# Patient Record
Sex: Male | Born: 1960 | Race: White | Hispanic: No | Smoking: Former smoker
Health system: Southern US, Community
[De-identification: ages and names within clinical notes are randomized; demographics above are authoritative.]

## PROBLEM LIST (undated history)

## (undated) DIAGNOSIS — R519 Headache, unspecified: Secondary | ICD-10-CM

## (undated) DIAGNOSIS — F810 Specific reading disorder: Secondary | ICD-10-CM

## (undated) DIAGNOSIS — I10 Essential (primary) hypertension: Secondary | ICD-10-CM

## (undated) DIAGNOSIS — J449 Chronic obstructive pulmonary disease, unspecified: Secondary | ICD-10-CM

## (undated) DIAGNOSIS — I219 Acute myocardial infarction, unspecified: Secondary | ICD-10-CM

## (undated) DIAGNOSIS — Z972 Presence of dental prosthetic device (complete) (partial): Secondary | ICD-10-CM

## (undated) DIAGNOSIS — I639 Cerebral infarction, unspecified: Secondary | ICD-10-CM

## (undated) HISTORY — PX: CAROTID ENDARTERECTOMY: SUR193

## (undated) HISTORY — PX: CORONARY ARTERY BYPASS GRAFT: SHX141

---

## 2010-02-10 ENCOUNTER — Inpatient Hospital Stay: Payer: Self-pay | Admitting: Internal Medicine

## 2010-02-17 ENCOUNTER — Ambulatory Visit: Payer: Self-pay | Admitting: Vascular Surgery

## 2010-02-25 ENCOUNTER — Ambulatory Visit: Payer: Self-pay | Admitting: Vascular Surgery

## 2010-03-02 ENCOUNTER — Inpatient Hospital Stay: Payer: Self-pay | Admitting: Vascular Surgery

## 2010-09-25 ENCOUNTER — Emergency Department: Payer: Self-pay | Admitting: Emergency Medicine

## 2010-12-18 DIAGNOSIS — I639 Cerebral infarction, unspecified: Secondary | ICD-10-CM

## 2010-12-18 HISTORY — DX: Cerebral infarction, unspecified: I63.9

## 2011-09-03 ENCOUNTER — Emergency Department (HOSPITAL_COMMUNITY)
Admission: EM | Admit: 2011-09-03 | Discharge: 2011-09-04 | Disposition: A | Discharge status: Other Acute Inpatient Hospital | Payer: Commercial Managed Care - PPO | Attending: Emergency Medicine | Admitting: Emergency Medicine

## 2011-09-03 DIAGNOSIS — I10 Essential (primary) hypertension: Secondary | ICD-10-CM | POA: Insufficient documentation

## 2011-09-03 DIAGNOSIS — F1010 Alcohol abuse, uncomplicated: Secondary | ICD-10-CM | POA: Insufficient documentation

## 2011-09-03 LAB — RAPID URINE DRUG SCREEN, HOSP PERFORMED
Amphetamines: NOT DETECTED
Barbiturates: NOT DETECTED
Benzodiazepines: NOT DETECTED
Cocaine: NOT DETECTED
Opiates: NOT DETECTED
Tetrahydrocannabinol: NOT DETECTED

## 2011-09-03 LAB — COMPREHENSIVE METABOLIC PANEL
ALT: 69 U/L — ABNORMAL HIGH (ref 0–53)
Albumin: 3.5 g/dL (ref 3.5–5.2)
Alkaline Phosphatase: 132 U/L — ABNORMAL HIGH (ref 39–117)
Calcium: 9.3 mg/dL (ref 8.4–10.5)
GFR calc Af Amer: >60 mL/min (ref >60)
Glucose, Bld: 107 mg/dL — ABNORMAL HIGH (ref 70–99)
Potassium: 5 mEq/L (ref 3.5–5.1)
Sodium: 137 mEq/L (ref 135–145)
Total Protein: 8.1 g/dL (ref 6.0–8.3)

## 2011-09-03 LAB — DIFFERENTIAL
Basophils Absolute: 0.1 10*3/uL (ref 0.0–0.1)
Eosinophils Absolute: 0.2 10*3/uL (ref 0.0–0.7)
Lymphs Abs: 1.7 10*3/uL (ref 0.7–4.0)
Monocytes Absolute: 1.5 10*3/uL — ABNORMAL HIGH (ref 0.1–1.0)
Monocytes Relative: 13 % — ABNORMAL HIGH (ref 3–12)
Neutro Abs: 7.7 10*3/uL (ref 1.7–7.7)

## 2011-09-03 LAB — CBC
MCH: 35.7 pg — ABNORMAL HIGH (ref 26.0–34.0)
MCHC: 36.4 g/dL — ABNORMAL HIGH (ref 30.0–36.0)
Platelets: 255 10*3/uL (ref 150–400)
RDW: 14.4 % (ref 11.5–15.5)

## 2011-09-04 ENCOUNTER — Inpatient Hospital Stay (HOSPITAL_COMMUNITY)
Admission: AD | Admit: 2011-09-04 | Discharge: 2011-09-07 | DRG: 897 | Disposition: A | Discharge status: Home / Self Care | Payer: Commercial Managed Care - PPO | Source: Ambulatory Visit | Attending: Psychiatry | Admitting: Psychiatry

## 2011-09-04 DIAGNOSIS — F17200 Nicotine dependence, unspecified, uncomplicated: Secondary | ICD-10-CM

## 2011-09-04 DIAGNOSIS — K219 Gastro-esophageal reflux disease without esophagitis: Secondary | ICD-10-CM

## 2011-09-04 DIAGNOSIS — Z79899 Other long term (current) drug therapy: Secondary | ICD-10-CM

## 2011-09-04 DIAGNOSIS — F1020 Alcohol dependence, uncomplicated: Secondary | ICD-10-CM

## 2011-09-04 DIAGNOSIS — I10 Essential (primary) hypertension: Secondary | ICD-10-CM

## 2011-09-14 NOTE — Discharge Summary (Signed)
[name], Calderon                ACCOUNT NO.:  [id number]  MEDICAL RECORD NO.:  [id number]  LOCATION:  0300                          FACILITY:  [facility]  PHYSICIAN:  Orson Aloe, MD       DATE OF BIRTH:  1961-05-03  DATE OF ADMISSION:  09/04/2011 DATE OF DISCHARGE:  09/07/2011                              DISCHARGE SUMMARY   IDENTIFYING INFORMATION:  This is a 50 year old Caucasian male.  This is a voluntary admission.  HISTORY OF PRESENT ILLNESS:  [name] presents after a friend encouraged him to come to help for his alcohol use.  He was getting diaphoretic at night had stopped eating and was substituting alcohol for food.  He reported using alcohol for the past 8 years with his use gradually escalating.  He says that he awakens in the middle of the night shaky and diaphoretic and has to drink to keep the night sweats and chills off.  Admitted that he needed help, said he was drinking around 12 beers or more most every day.  Also having trouble affording the amount of alcohol it took to keep the withdrawal symptoms away.  MEDICAL EVALUATION:  He was medically cleared in the emergency room where he was found to have a temperature of 98, pulse 116, blood pressure 184/123.  He reported a history of hypertension and had what he reported as a mini stroke at some point in the past.  His urine drug screen was negative for all substances.  Chemistry:  Normal.  Liver enzymes mildly elevated with SGOT 100, SGPT 69.  Alcohol screen was negative.  CBC with mildly elevated WBC 11.2, hemoglobin 19.4, MCV 98.2, platelets 255,000.  He denied any suicidal thoughts in the emergency room.  COURSE OF HOSPITALIZATION:  He was admitted to our detox unit and presented disheveled, flushed and in full detox.  He was started on Librium protocol with a goal of a safe detox in 4 days and was started on metoprolol 25 mg q.a.m. and q.h.s. to control his high blood pressure.  He tolerated the detox program  well and his detox was uneventful.  While on our unit his blood pressure gradually decreased and is 144/95 to 147/101 while on our unit, pulse ranging from 76-89. By the 20th he was fully detoxed and ready to leave.  PLAN:  Follow up at [facility] in [place] on September 12, 2011 at 3:00 p.m. with Dr. Maisie Fus for group therapy.  He will also attend 90 AA meetings in 90 days. He is to follow up with his primary medical doctor within 2 weeks for blood pressure check and medication evaluation for high blood pressure.  DISCHARGE DIAGNOSIS:  Axis I:  Alcohol dependence. Axis II: Deferred. Axis III: Hypertension, history of GERD. Axis IV: Moderate psychosocial issues related to substance abuse. Axis V: Current 55 past year not known.  DISCHARGE CONDITION:  Stable and improved.  DISCHARGE MEDICATIONS: 1. Metoprolol 25 mg q.a.m. and q.h.s. for high blood pressure 2. Multivitamin 1 tablet daily for vitamin supplementation. 3. Thiamine 100 mg daily for vitamin B1 supplementation. 4. Zantac 150 mg 1 tablet daily as needed for heartburn.  Margaret A. Lorin Picket, N.P.   ______________________________ Orson Aloe, MD    MAS/MEDQ  D:  09/07/2011  T:  09/07/2011  Job:  [id number]  Electronically Signed by Kari Baars N.P. on 09/13/2011 05:05:28 PM Electronically Signed by Orson Aloe  on 09/14/2011 10:53:29 AM

## 2011-09-21 ENCOUNTER — Ambulatory Visit: Payer: Self-pay | Admitting: Unknown Physician Specialty

## 2011-10-04 NOTE — Assessment & Plan Note (Signed)
[name], [name]                ACCOUNT NO.:  [id number]  MEDICAL RECORD NO.:  [id number]  LOCATION:  0300                          FACILITY:  [facility]  PHYSICIAN:  Orson Aloe, MD       DATE OF BIRTH:  September 13, 1961  DATE OF ADMISSION:  09/04/2011 DATE OF DISCHARGE:  09/07/2011                      PSYCHIATRIC ADMISSION ASSESSMENT   IDENTIFYING INFORMATION:  This is a 50 year old male.  This is a voluntary admission.  HISTORY OF PRESENT ILLNESS:  [name] presents after a friend encouraged him to come for help.  He knew that he was drinking too much alcohol and was finding himself getting diaphoretic at night; was not eating regularly; admits to substituting alcohol for meals and is requesting detox from alcohol.  He feels that his alcohol use is out of control, denying any suicidal thoughts.  He finds himself waking up, having to drink in the morning to calm down;  gets diaphoretic at night when he cannot drink.  This disrupts his sleep.  He has been drinking beer for about six years now with gradual escalation and use.  Currently, he is not attending A.A. meetings or getting outpatient treatment.  PAST PSYCHIATRIC HISTORY:  He has received no previous treatment for alcohol whatsoever; no history of previous psychiatric admissions.  SOCIAL HISTORY:  Currently is employed and endorses some co-worker conflicts at work.  Says his money is tight, but he is able to get by on his current income.  He separated from his wife in 2004, divorced in 2006, and his two children, age 25 and 36.  Endorses a family history of his father intoxicated on alcohol much of his life.  Completed education through the eighth grade.  He is employed in [employer] and reports drinking about 18 beers daily.  Most recently, for at least two weeks, he is drinking constantly whenever he is not at work.  He has a history of four previous DWI charges.  MEDICAL ISSUES:  A history of hypertension, but he  is off all medications; has not been eating and drinking well.  Denies a history of seizures.  No primary care physician.  A full Review of Systems and physical exam were done in the Emergency Room.  The physical exam is noted there.  A urine drug screen was noted to be negative for all substances.  CBC:  WBC 11.2, hemoglobin of 19.4, hematocrit 53.3, and platelets 255000.  Alcohol screen was negative. Chemistry revealed normal chemistry; BUN 6, creatinine 0.79.  Liver enzymes elevated:  SGOT 100, SGPT 69, alkaline phosphatase 132, and total bilirubin 1.4.  Random glucose 107.  The patient was one labetalol in the Emergency Room to control his blood pressure.  MENTAL STATUS EXAM:  A fully alert male, pleasant, cooperative, denying any dangerous ideas.  Thinking is logical; no delusional statements; recognizes that alcohol is affecting his life.  He cannot function on it any more.  Insight is adequate; impulse control and judgment normal.  ADMITTING DIAGNOSIS:  Axis I:  Alcohol dependence. Axis II:  No diagnosis. Axis III:  Hypertension. Axis IV:  Moderate social stressors due to his drinking and moderate functional impairment, also due to drinking. Axis V:  Current 43; past year, not known.  The plan is to voluntarily admit him.  We are going to start him on metoprolol 25 mg q.a.m. and q.h.s. for his hypertension.  He is also started on a Librium detox protocol with the goal of a safe detox in four days.     Margaret A. Lorin Picket, N.P.   ______________________________ Orson Aloe, MD    MAS/MEDQ  D:  10/02/2011  T:  10/02/2011  Job:  [id number]  Electronically Signed by Kari Baars N.P. on 10/03/2011 12:53:55 PM Electronically Signed by Orson Aloe  on 10/04/2011 02:53:42 PM

## 2011-10-19 ENCOUNTER — Ambulatory Visit: Payer: Self-pay | Admitting: Unknown Physician Specialty

## 2012-05-09 DIAGNOSIS — I219 Acute myocardial infarction, unspecified: Secondary | ICD-10-CM

## 2012-05-09 HISTORY — DX: Acute myocardial infarction, unspecified: I21.9

## 2012-05-10 HISTORY — PX: CORONARY ARTERY BYPASS GRAFT: SHX141

## 2013-02-05 ENCOUNTER — Emergency Department: Payer: Self-pay | Admitting: Emergency Medicine

## 2013-02-05 LAB — COMPREHENSIVE METABOLIC PANEL WITH GFR
Albumin: 3.8 g/dL
Alkaline Phosphatase: 83 U/L
Anion Gap: 9
BUN: 4 mg/dL — ABNORMAL LOW
Bilirubin,Total: 0.4 mg/dL
Calcium, Total: 8.4 mg/dL — ABNORMAL LOW
Chloride: 104 mmol/L
Co2: 26 mmol/L
Creatinine: 0.81 mg/dL
EGFR (African American): >60
EGFR (Non-African Amer.): >60
Glucose: 127 mg/dL — ABNORMAL HIGH
Osmolality: 276
Potassium: 3.7 mmol/L
SGOT(AST): 63 U/L — ABNORMAL HIGH
SGPT (ALT): 91 U/L — ABNORMAL HIGH
Sodium: 139 mmol/L
Total Protein: 8.2 g/dL

## 2013-02-05 LAB — URINALYSIS, COMPLETE
Bacteria: NONE SEEN
Bilirubin,UR: NEGATIVE
Blood: NEGATIVE
Glucose,UR: NEGATIVE mg/dL
Hyaline Cast: 1
Ketone: NEGATIVE
Leukocyte Esterase: NEGATIVE
Nitrite: NEGATIVE
Ph: 5
Protein: NEGATIVE
RBC,UR: NONE SEEN /HPF
Specific Gravity: 1.01
Squamous Epithelial: <1
WBC UR: <1 /HPF

## 2013-02-05 LAB — CK TOTAL AND CKMB (NOT AT ARMC): CK-MB: 2 ng/mL (ref 0.5–3.6)

## 2013-02-05 LAB — TROPONIN I: Troponin-I: <0.02 ng/mL

## 2013-02-05 LAB — CBC
HCT: 52.3 % — ABNORMAL HIGH (ref 40.0–52.0)
HGB: 17.2 g/dL (ref 13.0–18.0)
MCV: 102 fL — ABNORMAL HIGH (ref 80–100)
Platelet: 263 10*3/uL (ref 150–440)
RDW: 15.3 % — ABNORMAL HIGH (ref 11.5–14.5)

## 2013-02-05 LAB — PROTIME-INR: INR: 0.9

## 2013-06-10 ENCOUNTER — Emergency Department: Payer: Self-pay | Admitting: Emergency Medicine

## 2013-06-10 LAB — URINALYSIS, COMPLETE
Bacteria: NONE SEEN
Bilirubin,UR: NEGATIVE
Glucose,UR: NEGATIVE mg/dL (ref 0–75)
Leukocyte Esterase: NEGATIVE
Protein: 30
Squamous Epithelial: NONE SEEN

## 2013-06-10 LAB — BASIC METABOLIC PANEL
Calcium, Total: 8.8 mg/dL (ref 8.5–10.1)
Chloride: 102 mmol/L (ref 98–107)
EGFR (African American): >60
Potassium: 4.2 mmol/L (ref 3.5–5.1)

## 2013-06-10 LAB — CBC WITH DIFFERENTIAL/PLATELET
Eosinophil #: 0.1 10*3/uL (ref 0.0–0.7)
Eosinophil %: 1 %
Lymphocyte #: 1.4 10*3/uL (ref 1.0–3.6)
MCH: 35 pg — ABNORMAL HIGH (ref 26.0–34.0)
MCHC: 34.5 g/dL (ref 32.0–36.0)
MCV: 102 fL — ABNORMAL HIGH (ref 80–100)
Monocyte #: 1.4 x10 3/mm — ABNORMAL HIGH (ref 0.2–1.0)
Monocyte %: 10.2 %
Neutrophil %: 77.7 %
WBC: 13.8 10*3/uL — ABNORMAL HIGH (ref 3.8–10.6)

## 2013-12-02 LAB — DRUG SCREEN, URINE
Amphetamines, Ur Screen: NEGATIVE (ref ?–1000)
Benzodiazepine, Ur Scrn: NEGATIVE (ref ?–200)
Cannabinoid 50 Ng, Ur ~~LOC~~: NEGATIVE (ref ?–50)
Cocaine Metabolite,Ur ~~LOC~~: NEGATIVE (ref ?–300)
MDMA (Ecstasy)Ur Screen: NEGATIVE (ref ?–500)
Methadone, Ur Screen: NEGATIVE (ref ?–300)
Opiate, Ur Screen: NEGATIVE (ref ?–300)
Phencyclidine (PCP) Ur S: NEGATIVE (ref ?–25)
Tricyclic, Ur Screen: NEGATIVE (ref ?–1000)

## 2013-12-02 LAB — URINALYSIS, COMPLETE
Bilirubin,UR: NEGATIVE
Blood: NEGATIVE
Glucose,UR: NEGATIVE mg/dL (ref 0–75)
Leukocyte Esterase: NEGATIVE
Ph: 5 (ref 4.5–8.0)
Protein: NEGATIVE
RBC,UR: NONE SEEN /HPF (ref 0–5)
Specific Gravity: 1.003 (ref 1.003–1.030)
Squamous Epithelial: NONE SEEN
WBC UR: NONE SEEN /HPF (ref 0–5)

## 2013-12-02 LAB — COMPREHENSIVE METABOLIC PANEL
Albumin: 3.5 g/dL (ref 3.4–5.0)
Anion Gap: 3 — ABNORMAL LOW (ref 7–16)
BUN: 4 mg/dL — ABNORMAL LOW (ref 7–18)
Co2: 29 mmol/L (ref 21–32)
Osmolality: 270 (ref 275–301)
Potassium: 3.8 mmol/L (ref 3.5–5.1)
SGPT (ALT): 111 U/L — ABNORMAL HIGH (ref 12–78)
Sodium: 137 mmol/L (ref 136–145)
Total Protein: 7.8 g/dL (ref 6.4–8.2)

## 2013-12-02 LAB — CBC
HGB: 16.4 g/dL (ref 13.0–18.0)
MCHC: 34.6 g/dL (ref 32.0–36.0)
RDW: 14.1 % (ref 11.5–14.5)

## 2013-12-02 LAB — ETHANOL
Ethanol %: 0.116 % — ABNORMAL HIGH (ref 0.000–0.080)
Ethanol: 284 mg/dL

## 2013-12-03 ENCOUNTER — Inpatient Hospital Stay: Payer: Self-pay | Admitting: Psychiatry

## 2013-12-03 LAB — LIPID PANEL
HDL Cholesterol: 49 mg/dL (ref 40–60)
Triglycerides: 166 mg/dL (ref 0–200)

## 2013-12-03 LAB — HEMOGLOBIN A1C: Hemoglobin A1C: 5.3 % (ref 4.2–6.3)

## 2013-12-04 DIAGNOSIS — I517 Cardiomegaly: Secondary | ICD-10-CM

## 2013-12-27 ENCOUNTER — Emergency Department: Payer: Self-pay | Admitting: Emergency Medicine

## 2013-12-27 LAB — CBC
HCT: 43.9 % (ref 40.0–52.0)
HGB: 14.7 g/dL (ref 13.0–18.0)
MCH: 33.3 pg (ref 26.0–34.0)
MCHC: 33.5 g/dL (ref 32.0–36.0)
MCV: 99 fL (ref 80–100)
Platelet: 298 10*3/uL (ref 150–440)
RBC: 4.41 10*6/uL (ref 4.40–5.90)
RDW: 14 % (ref 11.5–14.5)
WBC: 11.8 10*3/uL — ABNORMAL HIGH (ref 3.8–10.6)

## 2013-12-27 LAB — URINALYSIS, COMPLETE
BACTERIA: NONE SEEN
BILIRUBIN, UR: NEGATIVE
Blood: NEGATIVE
Glucose,UR: NEGATIVE mg/dL (ref 0–75)
Ketone: NEGATIVE
LEUKOCYTE ESTERASE: NEGATIVE
NITRITE: NEGATIVE
Ph: 6 (ref 4.5–8.0)
Protein: 100
SPECIFIC GRAVITY: 1.017 (ref 1.003–1.030)
Squamous Epithelial: 2

## 2013-12-27 LAB — COMPREHENSIVE METABOLIC PANEL
ALBUMIN: 3.3 g/dL — AB (ref 3.4–5.0)
ALK PHOS: 61 U/L
ALT: 58 U/L (ref 12–78)
ANION GAP: 7 (ref 7–16)
BILIRUBIN TOTAL: 0.4 mg/dL (ref 0.2–1.0)
BUN: 6 mg/dL — AB (ref 7–18)
CALCIUM: 7.8 mg/dL — AB (ref 8.5–10.1)
CHLORIDE: 104 mmol/L (ref 98–107)
CO2: 27 mmol/L (ref 21–32)
Creatinine: 1.23 mg/dL (ref 0.60–1.30)
EGFR (Non-African Amer.): >60
Glucose: 95 mg/dL (ref 65–99)
OSMOLALITY: 273 (ref 275–301)
POTASSIUM: 4.1 mmol/L (ref 3.5–5.1)
SGOT(AST): 28 U/L (ref 15–37)
SODIUM: 138 mmol/L (ref 136–145)
TOTAL PROTEIN: 6.5 g/dL (ref 6.4–8.2)

## 2013-12-27 LAB — DRUG SCREEN, URINE
Amphetamines, Ur Screen: NEGATIVE (ref ?–1000)
Barbiturates, Ur Screen: NEGATIVE (ref ?–200)
Benzodiazepine, Ur Scrn: NEGATIVE (ref ?–200)
COCAINE METABOLITE, UR ~~LOC~~: NEGATIVE (ref ?–300)
Cannabinoid 50 Ng, Ur ~~LOC~~: NEGATIVE (ref ?–50)
MDMA (ECSTASY) UR SCREEN: NEGATIVE (ref ?–500)
METHADONE, UR SCREEN: NEGATIVE (ref ?–300)
OPIATE, UR SCREEN: NEGATIVE (ref ?–300)
Phencyclidine (PCP) Ur S: NEGATIVE (ref ?–25)
Tricyclic, Ur Screen: NEGATIVE (ref ?–1000)

## 2013-12-27 LAB — ETHANOL
ETHANOL %: 0.231 % — AB (ref 0.000–0.080)
ETHANOL LVL: 231 mg/dL

## 2013-12-27 LAB — CK TOTAL AND CKMB (NOT AT ARMC)
CK, TOTAL: 47 U/L (ref 35–232)
CK-MB: 1 ng/mL (ref 0.5–3.6)

## 2013-12-27 LAB — TROPONIN I
Troponin-I: <0.02 ng/mL
Troponin-I: <0.02 ng/mL

## 2013-12-27 LAB — AMMONIA: AMMONIA, PLASMA: 24 umol/L (ref 11–32)

## 2014-08-19 ENCOUNTER — Emergency Department: Payer: Self-pay | Admitting: Emergency Medicine

## 2014-09-09 ENCOUNTER — Emergency Department: Payer: Self-pay | Admitting: Emergency Medicine

## 2014-09-09 LAB — CBC
HCT: 51.6 % (ref 40.0–52.0)
HGB: 17.5 g/dL (ref 13.0–18.0)
MCH: 33.7 pg (ref 26.0–34.0)
MCHC: 34 g/dL (ref 32.0–36.0)
MCV: 99 fL (ref 80–100)
PLATELETS: 228 10*3/uL (ref 150–440)
RBC: 5.2 10*6/uL (ref 4.40–5.90)
RDW: 13.8 % (ref 11.5–14.5)
WBC: 9.1 10*3/uL (ref 3.8–10.6)

## 2014-09-09 LAB — COMPREHENSIVE METABOLIC PANEL
ALT: 60 U/L
AST: 47 U/L — AB (ref 15–37)
Albumin: 3.8 g/dL (ref 3.4–5.0)
Alkaline Phosphatase: 66 U/L
Anion Gap: 7 (ref 7–16)
BUN: 7 mg/dL (ref 7–18)
Bilirubin,Total: 0.4 mg/dL (ref 0.2–1.0)
CO2: 29 mmol/L (ref 21–32)
Calcium, Total: 8.4 mg/dL — ABNORMAL LOW (ref 8.5–10.1)
Chloride: 102 mmol/L (ref 98–107)
Creatinine: 0.7 mg/dL (ref 0.60–1.30)
Glucose: 84 mg/dL (ref 65–99)
OSMOLALITY: 273 (ref 275–301)
POTASSIUM: 4 mmol/L (ref 3.5–5.1)
Sodium: 138 mmol/L (ref 136–145)
TOTAL PROTEIN: 8.1 g/dL (ref 6.4–8.2)

## 2014-09-09 LAB — DRUG SCREEN, URINE
AMPHETAMINES, UR SCREEN: NEGATIVE (ref ?–1000)
BARBITURATES, UR SCREEN: NEGATIVE (ref ?–200)
Benzodiazepine, Ur Scrn: NEGATIVE (ref ?–200)
CANNABINOID 50 NG, UR ~~LOC~~: NEGATIVE (ref ?–50)
COCAINE METABOLITE, UR ~~LOC~~: NEGATIVE (ref ?–300)
MDMA (Ecstasy)Ur Screen: NEGATIVE (ref ?–500)
Methadone, Ur Screen: NEGATIVE (ref ?–300)
Opiate, Ur Screen: NEGATIVE (ref ?–300)
Phencyclidine (PCP) Ur S: NEGATIVE (ref ?–25)
Tricyclic, Ur Screen: NEGATIVE (ref ?–1000)

## 2014-09-09 LAB — URINALYSIS, COMPLETE
BLOOD: NEGATIVE
Bacteria: NONE SEEN
Bilirubin,UR: NEGATIVE
Glucose,UR: NEGATIVE mg/dL (ref 0–75)
KETONE: NEGATIVE
Leukocyte Esterase: NEGATIVE
NITRITE: NEGATIVE
Ph: 5 (ref 4.5–8.0)
Protein: NEGATIVE
RBC,UR: NONE SEEN /HPF (ref 0–5)
SQUAMOUS EPITHELIAL: NONE SEEN
Specific Gravity: 1.011 (ref 1.003–1.030)

## 2014-09-09 LAB — SALICYLATE LEVEL: SALICYLATES, SERUM: 2.8 mg/dL

## 2014-09-09 LAB — ACETAMINOPHEN LEVEL

## 2014-09-09 LAB — ETHANOL: Ethanol: 110 mg/dL

## 2014-12-02 ENCOUNTER — Emergency Department: Payer: Self-pay | Admitting: Emergency Medicine

## 2014-12-02 LAB — BASIC METABOLIC PANEL
ANION GAP: 7 (ref 7–16)
BUN: 6 mg/dL — AB (ref 7–18)
CALCIUM: 8.4 mg/dL — AB (ref 8.5–10.1)
Chloride: 98 mmol/L (ref 98–107)
Co2: 29 mmol/L (ref 21–32)
Creatinine: 0.68 mg/dL (ref 0.60–1.30)
Glucose: 79 mg/dL (ref 65–99)
Osmolality: 265 (ref 275–301)
Potassium: 3.7 mmol/L (ref 3.5–5.1)
Sodium: 134 mmol/L — ABNORMAL LOW (ref 136–145)

## 2014-12-02 LAB — CBC
HCT: 49 % (ref 40.0–52.0)
HGB: 16.2 g/dL (ref 13.0–18.0)
MCH: 33 pg (ref 26.0–34.0)
MCHC: 33.1 g/dL (ref 32.0–36.0)
MCV: 100 fL (ref 80–100)
Platelet: 196 10*3/uL (ref 150–440)
RBC: 4.92 10*6/uL (ref 4.40–5.90)
RDW: 13.9 % (ref 11.5–14.5)
WBC: 8.8 10*3/uL (ref 3.8–10.6)

## 2014-12-02 LAB — ETHANOL: ETHANOL LVL: 12 mg/dL

## 2014-12-02 LAB — TROPONIN I: Troponin-I: <0.02 ng/mL

## 2015-02-10 ENCOUNTER — Emergency Department: Payer: Self-pay | Admitting: Emergency Medicine

## 2015-02-17 ENCOUNTER — Emergency Department: Payer: Self-pay | Admitting: Emergency Medicine

## 2015-04-09 NOTE — H&P (Signed)
PATIENT NAME:  [name], [name] MR#:  [id number] DATE OF BIRTH:  1961-10-02  DATE OF ADMISSION:  12/02/2013  IDENTIFYING INFORMATION AND CHIEF COMPLAINT: A 54 year old man presented himself to the Emergency Room, referred by his boss.   CHIEF COMPLAINT: "I've been drinking."   HISTORY OF PRESENT ILLNESS: Information obtained from the patient and the chart. The patient went to work today, where he was apparently unable to do his job because he had been drinking. He admits that he drinks on a daily basis.  It has probably been getting a little worse. He drinks the least a quart of wine and several hard lemonades and also drinks beer in the morning before he goes to work. Sleep is intermittent. Mood is kind of flat. Denies suicidal ideation although he has had some passive thoughts about dying when he is intoxicated. He tells me that he wants to stop drinking because he knows that it is making it harder for him to work and making his mood worse. Denies abuse of any other drugs.   PAST PSYCHIATRIC HISTORY: No previous psychiatric treatment. No psychiatric hospitalizations. Denies any history of suicide attempts. Denies any history of psychiatric medication. He says that he has tried to go to outpatient substance abuse treatment but he gets too anxious being around groups of other people. He apparently has not gotten any treatment for an anxiety disorder. Denies other substance abuse.   SOCIAL HISTORY: He lives alone. He and his wife separated within the last year. He works as a Freight forwarder. Sounds fairly lonely, does not have a whole lot of other social activity. All the rest of his family are deceased. Adult children do not stay in touch with him.   PAST MEDICAL HISTORY: He tells me he had a heart attack at one point in the past. He also has chronic shortness of breath and wheezing but does not get any treatment for it.   SUBSTANCE ABUSE HISTORY: Alcohol use that has been up and down, recently  escalating. Says that he may have had a seizure one time in the past, he is kind of unclear about it. He does not really seem to have ever had delirium tremens. Says he is not abusing any other drugs.   FAMILY HISTORY: No family history identified.   CURRENT MEDICATIONS: None.   ALLERGIES: No known drug allergies.   REVIEW OF SYSTEMS: Feeling a little bit tired and run down. Mildly shaky. Anxious. Not overwhelmingly depressed. No suicidal or homicidal ideation. No hallucinations. No other acute physical symptoms.   MENTAL STATUS EXAMINATION:  The patient was alert and cooperative. Eye contact good. Psychomotor activity a little bit slow. Speech a little bit slow. Thoughts appear to be a little bit slow as well. Speech otherwise easy to understand. Affect blunted. Mood stated as nervous. Thoughts are lucid but again slow. No delusions or bizarre statements. Does not appear to be hallucinating. Denies suicidal or homicidal ideation. Alert and oriented x 4. Shows reasonably good judgment and insight.   PHYSICAL EXAMINATION: SKIN: No acute skin lesions.  HEENT: Pupils equal and reactive. Face symmetric.  MUSCULOSKELETAL:  Gait within normal limits. Full range of motion at all extremities.  NECK AND BACK: Nontender.  NEURO:  Strength and reflexes normal and symmetric throughout. Cranial nerves symmetric and normal.  LUNGS: Diffuse light wheezing throughout.  HEART: Regular rate and rhythm.  ABDOMEN: Soft, nontender, normal bowel sounds.  VITAL SIGNS: Temperature 98.9, pulse most recently 94, blood pressure 150/90, respirations 20.  LABORATORY RESULTS: His last alcohol level was last night at about 10:00 and was 116. Salicylates slightly up but not toxic at 3.8. Chemistry shows an AST and ALT mildly elevated. CBC unremarkable. Urinalysis normal. Drug screen negative.   ASSESSMENT: A 54 year old man who appears to have alcohol dependence. Possible history of seizure in the past. Significant impact  on his social life and his work life. Has been unable to stop with outpatient treatment. Also describes what probably sounds like social anxiety disorder chronically. Reasonable candidate for admission for detox to improve chances of sobriety.   TREATMENT PLAN: Admit to psychiatry. Detox orders in place. Medication added to treat his blood pressure since he said he was supposed to be on that. He has never had his COPD treated so I am going to put in a p.r.n. albuterol but also ask the internal medicine doctors for advice about managing it. I am going to start him on Prozac because I think he probably does have social anxiety disorder. Will engage him in the groups and individual counseling.   DIAGNOSIS, PRINCIPAL AND PRIMARY:  AXIS I: Alcohol dependence.   SECONDARY DIAGNOSES: AXIS I: Social anxiety disorder.  AXIS II: Deferred.  AXIS III: High blood pressure, alcohol withdrawal.  AXIS IV: Severe from the effect here on his health and social life.  AXIS V: Functioning at time of evaluation 35.     ____________________________ Gonzella Lex, MD jtc:cs D: 12/03/2013 17:48:36 ET T: 12/03/2013 18:41:11 ET JOB#: [id number]  cc: Gonzella Lex, MD, <Dictator> Gonzella Lex MD ELECTRONICALLY SIGNED 12/04/2013 10:39

## 2015-04-09 NOTE — Consult Note (Signed)
Brief Consult Note: Diagnosis: COPD exacerbation, CAD, High Blood Pressure (Hypertension).   Patient was seen by consultant.   Consult note dictated.   Orders entered.   Discussed with Attending MD.   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 Medical consult requested for wheezing and dsypnea  * Acute on chronic COPD exacerbation- start prednisone taper, symbicort, spiriva for now check o2 sats  * CAD status post CABG- not taking any meds at home start on asa, coreg, statin will get ECHO to see EF  * Ac abuse- CIWA and detox per psych  * Tobacco abuse diosrder- counselled for 33min, smokes 3ppd, nicotral inh  * HTN- will start meds.  Electronic Signatures: Gladstone Lighter (MD)  (Signed 17-Dec-14 18:05)  Authored: Brief Consult Note   Last Updated: 17-Dec-14 18:05 by Gladstone Lighter (MD)

## 2015-04-09 NOTE — Consult Note (Signed)
Consult: outpatient treatment recommendations   Patient seen as requested of Attending MD prior to this inpatient discharge in order to explore Outpatient Rehab at discharge from this admission for patient's abuse of Alcoholic beverage. Patient was positive for soberity goals in the past and verbally admitted to the need for outpatient treatment in order to develop recovery skills due to his goal of complete abstinence of addictive substances. He reported that his attendance in the [facility] CD-IOP may be problematic due to his work schedule and that he will seek to work out his schedule so that he can attend the CD-IOP. was positive for past treatment in the [facility] CD-IOP. He expressed plans of life time sober living and will report for the CD-IOP on Monday December 08, 2013 to begin the [facility] CD-IOP.   Electronic Signatures: Laqueta Due (PsyD)  (Signed on 19-Dec-14 22:21)  Authored  Last Updated: 19-Dec-14 22:21 by Laqueta Due (PsyD)

## 2015-04-09 NOTE — Consult Note (Signed)
Brief Consult Note: Diagnosis: alcohol dep.   Patient was seen by consultant.   Consult note dictated.   Recommend further assessment or treatment.   Orders entered.   Comments: Psychiatry: Admitted to inpatient unit. See full note.  Electronic Signatures: Karsten Vaughn, Madie Reno (MD)  (Signed 17-Dec-14 17:41)  Authored: Brief Consult Note   Last Updated: 17-Dec-14 17:41 by Gonzella Lex (MD)

## 2015-04-09 NOTE — Discharge Summary (Signed)
PATIENT NAME:  [name], [name] MR#:  [id number] DATE OF BIRTH:  10/15/1961  DATE OF ADMISSION:  12/03/2013 DATE OF DISCHARGE:  12/05/2013  HOSPITAL COURSE:  See dictated history and physical for details of admission.  A 54 year old man came into the hospital reporting that he had been drinking too much.  He had been drinking on a daily basis and it has been getting out of control and he had been sent here from work telling him he needed to get off the alcohol.  He was admitted voluntarily and was treated with detox protocol.  The patient did not have a seizure and does not appear to be showing any signs of delirium.  He is not tremulous and he is eating well.  He does run a very high blood pressure, which seems to probably be untreated hypertension.  As far as the substance abuse issue he has participated in groups and expressed insight into the need to stop drinking.  He has been educated about treatment for alcohol dependence.  Unfortunately his insurance is not accepted by the local provider of substance abuse services outpatient, but he may be a candidate for the intensive outpatient program here if he would agree to it.  We hope to have the director of the program speak to the patient before discharge.  If that does not work out he certainly needs to be going to [employer] regularly.  He also has been strongly advised to get a primary care doctor for follow-up.  Internal medicine consult has seen the patient.  He has high blood pressure and COPD.  He has been started on several medications, but certainly needs follow-up.  The patient is denying suicidal ideation and appears to be safe, calm and lucid.   DISCHARGE MEDICATIONS:  Prednisone taper that should go 50 mg a day for the next two days, then 40 mg a day for two days, then 30 mg a day for two days, then 20 mg a day for two days, then 10 mg a day for two days.  I have written the prescription out by hand to try to make this clear.  Lisinopril 10 mg once  a day, fluoxetine 20 mg once a day, simvastatin 20 mg at night, aspirin 81 mg once a day, carvedilol 6.25 mg 2 times a day, albuterol ipratropium inhaler 1 puff 4 times a day, budesonide formoterol 160 mcg inhaler 1 puff 2 times a day, tiotropium 18 mg inhaled capsule once a day, magnesium oxide 400 mg a day, pantoprazole 40 mg a day, hydralazine 25 mg 3 times a day.   MENTAL STATUS EXAMINATION AT DISCHARGE:  Casually dressed, neatly groomed man, looks his stated age or older.  Cooperative with the interview.  Good eye contact, normal psychomotor activity.  Speech normal rate, tone and volume.  Affect euthymic, reactive, appropriate.  Mood stated as fine.  Thoughts are lucid.  No loosening of associations.  No delusions.  Denies auditory or visual hallucinations.  Denies suicidal or homicidal ideation.  Shows improved judgment and insight.  Normal intelligence.  Alert and oriented x 4.   LABORATORY RESULTS:  Chemistries on admission included drug screen that was negative.  Chemistry panel with elevated ALT at 111 and AST at 88.  Alcohol level 284, cholesterol 207 with LDL 125.  Hemoglobin A1c normal.  CBC unremarkable.  Urinalysis unremarkable.  Echocardiogram done.  See the chart for full details, but it shows a normal ejection fraction, mild concentric left ventricular hypertrophy, but no  valve abnormalities.   DISPOSITION:  Discharge home.  We will try and get him to follow up at the IOP program.  If that cannot be arranged go to [place] and also get a primary care doctor.   DIAGNOSIS, PRINCIPAL AND PRIMARY:  AXIS I:  Alcohol dependence.   SECONDARY DIAGNOSES: AXIS I:  Social anxiety disorder.  AXIS II:  Deferred.  AXIS III:  Alcohol withdrawal, hypertension, chronic obstructive pulmonary disease, elevated cholesterol.  AXIS IV:  Severe from social isolation and recent losses.  AXIS V:  Functioning at time of discharge 46.    ____________________________ Gonzella Lex, MD jtc:ea D: 12/05/2013  13:43:37 ET T: 12/05/2013 22:47:10 ET JOB#: [id number]  cc: Gonzella Lex, MD, <Dictator> Gonzella Lex MD ELECTRONICALLY SIGNED 12/08/2013 12:35

## 2015-04-10 NOTE — Consult Note (Signed)
Brief Consult Note: Diagnosis: alcohol abuse.   Patient was seen by consultant.   Consult note dictated.   Recommend further assessment or treatment.   Comments: Psychiatry: Patient seen and chart reviewed and note dictated. REfer to SA treatment.  Electronic Signatures: Clapacs, Madie Reno (MD)  (Signed 24-Sep-15 19:35)  Authored: Brief Consult Note   Last Updated: 24-Sep-15 19:35 by Gonzella Lex (MD)
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
## 2015-04-10 NOTE — Consult Note (Signed)
PATIENT NAME:  [name], [name] MR#:  [id number] DATE OF BIRTH:  1961/11/12  DATE OF CONSULTATION:  09/10/2014  CONSULTING PHYSICIAN:  Gonzella Lex, MD  IDENTIFYING INFORMATION AND REASON FOR CONSULTATION: This is a 54 year old male with a history of alcohol abuse, who came to the hospital requesting detoxification.   CHIEF COMPLAINT: "I need help."    HISTORY OF PRESENT ILLNESS: Information obtained from the patient and the chart. The patient states that he has been drinking heavily recently. He has been drinking for years and never really has managed to stay sober for a long period, recently it has increased to where he is drinking a bottle of wine and several large size cans of beer every day. He denies that he is having any suicidal or homicidal ideation, not feeling depressed. He gives no particular reason for why his drinking is increasing. Feels like it has gotten out of control and gets in the way of his being able to work, wants to stop and get detoxed.   PAST PSYCHIATRIC HISTORY: The patient denies any history of DTs or history of seizures coming off of alcohol. He is not currently seeing anyone for psychiatric treatment. Never been in a psychiatric hospital that he can recall. No history of medication treatment or suicide attempts.   FAMILY HISTORY: Denies any family history of mental illness or substance abuse.   SOCIAL HISTORY: The patient is divorced. Lives by himself. Does work regularly. Does not have much support from any family around.   PAST MEDICAL HISTORY: History of a stroke in the past, also a history of a CABG and coronary artery disease.   CURRENT MEDICATIONS: Denies that he is taking any prescription medicines; that he used to take something for high blood pressure, but then his blood pressure started to drop low.   ALLERGIES: No known drug allergies.   REVIEW OF SYSTEMS: Denies feeling particularly shaky, not sick to his stomach. Not depressed. No suicidal ideation.  Feels a little bit sluggish and run down. Otherwise, review of systems 9 points all negative.   MENTAL STATUS EXAMINATION: Disheveled gentleman, looks older than his stated age. Passively cooperative. Eye contact good. Psychomotor activity slow. Speech slow and decreased in amount. Affect flat. Mood stated as okay. Thoughts are generally lucid, but slow, decreased in amount. Does not make any bizarre or delusional statements. Denies hallucinations. Denies suicidal or homicidal ideation. He could recall 3/3 objects immediately and 2/3 at 1 minute. He was alert and oriented x4. Judgment and insight seem adequate.   VITAL SIGNS: Blood pressure 176/107, respirations 18, pulse 97, temperature 98.   LABORATORY RESULTS: Chemistry panel: Slightly elevated AST at 47. Alcohol level on admission yesterday 110. Drug screen all negative. CBC is unremarkable. Urinalysis is unremarkable.   ASSESSMENT: A 54 year old man with alcohol abuse. No history of seizures or delirium tremens.  Mildly depressed and anxious. Has been doing poorly at work. Seeking some kind of substance abuse treatment. Does not necessarily meet criteria for inpatient hospitalization.   TREATMENT PLAN: The patient can be referred to the alcohol and drug abuse treatment center or other facilities covered by his insurance tomorrow. If we can find a place for him in the next day, we can discharge him.  Otherwise, he may able to just go home at this point. Continue CIWA protocol and current medications.  Continue current assessment.   DIAGNOSIS, PRINCIPAL AND PRIMARY:  AXIS I: Alcohol abuse.   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  AXIS II: Deferred.   AXIS III: High blood pressure, history of coronary artery disease.   ____________________________ Gonzella Lex, MD jtc:lr D: 09/10/2014 19:40:53 ET T: 09/10/2014 20:38:30 ET JOB#: [id number]  cc: Gonzella Lex, MD, <Dictator> Gonzella Lex  MD ELECTRONICALLY SIGNED 09/11/2014 23:13

## 2015-04-18 NOTE — Consult Note (Signed)
PATIENT NAME:  [name], [name] MR#:  [id number] DATE OF BIRTH:  1961-02-21  DATE OF CONSULTATION:  02/11/2015  REFERRING PHYSICIAN:   CONSULTING PHYSICIAN:  Gonzella Lex, MD   IDENTIFYING INFORMATION AND REASON FOR CONSULT:  This is a 54 year old man who came voluntarily to the Emergency Room.   CHIEF COMPLAINT: "I need to stop drinking."   HISTORY OF PRESENT ILLNESS: Information obtained from the patient and the chart. The patient presented to the Emergency Room yesterday very intoxicated, saying that he has decided that he needs to stop drinking.  He says he has been drinking about the same amount as usual, which is 4 to 5 very large-sized beers a day. He is not using any other drugs.  He is still managing to work, doing some painting on a regular basis. He denies that he is having any symptoms of depression. Denies suicidal or homicidal ideation. Denies any hallucinations. He does not present any really specific reason why he is coming in yesterday instead of any other day to stop drinking.   PAST PSYCHIATRIC HISTORY:  No history of suicide attempts or violence. He has been in the hospital several times for alcohol detoxification. No history of seizures or DTs. He has been referred to [facility] and other programs in the past, but has not been able to maintain a long term sobriety in quite a while.   FAMILY HISTORY: None known.   SOCIAL HISTORY: Lives by himself. Works doing Retail buyer.   PAST MEDICAL HISTORY: He has high blood pressure, but is not taking his medication just because he does not bother to pick it up.   CURRENT MEDICATIONS: None.   ALLERGIES: No known drug allergies.   REVIEW OF SYSTEMS: The patient denies feeling shaky or nauseous or unsteady.  He denies hallucinations. No specific physical complaints right now. No mood complaints.   MENTAL STATUS EXAMINATION:  This is a reasonably well-groomed gentleman, looks his stated age, was cooperative with the interview. Eye contact  good. Psychomotor activity normal without any tremor. Speech normal rate, tone, and volume. Affect slightly blunted. Mood stated as all right. Thoughts are lucid without loosening of associations. Denies auditory or visual hallucinations. Denies suicidal or homicidal ideation. He is alert and oriented x 4. Can repeat 3 words immediately, remembers 2 of them at 3 minutes. Judgment and insight reasonably adequate. Intelligence probably average.   LABORATORY RESULTS: Alcohol level when he presented was 320.  Salicylates and acetaminophen unremarkable.  His chemistry panel is unremarkable.  CBC unremarkable. Urinalysis okay.  Drug screen is all negative.  VITAL SIGNS:  Most recent blood pressure 161/110, respirations 20, pulse 96, temperature 98.1.   ASSESSMENT: This is a 54 year old man with a history of alcohol abuse. No history of delirium tremens or seizures. Not depressed, not suicidal, not psychotic. The patient certainly needs to stop drinking, but he does not meet commitment criteria, nor does he require hospital level treatment at this point.   TREATMENT PLAN: The patient was counseled about the multiple bad health effects of continued alcohol abuse.  He was strongly encouraged to (Dictation Anomaly) <<stopMISSING TEXT>> drinking. Spent a little time doing some counseling about ways to try and maintain sobriety outside the hospital.  The patient is encouraged to finally get involved with outpatient mental health treatment, and go to [facility]. He can be discharged from the Emergency Room.   DIAGNOSIS, PRINCIPAL AND PRIMARY:  AXIS I: Alcohol dependence, severe.   SECONDARY DIAGNOSES:  AXIS I: No  further.  AXIS II: Deferred.  AXIS III: High blood pressure.      ____________________________ Gonzella Lex, MD jtc:DT D: 02/11/2015 17:47:54 ET T: 02/11/2015 18:22:49 ET JOB#: [id number]  cc: Gonzella Lex, MD, <Dictator> Gonzella Lex MD ELECTRONICALLY SIGNED 02/23/2015 10:37

## 2016-07-24 ENCOUNTER — Inpatient Hospital Stay (HOSPITAL_COMMUNITY)
Admit: 2016-07-24 | Discharge: 2016-07-24 | Disposition: A | Discharge status: Home / Self Care | Payer: Medicaid Other | Attending: Cardiovascular Disease | Admitting: Cardiovascular Disease

## 2016-07-24 ENCOUNTER — Inpatient Hospital Stay: Admit: 2016-07-24 | Payer: Medicaid Other

## 2016-07-24 ENCOUNTER — Inpatient Hospital Stay
Admission: EM | Admit: 2016-07-24 | Discharge: 2016-07-29 | DRG: 419 | Disposition: A | Discharge status: Home / Self Care | Payer: Medicaid Other | Attending: Surgery | Admitting: Surgery

## 2016-07-24 ENCOUNTER — Encounter: Payer: Self-pay | Admitting: Emergency Medicine

## 2016-07-24 ENCOUNTER — Emergency Department: Payer: Medicaid Other

## 2016-07-24 DIAGNOSIS — R0600 Dyspnea, unspecified: Secondary | ICD-10-CM

## 2016-07-24 DIAGNOSIS — K219 Gastro-esophageal reflux disease without esophagitis: Secondary | ICD-10-CM | POA: Diagnosis present

## 2016-07-24 DIAGNOSIS — I2510 Atherosclerotic heart disease of native coronary artery without angina pectoris: Secondary | ICD-10-CM | POA: Diagnosis present

## 2016-07-24 DIAGNOSIS — Z9114 Patient's other noncompliance with medication regimen: Secondary | ICD-10-CM

## 2016-07-24 DIAGNOSIS — J449 Chronic obstructive pulmonary disease, unspecified: Secondary | ICD-10-CM | POA: Diagnosis present

## 2016-07-24 DIAGNOSIS — Z951 Presence of aortocoronary bypass graft: Secondary | ICD-10-CM | POA: Diagnosis not present

## 2016-07-24 DIAGNOSIS — F17210 Nicotine dependence, cigarettes, uncomplicated: Secondary | ICD-10-CM | POA: Diagnosis present

## 2016-07-24 DIAGNOSIS — I10 Essential (primary) hypertension: Secondary | ICD-10-CM | POA: Diagnosis present

## 2016-07-24 DIAGNOSIS — I25810 Atherosclerosis of coronary artery bypass graft(s) without angina pectoris: Secondary | ICD-10-CM | POA: Insufficient documentation

## 2016-07-24 DIAGNOSIS — I252 Old myocardial infarction: Secondary | ICD-10-CM | POA: Diagnosis not present

## 2016-07-24 DIAGNOSIS — Z8249 Family history of ischemic heart disease and other diseases of the circulatory system: Secondary | ICD-10-CM

## 2016-07-24 DIAGNOSIS — K810 Acute cholecystitis: Secondary | ICD-10-CM | POA: Diagnosis present

## 2016-07-24 DIAGNOSIS — K819 Cholecystitis, unspecified: Secondary | ICD-10-CM

## 2016-07-24 DIAGNOSIS — I69334 Monoplegia of upper limb following cerebral infarction affecting left non-dominant side: Secondary | ICD-10-CM

## 2016-07-24 DIAGNOSIS — E785 Hyperlipidemia, unspecified: Secondary | ICD-10-CM | POA: Diagnosis present

## 2016-07-24 DIAGNOSIS — Z720 Tobacco use: Secondary | ICD-10-CM | POA: Insufficient documentation

## 2016-07-24 DIAGNOSIS — Z01810 Encounter for preprocedural cardiovascular examination: Secondary | ICD-10-CM

## 2016-07-24 HISTORY — DX: Acute myocardial infarction, unspecified: I21.9

## 2016-07-24 HISTORY — DX: Essential (primary) hypertension: I10

## 2016-07-24 HISTORY — DX: Cerebral infarction, unspecified: I63.9

## 2016-07-24 LAB — LIPASE, BLOOD: LIPASE: 29 U/L (ref 11–51)

## 2016-07-24 LAB — COMPREHENSIVE METABOLIC PANEL
ALBUMIN: 4.3 g/dL (ref 3.5–5.0)
ALK PHOS: 63 U/L (ref 38–126)
ALT: 10 U/L — AB (ref 17–63)
ANION GAP: 8 (ref 5–15)
AST: 16 U/L (ref 15–41)
BUN: 11 mg/dL (ref 6–20)
CALCIUM: 9.1 mg/dL (ref 8.9–10.3)
CO2: 30 mmol/L (ref 22–32)
CREATININE: 1.09 mg/dL (ref 0.61–1.24)
Chloride: 99 mmol/L — ABNORMAL LOW (ref 101–111)
GFR calc Af Amer: >60 mL/min (ref >60)
GFR calc non Af Amer: >60 mL/min (ref >60)
Glucose, Bld: 100 mg/dL — ABNORMAL HIGH (ref 65–99)
Potassium: 3.9 mmol/L (ref 3.5–5.1)
SODIUM: 137 mmol/L (ref 135–145)
Total Bilirubin: 0.3 mg/dL (ref 0.3–1.2)
Total Protein: 7.9 g/dL (ref 6.5–8.1)

## 2016-07-24 LAB — CBC WITH DIFFERENTIAL/PLATELET
BASOS PCT: 1 %
Basophils Absolute: 0.2 10*3/uL — ABNORMAL HIGH (ref 0–0.1)
EOS ABS: 0.5 10*3/uL (ref 0–0.7)
EOS PCT: 4 %
HCT: 47.6 % (ref 40.0–52.0)
HEMOGLOBIN: 16.5 g/dL (ref 13.0–18.0)
Lymphocytes Relative: 25 %
Lymphs Abs: 3.7 10*3/uL — ABNORMAL HIGH (ref 1.0–3.6)
MCH: 30.4 pg (ref 26.0–34.0)
MCHC: 34.8 g/dL (ref 32.0–36.0)
MCV: 87.5 fL (ref 80.0–100.0)
Monocytes Absolute: 1.7 10*3/uL — ABNORMAL HIGH (ref 0.2–1.0)
Monocytes Relative: 12 %
NEUTROS PCT: 58 %
Neutro Abs: 8.7 10*3/uL — ABNORMAL HIGH (ref 1.4–6.5)
PLATELETS: 233 10*3/uL (ref 150–440)
RBC: 5.43 MIL/uL (ref 4.40–5.90)
RDW: 14.9 % — ABNORMAL HIGH (ref 11.5–14.5)
WBC: 14.9 10*3/uL — AB (ref 3.8–10.6)

## 2016-07-24 LAB — TROPONIN I

## 2016-07-24 MED ORDER — CEFAZOLIN IN D5W 1 GM/50ML IV SOLN
1.0000 g | Freq: Four times a day (QID) | INTRAVENOUS | Status: DC
  Administered 2016-07-24 – 2016-07-29 (×18): 1 g via INTRAVENOUS
  Filled 2016-07-24 (×25): qty 50

## 2016-07-24 MED ORDER — ONDANSETRON HCL 4 MG/2ML IJ SOLN: 4.0000 mg | Freq: Four times a day (QID) | INTRAMUSCULAR | Status: DC | PRN

## 2016-07-24 MED ORDER — NICOTINE 21 MG/24HR TD PT24
21.0000 mg | MEDICATED_PATCH | Freq: Every day | TRANSDERMAL | Status: DC
  Administered 2016-07-24 – 2016-07-29 (×5): 21 mg via TRANSDERMAL
  Filled 2016-07-24 (×5): qty 1

## 2016-07-24 MED ORDER — ATORVASTATIN CALCIUM 20 MG PO TABS
40.0000 mg | ORAL_TABLET | Freq: Every day | ORAL | Status: DC
  Administered 2016-07-24 – 2016-07-28 (×4): 40 mg via ORAL
  Filled 2016-07-24 (×4): qty 2

## 2016-07-24 MED ORDER — HEPARIN SODIUM (PORCINE) 5000 UNIT/ML IJ SOLN
5000.0000 [IU] | Freq: Three times a day (TID) | INTRAMUSCULAR | Status: DC
  Administered 2016-07-24 – 2016-07-29 (×14): 5000 [IU] via SUBCUTANEOUS
  Filled 2016-07-24 (×13): qty 1

## 2016-07-24 MED ORDER — ACETAMINOPHEN 325 MG PO TABS: 650.0000 mg | ORAL_TABLET | Freq: Four times a day (QID) | ORAL | Status: DC | PRN

## 2016-07-24 MED ORDER — DIATRIZOATE MEGLUMINE & SODIUM 66-10 % PO SOLN: 15.0000 mL | Freq: Once | ORAL | Status: DC

## 2016-07-24 MED ORDER — HYDROMORPHONE HCL 1 MG/ML IJ SOLN
1.0000 mg | Freq: Once | INTRAMUSCULAR | Status: AC
  Administered 2016-07-24: 1 mg via INTRAVENOUS
  Filled 2016-07-24: qty 1

## 2016-07-24 MED ORDER — HYDROCODONE-ACETAMINOPHEN 5-325 MG PO TABS
1.0000 | ORAL_TABLET | ORAL | Status: DC | PRN
  Administered 2016-07-26 – 2016-07-27 (×2): 2 via ORAL
  Administered 2016-07-27: 1 via ORAL
  Filled 2016-07-24 (×2): qty 2
  Filled 2016-07-24: qty 1

## 2016-07-24 MED ORDER — ONDANSETRON HCL 4 MG PO TABS
4.0000 mg | ORAL_TABLET | Freq: Four times a day (QID) | ORAL | Status: DC | PRN
Start: 2016-07-24 — End: 2016-07-29

## 2016-07-24 MED ORDER — HYDROMORPHONE HCL 1 MG/ML IJ SOLN: 1.0000 mg | Freq: Once | INTRAMUSCULAR | Status: DC

## 2016-07-24 MED ORDER — ASPIRIN EC 81 MG PO TBEC
81.0000 mg | DELAYED_RELEASE_TABLET | Freq: Every day | ORAL | Status: DC
  Administered 2016-07-24 – 2016-07-29 (×5): 81 mg via ORAL
  Filled 2016-07-24 (×5): qty 1

## 2016-07-24 MED ORDER — IOPAMIDOL (ISOVUE-300) INJECTION 61%
100.0000 mL | Freq: Once | INTRAVENOUS | Status: AC | PRN
  Administered 2016-07-24: 100 mL via INTRAVENOUS

## 2016-07-24 MED ORDER — MORPHINE SULFATE (PF) 2 MG/ML IV SOLN
1.0000 mg | Freq: Three times a day (TID) | INTRAVENOUS | Status: DC | PRN
  Administered 2016-07-26: 1 mg via INTRAVENOUS
  Filled 2016-07-24: qty 1

## 2016-07-24 MED ORDER — ONDANSETRON HCL 4 MG/2ML IJ SOLN: 4.0000 mg | Freq: Once | INTRAMUSCULAR | Status: DC

## 2016-07-24 MED ORDER — ONDANSETRON HCL 4 MG/2ML IJ SOLN
4.0000 mg | Freq: Once | INTRAMUSCULAR | Status: AC
  Administered 2016-07-24: 4 mg via INTRAVENOUS
  Filled 2016-07-24: qty 2

## 2016-07-24 MED ORDER — DEXTROSE-NACL 5-0.9 % IV SOLN
INTRAVENOUS | Status: DC
  Administered 2016-07-24 – 2016-07-26 (×5): via INTRAVENOUS

## 2016-07-24 NOTE — ED Notes (Signed)
Pt to CT

## 2016-07-24 NOTE — Consult Note (Signed)
CARDIOLOGY CONSULT NOTE  Patient ID: [name] MRN: [id number] DOB/AGE: Sep 12, 1961 55 y.o.  Admit date: 07/24/2016 Referring Physician:  : Dr. Burt Knack Primary Physician: No PCP Per Patient Primary Cardiologist : None Reason for Consultation : preoperative cardiovascular evaluation For cholecystectomy  Date:  07/24/2016    Chief Complaint  Patient presents with  . Abdominal Pain      History of Present Illness: [name] is a 55 y.o. male who  Was referred by Dr. Burt Knack for preoperative cardiovascular evaluation before cholecystectomy. The patient has known history of coronary artery disease status post CABG in 2013 at [facility] with no cardiology follow-up since then. He was supposed to be on aspirin, atorvastatin and metoprolol. However, he has not been taking any medications. He had previous stroke before CABG with left carotid endarterectomy. The patient was an inpatient alcohol detox facility in [place] for more than a year and just came back to [place]. He used to be a heavy alcohol drinker. 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 He continues to smoke extensively and has been doing so for many years. Before his current illness, he reports no chest pain or significant limitations with physical activities. The patient presented with abdominal pain and was found to have cholecystitis. The plan is to proceed with cholecystectomy.    Past Medical History:  Diagnosis Date  . CVA (cerebral infarction)   . Hypertension   . MI (myocardial infarction) [facility])     Past Surgical History:  Procedure Laterality Date  . CORONARY ARTERY BYPASS GRAFT       Current Facility-Administered Medications  Medication Dose Route Frequency Provider Last Rate Last Dose  . acetaminophen (TYLENOL) tablet 650 mg  650 mg Oral Q6H PRN Max Sane, MD      . aspirin EC tablet 81 mg  81 mg Oral Daily Florene Glen, MD   81 mg at 07/24/16 1054  . atorvastatin (LIPITOR)  tablet 40 mg  40 mg Oral q1800 Wellington [place], MD      . ceFAZolin (ANCEF) IVPB 1 g/50 mL premix  1 g Intravenous Q6H Florene Glen, MD   1 g at 07/24/16 1254  . dextrose 5 %-0.9 % sodium chloride infusion   Intravenous Continuous Florene Glen, MD 75 mL/hr at 07/24/16 1054    . heparin injection 5,000 Units  5,000 Units Subcutaneous Q8H Florene Glen, MD   5,000 Units at 07/24/16 1400  . HYDROcodone-acetaminophen (NORCO/VICODIN) 5-325 MG per tablet 1-2 tablet  1-2 tablet Oral Q4H PRN Florene Glen, MD      . morphine 2 MG/ML injection 1 mg  1 mg Intravenous Q8H PRN Vipul Manuella Ghazi, MD      . nicotine (NICODERM CQ - dosed in mg/24 hours) patch 21 mg  21 mg Transdermal Daily Max Sane, MD   21 mg at 07/24/16 1254  . ondansetron (ZOFRAN) tablet 4 mg  4 mg Oral Q6H PRN Florene Glen, MD       Or  . ondansetron [facility]) injection 4 mg  4 mg Intravenous Q6H PRN Florene Glen, MD        Allergies:   Review of patient's allergies indicates no known allergies.    Social History:  The patient  reports that he has been smoking Cigarettes.  He has a 86.00 pack-year smoking history. He has never used smokeless tobacco. He reports that he does not drink alcohol or use drugs.  Family History:  The patient's family history is negative for coronary artery disease.   ROS:  Please see the history of present illness.   Otherwise, review of systems are positive for none.   All other systems are reviewed and negative.    PHYSICAL EXAM: VS:  BP (!) 141/70 (BP Location: Left Arm)   Pulse 77   Temp 98.1 F (36.7 C) (Oral)   Resp (!) 28   Ht 6' (1.829 m)   Wt 180 lb (81.6 kg)   SpO2 98%   BMI 24.41 kg/m  , BMI Body mass index is 24.41 kg/m. GEN: Well nourished, well developed, in no acute distress  HEENT: normal  Neck: no JVD, carotid bruits, or masses Cardiac: RRR; no murmurs, rubs, or gallops,no edema  Respiratory:  clear to auscultation bilaterally, normal work of breathing GI:  soft, nontender, nondistended, + BS MS: no deformity or atrophy  Skin: warm and dry, no rash Neuro:  Strength and sensation are intact Psych: euthymic mood, full affect   EKG:   Personally reviewed by me and showed: Normal sinus rhythm with possible old anteroseptal infarct and nonspecific T wave changes.   Recent Labs: 07/24/2016: ALT 10; BUN 11; Creatinine, Ser 1.09; Hemoglobin 16.5; Platelets 233; Potassium 3.9; Sodium 137    Lipid Panel    Component Value Date/Time   CHOL 207 (H) 12/02/2013 1552   TRIG 166 12/02/2013 1552   HDL 49 12/02/2013 1552   VLDL 33 12/02/2013 1552   LDLCALC 125 (H) 12/02/2013 1552      Wt Readings from Last 3 Encounters:  07/24/16 180 lb (81.6 kg)         ASSESSMENT AND PLAN:  1.  Preoperative cardiovascular evaluation for cholecystectomy: The patient has known history of coronary artery disease status post CABG in 2013. No ischemic events since then and the patient reports no anginal symptoms. Unfortunately, he has not been taking any cardiac medications. I agree with aspirin 81 mg once daily. An echocardiogram was ordered but still pending. If ejection fraction is reduced, I recommend starting a small dose beta blocker. I added atorvastatin 40 mg once daily given his established diagnosis of coronary artery disease and carotid disease. The patient had no anginal symptoms before his current presentation and he can proceed with cystectomy at an overall moderate risk from a cardiac standpoint.  2. Hyperlipidemia: I started atorvastatin. Recent liver profile was unremarkable.  3. Acute cholecystitis: Plans for laparoscopic cholecystectomy for surgery.  4. Tobacco use: Advised him to quit smoking.      Signed,  Kathlyn [place], MD  07/24/2016 4:42 PM    [place]

## 2016-07-24 NOTE — Progress Notes (Signed)
Patient seen on floor. He states he has no pain at this point has not requested pain medication no nausea or vomiting.  I will signs are reviewed abdomen is soft tender in the right upper quadrant.  I discussed the plan. I discussed also with the patient and with Dr. Manuella Ghazi internal medicine consultant the plans for surgery once he is cleared. We will consider that the next day or 2

## 2016-07-24 NOTE — ED Notes (Signed)
Pt awaiting admission bed assignment at this time.

## 2016-07-24 NOTE — ED Notes (Signed)
Pt back from CT
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
## 2016-07-24 NOTE — ED Notes (Signed)
General surgery in to see pt at this time.

## 2016-07-24 NOTE — ED Triage Notes (Signed)
Pt reports intermittent right upper quadrant pain for 3 days; was a soreness at first, now stabbing; nothing makes pain worse; took 1 vicodin with no relief; pt says he vomited Sunday morning but none since; no history of pancreatitis or cholecystitis;

## 2016-07-24 NOTE — ED Provider Notes (Signed)
Time Seen: Approximately 71  I have reviewed the triage notes  Chief Complaint: Abdominal Pain   History of Present Illness: [name] is a 55 y.o. male who presents with a three-day history of intermittent right upper quadrant abdominal pain. Patient states that the pain will radiate toward his right flank area. He denies any midline back discomfort. He has no history of alcohol usage or pancreatitis. He states he had some nausea and vomited times one without blood or bile. He denies any melena or hematochezia.   Past Medical History:  Diagnosis Date  . CVA (cerebral infarction)   . MI (myocardial infarction) ([place])     There are no active problems to display for this patient. Patient does have a psychiatric history and currently denies any suicidal thoughts, homicidal thoughts, hallucinations  Past Surgical History:  Procedure Laterality Date  . CORONARY ARTERY BYPASS GRAFT      Past Surgical History:  Procedure Laterality Date  . CORONARY ARTERY BYPASS GRAFT        Allergies:  Review of patient's allergies indicates no known allergies.  Family History: History reviewed. No pertinent family history.  Social History: Social History  Substance Use Topics  . Smoking status: Current Every Day Smoker    Packs/day: 2.00    Years: 43.00    Types: Cigarettes  . Smokeless tobacco: Never Used  . Alcohol use No     Review of Systems:   10 point review of systems was performed and was otherwise negative:  Constitutional: No fever Eyes: No visual disturbances ENT: No sore throat, ear pain Cardiac: No chest pain Respiratory: No shortness of breath, wheezing, or stridor Abdomen: Patient points primarily to the right upper quadrant. No current nausea, vomiting, or diarrhea Endocrine: No weight loss, No night sweats Extremities: No peripheral edema, cyanosis Skin: No rashes, easy bruising Neurologic: No focal weakness, trouble with speech or swollowing Urologic:  No dysuria, Hematuria, or urinary frequency   Physical Exam:  ED Triage Vitals [07/24/16 0438]  Enc Vitals Group     BP (!) 183/95     Pulse Rate 79     Resp 18     Temp      Temp Source Oral     SpO2 100 %     Weight 180 lb (81.6 kg)     Height 6' (1.829 m)     Head Circumference      Peak Flow      Pain Score 7     Pain Loc      Pain Edu?      Excl. in [place]?     General: Awake , Alert , and Oriented times 3; GCS 15 Head: Normal cephalic , atraumatic Eyes: Pupils equal , round, reactive to light Nose/Throat: No nasal drainage, patent upper airway without erythema or exudate.  Neck: Supple, Full range of motion, No anterior adenopathy or palpable thyroid masses Lungs: Clear to ascultation without wheezes , rhonchi, or rales Heart: Regular rate, regular rhythm without murmurs , gallops , or rubs Abdomen: Tender in the right upper quadrant with some guarding. No rebound or rigidity is noted. Bowel sounds are diminished but present and symmetric in all 4 quadrants. No palpable discomfort over McBurney's point      Extremities: 2 plus symmetric pulses. No edema, clubbing or cyanosis Neurologic: normal ambulation, Motor symmetric without deficits, sensory intact Skin: warm, dry, no rashes   Labs:   All laboratory work was reviewed including any pertinent negatives  or positives listed below:  Labs Reviewed  CBC WITH DIFFERENTIAL/PLATELET - Abnormal; Notable for the following:       Result Value   WBC 14.9 (*)    RDW 14.9 (*)    Neutro Abs 8.7 (*)    Lymphs Abs 3.7 (*)    Monocytes Absolute 1.7 (*)    Basophils Absolute 0.2 (*)    All other components within normal limits  COMPREHENSIVE METABOLIC PANEL  TROPONIN I  LIPASE, BLOOD    EKG:  ED ECG REPORT I, Daymon Larsen, the attending physician, personally viewed and interpreted this ECG.  Date: 07/24/2016 EKG Time: 441 Rate: 74 Rhythm: normal sinus rhythm QRS Axis: normal Intervals: normal ST/T Wave  abnormalities: Nonspecific ST-T wave abnormalities Conduction Disturbances: none Narrative Interpretation: unremarkable No acute ischemic changes Poor R-wave progression noticed in the septal leads  Radiology:  CLINICAL DATA:  Intermittent RIGHT upper quadrant pain for 3 days radiating to RIGHT chest. Vomiting and diarrhea.  EXAM: CHEST  2 VIEW  COMPARISON:  Chest radiograph June 10, 2013  FINDINGS: Cardiomediastinal silhouette is normal, mildly calcified aortic knob. Status post median sternotomy for CABG. Increased small lung volumes with flattened hemidiaphragms apical pleural thickening an bullous changes. No pleural effusion or focal consolidation. No pneumothorax. Soft tissue planes and included osseous structure nonsuspicious. Mild broad thoracic dextroscoliosis.  IMPRESSION: COPD, no acute cardiopulmonary process.   Electronically Signed   By: Elon Alas M.D.   On: 07/24/2016 05:33   EXAM: CT ABDOMEN AND PELVIS WITH CONTRAST  TECHNIQUE: Multidetector CT imaging of the abdomen and pelvis was performed using the standard protocol following bolus administration of intravenous contrast.  CONTRAST:  14mL ISOVUE-300 IOPAMIDOL (ISOVUE-300) INJECTION 61%  COMPARISON:  None.  FINDINGS: LUNG BASES: Included view of the lung bases are clear, though limited by respiratory motion. Included heart size is normal. Moderate coronary artery calcifications.  SOLID ORGANS: The liver demonstrates mild focal fatty in protrusion about the gallbladder ligament, otherwise unremarkable. Spleen, pancreas and adrenal glands are unremarkable. Mild gallbladder distension, apparent wall thickening and pericholecystic inflammation.  GASTROINTESTINAL TRACT: The stomach, small and large bowel are normal in course and caliber without inflammatory changes. The appendix is not discretely identified, however there are no inflammatory changes in the right lower  quadrant.  KIDNEYS/ URINARY TRACT: Kidneys are orthotopic, demonstrating symmetric enhancement. Punctate LEFT upper pole nephrolithiasis. No hydronephrosis or solid renal masses. RIGHT upper pole 15 mm cyst. The unopacified ureters are normal in course and caliber. Delayed imaging through the kidneys demonstrates symmetric prompt contrast excretion within the proximal urinary collecting system. Urinary bladder is predominately decompressed and unremarkable.  PERITONEUM/RETROPERITONEUM: Focally ectatic 2.4 cm infrarenal aorta associated with intimal thickening. Moderate to severe calcific atherosclerosis, severe calcific atherosclerosis in intimal thickening. No lymphadenopathy by CT size criteria. Prostate size is normal. No intraperitoneal free fluid nor free air. Small bilateral fat containing inguinal hernias.  SOFT TISSUE/OSSEOUS STRUCTURES: Non-suspicious. Moderate degenerative change of the hips. Mild broad levoscoliosis. Moderate to severe L4-5 degenerative disc resulting in moderate LEFT L4-5 neural foraminal narrowing.  IMPRESSION: CT findings of acute cholecystitis.  Moderate to severe atherosclerosis. Ectatic abdominal aorta at risk for aneurysm development. Recommend followup by ultrasound in 5 years. This recommendation follows ACR consensus guidelines: White Paper of the ACR Incidental Findings Committee II on Vascular Findings. J Am Coll Radiol 2013; 10:789-794.   Electronically Signed   By: Elon Alas M.D.   On: 07/24/2016 06:11  I personally reviewed the radiologic studies  ED Course:  Differential diagnosis includes but is not exclusive to acute cholecystitis, intrathoracic causes for epigastric abdominal pain, gastritis, duodenitis, pancreatitis, small bowel or large bowel obstruction, abdominal aortic aneurysm, hernia, gastritis, etc.  Given the patient's current presentation and objective findings I felt most likely this was acute  cholecystitis.  Reexamination of the patient's shows still some persistent right upper quadrant abdominal pain on exam Clinical Course     Assessment: Acute cholecystitis    Plan: * Surgical consultation            Daymon Larsen, MD 07/24/16 [phone]
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
## 2016-07-24 NOTE — ED Notes (Signed)
Pt returns from US at this time.

## 2016-07-24 NOTE — ED Notes (Signed)
Pt reports being NPO since 10 o'clock pm last night.

## 2016-07-25 LAB — LIPID PANEL
Cholesterol: 152 mg/dL (ref 0–200)
HDL: 29 mg/dL — ABNORMAL LOW (ref >40)
LDL CALC: 106 mg/dL — AB (ref 0–99)
TRIGLYCERIDES: 83 mg/dL (ref <150)
Total CHOL/HDL Ratio: 5.2 RATIO
VLDL: 17 mg/dL (ref 0–40)

## 2016-07-25 LAB — COMPREHENSIVE METABOLIC PANEL
ALK PHOS: 49 U/L (ref 38–126)
ALT: 9 U/L — AB (ref 17–63)
AST: 12 U/L — ABNORMAL LOW (ref 15–41)
Albumin: 3.3 g/dL — ABNORMAL LOW (ref 3.5–5.0)
Anion gap: 5 (ref 5–15)
BILIRUBIN TOTAL: 0.9 mg/dL (ref 0.3–1.2)
BUN: 6 mg/dL (ref 6–20)
CALCIUM: 8.5 mg/dL — AB (ref 8.9–10.3)
CO2: 29 mmol/L (ref 22–32)
CREATININE: 0.71 mg/dL (ref 0.61–1.24)
Chloride: 104 mmol/L (ref 101–111)
Glucose, Bld: 94 mg/dL (ref 65–99)
Potassium: 3.8 mmol/L (ref 3.5–5.1)
Sodium: 138 mmol/L (ref 135–145)
Total Protein: 6.4 g/dL — ABNORMAL LOW (ref 6.5–8.1)

## 2016-07-25 LAB — ECHOCARDIOGRAM COMPLETE
Height: 72 in
Weight: 2880 oz

## 2016-07-25 LAB — CBC
HEMATOCRIT: 42.4 % (ref 40.0–52.0)
Hemoglobin: 14.7 g/dL (ref 13.0–18.0)
MCH: 30.5 pg (ref 26.0–34.0)
MCHC: 34.6 g/dL (ref 32.0–36.0)
MCV: 88.3 fL (ref 80.0–100.0)
Platelets: 199 10*3/uL (ref 150–440)
RBC: 4.81 MIL/uL (ref 4.40–5.90)
RDW: 14.9 % — ABNORMAL HIGH (ref 11.5–14.5)
WBC: 8.4 10*3/uL (ref 3.8–10.6)

## 2016-07-25 NOTE — Progress Notes (Signed)
[facility] at [place] NAME: Veron Posso    MR#:  [id number]  DATE OF BIRTH:  September 29, 1961  SUBJECTIVE:  CHIEF COMPLAINT:   Chief Complaint  Patient presents with  . Abdominal Pain  doing well. No new complaints REVIEW OF SYSTEMS:  Review of Systems  Constitutional: Negative for chills, fever and weight loss.  HENT: Negative for nosebleeds and sore throat.   Eyes: Negative for blurred vision.  Respiratory: Negative for cough, shortness of breath and wheezing.   Cardiovascular: Negative for chest pain, orthopnea, leg swelling and PND.  Gastrointestinal: Positive for abdominal pain and nausea. Negative for constipation, diarrhea, heartburn and vomiting.  Genitourinary: Negative for dysuria and urgency.  Musculoskeletal: Negative for back pain.  Skin: Negative for rash.  Neurological: Negative for dizziness, speech change, focal weakness and headaches.  Endo/Heme/Allergies: Does not bruise/bleed easily.  Psychiatric/Behavioral: Negative for depression.   DRUG ALLERGIES:  No Known Allergies VITALS:  Blood pressure (!) 154/83, pulse 65, temperature 97.6 F (36.4 C), temperature source Oral, resp. rate 20, height 6' (1.829 m), weight 81.6 kg (180 lb), SpO2 100 %. PHYSICAL EXAMINATION:  Physical Exam  Constitutional: He is oriented to person, place, and time and well-developed, well-nourished, and in no distress.  HENT:  Head: Normocephalic and atraumatic.  Eyes: Conjunctivae and EOM are normal. Pupils are equal, round, and reactive to light.  Neck: Normal range of motion. Neck supple. No tracheal deviation present. No thyromegaly present.  Cardiovascular: Normal rate, regular rhythm and normal heart sounds.   Pulmonary/Chest: Effort normal and breath sounds normal. No respiratory distress. He has no wheezes. He exhibits no tenderness.  Abdominal: Soft. Bowel sounds are normal. He exhibits no distension. There is tenderness in the right  upper quadrant.  Musculoskeletal: Normal range of motion.  Neurological: He is alert and oriented to person, place, and time. No cranial nerve deficit.  Skin: Skin is warm and dry. No rash noted.  Psychiatric: Mood and affect normal.   LABORATORY PANEL:   CBC  Recent Labs Lab 07/25/16 0459  WBC 8.4  HGB 14.7  HCT 42.4  PLT 199   ------------------------------------------------------------------------------------------------------------------ Chemistries   Recent Labs Lab 07/25/16 0459  NA 138  K 3.8  CL 104  CO2 29  GLUCOSE 94  BUN 6  CREATININE 0.71  CALCIUM 8.5*  AST 12*  ALT 9*  ALKPHOS 49  BILITOT 0.9   RADIOLOGY:  No results found. ASSESSMENT AND PLAN:  55 year old male with a known history of hypertension, MI, CVA is seen in consultation for Preop medical clearance  * Hypertension  - Consider low dose beta blocker if blood pressure/HR can tolerate - Echo was performed per cardiology and is showing EF of 55%  * Coronary artery disease, h/o MI and CVA - continue baby aspirin - started lipitor, fasting lipid profile showed LDL 106 - Cardio following  * Tobacco abuse - He has had an 80-pack-year smoking history, Counseled for about 3 minutes, he is agreeable for nicotine replacement therapy.  started nicotine patch while here in the hospital  * Acute cholecystitis - Plan for laparoscopic cholecystectomy tomorrow - Further management per surgery     All the records are reviewed and case discussed with Care Management/Social Worker. Management plans discussed with the patient, family and they are in agreement.  CODE STATUS: FULL CODE  TOTAL TIME TAKING CARE OF THIS PATIENT: 25 minutes.   More than 50% of the time was spent in counseling/coordination  of care: Augustina Mood, Amardeep Beckers M.D on 07/25/2016 at 3:17 PM  Between 7am to 6pm - Pager - [phone]  After 6pm go to [email or web address] - [employer] [facility]    Office  [phone]  CC: Primary care physician; No PCP Per Patient  Note: This dictation was prepared with Dragon dictation along with smaller phrase technology. Any transcriptional errors that result from this process are unintentional.

## 2016-07-25 NOTE — Progress Notes (Signed)
CC: Acute cholecystitis Subjective: This patient with acute cholecystitis I appreciate the assistance of Dr. Manuella Ghazi and cardiology. Patient feels better today his pain is almost gone he is had no nausea or vomiting is tolerating a clear liquid diet he has been afebrile.  Objective: Vital signs in last 24 hours: Temp:  [98.1 F (36.7 C)-98.2 F (36.8 C)] 98.1 F (36.7 C) (08/08 0531) Pulse Rate:  [65-77] 70 (08/08 0531) Resp:  [17-22] 19 (08/08 0531) BP: (121-141)/(67-84) 134/84 (08/08 0531) SpO2:  [98 %-100 %] 99 % (08/08 0531) Last BM Date: 07/22/16  Intake/Output from previous day: 08/07 0701 - 08/08 0700 In: 1979 [P.O.:1060; I.V.:869; IV Piggyback:50] Out: 1050 [Urine:1050] Intake/Output this shift: Total I/O In: 660 [P.O.:660] Out: 1000 [Urine:1000]  Physical exam:  Awake alert oriented CTA RR abdomen is soft nondistended nontympanitic tender in the right upper quadrant with a positive Murphy sign extremities are without edema calves are nontender neuro is grossly intact integument shows no jaundice  Lab Results: CBC   Recent Labs  07/24/16 0455 07/25/16 0459  WBC 14.9* 8.4  HGB 16.5 14.7  HCT 47.6 42.4  PLT 233 199   BMET  Recent Labs  07/24/16 0455 07/25/16 0459  NA 137 138  K 3.9 3.8  CL 99* 104  CO2 30 29  GLUCOSE 100* 94  BUN 11 6  CREATININE 1.09 0.71  CALCIUM 9.1 8.5*   PT/INR No results for input(s): LABPROT, INR in the last 72 hours. ABG No results for input(s): PHART, HCO3 in the last 72 hours.  Invalid input(s): PCO2, PO2  Studies/Results: Dg Chest 2 View  Result Date: 07/24/2016 CLINICAL DATA:  Intermittent RIGHT upper quadrant pain for 3 days radiating to RIGHT chest. Vomiting and diarrhea. EXAM: CHEST  2 VIEW COMPARISON:  Chest radiograph June 10, 2013 FINDINGS: Cardiomediastinal silhouette is normal, mildly calcified aortic knob. Status post median sternotomy for CABG. Increased small lung volumes with flattened hemidiaphragms apical  pleural thickening an bullous changes. No pleural effusion or focal consolidation. No pneumothorax. Soft tissue planes and included osseous structure nonsuspicious. Mild broad thoracic dextroscoliosis. IMPRESSION: COPD, no acute cardiopulmonary process. Electronically Signed   By: Elon Alas M.D.   On: 07/24/2016 05:33   Ct Abdomen Pelvis W Contrast  Result Date: 07/24/2016 CLINICAL DATA:  Intermittent RIGHT upper quadrant pain for 3 days. EXAM: CT ABDOMEN AND PELVIS WITH CONTRAST TECHNIQUE: Multidetector CT imaging of the abdomen and pelvis was performed using the standard protocol following bolus administration of intravenous contrast. CONTRAST:  134mL ISOVUE-300 IOPAMIDOL (ISOVUE-300) INJECTION 61% COMPARISON:  None. FINDINGS: LUNG BASES: Included view of the lung bases are clear, though limited by respiratory motion. Included heart size is normal. Moderate coronary artery calcifications. SOLID ORGANS: The liver demonstrates mild focal fatty in protrusion about the gallbladder ligament, otherwise unremarkable. Spleen, pancreas and adrenal glands are unremarkable. Mild gallbladder distension, apparent wall thickening and pericholecystic inflammation. GASTROINTESTINAL TRACT: The stomach, small and large bowel are normal in course and caliber without inflammatory changes. The appendix is not discretely identified, however there are no inflammatory changes in the right lower quadrant. KIDNEYS/ URINARY TRACT: Kidneys are orthotopic, demonstrating symmetric enhancement. Punctate LEFT upper pole nephrolithiasis. No hydronephrosis or solid renal masses. RIGHT upper pole 15 mm cyst. The unopacified ureters are normal in course and caliber. Delayed imaging through the kidneys demonstrates symmetric prompt contrast excretion within the proximal urinary collecting system. Urinary bladder is predominately decompressed and unremarkable. PERITONEUM/RETROPERITONEUM: Focally ectatic 2.4 cm infrarenal aorta associated  with  intimal thickening. Moderate to severe calcific atherosclerosis, severe calcific atherosclerosis in intimal thickening. No lymphadenopathy by CT size criteria. Prostate size is normal. No intraperitoneal free fluid nor free air. Small bilateral fat containing inguinal hernias. SOFT TISSUE/OSSEOUS STRUCTURES: Non-suspicious. Moderate degenerative change of the hips. Mild broad levoscoliosis. Moderate to severe L4-5 degenerative disc resulting in moderate LEFT L4-5 neural foraminal narrowing. IMPRESSION: CT findings of acute cholecystitis. Moderate to severe atherosclerosis. Ectatic abdominal aorta at risk for aneurysm development. Recommend followup by ultrasound in 5 years. This recommendation follows ACR consensus guidelines: White Paper of the ACR Incidental Findings Committee II on Vascular Findings. J Am Coll Radiol 2013; 10:789-794. Electronically Signed   By: Elon Alas M.D.   On: 07/24/2016 06:11   US Abdomen Limited Ruq  Result Date: 07/24/2016 CLINICAL DATA:  Right upper quadrant pain EXAM: US ABDOMEN LIMITED - RIGHT UPPER QUADRANT COMPARISON:  None. FINDINGS: Gallbladder: The gallbladder contains multiple small stones and sludge. The largest stone is 3.5 mm. The wall of the gallbladder is thickened and irregular at 5 mm. Sonographic Percell Miller sign is negative but it is unreliable because the patient is medicated. Common bile duct: Diameter: 2.4 mm. Liver: The liver is diffusely increased in echogenicity without focal mass. IMPRESSION: Cholelithiasis. There is gallbladder wall thickening and irregularity. These findings may reflect acute cholecystitis. Correlate clinically as for the need for nuclear medicine imaging Diffuse hepatic steatosis without focal liver mass. Electronically Signed   By: Marybelle Killings M.D.   On: 07/24/2016 07:32    Anti-infectives: Anti-infectives    Start     Dose/Rate Route Frequency Ordered Stop   07/24/16 0800  ceFAZolin (ANCEF) IVPB 1 g/50 mL premix     1  g 100 mL/hr over 30 Minutes Intravenous Every 6 hours 07/24/16 0751        Assessment/Plan:  TEE was personally reviewed 55% ejection fraction. I will await cardiology's input today but will likely proceed with laparoscopic cholecystectomy tomorrow he is currently on antibiotics the rationale for this been discussed the options of observation reviewed the procedure itself was discussed and the risk of bleeding infection recurrence bowel injury bile duct injury bile duct leak retained stone any of which could require further surgery were all reviewed with him he understood and agreed to proceed. We'll plan surgery for tomorrow.  Florene Glen, MD, FACS  07/25/2016

## 2016-07-25 NOTE — Clinical Social Work Note (Signed)
CSW consulted to arrange a primary care physician and possible assist with medications as the consult states no medication coverage. RN CM to be notified as they address these specific areas. Please re-consult CSW if needed. Shela Leff MSW,LCSW [phone]

## 2016-07-26 ENCOUNTER — Encounter: Payer: Self-pay | Admitting: *Deleted

## 2016-07-26 ENCOUNTER — Encounter
Admission: EM | Disposition: A | Discharge status: Home / Self Care | Payer: Self-pay | Source: Home / Self Care | Attending: Surgery

## 2016-07-26 ENCOUNTER — Inpatient Hospital Stay: Payer: Medicaid Other | Admitting: Anesthesiology

## 2016-07-26 HISTORY — PX: CHOLECYSTECTOMY: SHX55

## 2016-07-26 LAB — COMPREHENSIVE METABOLIC PANEL
ALBUMIN: 3.3 g/dL — AB (ref 3.5–5.0)
ALT: 9 U/L — ABNORMAL LOW (ref 17–63)
ALT: 8 U/L — ABNORMAL LOW (ref 17–63)
ANION GAP: 5 (ref 5–15)
AST: 15 U/L (ref 15–41)
AST: 15 U/L (ref 15–41)
Albumin: 3.4 g/dL — ABNORMAL LOW (ref 3.5–5.0)
Alkaline Phosphatase: 50 U/L (ref 38–126)
Alkaline Phosphatase: 49 U/L (ref 38–126)
Anion gap: 4 — ABNORMAL LOW (ref 5–15)
BILIRUBIN TOTAL: 0.9 mg/dL (ref 0.3–1.2)
BUN: 5 mg/dL — AB (ref 6–20)
BUN: 5 mg/dL — AB (ref 6–20)
CHLORIDE: 105 mmol/L (ref 101–111)
CO2: 30 mmol/L (ref 22–32)
CO2: 29 mmol/L (ref 22–32)
CREATININE: 0.73 mg/dL (ref 0.61–1.24)
Calcium: 8.5 mg/dL — ABNORMAL LOW (ref 8.9–10.3)
Calcium: 8.6 mg/dL — ABNORMAL LOW (ref 8.9–10.3)
Chloride: 106 mmol/L (ref 101–111)
Creatinine, Ser: 0.75 mg/dL (ref 0.61–1.24)
GFR calc Af Amer: >60 mL/min (ref >60)
GFR calc Af Amer: >60 mL/min (ref >60)
Glucose, Bld: 84 mg/dL (ref 65–99)
Glucose, Bld: 90 mg/dL (ref 65–99)
POTASSIUM: 3.7 mmol/L (ref 3.5–5.1)
POTASSIUM: 4 mmol/L (ref 3.5–5.1)
Sodium: 140 mmol/L (ref 135–145)
Sodium: 139 mmol/L (ref 135–145)
TOTAL PROTEIN: 6.6 g/dL (ref 6.5–8.1)
Total Bilirubin: 0.5 mg/dL (ref 0.3–1.2)
Total Protein: 6.4 g/dL — ABNORMAL LOW (ref 6.5–8.1)

## 2016-07-26 LAB — CBC WITH DIFFERENTIAL/PLATELET
BASOS ABS: 0.1 10*3/uL (ref 0–0.1)
Basophils Relative: 1 %
Eosinophils Absolute: 0.4 10*3/uL (ref 0–0.7)
Eosinophils Relative: 4 %
HEMATOCRIT: 45.6 % (ref 40.0–52.0)
Hemoglobin: 15.6 g/dL (ref 13.0–18.0)
LYMPHS ABS: 3 10*3/uL (ref 1.0–3.6)
LYMPHS PCT: 33 %
MCH: 30.3 pg (ref 26.0–34.0)
MCHC: 34.3 g/dL (ref 32.0–36.0)
MCV: 88.6 fL (ref 80.0–100.0)
MONO ABS: 0.8 10*3/uL (ref 0.2–1.0)
MONOS PCT: 9 %
NEUTROS ABS: 4.7 10*3/uL (ref 1.4–6.5)
Neutrophils Relative %: 53 %
Platelets: 218 10*3/uL (ref 150–440)
RBC: 5.15 MIL/uL (ref 4.40–5.90)
RDW: 14.5 % (ref 11.5–14.5)
WBC: 9 10*3/uL (ref 3.8–10.6)

## 2016-07-26 LAB — SURGICAL PCR SCREEN
MRSA, PCR: NEGATIVE
Staphylococcus aureus: NEGATIVE

## 2016-07-26 SURGERY — LAPAROSCOPIC CHOLECYSTECTOMY WITH INTRAOPERATIVE CHOLANGIOGRAM
Anesthesia: General | Wound class: Contaminated

## 2016-07-26 MED ORDER — FENTANYL CITRATE (PF) 100 MCG/2ML IJ SOLN: 25.0000 ug | INTRAMUSCULAR | Status: DC | PRN

## 2016-07-26 MED ORDER — LACTATED RINGERS IV SOLN
INTRAVENOUS | Status: DC | PRN
  Administered 2016-07-26: 16:00:00 via INTRAVENOUS

## 2016-07-26 MED ORDER — LABETALOL HCL 5 MG/ML IV SOLN
INTRAVENOUS | Status: DC | PRN
  Administered 2016-07-26: 5 mg via INTRAVENOUS

## 2016-07-26 MED ORDER — BUPIVACAINE-EPINEPHRINE (PF) 0.25% -1:200000 IJ SOLN
INTRAMUSCULAR | Status: AC
  Filled 2016-07-26: qty 30

## 2016-07-26 MED ORDER — DIPHENHYDRAMINE HCL 25 MG PO CAPS
50.0000 mg | ORAL_CAPSULE | Freq: Once | ORAL | Status: AC
  Administered 2016-07-26: 50 mg via ORAL
  Filled 2016-07-26: qty 2

## 2016-07-26 MED ORDER — SUCCINYLCHOLINE CHLORIDE 20 MG/ML IJ SOLN
INTRAMUSCULAR | Status: DC | PRN
  Administered 2016-07-26: 100 mg via INTRAVENOUS

## 2016-07-26 MED ORDER — OXYCODONE HCL 5 MG/5ML PO SOLN: 5.0000 mg | Freq: Once | ORAL | Status: DC | PRN

## 2016-07-26 MED ORDER — HYDROMORPHONE HCL 1 MG/ML IJ SOLN
INTRAMUSCULAR | Status: AC
  Administered 2016-07-26: 0.5 mg via INTRAVENOUS
  Filled 2016-07-26: qty 1

## 2016-07-26 MED ORDER — METOPROLOL TARTRATE 25 MG PO TABS
12.5000 mg | ORAL_TABLET | Freq: Two times a day (BID) | ORAL | Status: DC
  Administered 2016-07-26 – 2016-07-29 (×7): 12.5 mg via ORAL
  Filled 2016-07-26 (×7): qty 1

## 2016-07-26 MED ORDER — OXYCODONE HCL 5 MG PO TABS: 5.0000 mg | ORAL_TABLET | Freq: Once | ORAL | Status: DC | PRN

## 2016-07-26 MED ORDER — MEPERIDINE HCL 25 MG/ML IJ SOLN: 6.2500 mg | INTRAMUSCULAR | Status: DC | PRN

## 2016-07-26 MED ORDER — FENTANYL CITRATE (PF) 100 MCG/2ML IJ SOLN
INTRAMUSCULAR | Status: DC | PRN
  Administered 2016-07-26 (×4): 50 ug via INTRAVENOUS

## 2016-07-26 MED ORDER — HYDRALAZINE HCL 20 MG/ML IJ SOLN
INTRAMUSCULAR | Status: AC
  Administered 2016-07-26: 10 mg via INTRAVENOUS
  Filled 2016-07-26: qty 1

## 2016-07-26 MED ORDER — ROCURONIUM BROMIDE 100 MG/10ML IV SOLN
INTRAVENOUS | Status: DC | PRN
  Administered 2016-07-26: 20 mg via INTRAVENOUS

## 2016-07-26 MED ORDER — SUGAMMADEX SODIUM 200 MG/2ML IV SOLN
INTRAVENOUS | Status: DC | PRN
  Administered 2016-07-26: 200 mg via INTRAVENOUS

## 2016-07-26 MED ORDER — PROPOFOL 10 MG/ML IV BOLUS
INTRAVENOUS | Status: DC | PRN
  Administered 2016-07-26: 80 mg via INTRAVENOUS

## 2016-07-26 MED ORDER — PROMETHAZINE HCL 25 MG/ML IJ SOLN: 6.2500 mg | INTRAMUSCULAR | Status: DC | PRN

## 2016-07-26 MED ORDER — LIDOCAINE HCL (CARDIAC) 20 MG/ML IV SOLN
INTRAVENOUS | Status: DC | PRN
  Administered 2016-07-26: 80 mg via INTRAVENOUS

## 2016-07-26 MED ORDER — ONDANSETRON HCL 4 MG/2ML IJ SOLN
INTRAMUSCULAR | Status: DC | PRN
  Administered 2016-07-26: 4 mg via INTRAVENOUS

## 2016-07-26 MED ORDER — FAMOTIDINE IN NACL 20-0.9 MG/50ML-% IV SOLN
20.0000 mg | Freq: Two times a day (BID) | INTRAVENOUS | Status: DC
  Administered 2016-07-26 (×2): 20 mg via INTRAVENOUS
  Filled 2016-07-26 (×4): qty 50

## 2016-07-26 MED ORDER — FENTANYL CITRATE (PF) 100 MCG/2ML IJ SOLN
INTRAMUSCULAR | Status: DC
Start: 2016-07-26 — End: 2016-07-26
  Filled 2016-07-26: qty 2

## 2016-07-26 MED ORDER — LABETALOL HCL 5 MG/ML IV SOLN
10.0000 mg | Freq: Once | INTRAVENOUS | Status: AC
  Administered 2016-07-26: 10 mg via INTRAVENOUS

## 2016-07-26 MED ORDER — HYDROMORPHONE HCL 1 MG/ML IJ SOLN
0.2500 mg | INTRAMUSCULAR | Status: DC | PRN
  Administered 2016-07-26 (×4): 0.5 mg via INTRAVENOUS

## 2016-07-26 MED ORDER — HYDRALAZINE HCL 20 MG/ML IJ SOLN
10.0000 mg | Freq: Once | INTRAMUSCULAR | Status: AC
  Administered 2016-07-26: 10 mg via INTRAVENOUS

## 2016-07-26 MED ORDER — BUPIVACAINE-EPINEPHRINE (PF) 0.25% -1:200000 IJ SOLN
INTRAMUSCULAR | Status: DC | PRN
  Administered 2016-07-26: 30 mL via PERINEURAL

## 2016-07-26 MED ORDER — LABETALOL HCL 5 MG/ML IV SOLN
INTRAVENOUS | Status: AC
  Administered 2016-07-26: 10 mg via INTRAVENOUS
  Filled 2016-07-26: qty 4

## 2016-07-26 MED ORDER — MIDAZOLAM HCL 2 MG/2ML IJ SOLN
INTRAMUSCULAR | Status: DC | PRN
  Administered 2016-07-26: 2 mg via INTRAVENOUS

## 2016-07-26 SURGICAL SUPPLY — 46 items
ADHESIVE MASTISOL STRL (MISCELLANEOUS) ×3 IMPLANT
APPLIER CLIP ROT 10 11.4 M/L (STAPLE) ×3
BLADE CLIPPER SURG (BLADE) ×3 IMPLANT
BLADE SURG SZ11 CARB STEEL (BLADE) ×3 IMPLANT
CANISTER SUCT 1200ML W/VALVE (MISCELLANEOUS) ×3 IMPLANT
CATH CHOLANGI 4FR 420404F (CATHETERS) IMPLANT
CHLORAPREP W/TINT 26ML (MISCELLANEOUS) ×3 IMPLANT
CLIP APPLIE ROT 10 11.4 M/L (STAPLE) ×1 IMPLANT
CLOSURE WOUND 1/2 X4 (GAUZE/BANDAGES/DRESSINGS) ×1
CONRAY 60ML FOR OR (MISCELLANEOUS) IMPLANT
DRAPE C-ARM XRAY 36X54 (DRAPES) IMPLANT
ELECT REM PT RETURN 9FT ADLT (ELECTROSURGICAL) ×3
ELECTRODE REM PT RTRN 9FT ADLT (ELECTROSURGICAL) ×1 IMPLANT
ENDOPOUCH RETRIEVER 10 (MISCELLANEOUS) ×3 IMPLANT
GAUZE SPONGE NON-WVN 2X2 STRL (MISCELLANEOUS) ×4 IMPLANT
GLOVE BIO SURGEON STRL SZ8 (GLOVE) ×12 IMPLANT
GOWN STRL REUS W/ TWL LRG LVL3 (GOWN DISPOSABLE) ×3 IMPLANT
GOWN STRL REUS W/TWL LRG LVL3 (GOWN DISPOSABLE) ×6
IRRIGATION STRYKERFLOW (MISCELLANEOUS) IMPLANT
IRRIGATOR STRYKERFLOW (MISCELLANEOUS)
IV CATH ANGIO 12GX3 LT BLUE (NEEDLE) IMPLANT
IV NS 1000ML (IV SOLUTION)
IV NS 1000ML BAXH (IV SOLUTION) IMPLANT
JACKSON PRATT 10 (INSTRUMENTS) ×3 IMPLANT
KIT RM TURNOVER STRD PROC AR (KITS) ×3 IMPLANT
LABEL OR SOLS (LABEL) ×3 IMPLANT
NDL SAFETY 22GX1.5 (NEEDLE) ×3 IMPLANT
NEEDLE VERESS 14GA 120MM (NEEDLE) ×3 IMPLANT
NS IRRIG 500ML POUR BTL (IV SOLUTION) ×3 IMPLANT
PACK LAP CHOLECYSTECTOMY (MISCELLANEOUS) ×3 IMPLANT
SCISSORS METZENBAUM CVD 33 (INSTRUMENTS) ×3 IMPLANT
SLEEVE ENDOPATH XCEL 5M (ENDOMECHANICALS) ×6 IMPLANT
SPONGE EXCIL AMD DRAIN 4X4 6P (MISCELLANEOUS) IMPLANT
SPONGE LAP 18X18 5 PK (GAUZE/BANDAGES/DRESSINGS) ×3 IMPLANT
SPONGE VERSALON 2X2 STRL (MISCELLANEOUS) ×8
STRIP CLOSURE SKIN 1/2X4 (GAUZE/BANDAGES/DRESSINGS) ×2 IMPLANT
SUT ETHILON 3-0 FS-10 30 BLK (SUTURE) ×3
SUT MNCRL 4-0 (SUTURE) ×2
SUT MNCRL 4-0 27XMFL (SUTURE) ×1
SUT VICRYL 0 AB UR-6 (SUTURE) ×3 IMPLANT
SUTURE EHLN 3-0 FS-10 30 BLK (SUTURE) ×1 IMPLANT
SUTURE MNCRL 4-0 27XMF (SUTURE) ×1 IMPLANT
SYR 20CC LL (SYRINGE) ×3 IMPLANT
TROCAR XCEL NON-BLD 11X100MML (ENDOMECHANICALS) ×3 IMPLANT
TROCAR XCEL NON-BLD 5MMX100MML (ENDOMECHANICALS) ×3 IMPLANT
TUBING INSUFFLATOR HI FLOW (MISCELLANEOUS) ×3 IMPLANT

## 2016-07-26 NOTE — Anesthesia Preprocedure Evaluation (Signed)
Anesthesia Evaluation  Patient identified by MRN, date of birth, ID band Patient awake    Reviewed: Allergy & Precautions, NPO status , Patient's Chart, lab work & pertinent test results  History of Anesthesia Complications Negative for: history of anesthetic complications  Airway Mallampati: III  TM Distance: >3 FB Neck ROM: Full    Dental  (+) Edentulous Upper, Edentulous Lower   Pulmonary COPD, Current Smoker,    breath sounds clear to auscultation- rhonchi (-) wheezing      Cardiovascular hypertension, + Past MI and + CABG   Rhythm:Regular Rate:Normal - Systolic murmurs and - Diastolic murmurs Echo 99991111: LVEF 55%, aortic sclerosis without stenosis, estimated valve area 1.4   Neuro/Psych negative neurological ROS  negative psych ROS   GI/Hepatic Neg liver ROS, GERD  Medicated,  Endo/Other  negative endocrine ROSDiabetes: borderline.  Renal/GU negative Renal ROS     Musculoskeletal negative musculoskeletal ROS (+)   Abdominal (+) - obese,   Peds  Hematology negative hematology ROS (+)   Anesthesia Other Findings Full beard  Past Medical History: No date: CVA (cerebral infarction) No date: Hypertension No date: MI (myocardial infarction) ([place])   Reproductive/Obstetrics                             Anesthesia Physical Anesthesia Plan  ASA: III  Anesthesia Plan: General   Post-op Pain Management:    Induction: Intravenous  Airway Management Planned: Oral ETT  Additional Equipment:   Intra-op Plan:   Post-operative Plan: Extubation in OR  Informed Consent: I have reviewed the patients History and Physical, chart, labs and discussed the procedure including the risks, benefits and alternatives for the proposed anesthesia with the patient or authorized representative who has indicated his/her understanding and acceptance.   Dental advisory given  Plan Discussed with:  Anesthesiologist  Anesthesia Plan Comments:         Anesthesia Quick Evaluation

## 2016-07-26 NOTE — Plan of Care (Signed)
Report given to OR RN. Informed of patient high BP and that beta blocker was given with sip of water this AM. Has been NPO since midnight. CHG X2 has also been completed.  No jewelry or under garments on. Consent signed.

## 2016-07-26 NOTE — Plan of Care (Signed)
Gave 12.5 Metoprolol about 11AM, but BP now 159/92. Text Dr. To inform and request possible order for BP PRN.

## 2016-07-26 NOTE — Op Note (Signed)
Laparoscopic Cholecystectomy  Pre-operative Diagnosis: Acute cholecystitis  Post-operative Diagnosis: Acute cholecystitis  Procedure: Laparoscopic cholecystectomy  Surgeon: Jerrol Banana. Burt Knack, MD FACS  Anesthesia: Gen. with endotracheal tube  Assistant: Surgical tech  Procedure Details  The patient was seen again in the Holding Room. The benefits, complications, treatment options, and expected outcomes were discussed with the patient. The risks of bleeding, infection, recurrence of symptoms, failure to resolve symptoms, bile duct damage, bile duct leak, retained common bile duct stone, bowel injury, any of which could require further surgery and/or ERCP, stent, or papillotomy were reviewed with the patient. The likelihood of improving the patient's symptoms with return to their baseline status is good.  The patient and/or family concurred with the proposed plan, giving informed consent.  The patient was taken to Operating Room, identified as [name] and the procedure verified as Laparoscopic Cholecystectomy.  A Time Out was held and the above information confirmed.  Prior to the induction of general anesthesia, antibiotic prophylaxis was administered. VTE prophylaxis was in place. General endotracheal anesthesia was then administered and tolerated well. After the induction, the abdomen was prepped with Chloraprep and draped in the sterile fashion. The patient was positioned in the supine position.  Local anesthetic  was injected into the skin near the umbilicus and an incision made. The Veress needle was placed. Pneumoperitoneum was then created with CO2 and tolerated well without any adverse changes in the patient's vital signs. A 76mm port was placed in the periumbilical position and the abdominal cavity was explored.  Two 5-mm ports were placed in the right upper quadrant and a 12 mm epigastric port was placed all under direct vision. All skin incisions  were infiltrated with a local  anesthetic agent before making the incision and placing the trocars.   The patient was positioned  in reverse Trendelenburg, tilted slightly to the patient's left.  The gallbladder was identified, the fundus grasped and retracted cephalad. Adhesions were lysed bluntly. The infundibulum was grasped and retracted laterally, exposing the peritoneum overlying the triangle of Calot. This was then divided and exposed in a blunt fashion. A critical view of the cystic duct and cystic artery was obtained.  The cystic duct was clearly identified and bluntly dissected.   The cystic artery was doubly clipped and divided which allowed for good visualization of the cystic duct as it entered the infundibulum of the gallbladder. It was doubly clipped and divided.  The gallbladder was taken from the gallbladder fossa in a retrograde fashion with the electrocautery. The gallbladder was removed and placed in an Endocatch bag. The liver bed was irrigated and inspected. Hemostasis was achieved with the electrocautery. Copious irrigation was utilized and was repeatedly aspirated until clear.  The gallbladder and Endocatch sac were then removed through the epigastric port site.   A JP drain was brought into a lateral port site and placed in the foramen of Winslow and held in with 3-0 nylon.  Inspection of the right upper quadrant was performed. No bleeding, bile duct injury or leak, or bowel injury was noted. Pneumoperitoneum was released.  The epigastric port site was closed with figure-of-eight 0 Vicryl sutures. 4-0 subcuticular Monocryl was used to close the skin. Steristrips and Mastisol and sterile dressings were  applied.  The patient was then extubated and brought to the recovery room in stable condition. Sponge, lap, and needle counts were correct at closure and at the conclusion of the case.   Findings: Acute Cholecystitis   Estimated Blood Loss: 50  cc         Drains: JP 1         Specimens: Gallbladder            Complications: none               Kale Rondeau E. Burt Knack, MD, FACS

## 2016-07-26 NOTE — Progress Notes (Signed)
Preoperative Review   Patient is met in the surgical floor. Patient describes no pain this morning he feels well and is ready for surgery. Denies nausea or vomiting risks were reviewed in detail surgery later today The history is reviewed in the chart and with the patient. I personally reviewed the options and rationale as well as the risks of this procedure that have been previously discussed with the patient. All questions asked by the patient and/or family were answered to their satisfaction.  Patient agrees to proceed with this procedure at this time.  Florene Glen M.D. FACS

## 2016-07-26 NOTE — Transfer of Care (Signed)
Immediate Anesthesia Transfer of Care Note  Patient: Orvis Brill  Procedure(s) Performed: Procedure(s): LAPAROSCOPIC CHOLECYSTECTOMY (N/A)  Patient Location: PACU  Anesthesia Type:General  Level of Consciousness: awake  Airway & Oxygen Therapy: Patient Spontanous Breathing and Patient connected to face mask oxygen  Post-op Assessment: Report given to RN and Post -op Vital signs reviewed and stable  Post vital signs: Reviewed and stable  Last Vitals:  Vitals:   07/26/16 1233 07/26/16 1449  BP: (!) 159/92 (!) 163/90  Pulse: (!) 59 (!) 58  Resp: 18 18  Temp: 36.7 C (!) 35.7 C    Last Pain:  Vitals:   07/26/16 1449  TempSrc: Tympanic  PainSc: 0-No pain         Complications: No apparent anesthesia complications

## 2016-07-26 NOTE — Progress Notes (Signed)
[facility] [place] NAME: [name]    MR#:  [id number]  DATE OF BIRTH:  [birth year]  SUBJECTIVE:  CHIEF COMPLAINT:   Chief Complaint  Patient presents with  . Abdominal Pain   patient is resting comfortably. Pain is well controlled. Asking something for acidity. Sheduled for surgry today REVIEW OF SYSTEMS:  Review of Systems  Constitutional: Negative for chills, fever and weight loss.  HENT: Negative for nosebleeds and sore throat.   Eyes: Negative for blurred vision.  Respiratory: Negative for cough, shortness of breath and wheezing.   Cardiovascular: Negative for chest pain, orthopnea, leg swelling and PND.  Gastrointestinal: Positive for abdominal pain and nausea. Negative for constipation, diarrhea, heartburn and vomiting.  Genitourinary: Negative for dysuria and urgency.  Musculoskeletal: Negative for back pain.  Skin: Negative for rash.  Neurological: Negative for dizziness, speech change, focal weakness and headaches.  Endo/Heme/Allergies: Does not bruise/bleed easily.  Psychiatric/Behavioral: Negative for depression.   DRUG ALLERGIES:  No Known Allergies VITALS:  Blood pressure 117/82, pulse 64, temperature 97.9 F (36.6 C), temperature source Oral, resp. rate 16, height 6' (1.829 m), weight 81.6 kg (180 lb), SpO2 97 %. PHYSICAL EXAMINATION:  Physical Exam  Constitutional: He is oriented to person, place, and time and well-developed, well-nourished, and in no distress.  HENT:  Head: Normocephalic and atraumatic.  Eyes: Conjunctivae and EOM are normal. Pupils are equal, round, and reactive to light.  Neck: Normal range of motion. Neck supple. No tracheal deviation present. No thyromegaly present.  Cardiovascular: Normal rate, regular rhythm and normal heart sounds.   Pulmonary/Chest: Effort normal and breath sounds normal. No respiratory distress. He has no wheezes. He exhibits no tenderness.  Abdominal: Soft. Bowel  sounds are normal. He exhibits no distension. There is tenderness in the right upper quadrant.  Musculoskeletal: Normal range of motion.  Neurological: He is alert and oriented to person, place, and time. No cranial nerve deficit.  Skin: Skin is warm and dry. No rash noted.  Psychiatric: Mood and affect normal.   LABORATORY PANEL:   CBC  Recent Labs Lab 07/26/16 0446  WBC 9.0  HGB 15.6  HCT 45.6  PLT 218   ------------------------------------------------------------------------------------------------------------------ Chemistries   Recent Labs Lab 07/26/16 0446  NA 140  K 3.7  CL 106  CO2 30  GLUCOSE 84  BUN 5*  CREATININE 0.73  CALCIUM 8.5*  AST 15  ALT 9*  ALKPHOS 50  BILITOT 0.9   RADIOLOGY:  No results found. ASSESSMENT AND PLAN:  55 year old male with a known history of hypertension, MI, CVA is seen in consultation for Preop medical clearance  * Hypertension  -  low dose beta blocker  started with holding parameters if blood pressure/HR can tolerate - Echo was performed per cardiology and is showing EF of 55%  * GERd  Pepcid 20 mg IV evry 12 hours  * Coronary artery disease, h/o MI and CVA -   continue baby aspirin   -  continue lipitor, fasting lipid profile showed LDL 106 - Cardio following  * Tobacco abuse - He has had an 80-pack-year smoking history, Counseled for about 3 minutes, he is agreeable for nicotine replacement therapy.  started nicotine patch while here in the hospital  * Acute cholecystitis - Plan for laparoscopic cholecystectomy  Today by Dr. Burt Knack.Continue nothing by mouth - Further management per surgery - am labs      All the records are reviewed and case discussed  with Care Management/Social Worker. Management plans discussed with the patient, family and they are in agreement.  CODE STATUS: FULL CODE  TOTAL TIME TAKING CARE OF THIS PATIENT: 25 minutes.   More than 50% of the time was spent in  counseling/coordination of care: Rosemary Holms M.D on 07/26/2016 at 9:52 AM  Between 7am to 6pm - Pager - [phone]  After 6pm go to [email or web address] - [employer] [facility]  Office  [phone]  CC: Primary care physician; No PCP Per Patient  Note: This dictation was prepared with Dragon dictation along with smaller phrase technology. Any transcriptional errors that result from this process are unintentional.

## 2016-07-26 NOTE — Care Management Note (Signed)
Case Management Note  Patient Details  Name: [name] MRN: [id number] Date of Birth: July 24, 1961  Subjective/Objective:           Patient admitted with Acute cholecystitis.   Patient was awaiting cardiac clearance for OR. Patient states that he is new to the area.  "I have lived with my son for 2 weeks". Patient states that he is unemployed, and uninsured.        Patient's ex wife and ex mother in law live in the house as well.  Ex wife inquires about applying for disability as the patient has a history of stroke.  I informed them they would need to go to DSS.     Action/Plan: Patient does not have PCP or pharmacy.  I have provided the patient with an application to [facility] and Medication Management. RNCM following for discharge planning.   Expected Discharge Date:                  Expected Discharge Plan:     In-House Referral:     Discharge planning Services     Post Acute Care Choice:    Choice offered to:     DME Arranged:    DME Agency:     HH Arranged:    HH Agency:     Status of Service:     If discussed at [employer], dates discussed:    Additional Comments:  Beverly Sessions, RN 07/26/2016, 12:05 PM

## 2016-07-26 NOTE — Anesthesia Procedure Notes (Signed)
Procedure Name: Intubation Date/Time: 07/26/2016 3:48 PM Performed by: Randa Lynn, Tinzlee Craker Pre-anesthesia Checklist: Patient identified, Patient being monitored, Timeout performed, Emergency Drugs available and Suction available Patient Re-evaluated:Patient Re-evaluated prior to inductionOxygen Delivery Method: Circle system utilized Preoxygenation: Pre-oxygenation with 100% oxygen Intubation Type: IV induction Ventilation: Mask ventilation without difficulty and Oral airway inserted - appropriate to patient size Laryngoscope Size: Mac and 4 Grade View: Grade I Tube type: Oral Tube size: 7.5 mm Number of attempts: 1 Placement Confirmation: ETT inserted through vocal cords under direct vision,  positive ETCO2 and breath sounds checked- equal and bilateral Secured at: 23 cm Tube secured with: Tape Dental Injury: Teeth and Oropharynx as per pre-operative assessment

## 2016-07-27 ENCOUNTER — Encounter: Payer: Self-pay | Admitting: Surgery

## 2016-07-27 LAB — CBC WITH DIFFERENTIAL/PLATELET
BASOS ABS: 0.1 10*3/uL (ref 0–0.1)
BASOS PCT: 1 %
EOS ABS: 0.2 10*3/uL (ref 0–0.7)
EOS PCT: 2 %
HCT: 42.7 % (ref 40.0–52.0)
Hemoglobin: 14.4 g/dL (ref 13.0–18.0)
Lymphocytes Relative: 19 %
Lymphs Abs: 2.3 10*3/uL (ref 1.0–3.6)
MCH: 30.2 pg (ref 26.0–34.0)
MCHC: 33.9 g/dL (ref 32.0–36.0)
MCV: 89.3 fL (ref 80.0–100.0)
MONO ABS: 1.3 10*3/uL — AB (ref 0.2–1.0)
MONOS PCT: 11 %
NEUTROS ABS: 8.1 10*3/uL — AB (ref 1.4–6.5)
Neutrophils Relative %: 67 %
PLATELETS: 218 10*3/uL (ref 150–440)
RBC: 4.78 MIL/uL (ref 4.40–5.90)
RDW: 14.5 % (ref 11.5–14.5)
WBC: 12.1 10*3/uL — ABNORMAL HIGH (ref 3.8–10.6)

## 2016-07-27 LAB — COMPREHENSIVE METABOLIC PANEL
ALBUMIN: 3.1 g/dL — AB (ref 3.5–5.0)
ALT: 52 U/L (ref 17–63)
ANION GAP: 4 — AB (ref 5–15)
AST: 109 U/L — AB (ref 15–41)
Alkaline Phosphatase: 87 U/L (ref 38–126)
BUN: 5 mg/dL — ABNORMAL LOW (ref 6–20)
CALCIUM: 8.4 mg/dL — AB (ref 8.9–10.3)
CO2: 31 mmol/L (ref 22–32)
Chloride: 103 mmol/L (ref 101–111)
Creatinine, Ser: 0.92 mg/dL (ref 0.61–1.24)
GFR calc Af Amer: >60 mL/min (ref >60)
GLUCOSE: 81 mg/dL (ref 65–99)
Potassium: 3.9 mmol/L (ref 3.5–5.1)
SODIUM: 138 mmol/L (ref 135–145)
TOTAL PROTEIN: 6.2 g/dL — AB (ref 6.5–8.1)
Total Bilirubin: 1.1 mg/dL (ref 0.3–1.2)

## 2016-07-27 MED ORDER — ASPIRIN 81 MG PO TBEC: 81.0000 mg | DELAYED_RELEASE_TABLET | Freq: Every day | ORAL | 2 refills | Status: DC

## 2016-07-27 MED ORDER — FAMOTIDINE 20 MG PO TABS
20.0000 mg | ORAL_TABLET | Freq: Two times a day (BID) | ORAL | Status: DC
  Administered 2016-07-27 – 2016-07-29 (×5): 20 mg via ORAL
  Filled 2016-07-27 (×5): qty 1

## 2016-07-27 MED ORDER — METOPROLOL TARTRATE 25 MG PO TABS: 12.5000 mg | ORAL_TABLET | Freq: Two times a day (BID) | ORAL | 2 refills | Status: DC

## 2016-07-27 MED ORDER — ATORVASTATIN CALCIUM 40 MG PO TABS: 40.0000 mg | ORAL_TABLET | Freq: Every day | ORAL | 2 refills | Status: DC

## 2016-07-27 NOTE — Progress Notes (Signed)
1 Day Post-Op  Subjective: Status post laparoscopic cholecystectomy for acute cholecystitis. Patient complains of pain around the drain site but no nausea no vomiting no fevers or chills  Objective: Vital signs in last 24 hours: Temp:  [96.3 F (35.7 C)-98.4 F (36.9 C)] 98.3 F (36.8 C) (08/10 0428) Pulse Rate:  [58-89] 62 (08/10 0428) Resp:  [14-29] 20 (08/10 0428) BP: (112-237)/(64-115) 140/78 (08/10 0428) SpO2:  [92 %-100 %] 99 % (08/10 0428) Weight:  [186 lb (84.4 kg)] 186 lb (84.4 kg) (08/09 1449) Last BM Date: 07/22/16  Intake/Output from previous day: 08/09 0701 - 08/10 0700 In: 1806.9 [P.O.:240; I.V.:1466.9; IV Piggyback:100] Out: 2445 [Urine:2250; Drains:190; Blood:5] Intake/Output this shift: Total I/O In: 151 [I.V.:151] Out: -   Physical exam:  Vital signs reviewed and stable abdomen is soft nontender no bile in drain wounds are dressed nontender calves  Lab Results: CBC   Recent Labs  07/26/16 0446 07/27/16 0506  WBC 9.0 12.1*  HGB 15.6 14.4  HCT 45.6 42.7  PLT 218 218   BMET  Recent Labs  07/26/16 1042 07/27/16 0506  NA 139 138  K 4.0 3.9  CL 105 103  CO2 29 31  GLUCOSE 90 81  BUN 5* 5*  CREATININE 0.75 0.92  CALCIUM 8.6* 8.4*   PT/INR No results for input(s): LABPROT, INR in the last 72 hours. ABG No results for input(s): PHART, HCO3 in the last 72 hours.  Invalid input(s): PCO2, PO2  Studies/Results: No results found.  Anti-infectives: Anti-infectives    Start     Dose/Rate Route Frequency Ordered Stop   07/24/16 0800  ceFAZolin (ANCEF) IVPB 1 g/50 mL premix     1 g 100 mL/hr over 30 Minutes Intravenous Every 6 hours 07/24/16 0751        Assessment/Plan: s/p Procedure(s): LAPAROSCOPIC CHOLECYSTECTOMY   Plan to advance diet today and saline lock probably discharge tomorrow  Florene Glen, MD, FACS  07/27/2016

## 2016-07-27 NOTE — Progress Notes (Signed)
  DR:   Anselm Jungling CONCERNING: IV to Oral Route Change Policy  RECOMMENDATION: This patient is receiving famotidine by the intravenous route.  Based on criteria approved by the Pharmacy and Therapeutics Committee, the intravenous medication(s) is/are being converted to the equivalent oral dose form(s).   DESCRIPTION: These criteria include:  The patient is eating (either orally or via tube) and/or has been taking other orally administered medications for a least 24 hours  The patient has no evidence of active gastrointestinal bleeding or impaired GI absorption (gastrectomy, short bowel, patient on TNA or NPO).  If you have questions about this conversion, please contact the Pharmacy Department  []   [phone] )  [name] []   [phone] )  [name]  []   [phone] )  [facility] []   [phone] )  [place], [facility] 07/27/2016 10:08 AM

## 2016-07-27 NOTE — Anesthesia Postprocedure Evaluation (Signed)
Anesthesia Post Note  Patient: [name]  Procedure(s) Performed: Procedure(s) (LRB): LAPAROSCOPIC CHOLECYSTECTOMY (N/A)  Patient location during evaluation: PACU Anesthesia Type: General Level of consciousness: awake and alert and oriented Pain management: pain level controlled Vital Signs Assessment: post-procedure vital signs reviewed and stable Respiratory status: spontaneous breathing, nonlabored ventilation and respiratory function stable Cardiovascular status: blood pressure returned to baseline and stable Postop Assessment: no signs of nausea or vomiting Anesthetic complications: no    Last Vitals:  Vitals:   07/26/16 2102 07/27/16 0428  BP:  140/78  Pulse: 79 62  Resp:  20  Temp:  36.8 C    Last Pain:  Vitals:   07/27/16 0542  TempSrc:   PainSc: 3                  Yenesis Even

## 2016-07-27 NOTE — Progress Notes (Signed)
[place] at [place] NAME: [name]    MR#:  [id number]  DATE OF BIRTH:  [birth year]  SUBJECTIVE:  CHIEF COMPLAINT:   Chief Complaint  Patient presents with  . Abdominal Pain    patient is resting comfortably. Pain is well controlled. S/p cholecystectomy. Tolerating diet.  REVIEW OF SYSTEMS:  Review of Systems  Constitutional: Negative for chills, fever and weight loss.  HENT: Negative for nosebleeds and sore throat.   Eyes: Negative for blurred vision.  Respiratory: Negative for cough, shortness of breath and wheezing.   Cardiovascular: Negative for chest pain, orthopnea, leg swelling and PND.  Gastrointestinal: Positive for abdominal pain and nausea. Negative for constipation, diarrhea, heartburn and vomiting.  Genitourinary: Negative for dysuria and urgency.  Musculoskeletal: Negative for back pain.  Skin: Negative for rash.  Neurological: Negative for dizziness, speech change, focal weakness and headaches.  Endo/Heme/Allergies: Does not bruise/bleed easily.  Psychiatric/Behavioral: Negative for depression.   DRUG ALLERGIES:  No Known Allergies VITALS:  Blood pressure 107/63, pulse 60, temperature 97.8 F (36.6 C), temperature source Oral, resp. rate 20, height 6' (1.829 m), weight 84.4 kg (186 lb), SpO2 98 %. PHYSICAL EXAMINATION:  Physical Exam  Constitutional: He is oriented to person, place, and time and well-developed, well-nourished, and in no distress.  HENT:  Head: Normocephalic and atraumatic.  Eyes: Conjunctivae and EOM are normal. Pupils are equal, round, and reactive to light.  Neck: Normal range of motion. Neck supple. No tracheal deviation present. No thyromegaly present.  Cardiovascular: Normal rate, regular rhythm and normal heart sounds.   Pulmonary/Chest: Effort normal and breath sounds normal. No respiratory distress. He has no wheezes. He exhibits no tenderness.  Abdominal: Soft. Bowel sounds are  normal. He exhibits no distension. There is tenderness in the right upper quadrant.  Musculoskeletal: Normal range of motion.  Neurological: He is alert and oriented to person, place, and time. No cranial nerve deficit.  Skin: Skin is warm and dry. No rash noted.  Psychiatric: Mood and affect normal.   LABORATORY PANEL:   CBC  Recent Labs Lab 07/27/16 0506  WBC 12.1*  HGB 14.4  HCT 42.7  PLT 218   ------------------------------------------------------------------------------------------------------------------ Chemistries   Recent Labs Lab 07/27/16 0506  NA 138  K 3.9  CL 103  CO2 31  GLUCOSE 81  BUN 5*  CREATININE 0.92  CALCIUM 8.4*  AST 109*  ALT 52  ALKPHOS 87  BILITOT 1.1   RADIOLOGY:  No results found. ASSESSMENT AND PLAN:  55 year old male with a known history of hypertension, MI, CVA is seen in consultation for Preop medical clearance  * Hypertension  -  low dose beta blocker  started with holding parameters if blood pressure/HR can tolerate - Echo was performed per cardiology and is showing EF of 55% - BP stable now.  * GERd  Pepcid 20 mg IV evry 12 hours  * Coronary artery disease, h/o MI and CVA -   Continue aspirin   -  continue lipitor, fasting lipid profile showed LDL 106 - Cardio following  * Tobacco abuse - He has had an 80-pack-year smoking history, Counseled for about 3 minutes, he is agreeable for nicotine replacement therapy.  started nicotine patch while here in the hospital  * Acute cholecystitis - s/p laparoscopic cholecystectomy  by Dr. Burt Knack. - Further management per surgery - am labs   As pt is stable - I will sign off from the case- and given prescriptions  for 2 months as he does not have PMD. CSW may need to help set up charity care clinic and medication management.    All the records are reviewed and case discussed with Care Management/Social Worker. Management plans discussed with the patient, family and they are in  agreement.  CODE STATUS: FULL CODE  TOTAL TIME TAKING CARE OF THIS PATIENT: 30 minutes.   More than 50% of the time was spent in counseling/coordination of care: Ilean Skill M.D on 07/27/2016 at 4:26 PM  Between 7am to 6pm - Pager - [phone]  After 6pm go to [email or web address] - [employer] [place] [facility]  Office  [phone]  CC: Primary care physician; No PCP Per Patient  Note: This dictation was prepared with Dragon dictation along with smaller phrase technology. Any transcriptional errors that result from this process are unintentional.

## 2016-07-28 LAB — COMPREHENSIVE METABOLIC PANEL
ALBUMIN: 3.2 g/dL — AB (ref 3.5–5.0)
ALT: 81 U/L — AB (ref 17–63)
ANION GAP: 6 (ref 5–15)
AST: 108 U/L — AB (ref 15–41)
Alkaline Phosphatase: 156 U/L — ABNORMAL HIGH (ref 38–126)
BUN: 7 mg/dL (ref 6–20)
CALCIUM: 8.4 mg/dL — AB (ref 8.9–10.3)
CO2: 29 mmol/L (ref 22–32)
Chloride: 104 mmol/L (ref 101–111)
Creatinine, Ser: 0.69 mg/dL (ref 0.61–1.24)
GFR calc Af Amer: >60 mL/min (ref >60)
GFR calc non Af Amer: >60 mL/min (ref >60)
Glucose, Bld: 98 mg/dL (ref 65–99)
POTASSIUM: 3.5 mmol/L (ref 3.5–5.1)
SODIUM: 139 mmol/L (ref 135–145)
Total Bilirubin: 1.4 mg/dL — ABNORMAL HIGH (ref 0.3–1.2)
Total Protein: 6.3 g/dL — ABNORMAL LOW (ref 6.5–8.1)

## 2016-07-28 LAB — CBC WITH DIFFERENTIAL/PLATELET
BASOS ABS: 0.1 10*3/uL (ref 0–0.1)
BASOS PCT: 1 %
EOS ABS: 0.4 10*3/uL (ref 0–0.7)
EOS PCT: 4 %
HCT: 43.2 % (ref 40.0–52.0)
Hemoglobin: 14.9 g/dL (ref 13.0–18.0)
Lymphocytes Relative: 24 %
Lymphs Abs: 2.2 10*3/uL (ref 1.0–3.6)
MCH: 30.3 pg (ref 26.0–34.0)
MCHC: 34.5 g/dL (ref 32.0–36.0)
MCV: 87.9 fL (ref 80.0–100.0)
Monocytes Absolute: 1 10*3/uL (ref 0.2–1.0)
Monocytes Relative: 11 %
NEUTROS PCT: 60 %
Neutro Abs: 5.8 10*3/uL (ref 1.4–6.5)
PLATELETS: 206 10*3/uL (ref 150–440)
RBC: 4.91 MIL/uL (ref 4.40–5.90)
RDW: 14.6 % — ABNORMAL HIGH (ref 11.5–14.5)
WBC: 9.5 10*3/uL (ref 3.8–10.6)

## 2016-07-28 LAB — SURGICAL PATHOLOGY

## 2016-07-28 MED ORDER — SENNOSIDES-DOCUSATE SODIUM 8.6-50 MG PO TABS
1.0000 | ORAL_TABLET | Freq: Every day | ORAL | Status: DC
  Administered 2016-07-28: 1 via ORAL
  Filled 2016-07-28: qty 1

## 2016-07-28 NOTE — Progress Notes (Signed)
2 Days Post-Op  Subjective: S/p lap chole. Feels well, tol diet  Objective: Vital signs in last 24 hours: Temp:  [97.8 F (36.6 C)-99.9 F (37.7 C)] 98.1 F (36.7 C) (08/11 0448) Pulse Rate:  [60-81] 71 (08/11 0448) Resp:  [18-20] 18 (08/11 0448) BP: (107-153)/(63-73) 130/73 (08/11 0448) SpO2:  [96 %-100 %] 96 % (08/11 0448) Last BM Date: 07/21/16  Intake/Output from previous day: 08/10 0701 - 08/11 0700 In: 151 [I.V.:151] Out: 3990 [Urine:3925; Drains:65] Intake/Output this shift: No intake/output data recorded.  Physical exam:  Low grade temp last night No bile in drain Wounds clean nt calves  Lab Results: CBC   Recent Labs  07/27/16 0506 07/28/16 0502  WBC 12.1* 9.5  HGB 14.4 14.9  HCT 42.7 43.2  PLT 218 206   BMET  Recent Labs  07/27/16 0506 07/28/16 0502  NA 138 139  K 3.9 3.5  CL 103 104  CO2 31 29  GLUCOSE 81 98  BUN 5* 7  CREATININE 0.92 0.69  CALCIUM 8.4* 8.4*   PT/INR No results for input(s): LABPROT, INR in the last 72 hours. ABG No results for input(s): PHART, HCO3 in the last 72 hours.  Invalid input(s): PCO2, PO2  Studies/Results: No results found.  Anti-infectives: Anti-infectives    Start     Dose/Rate Route Frequency Ordered Stop   07/24/16 0800  ceFAZolin (ANCEF) IVPB 1 g/50 mL premix     1 g 100 mL/hr over 30 Minutes Intravenous Every 6 hours 07/24/16 0751        Assessment/Plan: s/p Procedure(s): LAPAROSCOPIC CHOLECYSTECTOMY   Labs reviewed Doing well, will cont IV abx Poss home later today or tomorrow  Florene Glen, MD, FACS  07/28/2016

## 2016-07-29 MED ORDER — HYDROCODONE-ACETAMINOPHEN 5-325 MG PO TABS: 1.0000 | ORAL_TABLET | ORAL | 0 refills | Status: DC | PRN

## 2016-07-29 MED ORDER — CEPHALEXIN 500 MG PO CAPS: 500.0000 mg | ORAL_CAPSULE | Freq: Four times a day (QID) | ORAL | 1 refills | Status: DC

## 2016-07-29 NOTE — Discharge Instructions (Signed)
Remove dressing in 24 hours. °May shower in 24 hours. °Leave paper strips in place. °Resume all home medications. °Follow-up with Dr. Joleah Kosak in 10 days. °

## 2016-07-29 NOTE — Progress Notes (Signed)
3 Days Post-Op  Subjective: Feels well tolerating a diet no fevers or chills minimal abdominal pain Objective: Vital signs in last 24 hours: Temp:  [97.9 F (36.6 C)-98.8 F (37.1 C)] 97.9 F (36.6 C) (08/12 0620) Pulse Rate:  [57-68] 57 (08/12 0620) Resp:  [18-20] 20 (08/12 0622) BP: (136-156)/(72-80) 156/80 (08/12 0620) SpO2:  [97 %-98 %] 98 % (08/12 0620) Last BM Date: 07/22/16  Intake/Output from previous day: 08/11 0701 - 08/12 0700 In: 480 [P.O.:480] Out: 2137.5 [Urine:2100; Drains:37.5] Intake/Output this shift: Total I/O In: -  Out: 10 [Drains:10]  Physical exam:  Wounds are clean no erythema no drainage no bile in drain drain is removed at this point her dressing is placed nontender calves  Lab Results: CBC   Recent Labs  07/27/16 0506 07/28/16 0502  WBC 12.1* 9.5  HGB 14.4 14.9  HCT 42.7 43.2  PLT 218 206   BMET  Recent Labs  07/27/16 0506 07/28/16 0502  NA 138 139  K 3.9 3.5  CL 103 104  CO2 31 29  GLUCOSE 81 98  BUN 5* 7  CREATININE 0.92 0.69  CALCIUM 8.4* 8.4*   PT/INR No results for input(s): LABPROT, INR in the last 72 hours. ABG No results for input(s): PHART, HCO3 in the last 72 hours.  Invalid input(s): PCO2, PO2  Studies/Results: No results found.  Anti-infectives: Anti-infectives    Start     Dose/Rate Route Frequency Ordered Stop   07/29/16 0000  cephALEXin (KEFLEX) 500 MG capsule     500 mg Oral 4 times daily 07/29/16 0711     07/24/16 0800  ceFAZolin (ANCEF) IVPB 1 g/50 mL premix     1 g 100 mL/hr over 30 Minutes Intravenous Every 6 hours 07/24/16 0751        Assessment/Plan: s/p Procedure(s): LAPAROSCOPIC CHOLECYSTECTOMY   Patient doing very well at this point drain is been removed he will be discharged in stable condition to follow-up in 10 days  Florene Glen, MD, FACS  07/29/2016

## 2016-07-29 NOTE — Discharge Summary (Signed)
Physician Discharge Summary  Patient ID: Khy Ryba MRN: [id number] DOB/AGE: 02-24-1961 55 y.o.  Admit date: 07/24/2016 Discharge date: 07/29/2016   Discharge Diagnoses:  Active Problems:   Acute cholecystitis   Procedures:Laparoscopic cholecystectomy  Hospital Course: This a patient with acute cholecystitis admitted to the emergency room for laparoscopic cholecystectomy. However the patient was determined to be noncompliant with multiple medical problems mostly hypertension and cardiac related with internal medicine cardiology consult for the patient and cleared him for surgery. He made a non-, located postoperative recovery and is discharged in stable condition to follow-up in my office on Wednesday for reevaluation. His drain is been removed and he is given dressing instructions as well as showering instructions.  Consults: Cardiology, internal medicine  Disposition:      Medication List    TAKE these medications   aspirin 81 MG EC tablet Take 1 tablet (81 mg total) by mouth daily.   atorvastatin 40 MG tablet Commonly known as:  LIPITOR Take 1 tablet (40 mg total) by mouth daily at 6 PM.   cephALEXin 500 MG capsule Commonly known as:  KEFLEX Take 1 capsule (500 mg total) by mouth 4 (four) times daily.   HYDROcodone-acetaminophen 5-325 MG tablet Commonly known as:  NORCO/VICODIN Take 1-2 tablets by mouth every 4 (four) hours as needed for moderate pain.   metoprolol tartrate 25 MG tablet Commonly known as:  LOPRESSOR Take 0.5 tablets (12.5 mg total) by mouth 2 (two) times daily.      Follow-up Information    Phoebe Perch, MD Follow up in 6 day(s).   Specialty:  Surgery Contact information: [address] [place] [address] [phone]           Florene Glen, MD, FACS

## 2017-12-07 DIAGNOSIS — I6523 Occlusion and stenosis of bilateral carotid arteries: Secondary | ICD-10-CM | POA: Insufficient documentation

## 2017-12-07 DIAGNOSIS — I10 Essential (primary) hypertension: Secondary | ICD-10-CM | POA: Insufficient documentation

## 2017-12-07 DIAGNOSIS — R0602 Shortness of breath: Secondary | ICD-10-CM | POA: Insufficient documentation

## 2018-01-03 DIAGNOSIS — I70219 Atherosclerosis of native arteries of extremities with intermittent claudication, unspecified extremity: Secondary | ICD-10-CM | POA: Insufficient documentation

## 2018-03-01 ENCOUNTER — Ambulatory Visit (INDEPENDENT_AMBULATORY_CARE_PROVIDER_SITE_OTHER): Payer: Medicaid Other | Admitting: Vascular Surgery

## 2018-03-01 ENCOUNTER — Encounter (INDEPENDENT_AMBULATORY_CARE_PROVIDER_SITE_OTHER): Payer: Self-pay | Admitting: Vascular Surgery

## 2018-03-01 VITALS — BP 98/56 | HR 75 | Resp 13 | Ht 72.0 in | Wt 207.0 lb

## 2018-03-01 DIAGNOSIS — Z720 Tobacco use: Secondary | ICD-10-CM

## 2018-03-01 DIAGNOSIS — I70219 Atherosclerosis of native arteries of extremities with intermittent claudication, unspecified extremity: Secondary | ICD-10-CM | POA: Insufficient documentation

## 2018-03-01 DIAGNOSIS — I70213 Atherosclerosis of native arteries of extremities with intermittent claudication, bilateral legs: Secondary | ICD-10-CM

## 2018-03-01 DIAGNOSIS — I10 Essential (primary) hypertension: Secondary | ICD-10-CM | POA: Insufficient documentation

## 2018-03-01 NOTE — Progress Notes (Signed)
Patient ID: [name], male   DOB: January 18, 1961, 57 y.o.   MRN: [id number]  Chief Complaint  Patient presents with  . New Patient (Initial Visit)    Claudication    HPI [name] is a 57 y.o. male.  I am asked to see the patient by Dr. Nehemiah Massed for evaluation of claudication.  The patient reports several months of worsening pain in his legs with activity.  This affects both lower extremities.  The patient reports no ulceration or infection.  He does say that he gets pain in his toes and forefoot sometimes at night.  The legs are affected about the same.  He says he can only walk about 50-75 feet before his legs become very heavy and tired.  Generally, stopping to rest helps that discomfort.  More strenuous activity brings on the pain sooner.  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  His cardiologist referred him when he discussed how limiting his symptoms were in his legs.  There is no clear inciting event or causative factor that started the symptoms.   Past Medical History:  Diagnosis Date  . CVA (cerebral infarction)   . Hypertension   . MI (myocardial infarction) [facility])     Past Surgical History:  Procedure Laterality Date  . CHOLECYSTECTOMY N/A 07/26/2016   Procedure: LAPAROSCOPIC CHOLECYSTECTOMY;  Surgeon: Florene Glen, MD;  Location: [facility] ORS;  Service: General;  Laterality: N/A;  . CORONARY ARTERY BYPASS GRAFT      Family History No bleeding disorders, clotting disorders, aneurysms, or porphyrias  Social History Social History   Tobacco Use  . Smoking status: Current Every Day Smoker    Packs/day: 2.00    Years: 43.00    Pack years: 86.00    Types: Cigarettes  . Smokeless tobacco: Never Used  Substance Use Topics  . Alcohol use: No  . Drug use: No     No Known Allergies  Current Outpatient Medications  Medication Sig Dispense Refill  . albuterol (PROVENTIL HFA) 108 (90 Base) MCG/ACT inhaler Inhale  into the lungs.    Marland Kitchen aspirin EC 81 MG EC tablet Take 1 tablet (81 mg total) by mouth daily. 30 tablet 2  . atorvastatin (LIPITOR) 40 MG tablet Take 1 tablet (40 mg total) by mouth daily at 6 PM. 30 tablet 2  . buPROPion (WELLBUTRIN XL) 150 MG 24 hr tablet Take by mouth.    . diclofenac sodium (VOLTAREN) 1 % GEL Apply topically 4 (four) times daily.    . hydrochlorothiazide (MICROZIDE) 12.5 MG capsule HYDROCHLOROTHIAZIDE 12.5 MG CAPS    . HYDROcodone-acetaminophen (NORCO/VICODIN) 5-325 MG tablet Take 1-2 tablets by mouth every 4 (four) hours as needed for moderate pain. 30 tablet 0  . lisinopril (PRINIVIL,ZESTRIL) 20 MG tablet Take by mouth.    . metoprolol tartrate (LOPRESSOR) 25 MG tablet Take 0.5 tablets (12.5 mg total) by mouth 2 (two) times daily. 60 tablet 2  . cephALEXin (KEFLEX) 500 MG capsule Take 1 capsule (500 mg total) by mouth 4 (four) times daily. (Patient not taking: Reported on 03/01/2018) 40 capsule 1   No current facility-administered medications for this visit.       REVIEW OF SYSTEMS (Negative unless checked)  Constitutional: [] Weight loss  [] Fever  [] Chills Cardiac: [] Chest pain   [] Chest pressure   [] Palpitations   [] Shortness of breath when laying flat   [] Shortness of breath at rest   [] Shortness  of breath with exertion. Vascular:  [x] Pain in legs with walking   [] Pain in legs at rest   [] Pain in legs when laying flat   [x] Claudication   [] Pain in feet when walking  [] Pain in feet at rest  [] Pain in feet when laying flat   [] History of DVT   [] Phlebitis   [] Swelling in legs   [] Varicose veins   [] Non-healing ulcers Pulmonary:   [] Uses home oxygen   [] Productive cough   [] Hemoptysis   [] Wheeze  [] COPD   [] Asthma Neurologic:  [] Dizziness  [] Blackouts   [] Seizures   [x] History of stroke   [] History of TIA  [] Aphasia   [] Temporary blindness   [] Dysphagia   [] Weakness or numbness in arms   [] Weakness or numbness in legs Musculoskeletal:  [x] Arthritis   [] Joint swelling    [] Joint pain   [] Low back pain Hematologic:  [] Easy bruising  [] Easy bleeding   [] Hypercoagulable state   [] Anemic  [] Hepatitis Gastrointestinal:  [] Blood in stool   [] Vomiting blood  [x] Gastroesophageal reflux/heartburn   [] Abdominal pain Genitourinary:  [] Chronic kidney disease   [] Difficult urination  [] Frequent urination  [] Burning with urination   [] Hematuria Skin:  [] Rashes   [] Ulcers   [] Wounds Psychological:  [] History of anxiety   []  History of major depression.    Physical Exam BP (!) 98/56 (BP Location: Right Arm, Patient Position: Sitting)   Pulse 75   Resp 13   Ht 6' (1.829 m)   Wt 93.9 kg (207 lb)   BMI 28.07 kg/m  Gen:  WD/WN, NAD. Appears older than stated age. Head: [place]/AT, No temporalis wasting.  Ear/Nose/Throat: Hearing grossly intact, nares w/o erythema or drainage, oropharynx w/o Erythema/Exudate Eyes: Conjunctiva clear, sclera non-icteric  Neck: trachea midline.  NoJVD.  Pulmonary:  Good air movement, respirations not labored, no use of accessory muscles Cardiac: RRR Vascular:  Vessel Right Left  Radial Palpable Palpable                          PT 1+ Palpable Not Palpable  DP Not Palpable Trace Palpable   Musculoskeletal: M/S 5/5 throughout.  Extremities without ischemic changes.  No deformity or atrophy. Trace LE edema. Neurologic: Sensation grossly intact in extremities.  Symmetrical.  Speech is a little slow. Motor exam as listed above. Psychiatric: Judgment and insight seem fair, Mood & affect appropriate for pt's clinical situation. Dermatologic: No rashes or ulcers noted.  No cellulitis or open wounds.  Radiology No results found.  Labs No results found for this or any previous visit (from the past 2160 hour(s)).  Assessment/Plan:  Tobacco abuse We had a discussion for approximately 3 minutes regarding the absolute need for smoking cessation due to the deleterious nature of tobacco on the vascular system. We discussed the tobacco use would  diminish patency of any intervention, and likely significantly worsen progressio of disease. We discussed multiple agents for quitting including replacement therapy or medications to reduce cravings such as Chantix. The patient voices their understanding of the importance of smoking cessation.   Hypertension blood pressure control important in reducing the progression of atherosclerotic disease. On appropriate oral medications.   Atherosclerosis of native arteries of extremity with intermittent claudication (HCC) Recommend:  Patient should undergo arterial duplex of the lower extremity ASAP because there has been a significant deterioration in the patient's lower extremity symptoms.  The patient states they are having increased pain and a marked decrease in the distance that they can walk.  The risks and benefits as well as the alternatives were discussed in detail with the patient.  All questions were answered.  Patient agrees to proceed and understands this could be a prelude to angiography and intervention.  The patient will follow up with me in the office to review the studies.       Leotis Pain 03/01/2018, 3:00 PM   This note was created with Dragon medical transcription system.  Any errors from dictation are unintentional.

## 2018-03-01 NOTE — Assessment & Plan Note (Signed)
Recommend:  Patient should undergo arterial duplex of the lower extremity ASAP because there has been a significant deterioration in the patient's lower extremity symptoms.  The patient states they are having increased pain and a marked decrease in the distance that they can walk.  The risks and benefits as well as the alternatives were discussed in detail with the patient.  All questions were answered.  Patient agrees to proceed and understands this could be a prelude to angiography and intervention.  The patient will follow up with me in the office to review the studies.  

## 2018-03-01 NOTE — Patient Instructions (Signed)

## 2018-03-01 NOTE — Assessment & Plan Note (Signed)

## 2018-03-01 NOTE — Assessment & Plan Note (Signed)
blood pressure control important in reducing the progression of atherosclerotic disease. On appropriate oral medications.  

## 2018-03-12 DIAGNOSIS — Z8673 Personal history of transient ischemic attack (TIA), and cerebral infarction without residual deficits: Secondary | ICD-10-CM | POA: Insufficient documentation

## 2018-03-12 DIAGNOSIS — R4189 Other symptoms and signs involving cognitive functions and awareness: Secondary | ICD-10-CM | POA: Insufficient documentation

## 2018-03-27 ENCOUNTER — Ambulatory Visit (INDEPENDENT_AMBULATORY_CARE_PROVIDER_SITE_OTHER): Payer: Medicaid Other

## 2018-03-27 ENCOUNTER — Ambulatory Visit (INDEPENDENT_AMBULATORY_CARE_PROVIDER_SITE_OTHER): Payer: Medicaid Other | Admitting: Vascular Surgery

## 2018-03-27 ENCOUNTER — Encounter (INDEPENDENT_AMBULATORY_CARE_PROVIDER_SITE_OTHER): Payer: Self-pay | Admitting: Vascular Surgery

## 2018-03-27 VITALS — BP 141/79 | HR 81 | Resp 16 | Ht 72.0 in | Wt 205.0 lb

## 2018-03-27 DIAGNOSIS — E785 Hyperlipidemia, unspecified: Secondary | ICD-10-CM | POA: Insufficient documentation

## 2018-03-27 DIAGNOSIS — Z720 Tobacco use: Secondary | ICD-10-CM | POA: Diagnosis not present

## 2018-03-27 DIAGNOSIS — I10 Essential (primary) hypertension: Secondary | ICD-10-CM

## 2018-03-27 DIAGNOSIS — I70213 Atherosclerosis of native arteries of extremities with intermittent claudication, bilateral legs: Secondary | ICD-10-CM

## 2018-03-27 NOTE — Progress Notes (Signed)
Subjective:    Patient ID: [name], male    DOB: 04/05/1961, 57 y.o.   MRN: [id number] Chief Complaint  Patient presents with  . Follow-up    pt conv abi,bil arterial   Patient presents to review vascular studies.  The patient was originally seen on March 01, 2018 for evaluation of bilateral lower extremity pain.  The patient notes that his discomfort is mostly located to his bilateral feet.  The patient notes that he has never been evaluated by a podiatrist.  Patient denies any claudication-like symptoms, rest pain or ulceration to the bilateral lower extremity.  The patient notes that his bilateral feet will hurt him with activity / ambulation.  The patient underwent a bilateral ABI which was notable for no evidence of significant right or left lower extremity arterial disease.  The right toe brachial index is normal the left toe brachial index is abnormal.  Right lower extremity arterial duplex is notable for triphasic blood flow transitioning to biphasic at the posterior tibial and peroneal artery.  Anterior tibial artery is triphasic.  Left lower extremity: Triphasic blood flow transitioning to biphasic tibials.  The patient denies any fever, nausea vomiting.  Review of Systems  Constitutional: Negative.   HENT: Negative.   Eyes: Negative.   Respiratory: Negative.   Cardiovascular:       Bilateral foot pain  Gastrointestinal: Negative.   Endocrine: Negative.   Genitourinary: Negative.   Musculoskeletal: Negative.   Skin: Negative.   Allergic/Immunologic: Negative.   Neurological: Negative.   Hematological: Negative.   Psychiatric/Behavioral: Negative.       Objective:   Physical Exam  Constitutional: He is oriented to person, place, and time. He appears well-developed and well-nourished. No distress.  HENT:  Head: Normocephalic.  Eyes: Pupils are equal, round, and reactive to light. Conjunctivae are normal.  Neck: Normal range of motion.  Cardiovascular: Normal rate,  regular rhythm, normal heart sounds and intact distal pulses.  Pulses:      Radial pulses are 2+ on the right side, and 2+ on the left side.  Hard to palpate pedal pulses however the bilateral feet are warm  Pulmonary/Chest: Effort normal and breath sounds normal.  Musculoskeletal: Normal range of motion. He exhibits no edema.  Neurological: He is alert and oriented to person, place, and time.  Skin: Skin is warm and dry. He is not diaphoretic.  Psychiatric: He has a normal mood and affect. His behavior is normal. Judgment and thought content normal.  Vitals reviewed.  BP (!) 141/79 (BP Location: Right Arm)   Pulse 81   Resp 16   Ht 6' (1.829 m)   Wt 205 lb (93 kg)   BMI 27.80 kg/m   Past Medical History:  Diagnosis Date  . CVA (cerebral infarction)   . Hypertension   . MI (myocardial infarction) [facility])    Social History   Socioeconomic History  . Marital status: Divorced    Spouse name: Not on file  . Number of children: Not on file  . Years of education: Not on file  . Highest education level: Not on file  Occupational History  . Not on file  Social Needs  . Financial resource strain: Not on file  . Food insecurity:    Worry: Not on file    Inability: Not on file  . Transportation needs:    Medical: Not on file    Non-medical: Not on file  Tobacco Use  . Smoking status: Current Every Day Smoker  Packs/day: 2.00    Years: 43.00    Pack years: 86.00    Types: Cigarettes  . Smokeless tobacco: Never Used  Substance and Sexual Activity  . Alcohol use: No  . Drug use: No  . Sexual activity: Not on file  Lifestyle  . Physical activity:    Days per week: Not on file    Minutes per session: Not on file  . Stress: Not on file  Relationships  . Social connections:    Talks on phone: Not on file    Gets together: Not on file    Attends religious service: Not on file    Active member of club or organization: Not on file    Attends meetings of clubs or  organizations: Not on file    Relationship status: Not on file  . Intimate partner violence:    Fear of current or ex partner: Not on file    Emotionally abused: Not on file    Physically abused: Not on file    Forced sexual activity: Not on file  Other Topics Concern  . Not on file  Social History Narrative  . Not on file   Past Surgical History:  Procedure Laterality Date  . CHOLECYSTECTOMY N/A 07/26/2016   Procedure: LAPAROSCOPIC CHOLECYSTECTOMY;  Surgeon: Florene Glen, MD;  Location: [facility] ORS;  Service: General;  Laterality: N/A;  . CORONARY ARTERY BYPASS GRAFT     No family history on file.  No Known Allergies     Assessment & Plan:  Patient presents to review vascular studies.  The patient was originally seen on March 01, 2018 for evaluation of bilateral lower extremity pain.  The patient notes that his discomfort is mostly located to his bilateral feet.  The patient notes that he has never been evaluated by a podiatrist.  Patient denies any claudication-like symptoms, rest pain or ulceration to the bilateral lower extremity.  The patient notes that his bilateral feet will hurt him with activity / ambulation.  The patient underwent a bilateral ABI which was notable for no evidence of significant right or left lower extremity arterial disease.  The right toe brachial index is normal the left toe brachial index is abnormal.  Right lower extremity arterial duplex is notable for triphasic blood flow transitioning to biphasic at the posterior tibial and peroneal artery.  Anterior tibial artery is triphasic.  Left lower extremity: Triphasic blood flow transitioning to biphasic tibials.  The patient denies any fever, nausea vomiting.  1. Atherosclerosis of native artery of both lower extremities with intermittent claudication ([place]) - Stable Patient with normal arterial blood flow to the bilateral lower extremity as per ABI/arterial duplex Patient may have small vessel disease to the  bilateral feet -we discussed moving forward with a diagnostic angiogram however at this time the patient feels that his discomfort does not warrant moving forward with this type of procedure. He would like to return in 6 months and repeat studies. I will refer him to a podiatrist for a second opinion I have discussed with the patient at length the risk factors for and pathogenesis of atherosclerotic disease and encouraged a healthy diet, regular exercise regimen and blood pressure / glucose control.  The patient was encouraged to call the office in the interim if he experiences any claudication like symptoms, rest pain or ulcers to his feet / toes.  - VAS [place] ABI WITH/WO TBI; Future - VAS [place] LOWER EXTREMITY ARTERIAL DUPLEX; Future - Ambulatory referral to Podiatry  2. Tobacco abuse - Stable We had a discussion for approximately five minutes regarding the absolute need for smoking cessation due to the deleterious nature of tobacco on the vascular system. We discussed the tobacco use would diminish patency of any intervention, and likely significantly worsen progressio of disease. We discussed multiple agents for quitting including replacement therapy or medications to reduce cravings such as Chantix. The patient voices their understanding of the importance of smoking cessation.  3. Hyperlipidemia, unspecified hyperlipidemia type - Stable Encouraged good control as its slows the progression of atherosclerotic disease  4. Essential hypertension - Stable Encouraged good control as its slows the progression of atherosclerotic disease  Current Outpatient Medications on File Prior to Visit  Medication Sig Dispense Refill  . albuterol (PROVENTIL HFA) 108 (90 Base) MCG/ACT inhaler Inhale into the lungs.    Marland Kitchen aspirin EC 81 MG EC tablet Take 1 tablet (81 mg total) by mouth daily. 30 tablet 2  . atorvastatin (LIPITOR) 40 MG tablet Take 1 tablet (40 mg total) by mouth daily at 6 PM. 30 tablet 2  .  buPROPion (WELLBUTRIN XL) 150 MG 24 hr tablet Take by mouth.    . diclofenac sodium (VOLTAREN) 1 % GEL Apply topically 4 (four) times daily.    . hydrochlorothiazide (MICROZIDE) 12.5 MG capsule HYDROCHLOROTHIAZIDE 12.5 MG CAPS    . lisinopril (PRINIVIL,ZESTRIL) 20 MG tablet Take by mouth.    . metoprolol tartrate (LOPRESSOR) 25 MG tablet Take 0.5 tablets (12.5 mg total) by mouth 2 (two) times daily. 60 tablet 2  . cephALEXin (KEFLEX) 500 MG capsule Take 1 capsule (500 mg total) by mouth 4 (four) times daily. (Patient not taking: Reported on 03/01/2018) 40 capsule 1  . HYDROcodone-acetaminophen (NORCO/VICODIN) 5-325 MG tablet Take 1-2 tablets by mouth every 4 (four) hours as needed for moderate pain. (Patient not taking: Reported on 03/27/2018) 30 tablet 0   No current facility-administered medications on file prior to visit.    There are no Patient Instructions on file for this visit. No follow-ups on file.  Madai Nuccio A Rodgers Likes, PA-C

## 2018-04-03 ENCOUNTER — Encounter (INDEPENDENT_AMBULATORY_CARE_PROVIDER_SITE_OTHER): Payer: Self-pay

## 2018-08-15 ENCOUNTER — Other Ambulatory Visit: Payer: Self-pay | Admitting: Acute Care

## 2018-08-15 DIAGNOSIS — R413 Other amnesia: Secondary | ICD-10-CM

## 2018-08-22 ENCOUNTER — Ambulatory Visit: Payer: Medicaid Other | Attending: Neurology

## 2018-08-22 DIAGNOSIS — G4733 Obstructive sleep apnea (adult) (pediatric): Secondary | ICD-10-CM | POA: Insufficient documentation

## 2018-08-28 ENCOUNTER — Encounter (INDEPENDENT_AMBULATORY_CARE_PROVIDER_SITE_OTHER): Payer: Self-pay

## 2018-08-29 ENCOUNTER — Ambulatory Visit: Payer: Medicaid Other

## 2018-08-30 ENCOUNTER — Encounter (INDEPENDENT_AMBULATORY_CARE_PROVIDER_SITE_OTHER): Payer: Self-pay

## 2018-09-27 ENCOUNTER — Ambulatory Visit (INDEPENDENT_AMBULATORY_CARE_PROVIDER_SITE_OTHER): Payer: Medicaid Other | Admitting: Vascular Surgery

## 2018-09-27 ENCOUNTER — Encounter (INDEPENDENT_AMBULATORY_CARE_PROVIDER_SITE_OTHER): Payer: Medicaid Other

## 2018-10-08 DIAGNOSIS — I350 Nonrheumatic aortic (valve) stenosis: Secondary | ICD-10-CM | POA: Insufficient documentation

## 2018-10-14 DIAGNOSIS — G479 Sleep disorder, unspecified: Secondary | ICD-10-CM | POA: Insufficient documentation

## 2018-10-14 DIAGNOSIS — R413 Other amnesia: Secondary | ICD-10-CM | POA: Insufficient documentation

## 2018-10-18 ENCOUNTER — Ambulatory Visit: Payer: Medicaid Other | Admitting: Urology

## 2018-10-18 ENCOUNTER — Encounter: Payer: Self-pay | Admitting: Urology

## 2018-10-18 VITALS — BP 134/73 | HR 97 | Ht 72.0 in | Wt 212.8 lb

## 2018-10-18 DIAGNOSIS — N400 Enlarged prostate without lower urinary tract symptoms: Secondary | ICD-10-CM

## 2018-10-18 LAB — BLADDER SCAN AMB NON-IMAGING

## 2018-10-18 MED ORDER — TAMSULOSIN HCL 0.4 MG PO CAPS: 0.4000 mg | ORAL_CAPSULE | Freq: Every day | ORAL | 1 refills | Status: DC

## 2018-10-18 NOTE — Progress Notes (Signed)
10/18/2018 9:28 AM   [name] 14-Mar-1961 [id number]  Referring provider: [facility], [place] [place], [place] [address]  Chief Complaint  Patient presents with  . Establish Care    HPI: 57 year old male referred for evaluation of lower urinary tract symptoms and erectile dysfunction.  He complains of urinary hesitancy, decreased force and caliber urinary stream and urgency since he had a CVA approximately 6-7 years ago.  From record review it looks like his only treatment has been prazosin 1 mg daily.  He is not sure if he is still taking this medication.  He denies dysuria or gross hematuria.  Denies flank, abdominal, pelvic or scrotal pain.  IPSS completed today was 19/35 with his most bothersome symptoms being weak stream, straining, urgency.  He also complains of difficulty achieving and maintaining an erection.  Organic risk factors include tobacco use, peripheral vascular disease, coronary artery disease, beta-blocker medication and hypertension.  He has tried sildenafil up to 100 mg which has not been effective.   PMH: Past Medical History:  Diagnosis Date  . CVA (cerebral infarction)   . Hypertension   . MI (myocardial infarction) [facility])     Surgical History: Past Surgical History:  Procedure Laterality Date  . CHOLECYSTECTOMY N/A 07/26/2016   Procedure: LAPAROSCOPIC CHOLECYSTECTOMY;  Surgeon: Florene Glen, MD;  Location: [facility] ORS;  Service: General;  Laterality: N/A;  . CORONARY ARTERY BYPASS GRAFT      Home Medications:  Allergies as of 10/18/2018   No Known Allergies     Medication List        Accurate as of 10/18/18  9:28 AM. Always use your most recent med list.          aspirin 81 MG EC tablet Take 1 tablet (81 mg total) by mouth daily.   atorvastatin 40 MG tablet Commonly known as:  LIPITOR Take 1 tablet (40 mg total) by mouth daily at 6 PM.   buPROPion 150 MG 24 hr tablet Commonly known as:  WELLBUTRIN XL Take by  mouth.   cephALEXin 500 MG capsule Commonly known as:  KEFLEX Take 1 capsule (500 mg total) by mouth 4 (four) times daily.   diclofenac sodium 1 % Gel Commonly known as:  VOLTAREN Apply topically 4 (four) times daily.   donepezil 10 MG tablet Commonly known as:  ARICEPT Take by mouth.   hydrochlorothiazide 12.5 MG capsule Commonly known as:  MICROZIDE HYDROCHLOROTHIAZIDE 12.5 MG CAPS   HYDROcodone-acetaminophen 5-325 MG tablet Commonly known as:  NORCO/VICODIN Take 1-2 tablets by mouth every 4 (four) hours as needed for moderate pain.   lisinopril 20 MG tablet Commonly known as:  PRINIVIL,ZESTRIL Take by mouth.   metoprolol tartrate 25 MG tablet Commonly known as:  LOPRESSOR Take 0.5 tablets (12.5 mg total) by mouth 2 (two) times daily.   PROVENTIL HFA 108 (90 Base) MCG/ACT inhaler Generic drug:  albuterol Inhale into the lungs.   traZODone 50 MG tablet Commonly known as:  DESYREL Take by mouth.       Allergies: No Known Allergies  Family History: No family history on file.  Social History:  reports that he has been smoking cigarettes. He has a 86.00 pack-year smoking history. He has never used smokeless tobacco. He reports that he does not drink alcohol or use drugs.  ROS: UROLOGY Frequent Urination?: No Hard to postpone urination?: Yes Burning/pain with urination?: No Get up at night to urinate?: No Leakage of urine?: No Urine stream starts  and stops?: No Trouble starting stream?: No Do you have to strain to urinate?: No Blood in urine?: No Urinary tract infection?: No Sexually transmitted disease?: No Injury to kidneys or bladder?: No Painful intercourse?: No Weak stream?: Yes Erection problems?: Yes Penile pain?: No  Gastrointestinal Nausea?: No Vomiting?: No Indigestion/heartburn?: No Diarrhea?: No Constipation?: No  Constitutional Fever: No Night sweats?: No Weight loss?: No Fatigue?: No  Skin Skin rash/lesions?: No Itching?:  No  Eyes Blurred vision?: No Double vision?: No  Ears/Nose/Throat Sore throat?: No Sinus problems?: No  Hematologic/Lymphatic Swollen glands?: No Easy bruising?: No  Cardiovascular Leg swelling?: No Chest pain?: No  Respiratory Cough?: No Shortness of breath?: No  Endocrine Excessive thirst?: No  Musculoskeletal Back pain?: No Joint pain?: No  Neurological Headaches?: No Dizziness?: No  Psychologic Depression?: No Anxiety?: No  Physical Exam: BP 134/73 (BP Location: Left Arm, Patient Position: Sitting, Cuff Size: Large)   Pulse 97   Ht 6' (1.829 m)   Wt 212 lb 12.8 oz (96.5 kg)   BMI 28.86 kg/m   Constitutional:  Alert, No acute distress. HEENT: [place] AT, moist mucus membranes.  Trachea midline, no masses. Cardiovascular: No clubbing, cyanosis, or edema. Respiratory: Normal respiratory effort, no increased work of breathing. GI: Abdomen is soft, nontender, nondistended, no abdominal masses GU: No CVA tenderness.  Penis circumcised without lesions, testes descended bilaterally without masses or tenderness, no paratesticular abnormalities.  Prostate 30 g smooth without nodules. Lymph: No cervical or inguinal lymphadenopathy. Skin: No rashes, bruises or suspicious lesions. Neurologic: Grossly intact,moving all 4 extremities. Psychiatric: Normal mood and affect.   Assessment & Plan:   57 year old male with both obstructive and storage related voiding symptoms which are moderate to severe.Marland Kitchen  Potential etiologies were discussed including BPH and neurogenic overactivity.  PVR by bladder scan was 51mL.  He was interested in a trial of medical management and Rx tamsulosin was sent to his pharmacy.  Follow-up in approximately 1 month with our PA for symptom reassessment.  He failed PDE 5 medications for his ED.  Other options were discussed including vacuum erection devices and intracavernosal injections.  He was provided literature on each.  He was not interested in  injections.    Abbie Sons, [place] [address] [place],  [address] [phone]

## 2018-11-19 ENCOUNTER — Ambulatory Visit: Payer: Medicaid Other | Admitting: Urology

## 2018-12-23 NOTE — Progress Notes (Deleted)
12/24/2018 8:56 PM   [name] [birth year] [id number]  Referring provider: No referring provider defined for this encounter.  No chief complaint on file.   HPI: Patient is a 58 year old Caucasian male with BPH with LU TS and ED who presents today after a trial of tamsulosin.  BPH WITH LUTS  (prostate and/or bladder) IPSS score: *** PVR: ***   Previous score: 19/5   Previous PVR: 26 mL  Major complaint(s):  x *** years. Denies any dysuria, hematuria or suprapubic pain.   Currently taking: ***.  His has had ***.   Denies any recent fevers, chills, nausea or vomiting.  He has a family history of PCa, with ***.   He does not have a family history of PCa.***    Score:  1-7 Mild 8-19 Moderate 20-35 Severe  Erectile dysfunction His SHIM score is ***, which is ***.   His previous SHIM score was ***.  He has been having difficulty with erections for ***.   His major complaint is ***.  His libido is ***.   His risk factors for ED are age, BPH, stroke, HTN, HLD, sleep apnea, CAD and smoking.  He denies any painful erections or curvatures with his erections.   He is still having/no longer having spontaneous erections.  He has tried *** in the past.       Score: 1-7 Severe ED 8-11 Moderate ED 12-16 Mild-Moderate ED 17-21 Mild ED 22-25 No ED    58 year old male referred for evaluation of lower urinary tract symptoms and erectile dysfunction.  He complains of urinary hesitancy, decreased force and caliber urinary stream and urgency since he had a CVA approximately 6-7 years ago.  From record review it looks like his only treatment has been prazosin 1 mg daily.  He is not sure if he is still taking this medication.  He denies dysuria or gross hematuria.  Denies flank, abdominal, pelvic or scrotal pain.  IPSS completed today was 19/35 with his most bothersome symptoms being weak stream, straining, urgency.  He also complains of difficulty achieving and maintaining an erection.   Organic risk factors include tobacco use, peripheral vascular disease, coronary artery disease, beta-blocker medication and hypertension.  He has tried sildenafil up to 100 mg which has not been effective.   PMH: Past Medical History:  Diagnosis Date  . CVA (cerebral infarction)   . Hypertension   . MI (myocardial infarction) [facility])     Surgical History: Past Surgical History:  Procedure Laterality Date  . CHOLECYSTECTOMY N/A 07/26/2016   Procedure: LAPAROSCOPIC CHOLECYSTECTOMY;  Surgeon: Florene Glen, MD;  Location: [facility] ORS;  Service: General;  Laterality: N/A;  . CORONARY ARTERY BYPASS GRAFT      Home Medications:  Allergies as of 12/24/2018   No Known Allergies     Medication List       Accurate as of December 23, 2018  8:56 PM. Always use your most recent med list.        aspirin 81 MG EC tablet Take 1 tablet (81 mg total) by mouth daily.   atorvastatin 40 MG tablet Commonly known as:  LIPITOR Take 1 tablet (40 mg total) by mouth daily at 6 PM.   buPROPion 150 MG 24 hr tablet Commonly known as:  WELLBUTRIN XL Take by mouth.   cephALEXin 500 MG capsule Commonly known as:  KEFLEX Take 1 capsule (500 mg total) by mouth 4 (four) times daily.   diclofenac sodium 1 % Gel Commonly  known as:  VOLTAREN Apply topically 4 (four) times daily.   donepezil 10 MG tablet Commonly known as:  ARICEPT Take by mouth.   hydrochlorothiazide 12.5 MG capsule Commonly known as:  MICROZIDE HYDROCHLOROTHIAZIDE 12.5 MG CAPS   HYDROcodone-acetaminophen 5-325 MG tablet Commonly known as:  NORCO/VICODIN Take 1-2 tablets by mouth every 4 (four) hours as needed for moderate pain.   lisinopril 20 MG tablet Commonly known as:  PRINIVIL,ZESTRIL Take by mouth.   metoprolol tartrate 25 MG tablet Commonly known as:  LOPRESSOR Take 0.5 tablets (12.5 mg total) by mouth 2 (two) times daily.   PROVENTIL HFA 108 (90 Base) MCG/ACT inhaler Generic drug:  albuterol Inhale into the lungs.    tamsulosin 0.4 MG Caps capsule Commonly known as:  FLOMAX Take 1 capsule (0.4 mg total) by mouth daily.   traZODone 50 MG tablet Commonly known as:  DESYREL Take by mouth.       Allergies: No Known Allergies  Family History: No family history on file.  Social History:  reports that he has been smoking cigarettes. He has a 86.00 pack-year smoking history. He has never used smokeless tobacco. He reports that he does not drink alcohol or use drugs.  ROS:                                        Physical Exam: There were no vitals taken for this visit.  Constitutional:  Well nourished. Alert and oriented, No acute distress. HEENT: [place] AT, moist mucus membranes.  Trachea midline, no masses. Cardiovascular: No clubbing, cyanosis, or edema. Respiratory: Normal respiratory effort, no increased work of breathing. GI: Abdomen is soft, non tender, non distended, no abdominal masses. Liver and spleen not palpable.  No hernias appreciated.  Stool sample for occult testing is not indicated.   GU: No CVA tenderness.  No bladder fullness or masses.  Patient with circumcised/uncircumcised phallus. ***Foreskin easily retracted***  Urethral meatus is patent.  No penile discharge. No penile lesions or rashes. Scrotum without lesions, cysts, rashes and/or edema.  Testicles are located scrotally bilaterally. No masses are appreciated in the testicles. Left and right epididymis are normal. Rectal: Patient with  normal sphincter tone. Anus and perineum without scarring or rashes. No rectal masses are appreciated. Prostate is approximately *** grams, *** nodules are appreciated. Seminal vesicles are normal. Skin: No rashes, bruises or suspicious lesions. Lymph: No cervical or inguinal adenopathy. Neurologic: Grossly intact, no focal deficits, moving all 4 extremities. Psychiatric: Normal mood and affect.   Assessment & Plan:    1. BPH with LUTS IPSS score is ***, it is  stable/improving/worsening Continue conservative management, avoiding bladder irritants and timed voiding's Most bothersome symptoms is/are *** Initiate alpha-blocker (***), discussed side effects *** Initiate 5 alpha reductase inhibitor (***), discussed side effects *** Continue tamsulosin 0.4 mg daily, alfuzosin 10 mg daily, Rapaflo 8 mg daily, terazosin, doxazosin, Cialis 5 mg daily and finasteride 5 mg daily, dutasteride 0.5 mg daily***:refills given Cannot tolerate medication or medication failure, schedule cystoscopy *** RTC in *** months for IPSS, PSA, PVR and exam   2. Erectile dysfunction  - SHIM score is ***, it is stable, worsening, improving  - I explained to the patient that in order to achieve an erection it takes good functioning of the nervous system (parasympathetic and rs, sympathetic, sensory and motor), good blood flow into the erectile tissue of the penis and  a desire to have sex  - I explained that conditions like diabetes, hypertension, coronary artery disease, peripheral vascular disease, smoking, alcohol consumption, age, sleep apnea and BPH can diminish the ability to have an erection  - I explained the ED may be a risk marker for underlying CVD and he should follow up with PCP for further studies ***  - ED is an impairment in the arousal phase of the sexual response cycle.  Desire, orgasm and resolution are not considered ED and should be approached as a separate issue  - we will obtain a serum testosterone level at this time; if it is abnormal we will need to repeat the study for confirmation  - A recent study published in Sex Med 2018 Apr 13 revealed moderate to vigorous aerobic exercise for 40 minutes 4 times per week can decrease erectile problems caused by physical inactivity, obesity, hypertension, metabolic syndrome and/or cardiovascular diseases  - We discussed trying a *** different PDE5 inhibitor, intra-urethral suppositories, intracavernous vasoactive drug  injection therapy, vacuum constriction device and penile prosthesis implantation  - He would like to try ***  - Continue Cialis, Viagra, Levitra, Stendra  - RTC in *** months for repeat SHIM score and exam    58 year old male with both obstructive and storage related voiding symptoms which are moderate to severe.Marland Kitchen  Potential etiologies were discussed including BPH and neurogenic overactivity.  PVR by bladder scan was 76mL.  He was interested in a trial of medical management and Rx tamsulosin was sent to his pharmacy.  Follow-up in approximately 1 month with our PA for symptom reassessment.  He failed PDE 5 medications for his ED.  Other options were discussed including vacuum erection devices and intracavernosal injections.  He was provided literature on each.  He was not interested in injections. ***    Royden Purl  [place] [address] [place], [place] [address] [phone]

## 2018-12-24 ENCOUNTER — Encounter: Payer: Self-pay | Admitting: Urology

## 2018-12-24 ENCOUNTER — Ambulatory Visit: Payer: Medicaid Other | Admitting: Urology

## 2018-12-24 DIAGNOSIS — R413 Other amnesia: Secondary | ICD-10-CM | POA: Insufficient documentation

## 2018-12-27 ENCOUNTER — Other Ambulatory Visit: Payer: Self-pay | Admitting: Acute Care

## 2018-12-27 DIAGNOSIS — Z8673 Personal history of transient ischemic attack (TIA), and cerebral infarction without residual deficits: Secondary | ICD-10-CM

## 2019-01-07 ENCOUNTER — Ambulatory Visit
Admission: RE | Admit: 2019-01-07 | Discharge: 2019-01-07 | Disposition: A | Discharge status: Home / Self Care | Payer: Medicaid Other | Source: Ambulatory Visit | Attending: Acute Care | Admitting: Acute Care

## 2019-01-07 DIAGNOSIS — Z8673 Personal history of transient ischemic attack (TIA), and cerebral infarction without residual deficits: Secondary | ICD-10-CM

## 2019-01-14 ENCOUNTER — Other Ambulatory Visit: Payer: Self-pay

## 2019-01-14 MED ORDER — TAMSULOSIN HCL 0.4 MG PO CAPS: 0.4000 mg | ORAL_CAPSULE | Freq: Every day | ORAL | 0 refills | Status: DC

## 2019-01-14 NOTE — Telephone Encounter (Signed)
Received refill request. Patient has fu appt with Larene Beach in Feb. Tamsulosin sent to [facility]

## 2019-01-22 ENCOUNTER — Ambulatory Visit (INDEPENDENT_AMBULATORY_CARE_PROVIDER_SITE_OTHER): Payer: Medicare Other | Admitting: Urology

## 2019-01-22 ENCOUNTER — Encounter: Payer: Self-pay | Admitting: Urology

## 2019-01-22 VITALS — BP 144/85 | HR 90 | Ht 72.0 in | Wt 221.5 lb

## 2019-01-22 DIAGNOSIS — N400 Enlarged prostate without lower urinary tract symptoms: Secondary | ICD-10-CM | POA: Diagnosis not present

## 2019-01-22 LAB — BLADDER SCAN AMB NON-IMAGING: Scan Result: 9

## 2019-01-22 NOTE — Progress Notes (Signed)
01/22/2019  1:57 PM   [name] 06-26-1961 [id number]  Referring provider: No referring provider defined for this encounter.  Chief Complaint  Patient presents with  . Benign Prostatic Hypertrophy    HPI: [name] is a 58 yo M with a history of stroke returns today for the evaluation and management of obstructive and storage related voiding symptoms.   He discontinued use of Tamsulosin and finds it ineffective. He is not sure whether these urinary symptoms started after his stroke. He denies urinary hesitancy and describes his stream as being half strong. He reports not beng able to void well. He states that he does not hold his urine for too long. He reports of drinking 1 glass of water a day and has quit drinking [facility]. He drinks tea at least 1x a week and occasionally drinks apple juice and orange juice. He drinks 2-3 12oz beers daily. His I PSS today is 10/2. His PVR is 9 mL.  He reports of itching that started after his MRI WO contrast on 01/07/2019. He reports that his skin turned red throughout his body.   He also reports of difficulty with erections and is not interested in intracavernosal injections at this time.   Previous history: He had a CVA approximately 6-7 years ago which was when his urinary symptoms started. His only treatment since has been prazosin 1 mg daily. He is not sure if he is still taking this medication. IPSS on 10/18/2018 was 19/3.  Organic risk factors include tobacco use, peripheral vascular disease, coronary artery disease, beta-blocker medication and hypertension.  He has tried sildenafil up to 100 mg which has not been effective.  IPSS    Row Name 01/22/19 1300         International Prostate Symptom Score   How often have you had the sensation of not emptying your bladder?  Not at All     How often have you had to urinate less than every two hours?  About half the time     How often have you found you stopped and started again several  times when you urinated?  Not at All     How often have you found it difficult to postpone urination?  Less than half the time     How often have you had a weak urinary stream?  Less than half the time     How often have you had to strain to start urination?  Not at All     How many times did you typically get up at night to urinate?  3 Times     Total IPSS Score  10       Quality of Life due to urinary symptoms   If you were to spend the rest of your life with your urinary condition just the way it is now how would you feel about that?  Mostly Satisfied        Score:  1-7 Mild 8-19 Moderate 20-35 Severe   PMH: Past Medical History:  Diagnosis Date  . CVA (cerebral infarction)   . Hypertension   . MI (myocardial infarction) [facility])     Surgical History: Past Surgical History:  Procedure Laterality Date  . CHOLECYSTECTOMY N/A 07/26/2016   Procedure: LAPAROSCOPIC CHOLECYSTECTOMY;  Surgeon: Florene Glen, MD;  Location: [facility] ORS;  Service: General;  Laterality: N/A;  . CORONARY ARTERY BYPASS GRAFT      Home Medications:  Allergies as of 01/22/2019   No Known  Allergies     Medication List       Accurate as of January 22, 2019  1:57 PM. Always use your most recent med list.        aspirin 81 MG EC tablet Take 1 tablet (81 mg total) by mouth daily.   atorvastatin 40 MG tablet Commonly known as:  LIPITOR Take 1 tablet (40 mg total) by mouth daily at 6 PM.   buPROPion 150 MG 24 hr tablet Commonly known as:  WELLBUTRIN XL Take by mouth.   diclofenac sodium 1 % Gel Commonly known as:  VOLTAREN Apply topically 4 (four) times daily.   donepezil 10 MG tablet Commonly known as:  ARICEPT Take by mouth.   hydrochlorothiazide 12.5 MG capsule Commonly known as:  MICROZIDE HYDROCHLOROTHIAZIDE 12.5 MG CAPS   lisinopril 20 MG tablet Commonly known as:  PRINIVIL,ZESTRIL Take by mouth.   metoprolol tartrate 25 MG tablet Commonly known as:  LOPRESSOR Take 0.5 tablets  (12.5 mg total) by mouth 2 (two) times daily.   PROVENTIL HFA 108 (90 Base) MCG/ACT inhaler Generic drug:  albuterol Inhale into the lungs.   tamsulosin 0.4 MG Caps capsule Commonly known as:  FLOMAX Take 1 capsule (0.4 mg total) by mouth daily.   traZODone 50 MG tablet Commonly known as:  DESYREL Take by mouth.       Allergies: No Known Allergies  Family History: History reviewed. No pertinent family history.  Social History:  reports that he has been smoking cigarettes. He has a 86.00 pack-year smoking history. He has never used smokeless tobacco. He reports that he does not drink alcohol or use drugs.  ROS: UROLOGY Frequent Urination?: No Hard to postpone urination?: No Burning/pain with urination?: No Get up at night to urinate?: No Leakage of urine?: No Urine stream starts and stops?: No Trouble starting stream?: No Do you have to strain to urinate?: No Blood in urine?: No Urinary tract infection?: No Sexually transmitted disease?: No Injury to kidneys or bladder?: No Painful intercourse?: No Weak stream?: No Erection problems?: No Penile pain?: No  Gastrointestinal Nausea?: No Vomiting?: No Indigestion/heartburn?: No Diarrhea?: No Constipation?: No  Constitutional Fever: No Night sweats?: No Weight loss?: No Fatigue?: No  Skin Skin rash/lesions?: No Itching?: Yes  Eyes Blurred vision?: Yes Double vision?: No  Ears/Nose/Throat Sore throat?: No Sinus problems?: Yes  Hematologic/Lymphatic Swollen glands?: No Easy bruising?: No  Cardiovascular Leg swelling?: Yes Chest pain?: No  Respiratory Cough?: No Shortness of breath?: Yes  Endocrine Excessive thirst?: No  Musculoskeletal Back pain?: No Joint pain?: Yes  Neurological Headaches?: No Dizziness?: No  Psychologic Depression?: No Anxiety?: No  Physical Exam: BP (!) 144/85 (BP Location: Left Arm, Patient Position: Sitting, Cuff Size: Normal)   Pulse 90   Ht 6' (1.829 m)    Wt 221 lb 8 oz (100.5 kg)   BMI 30.04 kg/m   Constitutional:  Alert and oriented, No acute distress. HEENT: [place] AT, moist mucus membranes.  Trachea midline, no masses. Cardiovascular: No clubbing, cyanosis, or edema. Respiratory: Normal respiratory effort, no increased work of breathing. Skin: No rashes, bruises or suspicious lesions. Neurologic: Grossly intact, no focal deficits, moving all 4 extremities. Psychiatric: Normal mood and affect.  Pertinent Imaging: Results for orders placed or performed in visit on 01/22/19  BLADDER SCAN AMB NON-IMAGING  Result Value Ref Range   Scan Result 9     Assessment & Plan:    1. Obstructive and storage related voiding symptoms -Discontinued Tamsulosin since it  was ineffective -Mediation not an option due to his cardiac disease and memory loss  -PVR 9 mL, PVR suggests that he is voiding well  -Discussed treatment options of [name] vs. PT therapy along with the complication and benefits of each  -Pt interested in [name]; need to see if insurance covers [name] and if so, [name] is to be scheduled   2. Erectile dysfunction -He also complains of difficulty achieving and maintaining an erection. -Organic risk factors include tobacco use, peripheral vascular disease, coronary artery disease, beta-blocker medication and hypertension.  He has tried sildenafil up to 100 mg which has not been effective. -Pt not interested in intracavernosal injections   Return for need to check with insurance for coverage of [name] and then schdule for [name] .  Zara Council, PA-C  [facility] [address], [place], [place] [address] [phone]  I, Lucas Mallow, am acting as a Education administrator for [employer],  I have reviewed the above documentation for accuracy and completeness, and I agree with the above.    Zara Council, PA-C

## 2019-02-04 ENCOUNTER — Telehealth: Payer: Self-pay | Admitting: Urology

## 2019-02-04 NOTE — Telephone Encounter (Signed)
[name] has been scheduled  No PA required Patient is aware  [name]

## 2019-02-10 ENCOUNTER — Ambulatory Visit: Payer: Medicare Other | Admitting: Family Medicine

## 2019-02-10 DIAGNOSIS — R3915 Urgency of urination: Secondary | ICD-10-CM

## 2019-02-10 DIAGNOSIS — N400 Enlarged prostate without lower urinary tract symptoms: Secondary | ICD-10-CM

## 2019-02-10 NOTE — Progress Notes (Signed)
[name]  Session # 1  Health & Social Factors: no change Caffeine: 2 Alcohol: 3 Daytime voids #per day: 6 Night-time voids #per night: 3 Urgency: strong Incontinence Episodes #per day: 0 Ankle used: right Treatment Setting: 8 Feeling/ Response: sensory Comments: Patient tolerated well  Preformed By: Elberta Leatherwood, CMA  Follow Up: 1 week

## 2019-02-12 ENCOUNTER — Other Ambulatory Visit: Payer: Self-pay

## 2019-02-12 NOTE — Telephone Encounter (Signed)
Incoming request from pharmacy for Flomax denied per providers note that states medication was ineffective for patient, rx was denied.

## 2019-02-17 ENCOUNTER — Ambulatory Visit (INDEPENDENT_AMBULATORY_CARE_PROVIDER_SITE_OTHER): Payer: Medicare Other | Admitting: Urology

## 2019-02-17 DIAGNOSIS — R3915 Urgency of urination: Secondary | ICD-10-CM | POA: Diagnosis not present

## 2019-02-17 NOTE — Progress Notes (Signed)
[name]  Session # 2  Health & Social Factors: no change Caffeine: 2 Alcohol: 3 Daytime voids #per day: 5 Night-time voids #per night: 1 Urgency: strong Incontinence Episodes #per day: 1 Ankle used: right Treatment Setting: 1 Feeling/ Response: both Comments: patient tolerated well  Preformed By: Zara Council, PA-C  Follow Up: 1 week

## 2019-02-18 ENCOUNTER — Other Ambulatory Visit: Payer: Self-pay

## 2019-02-18 ENCOUNTER — Ambulatory Visit: Payer: Medicare Other | Attending: Registered Nurse

## 2019-02-18 DIAGNOSIS — M7989 Other specified soft tissue disorders: Secondary | ICD-10-CM | POA: Diagnosis present

## 2019-02-18 DIAGNOSIS — G8929 Other chronic pain: Secondary | ICD-10-CM

## 2019-02-18 DIAGNOSIS — M25511 Pain in right shoulder: Secondary | ICD-10-CM | POA: Insufficient documentation

## 2019-02-18 DIAGNOSIS — M6281 Muscle weakness (generalized): Secondary | ICD-10-CM | POA: Diagnosis present

## 2019-02-18 NOTE — Therapy (Signed)
[facility] [address], [place], [address] Phone: [phone]   Fax:  [phone]  Physical Therapy Evaluation  Patient Details  Name: [name] MRN: [id number] Date of Birth: Aug 21, 1961 Referring Provider (PT): Alwyn Pea, NP   Encounter Date: 02/18/2019  PT End of Session - 02/18/19 1622    Visit Number  1    Number of Visits  4   eval + 3 Medicaid visits   Date for PT Re-Evaluation  03/20/19    PT Start Time  1622    PT Stop Time  1703    PT Time Calculation (min)  41 min    Activity Tolerance  Patient tolerated treatment well    Behavior During Therapy  [facility] for tasks assessed/performed       Past Medical History:  Diagnosis Date  . CVA (cerebral infarction)   . Hypertension   . MI (myocardial infarction) [facility])     Past Surgical History:  Procedure Laterality Date  . CHOLECYSTECTOMY N/A 07/26/2016   Procedure: LAPAROSCOPIC CHOLECYSTECTOMY;  Surgeon: Florene Glen, MD;  Location: [facility] ORS;  Service: General;  Laterality: N/A;  . CORONARY ARTERY BYPASS GRAFT      There were no vitals filed for this visit.   Subjective Assessment - 02/18/19 1630    Subjective  R shoulder pain: 0/10 currently (pt is at resting position), 4/10 R shoulder pain at most for the psat 3 months.     Pertinent History  R shoulder pain (anterior). Pt is R hand dominant. Pt fell off a 4 ft porch about 4 years ago and pt landed onto his R shoulder. Did not bother him for the first 6 months. R shoulder however started bothering him afterwards and had a hard time moving it.  Has not had PT for his R shoulder before.   Pt states that his R shoulder bothers him at night when he is sleeping.   Able to sleep 2-3 hours prior to his shoulder waking him up, pt usually on his R side.     Patient Stated Goals  Sleep better.     Currently in Pain?  No/denies    Pain Score  0-No pain    Pain Location  Shoulder    Pain  Orientation  Right    Pain Type  Chronic pain    Pain Onset  More than a month ago    Pain Frequency  Occasional    Aggravating Factors   raising his R arm, reaching to the side, and behind his neck, reaching behind his back.     Pain Relieving Factors  resting position.          [facility] PT Assessment - 02/18/19 1638      Assessment   Medical Diagnosis  Shoulder joint pain, R    Referring Provider (PT)  Alwyn Pea, NP    Onset Date/Surgical Date  01/24/19   Date PT referral signed. Chronic condition   Hand Dominance  Right    Prior Therapy  No known PT for current condition      Precautions   Precaution Comments  no known precautions      Restrictions   Other Position/Activity Restrictions  no known restrictions      Prior Function   Vocation  On disability    Vocation Requirements  PLOF: better able to reach, raise his R arm, drive, sleep with less pain      Observation/Other  Assessments   Observations  (+) empty can. Michel Bickers and Y[place]um causes posterior R shoulder stretch.  Increased pain with scapular retraction with arm flexion      Posture/Postural Control   Posture Comments  B protracted shoulders, neck, R shoulder higher, slight R lateral shift, decreased lordosis, backwards lean      AROM   Right Shoulder Flexion  111 Degrees   120 AAROM with pain   Right Shoulder ABduction  101 Degrees   110 AAROM with pain   Cervical Extension  WFL     Cervical - Right Side Bend  WFL    Cervical - Left Side Bend  WFL    Cervical - Right Rotation  WFL    Cervical - Left Rotation  [facility]      Strength   Right Shoulder Flexion  4/5   scapular winging   Right Shoulder ABduction  4/5   scapular winging   Right Shoulder Internal Rotation  5/5    Right Shoulder External Rotation  4/5                Objective measurements completed on examination: See above findings.      Had an x-ray for his R shoulder which did not reveal any fractures. Had a cortisone  shot which did not work.     No known latex allergies.     Blood pressure L arm sitting, mechanically taken, normal cuff: 138/83, HR 103    Therapeutic exercise  Seated manually resisted R scapular retraction targeting the lower trap muscle   10x5 seconds x 2   120 degrees R shoulder flexion AROM without pain afterwards.   Isometric R shoulder ER at wall 10x5 seconds   R suprascapular area burning which eases with rest.   No R shoulder pain with flexion  Improved exercise technique, movement at target joints, use of target muscles after mod verbal, visual, tactile cues.   Patient is a 58 year old male who came to physical therapy secondary to R shoulder pain. He also presents with poor posture, scapular and rotator cuff weakness, positive special test suggesting rotator cuff involvement as well as possible impingement, reproduction of symptoms with raising his arm as well as with reaching, and difficulty performing functional tasks and sleeping due to R shoulder pain. Pt will benefit from skilled physical therapy services to address the aforementioned deficits.         PT Education - 02/18/19 1743    Education Details  ther-ex, plan of care    Person(s) Educated  Patient    Methods  Explanation;Demonstration;Tactile cues;Verbal cues    Comprehension  Returned demonstration;Verbalized understanding       PT Short Term Goals - 02/18/19 1729      PT SHORT TERM GOAL #1   Title  Patient will be independent with his HEP to decrease pain and improve ability to raise his arm and reach more comfortably.     Baseline  Pt has not yet started his HEP (02/18/2019)    Time  3    Period  Weeks    Status  New    Target Date  03/11/19        PT Long Term Goals - 02/18/19 1731      PT LONG TERM GOAL #1   Title  Patient will have a decrease in R shoulder pain to 2/10 or less at worst to promote ability to reach, drive, and sleep more comfortably.  Baseline  4/10 R shoulder pain at  worst for the past 3 months (02/18/2019)    Time  4    Period  Weeks    Status  New    Target Date  03/20/19      PT LONG TERM GOAL #2   Title  Patient will improve R shoulder strength by at least 1/2 MMT grade to promote ability to reach with less shoulder pain.     Baseline  R shoulder flexion 4/5, abduction 4/5, IR 5/5, ER 4/5 (02/18/2019)    Time  4    Period  Weeks    Status  New    Target Date  03/20/19      PT LONG TERM GOAL #3   Title  Patient will improve R shoulder flexion and abduction AROM by at least 25 degrees to promote ability to raise his R arm.     Baseline  R shoulder flexion 111 degrees, abduction 101 degrees (02/18/2019)    Time  4    Period  Weeks    Status  New    Target Date  03/20/19             Plan - 02/18/19 1715    Clinical Impression Statement  Patient is a 58 year old male who came to physical therapy secondary to R shoulder pain. He also presents with poor posture, scapular and rotator cuff weakness, positive special test suggesting rotator cuff involvement as well as possible impingement, reproduction of symptoms with raising his arm as well as with reaching, and difficulty performing functional tasks and sleeping due to R shoulder pain. Pt will benefit from skilled physical therapy services to address the aforementioned deficits.     Personal Factors and Comorbidities  Comorbidity 2;Time since onset of injury/illness/exacerbation;Education   needs assist on instructions   Comorbidities  cognitive impairment, history of CVA    Examination-Activity Limitations  Other;Sleep;Reach Overhead   driving   Examination-Participation Restrictions  Driving    Stability/Clinical Decision Making  Evolving/Moderate complexity   Pain seems to be worse since onset    Clinical Decision Making  Moderate    Rehab Potential  Fair    PT Frequency  1x / week    PT Duration  4 weeks    PT Treatment/Interventions  Therapeutic exercise;Therapeutic  activities;Neuromuscular re-education;Patient/family education;Manual techniques;Dry needling;Passive range of motion;Spinal Manipulations;Joint Manipulations;Aquatic Therapy;Biofeedback;Cryotherapy;Electrical Stimulation;Iontophoresis 4mg /ml Dexamethasone;Traction;Ultrasound    PT Next Visit Plan  scapular, ER muscle strengthening, thoracic extension, manual techniques, modalities PRN    Consulted and Agree with Plan of Care  Patient       Patient will benefit from skilled therapeutic intervention in order to improve the following deficits and impairments:  Pain, Postural dysfunction, Improper body mechanics, Decreased strength, Decreased range of motion  Visit Diagnosis: Chronic right shoulder pain - Plan: PT plan of care cert/re-cert  Muscle weakness (generalized) - Plan: PT plan of care cert/re-cert  Other specified soft tissue disorders - Plan: PT plan of care cert/re-cert     Problem List Patient Active Problem List   Diagnosis Date Noted  . Memory loss of unknown cause 10/14/2018  . Sleeping difficulty 10/14/2018  . Mild aortic valve stenosis 10/08/2018  . Hyperlipidemia 03/27/2018  . Cognitive impairment 03/12/2018  . History of stroke 03/12/2018  . Hypertension 03/01/2018  . Atherosclerosis of native arteries of extremity with intermittent claudication ([place]) 03/01/2018  . Atherosclerotic peripheral vascular disease with intermittent claudication ([place]) 01/03/2018  . Benign essential HTN 12/07/2017  .  Bilateral carotid artery stenosis 12/07/2017  . SOBOE (shortness of breath on exertion) 12/07/2017  . Acute cholecystitis 07/24/2016  . Coronary artery disease involving coronary bypass graft of native heart without angina pectoris   . Tobacco abuse    Joneen Boers PT, DPT   02/18/2019, 5:50 PM  [facility] [address] [place], [address] Phone: [phone]   Fax:  [phone]  Name: [name] MRN: [id number] Date of Birth: [birth year]

## 2019-02-24 ENCOUNTER — Ambulatory Visit (INDEPENDENT_AMBULATORY_CARE_PROVIDER_SITE_OTHER): Payer: Medicare Other | Admitting: Family Medicine

## 2019-02-24 DIAGNOSIS — R3915 Urgency of urination: Secondary | ICD-10-CM | POA: Diagnosis not present

## 2019-02-24 NOTE — Progress Notes (Signed)
[name]  Session # 3  Health & Social Factors: no change Caffeine: 2 Alcohol: 3 Daytime voids #per day: 5 Night-time voids #per night: 2 Urgency: Mild Incontinence Episodes #per day: 0 Ankle used: right Treatment Setting: 2 Feeling/ Response: both Comments: Patient tolerated well  Preformed By: Elberta Leatherwood, CMA  Follow Up: 1 week

## 2019-02-25 ENCOUNTER — Ambulatory Visit: Payer: Medicare Other

## 2019-02-25 DIAGNOSIS — M6281 Muscle weakness (generalized): Secondary | ICD-10-CM

## 2019-02-25 DIAGNOSIS — M25511 Pain in right shoulder: Principal | ICD-10-CM

## 2019-02-25 DIAGNOSIS — G8929 Other chronic pain: Secondary | ICD-10-CM

## 2019-02-25 DIAGNOSIS — M7989 Other specified soft tissue disorders: Secondary | ICD-10-CM

## 2019-02-25 NOTE — Patient Instructions (Addendum)
Medbridge Access Code: [email or web address]    Seated Shoulder Inferior Glide  10x3 with 5 second holds   Isometric Shoulder Horizontal Abduction at Wall  10x3 with 5 second holds    Standing R shoulder IR green band 10x3

## 2019-02-25 NOTE — Therapy (Signed)
[facility] [address] [place], [address] Phone: [phone]   Fax:  [phone]  Physical Therapy Treatment  Patient Details  Name: [name] MRN: [id number] Date of Birth: 24-Mar-1961 Referring Provider (PT): Alwyn Pea, NP   Encounter Date: 02/25/2019  PT End of Session - 02/25/19 1703    Visit Number  2    Number of Visits  4   eval + 3 Medicaid visits   Date for PT Re-Evaluation  03/20/19    PT Start Time  1703    PT Stop Time  1884    PT Time Calculation (min)  44 min    Activity Tolerance  Patient tolerated treatment well    Behavior During Therapy  [facility] for tasks assessed/performed       Past Medical History:  Diagnosis Date  . CVA (cerebral infarction)   . Hypertension   . MI (myocardial infarction) [facility])     Past Surgical History:  Procedure Laterality Date  . CHOLECYSTECTOMY N/A 07/26/2016   Procedure: LAPAROSCOPIC CHOLECYSTECTOMY;  Surgeon: Florene Glen, MD;  Location: [facility] ORS;  Service: General;  Laterality: N/A;  . CORONARY ARTERY BYPASS GRAFT      There were no vitals filed for this visit.  Subjective Assessment - 02/25/19 1704    Subjective  R shoulder is feeling pretty good. Has not woken him up yet so far. No pain currently. Reaching across and raising his R arm up bothers the front of his shoulder.     Pertinent History  R shoulder pain (anterior). Pt is R hand dominant. Pt fell off a 4 ft porch about 4 years ago and pt landed onto his R shoulder. Did not bother him for the first 6 months. R shoulder however started bothering him afterwards and had a hard time moving it.  Has not had PT for his R shoulder before.   Pt states that his R shoulder bothers him at night when he is sleeping.   Able to sleep 2-3 hours prior to his shoulder waking him up, pt usually on his R side.     Patient Stated Goals  Sleep better.     Currently in Pain?  No/denies    Pain Score  0-No  pain    Pain Onset  More than a month ago                                 PT Education - 02/25/19 1707    Education Details  ther-ex, HEP    Person(s) Educated  Patient    Methods  Explanation;Demonstration;Tactile cues;Verbal cues;Handout    Comprehension  Returned demonstration;Verbalized understanding       Objectives  Medbridge Access Code: [email or web address]   Therapeutic exercise  Seated self inferior glide R shoulder 10x5 seconds for 2 sets  Decreased R shoulder pain with flexion   R shoulder horizontal abduction yellow band 3x. R anterior shoulder discomfort. Decreased shoulder pain with horizontal adduction  R shoulder ER yellow band 3x. R shoulder pain  R shoulder horizontal abduction at door frame 10x5 seconds   Scapular retraction green band 10x5 seconds for 2 sets  R doorway pectoralis stretch 30 seconds x 3  Standing B shoulder extension green band 10x3  Standing R shoulder IR green band 10x3  Reviewed HEP. Pt demonstrated and verbalized understanding. Handout provided.    Improved exercise technique, movement  at target joints, use of target muscles after mod verbal, visual, tactile cues.   Response to treatment  Decreased pain with shoulder flexion and reaching across his body after exercises.   Clinical impression Worked on improving scapular and shoulder strength, decreasing pectoralis muscle tension to promote scapular retraction when raising his arm up, as well as promote joint mobility to promote better intraarticular movement. Decreased R shoulder pain with flexion and horizontal adduction after session. Pt will benefit from continued skilled physical therapy services to decrease pain, improve strength and function.            PT Short Term Goals - 02/18/19 1729      PT SHORT TERM GOAL #1   Title  Patient will be independent with his HEP to decrease pain and improve ability to raise his arm and reach more comfortably.      Baseline  Pt has not yet started his HEP (02/18/2019)    Time  3    Period  Weeks    Status  New    Target Date  03/11/19        PT Long Term Goals - 02/18/19 1731      PT LONG TERM GOAL #1   Title  Patient will have a decrease in R shoulder pain to 2/10 or less at worst to promote ability to reach, drive, and sleep more comfortably.     Baseline  4/10 R shoulder pain at worst for the past 3 months (02/18/2019)    Time  4    Period  Weeks    Status  New    Target Date  03/20/19      PT LONG TERM GOAL #2   Title  Patient will improve R shoulder strength by at least 1/2 MMT grade to promote ability to reach with less shoulder pain.     Baseline  R shoulder flexion 4/5, abduction 4/5, IR 5/5, ER 4/5 (02/18/2019)    Time  4    Period  Weeks    Status  New    Target Date  03/20/19      PT LONG TERM GOAL #3   Title  Patient will improve R shoulder flexion and abduction AROM by at least 25 degrees to promote ability to raise his R arm.     Baseline  R shoulder flexion 111 degrees, abduction 101 degrees (02/18/2019)    Time  4    Period  Weeks    Status  New    Target Date  03/20/19            Plan - 02/25/19 1707    Clinical Impression Statement  Worked on improving scapular and shoulder strength, decreasing pectoralis muscle tension to promote scapular retraction when raising his arm up, as well as promote joint mobility to promote better intraarticular movement. Decreased R shoulder pain with flexion and horizontal adduction after session. Pt will benefit from continued skilled physical therapy services to decrease pain, improve strength and function.     Personal Factors and Comorbidities  Comorbidity 2;Time since onset of injury/illness/exacerbation;Education   needs assist on instructions   Comorbidities  cognitive impairment, history of CVA    Examination-Activity Limitations  Other;Sleep;Reach Overhead   driving   Examination-Participation Restrictions  Driving     Stability/Clinical Decision Making  Evolving/Moderate complexity   Pain seems to be worse since onset    Rehab Potential  Fair    PT Frequency  1x / week  PT Duration  4 weeks    PT Treatment/Interventions  Therapeutic exercise;Therapeutic activities;Neuromuscular re-education;Patient/family education;Manual techniques;Dry needling;Passive range of motion;Spinal Manipulations;Joint Manipulations;Aquatic Therapy;Biofeedback;Cryotherapy;Electrical Stimulation;Iontophoresis 4mg /ml Dexamethasone;Traction;Ultrasound    PT Next Visit Plan  scapular, ER muscle strengthening, thoracic extension, manual techniques, modalities PRN    Consulted and Agree with Plan of Care  Patient       Patient will benefit from skilled therapeutic intervention in order to improve the following deficits and impairments:  Pain, Postural dysfunction, Improper body mechanics, Decreased strength, Decreased range of motion  Visit Diagnosis: Chronic right shoulder pain  Muscle weakness (generalized)  Other specified soft tissue disorders     Problem List Patient Active Problem List   Diagnosis Date Noted  . Memory loss of unknown cause 10/14/2018  . Sleeping difficulty 10/14/2018  . Mild aortic valve stenosis 10/08/2018  . Hyperlipidemia 03/27/2018  . Cognitive impairment 03/12/2018  . History of stroke 03/12/2018  . Hypertension 03/01/2018  . Atherosclerosis of native arteries of extremity with intermittent claudication ([place]) 03/01/2018  . Atherosclerotic peripheral vascular disease with intermittent claudication ([place]) 01/03/2018  . Benign essential HTN 12/07/2017  . Bilateral carotid artery stenosis 12/07/2017  . SOBOE (shortness of breath on exertion) 12/07/2017  . Acute cholecystitis 07/24/2016  . Coronary artery disease involving coronary bypass graft of native heart without angina pectoris   . Tobacco abuse     Joneen Boers PT, DPT   02/25/2019, 6:00 PM  [facility] [address] [place], [address] Phone: [phone]   Fax:  [phone]  Name: Athan Casalino MRN: [id number] Date of Birth: 1961/02/19

## 2019-03-03 ENCOUNTER — Ambulatory Visit: Payer: Medicare Other

## 2019-03-04 ENCOUNTER — Ambulatory Visit: Payer: Medicare Other

## 2019-03-10 ENCOUNTER — Ambulatory Visit: Payer: Medicare Other

## 2019-03-10 NOTE — Therapy (Signed)
[facility] [address], [place], [address] Phone: [phone]   Fax:  [phone]  Patient Details  Name: [name] MRN: [id number] Date of Birth: [birth year] Referring Provider:  No ref. provider found  Encounter Date: 03/10/2019      Called patient and informed patient about current clinic closure for a minimum of 2 weeks due to the corona virus outbreak. [name] informed pt that when the clinic opens back up again, someone shoulder be able to call him and schedule more PT appointments if needed. Pt states no questions with his home exercises. Not sure if he is interested in telehealth or an online PT program because he does not have a computer and also prefers a person to person interaction.   Pt also states this his R shoulder is a little better. Still bothers him. Was not able to go to last PT session due to being sick and with a fever. Back also started itching bad. Went to [place] and was unable to see a doctor until 03/21/2019. Was told to get allergy medicine. When his ears are not stocked up, its more normal to breath. Its harder to breath when his ears are stocked up. Gets shortness of breath when he walks to his truck since 4-5 months ago. Went to [facility]. Was told that MD could not see him until 03/21/2019. When he coughs, its like he is trying to get phlegm out of his throat. Eyes watery. Pt was recommended that if his symptoms get worse, to get seen. Pt verbalized understanding.      Joneen Boers PT, DPT   03/10/2019, 1:56 PM  [facility] [address] [place], [address] Phone: [phone]   Fax:  [phone]

## 2019-03-11 ENCOUNTER — Ambulatory Visit: Payer: Medicare Other

## 2019-03-17 ENCOUNTER — Ambulatory Visit: Payer: Medicare Other

## 2019-03-24 ENCOUNTER — Ambulatory Visit: Payer: Medicare Other

## 2019-03-31 ENCOUNTER — Ambulatory Visit: Payer: Medicare Other

## 2019-04-02 NOTE — Therapy (Signed)
[facility] [address], [place], [address] Phone: [phone]   Fax:  [phone]  Patient Details  Name: [name] MRN: [id number] Date of Birth: 04-11-1961 Referring Provider:  No ref. provider found  Encounter Date: 04/02/2019   The [facility] outpatient clinics are closed at this time due to the COVID-19 epidemic. The patient was contacted in regards to their therapy services. The patient is in agreement that they are safe and consent to being on hold for therapy services until the [facility] outpatient facilities reopen. At which time, the patient will be contacted to schedule an appointment to resume therapy services.    Joneen Boers PT, DPT   04/02/2019, 2:17 PM  [facility] [address] [place], [address] Phone: [phone]   Fax:  [phone]

## 2019-04-07 ENCOUNTER — Ambulatory Visit: Payer: Medicare Other

## 2019-04-14 ENCOUNTER — Ambulatory Visit: Payer: Medicare Other

## 2019-04-21 ENCOUNTER — Ambulatory Visit: Payer: Medicare Other

## 2019-04-28 ENCOUNTER — Ambulatory Visit: Payer: Medicare Other

## 2019-05-05 ENCOUNTER — Ambulatory Visit: Payer: Medicare Other

## 2019-05-13 ENCOUNTER — Ambulatory Visit: Payer: Medicare Other | Attending: Registered Nurse

## 2019-05-13 ENCOUNTER — Ambulatory Visit: Payer: Medicare Other

## 2019-05-13 ENCOUNTER — Telehealth: Payer: Self-pay

## 2019-05-13 NOTE — Telephone Encounter (Signed)
No show. Called patient to check up on him. Pt states that he's doing fine. Unable to make it to his next appointment on 05/15/2019 because he has a conflicting MD appointment that day which takes all day (stress test related). Wants to hold off PT until next month. Will call back to schedule another appointment when ready.

## 2019-05-15 ENCOUNTER — Ambulatory Visit: Payer: Medicare Other

## 2019-05-19 ENCOUNTER — Ambulatory Visit: Payer: Medicare Other

## 2019-05-26 ENCOUNTER — Ambulatory Visit: Payer: Medicare Other

## 2019-05-30 ENCOUNTER — Telehealth: Payer: Self-pay | Admitting: Urology

## 2019-05-30 NOTE — Telephone Encounter (Signed)
Patient declined to reschd [name] at this time due to [place]. He will call back once he feels safe to come into the hospital.   Sharyn Lull

## 2019-06-02 ENCOUNTER — Ambulatory Visit: Payer: Medicare Other

## 2019-06-23 DIAGNOSIS — R413 Other amnesia: Secondary | ICD-10-CM | POA: Diagnosis not present

## 2019-06-24 DIAGNOSIS — G3184 Mild cognitive impairment, so stated: Secondary | ICD-10-CM | POA: Diagnosis not present

## 2019-06-24 DIAGNOSIS — G479 Sleep disorder, unspecified: Secondary | ICD-10-CM | POA: Diagnosis not present

## 2019-06-24 DIAGNOSIS — Z8679 Personal history of other diseases of the circulatory system: Secondary | ICD-10-CM | POA: Diagnosis not present

## 2019-07-22 DIAGNOSIS — I10 Essential (primary) hypertension: Secondary | ICD-10-CM | POA: Diagnosis not present

## 2019-07-22 DIAGNOSIS — J4530 Mild persistent asthma, uncomplicated: Secondary | ICD-10-CM | POA: Diagnosis not present

## 2019-07-22 DIAGNOSIS — Z0000 Encounter for general adult medical examination without abnormal findings: Secondary | ICD-10-CM | POA: Diagnosis not present

## 2019-07-22 DIAGNOSIS — Z23 Encounter for immunization: Secondary | ICD-10-CM | POA: Diagnosis not present

## 2019-07-22 DIAGNOSIS — I2510 Atherosclerotic heart disease of native coronary artery without angina pectoris: Secondary | ICD-10-CM | POA: Diagnosis not present

## 2019-07-25 DIAGNOSIS — I2510 Atherosclerotic heart disease of native coronary artery without angina pectoris: Secondary | ICD-10-CM | POA: Diagnosis not present

## 2019-07-30 ENCOUNTER — Telehealth: Payer: Self-pay

## 2019-07-30 ENCOUNTER — Other Ambulatory Visit: Payer: Self-pay

## 2019-07-30 DIAGNOSIS — Z1211 Encounter for screening for malignant neoplasm of colon: Secondary | ICD-10-CM

## 2019-07-30 MED ORDER — NA SULFATE-K SULFATE-MG SULF 17.5-3.13-1.6 GM/177ML PO SOLN: 1.0000 | Freq: Once | ORAL | 0 refills | Status: AC

## 2019-07-30 NOTE — Telephone Encounter (Signed)
Gastroenterology Pre-Procedure Review  Request Date: 08/11/19 Requesting Physician: Dr. Vicente Males  PATIENT REVIEW QUESTIONS: The patient responded to the following health history questions as indicated:    1. Are you having any GI issues? no 2. Do you have a personal history of Polyps? no 3. Do you have a family history of Colon Cancer or Polyps? no 4. Diabetes Mellitus? no 5. Joint replacements in the past 12 months?no 6. Major health problems in the past 3 months?no 7. Any artificial heart valves, MVP, or defibrillator?no    MEDICATIONS & ALLERGIES:    Patient reports the following regarding taking any anticoagulation/antiplatelet therapy:   Plavix, Coumadin, Eliquis, Xarelto, Lovenox, Pradaxa, Brilinta, or Effient? no Aspirin? Yes 81 mg daily  Patient confirms/reports the following medications:  Current Outpatient Medications  Medication Sig Dispense Refill  . albuterol (PROVENTIL HFA) 108 (90 Base) MCG/ACT inhaler Inhale into the lungs.    Marland Kitchen aspirin EC 81 MG EC tablet Take 1 tablet (81 mg total) by mouth daily. 30 tablet 2  . atorvastatin (LIPITOR) 40 MG tablet Take 1 tablet (40 mg total) by mouth daily at 6 PM. 30 tablet 2  . buPROPion (WELLBUTRIN XL) 150 MG 24 hr tablet Take by mouth.    . diclofenac sodium (VOLTAREN) 1 % GEL Apply topically 4 (four) times daily.    Marland Kitchen donepezil (ARICEPT) 10 MG tablet Take by mouth.    . hydrochlorothiazide (MICROZIDE) 12.5 MG capsule HYDROCHLOROTHIAZIDE 12.5 MG CAPS    . lisinopril (PRINIVIL,ZESTRIL) 20 MG tablet Take by mouth.    . metoprolol tartrate (LOPRESSOR) 25 MG tablet Take 0.5 tablets (12.5 mg total) by mouth 2 (two) times daily. 60 tablet 2  . Na Sulfate-K Sulfate-Mg Sulf 17.5-3.13-1.6 GM/177ML SOLN Take 1 kit by mouth once for 1 dose. 354 mL 0  . tamsulosin (FLOMAX) 0.4 MG CAPS capsule Take 1 capsule (0.4 mg total) by mouth daily. 30 capsule 0  . traZODone (DESYREL) 50 MG tablet Take by mouth.     No current facility-administered  medications for this visit.     Patient confirms/reports the following allergies:  No Known Allergies  No orders of the defined types were placed in this encounter.   AUTHORIZATION INFORMATION Primary Insurance: 1D#: Group #:  Secondary Insurance: 1D#: Group #:  SCHEDULE INFORMATION: Date: 08/11/19 Time: Location:[facility]

## 2019-08-06 DIAGNOSIS — R0602 Shortness of breath: Secondary | ICD-10-CM | POA: Diagnosis not present

## 2019-08-06 DIAGNOSIS — I350 Nonrheumatic aortic (valve) stenosis: Secondary | ICD-10-CM | POA: Diagnosis not present

## 2019-08-07 ENCOUNTER — Other Ambulatory Visit: Admission: RE | Admit: 2019-08-07 | Payer: Medicare HMO | Source: Ambulatory Visit

## 2019-08-11 ENCOUNTER — Ambulatory Visit: Admission: RE | Admit: 2019-08-11 | Payer: Medicare HMO | Source: Home / Self Care | Admitting: Gastroenterology

## 2019-08-11 ENCOUNTER — Encounter: Admission: RE | Payer: Self-pay | Source: Home / Self Care

## 2019-08-11 SURGERY — COLONOSCOPY WITH PROPOFOL
Anesthesia: General

## 2019-08-13 DIAGNOSIS — I10 Essential (primary) hypertension: Secondary | ICD-10-CM | POA: Diagnosis not present

## 2019-08-13 DIAGNOSIS — I6523 Occlusion and stenosis of bilateral carotid arteries: Secondary | ICD-10-CM | POA: Diagnosis not present

## 2019-08-13 DIAGNOSIS — I25810 Atherosclerosis of coronary artery bypass graft(s) without angina pectoris: Secondary | ICD-10-CM | POA: Diagnosis not present

## 2019-08-13 DIAGNOSIS — E782 Mixed hyperlipidemia: Secondary | ICD-10-CM | POA: Diagnosis not present

## 2019-08-13 DIAGNOSIS — I350 Nonrheumatic aortic (valve) stenosis: Secondary | ICD-10-CM | POA: Diagnosis not present

## 2019-09-08 DIAGNOSIS — I10 Essential (primary) hypertension: Secondary | ICD-10-CM | POA: Diagnosis not present

## 2019-09-08 DIAGNOSIS — K219 Gastro-esophageal reflux disease without esophagitis: Secondary | ICD-10-CM | POA: Diagnosis not present

## 2019-09-22 DIAGNOSIS — I10 Essential (primary) hypertension: Secondary | ICD-10-CM | POA: Diagnosis not present

## 2019-09-22 DIAGNOSIS — I25810 Atherosclerosis of coronary artery bypass graft(s) without angina pectoris: Secondary | ICD-10-CM | POA: Diagnosis not present

## 2019-10-29 DIAGNOSIS — I10 Essential (primary) hypertension: Secondary | ICD-10-CM | POA: Diagnosis not present

## 2019-10-29 DIAGNOSIS — I25810 Atherosclerosis of coronary artery bypass graft(s) without angina pectoris: Secondary | ICD-10-CM | POA: Diagnosis not present

## 2019-11-26 DIAGNOSIS — I10 Essential (primary) hypertension: Secondary | ICD-10-CM | POA: Diagnosis not present

## 2019-11-26 DIAGNOSIS — K219 Gastro-esophageal reflux disease without esophagitis: Secondary | ICD-10-CM | POA: Diagnosis not present

## 2020-01-01 ENCOUNTER — Encounter: Payer: Self-pay | Admitting: Physician Assistant

## 2020-01-08 ENCOUNTER — Other Ambulatory Visit: Payer: Self-pay

## 2020-01-08 ENCOUNTER — Telehealth: Payer: Self-pay

## 2020-01-08 DIAGNOSIS — Z1211 Encounter for screening for malignant neoplasm of colon: Secondary | ICD-10-CM

## 2020-01-08 MED ORDER — NA SULFATE-K SULFATE-MG SULF 17.5-3.13-1.6 GM/177ML PO SOLN: 354.0000 mL | Freq: Once | ORAL | 0 refills | Status: AC

## 2020-01-08 NOTE — Telephone Encounter (Signed)
Gastroenterology Pre-Procedure Review    PATIENT REVIEW QUESTIONS: The patient responded to the following health history questions as indicated:    1. Are you having any GI issues? Yes some fecal leakage  2. Do you have a personal history of Polyps? no 3. Do you have a family history of Colon Cancer or Polyps? no 4. Diabetes Mellitus? no 5. Joint replacements in the past 12 months?no 6. Major health problems in the past 3 months?no 7. Any artificial heart valves, MVP, or defibrillator?no    MEDICATIONS & ALLERGIES:    Patient reports the following regarding taking any anticoagulation/antiplatelet therapy:   Plavix, Coumadin, Eliquis, Xarelto, Lovenox, Pradaxa, Brilinta, or Effient? no Aspirin?=yes Asprin 81mg    Patient confirms/reports the following medications:  Current Outpatient Medications  Medication Sig Dispense Refill  . albuterol (PROVENTIL HFA) 108 (90 Base) MCG/ACT inhaler Inhale into the lungs.    Marland Kitchen aspirin EC 81 MG EC tablet Take 1 tablet (81 mg total) by mouth daily. 30 tablet 2  . atorvastatin (LIPITOR) 40 MG tablet Take 1 tablet (40 mg total) by mouth daily at 6 PM. 30 tablet 2  . buPROPion (WELLBUTRIN XL) 150 MG 24 hr tablet Take by mouth.    . diclofenac sodium (VOLTAREN) 1 % GEL Apply topically 4 (four) times daily.    Marland Kitchen donepezil (ARICEPT) 10 MG tablet Take by mouth.    . hydrochlorothiazide (MICROZIDE) 12.5 MG capsule HYDROCHLOROTHIAZIDE 12.5 MG CAPS    . lisinopril (PRINIVIL,ZESTRIL) 20 MG tablet Take by mouth.    . metoprolol tartrate (LOPRESSOR) 25 MG tablet Take 0.5 tablets (12.5 mg total) by mouth 2 (two) times daily. 60 tablet 2  . tamsulosin (FLOMAX) 0.4 MG CAPS capsule Take 1 capsule (0.4 mg total) by mouth daily. 30 capsule 0  . traZODone (DESYREL) 50 MG tablet Take by mouth.     No current facility-administered medications for this visit.    Patient confirms/reports the following allergies:  No Known Allergies  No orders of the defined types were  placed in this encounter.   AUTHORIZATION INFORMATION Primary Insurance: 1D#: Group #:  Secondary Insurance: 1D#: Group #:  SCHEDULE INFORMATION: Date: 02/10/2020 Time: Location:[place]

## 2020-02-04 ENCOUNTER — Encounter: Payer: Self-pay | Admitting: Gastroenterology

## 2020-02-04 ENCOUNTER — Other Ambulatory Visit: Payer: Self-pay

## 2020-02-05 ENCOUNTER — Encounter: Payer: Self-pay | Admitting: Anesthesiology

## 2020-02-06 ENCOUNTER — Other Ambulatory Visit: Admission: RE | Admit: 2020-02-06 | Payer: Medicare (Managed Care) | Source: Ambulatory Visit

## 2020-02-10 ENCOUNTER — Ambulatory Visit
Admission: RE | Admit: 2020-02-10 | Payer: Medicare (Managed Care) | Source: Home / Self Care | Admitting: Gastroenterology

## 2020-02-10 HISTORY — DX: Headache, unspecified: R51.9

## 2020-02-10 HISTORY — DX: Specific reading disorder: F81.0

## 2020-02-10 HISTORY — DX: Presence of dental prosthetic device (complete) (partial): Z97.2

## 2020-02-10 SURGERY — COLONOSCOPY WITH PROPOFOL
Anesthesia: Choice

## 2020-05-31 ENCOUNTER — Inpatient Hospital Stay
Admission: EM | Admit: 2020-05-31 | Discharge: 2020-06-02 | DRG: 683 | Disposition: A | Discharge status: Home / Self Care | Payer: Medicare HMO | Attending: Internal Medicine | Admitting: Internal Medicine

## 2020-05-31 ENCOUNTER — Emergency Department: Payer: Medicare HMO

## 2020-05-31 ENCOUNTER — Other Ambulatory Visit: Payer: Self-pay

## 2020-05-31 DIAGNOSIS — G40909 Epilepsy, unspecified, not intractable, without status epilepticus: Secondary | ICD-10-CM | POA: Diagnosis not present

## 2020-05-31 DIAGNOSIS — I25810 Atherosclerosis of coronary artery bypass graft(s) without angina pectoris: Secondary | ICD-10-CM | POA: Diagnosis not present

## 2020-05-31 DIAGNOSIS — F17290 Nicotine dependence, other tobacco product, uncomplicated: Secondary | ICD-10-CM | POA: Diagnosis present

## 2020-05-31 DIAGNOSIS — R569 Unspecified convulsions: Secondary | ICD-10-CM | POA: Diagnosis not present

## 2020-05-31 DIAGNOSIS — Z79899 Other long term (current) drug therapy: Secondary | ICD-10-CM

## 2020-05-31 DIAGNOSIS — R69 Illness, unspecified: Secondary | ICD-10-CM | POA: Diagnosis not present

## 2020-05-31 DIAGNOSIS — E861 Hypovolemia: Secondary | ICD-10-CM | POA: Diagnosis present

## 2020-05-31 DIAGNOSIS — I10 Essential (primary) hypertension: Secondary | ICD-10-CM | POA: Diagnosis not present

## 2020-05-31 DIAGNOSIS — F1010 Alcohol abuse, uncomplicated: Secondary | ICD-10-CM | POA: Diagnosis not present

## 2020-05-31 DIAGNOSIS — N179 Acute kidney failure, unspecified: Secondary | ICD-10-CM | POA: Diagnosis not present

## 2020-05-31 DIAGNOSIS — R0902 Hypoxemia: Secondary | ICD-10-CM | POA: Diagnosis not present

## 2020-05-31 DIAGNOSIS — E871 Hypo-osmolality and hyponatremia: Secondary | ICD-10-CM | POA: Diagnosis present

## 2020-05-31 DIAGNOSIS — I959 Hypotension, unspecified: Secondary | ICD-10-CM | POA: Diagnosis present

## 2020-05-31 DIAGNOSIS — R519 Headache, unspecified: Secondary | ICD-10-CM | POA: Diagnosis not present

## 2020-05-31 DIAGNOSIS — E876 Hypokalemia: Secondary | ICD-10-CM | POA: Diagnosis not present

## 2020-05-31 DIAGNOSIS — R55 Syncope and collapse: Secondary | ICD-10-CM | POA: Diagnosis present

## 2020-05-31 DIAGNOSIS — E785 Hyperlipidemia, unspecified: Secondary | ICD-10-CM | POA: Diagnosis present

## 2020-05-31 DIAGNOSIS — G459 Transient cerebral ischemic attack, unspecified: Secondary | ICD-10-CM | POA: Diagnosis not present

## 2020-05-31 DIAGNOSIS — Z7982 Long term (current) use of aspirin: Secondary | ICD-10-CM

## 2020-05-31 DIAGNOSIS — Z8673 Personal history of transient ischemic attack (TIA), and cerebral infarction without residual deficits: Secondary | ICD-10-CM

## 2020-05-31 DIAGNOSIS — F0390 Unspecified dementia without behavioral disturbance: Secondary | ICD-10-CM | POA: Diagnosis present

## 2020-05-31 DIAGNOSIS — Z951 Presence of aortocoronary bypass graft: Secondary | ICD-10-CM

## 2020-05-31 DIAGNOSIS — I350 Nonrheumatic aortic (valve) stenosis: Secondary | ICD-10-CM | POA: Diagnosis present

## 2020-05-31 DIAGNOSIS — I252 Old myocardial infarction: Secondary | ICD-10-CM

## 2020-05-31 DIAGNOSIS — Z20822 Contact with and (suspected) exposure to covid-19: Secondary | ICD-10-CM | POA: Diagnosis present

## 2020-05-31 DIAGNOSIS — Z03818 Encounter for observation for suspected exposure to other biological agents ruled out: Secondary | ICD-10-CM | POA: Diagnosis not present

## 2020-05-31 DIAGNOSIS — I2510 Atherosclerotic heart disease of native coronary artery without angina pectoris: Secondary | ICD-10-CM | POA: Diagnosis present

## 2020-05-31 LAB — URINALYSIS, COMPLETE (UACMP) WITH MICROSCOPIC
Bacteria, UA: NONE SEEN
Bilirubin Urine: NEGATIVE
Glucose, UA: NEGATIVE mg/dL
Hgb urine dipstick: NEGATIVE
Ketones, ur: 5 mg/dL — AB
Leukocytes,Ua: NEGATIVE
Nitrite: NEGATIVE
Protein, ur: 30 mg/dL — AB
Specific Gravity, Urine: 1.017 (ref 1.005–1.030)
pH: 5 (ref 5.0–8.0)

## 2020-05-31 LAB — CBC WITH DIFFERENTIAL/PLATELET
Abs Immature Granulocytes: 0.08 10*3/uL — ABNORMAL HIGH (ref 0.00–0.07)
Basophils Absolute: 0.1 10*3/uL (ref 0.0–0.1)
Basophils Relative: 1 %
Eosinophils Absolute: 0.1 10*3/uL (ref 0.0–0.5)
Eosinophils Relative: 1 %
HCT: 35.7 % — ABNORMAL LOW (ref 39.0–52.0)
Hemoglobin: 12.3 g/dL — ABNORMAL LOW (ref 13.0–17.0)
Immature Granulocytes: 1 %
Lymphocytes Relative: 11 %
Lymphs Abs: 1.2 10*3/uL (ref 0.7–4.0)
MCH: 30.1 pg (ref 26.0–34.0)
MCHC: 34.5 g/dL (ref 30.0–36.0)
MCV: 87.5 fL (ref 80.0–100.0)
Monocytes Absolute: 1.3 10*3/uL — ABNORMAL HIGH (ref 0.1–1.0)
Monocytes Relative: 11 %
Neutro Abs: 8.7 10*3/uL — ABNORMAL HIGH (ref 1.7–7.7)
Neutrophils Relative %: 75 %
Platelets: 250 10*3/uL (ref 150–400)
RBC: 4.08 MIL/uL — ABNORMAL LOW (ref 4.22–5.81)
RDW: 17 % — ABNORMAL HIGH (ref 11.5–15.5)
WBC: 11.6 10*3/uL — ABNORMAL HIGH (ref 4.0–10.5)
nRBC: 0 % (ref 0.0–0.2)

## 2020-05-31 LAB — COMPREHENSIVE METABOLIC PANEL
ALT: 96 U/L — ABNORMAL HIGH (ref 0–44)
AST: 117 U/L — ABNORMAL HIGH (ref 15–41)
Albumin: 4 g/dL (ref 3.5–5.0)
Alkaline Phosphatase: 52 U/L (ref 38–126)
Anion gap: 15 (ref 5–15)
BUN: 32 mg/dL — ABNORMAL HIGH (ref 8–23)
CO2: 24 mmol/L (ref 22–32)
Calcium: 8.8 mg/dL — ABNORMAL LOW (ref 8.9–10.3)
Chloride: 94 mmol/L — ABNORMAL LOW (ref 98–111)
Creatinine, Ser: 2.2 mg/dL — ABNORMAL HIGH (ref 0.61–1.24)
GFR calc Af Amer: 36 mL/min — ABNORMAL LOW (ref >60)
GFR calc non Af Amer: 31 mL/min — ABNORMAL LOW (ref >60)
Glucose, Bld: 143 mg/dL — ABNORMAL HIGH (ref 70–99)
Potassium: 3.2 mmol/L — ABNORMAL LOW (ref 3.5–5.1)
Sodium: 133 mmol/L — ABNORMAL LOW (ref 135–145)
Total Bilirubin: 2.2 mg/dL — ABNORMAL HIGH (ref 0.3–1.2)
Total Protein: 7.9 g/dL (ref 6.5–8.1)

## 2020-05-31 LAB — AMMONIA: Ammonia: <9 umol/L — ABNORMAL LOW (ref 9–35)

## 2020-05-31 LAB — PHOSPHORUS: Phosphorus: 3.9 mg/dL (ref 2.5–4.6)

## 2020-05-31 LAB — URINE DRUG SCREEN, QUALITATIVE (ARMC ONLY)
Amphetamines, Ur Screen: NOT DETECTED
Barbiturates, Ur Screen: NOT DETECTED
Benzodiazepine, Ur Scrn: NOT DETECTED
Cannabinoid 50 Ng, Ur ~~LOC~~: NOT DETECTED
Cocaine Metabolite,Ur ~~LOC~~: NOT DETECTED
MDMA (Ecstasy)Ur Screen: NOT DETECTED
Methadone Scn, Ur: NOT DETECTED
Opiate, Ur Screen: NOT DETECTED
Phencyclidine (PCP) Ur S: NOT DETECTED
Tricyclic, Ur Screen: NOT DETECTED

## 2020-05-31 LAB — PROTIME-INR
INR: 1.1 (ref 0.8–1.2)
Prothrombin Time: 13.9 seconds (ref 11.4–15.2)

## 2020-05-31 LAB — MAGNESIUM: Magnesium: 1.9 mg/dL (ref 1.7–2.4)

## 2020-05-31 LAB — ETHANOL: Alcohol, Ethyl (B): <10 mg/dL (ref <10)

## 2020-05-31 LAB — TROPONIN I (HIGH SENSITIVITY)
Troponin I (High Sensitivity): 9 ng/L (ref <18)
Troponin I (High Sensitivity): 10 ng/L (ref <18)

## 2020-05-31 LAB — SARS CORONAVIRUS 2 BY RT PCR (HOSPITAL ORDER, PERFORMED IN ~~LOC~~ HOSPITAL LAB): SARS Coronavirus 2: NEGATIVE

## 2020-05-31 MED ORDER — SODIUM CHLORIDE 0.9 % IV SOLN: Freq: Once | INTRAVENOUS | Status: AC

## 2020-05-31 MED ORDER — PRAVASTATIN SODIUM 20 MG PO TABS
20.0000 mg | ORAL_TABLET | Freq: Every day | ORAL | Status: DC
  Administered 2020-06-01 – 2020-06-02 (×2): 20 mg via ORAL
  Filled 2020-05-31 (×3): qty 1

## 2020-05-31 MED ORDER — POTASSIUM CHLORIDE CRYS ER 20 MEQ PO TBCR
40.0000 meq | EXTENDED_RELEASE_TABLET | Freq: Once | ORAL | Status: AC
  Administered 2020-05-31: 40 meq via ORAL
  Filled 2020-05-31: qty 2

## 2020-05-31 MED ORDER — ASPIRIN EC 81 MG PO TBEC
81.0000 mg | DELAYED_RELEASE_TABLET | Freq: Every day | ORAL | Status: DC
  Administered 2020-06-01 – 2020-06-02 (×2): 81 mg via ORAL
  Filled 2020-05-31 (×2): qty 1

## 2020-05-31 MED ORDER — ALBUTEROL SULFATE (2.5 MG/3ML) 0.083% IN NEBU: 2.5000 mg | INHALATION_SOLUTION | Freq: Four times a day (QID) | RESPIRATORY_TRACT | Status: DC | PRN

## 2020-05-31 MED ORDER — ONDANSETRON HCL 4 MG/2ML IJ SOLN
4.0000 mg | Freq: Four times a day (QID) | INTRAMUSCULAR | Status: DC | PRN
  Filled 2020-05-31: qty 2

## 2020-05-31 MED ORDER — ONDANSETRON HCL 4 MG PO TABS: 4.0000 mg | ORAL_TABLET | Freq: Four times a day (QID) | ORAL | Status: DC | PRN

## 2020-05-31 MED ORDER — QUETIAPINE FUMARATE 25 MG PO TABS
25.0000 mg | ORAL_TABLET | Freq: Every day | ORAL | Status: DC
  Administered 2020-05-31 – 2020-06-01 (×2): 25 mg via ORAL
  Filled 2020-05-31 (×2): qty 1

## 2020-05-31 MED ORDER — LACTATED RINGERS IV BOLUS
1000.0000 mL | Freq: Once | INTRAVENOUS | Status: AC
  Administered 2020-05-31: 1000 mL via INTRAVENOUS

## 2020-05-31 MED ORDER — ENOXAPARIN SODIUM 40 MG/0.4ML ~~LOC~~ SOLN
40.0000 mg | SUBCUTANEOUS | Status: DC
  Administered 2020-05-31 – 2020-06-01 (×2): 40 mg via SUBCUTANEOUS
  Filled 2020-05-31 (×2): qty 0.4

## 2020-05-31 MED ORDER — ACETAMINOPHEN 650 MG RE SUPP: 650.0000 mg | Freq: Four times a day (QID) | RECTAL | Status: DC | PRN

## 2020-05-31 MED ORDER — POTASSIUM CHLORIDE 2 MEQ/ML IV SOLN
INTRAVENOUS | Status: DC
  Filled 2020-05-31 (×5): qty 1000

## 2020-05-31 MED ORDER — BUPROPION HCL ER (XL) 150 MG PO TB24
150.0000 mg | ORAL_TABLET | Freq: Every day | ORAL | Status: DC
  Administered 2020-06-01 – 2020-06-02 (×2): 150 mg via ORAL
  Filled 2020-05-31 (×2): qty 1

## 2020-05-31 MED ORDER — SODIUM CHLORIDE 0.9 % IV SOLN
500.0000 mg | Freq: Two times a day (BID) | INTRAVENOUS | Status: DC
  Filled 2020-05-31 (×2): qty 5

## 2020-05-31 MED ORDER — LORAZEPAM 2 MG/ML IJ SOLN
1.0000 mg | Freq: Once | INTRAMUSCULAR | Status: AC
  Administered 2020-05-31: 1 mg via INTRAVENOUS
  Filled 2020-05-31: qty 1

## 2020-05-31 MED ORDER — ALBUTEROL SULFATE HFA 108 (90 BASE) MCG/ACT IN AERS: 1.0000 | INHALATION_SPRAY | Freq: Four times a day (QID) | RESPIRATORY_TRACT | Status: DC | PRN

## 2020-05-31 MED ORDER — ACETAMINOPHEN 325 MG PO TABS: 650.0000 mg | ORAL_TABLET | Freq: Four times a day (QID) | ORAL | Status: DC | PRN

## 2020-05-31 MED ORDER — LEVETIRACETAM IN NACL 500 MG/100ML IV SOLN
500.0000 mg | Freq: Two times a day (BID) | INTRAVENOUS | Status: DC
  Administered 2020-05-31 – 2020-06-01 (×2): 500 mg via INTRAVENOUS
  Filled 2020-05-31 (×3): qty 100

## 2020-05-31 MED ORDER — SODIUM CHLORIDE 0.9 % IV BOLUS
1000.0000 mL | Freq: Once | INTRAVENOUS | Status: AC
  Administered 2020-05-31: 1000 mL via INTRAVENOUS

## 2020-05-31 NOTE — ED Provider Notes (Signed)
[facility] Emergency Department Provider Note   ____________________________________________   None    (approximate)  I have reviewed the triage vital signs and the nursing notes.   HISTORY  Chief Complaint Seizure-like Episode   HPI [name] is a 59 y.o. male with possible history of stroke, hypertension, CAD status post CABG, hyperlipidemia who presents to the ED for seizure-like episode.  Patient states that he was eating at a restaurant when he suddenly "felt weird and lightheaded".  He then reportedly fell to the ground and had generalized shaking activity witnessed by [employer].  He was slightly confused when EMS arrived, but mental status has gradually improved.  He was also initially hypotensive but given a 500 cc fluid bolus with improvement during transport.  Patient states that he has been feeling well recently with no fevers, cough, chest pain, shortness of breath, dysuria, or hematuria.  He states he had one isolated seizure 20 years ago but has never been on medications for seizures.  He does admit to regular alcohol consumption, about "2 to 3 glasses of wine every 3 to 4 days".  He denies any history of alcohol withdrawal and his last drink was a couple of days ago.        Past Medical History:  Diagnosis Date   CVA (cerebral infarction)    Headache    mild, since stroke 2012   Hypertension    MI (myocardial infarction) ([place]) 05/09/2012   Reading difficulty    pt reports he reads at "about a second grade level"   Stroke [facility]) 2012   numbness to left hand    Wears dentures    full upper and lower    Patient Active Problem List   Diagnosis Date Noted   Memory loss of unknown cause 10/14/2018   Sleeping difficulty 10/14/2018   Mild aortic valve stenosis 10/08/2018   Hyperlipidemia 03/27/2018   Cognitive impairment 03/12/2018   History of stroke 03/12/2018   Hypertension 03/01/2018   Atherosclerosis of  native arteries of extremity with intermittent claudication ([place]) 03/01/2018   Atherosclerotic peripheral vascular disease with intermittent claudication ([place]) 01/03/2018   Benign essential HTN 12/07/2017   Bilateral carotid artery stenosis 12/07/2017   SOBOE (shortness of breath on exertion) 12/07/2017   Acute cholecystitis 07/24/2016   Coronary artery disease involving coronary bypass graft of native heart without angina pectoris    Tobacco abuse     Past Surgical History:  Procedure Laterality Date   CAROTID ENDARTERECTOMY Left    2012 or 2013   CHOLECYSTECTOMY N/A 07/26/2016   Procedure: LAPAROSCOPIC CHOLECYSTECTOMY;  Surgeon: Florene Glen, MD;  Location: [facility] ORS;  Service: General;  Laterality: N/A;   CORONARY ARTERY BYPASS GRAFT  05/10/2012   3 vessel    Prior to Admission medications   Medication Sig Start Date End Date Taking? Authorizing Provider  albuterol (PROVENTIL HFA) 108 (90 Base) MCG/ACT inhaler Inhale into the lungs.    [provider]  aspirin EC 81 MG EC tablet Take 1 tablet (81 mg total) by mouth daily. 07/27/16   Vaughan Basta, MD  atorvastatin (LIPITOR) 40 MG tablet Take 1 tablet (40 mg total) by mouth daily at 6 PM. Patient not taking: Reported on 02/04/2020 07/27/16   Vaughan Basta, MD  buPROPion (WELLBUTRIN XL) 150 MG 24 hr tablet Take by mouth.    [provider]  diclofenac sodium (VOLTAREN) 1 % GEL Apply topically 4 (four) times daily.    [provider]  donepezil (ARICEPT) 10 MG tablet Take by mouth. 10/14/18 02/04/20  [provider]  hydrochlorothiazide (MICROZIDE) 12.5 MG capsule HYDROCHLOROTHIAZIDE 12.5 MG CAPS 07/03/17   [provider]  lisinopril (PRINIVIL,ZESTRIL) 20 MG tablet Take by mouth. 02/04/18 02/04/20  [provider]  metoprolol tartrate (LOPRESSOR) 25 MG tablet Take 0.5 tablets (12.5 mg total) by mouth 2 (two) times daily. 07/27/16   Vaughan Basta, MD   omeprazole (PRILOSEC) 20 MG capsule Take 20 mg by mouth daily.    [provider]  pravastatin (PRAVACHOL) 20 MG tablet Take 20 mg by mouth daily.    [provider]  QUEtiapine (SEROQUEL) 25 MG tablet Take 25 mg by mouth at bedtime.    [provider]  tamsulosin (FLOMAX) 0.4 MG CAPS capsule Take 1 capsule (0.4 mg total) by mouth daily. 01/14/19   Stoioff, Ronda Fairly, MD  traZODone (DESYREL) 50 MG tablet Take by mouth. 10/14/18 11/13/18  [provider]    Allergies Patient has no known allergies.  No family history on file.  Social History Social History   Tobacco Use   Smoking status: Former Smoker    Packs/day: 2.00    Years: 43.00    Pack years: 86.00    Types: Cigarettes    Quit date: 11/2019    Years since quitting: 0.5   Smokeless tobacco: Current User    Types: Snuff   Tobacco comment: changed to dip  Vaping Use   Vaping Use: Never used  Substance Use Topics   Alcohol use: Yes    Alcohol/week: 12.0 standard drinks    Types: 12 Cans of beer per week   Drug use: No    Review of Systems  Constitutional: No fever/chills. Eyes: No visual changes. ENT: No sore throat. Cardiovascular: Denies chest pain. Respiratory: Denies shortness of breath. Gastrointestinal: No abdominal pain.  No nausea, no vomiting.  No diarrhea.  No constipation. Genitourinary: Negative for dysuria. Musculoskeletal: Negative for back pain. Skin: Negative for rash. Neurological: Negative for headaches, focal weakness or numbness.  Positive for seizure-like activity.  ____________________________________________   PHYSICAL EXAM:  VITAL SIGNS: ED Triage Vitals  Enc Vitals Group     BP      Pulse      Resp      Temp      Temp src      SpO2      Weight      Height      Head Circumference      Peak Flow      Pain Score      Pain Loc      Pain Edu?      Excl. in [place]?     Constitutional: Alert and oriented. Eyes: Conjunctivae are  normal. Head: Atraumatic. Nose: No congestion/rhinnorhea. Mouth/Throat: Mucous membranes are moist. Neck: Normal ROM Cardiovascular: Normal rate, regular rhythm. Grossly normal heart sounds. Respiratory: Normal respiratory effort.  No retractions. Lungs CTAB. Gastrointestinal: Soft and nontender. No distention. Genitourinary: deferred Musculoskeletal: No lower extremity tenderness nor edema. Neurologic:  Normal speech and language. No gross focal neurologic deficits are appreciated other than patient's baseline left hand numbness. Skin:  Skin is warm, dry and intact. No rash noted. Psychiatric: Mood and affect are normal. Speech and behavior are normal.  ____________________________________________   LABS (all labs ordered are listed, but only abnormal results are displayed)  Labs Reviewed  COMPREHENSIVE METABOLIC PANEL - Abnormal; Notable for the following components:      Result  Value   Sodium 133 (*)    Potassium 3.2 (*)    Chloride 94 (*)    Glucose, Bld 143 (*)    BUN 32 (*)    Creatinine, Ser 2.20 (*)    Calcium 8.8 (*)    AST 117 (*)    ALT 96 (*)    Total Bilirubin 2.2 (*)    GFR calc non Af Amer 31 (*)    GFR calc Af Amer 36 (*)    All other components within normal limits  CBC WITH DIFFERENTIAL/PLATELET - Abnormal; Notable for the following components:   WBC 11.6 (*)    RBC 4.08 (*)    Hemoglobin 12.3 (*)    HCT 35.7 (*)    RDW 17.0 (*)    Neutro Abs 8.7 (*)    Monocytes Absolute 1.3 (*)    Abs Immature Granulocytes 0.08 (*)    All other components within normal limits  AMMONIA - Abnormal; Notable for the following components:   Ammonia <9 (*)    All other components within normal limits  SARS CORONAVIRUS 2 BY RT PCR (HOSPITAL ORDER, [place] [facility])  MAGNESIUM  ETHANOL  PROTIME-INR  URINE DRUG SCREEN, QUALITATIVE ([facility] ONLY)  URINALYSIS, COMPLETE (UACMP) WITH MICROSCOPIC  TROPONIN I (HIGH SENSITIVITY)  TROPONIN I (HIGH  SENSITIVITY)   ____________________________________________  EKG  ED ECG REPORT I, Blake Divine, the attending physician, personally viewed and interpreted this ECG.   Date: 05/31/2020  EKG Time: 15:15  Rate: 79  Rhythm: normal sinus rhythm  Axis: Normal  Intervals:nonspecific intraventricular conduction delay  ST&T Change: None   PROCEDURES  Procedure(s) performed (including Critical Care):  Procedures   ____________________________________________   INITIAL IMPRESSION / ASSESSMENT AND PLAN / ED COURSE       59 year old male with past medical history of CAD status post CABG, hyperlipidemia, hypertension, and stroke with residual left hand numbness presents to the ED for seizure-like episode witnessed at a [employer].  He is awake and alert upon arrival with no focal neurologic deficits other than his baseline left hand numbness.  We will check CT head and labs, also assess LFTs and ethanol level given his reported alcohol abuse.  He does not appear to be in acute alcohol withdrawal, but will give dose of Ativan to prevent recurrent seizure.  Blood pressure seems to have improved, will continue hydration with IV fluids.  CT head is negative for acute process.  Lab work is remarkable for elevation in AST and ALT as well as bilirubin, consistent with alcohol abuse.  No evidence of acute liver failure as his INR is within normal limits, ammonia is also normal.  On reassessment, patient now admits he drinks on a daily basis but it has been 3 to 4 days since his last drink.  He was given a dose of Ativan and at this point I suspect his seizure episode was due to alcohol withdrawal.  Lab work also remarkable for AKI, blood pressure improved with IV fluids.  Case was discussed with hospitalist for admission.      ____________________________________________   FINAL CLINICAL IMPRESSION(S) / ED DIAGNOSES  Final diagnoses:  Seizure ([place])  Alcohol abuse     ED Discharge  Orders    None       Note:  This document was prepared using Dragon voice recognition software and may include unintentional dictation errors.   Blake Divine, MD 05/31/20 1730

## 2020-05-31 NOTE — ED Notes (Signed)
Pt only able to urinate about 150cc. Will bladder scan soon. States sometimes urinates well and other times he doesn't. HOB adjusted for pt. States feels better than earlier. Urine dark amber. Pt given phone as ex wife Butch Penny called this RN and he stated he would take the call.

## 2020-05-31 NOTE — ED Notes (Signed)
Pt states baseline to be SOB on exertion. Seizure mats in place. Bed locked low. Call bell within reach. Rails up.

## 2020-05-31 NOTE — ED Notes (Signed)
Urinal attached to bedrail and pt prompted to use it so this RN can send sample.

## 2020-05-31 NOTE — ED Notes (Signed)
Pt denies dizziness, pain, and nausea.

## 2020-05-31 NOTE — ED Notes (Signed)
Provider [place] notified pt's BP 83/56; MAP 65; pt dizzy and nauseous. Will give zofran as is PRN. Awaiting further orders.

## 2020-05-31 NOTE — ED Notes (Signed)
Pt reminded to provide urine sample when he can. HOB adjusted for pt.

## 2020-05-31 NOTE — ED Notes (Signed)
Pt prompted again to try and urinate.

## 2020-05-31 NOTE — ED Notes (Signed)
Pt given food tray and drink with verbal okay from [place].

## 2020-05-31 NOTE — ED Notes (Signed)
Sherie, RN bladder scanned pt and got 0cc as well.

## 2020-05-31 NOTE — H&P (Addendum)
History and Physical:    [name]   ZDG:[id number] DOB: August 04, 1961 DOA: 05/31/2020  Referring MD/provider: Blake Divine, MD PCP: Crissie Figures, PA-C   Patient coming from: Home  Chief Complaint: Syncope  History of Present Illness:   [name] is a 59 y.o. male with medical history significant for stroke, CAD s/p CABG, remote history of seizure (20 years ago), hypertension who was brought to the hospital because of suspected seizure.  Patient said that he was placed in the [facility] he felt like "I was calling down".  He said he went to sit down after placing his order.  While he was sitting down, he noticed that he was falling but he caught the table and broke his fall.  He gradually lowered himself to the ground.  He said he started sweating afterwards.  He said he did not lose consciousness or pass out and he remembers everything that happened.  However, according to chart review, patient reportedly fell to the ground and had generalized shaking activity that was witnessed by [employer].  Apparently, he was confused when EMS arrived but his mental status gradually improved.  Reportedly, he was hypotensive and was given 500 cc of IV fluid bolus during transport to the emergency room.  Patient states he drinks about 3 glasses of wine every day but he has not drunk any alcohol for about 4 days now because he is trying to quit drinking.  He said he has not experienced any withdrawal symptoms since he stopped drinking 4 days ago.  He quit smoking cigarettes about 5 months ago but he has been dipping tobacco.  He said he does not use any drugs.  He has remote history of seizures 20 years ago.  He has no family history of seizures.  He also mentioned that he used to take 12.5 mg of HCTZ but his PCP increased it to 25 mg daily about 3 to 4 weeks ago.  He has no other complaints.  No chest pain, shortness of breath, palpitations, fever, chills, vomiting, diarrhea or  abdominal pain.  ED Course:  The patient   ROS:   ROS All other systems reviewed were negative  Past Medical History:   Past Medical History:  Diagnosis Date  . CVA (cerebral infarction)   . Headache    mild, since stroke 2012  . Hypertension   . MI (myocardial infarction) ([place]) 05/09/2012  . Reading difficulty    pt reports he reads at "about a second grade level"  . Stroke [facility]) 2012   numbness to left hand   . Wears dentures    full upper and lower    Past Surgical History:   Past Surgical History:  Procedure Laterality Date  . CAROTID ENDARTERECTOMY Left    2012 or 2013  . CHOLECYSTECTOMY N/A 07/26/2016   Procedure: LAPAROSCOPIC CHOLECYSTECTOMY;  Surgeon: Florene Glen, MD;  Location: [facility] ORS;  Service: General;  Laterality: N/A;  . CORONARY ARTERY BYPASS GRAFT  05/10/2012   3 vessel    Social History:   Social History   Socioeconomic History  . Marital status: Divorced    Spouse name: Not on file  . Number of children: Not on file  . Years of education: Not on file  . Highest education level: Not on file  Occupational History  . Not on file  Tobacco Use  . Smoking status: Former Smoker    Packs/day: 2.00    Years: 43.00    Pack  years: 86.00    Types: Cigarettes    Quit date: 11/2019    Years since quitting: 0.5  . Smokeless tobacco: Current User    Types: Snuff  . Tobacco comment: changed to dip  Vaping Use  . Vaping Use: Never used  Substance and Sexual Activity  . Alcohol use: Yes    Alcohol/week: 12.0 standard drinks    Types: 12 Cans of beer per week  . Drug use: No  . Sexual activity: Yes    Birth control/protection: None  Other Topics Concern  . Not on file  Social History Narrative  . Not on file   Social Determinants of Health   Financial Resource Strain:   . Difficulty of Paying Living Expenses:   Food Insecurity:   . Worried About Charity fundraiser in the Last Year:   . Arboriculturist in the Last Year:    Transportation Needs:   . Film/video editor (Medical):   Marland Kitchen Lack of Transportation (Non-Medical):   Physical Activity:   . Days of Exercise per Week:   . Minutes of Exercise per Session:   Stress:   . Feeling of Stress :   Social Connections:   . Frequency of Communication with Friends and Family:   . Frequency of Social Gatherings with Friends and Family:   . Attends Religious Services:   . Active Member of Clubs or Organizations:   . Attends Archivist Meetings:   Marland Kitchen Marital Status:   Intimate Partner Violence:   . Fear of Current or Ex-Partner:   . Emotionally Abused:   Marland Kitchen Physically Abused:   . Sexually Abused:     Allergies   Patient has no known allergies.  Family history:   History reviewed. No pertinent family history.  Current Medications:   Prior to Admission medications   Medication Sig Start Date End Date Taking? Authorizing Provider  albuterol (PROVENTIL HFA) 108 (90 Base) MCG/ACT inhaler Inhale into the lungs.    [provider]  aspirin EC 81 MG EC tablet Take 1 tablet (81 mg total) by mouth daily. 07/27/16   Vaughan Basta, MD  atorvastatin (LIPITOR) 40 MG tablet Take 1 tablet (40 mg total) by mouth daily at 6 PM. Patient not taking: Reported on 02/04/2020 07/27/16   Vaughan Basta, MD  buPROPion (WELLBUTRIN XL) 150 MG 24 hr tablet Take by mouth.    [provider]  diclofenac sodium (VOLTAREN) 1 % GEL Apply topically 4 (four) times daily.    [provider]  donepezil (ARICEPT) 10 MG tablet Take by mouth. 10/14/18 02/04/20  [provider]  hydrochlorothiazide (MICROZIDE) 12.5 MG capsule HYDROCHLOROTHIAZIDE 12.5 MG CAPS 07/03/17   [provider]  lisinopril (PRINIVIL,ZESTRIL) 20 MG tablet Take by mouth. 02/04/18 02/04/20  [provider]  metoprolol tartrate (LOPRESSOR) 25 MG tablet Take 0.5 tablets (12.5 mg total) by mouth 2 (two) times daily. 07/27/16   Vaughan Basta, MD   omeprazole (PRILOSEC) 20 MG capsule Take 20 mg by mouth daily.    [provider]  pravastatin (PRAVACHOL) 20 MG tablet Take 20 mg by mouth daily.    [provider]  QUEtiapine (SEROQUEL) 25 MG tablet Take 25 mg by mouth at bedtime.    [provider]  tamsulosin (FLOMAX) 0.4 MG CAPS capsule Take 1 capsule (0.4 mg total) by mouth daily. 01/14/19   Stoioff, Ronda Fairly, MD  traZODone (DESYREL) 50 MG tablet Take by mouth. 10/14/18 11/13/18  [provider]    Physical Exam:   Vitals:   05/31/20 1539 05/31/20 1545 05/31/20 1635 05/31/20 1700  BP:   104/68 115/69  Pulse:  81 81 79  Resp:  15 15   Temp:      TempSrc:      SpO2:  97% 97%   Weight: 108.9 kg     Height: 6' (1.829 m)        Physical Exam: Blood pressure 115/69, pulse 79, temperature 97.6 F (36.4 C), temperature source Oral, resp. rate 15, height 6' (1.829 m), weight 108.9 kg, SpO2 97 %. Gen: No acute distress. Head: Normocephalic, atraumatic. Eyes: Pupils equal, round and reactive to light. Extraocular movements intact.  Sclerae nonicteric.  Mouth: Moist mucous membranes Neck: Supple, no thyromegaly, no lymphadenopathy, no jugular venous distention. Chest: Lungs are clear to auscultation with good air movement. No rales, rhonchi or wheezes.  CV: Heart sounds are regular with an S1, S2. No murmurs, rubs or gallops.  Abdomen: Soft, nontender, obese with normal active bowel sounds. No palpable masses. Extremities: Extremities are without clubbing, or cyanosis. No edema. Pedal pulses 2+.  Skin: Warm and dry. No rashes, lesions or wounds Neuro: Alert and oriented times 3; grossly nonfocal.  Psych: Insight is good and judgment is appropriate. Mood and affect normal.   Data Review:    Labs: Basic Metabolic Panel: Recent Labs  Lab 05/31/20 1522  NA 133*  K 3.2*  CL 94*  CO2 24  GLUCOSE 143*  BUN 32*  CREATININE 2.20*  CALCIUM 8.8*  MG 1.9  PHOS 3.9   Liver Function  Tests: Recent Labs  Lab 05/31/20 1522  AST 117*  ALT 96*  ALKPHOS 52  BILITOT 2.2*  PROT 7.9  ALBUMIN 4.0   No results for input(s): LIPASE, AMYLASE in the last 168 hours. Recent Labs  Lab 05/31/20 1634  AMMONIA <9*   CBC: Recent Labs  Lab 05/31/20 1522  WBC 11.6*  NEUTROABS 8.7*  HGB 12.3*  HCT 35.7*  MCV 87.5  PLT 250   Cardiac Enzymes: No results for input(s): CKTOTAL, CKMB, CKMBINDEX, TROPONINI in the last 168 hours.  BNP (last 3 results) No results for input(s): PROBNP in the last 8760 hours. CBG: No results for input(s): GLUCAP in the last 168 hours.  Urinalysis    Component Value Date/Time   COLORURINE Yellow 09/09/2014 1708   APPEARANCEUR Clear 09/09/2014 1708   LABSPEC 1.011 09/09/2014 1708   PHURINE 5.0 09/09/2014 1708   GLUCOSEU Negative 09/09/2014 1708   HGBUR Negative 09/09/2014 1708   BILIRUBINUR Negative 09/09/2014 1708   KETONESUR Negative 09/09/2014 1708   PROTEINUR Negative 09/09/2014 1708   NITRITE Negative 09/09/2014 1708   LEUKOCYTESUR Negative 09/09/2014 1708      Radiographic Studies: CT Head Wo Contrast  Result Date: 05/31/2020 CLINICAL DATA:  Seizure, nontraumatic. MR head without contrast 12/27/2013 pre syncopal episode. Possible seizure activity. Personal history of seizures. EXAM: CT HEAD WITHOUT CONTRAST TECHNIQUE: Contiguous axial images were obtained from the base of the skull through the vertex without intravenous contrast. COMPARISON:  None. FINDINGS: Brain: No acute infarct, hemorrhage, or mass lesion is present. Remote right frontal lobe MCA territory infarct is stable. Basal ganglia are intact. Insular ribbon is normal. The ventricles are of normal size. No significant extraaxial fluid collection is present. Insert normal brainstem. Vascular: Atherosclerotic changes are present within the cavernous internal carotid arteries bilaterally and at the dural margin of the left vertebral artery. No hyperdense vessel is present.  Skull: Calvarium is intact. No focal lytic or blastic lesions are present. No significant extracranial soft tissue lesion is present. Sinuses/Orbits: The paranasal sinuses and mastoid air cells are clear. The globes and orbits are within normal limits. IMPRESSION: 1. Stable remote right frontal lobe MCA territory infarct. 2. No acute intracranial abnormality or significant interval change. No other acute or focal abnormality to explain seizures. Electronically Signed   By: San Morelle M.D.   On: 05/31/2020 16:39    EKG: Independently reviewed.  Normal sinus rhythm, prolonged QTC 506   Assessment/Plan:   Principal Problem:   Syncope and collapse Active Problems:   Coronary artery disease involving coronary bypass graft of native heart without angina pectoris   AKI (acute kidney injury) (HCC)   Hypokalemia   S/p syncope versus seizures: Admit to MedSurg and monitor on telemetry.  It is not clear whether patient had a syncopal episode because he was hypotensive on whether he actually had a seizure (? Alcohol withdrawal seizure in a patient with remote history of seizure).  Seizure precautions.  Obtain EEG for further evaluation.  Treat with IV Keppra pending neurology consult.  Consult neurologist for further evaluation.  Obtain 2D echo because patient has history of mild aortic stenosis  Acute kidney injury: Last creatinine on electronic medical record was 0.69 in 2017.  Treat with IV fluids and monitor BMP.  Hold lisinopril and HCTZ  Hypotension: Hold antihypertensives.  Treat with IV fluids.  Hypovolemic hyponatremia: Treat with IV fluids and monitor BMP  Hypokalemia: Replete potassium and monitor level.  History of stroke and CAD s/p CABG: Continue aspirin and pravastatin  Alcohol use disorder:  Body mass index is 32.55 kg/m.  Other information:   DVT prophylaxis: enoxaparin (LOVENOX) injection 30 mg Start: 05/31/20 1800Lovenox  Code Status: Full code. Family  Communication: Plan discussed with patient Disposition Plan: Possible discharge to home in 1 to 2 days depending on clinical improvement Consults called: Consult neurologist tomorrow Admission status: Observation  The medical decision making on this patient was of high complexity and the patient is at high risk for clinical deterioration, therefore this is a level 3 visit.    Gloristine Turrubiates [facility]   How to contact the [facility] Attending or Consulting provider [place] or covering provider during after hours [place], for this patient?   1. Check the care team in [facility] and look for a) attending/consulting [facility] provider listed and b) the [facility] team listed 2. Log into [email or web address] and use [place]'s universal password to access. If you do not have the password, please contact the hospital operator. 3. Locate the [facility] provider you are looking for under [facility] and page to a number that you can be directly reached. 4. If you still have difficulty reaching the provider, please page the [facility] (Director on Call) for the Hospitalists listed on amion for assistance.  05/31/2020, 6:09 PM

## 2020-05-31 NOTE — ED Notes (Signed)
Pt assisted with peri care. Had loose yellow/brown BM and urinated on self before arrival to  ER. Changed into gown.

## 2020-05-31 NOTE — ED Notes (Signed)
Potassium chloride (as mixed in LR) not yet tested with keppra for compatibility. Will be started once Keppra finished.

## 2020-05-31 NOTE — ED Notes (Signed)
0cc noted upon bladder scan. Will have NT or 2nd RN try. Pt's abdomen distended/soft. Pt denies pain in abdomen and states it is his baseline.

## 2020-05-31 NOTE — ED Triage Notes (Signed)
Pt in from restaurant d/t possible seizure activity. History of seizures over 20 years ago; never medicated for them. Pt not post-ictal when EMS arrived. BP 88/58 initially then 132/60 after 500cc fluid by EMS through 18g IV. Pt did urinate on self. Pt currently A&O. CBG WDL per EMS. Resp reg/unlabored.

## 2020-06-01 ENCOUNTER — Observation Stay
Admit: 2020-06-01 | Discharge: 2020-06-01 | Disposition: A | Discharge status: Home / Self Care | Payer: Medicare HMO | Attending: Internal Medicine | Admitting: Internal Medicine

## 2020-06-01 DIAGNOSIS — R55 Syncope and collapse: Secondary | ICD-10-CM | POA: Diagnosis not present

## 2020-06-01 DIAGNOSIS — I10 Essential (primary) hypertension: Secondary | ICD-10-CM | POA: Diagnosis present

## 2020-06-01 DIAGNOSIS — I350 Nonrheumatic aortic (valve) stenosis: Secondary | ICD-10-CM | POA: Diagnosis present

## 2020-06-01 DIAGNOSIS — E871 Hypo-osmolality and hyponatremia: Secondary | ICD-10-CM | POA: Diagnosis not present

## 2020-06-01 DIAGNOSIS — Z8673 Personal history of transient ischemic attack (TIA), and cerebral infarction without residual deficits: Secondary | ICD-10-CM | POA: Diagnosis not present

## 2020-06-01 DIAGNOSIS — E785 Hyperlipidemia, unspecified: Secondary | ICD-10-CM | POA: Diagnosis not present

## 2020-06-01 DIAGNOSIS — Z951 Presence of aortocoronary bypass graft: Secondary | ICD-10-CM | POA: Diagnosis not present

## 2020-06-01 DIAGNOSIS — Z79899 Other long term (current) drug therapy: Secondary | ICD-10-CM | POA: Diagnosis not present

## 2020-06-01 DIAGNOSIS — N179 Acute kidney failure, unspecified: Secondary | ICD-10-CM | POA: Diagnosis not present

## 2020-06-01 DIAGNOSIS — I959 Hypotension, unspecified: Secondary | ICD-10-CM | POA: Diagnosis present

## 2020-06-01 DIAGNOSIS — F0390 Unspecified dementia without behavioral disturbance: Secondary | ICD-10-CM | POA: Diagnosis present

## 2020-06-01 DIAGNOSIS — I25810 Atherosclerosis of coronary artery bypass graft(s) without angina pectoris: Secondary | ICD-10-CM | POA: Diagnosis not present

## 2020-06-01 DIAGNOSIS — E876 Hypokalemia: Secondary | ICD-10-CM | POA: Diagnosis not present

## 2020-06-01 DIAGNOSIS — F1010 Alcohol abuse, uncomplicated: Secondary | ICD-10-CM | POA: Diagnosis present

## 2020-06-01 DIAGNOSIS — I2510 Atherosclerotic heart disease of native coronary artery without angina pectoris: Secondary | ICD-10-CM | POA: Diagnosis not present

## 2020-06-01 DIAGNOSIS — E861 Hypovolemia: Secondary | ICD-10-CM | POA: Diagnosis not present

## 2020-06-01 DIAGNOSIS — R569 Unspecified convulsions: Secondary | ICD-10-CM | POA: Diagnosis present

## 2020-06-01 DIAGNOSIS — I252 Old myocardial infarction: Secondary | ICD-10-CM | POA: Diagnosis not present

## 2020-06-01 DIAGNOSIS — Z20822 Contact with and (suspected) exposure to covid-19: Secondary | ICD-10-CM | POA: Diagnosis not present

## 2020-06-01 DIAGNOSIS — R69 Illness, unspecified: Secondary | ICD-10-CM | POA: Diagnosis not present

## 2020-06-01 DIAGNOSIS — Z7982 Long term (current) use of aspirin: Secondary | ICD-10-CM | POA: Diagnosis not present

## 2020-06-01 DIAGNOSIS — F17290 Nicotine dependence, other tobacco product, uncomplicated: Secondary | ICD-10-CM | POA: Diagnosis present

## 2020-06-01 LAB — ECHOCARDIOGRAM COMPLETE
Height: 72 in
Weight: 3848.35 oz

## 2020-06-01 LAB — CBC
HCT: 33.2 % — ABNORMAL LOW (ref 39.0–52.0)
Hemoglobin: 11.5 g/dL — ABNORMAL LOW (ref 13.0–17.0)
MCH: 31 pg (ref 26.0–34.0)
MCHC: 34.6 g/dL (ref 30.0–36.0)
MCV: 89.5 fL (ref 80.0–100.0)
Platelets: 213 10*3/uL (ref 150–400)
RBC: 3.71 MIL/uL — ABNORMAL LOW (ref 4.22–5.81)
RDW: 17.2 % — ABNORMAL HIGH (ref 11.5–15.5)
WBC: 9.1 10*3/uL (ref 4.0–10.5)
nRBC: 0 % (ref 0.0–0.2)

## 2020-06-01 LAB — COMPREHENSIVE METABOLIC PANEL
ALT: 78 U/L — ABNORMAL HIGH (ref 0–44)
AST: 78 U/L — ABNORMAL HIGH (ref 15–41)
Albumin: 3.2 g/dL — ABNORMAL LOW (ref 3.5–5.0)
Alkaline Phosphatase: 40 U/L (ref 38–126)
Anion gap: 9 (ref 5–15)
BUN: 39 mg/dL — ABNORMAL HIGH (ref 8–23)
CO2: 25 mmol/L (ref 22–32)
Calcium: 8.4 mg/dL — ABNORMAL LOW (ref 8.9–10.3)
Chloride: 100 mmol/L (ref 98–111)
Creatinine, Ser: 1.98 mg/dL — ABNORMAL HIGH (ref 0.61–1.24)
GFR calc Af Amer: 41 mL/min — ABNORMAL LOW (ref >60)
GFR calc non Af Amer: 35 mL/min — ABNORMAL LOW (ref >60)
Glucose, Bld: 103 mg/dL — ABNORMAL HIGH (ref 70–99)
Potassium: 3.9 mmol/L (ref 3.5–5.1)
Sodium: 134 mmol/L — ABNORMAL LOW (ref 135–145)
Total Bilirubin: 1.3 mg/dL — ABNORMAL HIGH (ref 0.3–1.2)
Total Protein: 6.6 g/dL (ref 6.5–8.1)

## 2020-06-01 LAB — HIV ANTIBODY (ROUTINE TESTING W REFLEX): HIV Screen 4th Generation wRfx: NONREACTIVE

## 2020-06-01 MED ORDER — PERFLUTREN LIPID MICROSPHERE
1.0000 mL | INTRAVENOUS | Status: AC | PRN
  Administered 2020-06-01: 2 mL via INTRAVENOUS
  Filled 2020-06-01: qty 10

## 2020-06-01 MED ORDER — LACTATED RINGERS IV SOLN: INTRAVENOUS | Status: DC

## 2020-06-01 NOTE — Procedures (Signed)
Patient Name: [name]  MRN: [id number]  Epilepsy Attending: Lora Havens  Referring Physician/Provider: Dr Jennye Boroughs Date: 06/01/2020 Duration: 23.34 mins  Patient history: 59 y.o. male malewith medical history significant for stroke, CADs/p CABG, remote history of seizure (20 years ago) not on anti epileptics, hypertension who was brought to the hospital because of suspected seizure vs syncopal event.  EEG to evaluate for seizures  Level of alertness: Awake, drowsy, sleep, comatose, lethargic  AEDs during EEG study: None  Technical aspects: This EEG study was done with scalp electrodes positioned according to the 10-20 International system of electrode placement. Electrical activity was acquired at a sampling rate of 500Hz  and reviewed with a high frequency filter of 70Hz  and a low frequency filter of 1Hz . EEG data were recorded continuously and digitally stored.   Description: The posterior dominant rhythm consists of 8 Hz activity of moderate voltage (25-35 uV) seen predominantly in posterior head regions, symmetric and reactive to eye opening and eye closing. Physiology photic driving was not seen during photic stimulation.  Hyperventilation was not performed.     IMPRESSION: This study is within normal limits. No seizures or epileptiform discharges were seen throughout the recording.  [name] Barbra Sarks

## 2020-06-01 NOTE — Progress Notes (Signed)
eeg completed ° °

## 2020-06-01 NOTE — Progress Notes (Signed)
*  PRELIMINARY RESULTS* Echocardiogram 2D Echocardiogram has been performed.  [name] 06/01/2020, 12:14 PM

## 2020-06-01 NOTE — Plan of Care (Signed)

## 2020-06-01 NOTE — Consult Note (Signed)
Reason for Consult: syncope  Requesting Physician: Dr. Mal Misty  CC: syncope   HPI: [name] is an 59 y.o. male male with medical history significant for stroke, CAD s/p CABG, remote history of seizure (20 years ago) not on anti epileptics, hypertension who was brought to the hospital because of suspected seizure vs syncopal event. Pt was in restaurant felt light headed and slid himself to the floor. Bystanders did say they suspected shaking and seizure type of activity. Pt was hypotensive on initial evaluation by EMS. Currently close to baseline    Past Medical History:  Diagnosis Date  . CVA (cerebral infarction)   . Headache    mild, since stroke 2012  . Hypertension   . MI (myocardial infarction) ([place]) 05/09/2012  . Reading difficulty    pt reports he reads at "about a second grade level"  . Stroke [facility]) 2012   numbness to left hand   . Wears dentures    full upper and lower    Past Surgical History:  Procedure Laterality Date  . CAROTID ENDARTERECTOMY Left    2012 or 2013  . CHOLECYSTECTOMY N/A 07/26/2016   Procedure: LAPAROSCOPIC CHOLECYSTECTOMY;  Surgeon: Florene Glen, MD;  Location: [facility] ORS;  Service: General;  Laterality: N/A;  . CORONARY ARTERY BYPASS GRAFT  05/10/2012   3 vessel    History reviewed. No pertinent family history.  Social History:  reports that he quit smoking about 6 months ago. His smoking use included cigarettes. He has a 86.00 pack-year smoking history. His smokeless tobacco use includes snuff. He reports current alcohol use of about 12.0 standard drinks of alcohol per week. He reports that he does not use drugs.  No Known Allergies  Medications: I have reviewed the patient's current medications.  ROS: History obtained from the patient  General ROS: negative for - chills, fatigue, fever, night sweats, weight gain or weight loss Psychological ROS: negative for - behavioral disorder, hallucinations, memory difficulties, mood swings or  suicidal ideation Ophthalmic ROS: negative for - blurry vision, double vision, eye pain or loss of vision ENT ROS: negative for - epistaxis, nasal discharge, oral lesions, sore throat, tinnitus or vertigo Allergy and Immunology ROS: negative for - hives or itchy/watery eyes Hematological and Lymphatic ROS: negative for - bleeding problems, bruising or swollen lymph nodes Endocrine ROS: negative for - galactorrhea, hair pattern changes, polydipsia/polyuria or temperature intolerance Respiratory ROS: negative for - cough, hemoptysis, shortness of breath or wheezing Cardiovascular ROS: negative for - chest pain, dyspnea on exertion, edema or irregular heartbeat Gastrointestinal ROS: negative for - abdominal pain, diarrhea, hematemesis, nausea/vomiting or stool incontinence Genito-Urinary ROS: negative for - dysuria, hematuria, incontinence or urinary frequency/urgency Musculoskeletal ROS: negative for - joint swelling or muscular weakness Neurological ROS: as noted in HPI Dermatological ROS: negative for rash and skin lesion changes  Physical Examination: Blood pressure 109/68, pulse 68, temperature 98.5 F (36.9 C), temperature source Oral, resp. rate 17, height 6' (1.829 m), weight 109.1 kg, SpO2 97 %.   Neurological Examination   Mental Status: Alert, oriented, thought content appropriate.  Speech fluent without evidence of aphasia.  Able to follow 3 step commands without difficulty. Cranial Nerves: II: Discs flat bilaterally; Visual fields grossly normal, pupils equal, round, reactive to light and accommodation III,IV, VI: ptosis not present, extra-ocular motions intact bilaterally V,VII: smile symmetric, facial light touch sensation normal bilaterally VIII: hearing normal bilaterally IX,X: gag reflex present XI: bilateral shoulder shrug XII: midline tongue extension Motor: Right : Upper extremity  5/5    Left:     Upper extremity   5/5  Lower extremity   5/5     Lower extremity    5/5 Tone and bulk:normal tone throughout; no atrophy noted Sensory: Pinprick and light touch intact throughout, bilaterally Deep Tendon Reflexes: 2+ and symmetric throughout Plantars: Right: downgoing   Left: downgoing Cerebellar: normal finger-to-nose, normal rapid alternating movements and normal heel-to-shin test Gait: not tested       Laboratory Studies:   Basic Metabolic Panel: Recent Labs  Lab 05/31/20 1522 06/01/20 0458  NA 133* 134*  K 3.2* 3.9  CL 94* 100  CO2 24 25  GLUCOSE 143* 103*  BUN 32* 39*  CREATININE 2.20* 1.98*  CALCIUM 8.8* 8.4*  MG 1.9  --   PHOS 3.9  --     Liver Function Tests: Recent Labs  Lab 05/31/20 1522 06/01/20 0458  AST 117* 78*  ALT 96* 78*  ALKPHOS 52 40  BILITOT 2.2* 1.3*  PROT 7.9 6.6  ALBUMIN 4.0 3.2*   No results for input(s): LIPASE, AMYLASE in the last 168 hours. Recent Labs  Lab 05/31/20 1634  AMMONIA <9*    CBC: Recent Labs  Lab 05/31/20 1522 06/01/20 0458  WBC 11.6* 9.1  NEUTROABS 8.7*  --   HGB 12.3* 11.5*  HCT 35.7* 33.2*  MCV 87.5 89.5  PLT 250 213    Cardiac Enzymes: No results for input(s): CKTOTAL, CKMB, CKMBINDEX, TROPONINI in the last 168 hours.  BNP: Invalid input(s): POCBNP  CBG: No results for input(s): GLUCAP in the last 168 hours.  Microbiology: Results for orders placed or performed during the hospital encounter of 05/31/20  SARS Coronavirus 2 by RT PCR (hospital order, performed in [facility] hospital lab) Nasopharyngeal Nasopharyngeal Swab     Status: None   Collection Time: 05/31/20  5:11 PM   Specimen: Nasopharyngeal Swab  Result Value Ref Range Status   SARS Coronavirus 2 NEGATIVE NEGATIVE Final    Comment: (NOTE) SARS-CoV-2 target nucleic acids are NOT DETECTED.  The SARS-CoV-2 RNA is generally detectable in upper and lower respiratory specimens during the acute phase of infection. The lowest concentration of SARS-CoV-2 viral copies this assay can detect is 250 copies /  mL. A negative result does not preclude SARS-CoV-2 infection and should not be used as the sole basis for treatment or other patient management decisions.  A negative result may occur with improper specimen collection / handling, submission of specimen other than nasopharyngeal swab, presence of viral mutation(s) within the areas targeted by this assay, and inadequate number of viral copies (<250 copies / mL). A negative result must be combined with clinical observations, patient history, and epidemiological information.  Fact Sheet for Patients:   StrictlyIdeas.no  Fact Sheet for Healthcare Providers: BankingDealers.co.za  This test is not yet approved or  cleared by the [place] FDA and has been authorized for detection and/or diagnosis of SARS-CoV-2 by FDA under an Emergency Use Authorization (EUA).  This EUA will remain in effect (meaning this test can be used) for the duration of the COVID-19 declaration under Section 564(b)(1) of the Act, 21 U.S.C. section 360bbb-3(b)(1), unless the authorization is terminated or revoked sooner.  Performed at [facility], [address] [place], [place] [address]     Coagulation Studies: Recent Labs    05/31/20 1634  LABPROT 13.9  INR 1.1    Urinalysis:  Recent Labs  Lab 05/31/20 Anna 1.017  PHURINE  5.0  GLUCOSEU NEGATIVE  HGBUR NEGATIVE  BILIRUBINUR NEGATIVE  KETONESUR 5*  PROTEINUR 30*  NITRITE NEGATIVE  LEUKOCYTESUR NEGATIVE    Lipid Panel:     Component Value Date/Time   CHOL 152 07/25/2016 0459   CHOL 207 (H) 12/02/2013 1552   TRIG 83 07/25/2016 0459   TRIG 166 12/02/2013 1552   HDL 29 (L) 07/25/2016 0459   HDL 49 12/02/2013 1552   CHOLHDL 5.2 07/25/2016 0459   VLDL 17 07/25/2016 0459   VLDL 33 12/02/2013 1552   LDLCALC 106 (H) 07/25/2016 0459   LDLCALC 125 (H) 12/02/2013 1552    HgbA1C:  Lab Results  Component  Value Date   HGBA1C 5.3 12/02/2013    Urine Drug Screen:      Component Value Date/Time   LABOPIA NONE DETECTED 05/31/2020 1522   LABOPIA NONE DETECTED 09/03/2011 1218   COCAINSCRNUR NONE DETECTED 05/31/2020 1522   LABBENZ NONE DETECTED 05/31/2020 1522   LABBENZ NONE DETECTED 09/03/2011 1218   AMPHETMU NONE DETECTED 05/31/2020 1522   AMPHETMU NONE DETECTED 09/03/2011 1218   THCU NONE DETECTED 05/31/2020 1522   THCU NONE DETECTED 09/03/2011 1218   LABBARB NONE DETECTED 05/31/2020 1522   LABBARB NONE DETECTED 09/03/2011 1218    Alcohol Level:  Recent Labs  Lab 05/31/20 1522  ETH <10    Other results: EKG: normal EKG, normal sinus rhythm, unchanged from previous tracings.  Imaging: CT Head Wo Contrast  Result Date: 05/31/2020 CLINICAL DATA:  Seizure, nontraumatic. MR head without contrast 12/27/2013 pre syncopal episode. Possible seizure activity. Personal history of seizures. EXAM: CT HEAD WITHOUT CONTRAST TECHNIQUE: Contiguous axial images were obtained from the base of the skull through the vertex without intravenous contrast. COMPARISON:  None. FINDINGS: Brain: No acute infarct, hemorrhage, or mass lesion is present. Remote right frontal lobe MCA territory infarct is stable. Basal ganglia are intact. Insular ribbon is normal. The ventricles are of normal size. No significant extraaxial fluid collection is present. Insert normal brainstem. Vascular: Atherosclerotic changes are present within the cavernous internal carotid arteries bilaterally and at the dural margin of the left vertebral artery. No hyperdense vessel is present. Skull: Calvarium is intact. No focal lytic or blastic lesions are present. No significant extracranial soft tissue lesion is present. Sinuses/Orbits: The paranasal sinuses and mastoid air cells are clear. The globes and orbits are within normal limits. IMPRESSION: 1. Stable remote right frontal lobe MCA territory infarct. 2. No acute intracranial abnormality or  significant interval change. No other acute or focal abnormality to explain seizures. Electronically Signed   By: San Morelle M.D.   On: 05/31/2020 16:39     Assessment/Plan:   59 y.o. male male with medical history significant for stroke, CAD s/p CABG, remote history of seizure (20 years ago) not on anti epileptics, hypertension who was brought to the hospital because of suspected seizure vs syncopal event. Pt was in restaurant felt light headed and slid himself to the floor. Bystanders did say they suspected shaking and seizure type of activity. Pt was hypotensive on initial evaluation by EMS. Currently close to baseline   - No tongue biting or urinary incontinence - given the presentation and hypotension I suspect this is more of convulsive syncope rather then seizures - EEG today and likely d/c  - hydration which he is on  - don't think there is a need for further imaging at this time [facility] with chronic infract and no acute abnormality - no anti epileptic at this time  06/01/2020, 11:33 AM

## 2020-06-01 NOTE — Progress Notes (Addendum)
Progress Note    [name]  JIR:[id number] DOB: [birth year]  DOA: 05/31/2020 PCP: Crissie Figures, PA-C      Brief Narrative:    Medical records reviewed and are as summarized below:  [name] is a 59 y.o. male with medical history significant for stroke, CAD s/p CABG, remote history of seizure (20 years ago), hypertension who was brought to the hospital because of suspected seizure.  Patient said that he was placed in the [facility] he felt like "I was going down".  He said he went to sit down after placing his order.  While he was sitting down, he noticed that he was falling but he caught the table and broke his fall.  He gradually lowered himself to the ground.  He said he started sweating afterwards.  He said he did not lose consciousness or pass out and he remembers everything that happened.  However, according to chart review, patient reportedly fell to the ground and had generalized shaking activity that was witnessed by [employer].  Apparently, he was confused when EMS arrived but his mental status gradually improved.  Reportedly, he was hypotensive and was given 500 cc of IV fluid bolus during transport to the emergency room.      Assessment/Plan:   Principal Problem:   Syncope and collapse Active Problems:   Coronary artery disease involving coronary bypass graft of native heart without angina pectoris   AKI (acute kidney injury) (HCC)   Hypokalemia  S/p syncope: Suspect her seizure in the differential diagnosis but felt to be less likely.  Patient probably had convulsive syncope.  EEG is pending.  No antiepileptics for now per neurologist.  Discontinue IV Keppra.  Follow-up with neurologist.  PT evaluation.  Acute kidney injury: Creatinine is slowly trending down.  Last creatinine on electronic medical record was 0.69 in 2017.  Continue IV fluids and monitor BMP.  Hold lisinopril and HCTZ  Hypotension: BP has improved.  Hold  antihypertensives.  Hypovolemic hyponatremia: Sodium is trending up.  Continue IV fluids and monitor BMP  Aortic valve stenosis: 2D echo showed estimated EF 50 to 55%, normal LV diastolic parameters.  Aortic valve area measures 1.82 cm.  Hypokalemia: Improved.  Monitor potassium level  History of stroke and CAD s/p CABG: Continue aspirin and pravastatin  Alcohol use disorder: Patient has been counseled to quit drinking alcohol completely.  He said he stopped drinking alcohol about 4 days prior to admission.   Body mass index is 32.62 kg/m.   Family Communication/Anticipated D/C date and plan/Code Status   DVT prophylaxis: enoxaparin (LOVENOX) injection 40 mg Start: 05/31/20 1800   Code Status: Full code Family Communication: Plan discussed with patient Disposition Plan:    Status is: Observation  The patient will require care spanning > 2 midnights and should be moved to inpatient because: IV treatments appropriate due to intensity of illness or inability to take PO  Dispo: The patient is from: Home              Anticipated d/c is to: Home              Anticipated d/c date is: 1 day              Patient currently is not medically stable to d/c.           Subjective:   Overnight events noted.  Patient was hypotensive last night.  No headache, dizziness, palpitations, shortness of breath.  Objective:  Vitals:   05/31/20 2345 06/01/20 0357 06/01/20 0500 06/01/20 0748  BP: (!) 85/52 99/66  109/68  Pulse: 80 68    Resp: 18 16  17   Temp: 98.1 F (36.7 C) 98.3 F (36.8 C)  98.5 F (36.9 C)  TempSrc:    Oral  SpO2: 97% 98%  97%  Weight:   109.1 kg   Height:       No data found.   Intake/Output Summary (Last 24 hours) at 06/01/2020 1253 Last data filed at 06/01/2020 0945 Gross per 24 hour  Intake 798.58 ml  Output 150 ml  Net 648.58 ml   Filed Weights   05/31/20 1539 06/01/20 0500  Weight: 108.9 kg 109.1 kg    Exam:  GEN: NAD SKIN: No  rash EYES: EOMI ENT: MMM CV: RRR.  Aortic systolic murmur present on admission. PULM: CTA B ABD: soft, obese, NT, +BS CNS: AAO x 3, non focal EXT: No edema or tenderness   Data Reviewed:   I have personally reviewed following labs and imaging studies:  Labs: Labs show the following:   Basic Metabolic Panel: Recent Labs  Lab 05/31/20 1522 06/01/20 0458  NA 133* 134*  K 3.2* 3.9  CL 94* 100  CO2 24 25  GLUCOSE 143* 103*  BUN 32* 39*  CREATININE 2.20* 1.98*  CALCIUM 8.8* 8.4*  MG 1.9  --   PHOS 3.9  --    GFR Estimated Creatinine Clearance: 50 mL/min (A) (by C-G formula based on SCr of 1.98 mg/dL (H)). Liver Function Tests: Recent Labs  Lab 05/31/20 1522 06/01/20 0458  AST 117* 78*  ALT 96* 78*  ALKPHOS 52 40  BILITOT 2.2* 1.3*  PROT 7.9 6.6  ALBUMIN 4.0 3.2*   No results for input(s): LIPASE, AMYLASE in the last 168 hours. Recent Labs  Lab 05/31/20 1634  AMMONIA <9*   Coagulation profile Recent Labs  Lab 05/31/20 1634  INR 1.1    CBC: Recent Labs  Lab 05/31/20 1522 06/01/20 0458  WBC 11.6* 9.1  NEUTROABS 8.7*  --   HGB 12.3* 11.5*  HCT 35.7* 33.2*  MCV 87.5 89.5  PLT 250 213   Cardiac Enzymes: No results for input(s): CKTOTAL, CKMB, CKMBINDEX, TROPONINI in the last 168 hours. BNP (last 3 results) No results for input(s): PROBNP in the last 8760 hours. CBG: No results for input(s): GLUCAP in the last 168 hours. D-Dimer: No results for input(s): DDIMER in the last 72 hours. Hgb A1c: No results for input(s): HGBA1C in the last 72 hours. Lipid Profile: No results for input(s): CHOL, HDL, LDLCALC, TRIG, CHOLHDL, LDLDIRECT in the last 72 hours. Thyroid function studies: No results for input(s): TSH, T4TOTAL, T3FREE, THYROIDAB in the last 72 hours.  Invalid input(s): FREET3 Anemia work up: No results for input(s): VITAMINB12, FOLATE, FERRITIN, TIBC, IRON, RETICCTPCT in the last 72 hours. Sepsis Labs: Recent Labs  Lab 05/31/20 1522  06/01/20 0458  WBC 11.6* 9.1    Microbiology Recent Results (from the past 240 hour(s))  SARS Coronavirus 2 by RT PCR (hospital order, performed in [facility] hospital lab) Nasopharyngeal Nasopharyngeal Swab     Status: None   Collection Time: 05/31/20  5:11 PM   Specimen: Nasopharyngeal Swab  Result Value Ref Range Status   SARS Coronavirus 2 NEGATIVE NEGATIVE Final    Comment: (NOTE) SARS-CoV-2 target nucleic acids are NOT DETECTED.  The SARS-CoV-2 RNA is generally detectable in upper and lower respiratory specimens during the acute phase of infection. The  lowest concentration of SARS-CoV-2 viral copies this assay can detect is 250 copies / mL. A negative result does not preclude SARS-CoV-2 infection and should not be used as the sole basis for treatment or other patient management decisions.  A negative result may occur with improper specimen collection / handling, submission of specimen other than nasopharyngeal swab, presence of viral mutation(s) within the areas targeted by this assay, and inadequate number of viral copies (<250 copies / mL). A negative result must be combined with clinical observations, patient history, and epidemiological information.  Fact Sheet for Patients:   StrictlyIdeas.no  Fact Sheet for Healthcare Providers: BankingDealers.co.za  This test is not yet approved or  cleared by the [place] FDA and has been authorized for detection and/or diagnosis of SARS-CoV-2 by FDA under an Emergency Use Authorization (EUA).  This EUA will remain in effect (meaning this test can be used) for the duration of the COVID-19 declaration under Section 564(b)(1) of the Act, 21 U.S.C. section 360bbb-3(b)(1), unless the authorization is terminated or revoked sooner.  Performed at [facility], [address], [place], [place] [address]     Procedures and diagnostic studies:  CT Head Wo  Contrast  Result Date: 05/31/2020 CLINICAL DATA:  Seizure, nontraumatic. MR head without contrast 12/27/2013 pre syncopal episode. Possible seizure activity. Personal history of seizures. EXAM: CT HEAD WITHOUT CONTRAST TECHNIQUE: Contiguous axial images were obtained from the base of the skull through the vertex without intravenous contrast. COMPARISON:  None. FINDINGS: Brain: No acute infarct, hemorrhage, or mass lesion is present. Remote right frontal lobe MCA territory infarct is stable. Basal ganglia are intact. Insular ribbon is normal. The ventricles are of normal size. No significant extraaxial fluid collection is present. Insert normal brainstem. Vascular: Atherosclerotic changes are present within the cavernous internal carotid arteries bilaterally and at the dural margin of the left vertebral artery. No hyperdense vessel is present. Skull: Calvarium is intact. No focal lytic or blastic lesions are present. No significant extracranial soft tissue lesion is present. Sinuses/Orbits: The paranasal sinuses and mastoid air cells are clear. The globes and orbits are within normal limits. IMPRESSION: 1. Stable remote right frontal lobe MCA territory infarct. 2. No acute intracranial abnormality or significant interval change. No other acute or focal abnormality to explain seizures. Electronically Signed   By: San Morelle M.D.   On: 05/31/2020 16:39   ECHOCARDIOGRAM COMPLETE  Result Date: 06/01/2020    ECHOCARDIOGRAM REPORT   Patient Name:   [name] Date of Exam: 06/01/2020 Medical Rec #:  [id number]    Height:       72.0 in Accession #:    [id number]   Weight:       240.5 lb Date of Birth:  07/02/1961     BSA:          2.304 m Patient Age:    37 years     BP:           109/68 mmHg Patient Gender: M            HR:           75 bpm. Exam Location:  [facility] Procedure: 2D Echo, Color Doppler and Cardiac Doppler Indications:     R55 Syncope  History:         Patient has prior history of Echocardiogram  examinations.                  Previous Myocardial Infarction, Prior CABG, Stroke; Risk  Factors:Hypertension.  Sonographer:     Charmayne Sheer RDCS (AE) Referring Phys:  [place] Diagnosing Phys: Bartholome Bill MD  Sonographer Comments: Technically difficult study due to poor echo windows. IMPRESSIONS  1. Left ventricular ejection fraction, by estimation, is 50 to 55%. Left ventricular ejection fraction by PLAX is 51 %. The left ventricle has low normal function. The left ventricle has no regional wall motion abnormalities. Left ventricular diastolic parameters were normal.  2. Right ventricular systolic function is normal. The right ventricular size is normal.  3. The mitral valve was not well visualized. Trivial mitral valve regurgitation.  4. The aortic valve was not well visualized. Aortic valve regurgitation is not visualized. FINDINGS  Left Ventricle: Left ventricular ejection fraction, by estimation, is 50 to 55%. Left ventricular ejection fraction by PLAX is 51 %. The left ventricle has low normal function. The left ventricle has no regional wall motion abnormalities. The left ventricular internal cavity size was normal in size. There is no left ventricular hypertrophy. Left ventricular diastolic parameters were normal. Right Ventricle: The right ventricular size is normal. No increase in right ventricular wall thickness. Right ventricular systolic function is normal. Left Atrium: Left atrial size was normal in size. Right Atrium: Right atrial size was normal in size. Pericardium: There is no evidence of pericardial effusion. Mitral Valve: The mitral valve was not well visualized. Trivial mitral valve regurgitation. MV peak gradient, 7.0 mmHg. The mean mitral valve gradient is 3.0 mmHg. Tricuspid Valve: The tricuspid valve is not well visualized. Tricuspid valve regurgitation is trivial. Aortic Valve: The aortic valve was not well visualized. Aortic valve regurgitation is not  visualized. Aortic valve mean gradient measures 14.0 mmHg. Aortic valve peak gradient measures 25.8 mmHg. Aortic valve area, by VTI measures 1.82 cm. Pulmonic Valve: The pulmonic valve was not well visualized. Pulmonic valve regurgitation is trivial. Aorta: The aortic root is normal in size and structure. IAS/Shunts: The interatrial septum was not assessed.  LEFT VENTRICLE PLAX 2D LV EF:         Left            Diastology                ventricular     LV e' lateral:   9.14 cm/s                ejection        LV E/e' lateral: 13.2                fraction by     LV e' medial:    7.18 cm/s                PLAX is 51      LV E/e' medial:  16.9                %. LVIDd:         5.38 cm LVIDs:         3.98 cm LV PW:         1.02 cm LV IVS:        0.91 cm LVOT diam:     2.20 cm LV SV:         83 LV SV Index:   36 LVOT Area:     3.80 cm  RIGHT VENTRICLE RV Basal diam:  3.94 cm LEFT ATRIUM             Index       RIGHT  ATRIUM           Index LA diam:        3.70 cm 1.61 cm/m  RA Area:     15.40 cm LA Vol (A2C):   19.6 ml 8.51 ml/m  RA Volume:   38.30 ml  16.62 ml/m LA Vol (A4C):   26.3 ml 11.41 ml/m LA Biplane Vol: 23.2 ml 10.07 ml/m  AORTIC VALVE                    PULMONIC VALVE AV Area (Vmax):    1.63 cm     PV Vmax:       1.28 m/s AV Area (Vmean):   1.58 cm     PV Vmean:      72.000 cm/s AV Area (VTI):     1.82 cm     PV VTI:        0.195 m AV Vmax:           254.00 cm/s  PV Peak grad:  6.6 mmHg AV Vmean:          175.000 cm/s PV Mean grad:  3.0 mmHg AV VTI:            0.455 m AV Peak Grad:      25.8 mmHg AV Mean Grad:      14.0 mmHg LVOT Vmax:         109.00 cm/s LVOT Vmean:        72.900 cm/s LVOT VTI:          0.218 m LVOT/AV VTI ratio: 0.48  AORTA Ao Root diam: 3.20 cm MITRAL VALVE MV Area (PHT): 3.65 cm     SHUNTS MV Peak grad:  7.0 mmHg     Systemic VTI:  0.22 m MV Mean grad:  3.0 mmHg     Systemic Diam: 2.20 cm MV Vmax:       1.32 m/s MV Vmean:      84.4 cm/s MV Decel Time: 208 msec MV E velocity:  121.00 cm/s MV A velocity: 124.00 cm/s MV E/A ratio:  0.98 Bartholome Bill MD Electronically signed by Bartholome Bill MD Signature Date/Time: 06/01/2020/12:30:14 PM    Final     Medications:   . aspirin EC  81 mg Oral Daily  . buPROPion  150 mg Oral Daily  . enoxaparin (LOVENOX) injection  40 mg Subcutaneous Q24H  . pravastatin  20 mg Oral Daily  . QUEtiapine  25 mg Oral QHS   Continuous Infusions: . lactated ringers with kcl 75 mL/hr at 06/01/20 0500  . levETIRAcetam 500 mg (06/01/20 0607)     LOS: 0 days   Sidra Oldfield  [facility]     06/01/2020, 12:53 PM

## 2020-06-02 DIAGNOSIS — F1010 Alcohol abuse, uncomplicated: Secondary | ICD-10-CM

## 2020-06-02 NOTE — Discharge Summary (Signed)
[facility] NAME: [name]    MR#:  [id number]  DATE OF BIRTH:  1961-07-02  DATE OF ADMISSION:  05/31/2020 ADMITTING PHYSICIAN: Jennye Boroughs, MD  DATE OF DISCHARGE: 06/02/2020  PRIMARY CARE PHYSICIAN: Crissie Figures, PA-C    ADMISSION DIAGNOSIS:  Alcohol abuse [F10.10] Seizure ([place]) [R56.9] AKI (acute kidney injury) ([place]) [N17.9]  DISCHARGE DIAGNOSIS:  Syncope suspected due to Dehydration improving Acute renal failure--appears pre-renal azotemia  SECONDARY DIAGNOSIS:   Past Medical History:  Diagnosis Date  . CVA (cerebral infarction)   . Headache    mild, since stroke 2012  . Hypertension   . MI (myocardial infarction) ([place]) 05/09/2012  . Reading difficulty    pt reports he reads at "about a second grade level"  . Stroke [facility]) 2012   numbness to left hand   . Wears dentures    full upper and lower    HOSPITAL COURSE:   [name] is a 59 y.o. male with medical history significant for stroke, CADs/p CABG, remote history of seizure (20 years ago), hypertension who was brought to the hospital because of suspected seizure. Patient said that he was placed in the [facility] he felt like "I was going down". He said hewent to sit down after placing his order.  Syncope:suspected due to AKI-pre-renal -creat baseline in 2017 0.92 -came in with creat 2.1--IVF--1.9 -feels better -d/c HCTZ - Suspect  seizure in the differential diagnosis but felt to be less likely.  -Patient probably had convulsive syncope--per Neurology assessment. -  EEG is negative.  No antiepileptics for now per neurologist.   - PT evaluation--no new PT needs, did very well  Acute kidney injury: Creatinine is slowly trending down.  Last creatinine on electronic medical record was [phone].  Continue IV fluids and monitor BMP. Hold lisinopril and HCTZ -d/ced HCTZ and decreased lisinopril to 10 mg with holding parameter (was on 40  mg)  Hypotension: BP has improved.  Hold antihypertensives. BP meds adjusted  Hypovolemic hyponatremia: Sodium is trending up.   -recieved IV fluids.  Aortic valve stenosis: 2D echo showed estimated EF 50 to 55%, normal LV diastolic parameters.  Aortic valve area measures 1.82 cm. -follows with Dr Nehemiah Massed  Hypokalemia: Improved.  Monitor potassium level  History of stroke and CADs/p CABG: Continue aspirin and pravastatin  Alcohol use disorder with mild elevated LFT's: Patient has been counseled to quit drinking alcohol completely.  He said he stopped drinking alcohol about 4 days prior to admission.  H/o Dementia--cont home meds   Body mass index is 32.62 kg/m.  Overall seems to be improving D/c home. Pt agreeable CONSULTS OBTAINED:  Treatment Team:  Leotis Pain, MD  DRUG ALLERGIES:  No Known Allergies  DISCHARGE MEDICATIONS:   Allergies as of 06/02/2020   No Known Allergies     Medication List    STOP taking these medications   hydrochlorothiazide 12.5 MG capsule Commonly known as: MICROZIDE   lisinopril 40 MG tablet Commonly known as: ZESTRIL     TAKE these medications   aspirin 81 MG EC tablet Take 1 tablet (81 mg total) by mouth daily.   donepezil 10 MG tablet Commonly known as: ARICEPT Take 10 mg by mouth daily.   fluticasone 44 MCG/ACT inhaler Commonly known as: FLOVENT HFA Inhale 2 puffs into the lungs 2 (two) times daily.   metoprolol tartrate 25 MG tablet Commonly known as: LOPRESSOR Take 25 mg by mouth daily.  omeprazole 20 MG capsule Commonly known as: PRILOSEC Take 20 mg by mouth daily.   pravastatin 20 MG tablet Commonly known as: PRAVACHOL Take 20 mg by mouth at bedtime.   Proventil HFA 108 (90 Base) MCG/ACT inhaler Generic drug: albuterol Inhale 2 puffs into the lungs every 6 (six) hours as needed for wheezing or shortness of breath.   QUEtiapine 50 MG tablet Commonly known as: SEROQUEL Take 50 mg by mouth  at bedtime.   tamsulosin 0.4 MG Caps capsule Commonly known as: FLOMAX Take 1 capsule (0.4 mg total) by mouth daily.       If you experience worsening of your admission symptoms, develop shortness of breath, life threatening emergency, suicidal or homicidal thoughts you must seek medical attention immediately by calling 911 or calling your MD immediately  if symptoms less severe.  You Must read complete instructions/literature along with all the possible adverse reactions/side effects for all the Medicines you take and that have been prescribed to you. Take any new Medicines after you have completely understood and accept all the possible adverse reactions/side effects.   Please note  You were cared for by a hospitalist during your hospital stay. If you have any questions about your discharge medications or the care you received while you were in the hospital after you are discharged, you can call the unit and asked to speak with the hospitalist on call if the hospitalist that took care of you is not available. Once you are discharged, your primary care physician will handle any further medical issues. Please note that NO REFILLS for any discharge medications will be authorized once you are discharged, as it is imperative that you return to your primary care physician (or establish a relationship with a primary care physician if you do not have one) for your aftercare needs so that they can reassess your need for medications and monitor your lab values. Today   SUBJECTIVE   No new complaints Overall feels better worked with PT today  VITAL SIGNS:  Blood pressure 112/63, pulse 85, temperature 97.7 F (36.5 C), temperature source Oral, resp. rate 19, height 6' (1.829 m), weight 109.1 kg, SpO2 97 %.  I/O:    Intake/Output Summary (Last 24 hours) at 06/02/2020 1056 Last data filed at 06/02/2020 0650 Gross per 24 hour  Intake 878.41 ml  Output 950 ml  Net -71.59 ml    PHYSICAL  EXAMINATION:  GENERAL:  59 y.o.-year-old patient lying in the bed with no acute distress.  EYES: Pupils equal, round, reactive to light and accommodation. No scleral icterus.  HEENT: Head atraumatic, normocephalic. Oropharynx and nasopharynx clear.  NECK:  Supple, no jugular venous distention. No thyroid enlargement, no tenderness.  LUNGS: Normal breath sounds bilaterally, no wheezing, rales,rhonchi or crepitation. No use of accessory muscles of respiration.  CARDIOVASCULAR: S1, S2 normal. No murmurs, rubs, or gallops.  ABDOMEN: Soft, non-tender, non-distended. Bowel sounds present. No organomegaly or mass.  EXTREMITIES: No pedal edema, cyanosis, or clubbing.  NEUROLOGIC: Cranial nerves II through XII are intact. Muscle strength 5/5 in all extremities. Sensation intact. Gait not checked.  PSYCHIATRIC: The patient is alert and oriented x 3.  SKIN: No obvious rash, lesion, or ulcer.   DATA REVIEW:   CBC  Recent Labs  Lab 06/01/20 0458  WBC 9.1  HGB 11.5*  HCT 33.2*  PLT 213    Chemistries  Recent Labs  Lab 05/31/20 1522 05/31/20 1522 06/01/20 0458  NA 133*   < > 134*  K  3.2*   < > 3.9  CL 94*   < > 100  CO2 24   < > 25  GLUCOSE 143*   < > 103*  BUN 32*   < > 39*  CREATININE 2.20*   < > 1.98*  CALCIUM 8.8*   < > 8.4*  MG 1.9  --   --   AST 117*   < > 78*  ALT 96*   < > 78*  ALKPHOS 52   < > 40  BILITOT 2.2*   < > 1.3*   < > = values in this interval not displayed.    Microbiology Results   Recent Results (from the past 240 hour(s))  SARS Coronavirus 2 by RT PCR (hospital order, performed in [facility] hospital lab) Nasopharyngeal Nasopharyngeal Swab     Status: None   Collection Time: 05/31/20  5:11 PM   Specimen: Nasopharyngeal Swab  Result Value Ref Range Status   SARS Coronavirus 2 NEGATIVE NEGATIVE Final    Comment: (NOTE) SARS-CoV-2 target nucleic acids are NOT DETECTED.  The SARS-CoV-2 RNA is generally detectable in upper and lower respiratory specimens  during the acute phase of infection. The lowest concentration of SARS-CoV-2 viral copies this assay can detect is 250 copies / mL. A negative result does not preclude SARS-CoV-2 infection and should not be used as the sole basis for treatment or other patient management decisions.  A negative result may occur with improper specimen collection / handling, submission of specimen other than nasopharyngeal swab, presence of viral mutation(s) within the areas targeted by this assay, and inadequate number of viral copies (<250 copies / mL). A negative result must be combined with clinical observations, patient history, and epidemiological information.  Fact Sheet for Patients:   StrictlyIdeas.no  Fact Sheet for Healthcare Providers: BankingDealers.co.za  This test is not yet approved or  cleared by the [place] FDA and has been authorized for detection and/or diagnosis of SARS-CoV-2 by FDA under an Emergency Use Authorization (EUA).  This EUA will remain in effect (meaning this test can be used) for the duration of the COVID-19 declaration under Section 564(b)(1) of the Act, 21 U.S.C. section 360bbb-3(b)(1), unless the authorization is terminated or revoked sooner.  Performed at [facility], [address] [place], [place] [address]     RADIOLOGY:  EEG  Result Date: 06/01/2020 Lora Havens, MD     06/01/2020  1:54 PM Patient Name: [name] MRN: [id number] Epilepsy Attending: Lora Havens Referring Physician/Provider: Dr Jennye Boroughs Date: 06/01/2020 Duration: 23.34 mins Patient history: 59 y.o. male malewith medical history significant for stroke, CADs/p CABG, remote history of seizure (20 years ago) not on anti epileptics, hypertension who was brought to the hospital because of suspected seizure vs syncopal event.  EEG to evaluate for seizures Level of alertness: Awake, drowsy, sleep, comatose, lethargic AEDs  during EEG study: None Technical aspects: This EEG study was done with scalp electrodes positioned according to the 10-20 International system of electrode placement. Electrical activity was acquired at a sampling rate of 500Hz  and reviewed with a high frequency filter of 70Hz  and a low frequency filter of 1Hz . EEG data were recorded continuously and digitally stored. Description: The posterior dominant rhythm consists of 8 Hz activity of moderate voltage (25-35 uV) seen predominantly in posterior head regions, symmetric and reactive to eye opening and eye closing. Physiology photic driving was not seen during photic stimulation.  Hyperventilation was not performed.   IMPRESSION: This  study is within normal limits. No seizures or epileptiform discharges were seen throughout the recording. Lora Havens   CT Head Wo Contrast  Result Date: 05/31/2020 CLINICAL DATA:  Seizure, nontraumatic. MR head without contrast 12/27/2013 pre syncopal episode. Possible seizure activity. Personal history of seizures. EXAM: CT HEAD WITHOUT CONTRAST TECHNIQUE: Contiguous axial images were obtained from the base of the skull through the vertex without intravenous contrast. COMPARISON:  None. FINDINGS: Brain: No acute infarct, hemorrhage, or mass lesion is present. Remote right frontal lobe MCA territory infarct is stable. Basal ganglia are intact. Insular ribbon is normal. The ventricles are of normal size. No significant extraaxial fluid collection is present. Insert normal brainstem. Vascular: Atherosclerotic changes are present within the cavernous internal carotid arteries bilaterally and at the dural margin of the left vertebral artery. No hyperdense vessel is present. Skull: Calvarium is intact. No focal lytic or blastic lesions are present. No significant extracranial soft tissue lesion is present. Sinuses/Orbits: The paranasal sinuses and mastoid air cells are clear. The globes and orbits are within normal limits.  IMPRESSION: 1. Stable remote right frontal lobe MCA territory infarct. 2. No acute intracranial abnormality or significant interval change. No other acute or focal abnormality to explain seizures. Electronically Signed   By: San Morelle M.D.   On: 05/31/2020 16:39   ECHOCARDIOGRAM COMPLETE  Result Date: 06/01/2020    ECHOCARDIOGRAM REPORT   Patient Name:   Armany Merrick Date of Exam: 06/01/2020 Medical Rec #:  [id number]    Height:       72.0 in Accession #:    [id number]   Weight:       240.5 lb Date of Birth:  [birth year]     BSA:          2.304 m Patient Age:    22 years     BP:           109/68 mmHg Patient Gender: M            HR:           75 bpm. Exam Location:  [facility] Procedure: 2D Echo, Color Doppler and Cardiac Doppler Indications:     R55 Syncope  History:         Patient has prior history of Echocardiogram examinations.                  Previous Myocardial Infarction, Prior CABG, Stroke; Risk                  Factors:Hypertension.  Sonographer:     Charmayne Sheer RDCS (AE) Referring Phys:  [place] Diagnosing Phys: Bartholome Bill MD  Sonographer Comments: Technically difficult study due to poor echo windows. IMPRESSIONS  1. Left ventricular ejection fraction, by estimation, is 50 to 55%. Left ventricular ejection fraction by PLAX is 51 %. The left ventricle has low normal function. The left ventricle has no regional wall motion abnormalities. Left ventricular diastolic parameters were normal.  2. Right ventricular systolic function is normal. The right ventricular size is normal.  3. The mitral valve was not well visualized. Trivial mitral valve regurgitation.  4. The aortic valve was not well visualized. Aortic valve regurgitation is not visualized. FINDINGS  Left Ventricle: Left ventricular ejection fraction, by estimation, is 50 to 55%. Left ventricular ejection fraction by PLAX is 51 %. The left ventricle has low normal function. The left ventricle has no regional wall motion  abnormalities. The left ventricular internal cavity size was  normal in size. There is no left ventricular hypertrophy. Left ventricular diastolic parameters were normal. Right Ventricle: The right ventricular size is normal. No increase in right ventricular wall thickness. Right ventricular systolic function is normal. Left Atrium: Left atrial size was normal in size. Right Atrium: Right atrial size was normal in size. Pericardium: There is no evidence of pericardial effusion. Mitral Valve: The mitral valve was not well visualized. Trivial mitral valve regurgitation. MV peak gradient, 7.0 mmHg. The mean mitral valve gradient is 3.0 mmHg. Tricuspid Valve: The tricuspid valve is not well visualized. Tricuspid valve regurgitation is trivial. Aortic Valve: The aortic valve was not well visualized. Aortic valve regurgitation is not visualized. Aortic valve mean gradient measures 14.0 mmHg. Aortic valve peak gradient measures 25.8 mmHg. Aortic valve area, by VTI measures 1.82 cm. Pulmonic Valve: The pulmonic valve was not well visualized. Pulmonic valve regurgitation is trivial. Aorta: The aortic root is normal in size and structure. IAS/Shunts: The interatrial septum was not assessed.  LEFT VENTRICLE PLAX 2D LV EF:         Left            Diastology                ventricular     LV e' lateral:   9.14 cm/s                ejection        LV E/e' lateral: 13.2                fraction by     LV e' medial:    7.18 cm/s                PLAX is 51      LV E/e' medial:  16.9                %. LVIDd:         5.38 cm LVIDs:         3.98 cm LV PW:         1.02 cm LV IVS:        0.91 cm LVOT diam:     2.20 cm LV SV:         83 LV SV Index:   36 LVOT Area:     3.80 cm  RIGHT VENTRICLE RV Basal diam:  3.94 cm LEFT ATRIUM             Index       RIGHT ATRIUM           Index LA diam:        3.70 cm 1.61 cm/m  RA Area:     15.40 cm LA Vol (A2C):   19.6 ml 8.51 ml/m  RA Volume:   38.30 ml  16.62 ml/m LA Vol (A4C):   26.3 ml 11.41  ml/m LA Biplane Vol: 23.2 ml 10.07 ml/m  AORTIC VALVE                    PULMONIC VALVE AV Area (Vmax):    1.63 cm     PV Vmax:       1.28 m/s AV Area (Vmean):   1.58 cm     PV Vmean:      72.000 cm/s AV Area (VTI):     1.82 cm     PV VTI:        0.195 m AV Vmax:  254.00 cm/s  PV Peak grad:  6.6 mmHg AV Vmean:          175.000 cm/s PV Mean grad:  3.0 mmHg AV VTI:            0.455 m AV Peak Grad:      25.8 mmHg AV Mean Grad:      14.0 mmHg LVOT Vmax:         109.00 cm/s LVOT Vmean:        72.900 cm/s LVOT VTI:          0.218 m LVOT/AV VTI ratio: 0.48  AORTA Ao Root diam: 3.20 cm MITRAL VALVE MV Area (PHT): 3.65 cm     SHUNTS MV Peak grad:  7.0 mmHg     Systemic VTI:  0.22 m MV Mean grad:  3.0 mmHg     Systemic Diam: 2.20 cm MV Vmax:       1.32 m/s MV Vmean:      84.4 cm/s MV Decel Time: 208 msec MV E velocity: 121.00 cm/s MV A velocity: 124.00 cm/s MV E/A ratio:  0.98 Bartholome Bill MD Electronically signed by Bartholome Bill MD Signature Date/Time: 06/01/2020/12:30:14 PM    Final      CODE STATUS:     Code Status Orders  (From admission, onward)         Start     Ordered   05/31/20 1751  Full code  Continuous        05/31/20 1752        Code Status History    Date Active Date Inactive Code Status Order ID Comments User Context   07/24/2016 0751 07/24/2016 1226 Full Code [id number]  Florene Glen, MD ED   Advance Care Planning Activity       TOTAL TIME TAKING CARE OF THIS PATIENT: *40* minutes.    Fritzi Mandes M.D  [facility]    CC: Primary care physician; Crissie Figures, PA-C

## 2020-06-02 NOTE — Discharge Instructions (Signed)
Patient advised to drink adequate fluids.  He is also advised to keep a log of his blood pressure and hold his BP pants if his systolic blood pressure is less than 110.

## 2020-06-02 NOTE — Evaluation (Signed)
Physical Therapy Evaluation Patient Details Name: [name] MRN: [id number] DOB: Feb 20, 1961 Today's Date: 06/02/2020   History of Present Illness  [name] is a 59 y.o. male with medical history significant for stroke, CAD s/p CABG, remote history of seizure (20 years ago), hypertension who was brought to the hospital because of suspected seizure.  Clinical Impression  Patient is alert and oriented, pleasant. Agrees to PT assessment. Reports he is feeling better. No specific complaints. He performed bed mobility with mod independence, transfers with mod independence, ambulated 150 feet without ad and min guard. No lob or significant mobility difficulty, but reports sob with gait. He will continue to benefit from skilled PT while here to improve activity tolerance and strength for safe return home.      Follow Up Recommendations Home health PT    Equipment Recommendations  None recommended by PT    Recommendations for Other Services       Precautions / Restrictions Precautions Precautions: Fall Restrictions Weight Bearing Restrictions: No      Mobility  Bed Mobility Overal bed mobility: Modified Independent                Transfers Overall transfer level: Modified independent                  Ambulation/Gait Ambulation/Gait assistance: Min guard Gait Distance (Feet): 150 Feet   Gait Pattern/deviations: Step-through pattern Gait velocity: WFL   General Gait Details: generally steady, reports sob with ambulation. O2 sats remained > 90% on room air. Reports RPE of 4/10  Stairs            Wheelchair Mobility    Modified Rankin (Stroke Patients Only)       Balance Overall balance assessment: Modified Independent                                           Pertinent Vitals/Pain Pain Assessment: No/denies pain    Home Living Family/patient expects to be discharged to:: Private residence Living Arrangements: Alone   Type  of Home: Apartment Home Access: Level entry     Home Layout: One level Home Equipment: None      Prior Function Level of Independence: Independent         Comments: reports he drives, fully independent PTA     Hand Dominance        Extremity/Trunk Assessment   Upper Extremity Assessment Upper Extremity Assessment: Overall WFL for tasks assessed    Lower Extremity Assessment Lower Extremity Assessment: Overall WFL for tasks assessed    Cervical / Trunk Assessment Cervical / Trunk Assessment: Normal  Communication   Communication: No difficulties  Cognition Arousal/Alertness: Awake/alert Behavior During Therapy: WFL for tasks assessed/performed Overall Cognitive Status: Within Functional Limits for tasks assessed                                        General Comments      Exercises     Assessment/Plan    PT Assessment Patient needs continued PT services  PT Problem List Decreased strength;Decreased mobility;Decreased activity tolerance       PT Treatment Interventions Therapeutic activities;Gait training;Therapeutic exercise;Patient/family education;Stair training;Balance training;Functional mobility training    PT Goals (Current goals can be found in the Care Plan section)  Acute Rehab PT Goals Patient Stated Goal: to return home PT Goal Formulation: With patient Time For Goal Achievement: 06/16/20 Potential to Achieve Goals: Good    Frequency Min 2X/week   Barriers to discharge Decreased caregiver support      Co-evaluation               AM-PAC PT "6 Clicks" Mobility  Outcome Measure Help needed turning from your back to your side while in a flat bed without using bedrails?: None Help needed moving from lying on your back to sitting on the side of a flat bed without using bedrails?: None   Help needed standing up from a chair using your arms (e.g., wheelchair or bedside chair)?: A Little Help needed to walk in  hospital room?: None Help needed climbing 3-5 steps with a railing? : A Lot 6 Click Score: 17    End of Session Equipment Utilized During Treatment: Gait belt Activity Tolerance: Patient tolerated treatment well Patient left: in chair;with call bell/phone within reach;with chair alarm set;Other (comment) (MD present) Nurse Communication: Mobility status PT Visit Diagnosis: Muscle weakness (generalized) (M62.81);Difficulty in walking, not elsewhere classified (R26.2)    Time: 1010-1035 PT Time Calculation (min) (ACUTE ONLY): 25 min   Charges:   PT Evaluation $PT Eval Moderate Complexity: 1 Mod PT Treatments $Gait Training: 8-22 mins        [name], PT, GCS 06/02/20,11:28 AM

## 2020-10-05 DIAGNOSIS — N1831 Chronic kidney disease, stage 3a: Secondary | ICD-10-CM | POA: Insufficient documentation

## 2020-11-08 IMAGING — MR MR HEAD W/O CM
11 series · 42 of 48 positions shown · non-contrast
Comparison: CT HEAD December 27, 2013;

CLINICAL DATA: Follow up stroke.

EXAM:
MRI HEAD WITHOUT CONTRAST
TECHNIQUE: Multiplanar, multiecho pulse sequences of the brain and surrounding
structures were obtained without intravenous contrast.

[Series 5: ax dwi_tracew · axial · 3.0mm · 0.60mm/px · z∈[-30,+131]mm · 4 of 55 slices shown]
[im 1/55]
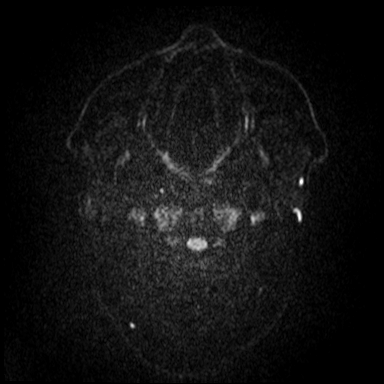
[im 19/55]
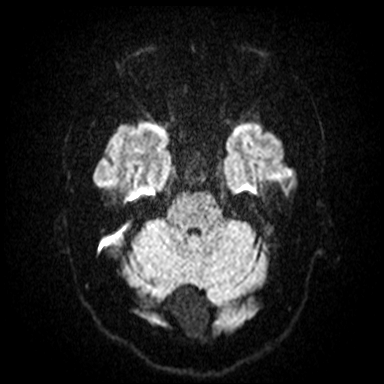
[im 37/55]
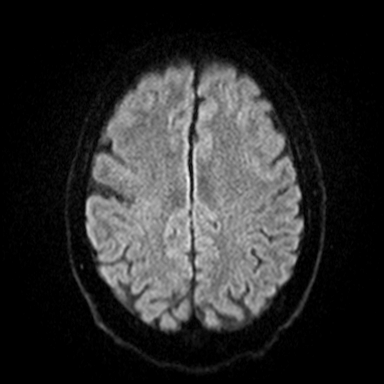
[im 55/55]
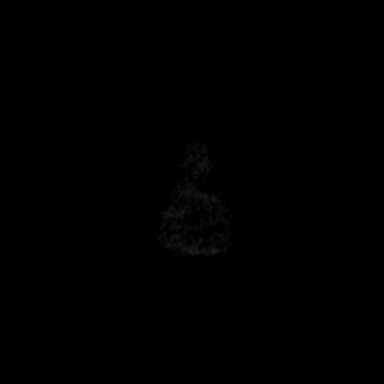

[Series 6: ax dwi_adc · axial · 3.0mm · 0.60mm/px · z∈[-30,+131]mm · 4 of 55 slices shown]
[im 1/55]
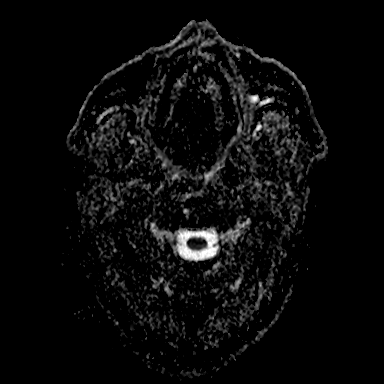
[im 19/55]
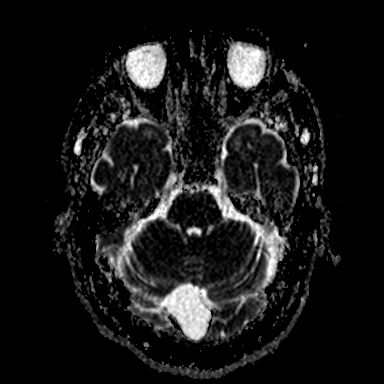
[im 37/55]
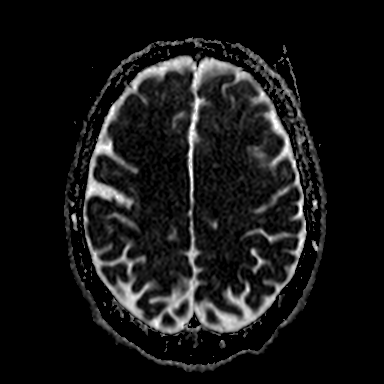
[im 55/55]
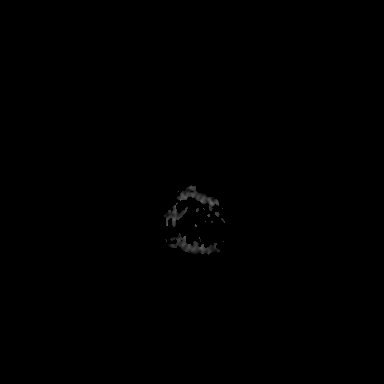

[Series 7: cor dwi_tracew · coronal · 5.0mm · 0.60mm/px · 3 of 45 slices shown]
[im 1/45]
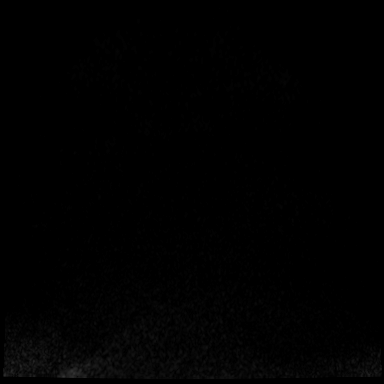
[im 23/45]
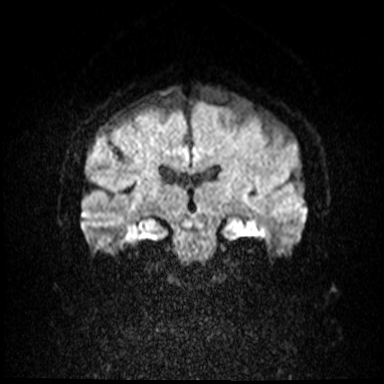
[im 45/45]
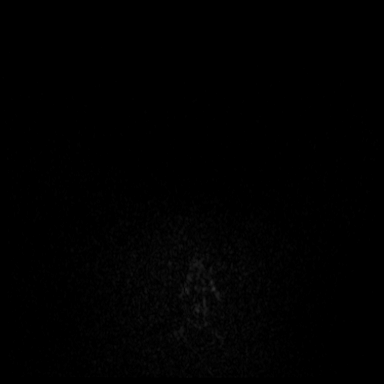

[Series 8: cor dwi_adc · coronal · 5.0mm · 0.60mm/px · 3 of 45 slices shown]
[im 1/45]
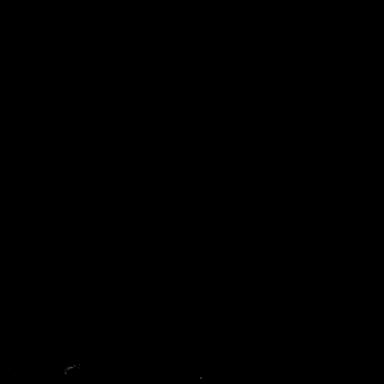
[im 23/45]
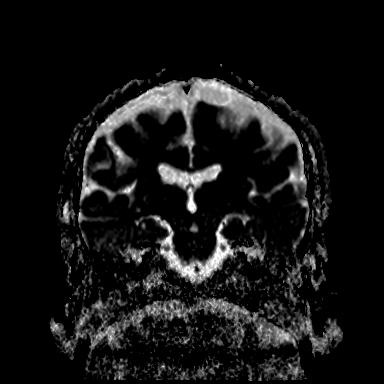
[im 45/45]
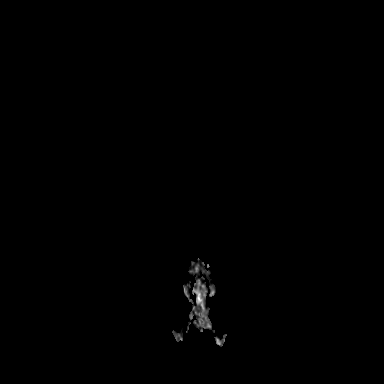

[Series 9: T1 · sagittal · 5.0mm · 0.62mm/px · 2 of 23 slices shown (1 of 2)]
[im 1/23]
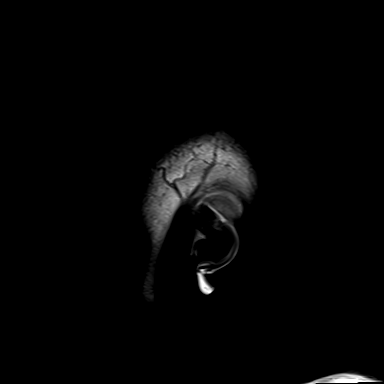
[im 23/23]
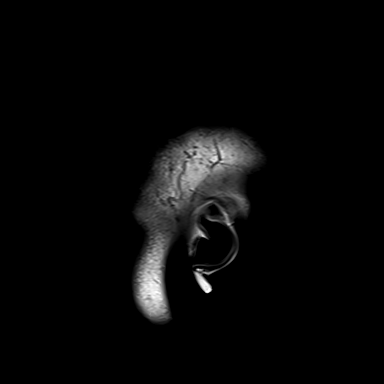

[Series 10: T2 · axial · 5.0mm · 0.53mm/px · z∈[-35,+133]mm · 2 of 29 slices shown (1 of 2)]
[im 1/29]
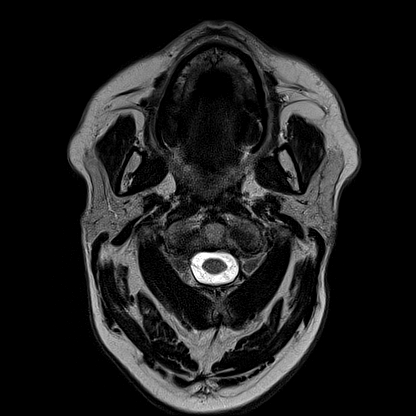
[im 29/29]
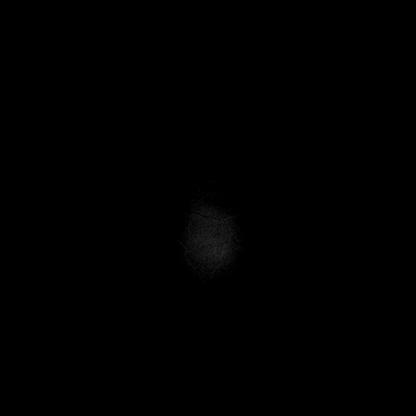

[Series 12: pha_images · axial · 3.0mm · 0.90mm/px · z∈[-39,+137]mm · 5 of 60 slices shown]
[im 1/60]
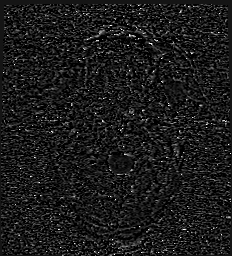
[im 15/60]
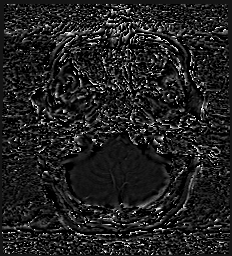
[im 30/60]
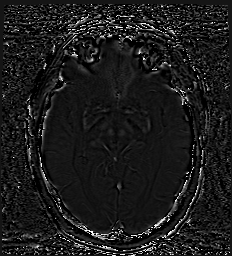
[im 45/60]
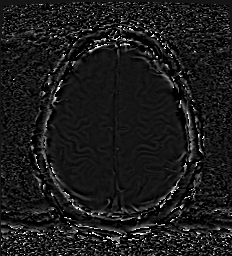
[im 60/60]
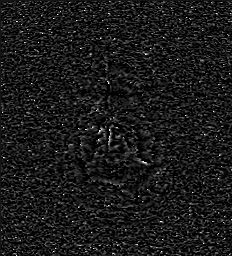

[Series 13: swi_images · axial · 3.0mm · 0.90mm/px · z∈[-39,+93]mm · 4 of 60 slices shown]
[im 1/60]
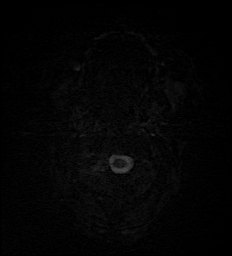
[im 15/60]
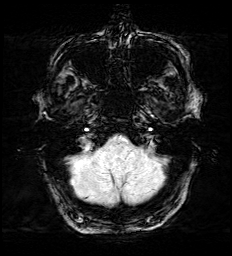
[im 30/60]
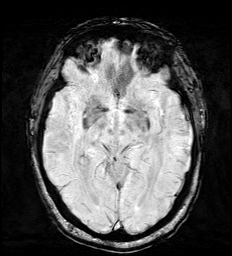
[im 45/60]
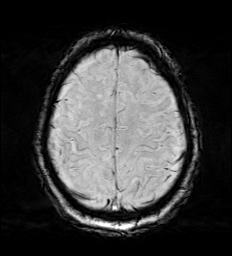

[Series 15: FLAIR · axial · 3.0mm · 0.53mm/px · z∈[-32,+130]mm · 4 of 55 slices shown]
[im 1/55]
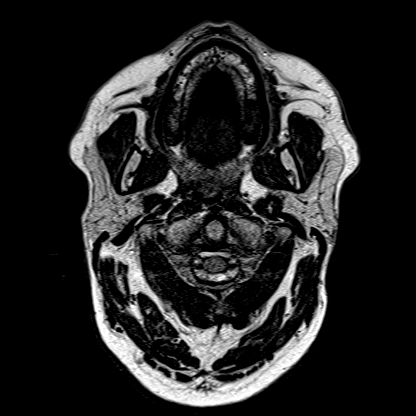
[im 19/55]
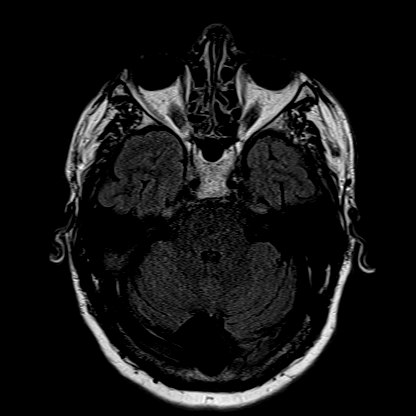
[im 37/55]
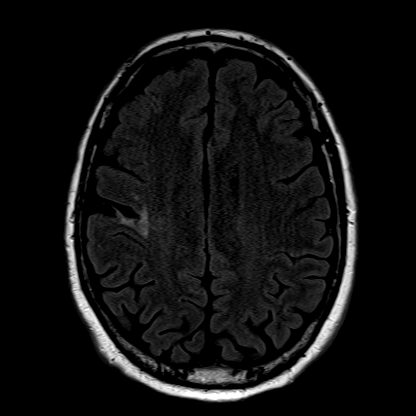
[im 55/55]
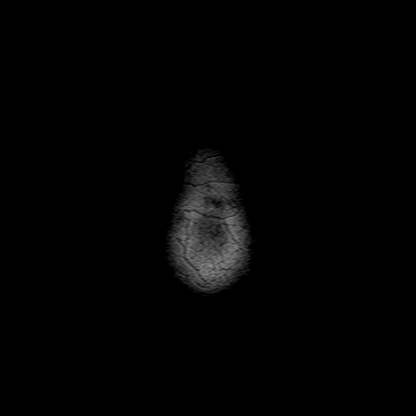

[Series 16: T1 · axial · 1.0mm · 0.98mm/px · z∈[-29,+145]mm · 8 of 173 slices shown (2 of 2)]
[im 1/173]
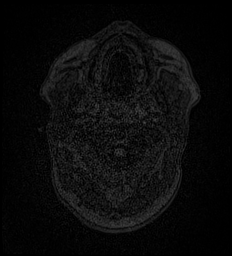
[im 29/173]
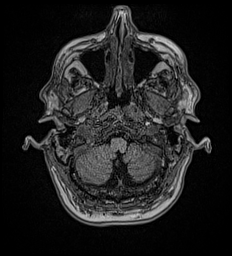
[im 58/173]
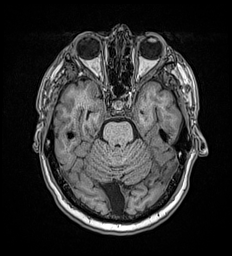
[im 72/173]
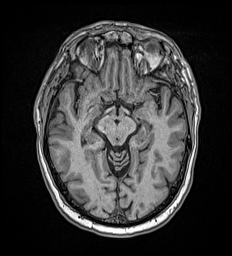
[im 101/173]
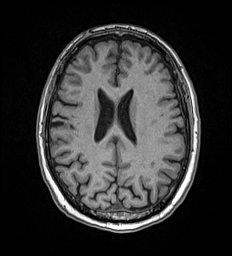
[im 115/173]
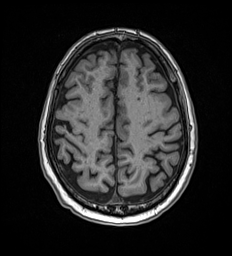
[im 144/173]
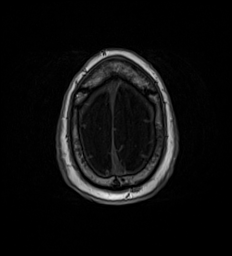
[im 173/173]
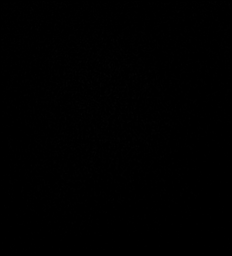

[Series 17: T2 · coronal · 5.0mm · 0.57mm/px · 3 of 33 slices shown (2 of 2)]
[im 1/33]
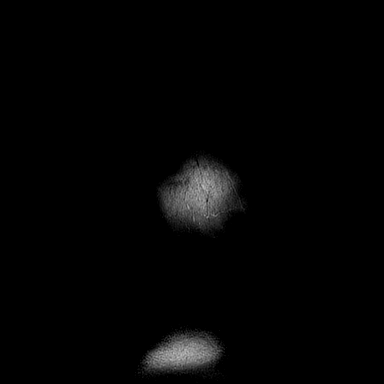
[im 17/33]
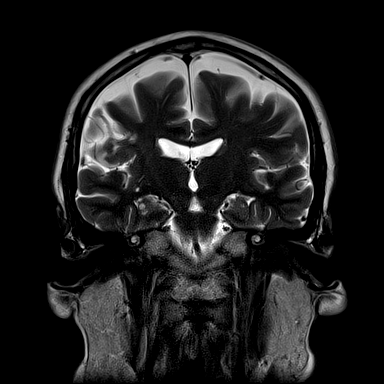
[im 33/33]
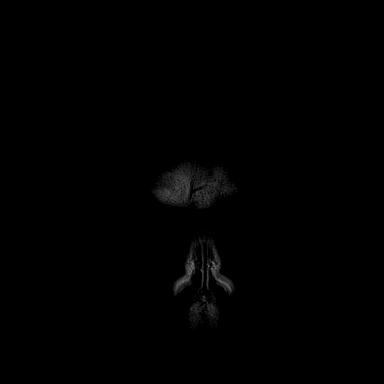

[42 of 48 positions shown; findings below may reference images not displayed]

MRI head report dated
February 02, 1998 though images are not available for direct
comparison.
FINDINGS: INTRACRANIAL CONTENTS: No reduced diffusion to suggest acute
ischemia. No susceptibility artifact to suggest hemorrhage. The
ventricles and sulci are normal for patient's age. Small area RIGHT
frontoparietal encephalomalacia present on prior CT though better
demonstrated on this examination. A few scattered subcentimeter
supratentorial white matter FLAIR T2 hyperintensities. No suspicious
parenchymal signal, masses, mass effect. No abnormal extra-axial
fluid collections. No extra-axial masses. Small mega cisterna magna
versus arachnoid cyst.

VASCULAR: Normal major intracranial vascular flow voids present at
skull base.

SKULL AND UPPER CERVICAL SPINE: No abnormal sellar expansion. No
suspicious calvarial bone marrow signal. Craniocervical junction
maintained.

SINUSES/ORBITS: Trace paranasal sinus mucosal thickening without
air-fluid levels. Mastoid air cells are well aerated.The included
ocular globes and orbital contents are non-suspicious.

OTHER: Patient is edentulous.
IMPRESSION: 1. No acute intracranial process.
2. Old small RIGHT MCA territory infarct.
3. Mild chronic small vessel ischemic changes.

## 2021-06-03 ENCOUNTER — Emergency Department: Payer: Medicare (Managed Care)

## 2021-06-03 ENCOUNTER — Observation Stay
Admission: EM | Admit: 2021-06-03 | Discharge: 2021-06-05 | Disposition: A | Discharge status: Home / Self Care | Payer: Medicare (Managed Care) | Attending: Internal Medicine | Admitting: Internal Medicine

## 2021-06-03 ENCOUNTER — Encounter: Payer: Self-pay | Admitting: Emergency Medicine

## 2021-06-03 DIAGNOSIS — Y900 Blood alcohol level of less than 20 mg/100 ml: Secondary | ICD-10-CM | POA: Diagnosis not present

## 2021-06-03 DIAGNOSIS — Z8673 Personal history of transient ischemic attack (TIA), and cerebral infarction without residual deficits: Secondary | ICD-10-CM | POA: Diagnosis not present

## 2021-06-03 DIAGNOSIS — R531 Weakness: Secondary | ICD-10-CM | POA: Diagnosis present

## 2021-06-03 DIAGNOSIS — I129 Hypertensive chronic kidney disease with stage 1 through stage 4 chronic kidney disease, or unspecified chronic kidney disease: Secondary | ICD-10-CM | POA: Insufficient documentation

## 2021-06-03 DIAGNOSIS — Z87891 Personal history of nicotine dependence: Secondary | ICD-10-CM | POA: Insufficient documentation

## 2021-06-03 DIAGNOSIS — N1832 Chronic kidney disease, stage 3b: Secondary | ICD-10-CM | POA: Insufficient documentation

## 2021-06-03 DIAGNOSIS — R42 Dizziness and giddiness: Secondary | ICD-10-CM

## 2021-06-03 DIAGNOSIS — R41840 Attention and concentration deficit: Secondary | ICD-10-CM | POA: Diagnosis not present

## 2021-06-03 DIAGNOSIS — Z951 Presence of aortocoronary bypass graft: Secondary | ICD-10-CM | POA: Diagnosis not present

## 2021-06-03 DIAGNOSIS — I951 Orthostatic hypotension: Principal | ICD-10-CM | POA: Diagnosis present

## 2021-06-03 DIAGNOSIS — Z7982 Long term (current) use of aspirin: Secondary | ICD-10-CM | POA: Diagnosis not present

## 2021-06-03 DIAGNOSIS — Z20822 Contact with and (suspected) exposure to covid-19: Secondary | ICD-10-CM | POA: Diagnosis not present

## 2021-06-03 DIAGNOSIS — I2510 Atherosclerotic heart disease of native coronary artery without angina pectoris: Secondary | ICD-10-CM | POA: Insufficient documentation

## 2021-06-03 DIAGNOSIS — D509 Iron deficiency anemia, unspecified: Secondary | ICD-10-CM | POA: Diagnosis not present

## 2021-06-03 DIAGNOSIS — Z79899 Other long term (current) drug therapy: Secondary | ICD-10-CM | POA: Diagnosis not present

## 2021-06-03 DIAGNOSIS — E860 Dehydration: Secondary | ICD-10-CM

## 2021-06-03 LAB — URINALYSIS, COMPLETE (UACMP) WITH MICROSCOPIC
Bilirubin Urine: NEGATIVE
Glucose, UA: 50 mg/dL — AB
Ketones, ur: 5 mg/dL — AB
Leukocytes,Ua: NEGATIVE
Nitrite: NEGATIVE
Protein, ur: 100 mg/dL — AB
Specific Gravity, Urine: 1.012 (ref 1.005–1.030)
pH: 6 (ref 5.0–8.0)

## 2021-06-03 LAB — CBC
HCT: 30.9 % — ABNORMAL LOW (ref 39.0–52.0)
Hemoglobin: 9.6 g/dL — ABNORMAL LOW (ref 13.0–17.0)
MCH: 24.4 pg — ABNORMAL LOW (ref 26.0–34.0)
MCHC: 31.1 g/dL (ref 30.0–36.0)
MCV: 78.6 fL — ABNORMAL LOW (ref 80.0–100.0)
Platelets: 310 10*3/uL (ref 150–400)
RBC: 3.93 MIL/uL — ABNORMAL LOW (ref 4.22–5.81)
RDW: 19 % — ABNORMAL HIGH (ref 11.5–15.5)
WBC: 12 10*3/uL — ABNORMAL HIGH (ref 4.0–10.5)
nRBC: 0 % (ref 0.0–0.2)

## 2021-06-03 LAB — ETHANOL: Alcohol, Ethyl (B): <10 mg/dL (ref <10)

## 2021-06-03 LAB — URINE DRUG SCREEN, QUALITATIVE (ARMC ONLY)
Amphetamines, Ur Screen: NOT DETECTED
Barbiturates, Ur Screen: NOT DETECTED
Benzodiazepine, Ur Scrn: NOT DETECTED
Cannabinoid 50 Ng, Ur ~~LOC~~: NOT DETECTED
Cocaine Metabolite,Ur ~~LOC~~: NOT DETECTED
MDMA (Ecstasy)Ur Screen: NOT DETECTED
Methadone Scn, Ur: NOT DETECTED
Opiate, Ur Screen: NOT DETECTED
Phencyclidine (PCP) Ur S: NOT DETECTED
Tricyclic, Ur Screen: NOT DETECTED

## 2021-06-03 LAB — BASIC METABOLIC PANEL
Anion gap: 13 (ref 5–15)
BUN: 24 mg/dL — ABNORMAL HIGH (ref 8–23)
CO2: 23 mmol/L (ref 22–32)
Calcium: 9 mg/dL (ref 8.9–10.3)
Chloride: 95 mmol/L — ABNORMAL LOW (ref 98–111)
Creatinine, Ser: 1.95 mg/dL — ABNORMAL HIGH (ref 0.61–1.24)
GFR, Estimated: 38 mL/min — ABNORMAL LOW (ref >60)
Glucose, Bld: 162 mg/dL — ABNORMAL HIGH (ref 70–99)
Potassium: 4.7 mmol/L (ref 3.5–5.1)
Sodium: 131 mmol/L — ABNORMAL LOW (ref 135–145)

## 2021-06-03 LAB — ACETAMINOPHEN LEVEL: Acetaminophen (Tylenol), Serum: <10 ug/mL — ABNORMAL LOW (ref 10–30)

## 2021-06-03 LAB — SALICYLATE LEVEL: Salicylate Lvl: <7 mg/dL — ABNORMAL LOW (ref 7.0–30.0)

## 2021-06-03 LAB — HEPATIC FUNCTION PANEL
ALT: 60 U/L — ABNORMAL HIGH (ref 0–44)
AST: 70 U/L — ABNORMAL HIGH (ref 15–41)
Albumin: 4.2 g/dL (ref 3.5–5.0)
Alkaline Phosphatase: 49 U/L (ref 38–126)
Bilirubin, Direct: 0.5 mg/dL — ABNORMAL HIGH (ref 0.0–0.2)
Indirect Bilirubin: 2.2 mg/dL — ABNORMAL HIGH (ref 0.3–0.9)
Total Bilirubin: 2.7 mg/dL — ABNORMAL HIGH (ref 0.3–1.2)
Total Protein: 9 g/dL — ABNORMAL HIGH (ref 6.5–8.1)

## 2021-06-03 LAB — RESP PANEL BY RT-PCR (FLU A&B, COVID) ARPGX2
Influenza A by PCR: NEGATIVE
Influenza B by PCR: NEGATIVE
SARS Coronavirus 2 by RT PCR: NEGATIVE

## 2021-06-03 LAB — CBG MONITORING, ED: Glucose-Capillary: 170 mg/dL — ABNORMAL HIGH (ref 70–99)

## 2021-06-03 LAB — TROPONIN I (HIGH SENSITIVITY)
Troponin I (High Sensitivity): 10 ng/L (ref <18)
Troponin I (High Sensitivity): 8 ng/L (ref <18)

## 2021-06-03 LAB — LIPASE, BLOOD: Lipase: 67 U/L — ABNORMAL HIGH (ref 11–51)

## 2021-06-03 MED ORDER — SODIUM CHLORIDE 0.9 % IV BOLUS
1000.0000 mL | Freq: Once | INTRAVENOUS | Status: AC
  Administered 2021-06-03: 1000 mL via INTRAVENOUS

## 2021-06-03 MED ORDER — LACTATED RINGERS IV BOLUS
1000.0000 mL | Freq: Once | INTRAVENOUS | Status: AC
  Administered 2021-06-03: 1000 mL via INTRAVENOUS

## 2021-06-03 MED ORDER — SODIUM CHLORIDE 0.9 % IV BOLUS: 1000.0000 mL | Freq: Once | INTRAVENOUS | Status: DC

## 2021-06-03 NOTE — ED Notes (Signed)
Seizure pads in place

## 2021-06-03 NOTE — ED Notes (Signed)
Lab contacted to add on ETOH, salicylate, tylenol

## 2021-06-03 NOTE — ED Notes (Signed)
cbg 170

## 2021-06-03 NOTE — ED Triage Notes (Signed)
Pt arrived via [facility] from store where pt was at when he became diaphoretic and dizzy. Pt denies feeling bad or different before going to the store. Pt in triage reports he has had intermittent dizziness when standing for 2-3 months. Pt denies new medications.

## 2021-06-03 NOTE — ED Provider Notes (Signed)
[facility] Emergency Department Provider Note  ____________________________________________  Time seen: Approximately 6:27 PM  I have reviewed the triage vital signs and the nursing notes.   HISTORY  Chief Complaint Weakness    Level 5 Caveat: Portions of the History and Physical including HPI and review of systems are unable to be completely obtained due to patient being a poor historian due to dementia and cognitive disability  HPI [name] is a 60 y.o. male with a history of stroke, hypertension, CAD status post CABG who is brought to the ED today by EMS from a store.  He was at the store when he became diaphoretic and dizzy.  He feels like he was in his usual state of health when he first went to the store.  He has been having dizziness with standing for the past 2 or 3 months.  Denies chest pain or shortness of breath.  In triage, patient had an episode where he lost consciousness, gaze was fixed to the right, suspicious for seizure.      Past Medical History:  Diagnosis Date   CVA (cerebral infarction)    Headache    mild, since stroke 2012   Hypertension    MI (myocardial infarction) ([place]) 05/09/2012   Reading difficulty    pt reports he reads at "about a second grade level"   Stroke [facility]) 2012   numbness to left hand    Wears dentures    full upper and lower     Patient Active Problem List   Diagnosis Date Noted   Alcohol abuse    Syncope and collapse 05/31/2020   AKI (acute kidney injury) ([place]) 05/31/2020   Hypokalemia 05/31/2020   Memory loss of unknown cause 10/14/2018   Sleeping difficulty 10/14/2018   Mild aortic valve stenosis 10/08/2018   Hyperlipidemia 03/27/2018   Cognitive impairment 03/12/2018   History of stroke 03/12/2018   Hypertension 03/01/2018   Atherosclerosis of native arteries of extremity with intermittent claudication ([place]) 03/01/2018   Atherosclerotic peripheral vascular disease with intermittent  claudication ([place]) 01/03/2018   Benign essential HTN 12/07/2017   Bilateral carotid artery stenosis 12/07/2017   SOBOE (shortness of breath on exertion) 12/07/2017   Acute cholecystitis 07/24/2016   Coronary artery disease involving coronary bypass graft of native heart without angina pectoris    Tobacco abuse      Past Surgical History:  Procedure Laterality Date   CAROTID ENDARTERECTOMY Left    2012 or 2013   CHOLECYSTECTOMY N/A 07/26/2016   Procedure: LAPAROSCOPIC CHOLECYSTECTOMY;  Surgeon: Florene Glen, MD;  Location: [facility] ORS;  Service: General;  Laterality: N/A;   CORONARY ARTERY BYPASS GRAFT  05/10/2012   3 vessel     Prior to Admission medications   Medication Sig Start Date End Date Taking? Authorizing Provider  albuterol (PROVENTIL HFA) 108 (90 Base) MCG/ACT inhaler Inhale 2 puffs into the lungs every 6 (six) hours as needed for wheezing or shortness of breath.     [provider]  aspirin EC 81 MG EC tablet Take 1 tablet (81 mg total) by mouth daily. 07/27/16   Vaughan Basta, MD  donepezil (ARICEPT) 10 MG tablet Take 10 mg by mouth daily.     [provider]  fluticasone (FLOVENT HFA) 44 MCG/ACT inhaler Inhale 2 puffs into the lungs 2 (two) times daily.    [provider]  metoprolol tartrate (LOPRESSOR) 25 MG tablet Take 25 mg by mouth daily.    [provider]  omeprazole (PRILOSEC) 20 MG capsule Take 20 mg by mouth daily.    [provider]  pravastatin (PRAVACHOL) 20 MG tablet Take 20 mg by mouth at bedtime.     [provider]  QUEtiapine (SEROQUEL) 50 MG tablet Take 50 mg by mouth at bedtime.     [provider]  tamsulosin (FLOMAX) 0.4 MG CAPS capsule Take 1 capsule (0.4 mg total) by mouth daily. 01/14/19   Stoioff, Ronda Fairly, MD     Allergies Patient has no known allergies.   History reviewed. No pertinent family history.  Social History Social History   Tobacco Use   Smoking status:  Former    Packs/day: 2.00    Years: 43.00    Pack years: 86.00    Types: Cigarettes    Quit date: 11/2019    Years since quitting: 1.5   Smokeless tobacco: Current    Types: Snuff   Tobacco comments:    changed to dip  Vaping Use   Vaping Use: Never used  Substance Use Topics   Alcohol use: Yes    Alcohol/week: 12.0 standard drinks    Types: 12 Cans of beer per week   Drug use: No    Review of Systems Level 5 Caveat: Portions of the History and Physical including HPI and review of systems are unable to be completely obtained due to patient being a poor historian   Constitutional:   No known fever.  ENT:   No rhinorrhea. Cardiovascular:   No chest pain or syncope. Respiratory:   No dyspnea or cough. Gastrointestinal:   Negative for abdominal pain, vomiting and diarrhea.  Musculoskeletal:   Negative for focal pain or swelling ____________________________________________   PHYSICAL EXAM:  VITAL SIGNS: ED Triage Vitals [06/03/21 1630]  Enc Vitals Group     BP 125/83     Pulse Rate 95     Resp 18     Temp 98.2 F (36.8 C)     Temp Source Oral     SpO2 96 %     Weight      Height      Head Circumference      Peak Flow      Pain Score      Pain Loc      Pain Edu?      Excl. in [place]?     Vital signs reviewed, nursing assessments reviewed.   Constitutional:   Awake and alert.  Oriented to person and place Eyes:   Conjunctivae are normal. EOMI. PERRL. ENT      Head:   Normocephalic and atraumatic.      Nose:   No congestion/rhinnorhea.       Mouth/Throat:   Dry mucous membranes, no pharyngeal erythema. No peritonsillar mass.       Neck:   No meningismus. Full ROM. Hematological/Lymphatic/Immunilogical:   No cervical lymphadenopathy. Cardiovascular:   RRR. Symmetric bilateral radial and DP pulses.  No murmurs. Cap refill less than 2 seconds. Respiratory:   Normal respiratory effort without tachypnea/retractions. Breath sounds are clear and equal bilaterally. No  wheezes/rales/rhonchi. Gastrointestinal:   Soft and nontender. Non distended. There is no CVA tenderness.  No rebound, rigidity, or guarding. Genitourinary:   deferred Musculoskeletal:   Normal range of motion in all extremities. No joint effusions.  No lower extremity tenderness.  No edema. Neurologic:   Normal speech and language.  Motor grossly intact. No acute focal neurologic deficits are appreciated.  Skin:    Skin is  warm, dry and intact. No rash noted.  No petechiae, purpura, or bullae.  ____________________________________________    LABS (pertinent positives/negatives) (all labs ordered are listed, but only abnormal results are displayed) Labs Reviewed  BASIC METABOLIC PANEL - Abnormal; Notable for the following components:      Result Value   Sodium 131 (*)    Chloride 95 (*)    Glucose, Bld 162 (*)    BUN 24 (*)    Creatinine, Ser 1.95 (*)    GFR, Estimated 38 (*)    All other components within normal limits  CBC - Abnormal; Notable for the following components:   WBC 12.0 (*)    RBC 3.93 (*)    Hemoglobin 9.6 (*)    HCT 30.9 (*)    MCV 78.6 (*)    MCH 24.4 (*)    RDW 19.0 (*)    All other components within normal limits  URINALYSIS, COMPLETE (UACMP) WITH MICROSCOPIC - Abnormal; Notable for the following components:   Color, Urine YELLOW (*)    APPearance CLOUDY (*)    Glucose, UA 50 (*)    Hgb urine dipstick SMALL (*)    Ketones, ur 5 (*)    Protein, ur 100 (*)    All other components within normal limits  ACETAMINOPHEN LEVEL - Abnormal; Notable for the following components:   Acetaminophen (Tylenol), Serum <10 (*)    All other components within normal limits  SALICYLATE LEVEL - Abnormal; Notable for the following components:   Salicylate Lvl <3.5 (*)    All other components within normal limits  HEPATIC FUNCTION PANEL - Abnormal; Notable for the following components:   Total Protein 9.0 (*)    AST 70 (*)    ALT 60 (*)    Total Bilirubin 2.7 (*)     Bilirubin, Direct 0.5 (*)    Indirect Bilirubin 2.2 (*)    All other components within normal limits  LIPASE, BLOOD - Abnormal; Notable for the following components:   Lipase 67 (*)    All other components within normal limits  CBG MONITORING, ED - Abnormal; Notable for the following components:   Glucose-Capillary 170 (*)    All other components within normal limits  RESP PANEL BY RT-PCR (FLU A&B, COVID) ARPGX2  ETHANOL  URINE DRUG SCREEN, QUALITATIVE ([facility] ONLY)  TROPONIN I (HIGH SENSITIVITY)  TROPONIN I (HIGH SENSITIVITY)   ____________________________________________   EKG  Interpreted by me  Date: 06/03/2021  Rate: 94  Rhythm: normal sinus rhythm  QRS Axis: normal  Intervals: normal  ST/T Wave abnormalities: normal  Conduction Disutrbances: none  Narrative Interpretation: unremarkable     ____________________________________________    RADIOLOGY  CT Head Wo Contrast  Result Date: 06/03/2021 CLINICAL DATA:  Seizure EXAM: CT HEAD WITHOUT CONTRAST TECHNIQUE: Contiguous axial images were obtained from the base of the skull through the vertex without intravenous contrast. COMPARISON:  CT head 05/31/2020 FINDINGS: Brain: Ventricle size normal. Mild cerebral volume loss. Chronic infarct right frontal lobe unchanged. Negative for acute infarct, hemorrhage, mass Vascular: Negative for hyperdense vessel Skull: Negative Sinuses/Orbits: Mild mucosal edema paranasal sinuses. Negative orbit Other: None IMPRESSION: No acute abnormality and no change from the prior study. Chronic infarct right frontal lobe. Electronically Signed   By: Franchot Gallo M.D.   On: 06/03/2021 17:24   DG Chest Portable 1 View  Result Date: 06/03/2021 CLINICAL DATA:  Seizure, weakness EXAM: PORTABLE CHEST 1 VIEW COMPARISON:  07/24/2016 FINDINGS: Postop CABG. Negative for heart failure. Lungs  clear without infiltrate or effusion. IMPRESSION: No active disease. Electronically Signed   By: Franchot Gallo M.D.    On: 06/03/2021 17:30    ____________________________________________   PROCEDURES Procedures  ____________________________________________  DIFFERENTIAL DIAGNOSIS   Intracranial hemorrhage, pneumonia, viral illness, alcohol intoxication or withdrawal, dehydration, electrolyte abnormality  CLINICAL IMPRESSION / ASSESSMENT AND PLAN / ED COURSE  Medications ordered in the ED: Medications  sodium chloride 0.9 % bolus 1,000 mL (0 mLs Intravenous Stopped 06/03/21 2010)  lactated ringers bolus 1,000 mL (1,000 mLs Intravenous New Bag/Given 06/03/21 2054)    Pertinent labs & imaging results that were available during my care of the patient were reviewed by me and considered in my medical decision making (see chart for details).   Dabid Godown was evaluated in Emergency Department on 06/03/2021 for the symptoms described in the history of present illness. He was evaluated in the context of the global COVID-19 pandemic, which necessitated consideration that the patient might be at risk for infection with the SARS-CoV-2 virus that causes COVID-19. Institutional protocols and algorithms that pertain to the evaluation of patients at risk for COVID-19 are in a state of rapid change based on information released by regulatory bodies including the CDC and federal and state organizations. These policies and algorithms were followed during the patient's care in the ED.   Patient presents with altered mental status, possible new onset seizure.  Will check labs, chest x-ray, CT head, COVID swab.  Give IV fluids for hydration.  Clinical Course as of 06/03/21 2356  Fri Jun 03, 2021  2023 Orthostatics positive for significant change in heart rate and symptoms.  Will give additional liter bolus of IV fluids. [PS]    Clinical Course User Index [PS] Carrie Mew, MD    ----------------------------------------- 11:56 PM on 06/03/2021 ----------------------------------------- Patient feeling better  after receiving second liter of IV fluids.  Tolerating p.o.  We will recheck orthostatics, if improved can be discharged home.   ____________________________________________   FINAL CLINICAL IMPRESSION(S) / ED DIAGNOSES    Final diagnoses:  Dehydration  Orthostatic dizziness     ED Discharge Orders     None       Portions of this note were generated with dragon dictation software. Dictation errors may occur despite best attempts at proofreading.   Carrie Mew, MD 06/03/21 [phone]

## 2021-06-03 NOTE — ED Notes (Signed)
Pt brought back from triage for witnessed seizure like activity and agonal breathing.  Pt alert and oriented when arrived to room. Pale in color. Diaphoretic  Pt reports chest tightness

## 2021-06-03 NOTE — ED Notes (Signed)
Pt alert and oriented. Continues to be on 2L [place] chronic. Denies pain

## 2021-06-03 NOTE — ED Notes (Signed)
Pt in triage becomes diaphoretic, starts to shake and eyes deviate to the left side. Pt with agonal breathing present. Pulse felt. Oxygen applied and pt assisted in wheelchair by staff to room 5 where MD at bedside. Upon arrival to room 5, pt awake but still diaphoretic and pale in color.

## 2021-06-04 ENCOUNTER — Encounter: Payer: Self-pay | Admitting: Family Medicine

## 2021-06-04 ENCOUNTER — Other Ambulatory Visit: Payer: Self-pay

## 2021-06-04 ENCOUNTER — Observation Stay: Payer: Medicare (Managed Care)

## 2021-06-04 DIAGNOSIS — I10 Essential (primary) hypertension: Secondary | ICD-10-CM | POA: Diagnosis not present

## 2021-06-04 DIAGNOSIS — E860 Dehydration: Secondary | ICD-10-CM

## 2021-06-04 DIAGNOSIS — E785 Hyperlipidemia, unspecified: Secondary | ICD-10-CM

## 2021-06-04 DIAGNOSIS — I951 Orthostatic hypotension: Secondary | ICD-10-CM | POA: Diagnosis present

## 2021-06-04 LAB — CBC
HCT: 26.2 % — ABNORMAL LOW (ref 39.0–52.0)
Hemoglobin: 8.3 g/dL — ABNORMAL LOW (ref 13.0–17.0)
MCH: 24.8 pg — ABNORMAL LOW (ref 26.0–34.0)
MCHC: 31.7 g/dL (ref 30.0–36.0)
MCV: 78.2 fL — ABNORMAL LOW (ref 80.0–100.0)
Platelets: 224 10*3/uL (ref 150–400)
RBC: 3.35 MIL/uL — ABNORMAL LOW (ref 4.22–5.81)
RDW: 19.5 % — ABNORMAL HIGH (ref 11.5–15.5)
WBC: 9.4 10*3/uL (ref 4.0–10.5)
nRBC: 0 % (ref 0.0–0.2)

## 2021-06-04 LAB — BASIC METABOLIC PANEL
Anion gap: 7 (ref 5–15)
BUN: 27 mg/dL — ABNORMAL HIGH (ref 8–23)
CO2: 27 mmol/L (ref 22–32)
Calcium: 8.6 mg/dL — ABNORMAL LOW (ref 8.9–10.3)
Chloride: 97 mmol/L — ABNORMAL LOW (ref 98–111)
Creatinine, Ser: 1.59 mg/dL — ABNORMAL HIGH (ref 0.61–1.24)
GFR, Estimated: 49 mL/min — ABNORMAL LOW (ref >60)
Glucose, Bld: 101 mg/dL — ABNORMAL HIGH (ref 70–99)
Potassium: 4.3 mmol/L (ref 3.5–5.1)
Sodium: 131 mmol/L — ABNORMAL LOW (ref 135–145)

## 2021-06-04 LAB — HIV ANTIBODY (ROUTINE TESTING W REFLEX): HIV Screen 4th Generation wRfx: NONREACTIVE

## 2021-06-04 MED ORDER — SODIUM CHLORIDE 0.9 % IV SOLN: INTRAVENOUS | Status: DC

## 2021-06-04 MED ORDER — UMECLIDINIUM BROMIDE 62.5 MCG/INH IN AEPB
1.0000 | INHALATION_SPRAY | Freq: Every day | RESPIRATORY_TRACT | Status: DC
  Administered 2021-06-04 – 2021-06-05 (×2): 1 via RESPIRATORY_TRACT
  Filled 2021-06-04: qty 7

## 2021-06-04 MED ORDER — LISINOPRIL 10 MG PO TABS
20.0000 mg | ORAL_TABLET | Freq: Every day | ORAL | Status: DC
  Administered 2021-06-04: 20 mg via ORAL
  Filled 2021-06-04: qty 2

## 2021-06-04 MED ORDER — FLUTICASONE-UMECLIDIN-VILANT 100-62.5-25 MCG/INH IN AEPB: 1.0000 | INHALATION_SPRAY | Freq: Every day | RESPIRATORY_TRACT | Status: DC

## 2021-06-04 MED ORDER — MAGNESIUM HYDROXIDE 400 MG/5ML PO SUSP: 30.0000 mL | Freq: Every day | ORAL | Status: DC | PRN

## 2021-06-04 MED ORDER — ALBUTEROL SULFATE (2.5 MG/3ML) 0.083% IN NEBU: 2.5000 mg | INHALATION_SOLUTION | Freq: Four times a day (QID) | RESPIRATORY_TRACT | Status: DC | PRN

## 2021-06-04 MED ORDER — METOPROLOL SUCCINATE ER 25 MG PO TB24
25.0000 mg | ORAL_TABLET | Freq: Every day | ORAL | Status: DC
  Administered 2021-06-04: 25 mg via ORAL
  Filled 2021-06-04: qty 1

## 2021-06-04 MED ORDER — ENOXAPARIN SODIUM 40 MG/0.4ML IJ SOSY: 40.0000 mg | PREFILLED_SYRINGE | INTRAMUSCULAR | Status: DC

## 2021-06-04 MED ORDER — DONEPEZIL HCL 5 MG PO TABS
10.0000 mg | ORAL_TABLET | Freq: Every day | ORAL | Status: DC
  Administered 2021-06-04 – 2021-06-05 (×2): 10 mg via ORAL
  Filled 2021-06-04 (×2): qty 2

## 2021-06-04 MED ORDER — FLUTICASONE FUROATE-VILANTEROL 100-25 MCG/INH IN AEPB
1.0000 | INHALATION_SPRAY | Freq: Every day | RESPIRATORY_TRACT | Status: DC
  Administered 2021-06-04 – 2021-06-05 (×2): 1 via RESPIRATORY_TRACT
  Filled 2021-06-04: qty 28

## 2021-06-04 MED ORDER — ACETAMINOPHEN 650 MG RE SUPP: 650.0000 mg | Freq: Four times a day (QID) | RECTAL | Status: DC | PRN

## 2021-06-04 MED ORDER — ONDANSETRON HCL 4 MG/2ML IJ SOLN: 4.0000 mg | Freq: Four times a day (QID) | INTRAMUSCULAR | Status: DC | PRN

## 2021-06-04 MED ORDER — ENOXAPARIN SODIUM 60 MG/0.6ML IJ SOSY: 0.5000 mg/kg | PREFILLED_SYRINGE | INTRAMUSCULAR | Status: DC

## 2021-06-04 MED ORDER — TRAZODONE HCL 50 MG PO TABS
25.0000 mg | ORAL_TABLET | Freq: Every evening | ORAL | Status: DC | PRN
  Administered 2021-06-04: 25 mg via ORAL
  Filled 2021-06-04: qty 1

## 2021-06-04 MED ORDER — ALBUTEROL SULFATE HFA 108 (90 BASE) MCG/ACT IN AERS: 2.0000 | INHALATION_SPRAY | Freq: Four times a day (QID) | RESPIRATORY_TRACT | Status: DC | PRN

## 2021-06-04 MED ORDER — ASPIRIN EC 81 MG PO TBEC
81.0000 mg | DELAYED_RELEASE_TABLET | Freq: Every day | ORAL | Status: DC
  Administered 2021-06-04: 81 mg via ORAL
  Filled 2021-06-04: qty 1

## 2021-06-04 MED ORDER — ONDANSETRON HCL 4 MG PO TABS: 4.0000 mg | ORAL_TABLET | Freq: Four times a day (QID) | ORAL | Status: DC | PRN

## 2021-06-04 MED ORDER — MIRTAZAPINE 15 MG PO TABS
15.0000 mg | ORAL_TABLET | Freq: Every day | ORAL | Status: DC
  Administered 2021-06-04: 15 mg via ORAL
  Filled 2021-06-04: qty 1

## 2021-06-04 MED ORDER — PANTOPRAZOLE SODIUM 40 MG PO TBEC
40.0000 mg | DELAYED_RELEASE_TABLET | Freq: Every day | ORAL | Status: DC
  Administered 2021-06-04 – 2021-06-05 (×2): 40 mg via ORAL
  Filled 2021-06-04 (×2): qty 1

## 2021-06-04 MED ORDER — PRAVASTATIN SODIUM 20 MG PO TABS
20.0000 mg | ORAL_TABLET | Freq: Every day | ORAL | Status: DC
  Administered 2021-06-04: 20 mg via ORAL
  Filled 2021-06-04 (×2): qty 1

## 2021-06-04 MED ORDER — ACETAMINOPHEN 325 MG PO TABS
650.0000 mg | ORAL_TABLET | Freq: Four times a day (QID) | ORAL | Status: DC | PRN
  Administered 2021-06-05: 650 mg via ORAL
  Filled 2021-06-04: qty 2

## 2021-06-04 NOTE — ED Notes (Signed)
Pt taken for MRI.

## 2021-06-04 NOTE — ED Notes (Signed)
Pt eating breakfast 

## 2021-06-04 NOTE — Progress Notes (Signed)
PHARMACIST - PHYSICIAN COMMUNICATION  CONCERNING:  Enoxaparin (Lovenox) for DVT Prophylaxis   DESCRIPTION: Patient was prescribed enoxaprin 40mg  q24 hours for VTE prophylaxis.   Filed Weights   06/04/21 1339  Weight: 112 kg (247 lb)    Body mass index is 33.5 kg/m.  Estimated Creatinine Clearance: 62.3 mL/min (A) (by C-G formula based on SCr of 1.59 mg/dL (H)).  Patient is candidate for enoxaparin 30mg  every 24 hours based on CrCl <1ml/min or Weight <45kg  RECOMMENDATION: Pharmacy has adjusted enoxaparin dose per [facility] policy.  Patient is now receiving enoxaparin 55 mg every 24 hours    Darnelle Bos, PharmD Clinical Pharmacist  06/04/2021 2:32 PM

## 2021-06-04 NOTE — ED Provider Notes (Signed)
  Physical Exam  BP (!) 153/81   Pulse 86   Temp 98.2 F (36.8 C) (Oral)   Resp 18   SpO2 100%   Physical Exam  ED Course/Procedures   Clinical Course as of 06/04/21 0053  Fri Jun 03, 2021  2023 Orthostatics positive for significant change in heart rate and symptoms.  Will give additional liter bolus of IV fluids. [PS]    Clinical Course User Index [PS] Carrie Mew, MD    Procedures  MDM  12:45 AM  Assumed care.  Patient here with dizziness, near syncope likely from dehydration, orthostasis.  Has received 2 L of IV fluids and continues to be orthostatic, unsteady and able to ambulate.  Plan was to admit patient if continues to be symptomatic.  We will continue IV fluids and discussed with hospitalist for admission.  12:53 AM Discussed patient's case with hospitalist, Dr. Sidney Ace.  I have recommended admission and patient (and family if present) agree with this plan. Admitting physician will place admission orders.   I reviewed all nursing notes, vitals, pertinent previous records and reviewed/interpreted all EKGs, lab and urine results, imaging (as available).        [name], Douglas Bison, DO 06/04/21 [phone]

## 2021-06-04 NOTE — H&P (Signed)
PATIENT NAME: [name]    MR#:  [id number]  DATE OF BIRTH:  [birth year]  DATE OF ADMISSION:  06/03/2021  PRIMARY CARE PHYSICIAN: [facility], [facility] ([place]   Patient is coming from: Home  REQUESTING/REFERRING PHYSICIAN: Ward, Cyril Mourning, DO  CHIEF COMPLAINT:   Chief Complaint  Patient presents with   Weakness    HISTORY OF PRESENT ILLNESS:  [name] is a 60 y.o. male with medical history significant for CVA, hypertension and coronary artery disease, presented to the ER with near syncope.  The patient was getting out of his truck and walking when he experienced dizziness and lightheadedness but denies any falls.  He became diaphoretic and denied any chest pain or palpitations.  No nausea or vomiting or abdominal pain.  No fever or chills.  No dysuria, oliguria or hematuria or flank pain.  Denies any recent cough or wheezing or dyspnea.    ED Course: When he came to the ER vital signs were within normal and later blood pressure was 146/72.  He was orthostatic and after hydration with 2 L of crystalloids, 1 L of lactated ringer and 1 L of IV normal saline he was still orthostatic and was not able to stand.   EKG as reviewed by me : Showed normal sinus rhythm with rate of 94 Imaging: Portable chest x-ray showed no acute cardiopulmonary disease.  Noncontrasted head CT scan showed chronic infarct in the right frontal lobe with no acute intracranial abnormalities.  The patient was hydrated with 1 L bolus of IV lactated Ringer and 1 L of IV normal saline followed by 25 mill per hour.  He will be admitted to an observation PAST MEDICAL HISTORY:   Past Medical History:  Diagnosis Date   CVA (cerebral infarction)    Headache    mild, since stroke 2012   Hypertension    MI (myocardial infarction) ([place]) 05/09/2012   Reading difficulty    pt reports he reads at "about a second grade level"   Stroke ([place]) 2012   numbness to left hand    Wears dentures     full upper and lower    PAST SURGICAL HISTORY:   Past Surgical History:  Procedure Laterality Date   CAROTID ENDARTERECTOMY Left    2012 or 2013   CHOLECYSTECTOMY N/A 07/26/2016   Procedure: LAPAROSCOPIC CHOLECYSTECTOMY;  Surgeon: Florene Glen, MD;  Location: [facility] ORS;  Service: General;  Laterality: N/A;   CORONARY ARTERY BYPASS GRAFT  05/10/2012   3 vessel    SOCIAL HISTORY:   Social History   Tobacco Use   Smoking status: Former    Packs/day: 2.00    Years: 43.00    Pack years: 86.00    Types: Cigarettes    Quit date: 11/2019    Years since quitting: 1.5   Smokeless tobacco: Current    Types: Snuff   Tobacco comments:    changed to dip  Substance Use Topics   Alcohol use: Yes    Alcohol/week: 12.0 standard drinks    Types: 12 Cans of beer per week    FAMILY HISTORY:  History reviewed. No pertinent family history. No pertinent familial diseases. DRUG ALLERGIES:  No Known Allergies  REVIEW OF SYSTEMS:   ROS As per history of present illness. All pertinent systems were reviewed above. Constitutional, HEENT, cardiovascular, respiratory, GI, GU, musculoskeletal, neuro, psychiatric, endocrine, integumentary and hematologic systems were reviewed and are otherwise negative/unremarkable except for positive findings  mentioned above in the HPI.   MEDICATIONS AT HOME:   Prior to Admission medications   Medication Sig Start Date End Date Taking? Authorizing Provider  aspirin EC 81 MG EC tablet Take 1 tablet (81 mg total) by mouth daily. 07/27/16  Yes Vaughan Basta, MD  donepezil (ARICEPT) 10 MG tablet Take 10 mg by mouth daily.    Yes [provider]  lisinopril (ZESTRIL) 20 MG tablet Take 20 mg by mouth daily. 05/24/21  Yes [provider]  metoprolol succinate (TOPROL-XL) 25 MG 24 hr tablet Take 25 mg by mouth daily. 05/24/21  Yes [provider]  omeprazole (PRILOSEC) 20 MG capsule Take 20 mg by mouth daily.   Yes [provider]  albuterol (VENTOLIN HFA) 108 (90 Base) MCG/ACT inhaler Inhale 2 puffs into the lungs every 6 (six) hours as needed for wheezing or shortness of breath.     [provider]  mirtazapine (REMERON) 15 MG tablet Take 15 mg by mouth at bedtime. 05/24/21   [provider]  pravastatin (PRAVACHOL) 20 MG tablet Take 20 mg by mouth at bedtime.     [provider]  TRELEGY ELLIPTA 100-62.5-25 MCG/INH AEPB Inhale 1 puff into the lungs daily. 05/24/21   [provider]      VITAL SIGNS:  Blood pressure (!) 97/58, pulse 97, temperature 98.2 F (36.8 C), temperature source Oral, resp. rate 15, SpO2 99 %.  PHYSICAL EXAMINATION:  Physical Exam  GENERAL:  59 y.o.-year-old patient lying in the bed with no acute distress.  EYES: Pupils equal, round, reactive to light and accommodation. No scleral icterus. Extraocular muscles intact.  HEENT: Head atraumatic, normocephalic. Oropharynx and nasopharynx clear.  NECK:  Supple, no jugular venous distention. No thyroid enlargement, no tenderness.  LUNGS: Normal breath sounds bilaterally, no wheezing, rales,rhonchi or crepitation. No use of accessory muscles of respiration.  CARDIOVASCULAR: Regular rate and rhythm, S1, S2 normal. No murmurs, rubs, or gallops.  ABDOMEN: Soft, nondistended, nontender. Bowel sounds present. No organomegaly or mass.  EXTREMITIES: No pedal edema, cyanosis, or clubbing.  NEUROLOGIC: Cranial nerves II through XII are intact. Muscle strength 5/5 in all extremities. Sensation intact. Gait not checked.  PSYCHIATRIC: The patient is alert and oriented x 3.  Normal affect and good eye contact. SKIN: No obvious rash, lesion, or ulcer.   LABORATORY PANEL:   CBC Recent Labs  Lab 06/03/21 1633  WBC 12.0*  HGB 9.6*  HCT 30.9*  PLT 310   ------------------------------------------------------------------------------------------------------------------  Chemistries  Recent Labs  Lab  06/03/21 1633  NA 131*  K 4.7  CL 95*  CO2 23  GLUCOSE 162*  BUN 24*  CREATININE 1.95*  CALCIUM 9.0  AST 70*  ALT 60*  ALKPHOS 49  BILITOT 2.7*   ------------------------------------------------------------------------------------------------------------------  Cardiac Enzymes No results for input(s): TROPONINI in the last 168 hours. ------------------------------------------------------------------------------------------------------------------  RADIOLOGY:  CT Head Wo Contrast  Result Date: 06/03/2021 CLINICAL DATA:  Seizure EXAM: CT HEAD WITHOUT CONTRAST TECHNIQUE: Contiguous axial images were obtained from the base of the skull through the vertex without intravenous contrast. COMPARISON:  CT head 05/31/2020 FINDINGS: Brain: Ventricle size normal. Mild cerebral volume loss. Chronic infarct right frontal lobe unchanged. Negative for acute infarct, hemorrhage, mass Vascular: Negative for hyperdense vessel Skull: Negative Sinuses/Orbits: Mild mucosal edema paranasal sinuses. Negative orbit Other: None IMPRESSION: No acute abnormality and no change from the prior study. Chronic infarct right frontal lobe. Electronically Signed   By: Franchot Gallo M.D.   On:  06/03/2021 17:24   DG Chest Portable 1 View  Result Date: 06/03/2021 CLINICAL DATA:  Seizure, weakness EXAM: PORTABLE CHEST 1 VIEW COMPARISON:  07/24/2016 FINDINGS: Postop CABG. Negative for heart failure. Lungs clear without infiltrate or effusion. IMPRESSION: No active disease. Electronically Signed   By: Franchot Gallo M.D.   On: 06/03/2021 17:30      IMPRESSION AND PLAN:  Active Problems:   Orthostatic hypotension  1.  Near syncope secondary to orthostatic hypotension likely secondary to volume depletion and dehydration. - We will admit to an observation medically monitored bed. - The patient will be hydrated with IV normal saline. - We will check his orthostatics every shift.  2.  Essential hypertension. - We will  continue his antihypertensives.  3.  Stage IIIb chronic kidney disease. - This is currently stable. - The patient will be hydrated with IV normal saline as mentioned above and will follow BMPs.  4.  Cognitive impairment. - We will continue Aricept.  5.  Dyslipidemia. - We will continue statin therapy.  6.  GERD. - We will continue PPI therapy.  DVT prophylaxis: Lovenox. Code Status: full code. Family Communication:  The plan of care was discussed in details with the patient (and family). I answered all questions. The patient agreed to proceed with the above mentioned plan. Further management will depend upon hospital course. Disposition Plan: Back to previous home environment Consults called: none. All the records are reviewed and case discussed with ED provider.  Status is: Observation  The patient remains OBS appropriate and will d/c before 2 midnights.  Dispo: The patient is from: Home              Anticipated d/c is to: Home              Patient currently is not medically stable to d/c.   Difficult to place patient No   TOTAL TIME TAKING CARE OF THIS PATIENT: 55 minutes.    Christel Mormon M.D on 06/04/2021 at 6:13 AM  [facility]   From 7 PM-7 AM, contact night-coverage [email or web address]  CC: Primary care physician; Center, [facility]

## 2021-06-04 NOTE — Progress Notes (Signed)
[facility] PLAN OF CARE NOTE Patient: [name] HOZ:[id number]   PCP: Clyde Park DOB: 06-06-1961   DOA: 06/03/2021   DOS: 06/04/2021    Patient was admitted by my colleague earlier on 06/04/2021. I have reviewed the H&P as well as assessment and plan and agree with the same. Important changes in the plan are listed below.  Plan of care: Active Problems:   Orthostatic hypotension Blood pressure is improving.  Patient with persistent dizziness symptoms.  Suspect vestibular symptoms but does not have any nystagmus on examination. MRI brain negative for any acute stroke. Vestibular evaluation requested.  Monitor on telemetry. As needed meclizine.  Microcytic anemia. Will initiate further work-up. Hemoglobin trending down from baseline of around 12-9.6.  Dropped from 9.6-8.3 is likely dilutional in nature. Patient denies any bleeding history. Continue to monitor and check FOBT.    Author: Berle Mull, MD [facility] 06/04/2021 5:21 PM   If 7PM-7AM, please contact night-coverage at [email or web address]

## 2021-06-05 ENCOUNTER — Other Ambulatory Visit: Payer: Self-pay | Admitting: Internal Medicine

## 2021-06-05 DIAGNOSIS — I951 Orthostatic hypotension: Secondary | ICD-10-CM | POA: Diagnosis not present

## 2021-06-05 DIAGNOSIS — E860 Dehydration: Secondary | ICD-10-CM | POA: Diagnosis not present

## 2021-06-05 DIAGNOSIS — D519 Vitamin B12 deficiency anemia, unspecified: Secondary | ICD-10-CM

## 2021-06-05 LAB — IRON AND TIBC
Iron: 26 ug/dL — ABNORMAL LOW (ref 45–182)
Saturation Ratios: 6 % — ABNORMAL LOW (ref 17.9–39.5)
TIBC: 442 ug/dL (ref 250–450)
UIBC: 416 ug/dL

## 2021-06-05 LAB — COMPREHENSIVE METABOLIC PANEL
ALT: 54 U/L — ABNORMAL HIGH (ref 0–44)
AST: 65 U/L — ABNORMAL HIGH (ref 15–41)
Albumin: 3.4 g/dL — ABNORMAL LOW (ref 3.5–5.0)
Alkaline Phosphatase: 40 U/L (ref 38–126)
Anion gap: 7 (ref 5–15)
BUN: 16 mg/dL (ref 8–23)
CO2: 27 mmol/L (ref 22–32)
Calcium: 8.7 mg/dL — ABNORMAL LOW (ref 8.9–10.3)
Chloride: 100 mmol/L (ref 98–111)
Creatinine, Ser: 1.16 mg/dL (ref 0.61–1.24)
GFR, Estimated: >60 mL/min (ref >60)
Glucose, Bld: 94 mg/dL (ref 70–99)
Potassium: 3.9 mmol/L (ref 3.5–5.1)
Sodium: 134 mmol/L — ABNORMAL LOW (ref 135–145)
Total Bilirubin: 1.3 mg/dL — ABNORMAL HIGH (ref 0.3–1.2)
Total Protein: 7.4 g/dL (ref 6.5–8.1)

## 2021-06-05 LAB — CBC WITH DIFFERENTIAL/PLATELET
Abs Immature Granulocytes: 0.16 10*3/uL — ABNORMAL HIGH (ref 0.00–0.07)
Basophils Absolute: 0.1 10*3/uL (ref 0.0–0.1)
Basophils Relative: 1 %
Eosinophils Absolute: 0.4 10*3/uL (ref 0.0–0.5)
Eosinophils Relative: 4 %
HCT: 28.8 % — ABNORMAL LOW (ref 39.0–52.0)
Hemoglobin: 8.7 g/dL — ABNORMAL LOW (ref 13.0–17.0)
Immature Granulocytes: 2 %
Lymphocytes Relative: 22 %
Lymphs Abs: 2 10*3/uL (ref 0.7–4.0)
MCH: 24.1 pg — ABNORMAL LOW (ref 26.0–34.0)
MCHC: 30.2 g/dL (ref 30.0–36.0)
MCV: 79.8 fL — ABNORMAL LOW (ref 80.0–100.0)
Monocytes Absolute: 1.1 10*3/uL — ABNORMAL HIGH (ref 0.1–1.0)
Monocytes Relative: 11 %
Neutro Abs: 5.7 10*3/uL (ref 1.7–7.7)
Neutrophils Relative %: 60 %
Platelets: 252 10*3/uL (ref 150–400)
RBC: 3.61 MIL/uL — ABNORMAL LOW (ref 4.22–5.81)
RDW: 19.3 % — ABNORMAL HIGH (ref 11.5–15.5)
WBC: 9.4 10*3/uL (ref 4.0–10.5)
nRBC: 0 % (ref 0.0–0.2)

## 2021-06-05 LAB — FERRITIN: Ferritin: 25 ng/mL (ref 24–336)

## 2021-06-05 LAB — FOLATE: Folate: 9.6 ng/mL (ref >5.9)

## 2021-06-05 LAB — RETIC PANEL
Immature Retic Fract: 23 % — ABNORMAL HIGH (ref 2.3–15.9)
RBC.: 3.41 MIL/uL — ABNORMAL LOW (ref 4.22–5.81)
Retic Count, Absolute: 56.9 10*3/uL (ref 19.0–186.0)
Retic Ct Pct: 1.7 % (ref 0.4–3.1)
Reticulocyte Hemoglobin: 29.7 pg (ref >27.9)

## 2021-06-05 LAB — TYPE AND SCREEN
ABO/RH(D): A POS
Antibody Screen: NEGATIVE

## 2021-06-05 LAB — MAGNESIUM: Magnesium: 1.8 mg/dL (ref 1.7–2.4)

## 2021-06-05 LAB — VITAMIN B12: Vitamin B-12: 183 pg/mL (ref 180–914)

## 2021-06-05 MED ORDER — VITAMIN B-12 1000 MCG PO TABS: 1000.0000 ug | ORAL_TABLET | Freq: Every day | ORAL | 0 refills | Status: DC

## 2021-06-05 MED ORDER — OMEPRAZOLE 20 MG PO CPDR: 20.0000 mg | DELAYED_RELEASE_CAPSULE | Freq: Two times a day (BID) | ORAL | 0 refills | Status: DC

## 2021-06-05 MED ORDER — FERROUS SULFATE 325 (65 FE) MG PO TABS: 325.0000 mg | ORAL_TABLET | Freq: Every day | ORAL | 3 refills | Status: DC

## 2021-06-05 MED ORDER — SODIUM CHLORIDE 0.9 % IV SOLN
510.0000 mg | Freq: Once | INTRAVENOUS | Status: AC
  Administered 2021-06-05: 510 mg via INTRAVENOUS
  Filled 2021-06-05: qty 17

## 2021-06-05 MED ORDER — AMLODIPINE BESYLATE 5 MG PO TABS: 5.0000 mg | ORAL_TABLET | Freq: Every day | ORAL | 0 refills | Status: DC

## 2021-06-05 NOTE — Evaluation (Addendum)
Physical Therapy Evaluation Patient Details Name: Tyrian Peart MRN: [id number] DOB: 07-17-1961 Today's Date: 06/05/2021   History of Present Illness  Pt is a 60 y/o M admitted on 06/03/21 with c/c of weakness & near syncope. Pt found to be orthostatic. Head CT showed chronic infarct in R frontal lob with note acute intracranial abnormalities. PMH: CVA, HTN, CAD  Clinical Impression  Pt seen for PT evaluation with pt reporting he is independent with mobility at home without AD & denies falls in the past 6 months. Pt reports he was trying to lose weight & hadn't ate for 3 days when this event happened in the community. PT educated pt on need to eat to supply body with proper nutrients. Pt is currently independent with all mobility, ambulating up to 2 laps around nurses station without AD. Pt does endorse SOB but SpO2 >90% & pt noting this is more ambulation than he typically performs. At this time pt does not demonstrate any acute PT needs, will sign off. Please re-consult if new needs arise.   BP checked in LUE: Supine: 160/70 mmHg (MAP 97), HR 88 bpm Sitting: 148/89 mmHg (MAP 104), HR 106 bpm Standing at 0: 145/85 mmHg (MAP 104), HR 103 bpm  Addendum: pt without c/o dizziness and no nystagmus noted during session.    Follow Up Recommendations No PT follow up    Equipment Recommendations  None recommended by PT    Recommendations for Other Services       Precautions / Restrictions Precautions Precautions: None Restrictions Weight Bearing Restrictions: No      Mobility  Bed Mobility Overal bed mobility: Modified Independent             General bed mobility comments: supine>sit with bed flat    Transfers Overall transfer level: Independent Equipment used: None             General transfer comment: sit<>stand independent  Ambulation/Gait Ambulation/Gait assistance: Independent Gait Distance (Feet): 320 Feet Assistive device: None Gait Pattern/deviations:  WFL(Within Functional Limits)        Stairs            Wheelchair Mobility    Modified Rankin (Stroke Patients Only)       Balance Overall balance assessment: Modified Independent                                           Pertinent Vitals/Pain Pain Assessment: No/denies pain    Home Living Family/patient expects to be discharged to:: Private residence Living Arrangements: Alone Available Help at Discharge: Friend(s) (reports his friend could stay a couple days if needed) Type of Home: Apartment Home Access: Stairs to enter Entrance Stairs-Rails: None Entrance Stairs-Number of Steps: 1 Home Layout: Multi-level Home Equipment: None      Prior Function           Comments: Reports independence without AD, denies falls, drives. Disabled since stroke a few years ago.     Hand Dominance   Dominant Hand: Right    Extremity/Trunk Assessment   Upper Extremity Assessment Upper Extremity Assessment:  (residual LUE weakness 2/2 hx of CVA)    Lower Extremity Assessment Lower Extremity Assessment: Overall WFL for tasks assessed       Communication   Communication: No difficulties  Cognition Arousal/Alertness: Awake/alert Behavior During Therapy: WFL for tasks assessed/performed Overall Cognitive Status: Within Functional Limits for tasks  assessed                                        General Comments General comments (skin integrity, edema, etc.): c/o SOB with gait but SpO2 >90% on room air, HR up to 137 bpm with gait - pt reports he typically doesn't walk this far & has hx of COPD    Exercises     Assessment/Plan    PT Assessment Patent does not need any further PT services  PT Problem List         PT Treatment Interventions      PT Goals (Current goals can be found in the Care Plan section)  Acute Rehab PT Goals Patient Stated Goal: none stated PT Goal Formulation: With patient Time For Goal Achievement:  06/19/21    Frequency     Barriers to discharge        Co-evaluation               AM-PAC PT "6 Clicks" Mobility  Outcome Measure Help needed turning from your back to your side while in a flat bed without using bedrails?: None Help needed moving from lying on your back to sitting on the side of a flat bed without using bedrails?: None Help needed moving to and from a bed to a chair (including a wheelchair)?: None Help needed standing up from a chair using your arms (e.g., wheelchair or bedside chair)?: None Help needed to walk in hospital room?: None Help needed climbing 3-5 steps with a railing? : None 6 Click Score: 24    End of Session Equipment Utilized During Treatment: Gait belt Activity Tolerance: Patient tolerated treatment well (limited by SOB) Patient left: in chair;with call bell/phone within reach Nurse Communication: Mobility status      Time: 1040-1058 PT Time Calculation (min) (ACUTE ONLY): 18 min   Charges:   PT Evaluation $PT Eval Low Complexity: 1 Low PT Treatments $Therapeutic Activity: 8-22 mins        Lavone Nian, PT, DPT 06/05/21, 11:07 AM   Waunita Schooner 06/05/2021, 11:04 AM

## 2021-06-05 NOTE — Discharge Summary (Addendum)
[facility] Discharge Summary   Patient: [name] TFT:[id number]  PCP: [facility]  Date of admission: 06/03/2021   Date of discharge: 06/05/2021     Discharge Diagnoses:  Principal diagnosis Orthostatic hypotension with dizziness.  Active Problems:   Orthostatic hypotension   Admitted From: Home Disposition:  Home   Recommendations for Outpatient Follow-up:  PCP: Follow-up with PCP in 1 to 2 weeks   Follow-up [place], [facility]. Schedule an appointment as soon as possible for a visit in 3 day(s).   Why: Patient to [facility] own follow up appt, office closed at this time Contact information: [address] [place] [address] [phone]                Discharge Instructions     Diet - low sodium heart healthy   Complete by: As directed    Increase activity slowly   Complete by: As directed        Diet recommendation: Cardiac diet  Activity: The patient is advised to gradually reintroduce usual activities, as tolerated  Discharge Condition: stable  Code Status: Full code   History of present illness: As per the H and P dictated on admission, "[name] is a 60 y.o. male with medical history significant for CVA, hypertension and coronary artery disease, presented to the ER with near syncope.  The patient was getting out of his truck and walking when he experienced dizziness and lightheadedness but denies any falls.  He became diaphoretic and denied any chest pain or palpitations.  No nausea or vomiting or abdominal pain.  No fever or chills.  No dysuria, oliguria or hematuria or flank pain.  Denies any recent cough or wheezing or dyspnea.     ED Course: When he came to the ER vital signs were within normal and later blood pressure was 146/72.  He was orthostatic and after hydration with 2 L of crystalloids, 1 L of lactated ringer and 1 L of IV normal saline he was still orthostatic and was not able  to stand.   EKG as reviewed by me : Showed normal sinus rhythm with rate of 94 Imaging: Portable chest x-ray showed no acute cardiopulmonary disease.  Noncontrasted head CT scan showed chronic infarct in the right frontal lobe with no acute intracranial abnormalities."  Hospital Course:  Summary of his active problems in the hospital is as following.   1.  Near syncope secondary to orthostatic hypotension likely secondary to volume depletion and dehydration. Patient was given IV hydration with significant improvement in his dizziness. MRI brain negative for acute stroke. No further medication on discharge. Orthostasis resolved  2.  Essential hypertension. - Switch lisinopril to Norvasc due to renal dysfunction  3.  Stage IIIb chronic kidney disease. - This is currently stable. - significant improvement in his renal function after hydration. I will switch lisinopril to Norvasc  4.  Cognitive impairment. - We will continue Aricept.  5.  Dyslipidemia. - We will continue statin therapy.  6.  GERD. - We will continue PPI therapy.  7.  Microcytic anemia. No acute drop in the hemoglobin after stabilization. Dropped from 12-9.6 appears to be new but patient denies any acute bleeding history. Iron levels are relatively low therefore we will provided patient with plans for IV iron and will initiate oral iron therapy on discharge. Further work-up if the anemia remains an active issue for the patient.  Obesity. Placing the patient at high risk  for poor outcome.  Body mass index is 33.5 kg/m.    Nutrition Interventions:        Patient was seen by physical therapy, who recommended No therapy needed on discharge. On the day of the discharge the patient's vitals were stable, and no other new acute medical condition were reported. The patient was felt safe to be discharge at Home with no therapy needed on discharge.  Consultants: none Procedures: none   DISCHARGE  MEDICATION: Allergies as of 06/05/2021   No Known Allergies      Medication List     STOP taking these medications    lisinopril 20 MG tablet Commonly known as: ZESTRIL       TAKE these medications    albuterol 108 (90 Base) MCG/ACT inhaler Commonly known as: VENTOLIN HFA Inhale 2 puffs into the lungs every 6 (six) hours as needed for wheezing or shortness of breath.   amLODipine 5 MG tablet Commonly known as: NORVASC Take 1 tablet (5 mg total) by mouth daily.   aspirin 81 MG EC tablet Take 1 tablet (81 mg total) by mouth daily.   donepezil 10 MG tablet Commonly known as: ARICEPT Take 10 mg by mouth daily.   ferrous sulfate 325 (65 FE) MG tablet Take 1 tablet (325 mg total) by mouth daily.   metoprolol succinate 25 MG 24 hr tablet Commonly known as: TOPROL-XL Take 25 mg by mouth daily.   mirtazapine 15 MG tablet Commonly known as: REMERON Take 15 mg by mouth at bedtime.   omeprazole 20 MG capsule Commonly known as: PRILOSEC Take 1 capsule (20 mg total) by mouth 2 (two) times daily before a meal. What changed: when to take this   pravastatin 20 MG tablet Commonly known as: PRAVACHOL Take 20 mg by mouth at bedtime.   Trelegy Ellipta 100-62.5-25 MCG/INH Aepb Generic drug: Fluticasone-Umeclidin-Vilant Inhale 1 puff into the lungs daily.        Discharge Exam: Filed Weights   06/04/21 1339  Weight: 112 kg   Vitals:   06/05/21 0746 06/05/21 1156  BP: (!) 166/87 119/71  Pulse: 80 (!) 105  Resp: 16 16  Temp: 98.4 F (36.9 C) 98.7 F (37.1 C)  SpO2: 98% 98%   General: Appear in mild distress, no Rash; Oral Mucosa Clear, moist. no Abnormal Neck Mass Or lumps, Conjunctiva normal  Cardiovascular: S1 and S2 Present, no Murmur, Respiratory: good respiratory effort, Bilateral Air entry present and CTA, no Crackles, no wheezes Abdomen: Bowel Sound present, Soft and no tenderness Extremities: no Pedal edema Neurology: alert and oriented to time, place,  and person affect appropriate. no new focal deficit Gait not checked due to patient safety concerns    The results of significant diagnostics from this hospitalization (including imaging, microbiology, ancillary and laboratory) are listed below for reference.    Significant Diagnostic Studies: CT Head Wo Contrast  Result Date: 06/03/2021 CLINICAL DATA:  Seizure EXAM: CT HEAD WITHOUT CONTRAST TECHNIQUE: Contiguous axial images were obtained from the base of the skull through the vertex without intravenous contrast. COMPARISON:  CT head 05/31/2020 FINDINGS: Brain: Ventricle size normal. Mild cerebral volume loss. Chronic infarct right frontal lobe unchanged. Negative for acute infarct, hemorrhage, mass Vascular: Negative for hyperdense vessel Skull: Negative Sinuses/Orbits: Mild mucosal edema paranasal sinuses. Negative orbit Other: None IMPRESSION: No acute abnormality and no change from the prior study. Chronic infarct right frontal lobe. Electronically Signed   By: Franchot Gallo M.D.   On: 06/03/2021 17:24  MR BRAIN WO CONTRAST  Result Date: 06/04/2021 CLINICAL DATA:  60 year old male with recurrent syncope. Dizziness and diaphoresis. Possible seizure. EXAM: MRI HEAD WITHOUT CONTRAST TECHNIQUE: Multiplanar, multiecho pulse sequences of the brain and surrounding structures were obtained without intravenous contrast. COMPARISON:  Head CT 06/03/2021.  Brain MRI 01/07/2019. FINDINGS: Brain: No restricted diffusion to suggest acute infarction. No midline shift, mass effect, evidence of mass lesion, ventriculomegaly, extra-axial collection or acute intracranial hemorrhage. Cervicomedullary junction and pituitary are within normal limits. Mega cisterna magna and cavum septum pellucidum, normal variants. Stable chronic encephalomalacia in the posterior right frontal lobe, perirolandic cortex (series 15, image 34). Elsewhere stable and largely normal for age gray and white matter signal throughout the brain.  No convincing chronic cerebral blood products. Deep gray matter nuclei, brainstem and cerebellum appear negative. On thin slice coronal imaging the hippocampal formations and other mesial temporal lobe structures appear symmetric and within normal limits. Vascular: Major intracranial vascular flow voids are stable since 2020. Skull and upper cervical spine: Negative. Visualized bone marrow signal is within normal limits. Sinuses/Orbits: Orbits are stable and negative. Mild to moderate ethmoid sinus mucosal thickening has not significantly changed since 2020. Other: Mastoids remain clear. Visible internal auditory structures appear normal. IMPRESSION: 1. No acute intracranial abnormality. 2. Stable noncontrast MRI appearance of the brain since 2020 with a small chronic right MCA territory infarct. Electronically Signed   By: Genevie Ann M.D.   On: 06/04/2021 12:02   DG Chest Portable 1 View  Result Date: 06/03/2021 CLINICAL DATA:  Seizure, weakness EXAM: PORTABLE CHEST 1 VIEW COMPARISON:  07/24/2016 FINDINGS: Postop CABG. Negative for heart failure. Lungs clear without infiltrate or effusion. IMPRESSION: No active disease. Electronically Signed   By: Franchot Gallo M.D.   On: 06/03/2021 17:30    Microbiology: Recent Results (from the past 240 hour(s))  Resp Panel by RT-PCR (Flu A&B, Covid) Nasopharyngeal Swab     Status: None   Collection Time: 06/03/21  4:43 PM   Specimen: Nasopharyngeal Swab; Nasopharyngeal(NP) swabs in vial transport medium  Result Value Ref Range Status   SARS Coronavirus 2 by RT PCR NEGATIVE NEGATIVE Final    Comment: (NOTE) SARS-CoV-2 target nucleic acids are NOT DETECTED.  The SARS-CoV-2 RNA is generally detectable in upper respiratory specimens during the acute phase of infection. The lowest concentration of SARS-CoV-2 viral copies this assay can detect is 138 copies/mL. A negative result does not preclude SARS-Cov-2 infection and should not be used as the sole basis for  treatment or other patient management decisions. A negative result may occur with  improper specimen collection/handling, submission of specimen other than nasopharyngeal swab, presence of viral mutation(s) within the areas targeted by this assay, and inadequate number of viral copies(<138 copies/mL). A negative result must be combined with clinical observations, patient history, and epidemiological information. The expected result is Negative.  Fact Sheet for Patients:  EntrepreneurPulse.com.au  Fact Sheet for Healthcare Providers:  IncredibleEmployment.be  This test is no t yet approved or cleared by the [place] FDA and  has been authorized for detection and/or diagnosis of SARS-CoV-2 by FDA under an Emergency Use Authorization (EUA). This EUA will remain  in effect (meaning this test can be used) for the duration of the COVID-19 declaration under Section 564(b)(1) of the Act, 21 U.S.C.section 360bbb-3(b)(1), unless the authorization is terminated  or revoked sooner.       Influenza A by PCR NEGATIVE NEGATIVE Final   Influenza B by PCR NEGATIVE NEGATIVE Final  Comment: (NOTE) The Xpert Xpress SARS-CoV-2/FLU/RSV plus assay is intended as an aid in the diagnosis of influenza from Nasopharyngeal swab specimens and should not be used as a sole basis for treatment. Nasal washings and aspirates are unacceptable for Xpert Xpress SARS-CoV-2/FLU/RSV testing.  Fact Sheet for Patients: EntrepreneurPulse.com.au  Fact Sheet for Healthcare Providers: IncredibleEmployment.be  This test is not yet approved or cleared by the [place] FDA and has been authorized for detection and/or diagnosis of SARS-CoV-2 by FDA under an Emergency Use Authorization (EUA). This EUA will remain in effect (meaning this test can be used) for the duration of the COVID-19 declaration under Section 564(b)(1) of the Act, 21  U.S.C. section 360bbb-3(b)(1), unless the authorization is terminated or revoked.  Performed at [facility], [address] [place],  [address]      Labs: CBC: Recent Labs  Lab 06/03/21 1633 06/04/21 0613 06/05/21 0450  WBC 12.0* 9.4 9.4  NEUTROABS  --   --  5.7  HGB 9.6* 8.3* 8.7*  HCT 30.9* 26.2* 28.8*  MCV 78.6* 78.2* 79.8*  PLT 310 224 902   Basic Metabolic Panel: Recent Labs  Lab 06/03/21 1633 06/04/21 0613 06/05/21 0450  NA 131* 131* 134*  K 4.7 4.3 3.9  CL 95* 97* 100  CO2 23 27 27   GLUCOSE 162* 101* 94  BUN 24* 27* 16  CREATININE 1.95* 1.59* 1.16  CALCIUM 9.0 8.6* 8.7*  MG  --   --  1.8   Liver Function Tests: Recent Labs  Lab 06/03/21 1633 06/05/21 0450  AST 70* 65*  ALT 60* 54*  ALKPHOS 49 40  BILITOT 2.7* 1.3*  PROT 9.0* 7.4  ALBUMIN 4.2 3.4*   CBG: Recent Labs  Lab 06/03/21 1642  GLUCAP 170*    Time spent: 35 minutes  Signed:  Berle Mull  [facility] 06/05/2021 5:53 PM

## 2021-06-05 NOTE — Progress Notes (Signed)
Discharge instructions reviewed with patient-saline lock removed-waiting for neighbor to come pick him up.

## 2021-12-29 ENCOUNTER — Other Ambulatory Visit: Payer: Self-pay | Admitting: Specialist

## 2021-12-29 DIAGNOSIS — J449 Chronic obstructive pulmonary disease, unspecified: Secondary | ICD-10-CM

## 2021-12-29 DIAGNOSIS — Z9981 Dependence on supplemental oxygen: Secondary | ICD-10-CM

## 2021-12-29 DIAGNOSIS — R0609 Other forms of dyspnea: Secondary | ICD-10-CM

## 2022-01-17 ENCOUNTER — Telehealth: Payer: Self-pay | Admitting: Gastroenterology

## 2022-01-17 ENCOUNTER — Other Ambulatory Visit: Payer: Self-pay

## 2022-01-17 DIAGNOSIS — R195 Other fecal abnormalities: Secondary | ICD-10-CM

## 2022-01-17 MED ORDER — NA SULFATE-K SULFATE-MG SULF 17.5-3.13-1.6 GM/177ML PO SOLN: 1.0000 | Freq: Once | ORAL | 0 refills | Status: AC

## 2022-01-17 NOTE — Progress Notes (Signed)
Gastroenterology Pre-Procedure Review  Request Date: 02/09/2022 Requesting Physician: Dr. Allen Norris  PATIENT REVIEW QUESTIONS: The patient responded to the following health history questions as indicated:    1. Are you having any GI issues? no 2. Do you have a personal history of Polyps? no 3. Do you have a family history of Colon Cancer or Polyps? no 4. Diabetes Mellitus? no 5. Joint replacements in the past 12 months?no 6. Major health problems in the past 3 months?no 7. Any artificial heart valves, MVP, or defibrillator?no    MEDICATIONS & ALLERGIES:    Patient reports the following regarding taking any anticoagulation/antiplatelet therapy:   Plavix, Coumadin, Eliquis, Xarelto, Lovenox, Pradaxa, Brilinta, or Effient? no Aspirin? yes (81 mg asprin)  Patient confirms/reports the following medications:  Current Outpatient Medications  Medication Sig Dispense Refill   albuterol (VENTOLIN HFA) 108 (90 Base) MCG/ACT inhaler Inhale 2 puffs into the lungs every 6 (six) hours as needed for wheezing or shortness of breath.      amLODipine (NORVASC) 5 MG tablet Take 1 tablet (5 mg total) by mouth daily. 30 tablet 0   aspirin EC 81 MG EC tablet Take 1 tablet (81 mg total) by mouth daily. 30 tablet 2   donepezil (ARICEPT) 10 MG tablet Take 10 mg by mouth daily.      ferrous sulfate 325 (65 FE) MG tablet Take 1 tablet (325 mg total) by mouth daily. 30 tablet 3   metoprolol succinate (TOPROL-XL) 25 MG 24 hr tablet Take 25 mg by mouth daily.     mirtazapine (REMERON) 15 MG tablet Take 15 mg by mouth at bedtime.     omeprazole (PRILOSEC) 20 MG capsule Take 1 capsule (20 mg total) by mouth 2 (two) times daily before a meal. 60 capsule 0   pravastatin (PRAVACHOL) 20 MG tablet Take 20 mg by mouth at bedtime.      TRELEGY ELLIPTA 100-62.5-25 MCG/INH AEPB Inhale 1 puff into the lungs daily.     vitamin B-12 (CYANOCOBALAMIN) 1000 MCG tablet Take 1 tablet (1,000 mcg total) by mouth daily. 30 tablet 0   No  current facility-administered medications for this visit.    Patient confirms/reports the following allergies:  No Known Allergies  No orders of the defined types were placed in this encounter.   AUTHORIZATION INFORMATION Primary Insurance: 1D#: Group #:  Secondary Insurance: 1D#: Group #:  SCHEDULE INFORMATION: Date: 02/09/2022 Time: Location:[facility]

## 2022-01-17 NOTE — Telephone Encounter (Signed)
Pt's PCP called to schedule colonoscopy.

## 2022-02-08 ENCOUNTER — Encounter: Payer: Self-pay | Admitting: Gastroenterology

## 2022-02-09 ENCOUNTER — Ambulatory Visit
Admission: RE | Admit: 2022-02-09 | Discharge: 2022-02-09 | Disposition: A | Discharge status: Home / Self Care | Payer: Medicare Other | Attending: Gastroenterology | Admitting: Gastroenterology

## 2022-02-09 ENCOUNTER — Ambulatory Visit: Payer: Medicare Other | Admitting: Anesthesiology

## 2022-02-09 ENCOUNTER — Encounter
Admission: RE | Disposition: A | Discharge status: Home / Self Care | Payer: Self-pay | Source: Home / Self Care | Attending: Gastroenterology

## 2022-02-09 ENCOUNTER — Encounter: Payer: Self-pay | Admitting: Gastroenterology

## 2022-02-09 DIAGNOSIS — D123 Benign neoplasm of transverse colon: Secondary | ICD-10-CM | POA: Insufficient documentation

## 2022-02-09 DIAGNOSIS — I350 Nonrheumatic aortic (valve) stenosis: Secondary | ICD-10-CM | POA: Diagnosis not present

## 2022-02-09 DIAGNOSIS — K640 First degree hemorrhoids: Secondary | ICD-10-CM | POA: Insufficient documentation

## 2022-02-09 DIAGNOSIS — K635 Polyp of colon: Secondary | ICD-10-CM

## 2022-02-09 DIAGNOSIS — N183 Chronic kidney disease, stage 3 unspecified: Secondary | ICD-10-CM | POA: Insufficient documentation

## 2022-02-09 DIAGNOSIS — Z1211 Encounter for screening for malignant neoplasm of colon: Secondary | ICD-10-CM | POA: Diagnosis present

## 2022-02-09 DIAGNOSIS — D125 Benign neoplasm of sigmoid colon: Secondary | ICD-10-CM | POA: Diagnosis not present

## 2022-02-09 DIAGNOSIS — I129 Hypertensive chronic kidney disease with stage 1 through stage 4 chronic kidney disease, or unspecified chronic kidney disease: Secondary | ICD-10-CM | POA: Diagnosis not present

## 2022-02-09 DIAGNOSIS — Z9981 Dependence on supplemental oxygen: Secondary | ICD-10-CM | POA: Insufficient documentation

## 2022-02-09 DIAGNOSIS — F17220 Nicotine dependence, chewing tobacco, uncomplicated: Secondary | ICD-10-CM | POA: Insufficient documentation

## 2022-02-09 DIAGNOSIS — I6523 Occlusion and stenosis of bilateral carotid arteries: Secondary | ICD-10-CM | POA: Insufficient documentation

## 2022-02-09 DIAGNOSIS — I69954 Hemiplegia and hemiparesis following unspecified cerebrovascular disease affecting left non-dominant side: Secondary | ICD-10-CM | POA: Insufficient documentation

## 2022-02-09 DIAGNOSIS — J449 Chronic obstructive pulmonary disease, unspecified: Secondary | ICD-10-CM | POA: Diagnosis not present

## 2022-02-09 DIAGNOSIS — D124 Benign neoplasm of descending colon: Secondary | ICD-10-CM | POA: Diagnosis not present

## 2022-02-09 DIAGNOSIS — R195 Other fecal abnormalities: Secondary | ICD-10-CM

## 2022-02-09 DIAGNOSIS — I2510 Atherosclerotic heart disease of native coronary artery without angina pectoris: Secondary | ICD-10-CM | POA: Insufficient documentation

## 2022-02-09 DIAGNOSIS — Z951 Presence of aortocoronary bypass graft: Secondary | ICD-10-CM | POA: Diagnosis not present

## 2022-02-09 DIAGNOSIS — I252 Old myocardial infarction: Secondary | ICD-10-CM | POA: Insufficient documentation

## 2022-02-09 HISTORY — PX: COLONOSCOPY WITH PROPOFOL: SHX5780

## 2022-02-09 HISTORY — DX: Chronic obstructive pulmonary disease, unspecified: J44.9

## 2022-02-09 SURGERY — COLONOSCOPY WITH PROPOFOL
Anesthesia: General

## 2022-02-09 MED ORDER — SODIUM CHLORIDE 0.9 % IV SOLN: INTRAVENOUS | Status: DC

## 2022-02-09 MED ORDER — PROPOFOL 500 MG/50ML IV EMUL
INTRAVENOUS | Status: DC | PRN
  Administered 2022-02-09: 140 ug/kg/min via INTRAVENOUS

## 2022-02-09 MED ORDER — PROPOFOL 10 MG/ML IV BOLUS
INTRAVENOUS | Status: DC | PRN
  Administered 2022-02-09: 80 mg via INTRAVENOUS

## 2022-02-09 MED ORDER — ONDANSETRON HCL 4 MG/2ML IJ SOLN
INTRAMUSCULAR | Status: DC | PRN
  Administered 2022-02-09: 4 mg via INTRAVENOUS

## 2022-02-09 NOTE — OR Nursing (Signed)
Pt c/o nauseau , zofran adm with releif.

## 2022-02-09 NOTE — Op Note (Signed)
[facility] Gastroenterology Patient Name: [name] Procedure Date: 02/09/2022 10:04 AM MRN: [id number] Account #: [id number] Date of Birth: 11/05/1961 Admit Type: Outpatient Age: 61 Room: [facility] ENDO ROOM 4 Gender: Male Note Status: Finalized Instrument Name: Jasper Riling [id number] Procedure:             Colonoscopy Indications:           Screening for colorectal malignant neoplasm Providers:             Lucilla Lame MD, MD Referring MD:          [facility] Medicines:             Propofol per Anesthesia Complications:         No immediate complications. Procedure:             Pre-Anesthesia Assessment:                        - Prior to the procedure, a History and Physical was                         performed, and patient medications and allergies were                         reviewed. The patient's tolerance of previous                         anesthesia was also reviewed. The risks and benefits                         of the procedure and the sedation options and risks                         were discussed with the patient. All questions were                         answered, and informed consent was obtained. Prior                         Anticoagulants: The patient has taken no previous                         anticoagulant or antiplatelet agents. ASA Grade                         Assessment: II - A patient with mild systemic disease.                         After reviewing the risks and benefits, the patient                         was deemed in satisfactory condition to undergo the                         procedure.                        After obtaining informed consent, the colonoscope was  passed under direct vision. Throughout the procedure,                         the patient's blood pressure, pulse, and oxygen                         saturations were monitored continuously. The                          Colonoscope was introduced through the anus and                         advanced to the the cecum, identified by appendiceal                         orifice and ileocecal valve. The colonoscopy was                         performed without difficulty. The patient tolerated                         the procedure well. The quality of the bowel                         preparation was excellent. Findings:      The perianal and digital rectal examinations were normal.      A 5 mm polyp was found in the sigmoid colon. The polyp was sessile. The       polyp was removed with a cold snare. Resection and retrieval were       complete.      A 6 mm polyp was found in the transverse colon. The polyp was sessile.       The polyp was removed with a cold snare. Resection and retrieval were       complete.      A 6 mm polyp was found in the descending colon. The polyp was sessile.       The polyp was removed with a cold snare. Resection and retrieval were       complete.      Non-bleeding internal hemorrhoids were found during retroflexion. The       hemorrhoids were Grade I (internal hemorrhoids that do not prolapse). Impression:            - One 5 mm polyp in the sigmoid colon, removed with a                         cold snare. Resected and retrieved.                        - One 6 mm polyp in the transverse colon, removed with                         a cold snare. Resected and retrieved.                        - One 6 mm polyp in the descending colon, removed with  a cold snare. Resected and retrieved.                        - Non-bleeding internal hemorrhoids. Recommendation:        - Discharge patient to home.                        - Resume previous diet.                        - Continue present medications.                        - Await pathology results.                        - If the pathology report reveals adenomatous tissue,                         then repeat the  colonoscopy for surveillance in 5                         years. Procedure Code(s):     --- Professional ---                        [phone], Colonoscopy, flexible; with removal of                         tumor(s), polyp(s), or other lesion(s) by snare                         technique Diagnosis Code(s):     --- Professional ---                        Z12.11, Encounter for screening for malignant neoplasm                         of colon                        K63.5, Polyp of colon CPT copyright 2019 American Medical Association. All rights reserved. The codes documented in this report are preliminary and upon coder review may  be revised to meet current compliance requirements. Lucilla Lame MD, MD 02/09/2022 10:34:44 AM This report has been signed electronically. Number of Addenda: 0 Note Initiated On: 02/09/2022 10:04 AM Scope Withdrawal Time: 0 hours 6 minutes 21 seconds  Total Procedure Duration: 0 hours 11 minutes 36 seconds  Estimated Blood Loss:  Estimated blood loss: none.      [facility]

## 2022-02-09 NOTE — Anesthesia Preprocedure Evaluation (Addendum)
Anesthesia Evaluation  Patient identified by MRN, date of birth, ID band Patient awake    Reviewed: Allergy & Precautions, NPO status , Patient's Chart, lab work & pertinent test results  History of Anesthesia Complications Negative for: history of anesthetic complications  Airway Mallampati: I   Neck ROM: Full    Dental  (+) Edentulous Upper, Edentulous Lower   Pulmonary COPD (on home O2 PRN), former smoker (quit 2020),    Pulmonary exam normal breath sounds clear to auscultation       Cardiovascular hypertension, + CAD (s/p MI and CABG) and + Peripheral Vascular Disease (bilateral carotid stenosis s/p CEA)  Normal cardiovascular exam+ Valvular Problems/Murmurs AS  Rhythm:Regular Rate:Normal  ECG 06/06/21: normal  Myocardial perfusion 08/11/19:  LVEF= 60%   Regional wall motion:  Normal function  The overall quality of the study is fair.   Artifacts noted: no  Left ventricular cavity: normal.  Perfusion Analysis: SPECT images demonstrate small perfusion abnormality of mild intensity is present in the apical myocardial region on the stress images. Resting images show minimal apical myocardial perfusion defect consistent with previous infarct and/or scar without evidence of myocardial ischemia defect type : Fixed  Echo 08/11/19:  NORMAL LEFT VENTRICULAR SYSTOLIC FUNCTION  WITH MILD LVH  NORMAL RIGHT VENTRICULAR SYSTOLIC FUNCTION  TRIVIAL REGURGITATION NOTED  MILD AORTIC VALVE STENOSIS WITH A CALCULATED AVA = 1.2 cm^2 BY CONTINUITY EQUATION  TECHNICALLY DIFFICULT STUDY  POOR SOUND TRANSMISSION  LIMITED VIEWS    Neuro/Psych  Headaches, Chewing tobacco use CVA (2012, residual left hand numbness)    GI/Hepatic negative GI ROS,   Endo/Other  Obesity   Renal/GU Renal disease (stage III CKD)     Musculoskeletal   Abdominal   Peds  Hematology negative hematology ROS (+)   Anesthesia Other Findings Reviewed  12/08/21 cardiology note.  Reproductive/Obstetrics                            Anesthesia Physical Anesthesia Plan  ASA: 4  Anesthesia Plan: General   Post-op Pain Management:    Induction: Intravenous  PONV Risk Score and Plan: 2 and Propofol infusion, TIVA and Treatment may vary due to age or medical condition  Airway Management Planned: Natural Airway  Additional Equipment:   Intra-op Plan:   Post-operative Plan:   Informed Consent: I have reviewed the patients History and Physical, chart, labs and discussed the procedure including the risks, benefits and alternatives for the proposed anesthesia with the patient or authorized representative who has indicated his/her understanding and acceptance.       Plan Discussed with: CRNA  Anesthesia Plan Comments: (LMA/GETA backup discussed.  Patient consented for risks of anesthesia including but not limited to:  - adverse reactions to medications - damage to eyes, teeth, lips or other oral mucosa - nerve damage due to positioning  - sore throat or hoarseness - damage to heart, brain, nerves, lungs, other parts of body or loss of life  Informed patient about role of CRNA in peri- and intra-operative care.  Patient voiced understanding.)       Anesthesia Quick Evaluation

## 2022-02-09 NOTE — H&P (Signed)
Lucilla Lame, MD [facility] [address], [place], [place] [address] Phone:[phone] Fax : [phone]  Primary Care Physician:  [facility] Primary Gastroenterologist:  Dr. Allen Norris  Pre-Procedure History & Physical: HPI:  [name] is a 61 y.o. male is here for an colonoscopy.   Past Medical History:  Diagnosis Date   COPD (chronic obstructive pulmonary disease) (HCC)    CVA (cerebral infarction)    Headache    mild, since stroke 2012   Hypertension    MI (myocardial infarction) ([place]) 05/09/2012   Reading difficulty    pt reports he reads at "about a second grade level"   Stroke [facility]) 2012   numbness to left hand    Wears dentures    full upper and lower    Past Surgical History:  Procedure Laterality Date   CAROTID ENDARTERECTOMY Left    2012 or 2013   CHOLECYSTECTOMY N/A 07/26/2016   Procedure: LAPAROSCOPIC CHOLECYSTECTOMY;  Surgeon: Florene Glen, MD;  Location: [facility] ORS;  Service: General;  Laterality: N/A;   CORONARY ARTERY BYPASS GRAFT  05/10/2012   3 vessel    Prior to Admission medications   Medication Sig Start Date End Date Taking? Authorizing Provider  albuterol (VENTOLIN HFA) 108 (90 Base) MCG/ACT inhaler Inhale 2 puffs into the lungs every 6 (six) hours as needed for wheezing or shortness of breath.    Yes [provider]  amLODipine (NORVASC) 5 MG tablet Take 1 tablet (5 mg total) by mouth daily. 06/05/21 06/05/22 Yes Lavina Hamman, MD  aspirin EC 81 MG EC tablet Take 1 tablet (81 mg total) by mouth daily. 07/27/16  Yes Vaughan Basta, MD  donepezil (ARICEPT) 10 MG tablet Take 10 mg by mouth daily.    Yes [provider]  ferrous sulfate 325 (65 FE) MG tablet Take 1 tablet (325 mg total) by mouth daily. 06/05/21 06/05/22 Yes Lavina Hamman, MD  metoprolol succinate (TOPROL-XL) 25 MG 24 hr tablet Take 25 mg by mouth daily. 05/24/21  Yes [provider]  mirtazapine (REMERON) 15 MG tablet Take  15 mg by mouth at bedtime. 05/24/21  Yes [provider]  omeprazole (PRILOSEC) 20 MG capsule Take 1 capsule (20 mg total) by mouth 2 (two) times daily before a meal. 06/05/21  Yes Lavina Hamman, MD  pravastatin (PRAVACHOL) 20 MG tablet Take 20 mg by mouth at bedtime.    Yes [provider]  TRELEGY ELLIPTA 100-62.5-25 MCG/INH AEPB Inhale 1 puff into the lungs daily. 05/24/21  Yes [provider]  vitamin B-12 (CYANOCOBALAMIN) 1000 MCG tablet Take 1 tablet (1,000 mcg total) by mouth daily. 06/05/21  Yes Lavina Hamman, MD    Allergies as of 01/17/2022 - Review Complete 06/04/2021  Allergen Reaction Noted   Other  01/17/2022    History reviewed. No pertinent family history.  Social History   Socioeconomic History   Marital status: Divorced    Spouse name: Not on file   Number of children: Not on file   Years of education: Not on file   Highest education level: Not on file  Occupational History   Not on file  Tobacco Use   Smoking status: Former    Packs/day: 2.00    Years: 43.00    Pack years: 86.00    Types: Cigarettes    Quit date: 11/2019    Years since quitting: 2.2   Smokeless tobacco: Current    Types: Snuff   Tobacco comments:  changed to dip  Vaping Use   Vaping Use: Never used  Substance and Sexual Activity   Alcohol use: Yes    Alcohol/week: 12.0 standard drinks    Types: 12 Cans of beer per week   Drug use: No   Sexual activity: Yes    Birth control/protection: None  Other Topics Concern   Not on file  Social History Narrative   Not on file   Social Determinants of Health   Financial Resource Strain: Not on file  Food Insecurity: Not on file  Transportation Needs: Not on file  Physical Activity: Not on file  Stress: Not on file  Social Connections: Not on file  Intimate Partner Violence: Not on file    Review of Systems: See HPI, otherwise negative ROS  Physical Exam: BP (!) 152/93    Pulse 88    Temp (!) 97.5 F  (36.4 C) (Temporal)    Resp 17    Ht 6' (1.829 m)    Wt 113.4 kg    SpO2 97%    BMI 33.91 kg/m  General:   Alert,  pleasant and cooperative in NAD Head:  Normocephalic and atraumatic. Neck:  Supple; no masses or thyromegaly. Lungs:  Clear throughout to auscultation.    Heart:  Regular rate and rhythm. Abdomen:  Soft, nontender and nondistended. Normal bowel sounds, without guarding, and without rebound.   Neurologic:  Alert and  oriented x4;  grossly normal neurologically.  Impression/Plan: [name] is here for an colonoscopy to be performed for positive cologaurd  Risks, benefits, limitations, and alternatives regarding  colonoscopy have been reviewed with the patient.  Questions have been answered.  All parties agreeable.   Lucilla Lame, MD  02/09/2022, 10:06 AM

## 2022-02-09 NOTE — Transfer of Care (Signed)
Immediate Anesthesia Transfer of Care Note  Patient: [name]  Procedure(s) Performed: COLONOSCOPY WITH PROPOFOL  Patient Location: PACU  Anesthesia Type:General  Level of Consciousness: awake, alert  and oriented  Airway & Oxygen Therapy: Patient Spontanous Breathing  Post-op Assessment: Report given to RN and Post -op Vital signs reviewed and stable  Post vital signs: Reviewed and stable  Last Vitals:  Vitals Value Taken Time  BP 163/93 02/09/22 1040  Temp    Pulse 94 02/09/22 1040  Resp 19 02/09/22 1040  SpO2 97 % 02/09/22 1040  Vitals shown include unvalidated device data.  Last Pain:  Vitals:   02/09/22 1030  TempSrc: Temporal  PainSc:          Complications: No notable events documented.

## 2022-02-09 NOTE — Anesthesia Postprocedure Evaluation (Signed)
Anesthesia Post Note  Patient: Dayvon Dax  Procedure(s) Performed: COLONOSCOPY WITH PROPOFOL  Patient location during evaluation: PACU Anesthesia Type: General Level of consciousness: awake and alert, oriented and patient cooperative Pain management: pain level controlled Vital Signs Assessment: post-procedure vital signs reviewed and stable Respiratory status: spontaneous breathing, nonlabored ventilation and respiratory function stable Cardiovascular status: blood pressure returned to baseline and stable Postop Assessment: adequate PO intake Anesthetic complications: no   No notable events documented.   Last Vitals:  Vitals:   02/09/22 1050 02/09/22 1100  BP: (!) 165/89 (!) 167/97  Pulse: 78 73  Resp: 17 14  Temp:    SpO2: 97% 98%    Last Pain:  Vitals:   02/09/22 1030  TempSrc: Temporal  PainSc:                  Darrin Nipper

## 2022-02-10 ENCOUNTER — Encounter: Payer: Self-pay | Admitting: Gastroenterology

## 2022-02-10 LAB — SURGICAL PATHOLOGY

## 2022-03-01 ENCOUNTER — Ambulatory Visit: Payer: Medicaid Other

## 2022-08-14 ENCOUNTER — Ambulatory Visit (INDEPENDENT_AMBULATORY_CARE_PROVIDER_SITE_OTHER): Payer: Medicare Other | Admitting: Student in an Organized Health Care Education/Training Program

## 2022-08-14 ENCOUNTER — Encounter: Payer: Self-pay | Admitting: Student in an Organized Health Care Education/Training Program

## 2022-08-14 ENCOUNTER — Other Ambulatory Visit
Admission: RE | Admit: 2022-08-14 | Discharge: 2022-08-14 | Disposition: A | Discharge status: Home / Self Care | Payer: Medicare Other | Source: Ambulatory Visit | Attending: Student in an Organized Health Care Education/Training Program | Admitting: Student in an Organized Health Care Education/Training Program

## 2022-08-14 VITALS — BP 140/78 | HR 89 | Temp 97.8°F | Ht 72.0 in | Wt 249.0 lb

## 2022-08-14 DIAGNOSIS — R0609 Other forms of dyspnea: Secondary | ICD-10-CM

## 2022-08-14 DIAGNOSIS — J449 Chronic obstructive pulmonary disease, unspecified: Secondary | ICD-10-CM

## 2022-08-14 DIAGNOSIS — Z23 Encounter for immunization: Secondary | ICD-10-CM

## 2022-08-14 LAB — COMPREHENSIVE METABOLIC PANEL
ALT: 22 U/L (ref 0–44)
AST: 34 U/L (ref 15–41)
Albumin: 3.5 g/dL (ref 3.5–5.0)
Alkaline Phosphatase: 55 U/L (ref 38–126)
Anion gap: 8 (ref 5–15)
BUN: 9 mg/dL (ref 8–23)
CO2: 27 mmol/L (ref 22–32)
Calcium: 9 mg/dL (ref 8.9–10.3)
Chloride: 101 mmol/L (ref 98–111)
Creatinine, Ser: 0.97 mg/dL (ref 0.61–1.24)
GFR, Estimated: >60 mL/min (ref >60)
Glucose, Bld: 159 mg/dL — ABNORMAL HIGH (ref 70–99)
Potassium: 4 mmol/L (ref 3.5–5.1)
Sodium: 136 mmol/L (ref 135–145)
Total Bilirubin: 1.1 mg/dL (ref 0.3–1.2)
Total Protein: 8.6 g/dL — ABNORMAL HIGH (ref 6.5–8.1)

## 2022-08-14 LAB — CBC WITH DIFFERENTIAL/PLATELET
Abs Immature Granulocytes: 0.03 10*3/uL (ref 0.00–0.07)
Basophils Absolute: 0.2 10*3/uL — ABNORMAL HIGH (ref 0.0–0.1)
Basophils Relative: 1 %
Eosinophils Absolute: 0.3 10*3/uL (ref 0.0–0.5)
Eosinophils Relative: 3 %
HCT: 31 % — ABNORMAL LOW (ref 39.0–52.0)
Hemoglobin: 9.6 g/dL — ABNORMAL LOW (ref 13.0–17.0)
Immature Granulocytes: 0 %
Lymphocytes Relative: 15 %
Lymphs Abs: 1.6 10*3/uL (ref 0.7–4.0)
MCH: 27.7 pg (ref 26.0–34.0)
MCHC: 31 g/dL (ref 30.0–36.0)
MCV: 89.3 fL (ref 80.0–100.0)
Monocytes Absolute: 1.5 10*3/uL — ABNORMAL HIGH (ref 0.1–1.0)
Monocytes Relative: 14 %
Neutro Abs: 6.9 10*3/uL (ref 1.7–7.7)
Neutrophils Relative %: 67 %
Platelets: 369 10*3/uL (ref 150–400)
RBC: 3.47 MIL/uL — ABNORMAL LOW (ref 4.22–5.81)
RDW: 13.8 % (ref 11.5–15.5)
WBC: 10.4 10*3/uL (ref 4.0–10.5)
nRBC: 0 % (ref 0.0–0.2)

## 2022-08-14 MED ORDER — ALBUTEROL SULFATE HFA 108 (90 BASE) MCG/ACT IN AERS: 2.0000 | INHALATION_SPRAY | RESPIRATORY_TRACT | 11 refills | Status: DC | PRN

## 2022-08-14 NOTE — Progress Notes (Addendum)
Synopsis: Referred in for shortness of breath by Margaretha Sheffield, MD  Assessment & Plan:   1. Dyspnea on exertion  Presents for the evaluation of dyspnea on exertion and is requesting a second opinion. I suspect his shortness of breath is secondary to his severe COPD, which I will re-assess and confirm with PFT's. He does tell me his shortness of breath is worse laying down and has some stigmata of liver disease (enlarged liver span, spider angioma, lower extremity edema) and I will order a TTE with bubble study to rule out hepatopulmonary syndrome. Furthermore, he is known to have aortic stenosis and has a murmur consistent with that on exam. The TTE will also allow re-evaluation of the degree of his stenosis. I will obtain an [place] of his liver as well as a duplex of his lower extremities. Finally, given the dilated vessels over his chest, I will rule out malignancy resulting in collaterals with a CT scan of the chest. This will also allow me to evaluate his lung parenchyma for emphysema and assess candidacy for lung volume reduction.  - Pulmonary Function Test [facility] Only; Future - CBC with Differential/Platelet; Future - Comprehensive metabolic panel; Future - ECHOCARDIOGRAM COMPLETE BUBBLE STUDY; Future - US Abdomen Limited; Future - US Venous Img Lower Bilateral (DVT); Future - albuterol (VENTOLIN HFA) 108 (90 Base) MCG/ACT inhaler; Inhale 2 puffs into the lungs every 4 (four) hours as needed for wheezing or shortness of breath.  Dispense: 2 each; Refill: 11 - CT CHEST WO CONTRAST; Future  2. Chronic obstructive pulmonary disease, unspecified COPD type ([place])  History of COPD maintained on Trelegy. He will continue with Trelegy as prescribed with one puff once daily. I will re-assess his degree of obstruction with a TTE. I will also obtain a CT of the chest to evaluate for emphysema. I have prescribed him an albuterol to be used as needed. Finally, I have recommended pneumococcal vaccination  with [id number] today. He will get his flue vaccine at the local pharmacy. He has had his COVID vaccines.  - Pulmonary Function Test [facility] Only; Future - albuterol (VENTOLIN HFA) 108 (90 Base) MCG/ACT inhaler; Inhale 2 puffs into the lungs every 4 (four) hours as needed for wheezing or shortness of breath.  Dispense: 2 each; Refill: 11 - CT CHEST WO CONTRAST; Future - Pneumococcal conjugate vaccine 20-valent (Prevnar-20)  3. EtOH Use Disorder  Patient was counseled re need to abstain from alcohol. Explained risk of liver cirrhosis with continued alcohol use.   Return in about 2 months (around 10/14/2022).  I spent 60 minutes caring for this patient today, including preparing to see the patient, obtaining and/or reviewing separately obtained history, performing a medically appropriate examination and/or evaluation, counseling and educating the patient/family/caregiver, ordering medications, tests, or procedures, and documenting clinical information in the electronic health record  Armando Reichert, MD [facility] 08/14/2022 3:19 PM    End of visit medications:  Meds ordered this encounter  Medications   albuterol (VENTOLIN HFA) 108 (90 Base) MCG/ACT inhaler    Sig: Inhale 2 puffs into the lungs every 4 (four) hours as needed for wheezing or shortness of breath.    Dispense:  2 each    Refill:  11     Current Outpatient Medications:    albuterol (VENTOLIN HFA) 108 (90 Base) MCG/ACT inhaler, Inhale 2 puffs into the lungs every 4 (four) hours as needed for wheezing or shortness of breath., Disp: 2 each, Rfl: 11   aspirin  EC 81 MG EC tablet, Take 1 tablet (81 mg total) by mouth daily., Disp: 30 tablet, Rfl: 2   cetirizine (ZYRTEC) 10 MG tablet, Take 10 mg by mouth daily., Disp: , Rfl:    donepezil (ARICEPT) 10 MG tablet, Take 10 mg by mouth daily. , Disp: , Rfl:    fluticasone (FLONASE) 50 MCG/ACT nasal spray, Place 2 sprays into both nostrils daily., Disp: , Rfl:     Melatonin 10 MG CAPS, Take 1 capsule by mouth at bedtime., Disp: , Rfl:    metoprolol succinate (TOPROL-XL) 25 MG 24 hr tablet, Take 25 mg by mouth daily., Disp: , Rfl:    omeprazole (PRILOSEC) 20 MG capsule, Take 1 capsule (20 mg total) by mouth 2 (two) times daily before a meal., Disp: 60 capsule, Rfl: 0   pravastatin (PRAVACHOL) 20 MG tablet, Take 20 mg by mouth at bedtime. , Disp: , Rfl:    TRELEGY ELLIPTA 100-62.5-25 MCG/INH AEPB, Inhale 1 puff into the lungs daily., Disp: , Rfl:    vitamin B-12 (CYANOCOBALAMIN) 1000 MCG tablet, Take 1 tablet (1,000 mcg total) by mouth daily., Disp: 30 tablet, Rfl: 0   amLODipine (NORVASC) 5 MG tablet, Take 1 tablet (5 mg total) by mouth daily., Disp: 30 tablet, Rfl: 0   Subjective:   PATIENT ID: [name] GENDER: male DOB: 1961-02-01, MRN: [id number]  Chief Complaint  Patient presents with   pulmonary consult    Hx of COPD. C/o SOB with exertion.     HPI  Mr. Vera is a 61 year old male presenting to clinic for the evaluation of shortness of breath. He reports that he's had the symptoms for years and was diagnosed with COPD and started on Trelegy by Dr. Raul Del at [facility]. He would like a second opinion.  Besides reporting shortness of breath, he is unsure of his other symptoms. Specifically, he reports a cough but is unable to quantify it. He is not sure if it is productive of sputum and does not know if the cough is productive. He does not have any fevers or chills. He reports that his cough does at times get worse when he lays flat. He reports a rash over his anterior chest (see exam below) but doesn't endorse any other rashes. He is not sure if he has any chest pain or chest tightness. He does not recall any wheezing. He uses his Trelegy everyday - he did not wash his mouth after using it but he started to after he had an episode where he could not swallow. He is unsure what his medication list is. He was previously prescribed oxygen and uses  it with exertion.  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 He does not smoke, but does have a significant smoking history (quit in 1993, around 100+ pack years). He endorses smokeless tobacco (snuff) and endorses alcohol intake. He says he drinks around 4 days a week, drinking four (42 oz) cans of beer on the days he drink.   He had PFT's in 2021 in [place] read as: FVC 1.88 liters (38%, FEV1 0.87 LITERS (22%), ratio46, FEF 25-75 l/s 8 %, exp flow volume loop is delayed. IMP. Very severe obstruction by Dr. Raul Del. I don't have access to the flow volume loops.  Ancillary information including prior medications, full medical/surgical/family/social histories, and PFTs (when available) are listed below and have been reviewed.   Review of Systems  Constitutional:  Negative for  chills and fever.  Respiratory:  Positive for cough, sputum production (usnure), shortness of breath and wheezing (unsure).   Cardiovascular:  Positive for leg swelling.     Objective:   Vitals:   08/14/22 1438  BP: (!) 140/78  Pulse: 89  Temp: 97.8 F (36.6 C)  TempSrc: Temporal  SpO2: 98%  Weight: 249 lb (112.9 kg)  Height: 6' (1.829 m)   98% on RA BMI Readings from Last 3 Encounters:  08/14/22 33.77 kg/m  02/09/22 33.91 kg/m  06/04/21 33.50 kg/m   Wt Readings from Last 3 Encounters:  08/14/22 249 lb (112.9 kg)  02/09/22 250 lb (113.4 kg)  06/04/21 247 lb (112 kg)    Physical Exam Constitutional:      Appearance: He is normal weight.  HENT:     Head: Normocephalic.     Nose: Nose normal.     Mouth/Throat:     Mouth: Mucous membranes are moist.  Eyes:     Pupils: Pupils are equal, round, and reactive to light.  Cardiovascular:     Rate and Rhythm: Regular rhythm.     Heart sounds: Murmur (systolic ejection murmur heard throughout the pericordium, most notable over the right upper sternal border) heard.     Comments: Spider angioma noted over his  chest wall  Pulmonary:     Effort: Pulmonary effort is normal.     Breath sounds: Normal breath sounds. No wheezing or rales.  Abdominal:     General: There is distension (enlarged liver span, no splenomegaly appreciated. No ascites appreciated on palpation and percussion).     Palpations: Abdomen is soft.  Musculoskeletal:     Cervical back: Normal range of motion.     Right lower leg: Edema present.     Left lower leg: Edema present.     Comments: Bilateral pitting edema  Skin:    General: Skin is warm.     Coloration: Skin is not jaundiced.  Neurological:     General: No focal deficit present.     Mental Status: He is oriented to person, place, and time.     Comments: Patient noted to be slow to answer questions and uncertain about details pertaining to his health care.     Ancillary Information    Past Medical History:  Diagnosis Date   COPD (chronic obstructive pulmonary disease) (HCC)    CVA (cerebral infarction)    Headache    mild, since stroke 2012   Hypertension    MI (myocardial infarction) ([place]) 05/09/2012   Reading difficulty    pt reports he reads at "about a second grade level"   Stroke [facility]) 2012   numbness to left hand    Wears dentures    full upper and lower     Family History  Problem Relation Age of Onset   Pneumonia Mother      Past Surgical History:  Procedure Laterality Date   CAROTID ENDARTERECTOMY Left    2012 or 2013   CHOLECYSTECTOMY N/A 07/26/2016   Procedure: LAPAROSCOPIC CHOLECYSTECTOMY;  Surgeon: Florene Glen, MD;  Location: [facility] ORS;  Service: General;  Laterality: N/A;   COLONOSCOPY WITH PROPOFOL N/A 02/09/2022   Procedure: COLONOSCOPY WITH PROPOFOL;  Surgeon: Lucilla Lame, MD;  Location: [facility] ENDOSCOPY;  Service: Endoscopy;  Laterality: N/A;   CORONARY ARTERY BYPASS GRAFT  05/10/2012   3 vessel    Social History   Socioeconomic History   Marital status: Divorced    Spouse name: Not on file  Number of children: Not on  file   Years of education: Not on file   Highest education level: Not on file  Occupational History   Not on file  Tobacco Use   Smoking status: Former    Packs/day: 4.00    Years: 43.00    Total pack years: 172.00    Types: Cigarettes    Quit date: 44    Years since quitting: 30.6   Smokeless tobacco: Current    Types: Snuff   Tobacco comments:    changed to dip  Vaping Use   Vaping Use: Never used  Substance and Sexual Activity   Alcohol use: Yes    Alcohol/week: 12.0 standard drinks of alcohol    Types: 12 Cans of beer per week   Drug use: No   Sexual activity: Yes    Birth control/protection: None  Other Topics Concern   Not on file  Social History Narrative   Not on file   Social Determinants of Health   Financial Resource Strain: Not on file  Food Insecurity: Not on file  Transportation Needs: Not on file  Physical Activity: Not on file  Stress: Not on file  Social Connections: Not on file  Intimate Partner Violence: Not on file     Allergies  Allergen Reactions   Other     Welbutrin - has paranoia      CBC    Component Value Date/Time   WBC 9.4 06/05/2021 0450   RBC 3.41 (L) 06/05/2021 0456   RBC 3.61 (L) 06/05/2021 0450   HGB 8.7 (L) 06/05/2021 0450   HGB 16.2 12/02/2014 1332   HCT 28.8 (L) 06/05/2021 0450   HCT 49.0 12/02/2014 1332   PLT 252 06/05/2021 0450   PLT 196 12/02/2014 1332   MCV 79.8 (L) 06/05/2021 0450   MCV 100 12/02/2014 1332   MCH 24.1 (L) 06/05/2021 0450   MCHC 30.2 06/05/2021 0450   RDW 19.3 (H) 06/05/2021 0450   RDW 13.9 12/02/2014 1332   LYMPHSABS 2.0 06/05/2021 0450   LYMPHSABS 1.4 06/10/2013 1528   MONOABS 1.1 (H) 06/05/2021 0450   MONOABS 1.4 (H) 06/10/2013 1528   EOSABS 0.4 06/05/2021 0450   EOSABS 0.1 06/10/2013 1528   BASOSABS 0.1 06/05/2021 0450   BASOSABS 0.1 06/10/2013 1528    Pulmonary Functions Testing Results:     No data to display          Outpatient Medications Prior to Visit  Medication  Sig Dispense Refill   aspirin EC 81 MG EC tablet Take 1 tablet (81 mg total) by mouth daily. 30 tablet 2   cetirizine (ZYRTEC) 10 MG tablet Take 10 mg by mouth daily.     donepezil (ARICEPT) 10 MG tablet Take 10 mg by mouth daily.      fluticasone (FLONASE) 50 MCG/ACT nasal spray Place 2 sprays into both nostrils daily.     Melatonin 10 MG CAPS Take 1 capsule by mouth at bedtime.     metoprolol succinate (TOPROL-XL) 25 MG 24 hr tablet Take 25 mg by mouth daily.     omeprazole (PRILOSEC) 20 MG capsule Take 1 capsule (20 mg total) by mouth 2 (two) times daily before a meal. 60 capsule 0   pravastatin (PRAVACHOL) 20 MG tablet Take 20 mg by mouth at bedtime.      TRELEGY ELLIPTA 100-62.5-25 MCG/INH AEPB Inhale 1 puff into the lungs daily.     vitamin B-12 (CYANOCOBALAMIN) 1000 MCG tablet Take 1 tablet (1,000  mcg total) by mouth daily. 30 tablet 0   albuterol (VENTOLIN HFA) 108 (90 Base) MCG/ACT inhaler Inhale 2 puffs into the lungs every 4 (four) hours as needed for wheezing or shortness of breath.     amLODipine (NORVASC) 5 MG tablet Take 1 tablet (5 mg total) by mouth daily. 30 tablet 0   ferrous sulfate 325 (65 FE) MG tablet Take 1 tablet (325 mg total) by mouth daily. 30 tablet 3   mirtazapine (REMERON) 15 MG tablet Take 15 mg by mouth at bedtime.     No facility-administered medications prior to visit.

## 2022-08-14 NOTE — Patient Instructions (Signed)
Today, we discussed your shortness of breath. I have ordered a breathing test, an ultrasound of your heart, ultrasound of your liver, ultrasound of your legs, and blood work. I have also ordered a CT scan of your chest to work up your symptoms. I will see you in two months after the tests for follow up

## 2022-08-22 ENCOUNTER — Other Ambulatory Visit: Payer: Self-pay | Admitting: Student in an Organized Health Care Education/Training Program

## 2022-08-22 DIAGNOSIS — R0609 Other forms of dyspnea: Secondary | ICD-10-CM

## 2022-08-22 DIAGNOSIS — F1010 Alcohol abuse, uncomplicated: Secondary | ICD-10-CM

## 2022-08-22 DIAGNOSIS — J449 Chronic obstructive pulmonary disease, unspecified: Secondary | ICD-10-CM

## 2022-08-23 ENCOUNTER — Ambulatory Visit: Admission: RE | Admit: 2022-08-23 | Payer: Medicare Other | Source: Ambulatory Visit

## 2022-08-25 ENCOUNTER — Ambulatory Visit: Payer: Medicare Other

## 2022-08-25 ENCOUNTER — Ambulatory Visit: Admission: RE | Admit: 2022-08-25 | Payer: Medicare Other | Source: Ambulatory Visit

## 2022-08-30 ENCOUNTER — Encounter: Payer: Self-pay | Admitting: Student in an Organized Health Care Education/Training Program

## 2022-08-30 ENCOUNTER — Telehealth: Payer: Self-pay

## 2022-08-30 NOTE — Telephone Encounter (Signed)
ATC patient to request that he come in the morning of 10/04/2022 vs 2:00, due to Dr. Genia Harold having a procedure scheduled.  Received recording that call could not be completed.  Lm for patient's EC, James. Letter has been mailed to address on file. Will hold in triage to ensure f/u.

## 2022-08-31 NOTE — Telephone Encounter (Signed)
ATC mobile number on file x2-received recording that clal could not be completed at this time.

## 2022-09-04 NOTE — Telephone Encounter (Signed)
ATC patient on both number on file. Received recording that call could not be completed.  ATC EC number. Unknown male answered and stated that I had the incorrect number.  Will continue to f/u.

## 2022-09-11 NOTE — Telephone Encounter (Signed)
ATC patient. Received busy signal.  Will close encounter, as letter has been mailed to address on file.

## 2022-09-14 ENCOUNTER — Encounter: Payer: Self-pay | Admitting: Student in an Organized Health Care Education/Training Program

## 2022-10-04 ENCOUNTER — Ambulatory Visit: Payer: Medicare Other | Admitting: Student in an Organized Health Care Education/Training Program

## 2022-10-17 ENCOUNTER — Ambulatory Visit
Admission: RE | Admit: 2022-10-17 | Discharge: 2022-10-17 | Disposition: A | Discharge status: Home / Self Care | Payer: Medicare Other | Source: Ambulatory Visit | Attending: Student in an Organized Health Care Education/Training Program | Admitting: Student in an Organized Health Care Education/Training Program

## 2022-10-17 DIAGNOSIS — J449 Chronic obstructive pulmonary disease, unspecified: Secondary | ICD-10-CM | POA: Insufficient documentation

## 2022-10-17 DIAGNOSIS — Z951 Presence of aortocoronary bypass graft: Secondary | ICD-10-CM | POA: Diagnosis not present

## 2022-10-17 DIAGNOSIS — R0609 Other forms of dyspnea: Secondary | ICD-10-CM | POA: Insufficient documentation

## 2022-10-17 DIAGNOSIS — I080 Rheumatic disorders of both mitral and aortic valves: Secondary | ICD-10-CM | POA: Diagnosis not present

## 2022-10-17 DIAGNOSIS — Z8673 Personal history of transient ischemic attack (TIA), and cerebral infarction without residual deficits: Secondary | ICD-10-CM | POA: Insufficient documentation

## 2022-10-17 LAB — ECHOCARDIOGRAM COMPLETE BUBBLE STUDY
AR max vel: 1.32 cm2
AV Area VTI: 1.64 cm2
AV Area mean vel: 1.44 cm2
AV Mean grad: 28 mmHg
AV Peak grad: 51.6 mmHg
Ao pk vel: 3.59 m/s
Area-P 1/2: 4.29 cm2
S' Lateral: 3.1 cm

## 2022-10-17 NOTE — Progress Notes (Signed)
*  PRELIMINARY RESULTS* Echocardiogram 2D Echocardiogram has been performed.  [name] 10/17/2022, 11:53 AM

## 2022-10-19 ENCOUNTER — Ambulatory Visit
Admission: RE | Admit: 2022-10-19 | Discharge: 2022-10-19 | Disposition: A | Discharge status: Home / Self Care | Payer: Medicare Other | Source: Ambulatory Visit | Attending: Student in an Organized Health Care Education/Training Program | Admitting: Student in an Organized Health Care Education/Training Program

## 2022-10-19 DIAGNOSIS — F1010 Alcohol abuse, uncomplicated: Secondary | ICD-10-CM

## 2022-10-19 DIAGNOSIS — R0609 Other forms of dyspnea: Secondary | ICD-10-CM | POA: Diagnosis present

## 2022-10-19 DIAGNOSIS — J449 Chronic obstructive pulmonary disease, unspecified: Secondary | ICD-10-CM

## 2022-10-25 ENCOUNTER — Emergency Department: Payer: Medicare Other

## 2022-10-25 ENCOUNTER — Encounter: Payer: Self-pay | Admitting: Emergency Medicine

## 2022-10-25 ENCOUNTER — Telehealth: Payer: Self-pay

## 2022-10-25 ENCOUNTER — Other Ambulatory Visit: Payer: Self-pay

## 2022-10-25 ENCOUNTER — Inpatient Hospital Stay: Admit: 2022-10-25 | Payer: Medicare Other

## 2022-10-25 ENCOUNTER — Inpatient Hospital Stay
Admission: EM | Admit: 2022-10-25 | Discharge: 2022-10-30 | DRG: 377 | Disposition: A | Discharge status: Home / Self Care | Payer: Medicare Other | Attending: Internal Medicine | Admitting: Internal Medicine

## 2022-10-25 DIAGNOSIS — I2510 Atherosclerotic heart disease of native coronary artery without angina pectoris: Secondary | ICD-10-CM | POA: Diagnosis present

## 2022-10-25 DIAGNOSIS — K2941 Chronic atrophic gastritis with bleeding: Principal | ICD-10-CM | POA: Diagnosis present

## 2022-10-25 DIAGNOSIS — K449 Diaphragmatic hernia without obstruction or gangrene: Secondary | ICD-10-CM | POA: Diagnosis present

## 2022-10-25 DIAGNOSIS — I252 Old myocardial infarction: Secondary | ICD-10-CM | POA: Diagnosis not present

## 2022-10-25 DIAGNOSIS — I342 Nonrheumatic mitral (valve) stenosis: Secondary | ICD-10-CM | POA: Diagnosis present

## 2022-10-25 DIAGNOSIS — D62 Acute posthemorrhagic anemia: Secondary | ICD-10-CM | POA: Diagnosis present

## 2022-10-25 DIAGNOSIS — E611 Iron deficiency: Secondary | ICD-10-CM | POA: Diagnosis present

## 2022-10-25 DIAGNOSIS — I5031 Acute diastolic (congestive) heart failure: Secondary | ICD-10-CM | POA: Insufficient documentation

## 2022-10-25 DIAGNOSIS — E662 Morbid (severe) obesity with alveolar hypoventilation: Secondary | ICD-10-CM

## 2022-10-25 DIAGNOSIS — Z1152 Encounter for screening for COVID-19: Secondary | ICD-10-CM | POA: Diagnosis not present

## 2022-10-25 DIAGNOSIS — A419 Sepsis, unspecified organism: Secondary | ICD-10-CM | POA: Diagnosis not present

## 2022-10-25 DIAGNOSIS — R4189 Other symptoms and signs involving cognitive functions and awareness: Secondary | ICD-10-CM | POA: Diagnosis present

## 2022-10-25 DIAGNOSIS — K219 Gastro-esophageal reflux disease without esophagitis: Secondary | ICD-10-CM | POA: Diagnosis present

## 2022-10-25 DIAGNOSIS — Z7951 Long term (current) use of inhaled steroids: Secondary | ICD-10-CM

## 2022-10-25 DIAGNOSIS — E669 Obesity, unspecified: Secondary | ICD-10-CM | POA: Diagnosis present

## 2022-10-25 DIAGNOSIS — J440 Chronic obstructive pulmonary disease with acute lower respiratory infection: Secondary | ICD-10-CM | POA: Diagnosis present

## 2022-10-25 DIAGNOSIS — I110 Hypertensive heart disease with heart failure: Secondary | ICD-10-CM | POA: Diagnosis present

## 2022-10-25 DIAGNOSIS — Z9049 Acquired absence of other specified parts of digestive tract: Secondary | ICD-10-CM

## 2022-10-25 DIAGNOSIS — Z23 Encounter for immunization: Secondary | ICD-10-CM | POA: Diagnosis present

## 2022-10-25 DIAGNOSIS — R0989 Other specified symptoms and signs involving the circulatory and respiratory systems: Secondary | ICD-10-CM

## 2022-10-25 DIAGNOSIS — E785 Hyperlipidemia, unspecified: Secondary | ICD-10-CM | POA: Diagnosis present

## 2022-10-25 DIAGNOSIS — D500 Iron deficiency anemia secondary to blood loss (chronic): Secondary | ICD-10-CM

## 2022-10-25 DIAGNOSIS — J9601 Acute respiratory failure with hypoxia: Secondary | ICD-10-CM | POA: Diagnosis present

## 2022-10-25 DIAGNOSIS — J441 Chronic obstructive pulmonary disease with (acute) exacerbation: Secondary | ICD-10-CM | POA: Diagnosis present

## 2022-10-25 DIAGNOSIS — I10 Essential (primary) hypertension: Secondary | ICD-10-CM | POA: Diagnosis not present

## 2022-10-25 DIAGNOSIS — Z9981 Dependence on supplemental oxygen: Secondary | ICD-10-CM | POA: Diagnosis not present

## 2022-10-25 DIAGNOSIS — E871 Hypo-osmolality and hyponatremia: Secondary | ICD-10-CM | POA: Diagnosis present

## 2022-10-25 DIAGNOSIS — Z6832 Body mass index (BMI) 32.0-32.9, adult: Secondary | ICD-10-CM

## 2022-10-25 DIAGNOSIS — Z8673 Personal history of transient ischemic attack (TIA), and cerebral infarction without residual deficits: Secondary | ICD-10-CM

## 2022-10-25 DIAGNOSIS — F17220 Nicotine dependence, chewing tobacco, uncomplicated: Secondary | ICD-10-CM | POA: Diagnosis present

## 2022-10-25 DIAGNOSIS — J189 Pneumonia, unspecified organism: Secondary | ICD-10-CM | POA: Diagnosis present

## 2022-10-25 DIAGNOSIS — K921 Melena: Secondary | ICD-10-CM

## 2022-10-25 DIAGNOSIS — Z79899 Other long term (current) drug therapy: Secondary | ICD-10-CM

## 2022-10-25 DIAGNOSIS — D649 Anemia, unspecified: Principal | ICD-10-CM | POA: Diagnosis present

## 2022-10-25 DIAGNOSIS — Z888 Allergy status to other drugs, medicaments and biological substances status: Secondary | ICD-10-CM

## 2022-10-25 DIAGNOSIS — Z7982 Long term (current) use of aspirin: Secondary | ICD-10-CM

## 2022-10-25 DIAGNOSIS — F17210 Nicotine dependence, cigarettes, uncomplicated: Secondary | ICD-10-CM | POA: Diagnosis present

## 2022-10-25 DIAGNOSIS — Z951 Presence of aortocoronary bypass graft: Secondary | ICD-10-CM

## 2022-10-25 LAB — CBC
HCT: 23.2 % — ABNORMAL LOW (ref 39.0–52.0)
HCT: 23.1 % — ABNORMAL LOW (ref 39.0–52.0)
HCT: 26.3 % — ABNORMAL LOW (ref 39.0–52.0)
Hemoglobin: 6.3 g/dL — ABNORMAL LOW (ref 13.0–17.0)
Hemoglobin: 6.6 g/dL — ABNORMAL LOW (ref 13.0–17.0)
Hemoglobin: 7.7 g/dL — ABNORMAL LOW (ref 13.0–17.0)
MCH: 19.4 pg — ABNORMAL LOW (ref 26.0–34.0)
MCH: 20.4 pg — ABNORMAL LOW (ref 26.0–34.0)
MCH: 21.2 pg — ABNORMAL LOW (ref 26.0–34.0)
MCHC: 27.2 g/dL — ABNORMAL LOW (ref 30.0–36.0)
MCHC: 28.6 g/dL — ABNORMAL LOW (ref 30.0–36.0)
MCHC: 29.3 g/dL — ABNORMAL LOW (ref 30.0–36.0)
MCV: 71.4 fL — ABNORMAL LOW (ref 80.0–100.0)
MCV: 71.3 fL — ABNORMAL LOW (ref 80.0–100.0)
MCV: 72.5 fL — ABNORMAL LOW (ref 80.0–100.0)
Platelets: 473 10*3/uL — ABNORMAL HIGH (ref 150–400)
Platelets: 423 10*3/uL — ABNORMAL HIGH (ref 150–400)
Platelets: 399 10*3/uL (ref 150–400)
RBC: 3.25 MIL/uL — ABNORMAL LOW (ref 4.22–5.81)
RBC: 3.24 MIL/uL — ABNORMAL LOW (ref 4.22–5.81)
RBC: 3.63 MIL/uL — ABNORMAL LOW (ref 4.22–5.81)
RDW: 17.6 % — ABNORMAL HIGH (ref 11.5–15.5)
RDW: 17.3 % — ABNORMAL HIGH (ref 11.5–15.5)
RDW: 18.2 % — ABNORMAL HIGH (ref 11.5–15.5)
WBC: 13.9 10*3/uL — ABNORMAL HIGH (ref 4.0–10.5)
WBC: 12.8 10*3/uL — ABNORMAL HIGH (ref 4.0–10.5)
WBC: 14.1 10*3/uL — ABNORMAL HIGH (ref 4.0–10.5)
nRBC: 0 % (ref 0.0–0.2)
nRBC: 0 % (ref 0.0–0.2)
nRBC: 0 % (ref 0.0–0.2)

## 2022-10-25 LAB — TROPONIN I (HIGH SENSITIVITY)
Troponin I (High Sensitivity): 37 ng/L — ABNORMAL HIGH (ref <18)
Troponin I (High Sensitivity): 38 ng/L — ABNORMAL HIGH (ref <18)

## 2022-10-25 LAB — BASIC METABOLIC PANEL
Anion gap: 12 (ref 5–15)
Anion gap: 15 (ref 5–15)
BUN: 15 mg/dL (ref 8–23)
BUN: 13 mg/dL (ref 8–23)
CO2: 23 mmol/L (ref 22–32)
CO2: 18 mmol/L — ABNORMAL LOW (ref 22–32)
Calcium: 8.8 mg/dL — ABNORMAL LOW (ref 8.9–10.3)
Calcium: 8.6 mg/dL — ABNORMAL LOW (ref 8.9–10.3)
Chloride: 98 mmol/L (ref 98–111)
Chloride: 98 mmol/L (ref 98–111)
Creatinine, Ser: 1.06 mg/dL (ref 0.61–1.24)
Creatinine, Ser: 0.94 mg/dL (ref 0.61–1.24)
GFR, Estimated: >60 mL/min (ref >60)
GFR, Estimated: >60 mL/min (ref >60)
Glucose, Bld: 128 mg/dL — ABNORMAL HIGH (ref 70–99)
Glucose, Bld: 104 mg/dL — ABNORMAL HIGH (ref 70–99)
Potassium: 3.4 mmol/L — ABNORMAL LOW (ref 3.5–5.1)
Potassium: 3.6 mmol/L (ref 3.5–5.1)
Sodium: 133 mmol/L — ABNORMAL LOW (ref 135–145)
Sodium: 131 mmol/L — ABNORMAL LOW (ref 135–145)

## 2022-10-25 LAB — PROTIME-INR
INR: 1.3 — ABNORMAL HIGH (ref 0.8–1.2)
Prothrombin Time: 15.6 seconds — ABNORMAL HIGH (ref 11.4–15.2)

## 2022-10-25 LAB — IRON AND TIBC
Iron: 18 ug/dL — ABNORMAL LOW (ref 45–182)
Saturation Ratios: 4 % — ABNORMAL LOW (ref 17.9–39.5)
TIBC: 493 ug/dL — ABNORMAL HIGH (ref 250–450)
UIBC: 475 ug/dL

## 2022-10-25 LAB — RETICULOCYTES
Immature Retic Fract: 30 % — ABNORMAL HIGH (ref 2.3–15.9)
RBC.: 3.23 MIL/uL — ABNORMAL LOW (ref 4.22–5.81)
Retic Count, Absolute: 76.9 10*3/uL (ref 19.0–186.0)
Retic Ct Pct: 2.4 % (ref 0.4–3.1)

## 2022-10-25 LAB — RESP PANEL BY RT-PCR (FLU A&B, COVID) ARPGX2
Influenza A by PCR: NEGATIVE
Influenza B by PCR: NEGATIVE
SARS Coronavirus 2 by RT PCR: NEGATIVE

## 2022-10-25 LAB — PREPARE RBC (CROSSMATCH)

## 2022-10-25 LAB — BRAIN NATRIURETIC PEPTIDE: B Natriuretic Peptide: 702.2 pg/mL — ABNORMAL HIGH (ref 0.0–100.0)

## 2022-10-25 LAB — CORTISOL-AM, BLOOD: Cortisol - AM: 6.8 ug/dL (ref 6.7–22.6)

## 2022-10-25 LAB — PROCALCITONIN: Procalcitonin: <0.1 ng/mL

## 2022-10-25 LAB — FERRITIN: Ferritin: 9 ng/mL — ABNORMAL LOW (ref 24–336)

## 2022-10-25 LAB — FOLATE: Folate: 6.4 ng/mL (ref >5.9)

## 2022-10-25 LAB — VITAMIN B12: Vitamin B-12: 1277 pg/mL — ABNORMAL HIGH (ref 180–914)

## 2022-10-25 MED ORDER — IPRATROPIUM-ALBUTEROL 0.5-2.5 (3) MG/3ML IN SOLN
3.0000 mL | Freq: Two times a day (BID) | RESPIRATORY_TRACT | Status: DC
  Administered 2022-10-26 – 2022-10-30 (×9): 3 mL via RESPIRATORY_TRACT
  Filled 2022-10-25 (×9): qty 3

## 2022-10-25 MED ORDER — ENOXAPARIN SODIUM 60 MG/0.6ML IJ SOSY
0.5000 mg/kg | PREFILLED_SYRINGE | INTRAMUSCULAR | Status: DC
  Administered 2022-10-25: 57.5 mg via SUBCUTANEOUS
  Filled 2022-10-25: qty 0.6

## 2022-10-25 MED ORDER — UMECLIDINIUM BROMIDE 62.5 MCG/ACT IN AEPB
1.0000 | INHALATION_SPRAY | Freq: Every day | RESPIRATORY_TRACT | Status: DC
  Administered 2022-10-25 – 2022-10-30 (×6): 1 via RESPIRATORY_TRACT
  Filled 2022-10-25: qty 7

## 2022-10-25 MED ORDER — ACETAMINOPHEN 325 MG PO TABS
650.0000 mg | ORAL_TABLET | Freq: Four times a day (QID) | ORAL | Status: DC | PRN
  Administered 2022-10-25 – 2022-10-27 (×3): 650 mg via ORAL
  Filled 2022-10-25 (×3): qty 2

## 2022-10-25 MED ORDER — FLUTICASONE FUROATE-VILANTEROL 100-25 MCG/ACT IN AEPB
1.0000 | INHALATION_SPRAY | Freq: Every day | RESPIRATORY_TRACT | Status: DC
  Administered 2022-10-25 – 2022-10-30 (×6): 1 via RESPIRATORY_TRACT
  Filled 2022-10-25: qty 28

## 2022-10-25 MED ORDER — FUROSEMIDE 10 MG/ML IJ SOLN
20.0000 mg | Freq: Two times a day (BID) | INTRAMUSCULAR | Status: DC
  Administered 2022-10-25 – 2022-10-26 (×3): 20 mg via INTRAVENOUS
  Filled 2022-10-25 (×3): qty 4

## 2022-10-25 MED ORDER — SODIUM CHLORIDE 0.9 % IV SOLN
1000.0000 mg | Freq: Once | INTRAVENOUS | Status: AC
  Administered 2022-10-25: 1000 mg via INTRAVENOUS
  Filled 2022-10-25: qty 20

## 2022-10-25 MED ORDER — SODIUM CHLORIDE 0.9 % IV SOLN
2.0000 g | INTRAVENOUS | Status: AC
  Administered 2022-10-25 – 2022-10-29 (×5): 2 g via INTRAVENOUS
  Filled 2022-10-25 (×3): qty 20
  Filled 2022-10-25: qty 2
  Filled 2022-10-25: qty 20

## 2022-10-25 MED ORDER — GUAIFENESIN ER 600 MG PO TB12
600.0000 mg | ORAL_TABLET | Freq: Two times a day (BID) | ORAL | Status: DC
  Administered 2022-10-25 – 2022-10-30 (×11): 600 mg via ORAL
  Filled 2022-10-25 (×11): qty 1

## 2022-10-25 MED ORDER — IPRATROPIUM-ALBUTEROL 0.5-2.5 (3) MG/3ML IN SOLN
3.0000 mL | Freq: Four times a day (QID) | RESPIRATORY_TRACT | Status: DC
  Administered 2022-10-25: 3 mL via RESPIRATORY_TRACT
  Filled 2022-10-25: qty 3

## 2022-10-25 MED ORDER — ACETAMINOPHEN 650 MG RE SUPP: 650.0000 mg | Freq: Four times a day (QID) | RECTAL | Status: DC | PRN

## 2022-10-25 MED ORDER — SODIUM CHLORIDE 0.9 % IV SOLN: 10.0000 mL/h | Freq: Once | INTRAVENOUS | Status: DC

## 2022-10-25 MED ORDER — SODIUM CHLORIDE 0.9 % IV SOLN
25.0000 mg | Freq: Once | INTRAVENOUS | Status: AC
  Administered 2022-10-25: 25 mg via INTRAVENOUS
  Filled 2022-10-25: qty 0.5

## 2022-10-25 MED ORDER — ONDANSETRON HCL 4 MG/2ML IJ SOLN: 4.0000 mg | Freq: Four times a day (QID) | INTRAMUSCULAR | Status: DC | PRN

## 2022-10-25 MED ORDER — HYDROCOD POLI-CHLORPHE POLI ER 10-8 MG/5ML PO SUER: 5.0000 mL | Freq: Two times a day (BID) | ORAL | Status: DC | PRN

## 2022-10-25 MED ORDER — MAGNESIUM HYDROXIDE 400 MG/5ML PO SUSP: 30.0000 mL | Freq: Every day | ORAL | Status: DC | PRN

## 2022-10-25 MED ORDER — TRAZODONE HCL 50 MG PO TABS
25.0000 mg | ORAL_TABLET | Freq: Every evening | ORAL | Status: DC | PRN
  Administered 2022-10-26 – 2022-10-28 (×2): 25 mg via ORAL
  Filled 2022-10-25 (×2): qty 1

## 2022-10-25 MED ORDER — INFLUENZA VAC SPLIT QUAD 0.5 ML IM SUSY
0.5000 mL | PREFILLED_SYRINGE | INTRAMUSCULAR | Status: AC
  Administered 2022-10-30: 0.5 mL via INTRAMUSCULAR
  Filled 2022-10-25: qty 0.5

## 2022-10-25 MED ORDER — SODIUM CHLORIDE 0.9% IV SOLUTION
Freq: Once | INTRAVENOUS | Status: DC
  Filled 2022-10-25: qty 250

## 2022-10-25 MED ORDER — ONDANSETRON HCL 4 MG PO TABS: 4.0000 mg | ORAL_TABLET | Freq: Four times a day (QID) | ORAL | Status: DC | PRN

## 2022-10-25 MED ORDER — IPRATROPIUM-ALBUTEROL 0.5-2.5 (3) MG/3ML IN SOLN
3.0000 mL | Freq: Four times a day (QID) | RESPIRATORY_TRACT | Status: DC
  Administered 2022-10-25 (×3): 3 mL via RESPIRATORY_TRACT
  Filled 2022-10-25 (×3): qty 3

## 2022-10-25 MED ORDER — SODIUM CHLORIDE 0.9 % IV SOLN
500.0000 mg | INTRAVENOUS | Status: AC
  Administered 2022-10-25 – 2022-10-29 (×5): 500 mg via INTRAVENOUS
  Filled 2022-10-25: qty 5
  Filled 2022-10-25: qty 500
  Filled 2022-10-25 (×3): qty 5

## 2022-10-25 NOTE — ED Triage Notes (Signed)
Pt to ED via EMS c/o bilateral feet swelling, SOB, and left leg numbness for 7 months.  States increased SOB when walking for "a while".  Pt reports drinking 2 40oz beers tonight.  Denies cough, fever, n/v/d.  Pt A&Ox4, chest rise even and unlabored, skin dry, in NAD at this time.

## 2022-10-25 NOTE — H&P (Signed)
[place]   PATIENT NAME: [name]    MR#:  [id number]  DATE OF BIRTH:  [birth year]  DATE OF ADMISSION:  10/25/2022  PRIMARY CARE PHYSICIAN: Center, [facility]   Patient is coming from: Home  REQUESTING/REFERRING PHYSICIAN: Ward, Delice Bison, DO  CHIEF COMPLAINT:   Chief Complaint  Patient presents with   Foot Swelling    HISTORY OF PRESENT ILLNESS:  [name] is a 61 y.o. Caucasian male with medical history significant for COPD, CVA, hypertension and coronary artery disease, who presented to the ER with acute onset of left leg and foot numbness which has been worsening but has been going on for months as well as bilateral lower extremity edema, recent dyspnea with associated nonproductive cough and chest pain.  He has been occasionally expectorating with his cough.  No fever or chills.  He had 3 vaccine injections for COVID-19.  No nausea or vomiting or diarrhea or abdominal pain.  He has been having urinary urgency without dysuria or hematuria or flank pain.  He admits to dyspnea on exertion and mild orthopnea.  ED Course: When the came to the ER, vital signs were within normal and later respiratory rate was 23 and later BP was 154/108.  Labs revealed hyponatremia of 131 and CO2 of 18.  IR panel revealed low serum iron and high TIBC with low ferritin and low saturation.  CBC showed leukocytosis at 13.9 with hemoglobin of 6.3 and hematocrit 23.2 compared to 9.6 and 31 and platelets were 473.  INR was 1.3 and PT 15.6.  Stool Hemoccult came back negative. EKG as reviewed by me : EKG showed normal sinus rhythm with a rate of 78 with anteroseptal Q waves. Imaging: .  Chest x-ray showed cardiomegaly and pulmonary vascular congestion as well as small right pleural effusion with associated atelectasis or consolidation with pneumonia being difficult to exclude.  It showed COPD changes.  The patient was so far typed and crossmatched and will be transfused 1 unit of  packed red blood cells.  He will be given 20 mg of IV Lasix as well as IV Rocephin and Zithromax.  He will be admitted to a medical telemetry bed for further evaluation and management. PAST MEDICAL HISTORY:   Past Medical History:  Diagnosis Date   COPD (chronic obstructive pulmonary disease) ([place])    CVA (cerebral infarction)    Headache    mild, since stroke 2012   Hypertension    MI (myocardial infarction) ([place]) 05/09/2012   Reading difficulty    pt reports he reads at "about a second grade level"   Stroke [facility]) 2012   numbness to left hand    Wears dentures    full upper and lower    PAST SURGICAL HISTORY:   Past Surgical History:  Procedure Laterality Date   CAROTID ENDARTERECTOMY Left    2012 or 2013   CHOLECYSTECTOMY N/A 07/26/2016   Procedure: LAPAROSCOPIC CHOLECYSTECTOMY;  Surgeon: Florene Glen, MD;  Location: [facility] ORS;  Service: General;  Laterality: N/A;   COLONOSCOPY WITH PROPOFOL N/A 02/09/2022   Procedure: COLONOSCOPY WITH PROPOFOL;  Surgeon: Lucilla Lame, MD;  Location: [facility] ENDOSCOPY;  Service: Endoscopy;  Laterality: N/A;   CORONARY ARTERY BYPASS GRAFT  05/10/2012   3 vessel    SOCIAL HISTORY:   Social History   Tobacco Use   Smoking status: Former    Packs/day: 4.00    Years: 43.00    Total pack years: 172.00  Types: Cigarettes    Quit date: 29    Years since quitting: 30.8   Smokeless tobacco: Current    Types: Snuff   Tobacco comments:    changed to dip  Substance Use Topics   Alcohol use: Yes    Alcohol/week: 12.0 standard drinks of alcohol    Types: 12 Cans of beer per week    Comment: 2 40oz beers 10/25/22    FAMILY HISTORY:   Family History  Problem Relation Age of Onset   Pneumonia Mother     DRUG ALLERGIES:   Allergies  Allergen Reactions   Other     Welbutrin - has paranoia     REVIEW OF SYSTEMS:   ROS As per history of present illness. All pertinent systems were reviewed above. Constitutional, HEENT,  cardiovascular, respiratory, GI, GU, musculoskeletal, neuro, psychiatric, endocrine, integumentary and hematologic systems were reviewed and are otherwise negative/unremarkable except for positive findings mentioned above in the HPI.   MEDICATIONS AT HOME:   Prior to Admission medications   Medication Sig Start Date End Date Taking? Authorizing Provider  amLODipine (NORVASC) 5 MG tablet Take 1 tablet (5 mg total) by mouth daily. 06/05/21 10/25/22 Yes Lavina Hamman, MD  aspirin EC 81 MG EC tablet Take 1 tablet (81 mg total) by mouth daily. 07/27/16  Yes Vaughan Basta, MD  cetirizine (ZYRTEC) 10 MG tablet Take 10 mg by mouth daily. 08/04/22  Yes [provider]  donepezil (ARICEPT) 10 MG tablet Take 10 mg by mouth daily.    Yes [provider]  fluticasone (FLONASE) 50 MCG/ACT nasal spray Place 2 sprays into both nostrils daily. 08/04/22  Yes [provider]  Melatonin 10 MG CAPS Take 1 capsule by mouth at bedtime. 08/04/22  Yes [provider]  metoprolol succinate (TOPROL-XL) 25 MG 24 hr tablet Take 25 mg by mouth daily. 05/24/21  Yes [provider]  omeprazole (PRILOSEC) 20 MG capsule Take 1 capsule (20 mg total) by mouth 2 (two) times daily before a meal. 06/05/21  Yes Lavina Hamman, MD  pravastatin (PRAVACHOL) 20 MG tablet Take 20 mg by mouth at bedtime.    Yes [provider]  TRELEGY ELLIPTA 100-62.5-25 MCG/INH AEPB Inhale 1 puff into the lungs daily. 05/24/21  Yes [provider]  vitamin B-12 (CYANOCOBALAMIN) 1000 MCG tablet Take 1 tablet (1,000 mcg total) by mouth daily. 06/05/21  Yes Lavina Hamman, MD  albuterol (VENTOLIN HFA) 108 (90 Base) MCG/ACT inhaler Inhale 2 puffs into the lungs every 4 (four) hours as needed for wheezing or shortness of breath. 08/14/22   Armando Reichert, MD      VITAL SIGNS:  Blood pressure (!) 154/108, pulse 89, temperature 97.7 F (36.5 C), temperature source Oral, resp. rate (!) 22, height  6' (1.829 m), weight 113.4 kg, SpO2 99 %.  PHYSICAL EXAMINATION:  Physical Exam  GENERAL:  61 y.o.-year-old Caucasian male patient lying in the bed with no acute distress.  EYES: Pupils equal, round, reactive to light and accommodation. No scleral icterus. Extraocular muscles intact.  HEENT: Head atraumatic, normocephalic. Oropharynx and nasopharynx clear.  NECK:  Supple, no jugular venous distention. No thyroid enlargement, no tenderness.  LUNGS: Diminished bibasilar breath sounds with bibasal crackles. No use of accessory muscles of respiration.  CARDIOVASCULAR: Regular rate and rhythm, S1, S2 normal. No murmurs, rubs, or gallops.  ABDOMEN: Soft, nondistended, nontender. Bowel sounds present. No organomegaly or mass.  EXTREMITIES: 1-2+ bilateral lower extremity pitting edema with no cyanosis,  or clubbing.  NEUROLOGIC: Cranial nerves II through XII are intact. Muscle strength 5/5 in all extremities. Sensation intact. Gait not checked.  PSYCHIATRIC: The patient is alert and oriented x 3.  Normal affect and good eye contact. SKIN: No obvious rash, lesion, or ulcer.   LABORATORY PANEL:   CBC Recent Labs  Lab 10/25/22 0228  WBC 13.9*  HGB 6.3*  HCT 23.2*  PLT 473*   ------------------------------------------------------------------------------------------------------------------  Chemistries  Recent Labs  Lab 10/25/22 0424  NA 131*  K 3.6  CL 98  CO2 18*  GLUCOSE 104*  BUN 13  CREATININE 0.94  CALCIUM 8.6*   ------------------------------------------------------------------------------------------------------------------  Cardiac Enzymes No results for input(s): "TROPONINI" in the last 168 hours. ------------------------------------------------------------------------------------------------------------------  RADIOLOGY:  DG Chest 1 View  Result Date: 10/25/2022 CLINICAL DATA:  Bilateral feet swelling; shortness of breath EXAM: CHEST  1 VIEW COMPARISON:  CT chest  10/19/2022 and radiographs 06/03/2021 FINDINGS: Sternotomy and CABG. Stable cardiomegaly. Pulmonary vascular congestion. Small right pleural effusion and associated atelectasis/consolidation. Hyperinflation. No pneumothorax. No acute osseous abnormality. IMPRESSION: Small right pleural effusion and associated atelectasis or consolidation. Pneumonia is difficult to exclude. COPD. Cardiomegaly and pulmonary vascular congestion. Electronically Signed   By: Placido Sou M.D.   On: 10/25/2022 03:06      IMPRESSION AND PLAN:  Assessment and Plan: * Symptomatic anemia - The patient will be made to a medical telemetry bed. - She was typed and crossmatched will be transfused 1 unit packed red blood cells. - We will follow posttransfusion H&H. - We will obtain further anemia work-up.  Sepsis due to pneumonia ([place]) - Sepsis manifested by leukocytosis and tachypnea. - He will be placed on IV Rocephin and Zithromax. - Bronchodilator therapy will be provided as well as mucolytic therapy. - We will follow blood cultures.   Pulmonary vascular congestion - This is concerning for acute new onset CHF, likely diastolic specially given elevated BN P. - He will be diuresed with IV Lasix. - We will follow serial troponins. - We will obtain a 2D echo.  Dyslipidemia - We will continue statin therapy.  GERD without esophagitis - We will continue PPI therapy.  Essential hypertension - We will continue his antihypertensives  Cognitive impairment - We will continue his Aricept.   DVT prophylaxis: Lovenox.  Advanced Care Planning:  Code Status: full code.  Family Communication:  The plan of care was discussed in details with the patient (and family). I answered all questions. The patient agreed to proceed with the above mentioned plan. Further management will depend upon hospital course. Disposition Plan: Back to previous home environment Consults called: none.  All the records are reviewed and case  discussed with ED provider.  Status is: Inpatient    At the time of the admission, it appears that the appropriate admission status for this patient is inpatient.  This is judged to be reasonable and necessary in order to provide the required intensity of service to ensure the patient's safety given the presenting symptoms, physical exam findings and initial radiographic and laboratory data in the context of comorbid conditions.  The patient requires inpatient status due to high intensity of service, high risk of further deterioration and high frequency of surveillance required.  I certify that at the time of admission, it is my clinical judgment that the patient will require inpatient hospital care extending more than 2 midnights.  Dispo: The patient is from: Home              Anticipated d/c is to: Home              Patient currently is not medically stable to d/c.              Difficult to place patient: No  Christel Mormon M.D on 10/25/2022 at 6:06 AM  [facility]   From 7 PM-7 AM, contact night-coverage [email or web address]  CC: Primary care physician; Center, [facility]

## 2022-10-25 NOTE — ED Notes (Signed)
Transport here.

## 2022-10-25 NOTE — Assessment & Plan Note (Addendum)
-   some concern for infiltrate in RLL vs congestion - continue diuresis and abx for now; completed 5 days antibiotics

## 2022-10-25 NOTE — ED Provider Notes (Signed)
[facility] Provider Note    Event Date/Time   First MD Initiated Contact with Patient 10/25/22 0400     (approximate)   History   Foot Swelling   HPI  [name] is a 61 y.o. male history of hypertension, CVA, COPD who presents to the emergency department with multiple complaints.  States he has had several days of cough but no fever.  Has had intermittent chest pain and shortness of breath.  Also reports bilateral lower extremity swelling and chronic numbness of the left leg for the past several months.  Also states he has had dark stools but no hematochezia.   History provided by patient.    Past Medical History:  Diagnosis Date   COPD (chronic obstructive pulmonary disease) ([facility])    CVA (cerebral infarction)    Headache    mild, since stroke 2012   Hypertension    MI (myocardial infarction) ([place]) 05/09/2012   Reading difficulty    pt reports he reads at "about a second grade level"   Stroke [facility]) 2012   numbness to left hand    Wears dentures    full upper and lower    Past Surgical History:  Procedure Laterality Date   CAROTID ENDARTERECTOMY Left    2012 or 2013   CHOLECYSTECTOMY N/A 07/26/2016   Procedure: LAPAROSCOPIC CHOLECYSTECTOMY;  Surgeon: Florene Glen, MD;  Location: [facility] ORS;  Service: General;  Laterality: N/A;   COLONOSCOPY WITH PROPOFOL N/A 02/09/2022   Procedure: COLONOSCOPY WITH PROPOFOL;  Surgeon: Lucilla Lame, MD;  Location: [facility] ENDOSCOPY;  Service: Endoscopy;  Laterality: N/A;   CORONARY ARTERY BYPASS GRAFT  05/10/2012   3 vessel    MEDICATIONS:  Prior to Admission medications   Medication Sig Start Date End Date Taking? Authorizing Provider  albuterol (VENTOLIN HFA) 108 (90 Base) MCG/ACT inhaler Inhale 2 puffs into the lungs every 4 (four) hours as needed for wheezing or shortness of breath. 08/14/22   Armando Reichert, MD  amLODipine (NORVASC) 5 MG tablet Take 1 tablet (5 mg total) by mouth daily. 06/05/21 06/05/22   Lavina Hamman, MD  aspirin EC 81 MG EC tablet Take 1 tablet (81 mg total) by mouth daily. 07/27/16   Vaughan Basta, MD  cetirizine (ZYRTEC) 10 MG tablet Take 10 mg by mouth daily. 08/04/22   [provider]  donepezil (ARICEPT) 10 MG tablet Take 10 mg by mouth daily.     [provider]  fluticasone (FLONASE) 50 MCG/ACT nasal spray Place 2 sprays into both nostrils daily. 08/04/22   [provider]  Melatonin 10 MG CAPS Take 1 capsule by mouth at bedtime. 08/04/22   [provider]  metoprolol succinate (TOPROL-XL) 25 MG 24 hr tablet Take 25 mg by mouth daily. 05/24/21   [provider]  omeprazole (PRILOSEC) 20 MG capsule Take 1 capsule (20 mg total) by mouth 2 (two) times daily before a meal. 06/05/21   Lavina Hamman, MD  pravastatin (PRAVACHOL) 20 MG tablet Take 20 mg by mouth at bedtime.     [provider]  TRELEGY ELLIPTA 100-62.5-25 MCG/INH AEPB Inhale 1 puff into the lungs daily. 05/24/21   [provider]  vitamin B-12 (CYANOCOBALAMIN) 1000 MCG tablet Take 1 tablet (1,000 mcg total) by mouth daily. 06/05/21   Lavina Hamman, MD    Physical Exam   Triage Vital Signs: ED Triage Vitals  Enc Vitals Group     BP 10/25/22 0223 123/79  Pulse Rate 10/25/22 0223 78     Resp 10/25/22 0223 18     Temp 10/25/22 0223 97.7 F (36.5 C)     Temp Source 10/25/22 0223 Oral     SpO2 10/25/22 0211 96 %     Weight 10/25/22 0223 250 lb (113.4 kg)     Height 10/25/22 0223 6' (1.829 m)     Head Circumference --      Peak Flow --      Pain Score 10/25/22 0223 0     Pain Loc --      Pain Edu? --      Excl. in [place]? --     Most recent vital signs: Vitals:   10/25/22 0211 10/25/22 0223  BP:  123/79  Pulse:  78  Resp:  18  Temp:  97.7 F (36.5 C)  SpO2: 96% 99%    CONSTITUTIONAL: Alert and oriented and responds appropriately to questions.  Chronically ill-appearing HEAD: Normocephalic, atraumatic EYES: Conjunctivae  clear, pupils appear equal, sclera nonicteric ENT: normal nose; moist mucous membranes NECK: Supple, normal ROM CARD: RRR; S1 and S2 appreciated; no murmurs, no clicks, no rubs, no gallops RESP: Normal chest excursion without splinting or tachypnea; breath sounds clear and equal bilaterally; no wheezes, no rhonchi, no rales, no hypoxia or respiratory distress, speaking full sentences ABD/GI: Normal bowel sounds; non-distended; soft, non-tender, no rebound, no guarding, no peritoneal signs RECTAL:  Normal rectal tone, no gross blood or melena, guaiac NEGATIVE, no hemorrhoids appreciated, nontender rectal exam, no fecal impaction. Chaperone present. BACK: The back appears normal EXT: Normal ROM in all joints; no deformity noted, patient has peripheral edema in bilateral lower extremities, 2+ DP pulses dopplered in both feet SKIN: Normal color for age and race; warm; no rash on exposed skin NEURO: Moves all extremities equally, normal speech, reports diminished sensation in left leg, right but states that both of them are numb, no facial asymmetry PSYCH: The patient's mood and manner are appropriate.   ED Results / Procedures / Treatments   LABS: (all labs ordered are listed, but only abnormal results are displayed) Labs Reviewed  BASIC METABOLIC PANEL - Abnormal; Notable for the following components:      Result Value   Sodium 133 (*)    Potassium 3.4 (*)    Glucose, Bld 128 (*)    Calcium 8.8 (*)    All other components within normal limits  CBC - Abnormal; Notable for the following components:   WBC 13.9 (*)    RBC 3.25 (*)    Hemoglobin 6.3 (*)    HCT 23.2 (*)    MCV 71.4 (*)    MCH 19.4 (*)    MCHC 27.2 (*)    RDW 17.6 (*)    Platelets 473 (*)    All other components within normal limits  RETICULOCYTES - Abnormal; Notable for the following components:   RBC. 3.23 (*)    Immature Retic Fract 30.0 (*)    All other components within normal limits  TROPONIN I (HIGH SENSITIVITY)  - Abnormal; Notable for the following components:   Troponin I (High Sensitivity) 37 (*)    All other components within normal limits  CULTURE, BLOOD (ROUTINE X 2)  CULTURE, BLOOD (ROUTINE X 2)  RESP PANEL BY RT-PCR (FLU A&B, COVID) ARPGX2  BRAIN NATRIURETIC PEPTIDE  VITAMIN B12  FOLATE  IRON AND TIBC  FERRITIN  HIV ANTIBODY (ROUTINE TESTING W REFLEX)  CORTISOL-AM, BLOOD  PROCALCITONIN  BASIC METABOLIC PANEL  CBC  PROTIME-INR  PROCALCITONIN  TYPE AND SCREEN  PREPARE RBC (CROSSMATCH)  TROPONIN I (HIGH SENSITIVITY)     EKG:  EKG Interpretation  Date/Time:  Wednesday October 25 2022 02:18:48 EST Ventricular Rate:  78 PR Interval:  188 QRS Duration: 92 QT Interval:  426 QTC Calculation: 485 R Axis:   76 Text Interpretation: Normal sinus rhythm Anteroseptal infarct , age undetermined Abnormal ECG When compared with ECG of 03-Jun-2021 16:30, Anteroseptal infarct is now Present Nonspecific T wave abnormality now evident in Anterior leads Confirmed by Pryor Curia [phone]) on 10/25/2022 3:57:22 AM         RADIOLOGY: My personal review and interpretation of imaging: Chest x-ray concerning for possible pneumonia versus atelectasis.  I have personally reviewed all radiology reports.   DG Chest 1 View  Result Date: 10/25/2022 CLINICAL DATA:  Bilateral feet swelling; shortness of breath EXAM: CHEST  1 VIEW COMPARISON:  CT chest 10/19/2022 and radiographs 06/03/2021 FINDINGS: Sternotomy and CABG. Stable cardiomegaly. Pulmonary vascular congestion. Small right pleural effusion and associated atelectasis/consolidation. Hyperinflation. No pneumothorax. No acute osseous abnormality. IMPRESSION: Small right pleural effusion and associated atelectasis or consolidation. Pneumonia is difficult to exclude. COPD. Cardiomegaly and pulmonary vascular congestion. Electronically Signed   By: Placido Sou M.D.   On: 10/25/2022 03:06     PROCEDURES:  Critical Care performed: Yes, see  critical care procedure note(s)   CRITICAL CARE Performed by: Cyril Mourning Magnus Crescenzo   Total critical care time: 45 minutes  Critical care time was exclusive of separately billable procedures and treating other patients.  Critical care was necessary to treat or prevent imminent or life-threatening deterioration.  Critical care was time spent personally by me on the following activities: development of treatment plan with patient and/or surrogate as well as nursing, discussions with consultants, evaluation of patient's response to treatment, examination of patient, obtaining history from patient or surrogate, ordering and performing treatments and interventions, ordering and review of laboratory studies, ordering and review of radiographic studies, pulse oximetry and re-evaluation of patient's condition.   Marland Kitchen1-3 Lead EKG Interpretation  Performed by: Benicia Bergevin, Delice Bison, DO Authorized by: Laksh Hinners, Delice Bison, DO     Interpretation: normal     ECG rate:  78   ECG rate assessment: normal     Rhythm: sinus rhythm     Ectopy: none     Conduction: normal       IMPRESSION / MDM / ASSESSMENT AND PLAN / ED COURSE  I reviewed the triage vital signs and the nursing notes.    Patient here with multiple complaints.  Complaining of chest pain, shortness of breath, cough, dark stools, bilateral leg swelling, leg numbness.  The patient is on the cardiac monitor to evaluate for evidence of arrhythmia and/or significant heart rate changes.   DIFFERENTIAL DIAGNOSIS (includes but not limited to):   ACS, PE, pneumonia, CHF, anemia, electrolyte derangement, stroke, DVT   Patient's presentation is most consistent with acute presentation with potential threat to life or bodily function.   PLAN: Patient's work-up initiated from triage.  He does have a leukocytosis of 13,000 but no fever.  Denies infectious symptoms other than recent cough but states that this has improved.  Chest x-ray reviewed and interpreted by  myself and the radiologist and shows atelectasis versus right lower lobe pneumonia.  We will add on procalcitonin and obtain COVID and flu swabs.  We will hold antibiotics at this time given he is not symptomatic and he is not febrile.  Patient  was also found to have a hemoglobin of 6.3 which is down from where he was in August at 9.6.  He is guaiac negative here.  Brown appearing stool but states he does have a "dark" stools sometimes.  He is not sure if he is ever had a blood transfusion.  We will send an anemia panel and give 1 unit of packed red blood cells as this could be contributing to why he is having chest pain and shortness of breath.  He was also found to have an elevated troponin of 37 here.  Second troponin pending.  His EKG shows no new ischemic change.  He is not having active chest pain at this time.  Slightly elevated troponin could be secondary to demand ischemia.  We will also add on BNP given history of CHF with bilateral lower extremity swelling.  Chest x-ray does show vascular congestion but no overt edema.  He has no hypoxia currently.  Recommended admission to the hospital for his symptomatic anemia and monitoring of his troponins infectious work-up for possible pneumonia.  Patient comfortable with this plan.   MEDICATIONS GIVEN IN ED: Medications  0.9 %  sodium chloride infusion (0 mL/hr Intravenous Hold 10/25/22 0452)  enoxaparin (LOVENOX) injection 57.5 mg (has no administration in time range)  cefTRIAXone (ROCEPHIN) 2 g in sodium chloride 0.9 % 100 mL IVPB (has no administration in time range)  azithromycin (ZITHROMAX) 500 mg in sodium chloride 0.9 % 250 mL IVPB (has no administration in time range)  acetaminophen (TYLENOL) tablet 650 mg (has no administration in time range)    Or  acetaminophen (TYLENOL) suppository 650 mg (has no administration in time range)  traZODone (DESYREL) tablet 25 mg (has no administration in time range)  magnesium hydroxide (MILK OF  MAGNESIA) suspension 30 mL (has no administration in time range)  ondansetron (ZOFRAN) tablet 4 mg (has no administration in time range)    Or  ondansetron (ZOFRAN) injection 4 mg (has no administration in time range)  guaiFENesin (MUCINEX) 12 hr tablet 600 mg (has no administration in time range)  ipratropium-albuterol (DUONEB) 0.5-2.5 (3) MG/3ML nebulizer solution 3 mL (has no administration in time range)  chlorpheniramine-HYDROcodone (TUSSIONEX) 10-8 MG/5ML suspension 5 mL (has no administration in time range)     ED COURSE:  Consulted and discussed patient's case with hospitalist, Dr. Sidney Ace.  I have recommended admission and consulting physician agrees and will place admission orders.  Patient (and family if present) agree with this plan.   I reviewed all nursing notes, vitals, pertinent previous records.  All labs, EKGs, imaging ordered have been independently reviewed and interpreted by myself.      OUTSIDE RECORDS REVIEWED: Reviewed patient's recent pulmonology visits with negative bilateral lower extremity venous Dopplers in 10/19/2022.  CT of the chest without contrast on the same day showed emphysema, small to moderate right pleural effusion with atelectasis.  No consolidation seen at that time.       FINAL CLINICAL IMPRESSION(S) / ED DIAGNOSES   Final diagnoses:  Symptomatic anemia     Rx / DC Orders   ED Discharge Orders     None        Note:  This document was prepared using Dragon voice recognition software and may include unintentional dictation errors.   Skarlette Lattner, Delice Bison, DO 10/25/22 [phone]

## 2022-10-25 NOTE — Assessment & Plan Note (Addendum)
-   PND, pulmonary crackles, pulm edema, B/L LE edema, DOE/SOB - recent echo on 10/31: EF 60-65%, Gr II DD. Moderate AS - dilated IVC concerning for RHF due to overload - diuresed well with lasix  - patient recommended for cardiology outpt follow up already by PCP

## 2022-10-25 NOTE — ED Notes (Signed)
Pt provided a sandwich tray and OJ.

## 2022-10-25 NOTE — ED Notes (Signed)
Lab called to draw repeat H&H post transfusion

## 2022-10-25 NOTE — Telephone Encounter (Signed)
Letter sent regarding results. Unable to reach patient.

## 2022-10-25 NOTE — Assessment & Plan Note (Signed)
continue PPI

## 2022-10-25 NOTE — Progress Notes (Signed)
7949 - received report 2212 - scheduled medication administration 2255 - bed alarming, pt needed to sit on side of bed, requested snacks and more beverage. No other complaints at this time 0050 - resting with eyes closed 0140 - reports "hearing a noise". Explained sound comes from suction canister for external catheter 0228 - denies needs, no complaints at this time 0451 - scheduled medication administration 0538 - scheduled medication administration, snacks per pt request

## 2022-10-25 NOTE — Assessment & Plan Note (Addendum)
-   LDH normal.  Smear showing unremarkable morphology - Hgb 6.3 g/dL on admission; baseline around 8-9 g/dL - iron stores low as well; will give INFED also, 10/25/22 - s/p PRBC however Hgb still downtrending; increasing concern for underlying GIB - GI consulted for further assistance - last colonoscopy Feb 2023 with 3 polyps removed; negative pathology - continue trending Hgb; transfusing for Hgb <8 g/dL given CHF - EGD performed 10/28/2022; unrevealing for etiology of anemia - plan is for outpatient follow up with GI for possible capsule study and/or colonoscopy  - Hgb stable at discharge

## 2022-10-25 NOTE — ED Notes (Signed)
Dr. Sidney Ace @ the bedside.

## 2022-10-25 NOTE — Assessment & Plan Note (Addendum)
-   Home regimen resumed

## 2022-10-25 NOTE — Hospital Course (Addendum)
[name] is a 61 y.o. Caucasian male with medical history significant for COPD, CVA, HTN, CAD who presented to the ER with acute onset of left leg and foot numbness which has been worsening but has been going on for months as well as bilateral lower extremity edema, recent dyspnea with associated nonproductive cough and chest pain.  He has been occasionally expectorating with his cough.  No fever or chills.  He had 3 vaccine injections for COVID-19.  No nausea or vomiting or diarrhea or abdominal pain.  He has been having urinary urgency without dysuria or hematuria or flank pain.  He admits to dyspnea on exertion and mild orthopnea.   In the ER, vital signs were within normal and later respiratory rate was 23 and later BP was 154/108.  Labs revealed hyponatremia of 131 and CO2 of 18.  IR panel revealed low serum iron and high TIBC with low ferritin and low saturation.  CBC showed leukocytosis at 13.9 with hemoglobin of 6.3 and hematocrit 23.2, and platelets were 473.  INR was 1.3 and PT 15.6. Stool Hemoccult came back negative. CXR showed pulmonary vascular congestion.  He was ordered 1 unit PRBC and started on lasix as well.  See below for further A&P.

## 2022-10-25 NOTE — Assessment & Plan Note (Addendum)
continue statin

## 2022-10-25 NOTE — ED Notes (Signed)
Admitting MD at [facility]. Pt standing at [facility].

## 2022-10-25 NOTE — ED Notes (Addendum)
Transfusion complete, CBC ordered for H&H post transfusion, after 2nd unit PRBCs. CBC to be drawn in 2 hours at 1935. Transport initiated.

## 2022-10-25 NOTE — Assessment & Plan Note (Addendum)
-   continue Aricept.

## 2022-10-25 NOTE — Progress Notes (Signed)
Progress Note    [name]   FBX:[id number]  DOB: 23-Aug-1961  DOA: 10/25/2022     0 PCP: [place]  Initial CC: SOB  Hospital Course: [name] is a 61 y.o. Caucasian male with medical history significant for COPD, CVA, HTN, CAD who presented to the ER with acute onset of left leg and foot numbness which has been worsening but has been going on for months as well as bilateral lower extremity edema, recent dyspnea with associated nonproductive cough and chest pain.  He has been occasionally expectorating with his cough.  No fever or chills.  He had 3 vaccine injections for COVID-19.  No nausea or vomiting or diarrhea or abdominal pain.  He has been having urinary urgency without dysuria or hematuria or flank pain.  He admits to dyspnea on exertion and mild orthopnea.   In the ER, vital signs were within normal and later respiratory rate was 23 and later BP was 154/108.  Labs revealed hyponatremia of 131 and CO2 of 18.  IR panel revealed low serum iron and high TIBC with low ferritin and low saturation.  CBC showed leukocytosis at 13.9 with hemoglobin of 6.3 and hematocrit 23.2, and platelets were 473.  INR was 1.3 and PT 15.6. Stool Hemoccult came back negative. CXR showed pulmonary vascular congestion.  He was ordered 1 unit PRBC and started on lasix as well.   Interval History:  Seen in ER this morning. Still SOB and standing over edge of bed leaning forward to help breath better.   Assessment and Plan: * Symptomatic anemia - Hgb 6.3 g/dL on admission; baseline around 8-9 g/dL - s/p 1 unit PRBC; Hgb still low on repeat; will transfuse additional unit - iron stores low as well; will give INFED also  - last colonoscopy Feb 2023 with 3 polyps removed; negative pathology  Sepsis due to pneumonia ([place]) - some concern for infiltrate in RLL vs congestion - continue diuresis and abx for now - trend PCT  Acute diastolic CHF (congestive heart failure) (HCC) - PND,  pulmonary crackles, pulm edema, B/L LE edema, DOE/SOB - recent echo on 10/31: EF 60-65%, Gr II DD. Moderate AS - dilated IVC concerning for RHF due to overload - continue diuresis - patient recommended for cardiology outpt follow up already by PCP  Dyslipidemia - continue statin  GERD without esophagitis - We will continue PPI therapy.  Essential hypertension - We will continue his antihypertensives  Cognitive impairment - continue Aricept.   Old records reviewed in assessment of this patient  Antimicrobials: Azithro 11/8 >> current Rocephin 11/8 >> current   DVT prophylaxis:  Lovenox    Code Status:   Code Status: Full Code  Mobility Assessment (last 72 hours)     Mobility Assessment     Row Name 10/25/22 0806           Does patient have an order for bedrest or is patient medically unstable No - Continue assessment       What is the highest level of mobility based on the progressive mobility assessment? Level 5 (Walks with assist in room/hall) - Balance while stepping forward/back and can walk in room with assist - Complete                Barriers to discharge: Disposition Plan:  Home Status is: Inpt  Objective: Blood pressure 107/73, pulse 89, temperature 98.7 F (37.1 C), temperature source Oral, resp. rate (!) 23, height 6' (1.829 m), weight  113.4 kg, SpO2 90 %.  Examination:  Physical Exam Constitutional:      General: He is not in acute distress.    Appearance: Normal appearance.  HENT:     Head: Normocephalic and atraumatic.     Mouth/Throat:     Mouth: Mucous membranes are moist.  Eyes:     Extraocular Movements: Extraocular movements intact.  Cardiovascular:     Rate and Rhythm: Normal rate and regular rhythm.     Heart sounds: Normal heart sounds.  Pulmonary:     Breath sounds: Rales present. No wheezing.  Abdominal:     General: Bowel sounds are normal. There is distension.     Palpations: Abdomen is soft.     Tenderness: There is  no abdominal tenderness.  Musculoskeletal:        General: Swelling present. Normal range of motion.     Cervical back: Normal range of motion and neck supple.     Comments: 2+ LE edema  Skin:    General: Skin is warm and dry.  Neurological:     General: No focal deficit present.     Mental Status: He is alert.  Psychiatric:        Mood and Affect: Mood normal.        Behavior: Behavior normal.      Consultants:    Procedures:    Data Reviewed: Results for orders placed or performed during the hospital encounter of 10/25/22 (from the past 24 hour(s))  Basic metabolic panel     Status: Abnormal   Collection Time: 10/25/22  2:28 AM  Result Value Ref Range   Sodium 133 (L) 135 - 145 mmol/L   Potassium 3.4 (L) 3.5 - 5.1 mmol/L   Chloride 98 98 - 111 mmol/L   CO2 23 22 - 32 mmol/L   Glucose, Bld 128 (H) 70 - 99 mg/dL   BUN 15 8 - 23 mg/dL   Creatinine, Ser 1.06 0.61 - 1.24 mg/dL   Calcium 8.8 (L) 8.9 - 10.3 mg/dL   GFR, Estimated >60 >60 mL/min   Anion gap 12 5 - 15  CBC     Status: Abnormal   Collection Time: 10/25/22  2:28 AM  Result Value Ref Range   WBC 13.9 (H) 4.0 - 10.5 K/uL   RBC 3.25 (L) 4.22 - 5.81 MIL/uL   Hemoglobin 6.3 (L) 13.0 - 17.0 g/dL   HCT 23.2 (L) 39.0 - 52.0 %   MCV 71.4 (L) 80.0 - 100.0 fL   MCH 19.4 (L) 26.0 - 34.0 pg   MCHC 27.2 (L) 30.0 - 36.0 g/dL   RDW 17.6 (H) 11.5 - 15.5 %   Platelets 473 (H) 150 - 400 K/uL   nRBC 0.0 0.0 - 0.2 %  Troponin I (High Sensitivity)     Status: Abnormal   Collection Time: 10/25/22  2:28 AM  Result Value Ref Range   Troponin I (High Sensitivity) 37 (H) <18 ng/L  Brain natriuretic peptide     Status: Abnormal   Collection Time: 10/25/22  2:28 AM  Result Value Ref Range   B Natriuretic Peptide 702.2 (H) 0.0 - 100.0 pg/mL  Folate     Status: None   Collection Time: 10/25/22  2:28 AM  Result Value Ref Range   Folate 6.4 >5.9 ng/mL  Iron and TIBC     Status: Abnormal   Collection Time: 10/25/22  2:28 AM   Result Value Ref Range   Iron 18 (L) 45 -  182 ug/dL   TIBC 493 (H) 250 - 450 ug/dL   Saturation Ratios 4 (L) 17.9 - 39.5 %   UIBC 475 ug/dL  Ferritin     Status: Abnormal   Collection Time: 10/25/22  2:28 AM  Result Value Ref Range   Ferritin 9 (L) 24 - 336 ng/mL  Reticulocytes     Status: Abnormal   Collection Time: 10/25/22  2:28 AM  Result Value Ref Range   Retic Ct Pct 2.4 0.4 - 3.1 %   RBC. 3.23 (L) 4.22 - 5.81 MIL/uL   Retic Count, Absolute 76.9 19.0 - 186.0 K/uL   Immature Retic Fract 30.0 (H) 2.3 - 15.9 %  Prepare RBC (crossmatch)     Status: None   Collection Time: 10/25/22  4:17 AM  Result Value Ref Range   Order Confirmation      ORDER PROCESSED BY BLOOD BANK Performed at [facility], [address] [place], [place] [address]   Blood culture (routine x 2)     Status: None (Preliminary result)   Collection Time: 10/25/22  4:24 AM   Specimen: BLOOD  Result Value Ref Range   Specimen Description BLOOD RIGHT FA    Special Requests      BOTTLES DRAWN AEROBIC AND ANAEROBIC Blood Culture adequate volume   Culture      NO GROWTH < 12 HOURS Performed at [facility], [address] [place], [place] [address]    Report Status PENDING   Resp Panel by RT-PCR (Flu A&B, Covid) Anterior Nasal Swab     Status: None   Collection Time: 10/25/22  4:24 AM   Specimen: Anterior Nasal Swab  Result Value Ref Range   SARS Coronavirus 2 by RT PCR NEGATIVE NEGATIVE   Influenza A by PCR NEGATIVE NEGATIVE   Influenza B by PCR NEGATIVE NEGATIVE  Vitamin B12     Status: Abnormal   Collection Time: 10/25/22  4:24 AM  Result Value Ref Range   Vitamin B-12 1,277 (H) 180 - 914 pg/mL  Type and screen [place]     Status: None (Preliminary result)   Collection Time: 10/25/22  4:24 AM  Result Value Ref Range   ABO/RH(D) A POS    Antibody Screen NEG    Sample Expiration 10/28/2022,2359    Unit Number [id number]    Blood Component Type RED  CELLS,LR    Unit division 00    Status of Unit ISSUED    Transfusion Status OK TO TRANSFUSE    Crossmatch Result      Compatible Performed at [facility], [address] [place], [place] [address]   Cortisol-am, blood     Status: None   Collection Time: 10/25/22  4:24 AM  Result Value Ref Range   Cortisol - AM 6.8 6.7 - 22.6 ug/dL  Protime-INR     Status: Abnormal   Collection Time: 10/25/22  4:24 AM  Result Value Ref Range   Prothrombin Time 15.6 (H) 11.4 - 15.2 seconds   INR 1.3 (H) 0.8 - 1.2  Troponin I (High Sensitivity)     Status: Abnormal   Collection Time: 10/25/22  4:24 AM  Result Value Ref Range   Troponin I (High Sensitivity) 38 (H) <18 ng/L  Procalcitonin     Status: None   Collection Time: 10/25/22  4:24 AM  Result Value Ref Range   Procalcitonin <0.10 ng/mL  Basic metabolic panel     Status: Abnormal   Collection  Time: 10/25/22  4:24 AM  Result Value Ref Range   Sodium 131 (L) 135 - 145 mmol/L   Potassium 3.6 3.5 - 5.1 mmol/L   Chloride 98 98 - 111 mmol/L   CO2 18 (L) 22 - 32 mmol/L   Glucose, Bld 104 (H) 70 - 99 mg/dL   BUN 13 8 - 23 mg/dL   Creatinine, Ser 0.94 0.61 - 1.24 mg/dL   Calcium 8.6 (L) 8.9 - 10.3 mg/dL   GFR, Estimated >60 >60 mL/min   Anion gap 15 5 - 15  Blood culture (routine x 2)     Status: None (Preliminary result)   Collection Time: 10/25/22  4:45 AM   Specimen: BLOOD  Result Value Ref Range   Specimen Description BLOOD LEFT AC    Special Requests      BOTTLES DRAWN AEROBIC AND ANAEROBIC Blood Culture results may not be optimal due to an excessive volume of blood received in culture bottles   Culture      NO GROWTH < 12 HOURS Performed at [facility], [address] [place], [place] [address]    Report Status PENDING   CBC     Status: Abnormal   Collection Time: 10/25/22  9:36 AM  Result Value Ref Range   WBC 12.8 (H) 4.0 - 10.5 K/uL   RBC 3.24 (L) 4.22 - 5.81 MIL/uL   Hemoglobin 6.6 (L) 13.0 - 17.0 g/dL    HCT 23.1 (L) 39.0 - 52.0 %   MCV 71.3 (L) 80.0 - 100.0 fL   MCH 20.4 (L) 26.0 - 34.0 pg   MCHC 28.6 (L) 30.0 - 36.0 g/dL   RDW 17.3 (H) 11.5 - 15.5 %   Platelets 423 (H) 150 - 400 K/uL   nRBC 0.0 0.0 - 0.2 %    I have Reviewed nursing notes, Vitals, and Lab results since pt's last encounter. Pertinent lab results : see above I have ordered test including BMP, CBC, Mg I have reviewed the last note from staff over past 24 hours I have discussed pt's care plan and test results with nursing staff, case manager   LOS: 0 days   Dwyane Dee, MD [facility] 10/25/2022, 3:20 PM

## 2022-10-25 NOTE — ED Triage Notes (Signed)
EMS brings pt in from home for c/o left leg numbness x 7 months

## 2022-10-26 DIAGNOSIS — I5031 Acute diastolic (congestive) heart failure: Secondary | ICD-10-CM | POA: Diagnosis not present

## 2022-10-26 DIAGNOSIS — K921 Melena: Secondary | ICD-10-CM

## 2022-10-26 DIAGNOSIS — J189 Pneumonia, unspecified organism: Secondary | ICD-10-CM | POA: Diagnosis not present

## 2022-10-26 DIAGNOSIS — A419 Sepsis, unspecified organism: Secondary | ICD-10-CM | POA: Diagnosis not present

## 2022-10-26 DIAGNOSIS — D649 Anemia, unspecified: Secondary | ICD-10-CM | POA: Diagnosis not present

## 2022-10-26 LAB — BASIC METABOLIC PANEL
Anion gap: 9 (ref 5–15)
BUN: 12 mg/dL (ref 8–23)
CO2: 28 mmol/L (ref 22–32)
Calcium: 8.2 mg/dL — ABNORMAL LOW (ref 8.9–10.3)
Chloride: 98 mmol/L (ref 98–111)
Creatinine, Ser: 0.93 mg/dL (ref 0.61–1.24)
GFR, Estimated: >60 mL/min (ref >60)
Glucose, Bld: 114 mg/dL — ABNORMAL HIGH (ref 70–99)
Potassium: 3.1 mmol/L — ABNORMAL LOW (ref 3.5–5.1)
Sodium: 135 mmol/L (ref 135–145)

## 2022-10-26 LAB — TECHNOLOGIST SMEAR REVIEW: Plt Morphology: NORMAL

## 2022-10-26 LAB — CBC WITH DIFFERENTIAL/PLATELET
Abs Immature Granulocytes: 0.1 10*3/uL — ABNORMAL HIGH (ref 0.00–0.07)
Basophils Absolute: 0.1 10*3/uL (ref 0.0–0.1)
Basophils Relative: 1 %
Eosinophils Absolute: 0.4 10*3/uL (ref 0.0–0.5)
Eosinophils Relative: 3 %
HCT: 23.8 % — ABNORMAL LOW (ref 39.0–52.0)
Hemoglobin: 7 g/dL — ABNORMAL LOW (ref 13.0–17.0)
Immature Granulocytes: 1 %
Lymphocytes Relative: 16 %
Lymphs Abs: 2.1 10*3/uL (ref 0.7–4.0)
MCH: 21.2 pg — ABNORMAL LOW (ref 26.0–34.0)
MCHC: 29.4 g/dL — ABNORMAL LOW (ref 30.0–36.0)
MCV: 72.1 fL — ABNORMAL LOW (ref 80.0–100.0)
Monocytes Absolute: 1.8 10*3/uL — ABNORMAL HIGH (ref 0.1–1.0)
Monocytes Relative: 13 %
Neutro Abs: 9 10*3/uL — ABNORMAL HIGH (ref 1.7–7.7)
Neutrophils Relative %: 66 %
Platelets: 380 10*3/uL (ref 150–400)
RBC: 3.3 MIL/uL — ABNORMAL LOW (ref 4.22–5.81)
RDW: 18.3 % — ABNORMAL HIGH (ref 11.5–15.5)
WBC: 13.5 10*3/uL — ABNORMAL HIGH (ref 4.0–10.5)
nRBC: 0 % (ref 0.0–0.2)

## 2022-10-26 LAB — PROCALCITONIN: Procalcitonin: 0.1 ng/mL

## 2022-10-26 LAB — HAPTOGLOBIN: Haptoglobin: 145 mg/dL (ref 32–363)

## 2022-10-26 LAB — MAGNESIUM: Magnesium: 2 mg/dL (ref 1.7–2.4)

## 2022-10-26 LAB — HEMOGLOBIN AND HEMATOCRIT, BLOOD
HCT: 27.3 % — ABNORMAL LOW (ref 39.0–52.0)
Hemoglobin: 8.3 g/dL — ABNORMAL LOW (ref 13.0–17.0)

## 2022-10-26 LAB — LACTATE DEHYDROGENASE: LDH: 118 U/L (ref 98–192)

## 2022-10-26 LAB — PREPARE RBC (CROSSMATCH)

## 2022-10-26 LAB — HIV ANTIBODY (ROUTINE TESTING W REFLEX): HIV Screen 4th Generation wRfx: NONREACTIVE

## 2022-10-26 MED ORDER — POTASSIUM CHLORIDE 10 MEQ/100ML IV SOLN
10.0000 meq | INTRAVENOUS | Status: AC
  Administered 2022-10-26 (×4): 10 meq via INTRAVENOUS
  Filled 2022-10-26 (×4): qty 100

## 2022-10-26 MED ORDER — PANTOPRAZOLE SODIUM 40 MG IV SOLR
40.0000 mg | Freq: Two times a day (BID) | INTRAVENOUS | Status: DC
  Administered 2022-10-26 – 2022-10-27 (×4): 40 mg via INTRAVENOUS
  Filled 2022-10-26 (×4): qty 10

## 2022-10-26 MED ORDER — FUROSEMIDE 10 MG/ML IJ SOLN
40.0000 mg | Freq: Two times a day (BID) | INTRAMUSCULAR | Status: DC
  Administered 2022-10-26 – 2022-10-29 (×6): 40 mg via INTRAVENOUS
  Filled 2022-10-26 (×6): qty 4

## 2022-10-26 MED ORDER — SODIUM CHLORIDE 0.9% IV SOLUTION: Freq: Once | INTRAVENOUS | Status: AC

## 2022-10-26 NOTE — Progress Notes (Signed)
Progress Note    [name]   RUE:[id number]  DOB: [birth year]  DOA: 10/25/2022     1 PCP: [place]  Initial CC: SOB  Hospital Course: [name] is a 61 y.o. Caucasian male with medical history significant for COPD, CVA, HTN, CAD who presented to the ER with acute onset of left leg and foot numbness which has been worsening but has been going on for months as well as bilateral lower extremity edema, recent dyspnea with associated nonproductive cough and chest pain.  He has been occasionally expectorating with his cough.  No fever or chills.  He had 3 vaccine injections for COVID-19.  No nausea or vomiting or diarrhea or abdominal pain.  He has been having urinary urgency without dysuria or hematuria or flank pain.  He admits to dyspnea on exertion and mild orthopnea.   In the ER, vital signs were within normal and later respiratory rate was 23 and later BP was 154/108.  Labs revealed hyponatremia of 131 and CO2 of 18.  IR panel revealed low serum iron and high TIBC with low ferritin and low saturation.  CBC showed leukocytosis at 13.9 with hemoglobin of 6.3 and hematocrit 23.2, and platelets were 473.  INR was 1.3 and PT 15.6. Stool Hemoccult came back negative. CXR showed pulmonary vascular congestion.  He was ordered 1 unit PRBC and started on lasix as well.   Interval History:  No events overnight.  Still feeling somewhat short of breath when seen this morning.  Denies any worsening abdominal distention nor any abdominal pain.  No other swelling noted elsewhere.  No further bowel movement since admission.  Assessment and Plan: * Symptomatic anemia - Hgb 6.3 g/dL on admission; baseline around 8-9 g/dL - iron stores low as well; will give INFED also, 10/25/22 - s/p PRBC however Hgb still downtrending; increasing concern for underlying GIB - GI consulted for further assistance - last colonoscopy Feb 2023 with 3 polyps removed; negative pathology - continue  trending Hgb; transfusing for Hgb <8 g/dL given CHF - checking LDH and peripheral smear - GI planning on EGD once respiratory status improves further   Sepsis due to pneumonia ([place]) - some concern for infiltrate in RLL vs congestion - continue diuresis and abx for now - trend PCT  Acute diastolic CHF (congestive heart failure) (HCC) - PND, pulmonary crackles, pulm edema, B/L LE edema, DOE/SOB - recent echo on 10/31: EF 60-65%, Gr II DD. Moderate AS - dilated IVC concerning for RHF due to overload - continue diuresis; increase lasix; diuresing well - trend BMP on lasix - patient recommended for cardiology outpt follow up already by PCP  Dyslipidemia - continue statin  GERD without esophagitis - continue PPI  Essential hypertension - Continue Lasix; further home medications on hold in setting of possible GI bleed  Cognitive impairment - continue Aricept.   Old records reviewed in assessment of this patient  Antimicrobials: Azithro 11/8 >> current Rocephin 11/8 >> current   DVT prophylaxis:  SCD    Code Status:   Code Status: Full Code  Mobility Assessment (last 72 hours)     Mobility Assessment     Row Name 10/25/22 2212 10/25/22 1815 10/25/22 0806       Does patient have an order for bedrest or is patient medically unstable No - Continue assessment No - Continue assessment No - Continue assessment     What is the highest level of mobility based on the progressive mobility assessment? Level  5 (Walks with assist in room/hall) - Balance while stepping forward/back and can walk in room with assist - Complete Level 4 (Walks with assist in room) - Balance while marching in place and cannot step forward and back - Complete Level 5 (Walks with assist in room/hall) - Balance while stepping forward/back and can walk in room with assist - Complete              Barriers to discharge: Disposition Plan:  Home Status is: Inpt  Objective: Blood pressure 119/75, pulse 94,  temperature 98.3 F (36.8 C), temperature source Oral, resp. rate 18, height 6' (1.829 m), weight 113 kg, SpO2 97 %.  Examination:  Physical Exam Constitutional:      General: He is not in acute distress.    Appearance: Normal appearance.  HENT:     Head: Normocephalic and atraumatic.     Mouth/Throat:     Mouth: Mucous membranes are moist.  Eyes:     Extraocular Movements: Extraocular movements intact.  Cardiovascular:     Rate and Rhythm: Normal rate and regular rhythm.     Heart sounds: Normal heart sounds.  Pulmonary:     Breath sounds: Rales present. No wheezing.  Abdominal:     General: Bowel sounds are normal. There is distension.     Palpations: Abdomen is soft.     Tenderness: There is no abdominal tenderness.  Musculoskeletal:        General: Swelling present. Normal range of motion.     Cervical back: Normal range of motion and neck supple.     Comments: 2+ LE edema  Skin:    General: Skin is warm and dry.  Neurological:     General: No focal deficit present.     Mental Status: He is alert.  Psychiatric:        Mood and Affect: Mood normal.        Behavior: Behavior normal.      Consultants:  GI  Procedures:    Data Reviewed: Results for orders placed or performed during the hospital encounter of 10/25/22 (from the past 24 hour(s))  CBC     Status: Abnormal   Collection Time: 10/25/22  8:24 PM  Result Value Ref Range   WBC 14.1 (H) 4.0 - 10.5 K/uL   RBC 3.63 (L) 4.22 - 5.81 MIL/uL   Hemoglobin 7.7 (L) 13.0 - 17.0 g/dL   HCT 26.3 (L) 39.0 - 52.0 %   MCV 72.5 (L) 80.0 - 100.0 fL   MCH 21.2 (L) 26.0 - 34.0 pg   MCHC 29.3 (L) 30.0 - 36.0 g/dL   RDW 18.2 (H) 11.5 - 15.5 %   Platelets 399 150 - 400 K/uL   nRBC 0.0 0.0 - 0.2 %  HIV Antibody (routine testing w rflx)     Status: None   Collection Time: 10/26/22  5:21 AM  Result Value Ref Range   HIV Screen 4th Generation wRfx Non Reactive Non Reactive  Basic metabolic panel     Status: Abnormal    Collection Time: 10/26/22  5:21 AM  Result Value Ref Range   Sodium 135 135 - 145 mmol/L   Potassium 3.1 (L) 3.5 - 5.1 mmol/L   Chloride 98 98 - 111 mmol/L   CO2 28 22 - 32 mmol/L   Glucose, Bld 114 (H) 70 - 99 mg/dL   BUN 12 8 - 23 mg/dL   Creatinine, Ser 0.93 0.61 - 1.24 mg/dL   Calcium 8.2 (L) 8.9 -  10.3 mg/dL   GFR, Estimated >60 >60 mL/min   Anion gap 9 5 - 15  CBC with Differential/Platelet     Status: Abnormal   Collection Time: 10/26/22  5:21 AM  Result Value Ref Range   WBC 13.5 (H) 4.0 - 10.5 K/uL   RBC 3.30 (L) 4.22 - 5.81 MIL/uL   Hemoglobin 7.0 (L) 13.0 - 17.0 g/dL   HCT 23.8 (L) 39.0 - 52.0 %   MCV 72.1 (L) 80.0 - 100.0 fL   MCH 21.2 (L) 26.0 - 34.0 pg   MCHC 29.4 (L) 30.0 - 36.0 g/dL   RDW 18.3 (H) 11.5 - 15.5 %   Platelets 380 150 - 400 K/uL   nRBC 0.0 0.0 - 0.2 %   Neutrophils Relative % 66 %   Neutro Abs 9.0 (H) 1.7 - 7.7 K/uL   Lymphocytes Relative 16 %   Lymphs Abs 2.1 0.7 - 4.0 K/uL   Monocytes Relative 13 %   Monocytes Absolute 1.8 (H) 0.1 - 1.0 K/uL   Eosinophils Relative 3 %   Eosinophils Absolute 0.4 0.0 - 0.5 K/uL   Basophils Relative 1 %   Basophils Absolute 0.1 0.0 - 0.1 K/uL   Immature Granulocytes 1 %   Abs Immature Granulocytes 0.10 (H) 0.00 - 0.07 K/uL  Magnesium     Status: None   Collection Time: 10/26/22  5:21 AM  Result Value Ref Range   Magnesium 2.0 1.7 - 2.4 mg/dL  Prepare RBC (crossmatch)     Status: None   Collection Time: 10/26/22  8:30 AM  Result Value Ref Range   Order Confirmation      ORDER PROCESSED BY BLOOD BANK Performed at [facility], [address] [place], [place] [address]     I have Reviewed nursing notes, Vitals, and Lab results since pt's last encounter. Pertinent lab results : see above I have ordered test including BMP, CBC, Mg I have reviewed the last note from staff over past 24 hours I have discussed pt's care plan and test results with nursing staff, case manager   LOS: 1 day   Dwyane Dee, MD [facility] 10/26/2022, 4:07 PM

## 2022-10-26 NOTE — Progress Notes (Addendum)
1906 - received report, pt sitting on bedside 1945 - obtain vital signs, pt requesting sandwich explaining he did not eat dinner, explained diet is NPO with sips and chips.  2030 - scheduled medication administration, requesting sprite, re-educated pt on diet. Noted to be frustrated. 2205 - assisted pt with phone, family calling for update 2323 - pt c/o pain to bilateral feet. PRN analgesic administered per physician's order 0352 - pt resting in bed with eyes closed awakened for medication administration 0438 - pt resting in bed with eyes closed awakened for medication administration 0540 - pt awakened to obtain VS, administer medication.  Pt asks "is there anyway I can get something to drink".  Explained to pt diet status pending possible procedure.  Pt verbalizes understanding.  This RN asks which procedure may be down.  Pt explains "the camera down my throat".

## 2022-10-26 NOTE — Consult Note (Signed)
   Heart Failure Nurse Navigator Note  HFpEF 50 to 65%.  Grade 2 diastolic dysfunction.  Right ventricular systolic function is mildly reduced.  Right ventricular size is mildly enlarged.  Mild mitral stenosis.  Moderate aortic stenosis.  He presented to the emergency room with complaints of increasing shortness of breath and lower extremity edema.  Chest x-ray revealed vascular congestion.  BNP 702.  Hemoglobin was 6.3.  Comorbidities:  COPD CVA Coronary artery disease  Medications:  Furosemide 20 mg IV every 12 hours Potassium chloride 10 mEq x 4  Labs:  Sodium 135, potassium 3.1, chloride 98, CO2 28, BUN 12, creatinine 0.93 Weight is 113 kg Blood pressure 124/76 Intake is 1093 mL Output 3025 mL  Initial meeting with patient who was sitting on the side of the bed, states that he is feeling better than when he came in.  There is no family or support at bedside, patient states all his family members are deceased.  Patient states that he has never been told in the past that he has heart failure so this is a new term for him, discussed what it means.  Went over diet.  Since patient is single does not eat at home tends to get his meals from a local convenience store.  Discussed making wise choices.  Went over 2000 mg sodium restriction.  Also talked about fluid restriction.  Patient states that he will drink 2-16 ounce glasses of water and in the evening will drink 2 to 3-40 ounce beers.  He states that he drinks the beer to relax himself in the evening.  Went over Network engineer of living with heart failure teaching booklet, zone magnet, info on low-sodium and heart failure along with weight chart.  States that he only went to the eighth grade and is not a good reader.  States that he was the one that was not interested in school and did not care about learning.  When I asked him if there was any support or any family members that the teaching materials could be given to he says  that he has no one.   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  Continue to follow along.  He has follow-up in the outpatient heart failure clinic on November 17 at 1 PM.  He has a 26 percent no-show which is 10 out of 38 appointments.  Pricilla Riffle RN CHFN

## 2022-10-26 NOTE — Consult Note (Signed)
Lucilla Lame, MD [facility]  [address], [place], [place] [address] Phone: [phone] Fax : [phone]  Consultation  Referring Provider:     Dr. Sabino Gasser Primary Care Physician:  [facility], [facility] Primary Gastroenterologist:  Dr. Allen Norris         Reason for Consultation:     Anemia  Date of Admission:  10/25/2022 Date of Consultation:  10/26/2022         HPI:   [name] is a 61 y.o. male seeing me for a colonoscopy of the past with polyps removed.  The patient had his colonoscopy back in February of this year.  She has a history of hypertension COPD and a CVA with admission to the ER for foot swelling also chronic numbness of his leg with chest pain and shortness of breath.  He also had stated that he was having dark stools. The patient states to me today that he had not had a bowel movement today or yesterday but had dark stools before that but cannot quantify the amount.  He also denies taking any anti-inflammatories such as Advil Aleve Motrin BCs or Goody powders. Patient has been receiving blood but his blood count dating back to June showed:  Component     Latest Ref Rng 05/31/2020 06/01/2020 06/03/2021 06/04/2021 06/05/2021  Hemoglobin     13.0 - 17.0 g/dL 12.3 (L)  11.5 (L)  9.6 (L)  8.3 (L)  8.7 (L)   HCT     39.0 - 52.0 % 35.7 (L)  33.2 (L)  30.9 (L)  26.2 (L)  28.8 (L)    Component     Latest Ref Rng 08/14/2022 10/25/2022 10/26/2022  Hemoglobin     13.0 - 17.0 g/dL 9.6 (L)  7.7 (L)  7.0 (L)   Hemoglobin       6.6 (L)    Hemoglobin       6.3 (L)    HCT     39.0 - 52.0 % 31.0 (L)  26.3 (L)  23.8 (L)   HCT       23.1 (L)    HCT       23.2 (L)      The patient continues to remain short of breath despite blood transfusions.  The patient also has some concern for sepsis due to pneumonia.  He has also had a admitting diagnosis of acute diastolic CHF.   Past Medical History:  Diagnosis Date   COPD (chronic obstructive pulmonary disease) (HCC)     CVA (cerebral infarction)    Headache    mild, since stroke 2012   Hypertension    MI (myocardial infarction) ([place]) 05/09/2012   Reading difficulty    pt reports he reads at "about a second grade level"   Stroke [facility]) 2012   numbness to left hand    Wears dentures    full upper and lower    Past Surgical History:  Procedure Laterality Date   CAROTID ENDARTERECTOMY Left    2012 or 2013   CHOLECYSTECTOMY N/A 07/26/2016   Procedure: LAPAROSCOPIC CHOLECYSTECTOMY;  Surgeon: Florene Glen, MD;  Location: [facility] ORS;  Service: General;  Laterality: N/A;   COLONOSCOPY WITH PROPOFOL N/A 02/09/2022   Procedure: COLONOSCOPY WITH PROPOFOL;  Surgeon: Lucilla Lame, MD;  Location: [facility] ENDOSCOPY;  Service: Endoscopy;  Laterality: N/A;   CORONARY ARTERY BYPASS GRAFT  05/10/2012   3 vessel    Prior to Admission medications   Medication Sig  Start Date End Date Taking? Authorizing Provider  amLODipine (NORVASC) 5 MG tablet Take 1 tablet (5 mg total) by mouth daily. 06/05/21 10/25/22 Yes Lavina Hamman, MD  aspirin EC 81 MG EC tablet Take 1 tablet (81 mg total) by mouth daily. 07/27/16  Yes Vaughan Basta, MD  cetirizine (ZYRTEC) 10 MG tablet Take 10 mg by mouth daily. 08/04/22  Yes [provider]  donepezil (ARICEPT) 10 MG tablet Take 10 mg by mouth daily.    Yes [provider]  fluticasone (FLONASE) 50 MCG/ACT nasal spray Place 2 sprays into both nostrils daily. 08/04/22  Yes [provider]  Melatonin 10 MG CAPS Take 1 capsule by mouth at bedtime. 08/04/22  Yes [provider]  metoprolol succinate (TOPROL-XL) 25 MG 24 hr tablet Take 25 mg by mouth daily. 05/24/21  Yes [provider]  omeprazole (PRILOSEC) 20 MG capsule Take 1 capsule (20 mg total) by mouth 2 (two) times daily before a meal. 06/05/21  Yes Lavina Hamman, MD  pravastatin (PRAVACHOL) 20 MG tablet Take 20 mg by mouth at bedtime.    Yes [provider]  TRELEGY ELLIPTA  100-62.5-25 MCG/INH AEPB Inhale 1 puff into the lungs daily. 05/24/21  Yes [provider]  vitamin B-12 (CYANOCOBALAMIN) 1000 MCG tablet Take 1 tablet (1,000 mcg total) by mouth daily. 06/05/21  Yes Lavina Hamman, MD  albuterol (VENTOLIN HFA) 108 (90 Base) MCG/ACT inhaler Inhale 2 puffs into the lungs every 4 (four) hours as needed for wheezing or shortness of breath. 08/14/22   Armando Reichert, MD    Family History  Problem Relation Age of Onset   Pneumonia Mother      Social History   Tobacco Use   Smoking status: Former    Packs/day: 4.00    Years: 43.00    Total pack years: 172.00    Types: Cigarettes    Quit date: 1993    Years since quitting: 30.8   Smokeless tobacco: Current    Types: Snuff   Tobacco comments:    changed to dip  Vaping Use   Vaping Use: Never used  Substance Use Topics   Alcohol use: Yes    Alcohol/week: 12.0 standard drinks of alcohol    Types: 12 Cans of beer per week    Comment: 2 40oz beers 10/25/22   Drug use: No    Allergies as of 10/25/2022 - Review Complete 10/25/2022  Allergen Reaction Noted   Other  01/17/2022    Review of Systems:    All systems reviewed and negative except where noted in HPI.   Physical Exam:  Vital signs in last 24 hours: Temp:  [97.6 F (36.4 C)-99.2 F (37.3 C)] 99.2 F (37.3 C) (11/09 1127) Pulse Rate:  [82-105] 94 (11/09 1127) Resp:  [15-24] 18 (11/09 1127) BP: (107-163)/(65-98) 124/76 (11/09 1127) SpO2:  [90 %-100 %] 100 % (11/09 1127) Weight:  [102 kg-113.1 kg] 113 kg (11/09 0500) Last BM Date : 10/25/22 General:   Pleasant, cooperative in NAD Head:  Normocephalic and atraumatic. Eyes:   No icterus.   Conjunctiva pink. PERRLA. Ears:  Normal auditory acuity. Neck:  Supple; no masses or thyroidomegaly Lungs: Tachypnea with rales Heart:  Regular rate and rhythm;  Without murmur, clicks, rubs or gallops Abdomen:  Soft, nondistended, nontender. Normal bowel sounds. No appreciable masses or  hepatomegaly.  No rebound or guarding.  Rectal:  Not performed. Msk:  Symmetrical without gross deformities.    Extremities:  Without  edema, cyanosis or clubbing. Neurologic:  Alert and oriented x3;  grossly normal neurologically. Skin:  Intact without significant lesions or rashes. Cervical Nodes:  No significant cervical adenopathy. Psych:  Alert and cooperative. Normal affect.  LAB RESULTS: Recent Labs    10/25/22 0936 10/25/22 2024 10/26/22 0521  WBC 12.8* 14.1* 13.5*  HGB 6.6* 7.7* 7.0*  HCT 23.1* 26.3* 23.8*  PLT 423* 399 380   BMET Recent Labs    10/25/22 0228 10/25/22 0424 10/26/22 0521  NA 133* 131* 135  K 3.4* 3.6 3.1*  CL 98 98 98  CO2 23 18* 28  GLUCOSE 128* 104* 114*  BUN '15 13 12  '$ CREATININE 1.06 0.94 0.93  CALCIUM 8.8* 8.6* 8.2*   LFT No results for input(s): "PROT", "ALBUMIN", "AST", "ALT", "ALKPHOS", "BILITOT", "BILIDIR", "IBILI" in the last 72 hours. PT/INR Recent Labs    10/25/22 0424  LABPROT 15.6*  INR 1.3*    STUDIES: DG Chest 1 View  Result Date: 10/25/2022 CLINICAL DATA:  Bilateral feet swelling; shortness of breath EXAM: CHEST  1 VIEW COMPARISON:  CT chest 10/19/2022 and radiographs 06/03/2021 FINDINGS: Sternotomy and CABG. Stable cardiomegaly. Pulmonary vascular congestion. Small right pleural effusion and associated atelectasis/consolidation. Hyperinflation. No pneumothorax. No acute osseous abnormality. IMPRESSION: Small right pleural effusion and associated atelectasis or consolidation. Pneumonia is difficult to exclude. COPD. Cardiomegaly and pulmonary vascular congestion. Electronically Signed   By: Placido Sou M.D.   On: 10/25/2022 03:06      Impression / Plan:   Assessment: Principal Problem:   Symptomatic anemia Active Problems:   Cognitive impairment   Sepsis due to pneumonia (HCC)   Acute diastolic CHF (congestive heart failure) (HCC)   Essential hypertension   GERD without esophagitis   Dyslipidemia   [name] is a 61 y.o. y/o male with admitted with multiple medical problems and anemia with a low MCV.  The patient has been reporting black stools although he has not had any since admission.  He had a colonoscopy in February and I am not inclined to repeat that procedure at this time without any new lower GI symptoms.  Plan:  The patient should be considered for an upper endoscopy when his respiratory status permits.  The patient is clearly short of breath today while I am talking to him.  PPI IV twice daily  Continue serial CBCs and transfuse PRN Avoid NSAIDs Maintain 2 large-bore IV lines Please page GI with any acute hemodynamic changes, or signs of active GI bleeding   Thank you for involving me in the care of this patient.      LOS: 1 day   Lucilla Lame, MD, [facility] 10/26/2022, 12:36 PM,  Pager [phone] 7am-5pm  Check AMION for 5pm -7am coverage and on weekends   Note: This dictation was prepared with Dragon dictation along with smaller phrase technology. Any transcriptional errors that result from this process are unintentional.

## 2022-10-27 DIAGNOSIS — I5031 Acute diastolic (congestive) heart failure: Secondary | ICD-10-CM | POA: Diagnosis not present

## 2022-10-27 DIAGNOSIS — D649 Anemia, unspecified: Secondary | ICD-10-CM | POA: Diagnosis not present

## 2022-10-27 DIAGNOSIS — J189 Pneumonia, unspecified organism: Secondary | ICD-10-CM | POA: Diagnosis not present

## 2022-10-27 DIAGNOSIS — A419 Sepsis, unspecified organism: Secondary | ICD-10-CM | POA: Diagnosis not present

## 2022-10-27 LAB — BPAM RBC
Blood Product Expiration Date: 202311122359
Blood Product Expiration Date: 202311202359
Blood Product Expiration Date: 202312082359
ISSUE DATE / TIME: 202311080618
ISSUE DATE / TIME: 202311081600
ISSUE DATE / TIME: 202311091109
Unit Type and Rh: 600
Unit Type and Rh: 600
Unit Type and Rh: 6200

## 2022-10-27 LAB — BASIC METABOLIC PANEL
Anion gap: 7 (ref 5–15)
BUN: 8 mg/dL (ref 8–23)
CO2: 30 mmol/L (ref 22–32)
Calcium: 8.4 mg/dL — ABNORMAL LOW (ref 8.9–10.3)
Chloride: 101 mmol/L (ref 98–111)
Creatinine, Ser: 0.91 mg/dL (ref 0.61–1.24)
GFR, Estimated: >60 mL/min (ref >60)
Glucose, Bld: 91 mg/dL (ref 70–99)
Potassium: 3.2 mmol/L — ABNORMAL LOW (ref 3.5–5.1)
Sodium: 138 mmol/L (ref 135–145)

## 2022-10-27 LAB — CBC WITH DIFFERENTIAL/PLATELET
Abs Immature Granulocytes: 0.13 10*3/uL — ABNORMAL HIGH (ref 0.00–0.07)
Basophils Absolute: 0.2 10*3/uL — ABNORMAL HIGH (ref 0.0–0.1)
Basophils Relative: 1 %
Eosinophils Absolute: 0.4 10*3/uL (ref 0.0–0.5)
Eosinophils Relative: 3 %
HCT: 26.6 % — ABNORMAL LOW (ref 39.0–52.0)
Hemoglobin: 7.9 g/dL — ABNORMAL LOW (ref 13.0–17.0)
Immature Granulocytes: 1 %
Lymphocytes Relative: 18 %
Lymphs Abs: 2.4 10*3/uL (ref 0.7–4.0)
MCH: 21.5 pg — ABNORMAL LOW (ref 26.0–34.0)
MCHC: 29.7 g/dL — ABNORMAL LOW (ref 30.0–36.0)
MCV: 72.5 fL — ABNORMAL LOW (ref 80.0–100.0)
Monocytes Absolute: 1.9 10*3/uL — ABNORMAL HIGH (ref 0.1–1.0)
Monocytes Relative: 14 %
Neutro Abs: 8.7 10*3/uL — ABNORMAL HIGH (ref 1.7–7.7)
Neutrophils Relative %: 63 %
Platelets: 404 10*3/uL — ABNORMAL HIGH (ref 150–400)
RBC: 3.67 MIL/uL — ABNORMAL LOW (ref 4.22–5.81)
RDW: 18.6 % — ABNORMAL HIGH (ref 11.5–15.5)
WBC: 13.7 10*3/uL — ABNORMAL HIGH (ref 4.0–10.5)
nRBC: 0.1 % (ref 0.0–0.2)

## 2022-10-27 LAB — PROCALCITONIN: Procalcitonin: 0.11 ng/mL

## 2022-10-27 LAB — TYPE AND SCREEN
ABO/RH(D): A POS
Antibody Screen: NEGATIVE
Unit division: 0
Unit division: 0
Unit division: 0

## 2022-10-27 LAB — OCCULT BLOOD X 1 CARD TO LAB, STOOL: Fecal Occult Bld: POSITIVE — AB

## 2022-10-27 LAB — MAGNESIUM: Magnesium: 2.1 mg/dL (ref 1.7–2.4)

## 2022-10-27 MED ORDER — DONEPEZIL HCL 5 MG PO TABS
10.0000 mg | ORAL_TABLET | Freq: Every day | ORAL | Status: DC
  Administered 2022-10-27 – 2022-10-29 (×3): 10 mg via ORAL
  Filled 2022-10-27 (×3): qty 2

## 2022-10-27 MED ORDER — POTASSIUM CHLORIDE CRYS ER 20 MEQ PO TBCR
40.0000 meq | EXTENDED_RELEASE_TABLET | Freq: Once | ORAL | Status: AC
  Administered 2022-10-27: 40 meq via ORAL
  Filled 2022-10-27: qty 2

## 2022-10-27 NOTE — TOC Initial Note (Signed)
Transition of Care [facility]) - Initial/Assessment Note    Patient Details  Name: [name] MRN: [id number] Date of Birth: 1961-02-28  Transition of Care [facility]) CM/SW Contact:    Magnus Ivan, LCSW Phone Number: 10/27/2022, 11:29 AM  Clinical Narrative:                 Patient with SDOH needs for food insecurity. CSW spoke with patient. Patient lives alone and drives himself to appointments. PCP and Pharmacy is [facility] on [address]. No DME.  Patient is interested in food resources. Patient states he had a referral to [facility] in the past but they told him he lived too far away. He is interested in food resource list being added to his AVS. Info added by CSW to be included in AVS. TOC will continue to follow for additional needs.   Expected Discharge Plan: Home/Self Care Barriers to Discharge: Continued Medical Work up   Patient Goals and CMS Choice   CMS Medicare.gov Compare Post Acute Care list provided to:: Patient Choice offered to / list presented to : Patient  Expected Discharge Plan and Services Expected Discharge Plan: Home/Self Care       Living arrangements for the past 2 months: Single Family Home                                      Prior Living Arrangements/Services Living arrangements for the past 2 months: Single Family Home Lives with:: Self Patient language and need for interpreter reviewed:: Yes Do you feel safe going back to the place where you live?: Yes      Need for Family Participation in Patient Care: Yes (Comment) Care giver support system in place?: Yes (comment)   Criminal Activity/Legal Involvement Pertinent to Current Situation/Hospitalization: No - Comment as needed  Activities of Daily Living Home Assistive Devices/Equipment: Oxygen ADL Screening (condition at time of admission) Patient's cognitive ability adequate to safely complete daily activities?: Yes Is the patient deaf or have  difficulty hearing?: No Does the patient have difficulty seeing, even when wearing glasses/contacts?: No Does the patient have difficulty concentrating, remembering, or making decisions?: No Patient able to express need for assistance with ADLs?: Yes Does the patient have difficulty dressing or bathing?: No Independently performs ADLs?: Yes (appropriate for developmental age) Does the patient have difficulty walking or climbing stairs?: Yes Weakness of Legs: Both Weakness of Arms/Hands: None  Permission Sought/Granted Permission sought to share information with : Facility Art therapist granted to share information with : Yes, Verbal Permission Granted     Permission granted to share info w AGENCY: as needed        Emotional Assessment       Orientation: : Oriented to Self, Oriented to Place, Oriented to  Time, Oriented to Situation Alcohol / Substance Use: Not Applicable Psych Involvement: No (comment)  Admission diagnosis:  Symptomatic anemia [D64.9] Patient Active Problem List   Diagnosis Date Noted   Melena 10/26/2022   Symptomatic anemia 10/25/2022   Sepsis due to pneumonia ([place]) 37/90/2409   Acute diastolic CHF (congestive heart failure) ([place]) 10/25/2022   Essential hypertension 10/25/2022   GERD without esophagitis 10/25/2022   Dyslipidemia 10/25/2022   Special screening for malignant neoplasms, colon    Polyp of sigmoid colon    Orthostatic hypotension 06/04/2021   Alcohol abuse    Syncope and collapse 05/31/2020  AKI (acute kidney injury) ([place]) 05/31/2020   Hypokalemia 05/31/2020   Memory loss of unknown cause 10/14/2018   Sleeping difficulty 10/14/2018   Mild aortic valve stenosis 10/08/2018   Hyperlipidemia 03/27/2018   Cognitive impairment 03/12/2018   History of stroke 03/12/2018   Hypertension 03/01/2018   Atherosclerosis of native arteries of extremity with intermittent claudication ([place]) 03/01/2018   Atherosclerotic peripheral  vascular disease with intermittent claudication ([place]) 01/03/2018   Benign essential HTN 12/07/2017   Bilateral carotid artery stenosis 12/07/2017   SOBOE (shortness of breath on exertion) 12/07/2017   Acute cholecystitis 07/24/2016   Coronary artery disease involving coronary bypass graft of native heart without angina pectoris    Tobacco abuse    PCP:  [facility], [place]:   [place], [place] [address] [place] [address] Phone: [phone] Fax: [phone]     Social Determinants of Health (SDOH) Interventions    Readmission Risk Interventions    10/27/2022   11:28 AM  Readmission Risk Prevention Plan  Transportation Screening Complete  PCP or Specialist Appt within 5-7 Days Complete  Home Care Screening Complete  Medication Review (RN CM) Complete

## 2022-10-27 NOTE — Progress Notes (Signed)
Progress Note    Dempsy Damiano   OEV:[id number]  DOB: Oct 18, 1961  DOA: 10/25/2022     2 PCP: [facility]  Initial CC: SOB  Hospital Course: Mr. Carton is a 61 y.o. Caucasian male with medical history significant for COPD, CVA, HTN, CAD who presented to the ER with acute onset of left leg and foot numbness which has been worsening but has been going on for months as well as bilateral lower extremity edema, recent dyspnea with associated nonproductive cough and chest pain.  He has been occasionally expectorating with his cough.  No fever or chills.  He had 3 vaccine injections for COVID-19.  No nausea or vomiting or diarrhea or abdominal pain.  He has been having urinary urgency without dysuria or hematuria or flank pain.  He admits to dyspnea on exertion and mild orthopnea.   In the ER, vital signs were within normal and later respiratory rate was 23 and later BP was 154/108.  Labs revealed hyponatremia of 131 and CO2 of 18.  IR panel revealed low serum iron and high TIBC with low ferritin and low saturation.  CBC showed leukocytosis at 13.9 with hemoglobin of 6.3 and hematocrit 23.2, and platelets were 473.  INR was 1.3 and PT 15.6. Stool Hemoccult came back negative. CXR showed pulmonary vascular congestion.  He was ordered 1 unit PRBC and started on lasix as well.   Interval History:  No events overnight. Has been diuresing well. Abdomen and legs are softer and his work of breathing is much better today. No further SOB noted.   Assessment and Plan: * Symptomatic anemia - LDH normal.  Smear showing unremarkable morphology - Hgb 6.3 g/dL on admission; baseline around 8-9 g/dL - iron stores low as well; will give INFED also, 10/25/22 - s/p PRBC however Hgb still downtrending; increasing concern for underlying GIB - GI consulted for further assistance - last colonoscopy Feb 2023 with 3 polyps removed; negative pathology - continue trending Hgb; transfusing for Hgb <8  g/dL given CHF - GI planning on EGD once respiratory status improves further; tentative plan is for 10/28/2022  Sepsis due to pneumonia (HCC)-resolved as of 10/27/2022 - some concern for infiltrate in RLL vs congestion - continue diuresis and abx for now; complete 5 days antibiotics - trend PCT  Acute diastolic CHF (congestive heart failure) (HCC) - PND, pulmonary crackles, pulm edema, B/L LE edema, DOE/SOB - recent echo on 10/31: EF 60-65%, Gr II DD. Moderate AS - dilated IVC concerning for RHF due to overload - continue diuresis; diuresing well - trend BMP on lasix - patient recommended for cardiology outpt follow up already by PCP  Dyslipidemia - continue statin  GERD without esophagitis - continue PPI  Essential hypertension - Continue Lasix; further home medications on hold in setting of possible GI bleed  Cognitive impairment - continue Aricept.   Old records reviewed in assessment of this patient  Antimicrobials: Azithro 11/8 >> current Rocephin 11/8 >> current   DVT prophylaxis:  SCD    Code Status:   Code Status: Full Code  Mobility Assessment (last 72 hours)     Mobility Assessment     Row Name 10/26/22 2030 10/26/22 0828 10/25/22 2212 10/25/22 1815 10/25/22 0806   Does patient have an order for bedrest or is patient medically unstable No - Continue assessment No - Continue assessment No - Continue assessment No - Continue assessment No - Continue assessment   What is the highest level of mobility based  on the progressive mobility assessment? Level 5 (Walks with assist in room/hall) - Balance while stepping forward/back and can walk in room with assist - Complete Level 5 (Walks with assist in room/hall) - Balance while stepping forward/back and can walk in room with assist - Complete Level 5 (Walks with assist in room/hall) - Balance while stepping forward/back and can walk in room with assist - Complete Level 4 (Walks with assist in room) - Balance while  marching in place and cannot step forward and back - Complete Level 5 (Walks with assist in room/hall) - Balance while stepping forward/back and can walk in room with assist - Complete            Barriers to discharge: Disposition Plan:  Home Status is: Inpt  Objective: Blood pressure (!) 120/57, pulse 98, temperature 98.3 F (36.8 C), temperature source Oral, resp. rate 16, height 6' (1.829 m), weight 113 kg, SpO2 92 %.  Examination:  Physical Exam Constitutional:      General: He is not in acute distress.    Appearance: Normal appearance.  HENT:     Head: Normocephalic and atraumatic.     Mouth/Throat:     Mouth: Mucous membranes are moist.  Eyes:     Extraocular Movements: Extraocular movements intact.  Cardiovascular:     Rate and Rhythm: Normal rate and regular rhythm.     Heart sounds: Normal heart sounds.  Pulmonary:     Breath sounds: No wheezing.     Comments: Improved sounds overall Abdominal:     General: Bowel sounds are normal. There is distension.     Palpations: Abdomen is soft.     Tenderness: There is no abdominal tenderness.     Comments: Improved distension and more soft  Musculoskeletal:        General: Swelling present. Normal range of motion.     Cervical back: Normal range of motion and neck supple.     Comments: 1-2+ LE edema (improving)  Skin:    General: Skin is warm and dry.  Neurological:     General: No focal deficit present.     Mental Status: He is alert.  Psychiatric:        Mood and Affect: Mood normal.        Behavior: Behavior normal.      Consultants:  GI  Procedures:    Data Reviewed: Results for orders placed or performed during the hospital encounter of 10/25/22 (from the past 24 hour(s))  Procalcitonin - Baseline     Status: None   Collection Time: 10/26/22  4:05 PM  Result Value Ref Range   Procalcitonin 0.10 ng/mL  Hemoglobin and hematocrit, blood     Status: Abnormal   Collection Time: 10/26/22  4:05 PM   Result Value Ref Range   Hemoglobin 8.3 (L) 13.0 - 17.0 g/dL   HCT 27.3 (L) 39.0 - 52.0 %  Lactate dehydrogenase     Status: None   Collection Time: 10/26/22  4:09 PM  Result Value Ref Range   LDH 118 98 - 192 U/L  Technologist smear review     Status: None   Collection Time: 10/26/22  4:09 PM  Result Value Ref Range   WBC MORPHOLOGY MORPHOLOGY UNREMARKABLE    RBC MORPHOLOGY MORPHOLOGY UNREMARKABLE    Plt Morphology Normal platelet morphology    Clinical Information anemia   Basic metabolic panel     Status: Abnormal   Collection Time: 10/27/22  4:03 AM  Result Value  Ref Range   Sodium 138 135 - 145 mmol/L   Potassium 3.2 (L) 3.5 - 5.1 mmol/L   Chloride 101 98 - 111 mmol/L   CO2 30 22 - 32 mmol/L   Glucose, Bld 91 70 - 99 mg/dL   BUN 8 8 - 23 mg/dL   Creatinine, Ser 0.91 0.61 - 1.24 mg/dL   Calcium 8.4 (L) 8.9 - 10.3 mg/dL   GFR, Estimated >60 >60 mL/min   Anion gap 7 5 - 15  CBC with Differential/Platelet     Status: Abnormal   Collection Time: 10/27/22  4:03 AM  Result Value Ref Range   WBC 13.7 (H) 4.0 - 10.5 K/uL   RBC 3.67 (L) 4.22 - 5.81 MIL/uL   Hemoglobin 7.9 (L) 13.0 - 17.0 g/dL   HCT 26.6 (L) 39.0 - 52.0 %   MCV 72.5 (L) 80.0 - 100.0 fL   MCH 21.5 (L) 26.0 - 34.0 pg   MCHC 29.7 (L) 30.0 - 36.0 g/dL   RDW 18.6 (H) 11.5 - 15.5 %   Platelets 404 (H) 150 - 400 K/uL   nRBC 0.1 0.0 - 0.2 %   Neutrophils Relative % 63 %   Neutro Abs 8.7 (H) 1.7 - 7.7 K/uL   Lymphocytes Relative 18 %   Lymphs Abs 2.4 0.7 - 4.0 K/uL   Monocytes Relative 14 %   Monocytes Absolute 1.9 (H) 0.1 - 1.0 K/uL   Eosinophils Relative 3 %   Eosinophils Absolute 0.4 0.0 - 0.5 K/uL   Basophils Relative 1 %   Basophils Absolute 0.2 (H) 0.0 - 0.1 K/uL   Immature Granulocytes 1 %   Abs Immature Granulocytes 0.13 (H) 0.00 - 0.07 K/uL  Magnesium     Status: None   Collection Time: 10/27/22  4:03 AM  Result Value Ref Range   Magnesium 2.1 1.7 - 2.4 mg/dL  Procalcitonin     Status: None    Collection Time: 10/27/22  4:03 AM  Result Value Ref Range   Procalcitonin 0.11 ng/mL  Occult blood card to lab, stool RN will collect     Status: Abnormal   Collection Time: 10/27/22 12:30 PM  Result Value Ref Range   Fecal Occult Bld POSITIVE (A) NEGATIVE    I have Reviewed nursing notes, Vitals, and Lab results since pt's last encounter. Pertinent lab results : see above I have ordered test including BMP, CBC, Mg I have reviewed the last note from staff over past 24 hours I have discussed pt's care plan and test results with nursing staff, case manager   LOS: 2 days   Dwyane Dee, MD [facility] 10/27/2022, 3:10 PM

## 2022-10-27 NOTE — Discharge Instructions (Signed)
Hold aspirin until seen by gastroenterology at follow up    Lena Name: [facility] Agency Address: [address] [place], [place] [address] Phone: [phone] Website: [email or web address] Service(s) Offered: Housing services, self-sufficiency, congregate meal  program, weatherization program, Administrator, sports program, emergency food assistance,  housing counseling, home ownership program, wheels -towork program. Meals free for 60 and older at various  locations from 9am-1pm, Monday-Friday:  [employer], [place], [place], [address], [place]  [facility], [place] [phone] [address], [address] [place], [place]  Agency Name: [facility] Address: [place] [address] [place], [place], [place] [address] Phone: [phone] Website: [email or web address] Service(s) Offered: Home delivered hot, frozen, and emergency  meals. Grocery assistance program which matches  volunteers one-on-one with seniors unable to grocery shop  for themselves. Must be 60 years and older; less than 20  hours of in-home aide service, limited or no driving ability;  live alone or with someone with a disability; live in  [place].  Agency Name: Software engineer ([place]) Address: [address] [place], [place] [address] Phone: [phone] Service(s) Offered: Food is served to [place], homeless, elderly, and low  income people in the community every Saturday (11:30  am-12:30 pm) and Sunday (12:30 pm-1:30pm). Volunteers  also offer help and encouragement in seeking employment,  and spiritual guidance. April 12, 2017 8  Agency Name: [employer] Address: [address] [place], [place] [address] Phone: [phone] Service(s) Offered: Child support  services; child welfare services; food stamps;  Medicaid; work first family assistance; and aid with fuel,  rent, food and medicine.  Agency Name: Chemical engineer Address: [address] [place], [place] Phone: [phone] Website: [email or web address] Services Offered: Monday 10:00am-12:00, 8:00pm-9:00pm, and Friday  10:00am-12:00.  Agency Name: [employer] [facility] [place] Address: [address], [place], [place] [address] Phone: [phone] Website: [email or web address] Service(s) Offered: Serves weekday meals, open from 11:30 am- 1:00 pm., and  6:30-7:30pm, Monday-Wednesday-Friday distributes food  3:30-6pm, Monday-Wednesday-Friday.  Agency Name: [facility] Address: [address], [place], [place] Phone: [phone] Website: [email or web address] Services Offered: Distributes food the 4th Saturday of the month, starting at  8:00 am  Agency Name: [facility] Address: [phone] [address] [place], [place] [address] Phone: [phone] Website: [email or web address] Service(s) Offered: Bread of life, weekly food pantry. Open Wednesdays from  10:00am-noon.  Agency Name: [facility] Address: [place], [place], [place] Phone: [phone] Services Offered: Distributes food 9am-1pm, Monday-Thursday. Call for details.  Agency Name: [facility] Address: [address], [place], [place] [address] Phone: [phone] Website: [email or web address] Service(s) Offered: Youth worker. Call for assistance.  Agency Name: [facility] Address: [address] [place], [place] [address] Phone: [phone] Service Offered: Emergency Food Pantry. Call for appointment.  Agency Name: [place] Address: [address], [place],  [address] Phone: [phone] Website: [email or web address] Services Offered: Licensed conveyancer. Call for details  Agency Name: [facility] Address: [place], [place] Phone: [phone] Website: [email or web address] Service(s) Offered: Emergency Food Pantry. Call for details.  Agency Name: [employer] Address: [place]. [address], [place],  [address] Phone: [phone] or [phone] Website: [email or web address] Service(s) Offered: Distribute food 9am-11:30 am, Tuesday-Friday, and 1- 3:30pm, Monday-Friday. Food pantry Monday-Friday  1pm-3pm, fresh items, Mon.-Wed.-Fri.  Agency Name: [facility] Address: [address] [place], [place] [address] Phone: [phone] Website: [email or web address] Services Offered: Distribute food Tues and Sats from 9:00am-noon. Closed  1st Saturday of each month. Call for details   Low Sodium Nutrition Therapy  Eating less sodium can help you if you have high blood pressure, heart failure, or kidney or liver disease.   Your body needs a little sodium, but too much sodium can cause your body to hold onto extra water. This extra water will raise your blood pressure and can cause damage to your heart, kidneys, or liver as they are forced to work harder.   Sometimes you can see how the extra fluid affects you because your hands, legs, or belly swell. You may also hold water around your heart and lungs, which makes it hard to breathe.   Even if you take medication for blood pressure or a water pill (diuretic) to remove fluid, it is still important to have less salt in your diet.   Check with your primary care provider before drinking alcohol since it may affect the amount of fluid in your body and how your heart, kidneys, or liver work. Sodium in Food A low-sodium meal plan limits the sodium that you get from food and beverages to 1,500-2,000 milligrams (mg) per day. Salt is the main source of sodium. Read the nutrition label on the package to find out how much sodium is in  one serving of a food.  Select foods with 140 milligrams (mg) of sodium or less per serving.  You may be able to eat one or two servings of foods with a little more than 140 milligrams (mg) of sodium if you are closely watching how much sodium you eat in a day.  Check the serving size on the label. The amount of sodium listed on the label shows the amount in one serving of the food. So, if you eat more than one serving, you will get more sodium than the amount listed.  Tips Cutting Back on Sodium Eat more fresh foods.  Fresh fruits and vegetables are low in sodium, as well as frozen vegetables and fruits that have no added juices or sauces.  Fresh meats are lower in sodium than processed meats, such as bacon, sausage, and hotdogs.  Not all processed foods are unhealthy, but some processed foods may have too much sodium.  Eat less salt at the table and when cooking. One of the ingredients in salt is sodium.  One teaspoon of table salt has 2,300 milligrams of sodium.  Leave the salt out of recipes for pasta, casseroles, and soups. Be a Paramedic.  Food packages that say "Salt-free", sodium-free", "very low sodium," and "low sodium" have less than 140 milligrams of sodium per serving.  Beware of products identified as "Unsalted," "No Salt Added," "Reduced Sodium," or "Lower Sodium." These items may still be high in sodium. You should always check the nutrition label. Add flavors to your food without adding sodium.  Try lemon juice, lime juice, or vinegar.  Dry or fresh herbs add flavor.  Buy a sodium-free seasoning blend or make your own at home. You can purchase salt-free or sodium-free condiments like barbeque sauce in stores and online. Ask your registered dietitian nutritionist for recommendations and where to find them.   Eating in Restaurants Choose foods carefully when you eat outside your home. Restaurant foods can be very high in sodium. Many restaurants provide nutrition facts on  their menus  or their websites. If you cannot find that information, ask your server. Let your server know that you want your food to be cooked without salt and that you would like your salad dressing and sauces to be served on the side.    Foods Recommended Food Group Foods Recommended  Grains Bread, bagels, rolls without salted tops Homemade bread made with reduced-sodium baking powder Cold cereals, especially shredded wheat and puffed rice Oats, grits, or cream of wheat Pastas, quinoa, and rice Popcorn, pretzels or crackers without salt Corn tortillas  Protein Foods Fresh meats and fish; [place] bacon (check the nutrition labels - make sure they are not packaged in a sodium solution) Canned or packed tuna (no more than 4 ounces at 1 serving) Beans and peas Soybeans) and tofu Eggs Nuts or nut butters without salt  Dairy Milk or milk powder Plant milks, such as rice and soy Yogurt, including Greek yogurt Small amounts of natural cheese (blocks of cheese) or reduced-sodium cheese can be used in moderation. (Swiss, ricotta, and fresh mozzarella cheese are lower in sodium than the others) Cream Cheese Low sodium cottage cheese  Vegetables Fresh and frozen vegetables without added sauces or salt Homemade soups (without salt) Low-sodium, salt-free or sodium-free canned vegetables and soups  Fruit Fresh and canned fruits Dried fruits, such as raisins, cranberries, and prunes  Oils Tub or liquid margarine, regular or without salt Canola, corn, peanut, olive, safflower, or sunflower oils  Condiments Fresh or dried herbs such as basil, bay leaf, dill, mustard (dry), nutmeg, paprika, parsley, rosemary, sage, or thyme.  Low sodium ketchup Vinegar  Lemon or lime juice Pepper, red pepper flakes, and cayenne. Hot sauce contains sodium, but if you use just a drop or two, it will not add up to much.  Salt-free or sodium-free seasoning mixes and marinades Simple salad dressings: vinegar and oil    Foods Not Recommended Food Group Foods Not Recommended  Grains Breads or crackers topped with salt Cereals (hot/cold) with more than 300 mg sodium per serving Biscuits, cornbread, and other "quick" breads prepared with baking soda Pre-packaged bread crumbs Seasoned and packaged rice and pasta mixes Self-rising flours  Protein Foods Cured meats: Bacon, ham, sausage, pepperoni and hot dogs Canned meats (chili, vienna sausage, or sardines) Smoked fish and meats Frozen meals that have more than 600 mg of sodium per serving Egg substitute (with added sodium)  Dairy Buttermilk Processed cheese spreads Cottage cheese (1 cup may have over 500 mg of sodium; look for low-sodium.) American or feta cheese Shredded Cheese has more sodium than blocks of cheese String cheese  Vegetables Canned vegetables (unless they are salt-free, sodium-free or low sodium) Frozen vegetables with seasoning and sauces Sauerkraut and pickled vegetables Canned or dried soups (unless they are salt-free, sodium-free, or low sodium) [place] fries and onion rings  Fruit Dried fruits preserved with additives that have sodium  Oils Salted butter or margarine, all types of olives  Condiments Salt, sea salt, kosher salt, onion salt, and garlic salt Seasoning mixes with salt Bouillon cubes Ketchup Barbeque sauce and Worcestershire sauce unless low sodium Soy sauce Salsa, pickles, olives, relish Salad dressings: ranch, blue cheese, [place], and [place].   Low Sodium Sample 1-Day Menu  Breakfast 1 cup cooked oatmeal  1 slice whole wheat bread toast  1 tablespoon peanut butter without salt  1 banana  1 cup 1% milk  Lunch Tacos made with: 2 corn tortillas   cup black beans, low sodium   cup roasted or  grilled chicken (without skin)   avocado  Squeeze of lime juice  1 cup salad greens  1 tablespoon low-sodium salad dressing   cup strawberries  1 orange  Afternoon Snack 1/3 cup grapes  6 ounces yogurt   Evening Meal 3 ounces herb-baked fish  1 baked potato  2 teaspoons olive oil   cup cooked carrots  2 thick slices tomatoes on:  2 lettuce leaves  1 teaspoon olive oil  1 teaspoon balsamic vinegar  1 cup 1% milk  Evening Snack 1 apple   cup almonds without salt   Low-Sodium Vegetarian (Lacto-Ovo) Sample 1-Day Menu  Breakfast 1 cup cooked oatmeal  1 slice whole wheat toast  1 tablespoon peanut butter without salt  1 banana  1 cup 1% milk  Lunch Tacos made with: 2 corn tortillas   cup black beans, low sodium   cup roasted or grilled chicken (without skin)   avocado  Squeeze of lime juice  1 cup salad greens  1 tablespoon low-sodium salad dressing   cup strawberries  1 orange  Evening Meal Stir fry made with:  cup tofu  1 cup brown rice   cup broccoli   cup green beans   cup peppers   tablespoon peanut oil  1 orange  1 cup 1% milk  Evening Snack 4 strips celery  2 tablespoons hummus  1 hard-boiled egg   Low-Sodium Vegan Sample 1-Day Menu  Breakfast 1 cup cooked oatmeal  1 tablespoon peanut butter without salt  1 cup blueberries  1 cup soymilk fortified with calcium, vitamin B12, and vitamin D  Lunch 1 small whole wheat pita   cup cooked lentils  2 tablespoons hummus  4 carrot sticks  1 medium apple  1 cup soymilk fortified with calcium, vitamin B12, and vitamin D  Evening Meal Stir fry made with:  cup tofu  1 cup brown rice   cup broccoli   cup green beans   cup peppers   tablespoon peanut oil  1 cup cantaloupe  Evening Snack 1 cup soy yogurt   cup mixed nuts  Copyright 2020  Academy of Nutrition and Dietetics. All rights reserved  Sodium Free Flavoring Tips  When cooking, the following items may be used for flavoring instead of salt or seasonings that contain sodium. Remember: A little bit of spice goes a long way! Be careful not to overseason. Spice Blend Recipe (makes about ? cup) 5 teaspoons onion powder  2 teaspoons garlic  powder  2 teaspoons paprika  2 teaspoon dry mustard  1 teaspoon crushed thyme leaves   teaspoon white pepper   teaspoon celery seed Food Item Flavorings  Beef Basil, bay leaf, caraway, curry, dill, dry mustard, garlic, grape jelly, green pepper, mace, marjoram, mushrooms (fresh), nutmeg, onion or onion powder, parsley, pepper, rosemary, sage  Chicken Basil, cloves, cranberries, mace, mushrooms (fresh), nutmeg, oregano, paprika, parsley, pineapple, saffron, sage, savory, tarragon, thyme, tomato, turmeric  Egg Chervil, curry, dill, dry mustard, garlic or garlic powder, green pepper, jelly, mushrooms (fresh), nutmeg, onion powder, paprika, parsley, rosemary, tarragon, tomato  Fish Basil, bay leaf, chervil, curry, dill, dry mustard, green pepper, lemon juice, marjoram, mushrooms (fresh), paprika, pepper, tarragon, tomato, turmeric  Lamb Cloves, curry, dill, garlic or garlic powder, mace, mint, mint jelly, onion, oregano, parsley, pineapple, rosemary, tarragon, thyme  Pork Applesauce, basil, caraway, chives, cloves, garlic or garlic powder, onion or onion powder, rosemary, thyme  Veal Apricots, basil, bay leaf, currant jelly, curry, ginger, marjoram, mushrooms (fresh), oregano,  paprika  Vegetables Basil, dill, garlic or garlic powder, ginger, lemon juice, mace, marjoram, nutmeg, onion or onion powder, tarragon, tomato, sugar or sugar substitute, salt-free salad dressing, vinegar  Desserts Allspice, anise, cinnamon, cloves, ginger, mace, nutmeg, vanilla extract, other extracts   Copyright 2020  Academy of Nutrition and Dietetics. All rights reserved  Fluid Restricted Nutrition Therapy  You have been prescribed this diet because your condition affects how much fluid you can eat or drink. If your heart, liver, or kidneys aren't working properly, you may not be able to effectively eliminate fluids from the body and this may cause swelling (edema) in the legs, arms, and/or stomach. Drink no more  than _________ liters or ________ ounces or ________cups of fluid per day.  You don't need to stop eating or drinking the same fluids you normally would, but you may need to eat or drink less than usual.  Your registered dietitian nutritionist will help you determine the correct amount of fluid to consume during the day Breakfast Include fluids taken with medications  Lunch Include fluids taken with medications  Dinner Include fluids taken with medications  Bedtime Snack Include fluids taken with medications     Tips What Are Fluids?  A fluid is anything that is liquid or anything that would melt if left at room temperature. You will need to count these foods and liquids--including any liquid used to take medication--as part of your daily fluid intake. Some examples are: Alcohol (drink only with your doctor's permission)  Coffee, tea, and other hot beverages  Gelatin (Jell-O)  Gravy  Ice cream, sherbet, sorbet  Ice cubes, ice chips  Milk, liquid creamer  Nutritional supplements  Popsicles  Vegetable and fruit juices; fluid in canned fruit  Watermelon  Yogurt  Soft drinks, lemonade, limeade  Soups  Syrup How Do I Measure My Fluid Intake? Record your fluid intake daily.  Tip: Every day, each time you eat or drink fluids, pour water in the same amount into an empty container that can hold the same amount of fluids you are allowed daily. This may help you keep track of how much fluid you are taking in throughout the day.  To accurately keep track of how much liquid you take in, measure the size of the cups, glasses, and bowls you use. If you eat soup, measure how much of it is liquid and how much is solid (such as noodles, vegetables, meat). Conversions for Measuring Fluid Intake  Milliliters (mL) Liters (L) Ounces (oz) Cups (c)  1000 1 32 4  1200 1.2 40 5  1500 1.5 50 6 1/4  1800 1.8 60 7 1/2  2000 2 67 8 1/3  Tips to Reduce Your Thirst Chew gum or suck on hard candy.  Rinse or  gargle with mouthwash. Do not swallow.  Ice chips or popsicles my help quench thirst, but this too needs to be calculated into the total restriction. Melt ice chips or cubes first to figure out how much fluid they produce (for example, experiment with melting  cup ice chips or 2 ice cubes).  Add a lemon wedge to your water.  Limit how much salt you take in. A high salt intake might make you thirstier.  Don't eat or drink all your allowed liquids at once. Space your liquids out through the day.  Use small glasses and cups and sip slowly. If allowed, take your medications with fluids you eat or drink during a meal.   Fluid-Restricted Nutrition Therapy Sample  1-Day Menu  Breakfast 1 slice wheat toast  1 tablespoon peanut butter  1/2 cup yogurt (120 milliliters)  1/2 cup blueberries  1 cup milk (240 milliliters)   Lunch 3 ounces sliced [place]  2 slices whole wheat bread  1/2 cup lettuce for sandwich  2 slices tomato for sandwich  1 ounce reduced-fat, reduced-sodium cheese  1/2 cup fresh carrot sticks  1 banana  1 cup unsweetened tea (240 milliliters)   Evening Meal 8 ounces soup (240 milliliters)  3 ounces salmon  1/2 cup quinoa  1 cup green beans  1 cup mixed greens salad  1 tablespoon olive oil  1 cup coffee (240 milliliters)  Evening Snack 1/2 cup sliced peaches  1/2 cup frozen yogurt (120 milliliters)  1 cup water (240 milliliters)  Copyright 2020  Academy of Nutrition and Dietetics. All rights reserved

## 2022-10-27 NOTE — Progress Notes (Deleted)
Lucilla Lame, MD [facility]   [address] [place], [place] [address] Phone: [phone] Fax : [phone]   Subjective: The patient had a good day yesterday but today says he has that patient had he has always had that started last night and has been bad all day. The patient LFT are unchanged. No chills reported.   Objective: Vital signs in last 24 hours: Vitals:   10/26/22 1936 10/26/22 1945 10/27/22 0543 10/27/22 0851  BP:  114/67 108/60 (!) 120/57  Pulse:  89 99 98  Resp:  '20 20 16  '$ Temp:  99.1 F (37.3 C) 97.7 F (36.5 C) 98.3 F (36.8 C)  TempSrc:  Oral Oral Oral  SpO2: 96% 99% 93% 92%  Weight:      Height:       Weight change:   Intake/Output Summary (Last 24 hours) at 10/27/2022 1128 Last data filed at 10/27/2022 1028 Gross per 24 hour  Intake 1637.76 ml  Output 4950 ml  Net -3312.24 ml     Exam: Heart:: Regular rate and rhythm, S1S2 present, or without murmur or extra heart sounds Lungs: normal and clear to auscultation and percussion Abdomen: soft, nontender, normal bowel sounds   Lab Results: '@LABTEST2'$ @ Micro Results: Recent Results (from the past 240 hour(s))  Blood culture (routine x 2)     Status: None (Preliminary result)   Collection Time: 10/25/22  4:24 AM   Specimen: BLOOD  Result Value Ref Range Status   Specimen Description BLOOD RIGHT FA  Final   Special Requests   Final    BOTTLES DRAWN AEROBIC AND ANAEROBIC Blood Culture adequate volume   Culture   Final    NO GROWTH 2 DAYS Performed at [facility], [address] [place], [place] [address]    Report Status PENDING  Incomplete  Resp Panel by RT-PCR (Flu A&B, Covid) Anterior Nasal Swab     Status: None   Collection Time: 10/25/22  4:24 AM   Specimen: Anterior Nasal Swab  Result Value Ref Range Status   SARS Coronavirus 2 by RT PCR NEGATIVE NEGATIVE Final    Comment: (NOTE) SARS-CoV-2 target nucleic acids are NOT DETECTED.  The SARS-CoV-2 RNA is generally  detectable in upper respiratory specimens during the acute phase of infection. The lowest concentration of SARS-CoV-2 viral copies this assay can detect is 138 copies/mL. A negative result does not preclude SARS-Cov-2 infection and should not be used as the sole basis for treatment or other patient management decisions. A negative result may occur with  improper specimen collection/handling, submission of specimen other than nasopharyngeal swab, presence of viral mutation(s) within the areas targeted by this assay, and inadequate number of viral copies(<138 copies/mL). A negative result must be combined with clinical observations, patient history, and epidemiological information. The expected result is Negative.  Fact Sheet for Patients:  EntrepreneurPulse.com.au  Fact Sheet for Healthcare Providers:  IncredibleEmployment.be  This test is no t yet approved or cleared by the [place] FDA and  has been authorized for detection and/or diagnosis of SARS-CoV-2 by FDA under an Emergency Use Authorization (EUA). This EUA will remain  in effect (meaning this test can be used) for the duration of the COVID-19 declaration under Section 564(b)(1) of the Act, 21 U.S.C.section 360bbb-3(b)(1), unless the authorization is terminated  or revoked sooner.       Influenza A by PCR NEGATIVE NEGATIVE Final   Influenza B by PCR NEGATIVE NEGATIVE Final    Comment: (NOTE) The Xpert Xpress  SARS-CoV-2/FLU/RSV plus assay is intended as an aid in the diagnosis of influenza from Nasopharyngeal swab specimens and should not be used as a sole basis for treatment. Nasal washings and aspirates are unacceptable for Xpert Xpress SARS-CoV-2/FLU/RSV testing.  Fact Sheet for Patients: EntrepreneurPulse.com.au  Fact Sheet for Healthcare Providers: IncredibleEmployment.be  This test is not yet approved or cleared by the [place] FDA  and has been authorized for detection and/or diagnosis of SARS-CoV-2 by FDA under an Emergency Use Authorization (EUA). This EUA will remain in effect (meaning this test can be used) for the duration of the COVID-19 declaration under Section 564(b)(1) of the Act, 21 U.S.C. section 360bbb-3(b)(1), unless the authorization is terminated or revoked.  Performed at [facility], [address] [place], [place] [address]   Blood culture (routine x 2)     Status: None (Preliminary result)   Collection Time: 10/25/22  4:45 AM   Specimen: BLOOD  Result Value Ref Range Status   Specimen Description BLOOD LEFT AC  Final   Special Requests   Final    BOTTLES DRAWN AEROBIC AND ANAEROBIC Blood Culture results may not be optimal due to an excessive volume of blood received in culture bottles   Culture   Final    NO GROWTH 2 DAYS Performed at [facility], [address], [place], [place] [address]    Report Status PENDING  Incomplete   Studies/Results: No results found. Medications: I have reviewed the patient's current medications. Scheduled Meds:  sodium chloride   Intravenous Once   fluticasone furoate-vilanterol  1 puff Inhalation Daily   furosemide  40 mg Intravenous Q12H   guaiFENesin  600 mg Oral BID   influenza vac split quadrivalent PF  0.5 mL Intramuscular Tomorrow-1000   ipratropium-albuterol  3 mL Nebulization BID   pantoprazole (PROTONIX) IV  40 mg Intravenous BID   umeclidinium bromide  1 puff Inhalation Daily   Continuous Infusions:  sodium chloride Stopped (10/25/22 0452)   azithromycin 250 mL/hr at 10/27/22 0441   cefTRIAXone (ROCEPHIN)  IV Stopped (10/27/22 0422)   PRN Meds:.acetaminophen **OR** acetaminophen, chlorpheniramine-HYDROcodone, magnesium hydroxide, ondansetron **OR** ondansetron (ZOFRAN) IV, traZODone   Assessment: Principal Problem:   Symptomatic anemia Active Problems:   Cognitive impairment   Sepsis due to pneumonia (HCC)    Acute diastolic CHF (congestive heart failure) ([place])   Essential hypertension   GERD without esophagitis   Dyslipidemia   Melena    Plan: The patient had a pain free day yesterday but now reports that the pain he has had for some time is back. He reports this not to be the pancreatitis pain he was having.  The patient  CBC and lipase was rechecked and the CBC continues to show a high white count with the lipase being better. The patient will have a repeat CT scan of the abdomen ordered.   LOS: 2 days   Lewayne Bunting 10/27/2022, 11:28 AM Pager [phone] 7am-5pm  Check AMION for 5pm -7am coverage and on weekends

## 2022-10-27 NOTE — Anesthesia Preprocedure Evaluation (Signed)
Anesthesia Evaluation  Patient identified by MRN, date of birth, ID band Patient awake  General Assessment Comment:  Presented to hospital with weakness, trouble breathing. Found to be anemic, s/p PRBC transfusion and diuresis. Per hospitalist notes, breathing status much improved.  At colonoscopy from 02/09/22, intra-op note: "Patient retching when leaving room, bile noted on pillow. Oropharynx suctioned prior to leaving procedure room."   Reviewed: Allergy & Precautions, NPO status , Patient's Chart, lab work & pertinent test results  History of Anesthesia Complications Negative for: history of anesthetic complications  Airway Mallampati: III  TM Distance: >3 FB Neck ROM: full    Dental  (+) Chipped, Edentulous Upper, Edentulous Lower   Pulmonary neg pulmonary ROS, COPD,  COPD inhaler, former smoker    + decreased breath sounds+ wheezing      Cardiovascular Exercise Tolerance: Poor hypertension, + CAD, + Past MI, + Peripheral Vascular Disease and +CHF  negative cardio ROS Normal cardiovascular exam+ Valvular Problems/Murmurs AS  Rhythm:Regular Rate:Normal  TTE 10/17/22: 1. Left ventricular ejection fraction, by estimation, is 60 to 65%. The  left ventricle has normal function. Left ventricular endocardial border  not optimally defined to evaluate regional wall motion. Left ventricular  diastolic parameters are consistent  with Grade II diastolic dysfunction (pseudonormalization). Elevated left  atrial pressure.   2. Right ventricular systolic function is mildly reduced. The right  ventricular size is mildly enlarged.   3. The mitral valve was not well visualized. No evidence of mitral valve  regurgitation. Mild mitral stenosis. Moderate to severe mitral annular  calcification.   4. The aortic valve was not well visualized. There is severe calcifcation  of the aortic valve. There is severe thickening of the aortic valve.   Aortic valve regurgitation is not visualized. Moderate aortic valve  stenosis. Aortic valve mean gradient  measures 28.0 mmHg. Aortic valve Vmax measures 3.59 m/s.   5. The inferior vena cava is dilated in size with <50% respiratory  variability, suggesting right atrial pressure of 15 mmHg.   6. Agitated saline contrast bubble study was negative, with no evidence  of any interatrial shunt.     Neuro/Psych CVA negative neurological ROS  negative psych ROS   GI/Hepatic negative GI ROS, Neg liver ROS,GERD  ,,  Endo/Other  negative endocrine ROS    Renal/GU Renal disease (AKI)negative Renal ROS  negative genitourinary   Musculoskeletal   Abdominal   Peds  Hematology negative hematology ROS (+) Blood dyscrasia, anemia   Anesthesia Other Findings Past Medical History: No date: COPD (chronic obstructive pulmonary disease) (HCC) No date: CVA (cerebral infarction) No date: Headache     Comment:  mild, since stroke 2012 No date: Hypertension 05/09/2012: MI (myocardial infarction) ([place]) No date: Reading difficulty     Comment:  pt reports he reads at "about a second grade level" 2012: Stroke [facility])     Comment:  numbness to left hand  No date: Wears dentures     Comment:  full upper and lower  Past Surgical History: No date: CAROTID ENDARTERECTOMY; Left     Comment:  2012 or 2013 07/26/2016: CHOLECYSTECTOMY; N/A     Comment:  Procedure: LAPAROSCOPIC CHOLECYSTECTOMY;  Surgeon:               Florene Glen, MD;  Location: [facility] ORS;  Service:               General;  Laterality: N/A; 02/09/2022: COLONOSCOPY WITH PROPOFOL; N/A     Comment:  Procedure: COLONOSCOPY  WITH PROPOFOL;  Surgeon: Lucilla Lame, MD;  Location: [facility] ENDOSCOPY;  Service:               Endoscopy;  Laterality: N/A; 05/10/2012: CORONARY ARTERY BYPASS GRAFT     Comment:  3 vessel  BMI    Body Mass Index: 34.50 kg/m      Reproductive/Obstetrics negative OB ROS                              Anesthesia Physical Anesthesia Plan  ASA: 4  Anesthesia Plan: General   Post-op Pain Management: Minimal or no pain anticipated   Induction: Intravenous  PONV Risk Score and Plan: Propofol infusion and TIVA  Airway Management Planned: Natural Airway and Nasal Cannula  Additional Equipment:   Intra-op Plan:   Post-operative Plan:   Informed Consent: I have reviewed the patients History and Physical, chart, labs and discussed the procedure including the risks, benefits and alternatives for the proposed anesthesia with the patient or authorized representative who has indicated his/her understanding and acceptance.     Dental Advisory Given  Plan Discussed with: Anesthesiologist, CRNA and Surgeon  Anesthesia Plan Comments: (Patient consented for risks of anesthesia including but not limited to:  - adverse reactions to medications - risk of airway placement if required - damage to eyes, teeth, lips or other oral mucosa - nerve damage due to positioning  - sore throat or hoarseness - Damage to heart, brain, nerves, lungs, other parts of body or loss of life  Patient voiced understanding.)        Anesthesia Quick Evaluation

## 2022-10-27 NOTE — Progress Notes (Signed)
Douglas Lame, MD [facility]   [address] [place], [place] [address] Phone: [phone] Fax : [phone]   Subjective: The patient denies any sign of bleeding overnight. He has a slight drop in his Hb. No abdominal pain reported.    Objective: Vital signs in last 24 hours: Vitals:   10/26/22 1936 10/26/22 1945 10/27/22 0543 10/27/22 0851  BP:  114/67 108/60 (!) 120/57  Pulse:  89 99 98  Resp:  '20 20 16  '$ Temp:  99.1 F (37.3 C) 97.7 F (36.5 C) 98.3 F (36.8 C)  TempSrc:  Oral Oral Oral  SpO2: 96% 99% 93% 92%  Weight:      Height:       Weight change:   Intake/Output Summary (Last 24 hours) at 10/27/2022 1139 Last data filed at 10/27/2022 1028 Gross per 24 hour  Intake 1637.76 ml  Output 4950 ml  Net -3312.24 ml     Exam: Heart:: Regular rate and rhythm, S1S2 present, or without murmur or extra heart sounds Lungs: normal and clear to auscultation and percussion Abdomen: soft, nontender, normal bowel sounds   Lab Results: '@LABTEST2'$ @ Micro Results: Recent Results (from the past 240 hour(s))  Blood culture (routine x 2)     Status: None (Preliminary result)   Collection Time: 10/25/22  4:24 AM   Specimen: BLOOD  Result Value Ref Range Status   Specimen Description BLOOD RIGHT FA  Final   Special Requests   Final    BOTTLES DRAWN AEROBIC AND ANAEROBIC Blood Culture adequate volume   Culture   Final    NO GROWTH 2 DAYS Performed at [facility], [address] [place], [place] [address]    Report Status PENDING  Incomplete  Resp Panel by RT-PCR (Flu A&B, Covid) Anterior Nasal Swab     Status: None   Collection Time: 10/25/22  4:24 AM   Specimen: Anterior Nasal Swab  Result Value Ref Range Status   SARS Coronavirus 2 by RT PCR NEGATIVE NEGATIVE Final    Comment: (NOTE) SARS-CoV-2 target nucleic acids are NOT DETECTED.  The SARS-CoV-2 RNA is generally detectable in upper respiratory specimens during the acute phase of infection. The  lowest concentration of SARS-CoV-2 viral copies this assay can detect is 138 copies/mL. A negative result does not preclude SARS-Cov-2 infection and should not be used as the sole basis for treatment or other patient management decisions. A negative result may occur with  improper specimen collection/handling, submission of specimen other than nasopharyngeal swab, presence of viral mutation(s) within the areas targeted by this assay, and inadequate number of viral copies(<138 copies/mL). A negative result must be combined with clinical observations, patient history, and epidemiological information. The expected result is Negative.  Fact Sheet for Patients:  EntrepreneurPulse.com.au  Fact Sheet for Healthcare Providers:  IncredibleEmployment.be  This test is no t yet approved or cleared by the [place] FDA and  has been authorized for detection and/or diagnosis of SARS-CoV-2 by FDA under an Emergency Use Authorization (EUA). This EUA will remain  in effect (meaning this test can be used) for the duration of the COVID-19 declaration under Section 564(b)(1) of the Act, 21 U.S.C.section 360bbb-3(b)(1), unless the authorization is terminated  or revoked sooner.       Influenza A by PCR NEGATIVE NEGATIVE Final   Influenza B by PCR NEGATIVE NEGATIVE Final    Comment: (NOTE) The Xpert Xpress SARS-CoV-2/FLU/RSV plus assay is intended as an aid in the diagnosis of influenza from Nasopharyngeal swab  specimens and should not be used as a sole basis for treatment. Nasal washings and aspirates are unacceptable for Xpert Xpress SARS-CoV-2/FLU/RSV testing.  Fact Sheet for Patients: EntrepreneurPulse.com.au  Fact Sheet for Healthcare Providers: IncredibleEmployment.be  This test is not yet approved or cleared by the [place] FDA and has been authorized for detection and/or diagnosis of SARS-CoV-2 by FDA under  an Emergency Use Authorization (EUA). This EUA will remain in effect (meaning this test can be used) for the duration of the COVID-19 declaration under Section 564(b)(1) of the Act, 21 U.S.C. section 360bbb-3(b)(1), unless the authorization is terminated or revoked.  Performed at [facility], [address] [place], [place] [address]   Blood culture (routine x 2)     Status: None (Preliminary result)   Collection Time: 10/25/22  4:45 AM   Specimen: BLOOD  Result Value Ref Range Status   Specimen Description BLOOD LEFT AC  Final   Special Requests   Final    BOTTLES DRAWN AEROBIC AND ANAEROBIC Blood Culture results may not be optimal due to an excessive volume of blood received in culture bottles   Culture   Final    NO GROWTH 2 DAYS Performed at [facility], [address] [place],  [address]    Report Status PENDING  Incomplete   Studies/Results: No results found. Medications: I have reviewed the patient's current medications. Scheduled Meds:  sodium chloride   Intravenous Once   fluticasone furoate-vilanterol  1 puff Inhalation Daily   furosemide  40 mg Intravenous Q12H   guaiFENesin  600 mg Oral BID   influenza vac split quadrivalent PF  0.5 mL Intramuscular Tomorrow-1000   ipratropium-albuterol  3 mL Nebulization BID   pantoprazole (PROTONIX) IV  40 mg Intravenous BID   umeclidinium bromide  1 puff Inhalation Daily   Continuous Infusions:  sodium chloride Stopped (10/25/22 0452)   azithromycin 250 mL/hr at 10/27/22 0441   cefTRIAXone (ROCEPHIN)  IV Stopped (10/27/22 0422)   PRN Meds:.acetaminophen **OR** acetaminophen, chlorpheniramine-HYDROcodone, magnesium hydroxide, ondansetron **OR** ondansetron (ZOFRAN) IV, traZODone   Assessment: Principal Problem:   Symptomatic anemia Active Problems:   Cognitive impairment   Sepsis due to pneumonia (HCC)   Acute diastolic CHF (congestive heart failure) ([place])   Essential hypertension   GERD  without esophagitis   Dyslipidemia   Melena    Plan: The patient is doing better today but has continued low Hb. The patient had a colonoscopy earlier this year and therefore will be set up for an EGD for tomorrow. The patient has been explained the plan and agrees with it.   LOS: 2 days   Lewayne Bunting 10/27/2022, 11:39 AM Pager [phone] 7am-5pm  Check AMION for 5pm -7am coverage and on weekends

## 2022-10-28 ENCOUNTER — Encounter: Payer: Self-pay | Admitting: Family Medicine

## 2022-10-28 ENCOUNTER — Inpatient Hospital Stay: Payer: Medicare Other | Admitting: Certified Registered Nurse Anesthetist

## 2022-10-28 ENCOUNTER — Encounter
Admission: EM | Disposition: A | Discharge status: Home / Self Care | Payer: Self-pay | Source: Home / Self Care | Attending: Internal Medicine

## 2022-10-28 DIAGNOSIS — I5031 Acute diastolic (congestive) heart failure: Secondary | ICD-10-CM | POA: Diagnosis not present

## 2022-10-28 DIAGNOSIS — D649 Anemia, unspecified: Secondary | ICD-10-CM | POA: Diagnosis not present

## 2022-10-28 DIAGNOSIS — J441 Chronic obstructive pulmonary disease with (acute) exacerbation: Secondary | ICD-10-CM

## 2022-10-28 DIAGNOSIS — J189 Pneumonia, unspecified organism: Secondary | ICD-10-CM | POA: Diagnosis not present

## 2022-10-28 HISTORY — PX: ESOPHAGOGASTRODUODENOSCOPY (EGD) WITH PROPOFOL: SHX5813

## 2022-10-28 LAB — BASIC METABOLIC PANEL
Anion gap: 8 (ref 5–15)
BUN: 5 mg/dL — ABNORMAL LOW (ref 8–23)
CO2: 30 mmol/L (ref 22–32)
Calcium: 8.6 mg/dL — ABNORMAL LOW (ref 8.9–10.3)
Chloride: 99 mmol/L (ref 98–111)
Creatinine, Ser: 0.95 mg/dL (ref 0.61–1.24)
GFR, Estimated: >60 mL/min (ref >60)
Glucose, Bld: 94 mg/dL (ref 70–99)
Potassium: 3.4 mmol/L — ABNORMAL LOW (ref 3.5–5.1)
Sodium: 137 mmol/L (ref 135–145)

## 2022-10-28 LAB — CBC WITH DIFFERENTIAL/PLATELET
Abs Immature Granulocytes: 0.1 10*3/uL — ABNORMAL HIGH (ref 0.00–0.07)
Basophils Absolute: 0.2 10*3/uL — ABNORMAL HIGH (ref 0.0–0.1)
Basophils Relative: 2 %
Eosinophils Absolute: 0.5 10*3/uL (ref 0.0–0.5)
Eosinophils Relative: 4 %
HCT: 28.6 % — ABNORMAL LOW (ref 39.0–52.0)
Hemoglobin: 8.2 g/dL — ABNORMAL LOW (ref 13.0–17.0)
Immature Granulocytes: 1 %
Lymphocytes Relative: 19 %
Lymphs Abs: 2.2 10*3/uL (ref 0.7–4.0)
MCH: 21.2 pg — ABNORMAL LOW (ref 26.0–34.0)
MCHC: 28.7 g/dL — ABNORMAL LOW (ref 30.0–36.0)
MCV: 74.1 fL — ABNORMAL LOW (ref 80.0–100.0)
Monocytes Absolute: 1.6 10*3/uL — ABNORMAL HIGH (ref 0.1–1.0)
Monocytes Relative: 13 %
Neutro Abs: 7.5 10*3/uL (ref 1.7–7.7)
Neutrophils Relative %: 61 %
Platelets: 419 10*3/uL — ABNORMAL HIGH (ref 150–400)
RBC: 3.86 MIL/uL — ABNORMAL LOW (ref 4.22–5.81)
RDW: 19.8 % — ABNORMAL HIGH (ref 11.5–15.5)
WBC: 12.1 10*3/uL — ABNORMAL HIGH (ref 4.0–10.5)
nRBC: 0 % (ref 0.0–0.2)

## 2022-10-28 LAB — GLUCOSE, CAPILLARY
Glucose-Capillary: 170 mg/dL — ABNORMAL HIGH (ref 70–99)
Glucose-Capillary: 128 mg/dL — ABNORMAL HIGH (ref 70–99)

## 2022-10-28 LAB — HEMOGLOBIN A1C
Hgb A1c MFr Bld: 5.3 % (ref 4.8–5.6)
Mean Plasma Glucose: 105.41 mg/dL

## 2022-10-28 LAB — PROCALCITONIN: Procalcitonin: <0.1 ng/mL

## 2022-10-28 LAB — MAGNESIUM: Magnesium: 2 mg/dL (ref 1.7–2.4)

## 2022-10-28 SURGERY — ESOPHAGOGASTRODUODENOSCOPY (EGD) WITH PROPOFOL
Anesthesia: General

## 2022-10-28 MED ORDER — POTASSIUM CHLORIDE CRYS ER 20 MEQ PO TBCR
40.0000 meq | EXTENDED_RELEASE_TABLET | Freq: Once | ORAL | Status: AC
  Administered 2022-10-28: 40 meq via ORAL
  Filled 2022-10-28: qty 2

## 2022-10-28 MED ORDER — INSULIN ASPART 100 UNIT/ML IJ SOLN
0.0000 [IU] | Freq: Every day | INTRAMUSCULAR | Status: DC
  Administered 2022-10-29: 2 [IU] via SUBCUTANEOUS
  Filled 2022-10-28: qty 1

## 2022-10-28 MED ORDER — PROPOFOL 1000 MG/100ML IV EMUL
INTRAVENOUS | Status: AC
  Filled 2022-10-28: qty 100

## 2022-10-28 MED ORDER — PANTOPRAZOLE SODIUM 40 MG PO TBEC
40.0000 mg | DELAYED_RELEASE_TABLET | Freq: Every day | ORAL | Status: DC
  Administered 2022-10-28 – 2022-10-30 (×3): 40 mg via ORAL
  Filled 2022-10-28 (×3): qty 1

## 2022-10-28 MED ORDER — PROPOFOL 10 MG/ML IV BOLUS
INTRAVENOUS | Status: DC | PRN
  Administered 2022-10-28: 60 mg via INTRAVENOUS

## 2022-10-28 MED ORDER — INSULIN ASPART 100 UNIT/ML IJ SOLN
0.0000 [IU] | Freq: Three times a day (TID) | INTRAMUSCULAR | Status: DC
  Administered 2022-10-28: 3 [IU] via SUBCUTANEOUS
  Administered 2022-10-29: 5 [IU] via SUBCUTANEOUS
  Administered 2022-10-29: 3 [IU] via SUBCUTANEOUS
  Administered 2022-10-30: 11 [IU] via SUBCUTANEOUS
  Administered 2022-10-30: 3 [IU] via SUBCUTANEOUS
  Filled 2022-10-28 (×5): qty 1

## 2022-10-28 MED ORDER — PROPOFOL 500 MG/50ML IV EMUL
INTRAVENOUS | Status: DC | PRN
  Administered 2022-10-28: 120 ug/kg/min via INTRAVENOUS

## 2022-10-28 MED ORDER — IPRATROPIUM-ALBUTEROL 0.5-2.5 (3) MG/3ML IN SOLN: 3.0000 mL | Freq: Four times a day (QID) | RESPIRATORY_TRACT | Status: DC | PRN

## 2022-10-28 MED ORDER — LIDOCAINE HCL (CARDIAC) PF 100 MG/5ML IV SOSY
PREFILLED_SYRINGE | INTRAVENOUS | Status: DC | PRN
  Administered 2022-10-28: 100 mg via INTRAVENOUS

## 2022-10-28 MED ORDER — SODIUM CHLORIDE 0.9 % IV SOLN: INTRAVENOUS | Status: DC

## 2022-10-28 MED ORDER — BUDESONIDE 0.5 MG/2ML IN SUSP
0.5000 mg | Freq: Two times a day (BID) | RESPIRATORY_TRACT | Status: DC
  Administered 2022-10-28 – 2022-10-30 (×5): 0.5 mg via RESPIRATORY_TRACT
  Filled 2022-10-28 (×5): qty 2

## 2022-10-28 MED ORDER — METHYLPREDNISOLONE SODIUM SUCC 125 MG IJ SOLR
60.0000 mg | Freq: Two times a day (BID) | INTRAMUSCULAR | Status: DC
  Administered 2022-10-28 – 2022-10-30 (×4): 60 mg via INTRAVENOUS
  Filled 2022-10-28 (×4): qty 2

## 2022-10-28 NOTE — Transfer of Care (Signed)
Immediate Anesthesia Transfer of Care Note  Patient: [name]  Procedure(s) Performed: ESOPHAGOGASTRODUODENOSCOPY (EGD) WITH PROPOFOL  Patient Location: Endoscopy Unit  Anesthesia Type:General  Level of Consciousness: drowsy  Airway & Oxygen Therapy: Patient Spontanous Breathing and Patient connected to nasal cannula oxygen  Post-op Assessment: Report given to RN  Post vital signs: stable  Last Vitals:  Vitals Value Taken Time  BP    Temp    Pulse    Resp    SpO2      Last Pain:  Vitals:   10/28/22 0845  TempSrc: Temporal  PainSc: 0-No pain      Patients Stated Pain Goal: 1 (74/12/87 8676)  Complications: No notable events documented.

## 2022-10-28 NOTE — Assessment & Plan Note (Signed)
-   noted to have more wheezing post EGD; improved after adding systemic steroids - continue budesonide, duonebs, abx, breo ellipta, and incruse ellipta -Home regimen resumed at discharge

## 2022-10-28 NOTE — Op Note (Signed)
[facility] Gastroenterology Patient Name: [name] Procedure Date: 10/28/2022 8:51 AM MRN: [id number] Account #: [id number] Date of Birth: [birth year] Admit Type: Inpatient Age: 61 Room: [facility] ENDO ROOM 4 Gender: Male Note Status: [place] Name: Upper Endoscope [id number] Procedure:             Upper GI endoscopy Indications:           Acute post hemorrhagic anemia Providers:             Rueben Bash, DO Referring MD:          [facility] (Referring MD) Medicines:             Monitored Anesthesia Care Complications:         No immediate complications. Estimated blood loss:                         Minimal. Procedure:             Pre-Anesthesia Assessment:                        - Prior to the procedure, a History and Physical was                         performed, and patient medications and allergies were                         reviewed. The patient is competent. The risks and                         benefits of the procedure and the sedation options and                         risks were discussed with the patient. All questions                         were answered and informed consent was obtained.                         Patient identification and proposed procedure were                         verified by the physician, the nurse, the anesthetist                         and the technician in the endoscopy suite. Mental                         Status Examination: alert and oriented. Airway                         Examination: normal oropharyngeal airway and neck                         mobility. Respiratory Examination: expiratory wheezes.                         CV Examination: systolic murmur. Prophylactic  Antibiotics: The patient does not require prophylactic                         antibiotics. Prior Anticoagulants: The patient has                         taken no anticoagulant or  antiplatelet agents. ASA                         Grade Assessment: III - A patient with severe systemic                         disease. After reviewing the risks and benefits, the                         patient was deemed in satisfactory condition to                         undergo the procedure. The anesthesia plan was to use                         monitored anesthesia care (MAC). Immediately prior to                         administration of medications, the patient was                         re-assessed for adequacy to receive sedatives. The                         heart rate, respiratory rate, oxygen saturations,                         blood pressure, adequacy of pulmonary ventilation, and                         response to care were monitored throughout the                         procedure. The physical status of the patient was                         re-assessed after the procedure.                        After obtaining informed consent, the endoscope was                         passed under direct vision. Throughout the procedure,                         the patient's blood pressure, pulse, and oxygen                         saturations were monitored continuously. The                         Endosonoscope was introduced through the mouth, and  advanced to the second part of duodenum. The upper GI                         endoscopy was accomplished without difficulty. The                         patient tolerated the procedure well. Findings:      The duodenal bulb, first portion of the duodenum and second portion of       the duodenum were normal. Estimated blood loss: none.      Diffuse atrophic mucosa was found in the entire examined stomach.       Biopsies were taken with a cold forceps for histology. Estimated blood       loss was minimal.      A small hiatal hernia was present. Estimated blood loss: none.      The exam of the stomach was  otherwise normal.      Esophagogastric landmarks were identified: the gastroesophageal junction       was found at 40 cm from the incisors.      The Z-line was variable and was found ; 1 cm tongue of salmon colored       mucosa extending above z line. Biopsies were taken with a cold forceps       for histology. Estimated blood loss was minimal.      The exam of the esophagus was otherwise normal. Impression:            - Normal duodenal bulb, first portion of the duodenum                         and second portion of the duodenum.                        - Gastric mucosal atrophy. Biopsied.                        - Small hiatal hernia.                        - Esophagogastric landmarks identified.                        - Z-line variable, ; 1 cm tongue of salmon colored                         mucosa extending above z line. Biopsied. Recommendation:        - Patient has a contact number available for                         emergencies. The signs and symptoms of potential                         delayed complications were discussed with the patient.                         Return to normal activities tomorrow. Written                         discharge instructions were provided to the patient.                        -  Return patient to hospital ward for ongoing care.                        - Full liquid diet.                        - Continue present medications.                        - can discontinue proton pump inhibitor unless a home                         medication.                        - No aspirin, ibuprofen, naproxen, or other                         non-steroidal anti-inflammatory drugs.                        - Await pathology results.                        - Given no bleeding source found on EGD, will plan for                         colonoscopy +/-VCE pending findings. Patient's                         respiratory status continues to be tenuous with                          audible wheezing prior to procedure (no supplemental                         o2), desaturation to ~90% spo2 during procedure                         despite supplemental o2.                        Would allow for additional time for patient to                         defervesce prior to colonoscopy/repeat sedation.                        Will continue to follow.                        - The findings and recommendations were discussed with                         the patient.                        - The findings and recommendations were discussed with                         the referring physician. Procedure Code(s):     --- Professional ---  85694, Esophagogastroduodenoscopy, flexible,                         transoral; with biopsy, single or multiple Diagnosis Code(s):     --- Professional ---                        K31.89, Other diseases of stomach and duodenum                        K44.9, Diaphragmatic hernia without obstruction or                         gangrene                        K22.89, Other specified disease of esophagus                        D62, Acute posthemorrhagic anemia CPT copyright 2022 [employer]. All rights reserved. The codes documented in this report are preliminary and upon coder review may  be revised to meet current compliance requirements. Attending Participation:      I personally performed the entire procedure. Volney American, DO Annamaria Helling DO, DO 10/28/2022 9:20:36 AM This report has been signed electronically. Number of Addenda: 0 Note Initiated On: 10/28/2022 8:51 AM Estimated Blood Loss:  Estimated blood loss was minimal.      [facility]

## 2022-10-28 NOTE — Progress Notes (Signed)
Inpatient Follow-up/Progress Note   Patient ID: [name] is a 61 y.o. male.  Overnight Events / Subjective Findings NAEON. NPO since midnight for EGD today. Hgb stabilized at 8.2. VSS Reports BM dark yesterday. No hematochezia, n/v/abdominal pain.  Review of Systems  Constitutional:  Negative for activity change, appetite change, chills, diaphoresis, fatigue, fever and unexpected weight change.  HENT:  Negative for trouble swallowing and voice change.   Respiratory:  Negative for shortness of breath and wheezing.   Cardiovascular:  Negative for chest pain, palpitations and leg swelling.  Gastrointestinal:  Negative for abdominal distention, abdominal pain, anal bleeding, blood in stool, constipation, diarrhea, nausea and vomiting.  Musculoskeletal:  Negative for arthralgias and myalgias.  Skin:  Negative for color change and pallor.  Neurological:  Negative for dizziness, syncope and weakness.  Psychiatric/Behavioral:  Negative for confusion. The patient is not nervous/anxious.   All other systems reviewed and are negative.    Medications  Current Facility-Administered Medications:    0.9 %  sodium chloride infusion (Manually program via Guardrails IV Fluids), , Intravenous, Once, Dwyane Dee, MD   0.9 %  sodium chloride infusion, 10 mL/hr, Intravenous, Once, Ward, Delice Bison, DO, Held at 10/25/22 0865   acetaminophen (TYLENOL) tablet 650 mg, 650 mg, Oral, Q6H PRN, 650 mg at 10/27/22 7846 **OR** acetaminophen (TYLENOL) suppository 650 mg, 650 mg, Rectal, Q6H PRN, Mansy, Jan A, MD   azithromycin (ZITHROMAX) 500 mg in sodium chloride 0.9 % 250 mL IVPB, 500 mg, Intravenous, Q24H, Mansy, Jan A, MD, Last Rate: 250 mL/hr at 10/28/22 0554, 500 mg at 10/28/22 0554   cefTRIAXone (ROCEPHIN) 2 g in sodium chloride 0.9 % 100 mL IVPB, 2 g, Intravenous, Q24H, Mansy, Jan A, MD, Last Rate: 200 mL/hr at 10/28/22 0507, 2 g at 10/28/22 0507   chlorpheniramine-HYDROcodone (TUSSIONEX) 10-8 MG/5ML  suspension 5 mL, 5 mL, Oral, Q12H PRN, Mansy, Jan A, MD   donepezil (ARICEPT) tablet 10 mg, 10 mg, Oral, QHS, Dallie Piles, RPH, 10 mg at 10/27/22 2214   fluticasone furoate-vilanterol (BREO ELLIPTA) 100-25 MCG/ACT 1 puff, 1 puff, Inhalation, Daily, Beers, Shanon Brow, RPH, 1 puff at 10/27/22 0937   furosemide (LASIX) injection 40 mg, 40 mg, Intravenous, Q12H, Dwyane Dee, MD, 40 mg at 10/28/22 0657   guaiFENesin (MUCINEX) 12 hr tablet 600 mg, 600 mg, Oral, BID, Mansy, Jan A, MD, 600 mg at 10/27/22 2214   influenza vac split quadrivalent PF (FLUARIX) injection 0.5 mL, 0.5 mL, Intramuscular, Tomorrow-1000, Girguis, David, MD   ipratropium-albuterol (DUONEB) 0.5-2.5 (3) MG/3ML nebulizer solution 3 mL, 3 mL, Nebulization, BID, Dwyane Dee, MD, 3 mL at 10/27/22 2056   magnesium hydroxide (MILK OF MAGNESIA) suspension 30 mL, 30 mL, Oral, Daily PRN, Mansy, Jan A, MD   ondansetron (ZOFRAN) tablet 4 mg, 4 mg, Oral, Q6H PRN **OR** ondansetron (ZOFRAN) injection 4 mg, 4 mg, Intravenous, Q6H PRN, Mansy, Jan A, MD   pantoprazole (PROTONIX) injection 40 mg, 40 mg, Intravenous, BID, Dwyane Dee, MD, 40 mg at 10/27/22 2214   traZODone (DESYREL) tablet 25 mg, 25 mg, Oral, QHS PRN, Mansy, Jan A, MD, 25 mg at 10/26/22 2033   umeclidinium bromide (INCRUSE ELLIPTA) 62.5 MCG/ACT 1 puff, 1 puff, Inhalation, Daily, Beers, Shanon Brow, RPH, 1 puff at 10/27/22 0939  sodium chloride Stopped (10/25/22 0452)   azithromycin 500 mg (10/28/22 0554)   cefTRIAXone (ROCEPHIN)  IV 2 g (10/28/22 0507)    acetaminophen **OR** acetaminophen, chlorpheniramine-HYDROcodone, magnesium hydroxide, ondansetron **OR** ondansetron (ZOFRAN) IV, traZODone  Objective    Vitals:   10/27/22 1556 10/27/22 1910 10/27/22 2056 10/28/22 0516  BP: 128/75 138/79  (!) 141/84  Pulse: 77 96  (!) 101  Resp: '16 17  17  '$ Temp: 98 F (36.7 C) 97.9 F (36.6 C)  98.3 F (36.8 C)  TempSrc: Oral     SpO2: (!) 88% 96% 96% 95%  Weight:    115.4 kg   Height:         Physical Exam Vitals and nursing note reviewed.  Constitutional:      General: He is not in acute distress.    Appearance: He is obese. He is not ill-appearing, toxic-appearing or diaphoretic.  HENT:     Head: Normocephalic and atraumatic.     Nose: Nose normal.     Mouth/Throat:     Mouth: Mucous membranes are moist.     Pharynx: Oropharynx is clear.  Eyes:     General: No scleral icterus.    Extraocular Movements: Extraocular movements intact.  Cardiovascular:     Rate and Rhythm: Normal rate and regular rhythm.     Heart sounds: Murmur heard.     No friction rub. No gallop.  Pulmonary:     Effort: Pulmonary effort is normal. No respiratory distress.     Breath sounds: Wheezing present. No rhonchi or rales.  Abdominal:     General: Bowel sounds are normal. There is distension.     Palpations: Abdomen is soft.     Tenderness: There is no abdominal tenderness. There is no guarding or rebound.  Musculoskeletal:     Cervical back: Neck supple.     Right lower leg: Edema present.     Left lower leg: Edema present.  Skin:    General: Skin is warm and dry.     Coloration: Skin is not jaundiced or pale.  Neurological:     General: No focal deficit present.     Mental Status: He is alert and oriented to person, place, and time. Mental status is at baseline.  Psychiatric:        Mood and Affect: Mood normal.        Behavior: Behavior normal.        Thought Content: Thought content normal.        Judgment: Judgment normal.      Laboratory Data Recent Labs  Lab 10/26/22 0521 10/26/22 1605 10/27/22 0403 10/28/22 0440  WBC 13.5*  --  13.7* 12.1*  HGB 7.0* 8.3* 7.9* 8.2*  HCT 23.8* 27.3* 26.6* 28.6*  PLT 380  --  404* 419*  NEUTOPHILPCT 66  --  63 61  LYMPHOPCT 16  --  18 19  MONOPCT 13  --  14 13  EOSPCT 3  --  3 4   Recent Labs  Lab 10/26/22 0521 10/27/22 0403 10/28/22 0440  NA 135 138 137  K 3.1* 3.2* 3.4*  CL 98 101 99  CO2 '28 30 30   '$ BUN 12 8 5*  CREATININE 0.93 0.91 0.95  CALCIUM 8.2* 8.4* 8.6*  GLUCOSE 114* 91 94   Recent Labs  Lab 10/25/22 0424  INR 1.3*      Imaging Studies: No results found.  Assessment:   # Symptomatic anemia with melena with iron deficiency - p/w hgb 6.3 - base 8-9 - recent colonoscopy 01/2022  # GERD  #cognitive impairment #HFpEF # CAD s/p cabg # h/o CVA # COPD  # Sepsis 2/2 pna- resolving  # EtOH and Tobacco dependence  Plan:  Plan for EGD today Npo since midnight Labs reviewed- hgb stabilized at 8.2; remains hemodynamically stable Continue iron infusions Continue Protonix 40 mg iv q12 h Hold dvt ppx Monitor H&H.  Transfusion and resuscitation as per primary team Avoid frequent lab draws to prevent lab induced anemia Supportive care and antiemetics as per primary team Maintain two sites IV access Avoid nsaids Monitor for GIB.  Stool occult blood test has no role in evaluation of anemia and is a test for colon cancer screening and is inappropriate to be used for evaluation of anemia/gastrointestinal bleeding, as a negative or positive test would not change evaluation.  Further recommendations pending endoscopy. Please see op report for further details  Esophagogastroduodenoscopy with possible biopsy, control of bleeding, polypectomy, and interventions as necessary has been discussed with the patient/patient representative. Informed consent was obtained from the patient/patient representative after explaining the indication, nature, and risks of the procedure including but not limited to death, bleeding, perforation, missed neoplasm/lesions, cardiorespiratory compromise, and reaction to medications. Opportunity for questions was given and appropriate answers were provided. Patient/patient representative has verbalized understanding is amenable to undergoing the procedure.  I personally performed the service.  Management of other medical comorbidities as per primary  team  Thank you for allowing [place] to participate in this patient's care. Please don't hesitate to call if any questions or concerns arise.   Annamaria Helling, DO [facility] Gastroenterology  Portions of the record may have been created with voice recognition software. Occasional wrong-word or 'sound-a-like' substitutions may have occurred due to the inherent limitations of voice recognition software.  Read the chart carefully and recognize, using context, where substitutions may have occurred.

## 2022-10-28 NOTE — H&P (View-Only) (Signed)
Inpatient Follow-up/Progress Note   Patient ID: [name] is a 61 y.o. male.  Overnight Events / Subjective Findings NAEON. NPO since midnight for EGD today. Hgb stabilized at 8.2. VSS Reports BM dark yesterday. No hematochezia, n/v/abdominal pain.  Review of Systems  Constitutional:  Negative for activity change, appetite change, chills, diaphoresis, fatigue, fever and unexpected weight change.  HENT:  Negative for trouble swallowing and voice change.   Respiratory:  Negative for shortness of breath and wheezing.   Cardiovascular:  Negative for chest pain, palpitations and leg swelling.  Gastrointestinal:  Negative for abdominal distention, abdominal pain, anal bleeding, blood in stool, constipation, diarrhea, nausea and vomiting.  Musculoskeletal:  Negative for arthralgias and myalgias.  Skin:  Negative for color change and pallor.  Neurological:  Negative for dizziness, syncope and weakness.  Psychiatric/Behavioral:  Negative for confusion. The patient is not nervous/anxious.   All other systems reviewed and are negative.    Medications  Current Facility-Administered Medications:    0.9 %  sodium chloride infusion (Manually program via Guardrails IV Fluids), , Intravenous, Once, Dwyane Dee, MD   0.9 %  sodium chloride infusion, 10 mL/hr, Intravenous, Once, Ward, Delice Bison, DO, Held at 10/25/22 6378   acetaminophen (TYLENOL) tablet 650 mg, 650 mg, Oral, Q6H PRN, 650 mg at 10/27/22 5885 **OR** acetaminophen (TYLENOL) suppository 650 mg, 650 mg, Rectal, Q6H PRN, Mansy, Jan A, MD   azithromycin (ZITHROMAX) 500 mg in sodium chloride 0.9 % 250 mL IVPB, 500 mg, Intravenous, Q24H, Mansy, Jan A, MD, Last Rate: 250 mL/hr at 10/28/22 0554, 500 mg at 10/28/22 0554   cefTRIAXone (ROCEPHIN) 2 g in sodium chloride 0.9 % 100 mL IVPB, 2 g, Intravenous, Q24H, Mansy, Jan A, MD, Last Rate: 200 mL/hr at 10/28/22 0507, 2 g at 10/28/22 0507   chlorpheniramine-HYDROcodone (TUSSIONEX) 10-8 MG/5ML  suspension 5 mL, 5 mL, Oral, Q12H PRN, Mansy, Jan A, MD   donepezil (ARICEPT) tablet 10 mg, 10 mg, Oral, QHS, Dallie Piles, RPH, 10 mg at 10/27/22 2214   fluticasone furoate-vilanterol (BREO ELLIPTA) 100-25 MCG/ACT 1 puff, 1 puff, Inhalation, Daily, Beers, Shanon Brow, RPH, 1 puff at 10/27/22 0937   furosemide (LASIX) injection 40 mg, 40 mg, Intravenous, Q12H, Dwyane Dee, MD, 40 mg at 10/28/22 0657   guaiFENesin (MUCINEX) 12 hr tablet 600 mg, 600 mg, Oral, BID, Mansy, Jan A, MD, 600 mg at 10/27/22 2214   influenza vac split quadrivalent PF (FLUARIX) injection 0.5 mL, 0.5 mL, Intramuscular, Tomorrow-1000, Girguis, David, MD   ipratropium-albuterol (DUONEB) 0.5-2.5 (3) MG/3ML nebulizer solution 3 mL, 3 mL, Nebulization, BID, Dwyane Dee, MD, 3 mL at 10/27/22 2056   magnesium hydroxide (MILK OF MAGNESIA) suspension 30 mL, 30 mL, Oral, Daily PRN, Mansy, Jan A, MD   ondansetron (ZOFRAN) tablet 4 mg, 4 mg, Oral, Q6H PRN **OR** ondansetron (ZOFRAN) injection 4 mg, 4 mg, Intravenous, Q6H PRN, Mansy, Jan A, MD   pantoprazole (PROTONIX) injection 40 mg, 40 mg, Intravenous, BID, Dwyane Dee, MD, 40 mg at 10/27/22 2214   traZODone (DESYREL) tablet 25 mg, 25 mg, Oral, QHS PRN, Mansy, Jan A, MD, 25 mg at 10/26/22 2033   umeclidinium bromide (INCRUSE ELLIPTA) 62.5 MCG/ACT 1 puff, 1 puff, Inhalation, Daily, Beers, Shanon Brow, RPH, 1 puff at 10/27/22 0939  sodium chloride Stopped (10/25/22 0452)   azithromycin 500 mg (10/28/22 0554)   cefTRIAXone (ROCEPHIN)  IV 2 g (10/28/22 0507)    acetaminophen **OR** acetaminophen, chlorpheniramine-HYDROcodone, magnesium hydroxide, ondansetron **OR** ondansetron (ZOFRAN) IV, traZODone  Objective    Vitals:   10/27/22 1556 10/27/22 1910 10/27/22 2056 10/28/22 0516  BP: 128/75 138/79  (!) 141/84  Pulse: 77 96  (!) 101  Resp: '16 17  17  '$ Temp: 98 F (36.7 C) 97.9 F (36.6 C)  98.3 F (36.8 C)  TempSrc: Oral     SpO2: (!) 88% 96% 96% 95%  Weight:    115.4 kg   Height:         Physical Exam Vitals and nursing note reviewed.  Constitutional:      General: He is not in acute distress.    Appearance: He is obese. He is not ill-appearing, toxic-appearing or diaphoretic.  HENT:     Head: Normocephalic and atraumatic.     Nose: Nose normal.     Mouth/Throat:     Mouth: Mucous membranes are moist.     Pharynx: Oropharynx is clear.  Eyes:     General: No scleral icterus.    Extraocular Movements: Extraocular movements intact.  Cardiovascular:     Rate and Rhythm: Normal rate and regular rhythm.     Heart sounds: Murmur heard.     No friction rub. No gallop.  Pulmonary:     Effort: Pulmonary effort is normal. No respiratory distress.     Breath sounds: Wheezing present. No rhonchi or rales.  Abdominal:     General: Bowel sounds are normal. There is distension.     Palpations: Abdomen is soft.     Tenderness: There is no abdominal tenderness. There is no guarding or rebound.  Musculoskeletal:     Cervical back: Neck supple.     Right lower leg: Edema present.     Left lower leg: Edema present.  Skin:    General: Skin is warm and dry.     Coloration: Skin is not jaundiced or pale.  Neurological:     General: No focal deficit present.     Mental Status: He is alert and oriented to person, place, and time. Mental status is at baseline.  Psychiatric:        Mood and Affect: Mood normal.        Behavior: Behavior normal.        Thought Content: Thought content normal.        Judgment: Judgment normal.      Laboratory Data Recent Labs  Lab 10/26/22 0521 10/26/22 1605 10/27/22 0403 10/28/22 0440  WBC 13.5*  --  13.7* 12.1*  HGB 7.0* 8.3* 7.9* 8.2*  HCT 23.8* 27.3* 26.6* 28.6*  PLT 380  --  404* 419*  NEUTOPHILPCT 66  --  63 61  LYMPHOPCT 16  --  18 19  MONOPCT 13  --  14 13  EOSPCT 3  --  3 4   Recent Labs  Lab 10/26/22 0521 10/27/22 0403 10/28/22 0440  NA 135 138 137  K 3.1* 3.2* 3.4*  CL 98 101 99  CO2 '28 30 30   '$ BUN 12 8 5*  CREATININE 0.93 0.91 0.95  CALCIUM 8.2* 8.4* 8.6*  GLUCOSE 114* 91 94   Recent Labs  Lab 10/25/22 0424  INR 1.3*      Imaging Studies: No results found.  Assessment:   # Symptomatic anemia with melena with iron deficiency - p/w hgb 6.3 - base 8-9 - recent colonoscopy 01/2022  # GERD  #cognitive impairment #HFpEF # CAD s/p cabg # h/o CVA # COPD  # Sepsis 2/2 pna- resolving  # EtOH and Tobacco dependence  Plan:  Plan for EGD today Npo since midnight Labs reviewed- hgb stabilized at 8.2; remains hemodynamically stable Continue iron infusions Continue Protonix 40 mg iv q12 h Hold dvt ppx Monitor H&H.  Transfusion and resuscitation as per primary team Avoid frequent lab draws to prevent lab induced anemia Supportive care and antiemetics as per primary team Maintain two sites IV access Avoid nsaids Monitor for GIB.  Stool occult blood test has no role in evaluation of anemia and is a test for colon cancer screening and is inappropriate to be used for evaluation of anemia/gastrointestinal bleeding, as a negative or positive test would not change evaluation.  Further recommendations pending endoscopy. Please see op report for further details  Esophagogastroduodenoscopy with possible biopsy, control of bleeding, polypectomy, and interventions as necessary has been discussed with the patient/patient representative. Informed consent was obtained from the patient/patient representative after explaining the indication, nature, and risks of the procedure including but not limited to death, bleeding, perforation, missed neoplasm/lesions, cardiorespiratory compromise, and reaction to medications. Opportunity for questions was given and appropriate answers were provided. Patient/patient representative has verbalized understanding is amenable to undergoing the procedure.  I personally performed the service.  Management of other medical comorbidities as per primary  team  Thank you for allowing [place] to participate in this patient's care. Please don't hesitate to call if any questions or concerns arise.   Annamaria Helling, DO [facility]  Portions of the record may have been created with voice recognition software. Occasional wrong-word or 'sound-a-like' substitutions may have occurred due to the inherent limitations of voice recognition software.  Read the chart carefully and recognize, using context, where substitutions may have occurred.

## 2022-10-28 NOTE — Anesthesia Postprocedure Evaluation (Addendum)
Anesthesia Post Note  Patient: [name]  Procedure(s) Performed: ESOPHAGOGASTRODUODENOSCOPY (EGD) WITH PROPOFOL  Patient location during evaluation: PACU Anesthesia Type: General Level of consciousness: awake Pain management: pain level controlled Vital Signs Assessment: post-procedure vital signs reviewed and stable Respiratory status: spontaneous breathing, nonlabored ventilation, respiratory function stable and patient connected to nasal cannula oxygen Cardiovascular status: blood pressure returned to baseline and stable Postop Assessment: no apparent nausea or vomiting Anesthetic complications: no   No notable events documented.   Last Vitals:  Vitals:   10/28/22 0845 10/28/22 0921  BP: 138/80 125/78  Pulse: (!) 101 (!) 34  Resp: 16 (!) 24  Temp: 36.7 C (!) 36.4 C  SpO2: 95% 90%    Last Pain:  Vitals:   10/28/22 0921  TempSrc:   PainSc: 0-No pain                 Ilene Qua

## 2022-10-28 NOTE — Plan of Care (Signed)
Patient axox4, EGD today biopsy done, will have colonoscopy Monday. No c/o pain. Continent B&B. Takes meds whole. Ambulates with standby assist.  Problem: Fluid Volume: Goal: Hemodynamic stability will improve Outcome: Progressing   Problem: Clinical Measurements: Goal: Diagnostic test results will improve Outcome: Progressing Goal: Signs and symptoms of infection will decrease Outcome: Progressing   Problem: Respiratory: Goal: Ability to maintain adequate ventilation will improve Outcome: Progressing   Problem: Education: Goal: Knowledge of General Education information will improve Description: Including pain rating scale, medication(s)/side effects and non-pharmacologic comfort measures Outcome: Progressing   Problem: Clinical Measurements: Goal: Ability to maintain clinical measurements within normal limits will improve Outcome: Progressing Goal: Will remain free from infection Outcome: Progressing Goal: Diagnostic test results will improve Outcome: Progressing Goal: Respiratory complications will improve Outcome: Progressing Goal: Cardiovascular complication will be avoided Outcome: Progressing   Problem: Health Behavior/Discharge Planning: Goal: Ability to manage health-related needs will improve Outcome: Progressing   Problem: Nutrition: Goal: Adequate nutrition will be maintained Outcome: Progressing   Problem: Activity: Goal: Risk for activity intolerance will decrease Outcome: Progressing   Problem: Coping: Goal: Level of anxiety will decrease Outcome: Progressing   Problem: Elimination: Goal: Will not experience complications related to bowel motility Outcome: Progressing Goal: Will not experience complications related to urinary retention Outcome: Progressing   Problem: Pain Managment: Goal: General experience of comfort will improve Outcome: Progressing   Problem: Education: Goal: Ability to describe self-care measures that may prevent or  decrease complications (Diabetes Survival Skills Education) will improve Outcome: Progressing Goal: Individualized Educational Video(s) Outcome: Progressing   Problem: Skin Integrity: Goal: Risk for impaired skin integrity will decrease Outcome: Progressing   Problem: Coping: Goal: Ability to adjust to condition or change in health will improve Outcome: Progressing   Problem: Metabolic: Goal: Ability to maintain appropriate glucose levels will improve Outcome: Progressing   Problem: Nutritional: Goal: Maintenance of adequate nutrition will improve Outcome: Progressing Goal: Progress toward achieving an optimal weight will improve Outcome: Progressing   Problem: Tissue Perfusion: Goal: Adequacy of tissue perfusion will improve Outcome: Progressing

## 2022-10-28 NOTE — Addendum Note (Signed)
Addendum  created 10/28/22 1414 by Khloee Garza L, MD   Clinical Note Signed    

## 2022-10-28 NOTE — Progress Notes (Addendum)
56 - received report 1950 - obtained vital signs, fingerstick blood glucose assessed, urinal emptied 2016 - scheduled hs medications administered with prn medication per physician's order, snack and beverage provided, urinal emptied 2215 - replaced telemetry battery and leads, provided beverage per pt's request 2311 - pt right side lying in bed with eyes closed, respirations noted to be even and unlabored, no obvious signs of distress or discomfort noted 0000 - resting with eyes closed lying right side, respirations even and unlabored, no signs of discomfort noted 0202 - Pt resting supine with eyes closed, respirations noted to be even and unlabored, no obvious signs of distress or discomfort noted, urinal emptied 0344 - pt resting in bed lying right side with eyes closed, respirations even and unlabored, no signs of distress or discomfort 0439 - obtain VS, offered and accepted fresh ice water 0534 - scheduled medication administration, pt requests coffee

## 2022-10-28 NOTE — Progress Notes (Signed)
Progress Note    [name]   IEP:[id number]  DOB: 09-Mar-1961  DOA: 10/25/2022     3 PCP: [place]  Initial CC: SOB  Hospital Course: [name] is a 61 y.o. Caucasian male with medical history significant for COPD, CVA, HTN, CAD who presented to the ER with acute onset of left leg and foot numbness which has been worsening but has been going on for months as well as bilateral lower extremity edema, recent dyspnea with associated nonproductive cough and chest pain.  He has been occasionally expectorating with his cough.  No fever or chills.  He had 3 vaccine injections for COVID-19.  No nausea or vomiting or diarrhea or abdominal pain.  He has been having urinary urgency without dysuria or hematuria or flank pain.  He admits to dyspnea on exertion and mild orthopnea.   In the ER, vital signs were within normal and later respiratory rate was 23 and later BP was 154/108.  Labs revealed hyponatremia of 131 and CO2 of 18.  IR panel revealed low serum iron and high TIBC with low ferritin and low saturation.  CBC showed leukocytosis at 13.9 with hemoglobin of 6.3 and hematocrit 23.2, and platelets were 473.  INR was 1.3 and PT 15.6. Stool Hemoccult came back negative. CXR showed pulmonary vascular congestion.  He was ordered 1 unit PRBC and started on lasix as well.   Interval History:  Underwent EGD today. No obvious source for his anemia. Tentative plan is for colonoscopy once respiratory status improves again; became hypoxic some and wheezing after procedure.  Adding on steroid nebs and solumedrol today. Remains on pretty optimal therapy from an abx and lasix standpoint as well as breathing treatments etc.  He was wheezing when I saw him after procedure but no distress.  Edema in legs tremendously better compared to admission.   Assessment and Plan: * Symptomatic anemia - LDH normal.  Smear showing unremarkable morphology - Hgb 6.3 g/dL on admission; baseline around  8-9 g/dL - iron stores low as well; will give INFED also, 10/25/22 - s/p PRBC however Hgb still downtrending; increasing concern for underlying GIB - GI consulted for further assistance - last colonoscopy Feb 2023 with 3 polyps removed; negative pathology - continue trending Hgb; transfusing for Hgb <8 g/dL given CHF - EGD performed 10/28/2022; unrevealing for etiology of anemia -Tentative plan for colonoscopy once further improvement in respiratory status per GI  Sepsis due to pneumonia (HCC)-resolved as of 10/27/2022 - some concern for infiltrate in RLL vs congestion - continue diuresis and abx for now; complete 5 days antibiotics - trend PCT  COPD with acute exacerbation ([place]) - noted to have more wheezing post EGD - will add on trial of systemic steroids today - add budesonide nebs as well - continue scheduled duonebs, abx, breo ellipta, and incruse ellipta   Acute diastolic CHF (congestive heart failure) (HCC) - PND, pulmonary crackles, pulm edema, B/L LE edema, DOE/SOB - recent echo on 10/31: EF 60-65%, Gr II DD. Moderate AS - dilated IVC concerning for RHF due to overload - continue diuresis; diuresing well - trend BMP on lasix - patient recommended for cardiology outpt follow up already by PCP  Dyslipidemia - continue statin  GERD without esophagitis - continue PPI  Essential hypertension - Continue Lasix; further home medications on hold in setting of possible GI bleed  Cognitive impairment - continue Aricept.   Old records reviewed in assessment of this patient  Antimicrobials: Azithro 11/8 >>  current Rocephin 11/8 >> current   DVT prophylaxis:  SCD    Code Status:   Code Status: Full Code  Mobility Assessment (last 72 hours)     Mobility Assessment     Row Name 10/28/22 0750 10/27/22 1950 10/27/22 0830 10/26/22 2030 10/26/22 0828   Does patient have an order for bedrest or is patient medically unstable No - Continue assessment No - Continue  assessment No - Continue assessment No - Continue assessment No - Continue assessment   What is the highest level of mobility based on the progressive mobility assessment? Level 4 (Walks with assist in room) - Balance while marching in place and cannot step forward and back - Complete Level 4 (Walks with assist in room) - Balance while marching in place and cannot step forward and back - Complete Level 5 (Walks with assist in room/hall) - Balance while stepping forward/back and can walk in room with assist - Complete Level 5 (Walks with assist in room/hall) - Balance while stepping forward/back and can walk in room with assist - Complete Level 5 (Walks with assist in room/hall) - Balance while stepping forward/back and can walk in room with assist - Complete    Row Name 10/25/22 2212 10/25/22 1815         Does patient have an order for bedrest or is patient medically unstable No - Continue assessment No - Continue assessment      What is the highest level of mobility based on the progressive mobility assessment? Level 5 (Walks with assist in room/hall) - Balance while stepping forward/back and can walk in room with assist - Complete Level 4 (Walks with assist in room) - Balance while marching in place and cannot step forward and back - Complete               Barriers to discharge: Disposition Plan:  Home Status is: Inpt  Objective: Blood pressure 123/73, pulse 100, temperature 98 F (36.7 C), resp. rate 17, height 6' (1.829 m), weight 115.4 kg, SpO2 95 %.  Examination:  Physical Exam Constitutional:      General: He is not in acute distress.    Appearance: Normal appearance.  HENT:     Head: Normocephalic and atraumatic.     Mouth/Throat:     Mouth: Mucous membranes are moist.  Eyes:     Extraocular Movements: Extraocular movements intact.  Cardiovascular:     Rate and Rhythm: Normal rate and regular rhythm.     Heart sounds: Normal heart sounds.  Pulmonary:     Breath sounds:  No wheezing.     Comments: Improved sounds overall Abdominal:     General: Bowel sounds are normal. There is distension.     Palpations: Abdomen is soft.     Tenderness: There is no abdominal tenderness.     Comments: Improved distension and more soft  Musculoskeletal:        General: Swelling present. Normal range of motion.     Cervical back: Normal range of motion and neck supple.     Comments: Edema: Improved from 2+ now trace to ~1+ in B/L LE  Skin:    General: Skin is warm and dry.  Neurological:     General: No focal deficit present.     Mental Status: He is alert.  Psychiatric:        Mood and Affect: Mood normal.        Behavior: Behavior normal.      Consultants:  GI  Procedures:  11/11: EGD  Data Reviewed: Results for orders placed or performed during the hospital encounter of 10/25/22 (from the past 24 hour(s))  Basic metabolic panel     Status: Abnormal   Collection Time: 10/28/22  4:40 AM  Result Value Ref Range   Sodium 137 135 - 145 mmol/L   Potassium 3.4 (L) 3.5 - 5.1 mmol/L   Chloride 99 98 - 111 mmol/L   CO2 30 22 - 32 mmol/L   Glucose, Bld 94 70 - 99 mg/dL   BUN 5 (L) 8 - 23 mg/dL   Creatinine, Ser 0.95 0.61 - 1.24 mg/dL   Calcium 8.6 (L) 8.9 - 10.3 mg/dL   GFR, Estimated >60 >60 mL/min   Anion gap 8 5 - 15  CBC with Differential/Platelet     Status: Abnormal   Collection Time: 10/28/22  4:40 AM  Result Value Ref Range   WBC 12.1 (H) 4.0 - 10.5 K/uL   RBC 3.86 (L) 4.22 - 5.81 MIL/uL   Hemoglobin 8.2 (L) 13.0 - 17.0 g/dL   HCT 28.6 (L) 39.0 - 52.0 %   MCV 74.1 (L) 80.0 - 100.0 fL   MCH 21.2 (L) 26.0 - 34.0 pg   MCHC 28.7 (L) 30.0 - 36.0 g/dL   RDW 19.8 (H) 11.5 - 15.5 %   Platelets 419 (H) 150 - 400 K/uL   nRBC 0.0 0.0 - 0.2 %   Neutrophils Relative % 61 %   Neutro Abs 7.5 1.7 - 7.7 K/uL   Lymphocytes Relative 19 %   Lymphs Abs 2.2 0.7 - 4.0 K/uL   Monocytes Relative 13 %   Monocytes Absolute 1.6 (H) 0.1 - 1.0 K/uL   Eosinophils  Relative 4 %   Eosinophils Absolute 0.5 0.0 - 0.5 K/uL   Basophils Relative 2 %   Basophils Absolute 0.2 (H) 0.0 - 0.1 K/uL   Immature Granulocytes 1 %   Abs Immature Granulocytes 0.10 (H) 0.00 - 0.07 K/uL  Magnesium     Status: None   Collection Time: 10/28/22  4:40 AM  Result Value Ref Range   Magnesium 2.0 1.7 - 2.4 mg/dL  Procalcitonin     Status: None   Collection Time: 10/28/22  4:40 AM  Result Value Ref Range   Procalcitonin <0.10 ng/mL    I have Reviewed nursing notes, Vitals, and Lab results since pt's last encounter. Pertinent lab results : see above I have ordered test including BMP, CBC, Mg I have reviewed the last note from staff over past 24 hours I have discussed pt's care plan and test results with nursing staff, case manager   LOS: 3 days   Dwyane Dee, MD [facility] 10/28/2022, 1:24 PM

## 2022-10-28 NOTE — Interval H&P Note (Signed)
History and Physical Interval Note: Preprocedure H&P from 10/28/22  was reviewed and there was no interval change after seeing and examining the patient.  Written consent was obtained from the patient after discussion of risks, benefits, and alternatives. Patient has consented to proceed with Esophagogastroduodenoscopy with possible intervention   10/28/2022 8:52 AM  [name]  has presented today for surgery, with the diagnosis of symptomatic anemia.  The various methods of treatment have been discussed with the patient and family. After consideration of risks, benefits and other options for treatment, the patient has consented to  Procedure(s): ESOPHAGOGASTRODUODENOSCOPY (EGD) WITH PROPOFOL (N/A) as a surgical intervention.  The patient's history has been reviewed, patient examined, no change in status, stable for surgery.  I have reviewed the patient's chart and labs.  Questions were answered to the patient's satisfaction.     [name]

## 2022-10-29 DIAGNOSIS — D649 Anemia, unspecified: Secondary | ICD-10-CM | POA: Diagnosis not present

## 2022-10-29 DIAGNOSIS — E662 Morbid (severe) obesity with alveolar hypoventilation: Secondary | ICD-10-CM

## 2022-10-29 DIAGNOSIS — I5031 Acute diastolic (congestive) heart failure: Secondary | ICD-10-CM | POA: Diagnosis not present

## 2022-10-29 DIAGNOSIS — J441 Chronic obstructive pulmonary disease with (acute) exacerbation: Secondary | ICD-10-CM | POA: Diagnosis not present

## 2022-10-29 DIAGNOSIS — J189 Pneumonia, unspecified organism: Secondary | ICD-10-CM | POA: Diagnosis not present

## 2022-10-29 LAB — CBC WITH DIFFERENTIAL/PLATELET
Abs Immature Granulocytes: 0.06 10*3/uL (ref 0.00–0.07)
Basophils Absolute: 0 10*3/uL (ref 0.0–0.1)
Basophils Relative: 0 %
Eosinophils Absolute: 0 10*3/uL (ref 0.0–0.5)
Eosinophils Relative: 0 %
HCT: 32 % — ABNORMAL LOW (ref 39.0–52.0)
Hemoglobin: 9.2 g/dL — ABNORMAL LOW (ref 13.0–17.0)
Immature Granulocytes: 1 %
Lymphocytes Relative: 9 %
Lymphs Abs: 0.7 10*3/uL (ref 0.7–4.0)
MCH: 21.8 pg — ABNORMAL LOW (ref 26.0–34.0)
MCHC: 28.8 g/dL — ABNORMAL LOW (ref 30.0–36.0)
MCV: 75.8 fL — ABNORMAL LOW (ref 80.0–100.0)
Monocytes Absolute: 0.1 10*3/uL (ref 0.1–1.0)
Monocytes Relative: 1 %
Neutro Abs: 6.5 10*3/uL (ref 1.7–7.7)
Neutrophils Relative %: 89 %
Platelets: 441 10*3/uL — ABNORMAL HIGH (ref 150–400)
RBC: 4.22 MIL/uL (ref 4.22–5.81)
RDW: 21.5 % — ABNORMAL HIGH (ref 11.5–15.5)
Smear Review: NORMAL
WBC: 7.3 10*3/uL (ref 4.0–10.5)
nRBC: 0 % (ref 0.0–0.2)

## 2022-10-29 LAB — GLUCOSE, CAPILLARY
Glucose-Capillary: 234 mg/dL — ABNORMAL HIGH (ref 70–99)
Glucose-Capillary: 175 mg/dL — ABNORMAL HIGH (ref 70–99)
Glucose-Capillary: 195 mg/dL — ABNORMAL HIGH (ref 70–99)
Glucose-Capillary: 210 mg/dL — ABNORMAL HIGH (ref 70–99)

## 2022-10-29 LAB — BASIC METABOLIC PANEL
Anion gap: 7 (ref 5–15)
BUN: 8 mg/dL (ref 8–23)
CO2: 28 mmol/L (ref 22–32)
Calcium: 9 mg/dL (ref 8.9–10.3)
Chloride: 100 mmol/L (ref 98–111)
Creatinine, Ser: 0.85 mg/dL (ref 0.61–1.24)
GFR, Estimated: >60 mL/min (ref >60)
Glucose, Bld: 169 mg/dL — ABNORMAL HIGH (ref 70–99)
Potassium: 4.1 mmol/L (ref 3.5–5.1)
Sodium: 135 mmol/L (ref 135–145)

## 2022-10-29 LAB — MAGNESIUM: Magnesium: 2.1 mg/dL (ref 1.7–2.4)

## 2022-10-29 MED ORDER — FUROSEMIDE 10 MG/ML IJ SOLN
40.0000 mg | Freq: Every day | INTRAMUSCULAR | Status: DC
  Administered 2022-10-30: 40 mg via INTRAVENOUS
  Filled 2022-10-29: qty 4

## 2022-10-29 NOTE — Progress Notes (Signed)
Mobility Specialist - Progress Note  During mobility: SpO2(93) Post-mobility: SPO2 (95)    10/29/22 1000  Mobility  Activity Ambulated independently in hallway;Stood at bedside  Level of Assistance Independent  Assistive Device None  Distance Ambulated (ft) 320 ft  Activity Response Tolerated well  Mobility Referral Yes  $Mobility charge 1 Mobility   Pt sitting EOB on RA upon entry. Pt STS and ambulates in hallway Indep. Pt expressed SOB on second lap around NS and returned to EOB. Pt left with needs in reach.    Loma Sender Mobility Specialist 10/29/22, 10:35 AM

## 2022-10-29 NOTE — Progress Notes (Signed)
Progress Note    [name]   UXL:[id number]  DOB: 09-May-1961  DOA: 10/25/2022     4 PCP: [place]  Initial CC: SOB  Hospital Course: [name] is a 61 y.o. Caucasian male with medical history significant for COPD, CVA, HTN, CAD who presented to the ER with acute onset of left leg and foot numbness which has been worsening but has been going on for months as well as bilateral lower extremity edema, recent dyspnea with associated nonproductive cough and chest pain.  He has been occasionally expectorating with his cough.  No fever or chills.  He had 3 vaccine injections for COVID-19.  No nausea or vomiting or diarrhea or abdominal pain.  He has been having urinary urgency without dysuria or hematuria or flank pain.  He admits to dyspnea on exertion and mild orthopnea.   In the ER, vital signs were within normal and later respiratory rate was 23 and later BP was 154/108.  Labs revealed hyponatremia of 131 and CO2 of 18.  IR panel revealed low serum iron and high TIBC with low ferritin and low saturation.  CBC showed leukocytosis at 13.9 with hemoglobin of 6.3 and hematocrit 23.2, and platelets were 473.  INR was 1.3 and PT 15.6. Stool Hemoccult came back negative. CXR showed pulmonary vascular congestion.  He was ordered 1 unit PRBC and started on lasix as well.   Interval History:  No events overnight. Resting in bed comfortably this morning. He was able to ambulate with mobility this morning as well.   Assessment and Plan: * Symptomatic anemia - LDH normal.  Smear showing unremarkable morphology - Hgb 6.3 g/dL on admission; baseline around 8-9 g/dL - iron stores low as well; will give INFED also, 10/25/22 - s/p PRBC however Hgb still downtrending; increasing concern for underlying GIB - GI consulted for further assistance - last colonoscopy Feb 2023 with 3 polyps removed; negative pathology - continue trending Hgb; transfusing for Hgb <8 g/dL given CHF - EGD  performed 10/28/2022; unrevealing for etiology of anemia -plan for colonoscopy TBD, contingent on respiratory status and further GI evaluation   Sepsis due to pneumonia (HCC)-resolved as of 10/27/2022 - some concern for infiltrate in RLL vs congestion - continue diuresis and abx for now; complete 5 days antibiotics - trend PCT  COPD with acute exacerbation ([place]) - noted to have more wheezing post EGD; improved after adding systemic steroids - continue budesonide, duonebs, abx, breo ellipta, and incruse ellipta  Acute diastolic CHF (congestive heart failure) (HCC) - PND, pulmonary crackles, pulm edema, B/L LE edema, DOE/SOB - recent echo on 10/31: EF 60-65%, Gr II DD. Moderate AS - dilated IVC concerning for RHF due to overload - continue diuresis; diuresing well - trend BMP on lasix - patient recommended for cardiology outpt follow up already by PCP  Obesity hypoventilation syndrome ([place]) - likely has some hypoxia with sedation and at night/sleeping - has home O2 he uses at baseline  Dyslipidemia - continue statin  GERD without esophagitis - continue PPI  Essential hypertension - Continue Lasix; further home medications on hold in setting of possible GI bleed  Cognitive impairment - continue Aricept.   Old records reviewed in assessment of this patient  Antimicrobials: Azithro 11/8 >> 11/12 Rocephin 11/8 >> 11/12   DVT prophylaxis:  SCD    Code Status:   Code Status: Full Code  Mobility Assessment (last 72 hours)     Mobility Assessment     Row Name  10/28/22 2016 10/28/22 0750 10/27/22 1950 10/27/22 0830 10/26/22 2030   Does patient have an order for bedrest or is patient medically unstable No - Continue assessment No - Continue assessment No - Continue assessment No - Continue assessment No - Continue assessment   What is the highest level of mobility based on the progressive mobility assessment? Level 5 (Walks with assist in room/hall) - Balance while stepping  forward/back and can walk in room with assist - Complete Level 4 (Walks with assist in room) - Balance while marching in place and cannot step forward and back - Complete Level 4 (Walks with assist in room) - Balance while marching in place and cannot step forward and back - Complete Level 5 (Walks with assist in room/hall) - Balance while stepping forward/back and can walk in room with assist - Complete Level 5 (Walks with assist in room/hall) - Balance while stepping forward/back and can walk in room with assist - Complete            Barriers to discharge: Disposition Plan:  Home Status is: Inpt  Objective: Blood pressure (!) 151/79, pulse (!) 102, temperature 97.7 F (36.5 C), temperature source Oral, resp. rate 17, height 6' (1.829 m), weight 108.1 kg, SpO2 100 %.  Examination:  Physical Exam Constitutional:      General: He is not in acute distress.    Appearance: Normal appearance.  HENT:     Head: Normocephalic and atraumatic.     Mouth/Throat:     Mouth: Mucous membranes are moist.  Eyes:     Extraocular Movements: Extraocular movements intact.  Cardiovascular:     Rate and Rhythm: Normal rate and regular rhythm.     Heart sounds: Normal heart sounds.  Pulmonary:     Breath sounds: No wheezing.     Comments: Improved sounds overall Abdominal:     General: Bowel sounds are normal. There is distension.     Palpations: Abdomen is soft.     Tenderness: There is no abdominal tenderness.     Comments: Improved distension and more soft  Musculoskeletal:        General: Swelling present. Normal range of motion.     Cervical back: Normal range of motion and neck supple.     Comments: Edema: Improved from 2+ now trace to ~1+ in B/L LE  Skin:    General: Skin is warm and dry.  Neurological:     General: No focal deficit present.     Mental Status: He is alert.  Psychiatric:        Mood and Affect: Mood normal.        Behavior: Behavior normal.      Consultants:   GI  Procedures:  11/11: EGD  Data Reviewed: Results for orders placed or performed during the hospital encounter of 10/25/22 (from the past 24 hour(s))  Glucose, capillary     Status: Abnormal   Collection Time: 10/28/22  6:05 PM  Result Value Ref Range   Glucose-Capillary 170 (H) 70 - 99 mg/dL  Glucose, capillary     Status: Abnormal   Collection Time: 10/28/22  7:55 PM  Result Value Ref Range   Glucose-Capillary 128 (H) 70 - 99 mg/dL  CBC with Differential/Platelet     Status: Abnormal   Collection Time: 10/29/22  4:30 AM  Result Value Ref Range   WBC 7.3 4.0 - 10.5 K/uL   RBC 4.22 4.22 - 5.81 MIL/uL   Hemoglobin 9.2 (L) 13.0 - 17.0  g/dL   HCT 32.0 (L) 39.0 - 52.0 %   MCV 75.8 (L) 80.0 - 100.0 fL   MCH 21.8 (L) 26.0 - 34.0 pg   MCHC 28.8 (L) 30.0 - 36.0 g/dL   RDW 21.5 (H) 11.5 - 15.5 %   Platelets 441 (H) 150 - 400 K/uL   nRBC 0.0 0.0 - 0.2 %   Neutrophils Relative % 89 %   Neutro Abs 6.5 1.7 - 7.7 K/uL   Lymphocytes Relative 9 %   Lymphs Abs 0.7 0.7 - 4.0 K/uL   Monocytes Relative 1 %   Monocytes Absolute 0.1 0.1 - 1.0 K/uL   Eosinophils Relative 0 %   Eosinophils Absolute 0.0 0.0 - 0.5 K/uL   Basophils Relative 0 %   Basophils Absolute 0.0 0.0 - 0.1 K/uL   WBC Morphology MORPHOLOGY UNREMARKABLE    Smear Review Normal platelet morphology    Immature Granulocytes 1 %   Abs Immature Granulocytes 0.06 0.00 - 0.07 K/uL   Dimorphism PRESENT    Polychromasia PRESENT   Magnesium     Status: None   Collection Time: 10/29/22  4:30 AM  Result Value Ref Range   Magnesium 2.1 1.7 - 2.4 mg/dL  Basic metabolic panel     Status: Abnormal   Collection Time: 10/29/22  4:30 AM  Result Value Ref Range   Sodium 135 135 - 145 mmol/L   Potassium 4.1 3.5 - 5.1 mmol/L   Chloride 100 98 - 111 mmol/L   CO2 28 22 - 32 mmol/L   Glucose, Bld 169 (H) 70 - 99 mg/dL   BUN 8 8 - 23 mg/dL   Creatinine, Ser 0.85 0.61 - 1.24 mg/dL   Calcium 9.0 8.9 - 10.3 mg/dL   GFR, Estimated >60 >60  mL/min   Anion gap 7 5 - 15  Glucose, capillary     Status: Abnormal   Collection Time: 10/29/22  7:31 AM  Result Value Ref Range   Glucose-Capillary 234 (H) 70 - 99 mg/dL  Glucose, capillary     Status: Abnormal   Collection Time: 10/29/22 11:35 AM  Result Value Ref Range   Glucose-Capillary 175 (H) 70 - 99 mg/dL    I have Reviewed nursing notes, Vitals, and Lab results since pt's last encounter. Pertinent lab results : see above I have ordered test including BMP, CBC, Mg I have reviewed the last note from staff over past 24 hours I have discussed pt's care plan and test results with nursing staff, case manager   LOS: 4 days   Dwyane Dee, MD [facility] 10/29/2022, 1:44 PM

## 2022-10-29 NOTE — Assessment & Plan Note (Signed)
-   likely has some hypoxia with sedation and at night/sleeping - has home O2 he uses at baseline

## 2022-10-29 NOTE — Progress Notes (Signed)
Inpatient Follow-up/Progress Note   Patient ID: [name] is a 61 y.o. male.  Overnight Events / Subjective Findings NAEON. EGD yesterday was relatively unremarkable for bleeding source. Hgb stable and uptrending at 9.2. no further bowel movements. No other acute gi complaints  Review of Systems  Constitutional:  Negative for activity change, appetite change, chills, diaphoresis, fatigue, fever and unexpected weight change.  HENT:  Negative for trouble swallowing and voice change.   Respiratory:  Positive for wheezing. Negative for shortness of breath.   Cardiovascular:  Negative for chest pain, palpitations and leg swelling.  Gastrointestinal:  Negative for abdominal distention, abdominal pain, anal bleeding, blood in stool, constipation, diarrhea, nausea and vomiting.  Musculoskeletal:  Negative for arthralgias and myalgias.  Skin:  Negative for color change and pallor.  Neurological:  Negative for dizziness, syncope and weakness.  Psychiatric/Behavioral:  Negative for confusion. The patient is not nervous/anxious.   All other systems reviewed and are negative.    Medications  Current Facility-Administered Medications:    0.9 %  sodium chloride infusion (Manually program via Guardrails IV Fluids), , Intravenous, Once, Dwyane Dee, MD   0.9 %  sodium chloride infusion, 10 mL/hr, Intravenous, Once, Ward, Delice Bison, DO, Held at 10/25/22 1829   acetaminophen (TYLENOL) tablet 650 mg, 650 mg, Oral, Q6H PRN, 650 mg at 10/27/22 0938 **OR** acetaminophen (TYLENOL) suppository 650 mg, 650 mg, Rectal, Q6H PRN, Mansy, Jan A, MD   budesonide (PULMICORT) nebulizer solution 0.5 mg, 0.5 mg, Nebulization, BID, Dwyane Dee, MD, 0.5 mg at 10/28/22 2100   chlorpheniramine-HYDROcodone (TUSSIONEX) 10-8 MG/5ML suspension 5 mL, 5 mL, Oral, Q12H PRN, Mansy, Jan A, MD   donepezil (ARICEPT) tablet 10 mg, 10 mg, Oral, QHS, Dallie Piles, RPH, 10 mg at 10/28/22 2017   fluticasone furoate-vilanterol  (BREO ELLIPTA) 100-25 MCG/ACT 1 puff, 1 puff, Inhalation, Daily, Beers, Shanon Brow, RPH, 1 puff at 10/28/22 1042   furosemide (LASIX) injection 40 mg, 40 mg, Intravenous, Q12H, Dwyane Dee, MD, 40 mg at 10/29/22 0535   guaiFENesin (MUCINEX) 12 hr tablet 600 mg, 600 mg, Oral, BID, Mansy, Jan A, MD, 600 mg at 10/28/22 2017   influenza vac split quadrivalent PF (FLUARIX) injection 0.5 mL, 0.5 mL, Intramuscular, Tomorrow-1000, Girguis, David, MD   insulin aspart (novoLOG) injection 0-15 Units, 0-15 Units, Subcutaneous, TID WC, Dwyane Dee, MD, 3 Units at 10/28/22 1826   insulin aspart (novoLOG) injection 0-5 Units, 0-5 Units, Subcutaneous, QHS, Girguis, David, MD   ipratropium-albuterol (DUONEB) 0.5-2.5 (3) MG/3ML nebulizer solution 3 mL, 3 mL, Nebulization, BID, Dwyane Dee, MD, 3 mL at 10/28/22 2059   ipratropium-albuterol (DUONEB) 0.5-2.5 (3) MG/3ML nebulizer solution 3 mL, 3 mL, Nebulization, Q6H PRN, Dwyane Dee, MD   magnesium hydroxide (MILK OF MAGNESIA) suspension 30 mL, 30 mL, Oral, Daily PRN, Mansy, Jan A, MD   methylPREDNISolone sodium succinate (SOLU-MEDROL) 125 mg/2 mL injection 60 mg, 60 mg, Intravenous, BID, Dwyane Dee, MD, 60 mg at 10/28/22 1828   ondansetron (ZOFRAN) tablet 4 mg, 4 mg, Oral, Q6H PRN **OR** ondansetron (ZOFRAN) injection 4 mg, 4 mg, Intravenous, Q6H PRN, Mansy, Jan A, MD   pantoprazole (PROTONIX) EC tablet 40 mg, 40 mg, Oral, Q1200, Annamaria Helling, DO, 40 mg at 10/28/22 1047   traZODone (DESYREL) tablet 25 mg, 25 mg, Oral, QHS PRN, Mansy, Jan A, MD, 25 mg at 10/28/22 2017   umeclidinium bromide (INCRUSE ELLIPTA) 62.5 MCG/ACT 1 puff, 1 puff, Inhalation, Daily, Beers, Shanon Brow, RPH, 1 puff at 10/28/22 1041  sodium chloride Stopped (10/25/22 0452)    acetaminophen **OR** acetaminophen, chlorpheniramine-HYDROcodone, ipratropium-albuterol, magnesium hydroxide, ondansetron **OR** ondansetron (ZOFRAN) IV, traZODone   Objective    Vitals:   10/28/22  1950 10/28/22 2059 10/29/22 0444 10/29/22 0736  BP: 134/88  (!) 140/83 (!) 151/79  Pulse: (!) 101  (!) 108 (!) 102  Resp: '20  18 17  '$ Temp: 98.9 F (37.2 C)  98 F (36.7 C) 97.7 F (36.5 C)  TempSrc: Oral  Oral Oral  SpO2:  96%  99%  Weight:      Height:         Physical Exam Vitals and nursing note reviewed.  Constitutional:      General: He is not in acute distress.    Appearance: He is obese. He is not ill-appearing, toxic-appearing or diaphoretic.  HENT:     Head: Normocephalic and atraumatic.     Nose: Nose normal.     Mouth/Throat:     Mouth: Mucous membranes are moist.     Pharynx: Oropharynx is clear.  Eyes:     General: No scleral icterus.    Extraocular Movements: Extraocular movements intact.  Cardiovascular:     Rate and Rhythm: Regular rhythm. Tachycardia present.     Heart sounds: Murmur heard.     No friction rub. No gallop.  Pulmonary:     Effort: Pulmonary effort is normal. No respiratory distress.     Breath sounds: Wheezing present. No rhonchi or rales.  Abdominal:     General: Bowel sounds are normal. There is distension.     Palpations: Abdomen is soft.     Tenderness: There is no abdominal tenderness. There is no guarding or rebound.  Musculoskeletal:     Cervical back: Neck supple.     Right lower leg: Edema present.     Left lower leg: Edema present.  Skin:    General: Skin is warm and dry.     Coloration: Skin is not jaundiced or pale.  Neurological:     General: No focal deficit present.     Mental Status: He is alert and oriented to person, place, and time. Mental status is at baseline.  Psychiatric:        Mood and Affect: Mood normal.        Behavior: Behavior normal.        Thought Content: Thought content normal.        Judgment: Judgment normal.      Laboratory Data Recent Labs  Lab 10/27/22 0403 10/28/22 0440 10/29/22 0430  WBC 13.7* 12.1* 7.3  HGB 7.9* 8.2* 9.2*  HCT 26.6* 28.6* 32.0*  PLT 404* 419* 441*   NEUTOPHILPCT 63 61 89  LYMPHOPCT '18 19 9  '$ MONOPCT '14 13 1  '$ EOSPCT 3 4 0   Recent Labs  Lab 10/27/22 0403 10/28/22 0440 10/29/22 0430  NA 138 137 135  K 3.2* 3.4* 4.1  CL 101 99 100  CO2 '30 30 28  '$ BUN 8 5* 8  CREATININE 0.91 0.95 0.85  CALCIUM 8.4* 8.6* 9.0  GLUCOSE 91 94 169*   Recent Labs  Lab 10/25/22 0424  INR 1.3*      Imaging Studies: No results found.  Assessment:   # Symptomatic anemia with melena with iron deficiency - p/w hgb 6.3 - base 8-9- currently 9.2 and uptrending - recent colonoscopy 01/2022   # GERD   #cognitive impairment #HFpEF # CAD s/p cabg # h/o CVA # COPD   # Sepsis 2/2  pna- resolving   # EtOH and Tobacco dependence  Plan:  Tolerating full liquid diet- can advance to low residue Continue protonix 40 mg po daily Continue iron infusions  Hold dvt ppx Monitor H&H.  Transfusion and resuscitation as per primary team Avoid frequent lab draws to prevent lab induced anemia Supportive care and antiemetics as per primary team Maintain two sites IV access Avoid nsaids Monitor for GIB.  Discussed with oncoming GI provider - will check patient status tomorrow to assess if further endoscopic evaluation is indicated taking his respiratory status into consideration.  Dr. Allen Norris will be taking over tomorrow.  I personally performed the service.  Management of other medical comorbidities as per primary team  Thank you for allowing [place] to participate in this patient's care. Please don't hesitate to call if any questions or concerns arise.   Annamaria Helling, DO [facility]  Portions of the record may have been created with voice recognition software. Occasional wrong-word or 'sound-a-like' substitutions may have occurred due to the inherent limitations of voice recognition software.  Read the chart carefully and recognize, using context, where substitutions may have occurred.

## 2022-10-30 ENCOUNTER — Encounter: Payer: Self-pay | Admitting: Gastroenterology

## 2022-10-30 DIAGNOSIS — I5031 Acute diastolic (congestive) heart failure: Secondary | ICD-10-CM | POA: Diagnosis not present

## 2022-10-30 DIAGNOSIS — D500 Iron deficiency anemia secondary to blood loss (chronic): Secondary | ICD-10-CM | POA: Diagnosis not present

## 2022-10-30 DIAGNOSIS — J441 Chronic obstructive pulmonary disease with (acute) exacerbation: Secondary | ICD-10-CM | POA: Diagnosis not present

## 2022-10-30 DIAGNOSIS — J189 Pneumonia, unspecified organism: Secondary | ICD-10-CM | POA: Diagnosis not present

## 2022-10-30 DIAGNOSIS — R4189 Other symptoms and signs involving cognitive functions and awareness: Secondary | ICD-10-CM

## 2022-10-30 DIAGNOSIS — D649 Anemia, unspecified: Secondary | ICD-10-CM | POA: Diagnosis not present

## 2022-10-30 LAB — CULTURE, BLOOD (ROUTINE X 2)
Culture: NO GROWTH
Culture: NO GROWTH
Special Requests: ADEQUATE

## 2022-10-30 LAB — CBC WITH DIFFERENTIAL/PLATELET
Abs Immature Granulocytes: 0.19 10*3/uL — ABNORMAL HIGH (ref 0.00–0.07)
Basophils Absolute: 0 10*3/uL (ref 0.0–0.1)
Basophils Relative: 0 %
Eosinophils Absolute: 0 10*3/uL (ref 0.0–0.5)
Eosinophils Relative: 0 %
HCT: 30.5 % — ABNORMAL LOW (ref 39.0–52.0)
Hemoglobin: 8.9 g/dL — ABNORMAL LOW (ref 13.0–17.0)
Immature Granulocytes: 1 %
Lymphocytes Relative: 5 %
Lymphs Abs: 0.9 10*3/uL (ref 0.7–4.0)
MCH: 22.1 pg — ABNORMAL LOW (ref 26.0–34.0)
MCHC: 29.2 g/dL — ABNORMAL LOW (ref 30.0–36.0)
MCV: 75.7 fL — ABNORMAL LOW (ref 80.0–100.0)
Monocytes Absolute: 0.5 10*3/uL (ref 0.1–1.0)
Monocytes Relative: 3 %
Neutro Abs: 14.8 10*3/uL — ABNORMAL HIGH (ref 1.7–7.7)
Neutrophils Relative %: 91 %
Platelets: 439 10*3/uL — ABNORMAL HIGH (ref 150–400)
RBC: 4.03 MIL/uL — ABNORMAL LOW (ref 4.22–5.81)
RDW: 22.6 % — ABNORMAL HIGH (ref 11.5–15.5)
WBC: 16.4 10*3/uL — ABNORMAL HIGH (ref 4.0–10.5)
nRBC: 0 % (ref 0.0–0.2)

## 2022-10-30 LAB — BASIC METABOLIC PANEL
Anion gap: 9 (ref 5–15)
BUN: 12 mg/dL (ref 8–23)
CO2: 27 mmol/L (ref 22–32)
Calcium: 9.5 mg/dL (ref 8.9–10.3)
Chloride: 95 mmol/L — ABNORMAL LOW (ref 98–111)
Creatinine, Ser: 0.87 mg/dL (ref 0.61–1.24)
GFR, Estimated: >60 mL/min (ref >60)
Glucose, Bld: 195 mg/dL — ABNORMAL HIGH (ref 70–99)
Potassium: 4.2 mmol/L (ref 3.5–5.1)
Sodium: 131 mmol/L — ABNORMAL LOW (ref 135–145)

## 2022-10-30 LAB — GLUCOSE, CAPILLARY
Glucose-Capillary: 172 mg/dL — ABNORMAL HIGH (ref 70–99)
Glucose-Capillary: 319 mg/dL — ABNORMAL HIGH (ref 70–99)

## 2022-10-30 LAB — MAGNESIUM: Magnesium: 2.1 mg/dL (ref 1.7–2.4)

## 2022-10-30 SURGERY — COLONOSCOPY WITH PROPOFOL
Anesthesia: Monitor Anesthesia Care

## 2022-10-30 NOTE — TOC Transition Note (Signed)
Transition of Care [facility]) - CM/SW Discharge Note   Patient Details  Name: [name] MRN: [id number] Date of Birth: Apr 19, 1961  Transition of Care [facility]) CM/SW Contact:  Laurena Slimmer, RN Phone Number: 10/30/2022, 2:19 PM   Clinical Narrative:    Arranged Taxi via Ladona Mow.TOC signing off.       Barriers to Discharge: Continued Medical Work up   Patient Goals and CMS Choice   CMS Medicare.gov Compare Post Acute Care list provided to:: Patient Choice offered to / list presented to : Patient  Discharge Placement                       Discharge Plan and Services                                     Social Determinants of Health (SDOH) Interventions Food Insecurity Interventions: Inpatient TOC   Readmission Risk Interventions    10/27/2022   11:28 AM  Readmission Risk Prevention Plan  Transportation Screening Complete  PCP or Specialist Appt within 5-7 Days Complete  Home Care Screening Complete  Medication Review (RN CM) Complete

## 2022-10-30 NOTE — Care Management Important Message (Signed)
Important Message  Patient Details  Name: [name] MRN: [id number] Date of Birth: [birth year]   Medicare Important Message Given:  Yes     Dannette Barbara 10/30/2022, 11:59 AM

## 2022-10-30 NOTE — Progress Notes (Signed)
Nutrition Brief Note  RD pulled to chart secondary to CHF.   Wt Readings from Last 15 Encounters:  10/30/22 109.2 kg  08/14/22 112.9 kg  02/09/22 113.4 kg  06/04/21 112 kg  06/01/20 109.1 kg  01/22/19 100.5 kg  10/18/18 96.5 kg  03/27/18 93 kg  03/01/18 93.9 kg  07/26/16 84.4 kg   Pt with medical history significant for COPD, CVA, HTN, CAD who presented with acute onset of left leg and foot numbness which has been worsening but has been going on for months as well as bilateral lower extremity edema, recent dyspnea with associated nonproductive cough and chest pain.   Pt admitted with symptomatic anemia, COPD, and CHF.   RD provided "Low Sodium Nutrition Therapy" handout from [facility] Nutrition Care Manual; attached to AVS/ discharge summary.   Body mass index is 32.65 kg/m. Patient meets criteria for obesity, class I based on current BMI. Obesity is a complex, chronic medical condition that is optimally managed by a multidisciplinary care team. Weight loss is not an ideal goal for an acute inpatient hospitalization. However, if further work-up for obesity is warranted, consider outpatient referral to outpatient bariatric service and/or [facility] Nutrition [facility] Diabetes Education [facility].    Current diet order is soft, patient is consuming approximately 100% of meals at this time. Labs and medications reviewed.   No nutrition interventions warranted at this time. If nutrition issues arise, please consult RD.   Loistine Chance, RD, LDN, [place] Registered Dietitian II Certified Diabetes Care and Education Specialist Please refer to [facility] for RD and/or RD on-call/weekend/after hours pager

## 2022-10-30 NOTE — Progress Notes (Signed)
[name] , MD [address], [place], [place], [place], [address] [address], [place], [place], [place], [address] Phone: [phone]  Fax: [phone]   [name] is being followed for Melena    Subjective: Clear liquid bowel movements no blood or black color in his stool.  Denies any other symptoms.   Objective: Vital signs in last 24 hours: Vitals:   10/29/22 2140 10/30/22 0500 10/30/22 0554 10/30/22 0751  BP: (!) 140/76  (!) 148/77   Pulse: (!) 104  99   Resp: 18  20   Temp: (!) 97.5 F (36.4 C)  97.8 F (36.6 C)   TempSrc: Oral     SpO2: 100%  99% 99%  Weight:  109.2 kg    Height:       Weight change:   Intake/Output Summary (Last 24 hours) at 10/30/2022 4562 Last data filed at 10/29/2022 2101 Gross per 24 hour  Intake 720 ml  Output 1625 ml  Net -905 ml     Exam: Heart:: Regular rate and rhythm Lungs: normal Abdomen: soft, nontender, normal bowel sounds   Lab Results: '@LABTEST2'$ @ Micro Results: Recent Results (from the past 240 hour(s))  Blood culture (routine x 2)     Status: None   Collection Time: 10/25/22  4:24 AM   Specimen: BLOOD  Result Value Ref Range Status   Specimen Description BLOOD RIGHT FA  Final   Special Requests   Final    BOTTLES DRAWN AEROBIC AND ANAEROBIC Blood Culture adequate volume   Culture   Final    NO GROWTH 5 DAYS Performed at [facility], [address] [place], [place] [address]    Report Status 10/30/2022 FINAL  Final  Resp Panel by RT-PCR (Flu A&B, Covid) Anterior Nasal Swab     Status: None   Collection Time: 10/25/22  4:24 AM   Specimen: Anterior Nasal Swab  Result Value Ref Range Status   SARS Coronavirus 2 by RT PCR NEGATIVE NEGATIVE Final    Comment: (NOTE) SARS-CoV-2 target nucleic acids are NOT DETECTED.  The SARS-CoV-2 RNA is generally detectable in upper respiratory specimens during the acute phase of infection. The lowest concentration of SARS-CoV-2 viral copies this assay  can detect is 138 copies/mL. A negative result does not preclude SARS-Cov-2 infection and should not be used as the sole basis for treatment or other patient management decisions. A negative result may occur with  improper specimen collection/handling, submission of specimen other than nasopharyngeal swab, presence of viral mutation(s) within the areas targeted by this assay, and inadequate number of viral copies(<138 copies/mL). A negative result must be combined with clinical observations, patient history, and epidemiological information. The expected result is Negative.  Fact Sheet for Patients:  EntrepreneurPulse.com.au  Fact Sheet for Healthcare Providers:  IncredibleEmployment.be  This test is no t yet approved or cleared by the [place] FDA and  has been authorized for detection and/or diagnosis of SARS-CoV-2 by FDA under an Emergency Use Authorization (EUA). This EUA will remain  in effect (meaning this test can be used) for the duration of the COVID-19 declaration under Section 564(b)(1) of the Act, 21 U.S.C.section 360bbb-3(b)(1), unless the authorization is terminated  or revoked sooner.       Influenza A by PCR NEGATIVE NEGATIVE Final   Influenza B by PCR NEGATIVE NEGATIVE Final    Comment: (NOTE) The Xpert Xpress SARS-CoV-2/FLU/RSV plus assay is intended as an aid in the diagnosis of influenza from Nasopharyngeal swab specimens  and should not be used as a sole basis for treatment. Nasal washings and aspirates are unacceptable for Xpert Xpress SARS-CoV-2/FLU/RSV testing.  Fact Sheet for Patients: EntrepreneurPulse.com.au  Fact Sheet for Healthcare Providers: IncredibleEmployment.be  This test is not yet approved or cleared by the [place] FDA and has been authorized for detection and/or diagnosis of SARS-CoV-2 by FDA under an Emergency Use Authorization (EUA). This EUA will  remain in effect (meaning this test can be used) for the duration of the COVID-19 declaration under Section 564(b)(1) of the Act, 21 U.S.C. section 360bbb-3(b)(1), unless the authorization is terminated or revoked.  Performed at [facility], [address] [place], [place] [address]   Blood culture (routine x 2)     Status: None   Collection Time: 10/25/22  4:45 AM   Specimen: BLOOD  Result Value Ref Range Status   Specimen Description BLOOD LEFT AC  Final   Special Requests   Final    BOTTLES DRAWN AEROBIC AND ANAEROBIC Blood Culture results may not be optimal due to an excessive volume of blood received in culture bottles   Culture   Final    NO GROWTH 5 DAYS Performed at [facility], [address], [place], [place] [address]    Report Status 10/30/2022 FINAL  Final   Studies/Results: No results found. Medications: I have reviewed the patient's current medications. Scheduled Meds:  sodium chloride   Intravenous Once   budesonide (PULMICORT) nebulizer solution  0.5 mg Nebulization BID   donepezil  10 mg Oral QHS   fluticasone furoate-vilanterol  1 puff Inhalation Daily   furosemide  40 mg Intravenous Daily   guaiFENesin  600 mg Oral BID   influenza vac split quadrivalent PF  0.5 mL Intramuscular Tomorrow-1000   insulin aspart  0-15 Units Subcutaneous TID WC   insulin aspart  0-5 Units Subcutaneous QHS   ipratropium-albuterol  3 mL Nebulization BID   methylPREDNISolone (SOLU-MEDROL) injection  60 mg Intravenous BID   pantoprazole  40 mg Oral Q1200   umeclidinium bromide  1 puff Inhalation Daily   Continuous Infusions:  sodium chloride Stopped (10/25/22 0452)   PRN Meds:.acetaminophen **OR** acetaminophen, chlorpheniramine-HYDROcodone, ipratropium-albuterol, magnesium hydroxide, ondansetron **OR** ondansetron (ZOFRAN) IV, traZODone   Assessment: Principal Problem:   Symptomatic anemia Active Problems:   Cognitive impairment   Acute diastolic  CHF (congestive heart failure) (HCC)   Essential hypertension   GERD without esophagitis   Dyslipidemia   Melena   COPD with acute exacerbation (HCC)   Obesity hypoventilation syndrome ([place])   [name] 61 y.o. male with ahistory of colon polyps , admitted with melena , with microcytic anemia with severe iron deficiency ferritin of 9 , EGD showed atrophic mucosa, hiatal hernia no source of bleeding.  Anemia ongoing for over a year recent colonoscopy in March 2023 showed no abnormalities  Plan: IV iron  IV PPI Monitor CBC and transfuse as needed Outpatient follow-up with me in 7 to 10 days we will arrange for capsule study of the small bowel as an outpatient Continue IV iron as an outpatient and PPI  I will sign off.  Please call me if any further GI concerns or questions.  We would like to thank you for the opportunity to participate in the care of [facility].    LOS: 5 days   Douglas Bellows, MD 10/30/2022, 7:52 AM

## 2022-10-30 NOTE — Plan of Care (Signed)
  Problem: Fluid Volume: Goal: Hemodynamic stability will improve Outcome: Progressing   Problem: Clinical Measurements: Goal: Diagnostic test results will improve Outcome: Progressing   Problem: Respiratory: Goal: Ability to maintain adequate ventilation will improve Outcome: Progressing   Problem: Activity: Goal: Risk for activity intolerance will decrease Outcome: Progressing

## 2022-10-30 NOTE — Progress Notes (Addendum)
   Heart Failure Nurse Navigator Note  With patient today he is lying supine in bed.  No acute distress noted.  He states that he is feeling much better than last week and is possibly being discharged tomorrow.  By teach back method went over daily weights, and what to report.  He needed no reinforcement.  Also discussed low-sodium diet.  He requested he be given some written info about do's and don'ts with the diet.  Also discussed follow-up in the outpatient heart failure clinic, he has an appointment November 17 at 1:30 in the afternoon.  Patient was given heart failure folder, went over dietitary sheet, he states that he could read and understand it.  He had no further questions.  Pricilla Riffle RN CHFN

## 2022-10-30 NOTE — Progress Notes (Signed)
[name] to be D/C'd Home per MD order.  Discussed prescriptions and follow up appointments with the patient. Prescriptions given to patient, medication list explained in detail. Pt verbalized understanding.  Allergies as of 10/30/2022       Reactions   Other    Welbutrin - has paranoia         Medication List     STOP taking these medications    amLODipine 5 MG tablet Commonly known as: NORVASC   aspirin EC 81 MG tablet       TAKE these medications    albuterol 108 (90 Base) MCG/ACT inhaler Commonly known as: VENTOLIN HFA Inhale 2 puffs into the lungs every 4 (four) hours as needed for wheezing or shortness of breath.   cetirizine 10 MG tablet Commonly known as: ZYRTEC Take 10 mg by mouth daily.   cyanocobalamin 1000 MCG tablet Commonly known as: VITAMIN B12 Take 1 tablet (1,000 mcg total) by mouth daily.   donepezil 10 MG tablet Commonly known as: ARICEPT Take 10 mg by mouth daily.   fluticasone 50 MCG/ACT nasal spray Commonly known as: FLONASE Place 2 sprays into both nostrils daily.   Melatonin 10 MG Caps Take 1 capsule by mouth at bedtime.   metoprolol succinate 25 MG 24 hr tablet Commonly known as: TOPROL-XL Take 25 mg by mouth daily.   omeprazole 20 MG capsule Commonly known as: PRILOSEC Take 1 capsule (20 mg total) by mouth 2 (two) times daily before a meal.   pravastatin 20 MG tablet Commonly known as: PRAVACHOL Take 20 mg by mouth at bedtime.   Trelegy Ellipta 100-62.5-25 MCG/ACT Aepb Generic drug: Fluticasone-Umeclidin-Vilant Inhale 1 puff into the lungs daily.        Vitals:   10/30/22 0751 10/30/22 0817  BP:  130/75  Pulse:  (!) 103  Resp:  16  Temp:  97.6 F (36.4 C)  SpO2: 99% 97%    Skin clean, dry and intact without evidence of skin break down, no evidence of skin tears noted. IV catheter discontinued intact. Site without signs and symptoms of complications. Dressing and pressure applied. Pt denies pain at this time.  No complaints noted.  An After Visit Summary was printed and given to the patient. Patient escorted via [place], and D/C home via private auto.  Fuller Mandril, RN

## 2022-10-30 NOTE — Progress Notes (Signed)
   Heart Failure Nurse Navigator Note    With patient briefly today.  He was lying in bed in no acute distress.  Discussed how he is going to take care of himself once he goes home with daily weights, recording, reporting abnormalities and fluid restriction.  He voices understanding.  He was given a scale for completing his daily weights at home.  He had no further questions and we will continue to follow.  Pricilla Riffle RN CHFN

## 2022-10-30 NOTE — Progress Notes (Addendum)
Mobility Specialist - Progress Note  Pre-mobility: SpO2 96% During mobility: SpO2 93% Post-mobility: SPO2 98%   10/30/22 1221  Mobility  Activity Ambulated independently in hallway  Level of Assistance Independent  Assistive Device None  Distance Ambulated (ft) 160 ft  Activity Response Tolerated well  Mobility Referral Yes  $Mobility charge 1 Mobility   Pt sitting EOB upon entry, utilizing RA. Pt indep completed bed mob. Pt ambulated one lap around NS, tolerated well. Pt returned to the room, voiced feeling SOB and stated that's "normal" for him after activity. Pt left sitting EOB with needs within reach, no complaints.   Candie [place] Mobility Specialist 10/30/22 12:24 PM

## 2022-10-30 NOTE — Discharge Summary (Signed)
Physician Discharge Summary   [name] ZOX:[id number] DOB: 1961/07/17 DOA: 10/25/2022  [facility] date: 10/25/2022 Discharge date: 10/30/2022  Barriers to discharge: none  Admitted From: Home Disposition:  Home Discharging physician: Dwyane Dee, MD  Recommendations for Outpatient Follow-up:  Follow up with GI; asa on hold at discharge until follow up  Follow up with cardiology   Home Health:  Equipment/Devices:   Discharge Condition: stable CODE STATUS: Full Diet recommendation:  Diet Orders (From admission, onward)     Start     Ordered   10/30/22 0000  Diet - low sodium heart healthy        10/30/22 1310   10/29/22 1030  DIET SOFT Room service appropriate? Yes; Fluid consistency: Thin  Diet effective now       Question Answer Comment  Room service appropriate? Yes   Fluid consistency: Thin      10/29/22 1029            Hospital Course: [name] is a 61 y.o. Caucasian male with medical history significant for COPD, CVA, HTN, CAD who presented to the ER with acute onset of left leg and foot numbness which has been worsening but has been going on for months as well as bilateral lower extremity edema, recent dyspnea with associated nonproductive cough and chest pain.  He has been occasionally expectorating with his cough.  No fever or chills.  He had 3 vaccine injections for COVID-19.  No nausea or vomiting or diarrhea or abdominal pain.  He has been having urinary urgency without dysuria or hematuria or flank pain.  He admits to dyspnea on exertion and mild orthopnea.   In the ER, vital signs were within normal and later respiratory rate was 23 and later BP was 154/108.  Labs revealed hyponatremia of 131 and CO2 of 18.  IR panel revealed low serum iron and high TIBC with low ferritin and low saturation.  CBC showed leukocytosis at 13.9 with hemoglobin of 6.3 and hematocrit 23.2, and platelets were 473.  INR was 1.3 and PT 15.6.  Stool Hemoccult came back negative. CXR showed pulmonary vascular congestion.  He was ordered 1 unit PRBC and started on lasix as well.  See below for further A&P.   Assessment and Plan: * Symptomatic anemia - LDH normal.  Smear showing unremarkable morphology - Hgb 6.3 g/dL on admission; baseline around 8-9 g/dL - iron stores low as well; will give INFED also, 10/25/22 - s/p PRBC however Hgb still downtrending; increasing concern for underlying GIB - GI consulted for further assistance - last colonoscopy Feb 2023 with 3 polyps removed; negative pathology - continue trending Hgb; transfusing for Hgb <8 g/dL given CHF - EGD performed 10/28/2022; unrevealing for etiology of anemia - plan is for outpatient follow up with GI for possible capsule study and/or colonoscopy  - Hgb stable at discharge   Sepsis due to pneumonia (HCC)-resolved as of 10/27/2022 - some concern for infiltrate in RLL vs congestion - continue diuresis and abx for now; completed 5 days antibiotics  COPD with acute exacerbation ([place]) - noted to have more wheezing post EGD; improved after adding systemic steroids - continue budesonide, duonebs, abx, breo ellipta, and incruse ellipta -Home regimen resumed at discharge  Acute diastolic CHF (congestive heart failure) (HCC) - PND, pulmonary crackles, pulm edema, B/L LE edema, DOE/SOB - recent echo on 10/31: EF 60-65%, Gr II DD. Moderate AS - dilated IVC concerning for RHF due to overload -  diuresed well with lasix  - patient recommended for cardiology outpt follow up already by PCP  Obesity hypoventilation syndrome ([place]) - likely has some hypoxia with sedation and at night/sleeping - has home O2 he uses at baseline  Dyslipidemia - continue statin  GERD without esophagitis - continue PPI  Essential hypertension - Home regimen resumed  Cognitive impairment - continue Aricept.   The patient's chronic medical conditions were treated accordingly per the  patient's home medication regimen except as noted.  On day of discharge, patient was felt deemed stable for discharge. Patient/family member advised to call PCP or come back to ER if needed.   Principal Diagnosis: Symptomatic anemia  Discharge Diagnoses: Active Hospital Problems   Diagnosis Date Noted   Symptomatic anemia 10/25/2022    Priority: 1.   COPD with acute exacerbation ([place]) 10/28/2022    Priority: 3.   Acute diastolic CHF (congestive heart failure) ([place]) 10/25/2022    Priority: 3.   Obesity hypoventilation syndrome ([place]) 10/29/2022    Priority: 4.   Iron deficiency anemia due to chronic blood loss 10/30/2022   Melena 10/26/2022   Essential hypertension 10/25/2022   GERD without esophagitis 10/25/2022   Dyslipidemia 10/25/2022   Cognitive impairment 03/12/2018    Resolved Hospital Problems   Diagnosis Date Noted Date Resolved   Sepsis due to pneumonia [facility]) 10/25/2022 10/27/2022    Priority: 2.     Discharge Instructions     Diet - low sodium heart healthy   Complete by: As directed    Increase activity slowly   Complete by: As directed       Allergies as of 10/30/2022       Reactions   Other    Welbutrin - has paranoia         Medication List     STOP taking these medications    amLODipine 5 MG tablet Commonly known as: NORVASC   aspirin EC 81 MG tablet       TAKE these medications    albuterol 108 (90 Base) MCG/ACT inhaler Commonly known as: VENTOLIN HFA Inhale 2 puffs into the lungs every 4 (four) hours as needed for wheezing or shortness of breath.   cetirizine 10 MG tablet Commonly known as: ZYRTEC Take 10 mg by mouth daily.   cyanocobalamin 1000 MCG tablet Commonly known as: VITAMIN B12 Take 1 tablet (1,000 mcg total) by mouth daily.   donepezil 10 MG tablet Commonly known as: ARICEPT Take 10 mg by mouth daily.   fluticasone 50 MCG/ACT nasal spray Commonly known as: FLONASE Place 2 sprays into both nostrils daily.    Melatonin 10 MG Caps Take 1 capsule by mouth at bedtime.   metoprolol succinate 25 MG 24 hr tablet Commonly known as: TOPROL-XL Take 25 mg by mouth daily.   omeprazole 20 MG capsule Commonly known as: PRILOSEC Take 1 capsule (20 mg total) by mouth 2 (two) times daily before a meal.   pravastatin 20 MG tablet Commonly known as: PRAVACHOL Take 20 mg by mouth at bedtime.   Trelegy Ellipta 100-62.5-25 MCG/ACT Aepb Generic drug: Fluticasone-Umeclidin-Vilant Inhale 1 puff into the lungs daily.        Follow-up Information     Annamaria Helling, DO. Go on 02/13/2023.   Specialty: Gastroenterology Why: Call office if no call received after discharge 9am appointment Contact information: [facility] [place] [address] [phone]                Allergies  Allergen Reactions   Other     Welbutrin - has paranoia     Consultations: GI  Procedures: 11/11: EGD  Discharge Exam: BP 130/75 (BP Location: Right Arm)   Pulse (!) 103   Temp 97.6 F (36.4 C) (Oral)   Resp 16   Ht 6' (1.829 m)   Wt 109.2 kg   SpO2 97%   BMI 32.65 kg/m  Physical Exam Constitutional:      General: He is not in acute distress.    Appearance: Normal appearance.  HENT:     Head: Normocephalic and atraumatic.     Mouth/Throat:     Mouth: Mucous membranes are moist.  Eyes:     Extraocular Movements: Extraocular movements intact.  Cardiovascular:     Rate and Rhythm: Normal rate and regular rhythm.     Heart sounds: Normal heart sounds.  Pulmonary:     Breath sounds: No wheezing.     Comments: Improved sounds overall Abdominal:     General: Bowel sounds are normal. There is distension.     Palpations: Abdomen is soft.     Tenderness: There is no abdominal tenderness.     Comments: Improved distension and more soft  Musculoskeletal:        General: Swelling present. Normal range of motion.     Cervical back: Normal range of motion and neck  supple.     Comments: Edema: Improved from 2+ now trace to ~1+ in B/L LE  Skin:    General: Skin is warm and dry.  Neurological:     General: No focal deficit present.     Mental Status: He is alert.  Psychiatric:        Mood and Affect: Mood normal.        Behavior: Behavior normal.      The results of significant diagnostics from this hospitalization (including imaging, microbiology, ancillary and laboratory) are listed below for reference.   Microbiology: Recent Results (from the past 240 hour(s))  Blood culture (routine x 2)     Status: None   Collection Time: 10/25/22  4:24 AM   Specimen: BLOOD  Result Value Ref Range Status   Specimen Description BLOOD RIGHT FA  Final   Special Requests   Final    BOTTLES DRAWN AEROBIC AND ANAEROBIC Blood Culture adequate volume   Culture   Final    NO GROWTH 5 DAYS Performed at [facility], [address], [place], [place] [address]    Report Status 10/30/2022 FINAL  Final  Resp Panel by RT-PCR (Flu A&B, Covid) Anterior Nasal Swab     Status: None   Collection Time: 10/25/22  4:24 AM   Specimen: Anterior Nasal Swab  Result Value Ref Range Status   SARS Coronavirus 2 by RT PCR NEGATIVE NEGATIVE Final    Comment: (NOTE) SARS-CoV-2 target nucleic acids are NOT DETECTED.  The SARS-CoV-2 RNA is generally detectable in upper respiratory specimens during the acute phase of infection. The lowest concentration of SARS-CoV-2 viral copies this assay can detect is 138 copies/mL. A negative result does not preclude SARS-Cov-2 infection and should not be used as the sole basis for treatment or other patient management decisions. A negative result may occur with  improper specimen collection/handling, submission of specimen other than nasopharyngeal swab, presence of viral mutation(s) within the areas targeted by this assay, and inadequate number of viral copies(<138 copies/mL). A negative result must be combined with clinical  observations, patient history, and epidemiological information.  The expected result is Negative.  Fact Sheet for Patients:  EntrepreneurPulse.com.au  Fact Sheet for Healthcare Providers:  IncredibleEmployment.be  This test is no t yet approved or cleared by the [place] FDA and  has been authorized for detection and/or diagnosis of SARS-CoV-2 by FDA under an Emergency Use Authorization (EUA). This EUA will remain  in effect (meaning this test can be used) for the duration of the COVID-19 declaration under Section 564(b)(1) of the Act, 21 U.S.C.section 360bbb-3(b)(1), unless the authorization is terminated  or revoked sooner.       Influenza A by PCR NEGATIVE NEGATIVE Final   Influenza B by PCR NEGATIVE NEGATIVE Final    Comment: (NOTE) The Xpert Xpress SARS-CoV-2/FLU/RSV plus assay is intended as an aid in the diagnosis of influenza from Nasopharyngeal swab specimens and should not be used as a sole basis for treatment. Nasal washings and aspirates are unacceptable for Xpert Xpress SARS-CoV-2/FLU/RSV testing.  Fact Sheet for Patients: EntrepreneurPulse.com.au  Fact Sheet for Healthcare Providers: IncredibleEmployment.be  This test is not yet approved or cleared by the [place] FDA and has been authorized for detection and/or diagnosis of SARS-CoV-2 by FDA under an Emergency Use Authorization (EUA). This EUA will remain in effect (meaning this test can be used) for the duration of the COVID-19 declaration under Section 564(b)(1) of the Act, 21 U.S.C. section 360bbb-3(b)(1), unless the authorization is terminated or revoked.  Performed at [facility], [address] [place], [place] [address]   Blood culture (routine x 2)     Status: None   Collection Time: 10/25/22  4:45 AM   Specimen: BLOOD  Result Value Ref Range Status   Specimen Description BLOOD LEFT AC  Final   Special  Requests   Final    BOTTLES DRAWN AEROBIC AND ANAEROBIC Blood Culture results may not be optimal due to an excessive volume of blood received in culture bottles   Culture   Final    NO GROWTH 5 DAYS Performed at [facility], [address] [place],  [address]    Report Status 10/30/2022 FINAL  Final     Labs: BNP (last 3 results) Recent Labs    10/25/22 0228  BNP 324.4*   Basic Metabolic Panel: Recent Labs  Lab 10/26/22 0521 10/27/22 0403 10/28/22 0440 10/29/22 0430 10/30/22 0420  NA 135 138 137 135 131*  K 3.1* 3.2* 3.4* 4.1 4.2  CL 98 101 99 100 95*  CO2 '28 30 30 28 27  '$ GLUCOSE 114* 91 94 169* 195*  BUN 12 8 5* 8 12  CREATININE 0.93 0.91 0.95 0.85 0.87  CALCIUM 8.2* 8.4* 8.6* 9.0 9.5  MG 2.0 2.1 2.0 2.1 2.1   Liver Function Tests: No results for input(s): "AST", "ALT", "ALKPHOS", "BILITOT", "PROT", "ALBUMIN" in the last 168 hours. No results for input(s): "LIPASE", "AMYLASE" in the last 168 hours. No results for input(s): "AMMONIA" in the last 168 hours. CBC: Recent Labs  Lab 10/26/22 0521 10/26/22 1605 10/27/22 0403 10/28/22 0440 10/29/22 0430 10/30/22 0420  WBC 13.5*  --  13.7* 12.1* 7.3 16.4*  NEUTROABS 9.0*  --  8.7* 7.5 6.5 14.8*  HGB 7.0* 8.3* 7.9* 8.2* 9.2* 8.9*  HCT 23.8* 27.3* 26.6* 28.6* 32.0* 30.5*  MCV 72.1*  --  72.5* 74.1* 75.8* 75.7*  PLT 380  --  404* 419* 441* 439*   Cardiac Enzymes: No results for input(s): "CKTOTAL", "CKMB", "CKMBINDEX", "TROPONINI" in the last 168 hours. BNP: Invalid input(s): "POCBNP" CBG:  Recent Labs  Lab 10/29/22 1135 10/29/22 1751 10/29/22 2024 10/30/22 0813 10/30/22 1150  GLUCAP 175* 195* 210* 172* 319*   D-Dimer No results for input(s): "DDIMER" in the last 72 hours. Hgb A1c Recent Labs    10/28/22 0437  HGBA1C 5.3   Lipid Profile No results for input(s): "CHOL", "HDL", "LDLCALC", "TRIG", "CHOLHDL", "LDLDIRECT" in the last 72 hours. Thyroid function studies No results for  input(s): "TSH", "T4TOTAL", "T3FREE", "THYROIDAB" in the last 72 hours.  Invalid input(s): "FREET3" Anemia work up No results for input(s): "VITAMINB12", "FOLATE", "FERRITIN", "TIBC", "IRON", "RETICCTPCT" in the last 72 hours. Urinalysis    Component Value Date/Time   COLORURINE YELLOW (A) 06/03/2021 2055   APPEARANCEUR CLOUDY (A) 06/03/2021 2055   APPEARANCEUR Clear 09/09/2014 1708   LABSPEC 1.012 06/03/2021 2055   LABSPEC 1.011 09/09/2014 1708   PHURINE 6.0 06/03/2021 2055   GLUCOSEU 50 (A) 06/03/2021 2055   GLUCOSEU Negative 09/09/2014 1708   HGBUR SMALL (A) 06/03/2021 2055   BILIRUBINUR NEGATIVE 06/03/2021 2055   BILIRUBINUR Negative 09/09/2014 1708   KETONESUR 5 (A) 06/03/2021 2055   PROTEINUR 100 (A) 06/03/2021 2055   NITRITE NEGATIVE 06/03/2021 2055   LEUKOCYTESUR NEGATIVE 06/03/2021 2055   LEUKOCYTESUR Negative 09/09/2014 1708   Sepsis Labs Recent Labs  Lab 10/27/22 0403 10/28/22 0440 10/29/22 0430 10/30/22 0420  WBC 13.7* 12.1* 7.3 16.4*   Microbiology Recent Results (from the past 240 hour(s))  Blood culture (routine x 2)     Status: None   Collection Time: 10/25/22  4:24 AM   Specimen: BLOOD  Result Value Ref Range Status   Specimen Description BLOOD RIGHT FA  Final   Special Requests   Final    BOTTLES DRAWN AEROBIC AND ANAEROBIC Blood Culture adequate volume   Culture   Final    NO GROWTH 5 DAYS Performed at [facility], [address], [place], [place] [address]    Report Status 10/30/2022 FINAL  Final  Resp Panel by RT-PCR (Flu A&B, Covid) Anterior Nasal Swab     Status: None   Collection Time: 10/25/22  4:24 AM   Specimen: Anterior Nasal Swab  Result Value Ref Range Status   SARS Coronavirus 2 by RT PCR NEGATIVE NEGATIVE Final    Comment: (NOTE) SARS-CoV-2 target nucleic acids are NOT DETECTED.  The SARS-CoV-2 RNA is generally detectable in upper respiratory specimens during the acute phase of infection. The lowest concentration of  SARS-CoV-2 viral copies this assay can detect is 138 copies/mL. A negative result does not preclude SARS-Cov-2 infection and should not be used as the sole basis for treatment or other patient management decisions. A negative result may occur with  improper specimen collection/handling, submission of specimen other than nasopharyngeal swab, presence of viral mutation(s) within the areas targeted by this assay, and inadequate number of viral copies(<138 copies/mL). A negative result must be combined with clinical observations, patient history, and epidemiological information. The expected result is Negative.  Fact Sheet for Patients:  EntrepreneurPulse.com.au  Fact Sheet for Healthcare Providers:  IncredibleEmployment.be  This test is no t yet approved or cleared by the [place] FDA and  has been authorized for detection and/or diagnosis of SARS-CoV-2 by FDA under an Emergency Use Authorization (EUA). This EUA will remain  in effect (meaning this test can be used) for the duration of the COVID-19 declaration under Section 564(b)(1) of the Act, 21 U.S.C.section 360bbb-3(b)(1), unless the authorization is terminated  or revoked sooner.  Influenza A by PCR NEGATIVE NEGATIVE Final   Influenza B by PCR NEGATIVE NEGATIVE Final    Comment: (NOTE) The Xpert Xpress SARS-CoV-2/FLU/RSV plus assay is intended as an aid in the diagnosis of influenza from Nasopharyngeal swab specimens and should not be used as a sole basis for treatment. Nasal washings and aspirates are unacceptable for Xpert Xpress SARS-CoV-2/FLU/RSV testing.  Fact Sheet for Patients: EntrepreneurPulse.com.au  Fact Sheet for Healthcare Providers: IncredibleEmployment.be  This test is not yet approved or cleared by the [place] FDA and has been authorized for detection and/or diagnosis of SARS-CoV-2 by FDA under an Emergency Use  Authorization (EUA). This EUA will remain in effect (meaning this test can be used) for the duration of the COVID-19 declaration under Section 564(b)(1) of the Act, 21 U.S.C. section 360bbb-3(b)(1), unless the authorization is terminated or revoked.  Performed at [facility], [address] [place], [place] [address]   Blood culture (routine x 2)     Status: None   Collection Time: 10/25/22  4:45 AM   Specimen: BLOOD  Result Value Ref Range Status   Specimen Description BLOOD LEFT AC  Final   Special Requests   Final    BOTTLES DRAWN AEROBIC AND ANAEROBIC Blood Culture results may not be optimal due to an excessive volume of blood received in culture bottles   Culture   Final    NO GROWTH 5 DAYS Performed at [facility], [address] [place], [place] [address]    Report Status 10/30/2022 FINAL  Final    Procedures/Studies: DG Chest 1 View  Result Date: 10/25/2022 CLINICAL DATA:  Bilateral feet swelling; shortness of breath EXAM: CHEST  1 VIEW COMPARISON:  CT chest 10/19/2022 and radiographs 06/03/2021 FINDINGS: Sternotomy and CABG. Stable cardiomegaly. Pulmonary vascular congestion. Small right pleural effusion and associated atelectasis/consolidation. Hyperinflation. No pneumothorax. No acute osseous abnormality. IMPRESSION: Small right pleural effusion and associated atelectasis or consolidation. Pneumonia is difficult to exclude. COPD. Cardiomegaly and pulmonary vascular congestion. Electronically Signed   By: Placido Sou M.D.   On: 10/25/2022 03:06   US ABDOMEN LIMITED RUQ (LIVER/GB)  Result Date: 10/21/2022 CLINICAL DATA:  ETOH use EXAM: ULTRASOUND ABDOMEN LIMITED RIGHT UPPER QUADRANT COMPARISON:  Ultrasound 07/24/2016 FINDINGS: Gallbladder: Surgically absent Common bile duct: Diameter: 3 mm Liver: Echogenic liver parenchyma without focal hepatic abnormality. Small volume perihepatic ascites portal vein is patent on color Doppler imaging with normal  direction of blood flow towards the liver. Other: None. IMPRESSION: Status post cholecystectomy. Echogenic liver parenchyma suggesting hepatic steatosis and/or hepatocellular disease. Small volume perihepatic ascites. Electronically Signed   By: Donavan Foil M.D.   On: 10/21/2022 17:28   CT CHEST WO CONTRAST  Result Date: 10/21/2022 CLINICAL DATA:  Dyspnea of unclear etiology EXAM: CT CHEST WITHOUT CONTRAST TECHNIQUE: Multidetector CT imaging of the chest was performed following the standard protocol without IV contrast. RADIATION DOSE REDUCTION: This exam was performed according to the departmental dose-optimization program which includes automated exposure control, adjustment of the mA and/or kV according to patient size and/or use of iterative reconstruction technique. COMPARISON:  Chest x-ray 06/03/2021 FINDINGS: Cardiovascular: Limited evaluation without intravenous contrast. Moderate aortic atherosclerosis. No aneurysm. Coronary vascular calcifications. Post CABG changes. Aortic valvular calcifications. Normal cardiac size. No pericardial effusion Mediastinum/Nodes: Midline trachea. No thyroid mass. No suspicious lymph nodes. Esophagus within normal limits. Lungs/Pleura: Moderate emphysema. Small moderate right pleural effusion with mild passive atelectasis at the right base. Minimal scarring or atelectasis at the right upper lobe.  Mild apical scarring. Upper Abdomen: Small volume perihepatic ascites. No acute abnormality. Musculoskeletal: Post sternotomy changes. Minimal superior endplate deformity T4 and T6, probably chronic IMPRESSION: 1. Moderate emphysema. Small to moderate right pleural effusion with mild passive atelectasis at the right base. Small volume perihepatic ascites. 2. Aortic atherosclerosis. Coronary vascular disease post CABG. Aortic valvular calcification. Aortic Atherosclerosis (ICD10-I70.0) and Emphysema (ICD10-J43.9). Electronically Signed   By: Donavan Foil M.D.   On: 10/21/2022  17:27   US Venous Img Lower Bilateral (DVT)  Result Date: 10/19/2022 CLINICAL DATA:  Bilateral lower extremity edema for the past 4 months. Evaluate for DVT. EXAM: BILATERAL LOWER EXTREMITY VENOUS DOPPLER ULTRASOUND TECHNIQUE: Gray-scale sonography with graded compression, as well as color Doppler and duplex ultrasound were performed to evaluate the lower extremity deep venous systems from the level of the common femoral vein and including the common femoral, femoral, profunda femoral, popliteal and calf veins including the posterior tibial, peroneal and gastrocnemius veins when visible. The superficial great saphenous vein was also interrogated. Spectral Doppler was utilized to evaluate flow at rest and with distal augmentation maneuvers in the common femoral, femoral and popliteal veins. COMPARISON:  None Available. FINDINGS: RIGHT LOWER EXTREMITY Common Femoral Vein: No evidence of thrombus. Normal compressibility, respiratory phasicity and response to augmentation. Saphenofemoral Junction: No evidence of thrombus. Normal compressibility and flow on color Doppler imaging. Profunda Femoral Vein: No evidence of thrombus. Normal compressibility and flow on color Doppler imaging. Femoral Vein: No evidence of thrombus. Normal compressibility, respiratory phasicity and response to augmentation. Popliteal Vein: No evidence of thrombus. Normal compressibility, respiratory phasicity and response to augmentation. Calf Veins: No evidence of thrombus. Normal compressibility and flow on color Doppler imaging. Superficial Great Saphenous Vein: No evidence of thrombus. Normal compressibility. Other Findings:  None. LEFT LOWER EXTREMITY Common Femoral Vein: No evidence of thrombus. Normal compressibility, respiratory phasicity and response to augmentation. Saphenofemoral Junction: No evidence of thrombus. Normal compressibility and flow on color Doppler imaging. Profunda Femoral Vein: No evidence of thrombus. Normal  compressibility and flow on color Doppler imaging. Femoral Vein: No evidence of thrombus. Normal compressibility, respiratory phasicity and response to augmentation. Popliteal Vein: No evidence of thrombus. Normal compressibility, respiratory phasicity and response to augmentation. Calf Veins: No evidence of thrombus. Normal compressibility and flow on color Doppler imaging. Superficial Great Saphenous Vein: No evidence of thrombus. Normal compressibility. Other Findings:  None. IMPRESSION: No evidence of DVT within either lower extremity. Electronically Signed   By: Sandi Mariscal M.D.   On: 10/19/2022 10:21   ECHOCARDIOGRAM COMPLETE BUBBLE STUDY  Result Date: 10/17/2022    ECHOCARDIOGRAM REPORT   Patient Name:   Kam Buccellato Date of Exam: 10/17/2022 Medical Rec #:  [id number]    Height:       72.0 in Accession #:    [id number]   Weight:       249.0 lb Date of Birth:  12-22-1960     BSA:          2.339 m Patient Age:    11 years     BP:           140/78 mmHg Patient Gender: M            HR:           91 bpm. Exam Location:  [facility] Procedure: 2D Echo, Color Doppler, Cardiac Doppler and Saline Contrast Bubble            Study Indications:     R06.09 Dyspnea on  exertion; J44.9 Chronic obstructive pulmonary                  disease, unspecified COPD type  History:         Patient has prior history of Echocardiogram examinations, most                  recent 06/01/2020. Prior CABG; COPD and Stroke.  Sonographer:     Charmayne Sheer Referring Phys:  [id number] KHABIB DGAYLI Diagnosing Phys: Nelva Bush MD IMPRESSIONS  1. Left ventricular ejection fraction, by estimation, is 60 to 65%. The left ventricle has normal function. Left ventricular endocardial border not optimally defined to evaluate regional wall motion. Left ventricular diastolic parameters are consistent with Grade II diastolic dysfunction (pseudonormalization). Elevated left atrial pressure.  2. Right ventricular systolic function is mildly reduced. The right  ventricular size is mildly enlarged.  3. The mitral valve was not well visualized. No evidence of mitral valve regurgitation. Mild mitral stenosis. Moderate to severe mitral annular calcification.  4. The aortic valve was not well visualized. There is severe calcifcation of the aortic valve. There is severe thickening of the aortic valve. Aortic valve regurgitation is not visualized. Moderate aortic valve stenosis. Aortic valve mean gradient measures 28.0 mmHg. Aortic valve Vmax measures 3.59 m/s.  5. The inferior vena cava is dilated in size with <50% respiratory variability, suggesting right atrial pressure of 15 mmHg.  6. Agitated saline contrast bubble study was negative, with no evidence of any interatrial shunt. FINDINGS  Left Ventricle: Left ventricular ejection fraction, by estimation, is 60 to 65%. The left ventricle has normal function. Left ventricular endocardial border not optimally defined to evaluate regional wall motion. The left ventricular internal cavity size was normal in size. There is borderline left ventricular hypertrophy. Left ventricular diastolic parameters are consistent with Grade II diastolic dysfunction (pseudonormalization). Elevated left atrial pressure. Right Ventricle: The right ventricular size is mildly enlarged. No increase in right ventricular wall thickness. Right ventricular systolic function is mildly reduced. Left Atrium: Left atrial size was normal in size. Right Atrium: Right atrial size was normal in size. Pericardium: There is no evidence of pericardial effusion. Mitral Valve: The mitral valve was not well visualized. Moderate to severe mitral annular calcification. No evidence of mitral valve regurgitation. Mild mitral valve stenosis. The mean mitral valve gradient is 5.6 mmHg. Tricuspid Valve: The tricuspid valve is not well visualized. Tricuspid valve regurgitation is not demonstrated. Aortic Valve: The aortic valve was not well visualized. There is severe  calcifcation of the aortic valve. There is severe thickening of the aortic valve. Aortic valve regurgitation is not visualized. Moderate aortic stenosis is present. Aortic valve mean  gradient measures 28.0 mmHg. Aortic valve peak gradient measures 51.6 mmHg. Aortic valve area, by VTI measures 1.64 cm. Pulmonic Valve: The pulmonic valve was not well visualized. Pulmonic valve regurgitation is not visualized. No evidence of pulmonic stenosis. Aorta: The aortic root is normal in size and structure. Pulmonary Artery: The pulmonary artery is of normal size. Venous: The inferior vena cava is dilated in size with less than 50% respiratory variability, suggesting right atrial pressure of 15 mmHg. IAS/Shunts: The interatrial septum was not well visualized. Agitated saline contrast was given intravenously to evaluate for intracardiac shunting. Agitated saline contrast bubble study was negative, with no evidence of any interatrial shunt.  LEFT VENTRICLE PLAX 2D LVIDd:         5.50 cm   Diastology LVIDs:  3.10 cm   LV e' medial:    4.79 cm/s LV PW:         1.14 cm   LV E/e' medial:  38.2 LV IVS:        1.05 cm   LV e' lateral:   12.70 cm/s LVOT diam:     2.40 cm   LV E/e' lateral: 14.4 LV SV:         99 LV SV Index:   42 LVOT Area:     4.52 cm  RIGHT VENTRICLE RV Basal diam:  4.50 cm RV S prime:     5.00 cm/s TAPSE (M-mode): 1.7 cm LEFT ATRIUM             Index        RIGHT ATRIUM           Index LA diam:        4.70 cm 2.01 cm/m   RA Area:     19.00 cm LA Vol (A2C):   47.4 ml 20.27 ml/m  RA Volume:   58.50 ml  25.02 ml/m LA Vol (A4C):   38.4 ml 16.42 ml/m LA Biplane Vol: 43.6 ml 18.64 ml/m  AORTIC VALVE                     PULMONIC VALVE AV Area (Vmax):    1.32 cm      PV Vmax:       1.86 m/s AV Area (Vmean):   1.44 cm      PV Vmean:      128.000 cm/s AV Area (VTI):     1.64 cm      PV VTI:        0.366 m AV Vmax:           359.00 cm/s   PV Peak grad:  13.8 mmHg AV Vmean:          229.500 cm/s  PV Mean grad:   8.0 mmHg AV VTI:            0.605 m AV Peak Grad:      51.6 mmHg AV Mean Grad:      28.0 mmHg LVOT Vmax:         105.00 cm/s LVOT Vmean:        72.800 cm/s LVOT VTI:          0.219 m LVOT/AV VTI ratio: 0.36  AORTA Ao Root diam: 3.50 cm MITRAL VALVE MV Area (PHT): 4.29 cm     SHUNTS MV Mean grad:  5.6 mmHg     Systemic VTI:  0.22 m MV Decel Time: 177 msec     Systemic Diam: 2.40 cm MV E velocity: 183.00 cm/s MV A velocity: 154.00 cm/s MV E/A ratio:  1.19 Nelva Bush MD Electronically signed by Nelva Bush MD Signature Date/Time: 10/17/2022/2:52:52 PM    Final      Time coordinating discharge: Over 30 minutes    Dwyane Dee, MD  [facility] 10/30/2022, 2:57 PM

## 2022-10-30 NOTE — Inpatient Diabetes Management (Signed)
Inpatient Diabetes Program Recommendations  AACE/ADA: New Consensus Statement on Inpatient Glycemic Control   Target Ranges:  Prepandial:   less than 140 mg/dL      Peak postprandial:   less than 180 mg/dL (1-2 hours)      Critically ill patients:  140 - 180 mg/dL    Latest Reference Range & Units 10/30/22 04:20  Glucose 70 - 99 mg/dL 195 (H)    Latest Reference Range & Units 10/29/22 07:31 10/29/22 11:35 10/29/22 17:51 10/29/22 20:24  Glucose-Capillary 70 - 99 mg/dL 234 (H) 175 (H) 195 (H) 210 (H)    Review of Glycemic Control  Diabetes history: No Outpatient Diabetes medications: None Current orders for Inpatient glycemic control: Novolog 0-15 units TID with meals, Novolog 0-5 units QHS; Solumedrol 60 mg BID  Inpatient Diabetes Program Recommendations:    Insulin: If steroids are continued, please consider ordering Novolog 2 units TID with meals for meal coverage if patient eats at least 50% of meals.  Thanks, Barnie Alderman, RN, MSN, [place] [facility] Coordinator Inpatient Diabetes Program [phone] (Team Pager from 8am to [place])

## 2022-10-31 LAB — SURGICAL PATHOLOGY

## 2022-11-01 ENCOUNTER — Ambulatory Visit: Payer: Medicare Other | Admitting: Student in an Organized Health Care Education/Training Program

## 2022-11-02 NOTE — Progress Notes (Deleted)
   Patient ID: [name], male    DOB: 15-Apr-1961, 61 y.o.   MRN: [id number]  HPI  Mr Glidden is a 61 y/o male with a history of  Echo report from 10/17/22 reviewed and showed an EF of 60-65% along with mild MS and moderate AS.   Admitted 10/25/22 due to acute onset of left leg and foot numbness which has been worsening but has been going on for months as well as bilateral lower extremity edema, recent dyspnea with associated nonproductive cough and chest pain. CXR shows congestion. Given 1 u PRBC's and lasix given. GI consult obtained. EGD done. Given antibiotics for pneumonia. Discharged after 5 days.   He presents today for his initial visit with a chief complaint of   Review of Systems    Physical Exam    Assessment & Plan:  1: Chronic heart failure with preserved ejection fraction- - NYHA class - saw cardiology Nehemiah Massed) 12/08/21 - BNP 10/25/22 was 702.2  2: HTN- - BP - BMP 10/30/22 reviewed and showed sodium 131, potassium 4.2, creatinine 0.87& GFR >60  3: COPD- - saw pulmonology (Dgayli) 08/14/22  4: Anemia- - hemoglobin 10/30/22 was 8.9

## 2022-11-03 ENCOUNTER — Ambulatory Visit: Payer: Medicare Other | Admitting: Family

## 2022-11-03 ENCOUNTER — Telehealth: Payer: Self-pay | Admitting: Family

## 2022-11-03 NOTE — Telephone Encounter (Signed)
Patient did not show for his initial [facility] Failure [facility] appointment on 11/03/22. Will attempt to reschedule.

## 2023-01-23 ENCOUNTER — Ambulatory Visit (LOCAL_COMMUNITY_HEALTH_CENTER): Payer: 59

## 2023-01-23 DIAGNOSIS — Z111 Encounter for screening for respiratory tuberculosis: Secondary | ICD-10-CM

## 2023-01-23 NOTE — Progress Notes (Signed)
Pt is here for PPD placement as needed for placement into [facility]. He explains that [facility] is his DSS caseworker and arranged appt today. Currently living in [place]. PPDR reminder for 01/26/2023 given. Josie Saunders, RN

## 2023-01-26 ENCOUNTER — Ambulatory Visit (LOCAL_COMMUNITY_HEALTH_CENTER): Payer: 59

## 2023-01-26 DIAGNOSIS — Z111 Encounter for screening for respiratory tuberculosis: Secondary | ICD-10-CM

## 2023-01-26 LAB — TB SKIN TEST
Induration: 0 mm
TB Skin Test: NEGATIVE

## 2023-03-13 ENCOUNTER — Other Ambulatory Visit: Payer: Self-pay

## 2023-03-13 ENCOUNTER — Encounter: Payer: Self-pay | Admitting: Intensive Care

## 2023-03-13 ENCOUNTER — Emergency Department: Payer: 59

## 2023-03-13 ENCOUNTER — Emergency Department
Admission: EM | Admit: 2023-03-13 | Discharge: 2023-03-13 | Disposition: A | Discharge status: Home / Self Care | Payer: 59 | Attending: Student in an Organized Health Care Education/Training Program | Admitting: Student in an Organized Health Care Education/Training Program

## 2023-03-13 DIAGNOSIS — R0789 Other chest pain: Secondary | ICD-10-CM | POA: Insufficient documentation

## 2023-03-13 DIAGNOSIS — R079 Chest pain, unspecified: Secondary | ICD-10-CM | POA: Diagnosis present

## 2023-03-13 DIAGNOSIS — R0602 Shortness of breath: Secondary | ICD-10-CM | POA: Insufficient documentation

## 2023-03-13 DIAGNOSIS — J449 Chronic obstructive pulmonary disease, unspecified: Secondary | ICD-10-CM | POA: Diagnosis not present

## 2023-03-13 LAB — BASIC METABOLIC PANEL
Anion gap: 17 — ABNORMAL HIGH (ref 5–15)
BUN: 10 mg/dL (ref 8–23)
CO2: 25 mmol/L (ref 22–32)
Calcium: 9.4 mg/dL (ref 8.9–10.3)
Chloride: 91 mmol/L — ABNORMAL LOW (ref 98–111)
Creatinine, Ser: 0.79 mg/dL (ref 0.61–1.24)
GFR, Estimated: >60 mL/min (ref >60)
Glucose, Bld: 121 mg/dL — ABNORMAL HIGH (ref 70–99)
Potassium: 4.3 mmol/L (ref 3.5–5.1)
Sodium: 133 mmol/L — ABNORMAL LOW (ref 135–145)

## 2023-03-13 LAB — CBC
HCT: 40.1 % (ref 39.0–52.0)
Hemoglobin: 12.5 g/dL — ABNORMAL LOW (ref 13.0–17.0)
MCH: 25.6 pg — ABNORMAL LOW (ref 26.0–34.0)
MCHC: 31.2 g/dL (ref 30.0–36.0)
MCV: 82 fL (ref 80.0–100.0)
Platelets: 219 10*3/uL (ref 150–400)
RBC: 4.89 MIL/uL (ref 4.22–5.81)
RDW: 15.9 % — ABNORMAL HIGH (ref 11.5–15.5)
WBC: 7.6 10*3/uL (ref 4.0–10.5)
nRBC: 0 % (ref 0.0–0.2)

## 2023-03-13 LAB — TROPONIN I (HIGH SENSITIVITY)
Troponin I (High Sensitivity): 13 ng/L (ref <18)
Troponin I (High Sensitivity): 11 ng/L (ref <18)

## 2023-03-13 LAB — TYPE AND SCREEN
ABO/RH(D): A POS
Antibody Screen: NEGATIVE

## 2023-03-13 LAB — D-DIMER, QUANTITATIVE: D-Dimer, Quant: 0.54 ug/mL-FEU — ABNORMAL HIGH (ref 0.00–0.50)

## 2023-03-13 MED ORDER — IOHEXOL 350 MG/ML SOLN
75.0000 mL | Freq: Once | INTRAVENOUS | Status: AC | PRN
  Administered 2023-03-13: 75 mL via INTRAVENOUS

## 2023-03-13 MED ORDER — PREDNISONE 20 MG PO TABS: 40.0000 mg | ORAL_TABLET | Freq: Every day | ORAL | 0 refills | Status: AC

## 2023-03-13 MED ORDER — ALBUTEROL SULFATE HFA 108 (90 BASE) MCG/ACT IN AERS: 2.0000 | INHALATION_SPRAY | Freq: Four times a day (QID) | RESPIRATORY_TRACT | 2 refills | Status: DC | PRN

## 2023-03-13 MED ORDER — IPRATROPIUM-ALBUTEROL 0.5-2.5 (3) MG/3ML IN SOLN
3.0000 mL | Freq: Once | RESPIRATORY_TRACT | Status: AC
  Administered 2023-03-13: 3 mL via RESPIRATORY_TRACT
  Filled 2023-03-13: qty 3

## 2023-03-13 MED ORDER — PREDNISONE 20 MG PO TABS
40.0000 mg | ORAL_TABLET | Freq: Once | ORAL | Status: AC
  Administered 2023-03-13: 40 mg via ORAL
  Filled 2023-03-13: qty 2

## 2023-03-13 NOTE — ED Triage Notes (Signed)
First nurse note: Patient arrived by EMS from [place] for sob. Worsening with exertion. Able to speak in full sentences  A&O x4 upon arrival  History a-fib, COPD, and MI with stents placed  EMS vitals: 147/69 b/p 110 HR

## 2023-03-13 NOTE — ED Provider Notes (Signed)
[facility] Provider Note    Event Date/Time   First MD Initiated Contact with Patient 03/13/23 1313     (approximate)   History   Chest Pain, Shortness of Breath  HPI  [name] is a 62 y.o. male to ER for evaluation of chest pain shortness of breath over the past 2 to 3 days.  Does have some pain with deep inspiration no fevers or chills.  No diaphoresis.  Has had some intermittent cough.  Does not wear home oxygen.  Symptoms are worsened with exertion.  Triage reports his chief complaint of rectal bleeding but patient denies this no melena no hematochezia no abdominal pain.    Physical Exam   Triage Vital Signs: ED Triage Vitals  Enc Vitals Group     BP 03/13/23 1255 138/70     Pulse Rate 03/13/23 1255 93     Resp 03/13/23 1255 (!) 22     Temp 03/13/23 1255 97.7 F (36.5 C)     Temp Source 03/13/23 1255 Oral     SpO2 03/13/23 1255 97 %     Weight 03/13/23 1256 220 lb (99.8 kg)     Height 03/13/23 1256 6' (1.829 m)     Head Circumference --      Peak Flow --      Pain Score 03/13/23 1256 6     Pain Loc --      Pain Edu? --      Excl. in [place]? --     Most recent vital signs: Vitals:   03/13/23 1255  BP: 138/70  Pulse: 93  Resp: (!) 22  Temp: 97.7 F (36.5 C)  SpO2: 97%     Constitutional: Alert  Eyes: Conjunctivae are normal.  Head: Atraumatic. Nose: No congestion/rhinnorhea. Mouth/Throat: Mucous membranes are moist.   Neck: Painless ROM.  Cardiovascular:   Good peripheral circulation. Respiratory: Normal respiratory effort.  No retractions.  Coarse breath sounds throughout. Gastrointestinal: Soft and nontender.  Musculoskeletal:  no deformity Neurologic:  MAE spontaneously. No gross focal neurologic deficits are appreciated.  Skin:  Skin is warm, dry and intact. No rash noted. Psychiatric: Mood and affect are normal. Speech and behavior are normal.    ED Results / Procedures / Treatments   Labs (all labs ordered are  listed, but only abnormal results are displayed) Labs Reviewed  BASIC METABOLIC PANEL - Abnormal; Notable for the following components:      Result Value   Sodium 133 (*)    Chloride 91 (*)    Glucose, Bld 121 (*)    Anion gap 17 (*)    All other components within normal limits  CBC - Abnormal; Notable for the following components:   Hemoglobin 12.5 (*)    MCH 25.6 (*)    RDW 15.9 (*)    All other components within normal limits  D-DIMER, QUANTITATIVE - Abnormal; Notable for the following components:   D-Dimer, Quant 0.54 (*)    All other components within normal limits  TYPE AND SCREEN  TROPONIN I (HIGH SENSITIVITY)  TROPONIN I (HIGH SENSITIVITY)     EKG  ED ECG REPORT I, Merlyn Lot, the attending physician, personally viewed and interpreted this ECG.   Date: 03/13/2023  EKG Time: 12:50  Rate: 120  Rhythm: sinus  Axis: normal  Intervals:normal qt  ST&T Change: no stemi, no depressions, poor r wave progression    RADIOLOGY Please see ED Course for my review and interpretation.  I personally  reviewed all radiographic images ordered to evaluate for the above acute complaints and reviewed radiology reports and findings.  These findings were personally discussed with the patient.  Please see medical record for radiology report.    PROCEDURES:  Critical Care performed: No  Procedures   MEDICATIONS ORDERED IN ED: Medications  predniSONE (DELTASONE) tablet 40 mg (has no administration in time range)  ipratropium-albuterol (DUONEB) 0.5-2.5 (3) MG/3ML nebulizer solution 3 mL (3 mLs Nebulization Given 03/13/23 1347)  ipratropium-albuterol (DUONEB) 0.5-2.5 (3) MG/3ML nebulizer solution 3 mL (3 mLs Nebulization Given 03/13/23 1440)  iohexol (OMNIPAQUE) 350 MG/ML injection 75 mL (75 mLs Intravenous Contrast Given 03/13/23 1443)     IMPRESSION / MDM / ASSESSMENT AND PLAN / ED COURSE  I reviewed the triage vital signs and the nursing notes.                               Differential diagnosis includes, but is not limited to, ACS, pericarditis, esophagitis, pe, dissection, pna, bronchitis, costochondritis  Patient presenting to the ER for evaluation of symptoms as described above.  Based on symptoms, risk factors and considered above differential, this presenting complaint could reflect a potentially life-threatening illness therefore the patient will be placed on continuous pulse oximetry and telemetry for monitoring.  Laboratory evaluation will be sent to evaluate for the above complaints.  Will give nebulizer treatment.  He is nontoxic-appearing doubt infectious process.  Will send D-dimer he is otherwise low risk by Wells criteria.    Clinical Course as of 03/13/23 1522  Tue Mar 13, 2023  1326 Chest x-ray my review and interpretation without evidence of pneumothorax or consolidation. [PR]  [id number] D-dimer is elevated.  Patient had mild improvement after nebulizer treatment.  No hypoxia but still feeling dyspnea.  Have a lower suspicion for ACS.  Will order CTA. [PR]  1512 CTA on my review and interpretation with filling defect in the left concerning for clot,  will await formal radiology report. [PR]  [id number] CTA per radiology report without evidence of PE.  Awaiting repeat troponin.  Suspect this more likely secondary to bronchitis.  Will treat as such. [PR]    Clinical Course User Index [PR] Merlyn Lot, MD     FINAL CLINICAL IMPRESSION(S) / ED DIAGNOSES   Final diagnoses:  Atypical chest pain     Rx / DC Orders   ED Discharge Orders          Ordered    albuterol (VENTOLIN HFA) 108 (90 Base) MCG/ACT inhaler  Every 6 hours PRN        03/13/23 1519    predniSONE (DELTASONE) 20 MG tablet  Daily        03/13/23 1519             Note:  This document was prepared using Dragon voice recognition software and may include unintentional dictation errors.    Merlyn Lot, MD 03/13/23 [phone]

## 2023-05-12 DIAGNOSIS — I110 Hypertensive heart disease with heart failure: Secondary | ICD-10-CM | POA: Diagnosis present

## 2023-05-12 DIAGNOSIS — Z79899 Other long term (current) drug therapy: Secondary | ICD-10-CM

## 2023-05-12 DIAGNOSIS — I080 Rheumatic disorders of both mitral and aortic valves: Secondary | ICD-10-CM | POA: Diagnosis present

## 2023-05-12 DIAGNOSIS — Z888 Allergy status to other drugs, medicaments and biological substances status: Secondary | ICD-10-CM

## 2023-05-12 DIAGNOSIS — Z86010 Personal history of colonic polyps: Secondary | ICD-10-CM

## 2023-05-12 DIAGNOSIS — Z955 Presence of coronary angioplasty implant and graft: Secondary | ICD-10-CM

## 2023-05-12 DIAGNOSIS — K5521 Angiodysplasia of colon with hemorrhage: Principal | ICD-10-CM | POA: Diagnosis present

## 2023-05-12 DIAGNOSIS — I2510 Atherosclerotic heart disease of native coronary artery without angina pectoris: Secondary | ICD-10-CM | POA: Diagnosis present

## 2023-05-12 DIAGNOSIS — Z5902 Unsheltered homelessness: Secondary | ICD-10-CM

## 2023-05-12 DIAGNOSIS — E871 Hypo-osmolality and hyponatremia: Secondary | ICD-10-CM | POA: Diagnosis present

## 2023-05-12 DIAGNOSIS — J449 Chronic obstructive pulmonary disease, unspecified: Secondary | ICD-10-CM | POA: Diagnosis present

## 2023-05-12 DIAGNOSIS — I25810 Atherosclerosis of coronary artery bypass graft(s) without angina pectoris: Secondary | ICD-10-CM | POA: Diagnosis present

## 2023-05-12 DIAGNOSIS — G3184 Mild cognitive impairment, so stated: Secondary | ICD-10-CM | POA: Diagnosis present

## 2023-05-12 DIAGNOSIS — Z7951 Long term (current) use of inhaled steroids: Secondary | ICD-10-CM

## 2023-05-12 DIAGNOSIS — R000 Tachycardia, unspecified: Secondary | ICD-10-CM | POA: Diagnosis present

## 2023-05-12 DIAGNOSIS — Z8673 Personal history of transient ischemic attack (TIA), and cerebral infarction without residual deficits: Secondary | ICD-10-CM

## 2023-05-12 DIAGNOSIS — F1010 Alcohol abuse, uncomplicated: Secondary | ICD-10-CM | POA: Diagnosis present

## 2023-05-12 DIAGNOSIS — F17200 Nicotine dependence, unspecified, uncomplicated: Secondary | ICD-10-CM | POA: Diagnosis present

## 2023-05-12 DIAGNOSIS — I5032 Chronic diastolic (congestive) heart failure: Secondary | ICD-10-CM | POA: Diagnosis present

## 2023-05-12 DIAGNOSIS — Z9049 Acquired absence of other specified parts of digestive tract: Secondary | ICD-10-CM

## 2023-05-12 DIAGNOSIS — Z5941 Food insecurity: Secondary | ICD-10-CM

## 2023-05-12 DIAGNOSIS — I252 Old myocardial infarction: Secondary | ICD-10-CM

## 2023-05-12 DIAGNOSIS — Z7982 Long term (current) use of aspirin: Secondary | ICD-10-CM

## 2023-05-12 DIAGNOSIS — D62 Acute posthemorrhagic anemia: Secondary | ICD-10-CM | POA: Diagnosis present

## 2023-05-12 DIAGNOSIS — I2489 Other forms of acute ischemic heart disease: Secondary | ICD-10-CM | POA: Diagnosis present

## 2023-05-12 NOTE — ED Notes (Addendum)
First nurse note: Pt to ED via [facility] from [facility] c.o CP on/off 2 weeks. ETOH on board per EMS. Ems also noted bilateral feet swelling   20G R. AC CBG 174 111 HR 138/70 98%RA

## 2023-05-13 ENCOUNTER — Inpatient Hospital Stay
Admission: EM | Admit: 2023-05-13 | Discharge: 2023-05-18 | DRG: 378 | Disposition: A | Discharge status: Home / Self Care | Payer: 59 | Attending: Internal Medicine | Admitting: Internal Medicine

## 2023-05-13 ENCOUNTER — Other Ambulatory Visit: Payer: Self-pay

## 2023-05-13 ENCOUNTER — Emergency Department: Payer: 59

## 2023-05-13 ENCOUNTER — Inpatient Hospital Stay: Payer: 59

## 2023-05-13 ENCOUNTER — Encounter
Admission: EM | Disposition: A | Discharge status: Home / Self Care | Payer: Self-pay | Source: Home / Self Care | Attending: Hospitalist

## 2023-05-13 ENCOUNTER — Inpatient Hospital Stay: Payer: 59 | Admitting: Anesthesiology

## 2023-05-13 DIAGNOSIS — I2510 Atherosclerotic heart disease of native coronary artery without angina pectoris: Secondary | ICD-10-CM | POA: Diagnosis present

## 2023-05-13 DIAGNOSIS — R4189 Other symptoms and signs involving cognitive functions and awareness: Secondary | ICD-10-CM | POA: Diagnosis present

## 2023-05-13 DIAGNOSIS — K921 Melena: Secondary | ICD-10-CM | POA: Diagnosis present

## 2023-05-13 DIAGNOSIS — Z8673 Personal history of transient ischemic attack (TIA), and cerebral infarction without residual deficits: Secondary | ICD-10-CM | POA: Diagnosis not present

## 2023-05-13 DIAGNOSIS — K5521 Angiodysplasia of colon with hemorrhage: Secondary | ICD-10-CM | POA: Diagnosis present

## 2023-05-13 DIAGNOSIS — Z888 Allergy status to other drugs, medicaments and biological substances status: Secondary | ICD-10-CM | POA: Diagnosis not present

## 2023-05-13 DIAGNOSIS — R079 Chest pain, unspecified: Secondary | ICD-10-CM

## 2023-05-13 DIAGNOSIS — G3184 Mild cognitive impairment, so stated: Secondary | ICD-10-CM | POA: Diagnosis present

## 2023-05-13 DIAGNOSIS — D62 Acute posthemorrhagic anemia: Secondary | ICD-10-CM

## 2023-05-13 DIAGNOSIS — I25810 Atherosclerosis of coronary artery bypass graft(s) without angina pectoris: Secondary | ICD-10-CM | POA: Diagnosis present

## 2023-05-13 DIAGNOSIS — K922 Gastrointestinal hemorrhage, unspecified: Secondary | ICD-10-CM | POA: Diagnosis not present

## 2023-05-13 DIAGNOSIS — E871 Hypo-osmolality and hyponatremia: Secondary | ICD-10-CM | POA: Diagnosis present

## 2023-05-13 DIAGNOSIS — I5032 Chronic diastolic (congestive) heart failure: Secondary | ICD-10-CM

## 2023-05-13 DIAGNOSIS — F1010 Alcohol abuse, uncomplicated: Secondary | ICD-10-CM | POA: Diagnosis present

## 2023-05-13 DIAGNOSIS — D500 Iron deficiency anemia secondary to blood loss (chronic): Secondary | ICD-10-CM | POA: Diagnosis not present

## 2023-05-13 DIAGNOSIS — Z7982 Long term (current) use of aspirin: Secondary | ICD-10-CM | POA: Diagnosis not present

## 2023-05-13 DIAGNOSIS — Z5902 Unsheltered homelessness: Secondary | ICD-10-CM | POA: Diagnosis not present

## 2023-05-13 DIAGNOSIS — F17200 Nicotine dependence, unspecified, uncomplicated: Secondary | ICD-10-CM | POA: Diagnosis present

## 2023-05-13 DIAGNOSIS — I110 Hypertensive heart disease with heart failure: Secondary | ICD-10-CM | POA: Diagnosis present

## 2023-05-13 DIAGNOSIS — J449 Chronic obstructive pulmonary disease, unspecified: Secondary | ICD-10-CM | POA: Diagnosis present

## 2023-05-13 DIAGNOSIS — Z79899 Other long term (current) drug therapy: Secondary | ICD-10-CM | POA: Diagnosis not present

## 2023-05-13 DIAGNOSIS — I080 Rheumatic disorders of both mitral and aortic valves: Secondary | ICD-10-CM | POA: Diagnosis present

## 2023-05-13 DIAGNOSIS — Z7951 Long term (current) use of inhaled steroids: Secondary | ICD-10-CM | POA: Diagnosis not present

## 2023-05-13 DIAGNOSIS — D649 Anemia, unspecified: Principal | ICD-10-CM | POA: Diagnosis present

## 2023-05-13 DIAGNOSIS — R7989 Other specified abnormal findings of blood chemistry: Secondary | ICD-10-CM

## 2023-05-13 DIAGNOSIS — I252 Old myocardial infarction: Secondary | ICD-10-CM | POA: Diagnosis not present

## 2023-05-13 DIAGNOSIS — I10 Essential (primary) hypertension: Secondary | ICD-10-CM | POA: Diagnosis present

## 2023-05-13 DIAGNOSIS — I2489 Other forms of acute ischemic heart disease: Secondary | ICD-10-CM | POA: Diagnosis present

## 2023-05-13 DIAGNOSIS — Z9049 Acquired absence of other specified parts of digestive tract: Secondary | ICD-10-CM | POA: Diagnosis not present

## 2023-05-13 DIAGNOSIS — F1090 Alcohol use, unspecified, uncomplicated: Secondary | ICD-10-CM | POA: Insufficient documentation

## 2023-05-13 DIAGNOSIS — R000 Tachycardia, unspecified: Secondary | ICD-10-CM | POA: Diagnosis present

## 2023-05-13 DIAGNOSIS — Z5941 Food insecurity: Secondary | ICD-10-CM | POA: Diagnosis not present

## 2023-05-13 DIAGNOSIS — Z955 Presence of coronary angioplasty implant and graft: Secondary | ICD-10-CM | POA: Diagnosis not present

## 2023-05-13 HISTORY — PX: ESOPHAGOGASTRODUODENOSCOPY: SHX5428

## 2023-05-13 HISTORY — PX: GIVENS CAPSULE STUDY: SHX5432

## 2023-05-13 LAB — RETICULOCYTES
Immature Retic Fract: 21.6 % — ABNORMAL HIGH (ref 2.3–15.9)
RBC.: 2.53 MIL/uL — ABNORMAL LOW (ref 4.22–5.81)
Retic Count, Absolute: 81 10*3/uL (ref 19.0–186.0)
Retic Ct Pct: 3.2 % — ABNORMAL HIGH (ref 0.4–3.1)

## 2023-05-13 LAB — CBC
HCT: 20.5 % — ABNORMAL LOW (ref 39.0–52.0)
HCT: 25.3 % — ABNORMAL LOW (ref 39.0–52.0)
Hemoglobin: 6.2 g/dL — ABNORMAL LOW (ref 13.0–17.0)
Hemoglobin: 7.7 g/dL — ABNORMAL LOW (ref 13.0–17.0)
MCH: 24.8 pg — ABNORMAL LOW (ref 26.0–34.0)
MCH: 24.8 pg — ABNORMAL LOW (ref 26.0–34.0)
MCHC: 30.2 g/dL (ref 30.0–36.0)
MCHC: 30.4 g/dL (ref 30.0–36.0)
MCV: 81.6 fL (ref 80.0–100.0)
MCV: 82 fL (ref 80.0–100.0)
Platelets: 358 10*3/uL (ref 150–400)
Platelets: 379 10*3/uL (ref 150–400)
RBC: 2.5 MIL/uL — ABNORMAL LOW (ref 4.22–5.81)
RBC: 3.1 MIL/uL — ABNORMAL LOW (ref 4.22–5.81)
RDW: 16.6 % — ABNORMAL HIGH (ref 11.5–15.5)
RDW: 17.5 % — ABNORMAL HIGH (ref 11.5–15.5)
WBC: 10.2 10*3/uL (ref 4.0–10.5)
WBC: 11.9 10*3/uL — ABNORMAL HIGH (ref 4.0–10.5)
nRBC: 0 % (ref 0.0–0.2)
nRBC: 0 % (ref 0.0–0.2)

## 2023-05-13 LAB — BASIC METABOLIC PANEL
Anion gap: 11 (ref 5–15)
BUN: 8 mg/dL (ref 8–23)
CO2: 21 mmol/L — ABNORMAL LOW (ref 22–32)
Calcium: 8.3 mg/dL — ABNORMAL LOW (ref 8.9–10.3)
Chloride: 97 mmol/L — ABNORMAL LOW (ref 98–111)
Creatinine, Ser: 0.91 mg/dL (ref 0.61–1.24)
GFR, Estimated: >60 mL/min (ref >60)
Glucose, Bld: 140 mg/dL — ABNORMAL HIGH (ref 70–99)
Potassium: 3.6 mmol/L (ref 3.5–5.1)
Sodium: 129 mmol/L — ABNORMAL LOW (ref 135–145)

## 2023-05-13 LAB — IRON AND TIBC
Iron: 15 ug/dL — ABNORMAL LOW (ref 45–182)
Saturation Ratios: 4 % — ABNORMAL LOW (ref 17.9–39.5)
TIBC: 428 ug/dL (ref 250–450)
UIBC: 413 ug/dL

## 2023-05-13 LAB — BRAIN NATRIURETIC PEPTIDE: B Natriuretic Peptide: 899.9 pg/mL — ABNORMAL HIGH (ref 0.0–100.0)

## 2023-05-13 LAB — FOLATE: Folate: 7.8 ng/mL (ref >5.9)

## 2023-05-13 LAB — HEMOGLOBIN AND HEMATOCRIT, BLOOD
HCT: 25.8 % — ABNORMAL LOW (ref 39.0–52.0)
Hemoglobin: 8.3 g/dL — ABNORMAL LOW (ref 13.0–17.0)

## 2023-05-13 LAB — TROPONIN I (HIGH SENSITIVITY)
Troponin I (High Sensitivity): 19 ng/L — ABNORMAL HIGH (ref <18)
Troponin I (High Sensitivity): 25 ng/L — ABNORMAL HIGH (ref <18)

## 2023-05-13 LAB — VITAMIN B12: Vitamin B-12: 878 pg/mL (ref 180–914)

## 2023-05-13 LAB — FERRITIN: Ferritin: 6 ng/mL — ABNORMAL LOW (ref 24–336)

## 2023-05-13 LAB — PREPARE RBC (CROSSMATCH)

## 2023-05-13 SURGERY — IMAGING PROCEDURE, GI TRACT, INTRALUMINAL, VIA CAPSULE
Anesthesia: General

## 2023-05-13 SURGERY — Surgical Case
Anesthesia: *Unknown

## 2023-05-13 MED ORDER — FENTANYL CITRATE (PF) 100 MCG/2ML IJ SOLN
INTRAMUSCULAR | Status: AC
  Filled 2023-05-13: qty 2

## 2023-05-13 MED ORDER — FENTANYL CITRATE (PF) 100 MCG/2ML IJ SOLN
INTRAMUSCULAR | Status: DC | PRN
  Administered 2023-05-13: 50 ug via INTRAVENOUS

## 2023-05-13 MED ORDER — FLUTICASONE FUROATE-VILANTEROL 100-25 MCG/ACT IN AEPB
1.0000 | INHALATION_SPRAY | Freq: Every day | RESPIRATORY_TRACT | Status: DC
  Administered 2023-05-14 – 2023-05-18 (×5): 1 via RESPIRATORY_TRACT
  Filled 2023-05-13: qty 28

## 2023-05-13 MED ORDER — FOLIC ACID 1 MG PO TABS
1.0000 mg | ORAL_TABLET | Freq: Every day | ORAL | Status: DC
  Administered 2023-05-13 – 2023-05-18 (×5): 1 mg via ORAL
  Filled 2023-05-13 (×6): qty 1

## 2023-05-13 MED ORDER — IPRATROPIUM-ALBUTEROL 0.5-2.5 (3) MG/3ML IN SOLN
3.0000 mL | Freq: Once | RESPIRATORY_TRACT | Status: AC
  Administered 2023-05-13: 3 mL via RESPIRATORY_TRACT
  Filled 2023-05-13: qty 3

## 2023-05-13 MED ORDER — ONDANSETRON HCL 4 MG/2ML IJ SOLN: 4.0000 mg | Freq: Four times a day (QID) | INTRAMUSCULAR | Status: DC | PRN

## 2023-05-13 MED ORDER — THIAMINE HCL 100 MG/ML IJ SOLN
100.0000 mg | Freq: Once | INTRAMUSCULAR | Status: AC
  Administered 2023-05-13: 100 mg via INTRAVENOUS
  Filled 2023-05-13: qty 2

## 2023-05-13 MED ORDER — LORAZEPAM 2 MG/ML IJ SOLN
1.0000 mg | INTRAMUSCULAR | Status: AC | PRN
  Administered 2023-05-13: 1 mg via INTRAVENOUS
  Filled 2023-05-13: qty 1

## 2023-05-13 MED ORDER — SODIUM CHLORIDE 0.9 % IV SOLN: INTRAVENOUS | Status: DC

## 2023-05-13 MED ORDER — ACETAMINOPHEN 650 MG RE SUPP: 650.0000 mg | Freq: Four times a day (QID) | RECTAL | Status: DC | PRN

## 2023-05-13 MED ORDER — FUROSEMIDE 10 MG/ML IJ SOLN
20.0000 mg | Freq: Once | INTRAMUSCULAR | Status: AC
  Administered 2023-05-13: 20 mg via INTRAVENOUS
  Filled 2023-05-13: qty 4

## 2023-05-13 MED ORDER — SODIUM CHLORIDE 0.9 % IV SOLN
10.0000 mL/h | Freq: Once | INTRAVENOUS | Status: AC
  Administered 2023-05-13: 10 mL/h via INTRAVENOUS

## 2023-05-13 MED ORDER — LORAZEPAM 1 MG PO TABS: 1.0000 mg | ORAL_TABLET | ORAL | Status: AC | PRN

## 2023-05-13 MED ORDER — METOPROLOL TARTRATE 25 MG PO TABS
12.5000 mg | ORAL_TABLET | Freq: Once | ORAL | Status: AC
  Administered 2023-05-14: 12.5 mg via ORAL
  Filled 2023-05-13: qty 1

## 2023-05-13 MED ORDER — GLYCOPYRROLATE 0.2 MG/ML IJ SOLN
INTRAMUSCULAR | Status: AC
  Filled 2023-05-13: qty 1

## 2023-05-13 MED ORDER — PHENYLEPHRINE HCL (PRESSORS) 10 MG/ML IV SOLN
INTRAVENOUS | Status: DC | PRN
  Administered 2023-05-13: 80 ug via INTRAVENOUS

## 2023-05-13 MED ORDER — THIAMINE MONONITRATE 100 MG PO TABS
100.0000 mg | ORAL_TABLET | Freq: Every day | ORAL | Status: DC
  Administered 2023-05-13 – 2023-05-18 (×5): 100 mg via ORAL
  Filled 2023-05-13 (×6): qty 1

## 2023-05-13 MED ORDER — IPRATROPIUM-ALBUTEROL 0.5-2.5 (3) MG/3ML IN SOLN
3.0000 mL | RESPIRATORY_TRACT | Status: DC | PRN
  Administered 2023-05-13: 3 mL via RESPIRATORY_TRACT
  Filled 2023-05-13: qty 3

## 2023-05-13 MED ORDER — ACETAMINOPHEN 325 MG PO TABS
650.0000 mg | ORAL_TABLET | Freq: Four times a day (QID) | ORAL | Status: DC | PRN
  Administered 2023-05-13: 650 mg via ORAL
  Filled 2023-05-13: qty 2

## 2023-05-13 MED ORDER — THIAMINE HCL 100 MG/ML IJ SOLN
100.0000 mg | Freq: Every day | INTRAMUSCULAR | Status: DC
  Administered 2023-05-16: 100 mg via INTRAVENOUS
  Filled 2023-05-13 (×2): qty 2

## 2023-05-13 MED ORDER — LORAZEPAM 2 MG/ML IJ SOLN
0.0000 mg | Freq: Four times a day (QID) | INTRAMUSCULAR | Status: DC
  Administered 2023-05-13: 1 mg via INTRAVENOUS
  Filled 2023-05-13: qty 1

## 2023-05-13 MED ORDER — LIDOCAINE HCL (CARDIAC) PF 100 MG/5ML IV SOSY
PREFILLED_SYRINGE | INTRAVENOUS | Status: DC | PRN
  Administered 2023-05-13: 80 mg via INTRAVENOUS

## 2023-05-13 MED ORDER — SODIUM CHLORIDE 0.9% IV SOLUTION
Freq: Once | INTRAVENOUS | Status: AC
  Administered 2023-05-16: 20 mL via INTRAVENOUS
  Filled 2023-05-13: qty 250

## 2023-05-13 MED ORDER — PROPOFOL 10 MG/ML IV BOLUS
INTRAVENOUS | Status: DC | PRN
  Administered 2023-05-13: 70 mg via INTRAVENOUS
  Administered 2023-05-13: 50 mg via INTRAVENOUS

## 2023-05-13 MED ORDER — ADULT MULTIVITAMIN W/MINERALS CH
1.0000 | ORAL_TABLET | Freq: Every day | ORAL | Status: DC
  Administered 2023-05-13 – 2023-05-18 (×5): 1 via ORAL
  Filled 2023-05-13 (×6): qty 1

## 2023-05-13 MED ORDER — PROPOFOL 1000 MG/100ML IV EMUL
INTRAVENOUS | Status: AC
  Filled 2023-05-13: qty 100

## 2023-05-13 MED ORDER — LORAZEPAM 2 MG/ML IJ SOLN: 0.0000 mg | Freq: Two times a day (BID) | INTRAMUSCULAR | Status: DC

## 2023-05-13 MED ORDER — PANTOPRAZOLE SODIUM 40 MG IV SOLR
40.0000 mg | Freq: Every day | INTRAVENOUS | Status: DC
  Administered 2023-05-13 – 2023-05-14 (×2): 40 mg via INTRAVENOUS
  Filled 2023-05-13 (×2): qty 10

## 2023-05-13 MED ORDER — LIDOCAINE HCL (PF) 2 % IJ SOLN
INTRAMUSCULAR | Status: AC
  Filled 2023-05-13: qty 5

## 2023-05-13 MED ORDER — ONDANSETRON HCL 4 MG PO TABS: 4.0000 mg | ORAL_TABLET | Freq: Four times a day (QID) | ORAL | Status: DC | PRN

## 2023-05-13 NOTE — ED Notes (Signed)
Patient to Endo at this time.

## 2023-05-13 NOTE — H&P (Signed)
Midge Minium, MD [facility] [address], [address] [place], [place] [address] [phone] Fax : [phone]  Primary Care Physician:  Jodi Marble, NP Primary Gastroenterologist:  Dr. Servando Snare  Pre-Procedure History & Physical: HPI:  [name] is a 62 y.o. male is here for an endoscopy.   Past Medical History:  Diagnosis Date   COPD (chronic obstructive pulmonary disease) (HCC)    CVA (cerebral infarction)    Headache    mild, since stroke 2012   Hypertension    MI (myocardial infarction) (HCC) 05/09/2012   Reading difficulty    pt reports he reads at "about a second grade level"   Stroke [facility]) 2012   numbness to left hand    Wears dentures    full upper and lower    Past Surgical History:  Procedure Laterality Date   CAROTID ENDARTERECTOMY Left    2012 or 2013   CHOLECYSTECTOMY N/A 07/26/2016   Procedure: LAPAROSCOPIC CHOLECYSTECTOMY;  Surgeon: Lattie Haw, MD;  Location: [facility] ORS;  Service: General;  Laterality: N/A;   COLONOSCOPY WITH PROPOFOL N/A 02/09/2022   Procedure: COLONOSCOPY WITH PROPOFOL;  Surgeon: Midge Minium, MD;  Location: [facility] ENDOSCOPY;  Service: Endoscopy;  Laterality: N/A;   CORONARY ARTERY BYPASS GRAFT  05/10/2012   3 vessel   ESOPHAGOGASTRODUODENOSCOPY (EGD) WITH PROPOFOL N/A 10/28/2022   Procedure: ESOPHAGOGASTRODUODENOSCOPY (EGD) WITH PROPOFOL;  Surgeon: Jaynie Collins, DO;  Location: [facility] ENDOSCOPY;  Service: Gastroenterology;  Laterality: N/A;    Prior to Admission medications   Medication Sig Start Date End Date Taking? Authorizing Provider  amLODipine (NORVASC) 5 MG tablet Take 5 mg by mouth daily. 04/27/23  Yes [provider]  aspirin EC (ASPIRIN LOW DOSE) 81 MG tablet Take 81 mg by mouth daily. 04/27/23  Yes [provider]  escitalopram (LEXAPRO) 5 MG tablet Take 5 mg by mouth daily. 04/19/23  Yes [provider]  naltrexone (DEPADE) 50 MG tablet Take 50 mg by mouth daily. 04/16/23  Yes [provider]  albuterol (VENTOLIN HFA) 108 (90 Base) MCG/ACT inhaler Inhale 2 puffs into the lungs every 4 (four) hours as needed for wheezing or shortness of breath. 08/14/22   Raechel Chute, MD  albuterol (VENTOLIN HFA) 108 (90 Base) MCG/ACT inhaler Inhale 2 puffs into the lungs every 6 (six) hours as needed for wheezing or shortness of breath. 03/13/23   Willy Eddy, MD  cetirizine (ZYRTEC) 10 MG tablet Take 10 mg by mouth daily. 08/04/22   [provider]  donepezil (ARICEPT) 10 MG tablet Take 10 mg by mouth daily.     [provider]  fluticasone (FLONASE) 50 MCG/ACT nasal spray Place 2 sprays into both nostrils daily. 08/04/22   [provider]  Melatonin 10 MG CAPS Take 1 capsule by mouth at bedtime. 08/04/22   [provider]  metoprolol succinate (TOPROL-XL) 25 MG 24 hr tablet Take 25 mg by mouth daily. 05/24/21   [provider]  omeprazole (PRILOSEC) 20 MG capsule Take 1 capsule (20 mg total) by mouth 2 (two) times daily before a meal. 06/05/21   Rolly Salter, MD  pravastatin (PRAVACHOL) 20 MG tablet Take 20 mg by mouth at bedtime.     [provider]  TRELEGY ELLIPTA 100-62.5-25 MCG/INH AEPB Inhale 1 puff into the lungs daily. 05/24/21   [provider]  vitamin B-12 (CYANOCOBALAMIN) 1000 MCG tablet Take 1 tablet (1,000 mcg total) by mouth daily. 06/05/21   Rolly Salter, MD  Allergies as of 05/12/2023 - Review Complete 03/13/2023  Allergen Reaction Noted   Other  01/17/2022    Family History  Problem Relation Age of Onset   Pneumonia Mother     Social History   Socioeconomic History   Marital status: Divorced    Spouse name: Not on file   Number of children: Not on file   Years of education: Not on file   Highest education level: Not on file  Occupational History   Not on file  Tobacco Use   Smoking status: Former    Packs/day: 4.00    Years: 43.00    Additional pack years: 0.00    Total pack  years: 172.00    Types: Cigarettes    Quit date: 39    Years since quitting: 31.4   Smokeless tobacco: Current    Types: Snuff   Tobacco comments:    changed to dip  Vaping Use   Vaping Use: Never used  Substance and Sexual Activity   Alcohol use: Yes    Alcohol/week: 26.0 standard drinks of alcohol    Types: 26 Cans of beer per week   Drug use: No   Sexual activity: Yes    Birth control/protection: None  Other Topics Concern   Not on file  Social History Narrative   Not on file   Social Determinants of Health   Financial Resource Strain: Not on file  Food Insecurity: Food Insecurity Present (10/25/2022)   Hunger Vital Sign    Worried About Running Out of Food in the Last Year: Often true    Ran Out of Food in the Last Year: Sometimes true  Transportation Needs: No Transportation Needs (10/25/2022)   PRAPARE - Administrator, Civil Service (Medical): No    Lack of Transportation (Non-Medical): No  Physical Activity: Not on file  Stress: Not on file  Social Connections: Not on file  Intimate Partner Violence: Not At Risk (10/25/2022)   Humiliation, Afraid, Rape, and Kick questionnaire    Fear of Current or Ex-Partner: No    Emotionally Abused: No    Physically Abused: No    Sexually Abused: No    Review of Systems: See HPI, otherwise negative ROS  Physical Exam: BP (!) 142/89   Pulse (!) 111   Temp (!) 97.2 F (36.2 C) (Temporal)   Resp (!) 23   Ht 6' (1.829 m)   Wt 94.3 kg   SpO2 98%   BMI 28.21 kg/m  General:   Alert,  pleasant and cooperative in NAD Head:  Normocephalic and atraumatic. Neck:  Supple; no masses or thyromegaly. Lungs:  Clear throughout to auscultation.    Heart:  Regular rate and rhythm. Abdomen:  Soft, nontender and nondistended. Normal bowel sounds, without guarding, and without rebound.   Neurologic:  Alert and  oriented x4;  grossly normal neurologically.  Impression/Plan: [name] is here for an endoscopy to be  performed for melena  Risks, benefits, limitations, and alternatives regarding  endoscopy have been reviewed with the patient.  Questions have been answered.  All parties agreeable.   Midge Minium, MD  05/13/2023, 1:15 PM

## 2023-05-13 NOTE — Assessment & Plan Note (Signed)
Supportive care. 

## 2023-05-13 NOTE — Progress Notes (Signed)
Givens capsule study started on the patient at 13:30 today  Instructions: Nothing to eat or drink until 15:30  May have clear liquids, such as water or apple juice are OK from 15:30 until 17:30 May eat a light meal at 17:30 (4 hours after ingestion of the capsule)  All of the dietary allowances listed above should be followed per the dietary orders that are clinically appropriate and placed by physician.  Please keep all equipment with the patient.  May remove the equipment from the patient after 21:30 tonight and a RN from Endoscopy or an ENDO Tech will come to the patient's room and pick up the equipment tomorrow, May 14, 2023.

## 2023-05-13 NOTE — Assessment & Plan Note (Addendum)
Chest pain with elevated troponin History of CABG Chest pain, troponin of 19 but EKG nonischemic Suspect supply/demand mismatch related to severe anemia Treat anemia and monitor Continue pravastatin and metoprolol.  Appears that aspirin was discontinued during November hospitalization

## 2023-05-13 NOTE — Anesthesia Preprocedure Evaluation (Addendum)
Anesthesia Evaluation  Patient identified by MRN, date of birth, ID band Patient awake    Reviewed: Allergy & Precautions, NPO status , Patient's Chart, lab work & pertinent test results  History of Anesthesia Complications Negative for: history of anesthetic complications  Airway Mallampati: I   Neck ROM: Full    Dental  (+) Edentulous Upper, Edentulous Lower   Pulmonary COPD (on home O2 PRN), former smoker   Pulmonary exam normal breath sounds clear to auscultation       Cardiovascular hypertension, + CAD (s/p MI and CABG 2013) and + Peripheral Vascular Disease (bilateral carotid stenosis s/p CEA)  + Valvular Problems/Murmurs AS  Rhythm:Regular Rate:Normal + Systolic murmurs ECG 05/13/23: Sinus tachycardia Nonspecific ST abnormality  Myocardial perfusion 08/11/19:  LVEF= 60%   Regional wall motion:  Normal function  The overall quality of the study is fair.  Artifacts noted: no  Left ventricular cavity: normal.  Perfusion Analysis: SPECT images demonstrate small perfusion abnormality of mild intensity is present in the apical myocardial region on the stress images. Resting images show minimal apical myocardial perfusion defect consistent with previous infarct and/or scar without evidence of myocardial ischemia defect type : Fixed  Echo 08/11/19:  NORMAL LEFT VENTRICULAR SYSTOLIC FUNCTION  WITH MILD LVH  NORMAL RIGHT VENTRICULAR SYSTOLIC FUNCTION  TRIVIAL REGURGITATION NOTED  MILD AORTIC VALVE STENOSIS WITH A CALCULATED AVA = 1.2 cm^2 BY CONTINUITY EQUATION  TECHNICALLY DIFFICULT STUDY  POOR SOUND TRANSMISSION  LIMITED VIEWS    Neuro/Psych  Headaches Chewing tobacco use; alcohol use disorder CVA (2012, residual left hand numbness)    GI/Hepatic negative GI ROS,,,  Endo/Other  Obesity   Renal/GU Renal disease (stage III CKD)     Musculoskeletal   Abdominal   Peds  Hematology  (+) Blood dyscrasia, anemia    Anesthesia Other Findings Cardiology note 12/08/21:  Assessment   62 y.o. male with  Encounter Diagnoses  Name Primary?   Bilateral carotid artery stenosis Yes   Benign essential HTN   Coronary artery disease involving coronary bypass graft of native heart without angina pectoris   Hyperlipidemia, mixed   Mild aortic valve stenosis   Stage 3a chronic kidney disease (CMS-HCC)   Plan  -The patient has been informed of the significant concerns of carotid atherosclerosis disease process progression. They know that continued treatment of risk factors is the cornerstone to reducing these concerns. We have had a discussion of the future goals in this treatment, medication modification, antiplatelet therapy, statin therapy, diet and exercise. They voice understanding.  -There has been a review of medication management for hypertension control and of the current medical regimen used. The patient understands all the risks and benefits of hypertension control with these medications to reduce possible future cardiovascular complications and risk of disease. There will be no changes in medication regimen necessary today. -Patient is to continue watch closely for any significant new progression of symptoms of stenosis of coronary artery bypass grafts. They are to continue physical activity on a daily basis and use of current medical regimen for risk reduction. We have had long discussion of physical activity as a marker for the possibility of following for symptoms of bypass graft or native artery stenosis. The patient understands and agrees with plan. -The patient understands the risks and benefits of medication management for cardiovascular risk factors including lipid management. We plan on continuing to reduce cardiovascular risk and cardiovascular disease process by lowering LDL between 30% and 50% if achievable. We have discussed other  lifestyle measures to help with this risk reduction as  well. -Continue to follow for any significant aortic valve stenosis symptoms including shortness of breath, chest pain, syncope, and/or heart failure. The patient will continue regular physical activity and monitor for these symptoms listed above. We will plan on follow-up and further echocardiographic and/or functional assessment in the future as needed depending on symptoms and activity levels. The patient currently understands the pathophysiology of aortic stenosis. -The patient will have continued renal protection with medical management for chronic kidney disease. We have discussed other lifestyle modification as well including appropriate low sodium diet. The patient understands the increased risk of morbidity, mortality, and hospitalization associated with chronic kidney disease. -We have discussed a diet and lifestyle program to improve quality of life measures and cardiovascular risk reduction. This includes a diet rich in fruits, vegetables, fiber, and nuts with lower amounts of saturated fats. The patient was also instructed to do regular daily activity to help slowly burn calories. We have discussed the possibility of a exercise prescription as well. The patient is advised to begin this activity and watch for improvements over the next few months.  No orders of the defined types were placed in this encounter.  Return in about 9 months (around 09/08/2022).    Reproductive/Obstetrics                             Anesthesia Physical Anesthesia Plan  ASA: 4  Anesthesia Plan: General   Post-op Pain Management:    Induction: Intravenous  PONV Risk Score and Plan: 2 and Treatment may vary due to age or medical condition  Airway Management Planned: Natural Airway  Additional Equipment:   Intra-op Plan:   Post-operative Plan:   Informed Consent: I have reviewed the patients History and Physical, chart, labs and discussed the procedure including the risks,  benefits and alternatives for the proposed anesthesia with the patient or authorized representative who has indicated his/her understanding and acceptance.       Plan Discussed with: CRNA  Anesthesia Plan Comments: (LMA/GETA backup discussed.  Patient consented for risks of anesthesia including but not limited to:  - adverse reactions to medications - damage to eyes, teeth, lips or other oral mucosa - nerve damage due to positioning  - sore throat or hoarseness - damage to heart, brain, nerves, lungs, other parts of body or loss of life  Informed patient about role of CRNA in peri- and intra-operative care.  Patient voiced understanding.)        Anesthesia Quick Evaluation

## 2023-05-13 NOTE — Assessment & Plan Note (Signed)
Continue pravastatin.  Not on aspirin

## 2023-05-13 NOTE — Assessment & Plan Note (Addendum)
Hydralazine as needed for BP control

## 2023-05-13 NOTE — ED Notes (Signed)
Report given to Kennedy with Endo.

## 2023-05-13 NOTE — Assessment & Plan Note (Signed)
Suspect from dehydration versus alcohol use IV hydration with NS and monitor

## 2023-05-13 NOTE — Op Note (Signed)
[facility] Patient Name: [name] Procedure Date: 05/13/2023 12:43 PM MRN: [id number] Account #: [id number] Date of Birth: July 15, 1961 Admit Type: Emergency Department Age: 62 Room: [facility] ENDO ROOM 3 Gender: Male Note Status: Finalized Instrument Name: Upper Endoscope [id number] Procedure:             Upper GI endoscopy Indications:           Melena Providers:             Midge Minium MD, MD Medicines:             Propofol per Anesthesia Complications:         No immediate complications. Procedure:             Pre-Anesthesia Assessment:                        - Prior to the procedure, a History and Physical was                         performed, and patient medications and allergies were                         reviewed. The patient's tolerance of previous                         anesthesia was also reviewed. The risks and benefits                         of the procedure and the sedation options and risks                         were discussed with the patient. All questions were                         answered, and informed consent was obtained. Prior                         Anticoagulants: The patient has taken no anticoagulant                         or antiplatelet agents. ASA Grade Assessment: II - A                         patient with mild systemic disease. After reviewing                         the risks and benefits, the patient was deemed in                         satisfactory condition to undergo the procedure.                        After obtaining informed consent, the endoscope was                         passed under direct vision. Throughout the procedure,                         the patient's blood  pressure, pulse, and oxygen                         saturations were monitored continuously. The Endoscope                         was introduced through the mouth, and advanced to the                         second part of duodenum.  The upper GI endoscopy was                         accomplished without difficulty. The patient tolerated                         the procedure well. Findings:      The Z-line was irregular and was found at the gastroesophageal junction.      The stomach was normal.      The examined duodenum was normal. Impression:            - Z-line irregular, at the gastroesophageal junction.                        - Normal stomach.                        - Normal examined duodenum.                        - No specimens collected. Recommendation:        - Return patient to ED for ongoing care.                        - Perform an upper GI endoscopy today. Procedure Code(s):     --- Professional ---                        [phone], Esophagogastroduodenoscopy, flexible,                         transoral; diagnostic, including collection of                         specimen(s) by brushing or washing, when performed                         (separate procedure) Diagnosis Code(s):     --- Professional ---                        K92.1, Melena (includes Hematochezia) CPT copyright 2022 American Medical Association. All rights reserved. The codes documented in this report are preliminary and upon coder review may  be revised to meet current compliance requirements. Midge Minium MD, MD 05/13/2023 1:14:33 PM This report has been signed electronically. Number of Addenda: 0 Note Initiated On: 05/13/2023 12:43 PM Estimated Blood Loss:  Estimated blood loss: none.      [facility]

## 2023-05-13 NOTE — TOC CM/SW Note (Signed)
Substance abuse resources added to AVS.   Alfonso Ramus, LCSW [phone]

## 2023-05-13 NOTE — ED Notes (Signed)
Pt resting comfortably with eyes closed at this time. Respirations even and unlabored.
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
## 2023-05-13 NOTE — Transfer of Care (Signed)
Immediate Anesthesia Transfer of Care Note  Patient: [name]  Procedure(s) Performed: ENDOSCOPIC RETROGRADE CHOLANGIOPANCREATOGRAPHY (ERCP) WITH PROPOFOL GIVENS CAPSULE STUDY  Patient Location: PACU and Endoscopy Unit  Anesthesia Type:General  Level of Consciousness: awake  Airway & Oxygen Therapy: Patient Spontanous Breathing  Post-op Assessment: Report given to RN  Post vital signs: Reviewed and stable  Last Vitals:  Vitals Value Taken Time  BP 101/53 05/13/23 1321  Temp 36.2 C 05/13/23 1320  Pulse 108 05/13/23 1323  Resp 23 05/13/23 1323  SpO2 98 % 05/13/23 1323  Vitals shown include unvalidated device data.  Last Pain:  Vitals:   05/13/23 1320  TempSrc: Temporal  PainSc: 0-No pain         Complications: No notable events documented.

## 2023-05-13 NOTE — ED Notes (Signed)
Advised nurse that patient has ready bed 

## 2023-05-13 NOTE — Assessment & Plan Note (Signed)
CIWA withdrawal protocol 

## 2023-05-13 NOTE — Consult Note (Signed)
Midge Minium, MD [facility]  [address] [place], [place] [address] Phone: [phone] Fax : [phone]  Consultation  Referring Provider:     Dr. Para March Primary Care Physician:  Jodi Marble, NP Primary Gastroenterologist:  Dr. Servando Snare         Reason for Consultation:     Symptomatic anemia  Date of Admission:  05/13/2023 Date of Consultation:  05/13/2023   HPI:   [name] is a 62 y.o. male who is seen by me for anemia back in November of last year approximately 7 months ago.  At that time the patient had been found to have a hemoglobin down to 7.0.  He was also reported that he was having dark stools at that time.  The patient had a colonoscopy in February of that year.  At the time of the colonoscopy the patient was found to have 3 polyps.  Two of the polyps were sessile serrated polyps and the third was a adenoma.  On the admission in November the patient underwent an upper endoscopy that showed diffuse gastric atrophy without any sign of active bleeding. Prior to discharge the patient was recommended outpatient follow-up in 7 to 10 days with Dr. Tobi Bastos to arrange a capsule study to investigate the small bowel for possible cause of bleeding.  It does not appear that the patient ever followed up with those recommendations of having the small bowel capsule study. The patient has a history of COPD, hypertension, coronary artery disease status post CABG in 2013 with a CVA.  The patient had been admitted with tachycardia with a blood pressure of 158/82.  The patient came to the emergency department this admission with chest pain and weakness for several weeks with dark tarry stools. On this admission the patient was found to have a hemoglobin of 6.2 with a hematocrit of 20.5 and iron of 15 with a saturation of 4%.  The patient was ordered a blood transfusion.  The patient's hemoglobin 2 months ago was 12.5 but 6 months ago was 8.9. The patient continues to drink alcohol and  states he has approximately 3 days a week where he drinks a few 40 ounce beers.  Past Medical History:  Diagnosis Date   COPD (chronic obstructive pulmonary disease) (HCC)    CVA (cerebral infarction)    Headache    mild, since stroke 2012   Hypertension    MI (myocardial infarction) (HCC) 05/09/2012   Reading difficulty    pt reports he reads at "about a second grade level"   Stroke [facility] 2012   numbness to left hand    Wears dentures    full upper and lower    Past Surgical History:  Procedure Laterality Date   CAROTID ENDARTERECTOMY Left    2012 or 2013   CHOLECYSTECTOMY N/A 07/26/2016   Procedure: LAPAROSCOPIC CHOLECYSTECTOMY;  Surgeon: Lattie Haw, MD;  Location: [facility] ORS;  Service: General;  Laterality: N/A;   COLONOSCOPY WITH PROPOFOL N/A 02/09/2022   Procedure: COLONOSCOPY WITH PROPOFOL;  Surgeon: Midge Minium, MD;  Location: [facility] ENDOSCOPY;  Service: Endoscopy;  Laterality: N/A;   CORONARY ARTERY BYPASS GRAFT  05/10/2012   3 vessel   ESOPHAGOGASTRODUODENOSCOPY (EGD) WITH PROPOFOL N/A 10/28/2022   Procedure: ESOPHAGOGASTRODUODENOSCOPY (EGD) WITH PROPOFOL;  Surgeon: Jaynie Collins, DO;  Location: [facility] ENDOSCOPY;  Service: Gastroenterology;  Laterality: N/A;    Prior to Admission medications   Medication Sig Start Date End Date Taking? Authorizing Provider  amLODipine (  NORVASC) 5 MG tablet Take 5 mg by mouth daily. 04/27/23  Yes [provider]  aspirin EC (ASPIRIN LOW DOSE) 81 MG tablet Take 81 mg by mouth daily. 04/27/23  Yes [provider]  escitalopram (LEXAPRO) 5 MG tablet Take 5 mg by mouth daily. 04/19/23  Yes [provider]  naltrexone (DEPADE) 50 MG tablet Take 50 mg by mouth daily. 04/16/23  Yes [provider]  albuterol (VENTOLIN HFA) 108 (90 Base) MCG/ACT inhaler Inhale 2 puffs into the lungs every 4 (four) hours as needed for wheezing or shortness of breath. 08/14/22   Raechel Chute, MD  albuterol (VENTOLIN HFA) 108  (90 Base) MCG/ACT inhaler Inhale 2 puffs into the lungs every 6 (six) hours as needed for wheezing or shortness of breath. 03/13/23   Willy Eddy, MD  cetirizine (ZYRTEC) 10 MG tablet Take 10 mg by mouth daily. 08/04/22   [provider]  donepezil (ARICEPT) 10 MG tablet Take 10 mg by mouth daily.     [provider]  fluticasone (FLONASE) 50 MCG/ACT nasal spray Place 2 sprays into both nostrils daily. 08/04/22   [provider]  Melatonin 10 MG CAPS Take 1 capsule by mouth at bedtime. 08/04/22   [provider]  metoprolol succinate (TOPROL-XL) 25 MG 24 hr tablet Take 25 mg by mouth daily. 05/24/21   [provider]  omeprazole (PRILOSEC) 20 MG capsule Take 1 capsule (20 mg total) by mouth 2 (two) times daily before a meal. 06/05/21   Rolly Salter, MD  pravastatin (PRAVACHOL) 20 MG tablet Take 20 mg by mouth at bedtime.     [provider]  TRELEGY ELLIPTA 100-62.5-25 MCG/INH AEPB Inhale 1 puff into the lungs daily. 05/24/21   [provider]  vitamin B-12 (CYANOCOBALAMIN) 1000 MCG tablet Take 1 tablet (1,000 mcg total) by mouth daily. 06/05/21   Rolly Salter, MD    Family History  Problem Relation Age of Onset   Pneumonia Mother      Social History   Tobacco Use   Smoking status: Former    Packs/day: 4.00    Years: 43.00    Additional pack years: 0.00    Total pack years: 172.00    Types: Cigarettes    Quit date: 31    Years since quitting: 31.4   Smokeless tobacco: Current    Types: Snuff   Tobacco comments:    changed to dip  Vaping Use   Vaping Use: Never used  Substance Use Topics   Alcohol use: Yes    Alcohol/week: 26.0 standard drinks of alcohol    Types: 26 Cans of beer per week   Drug use: No    Allergies as of 05/12/2023 - Review Complete 03/13/2023  Allergen Reaction Noted   Other  01/17/2022    Review of Systems:    All systems reviewed and negative except where noted in HPI.   Physical  Exam:  Vital signs in last 24 hours: Temp:  [97.8 F (36.6 C)-98.9 F (37.2 C)] 97.8 F (36.6 C) (05/26 0639) Pulse Rate:  [105-118] 118 (05/26 0524) Resp:  [14-28] 14 (05/26 0700) BP: (101-158)/(59-94) 108/68 (05/26 0700) SpO2:  [98 %-99 %] 99 % (05/26 0400) Weight:  [94.3 kg] 94.3 kg (05/26 0002)   General:   Pleasant, cooperative in NAD Head:  Normocephalic and atraumatic. Eyes:   No icterus.   Conjunctiva pink. PERRLA. Ears:  Normal auditory acuity. Neck:  Supple; no masses or thyroidomegaly Lungs: Respirations  even and unlabored. Lungs clear to auscultation bilaterally.   No wheezes, crackles, or rhonchi.  Heart:  Regular rate and rhythm;  Without murmur, clicks, rubs or gallops Abdomen:  Soft, nondistended, nontender. Normal bowel sounds. No appreciable masses or hepatomegaly.  No rebound or guarding.  Rectal:  Not performed. Msk:  Symmetrical without gross deformities.   Extremities:  Without edema, cyanosis or clubbing. Neurologic:  Alert and oriented x3;  grossly normal neurologically. Skin:  Intact without significant lesions or rashes. Cervical Nodes:  No significant cervical adenopathy. Psych:  Alert and cooperative. Normal affect.  LAB RESULTS: Recent Labs    05/13/23 0005  WBC 11.9*  HGB 6.2*  HCT 20.5*  PLT 358   BMET Recent Labs    05/13/23 0005  NA 129*  K 3.6  CL 97*  CO2 21*  GLUCOSE 140*  BUN 8  CREATININE 0.91  CALCIUM 8.3*   LFT No results for input(s): "PROT", "ALBUMIN", "AST", "ALT", "ALKPHOS", "BILITOT", "BILIDIR", "IBILI" in the last 72 hours. PT/INR No results for input(s): "LABPROT", "INR" in the last 72 hours.  STUDIES: DG Chest 2 View  Result Date: 05/13/2023 CLINICAL DATA:  Chest pain EXAM: CHEST - 2 VIEW COMPARISON:  03/13/2023 FINDINGS: Prior CABG. There is hyperinflation of the lungs compatible with COPD. Heart and mediastinal contours are within normal limits. No focal opacities or effusions. No acute bony abnormality.  IMPRESSION: COPD.  No active disease. Electronically Signed   By: Charlett Nose M.D.   On: 05/13/2023 00:28      Impression / Plan:   Assessment: Active Problems:   Coronary artery disease involving coronary bypass graft of native heart without angina pectoris   Hypertension   Cognitive impairment   History of stroke s/p left carotid endarterectomy   Symptomatic anemia   Melena   Hyponatremia   Alcohol use disorder   Chest pain   Elevated troponin   Chronic diastolic CHF (congestive heart failure) (HCC)   ABLA (acute blood loss anemia)   [name] is a 62 y.o. y/o male with who came to emergency department with symptomatic anemia.  The patient has had a GI workup with an EGD and colonoscopy and was recommended to undergo a capsule endoscopy which he had never gone through.  The patient now comes in with symptomatic anemia and significantly low hemoglobin.    Plan:  The patient will be set up for a upper endoscopy for today to rule out any upper GI bleeding.  If the upper endoscopy is negative then the patient will have a capsule endoscopy placed to look for a source of the bleeding in the small bowel.  The patient has been explained the plan and agrees with it.  Thank you for involving me in the care of this patient.      LOS: 0 days   Midge Minium, MD, [facility] 05/13/2023, 7:56 AM,  Pager [phone] 7am-5pm  Check AMION for 5pm -7am coverage and on weekends   Note: This dictation was prepared with Dragon dictation along with smaller phrase technology. Any transcriptional errors that result from this process are unintentional.

## 2023-05-13 NOTE — Progress Notes (Signed)
   05/13/23 2029  Assess: MEWS Score  BP 124/68  MAP (mmHg) 85  Pulse Rate (!) 127 (Reported to RN Nurse.)  SpO2 99 %  O2 Device Room Air  Assess: MEWS Score  MEWS Temp 0  MEWS Systolic 0  MEWS Pulse 2  MEWS RR 0  MEWS LOC 0  MEWS Score 2  MEWS Score Color Yellow  Assess: if the MEWS score is Yellow or Red  Were vital signs taken at a resting state? Yes  Focused Assessment No change from prior assessment  Does the patient meet 2 or more of the SIRS criteria? No  MEWS guidelines implemented  Yes, yellow  Treat  MEWS Interventions Considered administering scheduled or prn medications/treatments as ordered  Take Vital Signs  Increase Vital Sign Frequency  Yellow: Q2hr x1, continue Q4hrs until patient remains green for 12hrs  Escalate  MEWS: Escalate Yellow: Discuss with charge nurse and consider notifying provider and/or RRT  Notify: Charge Nurse/RN  Name of Charge Nurse/RN Notified Margaret Pyle RN  Provider Notification  Provider Name/Title Docia Furl RN  Date Provider Notified 05/13/23  Time Provider Notified 2051  Method of Notification Page  Notification Reason Critical Result  Date of Provider Response 05/13/23  Assess: SIRS CRITERIA  SIRS Temperature  0  SIRS Pulse 1  SIRS Respirations  0  SIRS WBC 0  SIRS Score Sum  1

## 2023-05-13 NOTE — ED Triage Notes (Addendum)
Pt to ED via [facility] for CP x 3 days. Pt is currently living in his car. Pt is CAOx4, in no acute distress. Pt denies any N/V at this time.

## 2023-05-13 NOTE — Anesthesia Postprocedure Evaluation (Signed)
Anesthesia Post Note  Patient: [name]  Procedure(s) Performed: GIVENS CAPSULE STUDY ESOPHAGOGASTRODUODENOSCOPY (EGD)  Patient location during evaluation: PACU Anesthesia Type: General Level of consciousness: awake and alert, oriented and patient cooperative Pain management: pain level controlled Vital Signs Assessment: post-procedure vital signs reviewed and stable Respiratory status: spontaneous breathing, nonlabored ventilation and respiratory function stable Cardiovascular status: blood pressure returned to baseline and stable Postop Assessment: adequate PO intake Anesthetic complications: no   No notable events documented.   Last Vitals:  Vitals:   05/13/23 1340 05/13/23 1350  BP: 101/65 (!) 144/86  Pulse: 100 (!) 114  Resp: 17 18  Temp:    SpO2: 97% 99%    Last Pain:  Vitals:   05/13/23 1350  TempSrc:   PainSc: 0-No pain                 Reed Breech

## 2023-05-13 NOTE — H&P (Signed)
History and Physical    Patient: [name] WUJ:[id number] DOB: 01/28/1961 DOA: 05/13/2023 DOS: the patient was seen and examined on 05/13/2023 PCP: Jodi Marble, NP  Patient coming from: Home  Chief Complaint:  Chief Complaint  Patient presents with   Chest Pain    HPI: [name] is a 62 y.o. male with medical history significant for COPD, HTN, CAD s/p CABG 2013,  prior CVA s/p left carotid endarterectomy,cognitive impairment, alcohol use disorder, hospitalized in November 2023 for symptomatic anemia of 6.3 with negative EGD, who presents to the ED with chest pain and weakness and several weeks of dark tarry stools.  He denies nausea or vomiting or abdominal pain and denies shortness of breath.  Patient's last colonoscopy was in 01/2022 when he had resection of 3 benign polyps .  EGD during his hospitalization in November 2023 was unrevealing.  Review of discharge summary reveals that plans were for possible capsule endoscopy and/or repeat colonoscopy, but it appears that this has not been done. ED course and data review: Tachycardic to 115 with BP 158/82.  Labs significant for hemoglobin 6.2, Down from 12.5 a couple months prior.  Sodium 129, troponin 19.  Ferritin 6, iron 15. EKG, personally viewed and interpreted showing sinus tachycardia at 112 with no ischemic ST-T wave changes. Chest x-ray shows COPD with no active disease. Patient was started on transfusion of 1 of 2 units and also given a thiamine injection. Hospitalist consulted for admission.   Review of Systems: As mentioned in the history of present illness. All other systems reviewed and are negative.  Past Medical History:  Diagnosis Date   COPD (chronic obstructive pulmonary disease) (HCC)    CVA (cerebral infarction)    Headache    mild, since stroke 2012   Hypertension    MI (myocardial infarction) (HCC) 05/09/2012   Reading difficulty    pt reports he reads at "about a second grade level"   Stroke [facility]) 2012    numbness to left hand    Wears dentures    full upper and lower   Past Surgical History:  Procedure Laterality Date   CAROTID ENDARTERECTOMY Left    2012 or 2013   CHOLECYSTECTOMY N/A 07/26/2016   Procedure: LAPAROSCOPIC CHOLECYSTECTOMY;  Surgeon: Lattie Haw, MD;  Location: [facility] ORS;  Service: General;  Laterality: N/A;   COLONOSCOPY WITH PROPOFOL N/A 02/09/2022   Procedure: COLONOSCOPY WITH PROPOFOL;  Surgeon: Midge Minium, MD;  Location: [facility] ENDOSCOPY;  Service: Endoscopy;  Laterality: N/A;   CORONARY ARTERY BYPASS GRAFT  05/10/2012   3 vessel   ESOPHAGOGASTRODUODENOSCOPY (EGD) WITH PROPOFOL N/A 10/28/2022   Procedure: ESOPHAGOGASTRODUODENOSCOPY (EGD) WITH PROPOFOL;  Surgeon: Jaynie Collins, DO;  Location: [facility] ENDOSCOPY;  Service: Gastroenterology;  Laterality: N/A;   Social History:  reports that he quit smoking about 31 years ago. His smoking use included cigarettes. He has a 172.00 pack-year smoking history. His smokeless tobacco use includes snuff. He reports current alcohol use of about 26.0 standard drinks of alcohol per week. He reports that he does not use drugs.  Allergies  Allergen Reactions   Other     Welbutrin - has paranoia     Family History  Problem Relation Age of Onset   Pneumonia Mother     Prior to Admission medications   Medication Sig Start Date End Date Taking? Authorizing Provider  albuterol (VENTOLIN HFA) 108 (90 Base) MCG/ACT inhaler Inhale 2 puffs into the lungs every 4 (four) hours as needed for wheezing  or shortness of breath. 08/14/22   Raechel Chute, MD  albuterol (VENTOLIN HFA) 108 (90 Base) MCG/ACT inhaler Inhale 2 puffs into the lungs every 6 (six) hours as needed for wheezing or shortness of breath. 03/13/23   Willy Eddy, MD  cetirizine (ZYRTEC) 10 MG tablet Take 10 mg by mouth daily. 08/04/22   [provider]  donepezil (ARICEPT) 10 MG tablet Take 10 mg by mouth daily.     [provider]  fluticasone  (FLONASE) 50 MCG/ACT nasal spray Place 2 sprays into both nostrils daily. 08/04/22   [provider]  Melatonin 10 MG CAPS Take 1 capsule by mouth at bedtime. 08/04/22   [provider]  metoprolol succinate (TOPROL-XL) 25 MG 24 hr tablet Take 25 mg by mouth daily. 05/24/21   [provider]  omeprazole (PRILOSEC) 20 MG capsule Take 1 capsule (20 mg total) by mouth 2 (two) times daily before a meal. 06/05/21   Rolly Salter, MD  pravastatin (PRAVACHOL) 20 MG tablet Take 20 mg by mouth at bedtime.     [provider]  TRELEGY ELLIPTA 100-62.5-25 MCG/INH AEPB Inhale 1 puff into the lungs daily. 05/24/21   [provider]  vitamin B-12 (CYANOCOBALAMIN) 1000 MCG tablet Take 1 tablet (1,000 mcg total) by mouth daily. 06/05/21   Rolly Salter, MD    Physical Exam: Vitals:   05/13/23 0002 05/13/23 0229  BP: 119/61 (!) 158/82  Pulse: (!) 105 (!) 115  Resp: 16   Temp: 98.9 F (37.2 C)   TempSrc: Oral   SpO2: 98%   Weight: 94.3 kg   Height: 6' (1.829 m)    Physical Exam Vitals and nursing note reviewed.  Constitutional:      General: He is not in acute distress. HENT:     Head: Normocephalic and atraumatic.  Cardiovascular:     Rate and Rhythm: Regular rhythm. Tachycardia present.     Heart sounds: Normal heart sounds.  Pulmonary:     Effort: Pulmonary effort is normal.     Breath sounds: Normal breath sounds.  Abdominal:     Palpations: Abdomen is soft.     Tenderness: There is no abdominal tenderness.  Neurological:     Mental Status: Mental status is at baseline.     Labs on Admission: I have personally reviewed following labs and imaging studies  CBC: Recent Labs  Lab 05/13/23 0005  WBC 11.9*  HGB 6.2*  HCT 20.5*  MCV 82.0  PLT 358   Basic Metabolic Panel: Recent Labs  Lab 05/13/23 0005  NA 129*  K 3.6  CL 97*  CO2 21*  GLUCOSE 140*  BUN 8  CREATININE 0.91  CALCIUM 8.3*   GFR: Estimated Creatinine Clearance: 97.8  mL/min (by C-G formula based on SCr of 0.91 mg/dL). Liver Function Tests: No results for input(s): "AST", "ALT", "ALKPHOS", "BILITOT", "PROT", "ALBUMIN" in the last 168 hours. No results for input(s): "LIPASE", "AMYLASE" in the last 168 hours. No results for input(s): "AMMONIA" in the last 168 hours. Coagulation Profile: No results for input(s): "INR", "PROTIME" in the last 168 hours. Cardiac Enzymes: No results for input(s): "CKTOTAL", "CKMB", "CKMBINDEX", "TROPONINI" in the last 168 hours. BNP (last 3 results) No results for input(s): "PROBNP" in the last 8760 hours. HbA1C: No results for input(s): "HGBA1C" in the last 72 hours. CBG: No results for input(s): "GLUCAP" in the last 168 hours. Lipid Profile: No results for input(s): "CHOL", "HDL", "LDLCALC", "TRIG", "CHOLHDL", "LDLDIRECT" in the last  72 hours. Thyroid Function Tests: No results for input(s): "TSH", "T4TOTAL", "FREET4", "T3FREE", "THYROIDAB" in the last 72 hours. Anemia Panel: Recent Labs    05/13/23 0005 05/13/23 0109  FOLATE  --  7.8  FERRITIN  --  6*  TIBC  --  428  IRON  --  15*  RETICCTPCT 3.2*  --    Urine analysis:    Component Value Date/Time   COLORURINE YELLOW (A) 06/03/2021 2055   APPEARANCEUR CLOUDY (A) 06/03/2021 2055   APPEARANCEUR Clear 09/09/2014 1708   LABSPEC 1.012 06/03/2021 2055   LABSPEC 1.011 09/09/2014 1708   PHURINE 6.0 06/03/2021 2055   GLUCOSEU 50 (A) 06/03/2021 2055   GLUCOSEU Negative 09/09/2014 1708   HGBUR SMALL (A) 06/03/2021 2055   BILIRUBINUR NEGATIVE 06/03/2021 2055   BILIRUBINUR Negative 09/09/2014 1708   KETONESUR 5 (A) 06/03/2021 2055   PROTEINUR 100 (A) 06/03/2021 2055   NITRITE NEGATIVE 06/03/2021 2055   LEUKOCYTESUR NEGATIVE 06/03/2021 2055   LEUKOCYTESUR Negative 09/09/2014 1708    Radiological Exams on Admission: DG Chest 2 View  Result Date: 05/13/2023 CLINICAL DATA:  Chest pain EXAM: CHEST - 2 VIEW COMPARISON:  03/13/2023 FINDINGS: Prior CABG. There is  hyperinflation of the lungs compatible with COPD. Heart and mediastinal contours are within normal limits. No focal opacities or effusions. No acute bony abnormality. IMPRESSION: COPD.  No active disease. Electronically Signed   By: Charlett Nose M.D.   On: 05/13/2023 00:28     Data Reviewed: Relevant notes from primary care and specialist visits, past discharge summaries as available in EHR, including Care Everywhere. Prior diagnostic testing as pertinent to current admission diagnoses Updated medications and problem lists for reconciliation ED course, including vitals, labs, imaging, treatment and response to treatment Triage notes, nursing and pharmacy notes and ED provider's notes Notable results as noted in HPI   Assessment and Plan: Melena Symptomatic anemia, suspect acute on chronic GI blood loss Hemoglobin 6.2 down from 12.5 a couple months prior EGD November 2023 was unrevealing.  Colonoscopy from February 2023 with benign polyps Continue transfusion of 2 units PRBCs IV Protonix GI consult  Coronary artery disease involving coronary bypass graft of native heart without angina pectoris Chest pain with elevated troponin History of CABG Chest pain, troponin of 19 but EKG nonischemic Suspect supply/demand mismatch related to severe anemia Treat anemia and monitor Continue pravastatin and metoprolol.  Appears that aspirin was discontinued during November hospitalization  Chronic diastolic CHF (congestive heart failure) (HCC)  EF 60-65%, Gr II DD 10/17/2022 Monitor for fluid overload with transfusions and IV hydration Daily weights with intake and output monitoring  Hyponatremia Suspect from dehydration versus alcohol use IV hydration with NS and monitor  Alcohol use disorder CIWA withdrawal protocol  History of stroke s/p left carotid endarterectomy Continue pravastatin.  Not on aspirin  Cognitive impairment Supportive care  Hypertension Hydralazine as needed for BP  control    DVT prophylaxis: SCD  Consults: GI Dr Servando Snare  Advance Care Planning:   Code Status: Prior   Family Communication: none  Disposition Plan: Back to previous home environment  Severity of Illness: The appropriate patient status for this patient is INPATIENT. Inpatient status is judged to be reasonable and necessary in order to provide the required intensity of service to ensure the patient's safety. The patient's presenting symptoms, physical exam findings, and initial radiographic and laboratory data in the context of their chronic comorbidities is felt to place them at high risk for further clinical deterioration. Furthermore,  it is not anticipated that the patient will be medically stable for discharge from the hospital within 2 midnights of admission.   * I certify that at the point of admission it is my clinical judgment that the patient will require inpatient hospital care spanning beyond 2 midnights from the point of admission due to high intensity of service, high risk for further deterioration and high frequency of surveillance required.*  Author: Andris Baumann, MD 05/13/2023 3:01 AM  For on call review [email or web address].

## 2023-05-13 NOTE — Progress Notes (Incomplete)
       CROSS COVER NOTE  NAME: [name] MRN: [id number] DOB : [birth year] ATTENDING PHYSICIAN: Darlin Priestly, MD   #Chronic diastolic CHF Patient reporting dyspnea after receiving 2U PRBCs today and maintenance IVF at 75 mL/hr - Hold IVF - 20 mg IV Lasix - CXR - BNP--> 899  #Sinus Tachycardia, HR 130s-140s Home metoprolol XL has been held in setting of Acute Blood Loss Anemia secondary to GI bleed - Repeat H&H--> 8.3 - 12.5 mg PO Lopressor x1   DG Chest Port 1 View  Result Date: 05/13/2023 CLINICAL DATA:  [id number] Dyspnea [id number] EXAM: PORTABLE CHEST 1 VIEW COMPARISON:  Chest x-ray 05/13/2023 FINDINGS: The heart and mediastinal contours are unchanged. Aortic calcification. No focal consolidation. No pulmonary edema. No pleural effusion. No pneumothorax. No acute osseous abnormality.  Sternotomy wires are intact. IMPRESSION: 1. No active disease. 2.  Aortic Atherosclerosis (ICD10-I70.0). Electronically Signed   By: Tish Frederickson M.D.   On: 05/13/2023 22:15     To reach the provider On-Call:   7AM- 7PM see care teams to locate the attending and reach out to them via [email or web address] Password: [email or web address] 7PM-7AM contact night-coverage If you still have difficulty reaching the appropriate provider, please page the [facility] (Director on Call) for [facility] on amion for assistance  This document was prepared using Conservation officer, historic buildings and may include unintentional dictation errors.  Bishop Limbo DNP, MBA, FNP-BC, PMHNP-BC Nurse Practitioner [facility] Pager [phone]

## 2023-05-13 NOTE — Assessment & Plan Note (Signed)
EF 60-65%, Gr II DD 10/17/2022 Monitor for fluid overload with transfusions and IV hydration Daily weights with intake and output monitoring

## 2023-05-13 NOTE — Assessment & Plan Note (Signed)
Symptomatic anemia, suspect acute on chronic GI blood loss Hemoglobin 6.2 down from 12.5 a couple months prior EGD November 2023 was unrevealing.  Colonoscopy from February 2023 with benign polyps Continue transfusion of 2 units PRBCs IV Protonix GI consult

## 2023-05-13 NOTE — Discharge Instructions (Signed)
                  Intensive Outpatient Programs  [facility]    [facility] [address]     [address] [place],  [place]     [place], [place] [phone]      [phone]  [facility]   [facility]  (Inpatient and outpatient)  [phone] (Suboxone and Methadone) [address]           [phone]           [facility]    [facility] - Intensive Outpatient [address]     [address] [place], [place] [address]     [place], [place]  [phone]      [phone]  [facility] (Outpatient, Inpatient, Chemical  [facility] (Groups and Residental) (insurance only) [phone]    [place], [place]          [phone]       [facility]    [facility] (for caregivers and family) [address]     [address] [place], [place]     [place], [place] [phone]      [phone]  Residential Treatment Programs  [facility]  [facility](2 years) Residential: 90 days)  [facility] [address]      [address] [place], [place]     [place], [place] [phone]      [phone] or [phone]  [facility]    [facility] [address]      [address] [place], [place]     [place], [place] [phone]      [phone]  [facility]   [facility] ([facility]) [address]     [address] [place], [place] [address]     , [place] [phone]      [phone] Admissions: 8am-3pm M-F  [facility]: Residential Program (90 Days)              [facility]:  [facility]  [place],      [place],   [phone] or [phone]    (Walk in Hours over the weekend or by referral)   [facility]: [facility]:[phone] (for crisis  response 24 hours a day) 

## 2023-05-14 DIAGNOSIS — D62 Acute posthemorrhagic anemia: Secondary | ICD-10-CM

## 2023-05-14 LAB — TYPE AND SCREEN
ABO/RH(D): A POS
Antibody Screen: NEGATIVE
Unit division: 0
Unit division: 0

## 2023-05-14 LAB — CBC
HCT: 26.6 % — ABNORMAL LOW (ref 39.0–52.0)
Hemoglobin: 8.3 g/dL — ABNORMAL LOW (ref 13.0–17.0)
MCH: 25 pg — ABNORMAL LOW (ref 26.0–34.0)
MCHC: 31.2 g/dL (ref 30.0–36.0)
MCV: 80.1 fL (ref 80.0–100.0)
Platelets: 343 10*3/uL (ref 150–400)
RBC: 3.32 MIL/uL — ABNORMAL LOW (ref 4.22–5.81)
RDW: 17.2 % — ABNORMAL HIGH (ref 11.5–15.5)
WBC: 14.5 10*3/uL — ABNORMAL HIGH (ref 4.0–10.5)
nRBC: 0 % (ref 0.0–0.2)

## 2023-05-14 LAB — BASIC METABOLIC PANEL
Anion gap: 7 (ref 5–15)
BUN: 10 mg/dL (ref 8–23)
CO2: 26 mmol/L (ref 22–32)
Calcium: 8.2 mg/dL — ABNORMAL LOW (ref 8.9–10.3)
Chloride: 98 mmol/L (ref 98–111)
Creatinine, Ser: 0.91 mg/dL (ref 0.61–1.24)
GFR, Estimated: >60 mL/min (ref >60)
Glucose, Bld: 120 mg/dL — ABNORMAL HIGH (ref 70–99)
Potassium: 3.7 mmol/L (ref 3.5–5.1)
Sodium: 131 mmol/L — ABNORMAL LOW (ref 135–145)

## 2023-05-14 LAB — BPAM RBC
Blood Product Expiration Date: 202406202359
Blood Product Expiration Date: 202406212359
ISSUE DATE / TIME: 202405260335
ISSUE DATE / TIME: 202405261456
Unit Type and Rh: 6200
Unit Type and Rh: 6200

## 2023-05-14 LAB — MAGNESIUM: Magnesium: 1.9 mg/dL (ref 1.7–2.4)

## 2023-05-14 LAB — TROPONIN I (HIGH SENSITIVITY)
Troponin I (High Sensitivity): 247 ng/L (ref <18)
Troponin I (High Sensitivity): 248 ng/L (ref <18)

## 2023-05-14 MED ORDER — METOPROLOL SUCCINATE ER 25 MG PO TB24
25.0000 mg | ORAL_TABLET | Freq: Every day | ORAL | Status: DC
  Administered 2023-05-14 – 2023-05-18 (×5): 25 mg via ORAL
  Filled 2023-05-14 (×5): qty 1

## 2023-05-14 MED ORDER — ASPIRIN 81 MG PO TBEC
81.0000 mg | DELAYED_RELEASE_TABLET | Freq: Every day | ORAL | Status: DC
  Administered 2023-05-14 – 2023-05-18 (×4): 81 mg via ORAL
  Filled 2023-05-14 (×5): qty 1

## 2023-05-14 MED ORDER — ENOXAPARIN SODIUM 40 MG/0.4ML IJ SOSY
40.0000 mg | PREFILLED_SYRINGE | INTRAMUSCULAR | Status: DC
  Administered 2023-05-14 – 2023-05-17 (×4): 40 mg via SUBCUTANEOUS
  Filled 2023-05-14 (×4): qty 0.4

## 2023-05-14 MED ORDER — DONEPEZIL HCL 5 MG PO TABS
10.0000 mg | ORAL_TABLET | Freq: Every day | ORAL | Status: DC
  Administered 2023-05-14 – 2023-05-17 (×4): 10 mg via ORAL
  Filled 2023-05-14 (×4): qty 2

## 2023-05-14 MED ORDER — MIRTAZAPINE 15 MG PO TABS
15.0000 mg | ORAL_TABLET | Freq: Every day | ORAL | Status: DC
  Administered 2023-05-14 – 2023-05-17 (×4): 15 mg via ORAL
  Filled 2023-05-14 (×4): qty 1

## 2023-05-14 MED ORDER — ESCITALOPRAM OXALATE 10 MG PO TABS
5.0000 mg | ORAL_TABLET | Freq: Every day | ORAL | Status: DC
  Administered 2023-05-14 – 2023-05-18 (×4): 5 mg via ORAL
  Filled 2023-05-14 (×5): qty 1

## 2023-05-14 NOTE — TOC CM/SW Note (Signed)
SDOH flag for food insecurity. Added resources to AVS.  Charlynn Court, CSW [phone]

## 2023-05-14 NOTE — Progress Notes (Addendum)
Midge Minium, MD [facility]   [address] [place], [place] [address] Phone: [phone] Fax : [phone]   Subjective: This patient has had no further sign of any GI bleeding.  The patient had a capsule endoscopy study done overnight with the recorder picked up earlier today.  The patient denies any abdominal pain and his hemoglobin has been stable.   Objective: Vital signs in last 24 hours: Vitals:   05/13/23 2332 05/14/23 0300 05/14/23 0446 05/14/23 0749  BP: 133/76  111/79 (!) 139/98  Pulse: (!) 142 99 (!) 107 (!) 113  Resp:   18 18  Temp:   98.7 F (37.1 C) 98.5 F (36.9 C)  TempSrc:   Oral   SpO2: 95%  99% 99%  Weight:      Height:       Weight change:   Intake/Output Summary (Last 24 hours) at 05/14/2023 1450 Last data filed at 05/14/2023 1418 Gross per 24 hour  Intake 601.03 ml  Output 2350 ml  Net -1748.97 ml     Exam: Heart:: Regular rate and rhythm or without murmur or extra heart sounds Lungs: normal and clear to auscultation and percussion Abdomen: soft, nontender, normal bowel sounds   Lab Results: @LABTEST2 @ Micro Results: No results found for this or any previous visit (from the past 240 hour(s)). Studies/Results: DG Chest Port 1 View  Result Date: 05/13/2023 CLINICAL DATA:  [id number] Dyspnea [id number] EXAM: PORTABLE CHEST 1 VIEW COMPARISON:  Chest x-ray 05/13/2023 FINDINGS: The heart and mediastinal contours are unchanged. Aortic calcification. No focal consolidation. No pulmonary edema. No pleural effusion. No pneumothorax. No acute osseous abnormality.  Sternotomy wires are intact. IMPRESSION: 1. No active disease. 2.  Aortic Atherosclerosis (ICD10-I70.0). Electronically Signed   By: Tish Frederickson M.D.   On: 05/13/2023 22:15   DG Chest 2 View  Result Date: 05/13/2023 CLINICAL DATA:  Chest pain EXAM: CHEST - 2 VIEW COMPARISON:  03/13/2023 FINDINGS: Prior CABG. There is hyperinflation of the lungs compatible with COPD. Heart and mediastinal  contours are within normal limits. No focal opacities or effusions. No acute bony abnormality. IMPRESSION: COPD.  No active disease. Electronically Signed   By: Charlett Nose M.D.   On: 05/13/2023 00:28   Medications: I have reviewed the patient's current medications. Scheduled Meds:  sodium chloride   Intravenous Once   aspirin EC  81 mg Oral Daily   donepezil  10 mg Oral Daily   escitalopram  5 mg Oral Daily   fluticasone furoate-vilanterol  1 puff Inhalation Daily   folic acid  1 mg Oral Daily   LORazepam  0-4 mg Intravenous Q6H   Followed by   Melene Muller ON 05/15/2023] LORazepam  0-4 mg Intravenous Q12H   metoprolol succinate  25 mg Oral Daily   mirtazapine  15 mg Oral QHS   multivitamin with minerals  1 tablet Oral Daily   pantoprazole (PROTONIX) IV  40 mg Intravenous QAC breakfast   thiamine  100 mg Oral Daily   Or   thiamine  100 mg Intravenous Daily   Continuous Infusions: PRN Meds:.acetaminophen **OR** acetaminophen, ipratropium-albuterol, LORazepam **OR** LORazepam, ondansetron **OR** ondansetron (ZOFRAN) IV   Assessment: Principal Problem:   ABLA (acute blood loss anemia) Active Problems:   Coronary artery disease involving coronary bypass graft of native heart without angina pectoris   Hypertension   Cognitive impairment   History of stroke s/p left carotid endarterectomy   Symptomatic anemia   Melena   Hyponatremia  Alcohol use disorder   Chest pain   Elevated troponin   Chronic diastolic CHF (congestive heart failure) (HCC)    Plan: This patient was admitted with anemia and melena and his hemoglobin is now stable.  The upper endoscopy did not show any sign of any active or recent bleeding.  The patient had a capsule swallow yesterday for small bowel investigation.  The recorder was picked up this morning and will be downloaded today and will be read tomorrow.     LOS: 1 day   Sherlyn Hay 05/14/2023, 2:50 PM Pager [phone] 7am-5pm  Check AMION for  5pm -7am coverage and on weekends

## 2023-05-14 NOTE — Progress Notes (Signed)
  PROGRESS NOTE    [name]  ZOX:[id number] DOB: [birth year] DOA: 05/13/2023 PCP: Jodi Marble, NP  203A/203A-AA  LOS: 1 day   Brief hospital course:   Assessment & Plan: [name] is a 62 y.o. male with medical history significant for COPD, HTN, CAD s/p CABG 2013,  prior CVA s/p left carotid endarterectomy,cognitive impairment, alcohol use disorder, hospitalized in November 2023 for symptomatic anemia of 6.3 with negative EGD, who presents to the ED with chest pain and weakness and several weeks of dark tarry stools.  He denies nausea or vomiting or abdominal pain and denies shortness of breath.  Patient's last colonoscopy was in 01/2022 when he had resection of 3 benign polyps .  EGD during his hospitalization in November 2023 was unrevealing.  Review of discharge summary reveals that plans were for possible capsule endoscopy and/or repeat colonoscopy, but it appears that this has not been done.    Melena Symptomatic anemia, suspect acute on chronic GI blood loss Hemoglobin 6.2 down from 12.5 a couple months prior EGD November 2023 was unrevealing.  Colonoscopy from February 2023 with benign polyps --s/p 2 units PRBCs --repeat EGD wnl Plan: --pending capsule study result --d/c IV PPI  Coronary artery disease involving coronary bypass graft of native heart without angina pectoris Chest pain with elevated troponin History of CABG Chest pain, troponin of 19, 25 on presentation but EKG nonischemic --trop today increased to 247, can still be demand, but will consult cardio given pt's hx of CAD.  Chronic diastolic CHF (congestive heart failure) (HCC)  EF 60-65%, Gr II DD 10/17/2022 --resume Toprol today  Hyponatremia, chronic  Alcohol use disorder CIWA withdrawal protocol  History of stroke s/p left carotid endarterectomy --cont home ASA and statin  Cognitive impairment --cont home donepezil  Hypertension --resume Toprol today   DVT prophylaxis: Lovenox SQ Code  Status: Full code  Family Communication:  Level of care: Med-Surg Dispo:   The patient is from: homeless Anticipated d/c is to: to be determined Anticipated d/c date is: to be determined   Subjective and Interval History:  Overnight complained of dyspnea and RN noted tachycardia.    No chest pain today.   Objective: Vitals:   05/14/23 0300 05/14/23 0446 05/14/23 0749 05/14/23 1540  BP:  111/79 (!) 139/98 104/61  Pulse: 99 (!) 107 (!) 113 (!) 105  Resp:  18 18 18   Temp:  98.7 F (37.1 C) 98.5 F (36.9 C) 98.4 F (36.9 C)  TempSrc:  Oral    SpO2:  99% 99% 96%  Weight:      Height:        Intake/Output Summary (Last 24 hours) at 05/14/2023 1728 Last data filed at 05/14/2023 1418 Gross per 24 hour  Intake 601.03 ml  Output 2350 ml  Net -1748.97 ml   Filed Weights   05/13/23 0002  Weight: 94.3 kg    Examination:   Constitutional: NAD, AAOx3 HEENT: conjunctivae and lids normal, EOMI CV: No cyanosis.   RESP: normal respiratory effort, on RA Neuro: II - XII grossly intact.   Psych: Normal mood and affect.     Data Reviewed: I have personally reviewed labs and imaging studies  Time spent: 50 minutes  Darlin Priestly, MD [facility] If 7PM-7AM, please contact night-coverage 05/14/2023, 5:28 PM

## 2023-05-15 ENCOUNTER — Encounter: Payer: Self-pay | Admitting: Gastroenterology

## 2023-05-15 DIAGNOSIS — D62 Acute posthemorrhagic anemia: Secondary | ICD-10-CM | POA: Diagnosis not present

## 2023-05-15 LAB — CBC
HCT: 30 % — ABNORMAL LOW (ref 39.0–52.0)
Hemoglobin: 9.1 g/dL — ABNORMAL LOW (ref 13.0–17.0)
MCH: 24.9 pg — ABNORMAL LOW (ref 26.0–34.0)
MCHC: 30.3 g/dL (ref 30.0–36.0)
MCV: 82.2 fL (ref 80.0–100.0)
Platelets: 395 10*3/uL (ref 150–400)
RBC: 3.65 MIL/uL — ABNORMAL LOW (ref 4.22–5.81)
RDW: 17.2 % — ABNORMAL HIGH (ref 11.5–15.5)
WBC: 13 10*3/uL — ABNORMAL HIGH (ref 4.0–10.5)
nRBC: 0 % (ref 0.0–0.2)

## 2023-05-15 LAB — BASIC METABOLIC PANEL
Anion gap: 10 (ref 5–15)
BUN: 14 mg/dL (ref 8–23)
CO2: 27 mmol/L (ref 22–32)
Calcium: 8.6 mg/dL — ABNORMAL LOW (ref 8.9–10.3)
Chloride: 96 mmol/L — ABNORMAL LOW (ref 98–111)
Creatinine, Ser: 1.11 mg/dL (ref 0.61–1.24)
GFR, Estimated: >60 mL/min (ref >60)
Glucose, Bld: 116 mg/dL — ABNORMAL HIGH (ref 70–99)
Potassium: 3.7 mmol/L (ref 3.5–5.1)
Sodium: 133 mmol/L — ABNORMAL LOW (ref 135–145)

## 2023-05-15 LAB — MAGNESIUM: Magnesium: 2.2 mg/dL (ref 1.7–2.4)

## 2023-05-15 NOTE — Progress Notes (Signed)
The patient's hemoglobin is slightly up today.  The capsule endoscopy equipment was under maintenance and with downloading is taking place today.  The capsule will be read tomorrow.  If the patient has no signs of any further bleeding and his hemoglobin is stable he can follow-up as an outpatient and come back if he should have any further black stools.

## 2023-05-15 NOTE — Progress Notes (Signed)
   05/15/23 1400  Spiritual Encounters  Type of Visit Initial  Care provided to: Patient  Referral source IDT Rounds  Reason for visit Routine spiritual support  OnCall Visit No  Spiritual Framework  Presenting Themes Meaning/purpose/sources of inspiration;Coping tools;Courage hope and growth;Community and relationships  Community/Connection Family  Patient Stress Factors Family relationships;Health changes  Family Stress Factors Family relationships  Interventions  Spiritual Care Interventions Made Established relationship of care and support;Compassionate presence;Reflective listening;Explored values/beliefs/practices/strengths;Prayer;Encouragement  Intervention Outcomes  Outcomes Connection to spiritual care;Awareness around self/spiritual resourses  Spiritual Care Plan  Spiritual Care Issues Still Outstanding Chaplain will continue to follow   Chaplain went to do an initial visit with the patient. Spoke with patient about his health struggles and about his family. Patient shared with me that his son is struggling with addiction and that he is rally concerned about him. I listened to the patient and offered encouraging words. Also prayed with the patient for his health and for his family.

## 2023-05-15 NOTE — Plan of Care (Signed)
  Problem: Education: Goal: Knowledge of General Education information will improve Description: Including pain rating scale, medication(s)/side effects and non-pharmacologic comfort measures Outcome: Progressing   Problem: Health Behavior/Discharge Planning: Goal: Ability to manage health-related needs will improve Outcome: Progressing   Problem: Activity: Goal: Risk for activity intolerance will decrease Outcome: Progressing   Problem: Nutrition: Goal: Adequate nutrition will be maintained Outcome: Progressing   Problem: Coping: Goal: Level of anxiety will decrease Outcome: Progressing   Problem: Safety: Goal: Ability to remain free from injury will improve Outcome: Progressing   Problem: Safety: Goal: Ability to remain free from injury will improve Outcome: Progressing   Problem: Skin Integrity: Goal: Risk for impaired skin integrity will decrease Outcome: Progressing

## 2023-05-15 NOTE — Progress Notes (Signed)
  PROGRESS NOTE    [name]  ZOX:[id number] DOB: 1961/04/06 DOA: 05/13/2023 PCP: Jodi Marble, NP  203A/203A-AA  LOS: 2 days   Brief hospital course:   Assessment & Plan: [name] is a 62 y.o. male with medical history significant for COPD, HTN, CAD s/p CABG 2013,  prior CVA s/p left carotid endarterectomy, cognitive impairment, alcohol use disorder, hospitalized in November 2023 for symptomatic anemia of 6.3 with negative EGD, who presented to the ED with chest pain and weakness and several weeks of dark tarry stools.    Patient's last colonoscopy was in 01/2022 when he had resection of 3 benign polyps.  EGD during his hospitalization in November 2023 was unrevealing.  Review of discharge summary reveals that plans were for possible capsule endoscopy and/or repeat colonoscopy, but it appears that this has not been done.    Melena Symptomatic anemia, suspect acute on chronic GI blood loss Hemoglobin 6.2 down from 12.5 a couple months prior. --s/p 2 units PRBCs --repeat EGD wnl Plan: --pending capsule study result  Coronary artery disease involving coronary bypass graft of native heart without angina pectoris Chest pain with elevated troponin History of CABG Chest pain, troponin of 19, 25 on presentation but EKG nonischemic.  Trop increased to 247 the next day, can still be demand, but consulted cardio given pt's hx of CAD. --cardio to see pt tomorrow  Chronic diastolic CHF (congestive heart failure) (HCC)  EF 60-65%, Gr II DD 10/17/2022 --cont home Toprol  Hyponatremia, chronic  Alcohol use disorder --pt said he doesn't drink a lot.  No signs of withdrawal.    History of stroke s/p left carotid endarterectomy --cont home ASA and statin  Cognitive impairment --cont home donepezil  Hypertension --cont home Toprol   DVT prophylaxis: Lovenox SQ Code Status: Full code  Family Communication:  Level of care: Med-Surg Dispo:   The patient is from: homeless, lives  in his car.  Community case manager has been helping him look for housing for the past 6 months. Anticipated d/c is to: to his car or shelter Anticipated d/c date is: likely tomorrow, after capsule study result and cardio sees.   Subjective and Interval History:  No chest pain.  No new complaint.     Objective: Vitals:   05/15/23 0412 05/15/23 0752 05/15/23 1504 05/15/23 1521  BP: 115/70 107/63 (!) 86/48 118/75  Pulse: 93 (!) 101 80 85  Resp: 20 18 18    Temp: 98.6 F (37 C) 98.5 F (36.9 C) 98.2 F (36.8 C)   TempSrc: Oral Oral Oral   SpO2: 98% 97% 100% 100%  Weight:      Height:        Intake/Output Summary (Last 24 hours) at 05/15/2023 1825 Last data filed at 05/15/2023 1454 Gross per 24 hour  Intake 960 ml  Output 450 ml  Net 510 ml   Filed Weights   05/13/23 0002  Weight: 94.3 kg    Examination:   Constitutional: NAD, AAOx3 HEENT: conjunctivae and lids normal, EOMI CV: No cyanosis.   RESP: normal respiratory effort, on RA Neuro: II - XII grossly intact.     Data Reviewed: I have personally reviewed labs and imaging studies  Time spent: 35 minutes  Darlin Priestly, MD [facility] If 7PM-7AM, please contact night-coverage 05/15/2023, 6:25 PM

## 2023-05-15 NOTE — Plan of Care (Signed)
°  Problem: Nutrition: °Goal: Adequate nutrition will be maintained °Outcome: Progressing °  °Problem: Coping: °Goal: Level of anxiety will decrease °Outcome: Progressing °  °Problem: Safety: °Goal: Ability to remain free from injury will improve °Outcome: Progressing °  °

## 2023-05-16 ENCOUNTER — Inpatient Hospital Stay
Admit: 2023-05-16 | Discharge: 2023-05-16 | Disposition: A | Discharge status: Home / Self Care | Payer: 59 | Attending: Cardiology | Admitting: Cardiology

## 2023-05-16 ENCOUNTER — Inpatient Hospital Stay: Payer: 59 | Admitting: Certified Registered"

## 2023-05-16 ENCOUNTER — Encounter: Payer: Self-pay | Admitting: Internal Medicine

## 2023-05-16 ENCOUNTER — Other Ambulatory Visit: Payer: Self-pay

## 2023-05-16 ENCOUNTER — Encounter
Admission: EM | Disposition: A | Discharge status: Home / Self Care | Payer: Self-pay | Source: Home / Self Care | Attending: Hospitalist

## 2023-05-16 DIAGNOSIS — K921 Melena: Secondary | ICD-10-CM | POA: Diagnosis not present

## 2023-05-16 DIAGNOSIS — D62 Acute posthemorrhagic anemia: Secondary | ICD-10-CM | POA: Diagnosis not present

## 2023-05-16 HISTORY — PX: ENTEROSCOPY: SHX5533

## 2023-05-16 LAB — CBC
HCT: 25.3 % — ABNORMAL LOW (ref 39.0–52.0)
Hemoglobin: 7.8 g/dL — ABNORMAL LOW (ref 13.0–17.0)
MCH: 25.2 pg — ABNORMAL LOW (ref 26.0–34.0)
MCHC: 30.8 g/dL (ref 30.0–36.0)
MCV: 81.6 fL (ref 80.0–100.0)
Platelets: 333 10*3/uL (ref 150–400)
RBC: 3.1 MIL/uL — ABNORMAL LOW (ref 4.22–5.81)
RDW: 17.2 % — ABNORMAL HIGH (ref 11.5–15.5)
WBC: 11 10*3/uL — ABNORMAL HIGH (ref 4.0–10.5)
nRBC: 0 % (ref 0.0–0.2)

## 2023-05-16 LAB — BASIC METABOLIC PANEL
Anion gap: 7 (ref 5–15)
BUN: 15 mg/dL (ref 8–23)
CO2: 28 mmol/L (ref 22–32)
Calcium: 8.5 mg/dL — ABNORMAL LOW (ref 8.9–10.3)
Chloride: 102 mmol/L (ref 98–111)
Creatinine, Ser: 1 mg/dL (ref 0.61–1.24)
GFR, Estimated: >60 mL/min (ref >60)
Glucose, Bld: 139 mg/dL — ABNORMAL HIGH (ref 70–99)
Potassium: 4 mmol/L (ref 3.5–5.1)
Sodium: 137 mmol/L (ref 135–145)

## 2023-05-16 LAB — ECHOCARDIOGRAM COMPLETE
AR max vel: 0.8 cm2
AV Area VTI: 0.89 cm2
AV Area mean vel: 0.86 cm2
AV Mean grad: 22 mmHg
AV Peak grad: 37.5 mmHg
Ao pk vel: 3.06 m/s
Area-P 1/2: 2.72 cm2
Height: 72 in
MV VTI: 1.8 cm2
S' Lateral: 2.6 cm
Weight: 3328 oz

## 2023-05-16 LAB — MAGNESIUM: Magnesium: 2.2 mg/dL (ref 1.7–2.4)

## 2023-05-16 SURGERY — ENTEROSCOPY
Anesthesia: General

## 2023-05-16 MED ORDER — LIDOCAINE HCL (CARDIAC) PF 100 MG/5ML IV SOSY
PREFILLED_SYRINGE | INTRAVENOUS | Status: DC | PRN
  Administered 2023-05-16: 100 mg via INTRAVENOUS

## 2023-05-16 MED ORDER — STERILE WATER FOR IRRIGATION IR SOLN
Status: DC | PRN
  Administered 2023-05-16: 60 mL

## 2023-05-16 MED ORDER — PROPOFOL 10 MG/ML IV BOLUS
INTRAVENOUS | Status: DC | PRN
  Administered 2023-05-16: 50 mg via INTRAVENOUS
  Administered 2023-05-16: 150 ug/kg/min via INTRAVENOUS

## 2023-05-16 MED ORDER — MIDAZOLAM HCL 2 MG/2ML IJ SOLN
INTRAMUSCULAR | Status: AC
  Filled 2023-05-16: qty 2

## 2023-05-16 NOTE — Op Note (Signed)
[facility] Gastroenterology Patient Name: [name] Procedure Date: 05/16/2023 11:20 AM MRN: [id number] Account #: [id number] Date of Birth: [birth year] Admit Type: Outpatient Age: 62 Room: [facility] ENDO ROOM 3 Gender: Male Note Status: Finalized Instrument Name: Peds Colonoscope [id number] Procedure:             Small bowel enteroscopy Indications:           Abnormal video capsule endoscopy, Obscure                         gastrointestinal bleeding Providers:             Wyline Mood MD, MD Medicines:             Monitored Anesthesia Care Complications:         No immediate complications. Procedure:             Pre-Anesthesia Assessment:                        - Prior to the procedure, a History and Physical was                         performed, and patient medications, allergies and                         sensitivities were reviewed. The patient's tolerance                         of previous anesthesia was reviewed.                        - The risks and benefits of the procedure and the                         sedation options and risks were discussed with the                         patient. All questions were answered and informed                         consent was obtained.                        - ASA Grade Assessment: II - A patient with mild                         systemic disease.                        After obtaining informed consent, the endoscope was                         passed under direct vision. Throughout the procedure,                         the patient's blood pressure, pulse, and oxygen                         saturations were monitored continuously. The  Colonoscope was introduced through the mouth and                         advanced to the proximal jejunum. The small bowel                         enteroscopy was accomplished with ease. The patient                         tolerated the procedure well. Findings:       The esophagus was normal.      The stomach was normal.      The examined duodenum was normal.      A few angioectasias with stigmata of recent bleeding were found in the       proximal jejunum. Coagulation for hemostasis using argon plasma at 0.5       liters/minute and 20 watts was successful. Impression:            - Normal esophagus.                        - Normal stomach.                        - Normal examined duodenum.                        - A few recently bleeding angioectasias in the                         jejunum. Treated with argon plasma coagulation (APC).                        - No specimens collected. Recommendation:        - Return patient to hospital ward for ongoing care.                        - Clear liquid diet today.                        - Continue iv ppi                        If has further evidence of bleeding , consider tagged                         rbc scan vs repeat capsule study to rule out distal                         small bowel bleeds Procedure Code(s):     --- Professional ---                        [phone], Small intestinal endoscopy, enteroscopy beyond                         second portion of duodenum, not including ileum; with                         control of bleeding (eg, injection, bipolar cautery,  unipolar cautery, laser, heater probe, stapler, plasma                         coagulator) Diagnosis Code(s):     --- Professional ---                        K55.21, Angiodysplasia of colon with hemorrhage                        K92.2, Gastrointestinal hemorrhage, unspecified                        R93.3, Abnormal findings on diagnostic imaging of                         other parts of digestive tract CPT copyright 2022 American Medical Association. All rights reserved. The codes documented in this report are preliminary and upon coder review may  be revised to meet current compliance requirements. Wyline Mood,  MD Wyline Mood MD, MD 05/16/2023 11:55:44 AM This report has been signed electronically. Number of Addenda: 0 Note Initiated On: 05/16/2023 11:20 AM Estimated Blood Loss:  Estimated blood loss: none.      [facility]

## 2023-05-16 NOTE — Anesthesia Preprocedure Evaluation (Signed)
Anesthesia Evaluation  Patient identified by MRN, date of birth, ID band Patient awake    Reviewed: Allergy & Precautions, H&P , NPO status , Patient's Chart, lab work & pertinent test results, reviewed documented beta blocker date and time   Airway Mallampati: II   Neck ROM: full    Dental  (+) Poor Dentition   Pulmonary COPD, former smoker   Pulmonary exam normal        Cardiovascular Exercise Tolerance: Poor hypertension, + CAD, + Past MI, + Peripheral Vascular Disease and +CHF  Normal cardiovascular exam Rhythm:regular Rate:Normal     Neuro/Psych  Headaches CVA  negative psych ROS   GI/Hepatic Neg liver ROS,GERD  ,,  Endo/Other  negative endocrine ROS    Renal/GU Renal disease  negative genitourinary   Musculoskeletal   Abdominal   Peds  Hematology  (+) Blood dyscrasia, anemia   Anesthesia Other Findings Past Medical History: No date: COPD (chronic obstructive pulmonary disease) (HCC) No date: CVA (cerebral infarction) No date: Headache     Comment:  mild, since stroke 2012 No date: Hypertension 05/09/2012: MI (myocardial infarction) (HCC) No date: Reading difficulty     Comment:  pt reports he reads at "about a second grade level" 2012: Stroke [facility])     Comment:  numbness to left hand  No date: Wears dentures     Comment:  full upper and lower Past Surgical History: No date: CAROTID ENDARTERECTOMY; Left     Comment:  2012 or 2013 07/26/2016: CHOLECYSTECTOMY; N/A     Comment:  Procedure: LAPAROSCOPIC CHOLECYSTECTOMY;  Surgeon:               Lattie Haw, MD;  Location: [facility] ORS;  Service:               General;  Laterality: N/A; 02/09/2022: COLONOSCOPY WITH PROPOFOL; N/A     Comment:  Procedure: COLONOSCOPY WITH PROPOFOL;  Surgeon: Midge Minium, MD;  Location: [facility] ENDOSCOPY;  Service:               Endoscopy;  Laterality: N/A; 05/10/2012: CORONARY ARTERY BYPASS GRAFT     Comment:  3  vessel 05/13/2023: ESOPHAGOGASTRODUODENOSCOPY     Comment:  Procedure: ESOPHAGOGASTRODUODENOSCOPY (EGD);  Surgeon:               Midge Minium, MD;  Location: [facility] ENDOSCOPY;  Service:               Endoscopy;; 10/28/2022: ESOPHAGOGASTRODUODENOSCOPY (EGD) WITH PROPOFOL; N/A     Comment:  Procedure: ESOPHAGOGASTRODUODENOSCOPY (EGD) WITH               PROPOFOL;  Surgeon: Jaynie Collins, DO;  Location:              [facility] ENDOSCOPY;  Service: Gastroenterology;  Laterality:               N/A; 05/13/2023: GIVENS CAPSULE STUDY     Comment:  Procedure: GIVENS CAPSULE STUDY;  Surgeon: Midge Minium,              MD;  Location: [facility] ENDOSCOPY;  Service: Endoscopy;; BMI    Body Mass Index: 28.21 kg/m     Reproductive/Obstetrics negative OB ROS                             Anesthesia Physical Anesthesia Plan  ASA:  3  Anesthesia Plan: General   Post-op Pain Management:    Induction:   PONV Risk Score and Plan:   Airway Management Planned:   Additional Equipment:   Intra-op Plan:   Post-operative Plan:   Informed Consent: I have reviewed the patients History and Physical, chart, labs and discussed the procedure including the risks, benefits and alternatives for the proposed anesthesia with the patient or authorized representative who has indicated his/her understanding and acceptance.     Dental Advisory Given  Plan Discussed with: CRNA  Anesthesia Plan Comments:        Anesthesia Quick Evaluation

## 2023-05-16 NOTE — Progress Notes (Signed)
Reviewed capsule study , active bleeding in proximal small bowel , informed Dr Servando Snare and will be set up for push enteroscope today. Keep NPO  Dr Wyline Mood MD,MRCP [facility]) Gastroenterology/Hepatology Pager: [phone]

## 2023-05-16 NOTE — Transfer of Care (Signed)
Immediate Anesthesia Transfer of Care Note  Patient: [name]  Procedure(s) Performed: ENTEROSCOPY  Patient Location: PACU  Anesthesia Type:General  Level of Consciousness: drowsy  Airway & Oxygen Therapy: Patient Spontanous Breathing and Patient connected to nasal cannula oxygen  Post-op Assessment: Report given to RN and Post -op Vital signs reviewed and stable  Post vital signs: stable  Last Vitals:  Vitals Value Taken Time  BP    Temp    Pulse 79 05/16/23 1156  Resp 12 05/16/23 1156  SpO2 99 % 05/16/23 1156  Vitals shown include unvalidated device data.  Last Pain:  Vitals:   05/16/23 1114  TempSrc: Temporal  PainSc: 0-No pain      Patients Stated Pain Goal: 0 (05/13/23 2304)  Complications: No notable events documented.

## 2023-05-16 NOTE — Progress Notes (Signed)
Mobility Specialist - Progress Note   05/16/23 1518  Mobility  Activity Contraindicated/medical hold   Elevated troponin.  Zetta Bills Mobility Specialist 05/16/23 3:18 PM

## 2023-05-16 NOTE — Progress Notes (Signed)
PROGRESS NOTE    Draylen Rutkowski  YQM:[id number] DOB: 07-06-1961 DOA: 05/13/2023 PCP: Jodi Marble, NP    Brief Narrative:  [name] is a 62 y.o. male with medical history significant for COPD, HTN, CAD s/p CABG 2013,  prior CVA s/p left carotid endarterectomy, cognitive impairment, alcohol use disorder, hospitalized in November 2023 for symptomatic anemia of 6.3 with negative EGD, who presented to the ED with chest pain and weakness and several weeks of dark tarry stools.     Patient's last colonoscopy was in 01/2022 when he had resection of 3 benign polyps.  EGD during his hospitalization in November 2023 was unrevealing.  Review of discharge summary reveals that plans were for possible capsule endoscopy and/or repeat colonoscopy, but it appears that this has not been done.  Patient underwent capsule study on 5/28.  Read by GI.  Active bleeding in small bowel.  Repeat endoscopy performed 5/29.  Area of bleeding identified and cauterized.   Assessment & Plan:   Principal Problem:   ABLA (acute blood loss anemia) Active Problems:   Symptomatic anemia   Gastrointestinal hemorrhage with melena   Coronary artery disease involving coronary bypass graft of native heart without angina pectoris   Chest pain   Elevated troponin   Chronic diastolic CHF (congestive heart failure) (HCC)   Hyponatremia   Hypertension   Cognitive impairment   History of stroke s/p left carotid endarterectomy   Alcohol use disorder  Melena Symptomatic anemia, suspect acute on chronic GI blood loss Hemoglobin 6.2 down from 12.5 a couple months prior. --s/p 2 units PRBCs --repeat EGD wnl Capsule study positive, small bowel bleeding Endoscopy successful on 5/29 Plan: Liquid diet today.  Advance starting tomorrow.  Repeat H&H in AM.   Coronary artery disease involving coronary bypass graft of native heart without angina pectoris Chest pain with elevated troponin History of CABG Chest pain, troponin of  19, 25 on presentation but EKG nonischemic.  Trop increased to 247 the next day, can still be demand, but consulted cardio given pt's hx of CAD. -- Cardiology signed off.  Recommendations appreciated.  No further diagnostics   Chronic diastolic CHF (congestive heart failure) (HCC)  EF 60-65%, Gr II DD 10/17/2022 --cont home Toprol   Hyponatremia, chronic   Alcohol use disorder --pt said he doesn't drink a lot.  No signs of withdrawal.     History of stroke s/p left carotid endarterectomy --cont home ASA and statin   Cognitive impairment --cont home donepezil   Hypertension --cont home Toprol   DVT prophylaxis: SCD Code Status: Full Family Communication: None Disposition Plan: Status is: Inpatient Remains inpatient appropriate because: GI bleed.  Tentative plan discharge 5/30   Level of care: Med-Surg  Consultants:  GI  Procedures:  EGD x 2  Antimicrobials: None   Subjective: Seen and examined.  Resting in bed.  No distress.  Objective: Vitals:   05/16/23 0809 05/16/23 1114 05/16/23 1156 05/16/23 1210  BP: 121/64 110/73 (!) 96/51 119/76  Pulse: 85 70    Resp: 16 16    Temp:  (!) 96.9 F (36.1 C) 98.6 F (37 C)   TempSrc:  Temporal Temporal   SpO2: 100% 100%    Weight:      Height:        Intake/Output Summary (Last 24 hours) at 05/16/2023 1355 Last data filed at 05/16/2023 1000 Gross per 24 hour  Intake 1010 ml  Output 1600 ml  Net -590 ml   [employer]  05/13/23 0002  Weight: 94.3 kg    Examination:  General exam: NAD.  Appears fatigued Respiratory system: Clear to auscultation. Respiratory effort normal. Cardiovascular system: S1-S2, RRR, no murmurs, no pedal edema Gastrointestinal system: Soft, NT/ND, normal bowel sounds Central nervous system: Alert and oriented. No focal neurological deficits. Extremities: Symmetric 5 x 5 power. Skin: No rashes, lesions or ulcers Psychiatry: Judgement and insight appear normal. Mood & affect  appropriate.     Data Reviewed: I have personally reviewed following labs and imaging studies  CBC: Recent Labs  Lab 05/13/23 0005 05/13/23 0848 05/13/23 2124 05/14/23 1023 05/15/23 0432 05/16/23 0416  WBC 11.9* 10.2  --  14.5* 13.0* 11.0*  HGB 6.2* 7.7* 8.3* 8.3* 9.1* 7.8*  HCT 20.5* 25.3* 25.8* 26.6* 30.0* 25.3*  MCV 82.0 81.6  --  80.1 82.2 81.6  PLT 358 379  --  343 395 333   Basic Metabolic Panel: Recent Labs  Lab 05/13/23 0005 05/14/23 1023 05/15/23 0432 05/16/23 0416  NA 129* 131* 133* 137  K 3.6 3.7 3.7 4.0  CL 97* 98 96* 102  CO2 21* 26 27 28   GLUCOSE 140* 120* 116* 139*  BUN 8 10 14 15   CREATININE 0.91 0.91 1.11 1.00  CALCIUM 8.3* 8.2* 8.6* 8.5*  MG  --  1.9 2.2 2.2   GFR: Estimated Creatinine Clearance: 89 mL/min (by C-G formula based on SCr of 1 mg/dL). Liver Function Tests: No results for input(s): "AST", "ALT", "ALKPHOS", "BILITOT", "PROT", "ALBUMIN" in the last 168 hours. No results for input(s): "LIPASE", "AMYLASE" in the last 168 hours. No results for input(s): "AMMONIA" in the last 168 hours. Coagulation Profile: No results for input(s): "INR", "PROTIME" in the last 168 hours. Cardiac Enzymes: No results for input(s): "CKTOTAL", "CKMB", "CKMBINDEX", "TROPONINI" in the last 168 hours. BNP (last 3 results) No results for input(s): "PROBNP" in the last 8760 hours. HbA1C: No results for input(s): "HGBA1C" in the last 72 hours. CBG: No results for input(s): "GLUCAP" in the last 168 hours. Lipid Profile: No results for input(s): "CHOL", "HDL", "LDLCALC", "TRIG", "CHOLHDL", "LDLDIRECT" in the last 72 hours. Thyroid Function Tests: No results for input(s): "TSH", "T4TOTAL", "FREET4", "T3FREE", "THYROIDAB" in the last 72 hours. Anemia Panel: No results for input(s): "VITAMINB12", "FOLATE", "FERRITIN", "TIBC", "IRON", "RETICCTPCT" in the last 72 hours. Sepsis Labs: No results for input(s): "PROCALCITON", "LATICACIDVEN" in the last 168 hours.  No  results found for this or any previous visit (from the past 240 hour(s)).       Radiology Studies: ECHOCARDIOGRAM COMPLETE  Result Date: 05/16/2023    ECHOCARDIOGRAM REPORT   Patient Name:   Trigg Mcphillips Date of Exam: 05/16/2023 Medical Rec #:  [id number]    Height:       72.0 in Accession #:    [id number]   Weight:       208.0 lb Date of Birth:  [birth year]     BSA:          2.166 m Patient Age:    62 years     BP:           121/64 mmHg Patient Gender: M            HR:           85 bpm. Exam Location:  [facility] Procedure: 2D Echo, Cardiac Doppler and Color Doppler Indications:     Aortic stenosis I35.0  History:         Patient has prior history of Echocardiogram examinations,  most                  recent 10/17/2022. Previous Myocardial Infarction, COPD; Risk                  Factors:Hypertension.  Sonographer:     Cristela Blue Referring Phys:  [id number] [facility] MICHELLE TANG Diagnosing Phys: Marcina Millard MD IMPRESSIONS  1. Left ventricular ejection fraction, by estimation, is 60 to 65%. The left ventricle has normal function. The left ventricle has no regional wall motion abnormalities. There is mild left ventricular hypertrophy. Left ventricular diastolic parameters were normal.  2. Right ventricular systolic function is normal. The right ventricular size is normal.  3. The mitral valve is normal in structure. Mild mitral valve regurgitation. No evidence of mitral stenosis.  4. The aortic valve is normal in structure. Aortic valve regurgitation is not visualized. Moderate to severe aortic valve stenosis.  5. The inferior vena cava is normal in size with greater than 50% respiratory variability, suggesting right atrial pressure of 3 mmHg. FINDINGS  Left Ventricle: Left ventricular ejection fraction, by estimation, is 60 to 65%. The left ventricle has normal function. The left ventricle has no regional wall motion abnormalities. The left ventricular internal cavity size was normal in size. There is  mild left  ventricular hypertrophy. Left ventricular diastolic parameters were normal. Right Ventricle: The right ventricular size is normal. No increase in right ventricular wall thickness. Right ventricular systolic function is normal. Left Atrium: Left atrial size was normal in size. Right Atrium: Right atrial size was normal in size. Pericardium: There is no evidence of pericardial effusion. Mitral Valve: The mitral valve is normal in structure. Mild mitral valve regurgitation. No evidence of mitral valve stenosis. MV peak gradient, 8.5 mmHg. The mean mitral valve gradient is 3.0 mmHg. Tricuspid Valve: The tricuspid valve is normal in structure. Tricuspid valve regurgitation is mild . No evidence of tricuspid stenosis. Aortic Valve: The aortic valve is normal in structure. Aortic valve regurgitation is not visualized. Moderate to severe aortic stenosis is present. Aortic valve mean gradient measures 22.0 mmHg. Aortic valve peak gradient measures 37.5 mmHg. Aortic valve  area, by VTI measures 0.89 cm. Pulmonic Valve: The pulmonic valve was normal in structure. Pulmonic valve regurgitation is not visualized. No evidence of pulmonic stenosis. Aorta: The aortic root is normal in size and structure. Venous: The inferior vena cava is normal in size with greater than 50% respiratory variability, suggesting right atrial pressure of 3 mmHg. IAS/Shunts: No atrial level shunt detected by color flow Doppler.  LEFT VENTRICLE PLAX 2D LVIDd:         4.10 cm   Diastology LVIDs:         2.60 cm   LV e' medial:    6.20 cm/s LV PW:         1.00 cm   LV E/e' medial:  20.6 LV IVS:        1.30 cm   LV e' lateral:   8.59 cm/s LVOT diam:     2.00 cm   LV E/e' lateral: 14.9 LV SV:         58 LV SV Index:   27 LVOT Area:     3.14 cm  RIGHT VENTRICLE RV Basal diam:  4.00 cm RV Mid diam:    3.20 cm RV S prime:     9.03 cm/s TAPSE (M-mode): 1.6 cm LEFT ATRIUM  Index        RIGHT ATRIUM           Index LA diam:        3.60 cm 1.66 cm/m    RA Area:     12.70 cm LA Vol (A2C):   45.7 ml 21.10 ml/m  RA Volume:   27.30 ml  12.60 ml/m LA Vol (A4C):   47.7 ml 22.02 ml/m LA Biplane Vol: 47.0 ml 21.70 ml/m  AORTIC VALVE AV Area (Vmax):    0.80 cm AV Area (Vmean):   0.86 cm AV Area (VTI):     0.89 cm AV Vmax:           306.33 cm/s AV Vmean:          216.000 cm/s AV VTI:            0.650 m AV Peak Grad:      37.5 mmHg AV Mean Grad:      22.0 mmHg LVOT Vmax:         78.20 cm/s LVOT Vmean:        58.800 cm/s LVOT VTI:          0.185 m LVOT/AV VTI ratio: 0.28  AORTA Ao Root diam: 2.80 cm MITRAL VALVE                TRICUSPID VALVE MV Area (PHT): 2.72 cm     TR Peak grad:   19.4 mmHg MV Area VTI:   1.80 cm     TR Vmax:        220.00 cm/s MV Peak grad:  8.5 mmHg MV Mean grad:  3.0 mmHg     SHUNTS MV Vmax:       1.46 m/s     Systemic VTI:  0.18 m MV Vmean:      75.7 cm/s    Systemic Diam: 2.00 cm MV Decel Time: 279 msec MV E velocity: 128.00 cm/s MV A velocity: 119.00 cm/s MV E/A ratio:  1.08 Marcina Millard MD Electronically signed by Marcina Millard MD Signature Date/Time: 05/16/2023/1:07:04 PM    Final         Scheduled Meds:  aspirin EC  81 mg Oral Daily   donepezil  10 mg Oral Daily   enoxaparin (LOVENOX) injection  40 mg Subcutaneous Q24H   escitalopram  5 mg Oral Daily   fluticasone furoate-vilanterol  1 puff Inhalation Daily   folic acid  1 mg Oral Daily   LORazepam  0-4 mg Intravenous Q12H   metoprolol succinate  25 mg Oral Daily   mirtazapine  15 mg Oral QHS   multivitamin with minerals  1 tablet Oral Daily   thiamine  100 mg Oral Daily   Or   thiamine  100 mg Intravenous Daily   Continuous Infusions:   LOS: 3 days     Tresa Moore, MD [facility]   If 7PM-7AM, please contact night-coverage  05/16/2023, 1:55 PM

## 2023-05-16 NOTE — Progress Notes (Signed)
*  PRELIMINARY RESULTS* Echocardiogram 2D Echocardiogram has been performed.  Cristela Blue 05/16/2023, 10:57 AM

## 2023-05-16 NOTE — TOC Progression Note (Signed)
Transition of Care [facility]) - Progression Note    Patient Details  Name: Tyge Nearhoof MRN: [id number] Date of Birth: March 20, 1961  Transition of Care [facility]) CM/SW Contact  Darleene Cleaver, [place] Phone Number: 05/16/2023, 11:18 PM  Clinical Narrative:     CSW received phone call from [facility].  Per [facility], they have been working with Laurena Bering to find placement for this patient because he is homeless.  CSW informed them that the hospital does not always place patient in LTC facilities, and focus on short term rehab.  CSW informed [facility] that [facility] does not typically look for long term placement.  Since patient is being followed by Sharlyn Bologna will discuss with Laurena Bering rep what placement options have been started.  TOC to follow up with [facility] to discuss disposition plane for patient.        Expected Discharge Plan and Services    ALF or group home.                                           Social Determinants of Health (SDOH) Interventions SDOH Screenings   Food Insecurity: Food Insecurity Present (05/15/2023)  Housing: High Risk (05/15/2023)  Transportation Needs: No Transportation Needs (05/15/2023)  Utilities: Not At Risk (05/15/2023)  Tobacco Use: High Risk (05/16/2023)    Readmission Risk Interventions    10/27/2022   11:28 AM  Readmission Risk Prevention Plan  Transportation Screening Complete  PCP or Specialist Appt within 5-7 Days Complete  Home Care Screening Complete  Medication Review (RN CM) Complete

## 2023-05-16 NOTE — Care Management Important Message (Signed)
Important Message  Patient Details  Name: [name] MRN: [id number] Date of Birth: 1961/06/26   Medicare Important Message Given:  Yes     [name] 05/16/2023, 10:12 AM

## 2023-05-16 NOTE — Consult Note (Signed)
[facility] CLINIC CARDIOLOGY CONSULT NOTE       Patient ID: [name] MRN: [id number] DOB/AGE: [birth year] 62 y.o.  Admit date: 05/13/2023 Referring Physician Dr. Darlin Priestly Primary Physician  Primary Cardiologist previous Dr. Gwen Pounds  Reason for Consultation chest pain   HPI: Kaylyn Tafur is a [phone] with a PMH of 3vCAD w/ hx inferior STEMI (2013) s/p PCI with stent RCA + CABG x 3 ([facility]), moderate-severe aortic stenosis (10/2022), HFpEF, COPD, hx carotid endarterectomy, hx upper GI bleed 2023 who presented to [facility] ED 05/13/2023 with chest pain, and presyncopal symptoms.  Hemoglobin on presentation was markedly reduced at 6.2, requiring multiple units of PRBCs.  Cardiology is consulted for assistance with his chest pain.  The patient provides the entirety of the history, though appears to have fairly poor medical literacy.  He states that 2 days prior to his presentation to the emergency department he had central chest pressure that radiated down both of his arms that was constant in nature.  He had significant dyspnea with minimal activity, just getting out of his car would cause him to be short of breath.  Admits to lightheadedness and dizziness as well.  Also has occasional lower extremity edema that sometimes resolves with elevation of his lower legs.  He called EMS for further assistance and he was given 325 mg of aspirin which resolved his chest discomfort. In the emergency department his H&H was checked which was markedly low at 6.2/20.5 compared to a hemoglobin of 12 when checked 2 months ago.  He was also having dark stools for a period of time now as well.  Earlier in admission he had an upper endoscopy performed that showed an irregular Z-line but did not show any active bleeding in the stomach or proximal small bowel.  The patient subsequently had a capsule endoscopy performed which showed active bleeding in the proximal small bowel for which repeat endoscopy is scheduled for later today.  At  my time of evaluation he is laying nearly flat in bed laying on his right side, currently chest pain-free.  He still feels not quite like himself but perhaps a little bit better than when he first presented.  Recent vitals are notable for a blood pressure of 110/73, he is in sinus rhythm on telemetry with heart rate in the 70s to 90s.  He is comfortable on room air saturating well.  Labs notable for high-sensitivity troponin elevated and flat trending at 247, 248.  BNP checked earlier in admission slightly elevated at 899.9.  Potassium today 4.0, BUN/creatinine 15/1.0 and GFR >60.  H&H today with drift from 9.1/30 down to 7.8/25.3 today.  Review of systems complete and found to be negative unless listed above     Past Medical History:  Diagnosis Date   COPD (chronic obstructive pulmonary disease) (HCC)    CVA (cerebral infarction)    Headache    mild, since stroke 2012   Hypertension    MI (myocardial infarction) (HCC) 05/09/2012   Reading difficulty    pt reports he reads at "about a second grade level"   Stroke [facility]) 2012   numbness to left hand    Wears dentures    full upper and lower    Past Surgical History:  Procedure Laterality Date   CAROTID ENDARTERECTOMY Left    2012 or 2013   CHOLECYSTECTOMY N/A 07/26/2016   Procedure: LAPAROSCOPIC CHOLECYSTECTOMY;  Surgeon: Lattie Haw, MD;  Location: [facility] ORS;  Service: General;  Laterality: N/A;   COLONOSCOPY  WITH PROPOFOL N/A 02/09/2022   Procedure: COLONOSCOPY WITH PROPOFOL;  Surgeon: Midge Minium, MD;  Location: [facility] ENDOSCOPY;  Service: Endoscopy;  Laterality: N/A;   CORONARY ARTERY BYPASS GRAFT  05/10/2012   3 vessel   ESOPHAGOGASTRODUODENOSCOPY  05/13/2023   Procedure: ESOPHAGOGASTRODUODENOSCOPY (EGD);  Surgeon: Midge Minium, MD;  Location: [facility] ENDOSCOPY;  Service: Endoscopy;;   ESOPHAGOGASTRODUODENOSCOPY (EGD) WITH PROPOFOL N/A 10/28/2022   Procedure: ESOPHAGOGASTRODUODENOSCOPY (EGD) WITH PROPOFOL;  Surgeon: Jaynie Collins, DO;  Location: [facility] ENDOSCOPY;  Service: Gastroenterology;  Laterality: N/A;   GIVENS CAPSULE STUDY  05/13/2023   Procedure: GIVENS CAPSULE STUDY;  Surgeon: Midge Minium, MD;  Location: [facility] ENDOSCOPY;  Service: Endoscopy;;    Medications Prior to Admission  Medication Sig Dispense Refill Last Dose   albuterol (VENTOLIN HFA) 108 (90 Base) MCG/ACT inhaler Inhale 2 puffs into the lungs every 4 (four) hours as needed for wheezing or shortness of breath. 2 each 11 05/12/2023   amLODipine (NORVASC) 5 MG tablet Take 5 mg by mouth daily.   05/12/2023   aspirin EC (ASPIRIN LOW DOSE) 81 MG tablet Take 81 mg by mouth daily.   05/12/2023   cetirizine (ZYRTEC) 10 MG tablet Take 10 mg by mouth daily.   05/12/2023   donepezil (ARICEPT) 10 MG tablet Take 10 mg by mouth daily.    05/12/2023 at even   escitalopram (LEXAPRO) 5 MG tablet Take 5 mg by mouth daily.   05/12/2023   fluticasone (FLONASE) 50 MCG/ACT nasal spray Place 2 sprays into both nostrils daily.   05/12/2023   Melatonin 10 MG CAPS Take 1 capsule by mouth at bedtime.   05/12/2023   metoprolol succinate (TOPROL-XL) 25 MG 24 hr tablet Take 25 mg by mouth daily.   05/12/2023   mirtazapine (REMERON) 15 MG tablet Take 15 mg by mouth at bedtime.   05/12/2023   naltrexone (DEPADE) 50 MG tablet Take 50 mg by mouth daily.   05/12/2023   omeprazole (PRILOSEC) 20 MG capsule Take 1 capsule (20 mg total) by mouth 2 (two) times daily before a meal. 60 capsule 0 05/12/2023   pravastatin (PRAVACHOL) 20 MG tablet Take 20 mg by mouth at bedtime.    05/12/2023   vitamin B-12 (CYANOCOBALAMIN) 1000 MCG tablet Take 1 tablet (1,000 mcg total) by mouth daily. 30 tablet 0 05/12/2023   albuterol (VENTOLIN HFA) 108 (90 Base) MCG/ACT inhaler Inhale 2 puffs into the lungs every 6 (six) hours as needed for wheezing or shortness of breath. 8 g 2    TRELEGY ELLIPTA 100-62.5-25 MCG/INH AEPB Inhale 1 puff into the lungs daily.      Social History   Socioeconomic History   Marital  status: Divorced    Spouse name: Not on file   Number of children: Not on file   Years of education: Not on file   Highest education level: Not on file  Occupational History   Not on file  Tobacco Use   Smoking status: Former    Packs/day: 4.00    Years: 43.00    Additional pack years: 0.00    Total pack years: 172.00    Types: Cigarettes    Quit date: 75    Years since quitting: 31.4   Smokeless tobacco: Current    Types: Snuff   Tobacco comments:    changed to dip  Vaping Use   Vaping Use: Never used  Substance and Sexual Activity   Alcohol use: Yes    Alcohol/week: 26.0 standard drinks of alcohol  Types: 26 Cans of beer per week   Drug use: No   Sexual activity: Yes    Birth control/protection: None  Other Topics Concern   Not on file  Social History Narrative   Not on file   Social Determinants of Health   Financial Resource Strain: Not on file  Food Insecurity: Food Insecurity Present (05/15/2023)   Hunger Vital Sign    Worried About Running Out of Food in the Last Year: Sometimes true    Ran Out of Food in the Last Year: Sometimes true  Transportation Needs: No Transportation Needs (05/15/2023)   PRAPARE - Administrator, Civil Service (Medical): No    Lack of Transportation (Non-Medical): No  Physical Activity: Not on file  Stress: Not on file  Social Connections: Not on file  Intimate Partner Violence: Not At Risk (05/15/2023)   Humiliation, Afraid, Rape, and Kick questionnaire    Fear of Current or Ex-Partner: No    Emotionally Abused: No    Physically Abused: No    Sexually Abused: No    Family History  Problem Relation Age of Onset   Pneumonia Mother       Intake/Output Summary (Last 24 hours) at 05/16/2023 0847 Last data filed at 05/16/2023 0700 Gross per 24 hour  Intake 1200 ml  Output 1550 ml  Net -350 ml    Vitals:   05/15/23 1504 05/15/23 1521 05/15/23 1959 05/16/23 0809  BP: (!) 86/48 118/75 112/70 121/64  Pulse: 80 85  84 85  Resp: 18  18 16   Temp: 98.2 F (36.8 C)  98.3 F (36.8 C)   TempSrc: Oral  Oral   SpO2: 100% 100% 100% 100%  Weight:      Height:        PHYSICAL EXAM General: pleasant middle aged male , well nourished, in no acute distress. Laying at low incline in bed. HEENT:  Normocephalic and atraumatic. Neck:  No JVD.  Lungs: Normal respiratory effort on room air.  Decreased breath sounds without appreciable crackles or wheezes. Heart: HRRR . Normal S1 and S2.  3/6 high-pitched systolic murmur heard throughout with radiation to right carotid. Abdomen: Non-distended appearing with excess adiposity.  Msk: Normal strength and tone for age. Extremities: Warm and well perfused. No clubbing, cyanosis.  No peripheral edema.  Neuro: Alert and oriented X 3. Psych:  Answers questions appropriately.   Labs: Basic Metabolic Panel: Recent Labs    05/15/23 0432 05/16/23 0416  NA 133* 137  K 3.7 4.0  CL 96* 102  CO2 27 28  GLUCOSE 116* 139*  BUN 14 15  CREATININE 1.11 1.00  CALCIUM 8.6* 8.5*  MG 2.2 2.2   Liver Function Tests: No results for input(s): "AST", "ALT", "ALKPHOS", "BILITOT", "PROT", "ALBUMIN" in the last 72 hours. No results for input(s): "LIPASE", "AMYLASE" in the last 72 hours. CBC: Recent Labs    05/15/23 0432 05/16/23 0416  WBC 13.0* 11.0*  HGB 9.1* 7.8*  HCT 30.0* 25.3*  MCV 82.2 81.6  PLT 395 333   Cardiac Enzymes: Recent Labs    05/14/23 1023 05/14/23 1720  TROPONINIHS 247* 248*   BNP: Recent Labs    05/13/23 2208  BNP 899.9*   D-Dimer: No results for input(s): "DDIMER" in the last 72 hours. Hemoglobin A1C: No results for input(s): "HGBA1C" in the last 72 hours. Fasting Lipid Panel: No results for input(s): "CHOL", "HDL", "LDLCALC", "TRIG", "CHOLHDL", "LDLDIRECT" in the last 72 hours. Thyroid Function Tests: No results  for input(s): "TSH", "T4TOTAL", "T3FREE", "THYROIDAB" in the last 72 hours.  Invalid input(s): "FREET3" Anemia Panel: No  results for input(s): "VITAMINB12", "FOLATE", "FERRITIN", "TIBC", "IRON", "RETICCTPCT" in the last 72 hours.   Radiology: [facility] Chest Port 1 View  Result Date: 05/13/2023 CLINICAL DATA:  [id number] Dyspnea [id number] EXAM: PORTABLE CHEST 1 VIEW COMPARISON:  Chest x-ray 05/13/2023 FINDINGS: The heart and mediastinal contours are unchanged. Aortic calcification. No focal consolidation. No pulmonary edema. No pleural effusion. No pneumothorax. No acute osseous abnormality.  Sternotomy wires are intact. IMPRESSION: 1. No active disease. 2.  Aortic Atherosclerosis (ICD10-I70.0). Electronically Signed   By: Tish Frederickson M.D.   On: 05/13/2023 22:15   DG Chest 2 View  Result Date: 05/13/2023 CLINICAL DATA:  Chest pain EXAM: CHEST - 2 VIEW COMPARISON:  03/13/2023 FINDINGS: Prior CABG. There is hyperinflation of the lungs compatible with COPD. Heart and mediastinal contours are within normal limits. No focal opacities or effusions. No acute bony abnormality. IMPRESSION: COPD.  No active disease. Electronically Signed   By: Charlett Nose M.D.   On: 05/13/2023 00:28    CTS 2013   1 PDA      Saphenous  Fair     Good     8/0     Running         Distal                 vein                                                 first    2 OM 3     Saphenous  Fair     Good     8/0     Running         Distal                 vein                                                 first    3 Mid LAD  R-IMA      Good     Good     8/0     Running  ------------------------------------------------------------------------------   Distal     Proxim-  Proximal   Mean               al       Technique  Flow               Suture    ---------  -------  ---------  ----    1 PDA      6/0      Running    2 OM 3     6/0      Running    3 Mid LAD           In Situ   SANDFORD, BADGLEY  MRN: [id number]  DOB: Apr 02, 1961  IP  Procedure Date: 05/09/2012  Cine #: [id number]  Primary Care Provider: [facility], MD   Kobee Patty underwent the following  procedures:    In situ IMA arteriogram    Balloon PTCA - primary vessel    Stent -  primary vessel    Aortic pressure measurement    Coronary angiogram - left    Coronary angiogram - right    Left heart catheterization    Left ventriculogram   INDICATION(S) FOR ENCOUNTER (*indicates primary reason for catheterization)    * 410.21 - AMI, Inferolateral, initial episode of care    401.1 - Benign Essential Hypertension    786.05 - Shortness of breath    794.31 - Abnormal EKG   ENCOUNTER CATEGORY    Emergency cath - primary PCI   DIAGNOSTIC SUMMARY    Coronary Artery Disease      Left Main:  significant      LAD system:  insignificant      LCX system:  significant      RCA system:  total      Number of vessels with significant CAD:  3 - Vessel    Left Ventriculogram      Ejection Fraction:   57%   INTERVENTIONAL SUMMARY    Lesions Attempted:  1      Lesion #1:   Prox RCA 100%  (pre) to 10%  (post)   FINAL DIAGNOSIS    410.21  AMI, Inferolateral, initial episode of care    794.31  Abnormal EKG    786.05  Shortness of breath    401.1  Benign Essential Hypertension   COMMENT   100% pRCA    JR 4 guide, Prowater wire    PTA 2.0 x 12mm Sprinter    Stented with Integrity BMS 2.75 x 22 at 16atm       Did not give thienopyridine. PCI of RCA, with critical LM stenosis and prox      RCA (needs CABG). Procedure done on heparin and Integrillin)     LIMA in situ patent     __________________________________     __________________________________    Wallace Keller, MD                     Glennie Isle, MD   ECHO  10/17/2022 1. Left ventricular ejection fraction, by estimation, is 60 to 65%. The  left ventricle has normal function. Left ventricular endocardial border  not optimally defined to evaluate regional wall motion. Left ventricular  diastolic parameters are consistent  with Grade II diastolic dysfunction (pseudonormalization). Elevated left  atrial pressure.   2.  Right ventricular systolic function is mildly reduced. The right  ventricular size is mildly enlarged.   3. The mitral valve was not well visualized. No evidence of mitral valve  regurgitation. Mild mitral stenosis. Moderate to severe mitral annular  calcification.   4. The aortic valve was not well visualized. There is severe calcifcation  of the aortic valve. There is severe thickening of the aortic valve.  Aortic valve regurgitation is not visualized. Moderate aortic valve  stenosis. Aortic valve mean gradient  measures 28.0 mmHg. Aortic valve Vmax measures 3.59 m/s.   5. The inferior vena cava is dilated in size with <50% respiratory  variability, suggesting right atrial pressure of 15 mmHg.   6. Agitated saline contrast bubble study was negative, with no evidence  of any interatrial shunt.   TELEMETRY reviewed by me (LT) 05/16/2023 : NSR 70-90s  EKG reviewed by me: sinus tach rate 110s without ischemic changes  Data reviewed by me (LT) 05/16/2023: ed note, GI note, admission H&P, hospitalist progress note, last 24h vitals tele labs imaging I/O    Principal Problem:   ABLA (acute blood  loss anemia) Active Problems:   Coronary artery disease involving coronary bypass graft of native heart without angina pectoris   Hypertension   Cognitive impairment   History of stroke s/p left carotid endarterectomy   Symptomatic anemia   Melena   Hyponatremia   Alcohol use disorder   Chest pain   Elevated troponin   Chronic diastolic CHF (congestive heart failure) (HCC)    ASSESSMENT AND PLAN:  Lyndel Suydam is a 53yoM with a PMH of 3vCAD w/ hx inferior STEMI (2013) s/p PCI with stent RCA + CABG x 3 ([facility]), moderate-severe aortic stenosis (10/2022), HFpEF, COPD, hx carotid endarterectomy, hx upper GI bleed 2023 who presented to [facility] ED 05/13/2023 with chest pain, and presyncopal symptoms.  Hemoglobin on presentation was markedly reduced at 6.2, requiring multiple units of PRBCs.  Cardiology is  consulted for assistance with his chest pain.  # Symptomatic anemia # Upper GI bleed Presented with a hemoglobin of 6.2, down from baseline of 12 2 months ago.  Admits to melena prior to admission.  Initial negative upper endoscopy, capsule endoscopy showed active bleeding in the proximal small bowel. Going for repeat enteroscope today with GI.   # Demand ischemia # 3v CAD s/p CABG x 3 (2013) Initially presented with central chest pressure that radiated to both arms that resolved with administration of 4 baby aspirin by EMS.  No recurrence of chest pressure during admission, has also received multiple units of PRBCs.  Troponins are elevated and flat trending at 247, 248.  EKG showing sinus tachycardia without acute ischemic changes.  Troponin elevation is most consistent with demand/supply mismatch from severe blood loss anemia and not ACS.  -Continue aspirin 81 mg daily, indefinitely -Continue metoprolol succinate 25 mg daily -Continue pravastatin  # Moderate-severe aortic stenosis Progressive aortic stenosis by echo most recently performed in November 2023.  Repeat echo performed earlier today, pending read.   -Stressed importance of outpatient follow-up with cardiology for close follow-up regarding this, and eventual plan for valve replacement based on severity of AS.  # Tobacco abuse # Alcohol use disorder Encouraged complete cessation.  Cardiology will sign off. Please haiku with questions or re-engage if needed.    This patient's plan of care was discussed and created with Dr. Darrold Junker and he is in agreement.  Signed: Rebeca Allegra , PA-C 05/16/2023, 8:47 AM [facility]

## 2023-05-16 NOTE — Anesthesia Postprocedure Evaluation (Signed)
Anesthesia Post Note  Patient: [name]  Procedure(s) Performed: ENTEROSCOPY  Patient location during evaluation: PACU Anesthesia Type: General Level of consciousness: awake and alert Pain management: pain level controlled Vital Signs Assessment: post-procedure vital signs reviewed and stable Respiratory status: spontaneous breathing, nonlabored ventilation, respiratory function stable and patient connected to nasal cannula oxygen Cardiovascular status: blood pressure returned to baseline and stable Postop Assessment: no apparent nausea or vomiting Anesthetic complications: no   There were no known notable events for this encounter.   Last Vitals:  Vitals:   05/16/23 1156 05/16/23 1210  BP: (!) 96/51 119/76  Pulse:    Resp:    Temp: 37 C   SpO2:      Last Pain:  Vitals:   05/16/23 1210  TempSrc:   PainSc: 0-No pain                 Yevette Edwards

## 2023-05-16 NOTE — H&P (Signed)
Wyline Mood, MD [address], [address], [place], [place], [address] [address], [place], [place], [address] Phone: [phone]  Fax: [phone]  Primary Care Physician:  Jodi Marble, NP   Pre-Procedure History & Physical: HPI:  [name] is a 62 y.o. male is here for an push enteroscopy   Past Medical History:  Diagnosis Date   COPD (chronic obstructive pulmonary disease) (HCC)    CVA (cerebral infarction)    Headache    mild, since stroke 2012   Hypertension    MI (myocardial infarction) (HCC) 05/09/2012   Reading difficulty    pt reports he reads at "about a second grade level"   Stroke [facility]) 2012   numbness to left hand    Wears dentures    full upper and lower    Past Surgical History:  Procedure Laterality Date   CAROTID ENDARTERECTOMY Left    2012 or 2013   CHOLECYSTECTOMY N/A 07/26/2016   Procedure: LAPAROSCOPIC CHOLECYSTECTOMY;  Surgeon: Lattie Haw, MD;  Location: [facility] ORS;  Service: General;  Laterality: N/A;   COLONOSCOPY WITH PROPOFOL N/A 02/09/2022   Procedure: COLONOSCOPY WITH PROPOFOL;  Surgeon: Midge Minium, MD;  Location: [facility] ENDOSCOPY;  Service: Endoscopy;  Laterality: N/A;   CORONARY ARTERY BYPASS GRAFT  05/10/2012   3 vessel   ESOPHAGOGASTRODUODENOSCOPY  05/13/2023   Procedure: ESOPHAGOGASTRODUODENOSCOPY (EGD);  Surgeon: Midge Minium, MD;  Location: [facility] ENDOSCOPY;  Service: Endoscopy;;   ESOPHAGOGASTRODUODENOSCOPY (EGD) WITH PROPOFOL N/A 10/28/2022   Procedure: ESOPHAGOGASTRODUODENOSCOPY (EGD) WITH PROPOFOL;  Surgeon: Jaynie Collins, DO;  Location: [facility] ENDOSCOPY;  Service: Gastroenterology;  Laterality: N/A;   GIVENS CAPSULE STUDY  05/13/2023   Procedure: GIVENS CAPSULE STUDY;  Surgeon: Midge Minium, MD;  Location: [facility] ENDOSCOPY;  Service: Endoscopy;;    Prior to Admission medications   Medication Sig Start Date End Date Taking? Authorizing Provider  albuterol (VENTOLIN HFA) 108 (90 Base) MCG/ACT inhaler  Inhale 2 puffs into the lungs every 4 (four) hours as needed for wheezing or shortness of breath. 08/14/22  Yes Dgayli, Lianne Bushy, MD  amLODipine (NORVASC) 5 MG tablet Take 5 mg by mouth daily. 04/27/23  Yes [provider]  aspirin EC (ASPIRIN LOW DOSE) 81 MG tablet Take 81 mg by mouth daily. 04/27/23  Yes [provider]  cetirizine (ZYRTEC) 10 MG tablet Take 10 mg by mouth daily. 08/04/22  Yes [provider]  donepezil (ARICEPT) 10 MG tablet Take 10 mg by mouth daily.    Yes [provider]  escitalopram (LEXAPRO) 5 MG tablet Take 5 mg by mouth daily. 04/19/23  Yes [provider]  fluticasone (FLONASE) 50 MCG/ACT nasal spray Place 2 sprays into both nostrils daily. 08/04/22  Yes [provider]  Melatonin 10 MG CAPS Take 1 capsule by mouth at bedtime. 08/04/22  Yes [provider]  metoprolol succinate (TOPROL-XL) 25 MG 24 hr tablet Take 25 mg by mouth daily. 05/24/21  Yes [provider]  mirtazapine (REMERON) 15 MG tablet Take 15 mg by mouth at bedtime. 05/24/21  Yes [provider]  naltrexone (DEPADE) 50 MG tablet Take 50 mg by mouth daily. 04/16/23  Yes [provider]  omeprazole (PRILOSEC) 20 MG capsule Take 1 capsule (20 mg total) by mouth 2 (two) times daily before a meal. 06/05/21  Yes Rolly Salter, MD  pravastatin (PRAVACHOL) 20 MG tablet Take 20 mg by mouth at bedtime.    Yes [provider]  vitamin B-12 (CYANOCOBALAMIN)  1000 MCG tablet Take 1 tablet (1,000 mcg total) by mouth daily. 06/05/21  Yes Rolly Salter, MD  albuterol (VENTOLIN HFA) 108 (90 Base) MCG/ACT inhaler Inhale 2 puffs into the lungs every 6 (six) hours as needed for wheezing or shortness of breath. 03/13/23   Willy Eddy, MD  TRELEGY ELLIPTA 100-62.5-25 MCG/INH AEPB Inhale 1 puff into the lungs daily. 05/24/21   [provider]    Allergies as of 05/12/2023 - Review Complete 03/13/2023  Allergen Reaction Noted    Other  01/17/2022    Family History  Problem Relation Age of Onset   Pneumonia Mother     Social History   Socioeconomic History   Marital status: Divorced    Spouse name: Not on file   Number of children: Not on file   Years of education: Not on file   Highest education level: Not on file  Occupational History   Not on file  Tobacco Use   Smoking status: Former    Packs/day: 4.00    Years: 43.00    Additional pack years: 0.00    Total pack years: 172.00    Types: Cigarettes    Quit date: 36    Years since quitting: 31.4   Smokeless tobacco: Current    Types: Snuff   Tobacco comments:    changed to dip  Vaping Use   Vaping Use: Never used  Substance and Sexual Activity   Alcohol use: Yes    Alcohol/week: 26.0 standard drinks of alcohol    Types: 26 Cans of beer per week   Drug use: No   Sexual activity: Yes    Birth control/protection: None  Other Topics Concern   Not on file  Social History Narrative   Not on file   Social Determinants of Health   Financial Resource Strain: Not on file  Food Insecurity: Food Insecurity Present (05/15/2023)   Hunger Vital Sign    Worried About Running Out of Food in the Last Year: Sometimes true    Ran Out of Food in the Last Year: Sometimes true  Transportation Needs: No Transportation Needs (05/15/2023)   PRAPARE - Administrator, Civil Service (Medical): No    Lack of Transportation (Non-Medical): No  Physical Activity: Not on file  Stress: Not on file  Social Connections: Not on file  Intimate Partner Violence: Not At Risk (05/15/2023)   Humiliation, Afraid, Rape, and Kick questionnaire    Fear of Current or Ex-Partner: No    Emotionally Abused: No    Physically Abused: No    Sexually Abused: No    Review of Systems: See HPI, otherwise negative ROS  Physical Exam: BP 121/64 (BP Location: Left Arm)   Pulse 85   Temp 98.3 F (36.8 C) (Oral)   Resp 16   Ht 6' (1.829 m)   Wt 94.3 kg   SpO2 100%    BMI 28.21 kg/m  General:   Alert,  pleasant and cooperative in NAD Head:  Normocephalic and atraumatic. Neck:  Supple; no masses or thyromegaly. Lungs:  Clear throughout to auscultation, normal respiratory effort.    Heart:  +S1, +S2, Regular rate and rhythm, No edema. Abdomen:  Soft, nontender and nondistended. Normal bowel sounds, without guarding, and without rebound.   Neurologic:  Alert and  oriented x4;  grossly normal neurologically.  Impression/Plan: [name] is here for an push enteroscopy to be performed for  evaluation of GI bleed    Risks, benefits, limitations,  and alternatives regarding endoscopy have been reviewed with the patient.  Questions have been answered.  All parties agreeable.   Wyline Mood, MD  05/16/2023, 10:47 AM

## 2023-05-17 ENCOUNTER — Encounter: Payer: Self-pay | Admitting: Gastroenterology

## 2023-05-17 ENCOUNTER — Encounter
Admission: EM | Disposition: A | Discharge status: Home / Self Care | Payer: Self-pay | Source: Home / Self Care | Attending: Hospitalist

## 2023-05-17 DIAGNOSIS — D62 Acute posthemorrhagic anemia: Secondary | ICD-10-CM | POA: Diagnosis not present

## 2023-05-17 LAB — BASIC METABOLIC PANEL
Anion gap: 9 (ref 5–15)
BUN: 8 mg/dL (ref 8–23)
CO2: 26 mmol/L (ref 22–32)
Calcium: 8.5 mg/dL — ABNORMAL LOW (ref 8.9–10.3)
Chloride: 102 mmol/L (ref 98–111)
Creatinine, Ser: 0.83 mg/dL (ref 0.61–1.24)
GFR, Estimated: >60 mL/min (ref >60)
Glucose, Bld: 92 mg/dL (ref 70–99)
Potassium: 4.4 mmol/L (ref 3.5–5.1)
Sodium: 137 mmol/L (ref 135–145)

## 2023-05-17 LAB — CBC
HCT: 28.4 % — ABNORMAL LOW (ref 39.0–52.0)
Hemoglobin: 8.4 g/dL — ABNORMAL LOW (ref 13.0–17.0)
MCH: 24.5 pg — ABNORMAL LOW (ref 26.0–34.0)
MCHC: 29.6 g/dL — ABNORMAL LOW (ref 30.0–36.0)
MCV: 82.8 fL (ref 80.0–100.0)
Platelets: 348 10*3/uL (ref 150–400)
RBC: 3.43 MIL/uL — ABNORMAL LOW (ref 4.22–5.81)
RDW: 17.2 % — ABNORMAL HIGH (ref 11.5–15.5)
WBC: 12.3 10*3/uL — ABNORMAL HIGH (ref 4.0–10.5)
nRBC: 0 % (ref 0.0–0.2)

## 2023-05-17 LAB — MAGNESIUM: Magnesium: 2 mg/dL (ref 1.7–2.4)

## 2023-05-17 SURGERY — IMAGING PROCEDURE, GI TRACT, INTRALUMINAL, VIA CAPSULE
Anesthesia: General

## 2023-05-17 NOTE — Progress Notes (Signed)
PROGRESS NOTE    [name]  ZDG:[id number] DOB: [birth year] DOA: 05/13/2023 PCP: Jodi Marble, NP    Brief Narrative:  [name] is a 62 y.o. male with medical history significant for COPD, HTN, CAD s/p CABG 2013,  prior CVA s/p left carotid endarterectomy, cognitive impairment, alcohol use disorder, hospitalized in November 2023 for symptomatic anemia of 6.3 with negative EGD, who presented to the ED with chest pain and weakness and several weeks of dark tarry stools.     Patient's last colonoscopy was in 01/2022 when he had resection of 3 benign polyps.  EGD during his hospitalization in November 2023 was unrevealing.  Review of discharge summary reveals that plans were for possible capsule endoscopy and/or repeat colonoscopy, but it appears that this has not been done.  Patient underwent capsule study on 5/28.  Read by GI.  Active bleeding in small bowel.  Repeat endoscopy performed 5/29.  Area of bleeding identified and cauterized.  On 5/30 patient endorses he has had 2 bowel movements since endoscopy and they are "clearing up" no evidence of bright red blood per rectum.  Hemoglobin 8.4, uptrending from prior   Assessment & Plan:   Principal Problem:   ABLA (acute blood loss anemia) Active Problems:   Symptomatic anemia   Gastrointestinal hemorrhage with melena   Coronary artery disease involving coronary bypass graft of native heart without angina pectoris   Chest pain   Elevated troponin   Chronic diastolic CHF (congestive heart failure) (HCC)   Hyponatremia   Hypertension   Cognitive impairment   History of stroke s/p left carotid endarterectomy   Alcohol use disorder  Melena Symptomatic anemia, suspect acute on chronic GI blood loss Hemoglobin 6.2 down from 12.5 a couple months prior. --s/p 2 units PRBCs --repeat EGD wnl Capsule study positive, small bowel bleeding Endoscopy successful on 5/29 Plan: GI considering repeat capsule study today.  Defer to their    Coronary artery disease involving coronary bypass graft of native heart without angina pectoris Chest pain with elevated troponin History of CABG Chest pain, troponin of 19, 25 on presentation but EKG nonischemic.  Trop increased to 247 the next day, can still be demand, but consulted cardio given pt's hx of CAD. -- Cardiology signed off.  Recommendations appreciated.  No further diagnostics   Chronic diastolic CHF (congestive heart failure) (HCC)  EF 60-65%, Gr II DD 10/17/2022 --cont home Toprol   Hyponatremia, chronic   Alcohol use disorder --pt said he doesn't drink a lot.  No signs of withdrawal.     History of stroke s/p left carotid endarterectomy --cont home ASA and statin.  Monitor for bleeding   Cognitive impairment --cont home donepezil   Hypertension --cont home Toprol   DVT prophylaxis: SCD Code Status: Full Family Communication: None Disposition Plan: Status is: Inpatient Remains inpatient appropriate because: GI bleed   Level of care: Med-Surg  Consultants:  GI  Procedures:  EGD x 2  Antimicrobials: None   Subjective: Seen and examined.  Resting in bed.  No distress.  Objective: Vitals:   05/16/23 1549 05/16/23 1920 05/17/23 0452 05/17/23 0800  BP: 104/68 104/70 (!) 139/114 119/65  Pulse: 85 84 84 83  Resp: 16 16 18 18   Temp: 98.3 F (36.8 C) 98.1 F (36.7 C) 98.2 F (36.8 C) 98 F (36.7 C)  TempSrc: Oral  Oral Oral  SpO2: 100% 100% 97% 98%  Weight:      Height:        Intake/Output Summary (  Last 24 hours) at 05/17/2023 1250 Last data filed at 05/17/2023 1108 Gross per 24 hour  Intake 1940 ml  Output 2300 ml  Net -360 ml   Filed Weights   05/13/23 0002  Weight: 94.3 kg    Examination:  General exam: No acute distress Respiratory system: Lungs clear normal work of breathing.  Room air Cardiovascular system: S1-S2, RRR, no murmurs, no pedal edema Gastrointestinal system: Soft, NT/ND, normal bowel sounds Central nervous  system: Alert and oriented. No focal neurological deficits. Extremities: Symmetric 5 x 5 power. Skin: No rashes, lesions or ulcers Psychiatry: Judgement and insight appear normal. Mood & affect appropriate.     Data Reviewed: I have personally reviewed following labs and imaging studies  CBC: Recent Labs  Lab 05/13/23 0848 05/13/23 2124 05/14/23 1023 05/15/23 0432 05/16/23 0416 05/17/23 0419  WBC 10.2  --  14.5* 13.0* 11.0* 12.3*  HGB 7.7* 8.3* 8.3* 9.1* 7.8* 8.4*  HCT 25.3* 25.8* 26.6* 30.0* 25.3* 28.4*  MCV 81.6  --  80.1 82.2 81.6 82.8  PLT 379  --  343 395 333 348   Basic Metabolic Panel: Recent Labs  Lab 05/13/23 0005 05/14/23 1023 05/15/23 0432 05/16/23 0416 05/17/23 0419  NA 129* 131* 133* 137 137  K 3.6 3.7 3.7 4.0 4.4  CL 97* 98 96* 102 102  CO2 21* 26 27 28 26   GLUCOSE 140* 120* 116* 139* 92  BUN 8 10 14 15 8   CREATININE 0.91 0.91 1.11 1.00 0.83  CALCIUM 8.3* 8.2* 8.6* 8.5* 8.5*  MG  --  1.9 2.2 2.2 2.0   GFR: Estimated Creatinine Clearance: 107.2 mL/min (by C-G formula based on SCr of 0.83 mg/dL). Liver Function Tests: No results for input(s): "AST", "ALT", "ALKPHOS", "BILITOT", "PROT", "ALBUMIN" in the last 168 hours. No results for input(s): "LIPASE", "AMYLASE" in the last 168 hours. No results for input(s): "AMMONIA" in the last 168 hours. Coagulation Profile: No results for input(s): "INR", "PROTIME" in the last 168 hours. Cardiac Enzymes: No results for input(s): "CKTOTAL", "CKMB", "CKMBINDEX", "TROPONINI" in the last 168 hours. BNP (last 3 results) No results for input(s): "PROBNP" in the last 8760 hours. HbA1C: No results for input(s): "HGBA1C" in the last 72 hours. CBG: No results for input(s): "GLUCAP" in the last 168 hours. Lipid Profile: No results for input(s): "CHOL", "HDL", "LDLCALC", "TRIG", "CHOLHDL", "LDLDIRECT" in the last 72 hours. Thyroid Function Tests: No results for input(s): "TSH", "T4TOTAL", "FREET4", "T3FREE",  "THYROIDAB" in the last 72 hours. Anemia Panel: No results for input(s): "VITAMINB12", "FOLATE", "FERRITIN", "TIBC", "IRON", "RETICCTPCT" in the last 72 hours. Sepsis Labs: No results for input(s): "PROCALCITON", "LATICACIDVEN" in the last 168 hours.  No results found for this or any previous visit (from the past 240 hour(s)).       Radiology Studies: ECHOCARDIOGRAM COMPLETE  Result Date: 05/16/2023    ECHOCARDIOGRAM REPORT   Patient Name:   [name] Date of Exam: 05/16/2023 Medical Rec #:  [id number]    Height:       72.0 in Accession #:    [id number]   Weight:       208.0 lb Date of Birth:  10-22-1961     BSA:          2.166 m Patient Age:    62 years     BP:           121/64 mmHg Patient Gender: M            HR:  85 bpm. Exam Location:  [facility] Procedure: 2D Echo, Cardiac Doppler and Color Doppler Indications:     Aortic stenosis I35.0  History:         Patient has prior history of Echocardiogram examinations, most                  recent 10/17/2022. Previous Myocardial Infarction, COPD; Risk                  Factors:Hypertension.  Sonographer:     Cristela Blue Referring Phys:  [id number] [name] MICHELLE TANG Diagnosing Phys: Marcina Millard MD IMPRESSIONS  1. Left ventricular ejection fraction, by estimation, is 60 to 65%. The left ventricle has normal function. The left ventricle has no regional wall motion abnormalities. There is mild left ventricular hypertrophy. Left ventricular diastolic parameters were normal.  2. Right ventricular systolic function is normal. The right ventricular size is normal.  3. The mitral valve is normal in structure. Mild mitral valve regurgitation. No evidence of mitral stenosis.  4. The aortic valve is normal in structure. Aortic valve regurgitation is not visualized. Moderate to severe aortic valve stenosis.  5. The inferior vena cava is normal in size with greater than 50% respiratory variability, suggesting right atrial pressure of 3 mmHg. FINDINGS  Left  Ventricle: Left ventricular ejection fraction, by estimation, is 60 to 65%. The left ventricle has normal function. The left ventricle has no regional wall motion abnormalities. The left ventricular internal cavity size was normal in size. There is  mild left ventricular hypertrophy. Left ventricular diastolic parameters were normal. Right Ventricle: The right ventricular size is normal. No increase in right ventricular wall thickness. Right ventricular systolic function is normal. Left Atrium: Left atrial size was normal in size. Right Atrium: Right atrial size was normal in size. Pericardium: There is no evidence of pericardial effusion. Mitral Valve: The mitral valve is normal in structure. Mild mitral valve regurgitation. No evidence of mitral valve stenosis. MV peak gradient, 8.5 mmHg. The mean mitral valve gradient is 3.0 mmHg. Tricuspid Valve: The tricuspid valve is normal in structure. Tricuspid valve regurgitation is mild . No evidence of tricuspid stenosis. Aortic Valve: The aortic valve is normal in structure. Aortic valve regurgitation is not visualized. Moderate to severe aortic stenosis is present. Aortic valve mean gradient measures 22.0 mmHg. Aortic valve peak gradient measures 37.5 mmHg. Aortic valve  area, by VTI measures 0.89 cm. Pulmonic Valve: The pulmonic valve was normal in structure. Pulmonic valve regurgitation is not visualized. No evidence of pulmonic stenosis. Aorta: The aortic root is normal in size and structure. Venous: The inferior vena cava is normal in size with greater than 50% respiratory variability, suggesting right atrial pressure of 3 mmHg. IAS/Shunts: No atrial level shunt detected by color flow Doppler.  LEFT VENTRICLE PLAX 2D LVIDd:         4.10 cm   Diastology LVIDs:         2.60 cm   LV e' medial:    6.20 cm/s LV PW:         1.00 cm   LV E/e' medial:  20.6 LV IVS:        1.30 cm   LV e' lateral:   8.59 cm/s LVOT diam:     2.00 cm   LV E/e' lateral: 14.9 LV SV:         58  LV SV Index:   27 LVOT Area:     3.14 cm  RIGHT VENTRICLE RV  Basal diam:  4.00 cm RV Mid diam:    3.20 cm RV S prime:     9.03 cm/s TAPSE (M-mode): 1.6 cm LEFT ATRIUM             Index        RIGHT ATRIUM           Index LA diam:        3.60 cm 1.66 cm/m   RA Area:     12.70 cm LA Vol (A2C):   45.7 ml 21.10 ml/m  RA Volume:   27.30 ml  12.60 ml/m LA Vol (A4C):   47.7 ml 22.02 ml/m LA Biplane Vol: 47.0 ml 21.70 ml/m  AORTIC VALVE AV Area (Vmax):    0.80 cm AV Area (Vmean):   0.86 cm AV Area (VTI):     0.89 cm AV Vmax:           306.33 cm/s AV Vmean:          216.000 cm/s AV VTI:            0.650 m AV Peak Grad:      37.5 mmHg AV Mean Grad:      22.0 mmHg LVOT Vmax:         78.20 cm/s LVOT Vmean:        58.800 cm/s LVOT VTI:          0.185 m LVOT/AV VTI ratio: 0.28  AORTA Ao Root diam: 2.80 cm MITRAL VALVE                TRICUSPID VALVE MV Area (PHT): 2.72 cm     TR Peak grad:   19.4 mmHg MV Area VTI:   1.80 cm     TR Vmax:        220.00 cm/s MV Peak grad:  8.5 mmHg MV Mean grad:  3.0 mmHg     SHUNTS MV Vmax:       1.46 m/s     Systemic VTI:  0.18 m MV Vmean:      75.7 cm/s    Systemic Diam: 2.00 cm MV Decel Time: 279 msec MV E velocity: 128.00 cm/s MV A velocity: 119.00 cm/s MV E/A ratio:  1.08 Marcina Millard MD Electronically signed by Marcina Millard MD Signature Date/Time: 05/16/2023/1:07:04 PM    Final         Scheduled Meds:  aspirin EC  81 mg Oral Daily   donepezil  10 mg Oral Daily   enoxaparin (LOVENOX) injection  40 mg Subcutaneous Q24H   escitalopram  5 mg Oral Daily   fluticasone furoate-vilanterol  1 puff Inhalation Daily   folic acid  1 mg Oral Daily   metoprolol succinate  25 mg Oral Daily   mirtazapine  15 mg Oral QHS   multivitamin with minerals  1 tablet Oral Daily   thiamine  100 mg Oral Daily   Or   thiamine  100 mg Intravenous Daily   Continuous Infusions:   LOS: 4 days     Tresa Moore, MD [facility]   If 7PM-7AM, please contact  night-coverage  05/17/2023, 12:50 PM

## 2023-05-18 DIAGNOSIS — D62 Acute posthemorrhagic anemia: Secondary | ICD-10-CM | POA: Diagnosis not present

## 2023-05-18 LAB — CBC
HCT: 26.4 % — ABNORMAL LOW (ref 39.0–52.0)
Hemoglobin: 7.9 g/dL — ABNORMAL LOW (ref 13.0–17.0)
MCH: 24.7 pg — ABNORMAL LOW (ref 26.0–34.0)
MCHC: 29.9 g/dL — ABNORMAL LOW (ref 30.0–36.0)
MCV: 82.5 fL (ref 80.0–100.0)
Platelets: 334 10*3/uL (ref 150–400)
RBC: 3.2 MIL/uL — ABNORMAL LOW (ref 4.22–5.81)
RDW: 16.9 % — ABNORMAL HIGH (ref 11.5–15.5)
WBC: 9.6 10*3/uL (ref 4.0–10.5)
nRBC: 0 % (ref 0.0–0.2)

## 2023-05-18 LAB — BASIC METABOLIC PANEL
Anion gap: 8 (ref 5–15)
BUN: 9 mg/dL (ref 8–23)
CO2: 25 mmol/L (ref 22–32)
Calcium: 8.6 mg/dL — ABNORMAL LOW (ref 8.9–10.3)
Chloride: 102 mmol/L (ref 98–111)
Creatinine, Ser: 0.88 mg/dL (ref 0.61–1.24)
GFR, Estimated: >60 mL/min (ref >60)
Glucose, Bld: 149 mg/dL — ABNORMAL HIGH (ref 70–99)
Potassium: 4 mmol/L (ref 3.5–5.1)
Sodium: 135 mmol/L (ref 135–145)

## 2023-05-18 LAB — MAGNESIUM: Magnesium: 2 mg/dL (ref 1.7–2.4)

## 2023-05-18 MED ORDER — OMEPRAZOLE 20 MG PO CPDR: 20.0000 mg | DELAYED_RELEASE_CAPSULE | Freq: Two times a day (BID) | ORAL | 0 refills | Status: DC

## 2023-05-18 MED ORDER — FLUTICASONE-UMECLIDIN-VILANT 100-62.5-25 MCG/ACT IN AEPB: 1.0000 | INHALATION_SPRAY | Freq: Every day | RESPIRATORY_TRACT | 0 refills | Status: AC

## 2023-05-18 NOTE — Care Management Important Message (Signed)
Important Message  Patient Details  Name: [name] MRN: [id number] Date of Birth: September 20, 1961   Medicare Important Message Given:  Yes     [name] 05/18/2023, 11:00 AM

## 2023-05-18 NOTE — Progress Notes (Signed)
Discharge education completed. IV removed. Patient awaiting ride from family.  [name] [name]

## 2023-05-18 NOTE — TOC Progression Note (Signed)
Transition of Care [facility] - Progression Note    Patient Details  Name: [name] MRN: [id number] Date of Birth: July 31, 1961  Transition of [facility]) CM/SW Contact  Chapman Fitch, RN Phone Number: 05/18/2023, 10:28 AM  Clinical Narrative:     Met with patient at bedside Patient states that is currently living in his car Patient states that he previously had a place to stay, but he missed paying his rent the last time he was in the hospital and was evicted. Patient states that he typically staying in the [facility] parking lot, or the [employer] parking lot.  Patient denies issues obtained medications and states he is independent at baseline  Patient states that he receives $1900  a month in disability.  Patient states that he has previously stayed at [facility] however there was a 30 day limit "because he made to much money".  Patient states that he has used is 30 days.    Patient is working with his case worker Herbert Spires at his PCP, and his Laurena Bering case worker to establish housing.  Discussed the option  of staying at a shelter in a different county until placement was resolved.  Patient states that he prefers to stay in his car in [place].  TOC supervisor and MD updated.   Patient states that at discharge his son will bring him his care  VM left for Berneice Gandy at [place] [phone] to update on patients disposition.  Awaiting return call          Expected Discharge Plan and Services         Expected Discharge Date: 05/18/23                                     Social Determinants of Health (SDOH) Interventions SDOH Screenings   Food Insecurity: Food Insecurity Present (05/15/2023)  Housing: High Risk (05/15/2023)  Transportation Needs: No Transportation Needs (05/15/2023)  Utilities: Not At Risk (05/15/2023)  Tobacco Use: High Risk (05/17/2023)    Readmission Risk Interventions    10/27/2022   11:28 AM  Readmission Risk Prevention Plan   Transportation Screening Complete  PCP or Specialist Appt within 5-7 Days Complete  Home Care Screening Complete  Medication Review (RN CM) Complete

## 2023-05-18 NOTE — Discharge Summary (Signed)
Physician Discharge Summary  [name] ZOX:[id number] DOB: Jan 26, 1961 DOA: 05/13/2023  PCP: Jodi Marble, NP  Admit date: 05/13/2023 Discharge date: 05/18/2023  Admitted From: Home Disposition:  Home  Recommendations for Outpatient Follow-up:  Follow up with PCP in 1-2 weeks   Home Health:No Equipment/Devices:None   Discharge Condition:Stabl  CODE STATUS:FULL  Diet recommendation: Soft/bland  Brief/Interim Summary: [name] is a 62 y.o. male with medical history significant for COPD, HTN, CAD s/p CABG 2013,  prior CVA s/p left carotid endarterectomy, cognitive impairment, alcohol use disorder, hospitalized in November 2023 for symptomatic anemia of 6.3 with negative EGD, who presented to the ED with chest pain and weakness and several weeks of dark tarry stools.     Patient's last colonoscopy was in 01/2022 when he had resection of 3 benign polyps.  EGD during his hospitalization in November 2023 was unrevealing.  Review of discharge summary reveals that plans were for possible capsule endoscopy and/or repeat colonoscopy, but it appears that this has not been done.   Patient underwent capsule study on 5/28.  Read by GI.  Active bleeding in small bowel.  Repeat endoscopy performed 5/29.  Area of bleeding identified and cauterized.   On 5/30 patient endorses he has had 2 bowel movements since endoscopy and they are "clearing up" no evidence of bright red blood per rectum.  Hemoglobin 8.4, uptrending from prior  5/31.  Hemoglobin stable.  No further episodes of bleeding.  Stable for discharge.    Discharge Diagnoses:  Principal Problem:   ABLA (acute blood loss anemia) Active Problems:   Symptomatic anemia   Gastrointestinal hemorrhage with melena   Coronary artery disease involving coronary bypass graft of native heart without angina pectoris   Chest pain   Elevated troponin   Chronic diastolic CHF (congestive heart failure) (HCC)   Hyponatremia   Hypertension    Cognitive impairment   History of stroke s/p left carotid endarterectomy   Alcohol use disorder  Melena Symptomatic anemia, suspect acute on chronic GI blood loss Hemoglobin 6.2 down from 12.5 a couple months prior. --s/p 2 units PRBCs --repeat EGD wnl Capsule study positive, small bowel bleeding Endoscopy successful on 5/29 Plan: Discharged home.  Twice daily PPI   Coronary artery disease involving coronary bypass graft of native heart without angina pectoris Chest pain with elevated troponin History of CABG Chest pain, troponin of 19, 25 on presentation but EKG nonischemic.  Trop increased to 247 the next day, can still be demand, but consulted cardio given pt's hx of CAD. -- Cardiology signed off.  Recommendations appreciated.  No further diagnostics   Chronic diastolic CHF (congestive heart failure) (HCC)  EF 60-65%, Gr II DD 10/17/2022 --cont home Toprol   Hyponatremia, chronic   Alcohol use disorder --pt said he doesn't drink a lot.  No signs of withdrawal.     History of stroke s/p left carotid endarterectomy --cont home ASA and statin.  Monitor for bleeding   Cognitive impairment --cont home donepezil   Hypertension --cont home Toprol  Discharge Instructions  Discharge Instructions     Diet - low sodium heart healthy   Complete by: As directed    Increase activity slowly   Complete by: As directed       Allergies as of 05/18/2023       Reactions   Other    Welbutrin - has paranoia         Medication List     TAKE these medications    albuterol  108 (90 Base) MCG/ACT inhaler Commonly known as: VENTOLIN HFA Inhale 2 puffs into the lungs every 4 (four) hours as needed for wheezing or shortness of breath.   albuterol 108 (90 Base) MCG/ACT inhaler Commonly known as: VENTOLIN HFA Inhale 2 puffs into the lungs every 6 (six) hours as needed for wheezing or shortness of breath.   amLODipine 5 MG tablet Commonly known as: NORVASC Take 5 mg by mouth  daily.   Aspirin Low Dose 81 MG tablet Generic drug: aspirin EC Take 81 mg by mouth daily.   cetirizine 10 MG tablet Commonly known as: ZYRTEC Take 10 mg by mouth daily.   cyanocobalamin 1000 MCG tablet Commonly known as: VITAMIN B12 Take 1 tablet (1,000 mcg total) by mouth daily.   donepezil 10 MG tablet Commonly known as: ARICEPT Take 10 mg by mouth daily.   escitalopram 5 MG tablet Commonly known as: LEXAPRO Take 5 mg by mouth daily.   fluticasone 50 MCG/ACT nasal spray Commonly known as: FLONASE Place 2 sprays into both nostrils daily.   Fluticasone-Umeclidin-Vilant 100-62.5-25 MCG/ACT Aepb Commonly known as: Trelegy Ellipta Inhale 1 puff into the lungs daily. What changed: medication strength   Melatonin 10 MG Caps Take 1 capsule by mouth at bedtime.   metoprolol succinate 25 MG 24 hr tablet Commonly known as: TOPROL-XL Take 25 mg by mouth daily.   mirtazapine 15 MG tablet Commonly known as: REMERON Take 15 mg by mouth at bedtime.   naltrexone 50 MG tablet Commonly known as: DEPADE Take 50 mg by mouth daily.   omeprazole 20 MG capsule Commonly known as: PRILOSEC Take 1 capsule (20 mg total) by mouth 2 (two) times daily before a meal.   pravastatin 20 MG tablet Commonly known as: PRAVACHOL Take 20 mg by mouth at bedtime.        Follow-up Information     Paraschos, Alexander, MD. Go in 1 week(s).   Specialty: Cardiology Why: appt on 07/31 @10 :15am Contact information: [address] [facility] [place] [address] [phone]                Allergies  Allergen Reactions   Other     Welbutrin - has paranoia     Consultations: GI   Procedures/Studies: ECHOCARDIOGRAM COMPLETE  Result Date: 05/16/2023    ECHOCARDIOGRAM REPORT   Patient Name:   [name] Date of Exam: 05/16/2023 Medical Rec #:  [id number]    Height:       72.0 in Accession #:    [id number]   Weight:       208.0 lb Date of Birth:   June 30, 1961     BSA:          2.166 m Patient Age:    62 years     BP:           121/64 mmHg Patient Gender: M            HR:           85 bpm. Exam Location:  [facility] Procedure: 2D Echo, Cardiac Doppler and Color Doppler Indications:     Aortic stenosis I35.0  History:         Patient has prior history of Echocardiogram examinations, most                  recent 10/17/2022. Previous Myocardial Infarction, COPD; Risk                  Factors:Hypertension.  Sonographer:     Cristela Blue Referring Phys:  [id number] [facility] MICHELLE TANG Diagnosing Phys: Marcina Millard MD IMPRESSIONS  1. Left ventricular ejection fraction, by estimation, is 60 to 65%. The left ventricle has normal function. The left ventricle has no regional wall motion abnormalities. There is mild left ventricular hypertrophy. Left ventricular diastolic parameters were normal.  2. Right ventricular systolic function is normal. The right ventricular size is normal.  3. The mitral valve is normal in structure. Mild mitral valve regurgitation. No evidence of mitral stenosis.  4. The aortic valve is normal in structure. Aortic valve regurgitation is not visualized. Moderate to severe aortic valve stenosis.  5. The inferior vena cava is normal in size with greater than 50% respiratory variability, suggesting right atrial pressure of 3 mmHg. FINDINGS  Left Ventricle: Left ventricular ejection fraction, by estimation, is 60 to 65%. The left ventricle has normal function. The left ventricle has no regional wall motion abnormalities. The left ventricular internal cavity size was normal in size. There is  mild left ventricular hypertrophy. Left ventricular diastolic parameters were normal. Right Ventricle: The right ventricular size is normal. No increase in right ventricular wall thickness. Right ventricular systolic function is normal. Left Atrium: Left atrial size was normal in size. Right Atrium: Right atrial size was normal in size. Pericardium: There is no  evidence of pericardial effusion. Mitral Valve: The mitral valve is normal in structure. Mild mitral valve regurgitation. No evidence of mitral valve stenosis. MV peak gradient, 8.5 mmHg. The mean mitral valve gradient is 3.0 mmHg. Tricuspid Valve: The tricuspid valve is normal in structure. Tricuspid valve regurgitation is mild . No evidence of tricuspid stenosis. Aortic Valve: The aortic valve is normal in structure. Aortic valve regurgitation is not visualized. Moderate to severe aortic stenosis is present. Aortic valve mean gradient measures 22.0 mmHg. Aortic valve peak gradient measures 37.5 mmHg. Aortic valve  area, by VTI measures 0.89 cm. Pulmonic Valve: The pulmonic valve was normal in structure. Pulmonic valve regurgitation is not visualized. No evidence of pulmonic stenosis. Aorta: The aortic root is normal in size and structure. Venous: The inferior vena cava is normal in size with greater than 50% respiratory variability, suggesting right atrial pressure of 3 mmHg. IAS/Shunts: No atrial level shunt detected by color flow Doppler.  LEFT VENTRICLE PLAX 2D LVIDd:         4.10 cm   Diastology LVIDs:         2.60 cm   LV e' medial:    6.20 cm/s LV PW:         1.00 cm   LV E/e' medial:  20.6 LV IVS:        1.30 cm   LV e' lateral:   8.59 cm/s LVOT diam:     2.00 cm   LV E/e' lateral: 14.9 LV SV:         58 LV SV Index:   27 LVOT Area:     3.14 cm  RIGHT VENTRICLE RV Basal diam:  4.00 cm RV Mid diam:    3.20 cm RV S prime:     9.03 cm/s TAPSE (M-mode): 1.6 cm LEFT ATRIUM             Index        RIGHT ATRIUM           Index LA diam:        3.60 cm 1.66 cm/m   RA Area:     12.70 cm  LA Vol (A2C):   45.7 ml 21.10 ml/m  RA Volume:   27.30 ml  12.60 ml/m LA Vol (A4C):   47.7 ml 22.02 ml/m LA Biplane Vol: 47.0 ml 21.70 ml/m  AORTIC VALVE AV Area (Vmax):    0.80 cm AV Area (Vmean):   0.86 cm AV Area (VTI):     0.89 cm AV Vmax:           306.33 cm/s AV Vmean:          216.000 cm/s AV VTI:            0.650 m  AV Peak Grad:      37.5 mmHg AV Mean Grad:      22.0 mmHg LVOT Vmax:         78.20 cm/s LVOT Vmean:        58.800 cm/s LVOT VTI:          0.185 m LVOT/AV VTI ratio: 0.28  AORTA Ao Root diam: 2.80 cm MITRAL VALVE                TRICUSPID VALVE MV Area (PHT): 2.72 cm     TR Peak grad:   19.4 mmHg MV Area VTI:   1.80 cm     TR Vmax:        220.00 cm/s MV Peak grad:  8.5 mmHg MV Mean grad:  3.0 mmHg     SHUNTS MV Vmax:       1.46 m/s     Systemic VTI:  0.18 m MV Vmean:      75.7 cm/s    Systemic Diam: 2.00 cm MV Decel Time: 279 msec MV E velocity: 128.00 cm/s MV A velocity: 119.00 cm/s MV E/A ratio:  1.08 Marcina Millard MD Electronically signed by Marcina Millard MD Signature Date/Time: 05/16/2023/1:07:04 PM    Final    DG Chest Port 1 View  Result Date: 05/13/2023 CLINICAL DATA:  [id number] Dyspnea [id number] EXAM: PORTABLE CHEST 1 VIEW COMPARISON:  Chest x-ray 05/13/2023 FINDINGS: The heart and mediastinal contours are unchanged. Aortic calcification. No focal consolidation. No pulmonary edema. No pleural effusion. No pneumothorax. No acute osseous abnormality.  Sternotomy wires are intact. IMPRESSION: 1. No active disease. 2.  Aortic Atherosclerosis (ICD10-I70.0). Electronically Signed   By: Tish Frederickson M.D.   On: 05/13/2023 22:15   DG Chest 2 View  Result Date: 05/13/2023 CLINICAL DATA:  Chest pain EXAM: CHEST - 2 VIEW COMPARISON:  03/13/2023 FINDINGS: Prior CABG. There is hyperinflation of the lungs compatible with COPD. Heart and mediastinal contours are within normal limits. No focal opacities or effusions. No acute bony abnormality. IMPRESSION: COPD.  No active disease. Electronically Signed   By: Charlett Nose M.D.   On: 05/13/2023 00:28      Subjective: Seen and examined at the time of discharge.  Stable no distress.  Appropriate for discharge  Discharge Exam: Vitals:   05/18/23 0405 05/18/23 0753  BP: 108/83 130/68  Pulse: 77 69  Resp: 18 16  Temp: 98 F (36.7 C) 98.3 F (36.8 C)   SpO2: 100% 98%   Vitals:   05/17/23 1650 05/17/23 1948 05/18/23 0405 05/18/23 0753  BP: 124/74 119/68 108/83 130/68  Pulse: 86 87 77 69  Resp: 18 18 18 16   Temp: 98 F (36.7 C) 98.1 F (36.7 C) 98 F (36.7 C) 98.3 F (36.8 C)  TempSrc:   Oral   SpO2: 100% 100% 100% 98%  Weight:      Height:  General: Pt is alert, awake, not in acute distress Cardiovascular: RRR, S1/S2 +, no rubs, no gallops Respiratory: CTA bilaterally, no wheezing, no rhonchi Abdominal: Soft, NT, ND, bowel sounds + Extremities: no edema, no cyanosis    The results of significant diagnostics from this hospitalization (including imaging, microbiology, ancillary and laboratory) are listed below for reference.     Microbiology: No results found for this or any previous visit (from the past 240 hour(s)).   Labs: BNP (last 3 results) Recent Labs    10/25/22 0228 05/13/23 2208  BNP 702.2* 899.9*   Basic Metabolic Panel: Recent Labs  Lab 05/14/23 1023 05/15/23 0432 05/16/23 0416 05/17/23 0419 05/18/23 0414  NA 131* 133* 137 137 135  K 3.7 3.7 4.0 4.4 4.0  CL 98 96* 102 102 102  CO2 26 27 28 26 25   GLUCOSE 120* 116* 139* 92 149*  BUN 10 14 15 8 9   CREATININE 0.91 1.11 1.00 0.83 0.88  CALCIUM 8.2* 8.6* 8.5* 8.5* 8.6*  MG 1.9 2.2 2.2 2.0 2.0   Liver Function Tests: No results for input(s): "AST", "ALT", "ALKPHOS", "BILITOT", "PROT", "ALBUMIN" in the last 168 hours. No results for input(s): "LIPASE", "AMYLASE" in the last 168 hours. No results for input(s): "AMMONIA" in the last 168 hours. CBC: Recent Labs  Lab 05/14/23 1023 05/15/23 0432 05/16/23 0416 05/17/23 0419 05/18/23 0414  WBC 14.5* 13.0* 11.0* 12.3* 9.6  HGB 8.3* 9.1* 7.8* 8.4* 7.9*  HCT 26.6* 30.0* 25.3* 28.4* 26.4*  MCV 80.1 82.2 81.6 82.8 82.5  PLT 343 395 333 348 334   Cardiac Enzymes: No results for input(s): "CKTOTAL", "CKMB", "CKMBINDEX", "TROPONINI" in the last 168 hours. BNP: Invalid input(s):  "POCBNP" CBG: No results for input(s): "GLUCAP" in the last 168 hours. D-Dimer No results for input(s): "DDIMER" in the last 72 hours. Hgb A1c No results for input(s): "HGBA1C" in the last 72 hours. Lipid Profile No results for input(s): "CHOL", "HDL", "LDLCALC", "TRIG", "CHOLHDL", "LDLDIRECT" in the last 72 hours. Thyroid function studies No results for input(s): "TSH", "T4TOTAL", "T3FREE", "THYROIDAB" in the last 72 hours.  Invalid input(s): "FREET3" Anemia work up No results for input(s): "VITAMINB12", "FOLATE", "FERRITIN", "TIBC", "IRON", "RETICCTPCT" in the last 72 hours. Urinalysis    Component Value Date/Time   COLORURINE YELLOW (A) 06/03/2021 2055   APPEARANCEUR CLOUDY (A) 06/03/2021 2055   APPEARANCEUR Clear 09/09/2014 1708   LABSPEC 1.012 06/03/2021 2055   LABSPEC 1.011 09/09/2014 1708   PHURINE 6.0 06/03/2021 2055   GLUCOSEU 50 (A) 06/03/2021 2055   GLUCOSEU Negative 09/09/2014 1708   HGBUR SMALL (A) 06/03/2021 2055   BILIRUBINUR NEGATIVE 06/03/2021 2055   BILIRUBINUR Negative 09/09/2014 1708   KETONESUR 5 (A) 06/03/2021 2055   PROTEINUR 100 (A) 06/03/2021 2055   NITRITE NEGATIVE 06/03/2021 2055   LEUKOCYTESUR NEGATIVE 06/03/2021 2055   LEUKOCYTESUR Negative 09/09/2014 1708   Sepsis Labs Recent Labs  Lab 05/15/23 0432 05/16/23 0416 05/17/23 0419 05/18/23 0414  WBC 13.0* 11.0* 12.3* 9.6   Microbiology No results found for this or any previous visit (from the past 240 hour(s)).   Time coordinating discharge: Over 30 minutes  SIGNED:   Tresa Moore, MD  [facility] 05/18/2023, 11:50 AM Pager   If 7PM-7AM, please contact night-coverage

## 2023-05-18 NOTE — Plan of Care (Signed)
Adequate for Discharge. Resolving plan of care.   Cornell Barman Klayton Monie

## 2023-05-30 NOTE — Progress Notes (Deleted)
Celso [name], PA-C [address]  [address]  [place], [place] [address]  Main: [phone]  Fax: [phone]   Gastroenterology Consultation  Referring Provider:     Jodi Marble, NP Primary Care Physician:  Jodi Marble, NP Primary Gastroenterologist:  Dr. Wyline Mood / Celso [name], PA-C  Reason for Consultation:     Diarrhea, Incontinence, Alcohol Dependence, Anemia, f/u GI Bleed        HPI:   Ildefonso Stiller is a 62 y.o. y/o male referred for consultation & management  by Jodi Marble, NP.    Patient was hospitalized 05/13/2023 until 05/18/2023 at [facility] for GI bleed, melena, chest pain, and acute blood loss anemia.  Also had his hospitalization for same issues 10/2022.  Medical history significant for COPD, HTN, CAD s/p CABG 2013, prior CVA s/p left carotid endarterectomy, cognitive impairment, alcohol use disorder   -EGD done 05/13/2023 showed normal esophagus, stomach, and duodenum. -Capsule study 05/15/2023 showed active bleeding in the small bowel. -Small bowel enteroscopy 05/16/2023 by Dr. Tobi Bastos showed the area of bleeding identified and cauterized.  There were a few angioectasias in the proximal jejunum cauterized with argon plasma coagulation (APC). -Colonoscopy 01/2022 showed 3 small polyps (2 sessile serrated and 1 tubular adenoma) removed. -EGD 10/2022 unrevealing.  Biopsy negative for H. Pylori and Barrett's.  Current Symptoms: Since hospital discharge  Labs 05/18/23 showed hemoglobin 7.9, hematocrit 26, MCV 82, platelets 334.  Low iron 15, iron saturation 4%.  Normal folate and B12.  He received a blood transfusion 05/21/2023.  No repeat labs since then.  Iron supplement?   Past Medical History:  Diagnosis Date   COPD (chronic obstructive pulmonary disease) (HCC)    CVA (cerebral infarction)    Headache    mild, since stroke 2012   Hypertension    MI (myocardial infarction) (HCC) 05/09/2012   Reading difficulty    pt reports he reads at "about a second  grade level"   Stroke [facility]) 2012   numbness to left hand    Wears dentures    full upper and lower    Past Surgical History:  Procedure Laterality Date   CAROTID ENDARTERECTOMY Left    2012 or 2013   CHOLECYSTECTOMY N/A 07/26/2016   Procedure: LAPAROSCOPIC CHOLECYSTECTOMY;  Surgeon: Lattie Haw, MD;  Location: [facility] ORS;  Service: General;  Laterality: N/A;   COLONOSCOPY WITH PROPOFOL N/A 02/09/2022   Procedure: COLONOSCOPY WITH PROPOFOL;  Surgeon: Midge Minium, MD;  Location: [facility] ENDOSCOPY;  Service: Endoscopy;  Laterality: N/A;   CORONARY ARTERY BYPASS GRAFT  05/10/2012   3 vessel   ENTEROSCOPY N/A 05/16/2023   Procedure: ENTEROSCOPY;  Surgeon: Wyline Mood, MD;  Location: [facility] ENDOSCOPY;  Service: Gastroenterology;  Laterality: N/A;   ESOPHAGOGASTRODUODENOSCOPY  05/13/2023   Procedure: ESOPHAGOGASTRODUODENOSCOPY (EGD);  Surgeon: Midge Minium, MD;  Location: [facility] ENDOSCOPY;  Service: Endoscopy;;   ESOPHAGOGASTRODUODENOSCOPY (EGD) WITH PROPOFOL N/A 10/28/2022   Procedure: ESOPHAGOGASTRODUODENOSCOPY (EGD) WITH PROPOFOL;  Surgeon: Jaynie Collins, DO;  Location: [facility] ENDOSCOPY;  Service: Gastroenterology;  Laterality: N/A;   GIVENS CAPSULE STUDY  05/13/2023   Procedure: GIVENS CAPSULE STUDY;  Surgeon: Midge Minium, MD;  Location: [facility] ENDOSCOPY;  Service: Endoscopy;;    Prior to Admission medications   Medication Sig Start Date End Date Taking? Authorizing Provider  albuterol (VENTOLIN HFA) 108 (90 Base) MCG/ACT inhaler Inhale 2 puffs into the lungs every 4 (four) hours as needed for wheezing or shortness of breath. 08/14/22   Raechel Chute, MD  albuterol (  VENTOLIN HFA) 108 (90 Base) MCG/ACT inhaler Inhale 2 puffs into the lungs every 6 (six) hours as needed for wheezing or shortness of breath. 03/13/23   Willy Eddy, MD  amLODipine (NORVASC) 5 MG tablet Take 5 mg by mouth daily. 04/27/23   [provider]  aspirin EC (ASPIRIN LOW DOSE) 81 MG tablet Take 81 mg by mouth  daily. 04/27/23   [provider]  cetirizine (ZYRTEC) 10 MG tablet Take 10 mg by mouth daily. 08/04/22   [provider]  donepezil (ARICEPT) 10 MG tablet Take 10 mg by mouth daily.     [provider]  escitalopram (LEXAPRO) 5 MG tablet Take 5 mg by mouth daily. 04/19/23   [provider]  fluticasone (FLONASE) 50 MCG/ACT nasal spray Place 2 sprays into both nostrils daily. 08/04/22   [provider]  Fluticasone-Umeclidin-Vilant (TRELEGY ELLIPTA) 100-62.5-25 MCG/ACT AEPB Inhale 1 puff into the lungs daily. 05/18/23 06/17/23  Tresa Moore, MD  Melatonin 10 MG CAPS Take 1 capsule by mouth at bedtime. 08/04/22   [provider]  metoprolol succinate (TOPROL-XL) 25 MG 24 hr tablet Take 25 mg by mouth daily. 05/24/21   [provider]  mirtazapine (REMERON) 15 MG tablet Take 15 mg by mouth at bedtime. 05/24/21   [provider]  naltrexone (DEPADE) 50 MG tablet Take 50 mg by mouth daily. 04/16/23   [provider]  omeprazole (PRILOSEC) 20 MG capsule Take 1 capsule (20 mg total) by mouth 2 (two) times daily before a meal. 05/18/23   Sreenath, Sudheer B, MD  pravastatin (PRAVACHOL) 20 MG tablet Take 20 mg by mouth at bedtime.     [provider]  vitamin B-12 (CYANOCOBALAMIN) 1000 MCG tablet Take 1 tablet (1,000 mcg total) by mouth daily. 06/05/21   Rolly Salter, MD    Family History  Problem Relation Age of Onset   Pneumonia Mother      Social History   Tobacco Use   Smoking status: Former    Packs/day: 4.00    Years: 43.00    Additional pack years: 0.00    Total pack years: 172.00    Types: Cigarettes    Quit date: 17    Years since quitting: 31.4   Smokeless tobacco: Current    Types: Snuff   Tobacco comments:    changed to dip  Vaping Use   Vaping Use: Never used  Substance Use Topics   Alcohol use: Yes    Alcohol/week: 26.0 standard drinks of alcohol    Types: 26 Cans of beer per week    Drug use: No    Allergies as of 05/31/2023 - Review Complete 05/16/2023  Allergen Reaction Noted   Other  01/17/2022    Review of Systems:    All systems reviewed and negative except where noted in HPI.   Physical Exam:  There were no vitals taken for this visit. No LMP for male patient. Psych:  Alert and cooperative. Normal mood and affect. General:   Alert,  Well-developed, well-nourished, pleasant and cooperative in NAD Head:  Normocephalic and atraumatic. Eyes:  Sclera clear, no icterus.   Conjunctiva pink. Neck:  Supple; no masses or thyromegaly. Lungs:  Respirations even and unlabored.  Clear throughout to auscultation.   No wheezes, crackles, or rhonchi. No acute distress. Heart:  Regular rate and rhythm; no murmurs, clicks, rubs, or gallops. Abdomen:  Normal bowel sounds.  No bruits.  Soft, and non-distended without masses, hepatosplenomegaly or hernias  noted.  No Tenderness.  No guarding or rebound tenderness.    Neurologic:  Alert and oriented x3;  grossly normal neurologically. Psych:  Alert and cooperative. Normal mood and affect.  Imaging Studies: ECHOCARDIOGRAM COMPLETE  Result Date: 05/16/2023    ECHOCARDIOGRAM REPORT   Patient Name:   Masaji Birchler Date of Exam: 05/16/2023 Medical Rec #:  [id number]    Height:       72.0 in Accession #:    [id number]   Weight:       208.0 lb Date of Birth:  Nov 08, 1961     BSA:          2.166 m Patient Age:    62 years     BP:           121/64 mmHg Patient Gender: M            HR:           85 bpm. Exam Location:  [facility] Procedure: 2D Echo, Cardiac Doppler and Color Doppler Indications:     Aortic stenosis I35.0  History:         Patient has prior history of Echocardiogram examinations, most                  recent 10/17/2022. Previous Myocardial Infarction, COPD; Risk                  Factors:Hypertension.  Sonographer:     Cristela Blue Referring Phys:  [id number] [facility] MICHELLE TANG Diagnosing Phys: Marcina Millard MD IMPRESSIONS  1. Left  ventricular ejection fraction, by estimation, is 60 to 65%. The left ventricle has normal function. The left ventricle has no regional wall motion abnormalities. There is mild left ventricular hypertrophy. Left ventricular diastolic parameters were normal.  2. Right ventricular systolic function is normal. The right ventricular size is normal.  3. The mitral valve is normal in structure. Mild mitral valve regurgitation. No evidence of mitral stenosis.  4. The aortic valve is normal in structure. Aortic valve regurgitation is not visualized. Moderate to severe aortic valve stenosis.  5. The inferior vena cava is normal in size with greater than 50% respiratory variability, suggesting right atrial pressure of 3 mmHg. FINDINGS  Left Ventricle: Left ventricular ejection fraction, by estimation, is 60 to 65%. The left ventricle has normal function. The left ventricle has no regional wall motion abnormalities. The left ventricular internal cavity size was normal in size. There is  mild left ventricular hypertrophy. Left ventricular diastolic parameters were normal. Right Ventricle: The right ventricular size is normal. No increase in right ventricular wall thickness. Right ventricular systolic function is normal. Left Atrium: Left atrial size was normal in size. Right Atrium: Right atrial size was normal in size. Pericardium: There is no evidence of pericardial effusion. Mitral Valve: The mitral valve is normal in structure. Mild mitral valve regurgitation. No evidence of mitral valve stenosis. MV peak gradient, 8.5 mmHg. The mean mitral valve gradient is 3.0 mmHg. Tricuspid Valve: The tricuspid valve is normal in structure. Tricuspid valve regurgitation is mild . No evidence of tricuspid stenosis. Aortic Valve: The aortic valve is normal in structure. Aortic valve regurgitation is not visualized. Moderate to severe aortic stenosis is present. Aortic valve mean gradient measures 22.0 mmHg. Aortic valve peak gradient  measures 37.5 mmHg. Aortic valve  area, by VTI measures 0.89 cm. Pulmonic Valve: The pulmonic valve was normal in structure. Pulmonic valve regurgitation is not visualized. No evidence of pulmonic stenosis. Aorta: The  aortic root is normal in size and structure. Venous: The inferior vena cava is normal in size with greater than 50% respiratory variability, suggesting right atrial pressure of 3 mmHg. IAS/Shunts: No atrial level shunt detected by color flow Doppler.  LEFT VENTRICLE PLAX 2D LVIDd:         4.10 cm   Diastology LVIDs:         2.60 cm   LV e' medial:    6.20 cm/s LV PW:         1.00 cm   LV E/e' medial:  20.6 LV IVS:        1.30 cm   LV e' lateral:   8.59 cm/s LVOT diam:     2.00 cm   LV E/e' lateral: 14.9 LV SV:         58 LV SV Index:   27 LVOT Area:     3.14 cm  RIGHT VENTRICLE RV Basal diam:  4.00 cm RV Mid diam:    3.20 cm RV S prime:     9.03 cm/s TAPSE (M-mode): 1.6 cm LEFT ATRIUM             Index        RIGHT ATRIUM           Index LA diam:        3.60 cm 1.66 cm/m   RA Area:     12.70 cm LA Vol (A2C):   45.7 ml 21.10 ml/m  RA Volume:   27.30 ml  12.60 ml/m LA Vol (A4C):   47.7 ml 22.02 ml/m LA Biplane Vol: 47.0 ml 21.70 ml/m  AORTIC VALVE AV Area (Vmax):    0.80 cm AV Area (Vmean):   0.86 cm AV Area (VTI):     0.89 cm AV Vmax:           306.33 cm/s AV Vmean:          216.000 cm/s AV VTI:            0.650 m AV Peak Grad:      37.5 mmHg AV Mean Grad:      22.0 mmHg LVOT Vmax:         78.20 cm/s LVOT Vmean:        58.800 cm/s LVOT VTI:          0.185 m LVOT/AV VTI ratio: 0.28  AORTA Ao Root diam: 2.80 cm MITRAL VALVE                TRICUSPID VALVE MV Area (PHT): 2.72 cm     TR Peak grad:   19.4 mmHg MV Area VTI:   1.80 cm     TR Vmax:        220.00 cm/s MV Peak grad:  8.5 mmHg MV Mean grad:  3.0 mmHg     SHUNTS MV Vmax:       1.46 m/s     Systemic VTI:  0.18 m MV Vmean:      75.7 cm/s    Systemic Diam: 2.00 cm MV Decel Time: 279 msec MV E velocity: 128.00 cm/s MV A velocity: 119.00  cm/s MV E/A ratio:  1.08 Marcina Millard MD Electronically signed by Marcina Millard MD Signature Date/Time: 05/16/2023/1:07:04 PM    Final    DG Chest Port 1 View  Result Date: 05/13/2023 CLINICAL DATA:  [id number] Dyspnea [id number] EXAM: PORTABLE CHEST 1 VIEW COMPARISON:  Chest x-ray 05/13/2023 FINDINGS: The heart and mediastinal contours are unchanged.  Aortic calcification. No focal consolidation. No pulmonary edema. No pleural effusion. No pneumothorax. No acute osseous abnormality.  Sternotomy wires are intact. IMPRESSION: 1. No active disease. 2.  Aortic Atherosclerosis (ICD10-I70.0). Electronically Signed   By: Tish Frederickson M.D.   On: 05/13/2023 22:15   DG Chest 2 View  Result Date: 05/13/2023 CLINICAL DATA:  Chest pain EXAM: CHEST - 2 VIEW COMPARISON:  03/13/2023 FINDINGS: Prior CABG. There is hyperinflation of the lungs compatible with COPD. Heart and mediastinal contours are within normal limits. No focal opacities or effusions. No acute bony abnormality. IMPRESSION: COPD.  No active disease. Electronically Signed   By: Charlett Nose M.D.   On: 05/13/2023 00:28    Assessment and Plan:   Sargent Gapp is a 62 y.o. y/o male has been referred for hospital follow-up of acute GI bleed and blood loss anemia.  mall bowel enteroscopy 05/16/2023 by Dr. Tobi Bastos showed the area of bleeding identified and cauterized.  There were a few angioectasias in the proximal jejunum cauterized with argon plasma coagulation (APC).  Normal EGD.  Acute GI bleed  From angioectasias in the proximal jejunum, recently cauterized with APC 05/16/2023.  Avoid all NSAIDs.  Stop alcohol.  ED precautions discussed if symptoms worsen.  Continue/start PPI?  Acute blood loss anemia Monitor labs periodically.   Iron deficiency anemia  Labs: CBC, iron panel, ferritin, and celiac lab.   IV iron?  Oral iron?  Alcohol dependence  Encouraged abstinence from alcohol.  Resources discussed such as Alcoholics Anonymous and  Risk manager.   Follow up in ***  Celso [name], PA-C    BP check ***

## 2023-05-31 ENCOUNTER — Inpatient Hospital Stay
Admission: EM | Admit: 2023-05-31 | Discharge: 2023-06-02 | DRG: 378 | Disposition: A | Discharge status: Home / Self Care | Payer: 59 | Attending: Internal Medicine | Admitting: Internal Medicine

## 2023-05-31 ENCOUNTER — Other Ambulatory Visit: Payer: Self-pay

## 2023-05-31 ENCOUNTER — Ambulatory Visit: Payer: 59 | Admitting: Physician Assistant

## 2023-05-31 DIAGNOSIS — D75839 Thrombocytosis, unspecified: Secondary | ICD-10-CM | POA: Diagnosis present

## 2023-05-31 DIAGNOSIS — F0390 Unspecified dementia without behavioral disturbance: Secondary | ICD-10-CM | POA: Diagnosis present

## 2023-05-31 DIAGNOSIS — K625 Hemorrhage of anus and rectum: Secondary | ICD-10-CM | POA: Diagnosis not present

## 2023-05-31 DIAGNOSIS — I739 Peripheral vascular disease, unspecified: Secondary | ICD-10-CM | POA: Diagnosis present

## 2023-05-31 DIAGNOSIS — Z8673 Personal history of transient ischemic attack (TIA), and cerebral infarction without residual deficits: Secondary | ICD-10-CM | POA: Diagnosis not present

## 2023-05-31 DIAGNOSIS — D509 Iron deficiency anemia, unspecified: Secondary | ICD-10-CM | POA: Diagnosis not present

## 2023-05-31 DIAGNOSIS — D649 Anemia, unspecified: Secondary | ICD-10-CM

## 2023-05-31 DIAGNOSIS — I10 Essential (primary) hypertension: Secondary | ICD-10-CM | POA: Diagnosis not present

## 2023-05-31 DIAGNOSIS — Z720 Tobacco use: Secondary | ICD-10-CM | POA: Diagnosis present

## 2023-05-31 DIAGNOSIS — R4189 Other symptoms and signs involving cognitive functions and awareness: Secondary | ICD-10-CM | POA: Diagnosis present

## 2023-05-31 DIAGNOSIS — I110 Hypertensive heart disease with heart failure: Secondary | ICD-10-CM | POA: Diagnosis present

## 2023-05-31 DIAGNOSIS — Z5902 Unsheltered homelessness: Secondary | ICD-10-CM | POA: Diagnosis not present

## 2023-05-31 DIAGNOSIS — I252 Old myocardial infarction: Secondary | ICD-10-CM | POA: Diagnosis not present

## 2023-05-31 DIAGNOSIS — D500 Iron deficiency anemia secondary to blood loss (chronic): Secondary | ICD-10-CM | POA: Diagnosis not present

## 2023-05-31 DIAGNOSIS — D62 Acute posthemorrhagic anemia: Secondary | ICD-10-CM | POA: Diagnosis present

## 2023-05-31 DIAGNOSIS — I5032 Chronic diastolic (congestive) heart failure: Secondary | ICD-10-CM | POA: Diagnosis present

## 2023-05-31 DIAGNOSIS — I2510 Atherosclerotic heart disease of native coronary artery without angina pectoris: Secondary | ICD-10-CM | POA: Diagnosis present

## 2023-05-31 DIAGNOSIS — Z7951 Long term (current) use of inhaled steroids: Secondary | ICD-10-CM

## 2023-05-31 DIAGNOSIS — Z79899 Other long term (current) drug therapy: Secondary | ICD-10-CM

## 2023-05-31 DIAGNOSIS — E785 Hyperlipidemia, unspecified: Secondary | ICD-10-CM | POA: Diagnosis present

## 2023-05-31 DIAGNOSIS — F17220 Nicotine dependence, chewing tobacco, uncomplicated: Secondary | ICD-10-CM | POA: Diagnosis not present

## 2023-05-31 DIAGNOSIS — Z7982 Long term (current) use of aspirin: Secondary | ICD-10-CM

## 2023-05-31 DIAGNOSIS — E782 Mixed hyperlipidemia: Secondary | ICD-10-CM | POA: Diagnosis not present

## 2023-05-31 DIAGNOSIS — F1010 Alcohol abuse, uncomplicated: Secondary | ICD-10-CM | POA: Diagnosis present

## 2023-05-31 DIAGNOSIS — K5520 Angiodysplasia of colon without hemorrhage: Secondary | ICD-10-CM

## 2023-05-31 DIAGNOSIS — K921 Melena: Secondary | ICD-10-CM | POA: Diagnosis not present

## 2023-05-31 DIAGNOSIS — Z87891 Personal history of nicotine dependence: Secondary | ICD-10-CM

## 2023-05-31 DIAGNOSIS — Z951 Presence of aortocoronary bypass graft: Secondary | ICD-10-CM

## 2023-05-31 DIAGNOSIS — K922 Gastrointestinal hemorrhage, unspecified: Secondary | ICD-10-CM | POA: Diagnosis present

## 2023-05-31 DIAGNOSIS — I493 Ventricular premature depolarization: Secondary | ICD-10-CM | POA: Diagnosis present

## 2023-05-31 DIAGNOSIS — I639 Cerebral infarction, unspecified: Secondary | ICD-10-CM | POA: Diagnosis present

## 2023-05-31 DIAGNOSIS — J449 Chronic obstructive pulmonary disease, unspecified: Secondary | ICD-10-CM | POA: Diagnosis present

## 2023-05-31 DIAGNOSIS — Z9049 Acquired absence of other specified parts of digestive tract: Secondary | ICD-10-CM

## 2023-05-31 LAB — CBC
HCT: 24.9 % — ABNORMAL LOW (ref 39.0–52.0)
HCT: 25 % — ABNORMAL LOW (ref 39.0–52.0)
HCT: 30.2 % — ABNORMAL LOW (ref 39.0–52.0)
Hemoglobin: 7.1 g/dL — ABNORMAL LOW (ref 13.0–17.0)
Hemoglobin: 7.6 g/dL — ABNORMAL LOW (ref 13.0–17.0)
Hemoglobin: 9 g/dL — ABNORMAL LOW (ref 13.0–17.0)
MCH: 22.3 pg — ABNORMAL LOW (ref 26.0–34.0)
MCH: 23.4 pg — ABNORMAL LOW (ref 26.0–34.0)
MCH: 23.5 pg — ABNORMAL LOW (ref 26.0–34.0)
MCHC: 28.5 g/dL — ABNORMAL LOW (ref 30.0–36.0)
MCHC: 29.8 g/dL — ABNORMAL LOW (ref 30.0–36.0)
MCHC: 30.4 g/dL (ref 30.0–36.0)
MCV: 77.4 fL — ABNORMAL LOW (ref 80.0–100.0)
MCV: 78.1 fL — ABNORMAL LOW (ref 80.0–100.0)
MCV: 78.4 fL — ABNORMAL LOW (ref 80.0–100.0)
Platelets: 381 10*3/uL (ref 150–400)
Platelets: 401 10*3/uL — ABNORMAL HIGH (ref 150–400)
Platelets: 458 10*3/uL — ABNORMAL HIGH (ref 150–400)
RBC: 3.19 MIL/uL — ABNORMAL LOW (ref 4.22–5.81)
RBC: 3.23 MIL/uL — ABNORMAL LOW (ref 4.22–5.81)
RBC: 3.85 MIL/uL — ABNORMAL LOW (ref 4.22–5.81)
RDW: 16.9 % — ABNORMAL HIGH (ref 11.5–15.5)
RDW: 17.2 % — ABNORMAL HIGH (ref 11.5–15.5)
RDW: 17.2 % — ABNORMAL HIGH (ref 11.5–15.5)
WBC: 10.6 10*3/uL — ABNORMAL HIGH (ref 4.0–10.5)
WBC: 12.3 10*3/uL — ABNORMAL HIGH (ref 4.0–10.5)
WBC: 12.6 10*3/uL — ABNORMAL HIGH (ref 4.0–10.5)
nRBC: 0 % (ref 0.0–0.2)
nRBC: 0 % (ref 0.0–0.2)
nRBC: 0 % (ref 0.0–0.2)

## 2023-05-31 LAB — COMPREHENSIVE METABOLIC PANEL
ALT: 13 U/L (ref 0–44)
AST: 21 U/L (ref 15–41)
Albumin: 3.9 g/dL (ref 3.5–5.0)
Alkaline Phosphatase: 54 U/L (ref 38–126)
Anion gap: 10 (ref 5–15)
BUN: 10 mg/dL (ref 8–23)
CO2: 24 mmol/L (ref 22–32)
Calcium: 9 mg/dL (ref 8.9–10.3)
Chloride: 102 mmol/L (ref 98–111)
Creatinine, Ser: 0.9 mg/dL (ref 0.61–1.24)
GFR, Estimated: >60 mL/min (ref >60)
Glucose, Bld: 115 mg/dL — ABNORMAL HIGH (ref 70–99)
Potassium: 4 mmol/L (ref 3.5–5.1)
Sodium: 136 mmol/L (ref 135–145)
Total Bilirubin: 1.3 mg/dL — ABNORMAL HIGH (ref 0.3–1.2)
Total Protein: 7.9 g/dL (ref 6.5–8.1)

## 2023-05-31 LAB — PROTIME-INR
INR: 1.2 (ref 0.8–1.2)
Prothrombin Time: 14.9 seconds (ref 11.4–15.2)

## 2023-05-31 LAB — BRAIN NATRIURETIC PEPTIDE: B Natriuretic Peptide: 973.5 pg/mL — ABNORMAL HIGH (ref 0.0–100.0)

## 2023-05-31 LAB — BPAM RBC
ISSUE DATE / TIME: 202406131440
Unit Type and Rh: 600

## 2023-05-31 LAB — ETHANOL: Alcohol, Ethyl (B): <10 mg/dL (ref <10)

## 2023-05-31 LAB — APTT: aPTT: 31 seconds (ref 24–36)

## 2023-05-31 LAB — LIPASE, BLOOD: Lipase: 34 U/L (ref 11–51)

## 2023-05-31 LAB — TYPE AND SCREEN

## 2023-05-31 LAB — PREPARE RBC (CROSSMATCH)

## 2023-05-31 MED ORDER — SODIUM CHLORIDE 0.9 % IV SOLN
250.0000 mg | Freq: Every day | INTRAVENOUS | Status: DC
  Administered 2023-05-31 – 2023-06-02 (×3): 250 mg via INTRAVENOUS
  Filled 2023-05-31 (×3): qty 20

## 2023-05-31 MED ORDER — SODIUM CHLORIDE 0.9 % IV SOLN: INTRAVENOUS | Status: DC

## 2023-05-31 MED ORDER — PANTOPRAZOLE 80MG IVPB - SIMPLE MED
80.0000 mg | Freq: Once | INTRAVENOUS | Status: DC
  Administered 2023-05-31: 80 mg via INTRAVENOUS
  Filled 2023-05-31: qty 100

## 2023-05-31 MED ORDER — DONEPEZIL HCL 5 MG PO TABS
10.0000 mg | ORAL_TABLET | Freq: Every day | ORAL | Status: DC
  Administered 2023-05-31 – 2023-06-02 (×3): 10 mg via ORAL
  Filled 2023-05-31 (×3): qty 2

## 2023-05-31 MED ORDER — LORAZEPAM 2 MG/ML IJ SOLN: 0.0000 mg | Freq: Two times a day (BID) | INTRAMUSCULAR | Status: DC

## 2023-05-31 MED ORDER — METOPROLOL SUCCINATE ER 25 MG PO TB24
25.0000 mg | ORAL_TABLET | Freq: Every day | ORAL | Status: DC
  Administered 2023-05-31 – 2023-06-01 (×2): 25 mg via ORAL
  Filled 2023-05-31 (×2): qty 1

## 2023-05-31 MED ORDER — DM-GUAIFENESIN ER 30-600 MG PO TB12: 1.0000 | ORAL_TABLET | Freq: Two times a day (BID) | ORAL | Status: DC | PRN

## 2023-05-31 MED ORDER — UMECLIDINIUM BROMIDE 62.5 MCG/ACT IN AEPB
1.0000 | INHALATION_SPRAY | Freq: Every day | RESPIRATORY_TRACT | Status: DC
  Administered 2023-06-01 – 2023-06-02 (×2): 1 via RESPIRATORY_TRACT
  Filled 2023-05-31 (×2): qty 7

## 2023-05-31 MED ORDER — LORATADINE 10 MG PO TABS
10.0000 mg | ORAL_TABLET | Freq: Every day | ORAL | Status: DC
  Administered 2023-05-31 – 2023-06-02 (×3): 10 mg via ORAL
  Filled 2023-05-31 (×3): qty 1

## 2023-05-31 MED ORDER — SODIUM CHLORIDE 0.9% IV SOLUTION
Freq: Once | INTRAVENOUS | Status: AC
  Filled 2023-05-31: qty 250

## 2023-05-31 MED ORDER — FLUTICASONE PROPIONATE 50 MCG/ACT NA SUSP
2.0000 | Freq: Every day | NASAL | Status: DC
  Administered 2023-06-01 – 2023-06-02 (×2): 2 via NASAL
  Filled 2023-05-31 (×2): qty 16

## 2023-05-31 MED ORDER — HYDRALAZINE HCL 20 MG/ML IJ SOLN: 5.0000 mg | INTRAMUSCULAR | Status: DC | PRN

## 2023-05-31 MED ORDER — FUROSEMIDE 10 MG/ML IJ SOLN
20.0000 mg | Freq: Once | INTRAMUSCULAR | Status: AC
  Administered 2023-05-31: 20 mg via INTRAVENOUS
  Filled 2023-05-31: qty 4

## 2023-05-31 MED ORDER — PANTOPRAZOLE INFUSION (NEW) - SIMPLE MED
8.0000 mg/h | INTRAVENOUS | Status: DC
  Administered 2023-05-31 – 2023-06-02 (×5): 8 mg/h via INTRAVENOUS
  Filled 2023-05-31 (×5): qty 100

## 2023-05-31 MED ORDER — FUROSEMIDE 10 MG/ML IJ SOLN
20.0000 mg | Freq: Two times a day (BID) | INTRAMUSCULAR | Status: DC
  Administered 2023-05-31 – 2023-06-02 (×4): 20 mg via INTRAVENOUS
  Filled 2023-05-31 (×4): qty 4

## 2023-05-31 MED ORDER — AMLODIPINE BESYLATE 5 MG PO TABS
5.0000 mg | ORAL_TABLET | Freq: Every day | ORAL | Status: DC
  Administered 2023-05-31 – 2023-06-01 (×2): 5 mg via ORAL
  Filled 2023-05-31 (×2): qty 1

## 2023-05-31 MED ORDER — MELATONIN 5 MG PO TABS
10.0000 mg | ORAL_TABLET | Freq: Every day | ORAL | Status: DC
  Administered 2023-05-31 – 2023-06-01 (×2): 10 mg via ORAL
  Filled 2023-05-31 (×2): qty 2

## 2023-05-31 MED ORDER — ACETAMINOPHEN 325 MG PO TABS: 650.0000 mg | ORAL_TABLET | Freq: Four times a day (QID) | ORAL | Status: DC | PRN

## 2023-05-31 MED ORDER — LORAZEPAM 2 MG/ML IJ SOLN
0.0000 mg | Freq: Four times a day (QID) | INTRAMUSCULAR | Status: AC
  Administered 2023-05-31: 2 mg via INTRAVENOUS
  Administered 2023-06-01 – 2023-06-02 (×2): 1 mg via INTRAVENOUS
  Filled 2023-05-31 (×3): qty 1

## 2023-05-31 MED ORDER — LORAZEPAM 1 MG PO TABS: 1.0000 mg | ORAL_TABLET | ORAL | Status: DC | PRN

## 2023-05-31 MED ORDER — THIAMINE HCL 100 MG/ML IJ SOLN: 100.0000 mg | Freq: Every day | INTRAMUSCULAR | Status: DC

## 2023-05-31 MED ORDER — ONDANSETRON HCL 4 MG/2ML IJ SOLN: 4.0000 mg | Freq: Three times a day (TID) | INTRAMUSCULAR | Status: DC | PRN

## 2023-05-31 MED ORDER — VITAMIN B-12 1000 MCG PO TABS
1000.0000 ug | ORAL_TABLET | Freq: Every day | ORAL | Status: DC
  Administered 2023-05-31 – 2023-06-02 (×3): 1000 ug via ORAL
  Filled 2023-05-31 (×3): qty 1

## 2023-05-31 MED ORDER — ESCITALOPRAM OXALATE 10 MG PO TABS
5.0000 mg | ORAL_TABLET | Freq: Every day | ORAL | Status: DC
  Administered 2023-05-31 – 2023-06-02 (×3): 5 mg via ORAL
  Filled 2023-05-31 (×3): qty 1

## 2023-05-31 MED ORDER — NICOTINE 21 MG/24HR TD PT24
21.0000 mg | MEDICATED_PATCH | Freq: Every day | TRANSDERMAL | Status: DC
  Administered 2023-06-01 – 2023-06-02 (×2): 21 mg via TRANSDERMAL
  Filled 2023-05-31 (×2): qty 1

## 2023-05-31 MED ORDER — FLUTICASONE FUROATE-VILANTEROL 100-25 MCG/ACT IN AEPB
1.0000 | INHALATION_SPRAY | Freq: Every day | RESPIRATORY_TRACT | Status: DC
  Administered 2023-06-01 – 2023-06-02 (×2): 1 via RESPIRATORY_TRACT
  Filled 2023-05-31 (×2): qty 28

## 2023-05-31 MED ORDER — THIAMINE MONONITRATE 100 MG PO TABS
100.0000 mg | ORAL_TABLET | Freq: Every day | ORAL | Status: DC
  Administered 2023-05-31 – 2023-06-02 (×3): 100 mg via ORAL
  Filled 2023-05-31 (×3): qty 1

## 2023-05-31 MED ORDER — ALBUTEROL SULFATE (2.5 MG/3ML) 0.083% IN NEBU: 3.0000 mL | INHALATION_SOLUTION | RESPIRATORY_TRACT | Status: DC | PRN

## 2023-05-31 MED ORDER — NALTREXONE HCL 50 MG PO TABS
50.0000 mg | ORAL_TABLET | Freq: Every day | ORAL | Status: DC
  Administered 2023-06-01 – 2023-06-02 (×2): 50 mg via ORAL
  Filled 2023-05-31 (×3): qty 1

## 2023-05-31 MED ORDER — PANTOPRAZOLE 80MG IVPB - SIMPLE MED: 80.0000 mg | Freq: Once | INTRAVENOUS | Status: DC

## 2023-05-31 MED ORDER — ADULT MULTIVITAMIN W/MINERALS CH
1.0000 | ORAL_TABLET | Freq: Every day | ORAL | Status: DC
  Administered 2023-05-31 – 2023-06-02 (×3): 1 via ORAL
  Filled 2023-05-31 (×3): qty 1

## 2023-05-31 MED ORDER — FOLIC ACID 1 MG PO TABS
1.0000 mg | ORAL_TABLET | Freq: Every day | ORAL | Status: DC
  Administered 2023-05-31 – 2023-06-02 (×3): 1 mg via ORAL
  Filled 2023-05-31 (×3): qty 1

## 2023-05-31 MED ORDER — PRAVASTATIN SODIUM 20 MG PO TABS
20.0000 mg | ORAL_TABLET | Freq: Every day | ORAL | Status: DC
  Administered 2023-05-31 – 2023-06-01 (×2): 20 mg via ORAL
  Filled 2023-05-31 (×2): qty 1

## 2023-05-31 MED ORDER — LORAZEPAM 2 MG/ML IJ SOLN: 1.0000 mg | INTRAMUSCULAR | Status: DC | PRN

## 2023-05-31 MED ORDER — PANTOPRAZOLE SODIUM 40 MG IV SOLR: 40.0000 mg | Freq: Two times a day (BID) | INTRAVENOUS | Status: DC

## 2023-05-31 NOTE — ED Triage Notes (Addendum)
Pt here with rectal bleeding, pt states his bowels movements have been a dark color. Pt states he was just discharged 2 weeks ago for the same complaint. Pt also has bilateral feet swelling. Pt denies pain elsewhere. EMS states pt hgb was 6.4   159/90 96 100% RA

## 2023-05-31 NOTE — Consult Note (Signed)
Douglas Repress, MD [address]  [address]  [place], [place] [address]  Main: [phone]  Fax: [phone] Pager: [phone]   Consultation  Referring Provider:     No ref. provider found Primary Care Physician:  Jodi Marble, NP Primary Gastroenterologist:  Dr. Midge Minium       Reason for Consultation: Melena, anemia  Date of Admission:  05/31/2023 Date of Consultation:  05/31/2023         HPI:   [name] is a 62 y.o. male with medical history significant for COPD, HTN, CAD s/p CABG 2013, prior CVA s/p left carotid endarterectomy, cognitive impairment, alcohol use disorder, hospitalized in November 2023 for symptomatic anemia of 6.3 with negative EGD, last colonoscopy in 01/2022, subcentimeter polyps removed.  Patient readmitted to [facility] on 5/27 secondary to symptomatic anemia and melena.  Underwent video capsule endoscopy which revealed active bleeding in the proximal small bowel.  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  Patient was discharged home on 5/31.  His hemoglobin at that time was 7.9.  Presented to ER today secondary to recurrence of dark stools as well as hemoglobin of 6.4 as reported by EMS, with bilateral swelling of legs.  Current hemoglobin 7.1, MCV 78.1, thrombocytosis 401.  He has severe iron deficiency, unclear if iron replacement therapy has been done.  Patient does not take oral iron supplements.  He does acknowledge drinking half can of 12 ounce beer yesterday, first time since his last admission.  Noticed just black stool this morning which brought him to the emergency room.  Currently receiving first unit of PRBCs in the ER  NSAIDs: None  Antiplts/Anticoagulants/Anti thrombotics: Aspirin 81  Past Medical History:  Diagnosis Date   COPD (chronic obstructive pulmonary disease) (HCC)    CVA (cerebral infarction)    Headache    mild, since stroke 2012   Hypertension    MI  (myocardial infarction) (HCC) 05/09/2012   Reading difficulty    pt reports he reads at "about a second grade level"   Stroke [facility]) 2012   numbness to left hand    Wears dentures    full upper and lower    Past Surgical History:  Procedure Laterality Date   CAROTID ENDARTERECTOMY Left    2012 or 2013   CHOLECYSTECTOMY N/A 07/26/2016   Procedure: LAPAROSCOPIC CHOLECYSTECTOMY;  Surgeon: Lattie Haw, MD;  Location: [facility] ORS;  Service: General;  Laterality: N/A;   COLONOSCOPY WITH PROPOFOL N/A 02/09/2022   Procedure: COLONOSCOPY WITH PROPOFOL;  Surgeon: Midge Minium, MD;  Location: [facility] ENDOSCOPY;  Service: Endoscopy;  Laterality: N/A;   CORONARY ARTERY BYPASS GRAFT  05/10/2012   3 vessel   ENTEROSCOPY N/A 05/16/2023   Procedure: ENTEROSCOPY;  Surgeon: Wyline Mood, MD;  Location: [facility] ENDOSCOPY;  Service: Gastroenterology;  Laterality: N/A;   ESOPHAGOGASTRODUODENOSCOPY  05/13/2023   Procedure: ESOPHAGOGASTRODUODENOSCOPY (EGD);  Surgeon: Midge Minium, MD;  Location: [facility] ENDOSCOPY;  Service: Endoscopy;;   ESOPHAGOGASTRODUODENOSCOPY (EGD) WITH PROPOFOL N/A 10/28/2022   Procedure: ESOPHAGOGASTRODUODENOSCOPY (EGD) WITH PROPOFOL;  Surgeon: Jaynie Collins, DO;  Location: [facility] ENDOSCOPY;  Service: Gastroenterology;  Laterality: N/A;   GIVENS CAPSULE STUDY  05/13/2023   Procedure: GIVENS CAPSULE STUDY;  Surgeon: Midge Minium, MD;  Location: [facility] ENDOSCOPY;  Service: Endoscopy;;     Current Facility-Administered Medications:    acetaminophen (TYLENOL) tablet 650 mg, 650 mg, Oral, Q6H PRN, Lorretta Harp, MD   albuterol (PROVENTIL) (2.5 MG/3ML)  0.083% nebulizer solution 3 mL, 3 mL, Inhalation, Q4H PRN, Lorretta Harp, MD   amLODipine (NORVASC) tablet 5 mg, 5 mg, Oral, Daily, Lorretta Harp, MD   cyanocobalamin (VITAMIN B12) tablet 1,000 mcg, 1,000 mcg, Oral, Daily, Lorretta Harp, MD   dextromethorphan-guaiFENesin (MUCINEX DM) 30-600 MG per 12 hr tablet 1 tablet, 1 tablet, Oral, BID PRN, Lorretta Harp, MD    donepezil (ARICEPT) tablet 10 mg, 10 mg, Oral, Daily, Lorretta Harp, MD   escitalopram (LEXAPRO) tablet 5 mg, 5 mg, Oral, Daily, Lorretta Harp, MD   ferric gluconate (FERRLECIT) 250 mg in sodium chloride 0.9 % 250 mL IVPB, 250 mg, Intravenous, Daily, Kowen Kluth, Loel Dubonnet, MD   fluticasone (FLONASE) 50 MCG/ACT nasal spray 2 spray, 2 spray, Each Nare, Daily, Lorretta Harp, MD   fluticasone furoate-vilanterol (BREO ELLIPTA) 100-25 MCG/ACT 1 puff, 1 puff, Inhalation, Daily **AND** umeclidinium bromide (INCRUSE ELLIPTA) 62.5 MCG/ACT 1 puff, 1 puff, Inhalation, Daily, Lorretta Harp, MD   folic acid (FOLVITE) tablet 1 mg, 1 mg, Oral, Daily, Lorretta Harp, MD, 1 mg at 05/31/23 1550   furosemide (LASIX) injection 20 mg, 20 mg, Intravenous, BID, Lorretta Harp, MD   hydrALAZINE (APRESOLINE) injection 5 mg, 5 mg, Intravenous, Q2H PRN, Lorretta Harp, MD   loratadine (CLARITIN) tablet 10 mg, 10 mg, Oral, Daily, Lorretta Harp, MD   LORazepam (ATIVAN) injection 0-4 mg, 0-4 mg, Intravenous, Q6H **FOLLOWED BY** [START ON 06/02/2023] LORazepam (ATIVAN) injection 0-4 mg, 0-4 mg, Intravenous, Q12H, Lorretta Harp, MD   LORazepam (ATIVAN) tablet 1-4 mg, 1-4 mg, Oral, Q1H PRN **OR** LORazepam (ATIVAN) injection 1-4 mg, 1-4 mg, Intravenous, Q1H PRN, Lorretta Harp, MD   melatonin tablet 10 mg, 10 mg, Oral, QHS, Lorretta Harp, MD   metoprolol succinate (TOPROL-XL) 24 hr tablet 25 mg, 25 mg, Oral, Daily, Lorretta Harp, MD   multivitamin with minerals tablet 1 tablet, 1 tablet, Oral, Daily, Lorretta Harp, MD, 1 tablet at 05/31/23 1550   naltrexone (DEPADE) tablet 50 mg, 50 mg, Oral, Daily, Lorretta Harp, MD   nicotine (NICODERM CQ - dosed in mg/24 hours) patch 21 mg, 21 mg, Transdermal, Daily, Lorretta Harp, MD   ondansetron [facility]) injection 4 mg, 4 mg, Intravenous, Q8H PRN, Lorretta Harp, MD   [START ON 06/04/2023] pantoprazole (PROTONIX) injection 40 mg, 40 mg, Intravenous, Q12H, Lorretta Harp, MD   pantoprozole (PROTONIX) 80 mg /NS 100 mL infusion, 8 mg/hr, Intravenous,  Continuous, Lorretta Harp, MD, Last Rate: 10 mL/hr at 05/31/23 1549, 8 mg/hr at 05/31/23 1549   pravastatin (PRAVACHOL) tablet 20 mg, 20 mg, Oral, QHS, Lorretta Harp, MD   thiamine (VITAMIN B1) tablet 100 mg, 100 mg, Oral, Daily, 100 mg at 05/31/23 1550 **OR** thiamine (VITAMIN B1) injection 100 mg, 100 mg, Intravenous, Daily, Lorretta Harp, MD   Family History  Problem Relation Age of Onset   Pneumonia Mother      Social History   Tobacco Use   Smoking status: Former    Packs/day: 4.00    Years: 43.00    Additional pack years: 0.00    Total pack years: 172.00    Types: Cigarettes    Quit date: 1993    Years since quitting: 31.4   Smokeless tobacco: Current    Types: Snuff   Tobacco comments:    changed to dip  Vaping Use   Vaping Use: Never used  Substance Use Topics   Alcohol use: Yes    Alcohol/week: 26.0 standard drinks of alcohol    Types: 26 Cans of beer per week  Drug use: No    Allergies as of 05/31/2023 - Review Complete 05/31/2023  Allergen Reaction Noted   Other  01/17/2022    Review of Systems:    All systems reviewed and negative except where noted in HPI.   Physical Exam:  Vital signs in last 24 hours: Temp:  [98.2 F (36.8 C)-98.3 F (36.8 C)] 98.3 F (36.8 C) (06/13 1515) Pulse Rate:  [67-98] 75 (06/13 1645) Resp:  [18] 18 (06/13 1645) BP: (117-154)/(60-102) 117/64 (06/13 1645) SpO2:  [90 %-100 %] 97 % (06/13 1645) Weight:  [94.3 kg] 94.3 kg (06/13 1127)   General:   Pleasant, cooperative in NAD Head:  Normocephalic and atraumatic. Eyes:   No icterus.   Conjunctiva pale. PERRLA. Ears:  Normal auditory acuity. Neck:  Supple; no masses or thyroidomegaly Lungs: Respirations even and unlabored. Lungs clear to auscultation bilaterally.   No wheezes, crackles, or rhonchi.  Heart:  Regular rate and rhythm;  Without murmur, clicks, rubs or gallops Abdomen:  Soft, nondistended, nontender. Normal bowel sounds. No appreciable masses or hepatomegaly.  No  rebound or guarding.  Rectal:  Not performed. Msk:  Symmetrical without gross deformities.  Strength generalized weakness Extremities:  Without edema, cyanosis or clubbing. Neurologic:  Alert and oriented x3;  grossly normal neurologically. Skin:  Intact without significant lesions or rashes. Psych:  Alert and cooperative. Normal affect.  LAB RESULTS:    Latest Ref Rng & Units 05/31/2023   11:34 AM 05/18/2023    4:14 AM 05/17/2023    4:19 AM  CBC  WBC 4.0 - 10.5 K/uL 10.6  9.6  12.3   Hemoglobin 13.0 - 17.0 g/dL 7.1  7.9  8.4   Hematocrit 39.0 - 52.0 % 24.9  26.4  28.4   Platelets 150 - 400 K/uL 401  334  348     BMET    Latest Ref Rng & Units 05/31/2023   11:34 AM 05/18/2023    4:14 AM 05/17/2023    4:19 AM  BMP  Glucose 70 - 99 mg/dL 161  096  92   BUN 8 - 23 mg/dL 10  9  8    Creatinine 0.61 - 1.24 mg/dL 0.45  4.09  8.11   Sodium 135 - 145 mmol/L 136  135  137   Potassium 3.5 - 5.1 mmol/L 4.0  4.0  4.4   Chloride 98 - 111 mmol/L 102  102  102   CO2 22 - 32 mmol/L 24  25  26    Calcium 8.9 - 10.3 mg/dL 9.0  8.6  8.5     LFT    Latest Ref Rng & Units 05/31/2023   11:34 AM 08/14/2022    4:30 PM 06/05/2021    4:50 AM  Hepatic Function  Total Protein 6.5 - 8.1 g/dL 7.9  8.6  7.4   Albumin 3.5 - 5.0 g/dL 3.9  3.5  3.4   AST 15 - 41 U/L 21  34  65   ALT 0 - 44 U/L 13  22  54   Alk Phosphatase 38 - 126 U/L 54  55  40   Total Bilirubin 0.3 - 1.2 mg/dL 1.3  1.1  1.3      STUDIES: No results found.    Impression / Plan:   Keenan Delauder is a 62 y.o. 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 effective 5 AM tomorrow  I have discussed alternative options, risks & benefits,  which include, but are not limited to, bleeding, infection, perforation,respiratory complication & drug reaction.  The patient agrees with this plan & written consent will be obtained.     Thank you for involving me in the care of this patient.      LOS: 0 days   Lannette Donath, MD  05/31/2023, 4:59 PM    Note: This dictation was prepared with Dragon dictation along with smaller phrase technology. Any transcriptional errors that result from this process are unintentional.

## 2023-05-31 NOTE — H&P (Signed)
History and Physical    [name] ZOX:[id number] DOB: 11-08-1961 DOA: 05/31/2023  Referring MD/NP/PA:   PCP: Jodi Marble, NP   Patient coming from:  The patient is coming from home.     Chief Complaint: dark stool  HPI: [name] is a 62 y.o. male with medical history significant of GIB, alcohol abuse, COPD, hypertension, CAD with prior CABG, prior CVA, HLD, stroke, dementia, dCHF, who presents with dark stool.  Pt was recently hospitalized from 5/26 - 5/31 due to GI bleeding.  Patient received 2 unit of blood transfusion with hemoglobin increased from 6.2 to 8.4 at discharge. Pt had EGD 5/26 which showed normal stomach. 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 Pt reports melanotic stool that is black and tarry today. He said that he had dark stool 3 days ago.  No nausea, vomiting, diarrhea or abdominal pain.  No symptoms of UTI.  Denies chest pain, cough. Patient states that he has shortness of breath on exertion. No fever or chills.   Data reviewed independently and ED Course: pt was found to have Hgb 7.1, WBC 10.6, BNP 973, temperature normal, blood pressure 124/70, heart rate 67, RR 18, oxygen saturation 100% on room air. Pt is admitted to tele bed as inpt. Dr. Allegra Lai of IG is consulted.   EKG: I have personally reviewed.  Sinus rhythm, QTc 463, LAE, PVC.   Review of Systems:   General: no fevers, chills, no body weight gain, has fatigue HEENT: no blurry vision, hearing changes or sore throat Respiratory: has dyspnea, no coughing, wheezing CV: no chest pain, no palpitations GI: no nausea, vomiting, abdominal pain, diarrhea, constipation. Has dark stool GU: no dysuria, burning on urination, increased urinary frequency, hematuria  Ext: has leg edema Neuro: no unilateral weakness, numbness, or tingling, no vision change or hearing loss Skin: no rash, no skin tear. MSK: No muscle spasm, no deformity, no limitation of  range of movement in spin Heme: No easy bruising.  Travel history: No recent long distant travel.   Allergy:  Allergies  Allergen Reactions   Other     Welbutrin - has paranoia     Past Medical History:  Diagnosis Date   COPD (chronic obstructive pulmonary disease) (HCC)    CVA (cerebral infarction)    Headache    mild, since stroke 2012   Hypertension    MI (myocardial infarction) (HCC) 05/09/2012   Reading difficulty    pt reports he reads at "about a second grade level"   Stroke [facility]) 2012   numbness to left hand    Wears dentures    full upper and lower    Past Surgical History:  Procedure Laterality Date   CAROTID ENDARTERECTOMY Left    2012 or 2013   CHOLECYSTECTOMY N/A 07/26/2016   Procedure: LAPAROSCOPIC CHOLECYSTECTOMY;  Surgeon: Lattie Haw, MD;  Location: [facility] ORS;  Service: General;  Laterality: N/A;   COLONOSCOPY WITH PROPOFOL N/A 02/09/2022   Procedure: COLONOSCOPY WITH PROPOFOL;  Surgeon: Midge Minium, MD;  Location: [facility] ENDOSCOPY;  Service: Endoscopy;  Laterality: N/A;   CORONARY ARTERY BYPASS GRAFT  05/10/2012   3 vessel   ENTEROSCOPY N/A 05/16/2023   Procedure: ENTEROSCOPY;  Surgeon: Wyline Mood, MD;  Location: [facility] ENDOSCOPY;  Service: Gastroenterology;  Laterality: N/A;   ESOPHAGOGASTRODUODENOSCOPY  05/13/2023   Procedure: ESOPHAGOGASTRODUODENOSCOPY (EGD);  Surgeon: Midge Minium, MD;  Location: [facility] ENDOSCOPY;  Service: Endoscopy;;   ESOPHAGOGASTRODUODENOSCOPY (EGD) WITH PROPOFOL  N/A 10/28/2022   Procedure: ESOPHAGOGASTRODUODENOSCOPY (EGD) WITH PROPOFOL;  Surgeon: Jaynie Collins, DO;  Location: [facility] ENDOSCOPY;  Service: Gastroenterology;  Laterality: N/A;   GIVENS CAPSULE STUDY  05/13/2023   Procedure: GIVENS CAPSULE STUDY;  Surgeon: Midge Minium, MD;  Location: [facility] ENDOSCOPY;  Service: Endoscopy;;    Social History:  reports that he quit smoking about 31 years ago. His smoking use included cigarettes. He has a 172.00 pack-year smoking  history. His smokeless tobacco use includes snuff. He reports current alcohol use of about 26.0 standard drinks of alcohol per week. He reports that he does not use drugs.  Family History:  Family History  Problem Relation Age of Onset   Pneumonia Mother      Prior to Admission medications   Medication Sig Start Date End Date Taking? Authorizing Provider  albuterol (VENTOLIN HFA) 108 (90 Base) MCG/ACT inhaler Inhale 2 puffs into the lungs every 4 (four) hours as needed for wheezing or shortness of breath. 08/14/22   Raechel Chute, MD  albuterol (VENTOLIN HFA) 108 (90 Base) MCG/ACT inhaler Inhale 2 puffs into the lungs every 6 (six) hours as needed for wheezing or shortness of breath. 03/13/23   Willy Eddy, MD  amLODipine (NORVASC) 5 MG tablet Take 5 mg by mouth daily. 04/27/23   [provider]  aspirin EC (ASPIRIN LOW DOSE) 81 MG tablet Take 81 mg by mouth daily. 04/27/23   [provider]  cetirizine (ZYRTEC) 10 MG tablet Take 10 mg by mouth daily. 08/04/22   [provider]  donepezil (ARICEPT) 10 MG tablet Take 10 mg by mouth daily.     [provider]  escitalopram (LEXAPRO) 5 MG tablet Take 5 mg by mouth daily. 04/19/23   [provider]  fluticasone (FLONASE) 50 MCG/ACT nasal spray Place 2 sprays into both nostrils daily. 08/04/22   [provider]  Fluticasone-Umeclidin-Vilant (TRELEGY ELLIPTA) 100-62.5-25 MCG/ACT AEPB Inhale 1 puff into the lungs daily. 05/18/23 06/17/23  Tresa Moore, MD  Melatonin 10 MG CAPS Take 1 capsule by mouth at bedtime. 08/04/22   [provider]  metoprolol succinate (TOPROL-XL) 25 MG 24 hr tablet Take 25 mg by mouth daily. 05/24/21   [provider]  mirtazapine (REMERON) 15 MG tablet Take 15 mg by mouth at bedtime. 05/24/21   [provider]  naltrexone (DEPADE) 50 MG tablet Take 50 mg by mouth daily. 04/16/23   [provider]  omeprazole (PRILOSEC) 20 MG capsule Take  1 capsule (20 mg total) by mouth 2 (two) times daily before a meal. 05/18/23   Sreenath, Sudheer B, MD  pravastatin (PRAVACHOL) 20 MG tablet Take 20 mg by mouth at bedtime.     [provider]  vitamin B-12 (CYANOCOBALAMIN) 1000 MCG tablet Take 1 tablet (1,000 mcg total) by mouth daily. 06/05/21   Rolly Salter, MD    Physical Exam: Vitals:   05/31/23 1509 05/31/23 1515 05/31/23 1645 05/31/23 1705  BP:  (!) 154/90 117/64 (!) 140/78  Pulse: 79 98 75 72  Resp:  18 18 16   Temp:  98.3 F (36.8 C)  98.9 F (37.2 C)  TempSrc:      SpO2: 90% 100% 97% 100%  Weight:      Height:       General: Not in acute distress. Pale looking HEENT:       Eyes: PERRL, EOMI, no jaundice       ENT: No discharge from the ears and nose, no pharynx  injection, no tonsillar enlargement.        Neck: No JVD, no bruit, no mass felt. Heme: No neck lymph node enlargement. Cardiac: S1/S2, RRR, No murmurs, No gallops or rubs. Respiratory: No rales, wheezing, rhonchi or rubs. GI: Soft, nondistended, nontender, no rebound pain, no organomegaly, BS present. GU: No hematuria Ext: 1+ pitting leg edema bilaterally. 1+DP/PT pulse bilaterally. Musculoskeletal: No joint deformities, No joint redness or warmth, no limitation of ROM in spin. Skin: No rashes.  Neuro: Alert, oriented X3, cranial nerves II-XII grossly intact, moves all extremities normally.  Psych: Patient is not psychotic, no suicidal or hemocidal ideation.  Labs on Admission: I have personally reviewed following labs and imaging studies  CBC: Recent Labs  Lab 05/31/23 1134  WBC 10.6*  HGB 7.1*  HCT 24.9*  MCV 78.1*  PLT 401*   Basic Metabolic Panel: Recent Labs  Lab 05/31/23 1134  NA 136  K 4.0  CL 102  CO2 24  GLUCOSE 115*  BUN 10  CREATININE 0.90  CALCIUM 9.0   GFR: Estimated Creatinine Clearance: 98.9 mL/min (by C-G formula based on SCr of 0.9 mg/dL). Liver Function Tests: Recent Labs  Lab 05/31/23 1134  AST 21  ALT 13   ALKPHOS 54  BILITOT 1.3*  PROT 7.9  ALBUMIN 3.9   Recent Labs  Lab 05/31/23 1134  LIPASE 34   No results for input(s): "AMMONIA" in the last 168 hours. Coagulation Profile: Recent Labs  Lab 05/31/23 1134  INR 1.2   Cardiac Enzymes: No results for input(s): "CKTOTAL", "CKMB", "CKMBINDEX", "TROPONINI" in the last 168 hours. BNP (last 3 results) No results for input(s): "PROBNP" in the last 8760 hours. HbA1C: No results for input(s): "HGBA1C" in the last 72 hours. CBG: No results for input(s): "GLUCAP" in the last 168 hours. Lipid Profile: No results for input(s): "CHOL", "HDL", "LDLCALC", "TRIG", "CHOLHDL", "LDLDIRECT" in the last 72 hours. Thyroid Function Tests: No results for input(s): "TSH", "T4TOTAL", "FREET4", "T3FREE", "THYROIDAB" in the last 72 hours. Anemia Panel: No results for input(s): "VITAMINB12", "FOLATE", "FERRITIN", "TIBC", "IRON", "RETICCTPCT" in the last 72 hours. Urine analysis:    Component Value Date/Time   COLORURINE YELLOW (A) 06/03/2021 2055   APPEARANCEUR CLOUDY (A) 06/03/2021 2055   APPEARANCEUR Clear 09/09/2014 1708   LABSPEC 1.012 06/03/2021 2055   LABSPEC 1.011 09/09/2014 1708   PHURINE 6.0 06/03/2021 2055   GLUCOSEU 50 (A) 06/03/2021 2055   GLUCOSEU Negative 09/09/2014 1708   HGBUR SMALL (A) 06/03/2021 2055   BILIRUBINUR NEGATIVE 06/03/2021 2055   BILIRUBINUR Negative 09/09/2014 1708   KETONESUR 5 (A) 06/03/2021 2055   PROTEINUR 100 (A) 06/03/2021 2055   NITRITE NEGATIVE 06/03/2021 2055   LEUKOCYTESUR NEGATIVE 06/03/2021 2055   LEUKOCYTESUR Negative 09/09/2014 1708   Sepsis Labs: @LABRCNTIP (procalcitonin:4,lacticidven:4) )No results found for this or any previous visit (from the past 240 hour(s)).   Radiological Exams on Admission: No results found.    Assessment/Plan Principal Problem:   GI bleeding Active Problems:   ABLA (acute blood loss anemia)   Iron deficiency anemia   CAD (coronary artery disease)   Chronic  diastolic CHF (congestive heart failure) (HCC)   Stroke (HCC)   Hyperlipidemia   Essential hypertension   Cognitive impairment   Tobacco abuse   Alcohol abuse   Assessment and Plan:   GI bleeding, ABLA (acute blood loss anemia) and iron deficiency anemia: Hgb dropped recent 8.4 to 7.1. Recent anemia panel showed iron deficiency with iron 15 and ferritin 6. Consulted Dr.  Vanga of IG --> repeat push enteroscopy tomorrow.  - will admitted to tele bed as inpatient - hold ASA - Give one dose of IV Ferric gluconate 250 mg per Dr. Allegra Lai - transfuse 1 units of blood now - IVF: will not give IV due to significant elevated BNP 973 - Start IV pantoprazole gtt - Zofran IV for nausea - Avoid NSAIDs and SQ heparin - Maintain IV access (2 large bore IVs if possible). - Monitor closely and follow q6h cbc, transfuse as necessary, if Hgb<7.0 - LaB: INR, PTT and type screen  CAD (coronary artery disease) -Hold ASA -Pravastatin  Chronic diastolic CHF (congestive heart failure) (HCC): 2D echo on 05/16/2023 showed EF of 60-65%.  Patient has 1+ leg edema and elevated BNP 973, at high risk of CHF exacerbation -Given 20 mg of Lasix IV now --Start 20 mg Lasix twice daily diet 20:00   Stroke (HCC) -hold ASA -Pravastatin  Hyperlipidemia -Pravastatin  Essential hypertension -IV hydralazine as needed -Metoprolol, amlodipine  Cognitive impairment -Donepezil  Tobacco abuse -Nicotine patch  Alcohol abuse -CIWA protocol      DVT ppx: SCD  Code Status: Full code   Family Communication: not done, no family member is at bed side.     Disposition Plan:  Anticipate discharge back to previous environment  Consults called:  Dr. Allegra Lai of GI  Admission status and Level of care: Telemetry Medical:    as inpt      Dispo: The patient is from: Home              Anticipated d/c is to: Home              Anticipated d/c date is: 2 days              Patient currently is not medically stable to  d/c.    Severity of Illness:  The appropriate patient status for this patient is INPATIENT. Inpatient status is judged to be reasonable and necessary in order to provide the required intensity of service to ensure the patient's safety. The patient's presenting symptoms, physical exam findings, and initial radiographic and laboratory data in the context of their chronic comorbidities is felt to place them at high risk for further clinical deterioration. Furthermore, it is not anticipated that the patient will be medically stable for discharge from the hospital within 2 midnights of admission.   * I certify that at the point of admission it is my clinical judgment that the patient will require inpatient hospital care spanning beyond 2 midnights from the point of admission due to high intensity of service, high risk for further deterioration and high frequency of surveillance required.*       Date of Service 05/31/2023    Lorretta Harp [facility]   If 7PM-7AM, please contact night-coverage [email or web address] 05/31/2023, 5:18 PM

## 2023-05-31 NOTE — ED Provider Notes (Signed)
[facility] Provider Note    Event Date/Time   First MD Initiated Contact with Patient 05/31/23 1359     (approximate)   History   Rectal Bleeding   HPI  [name] is a 62 y.o. male past medical history significant for alcohol abuse, COPD, hypertension, CAD with prior CABG, prior CVA, recent hospitalization for GI bleed that required cautery, who presents to the emergency department with blood in his stool.  States that he had been doing fine and not having any blood in his stool until the past couple of days.  Endorses melanotic stool that is black and tarry and looks similar to when he had needed to be hospitalized for GI bleed.  Does endorse ongoing alcohol use and states he had a "couple of sips today".  No history of variceal bleed.  Does endorse some shortness of breath with exertional dyspnea.  Denies any significant abdominal pain at this time.  Not on anticoagulation.  History of alcohol withdrawal     Physical Exam   Triage Vital Signs: ED Triage Vitals  Enc Vitals Group     BP 05/31/23 1130 124/70     Pulse Rate 05/31/23 1130 67     Resp 05/31/23 1130 18     Temp 05/31/23 1130 98.2 F (36.8 C)     Temp Source 05/31/23 1130 Oral     SpO2 05/31/23 1130 100 %     Weight 05/31/23 1127 207 lb 14.3 oz (94.3 kg)     Height 05/31/23 1127 6' (1.829 m)     Head Circumference --      Peak Flow --      Pain Score 05/31/23 1126 9     Pain Loc --      Pain Edu? --      Excl. in GC? --     Most recent vital signs: Vitals:   05/31/23 1130  BP: 124/70  Pulse: 67  Resp: 18  Temp: 98.2 F (36.8 C)  SpO2: 100%    Physical Exam Constitutional:      Appearance: He is well-developed. He is ill-appearing.  HENT:     Head: Atraumatic.  Eyes:     Conjunctiva/sclera: Conjunctivae normal.  Cardiovascular:     Rate and Rhythm: Regular rhythm.  Pulmonary:     Effort: No respiratory distress.  Abdominal:     Tenderness: There is abdominal  tenderness (Epigastric).  Musculoskeletal:        General: Normal range of motion.     Cervical back: Normal range of motion.  Skin:    General: Skin is warm.  Neurological:     Mental Status: He is alert. Mental status is at baseline.     IMPRESSION / MDM / ASSESSMENT AND PLAN / ED COURSE  I reviewed the triage vital signs and the nursing notes.  On chart review patient had a recent hospitalization was discharged at the end of May following an upper endoscopy with a GI bleed that required cautery.  Patient has not yet received any blood transfusions.  Does have a history of alcohol abuse but no history of varices   Differential diagnosis including upper GI bleed, lower GI bleed  EKG  No tachycardic or bradycardic dysrhythmias while on cardiac telemetry.  LABS (all labs ordered are listed, but only abnormal results are displayed) Labs interpreted as -    Labs Reviewed  COMPREHENSIVE METABOLIC PANEL - Abnormal; Notable for the following components:  Result Value   Glucose, Bld 115 (*)    Total Bilirubin 1.3 (*)    All other components within normal limits  CBC - Abnormal; Notable for the following components:   WBC 10.6 (*)    RBC 3.19 (*)    Hemoglobin 7.1 (*)    HCT 24.9 (*)    MCV 78.1 (*)    MCH 22.3 (*)    MCHC 28.5 (*)    RDW 17.2 (*)    Platelets 401 (*)    All other components within normal limits  LIPASE, BLOOD  ETHANOL  CBC  CBC  CBC  BRAIN NATRIURETIC PEPTIDE  PROTIME-INR  APTT  POC OCCULT BLOOD, ED  TYPE AND SCREEN  PREPARE RBC (CROSSMATCH)     MDM    Patient found to have significant anemia of 7.1, this is downtrending when compared to prior.  Given his exertional dyspnea and symptoms of a upper GI bleed patient was typed and crossed for 1 unit PRBCs.  Given IV Protonix.  Alcohol level added on.  No signs of alcohol withdrawal at this time.  Consulted hospitalist for admission for concern for upper GI bleed.   PROCEDURES:  Critical  Care performed: yes .Critical Care  Performed by: Corena Herter, MD Authorized by: Corena Herter, MD   Critical care provider statement:    Critical care time (minutes):  30   Critical care time was exclusive of:  Separately billable procedures and treating other patients   Critical care was necessary to treat or prevent imminent or life-threatening deterioration of the following conditions:  Circulatory failure   Critical care was time spent personally by me on the following activities:  Development of treatment plan with patient or surrogate, discussions with consultants, evaluation of patient's response to treatment, examination of patient, ordering and review of laboratory studies, ordering and review of radiographic studies, ordering and performing treatments and interventions, pulse oximetry, re-evaluation of patient's condition and review of old charts   Patient's presentation is most consistent with acute presentation with potential threat to life or bodily function.   MEDICATIONS ORDERED IN ED: Medications  0.9 %  sodium chloride infusion (Manually program via Guardrails IV Fluids) (has no administration in time range)  pantoprozole (PROTONIX) 80 mg /NS 100 mL infusion (has no administration in time range)  pantoprazole (PROTONIX) injection 40 mg (has no administration in time range)  0.9 %  sodium chloride infusion (has no administration in time range)  nicotine (NICODERM CQ - dosed in mg/24 hours) patch 21 mg (has no administration in time range)  LORazepam (ATIVAN) tablet 1-4 mg (has no administration in time range)    Or  LORazepam (ATIVAN) injection 1-4 mg (has no administration in time range)  thiamine (VITAMIN B1) tablet 100 mg (has no administration in time range)    Or  thiamine (VITAMIN B1) injection 100 mg (has no administration in time range)  folic acid (FOLVITE) tablet 1 mg (has no administration in time range)  multivitamin with minerals tablet 1 tablet (has no  administration in time range)  LORazepam (ATIVAN) injection 0-4 mg (has no administration in time range)    Followed by  LORazepam (ATIVAN) injection 0-4 mg (has no administration in time range)  albuterol (PROVENTIL) (2.5 MG/3ML) 0.083% nebulizer solution 3 mL (has no administration in time range)  dextromethorphan-guaiFENesin (MUCINEX DM) 30-600 MG per 12 hr tablet 1 tablet (has no administration in time range)  ondansetron (ZOFRAN) injection 4 mg (has no administration in time range)  acetaminophen (TYLENOL) tablet 650 mg (has no administration in time range)  hydrALAZINE (APRESOLINE) injection 5 mg (has no administration in time range)    FINAL CLINICAL IMPRESSION(S) / ED DIAGNOSES   Final diagnoses:  Rectal bleeding  Anemia, unspecified type     Rx / DC Orders   ED Discharge Orders     None        Note:  This document was prepared using Dragon voice recognition software and may include unintentional dictation errors.   Corena Herter, MD 05/31/23 1428

## 2023-06-01 ENCOUNTER — Inpatient Hospital Stay: Payer: 59 | Admitting: Anesthesiology

## 2023-06-01 ENCOUNTER — Encounter
Admission: EM | Disposition: A | Discharge status: Home / Self Care | Payer: Self-pay | Source: Home / Self Care | Attending: Internal Medicine

## 2023-06-01 ENCOUNTER — Encounter: Payer: Self-pay | Admitting: Internal Medicine

## 2023-06-01 DIAGNOSIS — K625 Hemorrhage of anus and rectum: Secondary | ICD-10-CM

## 2023-06-01 DIAGNOSIS — E782 Mixed hyperlipidemia: Secondary | ICD-10-CM

## 2023-06-01 DIAGNOSIS — F17220 Nicotine dependence, chewing tobacco, uncomplicated: Secondary | ICD-10-CM

## 2023-06-01 DIAGNOSIS — K921 Melena: Principal | ICD-10-CM

## 2023-06-01 HISTORY — PX: GIVENS CAPSULE STUDY: SHX5432

## 2023-06-01 HISTORY — PX: ENTEROSCOPY: SHX5533

## 2023-06-01 LAB — CBC
HCT: 26.1 % — ABNORMAL LOW (ref 39.0–52.0)
HCT: 28.5 % — ABNORMAL LOW (ref 39.0–52.0)
HCT: 28.7 % — ABNORMAL LOW (ref 39.0–52.0)
Hemoglobin: 7.7 g/dL — ABNORMAL LOW (ref 13.0–17.0)
Hemoglobin: 8.7 g/dL — ABNORMAL LOW (ref 13.0–17.0)
Hemoglobin: 8.7 g/dL — ABNORMAL LOW (ref 13.0–17.0)
MCH: 22.8 pg — ABNORMAL LOW (ref 26.0–34.0)
MCH: 23.9 pg — ABNORMAL LOW (ref 26.0–34.0)
MCH: 24 pg — ABNORMAL LOW (ref 26.0–34.0)
MCHC: 29.5 g/dL — ABNORMAL LOW (ref 30.0–36.0)
MCHC: 30.5 g/dL (ref 30.0–36.0)
MCHC: 30.3 g/dL (ref 30.0–36.0)
MCV: 77.2 fL — ABNORMAL LOW (ref 80.0–100.0)
MCV: 78.3 fL — ABNORMAL LOW (ref 80.0–100.0)
MCV: 79.1 fL — ABNORMAL LOW (ref 80.0–100.0)
Platelets: 383 10*3/uL (ref 150–400)
Platelets: 362 10*3/uL (ref 150–400)
Platelets: 365 10*3/uL (ref 150–400)
RBC: 3.38 MIL/uL — ABNORMAL LOW (ref 4.22–5.81)
RBC: 3.64 MIL/uL — ABNORMAL LOW (ref 4.22–5.81)
RBC: 3.63 MIL/uL — ABNORMAL LOW (ref 4.22–5.81)
RDW: 17 % — ABNORMAL HIGH (ref 11.5–15.5)
RDW: 17.2 % — ABNORMAL HIGH (ref 11.5–15.5)
RDW: 17.3 % — ABNORMAL HIGH (ref 11.5–15.5)
WBC: 13.8 10*3/uL — ABNORMAL HIGH (ref 4.0–10.5)
WBC: 11.5 10*3/uL — ABNORMAL HIGH (ref 4.0–10.5)
WBC: 14.2 10*3/uL — ABNORMAL HIGH (ref 4.0–10.5)
nRBC: 0 % (ref 0.0–0.2)
nRBC: 0 % (ref 0.0–0.2)
nRBC: 0 % (ref 0.0–0.2)

## 2023-06-01 LAB — PREPARE RBC (CROSSMATCH)

## 2023-06-01 LAB — TYPE AND SCREEN: Unit division: 0

## 2023-06-01 LAB — GLUCOSE, CAPILLARY: Glucose-Capillary: 92 mg/dL (ref 70–99)

## 2023-06-01 LAB — BASIC METABOLIC PANEL
Anion gap: 7 (ref 5–15)
BUN: 9 mg/dL (ref 8–23)
CO2: 26 mmol/L (ref 22–32)
Calcium: 8.3 mg/dL — ABNORMAL LOW (ref 8.9–10.3)
Chloride: 100 mmol/L (ref 98–111)
Creatinine, Ser: 0.95 mg/dL (ref 0.61–1.24)
GFR, Estimated: >60 mL/min (ref >60)
Glucose, Bld: 103 mg/dL — ABNORMAL HIGH (ref 70–99)
Potassium: 4.2 mmol/L (ref 3.5–5.1)
Sodium: 133 mmol/L — ABNORMAL LOW (ref 135–145)

## 2023-06-01 LAB — BPAM RBC
Blood Product Expiration Date: 202407142359
Unit Type and Rh: 6200

## 2023-06-01 SURGERY — ENTEROSCOPY
Anesthesia: General

## 2023-06-01 MED ORDER — PROPOFOL 10 MG/ML IV BOLUS
INTRAVENOUS | Status: DC | PRN
  Administered 2023-06-01: 70 mg via INTRAVENOUS

## 2023-06-01 MED ORDER — STERILE WATER FOR IRRIGATION IR SOLN
Status: DC | PRN
  Administered 2023-06-01: 60 mL

## 2023-06-01 MED ORDER — PROPOFOL 500 MG/50ML IV EMUL
INTRAVENOUS | Status: DC | PRN
  Administered 2023-06-01: 120 ug/kg/min via INTRAVENOUS

## 2023-06-01 MED ORDER — ENSURE ENLIVE PO LIQD
237.0000 mL | Freq: Two times a day (BID) | ORAL | Status: DC
  Administered 2023-06-02 (×2): 237 mL via ORAL

## 2023-06-01 MED ORDER — GLYCOPYRROLATE 0.2 MG/ML IJ SOLN
INTRAMUSCULAR | Status: DC | PRN
  Administered 2023-06-01: .2 mg via INTRAVENOUS

## 2023-06-01 MED ORDER — SODIUM CHLORIDE 0.9% IV SOLUTION: Freq: Once | INTRAVENOUS | Status: DC

## 2023-06-01 MED ORDER — SODIUM CHLORIDE 0.9 % IV SOLN: INTRAVENOUS | Status: DC

## 2023-06-01 MED ORDER — LIDOCAINE HCL (CARDIAC) PF 100 MG/5ML IV SOSY
PREFILLED_SYRINGE | INTRAVENOUS | Status: DC | PRN
  Administered 2023-06-01: 30 mg via INTRAVENOUS

## 2023-06-01 NOTE — Anesthesia Preprocedure Evaluation (Signed)
Anesthesia Evaluation  Patient identified by MRN, date of birth, ID band Patient awake    Reviewed: Allergy & Precautions, NPO status , Patient's Chart, lab work & pertinent test results  History of Anesthesia Complications Negative for: history of anesthetic complications  Airway Mallampati: III  TM Distance: <3 FB Neck ROM: full    Dental  (+) Chipped, Poor Dentition, Missing   Pulmonary COPD, former smoker   Pulmonary exam normal        Cardiovascular Exercise Tolerance: Good hypertension, + CAD, + Past MI, + CABG, + Peripheral Vascular Disease and +CHF  Normal cardiovascular exam     Neuro/Psych  Headaches CVA  negative psych ROS   GI/Hepatic Neg liver ROS,GERD  Controlled,,  Endo/Other  negative endocrine ROS    Renal/GU Renal disease  negative genitourinary   Musculoskeletal   Abdominal   Peds  Hematology negative hematology ROS (+)   Anesthesia Other Findings Past Medical History: No date: COPD (chronic obstructive pulmonary disease) (HCC) No date: CVA (cerebral infarction) No date: Headache     Comment:  mild, since stroke 2012 No date: Hypertension 05/09/2012: MI (myocardial infarction) (HCC) No date: Reading difficulty     Comment:  pt reports he reads at "about a second grade level" 2012: Stroke [facility])     Comment:  numbness to left hand  No date: Wears dentures     Comment:  full upper and lower  Past Surgical History: No date: CAROTID ENDARTERECTOMY; Left     Comment:  2012 or 2013 07/26/2016: CHOLECYSTECTOMY; N/A     Comment:  Procedure: LAPAROSCOPIC CHOLECYSTECTOMY;  Surgeon:               Lattie Haw, MD;  Location: [facility] ORS;  Service:               General;  Laterality: N/A; 02/09/2022: COLONOSCOPY WITH PROPOFOL; N/A     Comment:  Procedure: COLONOSCOPY WITH PROPOFOL;  Surgeon: Midge Minium, MD;  Location: [facility] ENDOSCOPY;  Service:               Endoscopy;  Laterality:  N/A; 05/10/2012: CORONARY ARTERY BYPASS GRAFT     Comment:  3 vessel 05/16/2023: ENTEROSCOPY; N/A     Comment:  Procedure: ENTEROSCOPY;  Surgeon: Wyline Mood, MD;                Location: [facility] ENDOSCOPY;  Service: Gastroenterology;                Laterality: N/A; 05/13/2023: ESOPHAGOGASTRODUODENOSCOPY     Comment:  Procedure: ESOPHAGOGASTRODUODENOSCOPY (EGD);  Surgeon:               Midge Minium, MD;  Location: [facility] ENDOSCOPY;  Service:               Endoscopy;; 10/28/2022: ESOPHAGOGASTRODUODENOSCOPY (EGD) WITH PROPOFOL; N/A     Comment:  Procedure: ESOPHAGOGASTRODUODENOSCOPY (EGD) WITH               PROPOFOL;  Surgeon: Jaynie Collins, DO;  Location:              [facility] ENDOSCOPY;  Service: Gastroenterology;  Laterality:               N/A; 05/13/2023: GIVENS CAPSULE STUDY     Comment:  Procedure: GIVENS CAPSULE STUDY;  Surgeon: Midge Minium,  MD;  Location: [facility] ENDOSCOPY;  Service: Endoscopy;;  BMI    Body Mass Index: 29.72 kg/m      Reproductive/Obstetrics negative OB ROS                             Anesthesia Physical Anesthesia Plan  ASA: 3  Anesthesia Plan: General   Post-op Pain Management:    Induction: Intravenous  PONV Risk Score and Plan: Propofol infusion and TIVA  Airway Management Planned: Natural Airway and Nasal Cannula  Additional Equipment:   Intra-op Plan:   Post-operative Plan:   Informed Consent: I have reviewed the patients History and Physical, chart, labs and discussed the procedure including the risks, benefits and alternatives for the proposed anesthesia with the patient or authorized representative who has indicated his/her understanding and acceptance.     Dental Advisory Given  Plan Discussed with: Anesthesiologist, CRNA and Surgeon  Anesthesia Plan Comments: (Patient consented for risks of anesthesia including but not limited to:  - adverse reactions to medications - risk of airway placement  if required - damage to eyes, teeth, lips or other oral mucosa - nerve damage due to positioning  - sore throat or hoarseness - Damage to heart, brain, nerves, lungs, other parts of body or loss of life  Patient voiced understanding.)       Anesthesia Quick Evaluation

## 2023-06-01 NOTE — Progress Notes (Signed)
Report received from Shift RN. 1 Unit RBC transfusing. No sign of acute distress noted at this time.

## 2023-06-01 NOTE — Anesthesia Postprocedure Evaluation (Signed)
Anesthesia Post Note  Patient: [name]  Procedure(s) Performed: ENTEROSCOPY GIVENS CAPSULE STUDY  Patient location during evaluation: Endoscopy Anesthesia Type: General Level of consciousness: awake and alert Pain management: pain level controlled Vital Signs Assessment: post-procedure vital signs reviewed and stable Respiratory status: spontaneous breathing, nonlabored ventilation, respiratory function stable and patient connected to nasal cannula oxygen Cardiovascular status: blood pressure returned to baseline and stable Postop Assessment: no apparent nausea or vomiting Anesthetic complications: no   No notable events documented.   Last Vitals:  Vitals:   06/01/23 1508 06/01/23 1516  BP: (!) 88/59 99/77  Pulse: 61 89  Resp: (!) 26 20  Temp:    SpO2: 94% 93%    Last Pain:  Vitals:   06/01/23 1516  TempSrc:   PainSc: 0-No pain                 Cleda Mccreedy Altan Kraai

## 2023-06-01 NOTE — Transfer of Care (Signed)
Immediate Anesthesia Transfer of Care Note  Patient: [name]  Procedure(s) Performed: ENTEROSCOPY GIVENS CAPSULE STUDY  Patient Location: Endoscopy Unit  Anesthesia Type:General  Level of Consciousness: drowsy  Airway & Oxygen Therapy: Patient Spontanous Breathing  Post-op Assessment: Report given to RN and Post -op Vital signs reviewed and stable  Post vital signs: Reviewed  Last Vitals:  Vitals Value Taken Time  BP 88/59 06/01/23 1508  Temp 35.9 C 06/01/23 1505  Pulse 88 06/01/23 1508  Resp 26 06/01/23 1508  SpO2 96 % 06/01/23 1508  Vitals shown include unvalidated device data.  Last Pain:  Vitals:   06/01/23 1505  TempSrc: Temporal  PainSc: Asleep         Complications: No notable events documented.

## 2023-06-01 NOTE — Progress Notes (Signed)
  Progress Note   Patient: [name] NWG:[id number] DOB: 02/07/1961 DOA: 05/31/2023     1 DOS: the patient was seen and examined on 06/01/2023   Brief hospital course: [name] is a 62 y.o. male with medical history significant of GIB, alcohol abuse, COPD, hypertension, CAD with prior CABG, prior CVA, HLD, stroke, dementia, dCHF, who presents with dark stool. 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  Patient is admitted to hospitalist with GI consultation.  Assessment and Plan: Acute on chronic GI bleed. Acute blood loss anemia. Iron deficiency anemia. Patient got 1 unit of packed RBC. Got IV iron therapy yesterday. GI evaluation appreciated. Patient n.p.o. for EGD procedure today.  CAD (coronary artery disease)/ Stroke/ HLD. Continue to hold ASA due to GI bleed. Continue Pravastatin   Chronic diastolic CHF (congestive heart failure) (HCC): 2D echo on 05/16/2023 showed EF of 60-65%.  Patient has 1+ leg edema and elevated BNP 973, at high risk of CHF exacerbation Continue 20 mg Lasix twice daily.   Essential hypertension Hold Metoprolol, amlodipine due to low BP   Cognitive impairment Continue Donepezil   Tobacco abuse Nicotine patch. Counseled smoking cessation.   Alcohol abuse- Mild confusion noted. Watch for DTs. CIWA protocol Thiamine, folate, mvt.     Subjective: Patient is seen and examined today morning. He seem to sleeping, arousable, asks for his wallet. RN noted he is confused. No withdrawal symptoms.  Physical Exam: Vitals:   06/01/23 1342 06/01/23 1505 06/01/23 1508 06/01/23 1516  BP: 127/71 (!) 81/45 (!) 88/59 99/77  Pulse: 86 67 61 89  Resp: 18 14 (!) 26 20  Temp: 97.7 F (36.5 C) (!) 96.7 F (35.9 C)    TempSrc: Temporal Temporal     SpO2: 99% 97% 94% 93%  Weight:      Height:       General - Elderly looking Caucasian male, no apparent distress HEENT - PERRLA, EOMI, atraumatic head, non tender sinuses. Lung -decreased breath sounds, diffuse rhonchi Heart - S1, S2 heard, no murmurs, rubs, 1+ pedal edema Neuro - Alert, awake confused, non focal exam. Skin - Warm and dry. Data Reviewed:  CBC, H&H monitoring, BMP, blood sugars  Family Communication: No family contact.  Will discuss with family regarding current care plan once he is more alert and awake.  Disposition: Status is: Inpatient Remains inpatient appropriate because: Patient will need GI workup for recurrent bleeding  Planned Discharge Destination: Barriers to discharge: lives in a car, need safe dispo plan/ Shelter    Time spent: 44 minutes  Author: Marcelino Duster, MD 06/01/2023 3:53 PM  For on call review [email or web address].

## 2023-06-01 NOTE — Op Note (Signed)
[facility] Gastroenterology Patient Name: [name] Procedure Date: 06/01/2023 2:42 PM MRN: [id number] Account #: [id number] Date of Birth: 1961-08-24 Admit Type: Outpatient Age: 62 Room: [facility] ENDO ROOM 2 Gender: Male Note Status: Finalized Instrument Name: Peds Colonoscope [id number] Procedure:             Small bowel enteroscopy Indications:           Iron deficiency anemia secondary to chronic blood                         loss, Melena, Arteriovenous malformation in the small                         intestine Providers:             Toney Reil MD, MD Medicines:             General Anesthesia Complications:         No immediate complications. Estimated blood loss: None. Procedure:             Pre-Anesthesia Assessment:                        - Prior to the procedure, a History and Physical was                         performed, and patient medications and allergies were                         reviewed. The patient is competent. The risks and                         benefits of the procedure and the sedation options and                         risks were discussed with the patient. All questions                         were answered and informed consent was obtained.                         Patient identification and proposed procedure were                         verified by the physician, the nurse, the                         anesthesiologist, the anesthetist and the technician                         in the pre-procedure area in the procedure room in the                         endoscopy suite. Mental Status Examination: alert and                         oriented. Airway Examination: normal oropharyngeal                         airway and  neck mobility. Respiratory Examination:                         clear to auscultation. CV Examination: normal.                         Prophylactic Antibiotics: The patient does not require                          prophylactic antibiotics. Prior Anticoagulants: The                         patient has taken no anticoagulant or antiplatelet                         agents. ASA Grade Assessment: III - A patient with                         severe systemic disease. After reviewing the risks and                         benefits, the patient was deemed in satisfactory                         condition to undergo the procedure. The anesthesia                         plan was to use general anesthesia. Immediately prior                         to administration of medications, the patient was                         re-assessed for adequacy to receive sedatives. The                         heart rate, respiratory rate, oxygen saturations,                         blood pressure, adequacy of pulmonary ventilation, and                         response to care were monitored throughout the                         procedure. The physical status of the patient was                         re-assessed after the procedure.                        After obtaining informed consent, the endoscope was                         passed under direct vision. Throughout the procedure,                         the patient's blood pressure, pulse, and oxygen  saturations were monitored continuously. The                         Colonoscope was introduced through the mouth and                         advanced to the mid-jejunum. The small bowel                         enteroscopy was accomplished without difficulty. The                         patient tolerated the procedure well. Findings:      There was no evidence of significant pathology in the proximal jejunum       and in the mid-jejunum. Area was tattooed with an injection of Spot       (carbon black).      The esophagus was normal.      The stomach was normal.      There was no evidence of significant pathology in the entire examined        duodenum. Impression:            - The examined portion of the jejunum was normal.                         Tattooed.                        - Normal esophagus.                        - Normal stomach.                        - Normal examined duodenum.                        - No specimens collected. Recommendation:        - Return patient to hospital ward for ongoing care.                        - To visualize the small bowel, perform video capsule                         endoscopy today.                        - Advance diet as tolerated today. Procedure Code(s):     --- Professional ---                        [phone], Small intestinal endoscopy, enteroscopy beyond                         second portion of duodenum, not including ileum;                         diagnostic, including collection of specimen(s) by                         brushing or washing, when performed (separate  procedure)                        [phone], Unlisted procedure, small intestine Diagnosis Code(s):     --- Professional ---                        D50.0, Iron deficiency anemia secondary to blood loss                         (chronic)                        K92.1, Melena (includes Hematochezia)                        K31.819, Angiodysplasia of stomach and duodenum                         without bleeding CPT copyright 2022 American Medical Association. All rights reserved. The codes documented in this report are preliminary and upon coder review may  be revised to meet current compliance requirements. Dr. Libby Maw Toney Reil MD, MD 06/01/2023 3:02:57 PM This report has been signed electronically. Number of Addenda: 0 Note Initiated On: 06/01/2023 2:42 PM Estimated Blood Loss:  Estimated blood loss: none.      [facility]

## 2023-06-02 DIAGNOSIS — I110 Hypertensive heart disease with heart failure: Secondary | ICD-10-CM

## 2023-06-02 DIAGNOSIS — K5520 Angiodysplasia of colon without hemorrhage: Secondary | ICD-10-CM

## 2023-06-02 LAB — BASIC METABOLIC PANEL
Anion gap: 11 (ref 5–15)
BUN: 10 mg/dL (ref 8–23)
CO2: 26 mmol/L (ref 22–32)
Calcium: 8.6 mg/dL — ABNORMAL LOW (ref 8.9–10.3)
Chloride: 97 mmol/L — ABNORMAL LOW (ref 98–111)
Creatinine, Ser: 0.98 mg/dL (ref 0.61–1.24)
GFR, Estimated: >60 mL/min (ref >60)
Glucose, Bld: 119 mg/dL — ABNORMAL HIGH (ref 70–99)
Potassium: 3.5 mmol/L (ref 3.5–5.1)
Sodium: 134 mmol/L — ABNORMAL LOW (ref 135–145)

## 2023-06-02 LAB — TYPE AND SCREEN
ABO/RH(D): A POS
Antibody Screen: NEGATIVE
Unit division: 0

## 2023-06-02 LAB — BPAM RBC
Blood Product Expiration Date: 202406192359
ISSUE DATE / TIME: 202406140618

## 2023-06-02 LAB — GLUCOSE, CAPILLARY: Glucose-Capillary: 116 mg/dL — ABNORMAL HIGH (ref 70–99)

## 2023-06-02 LAB — CBC
HCT: 27.6 % — ABNORMAL LOW (ref 39.0–52.0)
Hemoglobin: 8.3 g/dL — ABNORMAL LOW (ref 13.0–17.0)
MCH: 23.8 pg — ABNORMAL LOW (ref 26.0–34.0)
MCHC: 30.1 g/dL (ref 30.0–36.0)
MCV: 79.1 fL — ABNORMAL LOW (ref 80.0–100.0)
Platelets: 370 10*3/uL (ref 150–400)
RBC: 3.49 MIL/uL — ABNORMAL LOW (ref 4.22–5.81)
RDW: 18.1 % — ABNORMAL HIGH (ref 11.5–15.5)
WBC: 15.9 10*3/uL — ABNORMAL HIGH (ref 4.0–10.5)
nRBC: 0 % (ref 0.0–0.2)

## 2023-06-02 MED ORDER — VITAMIN B-1 100 MG PO TABS: 100.0000 mg | ORAL_TABLET | Freq: Every day | ORAL | 3 refills | Status: AC

## 2023-06-02 NOTE — Progress Notes (Signed)
[name] to be D/C'd Home per MD order.  Discussed with the patient and all questions fully answered.  VSS, Skin clean, dry and intact without evidence of skin break down, no evidence of skin tears noted. IV catheters discontinued intact. Site without signs and symptoms of complications. Dressing and pressure applied.  An After Visit Summary was printed and given to the patient. Patient prescription sent to pharmacy. Patient signed taxi cab wavier form.   D/c education completed with patient/family including follow up instructions, medication list, d/c activities limitations if indicated, with other d/c instructions as indicated by MD - patient able to verbalize understanding, all questions fully answered.   Patient instructed to return to ED, call 911, or call MD for any changes in condition.   Patient escorted via WC, and D/C home via taxi cab.  [name] 06/02/2023 6:11 PM

## 2023-06-02 NOTE — TOC Progression Note (Addendum)
Transition of Care [facility]) - Progression Note    Patient Details  Name: [name] MRN: [id number] Date of Birth: Mar 28, 1961  Transition of [facility] CM/SW Contact  Liliana Cline, LCSW Phone Number: 06/02/2023, 5:23 PM  Clinical Narrative:    Notified by RN that [name] is not answering the phone. CSW attempted calls x 3 to [name]. No answer and VM is full. Per VM message, they only transport until 630 pm.  Called [facility]. They are able to transport patient, they estimate about $78 since they are coming from [place]. [facility] arranged for pick up and to call RN when they arrive at [facility]. ETA 35 minutes. RN aware. RN to update cab voucher to reflect [place] instead of [name].      Barriers to Discharge: Barriers Resolved  Expected Discharge Plan and Services       Living arrangements for the past 2 months: Homeless Expected Discharge Date: 06/02/23                                     Social Determinants of Health (SDOH) Interventions SDOH Screenings   Food Insecurity: Food Insecurity Present (05/15/2023)  Housing: High Risk (05/15/2023)  Transportation Needs: No Transportation Needs (06/02/2023)  Utilities: Not At Risk (05/15/2023)  Tobacco Use: High Risk (06/01/2023)    Readmission Risk Interventions    10/27/2022   11:28 AM  Readmission Risk Prevention Plan  Transportation Screening Complete  PCP or Specialist Appt within 5-7 Days Complete  Home Care Screening Complete  Medication Review (RN CM) Complete

## 2023-06-02 NOTE — Discharge Instructions (Signed)
Rent/Utility/Housing  Agency Name: [facility] Address: [address] [place], [place] [address] Phone: [phone] Email: [email or web address] Website: [email or web address].org Service(s) Offered: Housing services, self-sufficiency, congregate meal  program, weatherization program, heating appliance  repair/replacement program, emergency food assistance,  housing counseling, home ownership program, wheels -towork program.  Agency Name: [facility] Address: [address], [place], [place] [address] Phone: [phone] (8a-4p) [phone] (8p- 10p) Email: [email or web address] Website: [email or web address] Service(s) Offered: A program for homeless and/or needy men that includes one-on-one counseling, life skills training and job rehabilitation.  Agency Name: [facility] Address: [address], [place], [place] [address] Phone: [phone] Website: www.[facility].org Service(s) Offered: Assistance to needy in emergency with utility bills, heating  fuel, and prescriptions. Shelter for homeless 7pm-7am. April 12, 2017 15  Agency Name: [facility] Address: [address], [place], [place] [address] Phone: [phone] Contact Person: Wayne Dawson Email: [email or web address] Website: www.[facility]nc.org Service(s) Offered: Helps individuals with developmental disabilities move  from housing that is more restrictive to homes where they  can achieve greater independence and have more  opportunities.  Agency Name: [facility] Address: [address], [place], [place] [address] Phone: [phone] Email: [email or web address] Website: www.[email or web address].org Service(s) Offered: Provides affordable housing for low-income families,  elderly, and disabled individuals. Offer a wide range of  programs and services, from financial planning  to afterschool and summer programs.  Agency Name: [facility] Address: [address] [place], [place], [place] [address] Phone: [phone] Service(s) Offered: Child support services; child welfare services; food stamps;  Medicaid; work first family assistance; and aid with fuel,  rent, food and medicine.  Agency Name: [facility] Address: [facility] [place], [place]  [address] Phone: [phone] Website: www.familyabuseservices.org Service(s) Offered: 24 hour Crisis Line: 226-5985; 24 hour Emergency Shelter;  Transitional Housing; Support Groups; Court Advocacy;  Community Education; Hispanic Outreach: [phone];  Visitation Center: [phone]. April 12, 2017 16  Agency Name: [facility] Address: [address] [place], [place] [address] Phone: [phone] Service(s) Offered: CAP Services; Home and Community Supports; Individual  or Group Supports; Respite Care Non-Institutional Nursing;  Residential Supports; Respite Care and Personal Care  Services; Transportation; Family and Friends Night;  Recreational Activities; Three Nutritious Meals/Snacks;  Consultation with Registered Dietician; Twenty-four hour  Registered Nurse Access; Daily and Community Living  Skills; Camp [employer]; Summer Camp for the Special  Population (During Summer Months) Bingo Night (Every  Wednesday Night); Special Populations Dance Night  (Every Tuesday Night); Professional Hair Care Services.  Agency Name: [facility] Address: [address], [place], [place] [address] Phone: [phone] Contact Person: Gloria McCauley Website: [email or web address] Service(s) Offered: Residential treatment facility for women; food and  clothing, educational & employment development and  transportation to work; development of financial skills;  parenting and family  reunification; emotional and spiritual  support; transitional housing for program graduates.  Agency Name: [place] [facility] Address: [address], [place],  [address] Phone: [phone] Email: [email or web address] Website: [email or web address] Service(s) Offered: Public housing units for elderly, disabled, and low income  people; housing choice vouchers for income eligible  applicants; shelter plus care vouchers; and HOPWA  voucher program. April 12, 2017 17  Agency Name: [facility] Address: [address], ,  [address] Phone: [phone] Email: [email or web address] Website: www.[facility]itatalam[facility].org Service(s) Offered: Build houses for families in need of decent housing. Each    adult in the family must invest 200 hours of labor on  someone else's house, work with volunteers to build their  own house, attend classes on budgeting, home maintenance, yard care, and attend homeowner association  meetings.  Agency Name: [facility] Address: [address], [place], [place] [address] Phone: [phone] Website: www.rsli.org Service(s) Offered: Intermediate care facilities for mentally retarded,  Supervised Living in group homes for adults with  developmental disabilities, Supervised Living for people  who have dual diagnoses (MRMI), Independent Living,  Supported Living, respite and a variety of CAP services,  pre-vocational services, day supports, and Community  Support Services.  Agency Name: [employer] Phone: [phone] Website: www.[place]oreclosure[place]vention.gov Service(s) Offered: Zero-interest, deferred loans to homeowners struggling to  pay their mortgage. Call for more information  

## 2023-06-02 NOTE — TOC Initial Note (Addendum)
Transition of Care [facility]) - Initial/Assessment Note    Patient Details  Name: [name] MRN: [id number] Date of Birth: [birth year]  [facility]) CM/SW Contact:    Liliana Cline, LCSW Phone Number: 06/02/2023, 3:16 PM  Clinical Narrative:                 Patient to DC today per MD. Patient flagged for SDOH food and housing as well as SA resources. CSW spoke to patient. Patient states his Clinical research associate did reach out to him after his previous hospitalization, he had an appointment with her yesterday to get a copy of his SS Card but will need to call her to reschedule because he was at the hospital. Patient states he has her number and is working with her Laurena Bering) on finding housing. Offered shelter resources. Patient states he wants to stay in his car until he finds housing, declines going to a shelter.  Patient declines food resources, stating he has enough food. Patient also declines SA resources, stating he does not feel he needs them at this time.  Patient states his car is at [facility] on [address] as he was at an appointment with them and they sent him to the hospital. States he will need a ride back to his car and does not think he has the money for a cab. Rider Photographer to unit - requested RN have patient sign and place in hard chart. Cab voucher provided to go to [facility] [address].  Patient requested to talk to MD prior to DC, notified MD.  Patient requested pants and shirt, provided from clothing closet. Patient Assistance Acknowledgement Form signed by patient and provied to [facility] Assistant.     Barriers to Discharge: Barriers Resolved   Patient Goals and CMS Choice Patient states their goals for this hospitalization and ongoing recovery are:: wants to go back to his car          Expected Discharge Plan and Services       Living arrangements for the past 2 months: Homeless                                      Prior Living  Arrangements/Services Living arrangements for the past 2 months: Homeless Lives with:: Self Patient language and need for interpreter reviewed:: Yes Do you feel safe going back to the place where you live?: Yes      Need for Family Participation in Patient Care: Yes (Comment) Care giver support system in place?: Yes (comment)   Criminal Activity/Legal Involvement Pertinent to Current Situation/Hospitalization: No - Comment as needed  Activities of Daily Living      Permission Sought/Granted Permission sought to share information with : Facility Industrial/product designer granted to share information with : Yes, Verbal Permission Granted              Emotional Assessment       Orientation: : Oriented to Self, Oriented to Situation, Oriented to Place, Oriented to  Time Alcohol / Substance Use: Alcohol Use Psych Involvement: No (comment)  Admission diagnosis:  GI bleeding [K92.2] Rectal bleeding [K62.5] Anemia, unspecified type [D64.9] Patient Active Problem List   Diagnosis Date Noted   AVM (arteriovenous malformation) of small bowel, 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   Rectal bleeding 06/01/2023   GI bleeding 05/31/2023   CAD (coronary artery disease) 05/31/2023   Stroke (HCC)  05/31/2023   Iron deficiency anemia 05/31/2023   Hyponatremia 05/13/2023   Alcohol use disorder 05/13/2023   Chest pain 05/13/2023   Elevated troponin 05/13/2023   Chronic diastolic CHF (congestive heart failure) (HCC) 05/13/2023   ABLA (acute blood loss anemia) 05/13/2023   Iron deficiency anemia due to chronic blood loss 10/30/2022   Obesity hypoventilation syndrome (HCC) 10/29/2022   COPD with acute exacerbation (HCC) 10/28/2022   Melena 10/26/2022   Symptomatic anemia 10/25/2022   Acute diastolic CHF (congestive heart failure) (HCC) 10/25/2022   Essential hypertension 10/25/2022   GERD without esophagitis 10/25/2022   Dyslipidemia 10/25/2022   Special screening for malignant neoplasms, colon     Polyp of sigmoid colon    Orthostatic hypotension 06/04/2021   Stage 3a chronic kidney disease (HCC) 10/05/2020   Alcohol abuse    Syncope and collapse 05/31/2020   AKI (acute kidney injury) (HCC) 05/31/2020   Hypokalemia 05/31/2020   Memory loss or impairment 12/24/2018   Memory loss of unknown cause 10/14/2018   Sleeping difficulty 10/14/2018   Mild aortic valve stenosis 10/08/2018   Hyperlipidemia 03/27/2018   Cognitive impairment 03/12/2018   History of stroke s/p left carotid endarterectomy 03/12/2018   Hypertension 03/01/2018   Atherosclerosis of native arteries of extremity with intermittent claudication (HCC) 03/01/2018   Atherosclerotic peripheral vascular disease with intermittent claudication (HCC) 01/03/2018   Benign essential HTN 12/07/2017   Bilateral carotid artery stenosis 12/07/2017   SOBOE (shortness of breath on exertion) 12/07/2017   Acute cholecystitis 07/24/2016   Coronary artery disease involving coronary bypass graft of native heart without angina pectoris    Tobacco abuse    PCP:  Jodi Marble, NP Pharmacy:   [facility] - [place], [place] - [address] [place] [address] Phone: [phone] Fax: [phone]     Social Determinants of Health (SDOH) Social History: SDOH Screenings   Food Insecurity: Food Insecurity Present (05/15/2023)  Housing: High Risk (05/15/2023)  Transportation Needs: No Transportation Needs (06/02/2023)  Utilities: Not At Risk (05/15/2023)  Tobacco Use: High Risk (06/01/2023)   SDOH Interventions: Food Insecurity Interventions: Inpatient TOC Housing Interventions: Inpatient TOC Transportation Interventions: Taxi Voucher Given   Readmission Risk Interventions    10/27/2022   11:28 AM  Readmission Risk Prevention Plan  Transportation Screening Complete  PCP or Specialist Appt within 5-7 Days Complete  Home Care Screening Complete  Medication Review (RN CM) Complete

## 2023-06-02 NOTE — Discharge Summary (Signed)
Physician Discharge Summary   Patient: [name] MRN: [id number] DOB: [birth year]  Admit date:     05/31/2023  Discharge date: 06/02/23  Discharge Physician: Marcelino Duster   PCP: Jodi Marble, NP   Recommendations at discharge:   PCP in 1 week. Alcohol cessation advised. Norvasc, Toprol held due to low BP  Discharge Diagnoses: Principal Problem:   GI bleeding Active Problems:   Melena   ABLA (acute blood loss anemia)   Iron deficiency anemia   CAD (coronary artery disease)   Chronic diastolic CHF (congestive heart failure) (HCC)   Stroke (HCC)   Hyperlipidemia   Essential hypertension   Cognitive impairment   Tobacco abuse   Alcohol abuse   Rectal bleeding   AVM (arteriovenous malformation) of small bowel, acquired  Resolved Problems:   * No resolved hospital problems. *  Hospital Course: [name] is a 62 y.o. male with medical history significant of GIB, alcohol abuse, COPD, hypertension, CAD with prior CABG, prior CVA, HLD, stroke, dementia, dCHF, who presents with dark stool. Was admitted to [facility] on 5/27 secondary to symptomatic anemia and melena, had video capsule endoscopy which revealed active bleeding in the proximal small bowel, underwent push enteroscopy on 5/29 which revealed nonbleeding AVMs in the jejunum, treated with APC.  Patient is admitted to hospitalist with GI consultation.   During hospital stay, patient did receive a unit of blood, hemoglobin stable around 8. Was placed on CIWA protocol, IV hydration, NPO for GI intervention, PPI. He is seen by GI who advised push enteroscopy which is noted to be normal. A capsule endoscopy is suggested which is done yesterday. He does have distal small bowel AVM which are non bleeding. GI provider advised retrograde enteroscopy if he has rebleeding. Patient denies any active bleeding and is hemodynamically stable to be discharged home. Given his low BP, antihypertensive meds held. He is homeless, lives ina  car, case management evaluated him, resources provided for housing. Transportation provided. He is advised to stop metoprolol, norvasc, asked to follow up with PCP upon discharge. Alcohol cessation counseled. He understands and agrees with discharge plan.       Consultants: GI Procedures performed: Small bowel enteroscopy, capsule endoscopy.  Disposition: Home Diet recommendation:  Discharge Diet Orders (From admission, onward)     Start     Ordered   06/02/23 0000  Diet - low sodium heart healthy        06/02/23 1619           Cardiac diet DISCHARGE MEDICATION: Allergies as of 06/02/2023       Reactions   Other    Welbutrin - has paranoia         Medication List     STOP taking these medications    amLODipine 5 MG tablet Commonly known as: NORVASC   metoprolol succinate 25 MG 24 hr tablet Commonly known as: TOPROL-XL       TAKE these medications    albuterol 108 (90 Base) MCG/ACT inhaler Commonly known as: VENTOLIN HFA Inhale 2 puffs into the lungs every 6 (six) hours as needed for wheezing or shortness of breath. What changed: Another medication with the same name was removed. Continue taking this medication, and follow the directions you see here.   Aspirin Low Dose 81 MG tablet Generic drug: aspirin EC Take 81 mg by mouth daily.   cetirizine 10 MG tablet Commonly known as: ZYRTEC Take 10 mg by mouth daily.   cyanocobalamin 1000 MCG tablet Commonly known  as: VITAMIN B12 Take 1 tablet (1,000 mcg total) by mouth daily.   donepezil 10 MG tablet Commonly known as: ARICEPT Take 10 mg by mouth daily.   escitalopram 5 MG tablet Commonly known as: LEXAPRO Take 5 mg by mouth daily.   fluticasone 50 MCG/ACT nasal spray Commonly known as: FLONASE Place 2 sprays into both nostrils daily.   Fluticasone-Umeclidin-Vilant 100-62.5-25 MCG/ACT Aepb Commonly known as: Trelegy Ellipta Inhale 1 puff into the lungs daily.   Melatonin 10 MG Caps Take 1  capsule by mouth at bedtime.   naltrexone 50 MG tablet Commonly known as: DEPADE Take 50 mg by mouth daily.   omeprazole 20 MG capsule Commonly known as: PRILOSEC Take 1 capsule (20 mg total) by mouth 2 (two) times daily before a meal.   pravastatin 20 MG tablet Commonly known as: PRAVACHOL Take 20 mg by mouth at bedtime.   thiamine 100 MG tablet Commonly known as: Vitamin B-1 Take 1 tablet (100 mg total) by mouth daily. Start taking on: June 03, 2023        Discharge Exam: Ceasar Mons Weights   05/31/23 1127 06/01/23 0458 06/02/23 0349  Weight: 94.3 kg 99.4 kg 97 kg   General - Elderly looking Caucasian male, no apparent distress HEENT - PERRLA, EOMI, atraumatic head, non tender sinuses. Lung -decreased breath sounds, diffuse rhonchi Heart - S1, S2 heard, no murmurs, rubs, 1+ pedal edema Neuro - Alert, awake oriented, non focal exam. Skin - Warm and dry.  Condition at discharge: stable  The results of significant diagnostics from this hospitalization (including imaging, microbiology, ancillary and laboratory) are listed below for reference.   Imaging Studies: ECHOCARDIOGRAM COMPLETE  Result Date: 05/16/2023    ECHOCARDIOGRAM REPORT   Patient Name:   [name] Date of Exam: 05/16/2023 Medical Rec #:  [id number]    Height:       72.0 in Accession #:    [id number]   Weight:       208.0 lb Date of Birth:  28-Jun-1961     BSA:          2.166 m Patient Age:    62 years     BP:           121/64 mmHg Patient Gender: M            HR:           85 bpm. Exam Location:  [facility] Procedure: 2D Echo, Cardiac Doppler and Color Doppler Indications:     Aortic stenosis I35.0  History:         Patient has prior history of Echocardiogram examinations, most                  recent 10/17/2022. Previous Myocardial Infarction, COPD; Risk                  Factors:Hypertension.  Sonographer:     Cristela Blue Referring Phys:  [id number] [facility] MICHELLE TANG Diagnosing Phys: Marcina Millard MD IMPRESSIONS  1. Left  ventricular ejection fraction, by estimation, is 60 to 65%. The left ventricle has normal function. The left ventricle has no regional wall motion abnormalities. There is mild left ventricular hypertrophy. Left ventricular diastolic parameters were normal.  2. Right ventricular systolic function is normal. The right ventricular size is normal.  3. The mitral valve is normal in structure. Mild mitral valve regurgitation. No evidence of mitral stenosis.  4. The aortic valve is normal in structure. Aortic valve regurgitation is not  visualized. Moderate to severe aortic valve stenosis.  5. The inferior vena cava is normal in size with greater than 50% respiratory variability, suggesting right atrial pressure of 3 mmHg. FINDINGS  Left Ventricle: Left ventricular ejection fraction, by estimation, is 60 to 65%. The left ventricle has normal function. The left ventricle has no regional wall motion abnormalities. The left ventricular internal cavity size was normal in size. There is  mild left ventricular hypertrophy. Left ventricular diastolic parameters were normal. Right Ventricle: The right ventricular size is normal. No increase in right ventricular wall thickness. Right ventricular systolic function is normal. Left Atrium: Left atrial size was normal in size. Right Atrium: Right atrial size was normal in size. Pericardium: There is no evidence of pericardial effusion. Mitral Valve: The mitral valve is normal in structure. Mild mitral valve regurgitation. No evidence of mitral valve stenosis. MV peak gradient, 8.5 mmHg. The mean mitral valve gradient is 3.0 mmHg. Tricuspid Valve: The tricuspid valve is normal in structure. Tricuspid valve regurgitation is mild . No evidence of tricuspid stenosis. Aortic Valve: The aortic valve is normal in structure. Aortic valve regurgitation is not visualized. Moderate to severe aortic stenosis is present. Aortic valve mean gradient measures 22.0 mmHg. Aortic valve peak gradient  measures 37.5 mmHg. Aortic valve  area, by VTI measures 0.89 cm. Pulmonic Valve: The pulmonic valve was normal in structure. Pulmonic valve regurgitation is not visualized. No evidence of pulmonic stenosis. Aorta: The aortic root is normal in size and structure. Venous: The inferior vena cava is normal in size with greater than 50% respiratory variability, suggesting right atrial pressure of 3 mmHg. IAS/Shunts: No atrial level shunt detected by color flow Doppler.  LEFT VENTRICLE PLAX 2D LVIDd:         4.10 cm   Diastology LVIDs:         2.60 cm   LV e' medial:    6.20 cm/s LV PW:         1.00 cm   LV E/e' medial:  20.6 LV IVS:        1.30 cm   LV e' lateral:   8.59 cm/s LVOT diam:     2.00 cm   LV E/e' lateral: 14.9 LV SV:         58 LV SV Index:   27 LVOT Area:     3.14 cm  RIGHT VENTRICLE RV Basal diam:  4.00 cm RV Mid diam:    3.20 cm RV S prime:     9.03 cm/s TAPSE (M-mode): 1.6 cm LEFT ATRIUM             Index        RIGHT ATRIUM           Index LA diam:        3.60 cm 1.66 cm/m   RA Area:     12.70 cm LA Vol (A2C):   45.7 ml 21.10 ml/m  RA Volume:   27.30 ml  12.60 ml/m LA Vol (A4C):   47.7 ml 22.02 ml/m LA Biplane Vol: 47.0 ml 21.70 ml/m  AORTIC VALVE AV Area (Vmax):    0.80 cm AV Area (Vmean):   0.86 cm AV Area (VTI):     0.89 cm AV Vmax:           306.33 cm/s AV Vmean:          216.000 cm/s AV VTI:            0.650 m AV  Peak Grad:      37.5 mmHg AV Mean Grad:      22.0 mmHg LVOT Vmax:         78.20 cm/s LVOT Vmean:        58.800 cm/s LVOT VTI:          0.185 m LVOT/AV VTI ratio: 0.28  AORTA Ao Root diam: 2.80 cm MITRAL VALVE                TRICUSPID VALVE MV Area (PHT): 2.72 cm     TR Peak grad:   19.4 mmHg MV Area VTI:   1.80 cm     TR Vmax:        220.00 cm/s MV Peak grad:  8.5 mmHg MV Mean grad:  3.0 mmHg     SHUNTS MV Vmax:       1.46 m/s     Systemic VTI:  0.18 m MV Vmean:      75.7 cm/s    Systemic Diam: 2.00 cm MV Decel Time: 279 msec MV E velocity: 128.00 cm/s MV A velocity: 119.00  cm/s MV E/A ratio:  1.08 Marcina Millard MD Electronically signed by Marcina Millard MD Signature Date/Time: 05/16/2023/1:07:04 PM    Final    DG Chest Port 1 View  Result Date: 05/13/2023 CLINICAL DATA:  [id number] Dyspnea [id number] EXAM: PORTABLE CHEST 1 VIEW COMPARISON:  Chest x-ray 05/13/2023 FINDINGS: The heart and mediastinal contours are unchanged. Aortic calcification. No focal consolidation. No pulmonary edema. No pleural effusion. No pneumothorax. No acute osseous abnormality.  Sternotomy wires are intact. IMPRESSION: 1. No active disease. 2.  Aortic Atherosclerosis (ICD10-I70.0). Electronically Signed   By: Tish Frederickson M.D.   On: 05/13/2023 22:15   DG Chest 2 View  Result Date: 05/13/2023 CLINICAL DATA:  Chest pain EXAM: CHEST - 2 VIEW COMPARISON:  03/13/2023 FINDINGS: Prior CABG. There is hyperinflation of the lungs compatible with COPD. Heart and mediastinal contours are within normal limits. No focal opacities or effusions. No acute bony abnormality. IMPRESSION: COPD.  No active disease. Electronically Signed   By: Charlett Nose M.D.   On: 05/13/2023 00:28    Microbiology: Results for orders placed or performed during the hospital encounter of 10/25/22  Blood culture (routine x 2)     Status: None   Collection Time: 10/25/22  4:24 AM   Specimen: BLOOD  Result Value Ref Range Status   Specimen Description BLOOD RIGHT FA  Final   Special Requests   Final    BOTTLES DRAWN AEROBIC AND ANAEROBIC Blood Culture adequate volume   Culture   Final    NO GROWTH 5 DAYS Performed at [facility], [address], [place], [place] [address]    Report Status 10/30/2022 FINAL  Final  Resp Panel by RT-PCR (Flu A&B, Covid) Anterior Nasal Swab     Status: None   Collection Time: 10/25/22  4:24 AM   Specimen: Anterior Nasal Swab  Result Value Ref Range Status   SARS Coronavirus 2 by RT PCR NEGATIVE NEGATIVE Final    Comment: (NOTE) SARS-CoV-2 target nucleic acids are NOT  DETECTED.  The SARS-CoV-2 RNA is generally detectable in upper respiratory specimens during the acute phase of infection. The lowest concentration of SARS-CoV-2 viral copies this assay can detect is 138 copies/mL. A negative result does not preclude SARS-Cov-2 infection and should not be used as the sole basis for treatment or other patient management decisions. A negative result may occur with  improper specimen  collection/handling, submission of specimen other than nasopharyngeal swab, presence of viral mutation(s) within the areas targeted by this assay, and inadequate number of viral copies(<138 copies/mL). A negative result must be combined with clinical observations, patient history, and epidemiological information. The expected result is Negative.  Fact Sheet for Patients:  BloggerCourse.com  Fact Sheet for Healthcare Providers:  SeriousBroker.it  This test is no t yet approved or cleared by the [place] FDA and  has been authorized for detection and/or diagnosis of SARS-CoV-2 by FDA under an Emergency Use Authorization (EUA). This EUA will remain  in effect (meaning this test can be used) for the duration of the COVID-19 declaration under Section 564(b)(1) of the Act, 21 U.S.C.section 360bbb-3(b)(1), unless the authorization is terminated  or revoked sooner.       Influenza A by PCR NEGATIVE NEGATIVE Final   Influenza B by PCR NEGATIVE NEGATIVE Final    Comment: (NOTE) The Xpert Xpress SARS-CoV-2/FLU/RSV plus assay is intended as an aid in the diagnosis of influenza from Nasopharyngeal swab specimens and should not be used as a sole basis for treatment. Nasal washings and aspirates are unacceptable for Xpert Xpress SARS-CoV-2/FLU/RSV testing.  Fact Sheet for Patients: BloggerCourse.com  Fact Sheet for Healthcare Providers: SeriousBroker.it  This test is not yet  approved or cleared by the [place] FDA and has been authorized for detection and/or diagnosis of SARS-CoV-2 by FDA under an Emergency Use Authorization (EUA). This EUA will remain in effect (meaning this test can be used) for the duration of the COVID-19 declaration under Section 564(b)(1) of the Act, 21 U.S.C. section 360bbb-3(b)(1), unless the authorization is terminated or revoked.  Performed at [facility], [address] [place], [place] [address]   Blood culture (routine x 2)     Status: None   Collection Time: 10/25/22  4:45 AM   Specimen: BLOOD  Result Value Ref Range Status   Specimen Description BLOOD LEFT [facility]  Final   Special Requests   Final    BOTTLES DRAWN AEROBIC AND ANAEROBIC Blood Culture results may not be optimal due to an excessive volume of blood received in culture bottles   Culture   Final    NO GROWTH 5 DAYS Performed at [facility], [address] [place], [place] [address]    Report Status 10/30/2022 FINAL  Final    Labs: CBC: Recent Labs  Lab 05/31/23 2334 06/01/23 0207 06/01/23 1028 06/01/23 1634 06/02/23 0426  WBC 12.6* 13.8* 11.5* 14.2* 15.9*  HGB 7.6* 7.7* 8.7* 8.7* 8.3*  HCT 25.0* 26.1* 28.5* 28.7* 27.6*  MCV 77.4* 77.2* 78.3* 79.1* 79.1*  PLT 381 383 362 365 370   Basic Metabolic Panel: Recent Labs  Lab 05/31/23 1134 06/01/23 0207 06/02/23 0426  NA 136 133* 134*  K 4.0 4.2 3.5  CL 102 100 97*  CO2 24 26 26   GLUCOSE 115* 103* 119*  BUN 10 9 10   CREATININE 0.90 0.95 0.98  CALCIUM 9.0 8.3* 8.6*   Liver Function Tests: Recent Labs  Lab 05/31/23 1134  AST 21  ALT 13  ALKPHOS 54  BILITOT 1.3*  PROT 7.9  ALBUMIN 3.9   CBG: Recent Labs  Lab 06/01/23 0819 06/02/23 0839  GLUCAP 92 116*    Discharge time spent: greater than 30 minutes.  Signed: Marcelino Duster, MD [facility] 06/02/2023

## 2023-06-02 NOTE — Progress Notes (Signed)
VCE report  Study is complete, capsule reached cecum Normal small bowel transit, no active bleeding in the entire small bowel Tattoo in mid small bowel Two non-bleeding medium sized AVMs in distal small bowel  Recs: Distal small bowel AVMs, non bleeding Recommend Retrograde enteroscopy if patient rebleeds Ok to discharge home today Resume diet Avoid alcohol use and NSAID use  Arlyss Repress, MD [facility], [facility] [address]  [address]  [place], [place] [address]  Main: [phone]  Fax: [phone] Pager: [phone]

## 2023-06-04 ENCOUNTER — Encounter: Payer: Self-pay | Admitting: Gastroenterology

## 2023-06-24 NOTE — Progress Notes (Deleted)
Celso [name], PA-C [address]  [address]  [place], [place] [address]  Main: [phone]  Fax: [phone]   Primary Care Physician: Jodi Marble, NP  Primary Gastroenterologist:  Celso [name], PA-C / Dr. Midge Minium   CC: Follow-up GI bleed, melena, anemia  HPI: [name] is a 62 y.o. male, established patient Dr. Venetia Night, presents for hospital follow-up.  He was admitted to [facility] 05/31/2023 until 06/02/2023 for melena and symptomatic anemia.  During hospitalization he received 1 unit of PRBC.  Hemoglobin stable around 8g.  He was seen by GI Dr. Allegra Lai who advised push enteroscopy which was noted to be normal. A capsule endos on 6 copy showed 2 distal small bowel AVMs which were non bleeding.  If rebleeding occurred, Dr. Allegra Lai recommended retrograde enteroscopy.  Patient had no more bleeding and was discharged home in stable condition.  He had a video capsule endoscopy which showed active bleeding in the proximal small bowel.  Underwent push enteroscopy 05/16/23 which showed nonbleeding AVMs in the jejunum, treated with APC.  Medical history significant of GIB, alcohol abuse, COPD, hypertension, CAD with prior CABG, prior CVA, HLD, stroke, dementia, dCHF. He lives in his car.  EGD 05/13/23 by Dr. Servando Snare was normal.  Colonoscopy 01/2022 by Dr. Servando Snare showed 3 polyps sessile serrated and tubular adenoma (5mm - 6mm) removed and small internal hemorrhoids.  Excellent prep.  5-year repeat.  Last labs 06/02/2023 showed hemoglobin 8.3, hematocrit 27, MCV 79.  Current Outpatient Medications  Medication Sig Dispense Refill   albuterol (VENTOLIN HFA) 108 (90 Base) MCG/ACT inhaler Inhale 2 puffs into the lungs every 6 (six) hours as needed for wheezing or shortness of breath. 8 g 2   aspirin EC (ASPIRIN LOW DOSE) 81 MG tablet Take 81 mg by mouth daily.     cetirizine (ZYRTEC) 10 MG tablet Take 10 mg by mouth daily.     donepezil (ARICEPT) 10 MG tablet Take 10 mg by mouth daily.       escitalopram (LEXAPRO) 5 MG tablet Take 5 mg by mouth daily.     fluticasone (FLONASE) 50 MCG/ACT nasal spray Place 2 sprays into both nostrils daily.     Melatonin 10 MG CAPS Take 1 capsule by mouth at bedtime.     naltrexone (DEPADE) 50 MG tablet Take 50 mg by mouth daily.     omeprazole (PRILOSEC) 20 MG capsule Take 1 capsule (20 mg total) by mouth 2 (two) times daily before a meal. 60 capsule 0   pravastatin (PRAVACHOL) 20 MG tablet Take 20 mg by mouth at bedtime.      thiamine (VITAMIN B-1) 100 MG tablet Take 1 tablet (100 mg total) by mouth daily. 30 tablet 3   vitamin B-12 (CYANOCOBALAMIN) 1000 MCG tablet Take 1 tablet (1,000 mcg total) by mouth daily. 30 tablet 0   No current facility-administered medications for this visit.    Allergies as of 06/25/2023 - Review Complete 06/01/2023  Allergen Reaction Noted   Other  01/17/2022    Past Medical History:  Diagnosis Date   COPD (chronic obstructive pulmonary disease) (HCC)    CVA (cerebral infarction)    Headache    mild, since stroke 2012   Hypertension    MI (myocardial infarction) (HCC) 05/09/2012   Reading difficulty    pt reports he reads at "about a second grade level"   Stroke (HCC) 2012   numbness to left hand    Wears dentures    full upper and  lower    Past Surgical History:  Procedure Laterality Date   CAROTID ENDARTERECTOMY Left    2012 or 2013   CHOLECYSTECTOMY N/A 07/26/2016   Procedure: LAPAROSCOPIC CHOLECYSTECTOMY;  Surgeon: Lattie Haw, MD;  Location: [facility] ORS;  Service: General;  Laterality: N/A;   COLONOSCOPY WITH PROPOFOL N/A 02/09/2022   Procedure: COLONOSCOPY WITH PROPOFOL;  Surgeon: Midge Minium, MD;  Location: [facility] ENDOSCOPY;  Service: Endoscopy;  Laterality: N/A;   CORONARY ARTERY BYPASS GRAFT  05/10/2012   3 vessel   ENTEROSCOPY N/A 05/16/2023   Procedure: ENTEROSCOPY;  Surgeon: Wyline Mood, MD;  Location: [facility] ENDOSCOPY;  Service: Gastroenterology;  Laterality: N/A;   ENTEROSCOPY N/A  06/01/2023   Procedure: ENTEROSCOPY;  Surgeon: Toney Reil, MD;  Location: [facility] ENDOSCOPY;  Service: Gastroenterology;  Laterality: N/A;   ESOPHAGOGASTRODUODENOSCOPY  05/13/2023   Procedure: ESOPHAGOGASTRODUODENOSCOPY (EGD);  Surgeon: Midge Minium, MD;  Location: [facility] ENDOSCOPY;  Service: Endoscopy;;   ESOPHAGOGASTRODUODENOSCOPY (EGD) WITH PROPOFOL N/A 10/28/2022   Procedure: ESOPHAGOGASTRODUODENOSCOPY (EGD) WITH PROPOFOL;  Surgeon: Jaynie Collins, DO;  Location: [facility] ENDOSCOPY;  Service: Gastroenterology;  Laterality: N/A;   GIVENS CAPSULE STUDY  05/13/2023   Procedure: GIVENS CAPSULE STUDY;  Surgeon: Midge Minium, MD;  Location: [facility] ENDOSCOPY;  Service: Endoscopy;;   GIVENS CAPSULE STUDY  06/01/2023   Procedure: GIVENS CAPSULE STUDY;  Surgeon: Toney Reil, MD;  Location: [facility] ENDOSCOPY;  Service: Gastroenterology;;    Review of Systems:    All systems reviewed and negative except where noted in HPI.   Physical Examination:   There were no vitals taken for this visit.  General: Well-nourished, well-developed in no acute distress.  Eyes: No icterus. Conjunctivae pink. Mouth: Oropharyngeal mucosa moist and pink , no lesions erythema or exudate. Lungs: Clear to auscultation bilaterally. Non-labored. Heart: Regular rate and rhythm, no murmurs rubs or gallops.  Abdomen: Bowel sounds are normal; Abdomen is Soft; No hepatosplenomegaly, masses or hernias;  No Abdominal Tenderness; No guarding or rebound tenderness. Extremities: No lower extremity edema. No clubbing or deformities. Neuro: Alert and oriented x 3.  Grossly intact. Skin: Warm and dry, no jaundice.   Psych: Alert and cooperative, normal mood and affect.   Imaging Studies: No results found.  Assessment and Plan:   [name] is a 62 y.o. y/o male presents for hospital follow-up of melena, GI bleed from small bowel AVMs, and acute blood loss anemia.    Celso [name], PA-C  Follow up in ***  BP check  ***

## 2023-06-25 ENCOUNTER — Ambulatory Visit: Payer: 59 | Admitting: Physician Assistant

## 2023-06-29 ENCOUNTER — Emergency Department
Admission: EM | Admit: 2023-06-29 | Discharge: 2023-06-29 | Disposition: A | Discharge status: Home / Self Care | Payer: 59 | Attending: Emergency Medicine | Admitting: Emergency Medicine

## 2023-06-29 ENCOUNTER — Emergency Department: Payer: 59

## 2023-06-29 ENCOUNTER — Other Ambulatory Visit: Payer: Self-pay

## 2023-06-29 DIAGNOSIS — Z951 Presence of aortocoronary bypass graft: Secondary | ICD-10-CM | POA: Insufficient documentation

## 2023-06-29 DIAGNOSIS — I110 Hypertensive heart disease with heart failure: Secondary | ICD-10-CM | POA: Insufficient documentation

## 2023-06-29 DIAGNOSIS — I2510 Atherosclerotic heart disease of native coronary artery without angina pectoris: Secondary | ICD-10-CM | POA: Insufficient documentation

## 2023-06-29 DIAGNOSIS — K5900 Constipation, unspecified: Secondary | ICD-10-CM | POA: Diagnosis not present

## 2023-06-29 DIAGNOSIS — I5030 Unspecified diastolic (congestive) heart failure: Secondary | ICD-10-CM | POA: Insufficient documentation

## 2023-06-29 DIAGNOSIS — F0390 Unspecified dementia without behavioral disturbance: Secondary | ICD-10-CM | POA: Insufficient documentation

## 2023-06-29 DIAGNOSIS — J449 Chronic obstructive pulmonary disease, unspecified: Secondary | ICD-10-CM | POA: Insufficient documentation

## 2023-06-29 MED ORDER — FLEET ENEMA 7-19 GM/118ML RE ENEM: 1.0000 | ENEMA | Freq: Once | RECTAL | 0 refills | Status: DC | PRN

## 2023-06-29 MED ORDER — FLEET ENEMA 7-19 GM/118ML RE ENEM
1.0000 | ENEMA | Freq: Once | RECTAL | Status: AC
  Administered 2023-06-29: 1 via RECTAL

## 2023-06-29 MED ORDER — POLYETHYLENE GLYCOL 3350 17 G PO PACK: 17.0000 g | PACK | Freq: Every day | ORAL | 0 refills | Status: DC

## 2023-06-29 NOTE — Discharge Instructions (Addendum)
Take the MiraLAX daily for the next 1 to 2 weeks.  You may use the Fleet enema again if you feel that there is remaining stool in your rectum.  Return to the ER for new, worsening, or persistent severe abdominal swelling or pain, blood in the stool, vomiting, or any other new or worsening symptoms that concern you.

## 2023-06-29 NOTE — ED Triage Notes (Signed)
Pt here with constipation. Pt had an enteroscopy on 06/01/2023 and states he has had trouble having bowel movements since. Pt states he has been eating a lot but only has been passing flatus.

## 2023-06-29 NOTE — ED Provider Notes (Signed)
[facility] Provider Note    Event Date/Time   First MD Initiated Contact with Patient 06/29/23 1516     (approximate)   History   Constipation   HPI  [name] is a 62 y.o. male with a history of GI bleed, alcohol abuse, COPD, hypertension, CAD status post CABG, CVA, hyperlipidemia, stroke, dementia, diastolic CHF who presents with constipation for the last few weeks.  The patient states that he last had a bowel movement 6 days ago.  He has had some hard stools and has had to strain significantly.  He reports tightness in his abdomen but denies any abdominal pain.  He has had no nausea or vomiting.  He does report some pressure in his rectum when he tries to have bowel movement.  He denies any blood in the stool or dark tarry stools.  He has not taken anything for this at home.  I reviewed the past medical records.  The patient was most recently admitted last month after presenting with dark stool and decrease in hemoglobin.  He had small bowel capsule endoscopy which showed nonbleeding AVMs.   Physical Exam   Triage Vital Signs: ED Triage Vitals [06/29/23 1435]  Encounter Vitals Group     BP (!) 134/104     Systolic BP Percentile      Diastolic BP Percentile      Pulse Rate 77     Resp 18     Temp 98.2 F (36.8 C)     Temp Source Oral     SpO2 99 %     Weight 213 lb 13.5 oz (97 kg)     Height 6' (1.829 m)     Head Circumference      Peak Flow      Pain Score 9     Pain Loc      Pain Education      Exclude from Growth Chart     Most recent vital signs: Vitals:   06/29/23 1435  BP: (!) 134/104  Pulse: 77  Resp: 18  Temp: 98.2 F (36.8 C)  SpO2: 99%     General: Awake, no distress.  CV:  Good peripheral perfusion.  Resp:  Normal effort.  Abd:  Soft and nontender.  No distention.  Other:  Soft palpable stool on rectal exam.  No impacted stool.  No hemorrhoids or bleeding.   ED Results / Procedures / Treatments   Labs (all  labs ordered are listed, but only abnormal results are displayed) Labs Reviewed - No data to display   EKG     RADIOLOGY  XR abdomen: I independently viewed and interpreted the images; there is significant stool in the colon with no dilated bowel loops or free air  PROCEDURES:  Critical Care performed: No  Procedures   MEDICATIONS ORDERED IN ED: Medications  sodium phosphate (FLEET) 7-19 GM/118ML enema 1 enema (1 enema Rectal Given 06/29/23 1618)     IMPRESSION / MDM / ASSESSMENT AND PLAN / ED COURSE  I reviewed the triage vital signs and the nursing notes.  62 year old male with PMH as noted above presents with constipation over the last few weeks.  On exam he is well-appearing, abdomen is soft and nontender, and he has no impacted stool although I can palpate some softer stool on rectal exam.  Differential diagnosis includes, but is not limited to, simple constipation, medication induced constipation.  There is no evidence of bowel obstruction or other acute complication.  We will try an enema and send the patient home on oral laxatives.  Patient's presentation is most consistent with acute complicated illness / injury requiring diagnostic workup.  ----------------------------------------- 5:04 PM on 06/29/2023 -----------------------------------------   The patient had a good bowel movement with the enema.  He is stable for discharge home at this time.  I have prescribed an additional fleets enema as well as MiraLAX and instructed him on their use.  I gave strict return precautions and he expressed understanding.   FINAL CLINICAL IMPRESSION(S) / ED DIAGNOSES   Final diagnoses:  Constipation, unspecified constipation type     Rx / DC Orders   ED Discharge Orders          Ordered    polyethylene glycol (MIRALAX) 17 g packet  Daily        06/29/23 1703    sodium phosphate (FLEET) 7-19 GM/118ML ENEM  Once PRN        06/29/23 1703             Note:  This  document was prepared using Dragon voice recognition software and may include unintentional dictation errors.    Dionne Bucy, MD 06/29/23 1721

## 2023-06-29 NOTE — ED Notes (Signed)
Patient reports having a large BM in the toilet in the restroom. Dr. Marisa Severin aware.

## 2023-07-02 ENCOUNTER — Telehealth: Payer: Self-pay | Admitting: Physician Assistant

## 2023-07-02 NOTE — Telephone Encounter (Signed)
Patient came in the office wanting to confirm appointment and had a few question.

## 2023-08-01 NOTE — Progress Notes (Deleted)
Celso [name], PA-C [address]  [address]  [place], [place] [address]  Main: [phone]  Fax: [phone]   Gastroenterology Consultation  Referring Provider:     Jodi Marble, NP Primary Care Physician:  Jodi Marble, NP Primary Gastroenterologist:  Celso [name], PA-C / Dr. Midge Minium   Reason for Consultation:     Fecal incontinence, constipation, rectal bleeding, Anemia        HPI:   [name] is a 62 y.o. y/o male referred for consultation & management  by Jodi Marble, NP.    62 y.o. male with a history of GI bleed, alcohol abuse, COPD, hypertension, CAD status post CABG, CVA, hyperlipidemia, stroke, dementia, diastolic CHF.  He is homeless and lives in a car.  Takes 81 mg aspirin daily and omeprazole 20 mg twice daily.  He was seen at [facility] ED 06/29/2023 for constipation.  Abdominal x-ray showed significant stool in the colon.  No obstruction.  Started on MiraLAX and fleets enema with benefit.  Hospitalized 6/13 until 06/02/2023 for GI bleed, melena and anemia. Enteroscopy 6/14.  Hospitalized 5/26 - 05/18/23 for anemia and melena.  Normal EGD 05/13/2023.  Had video capsule endoscopy which revealed active bleeding in the proximal small bowel, underwent push enteroscopy on 5/29 which revealed nonbleeding AVMs in the jejunum, treated with APC.   EGD 10/2022 showed biopsies negative for H. pylori and Barrett's.  Last colonoscopy 01/2022 by Dr. Servando Snare showed excellent prep, and 3 small polyps (2 sessile serrated and 1 tubular adenoma) removed (5 mm to 6 mm).  Small internal hemorrhoids.  Last labs 06/02/2023 showed stable hemoglobin 8.3, hematocrit 27, MCV 79. Labs 05/31/2023 showed hemoglobin 7.1.   Past Medical History:  Diagnosis Date   COPD (chronic obstructive pulmonary disease) (HCC)    CVA (cerebral infarction)    Headache    mild, since stroke 2012   Hypertension    MI (myocardial infarction) (HCC) 05/09/2012   Reading difficulty    pt reports he  reads at "about a second grade level"   Stroke [facility]) 2012   numbness to left hand    Wears dentures    full upper and lower    Past Surgical History:  Procedure Laterality Date   CAROTID ENDARTERECTOMY Left    2012 or 2013   CHOLECYSTECTOMY N/A 07/26/2016   Procedure: LAPAROSCOPIC CHOLECYSTECTOMY;  Surgeon: Lattie Haw, MD;  Location: [facility] ORS;  Service: General;  Laterality: N/A;   COLONOSCOPY WITH PROPOFOL N/A 02/09/2022   Procedure: COLONOSCOPY WITH PROPOFOL;  Surgeon: Midge Minium, MD;  Location: [facility] ENDOSCOPY;  Service: Endoscopy;  Laterality: N/A;   CORONARY ARTERY BYPASS GRAFT  05/10/2012   3 vessel   ENTEROSCOPY N/A 05/16/2023   Procedure: ENTEROSCOPY;  Surgeon: Wyline Mood, MD;  Location: [facility] ENDOSCOPY;  Service: Gastroenterology;  Laterality: N/A;   ENTEROSCOPY N/A 06/01/2023   Procedure: ENTEROSCOPY;  Surgeon: Toney Reil, MD;  Location: [facility] ENDOSCOPY;  Service: Gastroenterology;  Laterality: N/A;   ESOPHAGOGASTRODUODENOSCOPY  05/13/2023   Procedure: ESOPHAGOGASTRODUODENOSCOPY (EGD);  Surgeon: Midge Minium, MD;  Location: [facility] ENDOSCOPY;  Service: Endoscopy;;   ESOPHAGOGASTRODUODENOSCOPY (EGD) WITH PROPOFOL N/A 10/28/2022   Procedure: ESOPHAGOGASTRODUODENOSCOPY (EGD) WITH PROPOFOL;  Surgeon: Jaynie Collins, DO;  Location: [facility] ENDOSCOPY;  Service: Gastroenterology;  Laterality: N/A;   GIVENS CAPSULE STUDY  05/13/2023   Procedure: GIVENS CAPSULE STUDY;  Surgeon: Midge Minium, MD;  Location: [facility] ENDOSCOPY;  Service: Endoscopy;;   GIVENS CAPSULE STUDY  06/01/2023   Procedure: GIVENS CAPSULE  STUDY;  Surgeon: Toney Reil, MD;  Location: [facility] ENDOSCOPY;  Service: Gastroenterology;;    Prior to Admission medications   Medication Sig Start Date End Date Taking? Authorizing Provider  albuterol (VENTOLIN HFA) 108 (90 Base) MCG/ACT inhaler Inhale 2 puffs into the lungs every 6 (six) hours as needed for wheezing or shortness of breath. 03/13/23   Willy Eddy, MD  aspirin EC (ASPIRIN LOW DOSE) 81 MG tablet Take 81 mg by mouth daily. 04/27/23   [provider]  cetirizine (ZYRTEC) 10 MG tablet Take 10 mg by mouth daily. 08/04/22   [provider]  donepezil (ARICEPT) 10 MG tablet Take 10 mg by mouth daily.     [provider]  escitalopram (LEXAPRO) 5 MG tablet Take 5 mg by mouth daily. 04/19/23   [provider]  fluticasone (FLONASE) 50 MCG/ACT nasal spray Place 2 sprays into both nostrils daily. 08/04/22   [provider]  Melatonin 10 MG CAPS Take 1 capsule by mouth at bedtime. 08/04/22   [provider]  naltrexone (DEPADE) 50 MG tablet Take 50 mg by mouth daily. 04/16/23   [provider]  omeprazole (PRILOSEC) 20 MG capsule Take 1 capsule (20 mg total) by mouth 2 (two) times daily before a meal. 05/18/23   Sreenath, Sudheer B, MD  polyethylene glycol (MIRALAX) 17 g packet Take 17 g by mouth daily. 06/29/23   Dionne Bucy, MD  pravastatin (PRAVACHOL) 20 MG tablet Take 20 mg by mouth at bedtime.     [provider]  sodium phosphate (FLEET) 7-19 GM/118ML ENEM Place 133 mLs (1 enema total) rectally once as needed for up to 1 dose for severe constipation. 06/29/23   Dionne Bucy, MD  thiamine (VITAMIN B-1) 100 MG tablet Take 1 tablet (100 mg total) by mouth daily. 06/03/23 10/01/23  Marcelino Duster, MD  vitamin B-12 (CYANOCOBALAMIN) 1000 MCG tablet Take 1 tablet (1,000 mcg total) by mouth daily. 06/05/21   Rolly Salter, MD    Family History  Problem Relation Age of Onset   Pneumonia Mother      Social History   Tobacco Use   Smoking status: Former    Current packs/day: 0.00    Average packs/day: 4.0 packs/day for 43.0 years (172.0 ttl pk-yrs)    Types: Cigarettes    Start date: 68    Quit date: 49    Years since quitting: 31.6   Smokeless tobacco: Current    Types: Snuff   Tobacco comments:    changed to dip  Vaping Use   Vaping status:  Never Used  Substance Use Topics   Alcohol use: Yes    Alcohol/week: 26.0 standard drinks of alcohol    Types: 26 Cans of beer per week   Drug use: No    Allergies as of 08/02/2023 - Review Complete 06/29/2023  Allergen Reaction Noted   Other  01/17/2022    Review of Systems:    All systems reviewed and negative except where noted in HPI.   Physical Exam:  There were no vitals taken for this visit. No LMP for male patient.  General:   Alert,  Well-developed, well-nourished, pleasant and cooperative in NAD Lungs:  Respirations even and unlabored.  Clear throughout to auscultation.   No wheezes, crackles, or rhonchi. No acute distress. Heart:  Regular rate and rhythm; no murmurs, clicks, rubs, or gallops. Abdomen:  Normal bowel sounds.  No bruits.  Soft, and non-distended without masses, hepatosplenomegaly or hernias noted.  No Tenderness.  No guarding or rebound tenderness.    Neurologic:  Alert and oriented x3;  grossly normal neurologically. Psych:  Alert and cooperative. Normal mood and affect.  Imaging Studies: No results found.  Assessment and Plan:   Perfecto Bur is a 62 y.o. y/o male has been referred for hospital follow-up of recurrent GI bleeding from small bowel AVMs with symptomatic anemia.  Multiple hospitalizations in the past few months.  EGD unrevealing.  Last colonoscopy 01/2022 showed 3 small polyps removed.  History of alcohol abuse.  Takes 81 mg aspirin daily and omeprazole 20 Mg twice daily.  Recently treated with MiraLAX and enema for constipation.  1.  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  Please stop alcohol.  2.  Symptomatic anemia -due to recurrent GI bleed and iron deficiency  Labs: CBC, iron panel, ferritin, B12, folate  3.  Alcohol use disorder  Encouraged alcohol cessation.  4.  Constipation  Continue MiraLAX daily.  Follow up ***  Celso [name], PA-C    BP check ***

## 2023-08-02 ENCOUNTER — Ambulatory Visit: Payer: 59 | Admitting: Physician Assistant

## 2023-08-02 DIAGNOSIS — F1090 Alcohol use, unspecified, uncomplicated: Secondary | ICD-10-CM

## 2023-08-02 DIAGNOSIS — D649 Anemia, unspecified: Secondary | ICD-10-CM

## 2023-08-02 DIAGNOSIS — K921 Melena: Secondary | ICD-10-CM

## 2023-10-11 ENCOUNTER — Other Ambulatory Visit (INDEPENDENT_AMBULATORY_CARE_PROVIDER_SITE_OTHER): Payer: Self-pay | Admitting: Nurse Practitioner

## 2023-10-11 DIAGNOSIS — I739 Peripheral vascular disease, unspecified: Secondary | ICD-10-CM

## 2023-10-16 ENCOUNTER — Encounter (INDEPENDENT_AMBULATORY_CARE_PROVIDER_SITE_OTHER): Payer: Self-pay | Admitting: Vascular Surgery

## 2023-10-16 ENCOUNTER — Ambulatory Visit (INDEPENDENT_AMBULATORY_CARE_PROVIDER_SITE_OTHER): Payer: 59

## 2023-10-16 ENCOUNTER — Ambulatory Visit (INDEPENDENT_AMBULATORY_CARE_PROVIDER_SITE_OTHER): Payer: 59 | Admitting: Vascular Surgery

## 2023-10-16 VITALS — BP 157/87 | HR 82 | Resp 16 | Wt 235.0 lb

## 2023-10-16 DIAGNOSIS — I10 Essential (primary) hypertension: Secondary | ICD-10-CM | POA: Diagnosis not present

## 2023-10-16 DIAGNOSIS — I70213 Atherosclerosis of native arteries of extremities with intermittent claudication, bilateral legs: Secondary | ICD-10-CM | POA: Diagnosis not present

## 2023-10-16 DIAGNOSIS — E782 Mixed hyperlipidemia: Secondary | ICD-10-CM | POA: Diagnosis not present

## 2023-10-16 DIAGNOSIS — I739 Peripheral vascular disease, unspecified: Secondary | ICD-10-CM | POA: Diagnosis not present

## 2023-10-16 DIAGNOSIS — N1831 Chronic kidney disease, stage 3a: Secondary | ICD-10-CM | POA: Diagnosis not present

## 2023-10-16 NOTE — Assessment & Plan Note (Signed)
Noninvasive studies were performed today showing a right ABI of 0.93 and a left ABI of 0.78.  Digit pressure is 139 on the right and 100 on the left..  He does have significant claudication symptoms.  These are predominantly on the left leg.  We had a long discussion today regarding the pathophysiology and natural history of peripheral arterial disease.  He does not have any limb threatening symptoms that would require immediate revascularization.  He sounds like he is mild to moderately limited in terms of activity and does not desire any intervention at current which is certainly reasonable.  I will plan to see him back in 3 to 4 months and do an arterial duplex as well as ABIs for further evaluation.  Will continue his antiplatelet therapy and statin agent.

## 2023-10-16 NOTE — Assessment & Plan Note (Signed)
blood pressure control important in reducing the progression of atherosclerotic disease. On appropriate oral medications.  

## 2023-10-16 NOTE — Assessment & Plan Note (Signed)
Hydrate and limit contrast if ever has angiogram.  Follow with non-invasive studies.

## 2023-10-16 NOTE — Progress Notes (Signed)
Patient ID: [name], male   DOB: 10-27-1961, 62 y.o.   MRN: [id number]  Chief Complaint  Patient presents with   New Patient (Initial Visit)    Ref Carmela Hurt consult claudication    HPI [name] is a 62 y.o. male.  I am asked to see the patient by Jodi Marble for evaluation of PAD with claudication.  He was seen in our office about 5 or 6 years ago and found to have good blood flow at that time.  He has had no neuropathy symptoms and these have persisted.  He has a previous history of stroke as well.  He has been bothered by worsening fatigue and tiredness in his legs with short distances of activity.  He does get cramping in his calves and lower legs predominantly on the left.  This is progressed over several years.  He has no open wounds or infection.  He does not describe symptoms of ischemic rest pain.  Noninvasive studies were performed today showing a right ABI of 0.93 and a left ABI of 0.78.  Digit pressure is 139 on the right and 100 on the left..     Past Medical History:  Diagnosis Date   COPD (chronic obstructive pulmonary disease) (HCC)    CVA (cerebral infarction)    Headache    mild, since stroke 2012   Hypertension    MI (myocardial infarction) (HCC) 05/09/2012   Reading difficulty    pt reports he reads at "about a second grade level"   Stroke [facility]) 2012   numbness to left hand    Wears dentures    full upper and lower    Past Surgical History:  Procedure Laterality Date   CAROTID ENDARTERECTOMY Left    2012 or 2013   CHOLECYSTECTOMY N/A 07/26/2016   Procedure: LAPAROSCOPIC CHOLECYSTECTOMY;  Surgeon: Lattie Haw, MD;  Location: [facility] ORS;  Service: General;  Laterality: N/A;   COLONOSCOPY WITH PROPOFOL N/A 02/09/2022   Procedure: COLONOSCOPY WITH PROPOFOL;  Surgeon: Midge Minium, MD;  Location: [facility] ENDOSCOPY;  Service: Endoscopy;  Laterality: N/A;   CORONARY ARTERY BYPASS GRAFT  05/10/2012   3 vessel   ENTEROSCOPY N/A 05/16/2023   Procedure:  ENTEROSCOPY;  Surgeon: Wyline Mood, MD;  Location: [facility] ENDOSCOPY;  Service: Gastroenterology;  Laterality: N/A;   ENTEROSCOPY N/A 06/01/2023   Procedure: ENTEROSCOPY;  Surgeon: Toney Reil, MD;  Location[facility] ENDOSCOPY;  Service: Gastroenterology;  Laterality: N/A;   ESOPHAGOGASTRODUODENOSCOPY  05/13/2023   Procedure: ESOPHAGOGASTRODUODENOSCOPY (EGD);  Surgeon: Midge Minium, MD;  Location: [facility] ENDOSCOPY;  Service: Endoscopy;;   ESOPHAGOGASTRODUODENOSCOPY (EGD) WITH PROPOFOL N/A 10/28/2022   Procedure: ESOPHAGOGASTRODUODENOSCOPY (EGD) WITH PROPOFOL;  Surgeon: Jaynie Collins, DO;  Location: [facility] ENDOSCOPY;  Service: Gastroenterology;  Laterality: N/A;   GIVENS CAPSULE STUDY  05/13/2023   Procedure: GIVENS CAPSULE STUDY;  Surgeon: Midge Minium, MD;  Location: [facility] ENDOSCOPY;  Service: Endoscopy;;   GIVENS CAPSULE STUDY  06/01/2023   Procedure: GIVENS CAPSULE STUDY;  Surgeon: Toney Reil, MD;  Location: [facility] ENDOSCOPY;  Service: Gastroenterology;;     Family History  Problem Relation Age of Onset   Pneumonia Mother   No bleeding or clotting disorders No aneurysms   Social History   Tobacco Use   Smoking status: Former    Current packs/day: 0.00    Types: Cigarettes    Quit date: 1993    Years since quitting: 31.8   Smokeless tobacco: Current    Types: Snuff   Tobacco  comments:    changed to dip  Vaping Use   Vaping status: Never Used  Substance Use Topics   Alcohol use: Yes    Alcohol/week: 26.0 standard drinks of alcohol    Types: 26 Cans of beer per week   Drug use: No     Allergies  Allergen Reactions   Other     Welbutrin - has paranoia     Current Outpatient Medications  Medication Sig Dispense Refill   aspirin EC (ASPIRIN LOW DOSE) 81 MG tablet Take 81 mg by mouth daily.     cetirizine (ZYRTEC) 10 MG tablet Take 10 mg by mouth daily.     donepezil (ARICEPT) 10 MG tablet Take 10 mg by mouth daily.      escitalopram (LEXAPRO) 5 MG tablet  Take 5 mg by mouth daily.     fluticasone (FLONASE) 50 MCG/ACT nasal spray Place 2 sprays into both nostrils daily.     Melatonin 10 MG CAPS Take 1 capsule by mouth at bedtime.     naltrexone (DEPADE) 50 MG tablet Take 50 mg by mouth daily.     omeprazole (PRILOSEC) 20 MG capsule Take 1 capsule (20 mg total) by mouth 2 (two) times daily before a meal. 60 capsule 0   polyethylene glycol (MIRALAX) 17 g packet Take 17 g by mouth daily. 14 each 0   pravastatin (PRAVACHOL) 20 MG tablet Take 20 mg by mouth at bedtime.      sodium phosphate (FLEET) 7-19 GM/118ML ENEM Place 133 mLs (1 enema total) rectally once as needed for up to 1 dose for severe constipation. 133 mL 0   TRELEGY ELLIPTA 100-62.5-25 MCG/ACT AEPB Inhale 1 puff into the lungs daily.     vitamin B-12 (CYANOCOBALAMIN) 1000 MCG tablet Take 1 tablet (1,000 mcg total) by mouth daily. 30 tablet 0   albuterol (VENTOLIN HFA) 108 (90 Base) MCG/ACT inhaler Inhale 2 puffs into the lungs every 6 (six) hours as needed for wheezing or shortness of breath. (Patient not taking: Reported on 10/16/2023) 8 g 2   No current facility-administered medications for this visit.      REVIEW OF SYSTEMS (Negative unless checked)  Constitutional: [] Weight loss  [] Fever  [] Chills Cardiac: [] Chest pain   [] Chest pressure   [] Palpitations   [] Shortness of breath when laying flat   [] Shortness of breath at rest   [] Shortness of breath with exertion. Vascular:  [x] Pain in legs with walking   [] Pain in legs at rest   [] Pain in legs when laying flat   [x] Claudication   [] Pain in feet when walking  [] Pain in feet at rest  [] Pain in feet when laying flat   [] History of DVT   [] Phlebitis   [] Swelling in legs   [] Varicose veins   [] Non-healing ulcers Pulmonary:   [] Uses home oxygen   [] Productive cough   [] Hemoptysis   [] Wheeze  [x] COPD   [] Asthma Neurologic:  [] Dizziness  [] Blackouts   [] Seizures   [x] History of stroke   [] History of TIA  [] Aphasia   [] Temporary blindness    [] Dysphagia   [] Weakness or numbness in arms   [] Weakness or numbness in legs Musculoskeletal:  [] Arthritis   [] Joint swelling   [x] Joint pain   [] Low back pain Hematologic:  [] Easy bruising  [] Easy bleeding   [] Hypercoagulable state   [] Anemic  [] Hepatitis Gastrointestinal:  [] Blood in stool   [] Vomiting blood  [] Gastroesophageal reflux/heartburn   [] Abdominal pain Genitourinary:  [] Chronic kidney disease   [] Difficult urination  []   Frequent urination  [] Burning with urination   [] Hematuria Skin:  [] Rashes   [] Ulcers   [] Wounds Psychological:  [] History of anxiety   []  History of major depression.    Physical Exam BP (!) 157/87 (BP Location: Left Arm)   Pulse 82   Resp 16   Wt 235 lb (106.6 kg)   BMI 31.87 kg/m  Gen:  WD/WN, NAD Head: [place]/AT, No temporalis wasting.  Ear/Nose/Throat: Hearing grossly intact, nares w/o erythema or drainage, oropharynx w/o Erythema/Exudate Eyes: Conjunctiva clear, sclera non-icteric  Neck: trachea midline.  No JVD.  Pulmonary:  Good air movement, respirations not labored, no use of accessory muscles  Cardiac: RRR, no JVD Vascular:  Vessel Right Left  Radial Palpable Palpable                          PT 2+ 1+  DP 2+ 1+   Gastrointestinal:. No masses, surgical incisions, or scars. Musculoskeletal: M/S 5/5 throughout.  Extremities without ischemic changes.  No deformity or atrophy. No edema. Neurologic: Sensation grossly intact in extremities.  Symmetrical.  Speech is fluent. Motor exam as listed above. Psychiatric: Judgment intact, Mood & affect appropriate for pt's clinical situation. Dermatologic: No rashes or ulcers noted.  No cellulitis or open wounds.    Radiology No results found.  Labs No results found for this or any previous visit (from the past 2160 hour(s)).  Assessment/Plan:  Benign essential HTN blood pressure control important in reducing the progression of atherosclerotic disease. On appropriate oral  medications.   Stage 3a chronic kidney disease (HCC) Hydrate and limit contrast if ever has angiogram.  Follow with non-invasive studies.  Hyperlipidemia lipid control important in reducing the progression of atherosclerotic disease. Continue statin therapy      Festus Barren 10/16/2023, 3:47 PM   This note was created with Dragon medical transcription system.  Any errors from dictation are unintentional.

## 2023-10-16 NOTE — Assessment & Plan Note (Signed)
lipid control important in reducing the progression of atherosclerotic disease. Continue statin therapy  

## 2023-10-18 LAB — VAS US ABI WITH/WO TBI
Left ABI: 0.78
Right ABI: 0.93

## 2023-11-12 ENCOUNTER — Ambulatory Visit: Payer: 59 | Admitting: Gastroenterology

## 2023-11-12 ENCOUNTER — Telehealth: Payer: Self-pay

## 2023-11-12 NOTE — Telephone Encounter (Signed)
I have been calling the patient but the number on file is a invalid number.

## 2023-12-31 ENCOUNTER — Ambulatory Visit: Payer: 59 | Admitting: Urology

## 2023-12-31 ENCOUNTER — Encounter: Payer: Self-pay | Admitting: Urology

## 2024-01-22 ENCOUNTER — Encounter (INDEPENDENT_AMBULATORY_CARE_PROVIDER_SITE_OTHER): Payer: 59

## 2024-01-22 ENCOUNTER — Ambulatory Visit (INDEPENDENT_AMBULATORY_CARE_PROVIDER_SITE_OTHER): Payer: 59 | Admitting: Vascular Surgery

## 2024-03-17 ENCOUNTER — Ambulatory Visit: Payer: Self-pay | Admitting: Urology

## 2024-03-17 ENCOUNTER — Encounter: Payer: Self-pay | Admitting: Urology

## 2024-05-06 ENCOUNTER — Other Ambulatory Visit: Payer: Self-pay

## 2024-05-06 ENCOUNTER — Inpatient Hospital Stay
Admission: EM | Admit: 2024-05-06 | Discharge: 2024-05-14 | DRG: 377 | Disposition: A | Discharge status: Home Health | Attending: Student | Admitting: Student

## 2024-05-06 DIAGNOSIS — Z8673 Personal history of transient ischemic attack (TIA), and cerebral infarction without residual deficits: Secondary | ICD-10-CM | POA: Diagnosis not present

## 2024-05-06 DIAGNOSIS — K921 Melena: Secondary | ICD-10-CM | POA: Diagnosis present

## 2024-05-06 DIAGNOSIS — E559 Vitamin D deficiency, unspecified: Secondary | ICD-10-CM | POA: Diagnosis present

## 2024-05-06 DIAGNOSIS — I214 Non-ST elevation (NSTEMI) myocardial infarction: Secondary | ICD-10-CM | POA: Diagnosis not present

## 2024-05-06 DIAGNOSIS — F32A Depression, unspecified: Secondary | ICD-10-CM | POA: Diagnosis present

## 2024-05-06 DIAGNOSIS — Z6831 Body mass index (BMI) 31.0-31.9, adult: Secondary | ICD-10-CM

## 2024-05-06 DIAGNOSIS — I5031 Acute diastolic (congestive) heart failure: Secondary | ICD-10-CM | POA: Diagnosis not present

## 2024-05-06 DIAGNOSIS — E611 Iron deficiency: Secondary | ICD-10-CM | POA: Diagnosis present

## 2024-05-06 DIAGNOSIS — I350 Nonrheumatic aortic (valve) stenosis: Secondary | ICD-10-CM | POA: Diagnosis present

## 2024-05-06 DIAGNOSIS — E785 Hyperlipidemia, unspecified: Secondary | ICD-10-CM | POA: Diagnosis present

## 2024-05-06 DIAGNOSIS — J690 Pneumonitis due to inhalation of food and vomit: Secondary | ICD-10-CM | POA: Diagnosis not present

## 2024-05-06 DIAGNOSIS — K641 Second degree hemorrhoids: Secondary | ICD-10-CM | POA: Diagnosis present

## 2024-05-06 DIAGNOSIS — I5032 Chronic diastolic (congestive) heart failure: Secondary | ICD-10-CM | POA: Diagnosis present

## 2024-05-06 DIAGNOSIS — J449 Chronic obstructive pulmonary disease, unspecified: Secondary | ICD-10-CM | POA: Diagnosis present

## 2024-05-06 DIAGNOSIS — I252 Old myocardial infarction: Secondary | ICD-10-CM

## 2024-05-06 DIAGNOSIS — Z955 Presence of coronary angioplasty implant and graft: Secondary | ICD-10-CM

## 2024-05-06 DIAGNOSIS — E669 Obesity, unspecified: Secondary | ICD-10-CM | POA: Diagnosis present

## 2024-05-06 DIAGNOSIS — K219 Gastro-esophageal reflux disease without esophagitis: Secondary | ICD-10-CM | POA: Diagnosis present

## 2024-05-06 DIAGNOSIS — I110 Hypertensive heart disease with heart failure: Secondary | ICD-10-CM | POA: Diagnosis present

## 2024-05-06 DIAGNOSIS — Z951 Presence of aortocoronary bypass graft: Secondary | ICD-10-CM

## 2024-05-06 DIAGNOSIS — K922 Gastrointestinal hemorrhage, unspecified: Secondary | ICD-10-CM | POA: Diagnosis present

## 2024-05-06 DIAGNOSIS — J9611 Chronic respiratory failure with hypoxia: Secondary | ICD-10-CM | POA: Diagnosis present

## 2024-05-06 DIAGNOSIS — I2510 Atherosclerotic heart disease of native coronary artery without angina pectoris: Secondary | ICD-10-CM | POA: Diagnosis present

## 2024-05-06 DIAGNOSIS — D62 Acute posthemorrhagic anemia: Secondary | ICD-10-CM | POA: Diagnosis present

## 2024-05-06 DIAGNOSIS — Z87891 Personal history of nicotine dependence: Secondary | ICD-10-CM | POA: Diagnosis not present

## 2024-05-06 DIAGNOSIS — Z79899 Other long term (current) drug therapy: Secondary | ICD-10-CM

## 2024-05-06 DIAGNOSIS — Z604 Social exclusion and rejection: Secondary | ICD-10-CM | POA: Diagnosis present

## 2024-05-06 LAB — PROTIME-INR
INR: 1 (ref 0.8–1.2)
Prothrombin Time: 13.2 s (ref 11.4–15.2)

## 2024-05-06 LAB — COMPREHENSIVE METABOLIC PANEL WITH GFR
ALT: 27 U/L (ref 0–44)
AST: 34 U/L (ref 15–41)
Albumin: 3.1 g/dL — ABNORMAL LOW (ref 3.5–5.0)
Alkaline Phosphatase: 61 U/L (ref 38–126)
Anion gap: 11 (ref 5–15)
BUN: 16 mg/dL (ref 8–23)
CO2: 22 mmol/L (ref 22–32)
Calcium: 8.6 mg/dL — ABNORMAL LOW (ref 8.9–10.3)
Chloride: 102 mmol/L (ref 98–111)
Creatinine, Ser: 0.96 mg/dL (ref 0.61–1.24)
GFR, Estimated: >60 mL/min (ref >60)
Glucose, Bld: 115 mg/dL — ABNORMAL HIGH (ref 70–99)
Potassium: 3.8 mmol/L (ref 3.5–5.1)
Sodium: 135 mmol/L (ref 135–145)
Total Bilirubin: 1 mg/dL (ref 0.0–1.2)
Total Protein: 7.1 g/dL (ref 6.5–8.1)

## 2024-05-06 LAB — CBC WITH DIFFERENTIAL/PLATELET
Abs Immature Granulocytes: 0.07 10*3/uL (ref 0.00–0.07)
Basophils Absolute: 0.2 10*3/uL — ABNORMAL HIGH (ref 0.0–0.1)
Basophils Relative: 1 %
Eosinophils Absolute: 0.2 10*3/uL (ref 0.0–0.5)
Eosinophils Relative: 1 %
HCT: 27 % — ABNORMAL LOW (ref 39.0–52.0)
Hemoglobin: 8.3 g/dL — ABNORMAL LOW (ref 13.0–17.0)
Immature Granulocytes: 1 %
Lymphocytes Relative: 16 %
Lymphs Abs: 2.1 10*3/uL (ref 0.7–4.0)
MCH: 26.9 pg (ref 26.0–34.0)
MCHC: 30.7 g/dL (ref 30.0–36.0)
MCV: 87.7 fL (ref 80.0–100.0)
Monocytes Absolute: 1.3 10*3/uL — ABNORMAL HIGH (ref 0.1–1.0)
Monocytes Relative: 11 %
Neutro Abs: 8.9 10*3/uL — ABNORMAL HIGH (ref 1.7–7.7)
Neutrophils Relative %: 70 %
Platelets: 314 10*3/uL (ref 150–400)
RBC: 3.08 MIL/uL — ABNORMAL LOW (ref 4.22–5.81)
RDW: 20.3 % — ABNORMAL HIGH (ref 11.5–15.5)
WBC: 12.7 10*3/uL — ABNORMAL HIGH (ref 4.0–10.5)
nRBC: 0 % (ref 0.0–0.2)

## 2024-05-06 LAB — TROPONIN I (HIGH SENSITIVITY)
Troponin I (High Sensitivity): 15 ng/L (ref <18)
Troponin I (High Sensitivity): 15 ng/L (ref <18)

## 2024-05-06 LAB — APTT: aPTT: 31 s (ref 24–36)

## 2024-05-06 LAB — HEMOGLOBIN AND HEMATOCRIT, BLOOD
HCT: 22.9 % — ABNORMAL LOW (ref 39.0–52.0)
Hemoglobin: 7.1 g/dL — ABNORMAL LOW (ref 13.0–17.0)

## 2024-05-06 MED ORDER — FLUTICASONE PROPIONATE 50 MCG/ACT NA SUSP
2.0000 | Freq: Every day | NASAL | Status: DC
  Administered 2024-05-07 – 2024-05-14 (×8): 2 via NASAL
  Filled 2024-05-06 (×2): qty 16

## 2024-05-06 MED ORDER — ONDANSETRON HCL 4 MG/2ML IJ SOLN
4.0000 mg | Freq: Four times a day (QID) | INTRAMUSCULAR | Status: DC | PRN
  Administered 2024-05-10: 4 mg via INTRAVENOUS
  Filled 2024-05-06: qty 2

## 2024-05-06 MED ORDER — ONDANSETRON HCL 4 MG PO TABS: 4.0000 mg | ORAL_TABLET | Freq: Four times a day (QID) | ORAL | Status: DC | PRN

## 2024-05-06 MED ORDER — MELATONIN 5 MG PO TABS
10.0000 mg | ORAL_TABLET | Freq: Every day | ORAL | Status: DC
  Administered 2024-05-06 – 2024-05-13 (×7): 10 mg via ORAL
  Filled 2024-05-06 (×7): qty 2

## 2024-05-06 MED ORDER — ESCITALOPRAM OXALATE 10 MG PO TABS
5.0000 mg | ORAL_TABLET | Freq: Every day | ORAL | Status: DC
  Administered 2024-05-06 – 2024-05-14 (×9): 5 mg via ORAL
  Filled 2024-05-06: qty 1
  Filled 2024-05-06 (×2): qty 0.5
  Filled 2024-05-06 (×2): qty 1
  Filled 2024-05-06 (×2): qty 0.5
  Filled 2024-05-06: qty 1
  Filled 2024-05-06: qty 0.5

## 2024-05-06 MED ORDER — DONEPEZIL HCL 5 MG PO TABS
10.0000 mg | ORAL_TABLET | Freq: Every day | ORAL | Status: DC
  Administered 2024-05-06 – 2024-05-13 (×7): 10 mg via ORAL
  Filled 2024-05-06 (×7): qty 2

## 2024-05-06 MED ORDER — ACETAMINOPHEN 325 MG PO TABS
650.0000 mg | ORAL_TABLET | Freq: Four times a day (QID) | ORAL | Status: DC | PRN
  Administered 2024-05-07 – 2024-05-08 (×2): 650 mg via ORAL
  Filled 2024-05-06 (×3): qty 2

## 2024-05-06 MED ORDER — LORATADINE 10 MG PO TABS
10.0000 mg | ORAL_TABLET | Freq: Every day | ORAL | Status: DC
  Administered 2024-05-07 – 2024-05-14 (×8): 10 mg via ORAL
  Filled 2024-05-06 (×8): qty 1

## 2024-05-06 MED ORDER — NALTREXONE HCL 50 MG PO TABS
50.0000 mg | ORAL_TABLET | Freq: Every day | ORAL | Status: DC
  Administered 2024-05-06 – 2024-05-14 (×9): 50 mg via ORAL
  Filled 2024-05-06 (×9): qty 1

## 2024-05-06 MED ORDER — FAMOTIDINE IN NACL 20-0.9 MG/50ML-% IV SOLN
20.0000 mg | Freq: Once | INTRAVENOUS | Status: AC
  Administered 2024-05-06: 20 mg via INTRAVENOUS
  Filled 2024-05-06: qty 50

## 2024-05-06 MED ORDER — ACETAMINOPHEN 650 MG RE SUPP: 650.0000 mg | Freq: Four times a day (QID) | RECTAL | Status: DC | PRN

## 2024-05-06 MED ORDER — TRAZODONE HCL 50 MG PO TABS: 25.0000 mg | ORAL_TABLET | Freq: Every evening | ORAL | Status: DC | PRN

## 2024-05-06 MED ORDER — BUDESON-GLYCOPYRROL-FORMOTEROL 160-9-4.8 MCG/ACT IN AERO: 2.0000 | INHALATION_SPRAY | Freq: Two times a day (BID) | RESPIRATORY_TRACT | Status: DC

## 2024-05-06 MED ORDER — PANTOPRAZOLE SODIUM 40 MG IV SOLR
40.0000 mg | Freq: Two times a day (BID) | INTRAVENOUS | Status: DC
  Administered 2024-05-06 – 2024-05-13 (×14): 40 mg via INTRAVENOUS
  Filled 2024-05-06 (×14): qty 10

## 2024-05-06 MED ORDER — PRAVASTATIN SODIUM 20 MG PO TABS
20.0000 mg | ORAL_TABLET | Freq: Every day | ORAL | Status: DC
  Administered 2024-05-06 – 2024-05-13 (×7): 20 mg via ORAL
  Filled 2024-05-06 (×7): qty 1

## 2024-05-06 MED ORDER — SODIUM CHLORIDE 0.9 % IV SOLN: INTRAVENOUS | Status: AC

## 2024-05-06 MED ORDER — ENOXAPARIN SODIUM 40 MG/0.4ML IJ SOSY: 40.0000 mg | PREFILLED_SYRINGE | INTRAMUSCULAR | Status: DC

## 2024-05-06 MED ORDER — ENOXAPARIN SODIUM 60 MG/0.6ML IJ SOSY
50.0000 mg | PREFILLED_SYRINGE | INTRAMUSCULAR | Status: DC
  Administered 2024-05-06: 50 mg via SUBCUTANEOUS
  Filled 2024-05-06: qty 0.6

## 2024-05-06 MED ORDER — VITAMIN B-12 1000 MCG PO TABS
1000.0000 ug | ORAL_TABLET | Freq: Every day | ORAL | Status: DC
  Administered 2024-05-06 – 2024-05-07 (×2): 1000 ug via ORAL
  Filled 2024-05-06 (×2): qty 1
  Filled 2024-05-06: qty 2

## 2024-05-06 NOTE — ED Notes (Signed)
Pt given warm blanket and water.

## 2024-05-06 NOTE — Group Note (Deleted)
 Date:  05/06/2024 Time:  9:45 PM  Group Topic/Focus:  Wrap-Up Group:   The focus of this group is to help patients review their daily goal of treatment and discuss progress on daily workbooks.     Participation Level:  {BHH PARTICIPATION EAVWU:98119}  Participation Quality:  {BHH PARTICIPATION QUALITY:22265}  Affect:  {BHH AFFECT:22266}  Cognitive:  {BHH COGNITIVE:22267}  Insight: {BHH Insight2:20797}  Engagement in Group:  {BHH ENGAGEMENT IN JYNWG:95621}  Modes of Intervention:  {BHH MODES OF INTERVENTION:22269}  Additional Comments:  ***  Maglione,Preciosa Bundrick E 05/06/2024, 9:45 PM

## 2024-05-06 NOTE — Progress Notes (Signed)
 PHARMACIST - PHYSICIAN COMMUNICATION  CONCERNING:  Enoxaparin  (Lovenox ) for DVT Prophylaxis    RECOMMENDATION: Patient was prescribed enoxaprin 40mg  q24 hours for VTE prophylaxis.   Filed Weights   05/06/24 1758  Weight: 104.3 kg (230 lb)    Body mass index is 31.19 kg/m.  Estimated Creatinine Clearance: 95.8 mL/min (by C-G formula based on SCr of 0.96 mg/dL).   Based on [facility] policy patient is candidate for enoxaparin  0.5mg /kg TBW SQ every 24 hours based on BMI being >30.   DESCRIPTION: Pharmacy has adjusted enoxaparin  dose per [facility] policy.  Patient is now receiving enoxaparin  0.5 mg/kg every 24 hours    Adalberto Acton, PharmD Clinical Pharmacist  05/06/2024 8:52 PM

## 2024-05-06 NOTE — ED Notes (Signed)
 Pt given meal tray.

## 2024-05-06 NOTE — ED Triage Notes (Signed)
 Pt reports blood in stool x2 weeks. Pt reports his stool is getting darker. He has hx of same. Pt reports dizziness when standing x1 month. Pt reports several falls over the past month. Pt also endorses syncopal episodes. Pt reports he stopped drinking alcohol  a month and a half ago.

## 2024-05-06 NOTE — ED Provider Notes (Signed)
 [facility] Provider Note   Event Date/Time   First MD Initiated Contact with Patient 05/06/24 1834     (approximate) History  Dizziness and Blood In Stools  HPI [name] is a 63 y.o. male with a past medical history of CVA, CAD, hypertension, COPD, and alcohol  abuse who presents complaining of worsening midepigastric abdominal pain with slowly darkening stools.  Patient states that he has had similar symptoms in the past when he was told he had a GI bleed.  Patient denies any continued alcohol  abuse.  Patient denies any history of varices. ROS: Patient currently denies any vision changes, tinnitus, difficulty speaking, facial droop, sore throat, chest pain, shortness of breath,  nausea/vomiting/diarrhea, dysuria, or weakness/numbness/paresthesias in any extremity   Physical Exam  Triage Vital Signs: ED Triage Vitals  Encounter Vitals Group     BP 05/06/24 1756 92/77     Systolic BP Percentile --      Diastolic BP Percentile --      Pulse Rate 05/06/24 1756 (!) 109     Resp 05/06/24 1756 (!) 22     Temp 05/06/24 1756 98.5 F (36.9 C)     Temp Source 05/06/24 1756 Oral     SpO2 05/06/24 1756 100 %     Weight 05/06/24 1758 230 lb (104.3 kg)     Height 05/06/24 1758 6' (1.829 m)     Head Circumference --      Peak Flow --      Pain Score 05/06/24 1758 0     Pain Loc --      Pain Education --      Exclude from Growth Chart --    Most recent vital signs: Vitals:   05/06/24 1900 05/06/24 2030  BP: 131/71 123/73  Pulse: 95 88  Resp: 16 13  Temp:    SpO2: 100% 100%   General: Awake, oriented x4. CV:  Good peripheral perfusion.  Resp:  Normal effort.  Abd:  No distention.  Rectal:  Dried dark tarry stool around rectum that is Hemoccult positive Other:  Disheveled elderly obese Caucasian male resting comfortably in no acute distress ED Results / Procedures / Treatments  Labs (all labs ordered are listed, but only abnormal results are  displayed) Labs Reviewed  CBC WITH DIFFERENTIAL/PLATELET - Abnormal; Notable for the following components:      Result Value   WBC 12.7 (*)    RBC 3.08 (*)    Hemoglobin 8.3 (*)    HCT 27.0 (*)    RDW 20.3 (*)    Neutro Abs 8.9 (*)    Monocytes Absolute 1.3 (*)    Basophils Absolute 0.2 (*)    All other components within normal limits  COMPREHENSIVE METABOLIC PANEL WITH GFR - Abnormal; Notable for the following components:   Glucose, Bld 115 (*)    Calcium  8.6 (*)    Albumin 3.1 (*)    All other components within normal limits  APTT  PROTIME-INR  BASIC METABOLIC PANEL WITH GFR  CBC  HEMOGLOBIN AND HEMATOCRIT, BLOOD  TYPE AND SCREEN  TROPONIN I (HIGH SENSITIVITY)  TROPONIN I (HIGH SENSITIVITY)   EKG ED ECG REPORT I, Charleen Conn, the attending physician, personally viewed and interpreted this ECG. Date: 05/06/2024 EKG Time: 1809 Rate: 108 Rhythm: Tachycardic sinus rhythm QRS Axis: normal Intervals: normal ST/T Wave abnormalities: normal Narrative Interpretation: Tachycardic sinus rhythm.  No evidence of acute ischemia PROCEDURES: Critical Care performed: No Procedures MEDICATIONS ORDERED IN ED:  Medications  pantoprazole  (PROTONIX ) injection 40 mg (has no administration in time range)  pravastatin  (PRAVACHOL ) tablet 20 mg (has no administration in time range)  donepezil  (ARICEPT ) tablet 10 mg (has no administration in time range)  escitalopram  (LEXAPRO ) tablet 5 mg (has no administration in time range)  cyanocobalamin  (VITAMIN B12) tablet 1,000 mcg (has no administration in time range)  melatonin tablet 10 mg (has no administration in time range)  naltrexone  (DEPADE) tablet 50 mg (has no administration in time range)  loratadine  (CLARITIN ) tablet 10 mg (has no administration in time range)  fluticasone  (FLONASE ) 50 MCG/ACT nasal spray 2 spray (has no administration in time range)  budesonide -glycopyrrolate -formoterol (BREZTRI) 160-9-4.8 MCG/ACT inhaler 2 puff (has  no administration in time range)  0.9 %  sodium chloride  infusion (has no administration in time range)  acetaminophen  (TYLENOL ) tablet 650 mg (has no administration in time range)    Or  acetaminophen  (TYLENOL ) suppository 650 mg (has no administration in time range)  traZODone  (DESYREL ) tablet 25 mg (has no administration in time range)  ondansetron  (ZOFRAN ) tablet 4 mg (has no administration in time range)    Or  ondansetron  (ZOFRAN ) injection 4 mg (has no administration in time range)  enoxaparin  (LOVENOX ) injection 50 mg (has no administration in time range)  famotidine  (PEPCID ) IVPB 20 mg premix (0 mg Intravenous Stopped 05/06/24 1954)   IMPRESSION / MDM / ASSESSMENT AND PLAN / ED COURSE  I reviewed the triage vital signs and the nursing notes.                             The patient is on the cardiac monitor to evaluate for evidence of arrhythmia and/or significant heart rate changes. Patient's presentation is most consistent with acute presentation with potential threat to life or bodily function. + abdominal pain + black stool per rectum Given history and exam patient's presentation most consistent with upper GI bleed possibly secondary to peptic ulcer disease or variceal bleeding. I have low suspicion for aortoenteric fistula, ENT bleeding mimic, Boerhaave's, Pulmonary bleeding mimic.  Workup: CBC, BMP, LFTs, Lipase, PT/INR, Type and Screen  Interventions: Analgesia and antiemetic medications PRN Protonix  40mg  IVP  Findings: Hb: 8.3  Disposition: Admit for close monitoring.   FINAL CLINICAL IMPRESSION(S) / ED DIAGNOSES   Final diagnoses:  Upper GI bleeding   Rx / DC Orders   ED Discharge Orders     None      Note:  This document was prepared using Dragon voice recognition software and may include unintentional dictation errors.   Orel Hord K, MD 05/06/24 2206

## 2024-05-06 NOTE — H&P (Signed)
 [place]   PATIENT NAME: [name]    MR#:  [id number]  DATE OF BIRTH:  August 01, 1961  DATE OF ADMISSION:  05/06/2024  PRIMARY CARE PHYSICIAN: Evette Hoes, NP   Patient is coming from: Home   REQUESTING/REFERRING PHYSICIAN: Iven Mark, MD  CHIEF COMPLAINT:   Chief Complaint  Patient presents with   Dizziness   Blood In Stools    HISTORY OF PRESENT ILLNESS:  [name] is a 63 y.o. Caucasian male with medical history significant for COPD, CVA, hypertension, coronary artery disease, who presented to the emergency room with a concern of melena over the last few weeks.  The patient denied any nausea or vomiting but has been having mild periumbilical abdominal pain.  He denied any bright red bleeding per rectum.  No presyncope or syncope.  He admits to mild dyspnea without cough or wheezing.  No chest pain or palpitations.  No dysuria, oliguria or hematuria or flank pain.  No other bleeding diathesis.  ED Course: When he came to the ER, BP was 92/77 with heart rate of 109 and respiratory to 22 with otherwise normal vital signs.  Labs revealed calcium  8.6 and albumin 3.1 with otherwise unremarkable CMP.  High-sensitivity troponin was 15 twice.  CBC showed mild leukocytosis of 12.7 with neutrophilia and anemia with hemoglobin 8.3 hematocrit 27 close to previous levels.  Blood glucose A+ with negative antibody screen. EKG as reviewed by me : EKG showed sinus tachycardia with a rate of 108 with biatrial enlargement. Imaging: None.  The patient was 20 mg of IV Pepcid .  He will be admitted to a medical telemetry bed for further evaluation and management. PAST MEDICAL HISTORY:   Past Medical History:  Diagnosis Date   COPD (chronic obstructive pulmonary disease) (HCC)    CVA (cerebral infarction)    Headache    mild, since stroke 2012   Hypertension    MI (myocardial infarction) (HCC) 05/09/2012   Reading difficulty    pt reports he reads at "about a second grade  level"   Stroke [facility]) 2012   numbness to left hand    Wears dentures    full upper and lower    PAST SURGICAL HISTORY:   Past Surgical History:  Procedure Laterality Date   CAROTID ENDARTERECTOMY Left    2012 or 2013   CHOLECYSTECTOMY N/A 07/26/2016   Procedure: LAPAROSCOPIC CHOLECYSTECTOMY;  Surgeon: Claudia Cuff, MD;  Location: [facility] ORS;  Service: General;  Laterality: N/A;   COLONOSCOPY WITH PROPOFOL  N/A 02/09/2022   Procedure: COLONOSCOPY WITH PROPOFOL ;  Surgeon: Marnee Sink, MD;  Location: [facility] ENDOSCOPY;  Service: Endoscopy;  Laterality: N/A;   CORONARY ARTERY BYPASS GRAFT  05/10/2012   3 vessel   ENTEROSCOPY N/A 05/16/2023   Procedure: ENTEROSCOPY;  Surgeon: Luke Salaam, MD;  Location: [facility] ENDOSCOPY;  Service: Gastroenterology;  Laterality: N/A;   ENTEROSCOPY N/A 06/01/2023   Procedure: ENTEROSCOPY;  Surgeon: Selena Daily, MD;  Location: [facility] ENDOSCOPY;  Service: Gastroenterology;  Laterality: N/A;   ESOPHAGOGASTRODUODENOSCOPY  05/13/2023   Procedure: ESOPHAGOGASTRODUODENOSCOPY (EGD);  Surgeon: Marnee Sink, MD;  Location[facility] END[facility]COPY;  Service: Endoscopy;;   ESOPHAGOGASTRODUODENOSCOPY (EGD) WITH PROPOFOL  N/A 10/28/2022   Procedure: ESOPHAGOGASTRODUODENOSCOPY (EGD) WITH PROPOFOL ;  Surgeon: Quintin Buckle, DO;  Location: [facility] ENDOSCOPY;  Service: Gastroenterology;  Laterality: N/A;   GIVENS CAPSULE STUDY  05/13/2023   Procedure: GIVENS CAPSULE STUDY;  Surgeon: Marnee Sink, MD;  Location: [facility] ENDOSCOPY;  Service: Endoscopy;;   GIVENS CAPSULE  STUDY  06/01/2023   Procedure: GIVENS CAPSULE STUDY;  Surgeon: Selena Daily, MD;  Location: [facility] ENDOSCOPY;  Service: Gastroenterology;;    SOCIAL HISTORY:   Social History   Tobacco Use   Smoking status: Former    Current packs/day: 0.00    Types: Cigarettes    Quit date: 1993    Years since quitting: 32.4   Smokeless tobacco: Current    Types: Snuff   Tobacco comments:    changed to dip  Substance Use  Topics   Alcohol  use: Yes    Alcohol /week: 26.0 standard drinks of alcohol     Types: 26 Cans of beer per week    FAMILY HISTORY:   Family History  Problem Relation Age of Onset   Pneumonia Mother     DRUG ALLERGIES:   Allergies  Allergen Reactions   Other     Welbutrin - has paranoia     REVIEW OF SYSTEMS:   ROS As per history of present illness. All pertinent systems were reviewed above. Constitutional, HEENT, cardiovascular, respiratory, GI, GU, musculoskeletal, neuro, psychiatric, endocrine, integumentary and hematologic systems were reviewed and are otherwise negative/unremarkable except for positive findings mentioned above in the HPI.   MEDICATIONS AT HOME:   Prior to Admission medications   Medication Sig Start Date End Date Taking? Authorizing Provider  albuterol  (VENTOLIN  HFA) 108 (90 Base) MCG/ACT inhaler Inhale 2 puffs into the lungs every 6 (six) hours as needed for wheezing or shortness of breath. Patient not taking: Reported on 10/16/2023 03/13/23   Suellyn Emory, MD  aspirin  EC (ASPIRIN  LOW DOSE) 81 MG tablet Take 81 mg by mouth daily. Patient not taking: Reported on 05/06/2024 04/27/23   [provider]  cetirizine (ZYRTEC) 10 MG tablet Take 10 mg by mouth daily. Patient not taking: Reported on 05/06/2024 08/04/22   [provider]  donepezil  (ARICEPT ) 10 MG tablet Take 10 mg by mouth daily.  Patient not taking: Reported on 05/06/2024    [provider]  escitalopram  (LEXAPRO ) 5 MG tablet Take 5 mg by mouth daily. Patient not taking: Reported on 05/06/2024 04/19/23   [provider]  fluticasone  (FLONASE ) 50 MCG/ACT nasal spray Place 2 sprays into both nostrils daily. Patient not taking: Reported on 05/06/2024 08/04/22   [provider]  Melatonin 10 MG CAPS Take 1 capsule by mouth at bedtime. Patient not taking: Reported on 05/06/2024 08/04/22   [provider]  naltrexone  (DEPADE) 50 MG tablet Take 50 mg by  mouth daily. Patient not taking: Reported on 05/06/2024 04/16/23   [provider]  omeprazole  (PRILOSEC) 20 MG capsule Take 1 capsule (20 mg total) by mouth 2 (two) times daily before a meal. Patient not taking: Reported on 05/06/2024 05/18/23   Margery Sheets B, MD  polyethylene glycol (MIRALAX ) 17 g packet Take 17 g by mouth daily. Patient not taking: Reported on 05/06/2024 06/29/23   Lind Repine, MD  pravastatin  (PRAVACHOL ) 20 MG tablet Take 20 mg by mouth at bedtime.  Patient not taking: Reported on 05/06/2024    [provider]  sodium phosphate (FLEET) 7-19 GM/118ML ENEM Place 133 mLs (1 enema total) rectally once as needed for up to 1 dose for severe constipation. Patient not taking: Reported on 05/06/2024 06/29/23   Lind Repine, MD  TRELEGY ELLIPTA 100-62.5-25 MCG/ACT AEPB Inhale 1 puff into the lungs daily. Patient not taking: Reported on 05/06/2024 09/27/23   [provider]  vitamin B-12 (CYANOCOBALAMIN ) 1000 MCG tablet Take 1 tablet (1,000  mcg total) by mouth daily. Patient not taking: Reported on 05/06/2024 06/05/21   Kraig Peru, MD      VITAL SIGNS:  Blood pressure 120/76, pulse (!) 101, temperature 98 F (36.7 C), resp. rate 20, height 6' (1.829 m), weight 104.3 kg, SpO2 100%.  PHYSICAL EXAMINATION:  Physical Exam  GENERAL:  63 y.o.-year-old Caucasian male patient lying in the bed with no acute distress.  EYES: Pupils equal, round, reactive to light and accommodation.  Mild pallor.  No scleral icterus. Extraocular muscles intact.  HEENT: Head atraumatic, normocephalic. Oropharynx and nasopharynx clear.  NECK:  Supple, no jugular venous distention. No thyroid enlargement, no tenderness.  LUNGS: Normal breath sounds bilaterally, no wheezing, rales,rhonchi or crepitation. No use of accessory muscles of respiration.  CARDIOVASCULAR: Regular rate and rhythm, S1, S2 normal. No murmurs, rubs, or gallops.  ABDOMEN: Soft, nondistended,  nontender. Bowel sounds present. No organomegaly or mass.  EXTREMITIES: No pedal edema, cyanosis, or clubbing.  NEUROLOGIC: Cranial nerves II through XII are intact. Muscle strength 5/5 in all extremities. Sensation intact. Gait not checked.  PSYCHIATRIC: The patient is alert and oriented x 3.  Normal affect and good eye contact. SKIN: No obvious rash, lesion, or ulcer.   LABORATORY PANEL:   CBC Recent Labs  Lab 05/06/24 1812 05/06/24 2226  WBC 12.7*  --   HGB 8.3* 7.1*  HCT 27.0* 22.9*  PLT 314  --    ------------------------------------------------------------------------------------------------------------------  Chemistries  Recent Labs  Lab 05/06/24 1812  NA 135  K 3.8  CL 102  CO2 22  GLUCOSE 115*  BUN 16  CREATININE 0.96  CALCIUM  8.6*  AST 34  ALT 27  ALKPHOS 61  BILITOT 1.0   ------------------------------------------------------------------------------------------------------------------  Cardiac Enzymes No results for input(s): "TROPONINI" in the last 168 hours. ------------------------------------------------------------------------------------------------------------------  RADIOLOGY:  No results found.    IMPRESSION AND PLAN:  Assessment and Plan: * GI bleeding - This is likely of upper GI etiology with melena. - The patient will be admitted to a medical telemetry bed. - Will follow serial hemoglobins and hematocrits. - Will place the patient on IV Protonix . - Will hold off aspirin . - GI consult will be obtained. - I notified Dr. Emerick Hanlon about the patient.  Chronic obstructive pulmonary disease (COPD) (HCC) - Will continue his inhalers.  Depression - Continue Lexapro .  Dyslipidemia - Will continue statin therapy.   DVT prophylaxis: SCDs Advanced Care Planning:  Code Status: full code. Family Communication:  The plan of care was discussed in details with the patient (and family). I answered all questions. The patient agreed to  proceed with the above mentioned plan. Further management will depend upon hospital course. Disposition Plan: Back to previous home environment Consults called: GI All the records are reviewed and case discussed with ED provider.  Status is: Inpatient  At the time of the admission, it appears that the appropriate admission status for this patient is inpatient.  This is judged to be reasonable and necessary in order to provide the required intensity of service to ensure the patient's safety given the presenting symptoms, physical exam findings and initial radiographic and laboratory data in the context of comorbid conditions.  The patient requires inpatient status due to high intensity of service, high risk of further deterioration and high frequency of surveillance required.  I certify that at the time of admission, it is my clinical judgment that the patient will require inpatient hospital care extending more than 2 midnights.  Dispo: The patient is from: Home              Anticipated d/c is to: Home              Patient currently is not medically stable to d/c.              Difficult to place patient: No  Virgene Griffin M.D on 05/07/2024 at 1:33 AM  [facility]   From 7 PM-7 AM, contact night-coverage [email or web address]  CC: Primary care physician; Evette Hoes, NP

## 2024-05-07 DIAGNOSIS — K921 Melena: Secondary | ICD-10-CM | POA: Diagnosis not present

## 2024-05-07 DIAGNOSIS — J449 Chronic obstructive pulmonary disease, unspecified: Secondary | ICD-10-CM | POA: Insufficient documentation

## 2024-05-07 DIAGNOSIS — F32A Depression, unspecified: Secondary | ICD-10-CM | POA: Diagnosis present

## 2024-05-07 LAB — CBC
HCT: 21.3 % — ABNORMAL LOW (ref 39.0–52.0)
Hemoglobin: 6.7 g/dL — ABNORMAL LOW (ref 13.0–17.0)
MCH: 27.3 pg (ref 26.0–34.0)
MCHC: 31.5 g/dL (ref 30.0–36.0)
MCV: 86.9 fL (ref 80.0–100.0)
Platelets: 207 10*3/uL (ref 150–400)
RBC: 2.45 MIL/uL — ABNORMAL LOW (ref 4.22–5.81)
RDW: 20.3 % — ABNORMAL HIGH (ref 11.5–15.5)
WBC: 9.4 10*3/uL (ref 4.0–10.5)
nRBC: 0 % (ref 0.0–0.2)

## 2024-05-07 LAB — VITAMIN B12: Vitamin B-12: 937 pg/mL — ABNORMAL HIGH (ref 180–914)

## 2024-05-07 LAB — HEMOGLOBIN AND HEMATOCRIT, BLOOD
HCT: 21.5 % — ABNORMAL LOW (ref 39.0–52.0)
HCT: 25.8 % — ABNORMAL LOW (ref 39.0–52.0)
HCT: 26.5 % — ABNORMAL LOW (ref 39.0–52.0)
Hemoglobin: 6.8 g/dL — ABNORMAL LOW (ref 13.0–17.0)
Hemoglobin: 8.2 g/dL — ABNORMAL LOW (ref 13.0–17.0)
Hemoglobin: 8.5 g/dL — ABNORMAL LOW (ref 13.0–17.0)

## 2024-05-07 LAB — PREPARE RBC (CROSSMATCH)

## 2024-05-07 LAB — BASIC METABOLIC PANEL WITH GFR
Anion gap: 8 (ref 5–15)
BUN: 17 mg/dL (ref 8–23)
CO2: 26 mmol/L (ref 22–32)
Calcium: 8.3 mg/dL — ABNORMAL LOW (ref 8.9–10.3)
Chloride: 105 mmol/L (ref 98–111)
Creatinine, Ser: 0.93 mg/dL (ref 0.61–1.24)
GFR, Estimated: >60 mL/min (ref >60)
Glucose, Bld: 98 mg/dL (ref 70–99)
Potassium: 3.9 mmol/L (ref 3.5–5.1)
Sodium: 139 mmol/L (ref 135–145)

## 2024-05-07 LAB — IRON AND TIBC
Iron: 42 ug/dL — ABNORMAL LOW (ref 45–182)
Saturation Ratios: 13 % — ABNORMAL LOW (ref 17.9–39.5)
TIBC: 330 ug/dL (ref 250–450)
UIBC: 288 ug/dL

## 2024-05-07 LAB — GLUCOSE, CAPILLARY: Glucose-Capillary: 122 mg/dL — ABNORMAL HIGH (ref 70–99)

## 2024-05-07 LAB — FOLATE: Folate: 7 ng/mL (ref >5.9)

## 2024-05-07 LAB — VITAMIN D 25 HYDROXY (VIT D DEFICIENCY, FRACTURES): Vit D, 25-Hydroxy: 12.02 ng/mL — ABNORMAL LOW (ref 30–100)

## 2024-05-07 MED ORDER — PEG 3350-KCL-NA BICARB-NACL 420 G PO SOLR
4000.0000 mL | Freq: Once | ORAL | Status: AC
  Administered 2024-05-07: 4000 mL via ORAL
  Filled 2024-05-07: qty 4000

## 2024-05-07 MED ORDER — IRON SUCROSE 300 MG IVPB - SIMPLE MED
300.0000 mg | Freq: Once | Status: AC
  Administered 2024-05-07: 300 mg via INTRAVENOUS
  Filled 2024-05-07: qty 300

## 2024-05-07 MED ORDER — SODIUM CHLORIDE 0.9% IV SOLUTION: Freq: Once | INTRAVENOUS | Status: AC

## 2024-05-07 MED ORDER — VITAMIN C 500 MG PO TABS
500.0000 mg | ORAL_TABLET | Freq: Every day | ORAL | Status: DC
  Administered 2024-05-09 – 2024-05-14 (×6): 500 mg via ORAL
  Filled 2024-05-07 (×6): qty 1

## 2024-05-07 MED ORDER — POLYSACCHARIDE IRON COMPLEX 150 MG PO CAPS
150.0000 mg | ORAL_CAPSULE | Freq: Every day | ORAL | Status: DC
  Administered 2024-05-09 – 2024-05-14 (×6): 150 mg via ORAL
  Filled 2024-05-07 (×6): qty 1

## 2024-05-07 MED ORDER — FOLIC ACID 1 MG PO TABS
1.0000 mg | ORAL_TABLET | Freq: Every day | ORAL | Status: DC
  Administered 2024-05-07 – 2024-05-14 (×8): 1 mg via ORAL
  Filled 2024-05-07 (×8): qty 1

## 2024-05-07 NOTE — Plan of Care (Signed)

## 2024-05-07 NOTE — Assessment & Plan Note (Signed)
-   Will continue his inhalers.

## 2024-05-07 NOTE — Assessment & Plan Note (Signed)
-  We will continue his inhalers.

## 2024-05-07 NOTE — Progress Notes (Signed)
 Pt alert and oriented; Hgb 6.7; pt had dry bloody underwear from ED; no recent BM; continue to monitor.

## 2024-05-07 NOTE — Progress Notes (Signed)
 MD notified of hgb 6.7.

## 2024-05-07 NOTE — Assessment & Plan Note (Addendum)
-   This is likely of upper GI etiology with melena. - The patient will be admitted to a medical telemetry bed. - Will follow serial hemoglobins and hematocrits. - Will place the patient on IV Protonix . - Will hold off aspirin . - GI consult will be obtained. - I notified Dr. Emerick Hanlon about the patient.

## 2024-05-07 NOTE — Progress Notes (Signed)
 [facility] Progress Note  Patient: [name]    ZOX:[id number]  DOA: 05/06/2024     Date of Service: the patient was seen and examined on 05/07/2024  Chief Complaint  Patient presents with   Dizziness   Blood In Stools   Brief hospital course: [name] is a 63 y.o. Caucasian male with medical history significant for COPD, CVA, hypertension, coronary artery disease, who presented to the emergency room with a concern of melena over the last few weeks.  No bright red bleeding, no nausea vomiting, no abdominal pain.   ED Course: VS BP 92/77 HR 109 and RR 22  Labs: calcium  8.6 and albumin 3.1 with otherwise unremarkable CMP.  High-sensitivity troponin was 15 twice.   CBC: wbc 12.7 with neutrophilia and anemia with Hb 8.3  Blood glucose A+ with negative antibody screen. EKG as reviewed by me : EKG showed sinus tachycardia with a rate of 108 with biatrial enlargement. Imaging: None.   The patient was 20 mg of IV Pepcid .  He will be admitted to a medical telemetry bed for further evaluation and management.   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  At that time it was recommended to approach the lesions via lower GI tract to find and cauterize. Continue PPI for now GI consulted, recommended clear liquid diet, keep n.p.o. after midnight for colonoscopy tomorrow a.m.   # Acute blood loss anemia due to GI bleeding as above 5/21, Hb 6.7, 1 unit of PRBC transfused Monitor H&H and transfuse as needed   Iron  deficiency, transferrin saturation 13% 5/21 Venofer 3 mg IV one-time dose given followed by oral supplement Folate level 7.0 at lower end, started folic acid  1 mg p.o. daily   Chronic obstructive pulmonary disease (COPD) - Continue his inhalers.   Depression: Continue Lexapro .   Dyslipidemia: continue statin therapy.   Body mass  index is 31.19 kg/m.  Interventions:   Diet: CLD DVT Prophylaxis: SCD, pharmacological prophylaxis contraindicated due to GI bleeding   Advance goals of care discussion: Full code  Family Communication: family was not present at bedside, at the time of interview.  The pt provided permission to discuss medical plan with the family. Opportunity was given to ask question and all questions were answered satisfactorily.   Disposition:  Pt is from home, admitted with GI bleeding, GI consulted, pending colonoscopy, which precludes a safe discharge. Discharge to home, when stable and cleared by GI most likely in 1 to 2 days.  Subjective: No significant events overnight, patient is having bleeding for past 2 weeks, and also had pain in the middle of the chest and bilateral shoulders.  Currently patient is chest pain-free, denied any symptoms.  Physical Exam: General: NAD, lying comfortably Appear in no distress, affect appropriate Eyes: PERRLA ENT: Oral Mucosa Clear, moist  Neck: no JVD,  Cardiovascular: S1 and S2 Present, no Murmur,  Respiratory: good respiratory effort, Bilateral Air entry equal and Decreased, no Crackles, no wheezes Abdomen: Bowel Sound present, Soft and no tenderness,  Skin: no rashes Extremities: no Pedal edema, no calf tenderness Neurologic: without any new focal findings Gait not checked due to patient safety concerns  Vitals:   05/06/24 1900 05/06/24 2030 05/07/24 0031 05/07/24 0736  BP: 131/71 123/73 120/76 119/62  Pulse: 95 88 (!) 101 85  Resp: 16 13 20 16   Temp:   98 F (  36.7 C) 97.9 F (36.6 C)  TempSrc:   Oral Oral  SpO2: 100% 100% 100% 100%  Weight:      Height:       No intake or output data in the 24 hours ending 05/07/24 1313 Filed Weights   05/06/24 1758  Weight: 104.3 kg    Data Reviewed: I have personally reviewed and interpreted daily labs, tele strips, imagings as discussed above. I reviewed all nursing notes, pharmacy notes, vitals,  pertinent old records I have discussed plan of care as described above with RN and patient/family.  CBC: Recent Labs  Lab 05/06/24 1812 05/06/24 2226 05/07/24 0456 05/07/24 1027  WBC 12.7*  --  9.4  --   NEUTROABS 8.9*  --   --   --   HGB 8.3* 7.1* 6.7* 6.8*  HCT 27.0* 22.9* 21.3* 21.5*  MCV 87.7  --  86.9  --   PLT 314  --  207  --    Basic Metabolic Panel: Recent Labs  Lab 05/06/24 1812 05/07/24 0456  NA 135 139  K 3.8 3.9  CL 102 105  CO2 22 26  GLUCOSE 115* 98  BUN 16 17  CREATININE 0.96 0.93  CALCIUM  8.6* 8.3*    Studies: No results found.  Scheduled Meds:  sodium chloride    Intravenous Once   cyanocobalamin   1,000 mcg Oral Daily   donepezil   10 mg Oral QHS   escitalopram   5 mg Oral Daily   fluticasone   2 spray Each Nare Daily   loratadine   10 mg Oral Daily   melatonin  10 mg Oral QHS   naltrexone   50 mg Oral Daily   pantoprazole  (PROTONIX ) IV  40 mg Intravenous Q12H   polyethylene glycol-electrolytes  4,000 mL Oral Once   pravastatin   20 mg Oral QHS   Continuous Infusions:  sodium chloride  100 mL/hr at 05/07/24 0035   PRN Meds: acetaminophen  **OR** acetaminophen , ondansetron  **OR** ondansetron  (ZOFRAN ) IV, traZODone   Time spent: 55 minutes  Author: Althia Atlas. MD [facility] 05/07/2024 1:13 PM  To reach On-call, see care teams to locate the attending and reach out to them via [email or web address]. If 7PM-7AM, please contact night-coverage If you still have difficulty reaching the attending provider, please page the [facility] (Director on Call) for [facility] on amion for assistance.

## 2024-05-07 NOTE — Assessment & Plan Note (Signed)
-  Continue Lexapro

## 2024-05-07 NOTE — Consult Note (Signed)
 Marnee Sink, MD [facility]  [address], [address] [place], [place] [address] Phone: [phone] Fax : [phone]  Consultation  Referring Provider:     Dr. Achilles Holes Primary Care Physician:  Evette Hoes, NP Primary Gastroenterologist: [facility]         Reason for Consultation:     GI Bleeding  Date of Admission:  05/06/2024 Date of Consultation:  05/07/2024         HPI:   [name] is a 63 y.o. male who has a history of having GI bleeding.  The patient had a colonoscopy back in 2023 for anemia and subsequent to that had a upper endoscopy in November 2023 by Dr. Mamie Searles , May 2024 and then had a small bowel enteroscopy in May 2024 by Dr. Antony Baumgartner with a few AVMs treated with cautery.  The patient presented the next month in June with bleeding again and had a repeat small bowel enteroscopy by Dr. Baldomero Bone which was normal.  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  The patient now comes in with months of black stool and iron  studies that show low iron  with low iron  saturation.  The patient's blood work has shown:  Component     Latest Ref Rng 06/02/2023 05/06/2024 05/07/2024  Hemoglobin     13.0 - 17.0 g/dL 8.3 (L)  7.1 (L)  6.8 (L)   Hemoglobin       8.3 (L)  6.7 (L)   HCT     39.0 - 52.0 % 27.6 (L)  22.9 (L)  21.5 (L)   HCT       27.0 (L)  21.3 (L)    The patient denies any bright red blood per rectum or hematemesis.  Past Medical History:  Diagnosis Date   COPD (chronic obstructive pulmonary disease) (HCC)    CVA (cerebral infarction)    Headache    mild, since stroke 2012   Hypertension    MI (myocardial infarction) (HCC) 05/09/2012   Reading difficulty    pt reports he reads at "about a second grade level"   Stroke [facility])  2012   numbness to left hand    Wears dentures    full upper and lower    Past Surgical History:  Procedure Laterality Date   CAROTID ENDARTERECTOMY Left    2012 or 2013   CHOLECYSTECTOMY N/A 07/26/2016   Procedure: LAPAROSCOPIC CHOLECYSTECTOMY;  Surgeon: Claudia Cuff, MD;  Location: [facility] ORS;  Service: General;  Laterality: N/A;   COLONOSCOPY WITH PROPOFOL  N/A 02/09/2022   Procedure: COLONOSCOPY WITH PROPOFOL ;  Surgeon: Marnee Sink, MD;  Location: [facility] ENDOSCOPY;  Service: Endoscopy;  Laterality: N/A;   CORONARY ARTERY BYPASS GRAFT  05/10/2012   3 vessel   ENTEROSCOPY N/A 05/16/2023   Procedure: ENTEROSCOPY;  Surgeon: Luke Salaam, MD;  Location: [facility] ENDOSCOPY;  Service: Gastroenterology;  Laterality: N/A;   ENTEROSCOPY N/A 06/01/2023   Procedure: ENTEROSCOPY;  Surgeon: Selena Daily, MD;  Location: [facility] ENDOSCOPY;  Service: Gastroenterology;  Laterality: N/A;   ESOPHAGOGASTRODUODENOSCOPY  05/13/2023   Procedure: ESOPHAGOGASTRODUODENOSCOPY (EGD);  Surgeon: Marnee Sink, MD;  Location: [facility] ENDOSCOPY;  Service: Endoscopy;;   ESOPHAGOGASTRODUODENOSCOPY (EGD) WITH PROPOFOL  N/A 10/28/2022   Procedure: ESOPHAGOGASTRODUODENOSCOPY (EGD) WITH PROPOFOL ;  Surgeon: Quintin Buckle, DO;  Location: [facility] ENDOSCOPY;  Service: Gastroenterology;  Laterality: N/A;   GIVENS CAPSULE STUDY  05/13/2023   Procedure: GIVENS CAPSULE STUDY;  Surgeon: Marnee Sink, MD;  Location: [facility] ENDOSCOPY;  Service: Endoscopy;;   GIVENS CAPSULE STUDY  06/01/2023   Procedure: GIVENS CAPSULE STUDY;  Surgeon: Selena Daily, MD;  Location: [facility] ENDOSCOPY;  Service: Gastroenterology;;    Prior to Admission medications   Medication Sig Start Date End Date Taking? Authorizing Provider  albuterol  (VENTOLIN  HFA) 108 (90 Base) MCG/ACT inhaler Inhale 2 puffs into the lungs every 6 (six) hours as needed for wheezing or shortness of breath. Patient not taking: Reported on 10/16/2023 03/13/23   Suellyn Emory, MD   aspirin  EC (ASPIRIN  LOW DOSE) 81 MG tablet Take 81 mg by mouth daily. Patient not taking: Reported on 05/06/2024 04/27/23   [provider]  cetirizine (ZYRTEC) 10 MG tablet Take 10 mg by mouth daily. Patient not taking: Reported on 05/06/2024 08/04/22   [provider]  donepezil  (ARICEPT ) 10 MG tablet Take 10 mg by mouth daily.  Patient not taking: Reported on 05/06/2024    [provider]  escitalopram  (LEXAPRO ) 5 MG tablet Take 5 mg by mouth daily. Patient not taking: Reported on 05/06/2024 04/19/23   [provider]  fluticasone  (FLONASE ) 50 MCG/ACT nasal spray Place 2 sprays into both nostrils daily. Patient not taking: Reported on 05/06/2024 08/04/22   [provider]  Melatonin 10 MG CAPS Take 1 capsule by mouth at bedtime. Patient not taking: Reported on 05/06/2024 08/04/22   [provider]  naltrexone  (DEPADE) 50 MG tablet Take 50 mg by mouth daily. Patient not taking: Reported on 05/06/2024 04/16/23   [provider]  omeprazole  (PRILOSEC) 20 MG capsule Take 1 capsule (20 mg total) by mouth 2 (two) times daily before a meal. Patient not taking: Reported on 05/06/2024 05/18/23   Margery Sheets B, MD  polyethylene glycol (MIRALAX ) 17 g packet Take 17 g by mouth daily. Patient not taking: Reported on 05/06/2024 06/29/23   Lind Repine, MD  pravastatin  (PRAVACHOL ) 20 MG tablet Take 20 mg by mouth at bedtime.  Patient not taking: Reported on 05/06/2024    [provider]  sodium phosphate (FLEET) 7-19 GM/118ML ENEM Place 133 mLs (1 enema total) rectally once as needed for up to 1 dose for severe constipation. Patient not taking: Reported on 05/06/2024 06/29/23   Lind Repine, MD  TRELEGY ELLIPTA 100-62.5-25 MCG/ACT AEPB Inhale 1 puff into the lungs daily. Patient not taking: Reported on 05/06/2024 09/27/23   [provider]  vitamin B-12 (CYANOCOBALAMIN ) 1000 MCG tablet Take 1 tablet (1,000 mcg total) by  mouth daily. Patient not taking: Reported on 05/06/2024 06/05/21   Kraig Peru, MD    Family History  Problem Relation Age of Onset   Pneumonia Mother      Social History   Tobacco Use   Smoking status: Former    Current packs/day: 0.00    Types: Cigarettes    Quit date: 1993    Years since quitting: 32.4   Smokeless tobacco: Current    Types: Snuff   Tobacco comments:    changed to dip  Vaping Use   Vaping status: Never Used  Substance Use Topics   Alcohol  use: Yes    Alcohol /week: 26.0 standard drinks of alcohol     Types: 26 Cans of beer per week   Drug use: No    Allergies as of 05/06/2024 -  Review Complete 05/06/2024  Allergen Reaction Noted   Other  01/17/2022    Review of Systems:    All systems reviewed and negative except where noted in HPI.   Physical Exam:  Vital signs in last 24 hours: Temp:  [97.9 F (36.6 C)-98.5 F (36.9 C)] 97.9 F (36.6 C) (05/21 0736) Pulse Rate:  [85-109] 85 (05/21 0736) Resp:  [13-22] 16 (05/21 0736) BP: (92-131)/(62-77) 119/62 (05/21 0736) SpO2:  [100 %] 100 % (05/21 0736) Weight:  [104.3 kg] 104.3 kg (05/20 1758) Last BM Date : 05/06/24 General:   Pleasant, cooperative in NAD Head:  Normocephalic and atraumatic. Eyes:   No icterus.   Conjunctiva pink. PERRLA. Ears:  Normal auditory acuity. Neck:  Supple; no masses or thyroidomegaly Lungs: Respirations even and unlabored. Lungs clear to auscultation bilaterally.   No wheezes, crackles, or rhonchi.  Heart:  Regular rate and rhythm;  Without murmur, clicks, rubs or gallops Abdomen:  Soft, nondistended, nontender. Normal bowel sounds. No appreciable masses or hepatomegaly.  No rebound or guarding.  Rectal:  Not performed. Msk:  Symmetrical without gross deformities.   Extremities:  Without edema, cyanosis or clubbing. Neurologic:  Alert and oriented x3;  grossly normal neurologically. Skin:  Intact without significant lesions or rashes. Cervical Nodes:  No significant  cervical adenopathy. Psych:  Alert and cooperative. Normal affect.  LAB RESULTS: Recent Labs    05/06/24 1812 05/06/24 2226 05/07/24 0456 05/07/24 1027  WBC 12.7*  --  9.4  --   HGB 8.3* 7.1* 6.7* 6.8*  HCT 27.0* 22.9* 21.3* 21.5*  PLT 314  --  207  --    BMET Recent Labs    05/06/24 1812 05/07/24 0456  NA 135 139  K 3.8 3.9  CL 102 105  CO2 22 26  GLUCOSE 115* 98  BUN 16 17  CREATININE 0.96 0.93  CALCIUM  8.6* 8.3*   LFT Recent Labs    05/06/24 1812  PROT 7.1  ALBUMIN 3.1*  AST 34  ALT 27  ALKPHOS 61  BILITOT 1.0   PT/INR Recent Labs    05/06/24 1812  LABPROT 13.2  INR 1.0    STUDIES: No results found.    Impression / Plan:   Assessment: Principal Problem:   GI bleeding Active Problems:   Dyslipidemia   Depression   Chronic obstructive pulmonary disease (COPD) (HCC)   [name] is a 63 y.o. 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  The recommendation at that time would be to approach from the lower GI track to see if those lesions could be found and cauterized.  Plan:  The patient will be set up for a colonoscopy with a plan to look at the distal small bowel and cauterize any AVMs seen at that time.  The patient has been explained the plan and will be prepped tonight while being kept on a clear liquid diet.  The patient will be n.p.o. after midnight for the procedure for tomorrow.  The patient has been explained the plan and agrees with it.  Thank you for involving me in the care of this patient.      LOS: 1 day   Marnee Sink, MD, MD. Sylvan Evener 05/07/2024, 12:55 PM,  Pager [phone] 7am-5pm  Check AMION for 5pm -7am coverage and on weekends   Note: This dictation was prepared with Dragon dictation along with smaller  Lobbyist. Any transcriptional errors that result from this process are unintentional.

## 2024-05-07 NOTE — TOC CM/SW Note (Signed)
 Transition of Care [facility]) - Inpatient Brief Assessment   Patient Details  Name: [name] MRN: [id number] Date of Birth: 01-04-1961  Transition of Care [facility]) CM/SW Contact:    Loman Risk, RN Phone Number: 05/07/2024, 11:33 AM   Clinical Narrative:   Transition of Care [facility]) Screening Note   Patient Details  Name: [name] Date of Birth: [birth year]   Transition of Care [facility]) CM/SW Contact:    Loman Risk, RN Phone Number: 05/07/2024, 11:33 AM    Transition of Care Department [facility]) has reviewed patient and no TOC needs have been identified at this time. . If new patient transition needs arise, please place a TOC consult.    Transition of Care Asessment: Insurance and Status: Insurance coverage has been reviewed Patient has primary care physician: Yes     Prior/Current Home Services: No current home services Social Drivers of Health Review: SDOH reviewed no interventions necessary (Report shows housing and transport ressources indicated.  However the screening was completed in 2024, and resources were provided at that time) Readmission risk has been reviewed: Yes Transition of care needs: no transition of care needs at this time

## 2024-05-07 NOTE — Assessment & Plan Note (Signed)
 Will continue statin therapy

## 2024-05-08 ENCOUNTER — Inpatient Hospital Stay: Admitting: General Practice

## 2024-05-08 ENCOUNTER — Inpatient Hospital Stay

## 2024-05-08 ENCOUNTER — Encounter
Admission: EM | Disposition: A | Discharge status: Home Health | Payer: Self-pay | Source: Home / Self Care | Attending: Student

## 2024-05-08 DIAGNOSIS — I2489 Other forms of acute ischemic heart disease: Secondary | ICD-10-CM

## 2024-05-08 DIAGNOSIS — I2510 Atherosclerotic heart disease of native coronary artery without angina pectoris: Secondary | ICD-10-CM

## 2024-05-08 DIAGNOSIS — K641 Second degree hemorrhoids: Secondary | ICD-10-CM

## 2024-05-08 DIAGNOSIS — R0789 Other chest pain: Secondary | ICD-10-CM

## 2024-05-08 DIAGNOSIS — R000 Tachycardia, unspecified: Secondary | ICD-10-CM

## 2024-05-08 DIAGNOSIS — K921 Melena: Secondary | ICD-10-CM | POA: Diagnosis not present

## 2024-05-08 DIAGNOSIS — J449 Chronic obstructive pulmonary disease, unspecified: Secondary | ICD-10-CM

## 2024-05-08 DIAGNOSIS — R0602 Shortness of breath: Secondary | ICD-10-CM

## 2024-05-08 DIAGNOSIS — I5031 Acute diastolic (congestive) heart failure: Secondary | ICD-10-CM

## 2024-05-08 HISTORY — PX: COLONOSCOPY: SHX5424

## 2024-05-08 LAB — COMPREHENSIVE METABOLIC PANEL WITH GFR
ALT: 24 U/L (ref 0–44)
AST: 37 U/L (ref 15–41)
Albumin: 3.1 g/dL — ABNORMAL LOW (ref 3.5–5.0)
Alkaline Phosphatase: 61 U/L (ref 38–126)
Anion gap: 11 (ref 5–15)
BUN: 12 mg/dL (ref 8–23)
CO2: 22 mmol/L (ref 22–32)
Calcium: 8.6 mg/dL — ABNORMAL LOW (ref 8.9–10.3)
Chloride: 103 mmol/L (ref 98–111)
Creatinine, Ser: 1.03 mg/dL (ref 0.61–1.24)
GFR, Estimated: >60 mL/min (ref >60)
Glucose, Bld: 235 mg/dL — ABNORMAL HIGH (ref 70–99)
Potassium: 4.1 mmol/L (ref 3.5–5.1)
Sodium: 136 mmol/L (ref 135–145)
Total Bilirubin: 1.6 mg/dL — ABNORMAL HIGH (ref 0.0–1.2)
Total Protein: 6.9 g/dL (ref 6.5–8.1)

## 2024-05-08 LAB — TYPE AND SCREEN
ABO/RH(D): A POS
Antibody Screen: NEGATIVE
Unit division: 0

## 2024-05-08 LAB — CBC
HCT: 24.5 % — ABNORMAL LOW (ref 39.0–52.0)
HCT: 26.4 % — ABNORMAL LOW (ref 39.0–52.0)
Hemoglobin: 7.8 g/dL — ABNORMAL LOW (ref 13.0–17.0)
Hemoglobin: 8.2 g/dL — ABNORMAL LOW (ref 13.0–17.0)
MCH: 28.2 pg (ref 26.0–34.0)
MCH: 28.3 pg (ref 26.0–34.0)
MCHC: 31.8 g/dL (ref 30.0–36.0)
MCHC: 31.1 g/dL (ref 30.0–36.0)
MCV: 88.4 fL (ref 80.0–100.0)
MCV: 91 fL (ref 80.0–100.0)
Platelets: 247 10*3/uL (ref 150–400)
Platelets: 262 10*3/uL (ref 150–400)
RBC: 2.77 MIL/uL — ABNORMAL LOW (ref 4.22–5.81)
RBC: 2.9 MIL/uL — ABNORMAL LOW (ref 4.22–5.81)
RDW: 19.7 % — ABNORMAL HIGH (ref 11.5–15.5)
RDW: 20.2 % — ABNORMAL HIGH (ref 11.5–15.5)
WBC: 10.9 10*3/uL — ABNORMAL HIGH (ref 4.0–10.5)
WBC: 17.8 10*3/uL — ABNORMAL HIGH (ref 4.0–10.5)
nRBC: 0 % (ref 0.0–0.2)
nRBC: 0 % (ref 0.0–0.2)

## 2024-05-08 LAB — BASIC METABOLIC PANEL WITH GFR
Anion gap: 5 (ref 5–15)
BUN: 11 mg/dL (ref 8–23)
CO2: 28 mmol/L (ref 22–32)
Calcium: 8.4 mg/dL — ABNORMAL LOW (ref 8.9–10.3)
Chloride: 106 mmol/L (ref 98–111)
Creatinine, Ser: 0.94 mg/dL (ref 0.61–1.24)
GFR, Estimated: >60 mL/min (ref >60)
Glucose, Bld: 89 mg/dL (ref 70–99)
Potassium: 4.5 mmol/L (ref 3.5–5.1)
Sodium: 139 mmol/L (ref 135–145)

## 2024-05-08 LAB — GLUCOSE, CAPILLARY
Glucose-Capillary: 176 mg/dL — ABNORMAL HIGH (ref 70–99)
Glucose-Capillary: 210 mg/dL — ABNORMAL HIGH (ref 70–99)

## 2024-05-08 LAB — BLOOD GAS, VENOUS
Acid-Base Excess: 3.7 mmol/L — ABNORMAL HIGH (ref 0.0–2.0)
Bicarbonate: 27.7 mmol/L (ref 20.0–28.0)
O2 Saturation: 89.7 %
Patient temperature: 37
pCO2, Ven: 39 mmHg — ABNORMAL LOW (ref 44–60)
pH, Ven: 7.46 — ABNORMAL HIGH (ref 7.25–7.43)
pO2, Ven: 58 mmHg — ABNORMAL HIGH (ref 32–45)

## 2024-05-08 LAB — TROPONIN I (HIGH SENSITIVITY)
Troponin I (High Sensitivity): 184 ng/L (ref <18)
Troponin I (High Sensitivity): 124 ng/L (ref <18)
Troponin I (High Sensitivity): 100 ng/L (ref <18)
Troponin I (High Sensitivity): 92 ng/L — ABNORMAL HIGH (ref <18)
Troponin I (High Sensitivity): 263 ng/L (ref <18)
Troponin I (High Sensitivity): 338 ng/L (ref <18)

## 2024-05-08 LAB — BPAM RBC
Blood Product Expiration Date: 202506182359
ISSUE DATE / TIME: 202505211308
Unit Type and Rh: 6200

## 2024-05-08 LAB — HEMOGLOBIN AND HEMATOCRIT, BLOOD
HCT: 25.8 % — ABNORMAL LOW (ref 39.0–52.0)
Hemoglobin: 8.1 g/dL — ABNORMAL LOW (ref 13.0–17.0)

## 2024-05-08 LAB — PHOSPHORUS: Phosphorus: 3.8 mg/dL (ref 2.5–4.6)

## 2024-05-08 LAB — MAGNESIUM: Magnesium: 2.1 mg/dL (ref 1.7–2.4)

## 2024-05-08 LAB — MRSA NEXT GEN BY PCR, NASAL: MRSA by PCR Next Gen: NOT DETECTED

## 2024-05-08 LAB — BRAIN NATRIURETIC PEPTIDE: B Natriuretic Peptide: 614.9 pg/mL — ABNORMAL HIGH (ref 0.0–100.0)

## 2024-05-08 SURGERY — COLONOSCOPY
Anesthesia: General

## 2024-05-08 MED ORDER — DEXMEDETOMIDINE HCL IN NACL 80 MCG/20ML IV SOLN
INTRAVENOUS | Status: AC
  Filled 2024-05-08: qty 20

## 2024-05-08 MED ORDER — PROPOFOL 1000 MG/100ML IV EMUL
INTRAVENOUS | Status: AC
  Filled 2024-05-08: qty 100

## 2024-05-08 MED ORDER — SODIUM CHLORIDE 0.9 % IV SOLN: INTRAVENOUS | Status: DC

## 2024-05-08 MED ORDER — DEXAMETHASONE SODIUM PHOSPHATE 10 MG/ML IJ SOLN
INTRAMUSCULAR | Status: DC | PRN
  Administered 2024-05-08: 10 mg via INTRAVENOUS

## 2024-05-08 MED ORDER — VITAMIN D (ERGOCALCIFEROL) 1.25 MG (50000 UNIT) PO CAPS
50000.0000 [IU] | ORAL_CAPSULE | ORAL | Status: DC
Start: 2024-05-08 — End: 2024-07-03
  Administered 2024-05-08: 50000 [IU] via ORAL
  Filled 2024-05-08: qty 1

## 2024-05-08 MED ORDER — LACTATED RINGERS IV SOLN: INTRAVENOUS | Status: AC

## 2024-05-08 MED ORDER — DEXAMETHASONE SODIUM PHOSPHATE 10 MG/ML IJ SOLN
INTRAMUSCULAR | Status: AC
  Filled 2024-05-08: qty 1

## 2024-05-08 MED ORDER — PROPOFOL 500 MG/50ML IV EMUL
INTRAVENOUS | Status: DC | PRN
  Administered 2024-05-08: 100 ug/kg/min via INTRAVENOUS

## 2024-05-08 MED ORDER — NITROGLYCERIN 0.4 MG SL SUBL
0.4000 mg | SUBLINGUAL_TABLET | SUBLINGUAL | Status: DC | PRN
  Administered 2024-05-08 (×3): 0.4 mg via SUBLINGUAL
  Filled 2024-05-08: qty 1

## 2024-05-08 MED ORDER — LIDOCAINE HCL (CARDIAC) PF 100 MG/5ML IV SOSY
PREFILLED_SYRINGE | INTRAVENOUS | Status: DC | PRN
  Administered 2024-05-08: 60 mg via INTRAVENOUS

## 2024-05-08 MED ORDER — LIDOCAINE HCL (PF) 2 % IJ SOLN
INTRAMUSCULAR | Status: AC
  Filled 2024-05-08: qty 5

## 2024-05-08 MED ORDER — NITROGLYCERIN IN D5W 200-5 MCG/ML-% IV SOLN
0.0000 ug/min | INTRAVENOUS | Status: DC
  Filled 2024-05-08: qty 250

## 2024-05-08 MED ORDER — IPRATROPIUM-ALBUTEROL 0.5-2.5 (3) MG/3ML IN SOLN
3.0000 mL | RESPIRATORY_TRACT | Status: DC | PRN
  Administered 2024-05-09 – 2024-05-10 (×2): 3 mL via RESPIRATORY_TRACT
  Filled 2024-05-08 (×3): qty 3

## 2024-05-08 MED ORDER — CHLORHEXIDINE GLUCONATE CLOTH 2 % EX PADS
6.0000 | MEDICATED_PAD | Freq: Every day | CUTANEOUS | Status: DC
Start: 2024-05-08 — End: 2024-05-14
  Administered 2024-05-08 – 2024-05-14 (×7): 6 via TOPICAL

## 2024-05-08 MED ORDER — IPRATROPIUM-ALBUTEROL 0.5-2.5 (3) MG/3ML IN SOLN
RESPIRATORY_TRACT | Status: AC
Start: 2024-05-08 — End: ?
  Filled 2024-05-08: qty 3

## 2024-05-08 MED ORDER — IPRATROPIUM-ALBUTEROL 0.5-2.5 (3) MG/3ML IN SOLN
3.0000 mL | Freq: Once | RESPIRATORY_TRACT | Status: AC
  Administered 2024-05-08: 3 mL via RESPIRATORY_TRACT

## 2024-05-08 MED ORDER — SODIUM CHLORIDE 0.9 % IV SOLN
INTRAVENOUS | Status: DC | PRN
Start: 2024-05-08 — End: 2024-05-08

## 2024-05-08 MED ORDER — DEXMEDETOMIDINE HCL IN NACL 80 MCG/20ML IV SOLN
INTRAVENOUS | Status: DC | PRN
  Administered 2024-05-08: 8 ug via INTRAVENOUS

## 2024-05-08 MED ORDER — MORPHINE SULFATE (PF) 2 MG/ML IV SOLN
2.0000 mg | INTRAVENOUS | Status: DC | PRN
  Administered 2024-05-08 – 2024-05-10 (×6): 2 mg via INTRAVENOUS
  Filled 2024-05-08 (×7): qty 1

## 2024-05-08 MED ORDER — PROPOFOL 10 MG/ML IV BOLUS
INTRAVENOUS | Status: DC | PRN
  Administered 2024-05-08: 20 mg via INTRAVENOUS
  Administered 2024-05-08: 5 mg via INTRAVENOUS
  Administered 2024-05-08: 20 mg via INTRAVENOUS

## 2024-05-08 NOTE — Transfer of Care (Signed)
 Immediate Anesthesia Transfer of Care Note  Patient: [name]  Procedure(s) Performed: COLONOSCOPY  Patient Location: PACU  Anesthesia Type:General  Level of Consciousness: drowsy and patient cooperative  Airway & Oxygen Therapy: Patient Spontanous Breathing  Post-op Assessment: Report given to RN and Post -op Vital signs reviewed and stable  Post vital signs: stable  Last Vitals:  Vitals Value Taken Time  BP    Temp    Pulse    Resp    SpO2      Last Pain:  Vitals:   05/08/24 0800  TempSrc:   PainSc: 0-No pain         Complications: No notable events documented.

## 2024-05-08 NOTE — Plan of Care (Signed)

## 2024-05-08 NOTE — Progress Notes (Signed)
 Arrived in Endo Recovery coughing, wheezing and short of breath. Ox 82%. Simple mask with 6 l adm. Duoneb nebulizer given. Dr. Mazzoni notified. Suctioned and patient able to cough. C/o chest tightness and shoulder pain. CXR ordered

## 2024-05-08 NOTE — Progress Notes (Signed)
 Responded to rapid response. On arrival staff in room. \Was told provider had been called. Pt c/o 10/10 CP, and shoulder pain RR 35. Diaphoretic. Told that Dr. Hubert Madden had been talking with them. Secure chat sent to Dr. Hubert Madden explaining pt was still having symptoms as described above. EKG was done.Dr. Beau Bound paged and returned call.

## 2024-05-08 NOTE — Anesthesia Postprocedure Evaluation (Signed)
 Anesthesia Post Note  Patient: [name]  Procedure(s) Performed: COLONOSCOPY  Patient location during evaluation: PACU Anesthesia Type: General Level of consciousness: awake and alert, oriented and patient cooperative Pain management: pain level controlled Vital Signs Assessment: post-procedure vital signs reviewed and stable Respiratory status: spontaneous breathing, nonlabored ventilation and respiratory function stable Cardiovascular status: blood pressure returned to baseline and stable Postop Assessment: adequate PO intake Anesthetic complications: no Comments: Anesthesia intra-op course uneventful, but in recovery [name] complained of chest and bilateral shoulder pain. Ordered a 12-lead ECG, CXR, and troponins.  His ECG was unchanged from prior.  CXR and troponins are pending.  Vitals stable. DuoNeb also given for wheezing.  Hospitalist Dr. Hubert Madden updated; Dr. Hubert Madden also mentioned that the chest and shoulder pain is not new for this patient, and prior ECG and troponins were negative.  Will continue to follow.   No notable events documented.   Last Vitals:  Vitals:   05/08/24 1435 05/08/24 1445  BP: 110/60   Pulse: (!) 105 (!) 101  Resp: 19   Temp:    SpO2: 97%     Last Pain:  Vitals:   05/08/24 1435  TempSrc:   PainSc: 5                  Dorothey Gate

## 2024-05-08 NOTE — Significant Event (Signed)
 Rapid Response Event Note   Reason for Call :  Chest pain  Initial Focused Assessment:  Rapid response RN arrived with patient surrounded by RTs and 2C nursing staff. Patient sitting in bed, diaphoretic, and working to breathe. Reports no shortness of breath just 10/10 chest pain from upper middle of his chest radiating to his left shoulder. HR 140, BP 155/105, RR upper 20s, oxygen saturations 94% on 4L nasal cannula. Patient already had two troponin level resulted (see lab results) and received morphine  per orders just after 18:00.  Interventions:  3 doses of prn nitroglycerin given to patient per orders (see eMAR). After first chest pain went down to 9/10 HR 133 BP 101/68 MAP 78. After this first dose, patient's breathing calmer and skin dry. After second dose patient reported pain at 8/10 BP HR 128 BP 98/68 MAP 78. After third patient reported pain 7/10 BP 96/63 MAP 74 HR 129. Spoke with Dr. Beau Bound, on call cardiologist, who ordered patient transferred to SD level of care and a nitroglycerin infusion ordered for patient's chest pain.  Plan of Care:  Patient transferred to ICU 3. Dr. Guss Legacy arrived at bedside in ICU 3 to personally assess patient, conferred with Dr. Beau Bound, and make changes to patient's orders as needed.   Event Summary:   MD Notified: Dr. Hubert Madden, Dr. Beau Bound, Dr. Guss Legacy Call Time: 18:26 Arrival Time: 18:28 End Time: 19:03  Versie Gores, RN

## 2024-05-08 NOTE — Anesthesia Preprocedure Evaluation (Signed)
 Anesthesia Evaluation  Patient identified by MRN, date of birth, ID band Patient awake    Reviewed: Allergy & Precautions, NPO status , Patient's Chart, lab work & pertinent test results  History of Anesthesia Complications Negative for: history of anesthetic complications  Airway Mallampati: I   Neck ROM: Full    Dental  (+) Edentulous Upper, Edentulous Lower   Pulmonary COPD (on home O2 PRN), former smoker (quit 1993)   Pulmonary exam normal breath sounds clear to auscultation       Cardiovascular hypertension, + CAD (s/p MI and CABG 2013) and + Peripheral Vascular Disease (bilateral carotid stenosis s/p CEA)  + Valvular Problems/Murmurs AS  Rhythm:Regular Rate:Normal + Systolic murmurs ECG 05/06/24: Sinus tachycardia Biatrial enlargement  Myocardial perfusion 08/11/19:  LVEF= 60%   Regional wall motion:   Normal function  The overall quality of the study is fair.   Artifacts noted: no  Left ventricular cavity: normal.  Perfusion Analysis:  SPECT images demonstrate small perfusion abnormality of mild intensity is present in the apical myocardial region on the stress images.  Resting images show minimal apical myocardial perfusion defect consistent with previous infarct and/or scar without evidence of myocardial ischemia defect type : Fixed  Echo 08/11/19:  NORMAL LEFT VENTRICULAR SYSTOLIC FUNCTION   WITH MILD LVH  NORMAL RIGHT VENTRICULAR SYSTOLIC FUNCTION  TRIVIAL REGURGITATION NOTED  MILD AORTIC VALVE STENOSIS WITH A CALCULATED AVA = 1.2 cm^2 BY CONTINUITY EQUATION  TECHNICALLY DIFFICULT STUDY  POOR SOUND TRANSMISSION  LIMITED VIEWS    Neuro/Psych  Headaches Chewing tobacco use; alcohol  use disorder, 3 beers per day CVA (2012, residual left hand numbness)    GI/Hepatic negative GI ROS,,,  Endo/Other  Obesity   Renal/GU Renal disease (stage III CKD)     Musculoskeletal   Abdominal   Peds  Hematology  (+)  Blood dyscrasia, anemia   Anesthesia Other Findings Cardiology note 07/18/23:  63 y.o. male with  1. Coronary artery disease involving coronary bypass graft of native heart without angina pectoris  2. Benign essential HTN  3. Mild aortic valve stenosis  4. SOB (shortness of breath)  5. Hyperlipidemia, mixed  6. History of stroke  7. Bilateral carotid artery stenosis  8. Stage 3a chronic kidney disease (CMS/HHS-HCC)   63 year old gentleman status post CABG times 3,05/10/2012, currently without chest pain. 2D echocardiogram 05/15/2023 revealed LVEF 60-65% with moderate to severe aortic stenosis (0.8 cm / 3.06 m/s / 37.5 mmHg / 22.0 mmHg). The patient has essential hypertension, blood pressure well-controlled on current BP medications.  Plan   1. Continue current medications 2. Counseled patient about low-sodium diet 3. DASH diet printed instructions given to patient 4. Counseled patient about low-cholesterol diet 5. Continue pravastatin  for hyperlipidemia management 6. Low-fat and cholesterol diet printed instructions given to the patient 7. Return to clinic for follow-up in 4 months  No orders of the defined types were placed in this encounter.  Return in about 4 months (around 11/17/2023).    Reproductive/Obstetrics                              Anesthesia Physical Anesthesia Plan  ASA: 4  Anesthesia Plan: General   Post-op Pain Management:    Induction: Intravenous  PONV Risk Score and Plan: 2 and Treatment may vary due to age or medical condition  Airway Management Planned: Natural Airway  Additional Equipment:   Intra-op Plan:   Post-operative Plan:   Informed  Consent: I have reviewed the patients History and Physical, chart, labs and discussed the procedure including the risks, benefits and alternatives for the proposed anesthesia with the patient or authorized representative who has indicated his/her understanding and acceptance.        Plan Discussed with: CRNA  Anesthesia Plan Comments: (LMA/GETA backup discussed.  Patient consented for risks of anesthesia including but not limited to:  - adverse reactions to medications - damage to eyes, teeth, lips or other oral mucosa - nerve damage due to positioning  - sore throat or hoarseness - damage to heart, brain, nerves, lungs, other parts of body or loss of life  Informed patient about role of CRNA in peri- and intra-operative care.  Patient voiced understanding.)         Anesthesia Quick Evaluation

## 2024-05-08 NOTE — Progress Notes (Signed)
 [facility] Progress Note  Patient: [name]    ZOX:[id number]  DOA: 05/06/2024     Date of Service: the patient was seen and examined on 05/08/2024  Chief Complaint  Patient presents with   Dizziness   Blood In Stools   Brief hospital course: [name] is a 63 y.o. Caucasian male with PMH of PMH of 3vCAD w/ hx inferior STEMI (2013) s/p PCI with stent RCA + CABG x 3 ([facility]), moderate-severe aortic stenosis (10/2022), HFpEF, HTN, COPD, CVA, who presented to the ED with c/o melena over the last few weeks.  No bright red bleeding, no nausea vomiting, no abdominal pain. C/o chest pain and bilateral shoulder ache which resolved.   ED Course: VS BP 92/77 HR 109 and RR 22  Labs: calcium  8.6 and albumin 3.1 with otherwise unremarkable CMP.  High-sensitivity troponin was 15 twice.   CBC: wbc 12.7 with neutrophilia and anemia with Hb 8.3  Blood glucose A+ with negative antibody screen. EKG as reviewed by me : EKG showed sinus tachycardia with a rate of 108 with biatrial enlargement. Imaging: None.   The patient was 20 mg of IV Pepcid .  He will be admitted to a medical telemetry bed for further evaluation and management.   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  At that time it was recommended to approach the lesions via lower GI tract to find and cauterize. Continue PPI for now GI consulted, s/p colonoscopy, no source of bleeding identified.  GI recommended CTA and IR embolization of GI bleeding reoccurs.  Monitor H&H.  # Acute blood loss anemia due to GI bleeding as above 5/21, Hb 6.7, 1 unit of PRBC transfused 5/22 Hb 8.1, stable Monitor H&H and transfuse as needed  # 3vCAD w/ hx inferior STEMI (2013) s/p PCI with stent RCA + CABG x 3 ([facility]), moderate-severe aortic stenosis (10/2022), HFpEF, HTN Patient had chest pain on arrival and  bilateral shoulder ache which resolved, tropes were negative. 5/22 again patient had chest pain and shoulder ache after colonoscopy which resolved.   Troponin 184 elevated, continue to trend Cardiology consulted for further recommendation   Iron  deficiency, transferrin saturation 13% 5/21 Venofer 3 mg IV one-time dose given followed by oral supplement Folate level 7.0 at lower end, started folic acid  1 mg p.o. daily   Chronic obstructive pulmonary disease (COPD) - Continue his inhalers.   Depression: Continue Lexapro .   Dyslipidemia: continue statin therapy.   Body mass index is 31.19 kg/m.  Interventions:   Diet: FLD DVT Prophylaxis: SCD, pharmacological prophylaxis contraindicated due to GI bleeding   Advance goals of care discussion: Full code  Family Communication: family was not present at bedside, at the time of interview.  The pt provided permission to discuss medical plan with the family. Opportunity was given to ask question and all questions were answered satisfactorily.   Disposition:  Pt is from home, admitted with GI bleeding, GI consulted, s/p colonoscopy on 5/22, which precludes a safe discharge. Discharge to home, when stable, most likely in 1 to 2 days.  Subjective: No significant events overnight, patient was seen during morning rounds, stated that he had 2 bowel movements last night which were dark, no bright red blood seen.  Patient was having difficulty drinking GoLytely . After the colonoscopy patient had some chest pain which resolved.  Physical Exam: General: NAD, lying comfortably Appear  in no distress, affect appropriate Eyes: PERRLA ENT: Oral Mucosa Clear, moist  Neck: no JVD,  Cardiovascular: S1 and S2 Present, no Murmur,  Respiratory: good respiratory effort, Bilateral Air entry equal and Decreased, no Crackles, no wheezes Abdomen: Bowel Sound present, Soft and no tenderness,  Skin: no rashes Extremities: no Pedal edema, no calf  tenderness Neurologic: without any new focal findings Gait not checked due to patient safety concerns  Vitals:   05/08/24 1435 05/08/24 1445 05/08/24 1509 05/08/24 1526  BP: 110/60 (!) 85/62  106/70  Pulse: (!) 105 97 90 91  Resp: 19 18 20  (!) 22  Temp:      TempSrc:      SpO2: 97% 95% 94% 93%  Weight:      Height:        Intake/Output Summary (Last 24 hours) at 05/08/2024 1600 Last data filed at 05/08/2024 1415 Gross per 24 hour  Intake 622 ml  Output 2100 ml  Net -1478 ml   Filed Weights   05/06/24 1758  Weight: 104.3 kg    Data Reviewed: I have personally reviewed and interpreted daily labs, tele strips, imagings as discussed above. I reviewed all nursing notes, pharmacy notes, vitals, pertinent old records I have discussed plan of care as described above with RN and patient/family.  CBC: Recent Labs  Lab 05/06/24 1812 05/06/24 2226 05/07/24 0456 05/07/24 1027 05/07/24 1735 05/07/24 2147 05/08/24 0559 05/08/24 1505  WBC 12.7*  --  9.4  --   --   --  10.9*  --   NEUTROABS 8.9*  --   --   --   --   --   --   --   HGB 8.3*   < > 6.7* 6.8* 8.2* 8.5* 7.8* 8.1*  HCT 27.0*   < > 21.3* 21.5* 25.8* 26.5* 24.5* 25.8*  MCV 87.7  --  86.9  --   --   --  88.4  --   PLT 314  --  207  --   --   --  247  --    < > = values in this interval not displayed.   Basic Metabolic Panel: Recent Labs  Lab 05/06/24 1812 05/07/24 0456 05/08/24 0559  NA 135 139 139  K 3.8 3.9 4.5  CL 102 105 106  CO2 22 26 28   GLUCOSE 115* 98 89  BUN 16 17 11   CREATININE 0.96 0.93 0.94  CALCIUM  8.6* 8.3* 8.4*  MG  --   --  2.1  PHOS  --   --  3.8    Studies: No results found.  Scheduled Meds:  [START ON 05/09/2024] vitamin C  500 mg Oral Daily   donepezil   10 mg Oral QHS   escitalopram   5 mg Oral Daily   fluticasone   2 spray Each Nare Daily   folic acid   1 mg Oral Daily   [START ON 05/09/2024] iron  polysaccharides  150 mg Oral Daily   loratadine   10 mg Oral Daily   melatonin  10 mg  Oral QHS   naltrexone   50 mg Oral Daily   pantoprazole  (PROTONIX ) IV  40 mg Intravenous Q12H   pravastatin   20 mg Oral QHS   Vitamin D (Ergocalciferol)  50,000 Units Oral Q7 days   Continuous Infusions:   PRN Meds: acetaminophen  **OR** acetaminophen , ondansetron  **OR** ondansetron  (ZOFRAN ) IV, traZODone   Time spent: 55 minutes  Author: Althia Atlas. MD [facility] 05/08/2024 4:00 PM  To reach On-call, see care teams  to locate the attending and reach out to them via [email or web address]. If 7PM-7AM, please contact night-coverage If you still have difficulty reaching the attending provider, please page the [facility] (Director on Call) for [facility] on amion for assistance.

## 2024-05-08 NOTE — Progress Notes (Signed)
 eLink Physician-Brief Progress Note Patient Name: [name] DOB: [birth year] MRN: [id number]   Date of Service  05/08/2024  HPI/Events of Note   63 y.o. male  with a past medical history of CAD s/p CABG x 3 (04/2012), chronic HFpEF, moderate to severe aortic stenosis, hypertension, hyperlipidemia, history of CVA, alcohol  use who presented to the ED on 05/06/2024 for epigastric pain and dark stools.  Patient underwent colonoscopy. After colonoscopy patient c/o chest pain.  Troponin found to be elevated.  EKG w/o obvious ischemic changes.  Cardiology evaluated.  eICU Interventions  Chart reviewed     Intervention Category Evaluation Type: New Patient Evaluation  Linda Repress, P 05/08/2024, 9:24 PM

## 2024-05-08 NOTE — Consult Note (Signed)
 [facility] CLINIC CARDIOLOGY CONSULT NOTE       Patient ID: [name] MRN: [id number] DOB/AGE: [birth year] 63 y.o.  Admit date: 05/06/2024 Referring Physician Dr. Althia Atlas  Primary Physician Evette Hoes, NP Primary Cardiologist Dr. Parks Bollman (last seen 2024) Reason for Consultation chest pain  HPI: [name] is a 63 y.o. male  with a past medical history of CAD s/p CABG x 3 (04/2012), chronic HFpEF, moderate to severe aortic stenosis, hypertension, hyperlipidemia history of CVA, alcohol  use who presented to the ED on 05/06/2024 for epigastric pain and dark stools.  Patient underwent colonoscopy due to found melena with AVMs on capsule endoscopy.  After colonoscopy patient chest pain that radiates to bilateral shoulders.  Troponin found to be elevated.  Cardiology was consulted for further evaluation.   Patient presented to the ED due to epigastric pain and dark stools. Work up in the ED notable for Na 135, K 3.8, WBC 12.7, hemoglobin 8.  Troponins negative x 2.  EKG in ED with sinus ischemic changes rate 108.  Patient found to have melena with AVMs and underwent colonoscopy today (05/22).  Colonoscopy patient reports chest pain that radiates to bilateral shoulders.  Troponin found to be elevated at 184.   At the time of my evaluation this afternoon, patient was lying flat and resting comfortably in hospital bed.  Discussed patient's symptoms in further detail.  Patient reports for the past 3 days he's been having intermittent chest pain that he's unable to describe that radiates to shoulders. Patient states he has SOB with exertion which he states is his baseline. Patient denies associated N/V or diaphoresis. Patient reports his intermittent chest pain rate is 5/10.    Review of systems complete and found to be negative unless listed above    Past Medical History:  Diagnosis Date   COPD (chronic obstructive pulmonary disease) (HCC)    CVA (cerebral infarction)    Headache     mild, since stroke 2012   Hypertension    MI (myocardial infarction) (HCC) 05/09/2012   Reading difficulty    pt reports he reads at "about a second grade level"   Stroke [facility]) 2012   numbness to left hand    Wears dentures    full upper and lower    Past Surgical History:  Procedure Laterality Date   CAROTID ENDARTERECTOMY Left    2012 or 2013   CHOLECYSTECTOMY N/A 07/26/2016   Procedure: LAPAROSCOPIC CHOLECYSTECTOMY;  Surgeon: Claudia Cuff, MD;  Location: [facility] ORS;  Service: General;  Laterality: N/A;   COLONOSCOPY WITH PROPOFOL  N/A 02/09/2022   Procedure: COLONOSCOPY WITH PROPOFOL ;  Surgeon: Marnee Sink, MD;  Location: [facility] ENDOSCOPY;  Service: Endoscopy;  Laterality: N/A;   CORONARY ARTERY BYPASS GRAFT  05/10/2012   3 vessel   ENTEROSCOPY N/A 05/16/2023   Procedure: ENTEROSCOPY;  Surgeon: Luke Salaam, MD;  Location: [facility] ENDOSCOPY;  Service: Gastroenterology;  Laterality: N/A;   ENTEROSCOPY N/A 06/01/2023   Procedure: ENTEROSCOPY;  Surgeon: Selena Daily, MD;  Location: [facility] ENDOSCOPY;  Service: Gastroenterology;  Laterality: N/A;   ESOPHAGOGASTRODUODENOSCOPY  05/13/2023   Procedure: ESOPHAGOGASTRODUODENOSCOPY (EGD);  Surgeon: Marnee Sink, MD;  Location: [facility] ENDOSCOPY;  Service: Endoscopy;;   ESOPHAGOGASTRODUODENOSCOPY (EGD) WITH PROPOFOL  N/A 10/28/2022   Procedure: ESOPHAGOGASTRODUODENOSCOPY (EGD) WITH PROPOFOL ;  Surgeon: Quintin Buckle, DO;  Location: [facility] ENDOSCOPY;  Service: Gastroenterology;  Laterality: N/A;   GIVENS CAPSULE STUDY  05/13/2023   Procedure: GIVENS CAPSULE STUDY;  Surgeon: Marnee Sink, MD;  Location: [facility] ENDOSCOPY;  Service: Endoscopy;;   GIVENS CAPSULE STUDY  06/01/2023   Procedure: GIVENS CAPSULE STUDY;  Surgeon: Selena Daily, MD;  Location: [facility] ENDOSCOPY;  Service: Gastroenterology;;    Medications Prior to Admission  Medication Sig Dispense Refill Last Dose/Taking   albuterol  (VENTOLIN  HFA) 108 (90 Base) MCG/ACT inhaler Inhale 2  puffs into the lungs every 6 (six) hours as needed for wheezing or shortness of breath. (Patient not taking: Reported on 10/16/2023) 8 g 2    aspirin  EC (ASPIRIN  LOW DOSE) 81 MG tablet Take 81 mg by mouth daily. (Patient not taking: Reported on 05/06/2024)   Not Taking   cetirizine (ZYRTEC) 10 MG tablet Take 10 mg by mouth daily. (Patient not taking: Reported on 05/06/2024)   Not Taking   donepezil  (ARICEPT ) 10 MG tablet Take 10 mg by mouth daily.  (Patient not taking: Reported on 05/06/2024)   Not Taking   escitalopram  (LEXAPRO ) 5 MG tablet Take 5 mg by mouth daily. (Patient not taking: Reported on 05/06/2024)   Not Taking   fluticasone  (FLONASE ) 50 MCG/ACT nasal spray Place 2 sprays into both nostrils daily. (Patient not taking: Reported on 05/06/2024)   Not Taking   Melatonin 10 MG CAPS Take 1 capsule by mouth at bedtime. (Patient not taking: Reported on 05/06/2024)   Not Taking   naltrexone  (DEPADE) 50 MG tablet Take 50 mg by mouth daily. (Patient not taking: Reported on 05/06/2024)   Not Taking   omeprazole  (PRILOSEC) 20 MG capsule Take 1 capsule (20 mg total) by mouth 2 (two) times daily before a meal. (Patient not taking: Reported on 05/06/2024) 60 capsule 0 Not Taking   polyethylene glycol (MIRALAX ) 17 g packet Take 17 g by mouth daily. (Patient not taking: Reported on 05/06/2024) 14 each 0 Not Taking   pravastatin  (PRAVACHOL ) 20 MG tablet Take 20 mg by mouth at bedtime.  (Patient not taking: Reported on 05/06/2024)   Not Taking   sodium phosphate (FLEET) 7-19 GM/118ML ENEM Place 133 mLs (1 enema total) rectally once as needed for up to 1 dose for severe constipation. (Patient not taking: Reported on 05/06/2024) 133 mL 0 Not Taking   TRELEGY ELLIPTA 100-62.5-25 MCG/ACT AEPB Inhale 1 puff into the lungs daily. (Patient not taking: Reported on 05/06/2024)   Not Taking   vitamin B-12 (CYANOCOBALAMIN ) 1000 MCG tablet Take 1 tablet (1,000 mcg total) by mouth daily. (Patient not taking: Reported on 05/06/2024) 30  tablet 0 Not Taking   Social History   Socioeconomic History   Marital status: Divorced    Spouse name: Not on file   Number of children: Not on file   Years of education: Not on file   Highest education level: Not on file  Occupational History   Not on file  Tobacco Use   Smoking status: Former    Current packs/day: 0.00    Types: Cigarettes    Quit date: 105    Years since quitting: 32.4   Smokeless tobacco: Current    Types: Snuff   Tobacco comments:    changed to dip  Vaping Use   Vaping status: Never Used  Substance and Sexual Activity   Alcohol  use: Yes    Alcohol /week: 26.0 standard drinks of alcohol     Types: 26 Cans of beer per week   Drug use: No   Sexual activity: Yes    Birth control/protection: None  Other Topics Concern   Not on file  Social History Narrative   Not on file  Social Drivers of Corporate investment banker Strain: Not on file  Food Insecurity: No Food Insecurity (05/07/2024)   Hunger Vital Sign    Worried About Running Out of Food in the Last Year: Never true    Ran Out of Food in the Last Year: Never true  Transportation Needs: No Transportation Needs (05/07/2024)   PRAPARE - Administrator, Civil Service (Medical): No    Lack of Transportation (Non-Medical): No  Physical Activity: Not on file  Stress: Not on file  Social Connections: Socially Isolated (05/07/2024)   Social Connection and Isolation Panel [NHANES]    Frequency of Communication with Friends and Family: Never    Frequency of Social Gatherings with Friends and Family: Never    Attends Religious Services: Never    Database administrator or Organizations: No    Attends Banker Meetings: Never    Marital Status: Divorced  Catering manager Violence: Not At Risk (05/07/2024)   Humiliation, Afraid, Rape, and Kick questionnaire    Fear of Current or Ex-Partner: No    Emotionally Abused: No    Physically Abused: No    Sexually Abused: No    Family  History  Problem Relation Age of Onset   Pneumonia Mother      Vitals:   05/08/24 1445 05/08/24 1509 05/08/24 1526 05/08/24 1604  BP: (!) 85/62  106/70 106/68  Pulse: 97 90 91 88  Resp: 18 20 (!) 22 18  Temp:    97.8 F (36.6 C)  TempSrc:    Oral  SpO2: 95% 94% 93% 96%  Weight:      Height:        PHYSICAL EXAM General: Chronically ill-appearing elderly male, well nourished, in no acute distress. HEENT: Normocephalic and atraumatic. Neck: No JVD.   Lungs: Normal respiratory effort on room air. bilaterally diminished. Heart: HRRR. Normal S1 and S2 without gallops, + murmur  Abdomen: Non-distended appearing.  Msk: Normal strength and tone for age. Extremities: Warm and well perfused. No clubbing, cyanosis. No edema.  Neuro: Alert and oriented X 3. Psych: Answers questions appropriately.   Labs: Basic Metabolic Panel: Recent Labs    05/07/24 0456 05/08/24 0559  NA 139 139  K 3.9 4.5  CL 105 106  CO2 26 28  GLUCOSE 98 89  BUN 17 11  CREATININE 0.93 0.94  CALCIUM  8.3* 8.4*  MG  --  2.1  PHOS  --  3.8   Liver Function Tests: Recent Labs    05/06/24 1812  AST 34  ALT 27  ALKPHOS 61  BILITOT 1.0  PROT 7.1  ALBUMIN 3.1*   No results for input(s): "LIPASE", "AMYLASE" in the last 72 hours. CBC: Recent Labs    05/06/24 1812 05/06/24 2226 05/07/24 0456 05/07/24 1027 05/08/24 0559 05/08/24 1505  WBC 12.7*  --  9.4  --  10.9*  --   NEUTROABS 8.9*  --   --   --   --   --   HGB 8.3*   < > 6.7*   < > 7.8* 8.1*  HCT 27.0*   < > 21.3*   < > 24.5* 25.8*  MCV 87.7  --  86.9  --  88.4  --   PLT 314  --  207  --  247  --    < > = values in this interval not displayed.   Cardiac Enzymes: Recent Labs    05/06/24 1812 05/06/24 2027 05/08/24 1505  TROPONINIHS 15 15 184*   BNP: No results for input(s): "BNP" in the last 72 hours. D-Dimer: No results for input(s): "DDIMER" in the last 72 hours. Hemoglobin A1C: No results for input(s): "HGBA1C" in the last 72  hours. Fasting Lipid Panel: No results for input(s): "CHOL", "HDL", "LDLCALC", "TRIG", "CHOLHDL", "LDLDIRECT" in the last 72 hours. Thyroid Function Tests: No results for input(s): "TSH", "T4TOTAL", "T3FREE", "THYROIDAB" in the last 72 hours.  Invalid input(s): "FREET3" Anemia Panel: Recent Labs    05/07/24 1027  VITAMINB12 937*  FOLATE 7.0  TIBC 330  IRON  42*     Radiology: No results found.  ECHO ordered  TELEMETRY reviewed by me 05/08/2024: Sinus tachycardia rate 100s  EKG reviewed by me: Sinus tachycardia, without significant ischemic changes  Data reviewed by me 05/08/2024: last 24h vitals tele labs imaging I/O ED provider note, admission H&P.  Principal Problem:   GI bleeding Active Problems:   Dyslipidemia   Depression   Chronic obstructive pulmonary disease (COPD) (HCC)    ASSESSMENT AND PLAN:  [name] is a 63 y.o. male  with a past medical history of CAD s/p CABG x 3 (04/2012), chronic HFpEF, moderate to severe aortic stenosis, hypertension, hyperlipidemia history of CVA, alcohol  use who presented to the ED on 05/06/2024 for epigastric pain and dark stools.  Patient underwent colonoscopy due to found melena with AVMs on capsule endoscopy.  After colonoscopy patient chest pain that radiates to bilateral shoulders.  Troponin found to be elevated.  Cardiology was consulted for further evaluation.   # NSTEMI # CAD s/p CABG (2013) # Chronic HFpEF # Moderate to severe aortic stenosis # COPD # History CVA Patient presents to the ED with epigastric pain and melena.  Found to have a GI bleed and underwent colonoscopy today (05/20). Patient received transfusion on 5/21 due to hemoglobin of 6.7, today Hgb 8.1.  BP borderline -Echo ordered.  Further recommendations pending results. -Continue to trend troponins until peaked. -Continue to monitor H&H -Defer heparin  this time due to recent GI bleed and low hemoglobin count.  -Patient reports not taking ASA at home. Will  defer starting at this time due to ow Hgb. -Continue pravastatin  20 mg daily. -Consider starting beta-blocker, ACE/ARB when BP stabilizes.    TIMI Risk Score for Unstable Angina or Non-ST Elevation MI:   The patient's TIMI risk score is 4, which indicates a 20% risk of all cause mortality, new or recurrent myocardial infarction or need for urgent revascularization in the next 14 days.    This patient's plan of care was discussed and created with Dr. Beau Bound and he is in agreement.  Signed: Creighton Doffing, PA-C  05/08/2024, 5:49 PM [facility]

## 2024-05-08 NOTE — Progress Notes (Addendum)
 \ Interval Progress Note  Patient: [name] UJW:[id number] DOB: [birth year] DOA: 05/06/2024     2 DOS: the patient was seen and examined on 05/08/2024   Case discussed with [name], with request to evaluate patient at bedside.   Discussed with rapid response RN at bedside.  Patient was admitted for GI bleed s/p 1 unit of packed RBC, for which she underwent colonoscopy earlier this afternoon.  Postoperatively, he had been experiencing intermittent left-sided chest pain with radiation to the left shoulder, in addition to intermittent shortness of breath.  Workup earlier today included serial troponins with evidence of downtrend from 184 >100.  Stable hemoglobin noted at 8.1. Chest ray with no active disease.  Cardiology was also consulted.  Nitroglycerin was administered x 3 with no significant alleviation of chest pain.  Then at approximately 6-6:30 PM, patient had recurrence of symptoms and rapid response was called.  On RNs evaluation, patient was tachypneic, tachycardic, and diaphoretic.  On personal discussion, [name] tells me that he is feeling short of breath and continues to have left-sided chest pain with radiation to his shoulders.  He also endorses abdominal pain but states this is relatively unchanged compared to when he came in.  He endorses distention and notes he has not passed much gas since his colonoscopy.  Objective: Vitals: HR in the 130s. RR in the 30s. BP: 114/78  Physical Exam Vitals and nursing note reviewed.  Constitutional:      Appearance: He is not toxic-appearing or diaphoretic.  HENT:     Head: Normocephalic and atraumatic.     Mouth/Throat:     Mouth: Mucous membranes are moist.     Pharynx: Oropharynx is clear.  Cardiovascular:     Rate and Rhythm: Regular rhythm. Tachycardia present.     Heart sounds: Murmur (systolic, 3/6, heard throughout) heard.  Pulmonary:     Effort: Tachypnea present. No accessory muscle usage or respiratory distress.      Comments: Diminished breath sounds on the left base only.  No other wheezing, rales or rhonchi Abdominal:     General: There is distension (Nonrigid.).     Palpations: Abdomen is soft.     Tenderness: There is abdominal tenderness (Diffuse). There is no guarding.  Musculoskeletal:     Right lower leg: No edema.     Left lower leg: No edema.  Skin:    General: Skin is warm and dry.  Neurological:     General: No focal deficit present.     Mental Status: He is alert and oriented to person, place, and time.    Assessment/Plan:  # Sinus Tachycardia # Atypical Chest Pain # Demand Ischemia # Shortness of Breathe  On admission and intermittently since colonoscopy, patient has been complaining of left-sided chest pain with radiation to the shoulders, that was nonresponsive to nitroglycerin, so atypical nature.  Initial troponin raised at 184 (15 on admission) with downtrend without evidence of ischemia on EKG, consistent with demand ischemia.    Unclear etiology at this time as he does not appear markedly hypervolemic.  He is afebrile.  No wheezing on examination to suggest COPD exacerbation.  CBC obtained and demonstrating stable hemoglobin.  No electrolyte abnormalities on CMP.  At this point, differential includes PE (less likely), aspiration event in the periop time, hypovolemia, ?symptomatic aortic stenosis 2/2 sedation.   - Additional troponin pending - Check BNP - Repeat chest x-ray - Gentle IV fluids - DuoNebs as needed - Echocardiogram ordered earlier today by  cardiology - Could trial low-dose Ativan  - Signed out to night team  Time spent: 40 minutes  Author: Avi Body, MD 05/08/2024 7:40 PM  For on call review [email or web address].

## 2024-05-08 NOTE — TOC Progression Note (Signed)
 Transition of Care [facility]) - Progression Note    Patient Details  Name: [name] MRN: [id number] Date of Birth: [birth year]  [facility]) CM/SW Contact  Loman Risk, RN Phone Number: 05/08/2024, 10:29 AM  Clinical Narrative:     Plan for colonoscopy today Social isolation resources added to AVS per SDOH screening       Expected Discharge Plan and Services                                               Social Determinants of Health (SDOH) Interventions SDOH Screenings   Food Insecurity: No Food Insecurity (05/07/2024)  Housing: Low Risk  (05/07/2024)  Transportation Needs: No Transportation Needs (05/07/2024)  Utilities: Not At Risk (05/07/2024)  Social Connections: Socially Isolated (05/07/2024)  Tobacco Use: High Risk (10/18/2023)   Received from [facility]    Readmission Risk Interventions    10/27/2022   11:28 AM  Readmission Risk Prevention Plan  Transportation Screening Complete  PCP or Specialist Appt within 5-7 Days Complete  Home Care Screening Complete  Medication Review (RN CM) Complete

## 2024-05-08 NOTE — Discharge Instructions (Signed)
 Do you feel isolated?  [facility] offers a [employer] that anyone can call toll free at [phone]. The friendship line is available 24 hours a day  [employer] is a Program of All-inclusive Care for the Elderly (PACE). Their mission is to promote and sustain the independence of seniors wishing to remain in the community. They provide seniors with comprehensive long-term health, social, medical and dietary care. Their program is a safe alternative to nursing home care. [phone]  [facility] Physical Address [facility] [address] [place], [place] [address] Phone: [phone]. . Online zoom yoga class, connect with others without leaving your home [facility] offers [place] dance cardio sessions for individuals via Zoom. This program provides: - Dance fitness activities Please contact program for more information. Servinganyone in need adults 18+ hiv/aids individuals families Call [phone]  Email [email or web address] to get more info  [employer] offers an online [employer] to individuals where they can receive help to focus on their best health. Whether you're a [employer] member or not, the neighborhood center offers a... Main Serviceshealth education  exercise & fitness  community support services  recreation  virtual support Other Servicessupport groups Servinganyone in need adults young adults teens seniors individuals families [email or web address] to get more info  Schedule on their website  The [facility] offers an array of activities for adults age 13 and over. This program provides:- Fitness and health programs- Tech classes- Activity books Main Serviceshealth education  community support services  exercise & fitness  recreation  more education Servingseniors  Call [phone]    For more resources go online to [email or web address] and type in you zipcode

## 2024-05-08 NOTE — Op Note (Signed)
 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 Procedure:             Pre-Anesthesia Assessment:                        - Prior to the procedure, a History and Physical was                         performed, and patient medications and allergies were                         reviewed. The patient's tolerance of previous                         anesthesia was also reviewed. The risks and benefits                         of the procedure and the sedation options and risks                         were discussed with the patient. All questions were                         answered, and informed consent was obtained. Prior                         Anticoagulants: The patient has taken no anticoagulant                         or antiplatelet agents. ASA Grade Assessment: III - A                         patient with severe systemic disease. After reviewing                         the risks and benefits, the patient was deemed in                         satisfactory condition to undergo the procedure.                        After obtaining informed consent, the colonoscope was  passed under direct vision. Throughout the procedure,                         the patient's blood pressure, pulse, and oxygen                         saturations were monitored continuously. The                         Colonoscope  was introduced through the anus and                         advanced to the the terminal ileum. The colonoscopy                         was performed without difficulty. The patient                         tolerated the procedure well. The quality of the bowel                         preparation was good. Findings:      The perianal and digital rectal examinations were normal.      Non-bleeding internal hemorrhoids were found during retroflexion. The       hemorrhoids were Grade II (internal hemorrhoids that prolapse but reduce       spontaneously).      The terminal ileum appeared normal. No bloo dor AVM's seen. Impression:            - Non-bleeding internal hemorrhoids.                        - The examined portion of the ileum was normal.                        - No specimens collected. Recommendation:        - Return patient to hospital ward for ongoing care.                        - Clear liquid diet.                        - Continue present medications.                        - If any further bleeding then consider red tagged                         bleeding scan and/or IR for embolization. Procedure Code(s):     --- Professional ---                        [phone], Colonoscopy, flexible; diagnostic, including                         collection of specimen(s) by brushing or washing, when                         performed (separate procedure) Diagnosis Code(s):     --- Professional ---  K92.1, Melena (includes Hematochezia) CPT copyright 2022 American Medical Association. All rights reserved. The codes documented in this report are preliminary and upon coder review may  be revised to meet current compliance requirements. Marnee Sink MD, MD 05/08/2024 2:17:11 PM This report has been signed electronically. Number of Addenda: 0 Note Initiated On: 05/08/2024 1:12 PM Scope Withdrawal Time: 0 hours 9 minutes 29 seconds  Total Procedure Duration: 0 hours 22 minutes  25 seconds  Estimated Blood Loss:  Estimated blood loss: none.      [facility]

## 2024-05-09 ENCOUNTER — Inpatient Hospital Stay: Admit: 2024-05-09

## 2024-05-09 ENCOUNTER — Inpatient Hospital Stay

## 2024-05-09 ENCOUNTER — Encounter: Payer: Self-pay | Admitting: Gastroenterology

## 2024-05-09 DIAGNOSIS — J690 Pneumonitis due to inhalation of food and vomit: Secondary | ICD-10-CM | POA: Diagnosis not present

## 2024-05-09 LAB — TROPONIN I (HIGH SENSITIVITY)
Troponin I (High Sensitivity): 717 ng/L (ref <18)
Troponin I (High Sensitivity): 1176 ng/L (ref <18)
Troponin I (High Sensitivity): 677 ng/L (ref <18)
Troponin I (High Sensitivity): 1167 ng/L (ref <18)

## 2024-05-09 LAB — BASIC METABOLIC PANEL WITH GFR
Anion gap: 9 (ref 5–15)
BUN: 11 mg/dL (ref 8–23)
CO2: 23 mmol/L (ref 22–32)
Calcium: 8.7 mg/dL — ABNORMAL LOW (ref 8.9–10.3)
Chloride: 103 mmol/L (ref 98–111)
Creatinine, Ser: 0.87 mg/dL (ref 0.61–1.24)
GFR, Estimated: >60 mL/min (ref >60)
Glucose, Bld: 161 mg/dL — ABNORMAL HIGH (ref 70–99)
Potassium: 4 mmol/L (ref 3.5–5.1)
Sodium: 135 mmol/L (ref 135–145)

## 2024-05-09 LAB — CBC
HCT: 25.2 % — ABNORMAL LOW (ref 39.0–52.0)
Hemoglobin: 8 g/dL — ABNORMAL LOW (ref 13.0–17.0)
MCH: 28.1 pg (ref 26.0–34.0)
MCHC: 31.7 g/dL (ref 30.0–36.0)
MCV: 88.4 fL (ref 80.0–100.0)
Platelets: 249 10*3/uL (ref 150–400)
RBC: 2.85 MIL/uL — ABNORMAL LOW (ref 4.22–5.81)
RDW: 20.1 % — ABNORMAL HIGH (ref 11.5–15.5)
WBC: 19 10*3/uL — ABNORMAL HIGH (ref 4.0–10.5)
nRBC: 0 % (ref 0.0–0.2)

## 2024-05-09 LAB — PHOSPHORUS: Phosphorus: 4.4 mg/dL (ref 2.5–4.6)

## 2024-05-09 LAB — MAGNESIUM: Magnesium: 1.7 mg/dL (ref 1.7–2.4)

## 2024-05-09 MED ORDER — METOPROLOL TARTRATE 25 MG PO TABS
25.0000 mg | ORAL_TABLET | Freq: Two times a day (BID) | ORAL | Status: DC
  Administered 2024-05-09 – 2024-05-10 (×2): 25 mg via ORAL
  Filled 2024-05-09 (×3): qty 1

## 2024-05-09 MED ORDER — MAGNESIUM SULFATE 2 GM/50ML IV SOLN
2.0000 g | Freq: Once | INTRAVENOUS | Status: AC
  Administered 2024-05-09: 2 g via INTRAVENOUS
  Filled 2024-05-09: qty 50

## 2024-05-09 MED ORDER — SODIUM CHLORIDE 0.9 % IV SOLN
500.0000 mg | INTRAVENOUS | Status: AC
  Administered 2024-05-09 – 2024-05-13 (×5): 500 mg via INTRAVENOUS
  Filled 2024-05-09 (×5): qty 5

## 2024-05-09 MED ORDER — IOHEXOL 350 MG/ML SOLN
75.0000 mL | Freq: Once | INTRAVENOUS | Status: AC | PRN
  Administered 2024-05-09: 75 mL via INTRAVENOUS

## 2024-05-09 MED ORDER — IOHEXOL 350 MG/ML SOLN: 100.0000 mL | Freq: Once | INTRAVENOUS | Status: DC | PRN

## 2024-05-09 MED ORDER — ISOSORBIDE MONONITRATE ER 30 MG PO TB24
30.0000 mg | ORAL_TABLET | Freq: Every day | ORAL | Status: DC
  Administered 2024-05-09 – 2024-05-13 (×5): 30 mg via ORAL
  Filled 2024-05-09 (×5): qty 1

## 2024-05-09 MED ORDER — PIPERACILLIN-TAZOBACTAM 3.375 G IVPB
3.3750 g | Freq: Three times a day (TID) | INTRAVENOUS | Status: AC
  Administered 2024-05-09 – 2024-05-13 (×15): 3.375 g via INTRAVENOUS
  Filled 2024-05-09 (×16): qty 50

## 2024-05-09 MED ORDER — SODIUM CHLORIDE 0.9 % IV SOLN
2.0000 g | INTRAVENOUS | Status: DC
  Administered 2024-05-09: 2 g via INTRAVENOUS
  Filled 2024-05-09: qty 20

## 2024-05-09 NOTE — TOC Progression Note (Signed)
 Transition of Care [facility]) - Progression Note    Patient Details  Name: [name] MRN: [id number] Date of Birth: [birth year]  Transition of Care [facility]) CM/SW Contact  [place] L Fitz [place], [place] Phone Number: 05/09/2024, 12:07 PM  Clinical Narrative:     CSW met with pt. Pt advised that he is doing well. Chart reviewed. Pt has no TOC needs at this time.        Expected Discharge Plan and Services                                               Social Determinants of Health (SDOH) Interventions SDOH Screenings   Food Insecurity: No Food Insecurity (05/07/2024)  Housing: Low Risk  (05/07/2024)  Transportation Needs: No Transportation Needs (05/07/2024)  Utilities: Not At Risk (05/07/2024)  Social Connections: Socially Isolated (05/07/2024)  Tobacco Use: High Risk (10/18/2023)   Received from [facility]    Readmission Risk Interventions    10/27/2022   11:28 AM  Readmission Risk Prevention Plan  Transportation Screening Complete  PCP or Specialist Appt within 5-7 Days Complete  Home Care Screening Complete  Medication Review (RN CM) Complete

## 2024-05-09 NOTE — Plan of Care (Signed)
   Problem: Education: Goal: Knowledge of General Education information will improve Description Including pain rating scale, medication(s)/side effects and non-pharmacologic comfort measures Outcome: Progressing   Problem: Clinical Measurements: Goal: Ability to maintain clinical measurements within normal limits will improve Outcome: Progressing   Problem: Activity: Goal: Risk for activity intolerance will decrease Outcome: Progressing

## 2024-05-09 NOTE — Progress Notes (Signed)
 CROSS COVER NOTE  NAME: [name] MRN: [id number] DOB : 08-19-1961    Concern as stated by nurse / staff   Received signout from Dr. Guss Legacy about her rapid response just prior to shift change.  Patient stable at the time of signout  Later on nurses report that patient having episodic dyspnea associated with tachycardia to the 130s     Pertinent findings on chart review: Last progress note as well as rapid response notes reviewed along with possible differentials: Briefly, patient admitted with GI bleed and underwent colonoscopy earlier in the day following which he had episodic chest pain associated with dyspnea, subsequently evaluated by cardiology.  Rapid response was called due to a severe episode of chest pain.  Subsequent workup was reassuring except for mildly elevated troponin.  Hemoglobin was at stable  Patient assessment Patient lying comfortably, currently not having chest pain  Physical Exam Vitals and nursing note reviewed.  Constitutional:      General: He is not in acute distress. HENT:     Head: Normocephalic and atraumatic.  Cardiovascular:     Rate and Rhythm: Regular rhythm. Tachycardia present.     Heart sounds: Normal heart sounds.  Pulmonary:     Effort: Pulmonary effort is normal.     Breath sounds: Normal breath sounds.  Abdominal:     Palpations: Abdomen is soft.     Tenderness: There is no abdominal tenderness.  Neurological:     Mental Status: Mental status is at baseline.        05/09/2024    2:15 AM 05/09/2024    2:00 AM 05/09/2024    1:45 AM  Vitals with BMI  Systolic  111   Diastolic  88   Pulse 97 102 106   Cardiac Panel (last 3 results) Recent Labs    05/08/24 2119 05/08/24 2206 05/09/24 0038  TROPONINIHS 263* 338* 717*      Latest Ref Rng & Units 05/08/2024    7:16 PM 05/08/2024    3:05 PM 05/08/2024    5:59 AM  CBC  WBC 4.0 - 10.5 K/uL 17.8   10.9   Hemoglobin 13.0 - 17.0 g/dL 8.2  8.1  7.8   Hematocrit 39.0 -  52.0 % 26.4  25.8  24.5   Platelets 150 - 400 K/uL 262   247    D dimer negative  BNP>600   CXR IMPRESSION: 1. Interval development of patchy left parahilar airspace disease, suspicious for pneumonia. 2. Hyperexpansion with chronic interstitial coarsening  Assessment and  Interventions   Assessment:  Exertional dyspnea--> pneumonia on chest x-ray  Question whether patient may have aspirated - Differential included symptomatic anemia, Fluid overload related to transfusions, Possible ACS -Low suspicion for PE as D-dimer negative  Plan:   Will get CXR-->PNA --> started on Rocephin  and azithromycin , DuoNebs as needed, supplemental oxygen as needed Can consider transfusing another unit given patient still symptomatic with hemoglobin of 8 Continuous cardiac monitoring Cardiology was consulted earlier and will continue to evaluate Echocardiogram already ordered for in the a.m. to evaluate for ACS      CRITICAL CARE Performed by: Lanetta Pion   Total critical care time: 100 minutes  Critical care time was exclusive of separately billable procedures and treating other patients.  Critical care was necessary to treat or prevent imminent or life-threatening deterioration.  Critical care was time spent personally by me on the following activities: development of treatment plan with patient and/or surrogate  as well as nursing, discussions with consultants, evaluation of patient's response to treatment, examination of patient, obtaining history from patient or surrogate, ordering and performing treatments and interventions, ordering and review of laboratory studies, ordering and review of radiographic studies, pulse oximetry and re-evaluation of patient's condition.

## 2024-05-09 NOTE — Plan of Care (Signed)
  Problem: Health Behavior/Discharge Planning: Goal: Ability to manage health-related needs will improve Outcome: Progressing   Problem: Clinical Measurements: Goal: Ability to maintain clinical measurements within normal limits will improve Outcome: Progressing Goal: Diagnostic test results will improve Outcome: Progressing Goal: Cardiovascular complication will be avoided Outcome: Progressing   Problem: Clinical Measurements: Goal: Respiratory complications will improve Outcome: Not Progressing   Problem: Activity: Goal: Risk for activity intolerance will decrease Outcome: Not Progressing

## 2024-05-09 NOTE — Progress Notes (Signed)
 Pt requesting food at this time and was given a sandwich tray and drink. Pt A&Ox3-4. Clear speech, no concerns posed to RN at this time from patient. Pt readjusted in bed.

## 2024-05-09 NOTE — Progress Notes (Addendum)
 [facility] CLINIC CARDIOLOGY PROGRESS NOTE       Patient ID: [name] MRN: [id number] DOB/AGE: 01/04/1961 63 y.o.  Admit date: 05/06/2024 Referring Physician Dr. Althia Atlas  Primary Physician Evette Hoes, NP Primary Cardiologist Dr. Parks Bollman (last seen 2024) Reason for Consultation chest pain  HPI: [name] is a 63 y.o. male  with a past medical history of CAD s/p CABG x 3 (04/2012), chronic HFpEF, moderate to severe aortic stenosis, hypertension, hyperlipidemia history of CVA, alcohol  use who presented to the ED on 05/06/2024 for epigastric pain and dark stools.  Patient underwent colonoscopy due to found melena with AVMs on capsule endoscopy.  After colonoscopy patient chest pain that radiates to bilateral shoulders.  Troponin found to be elevated.  Cardiology was consulted for further evaluation.   Patient states he has had Douglas chest pain for the past month also had a syncopal event at home and never seen a provider regarding the symptoms.  Patient unable to describe his chest pain.  States his chest pain radiates to his throat and shoulders.  Patient endorses dyspnea with mild exertion.  Patient's chest pain-free on 05/23 s/p multiple doses of morphine  and nitroglycerin.  Interval History: -Rapid response was called last night due to 10 out of 10 chest pain that radiated to the shoulders.  Patient transferred to ICU.  Patient received multiple doses of morphine  and nitroglycerin.  Chest x-ray revealed pneumonia patient started on IV antibiotics. -Patient seen and examined this afternoon and laying comfortably in hospital bed. Patient states he feels a lot better this AM and denies any recurrence of chest pain since this morning.  Patient having dyspnea with mild exertion. -Patients BP and HR  elevated. Per tele patient in sinus tachycardia, rate 100s -Patient remains on 2L with stable SpO2.     Review of systems complete and found to be negative unless listed above     Past Medical History:  Diagnosis Date   COPD (chronic obstructive pulmonary disease) (HCC)    CVA (cerebral infarction)    Headache    mild, since stroke 2012   Hypertension    MI (myocardial infarction) (HCC) 05/09/2012   Reading difficulty    pt reports he reads at "about a second grade level"   Stroke [facility] 2012   numbness to left hand    Wears dentures    full upper and lower    Past Surgical History:  Procedure Laterality Date   CAROTID ENDARTERECTOMY Left    2012 or 2013   CHOLECYSTECTOMY N/A 07/26/2016   Procedure: LAPAROSCOPIC CHOLECYSTECTOMY;  Surgeon: Claudia Cuff, MD;  Location: [facility] ORS;  Service: General;  Laterality: N/A;   COLONOSCOPY N/A 05/08/2024   Procedure: COLONOSCOPY;  Surgeon: Marnee Sink, MD;  Location: [facility] ENDOSCOPY;  Service: Endoscopy;  Laterality: N/A;   COLONOSCOPY WITH PROPOFOL  N/A 02/09/2022   Procedure: COLONOSCOPY WITH PROPOFOL ;  Surgeon: Marnee Sink, MD;  Location: [facility] ENDOSCOPY;  Service: Endoscopy;  Laterality: N/A;   CORONARY ARTERY BYPASS GRAFT  05/10/2012   3 vessel   ENTEROSCOPY N/A 05/16/2023   Procedure: ENTEROSCOPY;  Surgeon: Luke Salaam, MD;  Location: [facility] ENDOSCOPY;  Service: Gastroenterology;  Laterality: N/A;   ENTEROSCOPY N/A 06/01/2023   Procedure: ENTEROSCOPY;  Surgeon: Selena Daily, MD;  Location: [facility] ENDOSCOPY;  Service: Gastroenterology;  Laterality: N/A;   ESOPHAGOGASTRODUODENOSCOPY  05/13/2023   Procedure: ESOPHAGOGASTRODUODENOSCOPY (EGD);  Surgeon: Marnee Sink, MD;  Location: [facility] ENDOSCOPY;  Service: Endoscopy;;   ESOPHAGOGASTRODUODENOSCOPY (EGD) WITH PROPOFOL  N/A 10/28/2022  Procedure: ESOPHAGOGASTRODUODENOSCOPY (EGD) WITH PROPOFOL ;  Surgeon: Quintin Buckle, DO;  Location: [facility] ENDOSCOPY;  Service: Gastroenterology;  Laterality: N/A;   GIVENS CAPSULE STUDY  05/13/2023   Procedure: GIVENS CAPSULE STUDY;  Surgeon: Marnee Sink, MD;  Location: [facility] ENDOSCOPY;  Service: Endoscopy;;   GIVENS CAPSULE  STUDY  06/01/2023   Procedure: GIVENS CAPSULE STUDY;  Surgeon: Selena Daily, MD;  Location: [facility] ENDOSCOPY;  Service: Gastroenterology;;    Medications Prior to Admission  Medication Sig Dispense Refill Last Dose/Taking   albuterol  (VENTOLIN  HFA) 108 (90 Base) MCG/ACT inhaler Inhale 2 puffs into the lungs every 6 (six) hours as needed for wheezing or shortness of breath. (Patient not taking: Reported on 10/16/2023) 8 g 2    aspirin  EC (ASPIRIN  LOW DOSE) 81 MG tablet Take 81 mg by mouth daily. (Patient not taking: Reported on 05/06/2024)   Not Taking   cetirizine (ZYRTEC) 10 MG tablet Take 10 mg by mouth daily. (Patient not taking: Reported on 05/06/2024)   Not Taking   donepezil  (ARICEPT ) 10 MG tablet Take 10 mg by mouth daily.  (Patient not taking: Reported on 05/06/2024)   Not Taking   escitalopram  (LEXAPRO ) 5 MG tablet Take 5 mg by mouth daily. (Patient not taking: Reported on 05/06/2024)   Not Taking   fluticasone  (FLONASE ) 50 MCG/ACT nasal spray Place 2 sprays into both nostrils daily. (Patient not taking: Reported on 05/06/2024)   Not Taking   Melatonin 10 MG CAPS Take 1 capsule by mouth at bedtime. (Patient not taking: Reported on 05/06/2024)   Not Taking   naltrexone  (DEPADE) 50 MG tablet Take 50 mg by mouth daily. (Patient not taking: Reported on 05/06/2024)   Not Taking   omeprazole  (PRILOSEC) 20 MG capsule Take 1 capsule (20 mg total) by mouth 2 (two) times daily before a meal. (Patient not taking: Reported on 05/06/2024) 60 capsule 0 Not Taking   polyethylene glycol (MIRALAX ) 17 g packet Take 17 g by mouth daily. (Patient not taking: Reported on 05/06/2024) 14 each 0 Not Taking   pravastatin  (PRAVACHOL ) 20 MG tablet Take 20 mg by mouth at bedtime.  (Patient not taking: Reported on 05/06/2024)   Not Taking   sodium phosphate (FLEET) 7-19 GM/118ML ENEM Place 133 mLs (1 enema total) rectally once as needed for up to 1 dose for severe constipation. (Patient not taking: Reported on 05/06/2024) 133  mL 0 Not Taking   TRELEGY ELLIPTA 100-62.5-25 MCG/ACT AEPB Inhale 1 puff into the lungs daily. (Patient not taking: Reported on 05/06/2024)   Not Taking   vitamin B-12 (CYANOCOBALAMIN ) 1000 MCG tablet Take 1 tablet (1,000 mcg total) by mouth daily. (Patient not taking: Reported on 05/06/2024) 30 tablet 0 Not Taking   Social History   Socioeconomic History   Marital status: Divorced    Spouse name: Not on file   Number of children: Not on file   Years of education: Not on file   Highest education level: Not on file  Occupational History   Not on file  Tobacco Use   Smoking status: Former    Current packs/day: 0.00    Types: Cigarettes    Quit date: 6    Years since quitting: 32.4   Smokeless tobacco: Current    Types: Snuff   Tobacco comments:    changed to dip  Vaping Use   Vaping status: Never Used  Substance and Sexual Activity   Alcohol  use: Yes    Alcohol /week: 26.0 standard drinks of alcohol   Types: 26 Cans of beer per week   Drug use: No   Sexual activity: Yes    Birth control/protection: None  Other Topics Concern   Not on file  Social History Narrative   Not on file   Social Drivers of Health   Financial Resource Strain: Not on file  Food Insecurity: No Food Insecurity (05/07/2024)   Hunger Vital Sign    Worried About Running Out of Food in the Last Year: Never true    Ran Out of Food in the Last Year: Never true  Transportation Needs: No Transportation Needs (05/07/2024)   PRAPARE - Administrator, Civil Service (Medical): No    Lack of Transportation (Non-Medical): No  Physical Activity: Not on file  Stress: Not on file  Social Connections: Socially Isolated (05/07/2024)   Social Connection and Isolation Panel [NHANES]    Frequency of Communication with Friends and Family: Never    Frequency of Social Gatherings with Friends and Family: Never    Attends Religious Services: Never    Database administrator or Organizations: No    Attends  Banker Meetings: Never    Marital Status: Divorced  Catering manager Violence: Not At Risk (05/07/2024)   Humiliation, Afraid, Rape, and Kick questionnaire    Fear of Current or Ex-Partner: No    Emotionally Abused: No    Physically Abused: No    Sexually Abused: No    Family History  Problem Relation Age of Onset   Pneumonia Mother      Vitals:   05/09/24 0215 05/09/24 0400 05/09/24 0430 05/09/24 1331  BP:   (!) 154/142   Pulse: 97  (!) 126   Resp: 14  20   Temp:  98.3 F (36.8 C)    TempSrc:      SpO2: 100%  98% 100%  Weight:      Height:        PHYSICAL EXAM General: Chronically ill-appearing elderly male, well nourished, in no acute distress. HEENT: Normocephalic and atraumatic. Neck: No JVD.   Lungs: Normal respiratory effort on room air. Bilaterally diminished. Heart: HRRR. Normal S1 and S2 without gallops, + murmur  Abdomen: Non-distended appearing.  Msk: Normal strength and tone for age. Extremities: Warm and well perfused. No clubbing, cyanosis. No edema.  Neuro: Alert and oriented X 3. Psych: Answers questions appropriately.   Labs: Basic Metabolic Panel: Recent Labs    05/08/24 0559 05/08/24 1916 05/09/24 0314  NA 139 136 135  K 4.5 4.1 4.0  CL 106 103 103  CO2 28 22 23   GLUCOSE 89 235* 161*  BUN 11 12 11   CREATININE 0.94 1.03 0.87  CALCIUM  8.4* 8.6* 8.7*  MG 2.1  --  1.7  PHOS 3.8  --  4.4   Liver Function Tests: Recent Labs    05/06/24 1812 05/08/24 1916  AST 34 37  ALT 27 24  ALKPHOS 61 61  BILITOT 1.0 1.6*  PROT 7.1 6.9  ALBUMIN 3.1* 3.1*   No results for input(s): "LIPASE", "AMYLASE" in the last 72 hours. CBC: Recent Labs    05/06/24 1812 05/06/24 2226 05/08/24 1916 05/09/24 0314  WBC 12.7*   < > 17.8* 19.0*  NEUTROABS 8.9*  --   --   --   HGB 8.3*   < > 8.2* 8.0*  HCT 27.0*   < > 26.4* 25.2*  MCV 87.7   < > 91.0 88.4  PLT 314   < > 262  249   < > = values in this interval not displayed.   Cardiac  Enzymes: Recent Labs    05/09/24 0314 05/09/24 0800 05/09/24 0948  TROPONINIHS 677* 1,176* 1,167*   BNP: Recent Labs    05/08/24 1915  BNP 614.9*   D-Dimer: No results for input(s): "DDIMER" in the last 72 hours. Hemoglobin A1C: No results for input(s): "HGBA1C" in the last 72 hours. Fasting Lipid Panel: No results for input(s): "CHOL", "HDL", "LDLCALC", "TRIG", "CHOLHDL", "LDLDIRECT" in the last 72 hours. Thyroid Function Tests: No results for input(s): "TSH", "T4TOTAL", "T3FREE", "THYROIDAB" in the last 72 hours.  Invalid input(s): "FREET3" Anemia Panel: Recent Labs    05/07/24 1027  VITAMINB12 937*  FOLATE 7.0  TIBC 330  IRON  42*     Radiology: DG Chest Port 1 View Result Date: 05/09/2024 CLINICAL DATA:  Shortness of breath. EXAM: PORTABLE CHEST 1 VIEW COMPARISON:  05/08/2024 FINDINGS: Interval development of patchy left parahilar airspace disease, suspicious for pneumonia lungs are hyperexpanded. Interstitial markings are diffusely coarsened with chronic features. The cardio pericardial silhouette is enlarged. No acute bony abnormality. Telemetry leads overlie the chest. IMPRESSION: 1. Interval development of patchy left parahilar airspace disease, suspicious for pneumonia. 2. Hyperexpansion with chronic interstitial coarsening. Electronically Signed   By: Donnal Fusi M.D.   On: 05/09/2024 05:33   DG Chest Port 1 View Result Date: 05/08/2024 CLINICAL DATA:  Shortness of breath. EXAM: PORTABLE CHEST 1 VIEW COMPARISON:  May 08, 2024 (3:02 p.m.) FINDINGS: Multiple sternal wires are present. The heart size and mediastinal contours are within normal limits. Mild right apical linear scarring and/or atelectasis is seen. Mild left basilar atelectasis and/or infiltrate is also noted. This represents a new finding when compared to the earlier study. No pleural effusion or pneumothorax is seen. The visualized skeletal structures are unremarkable. IMPRESSION: 1. Evidence of prior  median sternotomy. 2. Mild left basilar atelectasis and/or infiltrate. 3. Mild right apical linear scarring and/or atelectasis. Electronically Signed   By: Virgle Grime M.D.   On: 05/08/2024 20:02   DG Chest Port 1 View Result Date: 05/08/2024 CLINICAL DATA:  Shortness of breath. EXAM: PORTABLE CHEST 1 VIEW COMPARISON:  06/07/2023 FINDINGS: Prior median sternotomy. Stable heart size and mediastinal contours. Chronic hyperinflation and bronchial thickening. No evidence of acute airspace disease. No pneumothorax or significant pleural effusion. Normal pulmonary vasculature. IMPRESSION: Chronic hyperinflation and bronchial thickening. No acute findings. Electronically Signed   By: Chadwick Colonel M.D.   On: 05/08/2024 18:22    ECHO ordered  TELEMETRY reviewed by me 05/09/2024: Sinus tachycardia rate 100s  EKG reviewed by me: Sinus tachycardia, without significant ischemic changes  Data reviewed by me 05/09/2024: last 24h vitals tele labs imaging I/O ED provider note, admission H&P.  Principal Problem:   GI bleeding Active Problems:   Dyslipidemia   Depression   Chronic obstructive pulmonary disease (COPD) (HCC)    ASSESSMENT AND PLAN:  [name] is a 63 y.o. male  with a past medical history of CAD s/p CABG x 3 (04/2012), chronic HFpEF, moderate to severe aortic stenosis, hypertension, hyperlipidemia history of CVA, alcohol  use who presented to the ED on 05/06/2024 for epigastric pain and dark stools.  Patient underwent colonoscopy due to found melena with AVMs on capsule endoscopy.  After colonoscopy patient chest pain that radiates to bilateral shoulders.  Troponin found to be elevated.  Cardiology was consulted for further evaluation.   # NSTEMI # CAD s/p CABG (2013) # Chronic HFpEF # Moderate  to severe aortic stenosis # COPD # History CVA Patient presents to the ED with epigastric pain and melena. Found to have a GI bleed and underwent colonoscopy today (05/20). Patient received  transfusion on 5/21 due to hemoglobin of 6.7, today Hgb 8.1. Trops elevated 200 > 300 > 700 > 600 > 1176 > 1167. EKG without acute ischemic changes.  -Echo ordered.  Further recommendations pending results. -CTA ordered. -Patient reports not taking ASA at home. Will defer starting at this time due to low Hgb. -Continue pravastatin  20 mg daily. -Ordered metoprolol  tartrate 25 mg twice daily. Uptitrate as BP allow. -Ordered Imdur 30 mg daily. Uptitrate as BP allow. -Consider starting ACE/ARB tomorrow if BP stabilizes.  -Continue to monitor H&H -Recommend heparin  due to patients significant cardiac history, cardiac symptoms and elevated troponins. 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 It was found on his last endoscopy and could not be reached to cauterize".  Anticoagulation would be patient at high risk for bleeding. Will defer heparin  and LHC at this time due to recent GI bleed and high risk for bleeding.   TIMI Risk Score for Unstable Angina or Non-ST Elevation MI:   The patient's TIMI risk score is 4, which indicates a 20% risk of all cause mortality, new or recurrent myocardial infarction or need for urgent revascularization in the next 14 days.    This patient's plan of care was discussed and created with Dr. Beau Bound and he is in agreement.  Signed: Creighton Doffing, PA-C  05/09/2024, 2:02 PM [facility] Cardiology

## 2024-05-09 NOTE — Progress Notes (Signed)
 [facility] Progress Note  Patient: [name]    WRU:[id number]  DOA: 05/06/2024     Date of Service: the patient was seen and examined on 05/09/2024  Chief Complaint  Patient presents with   Dizziness   Blood In Stools   Brief hospital course: [name] is a 63 y.o. Caucasian male with PMH of PMH of 3vCAD w/ hx inferior STEMI (2013) s/p PCI with stent RCA + CABG x 3 ([facility]), moderate-severe aortic stenosis (10/2022), HFpEF, HTN, COPD, CVA, who presented to the ED with c/o melena over the last few weeks.  No bright red bleeding, no nausea vomiting, no abdominal pain. C/o chest pain and bilateral shoulder ache which resolved.   ED Course: VS BP 92/77 HR 109 and RR 22  Labs: calcium  8.6 and albumin 3.1 with otherwise unremarkable CMP.  High-sensitivity troponin was 15 twice.   CBC: wbc 12.7 with neutrophilia and anemia with Hb 8.3  Blood glucose A+ with negative antibody screen. EKG as reviewed by me : EKG showed sinus tachycardia with a rate of 108 with biatrial enlargement. Imaging: None.   The patient was 20 mg of IV Pepcid .  He will be admitted to a medical telemetry bed for further evaluation and management.   Assessment and Plan:  # NSTEMI # 3vCAD w/ hx inferior STEMI (2013) s/p PCI with stent RCA + CABG x 3 ([facility]), moderate-severe aortic stenosis (10/2022), HFpEF, HTN Patient had chest pain on arrival and bilateral shoulder ache which resolved, tropes were negative. 5/22 again patient had chest pain and shoulder ache after colonoscopy S/p nitroglycerin x 3 doses given and due to respiratory failure and severe chest pain patient was transferred to stepdown unit Troponin peaked 1176  Cardiology consulted, started Lopressor  25 mg p.o. twice daily and Imdur 30 mg p.o. daily, not a good candidate for anticoagulation heparin  infusion due to high risk for bleeding  # Aspiration pneumonia Possible aspiration during colonoscopy CXR consistent with pneumonia Continue  azithromycin  and switch ceftriaxone  to Zosyn to cover anaerobes Continue supplemental O2 inhalation and gradually wean off  # Chronic obstructive pulmonary disease (COPD) - Continue his inhalers.   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  At that time it was recommended to approach the lesions via lower GI tract to find and cauterize. Continue pantoprazole  40 mg IV twice daily GI consulted, s/p colonoscopy, no source of bleeding identified.  GI recommended CTA and IR embolization of GI bleeding reoccurs.  Monitor H&H.   # Acute blood loss anemia due to GI bleeding as above 5/21, Hb 6.7, 1 unit of PRBC transfused 5/23 Hb 8.0, stable Monitor H&H and transfuse as needed   # Iron  deficiency, transferrin saturation 13% 5/21 Venofer 300 mg IV one-time dose given followed by oral supplement # Folate level 7.0 at lower end, started folic acid  1 mg p.o. daily # Vitamin D deficiency: started vitamin D 50,000 units p.o. weekly, follow with PCP to repeat vitamin D level after 3 to 6 months.   # Depression: Continue Lexapro . # Dyslipidemia: continue statin therapy.   Body mass index is 31.19 kg/m.  Interventions:   Diet: Heart healthy diet DVT Prophylaxis: SCD, pharmacological prophylaxis contraindicated due to GI bleeding   Advance goals of care discussion: Full code  Family Communication: family was not present at bedside, at the time of interview.  The pt provided permission to discuss medical plan  with the family. Opportunity was given to ask question and all questions were answered satisfactorily.   Disposition:  Pt is from home, admitted with GI bleeding, GI consulted, s/p colonoscopy on 5/22, which precludes a safe discharge. Discharge to home, when stable, most likely in 1 to 2 days.  Subjective: Late evening patient started having chest pain after colonoscopy  and then respiratory failure, patient was transferred to ICU.  In the stepdown unit.  Chest pain resolved, patient is complaining of some pressure in the abdomen and mild pressure in the center of the chest.  No any other active issues.  Patient did move bowels and passing gas. Diet was advanced, we will continue to monitor.   Physical Exam: General: NAD, lying comfortably Appear in no distress, affect appropriate Eyes: PERRLA ENT: Oral Mucosa Clear, moist  Neck: no JVD,  Cardiovascular: S1 and S2 Present, no Murmur,  Respiratory: good respiratory effort, Bilateral Air entry equal and Decreased, no Crackles, no wheezes Abdomen: Bowel Sound present, Soft, obese and no tenderness,  Skin: no rashes Extremities: no Pedal edema, no calf tenderness Neurologic: without any new focal findings Gait not checked due to patient safety concerns  Vitals:   05/09/24 1559 05/09/24 1600 05/09/24 1700 05/09/24 1722  BP: 123/83 120/70 115/72   Pulse: (!) 107 (!) 105 (!) 107 (!) 107  Resp:  18 18 17   Temp:    98.9 F (37.2 C)  TempSrc:    Oral  SpO2:  100% 100% 100%  Weight:      Height:        Intake/Output Summary (Last 24 hours) at 05/09/2024 1733 Last data filed at 05/09/2024 1521 Gross per 24 hour  Intake 1757.45 ml  Output 1400 ml  Net 357.45 ml   Filed Weights   05/06/24 1758  Weight: 104.3 kg    Data Reviewed: I have personally reviewed and interpreted daily labs, tele strips, imagings as discussed above. I reviewed all nursing notes, pharmacy notes, vitals, pertinent old records I have discussed plan of care as described above with RN and patient/family.  CBC: Recent Labs  Lab 05/06/24 1812 05/06/24 2226 05/07/24 0456 05/07/24 1027 05/07/24 2147 05/08/24 0559 05/08/24 1505 05/08/24 1916 05/09/24 0314  WBC 12.7*  --  9.4  --   --  10.9*  --  17.8* 19.0*  NEUTROABS 8.9*  --   --   --   --   --   --   --   --   HGB 8.3*   < > 6.7*   < > 8.5* 7.8* 8.1* 8.2* 8.0*  HCT  27.0*   < > 21.3*   < > 26.5* 24.5* 25.8* 26.4* 25.2*  MCV 87.7  --  86.9  --   --  88.4  --  91.0 88.4  PLT 314  --  207  --   --  247  --  262 249   < > = values in this interval not displayed.   Basic Metabolic Panel: Recent Labs  Lab 05/06/24 1812 05/07/24 0456 05/08/24 0559 05/08/24 1916 05/09/24 0314  NA 135 139 139 136 135  K 3.8 3.9 4.5 4.1 4.0  CL 102 105 106 103 103  CO2 22 26 28 22 23   GLUCOSE 115* 98 89 235* 161*  BUN 16 17 11 12 11   CREATININE 0.96 0.93 0.94 1.03 0.87  CALCIUM  8.6* 8.3* 8.4* 8.6* 8.7*  MG  --   --  2.1  --  1.7  PHOS  --   --  3.8  --  4.4    Studies: DG Chest Port 1 View Result Date: 05/09/2024 CLINICAL DATA:  Shortness of breath. EXAM: PORTABLE CHEST 1 VIEW COMPARISON:  05/08/2024 FINDINGS: Interval development of patchy left parahilar airspace disease, suspicious for pneumonia lungs are hyperexpanded. Interstitial markings are diffusely coarsened with chronic features. The cardio pericardial silhouette is enlarged. No acute bony abnormality. Telemetry leads overlie the chest. IMPRESSION: 1. Interval development of patchy left parahilar airspace disease, suspicious for pneumonia. 2. Hyperexpansion with chronic interstitial coarsening. Electronically Signed   By: Donnal Fusi M.D.   On: 05/09/2024 05:33   DG Chest Port 1 View Result Date: 05/08/2024 CLINICAL DATA:  Shortness of breath. EXAM: PORTABLE CHEST 1 VIEW COMPARISON:  May 08, 2024 (3:02 p.m.) FINDINGS: Multiple sternal wires are present. The heart size and mediastinal contours are within normal limits. Mild right apical linear scarring and/or atelectasis is seen. Mild left basilar atelectasis and/or infiltrate is also noted. This represents a new finding when compared to the earlier study. No pleural effusion or pneumothorax is seen. The visualized skeletal structures are unremarkable. IMPRESSION: 1. Evidence of prior median sternotomy. 2. Mild left basilar atelectasis and/or infiltrate. 3. Mild  right apical linear scarring and/or atelectasis. Electronically Signed   By: Virgle Grime M.D.   On: 05/08/2024 20:02    Scheduled Meds:  vitamin C  500 mg Oral Daily   Chlorhexidine Gluconate Cloth  6 each Topical Daily   donepezil   10 mg Oral QHS   escitalopram   5 mg Oral Daily   fluticasone   2 spray Each Nare Daily   folic acid   1 mg Oral Daily   iron  polysaccharides  150 mg Oral Daily   isosorbide mononitrate  30 mg Oral Daily   loratadine   10 mg Oral Daily   melatonin  10 mg Oral QHS   metoprolol  tartrate  25 mg Oral BID   naltrexone   50 mg Oral Daily   pantoprazole  (PROTONIX ) IV  40 mg Intravenous Q12H   pravastatin   20 mg Oral QHS   Vitamin D (Ergocalciferol)  50,000 Units Oral Q7 days   Continuous Infusions:  azithromycin  250 mL/hr at 05/09/24 0753   piperacillin-tazobactam (ZOSYN)  IV 3.375 g (05/09/24 1605)    PRN Meds: acetaminophen  **OR** acetaminophen , iohexol , ipratropium-albuterol , morphine  injection, nitroGLYCERIN, ondansetron  **OR** ondansetron  (ZOFRAN ) IV, traZODone   Time spent: 55 minutes  Author: Althia Atlas. MD [facility] 05/09/2024 5:33 PM  To reach On-call, see care teams to locate the attending and reach out to them via [email or web address]. If 7PM-7AM, please contact night-coverage If you still have difficulty reaching the attending provider, please page the [facility] (Director on Call) for [facility] on amion for assistance.

## 2024-05-10 ENCOUNTER — Inpatient Hospital Stay: Admit: 2024-05-10 | Discharge: 2024-05-10 | Disposition: A | Discharge status: Home / Self Care

## 2024-05-10 DIAGNOSIS — K921 Melena: Secondary | ICD-10-CM | POA: Diagnosis not present

## 2024-05-10 LAB — BASIC METABOLIC PANEL WITH GFR
Anion gap: 7 (ref 5–15)
BUN: 14 mg/dL (ref 8–23)
CO2: 31 mmol/L (ref 22–32)
Calcium: 8.8 mg/dL — ABNORMAL LOW (ref 8.9–10.3)
Chloride: 102 mmol/L (ref 98–111)
Creatinine, Ser: 1.1 mg/dL (ref 0.61–1.24)
GFR, Estimated: >60 mL/min (ref >60)
Glucose, Bld: 125 mg/dL — ABNORMAL HIGH (ref 70–99)
Potassium: 4.5 mmol/L (ref 3.5–5.1)
Sodium: 140 mmol/L (ref 135–145)

## 2024-05-10 LAB — ECHOCARDIOGRAM COMPLETE
AR max vel: 0.52 cm2
AV Area VTI: 0.49 cm2
AV Area mean vel: 0.49 cm2
AV Mean grad: 39 mmHg
AV Peak grad: 66.3 mmHg
Ao pk vel: 4.07 m/s
Area-P 1/2: 4.41 cm2
Height: 72 in
MV VTI: 1.53 cm2
P 1/2 time: 498 ms
S' Lateral: 3.5 cm
Weight: 3680 [oz_av]

## 2024-05-10 LAB — CBC
HCT: 22.4 % — ABNORMAL LOW (ref 39.0–52.0)
Hemoglobin: 6.9 g/dL — ABNORMAL LOW (ref 13.0–17.0)
MCH: 28.3 pg (ref 26.0–34.0)
MCHC: 30.8 g/dL (ref 30.0–36.0)
MCV: 91.8 fL (ref 80.0–100.0)
Platelets: 256 10*3/uL (ref 150–400)
RBC: 2.44 MIL/uL — ABNORMAL LOW (ref 4.22–5.81)
RDW: 20.7 % — ABNORMAL HIGH (ref 11.5–15.5)
WBC: 19 10*3/uL — ABNORMAL HIGH (ref 4.0–10.5)
nRBC: 0 % (ref 0.0–0.2)

## 2024-05-10 LAB — RESP PANEL BY RT-PCR (RSV, FLU A&B, COVID)  RVPGX2
Influenza A by PCR: NEGATIVE
Influenza B by PCR: NEGATIVE
Resp Syncytial Virus by PCR: NEGATIVE
SARS Coronavirus 2 by RT PCR: NEGATIVE

## 2024-05-10 LAB — PREPARE RBC (CROSSMATCH)

## 2024-05-10 LAB — HEMOGLOBIN AND HEMATOCRIT, BLOOD
HCT: 24 % — ABNORMAL LOW (ref 39.0–52.0)
Hemoglobin: 7.8 g/dL — ABNORMAL LOW (ref 13.0–17.0)

## 2024-05-10 LAB — PHOSPHORUS: Phosphorus: 4.5 mg/dL (ref 2.5–4.6)

## 2024-05-10 LAB — MAGNESIUM: Magnesium: 2.3 mg/dL (ref 1.7–2.4)

## 2024-05-10 MED ORDER — ORAL CARE MOUTH RINSE: 15.0000 mL | OROMUCOSAL | Status: DC | PRN

## 2024-05-10 MED ORDER — ALUM & MAG HYDROXIDE-SIMETH 200-200-20 MG/5ML PO SUSP
30.0000 mL | ORAL | Status: DC | PRN
  Administered 2024-05-10: 30 mL via ORAL
  Filled 2024-05-10: qty 30

## 2024-05-10 MED ORDER — MIDODRINE HCL 5 MG PO TABS
10.0000 mg | ORAL_TABLET | Freq: Three times a day (TID) | ORAL | Status: DC
  Administered 2024-05-10 – 2024-05-12 (×4): 10 mg via ORAL
  Filled 2024-05-10 (×4): qty 2

## 2024-05-10 MED ORDER — SODIUM CHLORIDE 0.9% IV SOLUTION: Freq: Once | INTRAVENOUS | Status: AC

## 2024-05-10 NOTE — Progress Notes (Signed)
 [facility] Progress Note  Patient: [name]    MWU:[id number]  DOA: 05/06/2024     Date of Service: the patient was seen and examined on 05/10/2024  Chief Complaint  Patient presents with   Dizziness   Blood In Stools   Brief hospital course: Joshva Labreck is a 63 y.o. Caucasian male with PMH of PMH of 3vCAD w/ hx inferior STEMI (2013) s/p PCI with stent RCA + CABG x 3 ([facility]), moderate-severe aortic stenosis (10/2022), HFpEF, HTN, COPD, CVA, who presented to the ED with c/o melena over the last few weeks.  No bright red bleeding, no nausea vomiting, no abdominal pain. C/o chest pain and bilateral shoulder ache which resolved.   ED Course: VS BP 92/77 HR 109 and RR 22  Labs: calcium  8.6 and albumin 3.1 with otherwise unremarkable CMP.  High-sensitivity troponin was 15 twice.   CBC: wbc 12.7 with neutrophilia and anemia with Hb 8.3  Blood glucose A+ with negative antibody screen. EKG as reviewed by me : EKG showed sinus tachycardia with a rate of 108 with biatrial enlargement. Imaging: None.   The patient was 20 mg of IV Pepcid .  He will be admitted to a medical telemetry bed for further evaluation and management.   Assessment and Plan:  # NSTEMI # 3vCAD w/ hx inferior STEMI (2013) s/p PCI with stent RCA + CABG x 3 ([facility]), moderate-severe aortic stenosis (10/2022), HFpEF, HTN Patient had chest pain on arrival and bilateral shoulder ache which resolved, tropes were negative. 5/22 again patient had chest pain and shoulder ache after colonoscopy S/p nitroglycerin x 3 doses given and due to respiratory failure and severe chest pain patient was transferred to stepdown unit Troponin peaked 1176  Cardiology consulted, started Lopressor  25 mg p.o. twice daily and Imdur 30 mg p.o. daily, not a good candidate for anticoagulation heparin  infusion due to high risk for bleeding 5/24 low blood pressure, discontinued Lopressor  and started midodrine 10 mg p.o. 3 times daily.  Follow holding  parameters for Imdur Follow-up 2D echocardiogram   # Aspiration pneumonia Possible aspiration during colonoscopy CXR consistent with pneumonia Continue azithromycin  and switch ceftriaxone  to Zosyn to cover anaerobes Continue supplemental O2 inhalation and gradually wean off  # Chronic obstructive pulmonary disease (COPD) - Continue inhalers.   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  At that time it was recommended to approach the lesions via lower GI tract to find and cauterize. Continue pantoprazole  40 mg IV twice daily GI consulted, s/p colonoscopy, no source of bleeding identified.  GI recommended CTA and IR embolization of GI bleeding reoccurs.  Monitor H&H.   # Acute blood loss anemia due to GI bleeding as above 5/21, Hb 6.7, 1 unit of PRBC transfused 5/24 Hb 6.9, transfused 1 unit PRBC Monitor H&H and transfuse as needed   # Iron  deficiency, transferrin saturation 13% 5/21 Venofer 300 mg IV one-time dose given followed by oral supplement # Folate level 7.0 at lower end, started folic acid  1 mg p.o. daily # Vitamin D deficiency: started vitamin D 50,000 units p.o. weekly, follow with PCP to repeat vitamin D level after 3 to 6 months.   # Depression: Continue Lexapro . # Dyslipidemia: continue statin therapy.   Body mass index is 31.19 kg/m.  Interventions:   Diet: Heart healthy diet DVT Prophylaxis: SCD, pharmacological prophylaxis contraindicated due to GI bleeding   Advance goals of  care discussion: Full code  Family Communication: family was not present at bedside, at the time of interview.  The pt provided permission to discuss medical plan with the family. Opportunity was given to ask question and all questions were answered satisfactorily.   Disposition:  Pt is from home, admitted with GI bleeding, GI consulted, s/p colonoscopy on 5/22, and  developed NSTEMI and aspiration pneumonia after colonoscopy, which precludes a safe discharge. Discharge to home, when stable, most likely in few days  Subjective: No significant events overnight, patient denies any chest pain or pressure, denies any worsening of shortness of breath.  Still having cough and some shortness of breath.  Denies any GI bleeding. Agreed for blood transfusion.   Physical Exam: General: NAD, lying comfortably Appear in no distress, affect appropriate Eyes: PERRLA ENT: Oral Mucosa Clear, moist  Neck: no JVD,  Cardiovascular: S1 and S2 Present, no Murmur,  Respiratory: Equal air entry bilaterally, mild Crackles, no wheezes Abdomen: Bowel Sound present, Soft, obese and no tenderness,  Skin: no rashes Extremities: no Pedal edema, no calf tenderness Neurologic: without any new focal findings Gait not checked due to patient safety concerns  Vitals:   05/10/24 1332 05/10/24 1333 05/10/24 1500 05/10/24 1502  BP: (!) 97/56 (!) 97/56 (!) 76/37 (!) 86/59  Pulse:  88 78 84  Resp:  18 14 (!) 22  Temp: 98.4 F (36.9 C) 98.4 F (36.9 C)    TempSrc: Axillary     SpO2:   100% 100%  Weight:      Height:        Intake/Output Summary (Last 24 hours) at 05/10/2024 1542 Last data filed at 05/10/2024 1400 Gross per 24 hour  Intake 991.32 ml  Output 1150 ml  Net -158.68 ml   Filed Weights   05/06/24 1758  Weight: 104.3 kg    Data Reviewed: I have personally reviewed and interpreted daily labs, tele strips, imagings as discussed above. I reviewed all nursing notes, pharmacy notes, vitals, pertinent old records I have discussed plan of care as described above with RN and patient/family.  CBC: Recent Labs  Lab 05/06/24 1812 05/06/24 2226 05/07/24 0456 05/07/24 1027 05/08/24 0559 05/08/24 1505 05/08/24 1916 05/09/24 0314 05/10/24 0306  WBC 12.7*  --  9.4  --  10.9*  --  17.8* 19.0* 19.0*  NEUTROABS 8.9*  --   --   --   --   --   --   --   --   HGB 8.3*    < > 6.7*   < > 7.8* 8.1* 8.2* 8.0* 6.9*  HCT 27.0*   < > 21.3*   < > 24.5* 25.8* 26.4* 25.2* 22.4*  MCV 87.7  --  86.9  --  88.4  --  91.0 88.4 91.8  PLT 314  --  207  --  247  --  262 249 256   < > = values in this interval not displayed.   Basic Metabolic Panel: Recent Labs  Lab 05/07/24 0456 05/08/24 0559 05/08/24 1916 05/09/24 0314 05/10/24 0306  NA 139 139 136 135 140  K 3.9 4.5 4.1 4.0 4.5  CL 105 106 103 103 102  CO2 26 28 22 23 31   GLUCOSE 98 89 235* 161* 125*  BUN 17 11 12 11 14   CREATININE 0.93 0.94 1.03 0.87 1.10  CALCIUM  8.3* 8.4* 8.6* 8.7* 8.8*  MG  --  2.1  --  1.7 2.3  PHOS  --  3.8  --  4.4 4.5    Studies: ECHOCARDIOGRAM COMPLETE Result Date: 05/10/2024    ECHOCARDIOGRAM REPORT   Patient Name:   Hazen Alicia Date of Exam: 05/10/2024 Medical Rec #:  [id number]    Height:       72.0 in Accession #:    [id number]   Weight:       230.0 lb Date of Birth:  [birth year]     BSA:          2.261 m Patient Age:    63 years     BP:           103/58 mmHg Patient Gender: M            HR:           92 bpm. Exam Location:  [facility] Procedure: 2D Echo, Cardiac Doppler and Color Doppler (Both Spectral and Color            Flow Doppler were utilized during procedure). Indications:     Chest Pain R07.9  History:         Patient has prior history of Echocardiogram examinations.                  Stroke.  Sonographer:     Kathaleen Pale Roar Referring Phys:  [id number] Creighton Doffing Diagnosing Phys: Antonette Batters MD IMPRESSIONS  1. Left ventricular ejection fraction, by estimation, is 60 to 65%. The left ventricle has normal function. The left ventricle has no regional wall motion abnormalities. The left ventricular internal cavity size was mildly dilated. Left ventricular diastolic parameters are consistent with Grade I diastolic dysfunction (impaired relaxation).  2. Right ventricular systolic function is normal. The right ventricular size is normal.  3. The mitral valve is normal in structure. No  evidence of mitral valve regurgitation.  4. The aortic valve is normal in structure. Aortic valve regurgitation is trivial. Severe aortic valve stenosis. FINDINGS  Left Ventricle: Left ventricular ejection fraction, by estimation, is 60 to 65%. The left ventricle has normal function. The left ventricle has no regional wall motion abnormalities. Strain was performed and the global longitudinal strain is indeterminate. The left ventricular internal cavity size was mildly dilated. There is borderline left ventricular hypertrophy. Left ventricular diastolic parameters are consistent with Grade I diastolic dysfunction (impaired relaxation). Right Ventricle: The right ventricular size is normal. No increase in right ventricular wall thickness. Right ventricular systolic function is normal. Left Atrium: Left atrial size was normal in size. Right Atrium: Right atrial size was normal in size. Pericardium: There is no evidence of pericardial effusion. Mitral Valve: The mitral valve is normal in structure. No evidence of mitral valve regurgitation. MV peak gradient, 10.1 mmHg. The mean mitral valve gradient is 5.0 mmHg. Tricuspid Valve: The tricuspid valve is normal in structure. Tricuspid valve regurgitation is trivial. Aortic Valve: The aortic valve is normal in structure. Aortic valve regurgitation is trivial. Aortic regurgitation PHT measures 498 msec. Severe aortic stenosis is present. Aortic valve mean gradient measures 39.0 mmHg. Aortic valve peak gradient measures 66.3 mmHg. Aortic valve area, by VTI measures 0.49 cm. Pulmonic Valve: The pulmonic valve was normal in structure. Pulmonic valve regurgitation is not visualized. Aorta: The ascending aorta was not well visualized. IAS/Shunts: No atrial level shunt detected by color flow Doppler. Additional Comments: 3D was performed not requiring image post processing on an independent workstation and was indeterminate.  LEFT VENTRICLE PLAX 2D LVIDd:         5.30 cm  Diastology LVIDs:         3.50 cm   LV e' medial:    5.87 cm/s LV PW:         1.00 cm   LV E/e' medial:  22.5 LV IVS:        1.30 cm   LV e' lateral:   9.57 cm/s LVOT diam:     1.80 cm   LV E/e' lateral: 13.8 LV SV:         43 LV SV Index:   19 LVOT Area:     2.54 cm  RIGHT VENTRICLE RV Basal diam:  2.70 cm RV Mid diam:    2.40 cm RV S prime:     6.96 cm/s TAPSE (M-mode): 1.5 cm LEFT ATRIUM             Index        RIGHT ATRIUM           Index LA diam:        4.00 cm 1.77 cm/m   RA Area:     15.40 cm LA Vol (A2C):   52.9 ml 23.40 ml/m  RA Volume:   37.60 ml  16.63 ml/m LA Vol (A4C):   60.0 ml 26.54 ml/m LA Biplane Vol: 57.7 ml 25.52 ml/m  AORTIC VALVE                     PULMONIC VALVE AV Area (Vmax):    0.52 cm      PV Vmax:        1.11 m/s AV Area (Vmean):   0.49 cm      PV Peak grad:   4.9 mmHg AV Area (VTI):     0.49 cm      RVOT Peak grad: 3 mmHg AV Vmax:           407.00 cm/s AV Vmean:          295.500 cm/s AV VTI:            0.876 m AV Peak Grad:      66.3 mmHg AV Mean Grad:      39.0 mmHg LVOT Vmax:         83.80 cm/s LVOT Vmean:        57.300 cm/s LVOT VTI:          0.168 m LVOT/AV VTI ratio: 0.19 AI PHT:            498 msec  AORTA Ao Root diam: 2.50 cm Ao Asc diam:  2.60 cm MITRAL VALVE                TRICUSPID VALVE MV Area (PHT): 4.41 cm     TR Peak grad:   19.9 mmHg MV Area VTI:   1.53 cm     TR Vmax:        223.00 cm/s MV Peak grad:  10.1 mmHg MV Mean grad:  5.0 mmHg     SHUNTS MV Vmax:       1.59 m/s     Systemic VTI:  0.17 m MV Vmean:      99.8 cm/s    Systemic Diam: 1.80 cm MV Decel Time: 172 msec MV E velocity: 132.00 cm/s MV A velocity: 145.00 cm/s MV E/A ratio:  0.91 MV A Prime:    11.3 cm/s Dwayne Charlett Conroy MD Electronically signed by Antonette Batters MD Signature Date/Time: 05/10/2024/11:59:32 AM    Final    CT  Angio Chest Pulmonary Embolism (PE) W or WO Contrast Result Date: 05/09/2024 CLINICAL DATA:  Sinus tachycardia short of breath, chest pain EXAM: CT ANGIOGRAPHY CHEST WITH  CONTRAST TECHNIQUE: Multidetector CT imaging of the chest was performed using the standard protocol during bolus administration of intravenous contrast. Multiplanar CT image reconstructions and MIPs were obtained to evaluate the vascular anatomy. RADIATION DOSE REDUCTION: This exam was performed according to the departmental dose-optimization program which includes automated exposure control, adjustment of the mA and/or kV according to patient size and/or use of iterative reconstruction technique. CONTRAST:  75mL OMNIPAQUE  IOHEXOL  350 MG/ML SOLN COMPARISON:  CT chest 03/13/2023, chest x-ray 05/09/2024 FINDINGS: Cardiovascular: Satisfactory opacification of the pulmonary arteries to the segmental level. No evidence of pulmonary embolism. Advanced aortic atherosclerosis. No aneurysm. Post CABG changes. Coronary vascular calcification. Normal cardiac size. No pericardial effusion. Mild mitral calcification Mediastinum/Nodes: Patent trachea. No thyroid mass. Subcentimeter mediastinal lymph nodes. Right low paratracheal node measuring 10 mm. Esophagus within normal limits. Lungs/Pleura: Emphysema. Trace pleural effusions. Heterogeneous peribronchovascular consolidations in the left lower lobe with smaller foci of irregular airspace disease in the right upper lobe and posterior left upper lobe, suspicious for multifocal infection Upper Abdomen: No acute finding. Borderline to slightly enlarged spleen Musculoskeletal: No acute or suspicious osseous abnormality. Sternotomy Review of the MIP images confirms the above findings. IMPRESSION: 1. Negative for acute pulmonary embolus. 2. Emphysema. Heterogeneous consolidations in the left lower lobe with smaller foci of irregular airspace disease in the right upper lobe and posterior left upper lobe, suspicious for multifocal infection. Trace pleural effusions. Imaging follow-up to resolution is recommended. 3. Aortic atherosclerosis. Aortic Atherosclerosis (ICD10-I70.0) and  Emphysema (ICD10-J43.9). Electronically Signed   By: Esmeralda Hedge M.D.   On: 05/09/2024 19:48    Scheduled Meds:  vitamin C   500 mg Oral Daily   Chlorhexidine  Gluconate Cloth  6 each Topical Daily   donepezil   10 mg Oral QHS   escitalopram   5 mg Oral Daily   fluticasone   2 spray Each Nare Daily   folic acid   1 mg Oral Daily   iron  polysaccharides  150 mg Oral Daily   isosorbide  mononitrate  30 mg Oral Daily   loratadine   10 mg Oral Daily   melatonin  10 mg Oral QHS   midodrine   10 mg Oral TID WC   naltrexone   50 mg Oral Daily   pantoprazole  (PROTONIX ) IV  40 mg Intravenous Q12H   pravastatin   20 mg Oral QHS   Vitamin D  (Ergocalciferol )  50,000 Units Oral Q7 days   Continuous Infusions:  azithromycin  Stopped (05/10/24 0657)   piperacillin -tazobactam (ZOSYN )  IV Stopped (05/10/24 1158)    PRN Meds: acetaminophen  **OR** acetaminophen , alum & mag hydroxide-simeth, ipratropium-albuterol , morphine  injection, nitroGLYCERIN , ondansetron  **OR** ondansetron  (ZOFRAN ) IV, mouth rinse, traZODone   Time spent: 55 minutes  Author: Althia Atlas. MD [facility] 05/10/2024 3:42 PM  To reach On-call, see care teams to locate the attending and reach out to them via [email or web address]. If 7PM-7AM, please contact night-coverage If you still have difficulty reaching the attending provider, please page the [facility] (Director on Call) for [facility] on amion for assistance.

## 2024-05-10 NOTE — Plan of Care (Signed)
  Problem: Education: Goal: Knowledge of General Education information will improve Description: Including pain rating scale, medication(s)/side effects and non-pharmacologic comfort measures Outcome: Progressing   Problem: Clinical Measurements: Goal: Will remain free from infection Outcome: Progressing Goal: Respiratory complications will improve Outcome: Progressing   Problem: Coping: Goal: Level of anxiety will decrease Outcome: Progressing   Problem: Elimination: Goal: Will not experience complications related to urinary retention Outcome: Progressing   Problem: Pain Managment: Goal: General experience of comfort will improve and/or be controlled Outcome: Progressing   Problem: Safety: Goal: Ability to remain free from injury will improve Outcome: Progressing   Problem: Skin Integrity: Goal: Risk for impaired skin integrity will decrease Outcome: Progressing

## 2024-05-10 NOTE — Progress Notes (Signed)
*  PRELIMINARY RESULTS* Echocardiogram 2D Echocardiogram has been performed.  [name] [name] 05/10/2024, 11:55 AM

## 2024-05-10 NOTE — Progress Notes (Signed)
 Patient ID: [name], male   DOB: 10-Oct-1961, 63 y.o.   MRN: [id number] [facility]    SUBJECTIVE: Patient states she feels somewhat better still has some vague chest pain earlier this morning but he thought it it happened after eating and is concerned it may be related to indigestion or reflux has not been out of bed or ambulated but shortness of breath is stable   Vitals:   05/10/24 0800 05/10/24 1000 05/10/24 1100 05/10/24 1200  BP:  (!) 87/51 (!) 94/55   Pulse:  97 88   Resp:  16 18   Temp: 98.3 F (36.8 C)   98.4 F (36.9 C)  TempSrc: Oral   Oral  SpO2:  96% 99%   Weight:      Height:         Intake/Output Summary (Last 24 hours) at 05/10/2024 1307 Last data filed at 05/10/2024 1213 Gross per 24 hour  Intake 738.15 ml  Output 1250 ml  Net -511.85 ml      PHYSICAL EXAM  General: Well developed, well nourished, in no acute distress HEENT:  Normocephalic and atramatic Neck:  No JVD.  Lungs: Clear bilaterally to auscultation and percussion. Heart: HRRR . Normal S1 and S2 without gallops or 3/6 sem murmurs.  Abdomen: Bowel sounds are positive, abdomen soft and non-tender  Msk:  Back normal, normal gait. Normal strength and tone for age. Extremities: No clubbing, cyanosis or edema.   Neuro: Alert and oriented X 3. Psych:  Good affect, responds appropriately   LABS: Basic Metabolic Panel: Recent Labs    05/09/24 0314 05/10/24 0306  NA 135 140  K 4.0 4.5  CL 103 102  CO2 23 31  GLUCOSE 161* 125*  BUN 11 14  CREATININE 0.87 1.10  CALCIUM  8.7* 8.8*  MG 1.7 2.3  PHOS 4.4 4.5   Liver Function Tests: Recent Labs    05/08/24 1916  AST 37  ALT 24  ALKPHOS 61  BILITOT 1.6*  PROT 6.9  ALBUMIN 3.1*   No results for input(s): "LIPASE", "AMYLASE" in the last 72 hours. CBC: Recent Labs    05/09/24 0314 05/10/24 0306  WBC 19.0* 19.0*  HGB 8.0* 6.9*  HCT 25.2* 22.4*  MCV 88.4 91.8  PLT 249 256   Cardiac Enzymes: No results for input(s):  "CKTOTAL", "CKMB", "CKMBINDEX", "TROPONINI" in the last 72 hours. BNP: Invalid input(s): "POCBNP" D-Dimer: No results for input(s): "DDIMER" in the last 72 hours. Hemoglobin A1C: No results for input(s): "HGBA1C" in the last 72 hours. Fasting Lipid Panel: No results for input(s): "CHOL", "HDL", "LDLCALC", "TRIG", "CHOLHDL", "LDLDIRECT" in the last 72 hours. Thyroid Function Tests: No results for input(s): "TSH", "T4TOTAL", "T3FREE", "THYROIDAB" in the last 72 hours.  Invalid input(s): "FREET3" Anemia Panel: No results for input(s): "VITAMINB12", "FOLATE", "FERRITIN", "TIBC", "IRON ", "RETICCTPCT" in the last 72 hours.  ECHOCARDIOGRAM COMPLETE Result Date: 05/10/2024    ECHOCARDIOGRAM REPORT   Patient Name:   MADDUX Sannes Date of Exam: 05/10/2024 Medical Rec #:  [id number]    Height:       72.0 in Accession #:    [id number]   Weight:       230.0 lb Date of Birth:  1961/11/06     BSA:          2.261 m Patient Age:    63 years     BP:           103/58 mmHg Patient Gender: M  HR:           92 bpm. Exam Location:  [facility] Procedure: 2D Echo, Cardiac Doppler and Color Doppler (Both Spectral and Color            Flow Doppler were utilized during procedure). Indications:     Chest Pain R07.9  History:         Patient has prior history of Echocardiogram examinations.                  Stroke.  Sonographer:     Kathaleen Pale Roar Referring Phys:  [id number] Creighton Doffing Diagnosing Phys: Antonette Batters MD IMPRESSIONS  1. Left ventricular ejection fraction, by estimation, is 60 to 65%. The left ventricle has normal function. The left ventricle has no regional wall motion abnormalities. The left ventricular internal cavity size was mildly dilated. Left ventricular diastolic parameters are consistent with Grade I diastolic dysfunction (impaired relaxation).  2. Right ventricular systolic function is normal. The right ventricular size is normal.  3. The mitral valve is normal in structure. No evidence of mitral  valve regurgitation.  4. The aortic valve is normal in structure. Aortic valve regurgitation is trivial. Severe aortic valve stenosis. FINDINGS  Left Ventricle: Left ventricular ejection fraction, by estimation, is 60 to 65%. The left ventricle has normal function. The left ventricle has no regional wall motion abnormalities. Strain was performed and the global longitudinal strain is indeterminate. The left ventricular internal cavity size was mildly dilated. There is borderline left ventricular hypertrophy. Left ventricular diastolic parameters are consistent with Grade I diastolic dysfunction (impaired relaxation). Right Ventricle: The right ventricular size is normal. No increase in right ventricular wall thickness. Right ventricular systolic function is normal. Left Atrium: Left atrial size was normal in size. Right Atrium: Right atrial size was normal in size. Pericardium: There is no evidence of pericardial effusion. Mitral Valve: The mitral valve is normal in structure. No evidence of mitral valve regurgitation. MV peak gradient, 10.1 mmHg. The mean mitral valve gradient is 5.0 mmHg. Tricuspid Valve: The tricuspid valve is normal in structure. Tricuspid valve regurgitation is trivial. Aortic Valve: The aortic valve is normal in structure. Aortic valve regurgitation is trivial. Aortic regurgitation PHT measures 498 msec. Severe aortic stenosis is present. Aortic valve mean gradient measures 39.0 mmHg. Aortic valve peak gradient measures 66.3 mmHg. Aortic valve area, by VTI measures 0.49 cm. Pulmonic Valve: The pulmonic valve was normal in structure. Pulmonic valve regurgitation is not visualized. Aorta: The ascending aorta was not well visualized. IAS/Shunts: No atrial level shunt detected by color flow Doppler. Additional Comments: 3D was performed not requiring image post processing on an independent workstation and was indeterminate.  LEFT VENTRICLE PLAX 2D LVIDd:         5.30 cm   Diastology LVIDs:          3.50 cm   LV e' medial:    5.87 cm/s LV PW:         1.00 cm   LV E/e' medial:  22.5 LV IVS:        1.30 cm   LV e' lateral:   9.57 cm/s LVOT diam:     1.80 cm   LV E/e' lateral: 13.8 LV SV:         43 LV SV Index:   19 LVOT Area:     2.54 cm  RIGHT VENTRICLE RV Basal diam:  2.70 cm RV Mid diam:    2.40 cm RV S prime:  6.96 cm/s TAPSE (M-mode): 1.5 cm LEFT ATRIUM             Index        RIGHT ATRIUM           Index LA diam:        4.00 cm 1.77 cm/m   RA Area:     15.40 cm LA Vol (A2C):   52.9 ml 23.40 ml/m  RA Volume:   37.60 ml  16.63 ml/m LA Vol (A4C):   60.0 ml 26.54 ml/m LA Biplane Vol: 57.7 ml 25.52 ml/m  AORTIC VALVE                     PULMONIC VALVE AV Area (Vmax):    0.52 cm      PV Vmax:        1.11 m/s AV Area (Vmean):   0.49 cm      PV Peak grad:   4.9 mmHg AV Area (VTI):     0.49 cm      RVOT Peak grad: 3 mmHg AV Vmax:           407.00 cm/s AV Vmean:          295.500 cm/s AV VTI:            0.876 m AV Peak Grad:      66.3 mmHg AV Mean Grad:      39.0 mmHg LVOT Vmax:         83.80 cm/s LVOT Vmean:        57.300 cm/s LVOT VTI:          0.168 m LVOT/AV VTI ratio: 0.19 AI PHT:            498 msec  AORTA Ao Root diam: 2.50 cm Ao Asc diam:  2.60 cm MITRAL VALVE                TRICUSPID VALVE MV Area (PHT): 4.41 cm     TR Peak grad:   19.9 mmHg MV Area VTI:   1.53 cm     TR Vmax:        223.00 cm/s MV Peak grad:  10.1 mmHg MV Mean grad:  5.0 mmHg     SHUNTS MV Vmax:       1.59 m/s     Systemic VTI:  0.17 m MV Vmean:      99.8 cm/s    Systemic Diam: 1.80 cm MV Decel Time: 172 msec MV E velocity: 132.00 cm/s MV A velocity: 145.00 cm/s MV E/A ratio:  0.91 MV A Prime:    11.3 cm/s Antonette Batters MD Electronically signed by Antonette Batters MD Signature Date/Time: 05/10/2024/11:59:32 AM    Final    CT Angio Chest Pulmonary Embolism (PE) W or WO Contrast Result Date: 05/09/2024 CLINICAL DATA:  Sinus tachycardia short of breath, chest pain EXAM: CT ANGIOGRAPHY CHEST WITH CONTRAST TECHNIQUE:  Multidetector CT imaging of the chest was performed using the standard protocol during bolus administration of intravenous contrast. Multiplanar CT image reconstructions and MIPs were obtained to evaluate the vascular anatomy. RADIATION DOSE REDUCTION: This exam was performed according to the departmental dose-optimization program which includes automated exposure control, adjustment of the mA and/or kV according to patient size and/or use of iterative reconstruction technique. CONTRAST:  75mL OMNIPAQUE  IOHEXOL  350 MG/ML SOLN COMPARISON:  CT chest 03/13/2023, chest x-ray 05/09/2024 FINDINGS: Cardiovascular: Satisfactory opacification of the pulmonary arteries to the segmental level. No  evidence of pulmonary embolism. Advanced aortic atherosclerosis. No aneurysm. Post CABG changes. Coronary vascular calcification. Normal cardiac size. No pericardial effusion. Mild mitral calcification Mediastinum/Nodes: Patent trachea. No thyroid mass. Subcentimeter mediastinal lymph nodes. Right low paratracheal node measuring 10 mm. Esophagus within normal limits. Lungs/Pleura: Emphysema. Trace pleural effusions. Heterogeneous peribronchovascular consolidations in the left lower lobe with smaller foci of irregular airspace disease in the right upper lobe and posterior left upper lobe, suspicious for multifocal infection Upper Abdomen: No acute finding. Borderline to slightly enlarged spleen Musculoskeletal: No acute or suspicious osseous abnormality. Sternotomy Review of the MIP images confirms the above findings. IMPRESSION: 1. Negative for acute pulmonary embolus. 2. Emphysema. Heterogeneous consolidations in the left lower lobe with smaller foci of irregular airspace disease in the right upper lobe and posterior left upper lobe, suspicious for multifocal infection. Trace pleural effusions. Imaging follow-up to resolution is recommended. 3. Aortic atherosclerosis. Aortic Atherosclerosis (ICD10-I70.0) and Emphysema (ICD10-J43.9).  Electronically Signed   By: Esmeralda Hedge M.D.   On: 05/09/2024 19:48   DG Chest Port 1 View Result Date: 05/09/2024 CLINICAL DATA:  Shortness of breath. EXAM: PORTABLE CHEST 1 VIEW COMPARISON:  05/08/2024 FINDINGS: Interval development of patchy left parahilar airspace disease, suspicious for pneumonia lungs are hyperexpanded. Interstitial markings are diffusely coarsened with chronic features. The cardio pericardial silhouette is enlarged. No acute bony abnormality. Telemetry leads overlie the chest. IMPRESSION: 1. Interval development of patchy left parahilar airspace disease, suspicious for pneumonia. 2. Hyperexpansion with chronic interstitial coarsening. Electronically Signed   By: Donnal Fusi M.D.   On: 05/09/2024 05:33   DG Chest Port 1 View Result Date: 05/08/2024 CLINICAL DATA:  Shortness of breath. EXAM: PORTABLE CHEST 1 VIEW COMPARISON:  May 08, 2024 (3:02 p.m.) FINDINGS: Multiple sternal wires are present. The heart size and mediastinal contours are within normal limits. Mild right apical linear scarring and/or atelectasis is seen. Mild left basilar atelectasis and/or infiltrate is also noted. This represents a new finding when compared to the earlier study. No pleural effusion or pneumothorax is seen. The visualized skeletal structures are unremarkable. IMPRESSION: 1. Evidence of prior median sternotomy. 2. Mild left basilar atelectasis and/or infiltrate. 3. Mild right apical linear scarring and/or atelectasis. Electronically Signed   By: Virgle Grime M.D.   On: 05/08/2024 20:02   DG Chest Port 1 View Result Date: 05/08/2024 CLINICAL DATA:  Shortness of breath. EXAM: PORTABLE CHEST 1 VIEW COMPARISON:  06/07/2023 FINDINGS: Prior median sternotomy. Stable heart size and mediastinal contours. Chronic hyperinflation and bronchial thickening. No evidence of acute airspace disease. No pneumothorax or significant pleural effusion. Normal pulmonary vasculature. IMPRESSION: Chronic  hyperinflation and bronchial thickening. No acute findings. Electronically Signed   By: Chadwick Colonel M.D.   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 HFpEF Lower GI bleed with AVMs Chronic angina Moderate to severe aortic stenosis History of CVA Elevated troponin possible non-STEMI versus demand ischemia  Plan Patient somewhat improved with recommend transfer back to floor care Continue aggressive antianginal nitrates Ranexa Recommend low-dose beta-blockade therapy if blood pressure allows We will continue to avoid anticoagulants including aspirin  because of his lower GI bleeding until cleared by GI Not  recommend any invasive cardiac procedures like cardiac cath because of his bleeding risk Recommend correcting anemia up to 9 or 10 hemoglobin because of his cardiovascular symptoms Aggressive statin therapy because of history of significant multivessel coronary disease and arteriosclerotic vascular disease Continue current cardiovascular management medically Recommend physical therapy to help with ambulation and gradual activity to help with strength training and ambulation  Antonette Batters, MD 05/10/2024 1:07 PM

## 2024-05-10 NOTE — Plan of Care (Signed)
 ?  Problem: Clinical Measurements: ?Goal: Diagnostic test results will improve ?Outcome: Progressing ?  ?Problem: Safety: ?Goal: Ability to remain free from injury will improve ?Outcome: Progressing ?  ?

## 2024-05-10 NOTE — Progress Notes (Signed)
 MD messaged by RN regarding soft BP. MD referred to cardiology who agreed to start Midodrine, discontinue beta blockers and decrease Imdur dosage. Midodrine administered to patient by RN.  Patient received 1 unit PRBCs. Hgl 6.7, asymptomatic.  Continuing to monitor vitals closely.

## 2024-05-11 ENCOUNTER — Encounter: Payer: Self-pay | Admitting: Family Medicine

## 2024-05-11 DIAGNOSIS — K921 Melena: Secondary | ICD-10-CM | POA: Diagnosis not present

## 2024-05-11 LAB — PREPARE RBC (CROSSMATCH)

## 2024-05-11 LAB — HEMOGLOBIN AND HEMATOCRIT, BLOOD
HCT: 27.4 % — ABNORMAL LOW (ref 39.0–52.0)
Hemoglobin: 8.8 g/dL — ABNORMAL LOW (ref 13.0–17.0)

## 2024-05-11 LAB — CBC
HCT: 23.1 % — ABNORMAL LOW (ref 39.0–52.0)
Hemoglobin: 7.3 g/dL — ABNORMAL LOW (ref 13.0–17.0)
MCH: 28.6 pg (ref 26.0–34.0)
MCHC: 31.6 g/dL (ref 30.0–36.0)
MCV: 90.6 fL (ref 80.0–100.0)
Platelets: 225 10*3/uL (ref 150–400)
RBC: 2.55 MIL/uL — ABNORMAL LOW (ref 4.22–5.81)
RDW: 20.6 % — ABNORMAL HIGH (ref 11.5–15.5)
WBC: 16 10*3/uL — ABNORMAL HIGH (ref 4.0–10.5)
nRBC: 0 % (ref 0.0–0.2)

## 2024-05-11 LAB — BASIC METABOLIC PANEL WITH GFR
Anion gap: 8 (ref 5–15)
BUN: 14 mg/dL (ref 8–23)
CO2: 28 mmol/L (ref 22–32)
Calcium: 8.3 mg/dL — ABNORMAL LOW (ref 8.9–10.3)
Chloride: 101 mmol/L (ref 98–111)
Creatinine, Ser: 1.23 mg/dL (ref 0.61–1.24)
GFR, Estimated: >60 mL/min (ref >60)
Glucose, Bld: 129 mg/dL — ABNORMAL HIGH (ref 70–99)
Potassium: 3.9 mmol/L (ref 3.5–5.1)
Sodium: 137 mmol/L (ref 135–145)

## 2024-05-11 LAB — MAGNESIUM: Magnesium: 1.8 mg/dL (ref 1.7–2.4)

## 2024-05-11 LAB — PHOSPHORUS: Phosphorus: 4 mg/dL (ref 2.5–4.6)

## 2024-05-11 MED ORDER — SODIUM CHLORIDE 0.9% IV SOLUTION: Freq: Once | INTRAVENOUS | Status: AC

## 2024-05-11 MED ORDER — ENSURE ENLIVE PO LIQD
237.0000 mL | Freq: Two times a day (BID) | ORAL | Status: DC
  Administered 2024-05-11 – 2024-05-14 (×6): 237 mL via ORAL

## 2024-05-11 NOTE — Progress Notes (Signed)
 Dr. Hubert Madden notified patient's hgb 7.3 on morning CBC. Pt reports feeling weak and fatigued. Pt had BM x1. No blood noted in stool. See new orders for one unit PRBCs.

## 2024-05-11 NOTE — Progress Notes (Signed)
 [facility] Progress Note  Patient: [name]    RUE:[id number]  DOA: 05/06/2024     Date of Service: the patient was seen and examined on 05/11/2024  Chief Complaint  Patient presents with   Dizziness   Blood In Stools   Brief hospital course: Nakia Koble is a 63 y.o. Caucasian male with PMH of PMH of 3vCAD w/ hx inferior STEMI (2013) s/p PCI with stent RCA + CABG x 3 ([facility]), moderate-severe aortic stenosis (10/2022), HFpEF, HTN, COPD, CVA, who presented to the ED with c/o melena over the last few weeks.  No bright red bleeding, no nausea vomiting, no abdominal pain. C/o chest pain and bilateral shoulder ache which resolved.   ED Course: VS BP 92/77 HR 109 and RR 22  Labs: calcium  8.6 and albumin 3.1 with otherwise unremarkable CMP.  High-sensitivity troponin was 15 twice.   CBC: wbc 12.7 with neutrophilia and anemia with Hb 8.3  Blood glucose A+ with negative antibody screen. EKG as reviewed by me : EKG showed sinus tachycardia with a rate of 108 with biatrial enlargement. Imaging: None.   The patient was 20 mg of IV Pepcid .  He will be admitted to a medical telemetry bed for further evaluation and management.   Assessment and Plan:  # NSTEMI # 3vCAD w/ hx inferior STEMI (2013) s/p PCI with stent RCA + CABG x 3 ([facility]), moderate-severe aortic stenosis (10/2022), HFpEF, HTN Patient had chest pain on arrival and bilateral shoulder ache which resolved, tropes were negative. 5/22 again patient had chest pain and shoulder ache after colonoscopy S/p nitroglycerin x 3 doses given and due to respiratory failure and severe chest pain patient was transferred to stepdown unit Troponin peaked 1176  Cardiology consulted, started Lopressor  25 mg p.o. twice daily and Imdur 30 mg p.o. daily, not a good candidate for anticoagulation heparin  infusion due to high risk for bleeding 5/24 low blood pressure, discontinued Lopressor  and started midodrine 10 mg p.o. 3 times daily.  Follow holding  parameters for Imdur 5/24TTE: LVEF 60 to 65%, grade 1 diastolic function.  Severe aortic stenosis. D/w cardiology patient is not a good candidate for any intervention for aortic stenosis due to risk of bleeding. Cardiology Recommend correcting anemia up to 9 or 10 hemoglobin because of his cardiovascular symptoms   # Aspiration pneumonia Possible aspiration during colonoscopy CXR consistent with pneumonia Continue azithromycin  and switch ceftriaxone  to Zosyn to cover anaerobes Continue supplemental O2 inhalation and gradually wean off  # Chronic obstructive pulmonary disease (COPD) - Continue inhalers.   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  At that time it was recommended to approach the lesions via lower GI tract to find and cauterize. Continue pantoprazole  40 mg IV twice daily GI consulted, s/p colonoscopy, no source of bleeding identified.  GI recommended CTA and IR embolization of GI bleeding reoccurs.  5/25 Hb 7.3, keep hemoglobin 8-9 as per cardio, transfuse 1 unit PRBC Monitor H&H.   # Acute blood loss anemia due to GI bleeding as above 5/21, Hb 6.7, 1 unit of PRBC transfused 5/24 Hb 6.9, transfused 1 unit PRBC 5/25 Hb 7.3, transfused 1 unit PRBC Monitor H&H and transfuse as needed   # Iron  deficiency, transferrin saturation 13% 5/21 Venofer 300 mg IV one-time dose given followed by oral supplement # Folate level 7.0 at lower end, started folic acid  1 mg p.o. daily # Vitamin  D deficiency: started vitamin D 50,000 units p.o. weekly, follow with PCP to repeat vitamin D level after 3 to 6 months.   # Depression: Continue Lexapro . # Dyslipidemia: continue statin therapy.   Body mass index is 31.19 kg/m.  Interventions:   Diet: Heart healthy diet DVT Prophylaxis: SCD, pharmacological prophylaxis contraindicated due to GI bleeding   Advance goals of  care discussion: Full code  Family Communication: family was not present at bedside, at the time of interview.  The pt provided permission to discuss medical plan with the family. Opportunity was given to ask question and all questions were answered satisfactorily.   Disposition:  Pt is from home, admitted with GI bleeding, GI consulted, s/p colonoscopy on 5/22, and developed NSTEMI and aspiration pneumonia after colonoscopy, which precludes a safe discharge. Discharge to home, when stable, most likely in few days  Subjective: No significant events overnight, patient had a BM, no GI bleeding, breathing is getting better, currently saturating well on 2 L oxygen.  Denied any chest pain or palpitations, no abdominal pain. Mild productive cough.   Physical Exam: General: NAD, lying comfortably Appear in no distress, affect appropriate Eyes: PERRLA ENT: Oral Mucosa Clear, moist  Neck: no JVD,  Cardiovascular: S1 and S2 Present, no Murmur,  Respiratory: Equal air entry bilaterally, mild Crackles, no wheezes Abdomen: Bowel Sound present, Soft, obese and no tenderness,  Skin: no rashes Extremities: no Pedal edema, no calf tenderness Neurologic: without any new focal findings Gait not checked due to patient safety concerns  Vitals:   05/11/24 1200 05/11/24 1300 05/11/24 1400 05/11/24 1405  BP: (!) 104/53 102/61 108/62   Pulse: 81 76 (!) 101 95  Resp: 13 12 20 15   Temp:    98.2 F (36.8 C)  TempSrc:    Oral  SpO2: 99% 99% 93% 93%  Weight:      Height:        Intake/Output Summary (Last 24 hours) at 05/11/2024 1444 Last data filed at 05/11/2024 1416 Gross per 24 hour  Intake 1748.39 ml  Output 1475 ml  Net 273.39 ml   Filed Weights   05/06/24 1758  Weight: 104.3 kg    Data Reviewed: I have personally reviewed and interpreted daily labs, tele strips, imagings as discussed above. I reviewed all nursing notes, pharmacy notes, vitals, pertinent old records I have discussed plan  of care as described above with RN and patient/family.  CBC: Recent Labs  Lab 05/06/24 1812 05/06/24 2226 05/08/24 0559 05/08/24 1505 05/08/24 1916 05/09/24 0314 05/10/24 0306 05/10/24 1724 05/11/24 0249  WBC 12.7*   < > 10.9*  --  17.8* 19.0* 19.0*  --  16.0*  NEUTROABS 8.9*  --   --   --   --   --   --   --   --   HGB 8.3*   < > 7.8*   < > 8.2* 8.0* 6.9* 7.8* 7.3*  HCT 27.0*   < > 24.5*   < > 26.4* 25.2* 22.4* 24.0* 23.1*  MCV 87.7   < > 88.4  --  91.0 88.4 91.8  --  90.6  PLT 314   < > 247  --  262 249 256  --  225   < > = values in this interval not displayed.   Basic Metabolic Panel: Recent Labs  Lab 05/08/24 0559 05/08/24 1916 05/09/24 0314 05/10/24 0306 05/11/24 0249  NA 139 136 135 140 137  K 4.5 4.1 4.0 4.5 3.9  CL 106 103 103 102 101  CO2 28 22 23 31 28   GLUCOSE 89 235* 161* 125* 129*  BUN 11 12 11 14 14   CREATININE 0.94 1.03 0.87 1.10 1.23  CALCIUM  8.4* 8.6* 8.7* 8.8* 8.3*  MG 2.1  --  1.7 2.3 1.8  PHOS 3.8  --  4.4 4.5 4.0    Studies: No results found.   Scheduled Meds:  vitamin C  500 mg Oral Daily   Chlorhexidine Gluconate Cloth  6 each Topical Daily   donepezil   10 mg Oral QHS   escitalopram   5 mg Oral Daily   fluticasone   2 spray Each Nare Daily   folic acid   1 mg Oral Daily   iron  polysaccharides  150 mg Oral Daily   isosorbide mononitrate  30 mg Oral Daily   loratadine   10 mg Oral Daily   melatonin  10 mg Oral QHS   midodrine  10 mg Oral TID WC   naltrexone   50 mg Oral Daily   pantoprazole  (PROTONIX ) IV  40 mg Intravenous Q12H   pravastatin   20 mg Oral QHS   Vitamin D (Ergocalciferol)  50,000 Units Oral Q7 days   Continuous Infusions:  azithromycin  Stopped (05/11/24 0645)   piperacillin-tazobactam (ZOSYN)  IV 12.5 mL/hr at 05/11/24 1416    PRN Meds: acetaminophen  **OR** acetaminophen , alum & mag hydroxide-simeth, ipratropium-albuterol , morphine  injection, nitroGLYCERIN, ondansetron  **OR** ondansetron  (ZOFRAN ) IV, mouth rinse,  traZODone   Time spent: 55 minutes  Author: Althia Atlas. MD [facility] 05/11/2024 2:44 PM  To reach On-call, see care teams to locate the attending and reach out to them via [email or web address]. If 7PM-7AM, please contact night-coverage If you still have difficulty reaching the attending provider, please page the [facility] (Director on Call) for [facility] on amion for assistance.

## 2024-05-11 NOTE — Progress Notes (Signed)
 Patient ID: [name], male   DOB: 1961/10/04, 63 y.o.   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 moderate to severe aortic stenosis which is known previous CVA denies any overnight chest pain resting comfortably in bed   Vitals:   05/11/24 1054 05/11/24 1100 05/11/24 1135 05/11/24 1200  BP: 100/61 104/64  (!) 104/53  Pulse: 74 77 79 81  Resp: 12 15 17 13   Temp: 98.2 F (36.8 C)  98 F (36.7 C)   TempSrc: Oral  Oral   SpO2: 99% 99% 99% 99%  Weight:      Height:         Intake/Output Summary (Last 24 hours) at 05/11/2024 1229 Last data filed at 05/11/2024 1201 Gross per 24 hour  Intake 1292.57 ml  Output 775 ml  Net 517.57 ml      PHYSICAL EXAM  General: Well developed, well nourished, in no acute distress HEENT:  Normocephalic and atramatic Neck:  No JVD.  Lungs: Clear bilaterally to auscultation and percussion. Heart: HRRR . Normal S1 and S2 without gallops or murmurs.  Abdomen: Bowel sounds are positive, abdomen soft and non-tender  Msk:  Back normal, normal gait. Normal strength and tone for age. Extremities: No clubbing, cyanosis or edema.   Neuro: Alert and oriented X 3. Psych:  Good affect, responds appropriately   LABS: Basic Metabolic Panel: Recent Labs    05/10/24 0306 05/11/24 0249  NA 140 137  K 4.5 3.9  CL 102 101  CO2 31 28  GLUCOSE 125* 129*  BUN 14 14  CREATININE 1.10 1.23  CALCIUM  8.8* 8.3*  MG 2.3 1.8  PHOS 4.5 4.0   Liver Function Tests: Recent Labs    05/08/24 1916  AST 37  ALT 24  ALKPHOS 61  BILITOT 1.6*  PROT 6.9  ALBUMIN 3.1*   No results for input(s): "LIPASE", "AMYLASE" in the last 72 hours. CBC: Recent Labs    05/10/24 0306 05/10/24 1724 05/11/24 0249  WBC 19.0*  --  16.0*  HGB 6.9* 7.8* 7.3*  HCT 22.4* 24.0* 23.1*  MCV 91.8  --  90.6  PLT 256  --  225   Cardiac Enzymes: No results for input(s): "CKTOTAL",  "CKMB", "CKMBINDEX", "TROPONINI" in the last 72 hours. BNP: Invalid input(s): "POCBNP" D-Dimer: No results for input(s): "DDIMER" in the last 72 hours. Hemoglobin A1C: No results for input(s): "HGBA1C" in the last 72 hours. Fasting Lipid Panel: No results for input(s): "CHOL", "HDL", "LDLCALC", "TRIG", "CHOLHDL", "LDLDIRECT" in the last 72 hours. Thyroid Function Tests: No results for input(s): "TSH", "T4TOTAL", "T3FREE", "THYROIDAB" in the last 72 hours.  Invalid input(s): "FREET3" Anemia Panel: No results for input(s): "VITAMINB12", "FOLATE", "FERRITIN", "TIBC", "IRON ", "RETICCTPCT" in the last 72 hours.  ECHOCARDIOGRAM COMPLETE Result Date: 05/10/2024    ECHOCARDIOGRAM REPORT   Patient Name:   [name] Date of Exam: 05/10/2024 Medical Rec #:  [id number]    Height:       72.0 in Accession #:    [id number]   Weight:       230.0 lb Date of Birth:  09/17/1961     BSA:          2.261 m Patient Age:    63 years     BP:           103/58 mmHg Patient Gender: M            HR:  92 bpm. Exam Location:  [facility] Procedure: 2D Echo, Cardiac Doppler and Color Doppler (Both Spectral and Color            Flow Doppler were utilized during procedure). Indications:     Chest Pain R07.9  History:         Patient has prior history of Echocardiogram examinations.                  Stroke.  Sonographer:     Kathaleen Pale Roar Referring Phys:  [id number] Creighton Doffing Diagnosing Phys: Antonette Batters MD IMPRESSIONS  1. Left ventricular ejection fraction, by estimation, is 60 to 65%. The left ventricle has normal function. The left ventricle has no regional wall motion abnormalities. The left ventricular internal cavity size was mildly dilated. Left ventricular diastolic parameters are consistent with Grade I diastolic dysfunction (impaired relaxation).  2. Right ventricular systolic function is normal. The right ventricular size is normal.  3. The mitral valve is normal in structure. No evidence of mitral valve  regurgitation.  4. The aortic valve is normal in structure. Aortic valve regurgitation is trivial. Severe aortic valve stenosis. FINDINGS  Left Ventricle: Left ventricular ejection fraction, by estimation, is 60 to 65%. The left ventricle has normal function. The left ventricle has no regional wall motion abnormalities. Strain was performed and the global longitudinal strain is indeterminate. The left ventricular internal cavity size was mildly dilated. There is borderline left ventricular hypertrophy. Left ventricular diastolic parameters are consistent with Grade I diastolic dysfunction (impaired relaxation). Right Ventricle: The right ventricular size is normal. No increase in right ventricular wall thickness. Right ventricular systolic function is normal. Left Atrium: Left atrial size was normal in size. Right Atrium: Right atrial size was normal in size. Pericardium: There is no evidence of pericardial effusion. Mitral Valve: The mitral valve is normal in structure. No evidence of mitral valve regurgitation. MV peak gradient, 10.1 mmHg. The mean mitral valve gradient is 5.0 mmHg. Tricuspid Valve: The tricuspid valve is normal in structure. Tricuspid valve regurgitation is trivial. Aortic Valve: The aortic valve is normal in structure. Aortic valve regurgitation is trivial. Aortic regurgitation PHT measures 498 msec. Severe aortic stenosis is present. Aortic valve mean gradient measures 39.0 mmHg. Aortic valve peak gradient measures 66.3 mmHg. Aortic valve area, by VTI measures 0.49 cm. Pulmonic Valve: The pulmonic valve was normal in structure. Pulmonic valve regurgitation is not visualized. Aorta: The ascending aorta was not well visualized. IAS/Shunts: No atrial level shunt detected by color flow Doppler. Additional Comments: 3D was performed not requiring image post processing on an independent workstation and was indeterminate.  LEFT VENTRICLE PLAX 2D LVIDd:         5.30 cm   Diastology LVIDs:         3.50  cm   LV e' medial:    5.87 cm/s LV PW:         1.00 cm   LV E/e' medial:  22.5 LV IVS:        1.30 cm   LV e' lateral:   9.57 cm/s LVOT diam:     1.80 cm   LV E/e' lateral: 13.8 LV SV:         43 LV SV Index:   19 LVOT Area:     2.54 cm  RIGHT VENTRICLE RV Basal diam:  2.70 cm RV Mid diam:    2.40 cm RV S prime:     6.96 cm/s TAPSE (M-mode): 1.5 cm LEFT ATRIUM  Index        RIGHT ATRIUM           Index LA diam:        4.00 cm 1.77 cm/m   RA Area:     15.40 cm LA Vol (A2C):   52.9 ml 23.40 ml/m  RA Volume:   37.60 ml  16.63 ml/m LA Vol (A4C):   60.0 ml 26.54 ml/m LA Biplane Vol: 57.7 ml 25.52 ml/m  AORTIC VALVE                     PULMONIC VALVE AV Area (Vmax):    0.52 cm      PV Vmax:        1.11 m/s AV Area (Vmean):   0.49 cm      PV Peak grad:   4.9 mmHg AV Area (VTI):     0.49 cm      RVOT Peak grad: 3 mmHg AV Vmax:           407.00 cm/s AV Vmean:          295.500 cm/s AV VTI:            0.876 m AV Peak Grad:      66.3 mmHg AV Mean Grad:      39.0 mmHg LVOT Vmax:         83.80 cm/s LVOT Vmean:        57.300 cm/s LVOT VTI:          0.168 m LVOT/AV VTI ratio: 0.19 AI PHT:            498 msec  AORTA Ao Root diam: 2.50 cm Ao Asc diam:  2.60 cm MITRAL VALVE                TRICUSPID VALVE MV Area (PHT): 4.41 cm     TR Peak grad:   19.9 mmHg MV Area VTI:   1.53 cm     TR Vmax:        223.00 cm/s MV Peak grad:  10.1 mmHg MV Mean grad:  5.0 mmHg     SHUNTS MV Vmax:       1.59 m/s     Systemic VTI:  0.17 m MV Vmean:      99.8 cm/s    Systemic Diam: 1.80 cm MV Decel Time: 172 msec MV E velocity: 132.00 cm/s MV A velocity: 145.00 cm/s MV E/A ratio:  0.91 MV A Prime:    11.3 cm/s Antonette Batters MD Electronically signed by Antonette Batters MD Signature Date/Time: 05/10/2024/11:59:32 AM    Final    CT Angio Chest Pulmonary Embolism (PE) W or WO Contrast Result Date: 05/09/2024 CLINICAL DATA:  Sinus tachycardia short of breath, chest pain EXAM: CT ANGIOGRAPHY CHEST WITH CONTRAST TECHNIQUE:  Multidetector CT imaging of the chest was performed using the standard protocol during bolus administration of intravenous contrast. Multiplanar CT image reconstructions and MIPs were obtained to evaluate the vascular anatomy. RADIATION DOSE REDUCTION: This exam was performed according to the departmental dose-optimization program which includes automated exposure control, adjustment of the mA and/or kV according to patient size and/or use of iterative reconstruction technique. CONTRAST:  75mL OMNIPAQUE  IOHEXOL  350 MG/ML SOLN COMPARISON:  CT chest 03/13/2023, chest x-ray 05/09/2024 FINDINGS: Cardiovascular: Satisfactory opacification of the pulmonary arteries to the segmental level. No evidence of pulmonary embolism. Advanced aortic atherosclerosis. No aneurysm. Post CABG changes. Coronary vascular calcification. Normal cardiac size. No pericardial  effusion. Mild mitral calcification Mediastinum/Nodes: Patent trachea. No thyroid mass. Subcentimeter mediastinal lymph nodes. Right low paratracheal node measuring 10 mm. Esophagus within normal limits. Lungs/Pleura: Emphysema. Trace pleural effusions. Heterogeneous peribronchovascular consolidations in the left lower lobe with smaller foci of irregular airspace disease in the right upper lobe and posterior left upper lobe, suspicious for multifocal infection Upper Abdomen: No acute finding. Borderline to slightly enlarged spleen Musculoskeletal: No acute or suspicious osseous abnormality. Sternotomy Review of the MIP images confirms the above findings. IMPRESSION: 1. Negative for acute pulmonary embolus. 2. Emphysema. Heterogeneous consolidations in the left lower lobe with smaller foci of irregular airspace disease in the right upper lobe and posterior left upper lobe, suspicious for multifocal infection. Trace pleural effusions. Imaging follow-up to resolution is recommended. 3. Aortic atherosclerosis. Aortic Atherosclerosis (ICD10-I70.0) and Emphysema (ICD10-J43.9).  Electronically Signed   By: Esmeralda Hedge M.D.   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

## 2024-05-11 NOTE — Plan of Care (Signed)
  Problem: Education: Goal: Knowledge of General Education information will improve Description: Including pain rating scale, medication(s)/side effects and non-pharmacologic comfort measures Outcome: Progressing   Problem: Health Behavior/Discharge Planning: Goal: Ability to manage health-related needs will improve Outcome: Progressing   Problem: Clinical Measurements: Goal: Ability to maintain clinical measurements within normal limits will improve Outcome: Progressing Goal: Will remain free from infection Outcome: Progressing Goal: Diagnostic test results will improve Outcome: Progressing Goal: Cardiovascular complication will be avoided Outcome: Progressing   Problem: Activity: Goal: Risk for activity intolerance will decrease Outcome: Progressing   Problem: Nutrition: Goal: Adequate nutrition will be maintained Outcome: Progressing   Problem: Coping: Goal: Level of anxiety will decrease Outcome: Progressing   Problem: Elimination: Goal: Will not experience complications related to bowel motility Outcome: Progressing Goal: Will not experience complications related to urinary retention Outcome: Progressing   Problem: Pain Managment: Goal: General experience of comfort will improve and/or be controlled Outcome: Progressing   Problem: Safety: Goal: Ability to remain free from injury will improve Outcome: Progressing   Problem: Skin Integrity: Goal: Risk for impaired skin integrity will decrease Outcome: Progressing   Problem: Activity: Goal: Ability to tolerate increased activity will improve Outcome: Progressing   Problem: Clinical Measurements: Goal: Ability to maintain a body temperature in the normal range will improve Outcome: Progressing   Problem: Respiratory: Goal: Ability to maintain adequate ventilation will improve Outcome: Progressing Goal: Ability to maintain a clear airway will improve Outcome: Progressing

## 2024-05-12 DIAGNOSIS — K921 Melena: Secondary | ICD-10-CM | POA: Diagnosis not present

## 2024-05-12 LAB — TYPE AND SCREEN
ABO/RH(D): A POS
Antibody Screen: NEGATIVE
Unit division: 0
Unit division: 0

## 2024-05-12 LAB — BASIC METABOLIC PANEL WITH GFR
Anion gap: 9 (ref 5–15)
BUN: 11 mg/dL (ref 8–23)
CO2: 26 mmol/L (ref 22–32)
Calcium: 8.4 mg/dL — ABNORMAL LOW (ref 8.9–10.3)
Chloride: 99 mmol/L (ref 98–111)
Creatinine, Ser: 0.91 mg/dL (ref 0.61–1.24)
GFR, Estimated: >60 mL/min (ref >60)
Glucose, Bld: 128 mg/dL — ABNORMAL HIGH (ref 70–99)
Potassium: 3.8 mmol/L (ref 3.5–5.1)
Sodium: 134 mmol/L — ABNORMAL LOW (ref 135–145)

## 2024-05-12 LAB — CBC
HCT: 29.6 % — ABNORMAL LOW (ref 39.0–52.0)
Hemoglobin: 9.5 g/dL — ABNORMAL LOW (ref 13.0–17.0)
MCH: 28.7 pg (ref 26.0–34.0)
MCHC: 32.1 g/dL (ref 30.0–36.0)
MCV: 89.4 fL (ref 80.0–100.0)
Platelets: 285 10*3/uL (ref 150–400)
RBC: 3.31 MIL/uL — ABNORMAL LOW (ref 4.22–5.81)
RDW: 20.5 % — ABNORMAL HIGH (ref 11.5–15.5)
WBC: 16 10*3/uL — ABNORMAL HIGH (ref 4.0–10.5)
nRBC: 0 % (ref 0.0–0.2)

## 2024-05-12 LAB — BPAM RBC
Blood Product Expiration Date: 202506282359
Blood Product Expiration Date: 202506272359
ISSUE DATE / TIME: 202505251031
ISSUE DATE / TIME: 202505241312
Unit Type and Rh: 6200
Unit Type and Rh: 6200

## 2024-05-12 LAB — MAGNESIUM: Magnesium: 1.9 mg/dL (ref 1.7–2.4)

## 2024-05-12 LAB — PHOSPHORUS: Phosphorus: 3.2 mg/dL (ref 2.5–4.6)

## 2024-05-12 MED ORDER — MIDODRINE HCL 5 MG PO TABS
5.0000 mg | ORAL_TABLET | Freq: Three times a day (TID) | ORAL | Status: DC | PRN
  Administered 2024-05-12: 5 mg via ORAL
  Filled 2024-05-12: qty 1

## 2024-05-12 NOTE — Progress Notes (Signed)
 Patient ID: [name], male   DOB: [birth year], 63 y.o.   MRN: [id number] [facility]    SUBJECTIVE: Resting comfortably no significant chest pain today still weak and fatigued no active bleeding but hemoglobin was low no nausea vomiting or diarrhea   Vitals:   05/12/24 0800 05/12/24 0900 05/12/24 1200 05/12/24 1215  BP: 116/64 129/79 (!) 94/37 (!) 97/59  Pulse: 82 91 73 74  Resp: 18  11 13   Temp: 98.3 F (36.8 C)  98.7 F (37.1 C)   TempSrc: Oral  Oral   SpO2: 99% 91% 98% 100%  Weight:      Height:         Intake/Output Summary (Last 24 hours) at 05/12/2024 1348 Last data filed at 05/12/2024 1201 Gross per 24 hour  Intake 1107.82 ml  Output 900 ml  Net 207.82 ml      PHYSICAL EXAM  General: Well developed, well nourished, in no acute distress HEENT:  Normocephalic and atramatic Neck:  No JVD.  Lungs: Clear bilaterally to auscultation and percussion. Heart: HRRR . Normal S1 and S2 without gallops or murmurs.  Abdomen: Bowel sounds are positive, abdomen soft and non-tender  Msk:  Back normal, normal gait. Normal strength and tone for age. Extremities: No clubbing, cyanosis or edema.   Neuro: Alert and oriented X 3. Psych:  Good affect, responds appropriately   LABS: Basic Metabolic Panel: Recent Labs    05/11/24 0249 05/12/24 0214  NA 137 134*  K 3.9 3.8  CL 101 99  CO2 28 26  GLUCOSE 129* 128*  BUN 14 11  CREATININE 1.23 0.91  CALCIUM  8.3* 8.4*  MG 1.8 1.9  PHOS 4.0 3.2   Liver Function Tests: No results for input(s): "AST", "ALT", "ALKPHOS", "BILITOT", "PROT", "ALBUMIN" in the last 72 hours. No results for input(s): "LIPASE", "AMYLASE" in the last 72 hours. CBC: Recent Labs    05/11/24 0249 05/11/24 1536 05/12/24 0214  WBC 16.0*  --  16.0*  HGB 7.3* 8.8* 9.5*  HCT 23.1* 27.4* 29.6*  MCV 90.6  --  89.4  PLT 225  --  285   Cardiac Enzymes: No results for input(s): "CKTOTAL", "CKMB", "CKMBINDEX", "TROPONINI" in the last 72  hours. BNP: Invalid input(s): "POCBNP" D-Dimer: No results for input(s): "DDIMER" in the last 72 hours. Hemoglobin A1C: No results for input(s): "HGBA1C" in the last 72 hours. Fasting Lipid Panel: No results for input(s): "CHOL", "HDL", "LDLCALC", "TRIG", "CHOLHDL", "LDLDIRECT" in the last 72 hours. Thyroid Function Tests: No results for input(s): "TSH", "T4TOTAL", "T3FREE", "THYROIDAB" in the last 72 hours.  Invalid input(s): "FREET3" Anemia Panel: No results for input(s): "VITAMINB12", "FOLATE", "FERRITIN", "TIBC", "IRON ", "RETICCTPCT" in the last 72 hours.  No results found.   Echo normal left ventricular function EF of 60 to 65%  TELEMETRY: Sinus rhythm rate of 70:  ASSESSMENT AND PLAN:  Principal Problem:   GI bleeding Active Problems:   Dyslipidemia   Depression   Chronic obstructive pulmonary disease (COPD) (HCC) Hypertension Multivessel coronary artery disease GERD Chronic angina  Non-STEMI Possible aspiration pneumonia  Plan Continue supportive care for hypotension Nitrates beta-blocker for anginal symptoms hold all anticoagulants because of bleeding Midodrine 10 mg 3 times a day for hypotension Possible aspiration pneumonia continue broad-spectrum antibiotic therapy GI bleeding status post GI input with multiple sclerosis possible AVMs continue to hold anticoagulants for now Agree with Protonix  therapy for reflux type symptoms Continue to transfuse hopefully can get stable hemoglobin of 9 with his cardiac disease  Agree with statin therapy for hyperlipidemia   Antonette Batters, MD 05/12/2024 1:48 PM

## 2024-05-12 NOTE — Plan of Care (Signed)
  Problem: Education: Goal: Knowledge of General Education information will improve Description: Including pain rating scale, medication(s)/side effects and non-pharmacologic comfort measures 05/12/2024 1734 by Cyndia Drape, RN Outcome: Progressing 05/12/2024 1733 by Cyndia Drape, RN Outcome: Progressing   Problem: Health Behavior/Discharge Planning: Goal: Ability to manage health-related needs will improve 05/12/2024 1734 by Cyndia Drape, RN Outcome: Progressing 05/12/2024 1733 by Cyndia Drape, RN Outcome: Progressing   Problem: Clinical Measurements: Goal: Ability to maintain clinical measurements within normal limits will improve 05/12/2024 1734 by Cyndia Drape, RN Outcome: Progressing 05/12/2024 1733 by Cyndia Drape, RN Outcome: Progressing Goal: Will remain free from infection 05/12/2024 1734 by Cyndia Drape, RN Outcome: Progressing 05/12/2024 1733 by Cyndia Drape, RN Outcome: Progressing Goal: Diagnostic test results will improve 05/12/2024 1734 by Cyndia Drape, RN Outcome: Progressing 05/12/2024 1733 by Cyndia Drape, RN Outcome: Progressing Goal: Respiratory complications will improve 05/12/2024 1734 by Cyndia Drape, RN Outcome: Progressing 05/12/2024 1733 by Cyndia Drape, RN Outcome: Progressing Goal: Cardiovascular complication will be avoided 05/12/2024 1734 by Cyndia Drape, RN Outcome: Progressing 05/12/2024 1733 by Cyndia Drape, RN Outcome: Progressing   Problem: Activity: Goal: Risk for activity intolerance will decrease 05/12/2024 1734 by Cyndia Drape, RN Outcome: Progressing 05/12/2024 1733 by Cyndia Drape, RN Outcome: Progressing   Problem: Nutrition: Goal: Adequate nutrition will be maintained 05/12/2024 1734 by Cyndia Drape, RN Outcome: Progressing 05/12/2024 1733 by Cyndia Drape, RN Outcome: Progressing   Problem: Coping: Goal: Level of anxiety will decrease 05/12/2024 1734 by Cyndia Drape, RN Outcome:  Progressing 05/12/2024 1733 by Cyndia Drape, RN Outcome: Progressing   Problem: Elimination: Goal: Will not experience complications related to bowel motility 05/12/2024 1734 by Cyndia Drape, RN Outcome: Progressing 05/12/2024 1733 by Cyndia Drape, RN Outcome: Progressing Goal: Will not experience complications related to urinary retention 05/12/2024 1734 by Cyndia Drape, RN Outcome: Progressing 05/12/2024 1733 by Cyndia Drape, RN Outcome: Progressing   Problem: Pain Managment: Goal: General experience of comfort will improve and/or be controlled 05/12/2024 1734 by Cyndia Drape, RN Outcome: Progressing 05/12/2024 1733 by Cyndia Drape, RN Outcome: Progressing   Problem: Safety: Goal: Ability to remain free from injury will improve 05/12/2024 1734 by Cyndia Drape, RN Outcome: Progressing 05/12/2024 1733 by Cyndia Drape, RN Outcome: Progressing   Problem: Skin Integrity: Goal: Risk for impaired skin integrity will decrease 05/12/2024 1734 by Cyndia Drape, RN Outcome: Progressing 05/12/2024 1733 by Cyndia Drape, RN Outcome: Progressing   Problem: Activity: Goal: Ability to tolerate increased activity will improve 05/12/2024 1734 by Cyndia Drape, RN Outcome: Progressing 05/12/2024 1733 by Cyndia Drape, RN Outcome: Progressing   Problem: Clinical Measurements: Goal: Ability to maintain a body temperature in the normal range will improve 05/12/2024 1734 by Cyndia Drape, RN Outcome: Progressing 05/12/2024 1733 by Cyndia Drape, RN Outcome: Progressing   Problem: Respiratory: Goal: Ability to maintain adequate ventilation will improve 05/12/2024 1734 by Cyndia Drape, RN Outcome: Progressing 05/12/2024 1733 by Cyndia Drape, RN Outcome: Progressing Goal: Ability to maintain a clear airway will improve 05/12/2024 1734 by Cyndia Drape, RN Outcome: Progressing 05/12/2024 1733 by Cyndia Drape, RN Outcome: Progressing

## 2024-05-12 NOTE — Plan of Care (Signed)

## 2024-05-12 NOTE — Progress Notes (Signed)
 [facility] Progress Note  Patient: [name]    WUJ:[id number]  DOA: 05/06/2024     Date of Service: the patient was seen and examined on 05/12/2024  Chief Complaint  Patient presents with   Dizziness   Blood In Stools   Brief hospital course: [name] is a 63 y.o. Caucasian male with PMH of PMH of 3vCAD w/ hx inferior STEMI (2013) s/p PCI with stent RCA + CABG x 3 ([facility]), moderate-severe aortic stenosis (10/2022), HFpEF, HTN, COPD, CVA, who presented to the ED with c/o melena over the last few weeks.  No bright red bleeding, no nausea vomiting, no abdominal pain. C/o chest pain and bilateral shoulder ache which resolved.   ED Course: VS BP 92/77 HR 109 and RR 22  Labs: calcium  8.6 and albumin 3.1 with otherwise unremarkable CMP.  High-sensitivity troponin was 15 twice.   CBC: wbc 12.7 with neutrophilia and anemia with Hb 8.3  Blood glucose A+ with negative antibody screen. EKG as reviewed by me : EKG showed sinus tachycardia with a rate of 108 with biatrial enlargement. Imaging: None.   The patient was 20 mg of IV Pepcid .  He will be admitted to a medical telemetry bed for further evaluation and management.   Assessment and Plan:  # NSTEMI # 3vCAD w/ hx inferior STEMI (2013) s/p PCI with stent RCA + CABG x 3 ([facility]), moderate-severe aortic stenosis (10/2022), HFpEF, HTN Patient had chest pain on arrival and bilateral shoulder ache which resolved, tropes were negative. 5/22 again patient had chest pain and shoulder ache after colonoscopy S/p nitroglycerin x 3 doses given and due to respiratory failure and severe chest pain patient was transferred to stepdown unit Troponin peaked 1176  Cardiology consulted, started Lopressor  25 mg p.o. twice daily and Imdur 30 mg p.o. daily, not a good candidate for anticoagulation heparin  infusion due to high risk for bleeding 5/24 low blood pressure, discontinued Lopressor  and started midodrine 10 mg p.o. 3 times daily.  Follow holding  parameters for Imdur 5/24TTE: LVEF 60 to 65%, grade 1 diastolic function.  Severe aortic stenosis. D/w cardiology patient is not a good candidate for any intervention for aortic stenosis due to risk of bleeding. Cardiology Recommend correcting anemia up to 9 or 10 hemoglobin because of his cardiovascular symptoms   # Hypotension S/p midodrine 10 mg p.o. 3 times daily, held beta-blocker for now BP improved, midodrine changed to as needed Monitor BP and titrate medication accordingly   # Aspiration pneumonia Possible aspiration during colonoscopy CXR consistent with pneumonia Continue azithromycin  and switch ceftriaxone  to Zosyn to cover anaerobes Continue supplemental O2 inhalation and gradually wean off Leukocytosis, trend WBC count  # Chronic obstructive pulmonary disease (COPD) - Continue inhalers.   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  At that time it was recommended to approach the lesions via lower GI tract to find and cauterize. Continue pantoprazole  40 mg IV twice daily GI consulted, s/p colonoscopy, no source of bleeding identified.  GI recommended CTA and IR embolization of GI bleeding reoccurs.  5/25 Hb 7.3, keep hemoglobin 8-9 as per cardio, transfuse 1 unit PRBC 5/26 Hb 9.5 posttransfusion, stable Monitor H&H.   # Acute blood loss anemia due to GI bleeding as above 5/21, Hb 6.7, 1 unit of PRBC transfused 5/24 Hb 6.9, transfused 1 unit PRBC 5/25 Hb 7.3, transfused 1 unit PRBC 5/26 Hb 9.5 posttransfusion,  stable Monitor H&H and transfuse as needed   # Iron  deficiency, transferrin saturation 13% 5/21 Venofer 300 mg IV one-time dose given followed by oral supplement # Folate level 7.0 at lower end, started folic acid  1 mg p.o. daily # Vitamin D deficiency: started vitamin D 50,000 units p.o. weekly, follow with PCP to repeat vitamin D level after 3  to 6 months.   # Depression: Continue Lexapro . # Dyslipidemia: continue statin therapy.   Body mass index is 31.19 kg/m.  Interventions:   Diet: Heart healthy diet DVT Prophylaxis: SCD, pharmacological prophylaxis contraindicated due to GI bleeding   Advance goals of care discussion: Full code  Family Communication: family was not present at bedside, at the time of interview.  The pt provided permission to discuss medical plan with the family. Opportunity was given to ask question and all questions were answered satisfactorily.   Disposition:  Pt is from home, admitted with GI bleeding, GI consulted, s/p colonoscopy on 5/22, and developed NSTEMI and aspiration pneumonia after colonoscopy, which precludes a safe discharge. Discharge to home, when stable, most likely in few days  Subjective: No significant events overnight, patient was resting comfortably, denied any chest pain or pressure, no shortness of breath.  No GI bleeding.  Patient stated that he is feeling fine.  No active issues    Physical Exam: General: NAD, lying comfortably Appear in no distress, affect appropriate Eyes: PERRLA ENT: Oral Mucosa Clear, moist  Neck: no JVD,  Cardiovascular: S1 and S2 Present, audible murmur,  Respiratory: Equal air entry bilaterally, mild Crackles, no wheezes Abdomen: Bowel Sound present, Soft, obese and no tenderness,  Skin: no rashes Extremities: no Pedal edema, no calf tenderness Neurologic: without any new focal findings Gait not checked due to patient safety concerns  Vitals:   05/12/24 1215 05/12/24 1300 05/12/24 1400 05/12/24 1500  BP: (!) 97/59  138/80   Pulse: 74 81 91 65  Resp: 13 16 17 12   Temp:      TempSrc:      SpO2: 100% 97% 96% 99%  Weight:      Height:        Intake/Output Summary (Last 24 hours) at 05/12/2024 1554 Last data filed at 05/12/2024 1520 Gross per 24 hour  Intake 790.1 ml  Output 650 ml  Net 140.1 ml   Filed Weights   05/06/24 1758   Weight: 104.3 kg    Data Reviewed: I have personally reviewed and interpreted daily labs, tele strips, imagings as discussed above. I reviewed all nursing notes, pharmacy notes, vitals, pertinent old records I have discussed plan of care as described above with RN and patient/family.  CBC: Recent Labs  Lab 05/06/24 1812 05/06/24 2226 05/08/24 1916 05/09/24 0314 05/10/24 0306 05/10/24 1724 05/11/24 0249 05/11/24 1536 05/12/24 0214  WBC 12.7*   < > 17.8* 19.0* 19.0*  --  16.0*  --  16.0*  NEUTROABS 8.9*  --   --   --   --   --   --   --   --   HGB 8.3*   < > 8.2* 8.0* 6.9* 7.8* 7.3* 8.8* 9.5*  HCT 27.0*   < > 26.4* 25.2* 22.4* 24.0* 23.1* 27.4* 29.6*  MCV 87.7   < > 91.0 88.4 91.8  --  90.6  --  89.4  PLT 314   < > 262 249 256  --  225  --  285   < > = values in this interval not displayed.  Basic Metabolic Panel: Recent Labs  Lab 05/08/24 0559 05/08/24 1916 05/09/24 0314 05/10/24 0306 05/11/24 0249 05/12/24 0214  NA 139 136 135 140 137 134*  K 4.5 4.1 4.0 4.5 3.9 3.8  CL 106 103 103 102 101 99  CO2 28 22 23 31 28 26   GLUCOSE 89 235* 161* 125* 129* 128*  BUN 11 12 11 14 14 11   CREATININE 0.94 1.03 0.87 1.10 1.23 0.91  CALCIUM  8.4* 8.6* 8.7* 8.8* 8.3* 8.4*  MG 2.1  --  1.7 2.3 1.8 1.9  PHOS 3.8  --  4.4 4.5 4.0 3.2    Studies: No results found.   Scheduled Meds:  vitamin C  500 mg Oral Daily   Chlorhexidine Gluconate Cloth  6 each Topical Daily   donepezil   10 mg Oral QHS   escitalopram   5 mg Oral Daily   feeding supplement  237 mL Oral BID BM   fluticasone   2 spray Each Nare Daily   folic acid   1 mg Oral Daily   iron  polysaccharides  150 mg Oral Daily   isosorbide mononitrate  30 mg Oral Daily   loratadine   10 mg Oral Daily   melatonin  10 mg Oral QHS   naltrexone   50 mg Oral Daily   pantoprazole  (PROTONIX ) IV  40 mg Intravenous Q12H   pravastatin   20 mg Oral QHS   Vitamin D (Ergocalciferol)  50,000 Units Oral Q7 days   Continuous Infusions:   azithromycin  Stopped (05/12/24 0833)   piperacillin-tazobactam (ZOSYN)  IV 12.5 mL/hr at 05/12/24 1520    PRN Meds: acetaminophen  **OR** acetaminophen , alum & mag hydroxide-simeth, ipratropium-albuterol , midodrine, morphine  injection, nitroGLYCERIN, ondansetron  **OR** ondansetron  (ZOFRAN ) IV, mouth rinse, traZODone   Time spent: 40 minutes  Author: Althia Atlas. MD [facility] 05/12/2024 3:54 PM  To reach On-call, see care teams to locate the attending and reach out to them via [email or web address]. If 7PM-7AM, please contact night-coverage If you still have difficulty reaching the attending provider, please page the [facility] (Director on Call) for [facility] on amion for assistance.

## 2024-05-13 DIAGNOSIS — K921 Melena: Secondary | ICD-10-CM | POA: Diagnosis not present

## 2024-05-13 LAB — BASIC METABOLIC PANEL WITH GFR
Anion gap: 10 (ref 5–15)
BUN: 11 mg/dL (ref 8–23)
CO2: 28 mmol/L (ref 22–32)
Calcium: 8.6 mg/dL — ABNORMAL LOW (ref 8.9–10.3)
Chloride: 100 mmol/L (ref 98–111)
Creatinine, Ser: 0.88 mg/dL (ref 0.61–1.24)
GFR, Estimated: >60 mL/min (ref >60)
Glucose, Bld: 179 mg/dL — ABNORMAL HIGH (ref 70–99)
Potassium: 3.8 mmol/L (ref 3.5–5.1)
Sodium: 138 mmol/L (ref 135–145)

## 2024-05-13 LAB — CBC
HCT: 29.4 % — ABNORMAL LOW (ref 39.0–52.0)
Hemoglobin: 9.4 g/dL — ABNORMAL LOW (ref 13.0–17.0)
MCH: 29 pg (ref 26.0–34.0)
MCHC: 32 g/dL (ref 30.0–36.0)
MCV: 90.7 fL (ref 80.0–100.0)
Platelets: 273 10*3/uL (ref 150–400)
RBC: 3.24 MIL/uL — ABNORMAL LOW (ref 4.22–5.81)
RDW: 20.3 % — ABNORMAL HIGH (ref 11.5–15.5)
WBC: 13 10*3/uL — ABNORMAL HIGH (ref 4.0–10.5)
nRBC: 0 % (ref 0.0–0.2)

## 2024-05-13 LAB — PHOSPHORUS: Phosphorus: 3.3 mg/dL (ref 2.5–4.6)

## 2024-05-13 LAB — GLUCOSE, CAPILLARY: Glucose-Capillary: 86 mg/dL (ref 70–99)

## 2024-05-13 LAB — MAGNESIUM: Magnesium: 2.1 mg/dL (ref 1.7–2.4)

## 2024-05-13 MED ORDER — ISOSORBIDE MONONITRATE ER 30 MG PO TB24: 30.0000 mg | ORAL_TABLET | Freq: Every day | ORAL | Status: DC

## 2024-05-13 MED ORDER — PANTOPRAZOLE SODIUM 40 MG PO TBEC
40.0000 mg | DELAYED_RELEASE_TABLET | Freq: Two times a day (BID) | ORAL | Status: DC
  Administered 2024-05-13 – 2024-05-14 (×2): 40 mg via ORAL
  Filled 2024-05-13 (×2): qty 1

## 2024-05-13 MED ORDER — ISOSORBIDE MONONITRATE ER 30 MG PO TB24: 60.0000 mg | ORAL_TABLET | Freq: Every day | ORAL | Status: DC

## 2024-05-13 MED ORDER — ISOSORBIDE MONONITRATE ER 60 MG PO TB24
60.0000 mg | ORAL_TABLET | Freq: Every day | ORAL | Status: DC
  Administered 2024-05-14: 60 mg via ORAL
  Filled 2024-05-13: qty 1

## 2024-05-13 NOTE — TOC Initial Note (Addendum)
 Transition of Care [facility]) - Initial/Assessment Note    Patient Details  Name: [name] MRN: [id number] Date of Birth: Dec 05, 1961  Transition of Care [facility]) CM/SW Contact:    Douglas Rollyson A Jeani Fassnacht, RN Phone Number: 05/13/2024, 2:46 PM  Clinical Narrative:                 Chart reviewed.  Noted that patient was admitted with GI bleeding and NSTEMI.   I have meet with patient at bedside.  Patient reports that prior to admission he lived at home by himself.  [name] reports that he was able to bath and dress himself prior to admission.  He reports that he was able to drive prior to admission.  [name] reports that he wears home 02 continuous. He reports that he has an [employer] oxygen tank.  Patient reports that he also has a 2 wheeled rolling walker.    I have informed patient that PT has recommended home health PT/OT on discharge.  [name] did not have a home health preference.  I have asked [name] with [facility] to accept home health referral.   [facility] will provide home health PT/OT.  I have informed [name] that PT/OT has recommended that patient will need a 3 in 1 on discharge.  [name] was agreeable to the 3 in 1 [facility].   I have asked [facility] to provide patient with a 3 in 1 [facility].   Patient reports that he uses [facility] for Medical services and for prescription medications. [name] reports that medications are affordable.    TOC will continue to follow for discharge planning.    Expected Discharge Plan: Home w Home Health Services Barriers to Discharge: No Barriers Identified   Patient Goals and CMS Choice   CMS Medicare.gov Compare Post Acute Care list provided to:: Patient Choice offered to / list presented to : Patient      Expected Discharge Plan and Services   Discharge Planning Services: CM Consult Post Acute Care Choice: Home Health Living arrangements for the past 2 months: Single Family Home                           HH Arranged: PT, OT HH Agency:  [facility] Date [facility] Agency Contacted: 05/13/24   Representative spoke with at [facility] Agency: [name]  Prior Living Arrangements/Services Living arrangements for the past 2 months: Single Family Home Lives with:: Self Patient language and need for interpreter reviewed:: Yes Do you feel safe going back to the place where you live?: Yes          Current home services:  (Patient has a rolling walker and home 02)    Activities of Daily Living   ADL Screening (condition at time of admission) Independently performs ADLs?: No Does the patient have a NEW difficulty with bathing/dressing/toileting/self-feeding that is expected to last >3 days?: No Does the patient have a NEW difficulty with getting in/out of bed, walking, or climbing stairs that is expected to last >3 days?: Yes (Initiates electronic notice to provider for possible PT consult) Does the patient have a NEW difficulty with communication that is expected to last >3 days?: No Is the patient deaf or have difficulty hearing?: No Does the patient have difficulty seeing, even when wearing glasses/contacts?: No Does the patient have difficulty concentrating, remembering, or making decisions?: No  Permission Sought/Granted  Emotional Assessment Appearance:: Appears stated age Attitude/Demeanor/Rapport: Engaged Affect (typically observed): Appropriate Orientation: : Oriented to Self, Oriented to Place, Oriented to  Time, Oriented to Situation      Admission diagnosis:  GI bleeding [K92.2] Upper GI bleeding [K92.2] Patient Active Problem List   Diagnosis Date Noted   Depression 05/07/2024   Chronic obstructive pulmonary disease (COPD) (HCC) 05/07/2024   AVM (arteriovenous malformation) of small bowel, 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   Rectal bleeding 06/01/2023   GI bleeding 05/31/2023   CAD (coronary artery disease) 05/31/2023   Stroke (HCC) 05/31/2023   Iron  deficiency anemia 05/31/2023   Hyponatremia  05/13/2023   Alcohol  use disorder 05/13/2023   Chest pain 05/13/2023   Elevated troponin 05/13/2023   Chronic diastolic CHF (congestive heart failure) (HCC) 05/13/2023   ABLA (acute blood loss anemia) 05/13/2023   Iron  deficiency anemia due to chronic blood loss 10/30/2022   Obesity hypoventilation syndrome (HCC) 10/29/2022   COPD with acute exacerbation (HCC) 10/28/2022   Melena 10/26/2022   Symptomatic anemia 10/25/2022   Acute diastolic CHF (congestive heart failure) (HCC) 10/25/2022   Essential hypertension 10/25/2022   GERD without esophagitis 10/25/2022   Dyslipidemia 10/25/2022   Special screening for malignant neoplasms, colon    Polyp of sigmoid colon    Orthostatic hypotension 06/04/2021   Stage 3a chronic kidney disease (HCC) 10/05/2020   Alcohol  abuse    Syncope and collapse 05/31/2020   AKI (acute kidney injury) (HCC) 05/31/2020   Hypokalemia 05/31/2020   Memory loss or impairment 12/24/2018   Memory loss of unknown cause 10/14/2018   Sleeping difficulty 10/14/2018   Mild aortic valve stenosis 10/08/2018   Hyperlipidemia 03/27/2018   Cognitive impairment 03/12/2018   History of stroke s/p left carotid endarterectomy 03/12/2018   Hypertension 03/01/2018   Atherosclerosis of native arteries of extremity with intermittent claudication (HCC) 03/01/2018   Atherosclerotic peripheral vascular disease with intermittent claudication (HCC) 01/03/2018   Benign essential HTN 12/07/2017   Bilateral carotid artery stenosis 12/07/2017   SOBOE (shortness of breath on exertion) 12/07/2017   Acute cholecystitis 07/24/2016   Coronary artery disease involving coronary bypass graft of native heart without angina pectoris    Tobacco abuse    PCP:  Evette Hoes, NP Pharmacy:   [facility] - [place], [place] - [address] [place] [address] Phone: [phone] Fax: [phone]  [facility] [address], [place] - [address] [place] [address] Phone: [phone] Fax: [phone]     Social Drivers of Health (SDOH) Social History: SDOH Screenings   Food Insecurity: No Food Insecurity (05/07/2024)  Housing: Low Risk  (05/07/2024)  Transportation Needs: No Transportation Needs (05/07/2024)  Utilities: Not At Risk (05/07/2024)  Social Connections: Socially Isolated (05/07/2024)  Tobacco Use: High Risk (10/18/2023)   Received from [facility]   SDOH Interventions: Social Connections Interventions: Inpatient TOC, Other (Comment) (Resources added to AVS)   Readmission Risk Interventions    10/27/2022   11:28 AM  Readmission Risk Prevention Plan  Transportation Screening Complete  PCP or Specialist Appt within 5-7 Days Complete  Home Care Screening Complete  Medication Review (RN CM) Complete

## 2024-05-13 NOTE — Plan of Care (Signed)

## 2024-05-13 NOTE — Progress Notes (Signed)
 [facility] CARDIOLOGY PROGRESS NOTE       Patient ID: [name] MRN: [id number] DOB/AGE: 03-07-1961 63 y.o.  Admit date: 05/06/2024 Referring Physician Dr. Althia Atlas  Primary Physician Evette Hoes, NP Primary Cardiologist Dr. Parks Bollman (last seen 2024) Reason for Consultation chest pain  HPI: [name] is a 63 y.o. male  with a past medical history of CAD s/p CABG x 3 (04/2012), chronic HFpEF, moderate to severe aortic stenosis, hypertension, hyperlipidemia history of CVA, alcohol  use who presented to the ED on 05/06/2024 for epigastric pain and dark stools.  Patient underwent colonoscopy due to found melena with AVMs on capsule endoscopy.  After colonoscopy patient chest pain that radiates to bilateral shoulders.  Troponin found to be elevated.  Cardiology was consulted for further evaluation.   Patient states he has had constant chest pain for the past month also had a syncopal event at home and never seen a provider regarding the symptoms.  Patient unable to describe his chest pain.  States his chest pain radiates to his throat and shoulders.  Patient endorses dyspnea with mild exertion.  Patient's chest pain-free on 05/23 s/p multiple doses of morphine  and nitroglycerin.   Interval History: -Patient seen and examined this afternoon and laying comfortably in hospital bed. Patient states he feels better this AM and denies any recurrence of chest pain. Patient states SOB feels the same today -Patients BP and HR  stable this AM.  -Patient remains on 3L [place] with stable SpO2.  -Hgb 9.4 this AM.    Review of systems complete and found to be negative unless listed above    Past Medical History:  Diagnosis Date   COPD (chronic obstructive pulmonary disease) (HCC)    CVA (cerebral infarction)    Headache    mild, since stroke 2012   Hypertension    MI (myocardial infarction) (HCC) 05/09/2012   Reading difficulty    pt reports he reads at "about a second grade level"    Stroke [facility]) 2012   numbness to left hand    Wears dentures    full upper and lower    Past Surgical History:  Procedure Laterality Date   CAROTID ENDARTERECTOMY Left    2012 or 2013   CHOLECYSTECTOMY N/A 07/26/2016   Procedure: LAPAROSCOPIC CHOLECYSTECTOMY;  Surgeon: Claudia Cuff, MD;  Location: [facility] ORS;  Service: General;  Laterality: N/A;   COLONOSCOPY N/A 05/08/2024   Procedure: COLONOSCOPY;  Surgeon: Marnee Sink, MD;  Location: [facility] ENDOSCOPY;  Service: Endoscopy;  Laterality: N/A;   COLONOSCOPY WITH PROPOFOL  N/A 02/09/2022   Procedure: COLONOSCOPY WITH PROPOFOL ;  Surgeon: Marnee Sink, MD;  Location: [facility] ENDOSCOPY;  Service: Endoscopy;  Laterality: N/A;   CORONARY ARTERY BYPASS GRAFT  05/10/2012   3 vessel   ENTEROSCOPY N/A 05/16/2023   Procedure: ENTEROSCOPY;  Surgeon: Luke Salaam, MD;  Location: [facility] ENDOSCOPY;  Service: Gastroenterology;  Laterality: N/A;   ENTEROSCOPY N/A 06/01/2023   Procedure: ENTEROSCOPY;  Surgeon: Selena Daily, MD;  Location: [facility] ENDOSCOPY;  Service: Gastroenterology;  Laterality: N/A;   ESOPHAGOGASTRODUODENOSCOPY  05/13/2023   Procedure: ESOPHAGOGASTRODUODENOSCOPY (EGD);  Surgeon: Marnee Sink, MD;  Location: [facility] ENDOSCOPY;  Service: Endoscopy;;   ESOPHAGOGASTRODUODENOSCOPY (EGD) WITH PROPOFOL  N/A 10/28/2022   Procedure: ESOPHAGOGASTRODUODENOSCOPY (EGD) WITH PROPOFOL ;  Surgeon: Quintin Buckle, DO;  Location: [facility] ENDOSCOPY;  Service: Gastroenterology;  Laterality: N/A;   GIVENS CAPSULE STUDY  05/13/2023   Procedure: GIVENS CAPSULE STUDY;  Surgeon: Marnee Sink, MD;  Location: [facility] ENDOSCOPY;  Service: Endoscopy;;  GIVENS CAPSULE STUDY  06/01/2023   Procedure: GIVENS CAPSULE STUDY;  Surgeon: Selena Daily, MD;  Location: [facility] ENDOSCOPY;  Service: Gastroenterology;;    Medications Prior to Admission  Medication Sig Dispense Refill Last Dose/Taking   albuterol  (VENTOLIN  HFA) 108 (90 Base) MCG/ACT inhaler Inhale 2 puffs into the lungs  every 6 (six) hours as needed for wheezing or shortness of breath. (Patient not taking: Reported on 10/16/2023) 8 g 2    aspirin  EC (ASPIRIN  LOW DOSE) 81 MG tablet Take 81 mg by mouth daily. (Patient not taking: Reported on 05/06/2024)   Not Taking   cetirizine (ZYRTEC) 10 MG tablet Take 10 mg by mouth daily. (Patient not taking: Reported on 05/06/2024)   Not Taking   donepezil  (ARICEPT ) 10 MG tablet Take 10 mg by mouth daily.  (Patient not taking: Reported on 05/06/2024)   Not Taking   escitalopram  (LEXAPRO ) 5 MG tablet Take 5 mg by mouth daily. (Patient not taking: Reported on 05/06/2024)   Not Taking   fluticasone  (FLONASE ) 50 MCG/ACT nasal spray Place 2 sprays into both nostrils daily. (Patient not taking: Reported on 05/06/2024)   Not Taking   Melatonin 10 MG CAPS Take 1 capsule by mouth at bedtime. (Patient not taking: Reported on 05/06/2024)   Not Taking   naltrexone  (DEPADE) 50 MG tablet Take 50 mg by mouth daily. (Patient not taking: Reported on 05/06/2024)   Not Taking   omeprazole  (PRILOSEC) 20 MG capsule Take 1 capsule (20 mg total) by mouth 2 (two) times daily before a meal. (Patient not taking: Reported on 05/06/2024) 60 capsule 0 Not Taking   polyethylene glycol (MIRALAX ) 17 g packet Take 17 g by mouth daily. (Patient not taking: Reported on 05/06/2024) 14 each 0 Not Taking   pravastatin  (PRAVACHOL ) 20 MG tablet Take 20 mg by mouth at bedtime.  (Patient not taking: Reported on 05/06/2024)   Not Taking   sodium phosphate (FLEET) 7-19 GM/118ML ENEM Place 133 mLs (1 enema total) rectally once as needed for up to 1 dose for severe constipation. (Patient not taking: Reported on 05/06/2024) 133 mL 0 Not Taking   TRELEGY ELLIPTA 100-62.5-25 MCG/ACT AEPB Inhale 1 puff into the lungs daily. (Patient not taking: Reported on 05/06/2024)   Not Taking   vitamin B-12 (CYANOCOBALAMIN ) 1000 MCG tablet Take 1 tablet (1,000 mcg total) by mouth daily. (Patient not taking: Reported on 05/06/2024) 30 tablet 0 Not Taking    Social History   Socioeconomic History   Marital status: Divorced    Spouse name: Not on file   Number of children: Not on file   Years of education: Not on file   Highest education level: Not on file  Occupational History   Not on file  Tobacco Use   Smoking status: Former    Current packs/day: 0.00    Types: Cigarettes    Quit date: 23    Years since quitting: 32.4   Smokeless tobacco: Current    Types: Snuff   Tobacco comments:    changed to dip  Vaping Use   Vaping status: Never Used  Substance and Sexual Activity   Alcohol  use: Yes    Alcohol /week: 26.0 standard drinks of alcohol     Types: 26 Cans of beer per week   Drug use: No   Sexual activity: Yes    Birth control/protection: None  Other Topics Concern   Not on file  Social History Narrative   Not on file   Social Drivers of Health  Financial Resource Strain: Not on file  Food Insecurity: No Food Insecurity (05/07/2024)   Hunger Vital Sign    Worried About Running Out of Food in the Last Year: Never true    Ran Out of Food in the Last Year: Never true  Transportation Needs: No Transportation Needs (05/07/2024)   PRAPARE - Administrator, Civil Service (Medical): No    Lack of Transportation (Non-Medical): No  Physical Activity: Not on file  Stress: Not on file  Social Connections: Socially Isolated (05/07/2024)   Social Connection and Isolation Panel [NHANES]    Frequency of Communication with Friends and Family: Never    Frequency of Social Gatherings with Friends and Family: Never    Attends Religious Services: Never    Database administrator or Organizations: No    Attends Banker Meetings: Never    Marital Status: Divorced  Catering manager Violence: Not At Risk (05/07/2024)   Humiliation, Afraid, Rape, and Kick questionnaire    Fear of Current or Ex-Partner: No    Emotionally Abused: No    Physically Abused: No    Sexually Abused: No    Family History  Problem  Relation Age of Onset   Pneumonia Mother      Vitals:   05/13/24 0930 05/13/24 1000 05/13/24 1100 05/13/24 1200  BP: (!) 138/95 117/80 (!) 98/46 128/76  Pulse: 94 86 79 94  Resp: 18 13 12 19   Temp:    97.8 F (36.6 C)  TempSrc:    Oral  SpO2: 94% 96% 100% 98%  Weight:      Height:        PHYSICAL EXAM General: Chronically ill-appearing elderly male, well nourished, in no acute distress. HEENT: Normocephalic and atraumatic. Neck: No JVD.   Lungs: Normal respiratory effort on room air. Bilaterally diminished. Heart: HRRR. Normal S1 and S2 without gallops, + murmur  Abdomen: Non-distended appearing.  Msk: Normal strength and tone for age. Extremities: Warm and well perfused. No clubbing, cyanosis. No edema.  Neuro: Alert and oriented X 3. Psych: Answers questions appropriately.   Labs: Basic Metabolic Panel: Recent Labs    05/12/24 0214 05/13/24 0326  NA 134* 138  K 3.8 3.8  CL 99 100  CO2 26 28  GLUCOSE 128* 179*  BUN 11 11  CREATININE 0.91 0.88  CALCIUM  8.4* 8.6*  MG 1.9 2.1  PHOS 3.2 3.3   Liver Function Tests: No results for input(s): "AST", "ALT", "ALKPHOS", "BILITOT", "PROT", "ALBUMIN" in the last 72 hours.  No results for input(s): "LIPASE", "AMYLASE" in the last 72 hours. CBC: Recent Labs    05/12/24 0214 05/13/24 0326  WBC 16.0* 13.0*  HGB 9.5* 9.4*  HCT 29.6* 29.4*  MCV 89.4 90.7  PLT 285 273   Cardiac Enzymes: No results for input(s): "CKTOTAL", "CKMB", "CKMBINDEX", "TROPONINIHS" in the last 72 hours.  BNP: No results for input(s): "BNP" in the last 72 hours.  D-Dimer: No results for input(s): "DDIMER" in the last 72 hours. Hemoglobin A1C: No results for input(s): "HGBA1C" in the last 72 hours. Fasting Lipid Panel: No results for input(s): "CHOL", "HDL", "LDLCALC", "TRIG", "CHOLHDL", "LDLDIRECT" in the last 72 hours. Thyroid Function Tests: No results for input(s): "TSH", "T4TOTAL", "T3FREE", "THYROIDAB" in the last 72  hours.  Invalid input(s): "FREET3" Anemia Panel: No results for input(s): "VITAMINB12", "FOLATE", "FERRITIN", "TIBC", "IRON ", "RETICCTPCT" in the last 72 hours.    Radiology: ECHOCARDIOGRAM COMPLETE Result Date: 05/10/2024    ECHOCARDIOGRAM REPORT  Patient Name:   [name] Date of Exam: 05/10/2024 Medical Rec #:  [id number]    Height:       72.0 in Accession #:    [id number]   Weight:       230.0 lb Date of Birth:  Nov 16, 1961     BSA:          2.261 m Patient Age:    63 years     BP:           103/58 mmHg Patient Gender: M            HR:           92 bpm. Exam Location:  [facility] Procedure: 2D Echo, Cardiac Doppler and Color Doppler (Both Spectral and Color            Flow Doppler were utilized during procedure). Indications:     Chest Pain R07.9  History:         Patient has prior history of Echocardiogram examinations.                  Stroke.  Sonographer:     Kathaleen Pale Roar Referring Phys:  [id number] Creighton Doffing Diagnosing Phys: Antonette Batters MD IMPRESSIONS  1. Left ventricular ejection fraction, by estimation, is 60 to 65%. The left ventricle has normal function. The left ventricle has no regional wall motion abnormalities. The left ventricular internal cavity size was mildly dilated. Left ventricular diastolic parameters are consistent with Grade I diastolic dysfunction (impaired relaxation).  2. Right ventricular systolic function is normal. The right ventricular size is normal.  3. The mitral valve is normal in structure. No evidence of mitral valve regurgitation.  4. The aortic valve is normal in structure. Aortic valve regurgitation is trivial. Severe aortic valve stenosis. FINDINGS  Left Ventricle: Left ventricular ejection fraction, by estimation, is 60 to 65%. The left ventricle has normal function. The left ventricle has no regional wall motion abnormalities. Strain was performed and the global longitudinal strain is indeterminate. The left ventricular internal cavity size was mildly  dilated. There is borderline left ventricular hypertrophy. Left ventricular diastolic parameters are consistent with Grade I diastolic dysfunction (impaired relaxation). Right Ventricle: The right ventricular size is normal. No increase in right ventricular wall thickness. Right ventricular systolic function is normal. Left Atrium: Left atrial size was normal in size. Right Atrium: Right atrial size was normal in size. Pericardium: There is no evidence of pericardial effusion. Mitral Valve: The mitral valve is normal in structure. No evidence of mitral valve regurgitation. MV peak gradient, 10.1 mmHg. The mean mitral valve gradient is 5.0 mmHg. Tricuspid Valve: The tricuspid valve is normal in structure. Tricuspid valve regurgitation is trivial. Aortic Valve: The aortic valve is normal in structure. Aortic valve regurgitation is trivial. Aortic regurgitation PHT measures 498 msec. Severe aortic stenosis is present. Aortic valve mean gradient measures 39.0 mmHg. Aortic valve peak gradient measures 66.3 mmHg. Aortic valve area, by VTI measures 0.49 cm. Pulmonic Valve: The pulmonic valve was normal in structure. Pulmonic valve regurgitation is not visualized. Aorta: The ascending aorta was not well visualized. IAS/Shunts: No atrial level shunt detected by color flow Doppler. Additional Comments: 3D was performed not requiring image post processing on an independent workstation and was indeterminate.  LEFT VENTRICLE PLAX 2D LVIDd:         5.30 cm   Diastology LVIDs:         3.50 cm   LV e' medial:  5.87 cm/s LV PW:         1.00 cm   LV E/e' medial:  22.5 LV IVS:        1.30 cm   LV e' lateral:   9.57 cm/s LVOT diam:     1.80 cm   LV E/e' lateral: 13.8 LV SV:         43 LV SV Index:   19 LVOT Area:     2.54 cm  RIGHT VENTRICLE RV Basal diam:  2.70 cm RV Mid diam:    2.40 cm RV S prime:     6.96 cm/s TAPSE (M-mode): 1.5 cm LEFT ATRIUM             Index        RIGHT ATRIUM           Index LA diam:        4.00 cm 1.77  cm/m   RA Area:     15.40 cm LA Vol (A2C):   52.9 ml 23.40 ml/m  RA Volume:   37.60 ml  16.63 ml/m LA Vol (A4C):   60.0 ml 26.54 ml/m LA Biplane Vol: 57.7 ml 25.52 ml/m  AORTIC VALVE                     PULMONIC VALVE AV Area (Vmax):    0.52 cm      PV Vmax:        1.11 m/s AV Area (Vmean):   0.49 cm      PV Peak grad:   4.9 mmHg AV Area (VTI):     0.49 cm      RVOT Peak grad: 3 mmHg AV Vmax:           407.00 cm/s AV Vmean:          295.500 cm/s AV VTI:            0.876 m AV Peak Grad:      66.3 mmHg AV Mean Grad:      39.0 mmHg LVOT Vmax:         83.80 cm/s LVOT Vmean:        57.300 cm/s LVOT VTI:          0.168 m LVOT/AV VTI ratio: 0.19 AI PHT:            498 msec  AORTA Ao Root diam: 2.50 cm Ao Asc diam:  2.60 cm MITRAL VALVE                TRICUSPID VALVE MV Area (PHT): 4.41 cm     TR Peak grad:   19.9 mmHg MV Area VTI:   1.53 cm     TR Vmax:        223.00 cm/s MV Peak grad:  10.1 mmHg MV Mean grad:  5.0 mmHg     SHUNTS MV Vmax:       1.59 m/s     Systemic VTI:  0.17 m MV Vmean:      99.8 cm/s    Systemic Diam: 1.80 cm MV Decel Time: 172 msec MV E velocity: 132.00 cm/s MV A velocity: 145.00 cm/s MV E/A ratio:  0.91 MV A Prime:    11.3 cm/s Antonette Batters MD Electronically signed by Antonette Batters MD Signature Date/Time: 05/10/2024/11:59:32 AM    Final    CT Angio Chest Pulmonary Embolism (PE) W or WO Contrast Result Date: 05/09/2024 CLINICAL DATA:  Sinus tachycardia short of  breath, chest pain EXAM: CT ANGIOGRAPHY CHEST WITH CONTRAST TECHNIQUE: Multidetector CT imaging of the chest was performed using the standard protocol during bolus administration of intravenous contrast. Multiplanar CT image reconstructions and MIPs were obtained to evaluate the vascular anatomy. RADIATION DOSE REDUCTION: This exam was performed according to the departmental dose-optimization program which includes automated exposure control, adjustment of the mA and/or kV according to patient size and/or use of iterative  reconstruction technique. CONTRAST:  75mL OMNIPAQUE  IOHEXOL  350 MG/ML SOLN COMPARISON:  CT chest 03/13/2023, chest x-ray 05/09/2024 FINDINGS: Cardiovascular: Satisfactory opacification of the pulmonary arteries to the segmental level. No evidence of pulmonary embolism. Advanced aortic atherosclerosis. No aneurysm. Post CABG changes. Coronary vascular calcification. Normal cardiac size. No pericardial effusion. Mild mitral calcification Mediastinum/Nodes: Patent trachea. No thyroid mass. Subcentimeter mediastinal lymph nodes. Right low paratracheal node measuring 10 mm. Esophagus within normal limits. Lungs/Pleura: Emphysema. Trace pleural effusions. Heterogeneous peribronchovascular consolidations in the left lower lobe with smaller foci of irregular airspace disease in the right upper lobe and posterior left upper lobe, suspicious for multifocal infection Upper Abdomen: No acute finding. Borderline to slightly enlarged spleen Musculoskeletal: No acute or suspicious osseous abnormality. Sternotomy Review of the MIP images confirms the above findings. IMPRESSION: 1. Negative for acute pulmonary embolus. 2. Emphysema. Heterogeneous consolidations in the left lower lobe with smaller foci of irregular airspace disease in the right upper lobe and posterior left upper lobe, suspicious for multifocal infection. Trace pleural effusions. Imaging follow-up to resolution is recommended. 3. Aortic atherosclerosis. Aortic Atherosclerosis (ICD10-I70.0) and Emphysema (ICD10-J43.9). Electronically Signed   By: Esmeralda Hedge M.D.   On: 05/09/2024 19:48   DG Chest Port 1 View Result Date: 05/09/2024 CLINICAL DATA:  Shortness of breath. EXAM: PORTABLE CHEST 1 VIEW COMPARISON:  05/08/2024 FINDINGS: Interval development of patchy left parahilar airspace disease, suspicious for pneumonia lungs are hyperexpanded. Interstitial markings are diffusely coarsened with chronic features. The cardio pericardial silhouette is enlarged. No acute  bony abnormality. Telemetry leads overlie the chest. IMPRESSION: 1. Interval development of patchy left parahilar airspace disease, suspicious for pneumonia. 2. Hyperexpansion with chronic interstitial coarsening. Electronically Signed   By: Donnal Fusi M.D.   On: 05/09/2024 05:33   DG Chest Port 1 View Result Date: 05/08/2024 CLINICAL DATA:  Shortness of breath. EXAM: PORTABLE CHEST 1 VIEW COMPARISON:  May 08, 2024 (3:02 p.m.) FINDINGS: Multiple sternal wires are present. The heart size and mediastinal contours are within normal limits. Mild right apical linear scarring and/or atelectasis is seen. Mild left basilar atelectasis and/or infiltrate is also noted. This represents a new finding when compared to the earlier study. No pleural effusion or pneumothorax is seen. The visualized skeletal structures are unremarkable. IMPRESSION: 1. Evidence of prior median sternotomy. 2. Mild left basilar atelectasis and/or infiltrate. 3. Mild right apical linear scarring and/or atelectasis. Electronically Signed   By: Virgle Grime M.D.   On: 05/08/2024 20:02   DG Chest Port 1 View Result Date: 05/08/2024 CLINICAL DATA:  Shortness of breath. EXAM: PORTABLE CHEST 1 VIEW COMPARISON:  06/07/2023 FINDINGS: Prior median sternotomy. Stable heart size and mediastinal contours. Chronic hyperinflation and bronchial thickening. No evidence of acute airspace disease. No pneumothorax or significant pleural effusion. Normal pulmonary vasculature. IMPRESSION: Chronic hyperinflation and bronchial thickening. No acute findings. Electronically Signed   By: Chadwick Colonel M.D.   On: 05/08/2024 18:22    ECHO as above  TELEMETRY reviewed by me 05/13/2024: sinus rhythm, rate 100s  EKG reviewed by me: Sinus tachycardia, without significant  ischemic changes  Data reviewed by me 05/13/2024: last 24h vitals tele labs imaging I/O ED provider note, admission H&P.  Principal Problem:   GI bleeding Active Problems:    Dyslipidemia   Depression   Chronic obstructive pulmonary disease (COPD) (HCC)    ASSESSMENT AND PLAN:  [name] is a 63 y.o. male  with a past medical history of CAD s/p CABG x 3 (04/2012), chronic HFpEF, moderate to severe aortic stenosis, hypertension, hyperlipidemia history of CVA, alcohol  use who presented to the ED on 05/06/2024 for epigastric pain and dark stools.  Patient underwent colonoscopy due to found melena with AVMs on capsule endoscopy.  After colonoscopy patient chest pain that radiates to bilateral shoulders.  Troponin found to be elevated.  Cardiology was consulted for further evaluation.   # GI bleed, AVMs # NSTEMI # CAD s/p CABG (2013) # Chronic HFpEF # Moderate to severe aortic stenosis # COPD # History CVA # Pneumonia Patient presents to the ED with epigastric pain and melena. Found to have a GI bleed and underwent colonoscopy today (05/20). Patient received transfusion on 5/21 due to hemoglobin of 6.7, today Hgb 8.1. Trops elevated 200 > 300 > 700 > 600 > 1176 > 1167. EKG without acute ischemic changes. Echo this admission with pEF, no RWMA.  -Patient reports not taking ASA at home. Will defer starting at this time due to low Hgb. -Continue pravastatin  20 mg daily. -Continue Imdur 30 mg daily. -Continue midodrine 5 mg TID. Wean off when BP allow.  -Hold BB for now until BP stabilizes.  -Continue to monitor H&H. Goal Hgb > 9. 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 It was found on his last endoscopy and could not be reached to cauterize".  Anticoagulation would be patient at high risk for bleeding. Will defer heparin  and LHC at this time due to recent GI bleed and high risk for bleeding.   This patient's plan of care was discussed and created with Dr. Custovic and she is in agreement.  Signed: Creighton Doffing, PA-C  05/13/2024, 1:49 PM [facility] Cardiology

## 2024-05-13 NOTE — Progress Notes (Signed)
 [facility] Progress Note  Patient: [name]    RUE:[id number]  DOA: 05/06/2024     Date of Service: the patient was seen and examined on 05/13/2024  Chief Complaint  Patient presents with   Dizziness   Blood In Stools   Brief hospital course: [name] is a 63 y.o. Caucasian male with PMH of PMH of 3vCAD w/ hx inferior STEMI (2013) s/p PCI with stent RCA + CABG x 3 ([facility]), moderate-severe aortic stenosis (10/2022), HFpEF, HTN, COPD, CVA, who presented to the ED with c/o melena over the last few weeks.  No bright red bleeding, no nausea vomiting, no abdominal pain. C/o chest pain and bilateral shoulder ache which resolved.   ED Course: VS BP 92/77 HR 109 and RR 22  Labs: calcium  8.6 and albumin 3.1 with otherwise unremarkable CMP.  High-sensitivity troponin was 15 twice.   CBC: wbc 12.7 with neutrophilia and anemia with Hb 8.3  Blood glucose A+ with negative antibody screen. EKG as reviewed by me : EKG showed sinus tachycardia with a rate of 108 with biatrial enlargement. Imaging: None.   The patient was 20 mg of IV Pepcid .  He will be admitted to a medical telemetry bed for further evaluation and management.   Assessment and Plan:  # NSTEMI # 3vCAD w/ hx inferior STEMI (2013) s/p PCI with stent RCA + CABG x 3 ([facility]), moderate-severe aortic stenosis (10/2022), HFpEF, HTN Patient had chest pain on arrival and bilateral shoulder ache which resolved, tropes were negative. 5/22 again patient had chest pain and shoulder ache after colonoscopy S/p nitroglycerin x 3 doses given and due to respiratory failure and severe chest pain patient was transferred to stepdown unit Troponin peaked 1176  Cardiology consulted, started Lopressor  25 mg p.o. twice daily and Imdur 30 mg p.o. daily, not a good candidate for anticoagulation heparin  infusion due to high risk for bleeding 5/24 low blood pressure, discontinued Lopressor  and started midodrine 10 mg p.o. 3 times daily.  Follow holding  parameters for Imdur 5/24TTE: LVEF 60 to 65%, grade 1 diastolic function.  Severe aortic stenosis. D/w cardiology patient is not a good candidate for any intervention for aortic stenosis due to risk of bleeding. Cardiology Recommend correcting anemia up to 9 or 10 hemoglobin because of his cardiovascular symptoms  5/27 cardio increase Imdur from 30 to 60 mg p.o. daily and cleared for discharge.  Signed off  # Hypotension, Resolved  S/p midodrine 10 mg p.o. 3 times daily, held beta-blocker for now BP improved, midodrine changed to as needed Monitor BP and titrate medication accordingly   # Aspiration pneumonia Possible aspiration during colonoscopy CXR consistent with pneumonia S/p Azithromycin  x 5 days and switch ceftriaxone  to Zosyn to cover anaerobes.  Continue Zosyn for total 7 days Continue supplemental O2 inhalation and gradually wean off Leukocytosis, trend WBC count  # Chronic obstructive pulmonary disease (COPD) # Chronic hypoxic respiratory failure on 2 L O2 at baseline  - Continue inhalers.   # GI bleeding, recurrent.  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  At that time it was recommended to approach the lesions via lower GI tract to find and cauterize. Continue pantoprazole  40 mg IV twice daily GI consulted, s/p colonoscopy, no source of bleeding identified.  GI recommended CTA and IR embolization of GI bleeding reoccurs.  5/25 Hb 7.3, keep hemoglobin 8-9 as per cardio, transfuse 1 unit PRBC 5/27  Hb 9.4 stable Monitor H&H.   # Acute blood loss anemia due to GI bleeding as above 5/21, Hb 6.7, 1 unit of PRBC transfused 5/24 Hb 6.9, transfused 1 unit PRBC 5/25 Hb 7.3, transfused 1 unit PRBC 5/27 Hb 9.4 stable Monitor H&H and transfuse as needed   # Iron  deficiency, transferrin saturation 13% 5/21 Venofer 300 mg IV one-time dose given followed by oral supplement #  Folate level 7.0 at lower end, started folic acid  1 mg p.o. daily # Vitamin D deficiency: started vitamin D 50,000 units p.o. weekly, follow with PCP to repeat vitamin D level after 3 to 6 months.   # Depression: Continue Lexapro . # Dyslipidemia: continue statin therapy.   Body mass index is 31.19 kg/m.  Interventions:   Diet: Heart healthy diet DVT Prophylaxis: SCD, pharmacological prophylaxis contraindicated due to GI bleeding   Advance goals of care discussion: Full code  Family Communication: family was not present at bedside, at the time of interview.  The pt provided permission to discuss medical plan with the family. Opportunity was given to ask question and all questions were answered satisfactorily.   Disposition:  Pt is from home, admitted with GI bleeding, GI consulted, s/p colonoscopy on 5/22, and developed NSTEMI and aspiration pneumonia after colonoscopy, which precludes a safe discharge. Discharge to home, when stable, most likely in few days  Subjective: No significant events overnight, patient was seen after ambulating with physical therapy, patient was feeling dyspnea on exertion and felt lightheaded.  Symptoms resolved at rest.  Most likely it is due to severe aortic valve stenosis.  Patient was asymptomatic at rest.  Complaining of lower backache but no chest pain.  Denied any worsening of shortness of breath.   Physical Exam: General: NAD, lying comfortably Appear in no distress, affect appropriate Eyes: PERRLA ENT: Oral Mucosa Clear, moist  Neck: no JVD,  Cardiovascular: S1 and S2 Present, audible murmur,  Respiratory: Equal air entry bilaterally, mild Crackles, no wheezes Abdomen: Bowel Sound present, Soft, obese and no tenderness,  Skin: no rashes Extremities: no Pedal edema, no calf tenderness Neurologic: without any new focal findings Gait not checked due to patient safety concerns  Vitals:   05/13/24 1300 05/13/24 1400 05/13/24 1500 05/13/24 1530   BP:   113/71 113/71  Pulse: 84 83 80 90  Resp: 15 16 12 16   Temp:      TempSrc:      SpO2: 94% 93% 94% 98%  Weight:      Height:        Intake/Output Summary (Last 24 hours) at 05/13/2024 1616 Last data filed at 05/13/2024 1532 Gross per 24 hour  Intake 919.8 ml  Output 2100 ml  Net -1180.2 ml   Filed Weights   05/06/24 1758  Weight: 104.3 kg    Data Reviewed: I have personally reviewed and interpreted daily labs, tele strips, imagings as discussed above. I reviewed all nursing notes, pharmacy notes, vitals, pertinent old records I have discussed plan of care as described above with RN and patient/family.  CBC: Recent Labs  Lab 05/06/24 1812 05/06/24 2226 05/09/24 0314 05/10/24 0306 05/10/24 1724 05/11/24 0249 05/11/24 1536 05/12/24 0214 05/13/24 0326  WBC 12.7*   < > 19.0* 19.0*  --  16.0*  --  16.0* 13.0*  NEUTROABS 8.9*  --   --   --   --   --   --   --   --   HGB 8.3*   < > 8.0* 6.9*  7.8* 7.3* 8.8* 9.5* 9.4*  HCT 27.0*   < > 25.2* 22.4* 24.0* 23.1* 27.4* 29.6* 29.4*  MCV 87.7   < > 88.4 91.8  --  90.6  --  89.4 90.7  PLT 314   < > 249 256  --  225  --  285 273   < > = values in this interval not displayed.   Basic Metabolic Panel: Recent Labs  Lab 05/09/24 0314 05/10/24 0306 05/11/24 0249 05/12/24 0214 05/13/24 0326  NA 135 140 137 134* 138  K 4.0 4.5 3.9 3.8 3.8  CL 103 102 101 99 100  CO2 23 31 28 26 28   GLUCOSE 161* 125* 129* 128* 179*  BUN 11 14 14 11 11   CREATININE 0.87 1.10 1.23 0.91 0.88  CALCIUM  8.7* 8.8* 8.3* 8.4* 8.6*  MG 1.7 2.3 1.8 1.9 2.1  PHOS 4.4 4.5 4.0 3.2 3.3    Studies: No results found.   Scheduled Meds:  vitamin C  500 mg Oral Daily   Chlorhexidine Gluconate Cloth  6 each Topical Daily   donepezil   10 mg Oral QHS   escitalopram   5 mg Oral Daily   feeding supplement  237 mL Oral BID BM   fluticasone   2 spray Each Nare Daily   folic acid   1 mg Oral Daily   iron  polysaccharides  150 mg Oral Daily   [START ON  05/14/2024] isosorbide mononitrate  30 mg Oral Daily   loratadine   10 mg Oral Daily   melatonin  10 mg Oral QHS   naltrexone   50 mg Oral Daily   pantoprazole   40 mg Oral BID   pravastatin   20 mg Oral QHS   Vitamin D (Ergocalciferol)  50,000 Units Oral Q7 days   Continuous Infusions:  piperacillin-tazobactam (ZOSYN)  IV 12.5 mL/hr at 05/13/24 1532    PRN Meds: acetaminophen  **OR** acetaminophen , alum & mag hydroxide-simeth, ipratropium-albuterol , midodrine, morphine  injection, nitroGLYCERIN, ondansetron  **OR** ondansetron  (ZOFRAN ) IV, mouth rinse, traZODone   Time spent: 40 minutes  Author: Althia Atlas. MD [facility] 05/13/2024 4:16 PM  To reach On-call, see care teams to locate the attending and reach out to them via [email or web address]. If 7PM-7AM, please contact night-coverage If you still have difficulty reaching the attending provider, please page the [facility] (Director on Call) for [facility] on amion for assistance.

## 2024-05-13 NOTE — Evaluation (Signed)
 Physical Therapy Evaluation Patient Details Name: [name] MRN: [id number] DOB: 1961-01-17 Today's Date: 05/13/2024  History of Present Illness  [name] is a 63 y.o. male  with a past medical history of CAD s/p CABG x 3 (04/2012), chronic HFpEF, moderate to severe aortic stenosis, hypertension, hyperlipidemia history of CVA, alcohol  use who presented to the ED on 05/06/2024 for epigastric pain and dark stools.  Patient underwent colonoscopy due to found melena with AVMs on capsule endoscopy.  After colonoscopy patient reported chest pain, noted elevated troponins, received multiple doses of morphine  and nitroglycerin . Chest x-Alaycia Eardley revealed pneumonia  Clinical Impression  Pt is a pleasant 63 year old male who was admitted for GI bleed. Pt performs transfers with supervision and ambulation with CGA and RW. All mobility performed on 2L of O2 with sats WNL. Close chair follow provided with exertion, however not needed. Pt demonstrates deficits with strength/endurance/mobility. Pt reports dizziness with all exertion with BP at 147/74 post exertion. Would benefit from skilled PT to address above deficits and promote optimal return to PLOF. Pt will continue to receive skilled PT services while admitted and will defer to TOC/care team for updates regarding disposition planning.       If plan is discharge home, recommend the following: A little help with walking and/or transfers   Can travel by private vehicle        Equipment Recommendations None recommended by PT  Recommendations for Other Services       Functional Status Assessment Patient has had a recent decline in their functional status and demonstrates the ability to make significant improvements in function in a reasonable and predictable amount of time.     Precautions / Restrictions Precautions Precautions: Fall Recall of Precautions/Restrictions: Intact Restrictions Weight Bearing Restrictions Per Provider Order: No       Mobility  Bed Mobility               General bed mobility comments: NT, received at EOB    Transfers Overall transfer level: Needs assistance Equipment used: Rolling walker (2 wheels) Transfers: Sit to/from Stand Sit to Stand: Supervision           General transfer comment: safe technique. Cued to stand for several seconds to ensure dizziness resolved.    Ambulation/Gait Ambulation/Gait assistance: Contact guard assist Gait Distance (Feet): 200 Feet Assistive device: Rolling walker (2 wheels) Gait Pattern/deviations: Step-through pattern       General Gait Details: ambulated in hallway. All mobility performed on 2L of O2 with sats WNL. Reciprocal gait pattern noted with decreased foot clearance during stepping needing reminders frequently. Close chair follow as pt still complaining of dizziness. Pt rates exertion of 7/10 with mobility efforts.  Stairs            Wheelchair Mobility     Tilt Bed    Modified Rankin (Stroke Patients Only)       Balance Overall balance assessment: Needs assistance Sitting-balance support: No upper extremity supported, Feet supported Sitting balance-Leahy Scale: Normal     Standing balance support: No upper extremity supported, During functional activity Standing balance-Leahy Scale: Fair                               Pertinent Vitals/Pain Pain Assessment Pain Assessment: No/denies pain    Home Living Family/patient expects to be discharged to:: Private residence Living Arrangements: Alone Available Help at Discharge: Friend(s);Available PRN/intermittently Type of Home: House Home  Access: Level entry       Home Layout: Two level;Able to live on main level with bedroom/bathroom Home Equipment: Agricultural consultant (2 wheels) Additional Comments: reports living in a friends "guest house"    Prior Function Prior Level of Function : Independent/Modified Independent             Mobility Comments:  reports multiple falls recently, previously not using AD ADLs Comments: 2L O2 baseline PRN at home; eats out for all meals     Extremity/Trunk Assessment   Upper Extremity Assessment Upper Extremity Assessment: Overall WFL for tasks assessed    Lower Extremity Assessment Lower Extremity Assessment: Generalized weakness (B LE grossly 4/5)       Communication   Communication Communication: Impaired Factors Affecting Communication: Hearing impaired    Cognition Arousal: Alert Behavior During Therapy: WFL for tasks assessed/performed, Flat affect   PT - Cognitive impairments: No apparent impairments                       PT - Cognition Comments: pleasant Following commands: Intact       Cueing Cueing Techniques: Verbal cues     General Comments      Exercises     Assessment/Plan    PT Assessment Patient needs continued PT services  PT Problem List Decreased strength;Decreased balance;Decreased mobility;Decreased safety awareness;Decreased knowledge of precautions;Cardiopulmonary status limiting activity       PT Treatment Interventions DME instruction;Gait training;Therapeutic exercise;Balance training    PT Goals (Current goals can be found in the Care Plan section)  Acute Rehab PT Goals Patient Stated Goal: to go home PT Goal Formulation: With patient Time For Goal Achievement: 05/27/24 Potential to Achieve Goals: Good    Frequency Min 2X/week     Co-evaluation               AM-PAC PT "6 Clicks" Mobility  Outcome Measure Help needed turning from your back to your side while in a flat bed without using bedrails?: None Help needed moving from lying on your back to sitting on the side of a flat bed without using bedrails?: None Help needed moving to and from a bed to a chair (including a wheelchair)?: None Help needed standing up from a chair using your arms (e.g., wheelchair or bedside chair)?: None Help needed to walk in hospital room?:  None Help needed climbing 3-5 steps with a railing? : None 6 Click Score: 24    End of Session Equipment Utilized During Treatment: Oxygen Activity Tolerance: Patient tolerated treatment well Patient left: in bed (seated on bed) Nurse Communication: Mobility status PT Visit Diagnosis: Muscle weakness (generalized) (M62.81);Difficulty in walking, not elsewhere classified (R26.2);Repeated falls (R29.6)    Time: 3664-4034 PT Time Calculation (min) (ACUTE ONLY): 17 min   Charges:   PT Evaluation $PT Eval Low Complexity: 1 Low PT Treatments $Gait Training: 8-22 mins PT General Charges $$ ACUTE PT VISIT: 1 Visit         Amparo Balk, PT, DPT, GCS [phone]   Abdulla Pooley 05/13/2024, 2:16 PM

## 2024-05-13 NOTE — Evaluation (Signed)
 Occupational Therapy Evaluation Patient Details Name: Amier Hoyt MRN: [id number] DOB: [birth year] Today's Date: 05/13/2024   History of Present Illness   Manson Luckadoo is a 63 y.o. male  with a past medical history of CAD s/p CABG x 3 (04/2012), chronic HFpEF, moderate to severe aortic stenosis, hypertension, hyperlipidemia history of CVA, alcohol  use who presented to the ED on 05/06/2024 for epigastric pain and dark stools.  Patient underwent colonoscopy due to found melena with AVMs on capsule endoscopy.  After colonoscopy patient reported chest pain, noted elevated troponins, received multiple doses of morphine  and nitroglycerin. Chest x-ray revealed pneumonia    Clinical Impressions Mr Jacob was seen for OT evaluation this date. Prior to hospital admission, pt was IND no AD use however endorses ~15 falls in 6 months. Pt lives alone in level entry home. Pt currently requires MIN A don B socks in sitting. CGA for toilet t/f ~10 ft. SBA simulated standing grooming tasks. Further mobility deferred as pt reports lightheaded/dizziness with standing - RN notified. Pt would benefit from skilled OT to address noted impairments and functional limitations (see below for any additional details). Upon hospital discharge, recommend OT follow up.   Seated BP 128/80, HR 90, SpO2 93% on RA.  Standing BP 110/63, HR 99, SpO2 95% on 2L [place] - endorses dizziness, unable to tolerate 3' standing BP     If plan is discharge home, recommend the following:   A little help with bathing/dressing/bathroom;Direct supervision/assist for medications management     Functional Status Assessment   Patient has had a recent decline in their functional status and demonstrates the ability to make significant improvements in function in a reasonable and predictable amount of time.     Equipment Recommendations   BSC/3in1     Recommendations for Other Services         Precautions/Restrictions         Mobility  Bed Mobility               General bed mobility comments: not tested    Transfers Overall transfer level: Needs assistance Equipment used: None Transfers: Sit to/from Stand Sit to Stand: Supervision                  Balance Overall balance assessment: Needs assistance Sitting-balance support: No upper extremity supported, Feet supported Sitting balance-Leahy Scale: Normal     Standing balance support: No upper extremity supported, During functional activity Standing balance-Leahy Scale: Fair                             ADL either performed or assessed with clinical judgement   ADL Overall ADL's : Needs assistance/impaired                                       General ADL Comments: MIN A don B socks in sitting. CGA for toilet t/f ~10 ft.      Vision         Perception         Praxis         Pertinent Vitals/Pain Pain Assessment Pain Assessment: No/denies pain     Extremity/Trunk Assessment Upper Extremity Assessment Upper Extremity Assessment: Overall WFL for tasks assessed   Lower Extremity Assessment Lower Extremity Assessment: Generalized weakness       Communication Communication Communication: Impaired Factors Affecting  Communication: Hearing impaired   Cognition Arousal: Alert Behavior During Therapy: WFL for tasks assessed/performed, Flat affect Cognition: No apparent impairments                               Following commands: Intact       Cueing  General Comments      Seated BP 128/80, SpO2 93% on RA. Standing BP 110/63, HR 99, SpO2 95% on 2L [place]   Exercises     Shoulder Instructions      Home Living Family/patient expects to be discharged to:: Private residence Living Arrangements: Alone Available Help at Discharge: Friend(s);Available PRN/intermittently Type of Home: House Home Access: Level entry     Home Layout: Two level;Able to live on main level with  bedroom/bathroom     Bathroom Shower/Tub: Chief Strategy Officer: Standard Bathroom Accessibility: Yes How Accessible: Accessible via walker Home Equipment: Rolling Walker (2 wheels)   Additional Comments: reports living in a friends "guest house"      Prior Functioning/Environment Prior Level of Function : Independent/Modified Independent               ADLs Comments: 2L O2 baseline PRN at home; eats out for all meals    OT Problem List: Decreased range of motion;Decreased activity tolerance;Decreased strength;Impaired balance (sitting and/or standing)   OT Treatment/Interventions: Self-care/ADL training;Therapeutic exercise;Energy conservation;DME and/or AE instruction;Therapeutic activities;Patient/family education      OT Goals(Current goals can be found in the care plan section)   Acute Rehab OT Goals Patient Stated Goal: to go home OT Goal Formulation: With patient Time For Goal Achievement: 05/27/24 Potential to Achieve Goals: Good ADL Goals Pt Will Perform Grooming: with modified independence;standing Pt Will Transfer to Toilet: with modified independence;ambulating;regular height toilet Pt Will Perform Toileting - Clothing Manipulation and hygiene: with modified independence;sitting/lateral leans   OT Frequency:  Min 2X/week    Co-evaluation              AM-PAC OT "6 Clicks" Daily Activity     Outcome Measure Help from another person eating meals?: None Help from another person taking care of personal grooming?: A Little Help from another person toileting, which includes using toliet, bedpan, or urinal?: A Little Help from another person bathing (including washing, rinsing, drying)?: A Little Help from another person to put on and taking off regular upper body clothing?: None Help from another person to put on and taking off regular lower body clothing?: A Little 6 Click Score: 20   End of Session Equipment Utilized During Treatment:  Gait belt;Oxygen Nurse Communication: Mobility status  Activity Tolerance: Patient tolerated treatment well Patient left: in chair;with call bell/phone within reach  OT Visit Diagnosis: Other abnormalities of gait and mobility (R26.89);Muscle weakness (generalized) (M62.81)                Time: 1610-9604 OT Time Calculation (min): 31 min Charges:  OT General Charges $OT Visit: 1 Visit OT Evaluation $OT Eval Moderate Complexity: 1 Mod OT Treatments $Self Care/Home Management : 8-22 mins  Gordan Latina, M.S. OTR/L  05/13/24, 10:47 AM  ascom [phone]

## 2024-05-13 NOTE — Progress Notes (Signed)
 Report called to Adelina RN on 2A, all questions answered.  Patient to be transferred to room 237 via wheelchair.

## 2024-05-13 NOTE — TOC CM/SW Note (Signed)
 Patient is not able to walk the distance required to go the bathroom, or he/she is unable to safely negotiate stairs required to access the bathroom.  A 3in1 BSC will alleviate this problem

## 2024-05-14 ENCOUNTER — Other Ambulatory Visit: Payer: Self-pay

## 2024-05-14 DIAGNOSIS — K921 Melena: Secondary | ICD-10-CM | POA: Diagnosis not present

## 2024-05-14 LAB — CBC
HCT: 29.1 % — ABNORMAL LOW (ref 39.0–52.0)
Hemoglobin: 9.1 g/dL — ABNORMAL LOW (ref 13.0–17.0)
MCH: 28.6 pg (ref 26.0–34.0)
MCHC: 31.3 g/dL (ref 30.0–36.0)
MCV: 91.5 fL (ref 80.0–100.0)
Platelets: 262 10*3/uL (ref 150–400)
RBC: 3.18 MIL/uL — ABNORMAL LOW (ref 4.22–5.81)
RDW: 19.9 % — ABNORMAL HIGH (ref 11.5–15.5)
WBC: 10.8 10*3/uL — ABNORMAL HIGH (ref 4.0–10.5)
nRBC: 0 % (ref 0.0–0.2)

## 2024-05-14 LAB — BASIC METABOLIC PANEL WITH GFR
Anion gap: 11 (ref 5–15)
BUN: 11 mg/dL (ref 8–23)
CO2: 28 mmol/L (ref 22–32)
Calcium: 8.7 mg/dL — ABNORMAL LOW (ref 8.9–10.3)
Chloride: 99 mmol/L (ref 98–111)
Creatinine, Ser: 0.96 mg/dL (ref 0.61–1.24)
GFR, Estimated: >60 mL/min (ref >60)
Glucose, Bld: 113 mg/dL — ABNORMAL HIGH (ref 70–99)
Potassium: 3.9 mmol/L (ref 3.5–5.1)
Sodium: 138 mmol/L (ref 135–145)

## 2024-05-14 LAB — MAGNESIUM: Magnesium: 2 mg/dL (ref 1.7–2.4)

## 2024-05-14 LAB — PHOSPHORUS: Phosphorus: 4.4 mg/dL (ref 2.5–4.6)

## 2024-05-14 MED ORDER — ASCORBIC ACID 500 MG PO TABS
500.0000 mg | ORAL_TABLET | Freq: Every day | ORAL | 2 refills | Status: DC
  Filled 2024-05-14: qty 30, 30d supply, fill #0

## 2024-05-14 MED ORDER — ALBUTEROL SULFATE HFA 108 (90 BASE) MCG/ACT IN AERS
2.0000 | INHALATION_SPRAY | Freq: Four times a day (QID) | RESPIRATORY_TRACT | 2 refills | Status: DC | PRN
  Filled 2024-05-14: qty 6.7, 30d supply, fill #0

## 2024-05-14 MED ORDER — POLYSACCHARIDE IRON COMPLEX 150 MG PO CAPS
150.0000 mg | ORAL_CAPSULE | Freq: Every day | ORAL | 2 refills | Status: DC
  Filled 2024-05-14: qty 30, 30d supply, fill #0

## 2024-05-14 MED ORDER — VITAMIN D (ERGOCALCIFEROL) 1.25 MG (50000 UNIT) PO CAPS
50000.0000 [IU] | ORAL_CAPSULE | ORAL | 0 refills | Status: DC
  Filled 2024-05-14: qty 4, 28d supply, fill #0

## 2024-05-14 MED ORDER — PANTOPRAZOLE SODIUM 40 MG PO TBEC
40.0000 mg | DELAYED_RELEASE_TABLET | Freq: Every day | ORAL | 2 refills | Status: DC
  Filled 2024-05-14: qty 30, 30d supply, fill #0

## 2024-05-14 MED ORDER — FOLIC ACID 1 MG PO TABS
1.0000 mg | ORAL_TABLET | Freq: Every day | ORAL | 2 refills | Status: DC
Start: 2024-05-15 — End: 2024-07-03
  Filled 2024-05-14: qty 30, 30d supply, fill #0

## 2024-05-14 MED ORDER — PRAVASTATIN SODIUM 20 MG PO TABS
20.0000 mg | ORAL_TABLET | Freq: Every day | ORAL | 11 refills | Status: DC
  Filled 2024-05-14: qty 20, 20d supply, fill #0

## 2024-05-14 MED ORDER — ISOSORBIDE MONONITRATE ER 60 MG PO TB24
60.0000 mg | ORAL_TABLET | Freq: Every day | ORAL | 11 refills | Status: DC
Start: 2024-05-15 — End: 2024-07-03
  Filled 2024-05-14: qty 30, 30d supply, fill #0

## 2024-05-14 NOTE — Care Management Important Message (Signed)
 Important Message  Patient Details  Name: Kivon Aprea MRN: [id number] Date of Birth: 1961/07/13   Important Message Given:  Yes - Medicare IM     Anise Kerns 05/14/2024, 2:04 PM

## 2024-05-14 NOTE — Progress Notes (Signed)
 Pt alert and oriented. VSS. Discharge instructions reviewed with pt. Pt verbalized understanding. Pt verbalized understanding of need to schedule/attend follow up appointments. Pt verbalized understanding of changes to medications. Prescriptions delivered to bedside. IV removed. IV site WNL. Pt left unit in wheelchair with staff. Pt left unit with 3-in-1 commode and home oxygen. Dr. Hubert Madden aware that pt is driving himself home. Pt left hospital in personal vehicle.

## 2024-05-14 NOTE — Progress Notes (Signed)
 Physical Therapy Treatment Patient Details Name: [name] MRN: [id number] DOB: [birth year] Today's Date: 05/14/2024   History of Present Illness [name] is a 63 y.o. male  with a past medical history of CAD s/p CABG x 3 (04/2012), chronic HFpEF, moderate to severe aortic stenosis, hypertension, hyperlipidemia history of CVA, alcohol  use who presented to the ED on 05/06/2024 for epigastric pain and dark stools.  Patient underwent colonoscopy due to found melena with AVMs on capsule endoscopy.  After colonoscopy patient reported chest pain, noted elevated troponins, received multiple doses of morphine  and nitroglycerin. Chest x-ray revealed pneumonia    PT Comments  Patient received in bed, he wants to take a nap, but agrees to PT session. Patient is independent with bed mobility and transfers. He ambulated ~120 feet with hand held assist. No lob, reports moderate sob. O2 sats in 90%s throughout on room air. He will continue to benefit from skilled PT to improve endurance and safety with mobility.      If plan is discharge home, recommend the following: A little help with walking and/or transfers   Can travel by private vehicle        Equipment Recommendations  Other (comment) (pulse Ox)    Recommendations for Other Services       Precautions / Restrictions Precautions Precautions: Fall Recall of Precautions/Restrictions: Intact Restrictions Weight Bearing Restrictions Per Provider Order: No     Mobility  Bed Mobility Overal bed mobility: Independent                  Transfers Overall transfer level: Needs assistance Equipment used: None Transfers: Sit to/from Stand Sit to Stand: Supervision                Ambulation/Gait Ambulation/Gait assistance: Contact guard assist Gait Distance (Feet): 120 Feet Assistive device: 1 person hand held assist Gait Pattern/deviations: Step-through pattern Gait velocity: decr     General Gait Details: Ambualted in  hallway on room air, sats mostly in 90%s. Breifly down to 89%, but quickly returned to 90%s with seated rest. No lob with minimal hha.   Stairs             Wheelchair Mobility     Tilt Bed    Modified Rankin (Stroke Patients Only)       Balance Overall balance assessment: Mild deficits observed, not formally tested Sitting-balance support: Feet supported Sitting balance-Leahy Scale: Normal     Standing balance support: Single extremity supported, During functional activity Standing balance-Leahy Scale: Good                              Communication Communication Communication: No apparent difficulties  Cognition Arousal: Alert Behavior During Therapy: WFL for tasks assessed/performed, Flat affect   PT - Cognitive impairments: No apparent impairments                       PT - Cognition Comments: pleasant Following commands: Intact      Cueing Cueing Techniques: Verbal cues  Exercises      General Comments        Pertinent Vitals/Pain Pain Assessment Pain Assessment: No/denies pain    Home Living                          Prior Function            PT Goals (current goals  can now be found in the care plan section) Acute Rehab PT Goals Patient Stated Goal: to go home PT Goal Formulation: With patient Time For Goal Achievement: 05/27/24 Potential to Achieve Goals: Good Additional Goals Additional Goal #1: Pt will be able to perform bed mobility/transfers with indep in order to improve functional indep Progress towards PT goals: Progressing toward goals    Frequency    Min 2X/week      PT Plan      Co-evaluation              AM-PAC PT "6 Clicks" Mobility   Outcome Measure  Help needed turning from your back to your side while in a flat bed without using bedrails?: None Help needed moving from lying on your back to sitting on the side of a flat bed without using bedrails?: None Help needed moving  to and from a bed to a chair (including a wheelchair)?: None Help needed standing up from a chair using your arms (e.g., wheelchair or bedside chair)?: None Help needed to walk in hospital room?: A Little Help needed climbing 3-5 steps with a railing? : A Little 6 Click Score: 22    End of Session   Activity Tolerance: Patient tolerated treatment well Patient left: in bed;with call bell/phone within reach;with bed alarm set Nurse Communication: Mobility status PT Visit Diagnosis: Muscle weakness (generalized) (M62.81);Repeated falls (R29.6)     Time: 1000-1015 PT Time Calculation (min) (ACUTE ONLY): 15 min  Charges:    $Gait Training: 8-22 mins PT General Charges $$ ACUTE PT VISIT: 1 Visit                     Tran Randle, PT, GCS 05/14/24,10:25 AM

## 2024-05-14 NOTE — TOC Transition Note (Signed)
 Transition of Care [facility]) - Discharge Note   Patient Details  Name: [name] MRN: [id number] Date of Birth: May 30, 1961  Transition of Care [facility]) CM/SW Contact:  Odilia Bennett, LCSW Phone Number: 05/14/2024, 11:39 AM   Clinical Narrative:  Patient has orders to discharge home today. [facility] liaison is aware. [employer] is working on getting him an oxygen tank for the ride home but stated there is an bill that needs to be resolved. They are discussing this with patient. No further concerns. Per RN, patient's car is in the parking lot. CSW signing off.  Final next level of care: Home w Home Health Services Barriers to Discharge: Barriers Resolved   Patient Goals and CMS Choice   CMS Medicare.gov Compare Post Acute Care list provided to:: Patient Choice offered to / list presented to : Patient      Discharge Placement                Patient to be transferred to facility by: Self   Patient and family notified of of transfer: 05/14/24  Discharge Plan and Services Additional resources added to the After Visit Summary for     Discharge Planning Services: CM Consult Post Acute Care Choice: Home Health          DME Arranged: 3-N-1 DME Agency: [employer] Date DME Agency Contacted: 05/14/24   Representative spoke with at DME Agency: Sam Creighton HH Arranged: PT, OT [facility] Agency: [facility] Date [facility] Agency Contacted: 05/14/24   Representative spoke with at [facility] Agency: [place]   Social Drivers of Health (SDOH) Interventions SDOH Screenings   Food Insecurity: No Food Insecurity (05/07/2024)  Housing: Low Risk  (05/07/2024)  Transportation Needs: No Transportation Needs (05/07/2024)  Utilities: Not At Risk (05/07/2024)  Social Connections: Socially Isolated (05/07/2024)  Tobacco Use: High Risk (10/18/2023)   Received from [facility]     Readmission Risk Interventions    05/14/2024    8:44 AM 10/27/2022   11:28 AM  Readmission Risk Prevention  Plan  Transportation Screening  Complete  PCP or Specialist Appt within 5-7 Days  Complete  PCP or Specialist Appt within 3-5 Days Complete   Home Care Screening  Complete  Medication Review (RN CM)  Complete  HRI or Home Care Consult Complete   Social Work Consult for Recovery Care Planning/Counseling Complete   Palliative Care Screening Not Applicable   Medication Review Oceanographer) Complete

## 2024-05-14 NOTE — Plan of Care (Signed)

## 2024-05-14 NOTE — Discharge Summary (Signed)
 [facility] Discharge Summary   Patient: [name] UJW:[id number]  PCP: Evette Hoes, NP  Date of admission: 05/06/2024   Date of discharge:  05/14/2024     Discharge Diagnoses:  Principal Problem:   GI bleeding Active Problems:   Dyslipidemia   Depression   Chronic obstructive pulmonary disease (COPD) (HCC)   Admitted From: Home Disposition:  Home with [facility] PT/OT  Recommendations for Outpatient Follow-up:  F/u with PCP in 1 wk F/u with Cardio in 1-2 weeks for severe AS, may need TAVR F/u GI in  1-2 weeks  Follow up LABS/TEST:  Repeat CBC in 1 wk, CXR in 4 weeks Repeat iron  profile, folate level and vitamin D level after 3 to 6 months   Follow-up Information     Paraschos, Authur Leghorn, MD. Go in 3 week(s).   Specialty: Cardiology Contact information: [address] [facility] [place] [address] [phone]         Evette Hoes, NP Follow up in 1 week(s).   Specialty: Nurse Practitioner Contact information: [address] [place] [address] [phone]         Luke Salaam, MD Follow up in 1 month(s).   Specialty: Gastroenterology Contact information: [address] [place] [address] [phone]                Diet recommendation: Cardiac diet  Activity: The patient is advised to gradually reintroduce usual activities, as tolerated  Discharge Condition: stable  Code Status: Full code   History of present illness: As per the H and P dictated on admission Hospital Course:  [name] is a 63 y.o. Caucasian male with PMH of PMH of 3vCAD w/ hx inferior STEMI (2013) s/p PCI with stent RCA + CABG x 3 ([facility]), moderate-severe aortic stenosis (10/2022), HFpEF, HTN, COPD, CVA, who presented to the ED with c/o melena over the last few weeks.  No bright red bleeding, no nausea vomiting, no abdominal pain. C/o chest pain and bilateral shoulder ache which resolved.   ED Course: VS BP 92/77 HR  109 and RR 22  Labs: calcium  8.6 and albumin 3.1 with otherwise unremarkable CMP.  High-sensitivity troponin was 15 twice.   CBC: wbc 12.7 with neutrophilia and anemia with Hb 8.3  Blood glucose A+ with negative antibody screen. EKG as reviewed by me : EKG showed sinus tachycardia with a rate of 108 with biatrial enlargement. Imaging: None.   The patient was 20 mg of IV Pepcid .  He will be admitted to a medical telemetry bed for further evaluation and management.   Med rec shows that patient stopped taking all his home medications due to self preference.   Assessment and Plan:   # NSTEMI # 3vCAD w/ hx inferior STEMI (2013) s/p PCI with stent RCA + CABG x 3 ([facility]), moderate-severe aortic stenosis (10/2022), HFpEF, HTN Patient had chest pain on arrival and bilateral shoulder ache which resolved, tropes were negative. 5/22 again patient had chest pain and shoulder ache after colonoscopy S/p nitroglycerin x 3 doses given and due to respiratory failure and severe chest pain patient was transferred to stepdown unit Troponin peaked 1176  Cardiology consulted, started Lopressor  25 mg p.o. twice daily and Imdur 30 mg p.o. daily, not a good candidate for anticoagulation heparin  infusion due to high risk for bleeding 5/24 low blood pressure, discontinued Lopressor  and started midodrine 10 mg p.o. 3 times daily.   5/24TTE: LVEF 60 to 65%, grade 1 diastolic function.  Severe  aortic stenosis. D/w cardiology patient is not a good candidate for any intervention for aortic stenosis due to risk of bleeding. Cardiology Recommend correcting anemia up to 9 or 10 hemoglobin because of his cardiovascular symptoms  5/27 cardio increase Imdur from 30 to 60 mg p.o. daily and cleared for discharge.  Signed off 5/28 patient was stable to discharge on Imdur milligram p.o. daily.  Recommended to follow-up with cardiology as an outpatient.  # Hypotension, Resolved  S/p midodrine 10 mg p.o. 3 times daily, discontinued  beta-blocker.   Blood pressure is stable now, patient is tolerating Imdur.  Advised to monitor BP at home and follow with PCP.   # Aspiration pneumonia: Possible aspiration during colonoscopy CXR consistent with pneumonia S/p Azithromycin  x 5 days and switch ceftriaxone  to Zosyn to cover anaerobes. S/p Zosyn completed course for 7 days.  No more need of antibiotics, follow with PCP to repeat chest x-ray after 4 weeks.   # Chronic obstructive pulmonary disease (COPD) # Chronic hypoxic respiratory failure on 2 L O2 at baseline  Resumed albuterol  inhaler as needed.  Patient is not using Trelegy Ellipta inhaler anymore.  Recommend to follow with PCP.   # GI bleeding, recurrent.  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  At that time it was recommended to approach the lesions via lower GI tract to find and cauterize. S/p PPI 40 mg IV BID GI consulted, s/p colonoscopy, no source of bleeding identified.  GI recommended CTA and IR embolization of GI bleeding reoccurs.  5/25 Hb 7.3, keep hemoglobin 8-9 as per cardio, transfuse 1 unit PRBC. 5/28 Hb 9.1 stable.  Continue pantoprazole  40 mg p.o. daily.  Follow-up with GI as an outpatient.    # Acute blood loss anemia due to GI bleeding as above Hb dropped to 6.7, patient received 3 units of PRBC transfusion.  Hb 9.1 today, stable, GI bleeding resolved.  Follow with PCP to repeat CBC after 1 week. # Iron  deficiency, transferrin saturation 13% 5/21 Venofer 300 mg IV one-time dose given followed by oral supplement # Folate level 7.0 at lower end, started folic acid  1 mg p.o. daily # Vitamin D deficiency: started vitamin D 50,000 units p.o. weekly, follow with PCP to repeat vitamin D level after 3 to 6 months. # Depression: s/p Lexapro .  Patient stopped taking Lexapro  at home, recommended to follow-up with PCP, can be resumed if patient needs it. #  Dyslipidemia: continue pravastatin  20 mg p.o. at bedtime  Body mass index is 31.19 kg/m.  Nutrition Interventions:  - Patient was instructed, not to drive, operate heavy machinery, perform activities at heights, swimming or participation in water  activities or provide baby sitting services while on Pain, Sleep and Anxiety Medications; until his outpatient Physician has advised to do so again.  - Also recommended to not to take more than prescribed Pain, Sleep and Anxiety Medications.  Patient was seen by physical therapy, who recommended Home health, which was arranged. On the day of the discharge the patient's vitals were stable, and no other acute medical condition were reported by patient. the patient was felt safe to be discharge at Home with Home health.  Consultants: GI, cardiology Procedures: S/p colonoscopy   Discharge Exam: General: Appear in no distress, no Rash; Oral Mucosa Clear, moist. Cardiovascular: S1 and S2 Present, systolic ejection murmur, Respiratory: normal respiratory effort, Bilateral Air entry present and no Crackles, no wheezes  Abdomen: Bowel Sound present, Soft and no tenderness, no hernia Extremities: no Pedal edema, no calf tenderness Neurology: alert and oriented to time, place, and person affect appropriate.  Filed Weights   05/06/24 1758  Weight: 104.3 kg   Vitals:   05/14/24 0807 05/14/24 1018  BP: (!) 141/87   Pulse: 79   Resp: 18   Temp: (!) 97.5 F (36.4 C)   SpO2: 96% 95%    DISCHARGE MEDICATION: Allergies as of 05/14/2024       Reactions   Other    Welbutrin - has paranoia         Medication List     STOP taking these medications    Aspirin  Low Dose 81 MG tablet Generic drug: aspirin  EC   cetirizine 10 MG tablet Commonly known as: ZYRTEC   cyanocobalamin  1000 MCG tablet Commonly known as: VITAMIN B12   donepezil  10 MG tablet Commonly known as: ARICEPT    escitalopram  5 MG tablet Commonly known as: LEXAPRO     fluticasone  50 MCG/ACT nasal spray Commonly known as: FLONASE    Melatonin 10 MG Caps   naltrexone  50 MG tablet Commonly known as: DEPADE   omeprazole  20 MG capsule Commonly known as: PRILOSEC   polyethylene glycol 17 g packet Commonly known as: MiraLax    sodium phosphate 7-19 GM/118ML Enem   Trelegy Ellipta 100-62.5-25 MCG/ACT Aepb Generic drug: Fluticasone -Umeclidin-Vilant       TAKE these medications    albuterol  108 (90 Base) MCG/ACT inhaler Commonly known as: VENTOLIN  HFA Inhale 2 puffs into the lungs every 6 (six) hours as needed for wheezing or shortness of breath.   ascorbic acid 500 MG tablet Commonly known as: VITAMIN C Take 1 tablet (500 mg total) by mouth daily. Start taking on: May 15, 2024   folic acid  1 MG tablet Commonly known as: FOLVITE  Take 1 tablet (1 mg total) by mouth daily. Start taking on: May 15, 2024   iron  polysaccharides 150 MG capsule Commonly known as: NIFEREX Take 1 capsule (150 mg total) by mouth daily. Start taking on: May 15, 2024   isosorbide mononitrate 60 MG 24 hr tablet Commonly known as: IMDUR Take 1 tablet (60 mg total) by mouth daily. Start taking on: May 15, 2024   pantoprazole  40 MG tablet Commonly known as: PROTONIX  Take 1 tablet (40 mg total) by mouth daily.   pravastatin  20 MG tablet Commonly known as: PRAVACHOL  Take 1 tablet (20 mg total) by mouth at bedtime.   Vitamin D (Ergocalciferol) 1.25 MG (50000 UNIT) Caps capsule Commonly known as: DRISDOL Take 1 capsule (50,000 Units total) by mouth every 7 (seven) days. Start taking on: May 15, 2024               Durable Medical Equipment  (From admission, onward)           Start     Ordered   05/13/24 1515  For home use only DME 3 n 1  Once        05/13/24 1514           Allergies  Allergen Reactions   Other     Welbutrin - has paranoia    Discharge Instructions     Call MD for:   Complete by: As directed    GI bleeding   Call MD  for:  difficulty breathing, headache or visual disturbances   Complete by: As directed    Call MD for:  extreme fatigue   Complete by: As directed  Call MD for:  persistant dizziness or light-headedness   Complete by: As directed    Call MD for:  persistant nausea and vomiting   Complete by: As directed    Call MD for:  severe uncontrolled pain   Complete by: As directed    Call MD for:  temperature >100.4   Complete by: As directed    Diet - low sodium heart healthy   Complete by: As directed    Discharge instructions   Complete by: As directed    F/u with PCP in 1 wk F/u with Cardio in 1-2 weeks for severe AS, may need TAVR F/u GI in  1-2 weeks  Repeat CBC in 1 wk   Increase activity slowly   Complete by: As directed        The results of significant diagnostics from this hospitalization (including imaging, microbiology, ancillary and laboratory) are listed below for reference.    Significant Diagnostic Studies: ECHOCARDIOGRAM COMPLETE Result Date: 05/10/2024    ECHOCARDIOGRAM REPORT   Patient Name:   Antionne Mura Date of Exam: 05/10/2024 Medical Rec #:  [id number]    Height:       72.0 in Accession #:    [id number]   Weight:       230.0 lb Date of Birth:  [birth year]     BSA:          2.261 m Patient Age:    63 years     BP:           103/58 mmHg Patient Gender: M            HR:           92 bpm. Exam Location:  [facility] Procedure: 2D Echo, Cardiac Doppler and Color Doppler (Both Spectral and Color            Flow Doppler were utilized during procedure). Indications:     Chest Pain R07.9  History:         Patient has prior history of Echocardiogram examinations.                  Stroke.  Sonographer:     Kathaleen Pale Roar Referring Phys:  [id number] Creighton Doffing Diagnosing Phys: Antonette Batters MD IMPRESSIONS  1. Left ventricular ejection fraction, by estimation, is 60 to 65%. The left ventricle has normal function. The left ventricle has no regional wall motion abnormalities. The left  ventricular internal cavity size was mildly dilated. Left ventricular diastolic parameters are consistent with Grade I diastolic dysfunction (impaired relaxation).  2. Right ventricular systolic function is normal. The right ventricular size is normal.  3. The mitral valve is normal in structure. No evidence of mitral valve regurgitation.  4. The aortic valve is normal in structure. Aortic valve regurgitation is trivial. Severe aortic valve stenosis. FINDINGS  Left Ventricle: Left ventricular ejection fraction, by estimation, is 60 to 65%. The left ventricle has normal function. The left ventricle has no regional wall motion abnormalities. Strain was performed and the global longitudinal strain is indeterminate. The left ventricular internal cavity size was mildly dilated. There is borderline left ventricular hypertrophy. Left ventricular diastolic parameters are consistent with Grade I diastolic dysfunction (impaired relaxation). Right Ventricle: The right ventricular size is normal. No increase in right ventricular wall thickness. Right ventricular systolic function is normal. Left Atrium: Left atrial size was normal in size. Right Atrium: Right atrial size was normal in size. Pericardium: There is no evidence of pericardial effusion.  Mitral Valve: The mitral valve is normal in structure. No evidence of mitral valve regurgitation. MV peak gradient, 10.1 mmHg. The mean mitral valve gradient is 5.0 mmHg. Tricuspid Valve: The tricuspid valve is normal in structure. Tricuspid valve regurgitation is trivial. Aortic Valve: The aortic valve is normal in structure. Aortic valve regurgitation is trivial. Aortic regurgitation PHT measures 498 msec. Severe aortic stenosis is present. Aortic valve mean gradient measures 39.0 mmHg. Aortic valve peak gradient measures 66.3 mmHg. Aortic valve area, by VTI measures 0.49 cm. Pulmonic Valve: The pulmonic valve was normal in structure. Pulmonic valve regurgitation is not  visualized. Aorta: The ascending aorta was not well visualized. IAS/Shunts: No atrial level shunt detected by color flow Doppler. Additional Comments: 3D was performed not requiring image post processing on an independent workstation and was indeterminate.  LEFT VENTRICLE PLAX 2D LVIDd:         5.30 cm   Diastology LVIDs:         3.50 cm   LV e' medial:    5.87 cm/s LV PW:         1.00 cm   LV E/e' medial:  22.5 LV IVS:        1.30 cm   LV e' lateral:   9.57 cm/s LVOT diam:     1.80 cm   LV E/e' lateral: 13.8 LV SV:         43 LV SV Index:   19 LVOT Area:     2.54 cm  RIGHT VENTRICLE RV Basal diam:  2.70 cm RV Mid diam:    2.40 cm RV S prime:     6.96 cm/s TAPSE (M-mode): 1.5 cm LEFT ATRIUM             Index        RIGHT ATRIUM           Index LA diam:        4.00 cm 1.77 cm/m   RA Area:     15.40 cm LA Vol (A2C):   52.9 ml 23.40 ml/m  RA Volume:   37.60 ml  16.63 ml/m LA Vol (A4C):   60.0 ml 26.54 ml/m LA Biplane Vol: 57.7 ml 25.52 ml/m  AORTIC VALVE                     PULMONIC VALVE AV Area (Vmax):    0.52 cm      PV Vmax:        1.11 m/s AV Area (Vmean):   0.49 cm      PV Peak grad:   4.9 mmHg AV Area (VTI):     0.49 cm      RVOT Peak grad: 3 mmHg AV Vmax:           407.00 cm/s AV Vmean:          295.500 cm/s AV VTI:            0.876 m AV Peak Grad:      66.3 mmHg AV Mean Grad:      39.0 mmHg LVOT Vmax:         83.80 cm/s LVOT Vmean:        57.300 cm/s LVOT VTI:          0.168 m LVOT/AV VTI ratio: 0.19 AI PHT:            498 msec  AORTA Ao Root diam: 2.50 cm Ao Asc diam:  2.60 cm MITRAL VALVE  TRICUSPID VALVE MV Area (PHT): 4.41 cm     TR Peak grad:   19.9 mmHg MV Area VTI:   1.53 cm     TR Vmax:        223.00 cm/s MV Peak grad:  10.1 mmHg MV Mean grad:  5.0 mmHg     SHUNTS MV Vmax:       1.59 m/s     Systemic VTI:  0.17 m MV Vmean:      99.8 cm/s    Systemic Diam: 1.80 cm MV Decel Time: 172 msec MV E velocity: 132.00 cm/s MV A velocity: 145.00 cm/s MV E/A ratio:  0.91 MV A Prime:     11.3 cm/s Antonette Batters MD Electronically signed by Antonette Batters MD Signature Date/Time: 05/10/2024/11:59:32 AM    Final    CT Angio Chest Pulmonary Embolism (PE) W or WO Contrast Result Date: 05/09/2024 CLINICAL DATA:  Sinus tachycardia short of breath, chest pain EXAM: CT ANGIOGRAPHY CHEST WITH CONTRAST TECHNIQUE: Multidetector CT imaging of the chest was performed using the standard protocol during bolus administration of intravenous contrast. Multiplanar CT image reconstructions and MIPs were obtained to evaluate the vascular anatomy. RADIATION DOSE REDUCTION: This exam was performed according to the departmental dose-optimization program which includes automated exposure control, adjustment of the mA and/or kV according to patient size and/or use of iterative reconstruction technique. CONTRAST:  75mL OMNIPAQUE  IOHEXOL  350 MG/ML SOLN COMPARISON:  CT chest 03/13/2023, chest x-ray 05/09/2024 FINDINGS: Cardiovascular: Satisfactory opacification of the pulmonary arteries to the segmental level. No evidence of pulmonary embolism. Advanced aortic atherosclerosis. No aneurysm. Post CABG changes. Coronary vascular calcification. Normal cardiac size. No pericardial effusion. Mild mitral calcification Mediastinum/Nodes: Patent trachea. No thyroid mass. Subcentimeter mediastinal lymph nodes. Right low paratracheal node measuring 10 mm. Esophagus within normal limits. Lungs/Pleura: Emphysema. Trace pleural effusions. Heterogeneous peribronchovascular consolidations in the left lower lobe with smaller foci of irregular airspace disease in the right upper lobe and posterior left upper lobe, suspicious for multifocal infection Upper Abdomen: No acute finding. Borderline to slightly enlarged spleen Musculoskeletal: No acute or suspicious osseous abnormality. Sternotomy Review of the MIP images confirms the above findings. IMPRESSION: 1. Negative for acute pulmonary embolus. 2. Emphysema. Heterogeneous consolidations  in the left lower lobe with smaller foci of irregular airspace disease in the right upper lobe and posterior left upper lobe, suspicious for multifocal infection. Trace pleural effusions. Imaging follow-up to resolution is recommended. 3. Aortic atherosclerosis. Aortic Atherosclerosis (ICD10-I70.0) and Emphysema (ICD10-J43.9). Electronically Signed   By: Esmeralda Hedge M.D.   On: 05/09/2024 19:48   DG Chest Port 1 View Result Date: 05/09/2024 CLINICAL DATA:  Shortness of breath. EXAM: PORTABLE CHEST 1 VIEW COMPARISON:  05/08/2024 FINDINGS: Interval development of patchy left parahilar airspace disease, suspicious for pneumonia lungs are hyperexpanded. Interstitial markings are diffusely coarsened with chronic features. The cardio pericardial silhouette is enlarged. No acute bony abnormality. Telemetry leads overlie the chest. IMPRESSION: 1. Interval development of patchy left parahilar airspace disease, suspicious for pneumonia. 2. Hyperexpansion with chronic interstitial coarsening. Electronically Signed   By: Donnal Fusi M.D.   On: 05/09/2024 05:33   DG Chest Port 1 View Result Date: 05/08/2024 CLINICAL DATA:  Shortness of breath. EXAM: PORTABLE CHEST 1 VIEW COMPARISON:  May 08, 2024 (3:02 p.m.) FINDINGS: Multiple sternal wires are present. The heart size and mediastinal contours are within normal limits. Mild right apical linear scarring and/or atelectasis is seen. Mild left basilar atelectasis and/or infiltrate is also noted.  This represents a new finding when compared to the earlier study. No pleural effusion or pneumothorax is seen. The visualized skeletal structures are unremarkable. IMPRESSION: 1. Evidence of prior median sternotomy. 2. Mild left basilar atelectasis and/or infiltrate. 3. Mild right apical linear scarring and/or atelectasis. Electronically Signed   By: Virgle Grime M.D.   On: 05/08/2024 20:02   DG Chest Port 1 View Result Date: 05/08/2024 CLINICAL DATA:  Shortness of breath.  EXAM: PORTABLE CHEST 1 VIEW COMPARISON:  06/07/2023 FINDINGS: Prior median sternotomy. Stable heart size and mediastinal contours. Chronic hyperinflation and bronchial thickening. No evidence of acute airspace disease. No pneumothorax or significant pleural effusion. Normal pulmonary vasculature. IMPRESSION: Chronic hyperinflation and bronchial thickening. No acute findings. Electronically Signed   By: Chadwick Colonel M.D.   On: 05/08/2024 18:22    Microbiology: Recent Results (from the past 240 hours)  MRSA Next Gen by PCR, Nasal     Status: None   Collection Time: 05/08/24  6:33 PM   Specimen: Nasal Mucosa; Nasal Swab  Result Value Ref Range Status   MRSA by PCR Next Gen NOT DETECTED NOT DETECTED Final    Comment: (NOTE) The GeneXpert MRSA Assay (FDA approved for NASAL specimens only), is one component of a comprehensive MRSA colonization surveillance program. It is not intended to diagnose MRSA infection nor to guide or monitor treatment for MRSA infections. Test performance is not FDA approved in patients less than 42 years old. Performed at [facility], [address] [place], [place] [address]   Resp panel by RT-PCR (RSV, Flu A&B, Covid) Anterior Nasal Swab     Status: None   Collection Time: 05/09/24  1:40 PM   Specimen: Anterior Nasal Swab  Result Value Ref Range Status   SARS Coronavirus 2 by RT PCR NEGATIVE NEGATIVE Final    Comment: (NOTE) SARS-CoV-2 target nucleic acids are NOT DETECTED.  The SARS-CoV-2 RNA is generally detectable in upper respiratory specimens during the acute phase of infection. The lowest concentration of SARS-CoV-2 viral copies this assay can detect is 138 copies/mL. A negative result does not preclude SARS-Cov-2 infection and should not be used as the sole basis for treatment or other patient management decisions. A negative result may occur with  improper specimen collection/handling, submission of specimen other than nasopharyngeal  swab, presence of viral mutation(s) within the areas targeted by this assay, and inadequate number of viral copies(<138 copies/mL). A negative result must be combined with clinical observations, patient history, and epidemiological information. The expected result is Negative.  Fact Sheet for Patients:  BloggerCourse.com  Fact Sheet for Healthcare Providers:  SeriousBroker.it  This test is no t yet approved or cleared by the [place]  FDA and  has been authorized for detection and/or diagnosis of SARS-CoV-2 by FDA under an Emergency Use Authorization (EUA). This EUA will remain  in effect (meaning this test can be used) for the duration of the COVID-19 declaration under Section 564(b)(1) of the Act, 21 U.S.C.section 360bbb-3(b)(1), unless the authorization is terminated  or revoked sooner.       Influenza A by PCR NEGATIVE NEGATIVE Final   Influenza B by PCR NEGATIVE NEGATIVE Final    Comment: (NOTE) The Xpert Xpress SARS-CoV-2/FLU/RSV plus assay is intended as an aid in the diagnosis of influenza from Nasopharyngeal swab specimens and should not be used as a sole basis for treatment. Nasal washings and aspirates are unacceptable for Xpert Xpress SARS-CoV-2/FLU/RSV testing.  Fact Sheet for Patients: BloggerCourse.com  Fact Sheet for  Healthcare Providers: SeriousBroker.it  This test is not yet approved or cleared by the [place]  FDA and has been authorized for detection and/or diagnosis of SARS-CoV-2 by FDA under an Emergency Use Authorization (EUA). This EUA will remain in effect (meaning this test can be used) for the duration of the COVID-19 declaration under Section 564(b)(1) of the Act, 21 U.S.C. section 360bbb-3(b)(1), unless the authorization is terminated or revoked.     Resp Syncytial Virus by PCR NEGATIVE NEGATIVE Final    Comment: (NOTE) Fact Sheet for  Patients: BloggerCourse.com  Fact Sheet for Healthcare Providers: SeriousBroker.it  This test is not yet approved or cleared by the [place]  FDA and has been authorized for detection and/or diagnosis of SARS-CoV-2 by FDA under an Emergency Use Authorization (EUA). This EUA will remain in effect (meaning this test can be used) for the duration of the COVID-19 declaration under Section 564(b)(1) of the Act, 21 U.S.C. section 360bbb-3(b)(1), unless the authorization is terminated or revoked.  Performed at [facility], [address] [place], [place] [address]      Labs: CBC: Recent Labs  Lab 05/10/24 [phone] 05/10/24 1724 05/11/24 0249 05/11/24 1536 05/12/24 0214 05/13/24 0326 05/14/24 0334  WBC 19.0*  --  16.0*  --  16.0* 13.0* 10.8*  HGB 6.9*   < > 7.3* 8.8* 9.5* 9.4* 9.1*  HCT 22.4*   < > 23.1* 27.4* 29.6* 29.4* 29.1*  MCV 91.8  --  90.6  --  89.4 90.7 91.5  PLT 256  --  225  --  285 273 262   < > = values in this interval not displayed.   Basic Metabolic Panel: Recent Labs  Lab 05/10/24 0306 05/11/24 0249 05/12/24 0214 05/13/24 0326 05/14/24 0334  NA 140 137 134* 138 138  K 4.5 3.9 3.8 3.8 3.9  CL 102 101 99 100 99  CO2 31 28 26 28 28   GLUCOSE 125* 129* 128* 179* 113*  BUN 14 14 11 11 11   CREATININE 1.10 1.23 0.91 0.88 0.96  CALCIUM  8.8* 8.3* 8.4* 8.6* 8.7*  MG 2.3 1.8 1.9 2.1 2.0  PHOS 4.5 4.0 3.2 3.3 4.4   Liver Function Tests: Recent Labs  Lab 05/08/24 1916  AST 37  ALT 24  ALKPHOS 61  BILITOT 1.6*  PROT 6.9  ALBUMIN 3.1*   No results for input(s): "LIPASE", "AMYLASE" in the last 168 hours. No results for input(s): "AMMONIA" in the last 168 hours. Cardiac Enzymes: No results for input(s): "CKTOTAL", "CKMB", "CKMBINDEX", "TROPONINI" in the last 168 hours. BNP (last 3 results) Recent Labs    05/31/23 1134 05/08/24 1915  BNP 973.5* 614.9*   CBG: Recent Labs  Lab  05/08/24 1826 05/08/24 1922 05/13/24 0721  GLUCAP 176* 210* 86    Time spent: 35 minutes  Signed:  Althia Atlas  [facility] 05/14/2024 12:04 PM

## 2024-05-14 NOTE — Progress Notes (Signed)
 [facility] CLINIC CARDIOLOGY PROGRESS NOTE       Patient ID: [name] MRN: [id number] DOB/AGE: 1961/01/13 63 y.o.  Admit date: 05/06/2024 Referring Physician Dr. Althia Atlas  Primary Physician Evette Hoes, NP Primary Cardiologist Dr. Parks Bollman (last seen 2024) Reason for Consultation chest pain  HPI: [name] is a 63 y.o. male  with a past medical history of CAD s/p CABG x 3 (04/2012), chronic HFpEF, moderate to severe aortic stenosis, hypertension, hyperlipidemia history of CVA, alcohol  use who presented to the ED on 05/06/2024 for epigastric pain and dark stools.  Patient underwent colonoscopy due to found melena with AVMs on capsule endoscopy.  After colonoscopy patient chest pain that radiates to bilateral shoulders.  Troponin found to be elevated.  Cardiology was consulted for further evaluation.   Patient states he has had constant chest pain for the past month also had a syncopal event at home and never seen a provider regarding the symptoms.  Patient unable to describe his chest pain.  States his chest pain radiates to his throat and shoulders.  Patient endorses dyspnea with mild exertion.  Patient's chest pain-free on 05/23 s/p multiple doses of morphine  and nitroglycerin.   Interval History: -Patient seen and examined this afternoon and laying comfortably in hospital bed. Patient states he feels better this AM and denies any recurrence of chest pain. Patient states SOB is much improved and about at baseline. -Patients BP and HR  stable this AM.  -Patient remains on 2L [place] with stable SpO2.    Review of systems complete and found to be negative unless listed above    Past Medical History:  Diagnosis Date   COPD (chronic obstructive pulmonary disease) (HCC)    CVA (cerebral infarction)    Headache    mild, since stroke 2012   Hypertension    MI (myocardial infarction) (HCC) 05/09/2012   Reading difficulty    pt reports he reads at "about a second grade level"    Stroke [facility]) 2012   numbness to left hand    Wears dentures    full upper and lower    Past Surgical History:  Procedure Laterality Date   CAROTID ENDARTERECTOMY Left    2012 or 2013   CHOLECYSTECTOMY N/A 07/26/2016   Procedure: LAPAROSCOPIC CHOLECYSTECTOMY;  Surgeon: Claudia Cuff, MD;  Location: [facility] ORS;  Service: General;  Laterality: N/A;   COLONOSCOPY N/A 05/08/2024   Procedure: COLONOSCOPY;  Surgeon: Marnee Sink, MD;  Location: [facility] ENDOSCOPY;  Service: Endoscopy;  Laterality: N/A;   COLONOSCOPY WITH PROPOFOL  N/A 02/09/2022   Procedure: COLONOSCOPY WITH PROPOFOL ;  Surgeon: Marnee Sink, MD;  Location: [facility] ENDOSCOPY;  Service: Endoscopy;  Laterality: N/A;   CORONARY ARTERY BYPASS GRAFT  05/10/2012   3 vessel   ENTEROSCOPY N/A 05/16/2023   Procedure: ENTEROSCOPY;  Surgeon: Luke Salaam, MD;  Location: [facility] ENDOSCOPY;  Service: Gastroenterology;  Laterality: N/A;   ENTEROSCOPY N/A 06/01/2023   Procedure: ENTEROSCOPY;  Surgeon: Selena Daily, MD;  Location: [facility] ENDOSCOPY;  Service: Gastroenterology;  Laterality: N/A;   ESOPHAGOGASTRODUODENOSCOPY  05/13/2023   Procedure: ESOPHAGOGASTRODUODENOSCOPY (EGD);  Surgeon: Marnee Sink, MD;  Location: [facility] ENDOSCOPY;  Service: Endoscopy;;   ESOPHAGOGASTRODUODENOSCOPY (EGD) WITH PROPOFOL  N/A 10/28/2022   Procedure: ESOPHAGOGASTRODUODENOSCOPY (EGD) WITH PROPOFOL ;  Surgeon: Quintin Buckle, DO;  Location: [facility] ENDOSCOPY;  Service: Gastroenterology;  Laterality: N/A;   GIVENS CAPSULE STUDY  05/13/2023   Procedure: GIVENS CAPSULE STUDY;  Surgeon: Marnee Sink, MD;  Location: [facility] ENDOSCOPY;  Service: Endoscopy;;  GIVENS CAPSULE STUDY  06/01/2023   Procedure: GIVENS CAPSULE STUDY;  Surgeon: Selena Daily, MD;  Location: [facility] ENDOSCOPY;  Service: Gastroenterology;;    Medications Prior to Admission  Medication Sig Dispense Refill Last Dose/Taking   albuterol  (VENTOLIN  HFA) 108 (90 Base) MCG/ACT inhaler Inhale 2 puffs into the lungs  every 6 (six) hours as needed for wheezing or shortness of breath. (Patient not taking: Reported on 10/16/2023) 8 g 2    aspirin  EC (ASPIRIN  LOW DOSE) 81 MG tablet Take 81 mg by mouth daily. (Patient not taking: Reported on 05/06/2024)   Not Taking   cetirizine (ZYRTEC) 10 MG tablet Take 10 mg by mouth daily. (Patient not taking: Reported on 05/06/2024)   Not Taking   donepezil  (ARICEPT ) 10 MG tablet Take 10 mg by mouth daily.  (Patient not taking: Reported on 05/06/2024)   Not Taking   escitalopram  (LEXAPRO ) 5 MG tablet Take 5 mg by mouth daily. (Patient not taking: Reported on 05/06/2024)   Not Taking   fluticasone  (FLONASE ) 50 MCG/ACT nasal spray Place 2 sprays into both nostrils daily. (Patient not taking: Reported on 05/06/2024)   Not Taking   Melatonin 10 MG CAPS Take 1 capsule by mouth at bedtime. (Patient not taking: Reported on 05/06/2024)   Not Taking   naltrexone  (DEPADE) 50 MG tablet Take 50 mg by mouth daily. (Patient not taking: Reported on 05/06/2024)   Not Taking   omeprazole  (PRILOSEC) 20 MG capsule Take 1 capsule (20 mg total) by mouth 2 (two) times daily before a meal. (Patient not taking: Reported on 05/06/2024) 60 capsule 0 Not Taking   polyethylene glycol (MIRALAX ) 17 g packet Take 17 g by mouth daily. (Patient not taking: Reported on 05/06/2024) 14 each 0 Not Taking   pravastatin  (PRAVACHOL ) 20 MG tablet Take 20 mg by mouth at bedtime.  (Patient not taking: Reported on 05/06/2024)   Not Taking   sodium phosphate (FLEET) 7-19 GM/118ML ENEM Place 133 mLs (1 enema total) rectally once as needed for up to 1 dose for severe constipation. (Patient not taking: Reported on 05/06/2024) 133 mL 0 Not Taking   TRELEGY ELLIPTA 100-62.5-25 MCG/ACT AEPB Inhale 1 puff into the lungs daily. (Patient not taking: Reported on 05/06/2024)   Not Taking   vitamin B-12 (CYANOCOBALAMIN ) 1000 MCG tablet Take 1 tablet (1,000 mcg total) by mouth daily. (Patient not taking: Reported on 05/06/2024) 30 tablet 0 Not Taking    Social History   Socioeconomic History   Marital status: Divorced    Spouse name: Not on file   Number of children: Not on file   Years of education: Not on file   Highest education level: Not on file  Occupational History   Not on file  Tobacco Use   Smoking status: Former    Current packs/day: 0.00    Types: Cigarettes    Quit date: 50    Years since quitting: 32.4   Smokeless tobacco: Current    Types: Snuff   Tobacco comments:    changed to dip  Vaping Use   Vaping status: Never Used  Substance and Sexual Activity   Alcohol  use: Yes    Alcohol /week: 26.0 standard drinks of alcohol     Types: 26 Cans of beer per week   Drug use: No   Sexual activity: Yes    Birth control/protection: None  Other Topics Concern   Not on file  Social History Narrative   Not on file   Social Drivers of Health  Financial Resource Strain: Not on file  Food Insecurity: No Food Insecurity (05/07/2024)   Hunger Vital Sign    Worried About Running Out of Food in the Last Year: Never true    Ran Out of Food in the Last Year: Never true  Transportation Needs: No Transportation Needs (05/07/2024)   PRAPARE - Administrator, Civil Service (Medical): No    Lack of Transportation (Non-Medical): No  Physical Activity: Not on file  Stress: Not on file  Social Connections: Socially Isolated (05/07/2024)   Social Connection and Isolation Panel [NHANES]    Frequency of Communication with Friends and Family: Never    Frequency of Social Gatherings with Friends and Family: Never    Attends Religious Services: Never    Database administrator or Organizations: No    Attends Banker Meetings: Never    Marital Status: Divorced  Catering manager Violence: Not At Risk (05/07/2024)   Humiliation, Afraid, Rape, and Kick questionnaire    Fear of Current or Ex-Partner: No    Emotionally Abused: No    Physically Abused: No    Sexually Abused: No    Family History  Problem  Relation Age of Onset   Pneumonia Mother      Vitals:   05/13/24 2004 05/14/24 0059 05/14/24 0321 05/14/24 0807  BP: 134/72 (!) 152/87 124/69 (!) 141/87  Pulse: 87 85 84 79  Resp: 18 18 16 18   Temp: 97.9 F (36.6 C) 97.7 F (36.5 C) 98.2 F (36.8 C) (!) 97.5 F (36.4 C)  TempSrc:    Oral  SpO2: 96% 100% 98% 96%  Weight:      Height:        PHYSICAL EXAM General: Chronically ill-appearing elderly male, well nourished, in no acute distress. HEENT: Normocephalic and atraumatic. Neck: No JVD.   Lungs: Normal respiratory effort on room air. CTAB Heart: HRRR. Normal S1 and S2 without gallops, + murmur  Abdomen: Non-distended appearing.  Msk: Normal strength and tone for age. Extremities: Warm and well perfused. No clubbing, cyanosis. No edema.  Neuro: Alert and oriented X 3. Psych: Answers questions appropriately.   Labs: Basic Metabolic Panel: Recent Labs    05/13/24 0326 05/14/24 0334  NA 138 138  K 3.8 3.9  CL 100 99  CO2 28 28  GLUCOSE 179* 113*  BUN 11 11  CREATININE 0.88 0.96  CALCIUM  8.6* 8.7*  MG 2.1 2.0  PHOS 3.3 4.4   Liver Function Tests: No results for input(s): "AST", "ALT", "ALKPHOS", "BILITOT", "PROT", "ALBUMIN" in the last 72 hours.  No results for input(s): "LIPASE", "AMYLASE" in the last 72 hours. CBC: Recent Labs    05/13/24 0326 05/14/24 0334  WBC 13.0* 10.8*  HGB 9.4* 9.1*  HCT 29.4* 29.1*  MCV 90.7 91.5  PLT 273 262   Cardiac Enzymes: No results for input(s): "CKTOTAL", "CKMB", "CKMBINDEX", "TROPONINIHS" in the last 72 hours.  BNP: No results for input(s): "BNP" in the last 72 hours.  D-Dimer: No results for input(s): "DDIMER" in the last 72 hours. Hemoglobin A1C: No results for input(s): "HGBA1C" in the last 72 hours. Fasting Lipid Panel: No results for input(s): "CHOL", "HDL", "LDLCALC", "TRIG", "CHOLHDL", "LDLDIRECT" in the last 72 hours. Thyroid Function Tests: No results for input(s): "TSH", "T4TOTAL", "T3FREE",  "THYROIDAB" in the last 72 hours.  Invalid input(s): "FREET3" Anemia Panel: No results for input(s): "VITAMINB12", "FOLATE", "FERRITIN", "TIBC", "IRON ", "RETICCTPCT" in the last 72 hours.    Radiology: ECHOCARDIOGRAM COMPLETE  Result Date: 05/10/2024    ECHOCARDIOGRAM REPORT   Patient Name:   [name] Date of Exam: 05/10/2024 Medical Rec #:  [id number]    Height:       72.0 in Accession #:    [id number]   Weight:       230.0 lb Date of Birth:  [birth year]     BSA:          2.261 m Patient Age:    63 years     BP:           103/58 mmHg Patient Gender: M            HR:           92 bpm. Exam Location:  [facility] Procedure: 2D Echo, Cardiac Doppler and Color Doppler (Both Spectral and Color            Flow Doppler were utilized during procedure). Indications:     Chest Pain R07.9  History:         Patient has prior history of Echocardiogram examinations.                  Stroke.  Sonographer:     Kathaleen Pale Roar Referring Phys:  [id number] Creighton Doffing Diagnosing Phys: Antonette Batters MD IMPRESSIONS  1. Left ventricular ejection fraction, by estimation, is 60 to 65%. The left ventricle has normal function. The left ventricle has no regional wall motion abnormalities. The left ventricular internal cavity size was mildly dilated. Left ventricular diastolic parameters are consistent with Grade I diastolic dysfunction (impaired relaxation).  2. Right ventricular systolic function is normal. The right ventricular size is normal.  3. The mitral valve is normal in structure. No evidence of mitral valve regurgitation.  4. The aortic valve is normal in structure. Aortic valve regurgitation is trivial. Severe aortic valve stenosis. FINDINGS  Left Ventricle: Left ventricular ejection fraction, by estimation, is 60 to 65%. The left ventricle has normal function. The left ventricle has no regional wall motion abnormalities. Strain was performed and the global longitudinal strain is indeterminate. The left ventricular internal  cavity size was mildly dilated. There is borderline left ventricular hypertrophy. Left ventricular diastolic parameters are consistent with Grade I diastolic dysfunction (impaired relaxation). Right Ventricle: The right ventricular size is normal. No increase in right ventricular wall thickness. Right ventricular systolic function is normal. Left Atrium: Left atrial size was normal in size. Right Atrium: Right atrial size was normal in size. Pericardium: There is no evidence of pericardial effusion. Mitral Valve: The mitral valve is normal in structure. No evidence of mitral valve regurgitation. MV peak gradient, 10.1 mmHg. The mean mitral valve gradient is 5.0 mmHg. Tricuspid Valve: The tricuspid valve is normal in structure. Tricuspid valve regurgitation is trivial. Aortic Valve: The aortic valve is normal in structure. Aortic valve regurgitation is trivial. Aortic regurgitation PHT measures 498 msec. Severe aortic stenosis is present. Aortic valve mean gradient measures 39.0 mmHg. Aortic valve peak gradient measures 66.3 mmHg. Aortic valve area, by VTI measures 0.49 cm. Pulmonic Valve: The pulmonic valve was normal in structure. Pulmonic valve regurgitation is not visualized. Aorta: The ascending aorta was not well visualized. IAS/Shunts: No atrial level shunt detected by color flow Doppler. Additional Comments: 3D was performed not requiring image post processing on an independent workstation and was indeterminate.  LEFT VENTRICLE PLAX 2D LVIDd:         5.30 cm   Diastology LVIDs:  3.50 cm   LV e' medial:    5.87 cm/s LV PW:         1.00 cm   LV E/e' medial:  22.5 LV IVS:        1.30 cm   LV e' lateral:   9.57 cm/s LVOT diam:     1.80 cm   LV E/e' lateral: 13.8 LV SV:         43 LV SV Index:   19 LVOT Area:     2.54 cm  RIGHT VENTRICLE RV Basal diam:  2.70 cm RV Mid diam:    2.40 cm RV S prime:     6.96 cm/s TAPSE (M-mode): 1.5 cm LEFT ATRIUM             Index        RIGHT ATRIUM           Index LA  diam:        4.00 cm 1.77 cm/m   RA Area:     15.40 cm LA Vol (A2C):   52.9 ml 23.40 ml/m  RA Volume:   37.60 ml  16.63 ml/m LA Vol (A4C):   60.0 ml 26.54 ml/m LA Biplane Vol: 57.7 ml 25.52 ml/m  AORTIC VALVE                     PULMONIC VALVE AV Area (Vmax):    0.52 cm      PV Vmax:        1.11 m/s AV Area (Vmean):   0.49 cm      PV Peak grad:   4.9 mmHg AV Area (VTI):     0.49 cm      RVOT Peak grad: 3 mmHg AV Vmax:           407.00 cm/s AV Vmean:          295.500 cm/s AV VTI:            0.876 m AV Peak Grad:      66.3 mmHg AV Mean Grad:      39.0 mmHg LVOT Vmax:         83.80 cm/s LVOT Vmean:        57.300 cm/s LVOT VTI:          0.168 m LVOT/AV VTI ratio: 0.19 AI PHT:            498 msec  AORTA Ao Root diam: 2.50 cm Ao Asc diam:  2.60 cm MITRAL VALVE                TRICUSPID VALVE MV Area (PHT): 4.41 cm     TR Peak grad:   19.9 mmHg MV Area VTI:   1.53 cm     TR Vmax:        223.00 cm/s MV Peak grad:  10.1 mmHg MV Mean grad:  5.0 mmHg     SHUNTS MV Vmax:       1.59 m/s     Systemic VTI:  0.17 m MV Vmean:      99.8 cm/s    Systemic Diam: 1.80 cm MV Decel Time: 172 msec MV E velocity: 132.00 cm/s MV A velocity: 145.00 cm/s MV E/A ratio:  0.91 MV A Prime:    11.3 cm/s Dwayne Charlett Conroy MD Electronically signed by Antonette Batters MD Signature Date/Time: 05/10/2024/11:59:32 AM    Final    CT Angio Chest Pulmonary Embolism (PE) W or WO Contrast  Result Date: 05/09/2024 CLINICAL DATA:  Sinus tachycardia short of breath, chest pain EXAM: CT ANGIOGRAPHY CHEST WITH CONTRAST TECHNIQUE: Multidetector CT imaging of the chest was performed using the standard protocol during bolus administration of intravenous contrast. Multiplanar CT image reconstructions and MIPs were obtained to evaluate the vascular anatomy. RADIATION DOSE REDUCTION: This exam was performed according to the departmental dose-optimization program which includes automated exposure control, adjustment of the mA and/or kV according to patient  size and/or use of iterative reconstruction technique. CONTRAST:  75mL OMNIPAQUE  IOHEXOL  350 MG/ML SOLN COMPARISON:  CT chest 03/13/2023, chest x-ray 05/09/2024 FINDINGS: Cardiovascular: Satisfactory opacification of the pulmonary arteries to the segmental level. No evidence of pulmonary embolism. Advanced aortic atherosclerosis. No aneurysm. Post CABG changes. Coronary vascular calcification. Normal cardiac size. No pericardial effusion. Mild mitral calcification Mediastinum/Nodes: Patent trachea. No thyroid mass. Subcentimeter mediastinal lymph nodes. Right low paratracheal node measuring 10 mm. Esophagus within normal limits. Lungs/Pleura: Emphysema. Trace pleural effusions. Heterogeneous peribronchovascular consolidations in the left lower lobe with smaller foci of irregular airspace disease in the right upper lobe and posterior left upper lobe, suspicious for multifocal infection Upper Abdomen: No acute finding. Borderline to slightly enlarged spleen Musculoskeletal: No acute or suspicious osseous abnormality. Sternotomy Review of the MIP images confirms the above findings. IMPRESSION: 1. Negative for acute pulmonary embolus. 2. Emphysema. Heterogeneous consolidations in the left lower lobe with smaller foci of irregular airspace disease in the right upper lobe and posterior left upper lobe, suspicious for multifocal infection. Trace pleural effusions. Imaging follow-up to resolution is recommended. 3. Aortic atherosclerosis. Aortic Atherosclerosis (ICD10-I70.0) and Emphysema (ICD10-J43.9). Electronically Signed   By: Esmeralda Hedge M.D.   On: 05/09/2024 19:48   DG Chest Port 1 View Result Date: 05/09/2024 CLINICAL DATA:  Shortness of breath. EXAM: PORTABLE CHEST 1 VIEW COMPARISON:  05/08/2024 FINDINGS: Interval development of patchy left parahilar airspace disease, suspicious for pneumonia lungs are hyperexpanded. Interstitial markings are diffusely coarsened with chronic features. The cardio pericardial  silhouette is enlarged. No acute bony abnormality. Telemetry leads overlie the chest. IMPRESSION: 1. Interval development of patchy left parahilar airspace disease, suspicious for pneumonia. 2. Hyperexpansion with chronic interstitial coarsening. Electronically Signed   By: Donnal Fusi M.D.   On: 05/09/2024 05:33   DG Chest Port 1 View Result Date: 05/08/2024 CLINICAL DATA:  Shortness of breath. EXAM: PORTABLE CHEST 1 VIEW COMPARISON:  May 08, 2024 (3:02 p.m.) FINDINGS: Multiple sternal wires are present. The heart size and mediastinal contours are within normal limits. Mild right apical linear scarring and/or atelectasis is seen. Mild left basilar atelectasis and/or infiltrate is also noted. This represents a new finding when compared to the earlier study. No pleural effusion or pneumothorax is seen. The visualized skeletal structures are unremarkable. IMPRESSION: 1. Evidence of prior median sternotomy. 2. Mild left basilar atelectasis and/or infiltrate. 3. Mild right apical linear scarring and/or atelectasis. Electronically Signed   By: Virgle Grime M.D.   On: 05/08/2024 20:02   DG Chest Port 1 View Result Date: 05/08/2024 CLINICAL DATA:  Shortness of breath. EXAM: PORTABLE CHEST 1 VIEW COMPARISON:  06/07/2023 FINDINGS: Prior median sternotomy. Stable heart size and mediastinal contours. Chronic hyperinflation and bronchial thickening. No evidence of acute airspace disease. No pneumothorax or significant pleural effusion. Normal pulmonary vasculature. IMPRESSION: Chronic hyperinflation and bronchial thickening. No acute findings. Electronically Signed   By: Chadwick Colonel M.D.   On: 05/08/2024 18:22    ECHO as above  TELEMETRY reviewed by me 05/14/2024: sinus rhythm, rate  80s  EKG reviewed by me: Sinus tachycardia, without significant ischemic changes  Data reviewed by me 05/14/2024: last 24h vitals tele labs imaging I/O ED provider note, admission H&P.  Principal Problem:   GI  bleeding Active Problems:   Dyslipidemia   Depression   Chronic obstructive pulmonary disease (COPD) (HCC)    ASSESSMENT AND PLAN:  [name] is a 63 y.o. male  with a past medical history of CAD s/p CABG x 3 (04/2012), chronic HFpEF, moderate to severe aortic stenosis, hypertension, hyperlipidemia history of CVA, alcohol  use who presented to the ED on 05/06/2024 for epigastric pain and dark stools.  Patient underwent colonoscopy due to found melena with AVMs on capsule endoscopy.  After colonoscopy patient chest pain that radiates to bilateral shoulders.  Troponin found to be elevated.  Cardiology was consulted for further evaluation.   # GI bleed, AVMs # NSTEMI # CAD s/p CABG (2013) # Chronic HFpEF # Moderate to severe aortic stenosis # COPD # History CVA # Pneumonia Patient presents to the ED with epigastric pain and melena. Found to have a GI bleed and underwent colonoscopy today (05/20). Patient received transfusion on 5/21 due to hemoglobin of 6.7, today Hgb 8.1. Trops elevated 200 > 300 > 700 > 600 > 1176 > 1167. EKG without acute ischemic changes. Echo this admission with pEF, no RWMA.  -Patient reports not taking ASA at home. Will defer starting at this time due to low Hgb. -Continue pravastatin  20 mg daily. -Continue Imdur 60 mg daily. -Continue midodrine 5 mg TID. Wean off when BP allow.  -Hold BB for now until BP stabilizes.  -Continue to monitor H&H. Goal Hgb > 9. 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 It was found on his last endoscopy and could not be reached to cauterize".  Anticoagulation would be patient at high risk for bleeding. Will defer heparin  and LHC at this time due to recent GI bleed and high risk for bleeding.  Will schedule outpatient appointment with Dr. Marco Severs to further evaluate Aortic Stenosis.   Cardiology will sign off. Please haiku with questions or re-engage if needed.   This patient's  plan of care was discussed and created with Dr. Custovic and she is in agreement.  Signed: Creighton Doffing, PA-C  05/14/2024, 9:23 AM [facility]

## 2024-05-14 NOTE — Progress Notes (Signed)
 Security [employer] at bedside. He informed the pt that windows were down in his car and it has been raining. The key is stuck in the ignition. Pt verbalized understanding.

## 2024-06-03 ENCOUNTER — Ambulatory Visit
Admission: RE | Admit: 2024-06-03 | Discharge: 2024-06-03 | Disposition: A | Discharge status: Home / Self Care | Source: Ambulatory Visit | Attending: Nurse Practitioner | Admitting: Nurse Practitioner

## 2024-06-03 ENCOUNTER — Other Ambulatory Visit: Payer: Self-pay | Admitting: Nurse Practitioner

## 2024-06-03 ENCOUNTER — Ambulatory Visit
Admission: RE | Admit: 2024-06-03 | Discharge: 2024-06-03 | Disposition: A | Discharge status: Home / Self Care | Attending: Nurse Practitioner | Admitting: Nurse Practitioner

## 2024-06-03 DIAGNOSIS — J690 Pneumonitis due to inhalation of food and vomit: Secondary | ICD-10-CM

## 2024-06-06 ENCOUNTER — Other Ambulatory Visit: Payer: Self-pay

## 2024-06-18 ENCOUNTER — Ambulatory Visit: Admission: RE | Admit: 2024-06-18 | Source: Home / Self Care

## 2024-06-18 ENCOUNTER — Encounter: Admission: RE | Payer: Self-pay | Source: Home / Self Care

## 2024-06-18 DIAGNOSIS — I350 Nonrheumatic aortic (valve) stenosis: Secondary | ICD-10-CM

## 2024-06-18 DIAGNOSIS — R0602 Shortness of breath: Secondary | ICD-10-CM

## 2024-06-18 SURGERY — LEFT HEART CATH AND CORONARY ANGIOGRAPHY
Anesthesia: Moderate Sedation | Laterality: Left

## 2024-06-24 ENCOUNTER — Other Ambulatory Visit (HOSPITAL_COMMUNITY): Payer: Self-pay

## 2024-06-24 ENCOUNTER — Inpatient Hospital Stay
Admission: EM | Admit: 2024-06-24 | Discharge: 2024-06-27 | DRG: 306 | Disposition: A | Discharge status: Home Health | Source: Ambulatory Visit | Attending: Internal Medicine | Admitting: Internal Medicine

## 2024-06-24 ENCOUNTER — Telehealth (HOSPITAL_COMMUNITY): Payer: Self-pay | Admitting: Pharmacy Technician

## 2024-06-24 ENCOUNTER — Emergency Department

## 2024-06-24 ENCOUNTER — Other Ambulatory Visit: Payer: Self-pay

## 2024-06-24 DIAGNOSIS — R079 Chest pain, unspecified: Secondary | ICD-10-CM | POA: Diagnosis present

## 2024-06-24 DIAGNOSIS — J449 Chronic obstructive pulmonary disease, unspecified: Secondary | ICD-10-CM | POA: Diagnosis present

## 2024-06-24 DIAGNOSIS — I5033 Acute on chronic diastolic (congestive) heart failure: Secondary | ICD-10-CM | POA: Diagnosis present

## 2024-06-24 DIAGNOSIS — Z951 Presence of aortocoronary bypass graft: Secondary | ICD-10-CM

## 2024-06-24 DIAGNOSIS — D509 Iron deficiency anemia, unspecified: Secondary | ICD-10-CM | POA: Diagnosis present

## 2024-06-24 DIAGNOSIS — I352 Nonrheumatic aortic (valve) stenosis with insufficiency: Principal | ICD-10-CM | POA: Diagnosis present

## 2024-06-24 DIAGNOSIS — Z6829 Body mass index (BMI) 29.0-29.9, adult: Secondary | ICD-10-CM

## 2024-06-24 DIAGNOSIS — I130 Hypertensive heart and chronic kidney disease with heart failure and stage 1 through stage 4 chronic kidney disease, or unspecified chronic kidney disease: Secondary | ICD-10-CM | POA: Diagnosis present

## 2024-06-24 DIAGNOSIS — E785 Hyperlipidemia, unspecified: Secondary | ICD-10-CM | POA: Diagnosis present

## 2024-06-24 DIAGNOSIS — E662 Morbid (severe) obesity with alveolar hypoventilation: Secondary | ICD-10-CM | POA: Diagnosis present

## 2024-06-24 DIAGNOSIS — I5031 Acute diastolic (congestive) heart failure: Secondary | ICD-10-CM | POA: Diagnosis present

## 2024-06-24 DIAGNOSIS — Z888 Allergy status to other drugs, medicaments and biological substances status: Secondary | ICD-10-CM

## 2024-06-24 DIAGNOSIS — N1831 Chronic kidney disease, stage 3a: Secondary | ICD-10-CM | POA: Diagnosis present

## 2024-06-24 DIAGNOSIS — N179 Acute kidney failure, unspecified: Secondary | ICD-10-CM | POA: Diagnosis present

## 2024-06-24 DIAGNOSIS — I2510 Atherosclerotic heart disease of native coronary artery without angina pectoris: Secondary | ICD-10-CM | POA: Diagnosis present

## 2024-06-24 DIAGNOSIS — Z79899 Other long term (current) drug therapy: Secondary | ICD-10-CM

## 2024-06-24 DIAGNOSIS — R0609 Other forms of dyspnea: Principal | ICD-10-CM

## 2024-06-24 DIAGNOSIS — I10 Essential (primary) hypertension: Secondary | ICD-10-CM | POA: Diagnosis present

## 2024-06-24 DIAGNOSIS — E538 Deficiency of other specified B group vitamins: Secondary | ICD-10-CM | POA: Diagnosis present

## 2024-06-24 DIAGNOSIS — I252 Old myocardial infarction: Secondary | ICD-10-CM

## 2024-06-24 DIAGNOSIS — F32A Depression, unspecified: Secondary | ICD-10-CM | POA: Diagnosis present

## 2024-06-24 DIAGNOSIS — F1010 Alcohol abuse, uncomplicated: Secondary | ICD-10-CM | POA: Diagnosis present

## 2024-06-24 DIAGNOSIS — I6523 Occlusion and stenosis of bilateral carotid arteries: Secondary | ICD-10-CM | POA: Diagnosis present

## 2024-06-24 DIAGNOSIS — Z604 Social exclusion and rejection: Secondary | ICD-10-CM | POA: Diagnosis present

## 2024-06-24 DIAGNOSIS — I350 Nonrheumatic aortic (valve) stenosis: Secondary | ICD-10-CM

## 2024-06-24 DIAGNOSIS — T502X5A Adverse effect of carbonic-anhydrase inhibitors, benzothiadiazides and other diuretics, initial encounter: Secondary | ICD-10-CM | POA: Diagnosis present

## 2024-06-24 DIAGNOSIS — K31819 Angiodysplasia of stomach and duodenum without bleeding: Secondary | ICD-10-CM | POA: Diagnosis present

## 2024-06-24 DIAGNOSIS — Z720 Tobacco use: Secondary | ICD-10-CM

## 2024-06-24 DIAGNOSIS — Z1152 Encounter for screening for COVID-19: Secondary | ICD-10-CM

## 2024-06-24 DIAGNOSIS — Z8673 Personal history of transient ischemic attack (TIA), and cerebral infarction without residual deficits: Secondary | ICD-10-CM

## 2024-06-24 LAB — CBC WITH DIFFERENTIAL/PLATELET
Abs Immature Granulocytes: 0.02 K/uL (ref 0.00–0.07)
Basophils Absolute: 0.1 K/uL (ref 0.0–0.1)
Basophils Relative: 2 %
Eosinophils Absolute: 0.3 K/uL (ref 0.0–0.5)
Eosinophils Relative: 4 %
HCT: 34.6 % — ABNORMAL LOW (ref 39.0–52.0)
Hemoglobin: 10.3 g/dL — ABNORMAL LOW (ref 13.0–17.0)
Immature Granulocytes: 0 %
Lymphocytes Relative: 22 %
Lymphs Abs: 1.9 K/uL (ref 0.7–4.0)
MCH: 26.8 pg (ref 26.0–34.0)
MCHC: 29.8 g/dL — ABNORMAL LOW (ref 30.0–36.0)
MCV: 90.1 fL (ref 80.0–100.0)
Monocytes Absolute: 1.1 K/uL — ABNORMAL HIGH (ref 0.1–1.0)
Monocytes Relative: 13 %
Neutro Abs: 4.9 K/uL (ref 1.7–7.7)
Neutrophils Relative %: 59 %
Platelets: 224 K/uL (ref 150–400)
RBC: 3.84 MIL/uL — ABNORMAL LOW (ref 4.22–5.81)
RDW: 15.9 % — ABNORMAL HIGH (ref 11.5–15.5)
WBC: 8.4 K/uL (ref 4.0–10.5)
nRBC: 0 % (ref 0.0–0.2)

## 2024-06-24 LAB — COMPREHENSIVE METABOLIC PANEL WITH GFR
ALT: 17 U/L (ref 0–44)
AST: 26 U/L (ref 15–41)
Albumin: 3.8 g/dL (ref 3.5–5.0)
Alkaline Phosphatase: 50 U/L (ref 38–126)
Anion gap: 12 (ref 5–15)
BUN: 15 mg/dL (ref 8–23)
CO2: 28 mmol/L (ref 22–32)
Calcium: 9.6 mg/dL (ref 8.9–10.3)
Chloride: 98 mmol/L (ref 98–111)
Creatinine, Ser: 0.85 mg/dL (ref 0.61–1.24)
GFR, Estimated: >60 mL/min (ref >60)
Glucose, Bld: 104 mg/dL — ABNORMAL HIGH (ref 70–99)
Potassium: 3.9 mmol/L (ref 3.5–5.1)
Sodium: 138 mmol/L (ref 135–145)
Total Bilirubin: 1.6 mg/dL — ABNORMAL HIGH (ref 0.0–1.2)
Total Protein: 8 g/dL (ref 6.5–8.1)

## 2024-06-24 LAB — LIPID PANEL
Cholesterol: 157 mg/dL (ref 0–200)
HDL: 50 mg/dL (ref >40)
LDL Cholesterol: 98 mg/dL (ref 0–99)
Total CHOL/HDL Ratio: 3.1 ratio
Triglycerides: 43 mg/dL (ref <150)
VLDL: 9 mg/dL (ref 0–40)

## 2024-06-24 LAB — TROPONIN I (HIGH SENSITIVITY)
Troponin I (High Sensitivity): 17 ng/L (ref <18)
Troponin I (High Sensitivity): 18 ng/L — ABNORMAL HIGH (ref <18)

## 2024-06-24 LAB — BRAIN NATRIURETIC PEPTIDE: B Natriuretic Peptide: 446.7 pg/mL — ABNORMAL HIGH (ref 0.0–100.0)

## 2024-06-24 MED ORDER — FUROSEMIDE 10 MG/ML IJ SOLN
20.0000 mg | Freq: Once | INTRAMUSCULAR | Status: AC
  Administered 2024-06-24: 20 mg via INTRAVENOUS
  Filled 2024-06-24: qty 4

## 2024-06-24 MED ORDER — ACETAMINOPHEN 325 MG PO TABS
650.0000 mg | ORAL_TABLET | Freq: Four times a day (QID) | ORAL | Status: DC | PRN
  Administered 2024-06-24: 650 mg via ORAL
  Filled 2024-06-24: qty 2

## 2024-06-24 MED ORDER — ACETAMINOPHEN 650 MG RE SUPP
650.0000 mg | Freq: Four times a day (QID) | RECTAL | Status: DC | PRN
Start: 2024-06-24 — End: 2024-06-27

## 2024-06-24 MED ORDER — ONDANSETRON HCL 4 MG/2ML IJ SOLN: 4.0000 mg | Freq: Four times a day (QID) | INTRAMUSCULAR | Status: DC | PRN

## 2024-06-24 MED ORDER — ONDANSETRON HCL 4 MG PO TABS: 4.0000 mg | ORAL_TABLET | Freq: Four times a day (QID) | ORAL | Status: DC | PRN

## 2024-06-24 MED ORDER — FUROSEMIDE 10 MG/ML IJ SOLN
40.0000 mg | Freq: Every day | INTRAMUSCULAR | Status: DC
  Filled 2024-06-24: qty 4

## 2024-06-24 MED ORDER — FUROSEMIDE 10 MG/ML IJ SOLN: 20.0000 mg | Freq: Every day | INTRAMUSCULAR | Status: DC

## 2024-06-24 MED ORDER — ENOXAPARIN SODIUM 40 MG/0.4ML IJ SOSY
40.0000 mg | PREFILLED_SYRINGE | INTRAMUSCULAR | Status: DC
  Administered 2024-06-24 – 2024-06-26 (×3): 40 mg via SUBCUTANEOUS
  Filled 2024-06-24 (×3): qty 0.4

## 2024-06-24 MED ORDER — ISOSORBIDE MONONITRATE ER 60 MG PO TB24
60.0000 mg | ORAL_TABLET | Freq: Every day | ORAL | Status: DC
  Administered 2024-06-24 – 2024-06-27 (×4): 60 mg via ORAL
  Filled 2024-06-24 (×4): qty 1

## 2024-06-24 MED ORDER — PANTOPRAZOLE SODIUM 40 MG PO TBEC
40.0000 mg | DELAYED_RELEASE_TABLET | Freq: Every day | ORAL | Status: DC
  Administered 2024-06-24 – 2024-06-27 (×4): 40 mg via ORAL
  Filled 2024-06-24 (×4): qty 1

## 2024-06-24 MED ORDER — DAPAGLIFLOZIN PROPANEDIOL 10 MG PO TABS
10.0000 mg | ORAL_TABLET | Freq: Every day | ORAL | Status: DC
  Administered 2024-06-24 – 2024-06-27 (×4): 10 mg via ORAL
  Filled 2024-06-24 (×4): qty 1

## 2024-06-24 NOTE — ED Provider Notes (Signed)
 [facility] Provider Note    Event Date/Time   First MD Initiated Contact with Patient 06/24/24 1304     (approximate)   History   Shortness of Breath   HPI  [name] is a 63 y.o. male who presents to the ED for evaluation of Shortness of Breath   Review of cardiology clinic visit from last month.  History of CABG times 03/06/2012, diastolic CHF, aortic stenosis, COPD, CKD, ethanol abuse Was prescribed small dose of Lasix  for volume overload/CHF and they had scheduled a an outpatient left heart cath but this was canceled as he apparently did not show up.  Patient reports he got his Lasix  prescriptions and has only been taking what he has had since recent admission at our facility  He presents with progressively worsening chronic dyspnea on exertion.   Physical Exam   Triage Vital Signs: ED Triage Vitals  Encounter Vitals Group     BP 06/24/24 1105 139/79     Girls Systolic BP Percentile --      Girls Diastolic BP Percentile --      Boys Systolic BP Percentile --      Boys Diastolic BP Percentile --      Pulse Rate 06/24/24 1105 93     Resp 06/24/24 1105 20     Temp 06/24/24 1105 98.3 F (36.8 C)     Temp Source 06/24/24 1105 Oral     SpO2 06/24/24 1105 92 %     Weight 06/24/24 1106 215 lb (97.5 kg)     Height 06/24/24 1106 6' (1.829 m)     Head Circumference --      Peak Flow --      Pain Score 06/24/24 1105 0     Pain Loc --      Pain Education --      Exclude from Growth Chart --     Most recent vital signs: Vitals:   06/24/24 1245 06/24/24 1400  BP: (!) 163/89 (!) 138/117  Pulse: 93 97  Resp: 17 13  Temp:    SpO2: 95% 96%    General: Awake, no distress.  CV:  Good peripheral perfusion.  Resp:  Normal effort.  Abd:  No distention.  MSK:  No deformity noted.  Pitting edema to bilateral lower extremities Neuro:  No focal deficits appreciated. Other:     ED Results / Procedures / Treatments   Labs (all labs ordered are  listed, but only abnormal results are displayed) Labs Reviewed  CBC WITH DIFFERENTIAL/PLATELET - Abnormal; Notable for the following components:      Result Value   RBC 3.84 (*)    Hemoglobin 10.3 (*)    HCT 34.6 (*)    MCHC 29.8 (*)    RDW 15.9 (*)    Monocytes Absolute 1.1 (*)    All other components within normal limits  COMPREHENSIVE METABOLIC PANEL WITH GFR - Abnormal; Notable for the following components:   Glucose, Bld 104 (*)    Total Bilirubin 1.6 (*)    All other components within normal limits  BRAIN NATRIURETIC PEPTIDE - Abnormal; Notable for the following components:   B Natriuretic Peptide 446.7 (*)    All other components within normal limits  TROPONIN I (HIGH SENSITIVITY) - Abnormal; Notable for the following components:   Troponin I (High Sensitivity) 18 (*)    All other components within normal limits  TROPONIN I (HIGH SENSITIVITY)    EKG Sinus rhythm with a rate  of 91 bpm.  Normal axis and intervals without clear signs of acute ischemia.  Nonspecific ST changes inferiorly  RADIOLOGY CXR interpreted by me without evidence of acute cardiopulmonary pathology.  Official radiology report(s): DG Chest 2 View Result Date: 06/24/2024 CLINICAL DATA:  sob EXAM: CHEST - 2 VIEW COMPARISON:  June 03, 2024 FINDINGS: Biapical pleural thickening. No focal airspace consolidation, pleural effusion, or pneumothorax. No cardiomegaly. Sternotomy wires and CABG markers. Tortuous aorta with aortic atherosclerosis. No acute fracture or destructive lesions. Multilevel thoracic osteophytosis. Osteopenia. IMPRESSION: No acute cardiopulmonary abnormality. Electronically Signed   By: Rogelia Myers M.D.   On: 06/24/2024 11:54    PROCEDURES and INTERVENTIONS:  .1-3 Lead EKG Interpretation  Performed by: Claudene Rover, MD Authorized by: Claudene Rover, MD     Interpretation: normal     ECG rate:  90   ECG rate assessment: normal     Rhythm: sinus rhythm     Ectopy: none     Conduction:  normal     Medications  furosemide  (LASIX ) injection 20 mg (20 mg Intravenous Given 06/24/24 1416)     IMPRESSION / MDM / ASSESSMENT AND PLAN / ED COURSE  I reviewed the triage vital signs and the nursing notes.  Differential diagnosis includes, but is not limited to, ACS, PTX, PNA, muscle strain/spasm, PE, dissection, anxiety, pleural effusion  {Patient presents with symptoms of an acute illness or injury that is potentially life-threatening.  Patient presents with progressive chronic dyspnea on exertion.  His history of both CHF and aortic stenosis.  This is a recent outpatient left heart cath.  Sent over from the cardiology clinic for admission.  I think this would be reasonable to admit to hopefully obtain left heart cath and to evaluate for possible TAVR.  He does appear volume overloaded with elevated BNP and never picked up an outpatient prescription for Lasix  so I have ordered a small dose of IV Lasix  but we will be conservative considering his aortic stenosis.  I consult medicine for admission      FINAL CLINICAL IMPRESSION(S) / ED DIAGNOSES   Final diagnoses:  Dyspnea on exertion     Rx / DC Orders   ED Discharge Orders     None        Note:  This document was prepared using Dragon voice recognition software and may include unintentional dictation errors.   Claudene Rover, MD 06/24/24 [phone]

## 2024-06-24 NOTE — H&P (Signed)
 History and Physical  Patient: [name] FMW:[id number] DOB: April 08, 1961 DOA: 06/24/2024 DOS: the patient was seen and examined on 06/24/2024 Patient coming from: Home  Chief Complaint: First Nurse Note: Patient to ED from [facility] cardiology for CP, SOB and leg swelling.   Pt c/o SOB that has worsened over 6 months with BLE swelling. Pt reports he had CP last night while going to the bathroom. Pt reports CP resolved after resting for a while. [facility] sent patient over for possible admission.  HPI: Patient with PMH of CAD SP CABG, chronic HFpEF, severe aortic stenosis, HTN, HLD, obesity, COPD, recent GI bleed, alcohol  use disorder presented to the hospital with complaints of chest tightness, shortness of breath. Patient has known history of aortic stenosis and sees cardiology for that.  Was recently seen in the clinic as he started having progressively worsening shortness of breath and intermittent chest tightness.  Was recommended to undergo left heart catheterization for possible evaluation of severity of stenosis for possibility of a TAVR. As symptoms progressively worsen, he came to clinic for further evaluation and from there he was brought to the ED for admission for volume overload and possible ischemic evaluation. He reports the symptoms have been ongoing for last 2 weeks.  After receiving Lasix  he reports some improvement.  At the time of my evaluation he reports 3 out of 10 chest tightness which is significantly better than his presentation. Also reports shortness of breath primarily occurring on exertion but currently also occurring at rest. Reports that he wakes up with shortness of breath in the middle of the night as well. He reports compliant with his medication. Drink couple of beers every other day. Denies any smoking right now.  Assessment and Plan: Moderate to severe aortic stenosis. Appreciate cardiology consultation. Plan was for TAVR outpatient. Will follow recommendation from  cardiology about further workup.  Acute on chronic HFpEF. Currently receiving IV Lasix . Appreciate cardiology consultation. Monitor.  Chest tightness. CAD SP CABG. HTN. HLD. Troponins are not consistent with any ischemia.  EKG negative for any acute ischemia as well. Patient poor historian therefore unable to tell me when his chest tightness have started but symptoms less likely cardiac in nature given negative troponins. Cardiology consulted. Plan for left heart cath tomorrow. Continue Imdur . Continue aspirin . Blood pressure has been soft and therefore will be allowing higher blood pressure to work with diuresis.  Recurrent GI bleed.  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 Monitor for now.  Depression. Continue home regimen.  HLD. Continue statin.  Obesity. Body mass index is 29.16 kg/m.  Placing the patient at high risk of poor outcome.  Alcohol  use. Denies any history of abuse.  Reports that he drinks 2 beers every other day.  For now monitor.  Does not have CKD 3A.   Advance Care Planning:   Code Status: Full Code  Consults: Cardiology  Prior to Admission medications   Medication Sig Start Date End Date Taking? Authorizing Provider  albuterol  (VENTOLIN  HFA) 108 (90 Base) MCG/ACT inhaler Inhale 2 puffs into the lungs every 6 (six) hours as needed for wheezing or shortness of breath. 05/14/24  Yes Von Bellis, MD  ascorbic acid  (VITAMIN C ) 500 MG tablet Take 1 tablet (500 mg total) by mouth daily. 05/15/24 08/13/24 Yes Von Bellis, MD  folic acid  (FOLVITE ) 1 MG tablet Take 1 tablet (1 mg total) by mouth daily. 05/15/24 08/13/24 Yes Von Bellis, MD  iron  polysaccharides (NIFEREX) 150 MG capsule Take 1 capsule (150  mg total) by mouth daily. 05/15/24 08/13/24 Yes Von Bellis, MD  isosorbide  mononitrate (IMDUR ) 60 MG 24 hr tablet Take 1 tablet (60 mg total) by mouth daily. 05/15/24 05/15/25 Yes Von Bellis, MD  pravastatin  (PRAVACHOL ) 20 MG tablet  Take 1 tablet (20 mg total) by mouth at bedtime. 05/14/24 05/14/25 Yes Von Bellis, MD  Vitamin D , Ergocalciferol , (DRISDOL ) 1.25 MG (50000 UNIT) CAPS capsule Take 1 capsule (50,000 Units total) by mouth every 7 (seven) days. 05/15/24 08/13/24 Yes Von Bellis, MD  pantoprazole  (PROTONIX ) 40 MG tablet Take 1 tablet (40 mg total) by mouth daily. 05/14/24 08/12/24  Von Bellis, MD    Past Medical History:  Diagnosis Date   COPD (chronic obstructive pulmonary disease) (HCC)    CVA (cerebral infarction)    Headache    mild, since stroke 2012   Hypertension    MI (myocardial infarction) (HCC) 05/09/2012   Reading difficulty    pt reports he reads at about a second grade level   Stroke [facility]) 2012   numbness to left hand    Wears dentures    full upper and lower   Past Surgical History:  Procedure Laterality Date   CAROTID ENDARTERECTOMY Left    2012 or 2013   CHOLECYSTECTOMY N/A 07/26/2016   Procedure: LAPAROSCOPIC CHOLECYSTECTOMY;  Surgeon: Charlie FORBES Fell, MD;  Location: [facility] ORS;  Service: General;  Laterality: N/A;   COLONOSCOPY N/A 05/08/2024   Procedure: COLONOSCOPY;  Surgeon: Jinny Carmine, MD;  Location: [facility] ENDOSCOPY;  Service: Endoscopy;  Laterality: N/A;   COLONOSCOPY WITH PROPOFOL  N/A 02/09/2022   Procedure: COLONOSCOPY WITH PROPOFOL ;  Surgeon: Jinny Carmine, MD;  Location: [facility] ENDOSCOPY;  Service: Endoscopy;  Laterality: N/A;   CORONARY ARTERY BYPASS GRAFT  05/10/2012   3 vessel   ENTEROSCOPY N/A 05/16/2023   Procedure: ENTEROSCOPY;  Surgeon: Therisa Bi, MD;  Location: [facility] ENDOSCOPY;  Service: Gastroenterology;  Laterality: N/A;   ENTEROSCOPY N/A 06/01/2023   Procedure: ENTEROSCOPY;  Surgeon: Unk Corinn Skiff, MD;  Location: [facility] ENDOSCOPY;  Service: Gastroenterology;  Laterality: N/A;   ESOPHAGOGASTRODUODENOSCOPY  05/13/2023   Procedure: ESOPHAGOGASTRODUODENOSCOPY (EGD);  Surgeon: Jinny Carmine, MD;  Location: [facility] ENDOSCOPY;  Service: Endoscopy;;    ESOPHAGOGASTRODUODENOSCOPY (EGD) WITH PROPOFOL  N/A 10/28/2022   Procedure: ESOPHAGOGASTRODUODENOSCOPY (EGD) WITH PROPOFOL ;  Surgeon: Onita Elspeth Sharper, DO;  Location: [facility] ENDOSCOPY;  Service: Gastroenterology;  Laterality: N/A;   GIVENS CAPSULE STUDY  05/13/2023   Procedure: GIVENS CAPSULE STUDY;  Surgeon: Jinny Carmine, MD;  Location: [facility] ENDOSCOPY;  Service: Endoscopy;;   GIVENS CAPSULE STUDY  06/01/2023   Procedure: GIVENS CAPSULE STUDY;  Surgeon: Unk Corinn Skiff, MD;  Location: [facility] ENDOSCOPY;  Service: Gastroenterology;;   Social History:  reports that he quit smoking about 32 years ago. His smoking use included cigarettes. His smokeless tobacco use includes snuff. He reports current alcohol  use of about 26.0 standard drinks of alcohol  per week. He reports that he does not use drugs. Allergies  Allergen Reactions   Wellbutrin  [Bupropion ]     Paranoia    Family History  Problem Relation Age of Onset   Pneumonia Mother    Physical Exam: Vitals:   06/24/24 1330 06/24/24 1400 06/24/24 1536 06/24/24 1800  BP: (!) 116/90 (!) 138/117  129/66  Pulse: 96 97  (!) 109  Resp: 19 13  (!) 22  Temp:   97.9 F (36.6 C)   TempSrc:   Oral   SpO2: 99% 96%  93%  Weight:      Height:  General: Appear in moderate distress; no visible Abnormal Neck Mass Or lumps, Conjunctiva normal Cardiovascular: S1 and S2 Present, aortic systolic  Murmur, Respiratory: good respiratory effort, Bilateral Air entry present and faint basal crackles, no wheezes Abdomen: Bowel Sound present, Non tender  Extremities: trace Pedal edema Neurology: alert and oriented to time, place, and person Gait not checked due to patient safety concerns   Data Reviewed: I have reviewed ED notes, Vitals, Lab results and outpatient records. Since last encounter, pertinent lab results CBC and BMP   . I have ordered test including CBC and BMP  . I have discussed pt's care plan and test results with cardiology and ED  provider  .   Family Communication: No one at bedside  Author: Yetta Blanch, MD 06/24/2024 6:41 PM For on call review [email or web address].

## 2024-06-24 NOTE — Telephone Encounter (Signed)
 Patient Product/process development scientist completed.    The patient is insured through [facility]. Patient has Medicare and is not eligible for a copay card, but may be able to apply for patient assistance or Medicare RX Payment Plan (Patient Must reach out to their plan, if eligible for payment plan), if available.    Ran test claim for Farxiga  10 mg and the current 30 day co-pay is $0.00.   This test claim was processed through [facility]- copay amounts may vary at other pharmacies due to pharmacy/plan contracts, or as the patient moves through the different stages of their insurance plan.     Reyes Sharps, CPHT Pharmacy Technician III Certified Patient Advocate [facility] Patient Advocate Team Direct Number: [phone]  Fax: [phone]

## 2024-06-24 NOTE — Consult Note (Signed)
 [facility] CARDIOLOGY CONSULT NOTE       Patient ID: [name] MRN: [id number] DOB/AGE: Mar 30, 1961 63 y.o.  Admit date: 06/24/2024 Referring Physician Dr. Yetta Blanch Primary Physician Osa Geralds, NP Primary Cardiologist Tinnie Maiden, NP Reason for Consultation Aortic Stenosis  HPI: [name] is a 63 y.o. 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 Patient has known severe aortic stenosis. Patient had scheduled LHC to further evaluate coronary arteries and aortic stenosis on 07/02 however patient no showed for scheduled procedure. Cardiology was consulted for further evaluation aortic stenosis.   Patient presented to the ED with shortness of breath, orthopnea, lower extremity swelling. Work up in the ED notable for Na 138, K 3.9, Cr 0.85, Hgb 10.3, plts 224, WBC 8.4. LFTs within normal limits. BNP elevated at 440. CXR with no acute cardiopulmonary disease. Trops negative x2. EKG with sinus rhythm, rate 91 bpm with non specific ST-T wave changes. Patient received 1x IV lasix  20 mg and started on home Imdur .   At the time of my evaluation this afternoon, patient was resting comfortably in ED stretcher laying at incline. We discussed patient symptoms in further detail.  Patient was recently seen by outpatient cardiology and was sent to ED due to heart failure exacerbation symptoms and needing evaluation for aortic stenosis. Patient states he has been having shortness of breath for the past few months but has been especially worse lately associated with worsening lower extremity swelling and orthopnea.  Patient also endorses  intermittent chest tightness that worsens with exertion and deep breathing.  States he is mostly concerned with his shortness of breath.  Patient states that he is only able to walk a few steps due to this.  When discussing medications,  patient is unsure what medication he takes. Patient reports good UOP and clear urine s/p IV lasix  20 mg x1.   Pertinent Cardiac History (Most recent) -Echo 05/10/2024 1. Left ventricular ejection fraction, by estimation, is 60 to 65%. The left ventricle has normal function. The left ventricle has no regional wall motion abnormalities. The left ventricular internal cavity size was mildly dilated. Left ventricular diastolic parameters are consistent with Grade I diastolic dysfunction (impaired relaxation).   2. Right ventricular systolic function is normal. The right ventricular size is normal.   3. The mitral valve is normal in structure. No evidence of mitral valve regurgitation.   4. The aortic valve is normal in structure. Aortic valve regurgitation is trivial. Severe aortic valve stenosis.   The aortic valve is normal in structure. Aortic valve regurgitation is trivial. Aortic regurgitation PHT measures 498 msec.  Severe aortic stenosis is present. Aortic valve mean gradient measures 39.0 mmHg. Aortic valve peak gradient measures 66.3 mmHg. Aortic valve area, by VTI measures 0.49 cm.    Review of systems complete and found to be negative unless listed above    Past Medical History:  Diagnosis Date   COPD (chronic obstructive pulmonary disease) (HCC)    CVA (cerebral infarction)    Headache    mild, since stroke 2012   Hypertension    MI (myocardial infarction) (HCC) 05/09/2012   Reading difficulty    pt  reports he reads at about a second grade level   Stroke [facility]) 2012   numbness to left hand    Wears dentures    full upper and lower    Past Surgical History:  Procedure Laterality Date   CAROTID ENDARTERECTOMY Left    2012 or 2013    CHOLECYSTECTOMY N/A 07/26/2016   Procedure: LAPAROSCOPIC CHOLECYSTECTOMY;  Surgeon: Charlie FORBES Fell, MD;  Location: [facility] ORS;  Service: General;  Laterality: N/A;   COLONOSCOPY N/A 05/08/2024   Procedure: COLONOSCOPY;  Surgeon: Jinny Carmine, MD;  Location: [facility] ENDOSCOPY;  Service: Endoscopy;  Laterality: N/A;   COLONOSCOPY WITH PROPOFOL  N/A 02/09/2022   Procedure: COLONOSCOPY WITH PROPOFOL ;  Surgeon: Jinny Carmine, MD;  Location: [facility] ENDOSCOPY;  Service: Endoscopy;  Laterality: N/A;   CORONARY ARTERY BYPASS GRAFT  05/10/2012   3 vessel   ENTEROSCOPY N/A 05/16/2023   Procedure: ENTEROSCOPY;  Surgeon: Therisa Bi, MD;  Location: [facility] ENDOSCOPY;  Service: Gastroenterology;  Laterality: N/A;   ENTEROSCOPY N/A 06/01/2023   Procedure: ENTEROSCOPY;  Surgeon: Unk Corinn Skiff, MD;  Location: [facility] ENDOSCOPY;  Service: Gastroenterology;  Laterality: N/A;   ESOPHAGOGASTRODUODENOSCOPY  05/13/2023   Procedure: ESOPHAGOGASTRODUODENOSCOPY (EGD);  Surgeon: Jinny Carmine, MD;  Location: [facility] ENDOSCOPY;  Service: Endoscopy;;   ESOPHAGOGASTRODUODENOSCOPY (EGD) WITH PROPOFOL  N/A 10/28/2022   Procedure: ESOPHAGOGASTRODUODENOSCOPY (EGD) WITH PROPOFOL ;  Surgeon: Onita Elspeth Sharper, DO;  Location: [facility] ENDOSCOPY;  Service: Gastroenterology;  Laterality: N/A;   GIVENS CAPSULE STUDY  05/13/2023   Procedure: GIVENS CAPSULE STUDY;  Surgeon: Jinny Carmine, MD;  Location: [facility] ENDOSCOPY;  Service: Endoscopy;;   GIVENS CAPSULE STUDY  06/01/2023   Procedure: GIVENS CAPSULE STUDY;  Surgeon: Unk Corinn Skiff, MD;  Location: [facility] ENDOSCOPY;  Service: Gastroenterology;;    (Not in a hospital admission)  Social History   Socioeconomic History   Marital status: Divorced    Spouse name: Not on file   Number of children: Not on file   Years of education: Not on file   Highest education level: Not on file  Occupational History   Not on file  Tobacco Use   Smoking status: Former    Current packs/day: 0.00    Types:  Cigarettes    Quit date: 45    Years since quitting: 32.5   Smokeless tobacco: Current    Types: Snuff   Tobacco comments:    changed to dip  Vaping Use   Vaping status: Never Used  Substance and Sexual Activity   Alcohol  use: Yes    Alcohol /week: 26.0 standard drinks of alcohol     Types: 26 Cans of beer per week   Drug use: No   Sexual activity: Yes    Birth control/protection: None  Other Topics Concern   Not on file  Social History Narrative   Not on file   Social Drivers of Health   Financial Resource Strain: Not on file  Food Insecurity: No Food Insecurity (05/07/2024)   Hunger Vital Sign    Worried About Running Out of Food in the Last Year: Never true    Ran Out of Food in the Last Year: Never true  Transportation Needs: No Transportation Needs (05/07/2024)   PRAPARE - Administrator, Civil Service (Medical): No    Lack of Transportation (Non-Medical): No  Physical Activity: Not on file  Stress: Not on file  Social Connections: Socially Isolated (05/07/2024)   Social Connection and Isolation Panel    Frequency of Communication with Friends and Family: Never  Frequency of Social Gatherings with Friends and Family: Never    Attends Religious Services: Never    Database administrator or Organizations: No    Attends Banker Meetings: Never    Marital Status: Divorced  Catering manager Violence: Not At Risk (05/07/2024)   Humiliation, Afraid, Rape, and Kick questionnaire    Fear of Current or Ex-Partner: No    Emotionally Abused: No    Physically Abused: No    Sexually Abused: No    Family History  Problem Relation Age of Onset   Pneumonia Mother      Vitals:   06/24/24 1105 06/24/24 1106 06/24/24 1245 06/24/24 1400  BP: 139/79  (!) 163/89 (!) 138/117  Pulse: 93  93 97  Resp: 20  17 13   Temp: 98.3 F (36.8 C)     TempSrc: Oral     SpO2: 92%  95% 96%  Weight:  97.5 kg    Height:  6' (1.829 m)      PHYSICAL EXAM General: Well  appearing elderly male, well nourished, in no acute distress. HEENT: Normocephalic and atraumatic. Neck: + JVD.   Lungs: Normal respiratory effort on room air.  Diminished breath sounds bilaterally. Heart: HRRR. Normal S1 and S2, + murmur  Abdomen: Non-distended appearing.  Msk: Normal strength and tone for age. Extremities: Warm and well perfused. No clubbing, cyanosis, +2 pedal edema.  Neuro: Alert and oriented X 3. Psych: Answers questions appropriately.   Labs: Basic Metabolic Panel: Recent Labs    06/24/24 1108  NA 138  K 3.9  CL 98  CO2 28  GLUCOSE 104*  BUN 15  CREATININE 0.85  CALCIUM  9.6   Liver Function Tests: Recent Labs    06/24/24 1108  AST 26  ALT 17  ALKPHOS 50  BILITOT 1.6*  PROT 8.0  ALBUMIN 3.8   No results for input(s): LIPASE, AMYLASE in the last 72 hours. CBC: Recent Labs    06/24/24 1108  WBC 8.4  NEUTROABS 4.9  HGB 10.3*  HCT 34.6*  MCV 90.1  PLT 224   Cardiac Enzymes: Recent Labs    06/24/24 1108 06/24/24 1305  TROPONINIHS 17 18*   BNP: Recent Labs    06/24/24 1108  BNP 446.7*   D-Dimer: No results for input(s): DDIMER in the last 72 hours. Hemoglobin A1C: No results for input(s): HGBA1C in the last 72 hours. Fasting Lipid Panel: No results for input(s): CHOL, HDL, LDLCALC, TRIG, CHOLHDL, LDLDIRECT in the last 72 hours. Thyroid Function Tests: No results for input(s): TSH, T4TOTAL, T3FREE, THYROIDAB in the last 72 hours.  Invalid input(s): FREET3 Anemia Panel: No results for input(s): VITAMINB12, FOLATE, FERRITIN, TIBC, IRON , RETICCTPCT in the last 72 hours.   Radiology: DG Chest 2 View Result Date: 06/24/2024 CLINICAL DATA:  sob EXAM: CHEST - 2 VIEW COMPARISON:  June 03, 2024 FINDINGS: Biapical pleural thickening. No focal airspace consolidation, pleural effusion, or pneumothorax. No cardiomegaly. Sternotomy wires and CABG markers. Tortuous aorta with aortic atherosclerosis. No  acute fracture or destructive lesions. Multilevel thoracic osteophytosis. Osteopenia. IMPRESSION: No acute cardiopulmonary abnormality. Electronically Signed   By: Rogelia Myers M.D.   On: 06/24/2024 11:54   DG Chest 2 View Result Date: 06/05/2024 CLINICAL DATA:  Follow-up aspiration pneumonia. EXAM: CHEST - 2 VIEW COMPARISON:  05/09/2024. FINDINGS: Stable changes from prior CABG surgery. Cardiac silhouette is top-normal in size. No mediastinal or hilar masses. No convincing adenopathy. Lungs are hyperexpanded. Patchy airspace opacities noted in the left mid  to lower lung on the prior chest radiograph have mostly resolved. No new lung abnormalities. No pleural effusion or pneumothorax. Skeletal structures are intact. IMPRESSION: 1. Significant interval improvement in the left lower lobe aspiration pneumonitis/pneumonia. Electronically Signed   By: Alm Parkins M.D.   On: 06/05/2024 09:13    ECHO 05/10/2024 1. Left ventricular ejection fraction, by estimation, is 60 to 65%. The left ventricle has normal function. The left ventricle has no regional wall motion abnormalities. The left ventricular internal cavity size was mildly dilated. Left ventricular diastolic parameters are consistent with Grade I diastolic dysfunction (impaired relaxation).   2. Right ventricular systolic function is normal. The right ventricular size is normal.   3. The mitral valve is normal in structure. No evidence of mitral valve regurgitation.   4. The aortic valve is normal in structure. Aortic valve regurgitation is trivial. Severe aortic valve stenosis.   The aortic valve is normal in structure. Aortic valve regurgitation is trivial. Aortic regurgitation PHT measures 498 msec.  Severe aortic stenosis is present. Aortic valve mean gradient measures 39.0 mmHg. Aortic valve peak gradient measures 66.3 mmHg. Aortic valve area, by VTI measures 0.49 cm.   TELEMETRY reviewed by me 06/24/2024: sinus rhythm, rate 90s  EKG  reviewed by me: sinus rhythm, rate 91 bpm with non specific ST-T wave changes.  Data reviewed by me 06/24/2024: last 24h vitals tele labs imaging I/O ED provider note, admission H&P.  Principal Problem:   Symptomatic severe aortic stenosis with normal ejection fraction    ASSESSMENT AND PLAN:  [name] is a 63 y.o. 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 Patient has known severe aortic stenosis. Patient had scheduled LHC to further evaluate coronary arteries and aortic stenosis on 07/02 however patient no showed for scheduled procedure. Cardiology was consulted for further evaluation aortic stenosis.   # Moderate to severe Aortic Stenosis Patient with known severe AS. Patient had scheduled outpatient LHC (07/02 with Dr. Katina) to further evaluate coronary arteries and aortic stenosis, however patient never showed up for procedure. Prior echo from 04/2024 with Aortic valve mean gradient measures 39.0 mmHg. Aortic valve peak gradient measures 66.3 mmHg. Aortic valve area, by VTI measures 0.49 cm. -After adequate diuresis/when patient able to lay flat, consider inpatient LHC for further evaluation of aortic stenosis.  -Plan for CT TAVR at [facility] or with [facility] to evaluate TAVR candidacy.  -Recommend social work consult as patient has difficulties with medication management and staying on top of outpatient appointments.   # Acute on chronic HFpEF Patient presents with worsening SOB and lower extremity edema. Patient reports he hasn't been taking his PO lasix  at home. BNP elevated at 440. CXR with no acute cardiopulmonary disease. Prior echo from 04/2024 with pEF,  no RWMA, grade I diastolic dysfunction.  -Ordered IV lasix  40 mg daily. Closely monitor UOP and renal function.  -Ordered dapagliflozin  10 mg daily. (Copay $ 0) -Will plan to optimize GDMT (ARB, BB) as BP and renal function allow.    # Coronary artery disease s/p CABG x3 (04/2012) # Hypertension # Hyperlipidemia Patient presents with intermittent chest tightness, SOB worsens with deep breathing and exertion. Trops  negative x2. EKG with sinus rhythm, rate 91 bpm with non specific ST-T wave changes. -Lipid panel ordered. -Plan for possible LHC as stated above.  -Continue home Imdur  60 mg daily for antianginal management.  -Continue home pravastatin  20 mg daily. Consider increasing to high intensity statin if LDL level not at goal.  -Will plan to start metoprolol  when patient is closer to euvolemia. -Patient not on aspirin  due to history of multiple small bowel AVMs that have been on and off bleeding for years.  This patient's plan of care was discussed and created with Dr. Florencio and he is in agreement.  Signed: Dorene Comfort, PA-C  06/24/2024, 2:34 PM [facility]

## 2024-06-24 NOTE — ED Notes (Signed)
 This tech called [facility] and informed them that pt was on the way.

## 2024-06-24 NOTE — Hospital Course (Signed)
 Patient with PMH of CAD SP CABG, chronic HFpEF, severe aortic stenosis, HTN, HLD, obesity, COPD, recent GI bleed, alcohol  use disorder presented to the hospital with complaints of chest tightness, shortness of breath. Patient has known history of aortic stenosis and sees cardiology for that.  Was recommended to undergo left heart catheterization for possible evaluation of severity of stenosis for possibility of a TAVR. Seen by cardiology, treated with IV Lasix .

## 2024-06-24 NOTE — ED Triage Notes (Signed)
 First Nurse Note: Patient to ED from [facility] cardiology for CP, SOB and leg swelling.

## 2024-06-24 NOTE — ED Triage Notes (Signed)
 Pt c/o SOB that has worsened over 6 months with BLE swelling. Pt reports he had CP last night while going to the bathroom. Pt reports CP resolved after resting for a while. [facility] sent patient over for possible admission.

## 2024-06-25 DIAGNOSIS — N179 Acute kidney failure, unspecified: Secondary | ICD-10-CM | POA: Diagnosis present

## 2024-06-25 DIAGNOSIS — I2510 Atherosclerotic heart disease of native coronary artery without angina pectoris: Secondary | ICD-10-CM | POA: Diagnosis present

## 2024-06-25 DIAGNOSIS — I130 Hypertensive heart and chronic kidney disease with heart failure and stage 1 through stage 4 chronic kidney disease, or unspecified chronic kidney disease: Secondary | ICD-10-CM | POA: Diagnosis present

## 2024-06-25 DIAGNOSIS — I252 Old myocardial infarction: Secondary | ICD-10-CM | POA: Diagnosis not present

## 2024-06-25 DIAGNOSIS — I6523 Occlusion and stenosis of bilateral carotid arteries: Secondary | ICD-10-CM | POA: Diagnosis present

## 2024-06-25 DIAGNOSIS — I352 Nonrheumatic aortic (valve) stenosis with insufficiency: Secondary | ICD-10-CM | POA: Diagnosis present

## 2024-06-25 DIAGNOSIS — J449 Chronic obstructive pulmonary disease, unspecified: Secondary | ICD-10-CM | POA: Diagnosis present

## 2024-06-25 DIAGNOSIS — Z6829 Body mass index (BMI) 29.0-29.9, adult: Secondary | ICD-10-CM | POA: Diagnosis not present

## 2024-06-25 DIAGNOSIS — Z8673 Personal history of transient ischemic attack (TIA), and cerebral infarction without residual deficits: Secondary | ICD-10-CM | POA: Diagnosis not present

## 2024-06-25 DIAGNOSIS — F32A Depression, unspecified: Secondary | ICD-10-CM | POA: Diagnosis present

## 2024-06-25 DIAGNOSIS — I5033 Acute on chronic diastolic (congestive) heart failure: Secondary | ICD-10-CM | POA: Diagnosis present

## 2024-06-25 DIAGNOSIS — T502X5A Adverse effect of carbonic-anhydrase inhibitors, benzothiadiazides and other diuretics, initial encounter: Secondary | ICD-10-CM | POA: Diagnosis present

## 2024-06-25 DIAGNOSIS — E662 Morbid (severe) obesity with alveolar hypoventilation: Secondary | ICD-10-CM | POA: Diagnosis present

## 2024-06-25 DIAGNOSIS — Z1152 Encounter for screening for COVID-19: Secondary | ICD-10-CM | POA: Diagnosis not present

## 2024-06-25 DIAGNOSIS — D508 Other iron deficiency anemias: Secondary | ICD-10-CM | POA: Diagnosis not present

## 2024-06-25 DIAGNOSIS — D509 Iron deficiency anemia, unspecified: Secondary | ICD-10-CM | POA: Diagnosis present

## 2024-06-25 DIAGNOSIS — E538 Deficiency of other specified B group vitamins: Secondary | ICD-10-CM | POA: Diagnosis present

## 2024-06-25 DIAGNOSIS — Z604 Social exclusion and rejection: Secondary | ICD-10-CM | POA: Diagnosis present

## 2024-06-25 DIAGNOSIS — Z79899 Other long term (current) drug therapy: Secondary | ICD-10-CM | POA: Diagnosis not present

## 2024-06-25 DIAGNOSIS — F1010 Alcohol abuse, uncomplicated: Secondary | ICD-10-CM | POA: Diagnosis present

## 2024-06-25 DIAGNOSIS — N1831 Chronic kidney disease, stage 3a: Secondary | ICD-10-CM | POA: Diagnosis present

## 2024-06-25 DIAGNOSIS — K31819 Angiodysplasia of stomach and duodenum without bleeding: Secondary | ICD-10-CM | POA: Diagnosis present

## 2024-06-25 DIAGNOSIS — I10 Essential (primary) hypertension: Secondary | ICD-10-CM | POA: Diagnosis not present

## 2024-06-25 DIAGNOSIS — Z951 Presence of aortocoronary bypass graft: Secondary | ICD-10-CM | POA: Diagnosis not present

## 2024-06-25 DIAGNOSIS — Z720 Tobacco use: Secondary | ICD-10-CM | POA: Diagnosis not present

## 2024-06-25 DIAGNOSIS — E785 Hyperlipidemia, unspecified: Secondary | ICD-10-CM | POA: Diagnosis present

## 2024-06-25 DIAGNOSIS — I350 Nonrheumatic aortic (valve) stenosis: Secondary | ICD-10-CM | POA: Diagnosis not present

## 2024-06-25 DIAGNOSIS — I5031 Acute diastolic (congestive) heart failure: Secondary | ICD-10-CM | POA: Diagnosis not present

## 2024-06-25 LAB — COMPREHENSIVE METABOLIC PANEL WITH GFR
ALT: 14 U/L (ref 0–44)
AST: 23 U/L (ref 15–41)
Albumin: 3.6 g/dL (ref 3.5–5.0)
Alkaline Phosphatase: 49 U/L (ref 38–126)
Anion gap: 11 (ref 5–15)
BUN: 18 mg/dL (ref 8–23)
CO2: 32 mmol/L (ref 22–32)
Calcium: 9.2 mg/dL (ref 8.9–10.3)
Chloride: 95 mmol/L — ABNORMAL LOW (ref 98–111)
Creatinine, Ser: 1.18 mg/dL (ref 0.61–1.24)
GFR, Estimated: >60 mL/min (ref >60)
Glucose, Bld: 114 mg/dL — ABNORMAL HIGH (ref 70–99)
Potassium: 3.6 mmol/L (ref 3.5–5.1)
Sodium: 138 mmol/L (ref 135–145)
Total Bilirubin: 1.1 mg/dL (ref 0.0–1.2)
Total Protein: 7.8 g/dL (ref 6.5–8.1)

## 2024-06-25 LAB — CBC
HCT: 31.1 % — ABNORMAL LOW (ref 39.0–52.0)
Hemoglobin: 9.6 g/dL — ABNORMAL LOW (ref 13.0–17.0)
MCH: 27.1 pg (ref 26.0–34.0)
MCHC: 30.9 g/dL (ref 30.0–36.0)
MCV: 87.9 fL (ref 80.0–100.0)
Platelets: 227 K/uL (ref 150–400)
RBC: 3.54 MIL/uL — ABNORMAL LOW (ref 4.22–5.81)
RDW: 15.9 % — ABNORMAL HIGH (ref 11.5–15.5)
WBC: 8.5 K/uL (ref 4.0–10.5)
nRBC: 0 % (ref 0.0–0.2)

## 2024-06-25 LAB — HIV ANTIBODY (ROUTINE TESTING W REFLEX): HIV Screen 4th Generation wRfx: NONREACTIVE

## 2024-06-25 MED ORDER — POLYSACCHARIDE IRON COMPLEX 150 MG PO CAPS
150.0000 mg | ORAL_CAPSULE | Freq: Every day | ORAL | Status: DC
  Administered 2024-06-25 – 2024-06-27 (×3): 150 mg via ORAL
  Filled 2024-06-25 (×3): qty 1

## 2024-06-25 MED ORDER — METOPROLOL SUCCINATE ER 25 MG PO TB24
12.5000 mg | ORAL_TABLET | Freq: Every day | ORAL | Status: DC
  Administered 2024-06-25 – 2024-06-27 (×3): 12.5 mg via ORAL
  Filled 2024-06-25 (×3): qty 1

## 2024-06-25 MED ORDER — POTASSIUM CHLORIDE CRYS ER 20 MEQ PO TBCR
40.0000 meq | EXTENDED_RELEASE_TABLET | Freq: Once | ORAL | Status: AC
  Administered 2024-06-25: 40 meq via ORAL
  Filled 2024-06-25: qty 2

## 2024-06-25 MED ORDER — TORSEMIDE 20 MG PO TABS
20.0000 mg | ORAL_TABLET | Freq: Every day | ORAL | Status: DC
  Administered 2024-06-25 – 2024-06-26 (×2): 20 mg via ORAL
  Filled 2024-06-25 (×2): qty 1

## 2024-06-25 MED ORDER — SPIRONOLACTONE 25 MG PO TABS
25.0000 mg | ORAL_TABLET | Freq: Every day | ORAL | Status: DC
  Administered 2024-06-25: 25 mg via ORAL
  Filled 2024-06-25: qty 1

## 2024-06-25 MED ORDER — FOLIC ACID 1 MG PO TABS
1.0000 mg | ORAL_TABLET | Freq: Every day | ORAL | Status: DC
  Administered 2024-06-25 – 2024-06-27 (×3): 1 mg via ORAL
  Filled 2024-06-25 (×3): qty 1

## 2024-06-25 NOTE — Progress Notes (Signed)
 Transition of Care [facility]) - Inpatient Brief Assessment   Patient Details  Name: [name] MRN: [id number] Date of Birth: 07-Jun-1961  [facility]) CM/SW Contact:    Shizuo Biskup C Aleena Kirkeby, RN Phone Number: 06/25/2024, 12:06 PM   Clinical Narrative: TOC continuing to follow patient's progress throughout discharge planning.   Transition of Care Asessment:

## 2024-06-25 NOTE — Plan of Care (Signed)

## 2024-06-25 NOTE — Plan of Care (Signed)
   Problem: Education: Goal: Knowledge of General Education information will improve Description Including pain rating scale, medication(s)/side effects and non-pharmacologic comfort measures Outcome: Progressing   Problem: Health Behavior/Discharge Planning: Goal: Ability to manage health-related needs will improve Outcome: Progressing

## 2024-06-25 NOTE — Evaluation (Signed)
 Physical Therapy Evaluation Patient Details Name: [name] MRN: [id number] DOB: 1961-03-09 Today's Date: 06/25/2024  History of Present Illness  [name] is a 63 y.o. male  with a past medical history of CAD s/p CABG x 3 (04/2012), chronic HFpEF, moderate to severe aortic stenosis, hypertension, hyperlipidemia history of CVA, alcohol  use who presented to the ED on 05/06/2024 for epigastric pain and dark stools.  Patient underwent colonoscopy due to found melena with AVMs on capsule endoscopy.  After colonoscopy patient reported chest pain, noted elevated troponins, received multiple doses of morphine  and nitroglycerin . Chest x-ray revealed pneumonia  Clinical Impression  Pt reports he is not having pain or specific symptoms today and generally did well with PT exam.  He was able to move in bed, get to standing w/o assist.  He had a few small stagger steps during ambulation (to R more than L) but did not have any true LOBs.  Pt reports he has had multiple falls in the last 6 months, PT discussed and encouraged use of SPC.  Pt's O2 remained near 100% on baseline 2L t/o the effort.  HR from high 80s to low 100s with the 285ft of ambulation.  Pt will benefit from continued PT to address functional limitations.        If plan is discharge home, recommend the following:     Can travel by private vehicle        Equipment Recommendations    Recommendations for Other Services       Functional Status Assessment Patient has had a recent decline in their functional status and demonstrates the ability to make significant improvements in function in a reasonable and predictable amount of time.     Precautions / Restrictions Precautions Precautions: Fall Restrictions Weight Bearing Restrictions Per Provider Order: No      Mobility  Bed Mobility Overal bed mobility: Modified Independent             General bed mobility comments: able to get himself up to sitting EOB w/o assist     Transfers Overall transfer level: Needs assistance Equipment used: None Transfers: Sit to/from Stand Sit to Stand: Contact guard assist           General transfer comment: Pt was able to rise to standing without hesitation, showed reasonable confidence    Ambulation/Gait Ambulation/Gait assistance: Contact guard assist Gait Distance (Feet): 200 Feet Assistive device: None         General Gait Details: Pt able to circumambulate the nurses' station without AD, however he did have occasional low grade stagger stepping (to R>L) with self selected UE use on rails for stability at times.  He endorses some mild fatigue with the effort but SpO2 remains high 90s to 100% pre and post session on 2L  Stairs            Wheelchair Mobility     Tilt Bed    Modified Rankin (Stroke Patients Only)       Balance Overall balance assessment: Needs assistance Sitting-balance support: No upper extremity supported Sitting balance-Leahy Scale: Normal     Standing balance support: No upper extremity supported Standing balance-Leahy Scale: Fair Standing balance comment: R lateral lean with minimal occasional stagger stepping w/o LOBs during dynamic standing/ambulation tasks                             Pertinent Vitals/Pain Pain Assessment Pain Assessment: No/denies pain (does endorse  numbness in b/l feet)    Home Living Family/patient expects to be discharged to:: Private residence Living Arrangements: Alone Available Help at Discharge: Friend(s);Available PRN/intermittently Type of Home: House Home Access: Level entry       Home Layout: Two level;Able to live on main level with bedroom/bathroom Home Equipment: Agricultural consultant (2 wheels) Additional Comments: reports living in a friends guest house    Prior Function Prior Level of Function : Independent/Modified Independent             Mobility Comments: reports 5 or 6 falls in the last 6 months ADLs  Comments: 2L O2 baseline PRN at home; eats out vs makes simple meals     Extremity/Trunk Assessment   Upper Extremity Assessment Upper Extremity Assessment: Generalized weakness;Overall [facility] for tasks assessed    Lower Extremity Assessment Lower Extremity Assessment: Generalized weakness;Overall WFL for tasks assessed       Communication   Communication Communication: No apparent difficulties Factors Affecting Communication: Hearing impaired    Cognition Arousal: Alert Behavior During Therapy: WFL for tasks assessed/performed   PT - Cognitive impairments: No apparent impairments                         Following commands: Intact       Cueing       General Comments General comments (skin integrity, edema, etc.): Pt generally did well, reports he is not able to do prolonged bouts of ambulation but overall he is feeling close to his baseline    Exercises     Assessment/Plan    PT Assessment Patient needs continued PT services  PT Problem List Decreased strength;Decreased range of motion;Decreased activity tolerance;Decreased balance;Decreased mobility;Decreased coordination;Decreased safety awareness;Cardiopulmonary status limiting activity       PT Treatment Interventions Gait training;Functional mobility training;Therapeutic activities;Therapeutic exercise;Balance training;Patient/family education    PT Goals (Current goals can be found in the Care Plan section)  Acute Rehab PT Goals Patient Stated Goal: go home PT Goal Formulation: With patient Time For Goal Achievement: 07/08/24 Potential to Achieve Goals: Good    Frequency Min 1X/week     Co-evaluation               AM-PAC PT 6 Clicks Mobility  Outcome Measure Help needed turning from your back to your side while in a flat bed without using bedrails?: None Help needed moving from lying on your back to sitting on the side of a flat bed without using bedrails?: None Help needed moving  to and from a bed to a chair (including a wheelchair)?: None Help needed standing up from a chair using your arms (e.g., wheelchair or bedside chair)?: A Little Help needed to walk in hospital room?: A Little Help needed climbing 3-5 steps with a railing? : A Little 6 Click Score: 21    End of Session Equipment Utilized During Treatment: Gait belt Activity Tolerance: Patient tolerated treatment well Patient left: with chair alarm set;with call bell/phone within reach Nurse Communication: Mobility status PT Visit Diagnosis: Muscle weakness (generalized) (M62.81);Difficulty in walking, not elsewhere classified (R26.2)    Time: 9181-9157 PT Time Calculation (min) (ACUTE ONLY): 24 min   Charges:   PT Evaluation $PT Eval Low Complexity: 1 Low PT Treatments $Gait Training: 8-22 mins PT General Charges $$ ACUTE PT VISIT: 1 Visit         Carmin JONELLE Deed, DTaP 06/25/2024, 9:08 AM

## 2024-06-25 NOTE — Progress Notes (Signed)
 [facility] CLINIC CARDIOLOGY PROGRESS NOTE       Patient ID: [name] MRN: [id number] DOB/AGE: [birth year] 63 y.o.  Admit date: 06/24/2024 Referring Physician Dr. Yetta Blanch Primary Physician Osa Geralds, NP Primary Cardiologist Tinnie Maiden, NP Reason for Consultation Aortic Stenosis  HPI: [name] is a 63 y.o. 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 Patient has known severe aortic stenosis. Patient had scheduled LHC to further evaluate coronary arteries and aortic stenosis on 07/02 however patient no showed for scheduled procedure. Cardiology was consulted for further evaluation aortic stenosis.   Interval History: -Patient seen and examined this AM and sitting up in bedside chair eating breakfast. Patient states his SOB has improved compared to yesterday, however not at baseline and denies chest pain.  -Patients BP and HR stable this AM. Overnight Tele showed no significant events.  -Yesterday UOP 1L. -Patient remains on 2L with stable SpO2.    Pertinent Cardiac History (Most recent) -Echo 05/10/2024 1. Left ventricular ejection fraction, by estimation, is 60 to 65%. The left ventricle has normal function. The left ventricle has no regional wall motion abnormalities. The left ventricular internal cavity size was mildly dilated. Left ventricular diastolic parameters are consistent with Grade I diastolic dysfunction (impaired relaxation).   2. Right ventricular systolic function is normal. The right ventricular size is normal.   3. The mitral valve is normal in structure. No evidence of mitral valve regurgitation.   4.  The aortic valve is normal in structure. Aortic valve regurgitation is trivial. Severe aortic valve stenosis.   The aortic valve is normal in structure. Aortic valve regurgitation is trivial. Aortic regurgitation PHT measures 498 msec.  Severe aortic stenosis is present. Aortic valve mean gradient measures 39.0 mmHg. Aortic valve peak gradient measures 66.3 mmHg. Aortic valve area, by VTI measures 0.49 cm.    Review of systems complete and found to be negative unless listed above    Past Medical History:  Diagnosis Date   COPD (chronic obstructive pulmonary disease) (HCC)    CVA (cerebral infarction)    Headache    mild, since stroke 2012   Hypertension    MI (myocardial infarction) (HCC) 05/09/2012   Reading difficulty    pt reports he reads at about a second grade level   Stroke [facility]) 2012   numbness to left hand    Wears dentures    full upper and lower    Past Surgical History:  Procedure Laterality Date   CAROTID ENDARTERECTOMY Left    2012 or 2013   CHOLECYSTECTOMY N/A 07/26/2016   Procedure: LAPAROSCOPIC CHOLECYSTECTOMY;  Surgeon: Charlie FORBES Fell, MD;  Location: [facility] ORS;  Service: General;  Laterality: N/A;   COLONOSCOPY N/A 05/08/2024   Procedure: COLONOSCOPY;  Surgeon: Jinny Carmine, MD;  Location: [facility] ENDOSCOPY;  Service: Endoscopy;  Laterality: N/A;   COLONOSCOPY WITH PROPOFOL  N/A 02/09/2022   Procedure: COLONOSCOPY WITH PROPOFOL ;  Surgeon: Jinny Carmine, MD;  Location: [facility] ENDOSCOPY;  Service: Endoscopy;  Laterality: N/A;   CORONARY ARTERY BYPASS GRAFT  05/10/2012   3 vessel   ENTEROSCOPY N/A 05/16/2023   Procedure: ENTEROSCOPY;  Surgeon: Therisa Bi, MD;  Location: [facility] ENDOSCOPY;  Service: Gastroenterology;  Laterality: N/A;   ENTEROSCOPY N/A 06/01/2023   Procedure: ENTEROSCOPY;  Surgeon: Unk Corinn Skiff, MD;  Location: [facility] ENDOSCOPY;  Service: Gastroenterology;  Laterality: N/A;   ESOPHAGOGASTRODUODENOSCOPY  05/13/2023   Procedure: ESOPHAGOGASTRODUODENOSCOPY  (EGD);  Surgeon: Jinny Carmine, MD;  Location: [facility] ENDOSCOPY;  Service: Endoscopy;;   ESOPHAGOGASTRODUODENOSCOPY (EGD) WITH PROPOFOL  N/A 10/28/2022   Procedure: ESOPHAGOGASTRODUODENOSCOPY (EGD) WITH PROPOFOL ;  Surgeon: Onita Elspeth Sharper, DO;  Location: [facility] ENDOSCOPY;  Service: Gastroenterology;  Laterality: N/A;   GIVENS CAPSULE STUDY  05/13/2023   Procedure: GIVENS CAPSULE STUDY;  Surgeon: Jinny Carmine, MD;  Location: [facility] ENDOSCOPY;  Service: Endoscopy;;   GIVENS CAPSULE STUDY  06/01/2023   Procedure: GIVENS CAPSULE STUDY;  Surgeon: Unk Corinn Skiff, MD;  Location: [facility] ENDOSCOPY;  Service: Gastroenterology;;    Medications Prior to Admission  Medication Sig Dispense Refill Last Dose/Taking   albuterol  (VENTOLIN  HFA) 108 (90 Base) MCG/ACT inhaler Inhale 2 puffs into the lungs every 6 (six) hours as needed for wheezing or shortness of breath. 6.7 g 2 Taking As Needed   ascorbic acid  (VITAMIN C ) 500 MG tablet Take 1 tablet (500 mg total) by mouth daily. 30 tablet 2 Past Month   folic acid  (FOLVITE ) 1 MG tablet Take 1 tablet (1 mg total) by mouth daily. 30 tablet 2 Past Month   iron  polysaccharides (NIFEREX) 150 MG capsule Take 1 capsule (150 mg total) by mouth daily. 30 capsule 2 Past Month   isosorbide  mononitrate (IMDUR ) 60 MG 24 hr tablet Take 1 tablet (60 mg total) by mouth daily. 30 tablet 11 Past Month   pravastatin  (PRAVACHOL ) 20 MG tablet Take 1 tablet (20 mg total) by mouth at bedtime. 20 tablet 11 Past Month   Vitamin D , Ergocalciferol , (DRISDOL ) 1.25 MG (50000 UNIT) CAPS capsule Take 1 capsule (50,000 Units total) by mouth every 7 (seven) days. 12 capsule 0 Past Week   pantoprazole  (PROTONIX ) 40 MG tablet Take 1 tablet (40 mg total) by mouth daily. 30 tablet 2    Social History   Socioeconomic History   Marital status: Divorced    Spouse name: Not on file   Number of children: Not on file   Years of education: Not on file   Highest education level: Not on file   Occupational History   Not on file  Tobacco Use   Smoking status: Former    Current packs/day: 0.00    Types: Cigarettes    Quit date: 26    Years since quitting: 32.5   Smokeless tobacco: Current    Types: Snuff   Tobacco comments:    changed to dip  Vaping Use   Vaping status: Never Used  Substance and Sexual Activity   Alcohol  use: Yes    Alcohol /week: 26.0 standard drinks of alcohol     Types: 26 Cans of beer per week   Drug use: No   Sexual activity: Yes    Birth control/protection: None  Other Topics Concern   Not on file  Social History Narrative   Not on file   Social Drivers of Health   Financial Resource Strain: Not on file  Food Insecurity: No Food Insecurity (06/24/2024)   Hunger Vital Sign    Worried About Running Out of Food in the Last Year: Never true    Ran Out of Food in the Last Year: Never true  Transportation Needs: No Transportation Needs (06/24/2024)   PRAPARE - Administrator, Civil Service (Medical): No  Lack of Transportation (Non-Medical): No  Physical Activity: Not on file  Stress: Not on file  Social Connections: Socially Isolated (06/24/2024)   Social Connection and Isolation Panel    Frequency of Communication with Friends and Family: Never    Frequency of Social Gatherings with Friends and Family: Never    Attends Religious Services: Never    Database administrator or Organizations: No    Attends Banker Meetings: Never    Marital Status: Divorced  Catering manager Violence: Not At Risk (06/24/2024)   Humiliation, Afraid, Rape, and Kick questionnaire    Fear of Current or Ex-Partner: No    Emotionally Abused: No    Physically Abused: No    Sexually Abused: No    Family History  Problem Relation Age of Onset   Pneumonia Mother      Vitals:   06/24/24 2341 06/25/24 0312 06/25/24 0353 06/25/24 0750  BP: 111/72  122/83 115/72  Pulse: (!) 104  85 85  Resp: 16  20 15   Temp: (!) 97.5 F (36.4 C)  97.8 F  (36.6 C) 98.6 F (37 C)  TempSrc:      SpO2: 100%  98% 99%  Weight:  94.3 kg    Height:        PHYSICAL EXAM General: Well appearing elderly male, well nourished, in no acute distress. HEENT: Normocephalic and atraumatic. Neck: + JVD.   Lungs: Normal respiratory effort on 2L.  Diminished breath sounds bilaterally (improving). Heart: HRRR. Normal S1 and S2, + murmur  Abdomen: Non-distended appearing.  Msk: Normal strength and tone for age. Extremities: Warm and well perfused. No clubbing, cyanosis, +2 pedal edema.  Neuro: Alert and oriented X 3. Psych: Answers questions appropriately.   Labs: Basic Metabolic Panel: Recent Labs    06/24/24 1108 06/25/24 0424  NA 138 138  K 3.9 3.6  CL 98 95*  CO2 28 32  GLUCOSE 104* 114*  BUN 15 18  CREATININE 0.85 1.18  CALCIUM  9.6 9.2   Liver Function Tests: Recent Labs    06/24/24 1108 06/25/24 0424  AST 26 23  ALT 17 14  ALKPHOS 50 49  BILITOT 1.6* 1.1  PROT 8.0 7.8  ALBUMIN 3.8 3.6   No results for input(s): LIPASE, AMYLASE in the last 72 hours. CBC: Recent Labs    06/24/24 1108 06/25/24 0424  WBC 8.4 8.5  NEUTROABS 4.9  --   HGB 10.3* 9.6*  HCT 34.6* 31.1*  MCV 90.1 87.9  PLT 224 227   Cardiac Enzymes: Recent Labs    06/24/24 1108 06/24/24 1305  TROPONINIHS 17 18*   BNP: Recent Labs    06/24/24 1108  BNP 446.7*   D-Dimer: No results for input(s): DDIMER in the last 72 hours. Hemoglobin A1C: No results for input(s): HGBA1C in the last 72 hours. Fasting Lipid Panel: Recent Labs    06/24/24 1305  CHOL 157  HDL 50  LDLCALC 98  TRIG 43  CHOLHDL 3.1   Thyroid Function Tests: No results for input(s): TSH, T4TOTAL, T3FREE, THYROIDAB in the last 72 hours.  Invalid input(s): FREET3 Anemia Panel: No results for input(s): VITAMINB12, FOLATE, FERRITIN, TIBC, IRON , RETICCTPCT in the last 72 hours.   Radiology: DG Chest 2 View Result Date: 06/24/2024 CLINICAL DATA:  sob  EXAM: CHEST - 2 VIEW COMPARISON:  June 03, 2024 FINDINGS: Biapical pleural thickening. No focal airspace consolidation, pleural effusion, or pneumothorax. No cardiomegaly. Sternotomy wires and CABG markers. Tortuous aorta with aortic atherosclerosis.  No acute fracture or destructive lesions. Multilevel thoracic osteophytosis. Osteopenia. IMPRESSION: No acute cardiopulmonary abnormality. Electronically Signed   By: Rogelia Myers M.D.   On: 06/24/2024 11:54   DG Chest 2 View Result Date: 06/05/2024 CLINICAL DATA:  Follow-up aspiration pneumonia. EXAM: CHEST - 2 VIEW COMPARISON:  05/09/2024. FINDINGS: Stable changes from prior CABG surgery. Cardiac silhouette is top-normal in size. No mediastinal or hilar masses. No convincing adenopathy. Lungs are hyperexpanded. Patchy airspace opacities noted in the left mid to lower lung on the prior chest radiograph have mostly resolved. No new lung abnormalities. No pleural effusion or pneumothorax. Skeletal structures are intact. IMPRESSION: 1. Significant interval improvement in the left lower lobe aspiration pneumonitis/pneumonia. Electronically Signed   By: Alm Parkins M.D.   On: 06/05/2024 09:13    ECHO 05/10/2024 1. Left ventricular ejection fraction, by estimation, is 60 to 65%. The left ventricle has normal function. The left ventricle has no regional wall motion abnormalities. The left ventricular internal cavity size was mildly dilated. Left ventricular diastolic parameters are consistent with Grade I diastolic dysfunction (impaired relaxation).   2. Right ventricular systolic function is normal. The right ventricular size is normal.   3. The mitral valve is normal in structure. No evidence of mitral valve regurgitation.   4. The aortic valve is normal in structure. Aortic valve regurgitation is trivial. Severe aortic valve stenosis.   The aortic valve is normal in structure. Aortic valve regurgitation is trivial. Aortic regurgitation PHT measures 498  msec.  Severe aortic stenosis is present. Aortic valve mean gradient measures 39.0 mmHg. Aortic valve peak gradient measures 66.3 mmHg. Aortic valve area, by VTI measures 0.49 cm.   TELEMETRY reviewed by me 06/25/2024: sinus rhythm, rate 70s  EKG reviewed by me: sinus rhythm, rate 91 bpm with non specific ST-T wave changes.  Data reviewed by me 06/25/2024: last 24h vitals tele labs imaging I/O hospitalist progress notes.  Principal Problem:   Symptomatic severe aortic stenosis with normal ejection fraction Active Problems:   Hyperlipidemia   Benign essential HTN   Bilateral carotid artery stenosis   History of stroke s/p left carotid endarterectomy   Acute diastolic CHF (congestive heart failure) (HCC)   Essential hypertension   Dyslipidemia   Obesity hypoventilation syndrome (HCC)   Chest pain   CAD (coronary artery disease)   Iron  deficiency anemia   Depression   Chronic obstructive pulmonary disease (COPD) (HCC)    ASSESSMENT AND PLAN:  [name] is a 63 y.o. 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 Patient has known severe aortic stenosis. Patient had scheduled LHC to further evaluate coronary arteries and aortic stenosis on 07/02 however patient no showed for scheduled procedure. Cardiology was consulted for further evaluation aortic stenosis.   # Moderate to severe Aortic Stenosis Patient with known severe AS. Patient had scheduled outpatient LHC (07/02 with Dr. Katina) to further evaluate coronary arteries and aortic stenosis, however patient never showed up for procedure. Prior echo from 04/2024 with Aortic valve mean gradient measures  39.0 mmHg. Aortic valve peak gradient measures 66.3 mmHg. Aortic valve area, by VTI measures 0.49 cm. -Plan for reschedule outpatient LHC with Dr. Katina  to further evaluate aortic stenosis and coronary arteries.  -Plan for CT TAVR at [facility] or with [facility] to evaluate TAVR candidacy.  -Recommend social work consult as patient has difficulties with medication management and staying on top of outpatient appointments.   # Acute on chronic HFpEF Patient presents with worsening SOB and lower extremity edema. Patient reports he hasn't been taking his PO lasix  at home. BNP elevated at 440. CXR with no acute cardiopulmonary disease. Prior echo from 04/2024 with pEF, no RWMA, grade I diastolic dysfunction.  -Continue IV lasix  40 mg daily for today, plan to transition to PO torsemide  20 mg daily tomorrow. Closely monitor UOP and renal function.  -Continue dapagliflozin  10 mg daily. (Copay $ 0) -Ordered Spironolactone  25 mg daily.  -Metoprolol  as stated below.  -Will plan to optimize GDMT (ARB) as BP and renal function allow.    # Coronary artery disease s/p CABG x3 (04/2012) # Hypertension # Hyperlipidemia Patient presents with intermittent chest tightness, SOB worsens with deep breathing and exertion. Trops negative x2. EKG with sinus rhythm, rate 91 bpm with non specific ST-T wave changes. LDL 90, total cholesterol 842. -Plan for outpatient LHC as stated above.  -Continue home Imdur  60 mg daily for antianginal management.  -Continue home pravastatin  20 mg daily. -Order metoprolol  succinate 12.5 mg daily.  -Patient not on aspirin  due to history of multiple small bowel AVMs that have been on and off bleeding for years.  This patient's plan of care was discussed and created with Dr. Florencio and he is in agreement.  Signed: Dorene Comfort, PA-C  06/25/2024, 9:49 AM [facility] Cardiology

## 2024-06-25 NOTE — Care Management Obs Status (Signed)
 MEDICARE OBSERVATION STATUS NOTIFICATION   Patient Details  Name: [name] MRN: [id number] Date of Birth: 01-13-1961   Medicare Observation Status Notification Given:  No (patientd did not want a copy)    [name] 06/25/2024, 11:25 AM

## 2024-06-25 NOTE — Evaluation (Addendum)
 Occupational Therapy Evaluation Patient Details Name: [name] MRN: [id number] DOB: Feb 06, 1961 Today's Date: 06/25/2024   History of Present Illness   Pt is a 63 y.o. male admitted for aortic stenosis, SOB and BLE swelling. 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     Clinical Impressions Pt admitted with above diagnosis. Prior to hospital admission, pt was independent in ADLs/mobility/driving. Friend assists with medication management, and pt states he does not always remember to take meds at scheduled time. Pt noted to have deficits in processing, decreased safety awareness and problem-solving, as evidenced by requiring increased time to answer questions and difficulties calling dining services for food order. Short Blessed Test completed, pt scoring a 8/28 indicating questionable cognitive impairment. Anticipate will require supervision and increased levels of assist for higher level IADLs such as med / financial mgmt, meal prep and appointment reminders.   Pt performs bed mobility mod independent, transfers from bed to recliner stand pivot with supervision, no AD. Pt required verbal cues for fall prevention, as pt attempts to stand from bed with L foot tangled in blanket. UB dressing completed sitting in recliner with setup, anticipate pt will require up to MAX A for donning socks as he does not wear them at home. Pt declines further ADLs or mobility due to fatigue levels. Pt would benefit from skilled OT services to address noted impairments and functional limitations (see below for any additional details) in order to maximize safety and independence while minimizing falls risk and caregiver burden. Anticipate the need for follow up [facility] OT services upon acute  hospital DC.      If plan is discharge home, recommend the following:   A little help with walking and/or transfers;A little help with bathing/dressing/bathroom;Direct supervision/assist for medications management;Direct supervision/assist for financial management;Assist for transportation;Help with stairs or ramp for entrance;Supervision due to cognitive status     Functional Status Assessment   Patient has had a recent decline in their functional status and demonstrates the ability to make significant improvements in function in a reasonable and predictable amount of time.     Equipment Recommendations   None recommended by OT      Precautions/Restrictions   Precautions Precautions: Fall Restrictions Weight Bearing Restrictions Per Provider Order: No     Mobility Bed Mobility Overal bed mobility: Modified Independent             General bed mobility comments: recieved sitting EOB    Transfers Overall transfer level: Needs assistance Equipment used: None Transfers: Sit to/from Stand Sit to Stand: Supervision, Contact guard assist           General transfer comment: able to stand easily without LOB      Balance Overall balance assessment: Needs assistance Sitting-balance support: No upper extremity supported Sitting balance-Leahy Scale: Normal     Standing balance support: No upper extremity supported Standing balance-Leahy Scale: Fair                             ADL either performed or assessed with clinical judgement   ADL Overall ADL's : Needs assistance/impaired Eating/Feeding: Sitting;Independent Eating/Feeding Details (indicate cue type and reason): pt recieved sitting EOB. able to feed self, open containers  and manipulate utensils without difficulties.             Upper Body Dressing : Minimal assistance;Sitting   Lower Body Dressing: Maximal assistance Lower Body Dressing Details (indicate cue type and reason):  anticipate pt will require up to max A for donning socks - reports does not wear socks at home, and he wears slip in shoes Toilet Transfer: Supervision/safety;BSC/3in1;Stand-pivot Toilet Transfer Details (indicate cue type and reason): simulated bed > recliner stand pivot Toileting- Clothing Manipulation and Hygiene: Minimal assistance;Sit to/from stand Toileting - Clothing Manipulation Details (indicate cue type and reason): clinical judgement     Functional mobility during ADLs: Supervision/safety        Pertinent Vitals/Pain Pain Assessment Pain Assessment: No/denies pain     Extremity/Trunk Assessment Upper Extremity Assessment Upper Extremity Assessment: Generalized weakness;Overall [facility] for tasks assessed   Lower Extremity Assessment Lower Extremity Assessment: Overall WFL for tasks assessed;Generalized weakness   Cervical / Trunk Assessment Cervical / Trunk Assessment: Normal   Communication Communication Communication: No apparent difficulties Factors Affecting Communication: Hearing impaired   Cognition Arousal: Alert Behavior During Therapy: WFL for tasks assessed/performed Cognition: No family/caregiver present to determine baseline, Cognition impaired     Awareness: Intellectual awareness impaired Memory impairment (select all impairments): Short-term memory, Working memory Attention impairment (select first level of impairment): Sustained attention Executive functioning impairment (select all impairments): Initiation, Organization, Sequencing, Reasoning, Problem solving OT - Cognition Comments: Pt A&Ox4 when given increased time to process questions. Pt noted with difficulties operating phone and reading menu.                 Following commands: Intact       Cueing  General Comments   Cueing Techniques: Verbal cues  VSS   Exercises Other Exercises Other Exercises: Short Blessed Test performed, pt scoring 8/28 indicating questionable  impairment.        Home Living Family/patient expects to be discharged to:: Private residence Living Arrangements: Alone Available Help at Discharge: Friend(s);Available PRN/intermittently Type of Home: House Home Access: Level entry     Home Layout: Two level;Able to live on main level with bedroom/bathroom     Bathroom Shower/Tub: Chief Strategy Officer: Standard Bathroom Accessibility: Yes How Accessible: Accessible via walker Home Equipment: Rolling Walker (2 wheels)   Additional Comments: reports living in a friends guest house      Prior Functioning/Environment Prior Level of Function : Independent/Modified Independent             Mobility Comments: reports 5 or 6 falls in the last 6 months ADLs Comments: 2L O2 baseline PRN at home; eats out vs makes simple meals, friend's wife assists with med mgmt but pt often forgets to take AM/PM meds    OT Problem List: Cardiopulmonary status limiting activity;Decreased cognition;Decreased activity tolerance;Decreased safety awareness;Decreased knowledge of use of DME or AE;Impaired balance (sitting and/or standing)   OT Treatment/Interventions: Self-care/ADL training;Therapeutic exercise;Therapeutic activities;Neuromuscular education;Cognitive remediation/compensation;Energy conservation;DME and/or AE instruction;Patient/family education;Balance training      OT Goals(Current goals can be found in the care plan section)   Acute Rehab OT Goals OT Goal Formulation: With patient Time For Goal Achievement: 07/09/24 Potential to Achieve Goals: Good   OT Frequency:  Min 2X/week       AM-PAC OT 6 Clicks Daily Activity     Outcome Measure Help from another person eating meals?: None Help from another person taking care of personal grooming?: None Help from another person toileting, which includes  using toliet, bedpan, or urinal?: A Little Help from another person bathing (including washing, rinsing,  drying)?: A Little Help from another person to put on and taking off regular upper body clothing?: None Help from another person to put on and taking off regular lower body clothing?: A Little 6 Click Score: 21   End of Session Equipment Utilized During Treatment: Oxygen Nurse Communication: Mobility status (pt requesting ham and cheese sandwich)  Activity Tolerance: Patient tolerated treatment well;Patient limited by fatigue Patient left: in chair;with call bell/phone within reach;with chair alarm set  OT Visit Diagnosis: History of falling (Z91.81);Other abnormalities of gait and mobility (R26.89);Unsteadiness on feet (R26.81)                Time: 8640-8576 OT Time Calculation (min): 24 min Charges:  OT General Charges $OT Visit: 1 Visit OT Evaluation $OT Eval Low Complexity: 1 Low OT Treatments $Self Care/Home Management : 8-22 mins Cashus Halterman L. Arla Boutwell, OTR/L  06/25/24, 3:37 PM

## 2024-06-25 NOTE — Progress Notes (Signed)
 Progress Note   Patient: [name] FMW:[id number] DOB: 17-Jul-1961 DOA: 06/24/2024     0 DOS: the patient was seen and examined on 06/25/2024   Brief hospital course: Patient with PMH of CAD SP CABG, chronic HFpEF, severe aortic stenosis, HTN, HLD, obesity, COPD, recent GI bleed, alcohol  use disorder presented to the hospital with complaints of chest tightness, shortness of breath. Patient has known history of aortic stenosis and sees cardiology for that.  Was recommended to undergo left heart catheterization for possible evaluation of severity of stenosis for possibility of a TAVR. Seen by cardiology, treated with IV Lasix .      Principal Problem:   Symptomatic severe aortic stenosis with normal ejection fraction Active Problems:   Iron  deficiency anemia   Chest pain   CAD (coronary artery disease)   Acute diastolic CHF (congestive heart failure) (HCC)   Obesity hypoventilation syndrome (HCC)   Hyperlipidemia   Essential hypertension   Benign essential HTN   Bilateral carotid artery stenosis   History of stroke s/p left carotid endarterectomy   Dyslipidemia   Depression   Chronic obstructive pulmonary disease (COPD) (HCC)   Acute on chronic diastolic (congestive) heart failure (HCC)   Assessment and Plan: Moderate to severe aortic stenosis. Acute on chronic diastolic congestive heart failure Patient is a followed by cardiology, treated with IV Lasix .  Condition improving.  Discussed with cardiology, they prefer to keep patient for another day before discharge tomorrow. Heart cath will be scheduled as outpatient cardiology.  Most likely patient be discharged home tomorrow.     Chest tightness with minimal elevated troponin. CAD SP CABG. HTN. HLD. Troponin 18, chest tightness is secondary to aortic stenosis. Continue to follow.  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  Hemoglobin has been stable,  continue iron  supplement.  Continue folic acid    Depression. Continue home regimen.   HLD. Continue statin.   Overweight Body mass index is 29.16 kg/m.  Placing the patient at high risk of poor outcome.   Alcohol  use. Denies any history of abuse.  Reports that he drinks 2 beers every other day.  For now monitor.        Subjective:  Patient short of breath much improved.  No cough.  Physical Exam: Vitals:   06/25/24 0312 06/25/24 0353 06/25/24 0750 06/25/24 1147  BP:  122/83 115/72 119/70  Pulse:  85 85 92  Resp:  20 15 18   Temp:  97.8 F (36.6 C) 98.6 F (37 C) (!) 97.4 F (36.3 C)  TempSrc:      SpO2:  98% 99% 100%  Weight: 94.3 kg     Height:       General exam: Appears calm and comfortable  Respiratory system: Clear to auscultation. Respiratory effort normal. Cardiovascular system: S1 & S2 heard, RRR. 2/6 SM  No pedal edema. Gastrointestinal system: Abdomen is nondistended, soft and nontender. No organomegaly or masses felt. Normal bowel sounds heard. Central nervous system: Alert and oriented. No focal neurological deficits. Extremities: Symmetric 5 x 5 power. Skin: No rashes, lesions or ulcers Psychiatry: Judgement and insight appear normal. Mood & affect appropriate.    Data Reviewed:  CXR and the lab results reviewed  Family Communication: None  Disposition: Status is: Inpatient Remains inpatient appropriate because: Severity of disease, IV treatment.     Time spent: 35 minutes  Author: Murvin Mana, MD 06/25/2024 1:27 PM  For on call review [email or web address].

## 2024-06-25 NOTE — Progress Notes (Signed)
 Heart Failure Navigator Progress Note  Assessed for Heart & Vascular TOC clinic readiness.  Patient does not meet criteria due to current [facility] patient.   Navigator will sign off at this time.  Roxy Horseman, RN, BSN [facility] Heart Failure Navigator Secure Chat Only

## 2024-06-26 DIAGNOSIS — I5033 Acute on chronic diastolic (congestive) heart failure: Secondary | ICD-10-CM | POA: Diagnosis not present

## 2024-06-26 DIAGNOSIS — D508 Other iron deficiency anemias: Secondary | ICD-10-CM

## 2024-06-26 DIAGNOSIS — I350 Nonrheumatic aortic (valve) stenosis: Secondary | ICD-10-CM | POA: Diagnosis not present

## 2024-06-26 LAB — BASIC METABOLIC PANEL WITH GFR
Anion gap: 10 (ref 5–15)
BUN: 18 mg/dL (ref 8–23)
CO2: 32 mmol/L (ref 22–32)
Calcium: 9.2 mg/dL (ref 8.9–10.3)
Chloride: 95 mmol/L — ABNORMAL LOW (ref 98–111)
Creatinine, Ser: 1.14 mg/dL (ref 0.61–1.24)
GFR, Estimated: >60 mL/min (ref >60)
Glucose, Bld: 98 mg/dL (ref 70–99)
Potassium: 4.1 mmol/L (ref 3.5–5.1)
Sodium: 137 mmol/L (ref 135–145)

## 2024-06-26 MED ORDER — SPIRONOLACTONE 12.5 MG HALF TABLET
12.5000 mg | ORAL_TABLET | Freq: Every day | ORAL | Status: DC
  Administered 2024-06-26 – 2024-06-27 (×2): 12.5 mg via ORAL
  Filled 2024-06-26 (×2): qty 1

## 2024-06-26 MED ORDER — TORSEMIDE 20 MG PO TABS
20.0000 mg | ORAL_TABLET | Freq: Once | ORAL | Status: AC
  Administered 2024-06-26: 20 mg via ORAL
  Filled 2024-06-26: qty 1

## 2024-06-26 NOTE — Progress Notes (Signed)
 Occupational Therapy Treatment Patient Details Name: [name] MRN: [id number] DOB: Mar 10, 1961 Today's Date: 06/26/2024   History of present illness Pt is a 63 y.o. male admitted for aortic stenosis, SOB and BLE swelling. 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   OT comments  Pt seen for OT tx. Pt mod indep with bed mobility, sat EOB for administration of SLUMS test:    [facility] Mental Status Examination Orientation: 2/3  Calculations: 1/3 Naming animals: 2/3 Patient named 11 animals (0 points is 0-4 animals; 1 is 5-9 animals; 2 is 10-14 animals; 3 is 15+ animals) Recall: 1/5  Attention: 2/2 Clock drawing: 2/4 Visual Processing: 2/2 Paragraph Memory: 4/8  Total: 16/30 Despite level of education, this score falls in the dementia range. OT facilitated problem solving for IADL Tasks including meal prep and med mgt. Pt notes that he uses a weekly pill box. Denies difficulty. Endorses fear of cooking but is able to use microwave for meals and make things like sandwiches. Pt demonstrating cognitive impairment indicating questionable safety with independent living. Will continue to benefit from skilled OT services. Strongly recommend increased supervision/assist support upon discharge.       If plan is discharge home, recommend the following:  A little help with walking and/or transfers;A little help with bathing/dressing/bathroom;Direct supervision/assist for medications management;Direct supervision/assist for financial management;Assist for transportation;Help with stairs or ramp for entrance;Supervision due to cognitive status;Assistance with cooking/housework   Equipment Recommendations  None recommended by OT    Recommendations for  Other Services      Precautions / Restrictions Precautions Precautions: Fall Restrictions Weight Bearing Restrictions Per Provider Order: No       Mobility Bed Mobility Overal bed mobility: Modified Independent                  Transfers                         Balance Overall balance assessment: Needs assistance Sitting-balance support: No upper extremity supported Sitting balance-Leahy Scale: Normal                                     ADL either performed or assessed with clinical judgement   ADL                                              Extremity/Trunk Assessment              Vision       Perception     Praxis     Communication Communication Communication: No apparent difficulties   Cognition Arousal: Alert Behavior During Therapy: WFL for tasks assessed/performed Cognition: No family/caregiver present to determine baseline, Cognition impaired   Orientation impairments: Time, Situation Awareness: Intellectual awareness impaired, Online awareness impaired Memory impairment (select all impairments): Short-term memory, Working [employer] fast streamer, Conservation officer, historic buildings Attention impairment (select first level of impairment): Sustained attention Executive functioning impairment (select all impairments): Reasoning, Problem solving OT - Cognition Comments: SLUMS administered - scored 16/30, concerning for  dementia level cognitive impairment                 Following commands: Impaired Following commands impaired: Follows one step commands with increased time      Cueing   Cueing Techniques: Verbal cues  Exercises Other Exercises Other Exercises: SLUMS administered, pt scoring 16/30 which is concerning for dementia level cognitive impairment Other Exercises: OT facilitated problem solving for IADL including med mgt and meal prep    Shoulder Instructions       General Comments      Pertinent  Vitals/ Pain       Pain Assessment Pain Assessment: No/denies pain  Home Living                                          Prior Functioning/Environment              Frequency  Min 2X/week        Progress Toward Goals  OT Goals(current goals can now be found in the care plan section)  Progress towards OT goals: Progressing toward goals  Acute Rehab OT Goals OT Goal Formulation: With patient Time For Goal Achievement: 07/09/24 Potential to Achieve Goals: Good  Plan      Co-evaluation                 AM-PAC OT 6 Clicks Daily Activity     Outcome Measure   Help from another person eating meals?: None Help from another person taking care of personal grooming?: None Help from another person toileting, which includes using toliet, bedpan, or urinal?: A Little Help from another person bathing (including washing, rinsing, drying)?: A Little Help from another person to put on and taking off regular upper body clothing?: None Help from another person to put on and taking off regular lower body clothing?: A Little 6 Click Score: 21    End of Session Equipment Utilized During Treatment: Oxygen  OT Visit Diagnosis: History of falling (Z91.81);Other abnormalities of gait and mobility (R26.89);Unsteadiness on feet (R26.81)   Activity Tolerance Patient tolerated treatment well   Patient Left in bed;with call bell/phone within reach   Nurse Communication          Time: 8586-8568 OT Time Calculation (min): 18 min  Charges: OT General Charges $OT Visit: 1 Visit OT Treatments $Therapeutic Activity: 8-22 mins  Warren SAUNDERS., MPH, MS, OTR/L ascom [phone] 06/26/24, 3:19 PM

## 2024-06-26 NOTE — Plan of Care (Signed)

## 2024-06-26 NOTE — Progress Notes (Signed)
 Progress Note   Patient: [name] FMW:[id number] DOB: 04/17/1961 DOA: 06/24/2024     1 DOS: the patient was seen and examined on 06/26/2024   Brief hospital course: Patient with PMH of CAD SP CABG, chronic HFpEF, severe aortic stenosis, HTN, HLD, obesity, COPD, recent GI bleed, alcohol  use disorder presented to the hospital with complaints of chest tightness, shortness of breath. Patient has known history of aortic stenosis and sees cardiology for that.  Was recommended to undergo left heart catheterization for possible evaluation of severity of stenosis for possibility of a TAVR. Seen by cardiology, treated with IV Lasix .      Principal Problem:   Symptomatic severe aortic stenosis with normal ejection fraction Active Problems:   Iron  deficiency anemia   Chest pain   CAD (coronary artery disease)   Acute diastolic CHF (congestive heart failure) (HCC)   Obesity hypoventilation syndrome (HCC)   Hyperlipidemia   Essential hypertension   Benign essential HTN   Bilateral carotid artery stenosis   History of stroke s/p left carotid endarterectomy   Dyslipidemia   Depression   Chronic obstructive pulmonary disease (COPD) (HCC)   Acute on chronic diastolic (congestive) heart failure (HCC)   Assessment and Plan: Moderate to severe aortic stenosis. Acute on chronic diastolic congestive heart failure Patient is a followed by cardiology, treated with IV Lasix .  Condition improving.   Heart cath will be scheduled as outpatient cardiology.    Patient still requiring 2 L oxygen.  Switch to torsemide  20 mg daily today, will give extra dose of 20 mg.  He is also started on spironolactone  12.5 mg daily. Monitor renal function, wean off oxygen.  Most likely can be discharged home tomorrow.     Chest tightness with minimal elevated troponin. CAD SP CABG. HTN. HLD. Troponin 18, chest tightness is secondary to aortic stenosis. Continue to follow.   Recurrent GI bleed secondary to gastric  AVM Anemia with iron  deficiency and folate deficiency No active bleeding.  Hemoglobin has been stable, continue iron  supplement.  Continue folic acid .  Recheck CBC tomorrow.   Depression. Continue home regimen.   HLD. Continue statin.   Overweight Body mass index is 29.16 kg/m.  Placing the patient at high risk of poor outcome.   Alcohol  use. Denies any history of abuse.  Reports that he drinks 2 beers every other day.  No risk for alcohol  withdrawal          Subjective:  Patient feels better, slept better last night.  However, she still required 2 L oxygen.  Physical Exam: Vitals:   06/26/24 0500 06/26/24 0806 06/26/24 1000 06/26/24 1207  BP:  135/77 135/77 116/76  Pulse:  86 86 78  Resp:  16  16  Temp:  97.9 F (36.6 C)  97.9 F (36.6 C)  TempSrc:  Oral  Oral  SpO2:  97%  96%  Weight: 94.1 kg     Height:       General exam: Appears calm and comfortable  Respiratory system: Clear to auscultation. Respiratory effort normal. Cardiovascular system: S1 & S2 heard, RRR. 2/6 systolic murmurs,. No pedal edema. Gastrointestinal system: Abdomen is nondistended, soft and nontender. No organomegaly or masses felt. Normal bowel sounds heard. Central nervous system: Alert and oriented. No focal neurological deficits. Extremities: Symmetric 5 x 5 power. Skin: No rashes, lesions or ulcers Psychiatry: Judgement and insight appear normal. Mood & affect appropriate.    Data Reviewed:  Lab results reviewed.  Family Communication: None  Disposition: Status  is: Inpatient Remains inpatient appropriate because: Severity of disease     Time spent: 35 minutes  Author: Murvin Mana, MD 06/26/2024 2:07 PM  For on call review [email or web address].

## 2024-06-26 NOTE — Progress Notes (Addendum)
 [facility] CLINIC CARDIOLOGY PROGRESS NOTE       Patient ID: [name] MRN: [id number] DOB/AGE: 02/24/1961 63 y.o.  Admit date: 06/24/2024 Referring Physician Dr. Yetta Blanch Primary Physician Osa Geralds, NP Primary Cardiologist Tinnie Maiden, NP Reason for Consultation Aortic Stenosis  HPI: [name] is a 63 y.o. 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 Patient has known severe aortic stenosis. Patient had scheduled LHC to further evaluate coronary arteries and aortic stenosis on 07/02 however patient no showed for scheduled procedure. Cardiology was consulted for further evaluation aortic stenosis.   Interval History: -Patient seen and examined this AM and sitting up in bedside chair eating breakfast. Patient states his SOB has improved compared to yesterday, however not at baseline and denies chest pain.  -Patients BP and HR stable this AM. Overnight Tele showed no significant events.  -Yesterday UOP 1.475L, with stable renal function. -Patient remains on 2L with stable SpO2.  -Recommend patient ambulate today and assess his sxs with exertion. Patient lives alone.    Pertinent Cardiac History (Most recent) -Echo 05/10/2024 1. Left ventricular ejection fraction, by estimation, is 60 to 65%. The left ventricle has normal function. The left ventricle has no regional wall motion abnormalities. The left ventricular internal cavity size was mildly dilated. Left ventricular diastolic parameters are consistent with Grade I diastolic dysfunction (impaired relaxation).   2. Right ventricular systolic function is normal. The right  ventricular size is normal.   3. The mitral valve is normal in structure. No evidence of mitral valve regurgitation.   4. The aortic valve is normal in structure. Aortic valve regurgitation is trivial. Severe aortic valve stenosis.   The aortic valve is normal in structure. Aortic valve regurgitation is trivial. Aortic regurgitation PHT measures 498 msec.  Severe aortic stenosis is present. Aortic valve mean gradient measures 39.0 mmHg. Aortic valve peak gradient measures 66.3 mmHg. Aortic valve area, by VTI measures 0.49 cm.    Review of systems complete and found to be negative unless listed above    Past Medical History:  Diagnosis Date   COPD (chronic obstructive pulmonary disease) (HCC)    CVA (cerebral infarction)    Headache    mild, since stroke 2012   Hypertension    MI (myocardial infarction) (HCC) 05/09/2012   Reading difficulty    pt reports he reads at about a second grade level   Stroke [facility]) 2012   numbness to left hand    Wears dentures    full upper and lower    Past Surgical History:  Procedure Laterality Date   CAROTID ENDARTERECTOMY Left    2012 or 2013   CHOLECYSTECTOMY N/A 07/26/2016   Procedure: LAPAROSCOPIC CHOLECYSTECTOMY;  Surgeon: Charlie FORBES Fell, MD;  Location: [facility] ORS;  Service: General;  Laterality: N/A;   COLONOSCOPY N/A 05/08/2024   Procedure: COLONOSCOPY;  Surgeon: Jinny Carmine, MD;  Location: [facility] ENDOSCOPY;  Service: Endoscopy;  Laterality: N/A;   COLONOSCOPY WITH PROPOFOL  N/A 02/09/2022   Procedure: COLONOSCOPY WITH PROPOFOL ;  Surgeon: Jinny Carmine, MD;  Location: [facility] ENDOSCOPY;  Service: Endoscopy;  Laterality: N/A;   CORONARY ARTERY BYPASS GRAFT  05/10/2012   3 vessel  ENTEROSCOPY N/A 05/16/2023   Procedure: ENTEROSCOPY;  Surgeon: Therisa Bi, MD;  Location: [facility] ENDOSCOPY;  Service: Gastroenterology;  Laterality: N/A;   ENTEROSCOPY N/A 06/01/2023   Procedure: ENTEROSCOPY;  Surgeon: Unk Corinn Skiff, MD;  Location: [facility] ENDOSCOPY;   Service: Gastroenterology;  Laterality: N/A;   ESOPHAGOGASTRODUODENOSCOPY  05/13/2023   Procedure: ESOPHAGOGASTRODUODENOSCOPY (EGD);  Surgeon: Jinny Carmine, MD;  Location: [facility] ENDOSCOPY;  Service: Endoscopy;;   ESOPHAGOGASTRODUODENOSCOPY (EGD) WITH PROPOFOL  N/A 10/28/2022   Procedure: ESOPHAGOGASTRODUODENOSCOPY (EGD) WITH PROPOFOL ;  Surgeon: Onita Elspeth Sharper, DO;  Location: [facility] ENDOSCOPY;  Service: Gastroenterology;  Laterality: N/A;   GIVENS CAPSULE STUDY  05/13/2023   Procedure: GIVENS CAPSULE STUDY;  Surgeon: Jinny Carmine, MD;  Location: [facility] ENDOSCOPY;  Service: Endoscopy;;   GIVENS CAPSULE STUDY  06/01/2023   Procedure: GIVENS CAPSULE STUDY;  Surgeon: Unk Corinn Skiff, MD;  Location: [facility] ENDOSCOPY;  Service: Gastroenterology;;    Medications Prior to Admission  Medication Sig Dispense Refill Last Dose/Taking   albuterol  (VENTOLIN  HFA) 108 (90 Base) MCG/ACT inhaler Inhale 2 puffs into the lungs every 6 (six) hours as needed for wheezing or shortness of breath. 6.7 g 2 Taking As Needed   ascorbic acid  (VITAMIN C ) 500 MG tablet Take 1 tablet (500 mg total) by mouth daily. 30 tablet 2 Past Month   folic acid  (FOLVITE ) 1 MG tablet Take 1 tablet (1 mg total) by mouth daily. 30 tablet 2 Past Month   iron  polysaccharides (NIFEREX) 150 MG capsule Take 1 capsule (150 mg total) by mouth daily. 30 capsule 2 Past Month   isosorbide  mononitrate (IMDUR ) 60 MG 24 hr tablet Take 1 tablet (60 mg total) by mouth daily. 30 tablet 11 Past Month   pravastatin  (PRAVACHOL ) 20 MG tablet Take 1 tablet (20 mg total) by mouth at bedtime. 20 tablet 11 Past Month   Vitamin D , Ergocalciferol , (DRISDOL ) 1.25 MG (50000 UNIT) CAPS capsule Take 1 capsule (50,000 Units total) by mouth every 7 (seven) days. 12 capsule 0 Past Week   pantoprazole  (PROTONIX ) 40 MG tablet Take 1 tablet (40 mg total) by mouth daily. 30 tablet 2    Social History   Socioeconomic History   Marital status: Divorced    Spouse name: Not on  file   Number of children: Not on file   Years of education: Not on file   Highest education level: Not on file  Occupational History   Not on file  Tobacco Use   Smoking status: Former    Current packs/day: 0.00    Types: Cigarettes    Quit date: 42    Years since quitting: 32.5   Smokeless tobacco: Current    Types: Snuff   Tobacco comments:    changed to dip  Vaping Use   Vaping status: Never Used  Substance and Sexual Activity   Alcohol  use: Yes    Alcohol /week: 26.0 standard drinks of alcohol     Types: 26 Cans of beer per week   Drug use: No   Sexual activity: Yes    Birth control/protection: None  Other Topics Concern   Not on file  Social History Narrative   Not on file   Social Drivers of Health   Financial Resource Strain: Not on file  Food Insecurity: No Food Insecurity (06/24/2024)   Hunger Vital Sign    Worried About Running Out of Food in the Last Year: Never true    Ran Out of Food in the Last Year: Never true  Transportation Needs: No Transportation Needs (  06/24/2024)   PRAPARE - Administrator, Civil Service (Medical): No    Lack of Transportation (Non-Medical): No  Physical Activity: Not on file  Stress: Not on file  Social Connections: Socially Isolated (06/24/2024)   Social Connection and Isolation Panel    Frequency of Communication with Friends and Family: Never    Frequency of Social Gatherings with Friends and Family: Never    Attends Religious Services: Never    Database administrator or Organizations: No    Attends Banker Meetings: Never    Marital Status: Divorced  Catering manager Violence: Not At Risk (06/24/2024)   Humiliation, Afraid, Rape, and Kick questionnaire    Fear of Current or Ex-Partner: No    Emotionally Abused: No    Physically Abused: No    Sexually Abused: No    Family History  Problem Relation Age of Onset   Pneumonia Mother      Vitals:   06/25/24 1954 06/25/24 2316 06/26/24 0257 06/26/24  0500  BP: 106/60 (!) 90/47 117/61   Pulse: 87 86 86   Resp: 18 18 18    Temp: 97.9 F (36.6 C) 97.8 F (36.6 C) 98.7 F (37.1 C)   TempSrc:      SpO2: 100% 98% 99%   Weight:    94.1 kg  Height:        PHYSICAL EXAM General: Well appearing elderly male, well nourished, in no acute distress. HEENT: Normocephalic and atraumatic. Neck: No JVD.   Lungs: Normal respiratory effort on 2L.  Diminished breath sounds bilaterally (improving).  Heart: HRRR. Normal S1 and S2, + murmur  Abdomen: Non-distended appearing.  Msk: Normal strength and tone for age. Extremities: Warm and well perfused. No clubbing, cyanosis, +2 pedal edema.  Neuro: Alert and oriented X 3. Psych: Answers questions appropriately.   Labs: Basic Metabolic Panel: Recent Labs    06/25/24 0424 06/26/24 0413  NA 138 137  K 3.6 4.1  CL 95* 95*  CO2 32 32  GLUCOSE 114* 98  BUN 18 18  CREATININE 1.18 1.14  CALCIUM  9.2 9.2   Liver Function Tests: Recent Labs    06/24/24 1108 06/25/24 0424  AST 26 23  ALT 17 14  ALKPHOS 50 49  BILITOT 1.6* 1.1  PROT 8.0 7.8  ALBUMIN 3.8 3.6   No results for input(s): LIPASE, AMYLASE in the last 72 hours. CBC: Recent Labs    06/24/24 1108 06/25/24 0424  WBC 8.4 8.5  NEUTROABS 4.9  --   HGB 10.3* 9.6*  HCT 34.6* 31.1*  MCV 90.1 87.9  PLT 224 227   Cardiac Enzymes: Recent Labs    06/24/24 1108 06/24/24 1305  TROPONINIHS 17 18*   BNP: Recent Labs    06/24/24 1108  BNP 446.7*   D-Dimer: No results for input(s): DDIMER in the last 72 hours. Hemoglobin A1C: No results for input(s): HGBA1C in the last 72 hours. Fasting Lipid Panel: Recent Labs    06/24/24 1305  CHOL 157  HDL 50  LDLCALC 98  TRIG 43  CHOLHDL 3.1   Thyroid Function Tests: No results for input(s): TSH, T4TOTAL, T3FREE, THYROIDAB in the last 72 hours.  Invalid input(s): FREET3 Anemia Panel: No results for input(s): VITAMINB12, FOLATE, FERRITIN, TIBC, IRON ,  RETICCTPCT in the last 72 hours.   Radiology: DG Chest 2 View Result Date: 06/24/2024 CLINICAL DATA:  sob EXAM: CHEST - 2 VIEW COMPARISON:  June 03, 2024 FINDINGS: Biapical pleural thickening. No focal airspace  consolidation, pleural effusion, or pneumothorax. No cardiomegaly. Sternotomy wires and CABG markers. Tortuous aorta with aortic atherosclerosis. No acute fracture or destructive lesions. Multilevel thoracic osteophytosis. Osteopenia. IMPRESSION: No acute cardiopulmonary abnormality. Electronically Signed   By: Rogelia Myers M.D.   On: 06/24/2024 11:54   DG Chest 2 View Result Date: 06/05/2024 CLINICAL DATA:  Follow-up aspiration pneumonia. EXAM: CHEST - 2 VIEW COMPARISON:  05/09/2024. FINDINGS: Stable changes from prior CABG surgery. Cardiac silhouette is top-normal in size. No mediastinal or hilar masses. No convincing adenopathy. Lungs are hyperexpanded. Patchy airspace opacities noted in the left mid to lower lung on the prior chest radiograph have mostly resolved. No new lung abnormalities. No pleural effusion or pneumothorax. Skeletal structures are intact. IMPRESSION: 1. Significant interval improvement in the left lower lobe aspiration pneumonitis/pneumonia. Electronically Signed   By: Alm Parkins M.D.   On: 06/05/2024 09:13    ECHO 05/10/2024 1. Left ventricular ejection fraction, by estimation, is 60 to 65%. The left ventricle has normal function. The left ventricle has no regional wall motion abnormalities. The left ventricular internal cavity size was mildly dilated. Left ventricular diastolic parameters are consistent with Grade I diastolic dysfunction (impaired relaxation).   2. Right ventricular systolic function is normal. The right ventricular size is normal.   3. The mitral valve is normal in structure. No evidence of mitral valve regurgitation.   4. The aortic valve is normal in structure. Aortic valve regurgitation is trivial. Severe aortic valve stenosis.   The aortic  valve is normal in structure. Aortic valve regurgitation is trivial. Aortic regurgitation PHT measures 498 msec.  Severe aortic stenosis is present. Aortic valve mean gradient measures 39.0 mmHg. Aortic valve peak gradient measures 66.3 mmHg. Aortic valve area, by VTI measures 0.49 cm.   TELEMETRY reviewed by me 06/26/2024: sinus rhythm, rate 70s   EKG reviewed by me: sinus rhythm, rate 91 bpm with non specific ST-T wave changes.  Data reviewed by me 06/26/2024: last 24h vitals tele labs imaging I/O hospitalist progress notes.  Principal Problem:   Symptomatic severe aortic stenosis with normal ejection fraction Active Problems:   Hyperlipidemia   Benign essential HTN   Bilateral carotid artery stenosis   History of stroke s/p left carotid endarterectomy   Acute diastolic CHF (congestive heart failure) (HCC)   Essential hypertension   Dyslipidemia   Obesity hypoventilation syndrome (HCC)   Chest pain   CAD (coronary artery disease)   Iron  deficiency anemia   Depression   Chronic obstructive pulmonary disease (COPD) (HCC)   Acute on chronic diastolic (congestive) heart failure (HCC)    ASSESSMENT AND PLAN:  [name] is a 63 y.o. 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 Patient has known severe aortic stenosis. Patient had scheduled LHC to further evaluate coronary arteries and aortic stenosis on 07/02 however patient no showed for scheduled procedure. Cardiology was consulted for further evaluation aortic stenosis.   # Severe Aortic Stenosis Patient with known severe AS. Patient had scheduled outpatient LHC (07/02 with Dr. Katina) to further  evaluate coronary arteries and aortic stenosis, however patient never showed up for procedure. Prior echo from 04/2024 with Aortic valve mean gradient  measures 39.0 mmHg. Aortic valve peak gradient measures 66.3 mmHg. Aortic valve area, by VTI measures 0.49 cm. -Outpatient R/LHC with Dr. Katina to further evaluate aortic stenosis and coronary arteries on 07/16 at 10:30 AM.  -Plan for CT TAVR at [facility] or with [facility] to evaluate TAVR candidacy.  -Recommend social work consult as patient has difficulties with medication management and staying on top of outpatient appointments. Reached out to patient's case manger regarding this.  -PT order placed. Recommend patient ambulate to assess sxs to ensure patient is safe to go home, patient lives home alone. Consider repeating echo if patient does poorly with ambulation.  # Acute on chronic HFpEF Patient presents with worsening SOB and lower extremity edema. Patient reports he hasn't been taking his PO lasix  at home. BNP elevated at 440. CXR with no acute cardiopulmonary disease. Prior echo from 04/2024 with pEF, no RWMA, grade I diastolic dysfunction.  -Continue PO torsemide  20 mg daily. Closely monitor UOP and renal function.  -Continue dapagliflozin  10 mg daily. (Copay $ 0) -Continue Spironolactone  12.5 mg daily. Hold off on up titration for now to avoid over-diuresis in setting of severe aortic stenosis.  -Metoprolol  as stated below.  -Will plan to optimize GDMT (ARB) as BP and renal function allow.    # Coronary artery disease s/p CABG x3 (04/2012) # Hypertension # Hyperlipidemia Patient presents with intermittent chest tightness, SOB worsens with deep breathing and exertion. Trops negative x2. EKG with sinus rhythm, rate 91 bpm with non specific ST-T wave changes. LDL 90, total cholesterol 842. -Plan for outpatient LHC as stated above.  -Continue home Imdur  60 mg daily for antianginal management.  -Continue home pravastatin  20 mg daily. -Continue  metoprolol  succinate 12.5 mg daily.  -Patient not on aspirin  due to history of multiple small bowel AVMs that have been on and off bleeding for years.  Cardiology outpatient follow-up with Tinnie Maiden, NP on 07/15 at 1:30 PM. Outpatient R/LHC with Dr. Katina on 07/16 at 10:30 AM (arrival time at 9 AM).  This patient's plan of care was discussed and created with Dr. Wilburn and he is in agreement.  Signed: Dorene Comfort, PA-C  06/26/2024, 7:27 AM [facility] Cardiology

## 2024-06-27 ENCOUNTER — Other Ambulatory Visit: Payer: Self-pay

## 2024-06-27 DIAGNOSIS — D508 Other iron deficiency anemias: Secondary | ICD-10-CM | POA: Diagnosis not present

## 2024-06-27 DIAGNOSIS — I350 Nonrheumatic aortic (valve) stenosis: Secondary | ICD-10-CM | POA: Diagnosis not present

## 2024-06-27 DIAGNOSIS — I5031 Acute diastolic (congestive) heart failure: Secondary | ICD-10-CM | POA: Diagnosis not present

## 2024-06-27 LAB — BASIC METABOLIC PANEL WITH GFR
Anion gap: 12 (ref 5–15)
BUN: 21 mg/dL (ref 8–23)
CO2: 32 mmol/L (ref 22–32)
Calcium: 9.2 mg/dL (ref 8.9–10.3)
Chloride: 92 mmol/L — ABNORMAL LOW (ref 98–111)
Creatinine, Ser: 1.26 mg/dL — ABNORMAL HIGH (ref 0.61–1.24)
GFR, Estimated: >60 mL/min (ref >60)
Glucose, Bld: 108 mg/dL — ABNORMAL HIGH (ref 70–99)
Potassium: 4 mmol/L (ref 3.5–5.1)
Sodium: 136 mmol/L (ref 135–145)

## 2024-06-27 LAB — CBC
HCT: 32.8 % — ABNORMAL LOW (ref 39.0–52.0)
Hemoglobin: 10.1 g/dL — ABNORMAL LOW (ref 13.0–17.0)
MCH: 26.6 pg (ref 26.0–34.0)
MCHC: 30.8 g/dL (ref 30.0–36.0)
MCV: 86.3 fL (ref 80.0–100.0)
Platelets: 213 K/uL (ref 150–400)
RBC: 3.8 MIL/uL — ABNORMAL LOW (ref 4.22–5.81)
RDW: 15.5 % (ref 11.5–15.5)
WBC: 8.9 K/uL (ref 4.0–10.5)
nRBC: 0 % (ref 0.0–0.2)

## 2024-06-27 LAB — MAGNESIUM: Magnesium: 1.9 mg/dL (ref 1.7–2.4)

## 2024-06-27 MED ORDER — TORSEMIDE 20 MG PO TABS
10.0000 mg | ORAL_TABLET | Freq: Every day | ORAL | Status: DC
  Administered 2024-06-27: 10 mg via ORAL
  Filled 2024-06-27: qty 1

## 2024-06-27 MED ORDER — SPIRONOLACTONE 25 MG PO TABS
12.5000 mg | ORAL_TABLET | Freq: Every day | ORAL | 0 refills | Status: DC
  Filled 2024-06-27: qty 30, 60d supply, fill #0

## 2024-06-27 MED ORDER — TORSEMIDE 10 MG PO TABS
10.0000 mg | ORAL_TABLET | Freq: Every day | ORAL | 0 refills | Status: DC
  Filled 2024-06-27: qty 30, 30d supply, fill #0

## 2024-06-27 MED ORDER — DAPAGLIFLOZIN PROPANEDIOL 10 MG PO TABS
10.0000 mg | ORAL_TABLET | Freq: Every day | ORAL | 0 refills | Status: DC
  Filled 2024-06-27: qty 30, 30d supply, fill #0

## 2024-06-27 MED ORDER — METOPROLOL SUCCINATE ER 25 MG PO TB24
12.5000 mg | ORAL_TABLET | Freq: Every day | ORAL | 0 refills | Status: DC
  Filled 2024-06-27: qty 30, 60d supply, fill #0

## 2024-06-27 NOTE — Plan of Care (Signed)
  Problem: Education: Goal: Knowledge of General Education information will improve Description: Including pain rating scale, medication(s)/side effects and non-pharmacologic comfort measures Outcome: Progressing   Problem: Clinical Measurements: Goal: Ability to maintain clinical measurements within normal limits will improve Outcome: Progressing Goal: Will remain free from infection Outcome: Progressing Goal: Diagnostic test results will improve Outcome: Progressing Goal: Respiratory complications will improve Outcome: Progressing Goal: Cardiovascular complication will be avoided Outcome: Progressing   Problem: Activity: Goal: Risk for activity intolerance will decrease Outcome: Progressing   Problem: Nutrition: Goal: Adequate nutrition will be maintained Outcome: Progressing   Problem: Pain Managment: Goal: General experience of comfort will improve and/or be controlled Outcome: Progressing   Problem: Safety: Goal: Ability to remain free from injury will improve Outcome: Progressing   Problem: Skin Integrity: Goal: Risk for impaired skin integrity will decrease Outcome: Progressing

## 2024-06-27 NOTE — TOC Transition Note (Signed)
 Transition of Care [facility]) - Discharge Note   Patient Details  Name: Yared Barefoot MRN: [id number] Date of Birth: 1961-08-09  [facility] CM/SW Contact:  Armoni Depass C Tony Friscia, RN Phone Number: 06/27/2024, 10:00 AM   Clinical Narrative:    Per message received from Brandi at [facility], patient is active for PT/ OT/ aide.   Spoke with patient regarding discharge today. He stated he drove himself here and will drive home. He was advised [facility] will contact him to schedule his resumption of care.   [place]  from [facility] notified.   TOC signing off.          Patient Goals and CMS Choice            Discharge Placement                       Discharge Plan and Services Additional resources added to the After Visit Summary for                                       Social Drivers of Health (SDOH) Interventions SDOH Screenings   Food Insecurity: No Food Insecurity (06/24/2024)  Housing: Low Risk  (06/24/2024)  Transportation Needs: No Transportation Needs (06/24/2024)  Utilities: Not At Risk (06/24/2024)  Social Connections: Socially Isolated (06/24/2024)  Tobacco Use: High Risk (06/03/2024)   Received from [facility]     Readmission Risk Interventions    05/14/2024    8:44 AM 10/27/2022   11:28 AM  Readmission Risk Prevention Plan  Transportation Screening  Complete  PCP or Specialist Appt within 5-7 Days  Complete  PCP or Specialist Appt within 3-5 Days Complete   Home Care Screening  Complete  Medication Review (RN CM)  Complete  HRI or Home Care Consult Complete   Social Work Consult for Recovery Care Planning/Counseling Complete   Palliative Care Screening Not Applicable   Medication Review Oceanographer) Complete

## 2024-06-27 NOTE — Discharge Summary (Addendum)
 Physician Discharge Summary   Patient: [name] MRN: [id number] DOB: 1961/05/04  Admit date:     06/24/2024  Discharge date: 06/27/24  Discharge Physician: Murvin Mana   PCP: Osa Geralds, NP   Recommendations at discharge:   Follow-up with PCP in 1 week. Follow-up with cardiology as scheduled. Repeat BMP at next office visit.  Discharge Diagnoses: Principal Problem:   Symptomatic severe aortic stenosis with normal ejection fraction Active Problems:   Iron  deficiency anemia   Chest pain   CAD (coronary artery disease)   Acute diastolic CHF (congestive heart failure) (HCC)   Obesity hypoventilation syndrome (HCC)   Hyperlipidemia   Essential hypertension   Benign essential HTN   Bilateral carotid artery stenosis   History of stroke s/p left carotid endarterectomy   Dyslipidemia   Depression   Chronic obstructive pulmonary disease (COPD) (HCC)   Acute on chronic diastolic (congestive) heart failure (HCC)  Resolved Problems:   * No resolved hospital problems. [facility] Course: Patient with PMH of CAD SP CABG, chronic HFpEF, severe aortic stenosis, HTN, HLD, obesity, COPD, recent GI bleed, alcohol  use disorder presented to the hospital with complaints of chest tightness, shortness of breath. Patient has known history of aortic stenosis and sees cardiology for that.  Was recommended to undergo left heart catheterization for possible evaluation of severity of stenosis for possibility of a TAVR. Seen by cardiology, treated with IV Lasix .   Condition improved, oxygenation much improved.  Medically stable for discharge.  Assessment and Plan: Moderate to severe aortic stenosis. Acute on chronic diastolic congestive heart failure Acute kidney injury secondary to diuretics Patient is a followed by cardiology, treated with IV Lasix .  Condition improving.   Heart cath will be scheduled as outpatient cardiology.    Patient still requiring 2 L oxygen without documented  hypoxemia.  Switch to torsemide  20 mg daily today, will give extra dose of 20 mg.  He is also started on spironolactone  12.5 mg daily. Discussed with cardiology, medically stable for discharge.  Cardiology will schedule outpatient workup. Torsemide  reduced to 10 mg as patient has developed acute kidney injury with mild elevation of creatinine. Home oxygen evaluation.      Chest tightness with minimal elevated troponin. CAD SP CABG. HTN. HLD. Troponin 18, chest tightness is secondary to aortic stenosis.    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  Hemoglobin has been stable, continue iron  supplement.  Continue folic acid .    Depression. Continue home regimen.   HLD. Continue statin.   Overweight Body mass index is 29.16 kg/m.  Placing the patient at high risk of poor outcome.   Alcohol  use. Denies any history of abuse.  Reports that he drinks 2 beers every other day.  No risk for alcohol  withdrawal         Consultants: Cardiology Procedures performed: None  Disposition: Home Diet recommendation:  Discharge Diet Orders (From admission, onward)     Start     Ordered   06/27/24 0000  Diet - low sodium heart healthy        06/27/24 0902           Cardiac diet DISCHARGE MEDICATION: Allergies as of 06/27/2024       Reactions   Wellbutrin  [bupropion ]    Paranoia         Medication List     TAKE these medications    albuterol  108 (90 Base) MCG/ACT inhaler Commonly  known as: VENTOLIN  HFA Inhale 2 puffs into the lungs every 6 (six) hours as needed for wheezing or shortness of breath.   ascorbic acid  500 MG tablet Commonly known as: VITAMIN C  Take 1 tablet (500 mg total) by mouth daily.   dapagliflozin  propanediol 10 MG Tabs tablet Commonly known as: FARXIGA  Take 1 tablet (10 mg total) by mouth daily.   folic acid  1 MG tablet Commonly known as: FOLVITE  Take 1 tablet (1 mg  total) by mouth daily.   iron  polysaccharides 150 MG capsule Commonly known as: NIFEREX Take 1 capsule (150 mg total) by mouth daily.   isosorbide  mononitrate 60 MG 24 hr tablet Commonly known as: IMDUR  Take 1 tablet (60 mg total) by mouth daily.   metoprolol  succinate 25 MG 24 hr tablet Commonly known as: TOPROL -XL Take 0.5 tablets (12.5 mg total) by mouth daily.   pantoprazole  40 MG tablet Commonly known as: PROTONIX  Take 1 tablet (40 mg total) by mouth daily.   pravastatin  20 MG tablet Commonly known as: PRAVACHOL  Take 1 tablet (20 mg total) by mouth at bedtime.   spironolactone  25 MG tablet Commonly known as: ALDACTONE  Take 0.5 tablets (12.5 mg total) by mouth daily.   torsemide  10 MG tablet Commonly known as: DEMADEX  Take 1 tablet (10 mg total) by mouth daily. Start taking on: June 28, 2024   Vitamin D  (Ergocalciferol ) 1.25 MG (50000 UNIT) Caps capsule Commonly known as: DRISDOL  Take 1 capsule (50,000 Units total) by mouth every 7 (seven) days.        Follow-up Information     Launie Maiden, Ronal Maxwell, NP. Go in 1 week(s).   Specialty: Nurse Practitioner Why: 07/15 at 1:30 PM Contact information: [address] [place] [address] [phone]         Katina Albright, MD Follow up.   Specialty: Cardiology Why: Outpatient  [facility] scheduled on 07/16. Arrive at 9 AM at [facility]. Contact information: [address] [place] [address] [phone]                Discharge Exam: Filed Weights   06/25/24 0312 06/26/24 0500 06/27/24 0500  Weight: 94.3 kg 94.1 kg 93.1 kg   General exam: Appears calm and comfortable  Respiratory system: Clear to auscultation. Respiratory effort normal. Cardiovascular system: S1 & S2 heard, RRR. 2/6 SM ar RUSB. No pedal edema. Gastrointestinal system: Abdomen is nondistended, soft and nontender. No organomegaly or masses felt. Normal bowel sounds heard. Central nervous system: Alert  and oriented. No focal neurological deficits. Extremities: Symmetric 5 x 5 power. Skin: No rashes, lesions or ulcers Psychiatry: Judgement and insight appear normal. Mood & affect appropriate.    Condition at discharge: good  The results of significant diagnostics from this hospitalization (including imaging, microbiology, ancillary and laboratory) are listed below for reference.   Imaging Studies: DG Chest 2 View Result Date: 06/24/2024 CLINICAL DATA:  sob EXAM: CHEST - 2 VIEW COMPARISON:  June 03, 2024 FINDINGS: Biapical pleural thickening. No focal airspace consolidation, pleural effusion, or pneumothorax. No cardiomegaly. Sternotomy wires and CABG markers. Tortuous aorta with aortic atherosclerosis. No acute fracture or destructive lesions. Multilevel thoracic osteophytosis. Osteopenia. IMPRESSION: No acute cardiopulmonary abnormality. Electronically Signed   By: Rogelia Myers M.D.   On: 06/24/2024 11:54   DG Chest 2 View Result Date: 06/05/2024 CLINICAL DATA:  Follow-up aspiration pneumonia. EXAM: CHEST - 2 VIEW COMPARISON:  05/09/2024. FINDINGS: Stable changes from prior CABG surgery. Cardiac silhouette is top-normal  in size. No mediastinal or hilar masses. No convincing adenopathy. Lungs are hyperexpanded. Patchy airspace opacities noted in the left mid to lower lung on the prior chest radiograph have mostly resolved. No new lung abnormalities. No pleural effusion or pneumothorax. Skeletal structures are intact. IMPRESSION: 1. Significant interval improvement in the left lower lobe aspiration pneumonitis/pneumonia. Electronically Signed   By: Alm Parkins M.D.   On: 06/05/2024 09:13    Microbiology: Results for orders placed or performed during the hospital encounter of 05/06/24  MRSA Next Gen by PCR, Nasal     Status: None   Collection Time: 05/08/24  6:33 PM   Specimen: Nasal Mucosa; Nasal Swab  Result Value Ref Range Status   MRSA by PCR Next Gen NOT DETECTED NOT DETECTED Final     Comment: (NOTE) The GeneXpert MRSA Assay (FDA approved for NASAL specimens only), is one component of a comprehensive MRSA colonization surveillance program. It is not intended to diagnose MRSA infection nor to guide or monitor treatment for MRSA infections. Test performance is not FDA approved in patients less than 44 years old. Performed at [facility], [address] [place], [place] [address]   Resp panel by RT-PCR (RSV, Flu A&B, Covid) Anterior Nasal Swab     Status: None   Collection Time: 05/09/24  1:40 PM   Specimen: Anterior Nasal Swab  Result Value Ref Range Status   SARS Coronavirus 2 by RT PCR NEGATIVE NEGATIVE Final    Comment: (NOTE) SARS-CoV-2 target nucleic acids are NOT DETECTED.  The SARS-CoV-2 RNA is generally detectable in upper respiratory specimens during the acute phase of infection. The lowest concentration of SARS-CoV-2 viral copies this assay can detect is 138 copies/mL. A negative result does not preclude SARS-Cov-2 infection and should not be used as the sole basis for treatment or other patient management decisions. A negative result may occur with  improper specimen collection/handling, submission of specimen other than nasopharyngeal swab, presence of viral mutation(s) within the areas targeted by this assay, and inadequate number of viral copies(<138 copies/mL). A negative result must be combined with clinical observations, patient history, and epidemiological information. The expected result is Negative.  Fact Sheet for Patients:  BloggerCourse.com  Fact Sheet for Healthcare Providers:  SeriousBroker.it  This test is no t yet approved or cleared by the [place]  FDA and  has been authorized for detection and/or diagnosis of SARS-CoV-2 by FDA under an Emergency Use Authorization (EUA). This EUA will remain  in effect (meaning this test can be used) for the duration of the COVID-19  declaration under Section 564(b)(1) of the Act, 21 U.S.C.section 360bbb-3(b)(1), unless the authorization is terminated  or revoked sooner.       Influenza A by PCR NEGATIVE NEGATIVE Final   Influenza B by PCR NEGATIVE NEGATIVE Final    Comment: (NOTE) The Xpert Xpress SARS-CoV-2/FLU/RSV plus assay is intended as an aid in the diagnosis of influenza from Nasopharyngeal swab specimens and should not be used as a sole basis for treatment. Nasal washings and aspirates are unacceptable for Xpert Xpress SARS-CoV-2/FLU/RSV testing.  Fact Sheet for Patients: BloggerCourse.com  Fact Sheet for Healthcare Providers: SeriousBroker.it  This test is not yet approved or cleared by the [place]  FDA and has been authorized for detection and/or diagnosis of SARS-CoV-2 by FDA under an Emergency Use Authorization (EUA). This EUA will remain in effect (meaning this test can be used) for the duration of the COVID-19 declaration under Section 564(b)(1) of the Act, 21  U.S.C. section 360bbb-3(b)(1), unless the authorization is terminated or revoked.     Resp Syncytial Virus by PCR NEGATIVE NEGATIVE Final    Comment: (NOTE) Fact Sheet for Patients: BloggerCourse.com  Fact Sheet for Healthcare Providers: SeriousBroker.it  This test is not yet approved or cleared by the [place]  FDA and has been authorized for detection and/or diagnosis of SARS-CoV-2 by FDA under an Emergency Use Authorization (EUA). This EUA will remain in effect (meaning this test can be used) for the duration of the COVID-19 declaration under Section 564(b)(1) of the Act, 21 U.S.C. section 360bbb-3(b)(1), unless the authorization is terminated or revoked.  Performed at [facility], [address] [place], [place] [address]     Labs: CBC: Recent Labs  Lab 06/24/24 1108 06/25/24 0424 06/27/24 0641   WBC 8.4 8.5 8.9  NEUTROABS 4.9  --   --   HGB 10.3* 9.6* 10.1*  HCT 34.6* 31.1* 32.8*  MCV 90.1 87.9 86.3  PLT 224 227 213   Basic Metabolic Panel: Recent Labs  Lab 06/24/24 1108 06/25/24 0424 06/26/24 0413 06/27/24 0641  NA 138 138 137 136  K 3.9 3.6 4.1 4.0  CL 98 95* 95* 92*  CO2 28 32 32 32  GLUCOSE 104* 114* 98 108*  BUN 15 18 18 21   CREATININE 0.85 1.18 1.14 1.26*  CALCIUM  9.6 9.2 9.2 9.2  MG  --   --   --  1.9   Liver Function Tests: Recent Labs  Lab 06/24/24 1108 06/25/24 0424  AST 26 23  ALT 17 14  ALKPHOS 50 49  BILITOT 1.6* 1.1  PROT 8.0 7.8  ALBUMIN 3.8 3.6   CBG: No results for input(s): GLUCAP in the last 168 hours.  Discharge time spent: greater than 30 minutes.  Signed: Murvin Mana, MD [facility] 06/27/2024

## 2024-06-27 NOTE — Progress Notes (Signed)
 [facility] CLINIC CARDIOLOGY PROGRESS NOTE       Patient ID: [name] MRN: [id number] DOB/AGE: 1961/05/07 63 y.o.  Admit date: 06/24/2024 Referring Physician Dr. Yetta Blanch Primary Physician Osa Geralds, NP Primary Cardiologist Tinnie Maiden, NP Reason for Consultation Aortic Stenosis  HPI: [name] is a 63 y.o. 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 Patient has known severe aortic stenosis. Patient had scheduled LHC to further evaluate coronary arteries and aortic stenosis on 07/02 however patient no showed for scheduled procedure. Cardiology was consulted for further evaluation aortic stenosis.   Interval History: -Patient seen and examined this AM and sitting up on side of hospital bed. Continues to report improvement of SOB. -Patients BP and HR stable this AM. Overnight Tele showed no significant events.  -Yesterday UOP 4.5L, with slightly uptrending renal function. -Patient remains on 2L with stable SpO2.  -Walked around unit 8-9 times yesterday and reports he tolerated this well.   Pertinent Cardiac History (Most recent) -Echo 05/10/2024 1. Left ventricular ejection fraction, by estimation, is 60 to 65%. The left ventricle has normal function. The left ventricle has no regional wall motion abnormalities. The left ventricular internal cavity size was mildly dilated. Left ventricular diastolic parameters are consistent with Grade I diastolic dysfunction (impaired relaxation).   2. Right ventricular systolic function is normal. The right ventricular size is normal.   3. The mitral valve is normal in structure. No  evidence of mitral valve regurgitation.   4. The aortic valve is normal in structure. Aortic valve regurgitation is trivial. Severe aortic valve stenosis.   The aortic valve is normal in structure. Aortic valve regurgitation is trivial. Aortic regurgitation PHT measures 498 msec.  Severe aortic stenosis is present. Aortic valve mean gradient measures 39.0 mmHg. Aortic valve peak gradient measures 66.3 mmHg. Aortic valve area, by VTI measures 0.49 cm.    Review of systems complete and found to be negative unless listed above    Past Medical History:  Diagnosis Date   COPD (chronic obstructive pulmonary disease) (HCC)    CVA (cerebral infarction)    Headache    mild, since stroke 2012   Hypertension    MI (myocardial infarction) (HCC) 05/09/2012   Reading difficulty    pt reports he reads at about a second grade level   Stroke [facility]) 2012   numbness to left hand    Wears dentures    full upper and lower    Past Surgical History:  Procedure Laterality Date   CAROTID ENDARTERECTOMY Left    2012 or 2013   CHOLECYSTECTOMY N/A 07/26/2016   Procedure: LAPAROSCOPIC CHOLECYSTECTOMY;  Surgeon: Charlie FORBES Fell, MD;  Location: [facility] ORS;  Service: General;  Laterality: N/A;   COLONOSCOPY N/A 05/08/2024   Procedure: COLONOSCOPY;  Surgeon: Jinny Carmine, MD;  Location: [facility] ENDOSCOPY;  Service: Endoscopy;  Laterality: N/A;   COLONOSCOPY WITH PROPOFOL  N/A 02/09/2022   Procedure: COLONOSCOPY WITH PROPOFOL ;  Surgeon: Jinny Carmine, MD;  Location: [facility] ENDOSCOPY;  Service: Endoscopy;  Laterality: N/A;   CORONARY ARTERY BYPASS GRAFT  05/10/2012   3 vessel   ENTEROSCOPY N/A 05/16/2023   Procedure: ENTEROSCOPY;  Surgeon: Therisa Bi,  MD;  Location: [facility] ENDOSCOPY;  Service: Gastroenterology;  Laterality: N/A;   ENTEROSCOPY N/A 06/01/2023   Procedure: ENTEROSCOPY;  Surgeon: Unk Corinn Skiff, MD;  Location: [facility] ENDOSCOPY;  Service: Gastroenterology;  Laterality: N/A;   ESOPHAGOGASTRODUODENOSCOPY   05/13/2023   Procedure: ESOPHAGOGASTRODUODENOSCOPY (EGD);  Surgeon: Jinny Carmine, MD;  Location: [facility] ENDOSCOPY;  Service: Endoscopy;;   ESOPHAGOGASTRODUODENOSCOPY (EGD) WITH PROPOFOL  N/A 10/28/2022   Procedure: ESOPHAGOGASTRODUODENOSCOPY (EGD) WITH PROPOFOL ;  Surgeon: Onita Elspeth Sharper, DO;  Location: [facility] ENDOSCOPY;  Service: Gastroenterology;  Laterality: N/A;   GIVENS CAPSULE STUDY  05/13/2023   Procedure: GIVENS CAPSULE STUDY;  Surgeon: Jinny Carmine, MD;  Location: [facility] ENDOSCOPY;  Service: Endoscopy;;   GIVENS CAPSULE STUDY  06/01/2023   Procedure: GIVENS CAPSULE STUDY;  Surgeon: Unk Corinn Skiff, MD;  Location: [facility] ENDOSCOPY;  Service: Gastroenterology;;    Medications Prior to Admission  Medication Sig Dispense Refill Last Dose/Taking   albuterol  (VENTOLIN  HFA) 108 (90 Base) MCG/ACT inhaler Inhale 2 puffs into the lungs every 6 (six) hours as needed for wheezing or shortness of breath. 6.7 g 2 Taking As Needed   ascorbic acid  (VITAMIN C ) 500 MG tablet Take 1 tablet (500 mg total) by mouth daily. 30 tablet 2 Past Month   folic acid  (FOLVITE ) 1 MG tablet Take 1 tablet (1 mg total) by mouth daily. 30 tablet 2 Past Month   iron  polysaccharides (NIFEREX) 150 MG capsule Take 1 capsule (150 mg total) by mouth daily. 30 capsule 2 Past Month   isosorbide  mononitrate (IMDUR ) 60 MG 24 hr tablet Take 1 tablet (60 mg total) by mouth daily. 30 tablet 11 Past Month   pravastatin  (PRAVACHOL ) 20 MG tablet Take 1 tablet (20 mg total) by mouth at bedtime. 20 tablet 11 Past Month   Vitamin D , Ergocalciferol , (DRISDOL ) 1.25 MG (50000 UNIT) CAPS capsule Take 1 capsule (50,000 Units total) by mouth every 7 (seven) days. 12 capsule 0 Past Week   pantoprazole  (PROTONIX ) 40 MG tablet Take 1 tablet (40 mg total) by mouth daily. 30 tablet 2    Social History   Socioeconomic History   Marital status: Divorced    Spouse name: Not on file   Number of children: Not on file   Years of education: Not on file    Highest education level: Not on file  Occupational History   Not on file  Tobacco Use   Smoking status: Former    Current packs/day: 0.00    Types: Cigarettes    Quit date: 36    Years since quitting: 32.5   Smokeless tobacco: Current    Types: Snuff   Tobacco comments:    changed to dip  Vaping Use   Vaping status: Never Used  Substance and Sexual Activity   Alcohol  use: Yes    Alcohol /week: 26.0 standard drinks of alcohol     Types: 26 Cans of beer per week   Drug use: No   Sexual activity: Yes    Birth control/protection: None  Other Topics Concern   Not on file  Social History Narrative   Not on file   Social Drivers of Health   Financial Resource Strain: Not on file  Food Insecurity: No Food Insecurity (06/24/2024)   Hunger Vital Sign    Worried About Running Out of Food in the Last Year: Never true    Ran Out of Food in the Last Year: Never true  Transportation Needs: No Transportation Needs (06/24/2024)   PRAPARE - Transportation    Lack of  Transportation (Medical): No    Lack of Transportation (Non-Medical): No  Physical Activity: Not on file  Stress: Not on file  Social Connections: Socially Isolated (06/24/2024)   Social Connection and Isolation Panel    Frequency of Communication with Friends and Family: Never    Frequency of Social Gatherings with Friends and Family: Never    Attends Religious Services: Never    Database administrator or Organizations: No    Attends Banker Meetings: Never    Marital Status: Divorced  Catering manager Violence: Not At Risk (06/24/2024)   Humiliation, Afraid, Rape, and Kick questionnaire    Fear of Current or Ex-Partner: No    Emotionally Abused: No    Physically Abused: No    Sexually Abused: No    Family History  Problem Relation Age of Onset   Pneumonia Mother      Vitals:   06/26/24 2333 06/27/24 0424 06/27/24 0500 06/27/24 0820  BP: 121/74 105/73  91/69  Pulse: 85 82  83  Resp: 19 19    Temp: 97.8  F (36.6 C) 97.6 F (36.4 C)  98 F (36.7 C)  TempSrc:      SpO2: 99% 99%  100%  Weight:   93.1 kg   Height:        PHYSICAL EXAM General: Well appearing elderly male, well nourished, in no acute distress. HEENT: Normocephalic and atraumatic. Neck: No JVD.   Lungs: Normal respiratory effort on 2L.  Diminished breath sounds bilaterally (improving).  Heart: HRRR. Normal S1 and S2, + murmur  Abdomen: Non-distended appearing.  Msk: Normal strength and tone for age. Extremities: Warm and well perfused. No clubbing, cyanosis, +1 pedal edema.  Neuro: Alert and oriented X 3. Psych: Answers questions appropriately.   Labs: Basic Metabolic Panel: Recent Labs    06/26/24 0413 06/27/24 0641  NA 137 136  K 4.1 4.0  CL 95* 92*  CO2 32 32  GLUCOSE 98 108*  BUN 18 21  CREATININE 1.14 1.26*  CALCIUM  9.2 9.2  MG  --  1.9   Liver Function Tests: Recent Labs    06/24/24 1108 06/25/24 0424  AST 26 23  ALT 17 14  ALKPHOS 50 49  BILITOT 1.6* 1.1  PROT 8.0 7.8  ALBUMIN 3.8 3.6   No results for input(s): LIPASE, AMYLASE in the last 72 hours. CBC: Recent Labs    06/24/24 1108 06/25/24 0424 06/27/24 0641  WBC 8.4 8.5 8.9  NEUTROABS 4.9  --   --   HGB 10.3* 9.6* 10.1*  HCT 34.6* 31.1* 32.8*  MCV 90.1 87.9 86.3  PLT 224 227 213   Cardiac Enzymes: Recent Labs    06/24/24 1108 06/24/24 1305  TROPONINIHS 17 18*   BNP: Recent Labs    06/24/24 1108  BNP 446.7*   D-Dimer: No results for input(s): DDIMER in the last 72 hours. Hemoglobin A1C: No results for input(s): HGBA1C in the last 72 hours. Fasting Lipid Panel: Recent Labs    06/24/24 1305  CHOL 157  HDL 50  LDLCALC 98  TRIG 43  CHOLHDL 3.1   Thyroid Function Tests: No results for input(s): TSH, T4TOTAL, T3FREE, THYROIDAB in the last 72 hours.  Invalid input(s): FREET3 Anemia Panel: No results for input(s): VITAMINB12, FOLATE, FERRITIN, TIBC, IRON , RETICCTPCT in the last 72  hours.   Radiology: DG Chest 2 View Result Date: 06/24/2024 CLINICAL DATA:  sob EXAM: CHEST - 2 VIEW COMPARISON:  June 03, 2024 FINDINGS: Biapical pleural  thickening. No focal airspace consolidation, pleural effusion, or pneumothorax. No cardiomegaly. Sternotomy wires and CABG markers. Tortuous aorta with aortic atherosclerosis. No acute fracture or destructive lesions. Multilevel thoracic osteophytosis. Osteopenia. IMPRESSION: No acute cardiopulmonary abnormality. Electronically Signed   By: Rogelia Myers M.D.   On: 06/24/2024 11:54   DG Chest 2 View Result Date: 06/05/2024 CLINICAL DATA:  Follow-up aspiration pneumonia. EXAM: CHEST - 2 VIEW COMPARISON:  05/09/2024. FINDINGS: Stable changes from prior CABG surgery. Cardiac silhouette is top-normal in size. No mediastinal or hilar masses. No convincing adenopathy. Lungs are hyperexpanded. Patchy airspace opacities noted in the left mid to lower lung on the prior chest radiograph have mostly resolved. No new lung abnormalities. No pleural effusion or pneumothorax. Skeletal structures are intact. IMPRESSION: 1. Significant interval improvement in the left lower lobe aspiration pneumonitis/pneumonia. Electronically Signed   By: Alm Parkins M.D.   On: 06/05/2024 09:13    ECHO 05/10/2024 1. Left ventricular ejection fraction, by estimation, is 60 to 65%. The left ventricle has normal function. The left ventricle has no regional wall motion abnormalities. The left ventricular internal cavity size was mildly dilated. Left ventricular diastolic parameters are consistent with Grade I diastolic dysfunction (impaired relaxation).   2. Right ventricular systolic function is normal. The right ventricular size is normal.   3. The mitral valve is normal in structure. No evidence of mitral valve regurgitation.   4. The aortic valve is normal in structure. Aortic valve regurgitation is trivial. Severe aortic valve stenosis.   The aortic valve is normal in  structure. Aortic valve regurgitation is trivial. Aortic regurgitation PHT measures 498 msec.  Severe aortic stenosis is present. Aortic valve mean gradient measures 39.0 mmHg. Aortic valve peak gradient measures 66.3 mmHg. Aortic valve area, by VTI measures 0.49 cm.   TELEMETRY reviewed by me 06/27/2024: sinus rhythm, rate 80s   EKG reviewed by me: sinus rhythm, rate 91 bpm with non specific ST-T wave changes.  Data reviewed by me 06/27/2024: last 24h vitals tele labs imaging I/O hospitalist progress notes.  Principal Problem:   Symptomatic severe aortic stenosis with normal ejection fraction Active Problems:   Hyperlipidemia   Benign essential HTN   Bilateral carotid artery stenosis   History of stroke s/p left carotid endarterectomy   Acute diastolic CHF (congestive heart failure) (HCC)   Essential hypertension   Dyslipidemia   Obesity hypoventilation syndrome (HCC)   Chest pain   CAD (coronary artery disease)   Iron  deficiency anemia   Depression   Chronic obstructive pulmonary disease (COPD) (HCC)   Acute on chronic diastolic (congestive) heart failure (HCC)    ASSESSMENT AND PLAN:  Matthieu Loftus is a 63 y.o. 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 Patient has known severe aortic stenosis. Patient had scheduled LHC to further evaluate coronary arteries and aortic stenosis on 07/02 however patient no showed for scheduled procedure. Cardiology was consulted for further evaluation aortic stenosis.   # Severe Aortic Stenosis Patient with known severe AS. Patient had scheduled outpatient LHC (07/02 with Dr. Katina) to further evaluate coronary  arteries and aortic stenosis, however patient never showed up for procedure. Prior echo from 04/2024 with  Aortic valve mean gradient measures 39.0 mmHg. Aortic valve peak gradient measures 66.3 mmHg. Aortic valve area, by VTI measures 0.49 cm. -Outpatient R/LHC with Dr. Katina to further evaluate aortic stenosis and coronary arteries on 07/16 at 10:30 AM.  -Plan for CT [facility]AVR at [facility] or with [facility] to evaluate TAVR candidacy which we will set up outpatient.  -Recommend social work consult as patient has difficulties with medication management and staying on top of outpatient appointments. Reached out to patient's case manger regarding this.   # Acute on chronic HFpEF Patient presents with worsening SOB and lower extremity edema. Hasn't been taking his PO lasix  at home. BNP elevated at 440. CXR with no acute cardiopulmonary disease. Prior echo from 04/2024 with pEF, no RWMA, grade I diastolic dysfunction.  -Decrease PO torsemide  to 10 mg daily. Closely monitor UOP and renal function.  -Continue dapagliflozin  10 mg daily. (Copay $ 0) -Continue Spironolactone  12.5 mg daily. Hold off on up titration for now to avoid over-diuresis in setting of severe aortic stenosis.  -Metoprolol  as stated below.  -Will plan to optimize GDMT (ARB) as BP and renal function allow.    # Coronary artery disease s/p CABG x3 (04/2012) # Hypertension # Hyperlipidemia Patient with intermittent chest tightness, SOB worsens with deep breathing and exertion. Trops negative x2. EKG with sinus rhythm, rate 91 bpm with non specific ST-T wave changes. LDL 90, total cholesterol 842. -Plan for outpatient LHC as stated above.  -Continue home Imdur  60 mg daily for antianginal management.  -Continue home pravastatin  20 mg daily. -Continue metoprolol  succinate 12.5 mg daily.  -Patient not on aspirin  due to history of multiple small bowel AVMs that have been intermittently bleeding for years.  Ok for discharge today from a cardiac  perspective. Cardiology outpatient follow-up with Tinnie Maiden, NP on 07/15 at 1:30 PM. Outpatient R/LHC with Dr. Katina on 07/16 at 10:30 AM (arrival time at 9 AM). Patient provided with appointment paperwork today.  This patient's plan of care was discussed and created with Dr. Wilburn and he is in agreement.  Signed: Danita Bloch, PA-C  06/27/2024, 8:37 AM [facility] Cardiology

## 2024-06-27 NOTE — Progress Notes (Signed)
 Discharge instructions given to patient, questions answered. Pharmacy delivered new medications to patient at bedside. Patient driving self home via his private vehicle. No IV in place to remove.

## 2024-07-02 ENCOUNTER — Encounter
Admission: AD | Disposition: A | Discharge status: Home / Self Care | Payer: Self-pay | Source: Home / Self Care | Attending: Hospitalist

## 2024-07-02 ENCOUNTER — Other Ambulatory Visit: Payer: Self-pay

## 2024-07-02 ENCOUNTER — Observation Stay
Admission: AD | Admit: 2024-07-02 | Discharge: 2024-07-03 | Disposition: A | Discharge status: Home / Self Care | Attending: Student | Admitting: Student

## 2024-07-02 ENCOUNTER — Encounter: Payer: Self-pay | Admitting: Anesthesiology

## 2024-07-02 DIAGNOSIS — I130 Hypertensive heart and chronic kidney disease with heart failure and stage 1 through stage 4 chronic kidney disease, or unspecified chronic kidney disease: Secondary | ICD-10-CM | POA: Insufficient documentation

## 2024-07-02 DIAGNOSIS — N1831 Chronic kidney disease, stage 3a: Secondary | ICD-10-CM | POA: Diagnosis not present

## 2024-07-02 DIAGNOSIS — Z79899 Other long term (current) drug therapy: Secondary | ICD-10-CM | POA: Insufficient documentation

## 2024-07-02 DIAGNOSIS — Z951 Presence of aortocoronary bypass graft: Secondary | ICD-10-CM | POA: Diagnosis not present

## 2024-07-02 DIAGNOSIS — F0390 Unspecified dementia without behavioral disturbance: Secondary | ICD-10-CM | POA: Diagnosis not present

## 2024-07-02 DIAGNOSIS — Z87891 Personal history of nicotine dependence: Secondary | ICD-10-CM | POA: Insufficient documentation

## 2024-07-02 DIAGNOSIS — I2510 Atherosclerotic heart disease of native coronary artery without angina pectoris: Secondary | ICD-10-CM | POA: Insufficient documentation

## 2024-07-02 DIAGNOSIS — I200 Unstable angina: Secondary | ICD-10-CM

## 2024-07-02 DIAGNOSIS — Z8673 Personal history of transient ischemic attack (TIA), and cerebral infarction without residual deficits: Secondary | ICD-10-CM | POA: Insufficient documentation

## 2024-07-02 DIAGNOSIS — R079 Chest pain, unspecified: Secondary | ICD-10-CM | POA: Diagnosis present

## 2024-07-02 DIAGNOSIS — J449 Chronic obstructive pulmonary disease, unspecified: Secondary | ICD-10-CM | POA: Insufficient documentation

## 2024-07-02 DIAGNOSIS — I350 Nonrheumatic aortic (valve) stenosis: Secondary | ICD-10-CM

## 2024-07-02 DIAGNOSIS — I5032 Chronic diastolic (congestive) heart failure: Secondary | ICD-10-CM | POA: Insufficient documentation

## 2024-07-02 DIAGNOSIS — K219 Gastro-esophageal reflux disease without esophagitis: Secondary | ICD-10-CM | POA: Diagnosis not present

## 2024-07-02 DIAGNOSIS — E785 Hyperlipidemia, unspecified: Secondary | ICD-10-CM | POA: Insufficient documentation

## 2024-07-02 HISTORY — PX: CORONARY ULTRASOUND/IVUS: CATH118244

## 2024-07-02 HISTORY — PX: LEFT HEART CATH AND CORS/GRAFTS ANGIOGRAPHY: CATH118250

## 2024-07-02 HISTORY — PX: CORONARY STENT INTERVENTION: CATH118234

## 2024-07-02 LAB — POCT ACTIVATED CLOTTING TIME: Activated Clotting Time: 274 s

## 2024-07-02 SURGERY — LEFT HEART CATH AND CORS/GRAFTS ANGIOGRAPHY
Anesthesia: Moderate Sedation

## 2024-07-02 MED ORDER — LIDOCAINE HCL 1 % IJ SOLN
INTRAMUSCULAR | Status: AC
Start: 2024-07-02 — End: 2024-07-02
  Filled 2024-07-02: qty 20

## 2024-07-02 MED ORDER — HEPARIN SODIUM (PORCINE) 1000 UNIT/ML IJ SOLN
INTRAMUSCULAR | Status: DC | PRN
Start: 2024-07-02 — End: 2024-07-02
  Administered 2024-07-02: 5000 [IU] via INTRAVENOUS
  Administered 2024-07-02: 3000 [IU] via INTRAVENOUS
  Administered 2024-07-02: 8000 [IU] via INTRAVENOUS

## 2024-07-02 MED ORDER — SODIUM CHLORIDE 0.9 % WEIGHT BASED INFUSION: 1.0000 mL/kg/h | INTRAVENOUS | Status: DC

## 2024-07-02 MED ORDER — ASPIRIN 81 MG PO CHEW
81.0000 mg | CHEWABLE_TABLET | ORAL | Status: AC
  Administered 2024-07-02: 81 mg via ORAL
  Filled 2024-07-02: qty 1

## 2024-07-02 MED ORDER — MIDAZOLAM HCL 2 MG/2ML IJ SOLN
INTRAMUSCULAR | Status: AC
  Filled 2024-07-02: qty 2

## 2024-07-02 MED ORDER — SODIUM CHLORIDE 0.9 % WEIGHT BASED INFUSION
1.0000 mL/kg/h | INTRAVENOUS | Status: AC
  Administered 2024-07-02: 1 mL/kg/h via INTRAVENOUS

## 2024-07-02 MED ORDER — ISOSORBIDE MONONITRATE ER 60 MG PO TB24
60.0000 mg | ORAL_TABLET | Freq: Every day | ORAL | Status: DC
  Administered 2024-07-02 – 2024-07-03 (×2): 60 mg via ORAL
  Filled 2024-07-02 (×2): qty 1

## 2024-07-02 MED ORDER — ACETAMINOPHEN 325 MG PO TABS: 650.0000 mg | ORAL_TABLET | ORAL | Status: DC | PRN

## 2024-07-02 MED ORDER — NITROGLYCERIN 1 MG/10 ML FOR IR/CATH LAB
INTRA_ARTERIAL | Status: AC
  Filled 2024-07-02: qty 10

## 2024-07-02 MED ORDER — SPIRONOLACTONE 12.5 MG HALF TABLET
12.5000 mg | ORAL_TABLET | Freq: Every day | ORAL | Status: DC
  Administered 2024-07-02 – 2024-07-03 (×2): 12.5 mg via ORAL
  Filled 2024-07-02 (×2): qty 1

## 2024-07-02 MED ORDER — SODIUM CHLORIDE 0.9% FLUSH
3.0000 mL | Freq: Two times a day (BID) | INTRAVENOUS | Status: DC
  Administered 2024-07-02 – 2024-07-03 (×2): 3 mL via INTRAVENOUS

## 2024-07-02 MED ORDER — ONDANSETRON HCL 4 MG/2ML IJ SOLN: 4.0000 mg | Freq: Four times a day (QID) | INTRAMUSCULAR | Status: DC | PRN

## 2024-07-02 MED ORDER — PRAVASTATIN SODIUM 20 MG PO TABS
20.0000 mg | ORAL_TABLET | Freq: Every day | ORAL | Status: DC
  Administered 2024-07-02: 20 mg via ORAL
  Filled 2024-07-02: qty 1

## 2024-07-02 MED ORDER — SODIUM CHLORIDE 0.9% FLUSH: 3.0000 mL | Freq: Two times a day (BID) | INTRAVENOUS | Status: DC

## 2024-07-02 MED ORDER — HEPARIN (PORCINE) IN NACL 1000-0.9 UT/500ML-% IV SOLN
INTRAVENOUS | Status: DC | PRN
  Administered 2024-07-02: 1000 mL

## 2024-07-02 MED ORDER — SODIUM CHLORIDE 0.9% FLUSH
3.0000 mL | INTRAVENOUS | Status: DC | PRN
Start: 2024-07-02 — End: 2024-07-03

## 2024-07-02 MED ORDER — CLOPIDOGREL BISULFATE 75 MG PO TABS
75.0000 mg | ORAL_TABLET | Freq: Every day | ORAL | Status: DC
  Administered 2024-07-03: 75 mg via ORAL
  Filled 2024-07-02: qty 1

## 2024-07-02 MED ORDER — CLOPIDOGREL BISULFATE 75 MG PO TABS: 75.0000 mg | ORAL_TABLET | Freq: Every day | ORAL | 0 refills | Status: DC

## 2024-07-02 MED ORDER — MIDAZOLAM HCL 2 MG/2ML IJ SOLN
INTRAMUSCULAR | Status: DC | PRN
  Administered 2024-07-02: 1 mg via INTRAVENOUS

## 2024-07-02 MED ORDER — SODIUM CHLORIDE 0.9% FLUSH: 3.0000 mL | INTRAVENOUS | Status: DC | PRN

## 2024-07-02 MED ORDER — METOPROLOL SUCCINATE ER 25 MG PO TB24
12.5000 mg | ORAL_TABLET | Freq: Every day | ORAL | Status: DC
  Administered 2024-07-02 – 2024-07-03 (×2): 12.5 mg via ORAL
  Filled 2024-07-02 (×2): qty 1

## 2024-07-02 MED ORDER — CLOPIDOGREL BISULFATE 75 MG PO TABS
ORAL_TABLET | ORAL | Status: DC | PRN
Start: 2024-07-02 — End: 2024-07-02
  Administered 2024-07-02: 600 mg via ORAL

## 2024-07-02 MED ORDER — PANTOPRAZOLE SODIUM 40 MG PO TBEC
40.0000 mg | DELAYED_RELEASE_TABLET | Freq: Every day | ORAL | Status: DC
  Administered 2024-07-02 – 2024-07-03 (×2): 40 mg via ORAL
  Filled 2024-07-02 (×2): qty 1

## 2024-07-02 MED ORDER — ALBUTEROL SULFATE (2.5 MG/3ML) 0.083% IN NEBU: 3.0000 mL | INHALATION_SOLUTION | Freq: Four times a day (QID) | RESPIRATORY_TRACT | Status: DC | PRN

## 2024-07-02 MED ORDER — FENTANYL CITRATE (PF) 100 MCG/2ML IJ SOLN
INTRAMUSCULAR | Status: DC | PRN
  Administered 2024-07-02: 50 ug via INTRAVENOUS

## 2024-07-02 MED ORDER — HEPARIN SODIUM (PORCINE) 1000 UNIT/ML IJ SOLN
INTRAMUSCULAR | Status: AC
  Filled 2024-07-02: qty 10

## 2024-07-02 MED ORDER — HYDRALAZINE HCL 20 MG/ML IJ SOLN: 10.0000 mg | INTRAMUSCULAR | Status: AC | PRN

## 2024-07-02 MED ORDER — TORSEMIDE 20 MG PO TABS
10.0000 mg | ORAL_TABLET | Freq: Every day | ORAL | Status: DC
  Administered 2024-07-02 – 2024-07-03 (×2): 10 mg via ORAL
  Filled 2024-07-02 (×2): qty 1

## 2024-07-02 MED ORDER — FENTANYL CITRATE (PF) 100 MCG/2ML IJ SOLN
INTRAMUSCULAR | Status: AC
  Filled 2024-07-02: qty 2

## 2024-07-02 MED ORDER — LIDOCAINE HCL (PF) 1 % IJ SOLN
INTRAMUSCULAR | Status: DC | PRN
  Administered 2024-07-02: 2 mL

## 2024-07-02 MED ORDER — SODIUM CHLORIDE 0.9 % IV SOLN
250.0000 mL | INTRAVENOUS | Status: DC | PRN
Start: 2024-07-02 — End: 2024-07-03

## 2024-07-02 MED ORDER — ENSURE PLUS HIGH PROTEIN PO LIQD
237.0000 mL | Freq: Two times a day (BID) | ORAL | Status: DC
  Administered 2024-07-03: 237 mL via ORAL

## 2024-07-02 MED ORDER — VERAPAMIL HCL 2.5 MG/ML IV SOLN
INTRAVENOUS | Status: DC | PRN
  Administered 2024-07-02: 2.5 mg via INTRA_ARTERIAL

## 2024-07-02 MED ORDER — SODIUM CHLORIDE 0.9 % WEIGHT BASED INFUSION
3.0000 mL/kg/h | INTRAVENOUS | Status: AC
  Administered 2024-07-02: 3 mL/kg/h via INTRAVENOUS

## 2024-07-02 MED ORDER — ASPIRIN 81 MG PO CHEW
81.0000 mg | CHEWABLE_TABLET | Freq: Every day | ORAL | Status: DC
  Administered 2024-07-03: 81 mg via ORAL
  Filled 2024-07-02: qty 1

## 2024-07-02 MED ORDER — CLOPIDOGREL BISULFATE 75 MG PO TABS
ORAL_TABLET | ORAL | Status: AC
  Filled 2024-07-02: qty 8

## 2024-07-02 MED ORDER — VERAPAMIL HCL 2.5 MG/ML IV SOLN
INTRAVENOUS | Status: AC
  Filled 2024-07-02: qty 2

## 2024-07-02 MED ORDER — SODIUM CHLORIDE 0.9 % IV SOLN: 250.0000 mL | INTRAVENOUS | Status: DC | PRN

## 2024-07-02 MED ORDER — IOHEXOL 300 MG/ML  SOLN
INTRAMUSCULAR | Status: DC | PRN
  Administered 2024-07-02: 160 mL

## 2024-07-02 SURGICAL SUPPLY — 17 items
BALLOON ~~LOC~~ TREK NEO RX 4.0X12 (BALLOONS) ×1 IMPLANT
CATH 5FR JL3.5 JR4 ANG PIG MP (CATHETERS) ×1 IMPLANT
CATH EAGLE EYE PLAT IMAGING (CATHETERS) ×1 IMPLANT
CATH INFINITI 5 FR IM (CATHETERS) ×1 IMPLANT
CATH VISTA GUIDE 6FR JR4 ECOPK (CATHETERS) ×1 IMPLANT
DEVICE RAD TR BAND REGULAR (VASCULAR PRODUCTS) ×1 IMPLANT
DEVICE SPIDERFX EMB PROT 5MM (WIRE) ×1 IMPLANT
DRAPE BRACHIAL (DRAPES) ×1 IMPLANT
GLIDESHEATH SLEND A-KIT 6F 22G (SHEATH) ×1 IMPLANT
GUIDEWIRE INQWIRE 1.5J.035X260 (WIRE) ×1 IMPLANT
KIT ENCORE 26 ADVANTAGE (KITS) ×1 IMPLANT
PACK CARDIAC CATH (CUSTOM PROCEDURE TRAY) ×2 IMPLANT
SET ATX-X65L (MISCELLANEOUS) ×1 IMPLANT
STATION PROTECTION PRESSURIZED (MISCELLANEOUS) ×1 IMPLANT
STENT ONYX FRONTIER 4.0X15 (Permanent Stent) ×1 IMPLANT
TUBING CIL FLEX 10 FLL-RA (TUBING) ×1 IMPLANT
WIRE ASAHI PROWATER 180CM (WIRE) ×1 IMPLANT

## 2024-07-02 NOTE — H&P (Signed)
 History and Physical    [name] FMW:[id number] DOB: 06/10/1961 DOA: 07/02/2024  DOS: the patient was seen and examined on 07/02/2024  PCP: Osa Geralds, NP   Patient coming from: Home  I have personally briefly reviewed patient's old medical records in [facility]  Chief Complaint: Admitted for elective LHC today  HPI: [name] is a pleasant 63 y.o. male with medical history significant for  CAD S/P CABG, chronic HFpEF, severe aortic stenosis, HTN, HLD, obesity, COPD, recent GI bleed, alcohol  use disorder presented to [facility] for elective left heart catheterization.  Patient was recommended to undergo left heart catheterization for possible evaluation of severity of his stenosis for possibility of TAVR while he was in the hospital between 06/24/2024 and 06/27/2024.  He had a follow-up at outpatient clinic and he was electively brought in for left heart catheterization.  After left heart catheterization he underwent stent placement and hospitalist service was consulted for evaluation for admission for overnight observation.  ED Course: Left heart catheterization done 7/16 with the following findings. 1.  Severe native three-vessel CAD 2.  LIMA to LAD and SVG to OM widely patent 3.  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  Successful direct stenting of the distal SVG to PDA with distal protection with a 4.0 x 15 mm drug-eluting stent with intravascular ultrasound guidance 5.  Aspirin  and clopidogrel  for at least 6 months followed by clopidogrel  indefinitely 6.  Continue workup for aortic valve replacement  Review of Systems:  ROS  All other systems negative except as noted in the HPI.  Past Medical History:  Diagnosis Date   COPD (chronic obstructive pulmonary disease) (HCC)    CVA (cerebral infarction)    Headache    mild, since stroke 2012   Hypertension    MI (myocardial infarction) (HCC) 05/09/2012   Reading difficulty    pt reports he reads at about  a second grade level   Stroke [facility]) 2012   numbness to left hand    Wears dentures    full upper and lower    Past Surgical History:  Procedure Laterality Date   CAROTID ENDARTERECTOMY Left    2012 or 2013   CHOLECYSTECTOMY N/A 07/26/2016   Procedure: LAPAROSCOPIC CHOLECYSTECTOMY;  Surgeon: Charlie FORBES Fell, MD;  Location: [facility] ORS;  Service: General;  Laterality: N/A;   COLONOSCOPY N/A 05/08/2024   Procedure: COLONOSCOPY;  Surgeon: Jinny Carmine, MD;  Location: [facility] ENDOSCOPY;  Service: Endoscopy;  Laterality: N/A;   COLONOSCOPY WITH PROPOFOL  N/A 02/09/2022   Procedure: COLONOSCOPY WITH PROPOFOL ;  Surgeon: Jinny Carmine, MD;  Location: [facility] ENDOSCOPY;  Service: Endoscopy;  Laterality: N/A;   CORONARY ARTERY BYPASS GRAFT  05/10/2012   3 vessel   ENTEROSCOPY N/A 05/16/2023   Procedure: ENTEROSCOPY;  Surgeon: Therisa Bi, MD;  Location: [facility] ENDOSCOPY;  Service: Gastroenterology;  Laterality: N/A;   ENTEROSCOPY N/A 06/01/2023   Procedure: ENTEROSCOPY;  Surgeon: Unk Corinn Skiff, MD;  Location: [facility] ENDOSCOPY;  Service: Gastroenterology;  Laterality: N/A;   ESOPHAGOGASTRODUODENOSCOPY  05/13/2023   Procedure: ESOPHAGOGASTRODUODENOSCOPY (EGD);  Surgeon: Jinny Carmine, MD;  Location: [facility] ENDOSCOPY;  Service: Endoscopy;;   ESOPHAGOGASTRODUODENOSCOPY (EGD) WITH PROPOFOL  N/A 10/28/2022   Procedure: ESOPHAGOGASTRODUODENOSCOPY (EGD) WITH PROPOFOL ;  Surgeon: Onita Elspeth Sharper, DO;  Location: [facility] ENDOSCOPY;  Service: Gastroenterology;  Laterality: N/A;   GIVENS CAPSULE STUDY  05/13/2023   Procedure: GIVENS CAPSULE STUDY;  Surgeon: Jinny Carmine, MD;  Location: [facility] ENDOSCOPY;  Service: Endoscopy;;   GIVENS CAPSULE  STUDY  06/01/2023   Procedure: GIVENS CAPSULE STUDY;  Surgeon: Unk Corinn Skiff, MD;  Location: [facility] ENDOSCOPY;  Service: Gastroenterology;;     reports that he quit smoking about 32 years ago. His smoking use included cigarettes. His smokeless tobacco use includes snuff. He reports  current alcohol  use of about 26.0 standard drinks of alcohol  per week. He reports that he does not use drugs.  Allergies  Allergen Reactions   Wellbutrin  [Bupropion ]     Paranoia     Family History  Problem Relation Age of Onset   Pneumonia Mother     Prior to Admission medications   Medication Sig Start Date End Date Taking? Authorizing Provider  albuterol  (VENTOLIN  HFA) 108 (90 Base) MCG/ACT inhaler Inhale 2 puffs into the lungs every 6 (six) hours as needed for wheezing or shortness of breath. 05/14/24  Yes Von Bellis, MD  ascorbic acid  (VITAMIN C ) 500 MG tablet Take 1 tablet (500 mg total) by mouth daily. 05/15/24 08/13/24 Yes Von Bellis, MD  dapagliflozin  propanediol (FARXIGA ) 10 MG TABS tablet Take 1 tablet (10 mg total) by mouth daily. 06/27/24  Yes Laurita Pillion, MD  folic acid  (FOLVITE ) 1 MG tablet Take 1 tablet (1 mg total) by mouth daily. 05/15/24 08/13/24 Yes Von Bellis, MD  iron  polysaccharides (NIFEREX) 150 MG capsule Take 1 capsule (150 mg total) by mouth daily. 05/15/24 08/13/24 Yes Von Bellis, MD  isosorbide  mononitrate (IMDUR ) 60 MG 24 hr tablet Take 1 tablet (60 mg total) by mouth daily. 05/15/24 05/15/25 Yes Von Bellis, MD  metoprolol  succinate (TOPROL -XL) 25 MG 24 hr tablet Take 0.5 tablets (12.5 mg total) by mouth daily. 06/27/24  Yes Laurita Pillion, MD  pantoprazole  (PROTONIX ) 40 MG tablet Take 1 tablet (40 mg total) by mouth daily. 05/14/24 08/12/24 Yes Von Bellis, MD  pravastatin  (PRAVACHOL ) 20 MG tablet Take 1 tablet (20 mg total) by mouth at bedtime. 05/14/24 05/14/25 Yes Von Bellis, MD  spironolactone  (ALDACTONE ) 25 MG tablet Take 0.5 tablets (12.5 mg total) by mouth daily. 06/27/24  Yes Laurita Pillion, MD  torsemide  (DEMADEX ) 10 MG tablet Take 1 tablet (10 mg total) by mouth daily. 06/28/24  Yes Laurita Pillion, MD  Vitamin D , Ergocalciferol , (DRISDOL ) 1.25 MG (50000 UNIT) CAPS capsule Take 1 capsule (50,000 Units total) by mouth every 7 (seven) days. Patient not  taking: Reported on 06/27/2024 05/15/24 08/13/24  Von Bellis, MD    Physical Exam: Vitals:   07/02/24 1430 07/02/24 1445 07/02/24 1500 07/02/24 1515  BP: 121/70 122/69 (!) 140/81 126/67  Pulse: (!) 104 (!) 105 (!) 111 (!) 105  Resp: 15 19 (!) 21 (!) 21  Temp:    (!) 97.2 F (36.2 C)  TempSrc:    Temporal  SpO2: 99% 96% 98% 98%  Weight:      Height:        Physical Exam   Constitutional: Alert, awake, calm, comfortable HEENT: Neck supple Respiratory: Clear to auscultation B/L, no wheezing, no rales.  Cardiovascular: Regular rate and rhythm, no murmurs / rubs / gallops. No extremity edema. 2+ pedal pulses. No carotid bruits.  Abdomen: Soft, no tenderness, Bowel sounds positive.  Musculoskeletal: no clubbing / cyanosis. Good ROM, no contractures. Normal muscle tone.  Skin: no rashes, lesions, ulcers. Neurologic: CN 2-12 grossly intact. Sensation intact, No focal deficit identified Psychiatric: Alert and oriented x 3. Normal mood.    Labs on Admission: I have personally reviewed following labs and imaging studies  CBC: Recent Labs  Lab 06/27/24 0641  WBC 8.9  HGB 10.1*  HCT 32.8*  MCV 86.3  PLT 213   Basic Metabolic Panel: Recent Labs  Lab 06/26/24 0413 06/27/24 0641  NA 137 136  K 4.1 4.0  CL 95* 92*  CO2 32 32  GLUCOSE 98 108*  BUN 18 21  CREATININE 1.14 1.26*  CALCIUM  9.2 9.2  MG  --  1.9   GFR: Estimated Creatinine Clearance: 70.1 mL/min (A) (by C-G formula based on SCr of 1.26 mg/dL (H)). Liver Function Tests: No results for input(s): AST, ALT, ALKPHOS, BILITOT, PROT, ALBUMIN in the last 168 hours. No results for input(s): LIPASE, AMYLASE in the last 168 hours. No results for input(s): AMMONIA in the last 168 hours. Coagulation Profile: No results for input(s): INR, PROTIME in the last 168 hours. Cardiac Enzymes: No results for input(s): CKTOTAL, CKMB, CKMBINDEX, TROPONINI, TROPONINIHS in the last 168 hours. BNP (last  3 results) Recent Labs    05/08/24 1915 06/24/24 1108  BNP 614.9* 446.7*     Urine analysis:    Component Value Date/Time   COLORURINE YELLOW (A) 06/03/2021 2055   APPEARANCEUR CLOUDY (A) 06/03/2021 2055   APPEARANCEUR Clear 09/09/2014 1708   LABSPEC 1.012 06/03/2021 2055   LABSPEC 1.011 09/09/2014 1708   PHURINE 6.0 06/03/2021 2055   GLUCOSEU 50 (A) 06/03/2021 2055   GLUCOSEU Negative 09/09/2014 1708   HGBUR SMALL (A) 06/03/2021 2055   BILIRUBINUR NEGATIVE 06/03/2021 2055   BILIRUBINUR Negative 09/09/2014 1708   KETONESUR 5 (A) 06/03/2021 2055   PROTEINUR 100 (A) 06/03/2021 2055   NITRITE NEGATIVE 06/03/2021 2055   LEUKOCYTESUR NEGATIVE 06/03/2021 2055   LEUKOCYTESUR Negative 09/09/2014 1708    Radiological Exams on Admission: I have personally reviewed images CARDIAC CATHETERIZATION Result Date: 07/02/2024   Prox LAD to Mid LAD lesion is 90% stenosed.   Ost LM to Mid LM lesion is 50% stenosed.   Ost Cx to Prox Cx lesion is 90% stenosed.   Ost RCA to Prox RCA lesion is 100% stenosed.   Mid Graft to Dist Graft lesion is 95% stenosed.   Recommend uninterrupted dual antiplatelet therapy with Aspirin  81mg  daily and Clopidogrel  75mg  daily for a minimum of 6 months (stable ischemic heart disease-Class I recommendation). 1.  Severe native three-vessel CAD 2.  LIMA to LAD and SVG to OM widely patent 3.  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  Successful direct stenting of the distal SVG to PDA with distal protection with a 4.0 x 15 mm drug-eluting stent with intravascular ultrasound guidance 5.  Aspirin  and clopidogrel  for at least 6 months followed by clopidogrel  indefinitely 6.  Continue workup for aortic valve replacement    EKG: My personal interpretation of EKG shows: Sinus rhythm at 97 bpm    Assessment/Plan Principal Problem:   Chest pain Active Problems:   Coronary artery disease    Assessment and Plan: 63 year old W/PMH of CAD S/P CABG, chronic HFpEF,  severe aortic stenosis, HTN, HLD, obesity, COPD, recent GI bleed, alcohol  use disorder presented to [facility] electively for left heart catheterization.  Now he is s/p a left heart catheterization s/p successful direct stenting of the distal SVG to PDA.  He has been admitted for observation post cardiac intervention.  1.  CAD s/p CABG, now s/p successful direct stenting of distal SVG to PDA - Follow recommendation by cardiologist to continue aspirin  and Plavix  for 28-month and Plavix  afterwards. - Monitor overnight in the hospital. - Social work/case manager consult for  medication and safe discharge  2.  HTN, HLD - Continue home medications metoprolol , isosorbide , pravastatin   3.  Congestive heart failure/severe aortic stenosis - Continue medications metoprolol , isosorbide  - Demadex  and spironolactone  starting tomorrow  4.  GERD continue Protonix        DVT prophylaxis: Lovenox  Code Status: Full Code Family Communication: None available Disposition Plan: Home Consults called: Cardiology Admission status: Observation, Telemetry bed   Nena Rebel, MD [facility] 07/02/2024, 3:20 PM

## 2024-07-03 ENCOUNTER — Other Ambulatory Visit: Payer: Self-pay

## 2024-07-03 DIAGNOSIS — R079 Chest pain, unspecified: Secondary | ICD-10-CM | POA: Diagnosis not present

## 2024-07-03 DIAGNOSIS — I259 Chronic ischemic heart disease, unspecified: Secondary | ICD-10-CM

## 2024-07-03 LAB — CBC
HCT: 31.9 % — ABNORMAL LOW (ref 39.0–52.0)
Hemoglobin: 9.8 g/dL — ABNORMAL LOW (ref 13.0–17.0)
MCH: 26.7 pg (ref 26.0–34.0)
MCHC: 30.7 g/dL (ref 30.0–36.0)
MCV: 86.9 fL (ref 80.0–100.0)
Platelets: 254 K/uL (ref 150–400)
RBC: 3.67 MIL/uL — ABNORMAL LOW (ref 4.22–5.81)
RDW: 15.5 % (ref 11.5–15.5)
WBC: 8.4 K/uL (ref 4.0–10.5)
nRBC: 0 % (ref 0.0–0.2)

## 2024-07-03 LAB — BASIC METABOLIC PANEL WITH GFR
Anion gap: 9 (ref 5–15)
BUN: 15 mg/dL (ref 8–23)
CO2: 29 mmol/L (ref 22–32)
Calcium: 8.9 mg/dL (ref 8.9–10.3)
Chloride: 98 mmol/L (ref 98–111)
Creatinine, Ser: 1.2 mg/dL (ref 0.61–1.24)
GFR, Estimated: >60 mL/min (ref >60)
Glucose, Bld: 145 mg/dL — ABNORMAL HIGH (ref 70–99)
Potassium: 4.1 mmol/L (ref 3.5–5.1)
Sodium: 136 mmol/L (ref 135–145)

## 2024-07-03 MED ORDER — TORSEMIDE 10 MG PO TABS
10.0000 mg | ORAL_TABLET | Freq: Every day | ORAL | 11 refills | Status: DC
  Filled 2024-07-03: qty 30, 30d supply, fill #0

## 2024-07-03 MED ORDER — PRAVASTATIN SODIUM 20 MG PO TABS
20.0000 mg | ORAL_TABLET | Freq: Every day | ORAL | 11 refills | Status: DC
  Filled 2024-07-03: qty 30, 30d supply, fill #0

## 2024-07-03 MED ORDER — IPRATROPIUM-ALBUTEROL 0.5-2.5 (3) MG/3ML IN SOLN
3.0000 mL | Freq: Four times a day (QID) | RESPIRATORY_TRACT | Status: DC | PRN
  Administered 2024-07-03: 3 mL via RESPIRATORY_TRACT
  Filled 2024-07-03: qty 3

## 2024-07-03 MED ORDER — DAPAGLIFLOZIN PROPANEDIOL 10 MG PO TABS
10.0000 mg | ORAL_TABLET | Freq: Every day | ORAL | 11 refills | Status: DC
  Filled 2024-07-03: qty 30, 30d supply, fill #0

## 2024-07-03 MED ORDER — FOLIC ACID 1 MG PO TABS
1.0000 mg | ORAL_TABLET | Freq: Every day | ORAL | 2 refills | Status: DC
  Filled 2024-07-03: qty 30, 30d supply, fill #0

## 2024-07-03 MED ORDER — PANTOPRAZOLE SODIUM 40 MG PO TBEC
40.0000 mg | DELAYED_RELEASE_TABLET | Freq: Every day | ORAL | 2 refills | Status: DC
  Filled 2024-07-03: qty 30, 30d supply, fill #0

## 2024-07-03 MED ORDER — METOPROLOL SUCCINATE ER 25 MG PO TB24
12.5000 mg | ORAL_TABLET | Freq: Every day | ORAL | 11 refills | Status: DC
  Filled 2024-07-03: qty 30, 60d supply, fill #0

## 2024-07-03 MED ORDER — ASPIRIN 81 MG PO TBEC
81.0000 mg | DELAYED_RELEASE_TABLET | Freq: Every day | ORAL | 5 refills | Status: DC
  Filled 2024-07-03: qty 30, 30d supply, fill #0

## 2024-07-03 MED ORDER — CLOPIDOGREL BISULFATE 75 MG PO TABS
75.0000 mg | ORAL_TABLET | Freq: Every day | ORAL | 11 refills | Status: DC
  Filled 2024-07-03: qty 30, 30d supply, fill #0

## 2024-07-03 MED ORDER — ALBUTEROL SULFATE HFA 108 (90 BASE) MCG/ACT IN AERS
2.0000 | INHALATION_SPRAY | Freq: Four times a day (QID) | RESPIRATORY_TRACT | 2 refills | Status: DC | PRN
  Filled 2024-07-03: qty 6.7, 30d supply, fill #0

## 2024-07-03 MED ORDER — DAPAGLIFLOZIN PROPANEDIOL 10 MG PO TABS
10.0000 mg | ORAL_TABLET | Freq: Every day | ORAL | Status: DC
  Administered 2024-07-03: 10 mg via ORAL
  Filled 2024-07-03 (×2): qty 1

## 2024-07-03 MED ORDER — LOSARTAN POTASSIUM 25 MG PO TABS
12.5000 mg | ORAL_TABLET | Freq: Every day | ORAL | 11 refills | Status: DC
  Filled 2024-07-03: qty 30, 60d supply, fill #0

## 2024-07-03 MED ORDER — POLYSACCHARIDE IRON COMPLEX 150 MG PO CAPS
150.0000 mg | ORAL_CAPSULE | Freq: Every day | ORAL | 2 refills | Status: DC
  Filled 2024-07-03: qty 30, 30d supply, fill #0

## 2024-07-03 MED ORDER — ISOSORBIDE MONONITRATE ER 60 MG PO TB24
60.0000 mg | ORAL_TABLET | Freq: Every day | ORAL | 11 refills | Status: DC
  Filled 2024-07-03: qty 30, 30d supply, fill #0

## 2024-07-03 MED ORDER — SPIRONOLACTONE 25 MG PO TABS
12.5000 mg | ORAL_TABLET | Freq: Every day | ORAL | 11 refills | Status: DC
  Filled 2024-07-03: qty 30, 60d supply, fill #0

## 2024-07-03 MED ORDER — LOSARTAN POTASSIUM 25 MG PO TABS
12.5000 mg | ORAL_TABLET | Freq: Every day | ORAL | Status: DC
  Administered 2024-07-03: 12.5 mg via ORAL
  Filled 2024-07-03: qty 1

## 2024-07-03 MED ORDER — ASCORBIC ACID 500 MG PO TABS
500.0000 mg | ORAL_TABLET | Freq: Every day | ORAL | 2 refills | Status: DC
  Filled 2024-07-03: qty 30, 30d supply, fill #0

## 2024-07-03 NOTE — Consult Note (Addendum)
 [facility] CARDIOLOGY CONSULT NOTE       Patient ID: [name] MRN: [id number] DOB/AGE: October 04, 1961 63 y.o.  Admit date: 07/02/2024 Referring Physician Dr. Roann Primary Physician Osa Geralds, NP Primary Cardiologist Tinnie Maiden, NP/ Dr. Katina Reason for Consultation outpatient LHC s/p stent to graft PDA  HPI: Arsalan Brisbin is a 63 y.o. 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 Cardiology was consulted for further management.   Patient presented for an outpatient LHC with Dr. Katina and received stent to PDA graft.  Patient tolerated procedure well. Work up today notable for sodium 136, potassium 4.1, creatinine 1.2, hemoglobin 9.8, platelets 254.  EKG with normal sinus rhythm with rate 89 bpm with nonspecific ST-T wave changes.   At the time of my evaluation this AM, patient was resting comfortable in hospital bed.  Patient states he feels okay this morning and is eager to go home.  Patient's left radial incision sites is clean, dry without active bleeding or significant tenderness.  Patient states he feels short of breath and wheezing this morning and reports is unsure if he felt this way yesterday.  Patient states he has not ambulated yet and recommended that he ambulates today.  Patient denies any chest pain, palpitations, lower extremity swelling, orthopnea, lightheadedness or dizziness.  Patient appears euvolemic.  Patient states overall he feels okay.  Pertinent Cardiac History (Most recent) LHC (07/03/24 with Dr. Katina)   Prox LAD to Mid LAD  lesion is 90% stenosed.   Ost LM to Mid LM lesion is 50% stenosed.   Ost Cx to Prox Cx lesion is 90% stenosed.   Ost RCA to Prox RCA lesion is 100% stenosed.   Mid Graft to Dist Graft lesion is 95% stenosed.   Recommend uninterrupted dual antiplatelet therapy with Aspirin  81mg  daily and Clopidogrel  75mg  daily for a minimum of 6 months (stable ischemic heart disease-Class I recommendation).   1.  Severe native three-vessel CAD 2.  LIMA to LAD and SVG to OM widely patent 3.  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  Successful direct stenting of the distal SVG to PDA with distal protection with a 4.0 x 15 mm drug-eluting stent with intravascular ultrasound guidance 5.  Aspirin  and clopidogrel  for at least 6 months followed by clopidogrel  indefinitely 6.  Continue workup for aortic valve replacement  Review of systems complete and found to be negative unless listed above    Past Medical History:  Diagnosis Date   COPD (chronic obstructive pulmonary disease) (HCC)    CVA (cerebral infarction)    Headache    mild, since stroke 2012   Hypertension    MI (myocardial infarction) (HCC) 05/09/2012   Reading difficulty    pt reports he reads at about a second grade level   Stroke [facility]) 2012   numbness to left hand    Wears dentures    full upper and lower    Past Surgical History:  Procedure Laterality Date   CAROTID ENDARTERECTOMY Left    2012 or 2013   CHOLECYSTECTOMY N/A 07/26/2016  Procedure: LAPAROSCOPIC CHOLECYSTECTOMY;  Surgeon: Charlie FORBES Fell, MD;  Location: [facility] ORS;  Service: General;  Laterality: N/A;   COLONOSCOPY N/A 05/08/2024   Procedure: COLONOSCOPY;  Surgeon: Jinny Carmine, MD;  Location: [facility] ENDOSCOPY;  Service: Endoscopy;  Laterality: N/A;   COLONOSCOPY WITH PROPOFOL  N/A 02/09/2022   Procedure: COLONOSCOPY WITH PROPOFOL ;  Surgeon: Jinny Carmine, MD;  Location: [facility] ENDOSCOPY;  Service: Endoscopy;  Laterality: N/A;   CORONARY ARTERY BYPASS GRAFT   05/10/2012   3 vessel   CORONARY STENT INTERVENTION N/A 07/02/2024   Procedure: CORONARY STENT INTERVENTION;  Surgeon: Katina Albright, MD;  Location: [facility] INVASIVE CV LAB;  Service: Cardiovascular;  Laterality: N/A;   CORONARY ULTRASOUND/IVUS N/A 07/02/2024   Procedure: Coronary Ultrasound/IVUS;  Surgeon: Katina Albright, MD;  Location: [facility] INVASIVE CV LAB;  Service: Cardiovascular;  Laterality: N/A;   ENTEROSCOPY N/A 05/16/2023   Procedure: ENTEROSCOPY;  Surgeon: Therisa Bi, MD;  Location: [facility] ENDOSCOPY;  Service: Gastroenterology;  Laterality: N/A;   ENTEROSCOPY N/A 06/01/2023   Procedure: ENTEROSCOPY;  Surgeon: Unk Corinn Skiff, MD;  Location: [facility] ENDOSCOPY;  Service: Gastroenterology;  Laterality: N/A;   ESOPHAGOGASTRODUODENOSCOPY  05/13/2023   Procedure: ESOPHAGOGASTRODUODENOSCOPY (EGD);  Surgeon: Jinny Carmine, MD;  Location: [facility] ENDOSCOPY;  Service: Endoscopy;;   ESOPHAGOGASTRODUODENOSCOPY (EGD) WITH PROPOFOL  N/A 10/28/2022   Procedure: ESOPHAGOGASTRODUODENOSCOPY (EGD) WITH PROPOFOL ;  Surgeon: Onita Elspeth Sharper, DO;  Location: [facility] ENDOSCOPY;  Service: Gastroenterology;  Laterality: N/A;   GIVENS CAPSULE STUDY  05/13/2023   Procedure: GIVENS CAPSULE STUDY;  Surgeon: Jinny Carmine, MD;  Location: [facility] ENDOSCOPY;  Service: Endoscopy;;   GIVENS CAPSULE STUDY  06/01/2023   Procedure: GIVENS CAPSULE STUDY;  Surgeon: Unk Corinn Skiff, MD;  Location: [facility] ENDOSCOPY;  Service: Gastroenterology;;   LEFT HEART CATH AND CORS/GRAFTS ANGIOGRAPHY N/A 07/02/2024   Procedure: LEFT HEART CATH AND CORS/GRAFTS ANGIOGRAPHY;  Surgeon: Katina Albright, MD;  Location: [facility] INVASIVE CV LAB;  Service: Cardiovascular;  Laterality: N/A;    Medications Prior to Admission  Medication Sig Dispense Refill Last Dose/Taking   albuterol  (VENTOLIN  HFA) 108 (90 Base) MCG/ACT inhaler Inhale 2 puffs into the lungs every 6 (six) hours as needed for wheezing or shortness of breath. 6.7 g 2 Past Week   ascorbic acid  (VITAMIN C ) 500  MG tablet Take 1 tablet (500 mg total) by mouth daily. 30 tablet 2 Taking   dapagliflozin  propanediol (FARXIGA ) 10 MG TABS tablet Take 1 tablet (10 mg total) by mouth daily. 30 tablet 0 Taking   folic acid  (FOLVITE ) 1 MG tablet Take 1 tablet (1 mg total) by mouth daily. 30 tablet 2 Taking   iron  polysaccharides (NIFEREX) 150 MG capsule Take 1 capsule (150 mg total) by mouth daily. 30 capsule 2 Taking   isosorbide  mononitrate (IMDUR ) 60 MG 24 hr tablet Take 1 tablet (60 mg total) by mouth daily. 30 tablet 11 Taking   metoprolol  succinate (TOPROL -XL) 25 MG 24 hr tablet Take 0.5 tablets (12.5 mg total) by mouth daily. 30 tablet 0 Taking   pantoprazole  (PROTONIX ) 40 MG tablet Take 1 tablet (40 mg total) by mouth daily. 30 tablet 2 Taking   pravastatin  (PRAVACHOL ) 20 MG tablet Take 1 tablet (20 mg total) by mouth at bedtime. 20 tablet 11 Taking   spironolactone  (ALDACTONE ) 25 MG tablet Take 0.5 tablets (12.5 mg total) by mouth daily. 30 tablet 0 Taking   torsemide  (DEMADEX ) 10 MG tablet Take 1 tablet (10 mg total) by mouth daily. 30 tablet 0 Taking   Vitamin D , Ergocalciferol , (DRISDOL ) 1.25 MG (  50000 UNIT) CAPS capsule Take 1 capsule (50,000 Units total) by mouth every 7 (seven) days. (Patient not taking: Reported on 06/27/2024) 12 capsule 0 Not Taking   Social History   Socioeconomic History   Marital status: Divorced    Spouse name: Not on file   Number of children: Not on file   Years of education: Not on file   Highest education level: Not on file  Occupational History   Not on file  Tobacco Use   Smoking status: Former    Current packs/day: 0.00    Types: Cigarettes    Quit date: 48    Years since quitting: 32.5   Smokeless tobacco: Current    Types: Snuff   Tobacco comments:    changed to dip  Vaping Use   Vaping status: Never Used  Substance and Sexual Activity   Alcohol  use: Yes    Alcohol /week: 26.0 standard drinks of alcohol     Types: 26 Cans of beer per week   Drug use:  No   Sexual activity: Yes    Birth control/protection: None  Other Topics Concern   Not on file  Social History Narrative   Not on file   Social Drivers of Health   Financial Resource Strain: Not on file  Food Insecurity: No Food Insecurity (07/02/2024)   Hunger Vital Sign    Worried About Running Out of Food in the Last Year: Never true    Ran Out of Food in the Last Year: Never true  Transportation Needs: No Transportation Needs (07/02/2024)   PRAPARE - Administrator, Civil Service (Medical): No    Lack of Transportation (Non-Medical): No  Physical Activity: Not on file  Stress: Not on file  Social Connections: Socially Isolated (07/02/2024)   Social Connection and Isolation Panel    Frequency of Communication with Friends and Family: Once a week    Frequency of Social Gatherings with Friends and Family: Once a week    Attends Religious Services: Never    Database administrator or Organizations: No    Attends Banker Meetings: Never    Marital Status: Divorced  Catering manager Violence: Not At Risk (07/02/2024)   Humiliation, Afraid, Rape, and Kick questionnaire    Fear of Current or Ex-Partner: No    Emotionally Abused: No    Physically Abused: No    Sexually Abused: No    Family History  Problem Relation Age of Onset   Pneumonia Mother      Vitals:   07/02/24 2100 07/03/24 0346 07/03/24 0745 07/03/24 0851  BP: 105/72 126/72 (!) 140/90   Pulse:  91 90   Resp:  18 19   Temp:  98 F (36.7 C) 98.4 F (36.9 C)   TempSrc:   Oral   SpO2:  98% 99% 99%  Weight:      Height:        PHYSICAL EXAM General: Chronically ill-appearing male, well nourished, in no acute distress. HEENT: Normocephalic and atraumatic. Neck: No JVD.   Lungs: Normal respiratory effort on room air.  Diminished breath sounds bilaterally Heart: HRRR. Normal S1 and S2, + systolic murmur  Abdomen: Non-distended appearing.  Msk: Normal strength and tone for  age. Extremities: Warm and well perfused. No clubbing, cyanosis, edema.  Neuro: Alert and oriented X 3. Psych: Answers questions appropriately.   Labs: Basic Metabolic Panel: Recent Labs    07/03/24 0401  NA 136  K 4.1  CL 98  CO2 29  GLUCOSE 145*  BUN 15  CREATININE 1.20  CALCIUM  8.9   Liver Function Tests: No results for input(s): AST, ALT, ALKPHOS, BILITOT, PROT, ALBUMIN in the last 72 hours. No results for input(s): LIPASE, AMYLASE in the last 72 hours. CBC: Recent Labs    07/03/24 0401  WBC 8.4  HGB 9.8*  HCT 31.9*  MCV 86.9  PLT 254   Cardiac Enzymes: No results for input(s): CKTOTAL, CKMB, CKMBINDEX, TROPONINIHS in the last 72 hours. BNP: No results for input(s): BNP in the last 72 hours. D-Dimer: No results for input(s): DDIMER in the last 72 hours. Hemoglobin A1C: No results for input(s): HGBA1C in the last 72 hours. Fasting Lipid Panel: No results for input(s): CHOL, HDL, LDLCALC, TRIG, CHOLHDL, LDLDIRECT in the last 72 hours. Thyroid Function Tests: No results for input(s): TSH, T4TOTAL, T3FREE, THYROIDAB in the last 72 hours.  Invalid input(s): FREET3 Anemia Panel: No results for input(s): VITAMINB12, FOLATE, FERRITIN, TIBC, IRON , RETICCTPCT in the last 72 hours.   Radiology: CARDIAC CATHETERIZATION Result Date: 07/02/2024   Prox LAD to Mid LAD lesion is 90% stenosed.   Ost LM to Mid LM lesion is 50% stenosed.   Ost Cx to Prox Cx lesion is 90% stenosed.   Ost RCA to Prox RCA lesion is 100% stenosed.   Mid Graft to Dist Graft lesion is 95% stenosed.   Recommend uninterrupted dual antiplatelet therapy with Aspirin  81mg  daily and Clopidogrel  75mg  daily for a minimum of 6 months (stable ischemic heart disease-Class I recommendation). 1.  Severe native three-vessel CAD 2.  LIMA to LAD and SVG to OM widely patent 3.  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  Successful direct  stenting of the distal SVG to PDA with distal protection with a 4.0 x 15 mm drug-eluting stent with intravascular ultrasound guidance 5.  Aspirin  and clopidogrel  for at least 6 months followed by clopidogrel  indefinitely 6.  Continue workup for aortic valve replacement   DG Chest 2 View Result Date: 06/24/2024 CLINICAL DATA:  sob EXAM: CHEST - 2 VIEW COMPARISON:  June 03, 2024 FINDINGS: Biapical pleural thickening. No focal airspace consolidation, pleural effusion, or pneumothorax. No cardiomegaly. Sternotomy wires and CABG markers. Tortuous aorta with aortic atherosclerosis. No acute fracture or destructive lesions. Multilevel thoracic osteophytosis. Osteopenia. IMPRESSION: No acute cardiopulmonary abnormality. Electronically Signed   By: Rogelia Myers M.D.   On: 06/24/2024 11:54   DG Chest 2 View Result Date: 06/05/2024 CLINICAL DATA:  Follow-up aspiration pneumonia. EXAM: CHEST - 2 VIEW COMPARISON:  05/09/2024. FINDINGS: Stable changes from prior CABG surgery. Cardiac silhouette is top-normal in size. No mediastinal or hilar masses. No convincing adenopathy. Lungs are hyperexpanded. Patchy airspace opacities noted in the left mid to lower lung on the prior chest radiograph have mostly resolved. No new lung abnormalities. No pleural effusion or pneumothorax. Skeletal structures are intact. IMPRESSION: 1. Significant interval improvement in the left lower lobe aspiration pneumonitis/pneumonia. Electronically Signed   By: Alm Parkins M.D.   On: 06/05/2024 09:13    ECHO 05/10/24 1. Left ventricular ejection fraction, by estimation, is 60 to 65%. The left ventricle has normal function. The left ventricle has no regional wall motion abnormalities. The left ventricular internal cavity size was mildly dilated. Left ventricular  diastolic parameters are consistent with Grade I diastolic dysfunction (impaired relaxation).   2. Right ventricular systolic function is normal. The right ventricular size is  normal.   3. The mitral valve is  normal in structure. No evidence of mitral valve regurgitation.   4. The aortic valve is normal in structure. Aortic valve regurgitation is trivial. Severe aortic valve stenosis.   TELEMETRY reviewed by me 07/03/2024: Sinus rhythm, rate 80s.  EKG reviewed by me:  normal sinus rhythm with rate 89 bpm with nonspecific ST-T wave changes.   Data reviewed by me 07/03/2024: last 24h vitals tele labs imaging I/O ED provider note, admission H&P.  Principal Problem:   Chest pain Active Problems:   Coronary artery disease    ASSESSMENT AND PLAN:  [name] is a 63 y.o. 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 Cardiology was consulted for further management.   # Coronary artery disease s/p CABG x3 (04/2012) s/p stent PDA graft (07/02/24) # Hypertension # Hyperlipidemia Patient without chest pain.  S/p Outpatient LHC with stent to PDA graft with Dr. Katina on 07/16. Patient tolerated procedure well. Left radial incision site is clean and dry with no evidence of significant swelling, bruising, or active bleeding. Hgb and Cr are stable this morning  after catheterization.  -Continue aspirin  81 mg, Plavix  75 mg daily. Aspirin  and clopidogrel  for at least 6 months followed by clopidogrel  indefinitely -Continue home Imdur  60 mg daily for antianginal management.  -Continue home pravastatin  20 mg daily. -Continue home metoprolol  succinate 12.5 mg daily.  -Ordered low dose losartan  12.5 mg daily. Will titrate as BP allows.  # Severe Aortic  Stenosis Patient with known severe AS. Prior echo from 04/2024 with Aortic valve mean gradient measures 39.0 mmHg. Aortic valve peak gradient measures 66.3 mmHg. Aortic valve area, by VTI measures 0.49 cm. -Patient has follow-up with Dr. Katina on 07/30 at 2:30 PM to further AS work-up. -Plan for CT TAVR to  evaluate TAVR candidacy.  -Recommend social work consult as patient has difficulties with medication management and staying on top of outpatient appointments.  -PT order placed. Recommend patient ambulate to assess sxs to ensure patient is safe to go home.    # Chronic HFpEF Patient appears euvolemic. Echo from 04/2024 with pEF, no RWMA, grade I diastolic dysfunction.  -Continue home PO torsemide  10 mg daily. Closely monitor UOP and renal function.  -Resume home dapagliflozin  10 mg daily. (Copay $ 0) -Continue home spironolactone  12.5 mg daily. Hold off on up titration for now to avoid over-diuresis in setting of severe aortic stenosis.  -Metoprolol , and as stated below.   Ok for discharge today from a cardiac perspective after patient ambulates. Follow-up with Dr. Katina on 07/30 at 2:30 PM   This patient's plan of care was discussed and created with Dr. Florencio and he is in agreement.  Signed: Dorene Comfort, PA-C  07/03/2024, 9:20 AM [facility]

## 2024-07-03 NOTE — Plan of Care (Signed)
  Problem: Education: Goal: Understanding of CV disease, CV risk reduction, and recovery process will improve Outcome: Progressing Goal: Individualized Educational Video(s) Outcome: Progressing   Problem: Activity: Goal: Ability to return to baseline activity level will improve Outcome: Progressing   Problem: Cardiovascular: Goal: Ability to achieve and maintain adequate cardiovascular perfusion will improve Outcome: Progressing Goal: Vascular access site(s) Level 0-1 will be maintained Outcome: Progressing   Problem: Health Behavior/Discharge Planning: Goal: Ability to safely manage health-related needs after discharge will improve Outcome: Progressing   Problem: Education: Goal: Understanding of cardiac disease, CV risk reduction, and recovery process will improve Outcome: Progressing Goal: Individualized Educational Video(s) Outcome: Progressing   Problem: Activity: Goal: Ability to tolerate increased activity will improve Outcome: Progressing   Problem: Cardiac: Goal: Ability to achieve and maintain adequate cardiovascular perfusion will improve Outcome: Progressing   Problem: Health Behavior/Discharge Planning: Goal: Ability to safely manage health-related needs after discharge will improve Outcome: Progressing   Problem: Education: Goal: Knowledge of General Education information will improve Description: Including pain rating scale, medication(s)/side effects and non-pharmacologic comfort measures Outcome: Progressing   Problem: Health Behavior/Discharge Planning: Goal: Ability to manage health-related needs will improve Outcome: Progressing   Problem: Clinical Measurements: Goal: Ability to maintain clinical measurements within normal limits will improve Outcome: Progressing Goal: Will remain free from infection Outcome: Progressing Goal: Diagnostic test results will improve Outcome: Progressing Goal: Respiratory complications will improve Outcome:  Progressing Goal: Cardiovascular complication will be avoided Outcome: Progressing   Problem: Activity: Goal: Risk for activity intolerance will decrease Outcome: Progressing   Problem: Nutrition: Goal: Adequate nutrition will be maintained Outcome: Progressing   Problem: Coping: Goal: Level of anxiety will decrease Outcome: Progressing   Problem: Elimination: Goal: Will not experience complications related to bowel motility Outcome: Progressing Goal: Will not experience complications related to urinary retention Outcome: Progressing   Problem: Pain Managment: Goal: General experience of comfort will improve and/or be controlled Outcome: Progressing   Problem: Safety: Goal: Ability to remain free from injury will improve Outcome: Progressing   Problem: Skin Integrity: Goal: Risk for impaired skin integrity will decrease Outcome: Progressing

## 2024-07-03 NOTE — TOC Transition Note (Signed)
 Transition of Care [facility]) - Discharge Note   Patient Details  Name: [name] MRN: [id number] Date of Birth: 1961-01-10  [facility] CM/SW Contact:  Rashawn Rayman C Lamaj Metoyer, RN Phone Number: 07/03/2024, 11:28 AM   Clinical Narrative:    Spoke with patient regarding discharge home today. Patient will be driving himself home.   TOC signing off.           Patient Goals and CMS Choice            Discharge Placement                       Discharge Plan and Services Additional resources added to the After Visit Summary for                                       Social Drivers of Health (SDOH) Interventions SDOH Screenings   Food Insecurity: No Food Insecurity (07/02/2024)  Housing: Low Risk  (07/02/2024)  Transportation Needs: No Transportation Needs (07/02/2024)  Utilities: Not At Risk (07/02/2024)  Social Connections: Socially Isolated (07/02/2024)  Tobacco Use: High Risk (07/02/2024)     Readmission Risk Interventions    05/14/2024    8:44 AM 10/27/2022   11:28 AM  Readmission Risk Prevention Plan  Transportation Screening  Complete  PCP or Specialist Appt within 5-7 Days  Complete  PCP or Specialist Appt within 3-5 Days Complete   Home Care Screening  Complete  Medication Review (RN CM)  Complete  HRI or Home Care Consult Complete   Social Work Consult for Recovery Care Planning/Counseling Complete   Palliative Care Screening Not Applicable   Medication Review Oceanographer) Complete

## 2024-07-03 NOTE — Care Management Obs Status (Signed)
 MEDICARE OBSERVATION STATUS NOTIFICATION   Patient Details  Name: [name] MRN: [id number] Date of Birth: [birth year]   Medicare Observation Status Notification Given:  Yes    Tomasa JAYSON Childes, RN 07/03/2024, 11:27 AM

## 2024-07-03 NOTE — Care Management CC44 (Signed)
 Condition Code 44 Documentation Completed  Patient Details  Name: [name] MRN: [id number] Date of Birth: 05-10-1961   Condition Code 44 given:  Yes Patient signature on Condition Code 44 notice:  Yes Documentation of 2 MD's agreement:  Yes Code 44 added to claim:  Yes    Tomasa JAYSON Childes, RN 07/03/2024, 11:27 AM

## 2024-07-03 NOTE — Discharge Summary (Signed)
 [facility] Discharge Summary   Patient: [name] FMW:[id number]  PCP: Osa Geralds, NP  Date of admission: 07/02/2024   Date of discharge:  07/03/2024     Discharge Diagnoses:  Principal Problem:   Chest pain Active Problems:   Coronary artery disease   Admitted From: Home Disposition:  Home   Recommendations for Outpatient Follow-up:  F/u with PCP in 1 wk F/u with Cardio in 1 wk, will need TAVR soon Follow up LABS/TEST:     Follow-up Information     Katina Albright, MD. Go today.   Specialty: Cardiology Why: 07/30 at 2:30 PM Contact information: [address] [place] [address] [phone]         Osa Geralds, NP Follow up in 1 week(s).   Specialty: Nurse Practitioner Contact information: [address] [place] [address] [phone]                Diet recommendation: Cardiac diet  Activity: The patient is advised to gradually reintroduce usual activities, as tolerated  Discharge Condition: stable  Code Status: Full code   History of present illness: As per the H and P dictated on admission.  Hospital Course:   [name] is a pleasant 63 y.o. male with medical history significant for  CAD S/P CABG, chronic HFpEF, severe aortic stenosis, HTN, HLD, obesity, COPD, recent GI bleed, alcohol  use disorder presented to [facility] for elective left heart catheterization.  Patient was recommended to undergo left heart catheterization for possible evaluation of severity of his stenosis for possibility of TAVR while he was in the hospital between 06/24/2024 and 06/27/2024.  He had a follow-up at outpatient clinic and he was electively brought in for left heart catheterization.  After left heart catheterization he underwent stent placement and hospitalist service was consulted for evaluation for admission for overnight observation.   ED Course: Left heart catheterization done 7/16 with the following findings. 1.  Severe native three-vessel  CAD 2.  LIMA to LAD and SVG to OM widely patent 3.  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  Successful direct stenting of the distal SVG to PDA with distal protection with a 4.0 x 15 mm drug-eluting stent with intravascular ultrasound guidance 5.  Aspirin  and clopidogrel  for at least 6 months followed by clopidogrel  indefinitely 6.  Continue workup for aortic valve replacemen   Assessment and Plan:  1.  CAD s/p CABG, now s/p successful direct stenting of distal SVG to PDA. - Follow recommendation by cardiologist to continue aspirin  and Plavix  for 6-month and Plavix  afterwards. Patient ambulated well in the halls without any severe symptoms.  Still has dyspnea on exertion that is possible secondary to aortic valve stenosis.  Patient was seen by cardiology and cleared for discharge to follow-up as an outpatient.  Patient agreed with the discharge planning.   2.  HTN, HLD - Continue home medications metoprolol , isosorbide , pravastatin    3.  Congestive heart failure/severe aortic stenosis - Continue medications metoprolol , isosorbide  - Demadex , spironolactone  and Farxiga      4.  GERD continue Protonix    Body mass index is 28.58 kg/m.  Nutrition Interventions:   - Patient was instructed, not to drive, operate heavy machinery, perform activities at heights, swimming or participation in water  activities or provide baby sitting services while on Pain, Sleep and Anxiety Medications; until his outpatient Physician has advised to do so again.  - Also recommended to not to take more than prescribed Pain, Sleep and Anxiety  Medications.  Patient was ambulatory without any assistance. On the day of the discharge the patient's vitals were stable, and no other acute medical condition were reported by patient. the patient was felt safe to be discharge at Home.  Consultants: Cardiologist Procedures: s/p Cardiac Cath  Discharge Exam: General: Appear in no distress, no Rash; Oral  Mucosa Clear, moist. Cardiovascular: S1 and S2 Present, no Murmur, Respiratory: normal respiratory effort, Bilateral Air entry present and no Crackles, no wheezes Abdomen: Bowel Sound present, Soft and no tenderness, no hernia Extremities: no Pedal edema, no calf tenderness Neurology: alert and oriented to time, place, and person affect appropriate.  Filed Weights   07/02/24 0924  Weight: 95.6 kg   Vitals:   07/03/24 0745 07/03/24 0851  BP: (!) 140/90   Pulse: 90   Resp: 19   Temp: 98.4 F (36.9 C)   SpO2: 99% 99%    DISCHARGE MEDICATION: Allergies as of 07/03/2024       Reactions   Wellbutrin  [bupropion ]    Paranoia         Medication List     STOP taking these medications    Vitamin D  (Ergocalciferol ) 1.25 MG (50000 UNIT) Caps capsule Commonly known as: DRISDOL        TAKE these medications    albuterol  108 (90 Base) MCG/ACT inhaler Commonly known as: VENTOLIN  HFA Inhale 2 puffs into the lungs every 6 (six) hours as needed for wheezing or shortness of breath.   ascorbic acid  500 MG tablet Commonly known as: VITAMIN C  Take 1 tablet (500 mg total) by mouth daily.   aspirin  EC 81 MG tablet Take 1 tablet (81 mg total) by mouth daily. Swallow whole.   clopidogrel  75 MG tablet Commonly known as: PLAVIX  Take 1 tablet (75 mg total) by mouth daily with breakfast.   dapagliflozin  propanediol 10 MG Tabs tablet Commonly known as: FARXIGA  Take 1 tablet (10 mg total) by mouth daily.   folic acid  1 MG tablet Commonly known as: FOLVITE  Take 1 tablet (1 mg total) by mouth daily.   iron  polysaccharides 150 MG capsule Commonly known as: NIFEREX Take 1 capsule (150 mg total) by mouth daily.   isosorbide  mononitrate 60 MG 24 hr tablet Commonly known as: IMDUR  Take 1 tablet (60 mg total) by mouth daily.   losartan  25 MG tablet Commonly known as: COZAAR  Take 0.5 tablets (12.5 mg total) by mouth daily. Start taking on: July 04, 2024   metoprolol  succinate 25  MG 24 hr tablet Commonly known as: TOPROL -XL Take 0.5 tablets (12.5 mg total) by mouth daily.   pantoprazole  40 MG tablet Commonly known as: PROTONIX  Take 1 tablet (40 mg total) by mouth daily.   pravastatin  20 MG tablet Commonly known as: PRAVACHOL  Take 1 tablet (20 mg total) by mouth at bedtime.   spironolactone  25 MG tablet Commonly known as: ALDACTONE  Take 0.5 tablets (12.5 mg total) by mouth daily.   torsemide  10 MG tablet Commonly known as: DEMADEX  Take 1 tablet (10 mg total) by mouth daily.       Allergies  Allergen Reactions   Wellbutrin  [Bupropion ]     Paranoia    Discharge Instructions     AMB Referral to Cardiac Rehabilitation - Phase II   Complete by: As directed    Diagnosis:  CABG Coronary Stents     CABG X ___: 3   After initial evaluation and assessments completed: Virtual Based Care may be provided alone or in conjunction with Phase 2 Cardiac  Rehab based on patient barriers.: Yes   Intensive Cardiac Rehabilitation (ICR) [facility] location only OR Traditional Cardiac Rehabilitation (TCR) *If criteria for ICR are not met will enroll in TCR [facility] only): Yes   Call MD for:   Complete by: As directed    Chest pain   Call MD for:  difficulty breathing, headache or visual disturbances   Complete by: As directed    Call MD for:  extreme fatigue   Complete by: As directed    Call MD for:  persistant dizziness or light-headedness   Complete by: As directed    Call MD for:  persistant nausea and vomiting   Complete by: As directed    Call MD for:  redness, tenderness, or signs of infection (pain, swelling, redness, odor or green/yellow discharge around incision site)   Complete by: As directed    Call MD for:  severe uncontrolled pain   Complete by: As directed    Call MD for:  temperature >100.4   Complete by: As directed    Diet - low sodium heart healthy   Complete by: As directed    Discharge instructions   Complete by: As directed    F/u with PCP in 1  wk F/u with Cardio in 1 wk, will need TAVR soon   Increase activity slowly   Complete by: As directed        The results of significant diagnostics from this hospitalization (including imaging, microbiology, ancillary and laboratory) are listed below for reference.    Significant Diagnostic Studies: CARDIAC CATHETERIZATION Result Date: 07/02/2024   Prox LAD to Mid LAD lesion is 90% stenosed.   Ost LM to Mid LM lesion is 50% stenosed.   Ost Cx to Prox Cx lesion is 90% stenosed.   Ost RCA to Prox RCA lesion is 100% stenosed.   Mid Graft to Dist Graft lesion is 95% stenosed.   Recommend uninterrupted dual antiplatelet therapy with Aspirin  81mg  daily and Clopidogrel  75mg  daily for a minimum of 6 months (stable ischemic heart disease-Class I recommendation). 1.  Severe native three-vessel CAD 2.  LIMA to LAD and SVG to OM widely patent 3.  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  Successful direct stenting of the distal SVG to PDA with distal protection with a 4.0 x 15 mm drug-eluting stent with intravascular ultrasound guidance 5.  Aspirin  and clopidogrel  for at least 6 months followed by clopidogrel  indefinitely 6.  Continue workup for aortic valve replacement   DG Chest 2 View Result Date: 06/24/2024 CLINICAL DATA:  sob EXAM: CHEST - 2 VIEW COMPARISON:  June 03, 2024 FINDINGS: Biapical pleural thickening. No focal airspace consolidation, pleural effusion, or pneumothorax. No cardiomegaly. Sternotomy wires and CABG markers. Tortuous aorta with aortic atherosclerosis. No acute fracture or destructive lesions. Multilevel thoracic osteophytosis. Osteopenia. IMPRESSION: No acute cardiopulmonary abnormality. Electronically Signed   By: Rogelia Myers M.D.   On: 06/24/2024 11:54   DG Chest 2 View Result Date: 06/05/2024 CLINICAL DATA:  Follow-up aspiration pneumonia. EXAM: CHEST - 2 VIEW COMPARISON:  05/09/2024. FINDINGS: Stable changes from prior CABG surgery. Cardiac silhouette is  top-normal in size. No mediastinal or hilar masses. No convincing adenopathy. Lungs are hyperexpanded. Patchy airspace opacities noted in the left mid to lower lung on the prior chest radiograph have mostly resolved. No new lung abnormalities. No pleural effusion or pneumothorax. Skeletal structures are intact. IMPRESSION: 1. Significant interval improvement in the left lower lobe aspiration pneumonitis/pneumonia. Electronically Signed  By: Alm Parkins M.D.   On: 06/05/2024 09:13    Microbiology: No results found for this or any previous visit (from the past 240 hours).   Labs: CBC: Recent Labs  Lab 06/27/24 0641 07/03/24 0401  WBC 8.9 8.4  HGB 10.1* 9.8*  HCT 32.8* 31.9*  MCV 86.3 86.9  PLT 213 254   Basic Metabolic Panel: Recent Labs  Lab 06/27/24 0641 07/03/24 0401  NA 136 136  K 4.0 4.1  CL 92* 98  CO2 32 29  GLUCOSE 108* 145*  BUN 21 15  CREATININE 1.26* 1.20  CALCIUM  9.2 8.9  MG 1.9  --    Liver Function Tests: No results for input(s): AST, ALT, ALKPHOS, BILITOT, PROT, ALBUMIN in the last 168 hours. No results for input(s): LIPASE, AMYLASE in the last 168 hours. No results for input(s): AMMONIA in the last 168 hours. Cardiac Enzymes: No results for input(s): CKTOTAL, CKMB, CKMBINDEX, TROPONINI in the last 168 hours. BNP (last 3 results) Recent Labs    05/08/24 1915 06/24/24 1108  BNP 614.9* 446.7*   CBG: No results for input(s): GLUCAP in the last 168 hours.  Time spent: 35 minutes  Signed:  Elvan Sor  [facility] 07/03/2024 10:59 AM

## 2024-07-03 NOTE — Evaluation (Signed)
 Physical Therapy Evaluation Patient Details Name: [name] MRN: [id number] DOB: 1961-09-23 Today's Date: 07/03/2024  History of Present Illness  [name] is a pleasant 63 y.o. male with medical history significant for  CAD S/P CABG, chronic HFpEF, severe aortic stenosis, HTN, HLD, obesity, COPD, recent GI bleed, alcohol  use disorder presented to [facility] for elective left heart catheterization.  Patient was recommended to undergo left heart catheterization for possible evaluation of severity of his stenosis for possibility of TAVR while he was in the hospital between 06/24/2024 and 06/27/2024.  He had a follow-up at outpatient clinic and he was electively brought in for left heart catheterization.  After left heart catheterization he underwent stent placement and hospitalist service was consulted for evaluation for admission for overnight observation.  Clinical Impression  Patient received sleeping in bed. He alerts to my voice. Patient is pleasant and agrees to PT assessment. He is independent with bed mobility and transfers. Ambulated 175 feet without AD and supervision. No lob or overt difficulties noted. Some mild SOB but O2 sats on room air in 90%s throughout session. He appears to be at baseline mobility and has no skilled PT needs at this time. Signing off.          If plan is discharge home, recommend the following:  N/a   Can travel by private vehicle    yes    Equipment Recommendations    Recommendations for Other Services       Functional Status Assessment Patient has not had a recent decline in their functional status     Precautions / Restrictions Precautions Recall of Precautions/Restrictions: Intact Restrictions Weight Bearing Restrictions Per Provider Order: No      Mobility  Bed Mobility Overal bed mobility: Independent                  Transfers Overall transfer level: Independent                      Ambulation/Gait Ambulation/Gait  assistance: Supervision Gait Distance (Feet): 175 Feet Assistive device: None   Gait velocity: WNL     General Gait Details: Patient ambulated around nursing station without difficulty. O2 sats remained greater than 92% on room air. Some mild SOB.  Stairs            Wheelchair Mobility     Tilt Bed    Modified Rankin (Stroke Patients Only)       Balance Overall balance assessment: Independent Sitting-balance support: Feet supported Sitting balance-Leahy Scale: Normal     Standing balance support: No upper extremity supported Standing balance-Leahy Scale: Good                               Pertinent Vitals/Pain Pain Assessment Pain Assessment: No/denies pain    Home Living Family/patient expects to be discharged to:: Private residence Living Arrangements: Alone Available Help at Discharge: Friend(s);Available PRN/intermittently Type of Home: House Home Access: Level entry       Home Layout: Two level;Able to live on main level with bedroom/bathroom Home Equipment: Rolling Walker (2 wheels) Additional Comments: reports living in a friends guest house    Prior Function Prior Level of Function : Independent/Modified Independent             Mobility Comments: reports 5 or 6 falls in the last 6 months ADLs Comments: 2L O2 baseline PRN at home; eats out vs makes simple meals,  friend's wife assists with med mgmt but pt often forgets to take AM/PM meds     Extremity/Trunk Assessment   Upper Extremity Assessment Upper Extremity Assessment: Overall WFL for tasks assessed    Lower Extremity Assessment Lower Extremity Assessment: Overall WFL for tasks assessed    Cervical / Trunk Assessment Cervical / Trunk Assessment: Normal  Communication   Communication Communication: No apparent difficulties    Cognition Arousal: Alert Behavior During Therapy: WFL for tasks assessed/performed   PT - Cognitive impairments: No apparent  impairments                         Following commands: Intact       Cueing Cueing Techniques: Verbal cues     General Comments      Exercises     Assessment/Plan    PT Assessment Patient does not need any further PT services  PT Problem List         PT Treatment Interventions      PT Goals (Current goals can be found in the Care Plan section)  Acute Rehab PT Goals Patient Stated Goal: go home PT Goal Formulation: With patient Time For Goal Achievement: 07/05/24 Potential to Achieve Goals: Good    Frequency       Co-evaluation               AM-PAC PT 6 Clicks Mobility  Outcome Measure Help needed turning from your back to your side while in a flat bed without using bedrails?: None Help needed moving from lying on your back to sitting on the side of a flat bed without using bedrails?: None Help needed moving to and from a bed to a chair (including a wheelchair)?: None Help needed standing up from a chair using your arms (e.g., wheelchair or bedside chair)?: None Help needed to walk in hospital room?: None Help needed climbing 3-5 steps with a railing? : A Little 6 Click Score: 23    End of Session   Activity Tolerance: Patient tolerated treatment well Patient left: in bed;with call bell/phone within reach Nurse Communication: Mobility status      Time: 1000-1010 PT Time Calculation (min) (ACUTE ONLY): 10 min   Charges:   PT Evaluation $PT Eval Low Complexity: 1 Low   PT General Charges $$ ACUTE PT VISIT: 1 Visit         [name], PT, GCS 07/03/24,10:34 AM

## 2024-07-04 LAB — LIPOPROTEIN A (LPA): Lipoprotein (a): 17.3 nmol/L (ref <75.0)

## 2024-07-11 ENCOUNTER — Emergency Department

## 2024-07-11 ENCOUNTER — Other Ambulatory Visit: Payer: Self-pay

## 2024-07-11 ENCOUNTER — Encounter: Payer: Self-pay | Admitting: Emergency Medicine

## 2024-07-11 ENCOUNTER — Inpatient Hospital Stay
Admission: EM | Admit: 2024-07-11 | Discharge: 2024-07-31 | DRG: 871 | Disposition: A | Discharge status: Skilled Nursing Facility | Attending: Internal Medicine | Admitting: Internal Medicine

## 2024-07-11 DIAGNOSIS — F0394 Unspecified dementia, unspecified severity, with anxiety: Secondary | ICD-10-CM | POA: Diagnosis present

## 2024-07-11 DIAGNOSIS — J9621 Acute and chronic respiratory failure with hypoxia: Secondary | ICD-10-CM | POA: Diagnosis present

## 2024-07-11 DIAGNOSIS — R1310 Dysphagia, unspecified: Secondary | ICD-10-CM | POA: Diagnosis present

## 2024-07-11 DIAGNOSIS — F1010 Alcohol abuse, uncomplicated: Secondary | ICD-10-CM | POA: Diagnosis not present

## 2024-07-11 DIAGNOSIS — N179 Acute kidney failure, unspecified: Secondary | ICD-10-CM | POA: Diagnosis not present

## 2024-07-11 DIAGNOSIS — J439 Emphysema, unspecified: Secondary | ICD-10-CM | POA: Diagnosis present

## 2024-07-11 DIAGNOSIS — I5033 Acute on chronic diastolic (congestive) heart failure: Secondary | ICD-10-CM | POA: Diagnosis present

## 2024-07-11 DIAGNOSIS — T380X5A Adverse effect of glucocorticoids and synthetic analogues, initial encounter: Secondary | ICD-10-CM | POA: Diagnosis not present

## 2024-07-11 DIAGNOSIS — I5A Non-ischemic myocardial injury (non-traumatic): Secondary | ICD-10-CM | POA: Diagnosis not present

## 2024-07-11 DIAGNOSIS — R739 Hyperglycemia, unspecified: Secondary | ICD-10-CM | POA: Diagnosis not present

## 2024-07-11 DIAGNOSIS — Z951 Presence of aortocoronary bypass graft: Secondary | ICD-10-CM

## 2024-07-11 DIAGNOSIS — I493 Ventricular premature depolarization: Secondary | ICD-10-CM | POA: Diagnosis present

## 2024-07-11 DIAGNOSIS — J189 Pneumonia, unspecified organism: Secondary | ICD-10-CM | POA: Diagnosis present

## 2024-07-11 DIAGNOSIS — J181 Lobar pneumonia, unspecified organism: Secondary | ICD-10-CM | POA: Diagnosis not present

## 2024-07-11 DIAGNOSIS — R079 Chest pain, unspecified: Secondary | ICD-10-CM | POA: Diagnosis present

## 2024-07-11 DIAGNOSIS — E876 Hypokalemia: Secondary | ICD-10-CM | POA: Diagnosis not present

## 2024-07-11 DIAGNOSIS — I5032 Chronic diastolic (congestive) heart failure: Secondary | ICD-10-CM

## 2024-07-11 DIAGNOSIS — I130 Hypertensive heart and chronic kidney disease with heart failure and stage 1 through stage 4 chronic kidney disease, or unspecified chronic kidney disease: Secondary | ICD-10-CM | POA: Diagnosis present

## 2024-07-11 DIAGNOSIS — J440 Chronic obstructive pulmonary disease with acute lower respiratory infection: Secondary | ICD-10-CM | POA: Diagnosis present

## 2024-07-11 DIAGNOSIS — G928 Other toxic encephalopathy: Secondary | ICD-10-CM | POA: Diagnosis not present

## 2024-07-11 DIAGNOSIS — Y95 Nosocomial condition: Secondary | ICD-10-CM | POA: Diagnosis present

## 2024-07-11 DIAGNOSIS — R0603 Acute respiratory distress: Secondary | ICD-10-CM | POA: Diagnosis not present

## 2024-07-11 DIAGNOSIS — Z9049 Acquired absence of other specified parts of digestive tract: Secondary | ICD-10-CM

## 2024-07-11 DIAGNOSIS — E669 Obesity, unspecified: Secondary | ICD-10-CM | POA: Diagnosis present

## 2024-07-11 DIAGNOSIS — I5031 Acute diastolic (congestive) heart failure: Secondary | ICD-10-CM | POA: Diagnosis not present

## 2024-07-11 DIAGNOSIS — N1831 Chronic kidney disease, stage 3a: Secondary | ICD-10-CM | POA: Diagnosis present

## 2024-07-11 DIAGNOSIS — Z87891 Personal history of nicotine dependence: Secondary | ICD-10-CM

## 2024-07-11 DIAGNOSIS — I350 Nonrheumatic aortic (valve) stenosis: Secondary | ICD-10-CM | POA: Diagnosis present

## 2024-07-11 DIAGNOSIS — R7989 Other specified abnormal findings of blood chemistry: Secondary | ICD-10-CM | POA: Diagnosis present

## 2024-07-11 DIAGNOSIS — I214 Non-ST elevation (NSTEMI) myocardial infarction: Secondary | ICD-10-CM | POA: Diagnosis not present

## 2024-07-11 DIAGNOSIS — J9602 Acute respiratory failure with hypercapnia: Secondary | ICD-10-CM | POA: Diagnosis not present

## 2024-07-11 DIAGNOSIS — Z9981 Dependence on supplemental oxygen: Secondary | ICD-10-CM

## 2024-07-11 DIAGNOSIS — I2489 Other forms of acute ischemic heart disease: Secondary | ICD-10-CM | POA: Diagnosis present

## 2024-07-11 DIAGNOSIS — Z515 Encounter for palliative care: Secondary | ICD-10-CM | POA: Diagnosis not present

## 2024-07-11 DIAGNOSIS — J690 Pneumonitis due to inhalation of food and vomit: Secondary | ICD-10-CM | POA: Diagnosis present

## 2024-07-11 DIAGNOSIS — D509 Iron deficiency anemia, unspecified: Secondary | ICD-10-CM | POA: Diagnosis present

## 2024-07-11 DIAGNOSIS — J9622 Acute and chronic respiratory failure with hypercapnia: Secondary | ICD-10-CM | POA: Diagnosis present

## 2024-07-11 DIAGNOSIS — I259 Chronic ischemic heart disease, unspecified: Secondary | ICD-10-CM | POA: Diagnosis not present

## 2024-07-11 DIAGNOSIS — R4189 Other symptoms and signs involving cognitive functions and awareness: Secondary | ICD-10-CM | POA: Diagnosis present

## 2024-07-11 DIAGNOSIS — I471 Supraventricular tachycardia, unspecified: Secondary | ICD-10-CM | POA: Diagnosis not present

## 2024-07-11 DIAGNOSIS — I700 Atherosclerosis of aorta: Secondary | ICD-10-CM | POA: Diagnosis present

## 2024-07-11 DIAGNOSIS — Z7982 Long term (current) use of aspirin: Secondary | ICD-10-CM

## 2024-07-11 DIAGNOSIS — E870 Hyperosmolality and hypernatremia: Secondary | ICD-10-CM | POA: Diagnosis present

## 2024-07-11 DIAGNOSIS — R008 Other abnormalities of heart beat: Secondary | ICD-10-CM | POA: Diagnosis not present

## 2024-07-11 DIAGNOSIS — A419 Sepsis, unspecified organism: Secondary | ICD-10-CM | POA: Diagnosis present

## 2024-07-11 DIAGNOSIS — D649 Anemia, unspecified: Secondary | ICD-10-CM | POA: Diagnosis not present

## 2024-07-11 DIAGNOSIS — R7401 Elevation of levels of liver transaminase levels: Secondary | ICD-10-CM | POA: Diagnosis not present

## 2024-07-11 DIAGNOSIS — E785 Hyperlipidemia, unspecified: Secondary | ICD-10-CM | POA: Diagnosis present

## 2024-07-11 DIAGNOSIS — D508 Other iron deficiency anemias: Secondary | ICD-10-CM | POA: Diagnosis not present

## 2024-07-11 DIAGNOSIS — I472 Ventricular tachycardia, unspecified: Secondary | ICD-10-CM | POA: Diagnosis not present

## 2024-07-11 DIAGNOSIS — E8729 Other acidosis: Secondary | ICD-10-CM | POA: Diagnosis not present

## 2024-07-11 DIAGNOSIS — Z79899 Other long term (current) drug therapy: Secondary | ICD-10-CM

## 2024-07-11 DIAGNOSIS — F10231 Alcohol dependence with withdrawal delirium: Secondary | ICD-10-CM | POA: Diagnosis not present

## 2024-07-11 DIAGNOSIS — I25810 Atherosclerosis of coronary artery bypass graft(s) without angina pectoris: Secondary | ICD-10-CM | POA: Diagnosis not present

## 2024-07-11 DIAGNOSIS — J441 Chronic obstructive pulmonary disease with (acute) exacerbation: Secondary | ICD-10-CM | POA: Diagnosis not present

## 2024-07-11 DIAGNOSIS — Z955 Presence of coronary angioplasty implant and graft: Secondary | ICD-10-CM

## 2024-07-11 DIAGNOSIS — I639 Cerebral infarction, unspecified: Secondary | ICD-10-CM | POA: Diagnosis not present

## 2024-07-11 DIAGNOSIS — I469 Cardiac arrest, cause unspecified: Secondary | ICD-10-CM | POA: Diagnosis not present

## 2024-07-11 DIAGNOSIS — Z6830 Body mass index (BMI) 30.0-30.9, adult: Secondary | ICD-10-CM

## 2024-07-11 DIAGNOSIS — I252 Old myocardial infarction: Secondary | ICD-10-CM

## 2024-07-11 DIAGNOSIS — J9601 Acute respiratory failure with hypoxia: Secondary | ICD-10-CM | POA: Diagnosis not present

## 2024-07-11 DIAGNOSIS — Z7902 Long term (current) use of antithrombotics/antiplatelets: Secondary | ICD-10-CM

## 2024-07-11 DIAGNOSIS — E872 Acidosis, unspecified: Secondary | ICD-10-CM | POA: Diagnosis present

## 2024-07-11 DIAGNOSIS — I10 Essential (primary) hypertension: Secondary | ICD-10-CM | POA: Diagnosis not present

## 2024-07-11 DIAGNOSIS — I959 Hypotension, unspecified: Secondary | ICD-10-CM | POA: Diagnosis not present

## 2024-07-11 DIAGNOSIS — F1090 Alcohol use, unspecified, uncomplicated: Secondary | ICD-10-CM | POA: Diagnosis not present

## 2024-07-11 DIAGNOSIS — I2510 Atherosclerotic heart disease of native coronary artery without angina pectoris: Secondary | ICD-10-CM | POA: Diagnosis present

## 2024-07-11 DIAGNOSIS — Z8673 Personal history of transient ischemic attack (TIA), and cerebral infarction without residual deficits: Secondary | ICD-10-CM

## 2024-07-11 DIAGNOSIS — R6521 Severe sepsis with septic shock: Secondary | ICD-10-CM | POA: Diagnosis not present

## 2024-07-11 LAB — COMPREHENSIVE METABOLIC PANEL WITH GFR
ALT: 16 U/L (ref 0–44)
AST: 21 U/L (ref 15–41)
Albumin: 3.5 g/dL (ref 3.5–5.0)
Alkaline Phosphatase: 43 U/L (ref 38–126)
Anion gap: 14 (ref 5–15)
BUN: 11 mg/dL (ref 8–23)
CO2: 23 mmol/L (ref 22–32)
Calcium: 8.7 mg/dL — ABNORMAL LOW (ref 8.9–10.3)
Chloride: 98 mmol/L (ref 98–111)
Creatinine, Ser: 0.91 mg/dL (ref 0.61–1.24)
GFR, Estimated: >60 mL/min (ref >60)
Glucose, Bld: 102 mg/dL — ABNORMAL HIGH (ref 70–99)
Potassium: 4.3 mmol/L (ref 3.5–5.1)
Sodium: 135 mmol/L (ref 135–145)
Total Bilirubin: 0.9 mg/dL (ref 0.0–1.2)
Total Protein: 7.3 g/dL (ref 6.5–8.1)

## 2024-07-11 LAB — CBC
HCT: 26.4 % — ABNORMAL LOW (ref 39.0–52.0)
Hemoglobin: 8.2 g/dL — ABNORMAL LOW (ref 13.0–17.0)
MCH: 27 pg (ref 26.0–34.0)
MCHC: 31.1 g/dL (ref 30.0–36.0)
MCV: 86.8 fL (ref 80.0–100.0)
Platelets: 296 K/uL (ref 150–400)
RBC: 3.04 MIL/uL — ABNORMAL LOW (ref 4.22–5.81)
RDW: 15.8 % — ABNORMAL HIGH (ref 11.5–15.5)
WBC: 6.2 K/uL (ref 4.0–10.5)
nRBC: 0 % (ref 0.0–0.2)

## 2024-07-11 LAB — APTT: aPTT: 34 s (ref 24–36)

## 2024-07-11 LAB — CBC WITH DIFFERENTIAL/PLATELET
Abs Immature Granulocytes: 0.02 K/uL (ref 0.00–0.07)
Basophils Absolute: 0.2 K/uL — ABNORMAL HIGH (ref 0.0–0.1)
Basophils Relative: 2 %
Eosinophils Absolute: 0.6 K/uL — ABNORMAL HIGH (ref 0.0–0.5)
Eosinophils Relative: 7 %
HCT: 26.7 % — ABNORMAL LOW (ref 39.0–52.0)
Hemoglobin: 8.3 g/dL — ABNORMAL LOW (ref 13.0–17.0)
Immature Granulocytes: 0 %
Lymphocytes Relative: 25 %
Lymphs Abs: 2.2 K/uL (ref 0.7–4.0)
MCH: 26.8 pg (ref 26.0–34.0)
MCHC: 31.1 g/dL (ref 30.0–36.0)
MCV: 86.1 fL (ref 80.0–100.0)
Monocytes Absolute: 0.8 K/uL (ref 0.1–1.0)
Monocytes Relative: 10 %
Neutro Abs: 4.9 K/uL (ref 1.7–7.7)
Neutrophils Relative %: 56 %
Platelets: 339 K/uL (ref 150–400)
RBC: 3.1 MIL/uL — ABNORMAL LOW (ref 4.22–5.81)
RDW: 15.6 % — ABNORMAL HIGH (ref 11.5–15.5)
WBC: 8.6 K/uL (ref 4.0–10.5)
nRBC: 0 % (ref 0.0–0.2)

## 2024-07-11 LAB — TROPONIN I (HIGH SENSITIVITY)
Troponin I (High Sensitivity): 44 ng/L — ABNORMAL HIGH (ref <18)
Troponin I (High Sensitivity): 144 ng/L (ref <18)
Troponin I (High Sensitivity): 205 ng/L (ref <18)

## 2024-07-11 LAB — BLOOD GAS, VENOUS
Acid-Base Excess: 0.3 mmol/L (ref 0.0–2.0)
Bicarbonate: 25.4 mmol/L (ref 20.0–28.0)
O2 Saturation: 97.2 %
Patient temperature: 37
pCO2, Ven: 42 mmHg — ABNORMAL LOW (ref 44–60)
pH, Ven: 7.39 (ref 7.25–7.43)
pO2, Ven: 81 mmHg — ABNORMAL HIGH (ref 32–45)

## 2024-07-11 LAB — PROTIME-INR
INR: 1.1 (ref 0.8–1.2)
Prothrombin Time: 15.3 s — ABNORMAL HIGH (ref 11.4–15.2)

## 2024-07-11 LAB — LACTIC ACID, PLASMA
Lactic Acid, Venous: 3.6 mmol/L (ref 0.5–1.9)
Lactic Acid, Venous: 5.9 mmol/L (ref 0.5–1.9)

## 2024-07-11 LAB — BRAIN NATRIURETIC PEPTIDE: B Natriuretic Peptide: 222.2 pg/mL — ABNORMAL HIGH (ref 0.0–100.0)

## 2024-07-11 MED ORDER — METHYLPREDNISOLONE SODIUM SUCC 125 MG IJ SOLR
80.0000 mg | INTRAMUSCULAR | Status: DC
  Administered 2024-07-12: 80 mg via INTRAVENOUS
  Filled 2024-07-11: qty 2

## 2024-07-11 MED ORDER — SODIUM CHLORIDE 0.9 % IV BOLUS
500.0000 mL | Freq: Once | INTRAVENOUS | Status: AC
  Administered 2024-07-11: 500 mL via INTRAVENOUS

## 2024-07-11 MED ORDER — SODIUM CHLORIDE 0.9 % IV SOLN: 1.0000 g | Freq: Once | INTRAVENOUS | Status: DC

## 2024-07-11 MED ORDER — SODIUM CHLORIDE 0.9 % IV SOLN
2.0000 g | Freq: Three times a day (TID) | INTRAVENOUS | Status: DC
  Administered 2024-07-11 – 2024-07-12 (×3): 2 g via INTRAVENOUS
  Filled 2024-07-11 (×4): qty 12.5

## 2024-07-11 MED ORDER — OXYCODONE-ACETAMINOPHEN 5-325 MG PO TABS: 1.0000 | ORAL_TABLET | ORAL | Status: DC | PRN

## 2024-07-11 MED ORDER — FOLIC ACID 1 MG PO TABS
1.0000 mg | ORAL_TABLET | Freq: Every day | ORAL | Status: DC
Start: 2024-07-12 — End: 2024-07-15
  Administered 2024-07-12 – 2024-07-15 (×3): 1 mg via ORAL
  Filled 2024-07-11 (×3): qty 1

## 2024-07-11 MED ORDER — ENOXAPARIN SODIUM 40 MG/0.4ML IJ SOSY
40.0000 mg | PREFILLED_SYRINGE | INTRAMUSCULAR | Status: DC
  Filled 2024-07-11: qty 0.4

## 2024-07-11 MED ORDER — SODIUM CHLORIDE 0.9 % IV SOLN
2.0000 g | Freq: Once | INTRAVENOUS | Status: DC
  Filled 2024-07-11: qty 20

## 2024-07-11 MED ORDER — IPRATROPIUM-ALBUTEROL 0.5-2.5 (3) MG/3ML IN SOLN
3.0000 mL | Freq: Once | RESPIRATORY_TRACT | Status: AC
  Administered 2024-07-11: 3 mL via RESPIRATORY_TRACT
  Filled 2024-07-11: qty 3

## 2024-07-11 MED ORDER — POLYSACCHARIDE IRON COMPLEX 150 MG PO CAPS
150.0000 mg | ORAL_CAPSULE | Freq: Every day | ORAL | Status: DC
Start: 2024-07-12 — End: 2024-07-15
  Administered 2024-07-13 – 2024-07-15 (×2): 150 mg via ORAL
  Filled 2024-07-11 (×4): qty 1

## 2024-07-11 MED ORDER — NITROGLYCERIN 0.4 MG SL SUBL: 0.4000 mg | SUBLINGUAL_TABLET | SUBLINGUAL | Status: DC | PRN

## 2024-07-11 MED ORDER — SODIUM CHLORIDE 0.9 % IV BOLUS
1000.0000 mL | Freq: Once | INTRAVENOUS | Status: AC
  Administered 2024-07-11: 1000 mL via INTRAVENOUS

## 2024-07-11 MED ORDER — IPRATROPIUM-ALBUTEROL 0.5-2.5 (3) MG/3ML IN SOLN
6.0000 mL | Freq: Once | RESPIRATORY_TRACT | Status: AC
  Administered 2024-07-11: 6 mL via RESPIRATORY_TRACT
  Filled 2024-07-11: qty 3

## 2024-07-11 MED ORDER — IPRATROPIUM-ALBUTEROL 0.5-2.5 (3) MG/3ML IN SOLN: 9.0000 mL | Freq: Once | RESPIRATORY_TRACT | Status: DC

## 2024-07-11 MED ORDER — SODIUM CHLORIDE 0.9 % IV SOLN
500.0000 mg | Freq: Once | INTRAVENOUS | Status: DC
  Filled 2024-07-11: qty 5

## 2024-07-11 MED ORDER — VITAMIN C 500 MG PO TABS
500.0000 mg | ORAL_TABLET | Freq: Every day | ORAL | Status: DC
Start: 2024-07-12 — End: 2024-07-15
  Administered 2024-07-12 – 2024-07-15 (×3): 500 mg via ORAL
  Filled 2024-07-11 (×3): qty 1

## 2024-07-11 MED ORDER — VANCOMYCIN HCL 2000 MG/400ML IV SOLN
2000.0000 mg | Freq: Once | INTRAVENOUS | Status: AC
  Administered 2024-07-11: 2000 mg via INTRAVENOUS
  Filled 2024-07-11: qty 400

## 2024-07-11 MED ORDER — HYDRALAZINE HCL 20 MG/ML IJ SOLN: 5.0000 mg | INTRAMUSCULAR | Status: DC | PRN

## 2024-07-11 MED ORDER — MORPHINE SULFATE (PF) 2 MG/ML IV SOLN
2.0000 mg | Freq: Once | INTRAVENOUS | Status: AC
  Administered 2024-07-11: 2 mg via INTRAVENOUS
  Filled 2024-07-11: qty 1

## 2024-07-11 MED ORDER — THIAMINE MONONITRATE 100 MG PO TABS
100.0000 mg | ORAL_TABLET | Freq: Every day | ORAL | Status: DC
  Administered 2024-07-11 – 2024-07-15 (×3): 100 mg via ORAL
  Filled 2024-07-11 (×4): qty 1

## 2024-07-11 MED ORDER — IPRATROPIUM-ALBUTEROL 0.5-2.5 (3) MG/3ML IN SOLN
3.0000 mL | RESPIRATORY_TRACT | Status: DC
  Administered 2024-07-11 – 2024-07-12 (×3): 3 mL via RESPIRATORY_TRACT
  Filled 2024-07-11 (×3): qty 3

## 2024-07-11 MED ORDER — ACETAMINOPHEN 325 MG PO TABS: 650.0000 mg | ORAL_TABLET | Freq: Four times a day (QID) | ORAL | Status: DC | PRN

## 2024-07-11 MED ORDER — MORPHINE SULFATE (PF) 2 MG/ML IV SOLN: 2.0000 mg | INTRAVENOUS | Status: DC | PRN

## 2024-07-11 MED ORDER — SODIUM CHLORIDE 0.9 % IV SOLN: INTRAVENOUS | Status: DC

## 2024-07-11 MED ORDER — ADULT MULTIVITAMIN W/MINERALS CH
1.0000 | ORAL_TABLET | Freq: Every day | ORAL | Status: DC
  Administered 2024-07-11 – 2024-07-15 (×4): 1 via ORAL
  Filled 2024-07-11 (×4): qty 1

## 2024-07-11 MED ORDER — OXYCODONE-ACETAMINOPHEN 5-325 MG PO TABS
1.0000 | ORAL_TABLET | Freq: Once | ORAL | Status: AC
  Administered 2024-07-11: 1 via ORAL
  Filled 2024-07-11: qty 1

## 2024-07-11 MED ORDER — PANTOPRAZOLE SODIUM 40 MG IV SOLR
40.0000 mg | Freq: Two times a day (BID) | INTRAVENOUS | Status: DC
  Administered 2024-07-11 – 2024-07-27 (×33): 40 mg via INTRAVENOUS
  Filled 2024-07-11 (×34): qty 10

## 2024-07-11 MED ORDER — IOHEXOL 350 MG/ML SOLN
75.0000 mL | Freq: Once | INTRAVENOUS | Status: AC | PRN
  Administered 2024-07-11: 75 mL via INTRAVENOUS

## 2024-07-11 MED ORDER — FOLIC ACID 1 MG PO TABS: 1.0000 mg | ORAL_TABLET | Freq: Every day | ORAL | Status: DC

## 2024-07-11 MED ORDER — VANCOMYCIN HCL 1250 MG/250ML IV SOLN
1250.0000 mg | Freq: Two times a day (BID) | INTRAVENOUS | Status: DC
  Administered 2024-07-12: 1250 mg via INTRAVENOUS
  Filled 2024-07-11 (×2): qty 250

## 2024-07-11 MED ORDER — THIAMINE HCL 100 MG/ML IJ SOLN
100.0000 mg | Freq: Every day | INTRAMUSCULAR | Status: DC
  Administered 2024-07-12 – 2024-07-14 (×2): 100 mg via INTRAVENOUS
  Filled 2024-07-11 (×4): qty 2

## 2024-07-11 MED ORDER — LORAZEPAM 2 MG/ML IJ SOLN
1.0000 mg | INTRAMUSCULAR | Status: DC | PRN
  Administered 2024-07-13: 2 mg via INTRAVENOUS
  Filled 2024-07-11 (×2): qty 1

## 2024-07-11 MED ORDER — LORAZEPAM 1 MG PO TABS
1.0000 mg | ORAL_TABLET | ORAL | Status: DC | PRN
  Administered 2024-07-12 – 2024-07-13 (×3): 1 mg via ORAL
  Filled 2024-07-11 (×3): qty 1

## 2024-07-11 MED ORDER — PANTOPRAZOLE SODIUM 40 MG PO TBEC: 40.0000 mg | DELAYED_RELEASE_TABLET | Freq: Every day | ORAL | Status: DC

## 2024-07-11 MED ORDER — LACTATED RINGERS IV BOLUS
1000.0000 mL | Freq: Once | INTRAVENOUS | Status: AC
  Administered 2024-07-11: 1000 mL via INTRAVENOUS

## 2024-07-11 MED ORDER — ALBUTEROL SULFATE (2.5 MG/3ML) 0.083% IN NEBU
3.0000 mL | INHALATION_SOLUTION | RESPIRATORY_TRACT | Status: DC | PRN
  Administered 2024-07-12 – 2024-07-30 (×10): 3 mL via RESPIRATORY_TRACT
  Filled 2024-07-11 (×9): qty 3

## 2024-07-11 MED ORDER — METHYLPREDNISOLONE SODIUM SUCC 125 MG IJ SOLR
125.0000 mg | Freq: Once | INTRAMUSCULAR | Status: AC
  Administered 2024-07-11: 125 mg via INTRAVENOUS
  Filled 2024-07-11: qty 2

## 2024-07-11 MED ORDER — ONDANSETRON HCL 4 MG/2ML IJ SOLN
4.0000 mg | Freq: Three times a day (TID) | INTRAMUSCULAR | Status: DC | PRN
  Administered 2024-07-16: 4 mg via INTRAVENOUS
  Filled 2024-07-11: qty 2

## 2024-07-11 MED ORDER — DM-GUAIFENESIN ER 30-600 MG PO TB12
1.0000 | ORAL_TABLET | Freq: Two times a day (BID) | ORAL | Status: DC | PRN
  Administered 2024-07-12: 1 via ORAL
  Filled 2024-07-11 (×2): qty 1

## 2024-07-11 MED ORDER — PRAVASTATIN SODIUM 20 MG PO TABS
20.0000 mg | ORAL_TABLET | Freq: Every day | ORAL | Status: DC
Start: 2024-07-11 — End: 2024-07-14
  Administered 2024-07-11 – 2024-07-12 (×2): 20 mg via ORAL
  Filled 2024-07-11 (×2): qty 1

## 2024-07-11 NOTE — Consult Note (Signed)
 Pharmacy Antibiotic Note  [name] is a 63 y.o. male admitted on 07/11/2024 with pneumonia. Pharmacy has been consulted for vancomycin dosing.  Today, 07/11/2024:  Day 1 of vancomycin  WBC 8.6  Afebrile  SCr 0.91 today with estimated CrCl of 96.8 mL/min  Renal function appears to be at baseline  Currently on 2L [place]   Plan:  Give vancomycin 2000 mg IV x 1 dose, followed by vancomycin 1250 mg IV Q12H Goal AUC 400-550  Estimated AUC 467.8 Estimated Cmin 13.2 SCr used 0.91  IBW used, Vd 0.72 (BMI 28.40)  F/u MRSA nares for possible de-escalation of antibiotics  Pharmacy will continue to monitor and dose adjust appropriately  Height: 6' (182.9 cm) Weight: 95 kg (209 lb 7 oz) IBW/kg (Calculated) : 77.6  Temp (24hrs), Avg:98.2 F (36.8 C), Min:98.1 F (36.7 C), Max:98.2 F (36.8 C)  Recent Labs  Lab 07/11/24 1706  WBC 8.6  CREATININE 0.91    Estimated Creatinine Clearance: 96.8 mL/min (by C-G formula based on SCr of 0.91 mg/dL).    Allergies  Allergen Reactions   Wellbutrin  [Bupropion ]     Paranoia    Antimicrobials this admission: 7/25 vancomycin >>  7/25 cefepime >>   Dose adjustments this admission:  Microbiology results: 7/25 MRSA PCR: ordered  7/25 BCx: sent   Thank you for allowing pharmacy to be a part of this patient's care.  Evonnie Nieves, PharmD Pharmacy Resident  07/11/2024 8:01 PM

## 2024-07-11 NOTE — ED Provider Notes (Signed)
 [facility] Provider Note    Event Date/Time   First MD Initiated Contact with Patient 07/11/24 1650     (approximate)   History   Chief Complaint Shortness of Breath   HPI  [name] is a 63 y.o. male with a past medical history of hypertension, CAD, COPD, chronic hypoxic respiratory failure on 2 L, HFpEF, aortic stenosis, and alcohol  abuse who presents to the ED complaining of shortness of breath.  Patient reports that he has had increasing difficulty breathing with stretching pain across his chest for about the past 2 hours.  He reports cough productive of yellowish sputum but is not aware of any fevers.  He does state that his legs have been slightly swollen, denies any pain in either leg.  When EMS arrived, they found patient's home to be 80 degrees and patient states that he only has a small window unit for his bedroom.  Oxygen saturations were reassuring with EMS on his usual 2 L.  He was given 324 mg of aspirin  with EMS.     Physical Exam   Triage Vital Signs: ED Triage Vitals  Encounter Vitals Group     BP      Girls Systolic BP Percentile      Girls Diastolic BP Percentile      Boys Systolic BP Percentile      Boys Diastolic BP Percentile      Pulse      Resp      Temp      Temp src      SpO2      Weight      Height      Head Circumference      Peak Flow      Pain Score      Pain Loc      Pain Education      Exclude from Growth Chart     Most recent vital signs: Vitals:   07/11/24 1729 07/11/24 1730  BP:  105/60  Pulse:  (!) 102  Resp:  (!) 23  Temp: 98.1 F (36.7 C)   SpO2:  100%    Constitutional: Alert and oriented. Eyes: Conjunctivae are normal. Head: Atraumatic. Nose: No congestion/rhinnorhea. Mouth/Throat: Mucous membranes are moist.  Cardiovascular: Tachycardic, regular rhythm.  Systolic murmur noted.  2+ radial pulses bilaterally. Respiratory: Tachypneic with increased respiratory effort, poor air movement  with wheezing throughout. Gastrointestinal: Soft and nontender. No distention. Musculoskeletal: No lower extremity tenderness, 1+ pitting edema to knees bilaterally. Neurologic:  Normal speech and language. No gross focal neurologic deficits are appreciated.    ED Results / Procedures / Treatments   Labs (all labs ordered are listed, but only abnormal results are displayed) Labs Reviewed  CBC WITH DIFFERENTIAL/PLATELET - Abnormal; Notable for the following components:      Result Value   RBC 3.10 (*)    Hemoglobin 8.3 (*)    HCT 26.7 (*)    RDW 15.6 (*)    Eosinophils Absolute 0.6 (*)    Basophils Absolute 0.2 (*)    All other components within normal limits  BLOOD GAS, VENOUS - Abnormal; Notable for the following components:   pCO2, Ven 42 (*)    pO2, Ven 81 (*)    All other components within normal limits  COMPREHENSIVE METABOLIC PANEL WITH GFR - Abnormal; Notable for the following components:   Glucose, Bld 102 (*)    Calcium  8.7 (*)    All other components within  normal limits  BRAIN NATRIURETIC PEPTIDE - Abnormal; Notable for the following components:   B Natriuretic Peptide 222.2 (*)    All other components within normal limits  LACTIC ACID, PLASMA - Abnormal; Notable for the following components:   Lactic Acid, Venous 3.6 (*)    All other components within normal limits  TROPONIN I (HIGH SENSITIVITY) - Abnormal; Notable for the following components:   Troponin I (High Sensitivity) 44 (*)    All other components within normal limits  TROPONIN I (HIGH SENSITIVITY) - Abnormal; Notable for the following components:   Troponin I (High Sensitivity) 144 (*)    All other components within normal limits  CULTURE, BLOOD (ROUTINE X 2)  CULTURE, BLOOD (ROUTINE X 2)  RESP PANEL BY RT-PCR (RSV, FLU A&B, COVID)  RVPGX2  MRSA NEXT GEN BY PCR, NASAL  EXPECTORATED SPUTUM ASSESSMENT W GRAM STAIN, RFLX TO RESP C  LACTIC ACID, PLASMA  URINE DRUG SCREEN, QUALITATIVE ([facility] ONLY)   STREP PNEUMONIAE URINARY ANTIGEN  LEGIONELLA PNEUMOPHILA SEROGP 1 UR AG  BASIC METABOLIC PANEL WITH GFR  CBC  PROTIME-INR  APTT     EKG  ED ECG REPORT I, Carlin Palin, the attending physician, personally viewed and interpreted this ECG.   Date: 07/11/2024  EKG Time: 16:49  Rate: 114  Rhythm: sinus tachycardia  Axis: Normal  Intervals:nonspecific intraventricular conduction delay  ST&T Change: None  RADIOLOGY Chest x-ray reviewed and interpreted by me with no infiltrate, edema, or effusion.  PROCEDURES:  Critical Care performed: No  Procedures   MEDICATIONS ORDERED IN ED: Medications  ipratropium-albuterol  (DUONEB) 0.5-2.5 (3) MG/3ML nebulizer solution 3 mL (3 mLs Nebulization Given 07/11/24 2010)  albuterol  (PROVENTIL ) (2.5 MG/3ML) 0.083% nebulizer solution 3 mL (has no administration in time range)  dextromethorphan-guaiFENesin  (MUCINEX  DM) 30-600 MG per 12 hr tablet 1 tablet (has no administration in time range)  methylPREDNISolone  sodium succinate (SOLU-MEDROL ) 125 mg/2 mL injection 80 mg (has no administration in time range)  nitroGLYCERIN  (NITROSTAT ) SL tablet 0.4 mg (has no administration in time range)  morphine  (PF) 2 MG/ML injection 2 mg (has no administration in time range)  oxyCODONE -acetaminophen  (PERCOCET/ROXICET) 5-325 MG per tablet 1 tablet (has no administration in time range)  acetaminophen  (TYLENOL ) tablet 650 mg (has no administration in time range)  ondansetron  (ZOFRAN ) injection 4 mg (has no administration in time range)  hydrALAZINE  (APRESOLINE ) injection 5 mg (has no administration in time range)  LORazepam  (ATIVAN ) tablet 1-4 mg (has no administration in time range)    Or  LORazepam  (ATIVAN ) injection 1-4 mg (has no administration in time range)  thiamine  (VITAMIN B1) tablet 100 mg (100 mg Oral Given 07/11/24 2010)    Or  thiamine  (VITAMIN B1) injection 100 mg ( Intravenous See Alternative 07/11/24 2010)  multivitamin with minerals tablet 1  tablet (1 tablet Oral Given 07/11/24 2010)  ceFEPIme (MAXIPIME) 2 g in sodium chloride  0.9 % 100 mL IVPB (0 g Intravenous Stopped 07/11/24 2045)  vancomycin (VANCOREADY) IVPB 2000 mg/400 mL (has no administration in time range)  vancomycin (VANCOREADY) IVPB 1250 mg/250 mL (has no administration in time range)  pravastatin  (PRAVACHOL ) tablet 20 mg (has no administration in time range)  pantoprazole  (PROTONIX ) EC tablet 40 mg (has no administration in time range)  folic acid  (FOLVITE ) tablet 1 mg (has no administration in time range)  iron  polysaccharides (NIFEREX) capsule 150 mg (has no administration in time range)  ascorbic acid  (VITAMIN C ) tablet 500 mg (has no administration in time range)  sodium chloride   0.9 % bolus 500 mL (has no administration in time range)  0.9 %  sodium chloride  infusion (has no administration in time range)  methylPREDNISolone  sodium succinate (SOLU-MEDROL ) 125 mg/2 mL injection 125 mg (125 mg Intravenous Given 07/11/24 1718)  ipratropium-albuterol  (DUONEB) 0.5-2.5 (3) MG/3ML nebulizer solution 3 mL (3 mLs Nebulization Given 07/11/24 1716)  lactated ringers  bolus 1,000 mL (0 mLs Intravenous Stopped 07/11/24 1858)  ipratropium-albuterol  (DUONEB) 0.5-2.5 (3) MG/3ML nebulizer solution 6 mL (6 mLs Nebulization Given 07/11/24 1819)  morphine  (PF) 2 MG/ML injection 2 mg (2 mg Intravenous Given 07/11/24 1833)  iohexol  (OMNIPAQUE ) 350 MG/ML injection 75 mL (75 mLs Intravenous Contrast Given 07/11/24 1839)  oxyCODONE -acetaminophen  (PERCOCET/ROXICET) 5-325 MG per tablet 1 tablet (1 tablet Oral Given 07/11/24 1946)     IMPRESSION / MDM / ASSESSMENT AND PLAN / ED COURSE  I reviewed the triage vital signs and the nursing notes.                              63 y.o. male with past medical history of hypertension, CAD, aortic stenosis, HFpEF, COPD on 2 L, and alcohol  abuse who presents to the ED with chest pain, cough, and increasing difficulty breathing over the past 24  hours.  Patient's presentation is most consistent with acute presentation with potential threat to life or bodily function.  Differential diagnosis includes, but is not limited to, CHF exacerbation, COPD exacerbation, ACS, PE, pneumonia, pneumothorax, musculoskeletal pain, GERD, anxiety, anemia, electrolyte abnormality, AKI.  Patient ill-appearing and in moderate respiratory distress with increased work of breathing and wheezing throughout.  He is maintaining oxygen saturations on 2 L nasal cannula, will give IV Solu-Medrol  and DuoNeb, check VBG.  EKG shows normal sinus rhythm with no ischemic changes, patient did have coronary stent placement 9 days ago.  Chest x-ray and labs are pending at this time.  Patient also with low BP and we will give IV fluid bolus.  VBG is reassuring without significant hypercapnia, labs with some acute on chronic anemia but no evidence of bleeding at this time.  No significant electrolyte abnormality, AKI, or LFT abnormality.  Patient does have some elevation in troponin but low suspicion for ACS for now, will trend.  CTA chest is negative for PE but does show evidence of pneumonia, will start on IV antibiotics.  Work of breathing slightly improved on reassessment, will give additional DuoNebs.  Case discussed with hospitalist for admission.      FINAL CLINICAL IMPRESSION(S) / ED DIAGNOSES   Final diagnoses:  COPD exacerbation (HCC)  Pneumonia due to infectious organism, unspecified laterality, unspecified part of lung     Rx / DC Orders   ED Discharge Orders     None        Note:  This document was prepared using Dragon voice recognition software and may include unintentional dictation errors.   Willo Dunnings, MD 07/11/24 2050

## 2024-07-11 NOTE — ED Triage Notes (Signed)
 Pt to ED via [facility] from home for chest pain and shortness of breath. Per EMS when they arrived pt c/o shortness of breath and chest pain. Pt on his baseline 2 liters of oxygen but had it on 100 feet of tubing. Inside of pts house was 80 degrees. Pt states that he only has a window unit in his house currently. Pt has used his inhaler at home without relief. Pt had a heart cath with stent placement on Wednesday. Pt c/o chest pain that feels like a squeezing pain. Pt has audible wheezing and is tachypneic on arrival. Pt was given 324 mg of Aspirin  by EMS. Pt has mild pitting edema.

## 2024-07-11 NOTE — H&P (Addendum)
 History and Physical    [name] FMW:[id number] DOB: 26-May-1961 DOA: 07/11/2024  Referring MD/NP/PA:   PCP: Osa Geralds, NP   Patient coming from:  The patient is coming from home.     Chief Complaint: SOB, cough and chest pain  HPI: [name] is a 63 y.o. male with medical history significant of  GIB, alcohol  abuse, COPD on 2 L oxygen, hypertension, CAD with prior CABG and recent Stent placement, HLD, stroke, dementia, dCHF, presents with SOB, cough, chest pain.  Patient was recently hospitalized from 7/16 - 7/17 for elective LHC. Pt was s/p of successful direct stenting of distal SVG to PDA. Pt states that he has SOB and productive cough with yellow-colored sputum production in the past several days, which has been progressively worsening.  He also complains of chest pain, which is located in the right side of chest, squeezing pain, moderate, nonradiating, not pleuritic, not aggravated or alleviated by any known factors. No fever or chills.  Patient does not have nausea vomiting, diarrhea or abdominal pain with no symptoms UTI.  He continue to drink alcohol , last drink was earlier today.  Per EMS report, patient was found to have wheezing and 324 mg of aspirin  was given.  Data reviewed independently and ED Course: pt was found to have WBC 8.6, GFR> 60, troponin 44 --> 144, BNP 222,  temperature normal, blood pressure 85/73 which improved to 105/60 without giving fluid, heart rate 113 --> 102, RR 23, oxygen saturation 100% on 2 L oxygen.  Chest x-ray negative.  CTA negative for PE, showed patchy infiltration in the right upper lobe.  Patient is admitted to telemetry bed as inpatient.   CTA: 1. No evidence for pulmonary embolism. 2. New patchy slightly nodular airspace disease in the right upper lobe worrisome for pneumonia. Follow-up CT recommended in 4-6 weeks recommended to ensure resolution and to exclude underlying nodule. 3. Right-sided central peribronchial wall thickening  worrisome for bronchitis. 4. Right hilar and mediastinal lymphadenopathy, likely reactive. 5. Stable left lower lobe pulmonary nodule. 6. Cardiomegaly.   Aortic Atherosclerosis (ICD10-I70.0) and Emphysema (ICD10-J43.9).   EKG: I have personally reviewed.  Sinus rhythm, QTc 463, nonspecific T wave change.   Review of Systems:   General: no fevers, chills, no body weight gain,  has fatigue HEENT: no blurry vision, hearing changes or sore throat Respiratory: has dyspnea, coughing, wheezing CV: has chest pain, no palpitations GI: no nausea, vomiting, abdominal pain, diarrhea, constipation GU: no dysuria, burning on urination, increased urinary frequency, hematuria  Ext: has trace leg edema Neuro: no unilateral weakness, numbness, or tingling, no vision change or hearing loss Skin: no rash, no skin tear. MSK: No muscle spasm, no deformity, no limitation of range of movement in spin Heme: No easy bruising.  Travel history: No recent long distant travel.   Allergy:  Allergies  Allergen Reactions   Wellbutrin  [Bupropion ]     Paranoia     Past Medical History:  Diagnosis Date   COPD (chronic obstructive pulmonary disease) (HCC)    CVA (cerebral infarction)    Headache    mild, since stroke 2012   Hypertension    MI (myocardial infarction) (HCC) 05/09/2012   Reading difficulty    pt reports he reads at about a second grade level   Stroke [facility]) 2012   numbness to left hand    Wears dentures    full upper and lower    Past Surgical History:  Procedure Laterality Date   CAROTID ENDARTERECTOMY  Left    2012 or 2013   CHOLECYSTECTOMY N/A 07/26/2016   Procedure: LAPAROSCOPIC CHOLECYSTECTOMY;  Surgeon: Charlie FORBES Fell, MD;  Location: [facility] ORS;  Service: General;  Laterality: N/A;   COLONOSCOPY N/A 05/08/2024   Procedure: COLONOSCOPY;  Surgeon: Jinny Carmine, MD;  Location: [facility] ENDOSCOPY;  Service: Endoscopy;  Laterality: N/A;   COLONOSCOPY WITH PROPOFOL  N/A 02/09/2022    Procedure: COLONOSCOPY WITH PROPOFOL ;  Surgeon: Jinny Carmine, MD;  Location: [facility] ENDOSCOPY;  Service: Endoscopy;  Laterality: N/A;   CORONARY ARTERY BYPASS GRAFT  05/10/2012   3 vessel   CORONARY STENT INTERVENTION N/A 07/02/2024   Procedure: CORONARY STENT INTERVENTION;  Surgeon: Katina Albright, MD;  Location: [facility] INVASIVE CV LAB;  Service: Cardiovascular;  Laterality: N/A;   CORONARY ULTRASOUND/IVUS N/A 07/02/2024   Procedure: Coronary Ultrasound/IVUS;  Surgeon: Katina Albright, MD;  Location: [facility] INVASIVE CV LAB;  Service: Cardiovascular;  Laterality: N/A;   ENTEROSCOPY N/A 05/16/2023   Procedure: ENTEROSCOPY;  Surgeon: Therisa Bi, MD;  Location: [facility] ENDOSCOPY;  Service: Gastroenterology;  Laterality: N/A;   ENTEROSCOPY N/A 06/01/2023   Procedure: ENTEROSCOPY;  Surgeon: Unk Corinn Skiff, MD;  Location: [facility] ENDOSCOPY;  Service: Gastroenterology;  Laterality: N/A;   ESOPHAGOGASTRODUODENOSCOPY  05/13/2023   Procedure: ESOPHAGOGASTRODUODENOSCOPY (EGD);  Surgeon: Jinny Carmine, MD;  Location: [facility] ENDOSCOPY;  Service: Endoscopy;;   ESOPHAGOGASTRODUODENOSCOPY (EGD) WITH PROPOFOL  N/A 10/28/2022   Procedure: ESOPHAGOGASTRODUODENOSCOPY (EGD) WITH PROPOFOL ;  Surgeon: Onita Elspeth Sharper, DO;  Location: [facility] ENDOSCOPY;  Service: Gastroenterology;  Laterality: N/A;   GIVENS CAPSULE STUDY  05/13/2023   Procedure: GIVENS CAPSULE STUDY;  Surgeon: Jinny Carmine, MD;  Location: [facility] ENDOSCOPY;  Service: Endoscopy;;   GIVENS CAPSULE STUDY  06/01/2023   Procedure: GIVENS CAPSULE STUDY;  Surgeon: Unk Corinn Skiff, MD;  Location: [facility] ENDOSCOPY;  Service: Gastroenterology;;   LEFT HEART CATH AND CORS/GRAFTS ANGIOGRAPHY N/A 07/02/2024   Procedure: LEFT HEART CATH AND CORS/GRAFTS ANGIOGRAPHY;  Surgeon: Katina Albright, MD;  Location: [facility] INVASIVE CV LAB;  Service: Cardiovascular;  Laterality: N/A;    Social History:  reports that he quit smoking about 32 years ago. His smoking use included cigarettes. His smokeless  tobacco use includes snuff. He reports current alcohol  use of about 26.0 standard drinks of alcohol  per week. He reports that he does not use drugs.  Family History:  Family History  Problem Relation Age of Onset   Pneumonia Mother      Prior to Admission medications   Medication Sig Start Date End Date Taking? Authorizing Provider  albuterol  (VENTOLIN  HFA) 108 (90 Base) MCG/ACT inhaler Inhale 2 puffs into the lungs every 6 (six) hours as needed for wheezing or shortness of breath. 07/03/24   Von Bellis, MD  ascorbic acid  (VITAMIN C ) 500 MG tablet Take 1 tablet (500 mg total) by mouth daily. 07/03/24 10/01/24  Von Bellis, MD  aspirin  EC 81 MG tablet Take 1 tablet (81 mg total) by mouth daily. Swallow whole. 07/03/24 12/30/24  Von Bellis, MD  clopidogrel  (PLAVIX ) 75 MG tablet Take 1 tablet (75 mg total) by mouth daily with breakfast. 07/03/24   Von Bellis, MD  dapagliflozin  propanediol (FARXIGA ) 10 MG TABS tablet Take 1 tablet (10 mg total) by mouth daily. 07/03/24   Von Bellis, MD  folic acid  (FOLVITE ) 1 MG tablet Take 1 tablet (1 mg total) by mouth daily. 07/03/24 10/01/24  Von Bellis, MD  iron  polysaccharides (NIFEREX) 150 MG capsule Take 1 capsule (150 mg total) by mouth daily. 07/03/24 10/01/24  Von Bellis, MD  isosorbide  mononitrate (IMDUR ) 60 MG 24 hr tablet Take 1 tablet (60 mg total) by mouth daily. 07/03/24 07/03/25  Von Bellis, MD  losartan  (COZAAR ) 25 MG tablet Take 0.5 tablets (12.5 mg total) by mouth daily. 07/04/24 07/04/25  Von Bellis, MD  metoprolol  succinate (TOPROL -XL) 25 MG 24 hr tablet Take 0.5 tablets (12.5 mg total) by mouth daily. 07/03/24   Von Bellis, MD  pantoprazole  (PROTONIX ) 40 MG tablet Take 1 tablet (40 mg total) by mouth daily. 07/03/24 10/01/24  Von Bellis, MD  pravastatin  (PRAVACHOL ) 20 MG tablet Take 1 tablet (20 mg total) by mouth at bedtime. 07/03/24 07/03/25  Von Bellis, MD  spironolactone  (ALDACTONE ) 25 MG tablet Take 0.5 tablets (12.5  mg total) by mouth daily. 07/03/24   Von Bellis, MD  torsemide  (DEMADEX ) 10 MG tablet Take 1 tablet (10 mg total) by mouth daily. 07/03/24   Von Bellis, MD    Physical Exam: Vitals:   07/11/24 1719 07/11/24 1722 07/11/24 1729 07/11/24 1730  BP:    105/60  Pulse:    (!) 102  Resp:    (!) 23  Temp:   98.1 F (36.7 C)   TempSrc:   Oral   SpO2:  100%  100%  Weight: 95 kg     Height: 6' (1.829 m)      General: Not in acute distress HEENT:       Eyes: PERRL, EOMI, no jaundice       ENT: No discharge from the ears and nose, no pharynx injection, no tonsillar enlargement.        Neck: No JVD, no bruit, no mass felt. Heme: No neck lymph node enlargement. Cardiac: S1/S2, RRR, has aortic systolic murmur, No gallops or rubs. Respiratory: has wheezing bilaterally GI: Soft, nondistended, nontender, no rebound pain, no organomegaly, BS present. GU: No hematuria Ext: has trace leg edema bilaterally. 1+DP/PT pulse bilaterally. Musculoskeletal: No joint deformities, No joint redness or warmth, no limitation of ROM in spin. Skin: No rashes.  Neuro: Alert, oriented X3, cranial nerves II-XII grossly intact, moves all extremities normally. Psych: Patient is not psychotic, no suicidal or hemocidal ideation.  Labs on Admission: I have personally reviewed following labs and imaging studies  CBC: Recent Labs  Lab 07/11/24 1706  WBC 8.6  NEUTROABS 4.9  HGB 8.3*  HCT 26.7*  MCV 86.1  PLT 339   Basic Metabolic Panel: Recent Labs  Lab 07/11/24 1706  NA 135  K 4.3  CL 98  CO2 23  GLUCOSE 102*  BUN 11  CREATININE 0.91  CALCIUM  8.7*   GFR: Estimated Creatinine Clearance: 96.8 mL/min (by C-G formula based on SCr of 0.91 mg/dL). Liver Function Tests: Recent Labs  Lab 07/11/24 1706  AST 21  ALT 16  ALKPHOS 43  BILITOT 0.9  PROT 7.3  ALBUMIN 3.5   No results for input(s): LIPASE, AMYLASE in the last 168 hours. No results for input(s): AMMONIA in the last 168  hours. Coagulation Profile: No results for input(s): INR, PROTIME in the last 168 hours. Cardiac Enzymes: No results for input(s): CKTOTAL, CKMB, CKMBINDEX, TROPONINI in the last 168 hours. BNP (last 3 results) No results for input(s): PROBNP in the last 8760 hours. HbA1C: No results for input(s): HGBA1C in the last 72 hours. CBG: No results for input(s): GLUCAP in the last 168 hours. Lipid Profile: No results for input(s): CHOL, HDL, LDLCALC, TRIG, CHOLHDL, LDLDIRECT in the last 72 hours. Thyroid Function Tests: No results for input(s): TSH, T4TOTAL, FREET4, T3FREE,  THYROIDAB in the last 72 hours. Anemia Panel: No results for input(s): VITAMINB12, FOLATE, FERRITIN, TIBC, IRON , RETICCTPCT in the last 72 hours. Urine analysis:    Component Value Date/Time   COLORURINE YELLOW (A) 06/03/2021 2055   APPEARANCEUR CLOUDY (A) 06/03/2021 2055   APPEARANCEUR Clear 09/09/2014 1708   LABSPEC 1.012 06/03/2021 2055   LABSPEC 1.011 09/09/2014 1708   PHURINE 6.0 06/03/2021 2055   GLUCOSEU 50 (A) 06/03/2021 2055   GLUCOSEU Negative 09/09/2014 1708   HGBUR SMALL (A) 06/03/2021 2055   BILIRUBINUR NEGATIVE 06/03/2021 2055   BILIRUBINUR Negative 09/09/2014 1708   KETONESUR 5 (A) 06/03/2021 2055   PROTEINUR 100 (A) 06/03/2021 2055   NITRITE NEGATIVE 06/03/2021 2055   LEUKOCYTESUR NEGATIVE 06/03/2021 2055   LEUKOCYTESUR Negative 09/09/2014 1708   Sepsis Labs: @LABRCNTIP (procalcitonin:4,lacticidven:4) )No results found for this or any previous visit (from the past 240 hours).   Radiological Exams on Admission:   Assessment/Plan Principal Problem:   HCAP (healthcare-associated pneumonia) Active Problems:   COPD exacerbation (HCC)   Elevated lactic acid level   Chest pain   CAD (coronary artery disease)   Chronic diastolic CHF (congestive heart failure) (HCC)   Essential hypertension   Hyperlipidemia   Stroke (HCC)   Iron  deficiency  anemia   Alcohol  abuse   Cognitive impairment   Assessment and Plan:  HCAP (healthcare-associated pneumonia) and COPD exacerbation (HCC): Patient has productive cough, wheezing, indicating COPD exacerbation.  CTA negative for PE, but showed patchy infiltration in the right upper lobe, indicating pneumonia.  Patient is on home level 2 L oxygen with oxygen saturation 100% currently.  - Will admit to tele bed as inpt - IV Vancomycin  and cefepime  - Incentive spirometry - Solu-Medrol  125 mg, then followed by 80 mg daily - Mucinex  for cough  - Bronchodilators - Urine legionella and S. pneumococcal antigen - Follow up blood culture x2, sputum culture - Check RSEP panel  Elevated lactic acid: Lactic acid level 3.6 --> 5.9.  Patient does not have fever or leukocytosis, clinically does not seem to have sepsis.  Likely due to hypoxia. - IV fluid: 1.5 L of normal saline, then 75 cc/h ( pt has dCHF, limiting aggressive IV fluid treatment) - Trend lactic acid level  Chest pain and CAD (coronary artery disease): s/p of prior CABG and recent Stent placement. Trop  44 --> 144, possibly due to demand ischemia. - Trend troponin - Continue aspirin , Plavix  - Pravastatin  - As needed morphine , Percocet, Tylenol  - prn nitroglycerin  for chest pain - Hold Imdur  due to softer blood pressure - will not start IV heparin  due to Hgb dropping.   Chronic diastolic CHF (congestive heart failure) (HCC): 2D echo 05/06/2024 showed EF of 60 to 65%.  BNP 222. Patient has trace leg edema, no JVD, does not seem to have CHF exacerbation. -hold torsemide , spironolactone  due to softer blood pressure - Watch volume status closely  Essential hypertension -Hold blood pressure medications due to softer blood pressure, including Imdur , losartan , metoprolol , torsemide , spironolactone  - IV hydralazine  as needed  Hyperlipidemia -Pravastatin   Stroke (HCC) -Pravastatin , aspirin , Plavix   Iron  deficiency anemia: Hemoglobin 8.3  (9.8 on 07/03/2024).  Patient had history of GI bleeding. Hgb dropped by 1.5 g.  Patient states that he had dark stool recently, but not in the past several days.  He is now taking iron  supplement. - Continue iron  supplement - check cbc q6h. - INR/PTT/type and screen - start IV protonix  40 mg bid empirically  Alcohol  abuse -Did counsel about  importance of quitting alcohol  use - CIWA protocol  Cognitive impairment: Patient is not taking donepezil .  No behavior disturbance.  Patient is alert oriented x 3. - Fall precaution       DVT ppx: SQ Lovenox   Code Status: Full code   Family Communication:     not done, no family member is at bed side.     Disposition Plan:  Anticipate discharge back to previous environment  Consults called:  none  Admission status and Level of care: Telemetry Cardiac: as inpt        Dispo: The patient is from: Home              Anticipated d/c is to: Home              Anticipated d/c date is: 2 days              Patient currently is not medically stable to d/c.    Severity of Illness:  The appropriate patient status for this patient is INPATIENT. Inpatient status is judged to be reasonable and necessary in order to provide the required intensity of service to ensure the patient's safety. The patient's presenting symptoms, physical exam findings, and initial radiographic and laboratory data in the context of their chronic comorbidities is felt to place them at high risk for further clinical deterioration. Furthermore, it is not anticipated that the patient will be medically stable for discharge from the hospital within 2 midnights of admission.   * I certify that at the point of admission it is my clinical judgment that the patient will require inpatient hospital care spanning beyond 2 midnights from the point of admission due to high intensity of service, high risk for further deterioration and high frequency of surveillance required.*       Date  of Service 07/11/2024    Caleb Exon [facility]   If 7PM-7AM, please contact night-coverage [email or web address] 07/11/2024, 8:57 PM

## 2024-07-12 ENCOUNTER — Inpatient Hospital Stay

## 2024-07-12 DIAGNOSIS — J441 Chronic obstructive pulmonary disease with (acute) exacerbation: Secondary | ICD-10-CM | POA: Diagnosis not present

## 2024-07-12 DIAGNOSIS — I214 Non-ST elevation (NSTEMI) myocardial infarction: Secondary | ICD-10-CM

## 2024-07-12 DIAGNOSIS — R0603 Acute respiratory distress: Secondary | ICD-10-CM | POA: Diagnosis not present

## 2024-07-12 DIAGNOSIS — G928 Other toxic encephalopathy: Secondary | ICD-10-CM | POA: Diagnosis not present

## 2024-07-12 DIAGNOSIS — R7989 Other specified abnormal findings of blood chemistry: Secondary | ICD-10-CM

## 2024-07-12 DIAGNOSIS — E8729 Other acidosis: Secondary | ICD-10-CM

## 2024-07-12 DIAGNOSIS — J181 Lobar pneumonia, unspecified organism: Secondary | ICD-10-CM | POA: Diagnosis not present

## 2024-07-12 DIAGNOSIS — A419 Sepsis, unspecified organism: Secondary | ICD-10-CM

## 2024-07-12 DIAGNOSIS — J9601 Acute respiratory failure with hypoxia: Secondary | ICD-10-CM

## 2024-07-12 DIAGNOSIS — I5A Non-ischemic myocardial injury (non-traumatic): Secondary | ICD-10-CM | POA: Insufficient documentation

## 2024-07-12 DIAGNOSIS — F1090 Alcohol use, unspecified, uncomplicated: Secondary | ICD-10-CM | POA: Diagnosis not present

## 2024-07-12 DIAGNOSIS — I5033 Acute on chronic diastolic (congestive) heart failure: Secondary | ICD-10-CM | POA: Diagnosis not present

## 2024-07-12 DIAGNOSIS — D508 Other iron deficiency anemias: Secondary | ICD-10-CM

## 2024-07-12 LAB — BLOOD GAS, ARTERIAL
Acid-base deficit: 3.6 mmol/L — ABNORMAL HIGH (ref 0.0–2.0)
Bicarbonate: 21.5 mmol/L (ref 20.0–28.0)
O2 Content: 15 L/min
O2 Saturation: 100 %
Patient temperature: 37
pCO2 arterial: 38 mmHg (ref 32–48)
pH, Arterial: 7.36 (ref 7.35–7.45)
pO2, Arterial: 364 mmHg — ABNORMAL HIGH (ref 83–108)

## 2024-07-12 LAB — BASIC METABOLIC PANEL WITH GFR
Anion gap: 19 — ABNORMAL HIGH (ref 5–15)
BUN: 13 mg/dL (ref 8–23)
CO2: 15 mmol/L — ABNORMAL LOW (ref 22–32)
Calcium: 8.6 mg/dL — ABNORMAL LOW (ref 8.9–10.3)
Chloride: 102 mmol/L (ref 98–111)
Creatinine, Ser: 0.94 mg/dL (ref 0.61–1.24)
GFR, Estimated: >60 mL/min (ref >60)
Glucose, Bld: 167 mg/dL — ABNORMAL HIGH (ref 70–99)
Potassium: 4.5 mmol/L (ref 3.5–5.1)
Sodium: 136 mmol/L (ref 135–145)

## 2024-07-12 LAB — STREP PNEUMONIAE URINARY ANTIGEN: Strep Pneumo Urinary Antigen: NEGATIVE

## 2024-07-12 LAB — URINE DRUG SCREEN, QUALITATIVE (ARMC ONLY)
Amphetamines, Ur Screen: NOT DETECTED
Barbiturates, Ur Screen: NOT DETECTED
Benzodiazepine, Ur Scrn: NOT DETECTED
Cannabinoid 50 Ng, Ur ~~LOC~~: NOT DETECTED
Cocaine Metabolite,Ur ~~LOC~~: NOT DETECTED
MDMA (Ecstasy)Ur Screen: NOT DETECTED
Methadone Scn, Ur: NOT DETECTED
Opiate, Ur Screen: POSITIVE — AB
Phencyclidine (PCP) Ur S: NOT DETECTED
Tricyclic, Ur Screen: NOT DETECTED

## 2024-07-12 LAB — HEPATIC FUNCTION PANEL
ALT: 18 U/L (ref 0–44)
AST: 62 U/L — ABNORMAL HIGH (ref 15–41)
Albumin: 3.5 g/dL (ref 3.5–5.0)
Alkaline Phosphatase: 45 U/L (ref 38–126)
Bilirubin, Direct: <0.1 mg/dL (ref 0.0–0.2)
Total Bilirubin: 0.9 mg/dL (ref 0.0–1.2)
Total Protein: 7.3 g/dL (ref 6.5–8.1)

## 2024-07-12 LAB — CBC
HCT: 26 % — ABNORMAL LOW (ref 39.0–52.0)
HCT: 26.6 % — ABNORMAL LOW (ref 39.0–52.0)
HCT: 25.1 % — ABNORMAL LOW (ref 39.0–52.0)
Hemoglobin: 8 g/dL — ABNORMAL LOW (ref 13.0–17.0)
Hemoglobin: 8.2 g/dL — ABNORMAL LOW (ref 13.0–17.0)
Hemoglobin: 7.9 g/dL — ABNORMAL LOW (ref 13.0–17.0)
MCH: 27 pg (ref 26.0–34.0)
MCH: 26.6 pg (ref 26.0–34.0)
MCH: 26.9 pg (ref 26.0–34.0)
MCHC: 30.8 g/dL (ref 30.0–36.0)
MCHC: 30.8 g/dL (ref 30.0–36.0)
MCHC: 31.5 g/dL (ref 30.0–36.0)
MCV: 87.8 fL (ref 80.0–100.0)
MCV: 86.4 fL (ref 80.0–100.0)
MCV: 85.4 fL (ref 80.0–100.0)
Platelets: 293 K/uL (ref 150–400)
Platelets: 328 K/uL (ref 150–400)
Platelets: 271 K/uL (ref 150–400)
RBC: 2.96 MIL/uL — ABNORMAL LOW (ref 4.22–5.81)
RBC: 3.08 MIL/uL — ABNORMAL LOW (ref 4.22–5.81)
RBC: 2.94 MIL/uL — ABNORMAL LOW (ref 4.22–5.81)
RDW: 15.9 % — ABNORMAL HIGH (ref 11.5–15.5)
RDW: 15.9 % — ABNORMAL HIGH (ref 11.5–15.5)
RDW: 15.8 % — ABNORMAL HIGH (ref 11.5–15.5)
WBC: 6.1 K/uL (ref 4.0–10.5)
WBC: 8.2 K/uL (ref 4.0–10.5)
WBC: 6.7 K/uL (ref 4.0–10.5)
nRBC: 0 % (ref 0.0–0.2)
nRBC: 0 % (ref 0.0–0.2)
nRBC: 0 % (ref 0.0–0.2)

## 2024-07-12 LAB — RESPIRATORY PANEL BY PCR

## 2024-07-12 LAB — TROPONIN I (HIGH SENSITIVITY)
Troponin I (High Sensitivity): 980 ng/L (ref <18)
Troponin I (High Sensitivity): 1862 ng/L (ref <18)
Troponin I (High Sensitivity): 5766 ng/L (ref <18)
Troponin I (High Sensitivity): 5756 ng/L (ref <18)

## 2024-07-12 LAB — MAGNESIUM: Magnesium: 1.8 mg/dL (ref 1.7–2.4)

## 2024-07-12 LAB — MRSA NEXT GEN BY PCR, NASAL: MRSA by PCR Next Gen: NOT DETECTED

## 2024-07-12 LAB — BRAIN NATRIURETIC PEPTIDE: B Natriuretic Peptide: 1552.7 pg/mL — ABNORMAL HIGH (ref 0.0–100.0)

## 2024-07-12 LAB — GLUCOSE, CAPILLARY: Glucose-Capillary: 169 mg/dL — ABNORMAL HIGH (ref 70–99)

## 2024-07-12 LAB — RESP PANEL BY RT-PCR (RSV, FLU A&B, COVID)  RVPGX2
Influenza A by PCR: NEGATIVE
Influenza B by PCR: NEGATIVE
Resp Syncytial Virus by PCR: NEGATIVE
SARS Coronavirus 2 by RT PCR: NEGATIVE

## 2024-07-12 LAB — LACTIC ACID, PLASMA: Lactic Acid, Venous: 3.4 mmol/L (ref 0.5–1.9)

## 2024-07-12 MED ORDER — LEVALBUTEROL HCL 0.63 MG/3ML IN NEBU
0.6300 mg | INHALATION_SOLUTION | Freq: Three times a day (TID) | RESPIRATORY_TRACT | Status: DC
  Administered 2024-07-12 (×2): 0.63 mg via RESPIRATORY_TRACT
  Filled 2024-07-12 (×2): qty 3

## 2024-07-12 MED ORDER — PIPERACILLIN-TAZOBACTAM 3.375 G IVPB
3.3750 g | Freq: Three times a day (TID) | INTRAVENOUS | Status: DC
  Administered 2024-07-12 – 2024-07-14 (×6): 3.375 g via INTRAVENOUS
  Filled 2024-07-12 (×6): qty 50

## 2024-07-12 MED ORDER — LOPERAMIDE HCL 2 MG PO CAPS: 2.0000 mg | ORAL_CAPSULE | ORAL | Status: AC | PRN

## 2024-07-12 MED ORDER — FUROSEMIDE 10 MG/ML IJ SOLN
40.0000 mg | Freq: Once | INTRAMUSCULAR | Status: AC
  Administered 2024-07-12: 40 mg via INTRAVENOUS
  Filled 2024-07-12: qty 4

## 2024-07-12 MED ORDER — IPRATROPIUM-ALBUTEROL 0.5-2.5 (3) MG/3ML IN SOLN
3.0000 mL | Freq: Four times a day (QID) | RESPIRATORY_TRACT | Status: DC
  Administered 2024-07-12 – 2024-07-13 (×3): 3 mL via RESPIRATORY_TRACT
  Filled 2024-07-12 (×3): qty 3

## 2024-07-12 MED ORDER — CHLORHEXIDINE GLUCONATE CLOTH 2 % EX PADS
6.0000 | MEDICATED_PAD | Freq: Every day | CUTANEOUS | Status: DC
  Administered 2024-07-12: 6 via TOPICAL

## 2024-07-12 MED ORDER — CHLORDIAZEPOXIDE HCL 5 MG PO CAPS: 25.0000 mg | ORAL_CAPSULE | Freq: Four times a day (QID) | ORAL | Status: DC | PRN

## 2024-07-12 MED ORDER — FUROSEMIDE 10 MG/ML IJ SOLN
40.0000 mg | Freq: Two times a day (BID) | INTRAMUSCULAR | Status: DC
  Administered 2024-07-12 – 2024-07-13 (×3): 40 mg via INTRAVENOUS
  Filled 2024-07-12 (×3): qty 4

## 2024-07-12 MED ORDER — CLOPIDOGREL BISULFATE 75 MG PO TABS
75.0000 mg | ORAL_TABLET | Freq: Every day | ORAL | Status: DC
  Administered 2024-07-12 – 2024-07-15 (×3): 75 mg via ORAL
  Filled 2024-07-12 (×3): qty 1

## 2024-07-12 MED ORDER — DEXMEDETOMIDINE HCL IN NACL 400 MCG/100ML IV SOLN
0.0000 ug/kg/h | INTRAVENOUS | Status: DC
  Administered 2024-07-12: 0.4 ug/kg/h via INTRAVENOUS
  Filled 2024-07-12: qty 100

## 2024-07-12 MED ORDER — HEPARIN BOLUS VIA INFUSION
4000.0000 [IU] | Freq: Once | INTRAVENOUS | Status: AC
  Administered 2024-07-12: 4000 [IU] via INTRAVENOUS
  Filled 2024-07-12: qty 4000

## 2024-07-12 MED ORDER — DEXTROSE-SODIUM CHLORIDE 5-0.9 % IV SOLN: INTRAVENOUS | Status: DC

## 2024-07-12 MED ORDER — HYDROXYZINE HCL 25 MG PO TABS
25.0000 mg | ORAL_TABLET | Freq: Four times a day (QID) | ORAL | Status: AC | PRN
Start: 2024-07-12 — End: 2024-07-15
  Administered 2024-07-13 (×2): 25 mg via ORAL
  Filled 2024-07-12 (×2): qty 1

## 2024-07-12 MED ORDER — METHYLPREDNISOLONE SODIUM SUCC 40 MG IJ SOLR
40.0000 mg | INTRAMUSCULAR | Status: DC
  Administered 2024-07-13 – 2024-07-15 (×3): 40 mg via INTRAVENOUS
  Filled 2024-07-12 (×3): qty 1

## 2024-07-12 MED ORDER — HEPARIN (PORCINE) 25000 UT/250ML-% IV SOLN
1550.0000 [IU]/h | INTRAVENOUS | Status: AC
  Administered 2024-07-12: 1350 [IU]/h via INTRAVENOUS
  Administered 2024-07-13: 1550 [IU]/h via INTRAVENOUS
  Filled 2024-07-12 (×3): qty 250

## 2024-07-12 MED ORDER — ASPIRIN 81 MG PO TBEC
81.0000 mg | DELAYED_RELEASE_TABLET | Freq: Every day | ORAL | Status: DC
  Administered 2024-07-12 – 2024-07-15 (×3): 81 mg via ORAL
  Filled 2024-07-12 (×3): qty 1

## 2024-07-12 MED ORDER — DEXMEDETOMIDINE HCL IN NACL 400 MCG/100ML IV SOLN
INTRAVENOUS | Status: AC
  Filled 2024-07-12: qty 100

## 2024-07-12 NOTE — Care Management Important Message (Signed)
 Important Message  Patient Details  Name: [name] MRN: [id number] Date of Birth: 02-Aug-1961   Important Message Given:  Yes - Medicare IM     [name] 07/12/2024, 1:32 PM

## 2024-07-12 NOTE — Assessment & Plan Note (Signed)
 Alcohol  withdrawal protocol.  May end up needing Precedex 

## 2024-07-12 NOTE — Assessment & Plan Note (Signed)
 Hemoglobin of 8 with a ferritin of 6

## 2024-07-12 NOTE — Assessment & Plan Note (Signed)
 Could be from alcohol  or lactic acidosis.

## 2024-07-12 NOTE — Progress Notes (Signed)
 Pt on bipap with improved symptoms. Pt to be transported to icu 16, report called to [employer]. Questions asked and answered.

## 2024-07-12 NOTE — Assessment & Plan Note (Signed)
 Patient initially given IV fluid.

## 2024-07-12 NOTE — Assessment & Plan Note (Signed)
 Seen on CT scan.  Empiric antibiotics.

## 2024-07-12 NOTE — Progress Notes (Signed)
 Dr Josette at the bedside to see patient. Pt with increased work of breathing noted, on 4L [place], pt is having tremors as well.

## 2024-07-12 NOTE — Progress Notes (Signed)
 Patient transferred via bed with O2 on NRB to icu room 16. Pt tolerated well, respiratory brought bipap. Care turned over to ICU RN.

## 2024-07-12 NOTE — Plan of Care (Signed)
  Problem: Education: Goal: Knowledge of General Education information will improve Description: Including pain rating scale, medication(s)/side effects and non-pharmacologic comfort measures Outcome: Progressing   Problem: Health Behavior/Discharge Planning: Goal: Ability to manage health-related needs will improve Outcome: Progressing   Problem: Clinical Measurements: Goal: Respiratory complications will improve Outcome: Progressing   Problem: Coping: Goal: Level of anxiety will decrease Outcome: Progressing   Problem: Elimination: Goal: Will not experience complications related to bowel motility Outcome: Progressing   Problem: Nutrition: Goal: Adequate nutrition will be maintained Outcome: Not Progressing

## 2024-07-12 NOTE — Assessment & Plan Note (Signed)
 With severe aortic stenosis.  Continue to monitor closely.  IV fluids discontinued and a dose of Lasix  given.

## 2024-07-12 NOTE — Assessment & Plan Note (Signed)
 Continue Solu-Medrol  and Xopenex .  Patient on empiric antibiotics for possibility of pneumonia

## 2024-07-12 NOTE — Progress Notes (Signed)
 Patient appears agitated, sweaty, tremors this AM, noted Troponin elevated significantly to 1800+, O2 requirement this am increasing to 4L, base is 2L, Notified Dr Josette via secure chat and MD states will come see patient first.

## 2024-07-12 NOTE — Progress Notes (Signed)
 Theodore PA notified of elevated trop [phone]. No new orders received. Pt currently on Heparin  drip

## 2024-07-12 NOTE — Assessment & Plan Note (Signed)
 Continue to monitor

## 2024-07-12 NOTE — Progress Notes (Signed)
 Called to patients room with c/o increased sob, placed patient on NRB and patient noted to be grunting and using accessory muscles. Notified Dr Josette, EKG, ABG, Bipap ordered. Notified Respiratory, Dr Josette at bedside to assess patient, NP from ICU to see patient. Pt still having audible wheezing, cardiac NP at bedside to see patient.

## 2024-07-12 NOTE — Assessment & Plan Note (Signed)
 Patient using accessory muscles to breathe.  Patient was placed on BiPAP and transferred to ICU care.  Likely combination of CHF and COPD.

## 2024-07-12 NOTE — Progress Notes (Signed)
 MEWS Progress Note  Patient Details Name: [name] MRN: [id number] DOB: [birth year] Today's Date: 07/12/2024   MEWS Flowsheet Documentation:  Assess: MEWS Score Temp: (!) 97.5 F (36.4 C) BP: 102/76 MAP (mmHg): 98 Pulse Rate: (!) 124 ECG Heart Rate: (!) 122 Resp: (!) 23 Level of Consciousness: Alert SpO2: 98 % O2 Device: Nasal Cannula Patient Activity (if Appropriate): In bed O2 Flow Rate (L/min): 4 L/min FiO2 (%): 28 % Assess: MEWS Score MEWS Temp: 0 MEWS Systolic: 0 MEWS Pulse: 2 MEWS RR: 1 MEWS LOC: 0 MEWS Score: 3 MEWS Score Color: Yellow Assess: SIRS CRITERIA SIRS Temperature : 0 SIRS Respirations : 1 SIRS Pulse: 1 SIRS WBC: 0 SIRS Score Sum : 2 SIRS Temperature : 0 SIRS Pulse: 1 SIRS Respirations : 1 SIRS WBC: 0 SIRS Score Sum : 2 Assess: if the MEWS score is Yellow or Red Were vital signs accurate and taken at a resting state?: Yes Does the patient meet 2 or more of the SIRS criteria?: (P) Yes Does the patient have a confirmed or suspected source of infection?: (P) Yes MEWS guidelines implemented : (P) No, previously yellow, continue vital signs every 4 hours    Dr Josette notified of above and pt's increased CIWA. MD will come see patient first.     [name] 07/12/2024, 7:35 AM

## 2024-07-12 NOTE — Progress Notes (Signed)
 Progress Note   Patient: [name] FMW:[id number] DOB: [birth year] DOA: 07/11/2024     1 DOS: the patient was seen and examined on 07/12/2024   Brief hospital course: 63 y.o. male with medical history significant of  GIB, alcohol  abuse, COPD on 2 L oxygen, hypertension, CAD with prior CABG and recent Stent placement, HLD, stroke, dementia, dCHF, presents with SOB, cough, chest pain.   Patient was recently hospitalized from 7/16 - 7/17 for elective LHC. Pt was s/p of successful direct stenting of distal SVG to PDA. Pt states that he has SOB and productive cough with yellow-colored sputum production in the past several days, which has been progressively worsening.  He also complains of chest pain, which is located in the right side of chest, squeezing pain, moderate, nonradiating, not pleuritic, not aggravated or alleviated by any known factors. No fever or chills.  Patient does not have nausea vomiting, diarrhea or abdominal pain with no symptoms UTI.  He continue to drink alcohol , last drink was earlier today.  Per EMS report, patient was found to have wheezing and 324 mg of aspirin  was given.   Data reviewed independently and ED Course: pt was found to have WBC 8.6, GFR> 60, troponin 44 --> 144, BNP 222,  temperature normal, blood pressure 85/73 which improved to 105/60 without giving fluid, heart rate 113 --> 102, RR 23, oxygen saturation 100% on 2 L oxygen.  Chest x-ray negative.  CTA negative for PE, showed patchy infiltration in the right upper lobe.  Patient is admitted to telemetry bed as inpatient.    CTA: 1. No evidence for pulmonary embolism. 2. New patchy slightly nodular airspace disease in the right upper lobe worrisome for pneumonia. Follow-up CT recommended in 4-6 weeks recommended to ensure resolution and to exclude underlying nodule. 3. Right-sided central peribronchial wall thickening worrisome for bronchitis. 4. Right hilar and mediastinal lymphadenopathy, likely reactive. 5.  Stable left lower lobe pulmonary nodule. 6. Cardiomegaly.   Aortic Atherosclerosis (ICD10-I70.0) and Emphysema (ICD10-J43.9).  7/26.  Called by nursing staff with worsening shortness of breath.  Patient required BiPAP.  Chemistry showing increased anion gap acidosis, troponin elevated at 1862.  ABG shows a pH of 7.36PO2 of 364 PCO2 of 38 and O2 saturation 100% and I was done on a nonrebreather mask.  Chest x-ray ordered.  EKG shows a sinus tachycardia 128 bpm PVCs, inferior T wave abnormality.  Patient will be transferred to the critical care unit, critical care team to take over.  Cardiology consulted.  Aspirin  given.  With history of GI bleed and anemia held off on heparin  drip.  Patient on alcohol  withdrawal protocol.    Assessment and Plan: * Acute respiratory distress Patient using accessory muscles to breathe.  Patient was placed on BiPAP and transferred to ICU care.  Likely combination of CHF and COPD.  COPD exacerbation (HCC) Continue Solu-Medrol  and Xopenex .  Patient on empiric antibiotics for possibility of pneumonia  Lobar pneumonia (HCC) Seen on CT scan.  Empiric antibiotics.  Metabolic acidosis, increased anion gap Could be from alcohol  or lactic acidosis.  Elevated lactic acid level Patient initially given IV fluid.  Acute on chronic diastolic CHF (congestive heart failure) (HCC) With severe aortic stenosis.  Continue to monitor closely.  IV fluids discontinued and a dose of Lasix  given.  Hyperlipidemia On pravastatin   Iron  deficiency anemia Hemoglobin of 8 with a ferritin of 6  Alcohol  abuse Alcohol  withdrawal protocol.  May end up needing Precedex   Cognitive impairment Continue to monitor  Myocardial injury Will give aspirin .  Unable to do heparin  with history of GI bleed and anemia.  Cardiology consultation.  Troponin rise of 1862.        Subjective: Patient feeling short of breath this morning.  About the same as yesterday.  Some cough.  Later I was  called back that the patient was tripoding and using accessory muscles to breathe.  Transferring over to ICU.  Physical Exam: Vitals:   07/12/24 0945 07/12/24 1000 07/12/24 1030 07/12/24 1100  BP: (!) 162/96 (!) 144/97 115/85 125/75  Pulse: (!) 125 (!) 125 (!) 115 (!) 112  Resp: (!) 23 (!) 23 (!) 29 17  Temp:      TempSrc:      SpO2: 99% 98% 98% 99%  Weight:      Height:       Physical Exam HENT:     Head: Normocephalic.     Mouth/Throat:     Pharynx: No oropharyngeal exudate.  Eyes:     General: Lids are normal.     Conjunctiva/sclera: Conjunctivae normal.  Cardiovascular:     Rate and Rhythm: Normal rate and regular rhythm.     Heart sounds: Normal heart sounds, S1 normal and S2 normal.  Pulmonary:     Effort: Accessory muscle usage present.     Breath sounds: Examination of the right-middle field reveals decreased breath sounds and wheezing. Examination of the left-middle field reveals decreased breath sounds and wheezing. Examination of the right-lower field reveals decreased breath sounds and rales. Examination of the left-lower field reveals decreased breath sounds and rales. Decreased breath sounds, wheezing and rales present. No rhonchi.  Abdominal:     Palpations: Abdomen is soft.     Tenderness: There is no abdominal tenderness.  Musculoskeletal:     Right lower leg: No swelling.     Left lower leg: No swelling.  Skin:    General: Skin is warm.     Findings: No rash.  Neurological:     Mental Status: He is alert and oriented to person, place, and time.     Comments: Slight tremor     Data Reviewed: BNP 1552.7 troponin up to 1862, lactic acid 3.4, creatinine 0.94, anion gap 19, hemoglobin 8.0  Disposition: Status is: Inpatient Remains inpatient appropriate because: Patient with worsening respiratory status requiring BiPAP and ICU transfer.  High risk for cardiopulmonary arrest and intubation.  Case discussed with critical care team.  Planned Discharge  Destination: To be determined    Time spent: 35 minutes Patient critically ill and high risk for cardiopulmonary arrest and intubation. Case discussed with cardiology, critical care team and nursing staff.  Author: Charlie Patterson, MD 07/12/2024 11:03 AM  For on call review [email or web address].

## 2024-07-12 NOTE — Assessment & Plan Note (Signed)
 Will give aspirin .  Unable to do heparin  with history of GI bleed and anemia.  Cardiology consultation.  Troponin rise of 1862.

## 2024-07-12 NOTE — Assessment & Plan Note (Signed)
 On pravastatin.

## 2024-07-12 NOTE — Progress Notes (Signed)
 PHARMACY - ANTICOAGULATION CONSULT NOTE  Pharmacy Consult for heparin  infusion Indication: chest pain/ACS  Allergies  Allergen Reactions   Wellbutrin  [Bupropion ]     Paranoia     Patient Measurements: Height: 6' (182.9 cm) Weight: 100.1 kg (220 lb 10.9 oz) IBW/kg (Calculated) : 77.6 HEPARIN  DW (KG): 95  Vital Signs: Temp: 98.2 F (36.8 C) (07/26 1200) Temp Source: Axillary (07/26 1200) BP: 104/66 (07/26 1400) Pulse Rate: 95 (07/26 1400)  Labs: Recent Labs    07/11/24 1706 07/11/24 1951 07/11/24 2137 07/12/24 0215 07/12/24 0532 07/12/24 0900 07/12/24 1439  HGB 8.3*  --  8.2* 8.0*  --  8.2* 7.9*  HCT 26.7*  --  26.4* 26.0*  --  26.6* 25.1*  PLT 339  --  296 293  --  328 271  APTT 34  --   --   --   --   --   --   LABPROT 15.3*  --   --   --   --   --   --   INR 1.1  --   --   --   --   --   --   CREATININE 0.91  --   --  0.94  --   --   --   TROPONINIHS 44*   < > 205* 980* 1,862*  --   --    < > = values in this interval not displayed.    Estimated Creatinine Clearance: 96 mL/min (by C-G formula based on SCr of 0.94 mg/dL).   Medical History: Past Medical History:  Diagnosis Date   COPD (chronic obstructive pulmonary disease) (HCC)    CVA (cerebral infarction)    Headache    mild, since stroke 2012   Hypertension    MI (myocardial infarction) (HCC) 05/09/2012   Reading difficulty    pt reports he reads at about a second grade level   Stroke (HCC) 2012   numbness to left hand    Wears dentures    full upper and lower    Medications:  Scheduled:   ascorbic acid   500 mg Oral Daily   aspirin  EC  81 mg Oral Daily   Chlorhexidine  Gluconate Cloth  6 each Topical Q0600   clopidogrel   75 mg Oral Daily   folic acid   1 mg Oral Daily   furosemide   40 mg Intravenous BID   ipratropium-albuterol   3 mL Nebulization Q6H   iron  polysaccharides  150 mg Oral Daily   [START ON 07/13/2024] methylPREDNISolone  (SOLU-MEDROL ) injection  40 mg Intravenous Q24H    multivitamin with minerals  1 tablet Oral Daily   pantoprazole  (PROTONIX ) IV  40 mg Intravenous Q12H   pravastatin   20 mg Oral QHS   thiamine   100 mg Oral Daily   Or   thiamine   100 mg Intravenous Daily   Infusions:   ceFEPime  (MAXIPIME ) IV 2 g (07/12/24 1409)   dexmedetomidine  (PRECEDEX ) IV infusion 0.4 mcg/kg/hr (07/12/24 0947)   vancomycin  1,250 mg (07/12/24 9180)    Assessment: 63 y.o. male with medical history significant for  CAD S/P CABG, chronic HFpEF, severe aortic stenosis, HTN, HLD, obesity, COPD, recent GI bleed, alcohol  use disorder presented to [facility] for SOB, cough, chest pain. A review of medical records reveals no chronic anticoagulation prior to arrival  Baseline Labs: Hgb 7.9, PLT wnl, INR 1.1, aPTT 34s  Goal of Therapy:  anti-Xa level  0.3-0.7 units/ml Monitor platelets by anticoagulation protocol: Yes   Plan:  ---will give 4000  units bolus x 1 ---Start heparin  infusion at 1350 units/hr ---Check anti-Xa level in 6 hours and at least once daily while on heparin  ---Continue to monitor H&H and platelets  Adriana JONETTA Bolster 07/12/2024,2:55 PM

## 2024-07-12 NOTE — Consult Note (Signed)
 NAME:  [name], MRN:  [id number], DOB:  1961-01-20, LOS: 1 ADMISSION DATE:  07/11/2024, CONSULTATION DATE:  07/12/2024 REFERRING MD:  Charlie Patterson, MD, CHIEF COMPLAINT:  Respiratory Failure   History of Present Illness:   Patient is a 63 year old male who was admitted to the hospital on 7/25 after presenting with shortness of breath and chest discomfort.  He has a known history of severe aortic stenosis as well as coronary artery disease.  He is previously had CABG as well as PCI.  He also has underlying COPD, HFpEF, alcohol  use disorder, and GI bleed.  Patient was recently in the hospital on 7/16 - 7/17 for left heart cath with stenting to bypass graft (SVG to PDA).  He developed shortness of breath with cough productive of sputum over the past few days prompting presentation to the ED yesterday.  On arrival to the ED, his lower extremities were swollen and he was found to have a wheeze.  He was hypoxic requiring oxygen support via nasal cannula.  Given respiratory distress, he was started on treatment for COPD exacerbation and admitted to the hospitalist service for further management.  CT scan of the chest was negative for PE but did note some patchy nodular airspace disease as well as airway thickening.  He was started on broad-spectrum IV antibiotics, steroids, bronchodilators, and received IV fluids for sepsis given elevated lactic acid.  This morning, he had increased shortness of breath with respiratory distress prompting initiation of BiPAP and transferred to the ICU.  He was also started on dexmedetomidine  for agitation.  Blood work showed normal kidney function, but with rising troponin and a significantly elevated BNP. Hemoglobin has been stable.  Pertinent  Medical History   -CAD s/p CABG -HFpEF -Severe Aortic Stenosis -EtOH use -COPD on Trelegy  Significant Hospital Events: Including procedures, antibiotic start and stop dates in addition to other pertinent events    7/25: admit with respiratory failure 7/26: clinically decompensated, worsening respiratory status initiated on BiPAP  Interim History / Subjective:  Awake and alert, follows commands. Denies pain  Objective    Blood pressure (!) 168/95, pulse (!) 127, temperature (!) 96.6 F (35.9 C), temperature source Axillary, resp. rate (!) 22, height 6' (1.829 m), weight 100.1 kg, SpO2 100%.    FiO2 (%):  [28 %-40 %] 40 %   Intake/Output Summary (Last 24 hours) at 07/12/2024 0948 Last data filed at 07/12/2024 0749 Gross per 24 hour  Intake 990 ml  Output 1200 ml  Net -210 ml   Filed Weights   07/11/24 1719 07/12/24 0931  Weight: 95 kg 100.1 kg    Examination: Physical Exam Constitutional:      General: He is not in acute distress.    Appearance: He is not ill-appearing.  Cardiovascular:     Rate and Rhythm: Normal rate and regular rhythm.     Pulses: Normal pulses.     Heart sounds: Normal heart sounds.  Pulmonary:     Effort: Pulmonary effort is normal.     Breath sounds: No wheezing.     Comments: Decreased air entry bilaterally Abdominal:     Palpations: Abdomen is soft.  Neurological:     General: No focal deficit present.     Mental Status: He is alert. Mental status is at baseline.     Motor: Weakness present.     Assessment and Plan   Neurology #Toxic Metabolic Encephalopathy #EtOH use  This is in the setting of critical illness and pulmonary  edema. Improved with diuresis and BiPAP, and now on dexmedetomidine  which we will attempt to wean off. Has a history of EtOH use and is at risk for withdrawal, on CIWA protocol.1  -wean precedex  -CIWA protocol  Cardiovascular #Acute Decompensated HFpEF #NSTEMI #Sepsis #Severe Aortic Stenosis #CAD  History of CAD, s/p CABG; also with recent LHC with stenting of one of the saphenous venous grafts. Presents with respiratory failure and developed decompensated heart failure and NSTEMI. He is known to have severe aortic  stenosis and was undergoing workup for this prior to this admission. Will continue to trend troponins and initiate heparin  gtt for NSTEMI. He ultimately needs an evaluation with a structural heart specialist for consideration of TAVR.  -heparin  gtt for NSTEMI -continue to trend troponin -IV diuresis with 40 of furosemide  IV bid -cardiology consulted  Pulmonary #Acute Hypoxic Respiratory Failure #COPD Exacerbation #Pulmonary Edema  His respiratory failure is driven by COPD exacerbation (noted emphysema and airway thickening on CT) as well as decompensated heart failure given volume resuscitation, aortic stenosis, and elevated BNP. Now on BiPAP and diuresed, with improvement.  -duonebs every 6 hours -switch steroids to methylpred 40 mg daily -continue BiPAP, wean as tolerated -continue IV diuresis  Gastrointestinal #AVM  Known to have small bowel AVM (association with AS). He is on bid PPI given concern for GI bleed, which we will continue. Maintained NPO for now. Has hepatic steatosis but not known to have liver cirrhosis, and LFT's are within normal on presentation.  -continue IV PPI bid  Renal #Metabolic Acidosis  #Lactic Acidosis  This is in the setting of COPD exacerbation and possible infectious process. Lactic acid is improved and clearing. Holding IV fluids given decompensated heart failure. Kidney function at baseline.  -40 mg IV furosemide  bid  Endocrine  ICU glycemic protocol  Hem/Onc  Hemoglobin, while lower than prior, is improved. He has history of AVM's which is associated with aortic stenosis. Will re-initiate heparin  gtt and continue to monitor hemoglobin..  -heparin  gtt -H/H bid -goal hemoglobin of 8 given cardiac history, AS, and NSTEMI  ID #COPD Exacerbation #Sepsis #RUL Pneumonia  Initiated on broad spectrum antibiotics given elevated lactic acid and concern for sepsis secondary to RUL pneumonia.   -switch to Zosyn , d/c vanc (MRSA  negative) -cultures pending  Best Practice (right click and Reselect all SmartList Selections daily)   Diet/type: NPO DVT prophylaxis systemic heparin  Pressure ulcer(s): N/A GI prophylaxis: PPI Lines: N/A Foley:  Yes, and it is still needed Code Status:  full code Last date of multidisciplinary goals of care discussion [07/12/2024]  Labs   CBC: Recent Labs  Lab 07/11/24 1706 07/11/24 2137 07/12/24 0215 07/12/24 0900  WBC 8.6 6.2 6.1 8.2  NEUTROABS 4.9  --   --   --   HGB 8.3* 8.2* 8.0* 8.2*  HCT 26.7* 26.4* 26.0* 26.6*  MCV 86.1 86.8 87.8 86.4  PLT 339 296 293 328    Basic Metabolic Panel: Recent Labs  Lab 07/11/24 1706 07/12/24 0215 07/12/24 0900  NA 135 136  --   K 4.3 4.5  --   CL 98 102  --   CO2 23 15*  --   GLUCOSE 102* 167*  --   BUN 11 13  --   CREATININE 0.91 0.94  --   CALCIUM  8.7* 8.6*  --   MG  --   --  1.8   GFR: Estimated Creatinine Clearance: 96 mL/min (by C-G formula based on SCr of 0.94 mg/dL). Recent Labs  Lab 07/11/24 1706 07/11/24 1951 07/11/24 2137 07/12/24 0215 07/12/24 0900  WBC 8.6  --  6.2 6.1 8.2  LATICACIDVEN  --  3.6* 5.9*  --   --     Liver Function Tests: Recent Labs  Lab 07/11/24 1706 07/12/24 0900  AST 21 62*  ALT 16 18  ALKPHOS 43 45  BILITOT 0.9 0.9  PROT 7.3 7.3  ALBUMIN 3.5 3.5   No results for input(s): LIPASE, AMYLASE in the last 168 hours. No results for input(s): AMMONIA in the last 168 hours.  ABG    Component Value Date/Time   PHART 7.36 07/12/2024 0814   PCO2ART 38 07/12/2024 0814   PO2ART 364 (H) 07/12/2024 0814   HCO3 21.5 07/12/2024 0814   ACIDBASEDEF 3.6 (H) 07/12/2024 0814   O2SAT 100 07/12/2024 0814     Coagulation Profile: Recent Labs  Lab 07/11/24 1706  INR 1.1    Cardiac Enzymes: No results for input(s): CKTOTAL, CKMB, CKMBINDEX, TROPONINI in the last 168 hours.  HbA1C: Hemoglobin A1C  Date/Time Value Ref Range Status  12/02/2013 03:52 PM 5.3 4.2 - 6.3 %  Final    Comment:    The American Diabetes Association recommends that a primary goal of therapy should be <7% and that physicians should reevaluate the treatment regimen in patients with HbA1c values consistently >8%.    Hgb A1c MFr Bld  Date/Time Value Ref Range Status  10/28/2022 04:37 AM 5.3 4.8 - 5.6 % Final    Comment:    (NOTE) Pre diabetes:          5.7%-6.4%  Diabetes:              >6.4%  Glycemic control for   <7.0% adults with diabetes     CBG: Recent Labs  Lab 07/12/24 0933  GLUCAP 169*    Review of Systems:   N/A  Past Medical History:  He,  has a past medical history of COPD (chronic obstructive pulmonary disease) (HCC), CVA (cerebral infarction), Headache, Hypertension, MI (myocardial infarction) (HCC) (05/09/2012), Reading difficulty, Stroke (HCC) (2012), and Wears dentures.   Surgical History:   Past Surgical History:  Procedure Laterality Date   CAROTID ENDARTERECTOMY Left    2012 or 2013   CHOLECYSTECTOMY N/A 07/26/2016   Procedure: LAPAROSCOPIC CHOLECYSTECTOMY;  Surgeon: Charlie FORBES Fell, MD;  Location: [facility] ORS;  Service: General;  Laterality: N/A;   COLONOSCOPY N/A 05/08/2024   Procedure: COLONOSCOPY;  Surgeon: Jinny Carmine, MD;  Location: [facility] ENDOSCOPY;  Service: Endoscopy;  Laterality: N/A;   COLONOSCOPY WITH PROPOFOL  N/A 02/09/2022   Procedure: COLONOSCOPY WITH PROPOFOL ;  Surgeon: Jinny Carmine, MD;  Location: [facility] ENDOSCOPY;  Service: Endoscopy;  Laterality: N/A;   CORONARY ARTERY BYPASS GRAFT  05/10/2012   3 vessel   CORONARY STENT INTERVENTION N/A 07/02/2024   Procedure: CORONARY STENT INTERVENTION;  Surgeon: Katina Albright, MD;  Location: [facility] INVASIVE CV LAB;  Service: Cardiovascular;  Laterality: N/A;   CORONARY ULTRASOUND/IVUS N/A 07/02/2024   Procedure: Coronary Ultrasound/IVUS;  Surgeon: Katina Albright, MD;  Location: [facility] INVASIVE CV LAB;  Service: Cardiovascular;  Laterality: N/A;   ENTEROSCOPY N/A 05/16/2023   Procedure: ENTEROSCOPY;   Surgeon: Therisa Bi, MD;  Location: [facility] ENDOSCOPY;  Service: Gastroenterology;  Laterality: N/A;   ENTEROSCOPY N/A 06/01/2023   Procedure: ENTEROSCOPY;  Surgeon: Unk Corinn Skiff, MD;  Location: [facility] ENDOSCOPY;  Service: Gastroenterology;  Laterality: N/A;   ESOPHAGOGASTRODUODENOSCOPY  05/13/2023   Procedure: ESOPHAGOGASTRODUODENOSCOPY (EGD);  Surgeon: Jinny Carmine, MD;  Location: [facility] ENDOSCOPY;  Service: Endoscopy;;   ESOPHAGOGASTRODUODENOSCOPY (EGD) WITH PROPOFOL  N/A 10/28/2022   Procedure: ESOPHAGOGASTRODUODENOSCOPY (EGD) WITH PROPOFOL ;  Surgeon: Onita Elspeth Sharper, DO;  Location: [facility] ENDOSCOPY;  Service: Gastroenterology;  Laterality: N/A;   GIVENS CAPSULE STUDY  05/13/2023   Procedure: GIVENS CAPSULE STUDY;  Surgeon: Jinny Carmine, MD;  Location: [facility] ENDOSCOPY;  Service: Endoscopy;;   GIVENS CAPSULE STUDY  06/01/2023   Procedure: GIVENS CAPSULE STUDY;  Surgeon: Unk Corinn Skiff, MD;  Location: [facility] ENDOSCOPY;  Service: Gastroenterology;;   LEFT HEART CATH AND CORS/GRAFTS ANGIOGRAPHY N/A 07/02/2024   Procedure: LEFT HEART CATH AND CORS/GRAFTS ANGIOGRAPHY;  Surgeon: Katina Albright, MD;  Location: [facility] INVASIVE CV LAB;  Service: Cardiovascular;  Laterality: N/A;     Social History:   reports that he quit smoking about 32 years ago. His smoking use included cigarettes. His smokeless tobacco use includes snuff. He reports current alcohol  use of about 26.0 standard drinks of alcohol  per week. He reports that he does not use drugs.   Family History:  His family history includes Pneumonia in his mother.   Allergies Allergies  Allergen Reactions   Wellbutrin  [Bupropion ]     Paranoia      Home Medications  Prior to Admission medications   Medication Sig Start Date End Date Taking? Authorizing Provider  albuterol  (VENTOLIN  HFA) 108 (90 Base) MCG/ACT inhaler Inhale 2 puffs into the lungs every 6 (six) hours as needed for wheezing or shortness of breath. 07/03/24  Yes Von Bellis, MD   ascorbic acid  (VITAMIN C ) 500 MG tablet Take 1 tablet (500 mg total) by mouth daily. 07/03/24 10/01/24 Yes Von Bellis, MD  aspirin  EC 81 MG tablet Take 1 tablet (81 mg total) by mouth daily. Swallow whole. 07/03/24 12/30/24 Yes Von Bellis, MD  clopidogrel  (PLAVIX ) 75 MG tablet Take 1 tablet (75 mg total) by mouth daily with breakfast. 07/03/24  Yes Von Bellis, MD  dapagliflozin  propanediol (FARXIGA ) 10 MG TABS tablet Take 1 tablet (10 mg total) by mouth daily. 07/03/24  Yes Von Bellis, MD  folic acid  (FOLVITE ) 1 MG tablet Take 1 tablet (1 mg total) by mouth daily. 07/03/24 10/01/24 Yes Von Bellis, MD  iron  polysaccharides (NIFEREX) 150 MG capsule Take 1 capsule (150 mg total) by mouth daily. 07/03/24 10/01/24 Yes Von Bellis, MD  isosorbide  mononitrate (IMDUR ) 60 MG 24 hr tablet Take 1 tablet (60 mg total) by mouth daily. 07/03/24 07/03/25 Yes Von Bellis, MD  losartan  (COZAAR ) 25 MG tablet Take 0.5 tablets (12.5 mg total) by mouth daily. 07/04/24 07/04/25 Yes Von Bellis, MD  metoprolol  succinate (TOPROL -XL) 25 MG 24 hr tablet Take 0.5 tablets (12.5 mg total) by mouth daily. 07/03/24  Yes Von Bellis, MD  pantoprazole  (PROTONIX ) 40 MG tablet Take 1 tablet (40 mg total) by mouth daily. 07/03/24 10/01/24 Yes Von Bellis, MD  pravastatin  (PRAVACHOL ) 20 MG tablet Take 1 tablet (20 mg total) by mouth at bedtime. 07/03/24 07/03/25 Yes Von Bellis, MD  spironolactone  (ALDACTONE ) 25 MG tablet Take 0.5 tablets (12.5 mg total) by mouth daily. 07/03/24  Yes Von Bellis, MD  torsemide  (DEMADEX ) 10 MG tablet Take 1 tablet (10 mg total) by mouth daily. 07/03/24  Yes Von Bellis, MD     The patient is critically ill due to respiratory failure, sepsis, aortic stenosis.  Critical care was necessary to treat or prevent imminent or life-threatening deterioration. Critical care time was spent by me on the following activities: development of a treatment plan with the patient and/or surrogate as well as  nursing, discussions with consultants, evaluation of the patient's response to treatment, examination of the patient, obtaining a history from the patient or surrogate, ordering and performing treatments and interventions, ordering and review of laboratory studies, ordering and review of radiographic studies, review of telemetry data including pulse oximetry, re-evaluation of patient's condition and participation in multidisciplinary rounds.   I personally spent 58 minutes providing critical care not including any separately billable procedures.   Belva November, MD [place] [facility] 07/12/2024 2:59 PM

## 2024-07-12 NOTE — Hospital Course (Addendum)
 63 y.o. male with medical history significant of  GIB, alcohol  abuse, COPD on 2 L oxygen, hypertension, CAD with prior CABG and recent Stent placement, HLD, stroke, dementia, dCHF, presents with SOB, cough, chest pain.   Patient was recently hospitalized from 7/16 - 7/17 for elective LHC. Pt was s/p of successful direct stenting of distal SVG to PDA. Pt states that he has SOB and productive cough with yellow-colored sputum production in the past several days, which has been progressively worsening.  He also complains of chest pain, which is located in the right side of chest.  CTA negative for PE, showed patchy infiltration in the right upper lobe.  Patient is admitted to telemetry bed as inpatient.

## 2024-07-12 NOTE — Consult Note (Signed)
 [facility] CLINIC CARDIOLOGY CONSULT NOTE       Patient ID: [name] MRN: [id number] DOB/AGE: November 18, 1961 63 y.o.  Admit date: 07/11/2024 Referring Physician Dr. Charlie Sellar Primary Physician Osa Geralds, NP  Primary Cardiologist Tinnie Maiden, NP  Reason for Consultation elevated troponins  HPI: [name] is a 63 y.o. 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 Found to have pneumonia, overnight troponins uptrending. Cardiology was consulted for further evaluation.   Patient reports he had worsening shortness of breath and cough for several days prior to coming to the ED.  Reported that cough was productive with yellow sputum.  Also endorsed chest discomfort across his chest and into both of his shoulders and down both arms which was worse yesterday when he first came to the hospital and now has improved.  Workup in the ED notable for creatinine 0.91, potassium 4.3, hemoglobin 8.3, WBC 8.6.  BNP mildly elevated at 222, overall improved from prior hospitalization.  Lactic acid initially elevated at 3.6 and uptrending to 5.9.  Blood gas with PCO242 and PO2 of 81, pH 7.39.  Chest x-ray and CT chest in the ED concerning for pneumonia, COPD exacerbation.  Started on nebulizer treatments and antibiotics and transferred to the floor.  This morning he developed increased work of breathing and worsening shortness of breath and was placed on nonrebreather, ultimately transition to BiPAP.  Repeat BNP found to be elevated at greater than 1500.  Troponins also checked and have trended 44 > 144 > 205 > 980 > 1862.  EKG reveals sinus tachycardia with ST depression,  otherwise no acute ischemic changes.  At the time my evaluation this morning he is sitting upright in hospital bed with critical care nurse practitioner and nurse at bedside.  He was noted to have increased work of breathing and worsening shortness of breath with accessory muscle use and thus it was decided to start him on BiPAP.  He states that the chest pain he was experiencing yesterday has now resolved.  Denies any significant lower extremity edema prior to coming to the hospital.  He will be given a dose of IV Lasix  as well.  Review of systems complete and found to be negative unless listed above    Past Medical History:  Diagnosis Date   COPD (chronic obstructive pulmonary disease) (HCC)    CVA (cerebral infarction)    Headache    mild, since stroke 2012   Hypertension    MI (myocardial infarction) (HCC) 05/09/2012   Reading difficulty    pt reports he reads at about a second grade level   Stroke [facility]) 2012   numbness to left hand    Wears dentures    full upper and lower    Past Surgical History:  Procedure Laterality Date   CAROTID ENDARTERECTOMY Left    2012 or 2013   CHOLECYSTECTOMY N/A 07/26/2016   Procedure: LAPAROSCOPIC CHOLECYSTECTOMY;  Surgeon: Charlie FORBES Fell, MD;  Location: [facility] ORS;  Service: General;  Laterality: N/A;   COLONOSCOPY N/A 05/08/2024   Procedure: COLONOSCOPY;  Surgeon: Jinny Carmine, MD;  Location: [facility] ENDOSCOPY;  Service: Endoscopy;  Laterality: N/A;   COLONOSCOPY WITH PROPOFOL   N/A 02/09/2022   Procedure: COLONOSCOPY WITH PROPOFOL ;  Surgeon: Jinny Carmine, MD;  Location: [facility] ENDOSCOPY;  Service: Endoscopy;  Laterality: N/A;   CORONARY ARTERY BYPASS GRAFT  05/10/2012   3 vessel   CORONARY STENT INTERVENTION N/A 07/02/2024   Procedure: CORONARY STENT INTERVENTION;  Surgeon: Katina Albright, MD;  Location: [facility] INVASIVE CV LAB;  Service: Cardiovascular;  Laterality: N/A;   CORONARY ULTRASOUND/IVUS N/A 07/02/2024   Procedure: Coronary Ultrasound/IVUS;   Surgeon: Katina Albright, MD;  Location: [facility] INVASIVE CV LAB;  Service: Cardiovascular;  Laterality: N/A;   ENTEROSCOPY N/A 05/16/2023   Procedure: ENTEROSCOPY;  Surgeon: Therisa Bi, MD;  Location: [facility] ENDOSCOPY;  Service: Gastroenterology;  Laterality: N/A;   ENTEROSCOPY N/A 06/01/2023   Procedure: ENTEROSCOPY;  Surgeon: Unk Corinn Skiff, MD;  Location: [facility] ENDOSCOPY;  Service: Gastroenterology;  Laterality: N/A;   ESOPHAGOGASTRODUODENOSCOPY  05/13/2023   Procedure: ESOPHAGOGASTRODUODENOSCOPY (EGD);  Surgeon: Jinny Carmine, MD;  Location: [facility] ENDOSCOPY;  Service: Endoscopy;;   ESOPHAGOGASTRODUODENOSCOPY (EGD) WITH PROPOFOL  N/A 10/28/2022   Procedure: ESOPHAGOGASTRODUODENOSCOPY (EGD) WITH PROPOFOL ;  Surgeon: Onita Elspeth Sharper, DO;  Location: [facility] ENDOSCOPY;  Service: Gastroenterology;  Laterality: N/A;   GIVENS CAPSULE STUDY  05/13/2023   Procedure: GIVENS CAPSULE STUDY;  Surgeon: Jinny Carmine, MD;  Location: [facility] ENDOSCOPY;  Service: Endoscopy;;   GIVENS CAPSULE STUDY  06/01/2023   Procedure: GIVENS CAPSULE STUDY;  Surgeon: Unk Corinn Skiff, MD;  Location: [facility] ENDOSCOPY;  Service: Gastroenterology;;   LEFT HEART CATH AND CORS/GRAFTS ANGIOGRAPHY N/A 07/02/2024   Procedure: LEFT HEART CATH AND CORS/GRAFTS ANGIOGRAPHY;  Surgeon: Katina Albright, MD;  Location: [facility] INVASIVE CV LAB;  Service: Cardiovascular;  Laterality: N/A;    Medications Prior to Admission  Medication Sig Dispense Refill Last Dose/Taking   albuterol  (VENTOLIN  HFA) 108 (90 Base) MCG/ACT inhaler Inhale 2 puffs into the lungs every 6 (six) hours as needed for wheezing or shortness of breath. 6.7 g 2 07/11/2024 Morning   ascorbic acid  (VITAMIN C ) 500 MG tablet Take 1 tablet (500 mg total) by mouth daily. 30 tablet 2 07/11/2024 Morning   aspirin  EC 81 MG tablet Take 1 tablet (81 mg total) by mouth daily. Swallow whole. 30 tablet 5 07/11/2024 Morning   clopidogrel  (PLAVIX ) 75 MG tablet Take 1 tablet (75 mg total) by mouth daily with  breakfast. 30 tablet 11 07/11/2024 Morning   dapagliflozin  propanediol (FARXIGA ) 10 MG TABS tablet Take 1 tablet (10 mg total) by mouth daily. 30 tablet 11 07/11/2024 Morning   folic acid  (FOLVITE ) 1 MG tablet Take 1 tablet (1 mg total) by mouth daily. 30 tablet 2 07/11/2024   iron  polysaccharides (NIFEREX) 150 MG capsule Take 1 capsule (150 mg total) by mouth daily. 30 capsule 2 07/11/2024   isosorbide  mononitrate (IMDUR ) 60 MG 24 hr tablet Take 1 tablet (60 mg total) by mouth daily. 30 tablet 11 07/11/2024   losartan  (COZAAR ) 25 MG tablet Take 0.5 tablets (12.5 mg total) by mouth daily. 30 tablet 11 07/11/2024 Morning   metoprolol  succinate (TOPROL -XL) 25 MG 24 hr tablet Take 0.5 tablets (12.5 mg total) by mouth daily. 30 tablet 11 07/11/2024 Morning   pantoprazole  (PROTONIX ) 40 MG tablet Take 1 tablet (40 mg total) by mouth daily. 30 tablet 2 07/11/2024 Morning   pravastatin  (PRAVACHOL ) 20 MG tablet Take 1 tablet (20 mg total) by mouth at bedtime. 30 tablet 11 07/10/2024 Evening   spironolactone  (ALDACTONE ) 25 MG tablet Take 0.5 tablets (12.5 mg total) by mouth daily. 30 tablet 11 07/11/2024 Morning   torsemide  (  DEMADEX ) 10 MG tablet Take 1 tablet (10 mg total) by mouth daily. 30 tablet 11 07/11/2024   Social History   Socioeconomic History   Marital status: Divorced    Spouse name: Not on file   Number of children: Not on file   Years of education: Not on file   Highest education level: Not on file  Occupational History   Not on file  Tobacco Use   Smoking status: Former    Current packs/day: 0.00    Types: Cigarettes    Quit date: 74    Years since quitting: 32.5   Smokeless tobacco: Current    Types: Snuff   Tobacco comments:    changed to dip  Vaping Use   Vaping status: Never Used  Substance and Sexual Activity   Alcohol  use: Yes    Alcohol /week: 26.0 standard drinks of alcohol     Types: 26 Cans of beer per week   Drug use: No   Sexual activity: Yes    Birth control/protection:  None  Other Topics Concern   Not on file  Social History Narrative   Not on file   Social Drivers of Health   Financial Resource Strain: Not on file  Food Insecurity: No Food Insecurity (07/12/2024)   Hunger Vital Sign    Worried About Running Out of Food in the Last Year: Never true    Ran Out of Food in the Last Year: Never true  Transportation Needs: No Transportation Needs (07/12/2024)   PRAPARE - Administrator, Civil Service (Medical): No    Lack of Transportation (Non-Medical): No  Physical Activity: Not on file  Stress: Not on file  Social Connections: Socially Isolated (07/12/2024)   Social Connection and Isolation Panel    Frequency of Communication with Friends and Family: Once a week    Frequency of Social Gatherings with Friends and Family: Once a week    Attends Religious Services: Never    Database administrator or Organizations: No    Attends Banker Meetings: Never    Marital Status: Divorced  Catering manager Violence: Not At Risk (07/12/2024)   Humiliation, Afraid, Rape, and Kick questionnaire    Fear of Current or Ex-Partner: No    Emotionally Abused: No    Physically Abused: No    Sexually Abused: No    Family History  Problem Relation Age of Onset   Pneumonia Mother      Vitals:   07/12/24 0945 07/12/24 1000 07/12/24 1030 07/12/24 1100  BP: (!) 162/96 (!) 144/97 115/85 125/75  Pulse: (!) 125 (!) 125 (!) 115 (!) 112  Resp: (!) 23 (!) 23 (!) 29 17  Temp:      TempSrc:      SpO2: 99% 98% 98% 99%  Weight:      Height:        PHYSICAL EXAM General: Ill appearing male, well nourished, in no acute distress. HEENT: Normocephalic and atraumatic. Neck: No JVD.  Lungs: Normal respiratory effort on NRB. Bibasilar crackles.  Heart: HRRR, elevated rate. Normal S1 and S2 without gallops. +systolic ejection murmur.   Abdomen: Non-distended appearing.  Msk: Normal strength and tone for age. Extremities: Warm and well perfused. No  clubbing, cyanosis. Trace edema.  Neuro: Alert and oriented X 3. Psych: Answers questions appropriately.   Labs: Basic Metabolic Panel: Recent Labs    07/11/24 1706 07/12/24 0215 07/12/24 0900  NA 135 136  --   K 4.3 4.5  --  CL 98 102  --   CO2 23 15*  --   GLUCOSE 102* 167*  --   BUN 11 13  --   CREATININE 0.91 0.94  --   CALCIUM  8.7* 8.6*  --   MG  --   --  1.8   Liver Function Tests: Recent Labs    07/11/24 1706 07/12/24 0900  AST 21 62*  ALT 16 18  ALKPHOS 43 45  BILITOT 0.9 0.9  PROT 7.3 7.3  ALBUMIN 3.5 3.5   No results for input(s): LIPASE, AMYLASE in the last 72 hours. CBC: Recent Labs    07/11/24 1706 07/11/24 2137 07/12/24 0215 07/12/24 0900  WBC 8.6   < > 6.1 8.2  NEUTROABS 4.9  --   --   --   HGB 8.3*   < > 8.0* 8.2*  HCT 26.7*   < > 26.0* 26.6*  MCV 86.1   < > 87.8 86.4  PLT 339   < > 293 328   < > = values in this interval not displayed.   Cardiac Enzymes: Recent Labs    07/11/24 2137 07/12/24 0215 07/12/24 0532  TROPONINIHS 205* 980* 1,862*   BNP: Recent Labs    07/11/24 1706 07/12/24 0900  BNP 222.2* 1,552.7*   D-Dimer: No results for input(s): DDIMER in the last 72 hours. Hemoglobin A1C: No results for input(s): HGBA1C in the last 72 hours. Fasting Lipid Panel: No results for input(s): CHOL, HDL, LDLCALC, TRIG, CHOLHDL, LDLDIRECT in the last 72 hours. Thyroid Function Tests: No results for input(s): TSH, T4TOTAL, T3FREE, THYROIDAB in the last 72 hours.  Invalid input(s): FREET3 Anemia Panel: No results for input(s): VITAMINB12, FOLATE, FERRITIN, TIBC, IRON , RETICCTPCT in the last 72 hours.   Radiology: CT Angio Chest PE W/Cm &/Or Wo Cm Result Date: 07/11/2024 CLINICAL DATA:  High probability for PE. Chest pain with shortness of breath EXAM: CT ANGIOGRAPHY CHEST WITH CONTRAST TECHNIQUE: Multidetector CT imaging of the chest was performed using the standard protocol during bolus  administration of intravenous contrast. Multiplanar CT image reconstructions and MIPs were obtained to evaluate the vascular anatomy. RADIATION DOSE REDUCTION: This exam was performed according to the departmental dose-optimization program which includes automated exposure control, adjustment of the mA and/or kV according to patient size and/or use of iterative reconstruction technique. CONTRAST:  75mL OMNIPAQUE  IOHEXOL  350 MG/ML SOLN COMPARISON:  CT angiogram chest 05/09/2024 FINDINGS: Cardiovascular: Satisfactory opacification of the pulmonary arteries to the segmental level. No evidence of pulmonary embolism. Patient is status post cardiac surgery. The heart is enlarged. There is no pericardial effusion. There are atherosclerotic calcifications of the aorta and coronary arteries. Aorta is normal in size. Mediastinum/Nodes: There are enlarged right hilar lymph nodes measuring up to 11 mm. Enlarged paratracheal lymph nodes measure up to 10 mm. Visualized thyroid gland and esophagus are within normal limits. Lungs/Pleura: Moderate emphysema again seen. There is stable scarring in the right lung apex. There is new patchy slightly nodular airspace disease in the right upper lobe measuring 2.0 by 1.1 cm. There is right-sided central peribronchial wall thickening. Previously identified airspace disease in the left lower lobe has resolved. There is no pleural effusion or pneumothorax. There is a stable left lower lobe pulmonary nodule image 6/57. Upper Abdomen: No acute abnormality. Musculoskeletal: No chest wall abnormality. No acute or significant osseous findings. Review of the MIP images confirms the above findings. IMPRESSION: 1. No evidence for pulmonary embolism. 2. New patchy slightly nodular airspace disease in the  right upper lobe worrisome for pneumonia. Follow-up CT recommended in 4-6 weeks recommended to ensure resolution and to exclude underlying nodule. 3. Right-sided central peribronchial wall thickening  worrisome for bronchitis. 4. Right hilar and mediastinal lymphadenopathy, likely reactive. 5. Stable left lower lobe pulmonary nodule. 6. Cardiomegaly. Aortic Atherosclerosis (ICD10-I70.0) and Emphysema (ICD10-J43.9). Electronically Signed   By: Greig Pique M.D.   On: 07/11/2024 18:54   DG Chest Portable 1 View Result Date: 07/11/2024 CLINICAL DATA:  SOB EXAM: PORTABLE CHEST - 1 VIEW COMPARISON:  June 24, 2024 FINDINGS: Biapical pleural thickening. Sternotomy wires and CABG markers. No focal airspace consolidation, pleural effusion, or pneumothorax. Mild cardiomegaly. Tortuous aorta with aortic atherosclerosis. No acute fracture or destructive lesions. Multilevel thoracic osteophytosis. IMPRESSION: No acute cardiopulmonary abnormality. Electronically Signed   By: Rogelia Myers M.D.   On: 07/11/2024 17:25   CARDIAC CATHETERIZATION Result Date: 07/02/2024   Prox LAD to Mid LAD lesion is 90% stenosed.   Ost LM to Mid LM lesion is 50% stenosed.   Ost Cx to Prox Cx lesion is 90% stenosed.   Ost RCA to Prox RCA lesion is 100% stenosed.   Mid Graft to Dist Graft lesion is 95% stenosed.   Recommend uninterrupted dual antiplatelet therapy with Aspirin  81mg  daily and Clopidogrel  75mg  daily for a minimum of 6 months (stable ischemic heart disease-Class I recommendation). 1.  Severe native three-vessel CAD 2.  LIMA to LAD and SVG to OM widely patent 3.  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  Successful direct stenting of the distal SVG to PDA with distal protection with a 4.0 x 15 mm drug-eluting stent with intravascular ultrasound guidance 5.  Aspirin  and clopidogrel  for at least 6 months followed by clopidogrel  indefinitely 6.  Continue workup for aortic valve replacement   DG Chest 2 View Result Date: 06/24/2024 CLINICAL DATA:  sob EXAM: CHEST - 2 VIEW COMPARISON:  June 03, 2024 FINDINGS: Biapical pleural thickening. No focal airspace consolidation, pleural effusion, or pneumothorax. No  cardiomegaly. Sternotomy wires and CABG markers. Tortuous aorta with aortic atherosclerosis. No acute fracture or destructive lesions. Multilevel thoracic osteophytosis. Osteopenia. IMPRESSION: No acute cardiopulmonary abnormality. Electronically Signed   By: Rogelia Myers M.D.   On: 06/24/2024 11:54    ECHO 04/2024: 1. Left ventricular ejection fraction, by estimation, is 60 to 65%. The left ventricle has normal function. The left ventricle has no regional wall motion abnormalities. The left ventricular internal cavity size was mildly dilated. Left ventricular diastolic parameters are consistent with Grade I diastolic dysfunction (impaired relaxation).   2. Right ventricular systolic function is normal. The right ventricular  size is normal.   3. The mitral valve is normal in structure. No evidence of mitral valve  regurgitation.   4. The aortic valve is normal in structure. Aortic valve regurgitation is  trivial. Severe aortic valve stenosis.   TELEMETRY reviewed by me 07/12/2024: sinus tachy 120s  EKG reviewed by me: sinus tachycardia ST depression, rate 129 bpm  Data reviewed by me 07/12/2024: last 24h vitals tele labs imaging I/O ED provider note, admission H&P  Principal Problem:   Acute respiratory distress Active Problems:   Hyperlipidemia   Cognitive impairment   Alcohol  abuse   Essential hypertension   CAD (coronary artery disease)   Stroke (HCC)   Iron  deficiency anemia   Acute on chronic diastolic CHF (congestive heart failure) (HCC)   COPD exacerbation (HCC)   Elevated lactic acid level   Lobar pneumonia (  HCC)   Metabolic acidosis, increased anion gap   Myocardial injury    ASSESSMENT AND PLAN:  [name] is a 62 y.o. 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 Found to have pneumonia, overnight troponins uptrending. Cardiology was consulted for further evaluation.   # Acute hypoxic respiratory failure # HCAP # COPD exacerbation # Lactic acidosis # Acute on chronic HFpEF (?flash pulmonary edema) # Severe aortic stenosis Presented with SOB, cough, imaging concerning for HCAP/COPD exacerbation. LA elevated on presentation at 3.6 > 5.9. BNP initially 222 up to 1500 this AM. With worsening respiratory status this AM, started on BiPAP and transferred to the ICU.  -Appreciate PCCM. BiPAP for increased WOB. Patient advised if respiratory status decompensates he may require intubation.  -S/p 1 dose of IV lasix  40 mg this AM, given BNP elevation will likely plan to continue BID dosing.  -Further management of COPD exacerbation/PNA as per primary team.  -CIWA protocol given hx of alcohol  abuse.  # Elevated troponin # NSTEMI, suspect type II # Coronary artery disease s/p CABG x3 (04/2012) # Hypertension # Hyperlipidemia With recent LHC, s/p DES to SVG to PDA 07/02/2024. Reported some CP when he first came to hospital which has now resolved.  Troponin trended 44 > 144 > 205 > 980 > 1862. Cr stable at 0.9. -Resume home plavix  75 mg daily. Continue aspirin  81 mg daily. Heparin  deferred by hospitalist given Hgb down to 8 from 10 last admission.  -Continue home pravastatin  20 mg daily. -Will resume home metoprolol  succinate 12.5 mg daily once volume status improves.  -Patient not on aspirin  due to history of multiple small bowel AVMs that have been intermittently bleeding for years. -troponin elevation most consistent with demand/supply mismatch and not ACS in the setting of COPD exacerbation/HCAP, lactic acidosis, possible flash pulmonary edema.   TIMI Risk Score for Unstable Angina or Non-ST Elevation MI:   The patient's TIMI  risk score is 5, which indicates a 26% risk of all cause mortality, new or recurrent myocardial infarction or need for urgent revascularization in the next 14 days.   This patient's plan of care was discussed and created with Dr. Florencio and he is in agreement.  Signed: Danita Bloch, PA-C  07/12/2024, 11:32 AM [facility] Cardiology

## 2024-07-13 ENCOUNTER — Inpatient Hospital Stay

## 2024-07-13 DIAGNOSIS — I214 Non-ST elevation (NSTEMI) myocardial infarction: Secondary | ICD-10-CM | POA: Diagnosis not present

## 2024-07-13 DIAGNOSIS — R0603 Acute respiratory distress: Secondary | ICD-10-CM | POA: Diagnosis not present

## 2024-07-13 DIAGNOSIS — I5033 Acute on chronic diastolic (congestive) heart failure: Secondary | ICD-10-CM | POA: Diagnosis not present

## 2024-07-13 DIAGNOSIS — F1090 Alcohol use, unspecified, uncomplicated: Secondary | ICD-10-CM | POA: Diagnosis not present

## 2024-07-13 DIAGNOSIS — J441 Chronic obstructive pulmonary disease with (acute) exacerbation: Secondary | ICD-10-CM | POA: Diagnosis not present

## 2024-07-13 DIAGNOSIS — G928 Other toxic encephalopathy: Secondary | ICD-10-CM | POA: Diagnosis not present

## 2024-07-13 LAB — HEPARIN LEVEL (UNFRACTIONATED)
Heparin Unfractionated: 0.21 [IU]/mL — ABNORMAL LOW (ref 0.30–0.70)
Heparin Unfractionated: 0.41 [IU]/mL (ref 0.30–0.70)
Heparin Unfractionated: 0.42 [IU]/mL (ref 0.30–0.70)

## 2024-07-13 LAB — BLOOD CULTURE ID PANEL (REFLEXED) - BCID2

## 2024-07-13 LAB — CBC
HCT: 23.2 % — ABNORMAL LOW (ref 39.0–52.0)
HCT: 24 % — ABNORMAL LOW (ref 39.0–52.0)
HCT: 25.2 % — ABNORMAL LOW (ref 39.0–52.0)
HCT: 26 % — ABNORMAL LOW (ref 39.0–52.0)
Hemoglobin: 7.1 g/dL — ABNORMAL LOW (ref 13.0–17.0)
Hemoglobin: 7.5 g/dL — ABNORMAL LOW (ref 13.0–17.0)
Hemoglobin: 7.7 g/dL — ABNORMAL LOW (ref 13.0–17.0)
Hemoglobin: 8.3 g/dL — ABNORMAL LOW (ref 13.0–17.0)
MCH: 26.2 pg (ref 26.0–34.0)
MCH: 26.2 pg (ref 26.0–34.0)
MCH: 26.6 pg (ref 26.0–34.0)
MCH: 27.3 pg (ref 26.0–34.0)
MCHC: 30.6 g/dL (ref 30.0–36.0)
MCHC: 30.6 g/dL (ref 30.0–36.0)
MCHC: 31.3 g/dL (ref 30.0–36.0)
MCHC: 31.9 g/dL (ref 30.0–36.0)
MCV: 85.1 fL (ref 80.0–100.0)
MCV: 85.5 fL (ref 80.0–100.0)
MCV: 85.6 fL (ref 80.0–100.0)
MCV: 85.7 fL (ref 80.0–100.0)
Platelets: 244 K/uL (ref 150–400)
Platelets: 278 K/uL (ref 150–400)
Platelets: 298 K/uL (ref 150–400)
Platelets: 346 K/uL (ref 150–400)
RBC: 2.71 MIL/uL — ABNORMAL LOW (ref 4.22–5.81)
RBC: 2.82 MIL/uL — ABNORMAL LOW (ref 4.22–5.81)
RBC: 2.94 MIL/uL — ABNORMAL LOW (ref 4.22–5.81)
RBC: 3.04 MIL/uL — ABNORMAL LOW (ref 4.22–5.81)
RDW: 15.9 % — ABNORMAL HIGH (ref 11.5–15.5)
RDW: 15.9 % — ABNORMAL HIGH (ref 11.5–15.5)
RDW: 15.9 % — ABNORMAL HIGH (ref 11.5–15.5)
RDW: 16 % — ABNORMAL HIGH (ref 11.5–15.5)
WBC: 10.1 K/uL (ref 4.0–10.5)
WBC: 11.1 K/uL — ABNORMAL HIGH (ref 4.0–10.5)
WBC: 13 K/uL — ABNORMAL HIGH (ref 4.0–10.5)
WBC: 22.6 K/uL — ABNORMAL HIGH (ref 4.0–10.5)
nRBC: 0 % (ref 0.0–0.2)
nRBC: 0 % (ref 0.0–0.2)
nRBC: 0 % (ref 0.0–0.2)
nRBC: 0 % (ref 0.0–0.2)

## 2024-07-13 LAB — BASIC METABOLIC PANEL WITH GFR
Anion gap: 9 (ref 5–15)
BUN: 22 mg/dL (ref 8–23)
CO2: 26 mmol/L (ref 22–32)
Calcium: 8.5 mg/dL — ABNORMAL LOW (ref 8.9–10.3)
Chloride: 99 mmol/L (ref 98–111)
Creatinine, Ser: 0.9 mg/dL (ref 0.61–1.24)
GFR, Estimated: >60 mL/min (ref >60)
Glucose, Bld: 183 mg/dL — ABNORMAL HIGH (ref 70–99)
Potassium: 3.8 mmol/L (ref 3.5–5.1)
Sodium: 134 mmol/L — ABNORMAL LOW (ref 135–145)

## 2024-07-13 LAB — HEMOGLOBIN
Hemoglobin: 7.9 g/dL — ABNORMAL LOW (ref 13.0–17.0)
Hemoglobin: 8.5 g/dL — ABNORMAL LOW (ref 13.0–17.0)

## 2024-07-13 LAB — MAGNESIUM: Magnesium: 2.1 mg/dL (ref 1.7–2.4)

## 2024-07-13 MED ORDER — PHENOBARBITAL SODIUM 130 MG/ML IJ SOLN
130.0000 mg | Freq: Once | INTRAMUSCULAR | Status: AC
  Administered 2024-07-13: 130 mg via INTRAVENOUS
  Filled 2024-07-13: qty 1

## 2024-07-13 MED ORDER — PHENOBARBITAL SODIUM 130 MG/ML IJ SOLN: 97.5000 mg | Freq: Three times a day (TID) | INTRAMUSCULAR | Status: DC

## 2024-07-13 MED ORDER — IPRATROPIUM-ALBUTEROL 0.5-2.5 (3) MG/3ML IN SOLN
3.0000 mL | Freq: Two times a day (BID) | RESPIRATORY_TRACT | Status: DC
  Administered 2024-07-13 – 2024-07-14 (×2): 3 mL via RESPIRATORY_TRACT
  Filled 2024-07-13 (×2): qty 3

## 2024-07-13 MED ORDER — PHENOBARBITAL SODIUM 65 MG/ML IJ SOLN: 32.5000 mg | Freq: Three times a day (TID) | INTRAMUSCULAR | Status: DC

## 2024-07-13 MED ORDER — PHENOBARBITAL SODIUM 130 MG/ML IJ SOLN: 65.0000 mg | Freq: Three times a day (TID) | INTRAMUSCULAR | Status: DC

## 2024-07-13 MED ORDER — SODIUM CHLORIDE 0.9 % IV SOLN
260.0000 mg | Freq: Once | INTRAVENOUS | Status: AC
  Administered 2024-07-13: 260 mg via INTRAVENOUS
  Filled 2024-07-13: qty 2

## 2024-07-13 MED ORDER — HEPARIN BOLUS VIA INFUSION
1500.0000 [IU] | Freq: Once | INTRAVENOUS | Status: AC
  Administered 2024-07-13: 1500 [IU] via INTRAVENOUS
  Filled 2024-07-13: qty 1500

## 2024-07-13 MED ORDER — DEXMEDETOMIDINE HCL IN NACL 400 MCG/100ML IV SOLN
0.0000 ug/kg/h | INTRAVENOUS | Status: DC
  Administered 2024-07-13: 0.4 ug/kg/h via INTRAVENOUS
  Administered 2024-07-14: 0.8 ug/kg/h via INTRAVENOUS
  Administered 2024-07-14: 0.5 ug/kg/h via INTRAVENOUS
  Administered 2024-07-14: 0.9 ug/kg/h via INTRAVENOUS
  Administered 2024-07-15: 1.2 ug/kg/h via INTRAVENOUS
  Administered 2024-07-15: 1.1 ug/kg/h via INTRAVENOUS
  Administered 2024-07-15: 1.2 ug/kg/h via INTRAVENOUS
  Administered 2024-07-15 – 2024-07-16 (×4): 1 ug/kg/h via INTRAVENOUS
  Administered 2024-07-16: 0.8 ug/kg/h via INTRAVENOUS
  Filled 2024-07-13 (×14): qty 100

## 2024-07-13 MED ORDER — METOPROLOL SUCCINATE ER 25 MG PO TB24
12.5000 mg | ORAL_TABLET | Freq: Every day | ORAL | Status: DC
  Administered 2024-07-13 – 2024-07-15 (×2): 12.5 mg via ORAL
  Filled 2024-07-13 (×3): qty 0.5

## 2024-07-13 MED ORDER — CHLORHEXIDINE GLUCONATE CLOTH 2 % EX PADS
6.0000 | MEDICATED_PAD | Freq: Every day | CUTANEOUS | Status: DC
  Administered 2024-07-13 – 2024-07-27 (×15): 6 via TOPICAL

## 2024-07-13 MED ORDER — SODIUM CHLORIDE 0.9 % IV SOLN: 260.0000 mg | Freq: Once | INTRAVENOUS | Status: DC

## 2024-07-13 MED ORDER — PHENOBARBITAL SODIUM 130 MG/ML IJ SOLN
230.0000 mg | Freq: Once | INTRAMUSCULAR | Status: AC
  Administered 2024-07-13: 230 mg via INTRAVENOUS
  Filled 2024-07-13: qty 2

## 2024-07-13 NOTE — Consult Note (Addendum)
 NAME:  [name], MRN:  [id number], DOB:  03-Jun-1961, LOS: 2 ADMISSION DATE:  07/11/2024, CONSULTATION DATE:  07/12/2024 REFERRING MD:  Charlie Patterson, MD, CHIEF COMPLAINT:  Respiratory Failure   History of Present Illness:   Patient is a 63 year old male who was admitted to the hospital on 7/25 after presenting with shortness of breath and chest discomfort.  He has a known history of severe aortic stenosis as well as coronary artery disease.  He is previously had CABG as well as PCI.  He also has underlying COPD, HFpEF, alcohol  use disorder, and GI bleed.  Patient was recently in the hospital on 7/16 - 7/17 for left heart cath with stenting to bypass graft (SVG to PDA).  He developed shortness of breath with cough productive of sputum over the past few days prompting presentation to the ED yesterday.  On arrival to the ED, his lower extremities were swollen and he was found to have a wheeze.  He was hypoxic requiring oxygen support via nasal cannula.  Given respiratory distress, he was started on treatment for COPD exacerbation and admitted to the hospitalist service for further management.  CT scan of the chest was negative for PE but did note some patchy nodular airspace disease as well as airway thickening.  He was started on broad-spectrum IV antibiotics, steroids, bronchodilators, and received IV fluids for sepsis given elevated lactic acid.  This morning, he had increased shortness of breath with respiratory distress prompting initiation of BiPAP and transferred to the ICU.  He was also started on dexmedetomidine  for agitation.  Blood work showed normal kidney function, but with rising troponin and a significantly elevated BNP. Hemoglobin has been stable.  Pertinent  Medical History   -CAD s/p CABG -HFpEF -Severe Aortic Stenosis -EtOH use -COPD on Trelegy  Significant Hospital Events: Including procedures, antibiotic start and stop dates in addition to other pertinent events    7/25: admit with respiratory failure 7/26: clinically decompensated, worsening respiratory status initiated on BiPAP 7/27: on nasal cannula, reports anxiety and tremors consistent with EtOH withdrawal.  Interim History / Subjective:  Awake and alert, follows commands  Objective    Blood pressure (!) 145/86, pulse (!) 122, temperature 98.2 F (36.8 C), resp. rate (!) 24, height 6' (1.829 m), weight 97.8 kg, SpO2 99%.    FiO2 (%):  [30 %] 30 %   Intake/Output Summary (Last 24 hours) at 07/13/2024 1236 Last data filed at 07/13/2024 1057 Gross per 24 hour  Intake 498.42 ml  Output 4495 ml  Net -3996.58 ml   Filed Weights   07/11/24 1719 07/12/24 0931 07/13/24 0500  Weight: 95 kg 100.1 kg 97.8 kg    Examination: Physical Exam Constitutional:      General: He is not in acute distress.    Appearance: He is not ill-appearing.  Cardiovascular:     Rate and Rhythm: Normal rate and regular rhythm.     Pulses: Normal pulses.     Heart sounds: Normal heart sounds.  Pulmonary:     Effort: Pulmonary effort is normal.     Breath sounds: No wheezing.     Comments: Decreased air entry bilaterally Abdominal:     Palpations: Abdomen is soft.  Neurological:     General: No focal deficit present.     Mental Status: He is alert. Mental status is at baseline.     Motor: Weakness present.     Assessment and Plan   Neurology #Toxic Metabolic Encephalopathy #EtOH withdrawal  This  is in the setting of critical illness and pulmonary edema. Improved with diuresis and BiPAP. Off dexmedetomidine  since yesterday. History of EtOH use and is showing signs of withdrawal, will start with phenobarbital .  -phenobarbital  for EtOH withdrawal. Patient receiving multiple doses for his withdrawal, will continue to load him and discontinue once therapeutic effect reached.  Cardiovascular #Acute Decompensated HFpEF #NSTEMI #Sepsis #Severe Aortic Stenosis #CAD  History of CAD, s/p CABG; also with  recent LHC with stenting of one of the saphenous venous grafts. Presents with respiratory failure and developed decompensated heart failure and NSTEMI. He is known to have severe aortic stenosis and was undergoing workup for this prior to this admission. Will continue to trend troponins and initiate heparin  gtt for NSTEMI. He ultimately needs an evaluation with a structural heart specialist for consideration of TAVR.  -heparin  gtt for NSTEMI -continue to trend troponin -gentle IV diuresis given pre-load dependence -cardiology consulted  Pulmonary #Acute Hypoxic Respiratory Failure #COPD Exacerbation #Pulmonary Edema  His respiratory failure is driven by COPD exacerbation (noted emphysema and airway thickening on CT) as well as decompensated heart failure given volume resuscitation, aortic stenosis, and elevated BNP. Improved and is off BiPAP and on nasal cannula.  -duonebs every 6 hours -switch steroids to methylpred 40 mg daily -Nocturnal BiPAP -continue IV diuresis  Gastrointestinal #AVM  Known to have small bowel AVM (association with AS). He is on bid PPI given concern for GI bleed, which we will continue. Maintained NPO for now. Has hepatic steatosis but not known to have liver cirrhosis, and LFT's are within normal on presentation.  -continue IV PPI bid  Renal #Metabolic Acidosis  #Lactic Acidosis  This is in the setting of COPD exacerbation and possible infectious process. Lactic acid is improved and clearing. Holding IV fluids given decompensated heart failure. Kidney function at baseline.  Endocrine  ICU glycemic protocol  Hem/Onc  Hemoglobin, while lower than prior, is improved. He has history of AVM's which is associated with aortic stenosis. Will re-initiate heparin  gtt and continue to monitor hemoglobin..  -heparin  gtt -H/H bid -goal hemoglobin of 8 given cardiac history, AS, and NSTEMI  ID #COPD Exacerbation #Sepsis #RUL Pneumonia  Initiated on broad  spectrum antibiotics given elevated lactic acid and concern for sepsis secondary to RUL pneumonia.   -switch to Zosyn , d/c vanc (MRSA negative) -cultures pending  Best Practice (right click and Reselect all SmartList Selections daily)   Diet/type: Regular consistency (see orders) DVT prophylaxis systemic heparin  Pressure ulcer(s): N/A GI prophylaxis: PPI Lines: N/A Foley:  Yes, and it is still needed Code Status:  full code Last date of multidisciplinary goals of care discussion [07/13/2024]  Labs   CBC: Recent Labs  Lab 07/11/24 1706 07/11/24 2137 07/12/24 0215 07/12/24 0900 07/12/24 1439 07/13/24 0006 07/13/24 0420 07/13/24 1138  WBC 8.6   < > 6.1 8.2 6.7 11.1* 13.0*  --   NEUTROABS 4.9  --   --   --   --   --   --   --   HGB 8.3*   < > 8.0* 8.2* 7.9* 7.7* 7.5* 7.9*  HCT 26.7*   < > 26.0* 26.6* 25.1* 25.2* 24.0*  --   MCV 86.1   < > 87.8 86.4 85.4 85.7 85.1  --   PLT 339   < > 293 328 271 298 278  --    < > = values in this interval not displayed.    Basic Metabolic Panel: Recent Labs  Lab 07/11/24 1706 07/12/24  0215 07/12/24 0900 07/13/24 0420  NA 135 136  --  134*  K 4.3 4.5  --  3.8  CL 98 102  --  99  CO2 23 15*  --  26  GLUCOSE 102* 167*  --  183*  BUN 11 13  --  22  CREATININE 0.91 0.94  --  0.90  CALCIUM  8.7* 8.6*  --  8.5*  MG  --   --  1.8 2.1   GFR: Estimated Creatinine Clearance: 99.2 mL/min (by C-G formula based on SCr of 0.9 mg/dL). Recent Labs  Lab 07/11/24 1951 07/11/24 2137 07/12/24 0215 07/12/24 0900 07/12/24 1439 07/13/24 0006 07/13/24 0420  WBC  --  6.2   < > 8.2 6.7 11.1* 13.0*  LATICACIDVEN 3.6* 5.9*  --  3.4*  --   --   --    < > = values in this interval not displayed.    Liver Function Tests: Recent Labs  Lab 07/11/24 1706 07/12/24 0900  AST 21 62*  ALT 16 18  ALKPHOS 43 45  BILITOT 0.9 0.9  PROT 7.3 7.3  ALBUMIN 3.5 3.5   No results for input(s): LIPASE, AMYLASE in the last 168 hours. No results for  input(s): AMMONIA in the last 168 hours.  ABG    Component Value Date/Time   PHART 7.36 07/12/2024 0814   PCO2ART 38 07/12/2024 0814   PO2ART 364 (H) 07/12/2024 0814   HCO3 21.5 07/12/2024 0814   ACIDBASEDEF 3.6 (H) 07/12/2024 0814   O2SAT 100 07/12/2024 0814     Coagulation Profile: Recent Labs  Lab 07/11/24 1706  INR 1.1    Cardiac Enzymes: No results for input(s): CKTOTAL, CKMB, CKMBINDEX, TROPONINI in the last 168 hours.  HbA1C: Hemoglobin A1C  Date/Time Value Ref Range Status  12/02/2013 03:52 PM 5.3 4.2 - 6.3 % Final    Comment:    The American Diabetes Association recommends that a primary goal of therapy should be <7% and that physicians should reevaluate the treatment regimen in patients with HbA1c values consistently >8%.    Hgb A1c MFr Bld  Date/Time Value Ref Range Status  10/28/2022 04:37 AM 5.3 4.8 - 5.6 % Final    Comment:    (NOTE) Pre diabetes:          5.7%-6.4%  Diabetes:              >6.4%  Glycemic control for   <7.0% adults with diabetes     CBG: Recent Labs  Lab 07/12/24 0933  GLUCAP 169*    Review of Systems:   N/A  Past Medical History:  He,  has a past medical history of COPD (chronic obstructive pulmonary disease) (HCC), CVA (cerebral infarction), Headache, Hypertension, MI (myocardial infarction) (HCC) (05/09/2012), Reading difficulty, Stroke (HCC) (2012), and Wears dentures.   Surgical History:   Past Surgical History:  Procedure Laterality Date   CAROTID ENDARTERECTOMY Left    2012 or 2013   CHOLECYSTECTOMY N/A 07/26/2016   Procedure: LAPAROSCOPIC CHOLECYSTECTOMY;  Surgeon: Charlie FORBES Fell, MD;  Location: [facility] ORS;  Service: General;  Laterality: N/A;   COLONOSCOPY N/A 05/08/2024   Procedure: COLONOSCOPY;  Surgeon: Jinny Carmine, MD;  Location: [facility] ENDOSCOPY;  Service: Endoscopy;  Laterality: N/A;   COLONOSCOPY WITH PROPOFOL  N/A 02/09/2022   Procedure: COLONOSCOPY WITH PROPOFOL ;  Surgeon: Jinny Carmine, MD;   Location: [facility] ENDOSCOPY;  Service: Endoscopy;  Laterality: N/A;   CORONARY ARTERY BYPASS GRAFT  05/10/2012   3 vessel   CORONARY  STENT INTERVENTION N/A 07/02/2024   Procedure: CORONARY STENT INTERVENTION;  Surgeon: Katina Albright, MD;  Location: [facility] INVASIVE CV LAB;  Service: Cardiovascular;  Laterality: N/A;   CORONARY ULTRASOUND/IVUS N/A 07/02/2024   Procedure: Coronary Ultrasound/IVUS;  Surgeon: Katina Albright, MD;  Location: [facility] INVASIVE CV LAB;  Service: Cardiovascular;  Laterality: N/A;   ENTEROSCOPY N/A 05/16/2023   Procedure: ENTEROSCOPY;  Surgeon: Therisa Bi, MD;  Location: [facility] ENDOSCOPY;  Service: Gastroenterology;  Laterality: N/A;   ENTEROSCOPY N/A 06/01/2023   Procedure: ENTEROSCOPY;  Surgeon: Unk Corinn Skiff, MD;  Location: [facility] ENDOSCOPY;  Service: Gastroenterology;  Laterality: N/A;   ESOPHAGOGASTRODUODENOSCOPY  05/13/2023   Procedure: ESOPHAGOGASTRODUODENOSCOPY (EGD);  Surgeon: Jinny Carmine, MD;  Location: [facility] ENDOSCOPY;  Service: Endoscopy;;   ESOPHAGOGASTRODUODENOSCOPY (EGD) WITH PROPOFOL  N/A 10/28/2022   Procedure: ESOPHAGOGASTRODUODENOSCOPY (EGD) WITH PROPOFOL ;  Surgeon: Onita Elspeth Sharper, DO;  Location: [facility] ENDOSCOPY;  Service: Gastroenterology;  Laterality: N/A;   GIVENS CAPSULE STUDY  05/13/2023   Procedure: GIVENS CAPSULE STUDY;  Surgeon: Jinny Carmine, MD;  Location: [facility] ENDOSCOPY;  Service: Endoscopy;;   GIVENS CAPSULE STUDY  06/01/2023   Procedure: GIVENS CAPSULE STUDY;  Surgeon: Unk Corinn Skiff, MD;  Location: [facility] ENDOSCOPY;  Service: Gastroenterology;;   LEFT HEART CATH AND CORS/GRAFTS ANGIOGRAPHY N/A 07/02/2024   Procedure: LEFT HEART CATH AND CORS/GRAFTS ANGIOGRAPHY;  Surgeon: Katina Albright, MD;  Location: [facility] INVASIVE CV LAB;  Service: Cardiovascular;  Laterality: N/A;     Social History:   reports that he quit smoking about 32 years ago. His smoking use included cigarettes. His smokeless tobacco use includes snuff. He reports current alcohol  use of about  26.0 standard drinks of alcohol  per week. He reports that he does not use drugs.   Family History:  His family history includes Pneumonia in his mother.   Allergies Allergies  Allergen Reactions   Wellbutrin  [Bupropion ]     Paranoia      Home Medications  Prior to Admission medications   Medication Sig Start Date End Date Taking? Authorizing Provider  albuterol  (VENTOLIN  HFA) 108 (90 Base) MCG/ACT inhaler Inhale 2 puffs into the lungs every 6 (six) hours as needed for wheezing or shortness of breath. 07/03/24  Yes Von Bellis, MD  ascorbic acid  (VITAMIN C ) 500 MG tablet Take 1 tablet (500 mg total) by mouth daily. 07/03/24 10/01/24 Yes Von Bellis, MD  aspirin  EC 81 MG tablet Take 1 tablet (81 mg total) by mouth daily. Swallow whole. 07/03/24 12/30/24 Yes Von Bellis, MD  clopidogrel  (PLAVIX ) 75 MG tablet Take 1 tablet (75 mg total) by mouth daily with breakfast. 07/03/24  Yes Von Bellis, MD  dapagliflozin  propanediol (FARXIGA ) 10 MG TABS tablet Take 1 tablet (10 mg total) by mouth daily. 07/03/24  Yes Von Bellis, MD  folic acid  (FOLVITE ) 1 MG tablet Take 1 tablet (1 mg total) by mouth daily. 07/03/24 10/01/24 Yes Von Bellis, MD  iron  polysaccharides (NIFEREX) 150 MG capsule Take 1 capsule (150 mg total) by mouth daily. 07/03/24 10/01/24 Yes Von Bellis, MD  isosorbide  mononitrate (IMDUR ) 60 MG 24 hr tablet Take 1 tablet (60 mg total) by mouth daily. 07/03/24 07/03/25 Yes Von Bellis, MD  losartan  (COZAAR ) 25 MG tablet Take 0.5 tablets (12.5 mg total) by mouth daily. 07/04/24 07/04/25 Yes Von Bellis, MD  metoprolol  succinate (TOPROL -XL) 25 MG 24 hr tablet Take 0.5 tablets (12.5 mg total) by mouth daily. 07/03/24  Yes Von Bellis, MD  pantoprazole  (PROTONIX ) 40 MG tablet Take 1 tablet (40 mg total) by mouth daily.  07/03/24 10/01/24 Yes Von Bellis, MD  pravastatin  (PRAVACHOL ) 20 MG tablet Take 1 tablet (20 mg total) by mouth at bedtime. 07/03/24 07/03/25 Yes Von Bellis, MD   spironolactone  (ALDACTONE ) 25 MG tablet Take 0.5 tablets (12.5 mg total) by mouth daily. 07/03/24  Yes Von Bellis, MD  torsemide  (DEMADEX ) 10 MG tablet Take 1 tablet (10 mg total) by mouth daily. 07/03/24  Yes Von Bellis, MD     The patient is critically ill due to EtOH withdrawal, respiratory failure, Aortic Stenosis.  Critical care was necessary to treat or prevent imminent or life-threatening deterioration. Critical care time was spent by me on the following activities: development of a treatment plan with the patient and/or surrogate as well as nursing, discussions with consultants, evaluation of the patient's response to treatment, examination of the patient, obtaining a history from the patient or surrogate, ordering and performing treatments and interventions, ordering and review of laboratory studies, ordering and review of radiographic studies, review of telemetry data including pulse oximetry, re-evaluation of patient's condition and participation in multidisciplinary rounds.   I personally spent 37 minutes providing critical care not including any separately billable procedures.   Belva November, MD [facility] 07/13/2024 12:39 PM

## 2024-07-13 NOTE — Progress Notes (Signed)
 PHARMACY - ANTICOAGULATION CONSULT NOTE  Pharmacy Consult for heparin  infusion Indication: chest pain/ACS  Allergies  Allergen Reactions   Wellbutrin  [Bupropion ]     Paranoia     Patient Measurements: Height: 6' (182.9 cm) Weight: 97.8 kg (215 lb 9.8 oz) IBW/kg (Calculated) : 77.6 HEPARIN  DW (KG): 95  Vital Signs: Temp: 98.4 F (36.9 C) (07/27 0600) Temp Source: Oral (07/27 0600) BP: 143/102 (07/27 0600) Pulse Rate: 104 (07/27 0600)  Labs: Recent Labs    07/11/24 1706 07/11/24 1951 07/12/24 0215 07/12/24 0532 07/12/24 0900 07/12/24 1439 07/12/24 1537 07/12/24 1740 07/13/24 0006 07/13/24 0420  HGB 8.3*   < > 8.0*  --    < > 7.9*  --   --  7.7* 7.5*  HCT 26.7*   < > 26.0*  --    < > 25.1*  --   --  25.2* 24.0*  PLT 339   < > 293  --    < > 271  --   --  298 278  APTT 34  --   --   --   --   --   --   --   --   --   LABPROT 15.3*  --   --   --   --   --   --   --   --   --   INR 1.1  --   --   --   --   --   --   --   --   --   HEPARINUNFRC  --   --   --   --   --   --   --   --  0.21* 0.42  CREATININE 0.91  --  0.94  --   --   --   --   --   --  0.90  TROPONINIHS 44*   < > 980* 1,862*  --   --  5,766* 5,756*  --   --    < > = values in this interval not displayed.    Estimated Creatinine Clearance: 99.2 mL/min (by C-G formula based on SCr of 0.9 mg/dL).   Medical History: Past Medical History:  Diagnosis Date   COPD (chronic obstructive pulmonary disease) (HCC)    CVA (cerebral infarction)    Headache    mild, since stroke 2012   Hypertension    MI (myocardial infarction) (HCC) 05/09/2012   Reading difficulty    pt reports he reads at about a second grade level   Stroke (HCC) 2012   numbness to left hand    Wears dentures    full upper and lower    Medications:  Scheduled:   ascorbic acid   500 mg Oral Daily   aspirin  EC  81 mg Oral Daily   Chlorhexidine  Gluconate Cloth  6 each Topical Q0600   clopidogrel   75 mg Oral Daily   folic acid   1 mg  Oral Daily   furosemide   40 mg Intravenous BID   ipratropium-albuterol   3 mL Nebulization Q6H   iron  polysaccharides  150 mg Oral Daily   methylPREDNISolone  (SOLU-MEDROL ) injection  40 mg Intravenous Q24H   multivitamin with minerals  1 tablet Oral Daily   pantoprazole  (PROTONIX ) IV  40 mg Intravenous Q12H   pravastatin   20 mg Oral QHS   thiamine   100 mg Oral Daily   Or   thiamine   100 mg Intravenous Daily   Infusions:   heparin  1,550 Units/hr (07/13/24  0600)   piperacillin -tazobactam (ZOSYN )  IV 3.375 g (07/13/24 0630)    Assessment: 63 y.o. male with medical history significant for  CAD S/P CABG, chronic HFpEF, severe aortic stenosis, HTN, HLD, obesity, COPD, recent GI bleed, alcohol  use disorder presented to [facility] for SOB, cough, chest pain. A review of medical records reveals no chronic anticoagulation prior to arrival  Baseline Labs: Hgb 7.9, PLT wnl, INR 1.1, aPTT 34s  Goal of Therapy:  anti-Xa level  0.3-0.7 units/ml Monitor platelets by anticoagulation protocol: Yes   Plan: anti-Xa level therapeutic x 1 ---continue heparin  infusion at 1550 units/hr ---recheck anti-Xa level in 6 hours to confirm ---Continue to monitor H&H and platelets  Adriana Bolster, PharmD, BCPS 07/13/2024 7:09 AM

## 2024-07-13 NOTE — Progress Notes (Signed)
 PHARMACY - PHYSICIAN COMMUNICATION CRITICAL VALUE ALERT - BLOOD CULTURE IDENTIFICATION (BCID)  Results for orders placed or performed during the hospital encounter of 07/11/24  Culture, blood (routine x 2)     Status: None (Preliminary result)   Collection Time: 07/11/24  7:51 PM   Specimen: BLOOD  Result Value Ref Range Status   Specimen Description BLOOD LEFT ANTECUBITAL  Final   Special Requests   Final    BOTTLES DRAWN AEROBIC AND ANAEROBIC Blood Culture adequate volume   Culture  Setup Time   Final    GRAM POSITIVE COCCI AEROBIC BOTTLE ONLY Organism ID to follow CRITICAL RESULT CALLED TO, READ BACK BY AND VERIFIED WITH: Ezel Vallone, PHARMACY 07/13/24 0003 JL Performed at [facility], [address] [place], [place] [address]    Culture GRAM POSITIVE COCCI  Final   Report Status PENDING  Incomplete  Culture, blood (routine x 2)     Status: None (Preliminary result)   Collection Time: 07/11/24  7:51 PM   Specimen: BLOOD  Result Value Ref Range Status   Specimen Description BLOOD RIGHT ANTECUBITAL  Final   Special Requests   Final    BOTTLES DRAWN AEROBIC AND ANAEROBIC Blood Culture results may not be optimal due to an inadequate volume of blood received in culture bottles   Culture   Final    NO GROWTH < 12 HOURS Performed at [facility], [address] [place], [place] [address]    Report Status PENDING  Incomplete  Blood Culture ID Panel (Reflexed)     Status: Abnormal   Collection Time: 07/11/24  7:51 PM  Result Value Ref Range Status   Enterococcus faecalis NOT DETECTED NOT DETECTED Final   Enterococcus Faecium NOT DETECTED NOT DETECTED Final   Listeria monocytogenes NOT DETECTED NOT DETECTED Final   Staphylococcus species DETECTED (A) NOT DETECTED Final    Comment: CRITICAL RESULT CALLED TO, READ BACK BY AND VERIFIED WITH: Dixie Coppa, PHARMACY 07/13/24 0003 JL    Staphylococcus aureus (BCID) NOT DETECTED NOT DETECTED Final   Staphylococcus  epidermidis DETECTED (A) NOT DETECTED Final    Comment: Methicillin (oxacillin) resistant coagulase negative staphylococcus. Possible blood culture contaminant (unless isolated from more than one blood culture draw or clinical case suggests pathogenicity). No antibiotic treatment is indicated for blood  culture contaminants. CRITICAL RESULT CALLED TO, READ BACK BY AND VERIFIED WITH: Malcomb Gangemi, PHARMACY 07/13/24 0003 JL    Staphylococcus lugdunensis NOT DETECTED NOT DETECTED Final   Streptococcus species NOT DETECTED NOT DETECTED Final   Streptococcus agalactiae NOT DETECTED NOT DETECTED Final   Streptococcus pneumoniae NOT DETECTED NOT DETECTED Final   Streptococcus pyogenes NOT DETECTED NOT DETECTED Final   A.calcoaceticus-baumannii NOT DETECTED NOT DETECTED Final   Bacteroides fragilis NOT DETECTED NOT DETECTED Final   Enterobacterales NOT DETECTED NOT DETECTED Final   Enterobacter cloacae complex NOT DETECTED NOT DETECTED Final   Escherichia coli NOT DETECTED NOT DETECTED Final   Klebsiella aerogenes NOT DETECTED NOT DETECTED Final   Klebsiella oxytoca NOT DETECTED NOT DETECTED Final   Klebsiella pneumoniae NOT DETECTED NOT DETECTED Final   Proteus species NOT DETECTED NOT DETECTED Final   Salmonella species NOT DETECTED NOT DETECTED Final   Serratia marcescens NOT DETECTED NOT DETECTED Final   Haemophilus influenzae NOT DETECTED NOT DETECTED Final   Neisseria meningitidis NOT DETECTED NOT DETECTED Final   Pseudomonas aeruginosa NOT DETECTED NOT DETECTED Final   Stenotrophomonas maltophilia NOT DETECTED NOT DETECTED Final   Candida albicans  NOT DETECTED NOT DETECTED Final   Candida auris NOT DETECTED NOT DETECTED Final   Candida glabrata NOT DETECTED NOT DETECTED Final   Candida krusei NOT DETECTED NOT DETECTED Final   Candida parapsilosis NOT DETECTED NOT DETECTED Final   Candida tropicalis NOT DETECTED NOT DETECTED Final   Cryptococcus neoformans/gattii NOT DETECTED NOT  DETECTED Final   Methicillin resistance mecA/C DETECTED (A) NOT DETECTED Final    Comment: CRITICAL RESULT CALLED TO, READ BACK BY AND VERIFIED WITH: Chamya Hunton, PHARMACY 07/13/24 0003 JL Performed at [facility], [address] [place], [place] [address]   Resp panel by RT-PCR (RSV, Flu A&B, Covid) Anterior Nasal Swab     Status: None   Collection Time: 07/11/24 10:35 PM   Specimen: Anterior Nasal Swab  Result Value Ref Range Status   SARS Coronavirus 2 by RT PCR NEGATIVE NEGATIVE Final    Comment: (NOTE) SARS-CoV-2 target nucleic acids are NOT DETECTED.  The SARS-CoV-2 RNA is generally detectable in upper respiratory specimens during the acute phase of infection. The lowest concentration of SARS-CoV-2 viral copies this assay can detect is 138 copies/mL. A negative result does not preclude SARS-Cov-2 infection and should not be used as the sole basis for treatment or other patient management decisions. A negative result may occur with  improper specimen collection/handling, submission of specimen other than nasopharyngeal swab, presence of viral mutation(s) within the areas targeted by this assay, and inadequate number of viral copies(<138 copies/mL). A negative result must be combined with clinical observations, patient history, and epidemiological information. The expected result is Negative.  Fact Sheet for Patients:  BloggerCourse.com  Fact Sheet for Healthcare Providers:  SeriousBroker.it  This test is no t yet approved or cleared by the [place]  FDA and  has been authorized for detection and/or diagnosis of SARS-CoV-2 by FDA under an Emergency Use Authorization (EUA). This EUA will remain  in effect (meaning this test can be used) for the duration of the COVID-19 declaration under Section 564(b)(1) of the Act, 21 U.S.C.section 360bbb-3(b)(1), unless the authorization is terminated  or revoked sooner.        Influenza A by PCR NEGATIVE NEGATIVE Final   Influenza B by PCR NEGATIVE NEGATIVE Final    Comment: (NOTE) The Xpert Xpress SARS-CoV-2/FLU/RSV plus assay is intended as an aid in the diagnosis of influenza from Nasopharyngeal swab specimens and should not be used as a sole basis for treatment. Nasal washings and aspirates are unacceptable for Xpert Xpress SARS-CoV-2/FLU/RSV testing.  Fact Sheet for Patients: BloggerCourse.com  Fact Sheet for Healthcare Providers: SeriousBroker.it  This test is not yet approved or cleared by the [place]  FDA and has been authorized for detection and/or diagnosis of SARS-CoV-2 by FDA under an Emergency Use Authorization (EUA). This EUA will remain in effect (meaning this test can be used) for the duration of the COVID-19 declaration under Section 564(b)(1) of the Act, 21 U.S.C. section 360bbb-3(b)(1), unless the authorization is terminated or revoked.     Resp Syncytial Virus by PCR NEGATIVE NEGATIVE Final    Comment: (NOTE) Fact Sheet for Patients: BloggerCourse.com  Fact Sheet for Healthcare Providers: SeriousBroker.it  This test is not yet approved or cleared by the [place]  FDA and has been authorized for detection and/or diagnosis of SARS-CoV-2 by FDA under an Emergency Use Authorization (EUA). This EUA will remain in effect (meaning this test can be used) for the duration of the COVID-19 declaration under Section 564(b)(1) of the Act, 21 U.S.C. section 360bbb-3(b)(1),  unless the authorization is terminated or revoked.  Performed at [facility], [address] [place], [place] [address]   MRSA Next Gen by PCR, Nasal     Status: None   Collection Time: 07/11/24 10:35 PM   Specimen: Nasal Mucosa; Nasal Swab  Result Value Ref Range Status   MRSA by PCR Next Gen NOT DETECTED NOT DETECTED Final    Comment:  (NOTE) The GeneXpert MRSA Assay (FDA approved for NASAL specimens only), is one component of a comprehensive MRSA colonization surveillance program. It is not intended to diagnose MRSA infection nor to guide or monitor treatment for MRSA infections. Test performance is not FDA approved in patients less than 57 years old. Performed at [facility], [address] [place], [place] [address]   Respiratory (~20 pathogens) panel by PCR     Status: None   Collection Time: 07/12/24  2:15 PM   Specimen: Nasopharyngeal Swab; Respiratory  Result Value Ref Range Status   Adenovirus NOT DETECTED NOT DETECTED Final   Coronavirus 229E NOT DETECTED NOT DETECTED Final    Comment: (NOTE) The Coronavirus on the Respiratory Panel, DOES NOT test for the novel  Coronavirus (2019 nCoV)    Coronavirus HKU1 NOT DETECTED NOT DETECTED Final   Coronavirus NL63 NOT DETECTED NOT DETECTED Final   Coronavirus OC43 NOT DETECTED NOT DETECTED Final   Metapneumovirus NOT DETECTED NOT DETECTED Final   Rhinovirus / Enterovirus NOT DETECTED NOT DETECTED Final   Influenza A NOT DETECTED NOT DETECTED Final   Influenza B NOT DETECTED NOT DETECTED Final   Parainfluenza Virus 1 NOT DETECTED NOT DETECTED Final   Parainfluenza Virus 2 NOT DETECTED NOT DETECTED Final   Parainfluenza Virus 3 NOT DETECTED NOT DETECTED Final   Parainfluenza Virus 4 NOT DETECTED NOT DETECTED Final   Respiratory Syncytial Virus NOT DETECTED NOT DETECTED Final   Bordetella pertussis NOT DETECTED NOT DETECTED Final   Bordetella Parapertussis NOT DETECTED NOT DETECTED Final   Chlamydophila pneumoniae NOT DETECTED NOT DETECTED Final   Mycoplasma pneumoniae NOT DETECTED NOT DETECTED Final    Comment: Performed at [facility], [address] [place], [place] [address]    BCID Results: 1 (aerobic) of 4 bottles with Staph Epi, mecA/C detected.  Pt currently on Zosyn .  WBC are WNL, pt remains afebrile. Pt was previously on Vanc x 2  doses.  Name of provider contacted: CHARLENA Nose, NP   Changes to prescribed antibiotics required: Will wait to restart Vancomycin  at this time pending additional cx results.  Rankin CANDIE Dills, PharmD, [facility] 07/13/2024 12:26 AM

## 2024-07-13 NOTE — Progress Notes (Addendum)
 [facility] CLINIC CARDIOLOGY PROGRESS NOTE       Patient ID: [name] MRN: [id number] DOB/AGE: [birth year] 63 y.o.  Admit date: 07/11/2024 Referring Physician Dr. Charlie Sellar Primary Physician Osa Geralds, NP  Primary Cardiologist Tinnie Maiden, NP  Reason for Consultation elevated troponins  HPI: [name] is a 63 y.o. 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 Found to have pneumonia, overnight troponins uptrending. Cardiology was consulted for further evaluation.   Interval history: -Patient seen and examined this AM, resting in bed.  -Weaned off BiPAP, now in 2L [place]. Feels breathing is overall improved.  -Diuresed well yesterday with IV lasix , renal function stable today.  -BP stable, HR elevated.  Review of systems complete and found to be negative unless listed above    Past Medical History:  Diagnosis Date   COPD (chronic obstructive pulmonary disease) (HCC)    CVA (cerebral infarction)    Headache    mild, since stroke 2012   Hypertension    MI (myocardial infarction) (HCC) 05/09/2012   Reading difficulty    pt reports he reads at about a second grade level   Stroke [facility]) 2012   numbness to left hand    Wears dentures    full upper and lower    Past Surgical History:  Procedure Laterality Date   CAROTID ENDARTERECTOMY Left    2012 or 2013   CHOLECYSTECTOMY N/A 07/26/2016   Procedure: LAPAROSCOPIC CHOLECYSTECTOMY;  Surgeon: Charlie FORBES Fell, MD;  Location: [facility] ORS;  Service: General;  Laterality: N/A;   COLONOSCOPY N/A 05/08/2024   Procedure: COLONOSCOPY;  Surgeon: Jinny Carmine, MD;  Location: [facility] ENDOSCOPY;   Service: Endoscopy;  Laterality: N/A;   COLONOSCOPY WITH PROPOFOL  N/A 02/09/2022   Procedure: COLONOSCOPY WITH PROPOFOL ;  Surgeon: Jinny Carmine, MD;  Location: [facility] ENDOSCOPY;  Service: Endoscopy;  Laterality: N/A;   CORONARY ARTERY BYPASS GRAFT  05/10/2012   3 vessel   CORONARY STENT INTERVENTION N/A 07/02/2024   Procedure: CORONARY STENT INTERVENTION;  Surgeon: Katina Albright, MD;  Location: [facility] INVASIVE CV LAB;  Service: Cardiovascular;  Laterality: N/A;   CORONARY ULTRASOUND/IVUS N/A 07/02/2024   Procedure: Coronary Ultrasound/IVUS;  Surgeon: Katina Albright, MD;  Location: [facility] INVASIVE CV LAB;  Service: Cardiovascular;  Laterality: N/A;   ENTEROSCOPY N/A 05/16/2023   Procedure: ENTEROSCOPY;  Surgeon: Therisa Bi, MD;  Location: [facility] ENDOSCOPY;  Service: Gastroenterology;  Laterality: N/A;   ENTEROSCOPY N/A 06/01/2023   Procedure: ENTEROSCOPY;  Surgeon: Unk Corinn Skiff, MD;  Location: [facility] ENDOSCOPY;  Service: Gastroenterology;  Laterality: N/A;   ESOPHAGOGASTRODUODENOSCOPY  05/13/2023   Procedure: ESOPHAGOGASTRODUODENOSCOPY (EGD);  Surgeon: Jinny Carmine, MD;  Location: [facility] ENDOSCOPY;  Service: Endoscopy;;   ESOPHAGOGASTRODUODENOSCOPY (EGD) WITH PROPOFOL  N/A 10/28/2022   Procedure: ESOPHAGOGASTRODUODENOSCOPY (EGD) WITH PROPOFOL ;  Surgeon: Onita Elspeth Sharper, DO;  Location: [facility] ENDOSCOPY;  Service: Gastroenterology;  Laterality: N/A;   GIVENS CAPSULE STUDY  05/13/2023   Procedure: GIVENS CAPSULE STUDY;  Surgeon: Jinny Carmine, MD;  Location: [facility] ENDOSCOPY;  Service: Endoscopy;;   GIVENS CAPSULE STUDY  06/01/2023   Procedure: GIVENS CAPSULE STUDY;  Surgeon: Unk Corinn Skiff, MD;  Location: [facility] ENDOSCOPY;  Service: Gastroenterology;;   LEFT HEART  CATH AND CORS/GRAFTS ANGIOGRAPHY N/A 07/02/2024   Procedure: LEFT HEART CATH AND CORS/GRAFTS ANGIOGRAPHY;  Surgeon: Katina Albright, MD;  Location: [facility] INVASIVE CV LAB;  Service: Cardiovascular;  Laterality: N/A;    Medications Prior to Admission   Medication Sig Dispense Refill Last Dose/Taking   albuterol  (VENTOLIN  HFA) 108 (90 Base) MCG/ACT inhaler Inhale 2 puffs into the lungs every 6 (six) hours as needed for wheezing or shortness of breath. 6.7 g 2 07/11/2024 Morning   ascorbic acid  (VITAMIN C ) 500 MG tablet Take 1 tablet (500 mg total) by mouth daily. 30 tablet 2 07/11/2024 Morning   aspirin  EC 81 MG tablet Take 1 tablet (81 mg total) by mouth daily. Swallow whole. 30 tablet 5 07/11/2024 Morning   clopidogrel  (PLAVIX ) 75 MG tablet Take 1 tablet (75 mg total) by mouth daily with breakfast. 30 tablet 11 07/11/2024 Morning   dapagliflozin  propanediol (FARXIGA ) 10 MG TABS tablet Take 1 tablet (10 mg total) by mouth daily. 30 tablet 11 07/11/2024 Morning   folic acid  (FOLVITE ) 1 MG tablet Take 1 tablet (1 mg total) by mouth daily. 30 tablet 2 07/11/2024   iron  polysaccharides (NIFEREX) 150 MG capsule Take 1 capsule (150 mg total) by mouth daily. 30 capsule 2 07/11/2024   isosorbide  mononitrate (IMDUR ) 60 MG 24 hr tablet Take 1 tablet (60 mg total) by mouth daily. 30 tablet 11 07/11/2024   losartan  (COZAAR ) 25 MG tablet Take 0.5 tablets (12.5 mg total) by mouth daily. 30 tablet 11 07/11/2024 Morning   metoprolol  succinate (TOPROL -XL) 25 MG 24 hr tablet Take 0.5 tablets (12.5 mg total) by mouth daily. 30 tablet 11 07/11/2024 Morning   pantoprazole  (PROTONIX ) 40 MG tablet Take 1 tablet (40 mg total) by mouth daily. 30 tablet 2 07/11/2024 Morning   pravastatin  (PRAVACHOL ) 20 MG tablet Take 1 tablet (20 mg total) by mouth at bedtime. 30 tablet 11 07/10/2024 Evening   spironolactone  (ALDACTONE ) 25 MG tablet Take 0.5 tablets (12.5 mg total) by mouth daily. 30 tablet 11 07/11/2024 Morning   torsemide  (DEMADEX ) 10 MG tablet Take 1 tablet (10 mg total) by mouth daily. 30 tablet 11 07/11/2024   Social History   Socioeconomic History   Marital status: Divorced    Spouse name: Not on file   Number of children: Not on file   Years of education: Not on file    Highest education level: Not on file  Occupational History   Not on file  Tobacco Use   Smoking status: Former    Current packs/day: 0.00    Types: Cigarettes    Quit date: 62    Years since quitting: 32.5   Smokeless tobacco: Current    Types: Snuff   Tobacco comments:    changed to dip  Vaping Use   Vaping status: Never Used  Substance and Sexual Activity   Alcohol  use: Yes    Alcohol /week: 26.0 standard drinks of alcohol     Types: 26 Cans of beer per week   Drug use: No   Sexual activity: Yes    Birth control/protection: None  Other Topics Concern   Not on file  Social History Narrative   Not on file   Social Drivers of Health   Financial Resource Strain: Not on file  Food Insecurity: No Food Insecurity (07/12/2024)   Hunger Vital Sign    Worried About Running Out of Food in the Last Year: Never true    Ran Out of Food in the Last Year: Never true  Transportation  Needs: No Transportation Needs (07/12/2024)   PRAPARE - Administrator, Civil Service (Medical): No    Lack of Transportation (Non-Medical): No  Physical Activity: Not on file  Stress: Not on file  Social Connections: Socially Isolated (07/12/2024)   Social Connection and Isolation Panel    Frequency of Communication with Friends and Family: Once a week    Frequency of Social Gatherings with Friends and Family: Once a week    Attends Religious Services: Never    Database administrator or Organizations: No    Attends Banker Meetings: Never    Marital Status: Divorced  Catering manager Violence: Not At Risk (07/12/2024)   Humiliation, Afraid, Rape, and Kick questionnaire    Fear of Current or Ex-Partner: No    Emotionally Abused: No    Physically Abused: No    Sexually Abused: No    Family History  Problem Relation Age of Onset   Pneumonia Mother      Vitals:   07/13/24 0700 07/13/24 0718 07/13/24 0800 07/13/24 0900  BP: 110/88  105/66 (!) 145/86  Pulse: (!) 114 (!) 114  (!) 117 (!) 122  Resp: (!) 21 17 (!) 25 (!) 24  Temp:   98.2 F (36.8 C)   TempSrc:      SpO2: 99% 99% 100% 99%  Weight:      Height:        PHYSICAL EXAM General: Ill appearing male, well nourished, in no acute distress. HEENT: Normocephalic and atraumatic. Neck: No JVD.  Lungs: Normal respiratory effort on 2L [place]. Heart: HRRR, elevated rate. Normal S1 and S2 without gallops. +systolic ejection murmur.   Abdomen: Non-distended appearing.  Msk: Normal strength and tone for age. Extremities: Warm and well perfused. No clubbing, cyanosis. Trace edema.  Neuro: Alert and oriented X 3. Psych: Answers questions appropriately.   Labs: Basic Metabolic Panel: Recent Labs    07/12/24 0215 07/12/24 0900 07/13/24 0420  NA 136  --  134*  K 4.5  --  3.8  CL 102  --  99  CO2 15*  --  26  GLUCOSE 167*  --  183*  BUN 13  --  22  CREATININE 0.94  --  0.90  CALCIUM  8.6*  --  8.5*  MG  --  1.8 2.1   Liver Function Tests: Recent Labs    07/11/24 1706 07/12/24 0900  AST 21 62*  ALT 16 18  ALKPHOS 43 45  BILITOT 0.9 0.9  PROT 7.3 7.3  ALBUMIN 3.5 3.5   No results for input(s): LIPASE, AMYLASE in the last 72 hours. CBC: Recent Labs    07/11/24 1706 07/11/24 2137 07/13/24 0006 07/13/24 0420  WBC 8.6   < > 11.1* 13.0*  NEUTROABS 4.9  --   --   --   HGB 8.3*   < > 7.7* 7.5*  HCT 26.7*   < > 25.2* 24.0*  MCV 86.1   < > 85.7 85.1  PLT 339   < > 298 278   < > = values in this interval not displayed.   Cardiac Enzymes: Recent Labs    07/12/24 0532 07/12/24 1537 07/12/24 1740  TROPONINIHS 1,862* 5,766* 5,756*   BNP: Recent Labs    07/11/24 1706 07/12/24 0900  BNP 222.2* 1,552.7*   D-Dimer: No results for input(s): DDIMER in the last 72 hours. Hemoglobin A1C: No results for input(s): HGBA1C in the last 72 hours. Fasting Lipid Panel: No results for  input(s): CHOL, HDL, LDLCALC, TRIG, CHOLHDL, LDLDIRECT in the last 72 hours. Thyroid Function  Tests: No results for input(s): TSH, T4TOTAL, T3FREE, THYROIDAB in the last 72 hours.  Invalid input(s): FREET3 Anemia Panel: No results for input(s): VITAMINB12, FOLATE, FERRITIN, TIBC, IRON , RETICCTPCT in the last 72 hours.   Radiology: [facility] Chest Port 1 View Result Date: 07/12/2024 CLINICAL DATA:  Shortness of breath. EXAM: PORTABLE CHEST 1 VIEW COMPARISON:  07/11/2024 FINDINGS: The cardio pericardial silhouette is enlarged. There is pulmonary vascular congestion without overt pulmonary edema. Trace bilateral pleural effusions. Old posterior left sixth rib fracture. Telemetry leads overlie the chest. IMPRESSION: Enlargement of the cardiopericardial silhouette with new pulmonary vascular congestion and trace bilateral pleural effusions. Electronically Signed   By: Camellia Candle M.D.   On: 07/12/2024 12:11   CT Angio Chest PE W/Cm &/Or Wo Cm Result Date: 07/11/2024 CLINICAL DATA:  High probability for PE. Chest pain with shortness of breath EXAM: CT ANGIOGRAPHY CHEST WITH CONTRAST TECHNIQUE: Multidetector CT imaging of the chest was performed using the standard protocol during bolus administration of intravenous contrast. Multiplanar CT image reconstructions and MIPs were obtained to evaluate the vascular anatomy. RADIATION DOSE REDUCTION: This exam was performed according to the departmental dose-optimization program which includes automated exposure control, adjustment of the mA and/or kV according to patient size and/or use of iterative reconstruction technique. CONTRAST:  75mL OMNIPAQUE  IOHEXOL  350 MG/ML SOLN COMPARISON:  CT angiogram chest 05/09/2024 FINDINGS: Cardiovascular: Satisfactory opacification of the pulmonary arteries to the segmental level. No evidence of pulmonary embolism. Patient is status post cardiac surgery. The heart is enlarged. There is no pericardial effusion. There are atherosclerotic calcifications of the aorta and coronary arteries. Aorta is normal in  size. Mediastinum/Nodes: There are enlarged right hilar lymph nodes measuring up to 11 mm. Enlarged paratracheal lymph nodes measure up to 10 mm. Visualized thyroid gland and esophagus are within normal limits. Lungs/Pleura: Moderate emphysema again seen. There is stable scarring in the right lung apex. There is new patchy slightly nodular airspace disease in the right upper lobe measuring 2.0 by 1.1 cm. There is right-sided central peribronchial wall thickening. Previously identified airspace disease in the left lower lobe has resolved. There is no pleural effusion or pneumothorax. There is a stable left lower lobe pulmonary nodule image 6/57. Upper Abdomen: No acute abnormality. Musculoskeletal: No chest wall abnormality. No acute or significant osseous findings. Review of the MIP images confirms the above findings. IMPRESSION: 1. No evidence for pulmonary embolism. 2. New patchy slightly nodular airspace disease in the right upper lobe worrisome for pneumonia. Follow-up CT recommended in 4-6 weeks recommended to ensure resolution and to exclude underlying nodule. 3. Right-sided central peribronchial wall thickening worrisome for bronchitis. 4. Right hilar and mediastinal lymphadenopathy, likely reactive. 5. Stable left lower lobe pulmonary nodule. 6. Cardiomegaly. Aortic Atherosclerosis (ICD10-I70.0) and Emphysema (ICD10-J43.9). Electronically Signed   By: Greig Pique M.D.   On: 07/11/2024 18:54   DG Chest Portable 1 View Result Date: 07/11/2024 CLINICAL DATA:  SOB EXAM: PORTABLE CHEST - 1 VIEW COMPARISON:  June 24, 2024 FINDINGS: Biapical pleural thickening. Sternotomy wires and CABG markers. No focal airspace consolidation, pleural effusion, or pneumothorax. Mild cardiomegaly. Tortuous aorta with aortic atherosclerosis. No acute fracture or destructive lesions. Multilevel thoracic osteophytosis. IMPRESSION: No acute cardiopulmonary abnormality. Electronically Signed   By: Rogelia Myers M.D.   On:  07/11/2024 17:25   CARDIAC CATHETERIZATION Result Date: 07/02/2024   Prox LAD to Mid LAD lesion is 90% stenosed.  Ost LM to Mid LM lesion is 50% stenosed.   Ost Cx to Prox Cx lesion is 90% stenosed.   Ost RCA to Prox RCA lesion is 100% stenosed.   Mid Graft to Dist Graft lesion is 95% stenosed.   Recommend uninterrupted dual antiplatelet therapy with Aspirin  81mg  daily and Clopidogrel  75mg  daily for a minimum of 6 months (stable ischemic heart disease-Class I recommendation). 1.  Severe native three-vessel CAD 2.  LIMA to LAD and SVG to OM widely patent 3.  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  Successful direct stenting of the distal SVG to PDA with distal protection with a 4.0 x 15 mm drug-eluting stent with intravascular ultrasound guidance 5.  Aspirin  and clopidogrel  for at least 6 months followed by clopidogrel  indefinitely 6.  Continue workup for aortic valve replacement   DG Chest 2 View Result Date: 06/24/2024 CLINICAL DATA:  sob EXAM: CHEST - 2 VIEW COMPARISON:  June 03, 2024 FINDINGS: Biapical pleural thickening. No focal airspace consolidation, pleural effusion, or pneumothorax. No cardiomegaly. Sternotomy wires and CABG markers. Tortuous aorta with aortic atherosclerosis. No acute fracture or destructive lesions. Multilevel thoracic osteophytosis. Osteopenia. IMPRESSION: No acute cardiopulmonary abnormality. Electronically Signed   By: Rogelia Myers M.D.   On: 06/24/2024 11:54    ECHO 04/2024: 1. Left ventricular ejection fraction, by estimation, is 60 to 65%. The left ventricle has normal function. The left ventricle has no regional wall motion abnormalities. The left ventricular internal cavity size was mildly dilated. Left ventricular diastolic parameters are consistent with Grade I diastolic dysfunction (impaired relaxation).   2. Right ventricular systolic function is normal. The right ventricular  size is normal.   3. The mitral valve is normal in structure. No  evidence of mitral valve  regurgitation.   4. The aortic valve is normal in structure. Aortic valve regurgitation is  trivial. Severe aortic valve stenosis.   TELEMETRY reviewed by me 07/13/2024: sinus tachy 110s  EKG reviewed by me: sinus tachycardia ST depression, rate 129 bpm  Data reviewed by me 07/13/2024: last 24h vitals tele labs imaging I/O hospitalist progress note, PCCM notes  Principal Problem:   Acute respiratory distress Active Problems:   Hyperlipidemia   Cognitive impairment   Alcohol  abuse   Essential hypertension   CAD (coronary artery disease)   Stroke (HCC)   Iron  deficiency anemia   Acute on chronic diastolic CHF (congestive heart failure) (HCC)   COPD exacerbation (HCC)   Elevated lactic acid level   Lobar pneumonia (HCC)   Metabolic acidosis, increased anion gap   Myocardial injury    ASSESSMENT AND PLAN:  [name] is a 63 y.o. 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 Found to have pneumonia, overnight troponins uptrending. Cardiology was consulted for further evaluation.   # Acute hypoxic respiratory failure # HCAP # COPD exacerbation # Lactic acidosis # Acute on chronic HFpEF (?flash pulmonary edema) # Severe aortic stenosis Presented with SOB, cough, imaging concerning for HCAP/COPD exacerbation. LA elevated on presentation at 3.6 > 5.9. BNP initially 222 up to 1500 this AM. With worsening respiratory status 7/26, started on BiPAP and transferred  to the ICU. Brisk diuresis 7/26 net negative 3.4L today.  -Appreciate PCCM. BiPAP yesterday for increased WOB. Now weaned to 2L [place].  -Continue IV lasix  40 mg  bid. -Further management of COPD exacerbation/PNA as per primary team.  -CIWA protocol given hx of alcohol  abuse. -Scheduled to see Dr. Katina 7/30 to discuss aortic valve repair/replacement options. Goal to optimize him from volume and symptom standpoint so he can be discharged prior to this appointment.  # Elevated troponin # NSTEMI, suspect type II # Coronary artery disease s/p CABG x3 (04/2012) # Hypertension # Hyperlipidemia With recent LHC, s/p DES to SVG to PDA 07/02/2024. Reported some CP when he first came to hospital which has now resolved.  Troponin trended 44 > 144 > 205 > 980 > 1862 > 5766 > 5756. Cr stable at 0.9. -Resume home plavix  75 mg daily. Continue aspirin  81 mg daily. DAPT given recent DES. -Started on heparin  gtt by [facility] 7/26, Hgb down to 7.5 this AM. Will continue this through tonight and DC in the AM.  -Continue home pravastatin  20 mg daily. -Will resume home metoprolol  succinate 12.5 mg daily. 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  This patient's plan of care was discussed and created with Dr. Florencio and he is in agreement.  Signed: Danita Bloch, PA-C  07/13/2024, 9:33 AM [facility]

## 2024-07-13 NOTE — Progress Notes (Signed)
 PHARMACY - ANTICOAGULATION CONSULT NOTE  Pharmacy Consult for heparin  infusion Indication: chest pain/ACS  Allergies  Allergen Reactions   Wellbutrin  [Bupropion ]     Paranoia     Patient Measurements: Height: 6' (182.9 cm) Weight: 100.1 kg (220 lb 10.9 oz) IBW/kg (Calculated) : 77.6 HEPARIN  DW (KG): 95  Vital Signs: Temp: 97.8 F (36.6 C) (07/26 1600) Temp Source: Oral (07/26 2000) BP: 118/66 (07/27 0100) Pulse Rate: 113 (07/27 0100)  Labs: Recent Labs    07/11/24 1706 07/11/24 1951 07/12/24 0215 07/12/24 0532 07/12/24 0900 07/12/24 1439 07/12/24 1537 07/12/24 1740 07/13/24 0006  HGB 8.3*   < > 8.0*  --  8.2* 7.9*  --   --  7.7*  HCT 26.7*   < > 26.0*  --  26.6* 25.1*  --   --  25.2*  PLT 339   < > 293  --  328 271  --   --  298  APTT 34  --   --   --   --   --   --   --   --   LABPROT 15.3*  --   --   --   --   --   --   --   --   INR 1.1  --   --   --   --   --   --   --   --   HEPARINUNFRC  --   --   --   --   --   --   --   --  0.21*  CREATININE 0.91  --  0.94  --   --   --   --   --   --   TROPONINIHS 44*   < > 980* 1,862*  --   --  5,766* 5,756*  --    < > = values in this interval not displayed.    Estimated Creatinine Clearance: 96 mL/min (by C-G formula based on SCr of 0.94 mg/dL).   Medical History: Past Medical History:  Diagnosis Date   COPD (chronic obstructive pulmonary disease) (HCC)    CVA (cerebral infarction)    Headache    mild, since stroke 2012   Hypertension    MI (myocardial infarction) (HCC) 05/09/2012   Reading difficulty    pt reports he reads at about a second grade level   Stroke (HCC) 2012   numbness to left hand    Wears dentures    full upper and lower    Medications:  Scheduled:   ascorbic acid   500 mg Oral Daily   aspirin  EC  81 mg Oral Daily   Chlorhexidine  Gluconate Cloth  6 each Topical Q0600   clopidogrel   75 mg Oral Daily   folic acid   1 mg Oral Daily   furosemide   40 mg Intravenous BID    ipratropium-albuterol   3 mL Nebulization Q6H   iron  polysaccharides  150 mg Oral Daily   methylPREDNISolone  (SOLU-MEDROL ) injection  40 mg Intravenous Q24H   multivitamin with minerals  1 tablet Oral Daily   pantoprazole  (PROTONIX ) IV  40 mg Intravenous Q12H   pravastatin   20 mg Oral QHS   thiamine   100 mg Oral Daily   Or   thiamine   100 mg Intravenous Daily   Infusions:   heparin  1,350 Units/hr (07/13/24 0100)   piperacillin -tazobactam (ZOSYN )  IV 12.5 mL/hr at 07/13/24 0100    Assessment: 63 y.o. male with medical history significant for  CAD S/P CABG,  chronic HFpEF, severe aortic stenosis, HTN, HLD, obesity, COPD, recent GI bleed, alcohol  use disorder presented to [facility] for SOB, cough, chest pain. A review of medical records reveals no chronic anticoagulation prior to arrival  Baseline Labs: Hgb 7.9, PLT wnl, INR 1.1, aPTT 34s  Goal of Therapy:  anti-Xa level  0.3-0.7 units/ml Monitor platelets by anticoagulation protocol: Yes  07/27 0006 HL 0.21, subtherapeutic   Plan:  ---will give 1500 units bolus x 1 ---increase heparin  infusion to 1550 units/hr ---recheck anti-Xa level in 6 hours and at least once daily while on heparin  ---Continue to monitor H&H and platelets [place] CANDIE Dills, PharmD, [facility] 07/13/2024 1:33 AM

## 2024-07-13 NOTE — Plan of Care (Signed)
  Problem: Clinical Measurements: Goal: Diagnostic test results will improve Outcome: Progressing   Problem: Elimination: Goal: Will not experience complications related to urinary retention Outcome: Progressing   Problem: Safety: Goal: Ability to remain free from injury will improve Outcome: Progressing   Problem: Respiratory: Goal: Ability to maintain a clear airway will improve Outcome: Progressing Goal: Levels of oxygenation will improve Outcome: Progressing Goal: Ability to maintain adequate ventilation will improve Outcome: Progressing   Problem: Respiratory: Goal: Ability to maintain adequate ventilation will improve Outcome: Progressing Goal: Ability to maintain a clear airway will improve Outcome: Progressing   Problem: Education: Goal: Knowledge of General Education information will improve Description: Including pain rating scale, medication(s)/side effects and non-pharmacologic comfort measures Outcome: Not Progressing   Problem: Health Behavior/Discharge Planning: Goal: Ability to manage health-related needs will improve Outcome: Not Progressing   Problem: Clinical Measurements: Goal: Ability to maintain clinical measurements within normal limits will improve Outcome: Not Progressing Goal: Will remain free from infection Outcome: Not Progressing Goal: Respiratory complications will improve Outcome: Not Progressing Goal: Cardiovascular complication will be avoided Outcome: Not Progressing   Problem: Activity: Goal: Risk for activity intolerance will decrease Outcome: Not Progressing   Problem: Nutrition: Goal: Adequate nutrition will be maintained Outcome: Not Progressing   Problem: Coping: Goal: Level of anxiety will decrease Outcome: Not Progressing   Problem: Elimination: Goal: Will not experience complications related to bowel motility Outcome: Not Progressing   Problem: Pain Managment: Goal: General experience of comfort will improve  and/or be controlled Outcome: Not Progressing   Problem: Skin Integrity: Goal: Risk for impaired skin integrity will decrease Outcome: Not Progressing   Problem: Education: Goal: Knowledge of disease or condition will improve Outcome: Not Progressing Goal: Knowledge of the prescribed therapeutic regimen will improve Outcome: Not Progressing   Problem: Activity: Goal: Ability to tolerate increased activity will improve Outcome: Not Progressing Goal: Will verbalize the importance of balancing activity with adequate rest periods Outcome: Not Progressing   Problem: Activity: Goal: Ability to tolerate increased activity will improve Outcome: Not Progressing   Problem: Clinical Measurements: Goal: Ability to maintain a body temperature in the normal range will improve Outcome: Not Progressing

## 2024-07-13 NOTE — Progress Notes (Signed)
 Progress Note   Patient: [name] FMW:[id number] DOB: 12-03-1961 DOA: 07/11/2024     2 DOS: the patient was seen and examined on 07/13/2024   Brief hospital course: 63 y.o. male with medical history significant of  GIB, alcohol  abuse, COPD on 2 L oxygen, hypertension, CAD with prior CABG and recent Stent placement, HLD, stroke, dementia, dCHF, presents with SOB, cough, chest pain.   Patient was recently hospitalized from 7/16 - 7/17 for elective LHC. Pt was s/p of successful direct stenting of distal SVG to PDA. Pt states that he has SOB and productive cough with yellow-colored sputum production in the past several days, which has been progressively worsening.  He also complains of chest pain, which is located in the right side of chest.  CTA negative for PE, showed patchy infiltration in the right upper lobe.  Patient is admitted to telemetry bed as inpatient.   7/26.  Developed significant respite distress  required BiPAP.  Chemistry showing increased anion gap acidosis, troponin elevated at 1862.  ABG shows a pH of 7.36PO2 of 364 PCO2 of 38 and O2 saturation 100% and I was done on a nonrebreather mask, then BiPAP.  Repeated BNP significant elevated: patient was started on IV Lasix  for flash pulm edema.     Principal Problem:   Acute respiratory distress Active Problems:   COPD exacerbation (HCC)   Lobar pneumonia (HCC)   Elevated lactic acid level   Metabolic acidosis, increased anion gap   Essential hypertension   Acute on chronic diastolic CHF (congestive heart failure) (HCC)   Hyperlipidemia   Stroke (HCC)   CAD (coronary artery disease)   Iron  deficiency anemia   Alcohol  abuse   Cognitive impairment   Myocardial injury   Assessment and Plan: * Acute on chronic respiratory respiratory failure with hypoxemia. COPD exacerbation. Acute on chronic diastolic congestive heart failure Moderate to severe aortic valve stenosis Patient had a severe respite distress at the time of  admission requiring BiPAP.  Patient had both COPD aspiration and exacerbation congestive heart failure. Appreciate pulmonology consult.  Will continue steroids and IV Lasix . Patient respite status appears to be better this morning, but developed alcohol  withdrawal.  Will continue to monitor patient closely in the stepdown unit.  Non-ST elevation myocardial infarction. Patient had chest pain at presentation, peak troponin 5766.  Patient had recent coronary angiogram with stent placement.  Currently followed by cardiology, off heparin  drip.  Will follow closely in the ICU  Delirium secondary to alcohol  withdrawal. He was very agitated, climb out of the bed.  On CIWA protocol, did not seem to control the symptoms, pulmonology has started the phenobarbital  taper.  Will monitor patient closely.  Acute on chronic anemia. Iron  deficient anemia. Hemoglobin dropped down to 7.5, with baseline hemoglobin around 9.  Patient has not noticed any black stools or rectal bleeding.  Reviewed liver ultrasound performed in 2023, patient had significant hepatic steatosis, will repeat ultrasound to make sure patient does not have portal hypertension given heavy alcohol  drinking.   Will monitor hemoglobin every 6 hours, transfuse as needed.  Will also obtain ultrasound for possible portal hypertension.  Obtain GI consult.  For now start Protonix .   Lobar pneumonia (HCC) likely aspiration pneumonia. Seen on CT scan.  Patient is started on Zosyn .  Will obtain speech therapy evaluation with patient condition is more stable.  Metabolic acidosis, increased anion gap Severe lactic acidosis. Lactic acidosis could be due to liver disease, which appears to be improving.  Hyperlipidemia  On pravastatin   Cognitive impairment Continue to monitor  Patient condition is critical, high risk of deterioration.  Patient will be continued to be followed by ICU team.      Subjective:  Has significant agitation with some  confusion.  Short of breath is better.  Still on 2 L oxygen.  Physical Exam: Vitals:   07/13/24 0700 07/13/24 0718 07/13/24 0800 07/13/24 0900  BP: 110/88  105/66 (!) 145/86  Pulse: (!) 114 (!) 114 (!) 117 (!) 122  Resp: (!) 21 17 (!) 25 (!) 24  Temp:   98.2 F (36.8 C)   TempSrc:      SpO2: 99% 99% 100% 99%  Weight:      Height:       General exam: Appears calm and comfortable  Respiratory system: Decreased breath sounds. Respiratory effort normal. Cardiovascular system: Regular and tachycardic. No JVD, murmurs, rubs, gallops or clicks. No pedal edema. Gastrointestinal system: Abdomen is nondistended, soft and nontender. No organomegaly or masses felt. Normal bowel sounds heard. Central nervous system: Alert and oriented x2. No focal neurological deficits. Extremities: Symmetric 5 x 5 power. Skin: No rashes, lesions or ulcers Psychiatry: Agitated   Data Reviewed:  Reviewed CT scan, chest x-ray and the lab results.  Family Communication: None  Disposition: Status is: Inpatient CRITICAL CARE Performed by: Murvin Mana   Total critical care time: 50 minutes  Critical care time was exclusive of separately billable procedures and treating other patients.  Critical care was necessary to treat or prevent imminent or life-threatening deterioration.  Critical care was time spent personally by me on the following activities: development of treatment plan with patient and/or surrogate as well as nursing, discussions with consultants, evaluation of patient's response to treatment, examination of patient, obtaining history from patient or surrogate, ordering and performing treatments and interventions, ordering and review of laboratory studies, ordering and review of radiographic studies, pulse oximetry and re-evaluation of patient's condition.        Author: Murvin Mana, MD 07/13/2024 10:53 AM  For on call review [email or web address].

## 2024-07-13 NOTE — Progress Notes (Signed)
 PHARMACY - ANTICOAGULATION CONSULT NOTE  Pharmacy Consult for heparin  infusion Indication: chest pain/ACS  Allergies  Allergen Reactions   Wellbutrin  [Bupropion ]     Paranoia     Patient Measurements: Height: 6' (182.9 cm) Weight: 97.8 kg (215 lb 9.8 oz) IBW/kg (Calculated) : 77.6 HEPARIN  DW (KG): 95  Vital Signs: Temp: 98.2 F (36.8 C) (07/27 0800) Temp Source: Oral (07/27 0600) BP: 145/86 (07/27 0900) Pulse Rate: 122 (07/27 0900)  Labs: Recent Labs    07/11/24 1706 07/11/24 1951 07/12/24 0215 07/12/24 0532 07/12/24 0900 07/12/24 1439 07/12/24 1537 07/12/24 1740 07/13/24 0006 07/13/24 0420 07/13/24 1138  HGB 8.3*   < > 8.0*  --    < > 7.9*  --   --  7.7* 7.5* 7.9*  HCT 26.7*   < > 26.0*  --    < > 25.1*  --   --  25.2* 24.0*  --   PLT 339   < > 293  --    < > 271  --   --  298 278  --   APTT 34  --   --   --   --   --   --   --   --   --   --   LABPROT 15.3*  --   --   --   --   --   --   --   --   --   --   INR 1.1  --   --   --   --   --   --   --   --   --   --   HEPARINUNFRC  --   --   --   --   --   --   --   --  0.21* 0.42 0.41  CREATININE 0.91  --  0.94  --   --   --   --   --   --  0.90  --   TROPONINIHS 44*   < > 980* 1,862*  --   --  5,766* 5,756*  --   --   --    < > = values in this interval not displayed.    Estimated Creatinine Clearance: 99.2 mL/min (by C-G formula based on SCr of 0.9 mg/dL).   Medical History: Past Medical History:  Diagnosis Date   COPD (chronic obstructive pulmonary disease) (HCC)    CVA (cerebral infarction)    Headache    mild, since stroke 2012   Hypertension    MI (myocardial infarction) (HCC) 05/09/2012   Reading difficulty    pt reports he reads at about a second grade level   Stroke (HCC) 2012   numbness to left hand    Wears dentures    full upper and lower    Medications:  Scheduled:   ascorbic acid   500 mg Oral Daily   aspirin  EC  81 mg Oral Daily   Chlorhexidine  Gluconate Cloth  6 each Topical  Q0600   clopidogrel   75 mg Oral Daily   folic acid   1 mg Oral Daily   furosemide   40 mg Intravenous BID   ipratropium-albuterol   3 mL Nebulization BID   iron  polysaccharides  150 mg Oral Daily   methylPREDNISolone  (SOLU-MEDROL ) injection  40 mg Intravenous Q24H   metoprolol  succinate  12.5 mg Oral Daily   multivitamin with minerals  1 tablet Oral Daily   pantoprazole  (PROTONIX ) IV  40 mg Intravenous Q12H  PHENObarbital   97.5 mg Intravenous Q8H   Followed by   NOREEN ON 07/15/2024] PHENObarbital   65 mg Intravenous Q8H   Followed by   NOREEN ON 07/17/2024] PHENObarbital   32.5 mg Intravenous Q8H   pravastatin   20 mg Oral QHS   thiamine   100 mg Oral Daily   Or   thiamine   100 mg Intravenous Daily   Infusions:   heparin  1,550 Units/hr (07/13/24 0600)   piperacillin -tazobactam (ZOSYN )  IV 3.375 g (07/13/24 0630)    Assessment: 63 y.o. male with medical history significant for  CAD S/P CABG, chronic HFpEF, severe aortic stenosis, HTN, HLD, obesity, COPD, recent GI bleed, alcohol  use disorder presented to [facility] for SOB, cough, chest pain. A review of medical records reveals no chronic anticoagulation prior to arrival  Baseline Labs: Hgb 7.9, PLT wnl, INR 1.1, aPTT 34s  Goal of Therapy:  anti-Xa level  0.3-0.7 units/ml Monitor platelets by anticoagulation protocol: Yes   Plan: anti-Xa level therapeutic x 2 ---continue heparin  infusion at 1550 units/hr ---recheck next anti-Xa level in am 07/28 ---Continue to monitor H&H and platelets  Adriana Bolster, PharmD, BCPS 07/13/2024 12:13 PM

## 2024-07-13 NOTE — Consult Note (Signed)
 Inpatient Consultation   Patient ID: [name] is a 63 y.o. male.  Requesting Provider: Murvin Mana, MD  Date of Admission: 07/11/2024  Date of Consult: 07/13/24   Reason for Consultation: Anemia   Patient's Chief Complaint:   Chief Complaint  Patient presents with   Shortness of Breath    63 year old Caucasian male with alcohol  abuse, COPD, CVA, CAD status post recent PCI/DES now on Plavix , tobacco dependence, HFpEF, severe aortic stenosis who presents to the hospital shortness of breath.  GI is consulted for anemia. Actively going through alcohol  withdrawal so history from the patient is limited.  Patient found to have pneumonia and experiencing an NSTEMI.  He was already on DAPT due to his recent stent placement.  He is now additionally on heparin  drip.  Shortness of breath requiring BiPAP overnight-usually on 2 L nasal cannula at baseline.  He does report some diarrhea.  Nursing has not appreciated any signs of GI bleeding.  Hemoglobin baseline between 8 and 9.  Today it is 7.9 without intervention.  He is hypertensive and tachycardic.  Known history of AVMs.  He has undergone multiple bidirectional endoscopies for similar reason.  Denies any nausea vomiting or abdominal pain.  Previous notation without family history of gastrointestinal disease and malignancy Previous Endoscopies: Multiple bidirectional endoscopies without any findings for bleeding within the last two years. VCE Revealed avms which would require retrograde endoscopy, which is not available at [facility].    Past Medical History:  Diagnosis Date   COPD (chronic obstructive pulmonary disease) (HCC)    CVA (cerebral infarction)    Headache    mild, since stroke 2012   Hypertension    MI (myocardial infarction) (HCC) 05/09/2012   Reading difficulty    pt reports he reads at about a second grade level   Stroke [facility]) 2012   numbness to left hand    Wears dentures    full upper and lower    Past  Surgical History:  Procedure Laterality Date   CAROTID ENDARTERECTOMY Left    2012 or 2013   CHOLECYSTECTOMY N/A 07/26/2016   Procedure: LAPAROSCOPIC CHOLECYSTECTOMY;  Surgeon: Charlie FORBES Fell, MD;  Location: [facility] ORS;  Service: General;  Laterality: N/A;   COLONOSCOPY N/A 05/08/2024   Procedure: COLONOSCOPY;  Surgeon: Jinny Carmine, MD;  Location: [facility] ENDOSCOPY;  Service: Endoscopy;  Laterality: N/A;   COLONOSCOPY WITH PROPOFOL  N/A 02/09/2022   Procedure: COLONOSCOPY WITH PROPOFOL ;  Surgeon: Jinny Carmine, MD;  Location: [facility] ENDOSCOPY;  Service: Endoscopy;  Laterality: N/A;   CORONARY ARTERY BYPASS GRAFT  05/10/2012   3 vessel   CORONARY STENT INTERVENTION N/A 07/02/2024   Procedure: CORONARY STENT INTERVENTION;  Surgeon: Katina Albright, MD;  Location: [facility] INVASIVE CV LAB;  Service: Cardiovascular;  Laterality: N/A;   CORONARY ULTRASOUND/IVUS N/A 07/02/2024   Procedure: Coronary Ultrasound/IVUS;  Surgeon: Katina Albright, MD;  Location: [facility] INVASIVE CV LAB;  Service: Cardiovascular;  Laterality: N/A;   ENTEROSCOPY N/A 05/16/2023   Procedure: ENTEROSCOPY;  Surgeon: Therisa Bi, MD;  Location: [facility] ENDOSCOPY;  Service: Gastroenterology;  Laterality: N/A;   ENTEROSCOPY N/A 06/01/2023   Procedure: ENTEROSCOPY;  Surgeon: Unk Corinn Skiff, MD;  Location: [facility] ENDOSCOPY;  Service: Gastroenterology;  Laterality: N/A;   ESOPHAGOGASTRODUODENOSCOPY  05/13/2023   Procedure: ESOPHAGOGASTRODUODENOSCOPY (EGD);  Surgeon: Jinny Carmine, MD;  Location: [facility] ENDOSCOPY;  Service: Endoscopy;;   ESOPHAGOGASTRODUODENOSCOPY (EGD) WITH PROPOFOL  N/A 10/28/2022   Procedure: ESOPHAGOGASTRODUODENOSCOPY (EGD) WITH PROPOFOL ;  Surgeon: Onita Elspeth Sharper, DO;  Location: [facility] ENDOSCOPY;  Service: Gastroenterology;  Laterality: N/A;   GIVENS CAPSULE STUDY  05/13/2023   Procedure: GIVENS CAPSULE STUDY;  Surgeon: Jinny Carmine, MD;  Location: [facility] ENDOSCOPY;  Service: Endoscopy;;   GIVENS CAPSULE STUDY  06/01/2023   Procedure: GIVENS  CAPSULE STUDY;  Surgeon: Unk Corinn Skiff, MD;  Location: [facility] ENDOSCOPY;  Service: Gastroenterology;;   LEFT HEART CATH AND CORS/GRAFTS ANGIOGRAPHY N/A 07/02/2024   Procedure: LEFT HEART CATH AND CORS/GRAFTS ANGIOGRAPHY;  Surgeon: Katina Albright, MD;  Location: [facility] INVASIVE CV LAB;  Service: Cardiovascular;  Laterality: N/A;    Allergies  Allergen Reactions   Wellbutrin  [Bupropion ]     Paranoia     Family History  Problem Relation Age of Onset   Pneumonia Mother     Social History   Tobacco Use   Smoking status: Former    Current packs/day: 0.00    Types: Cigarettes    Quit date: 1993    Years since quitting: 32.5   Smokeless tobacco: Current    Types: Snuff   Tobacco comments:    changed to dip  Vaping Use   Vaping status: Never Used  Substance Use Topics   Alcohol  use: Yes    Alcohol /week: 26.0 standard drinks of alcohol     Types: 26 Cans of beer per week   Drug use: No     Pertinent GI related history and allergies were reviewed with the patient  Review of Systems  Unable to perform ROS: Mental status change  Gastrointestinal:  Positive for diarrhea.     Medications Home Medications No current facility-administered medications on file prior to encounter.   Current Outpatient Medications on File Prior to Encounter  Medication Sig Dispense Refill   albuterol  (VENTOLIN  HFA) 108 (90 Base) MCG/ACT inhaler Inhale 2 puffs into the lungs every 6 (six) hours as needed for wheezing or shortness of breath. 6.7 g 2   ascorbic acid  (VITAMIN C ) 500 MG tablet Take 1 tablet (500 mg total) by mouth daily. 30 tablet 2   aspirin  EC 81 MG tablet Take 1 tablet (81 mg total) by mouth daily. Swallow whole. 30 tablet 5   clopidogrel  (PLAVIX ) 75 MG tablet Take 1 tablet (75 mg total) by mouth daily with breakfast. 30 tablet 11   dapagliflozin  propanediol (FARXIGA ) 10 MG TABS tablet Take 1 tablet (10 mg total) by mouth daily. 30 tablet 11   folic acid  (FOLVITE ) 1 MG tablet Take 1  tablet (1 mg total) by mouth daily. 30 tablet 2   iron  polysaccharides (NIFEREX) 150 MG capsule Take 1 capsule (150 mg total) by mouth daily. 30 capsule 2   isosorbide  mononitrate (IMDUR ) 60 MG 24 hr tablet Take 1 tablet (60 mg total) by mouth daily. 30 tablet 11   losartan  (COZAAR ) 25 MG tablet Take 0.5 tablets (12.5 mg total) by mouth daily. 30 tablet 11   metoprolol  succinate (TOPROL -XL) 25 MG 24 hr tablet Take 0.5 tablets (12.5 mg total) by mouth daily. 30 tablet 11   pantoprazole  (PROTONIX ) 40 MG tablet Take 1 tablet (40 mg total) by mouth daily. 30 tablet 2   pravastatin  (PRAVACHOL ) 20 MG tablet Take 1 tablet (20 mg total) by mouth at bedtime. 30 tablet 11   spironolactone  (ALDACTONE ) 25 MG tablet Take 0.5 tablets (12.5 mg total) by mouth daily. 30 tablet 11   torsemide  (DEMADEX ) 10 MG tablet Take 1 tablet (10 mg total) by mouth daily. 30 tablet 11   Pertinent GI related medications were reviewed with the patient  Inpatient Medications  Current Facility-Administered Medications:    acetaminophen  (TYLENOL ) tablet 650 mg, 650 mg, Oral, Q6H PRN, Niu, Xilin, MD   albuterol  (PROVENTIL ) (2.5 MG/3ML) 0.083% nebulizer solution 3 mL, 3 mL, Nebulization, Q4H PRN, Niu, Xilin, MD, 3 mL at 07/12/24 1659   ascorbic acid  (VITAMIN C ) tablet 500 mg, 500 mg, Oral, Daily, Niu, Xilin, MD, 500 mg at 07/13/24 9045   aspirin  EC tablet 81 mg, 81 mg, Oral, Daily, Josette Ade, MD, 81 mg at 07/13/24 9045   Chlorhexidine  Gluconate Cloth 2 % PADS 6 each, 6 each, Topical, Q0600, Zhang, Dekui, MD, 6 each at 07/13/24 1012   clopidogrel  (PLAVIX ) tablet 75 mg, 75 mg, Oral, Daily, Hudson, Caralyn, PA-C, 75 mg at 07/13/24 9045   dextromethorphan -guaiFENesin  (MUCINEX  DM) 30-600 MG per 12 hr tablet 1 tablet, 1 tablet, Oral, BID PRN, Niu, Xilin, MD, 1 tablet at 07/12/24 2248   folic acid  (FOLVITE ) tablet 1 mg, 1 mg, Oral, Daily, Niu, Xilin, MD, 1 mg at 07/13/24 0954   furosemide  (LASIX ) injection 40 mg, 40 mg,  Intravenous, BID, Hudson, Caralyn, PA-C, 40 mg at 07/13/24 0831   heparin  ADULT infusion 100 units/mL (25000 units/250mL), 1,550 Units/hr, Intravenous, Continuous, Hudson, Caralyn, PA-C, Last Rate: 15.5 mL/hr at 07/13/24 0600, 1,550 Units/hr at 07/13/24 0600   hydrALAZINE  (APRESOLINE ) injection 5 mg, 5 mg, Intravenous, Q2H PRN, Niu, Xilin, MD   hydrOXYzine  (ATARAX ) tablet 25 mg, 25 mg, Oral, Q6H PRN, Ouma, Elizabeth Achieng, NP, 25 mg at 07/13/24 9372   ipratropium-albuterol  (DUONEB) 0.5-2.5 (3) MG/3ML nebulizer solution 3 mL, 3 mL, Nebulization, BID, Zhang, Dekui, MD   iron  polysaccharides (NIFEREX) capsule 150 mg, 150 mg, Oral, Daily, Niu, Xilin, MD, 150 mg at 07/13/24 1012   loperamide  (IMODIUM ) capsule 2-4 mg, 2-4 mg, Oral, PRN, Ouma, Elizabeth Achieng, NP   methylPREDNISolone  sodium succinate (SOLU-MEDROL ) 40 mg/mL injection 40 mg, 40 mg, Intravenous, Q24H, Dgayli, Khabib, MD, 40 mg at 07/13/24 9046   metoprolol  succinate (TOPROL -XL) 24 hr tablet 12.5 mg, 12.5 mg, Oral, Daily, Hudson, Caralyn, PA-C, 12.5 mg at 07/13/24 9046   morphine  (PF) 2 MG/ML injection 2 mg, 2 mg, Intravenous, Q4H PRN, Niu, Xilin, MD   multivitamin with minerals tablet 1 tablet, 1 tablet, Oral, Daily, Niu, Xilin, MD, 1 tablet at 07/13/24 1012   nitroGLYCERIN  (NITROSTAT ) SL tablet 0.4 mg, 0.4 mg, Sublingual, Q5 min PRN, Niu, Xilin, MD   ondansetron  (ZOFRAN ) injection 4 mg, 4 mg, Intravenous, Q8H PRN, Niu, Xilin, MD   oxyCODONE -acetaminophen  (PERCOCET/ROXICET) 5-325 MG per tablet 1 tablet, 1 tablet, Oral, Q4H PRN, Niu, Xilin, MD   pantoprazole  (PROTONIX ) injection 40 mg, 40 mg, Intravenous, Q12H, Niu, Xilin, MD, 40 mg at 07/13/24 0954   PHENObarbital  (LUMINAL) 260 mg in sodium chloride  0.9 % 100 mL IVPB, 260 mg, Intravenous, Once, Isadora Hose, MD, Last Rate: 408 mL/hr at 07/13/24 1121, 260 mg at 07/13/24 1121   PHENObarbital  (LUMINAL) injection 97.5 mg, 97.5 mg, Intravenous, Q8H **FOLLOWED BY** [START ON 07/15/2024]  PHENObarbital  (LUMINAL) injection 65 mg, 65 mg, Intravenous, Q8H **FOLLOWED BY** [START ON 07/17/2024] PHENObarbital  (LUMINAL) injection 32.5 mg, 32.5 mg, Intravenous, Q8H, Zhang, Dekui, MD   piperacillin -tazobactam (ZOSYN ) IVPB 3.375 g, 3.375 g, Intravenous, Q8H, Nada Adriana BIRCH, RPH, Last Rate: 12.5 mL/hr at 07/13/24 0630, 3.375 g at 07/13/24 0630   pravastatin  (PRAVACHOL ) tablet 20 mg, 20 mg, Oral, QHS, Niu, Xilin, MD, 20 mg at 07/12/24 2248   thiamine  (VITAMIN B1) tablet 100 mg, 100 mg, Oral, Daily, 100 mg at 07/13/24 0954 **OR** thiamine  (VITAMIN  B1) injection 100 mg, 100 mg, Intravenous, Daily, Niu, Xilin, MD, 100 mg at 07/12/24 0815  heparin  1,550 Units/hr (07/13/24 0600)   PHENObarbital  260 mg (07/13/24 1121)   piperacillin -tazobactam (ZOSYN )  IV 3.375 g (07/13/24 0630)    acetaminophen , albuterol , dextromethorphan -guaiFENesin , hydrALAZINE , hydrOXYzine , loperamide , morphine  injection, nitroGLYCERIN , ondansetron  (ZOFRAN ) IV, oxyCODONE -acetaminophen    Objective   Vitals:   07/13/24 0700 07/13/24 0718 07/13/24 0800 07/13/24 0900  BP: 110/88  105/66 (!) 145/86  Pulse: (!) 114 (!) 114 (!) 117 (!) 122  Resp: (!) 21 17 (!) 25 (!) 24  Temp:   98.2 F (36.8 C)   TempSrc:      SpO2: 99% 99% 100% 99%  Weight:      Height:         Physical Exam Vitals and nursing note reviewed.  Constitutional:      General: He is in acute distress.     Appearance: He is ill-appearing, toxic-appearing and diaphoretic.  HENT:     Head: Normocephalic and atraumatic.     Nose: Nose normal.     Mouth/Throat:     Mouth: Mucous membranes are moist.     Pharynx: Oropharynx is clear.  Eyes:     General: No scleral icterus.    Extraocular Movements: Extraocular movements intact.  Cardiovascular:     Rate and Rhythm: Regular rhythm. Tachycardia present.  Pulmonary:     Comments: Tachypneic. Increased work of breathing. Abdominal:     General: Bowel sounds are normal. There is no distension.      Palpations: Abdomen is soft.     Tenderness: There is no abdominal tenderness. There is no guarding or rebound.  Musculoskeletal:     Cervical back: Neck supple.  Skin:    General: Skin is warm.     Coloration: Skin is not jaundiced or pale.  Neurological:     Mental Status: He is alert.     Comments: Sometimes answers questions appropriately. Active EtOH withdrawal. Tremulous     Laboratory Data Recent Labs  Lab 07/12/24 1439 07/13/24 0006 07/13/24 0420  WBC 6.7 11.1* 13.0*  HGB 7.9* 7.7* 7.5*  HCT 25.1* 25.2* 24.0*  PLT 271 298 278   Recent Labs  Lab 07/11/24 1706 07/12/24 0215 07/12/24 0900 07/13/24 0420  NA 135 136  --  134*  K 4.3 4.5  --  3.8  CL 98 102  --  99  CO2 23 15*  --  26  BUN 11 13  --  22  CALCIUM  8.7* 8.6*  --  8.5*  PROT 7.3  --  7.3  --   BILITOT 0.9  --  0.9  --   ALKPHOS 43  --  45  --   ALT 16  --  18  --   AST 21  --  62*  --   GLUCOSE 102* 167*  --  183*   Recent Labs  Lab 07/11/24 1706  INR 1.1    No results for input(s): LIPASE in the last 72 hours.      Imaging Studies: DG Chest Port 1 View Result Date: 07/12/2024 CLINICAL DATA:  Shortness of breath. EXAM: PORTABLE CHEST 1 VIEW COMPARISON:  07/11/2024 FINDINGS: The cardio pericardial silhouette is enlarged. There is pulmonary vascular congestion without overt pulmonary edema. Trace bilateral pleural effusions. Old posterior left sixth rib fracture. Telemetry leads overlie the chest. IMPRESSION: Enlargement of the cardiopericardial silhouette with new pulmonary vascular congestion and trace bilateral pleural effusions. Electronically Signed  By: Camellia Candle M.D.   On: 07/12/2024 12:11   CT Angio Chest PE W/Cm &/Or Wo Cm Result Date: 07/11/2024 CLINICAL DATA:  High probability for PE. Chest pain with shortness of breath EXAM: CT ANGIOGRAPHY CHEST WITH CONTRAST TECHNIQUE: Multidetector CT imaging of the chest was performed using the standard protocol during bolus administration  of intravenous contrast. Multiplanar CT image reconstructions and MIPs were obtained to evaluate the vascular anatomy. RADIATION DOSE REDUCTION: This exam was performed according to the departmental dose-optimization program which includes automated exposure control, adjustment of the mA and/or kV according to patient size and/or use of iterative reconstruction technique. CONTRAST:  75mL OMNIPAQUE  IOHEXOL  350 MG/ML SOLN COMPARISON:  CT angiogram chest 05/09/2024 FINDINGS: Cardiovascular: Satisfactory opacification of the pulmonary arteries to the segmental level. No evidence of pulmonary embolism. Patient is status post cardiac surgery. The heart is enlarged. There is no pericardial effusion. There are atherosclerotic calcifications of the aorta and coronary arteries. Aorta is normal in size. Mediastinum/Nodes: There are enlarged right hilar lymph nodes measuring up to 11 mm. Enlarged paratracheal lymph nodes measure up to 10 mm. Visualized thyroid gland and esophagus are within normal limits. Lungs/Pleura: Moderate emphysema again seen. There is stable scarring in the right lung apex. There is new patchy slightly nodular airspace disease in the right upper lobe measuring 2.0 by 1.1 cm. There is right-sided central peribronchial wall thickening. Previously identified airspace disease in the left lower lobe has resolved. There is no pleural effusion or pneumothorax. There is a stable left lower lobe pulmonary nodule image 6/57. Upper Abdomen: No acute abnormality. Musculoskeletal: No chest wall abnormality. No acute or significant osseous findings. Review of the MIP images confirms the above findings. IMPRESSION: 1. No evidence for pulmonary embolism. 2. New patchy slightly nodular airspace disease in the right upper lobe worrisome for pneumonia. Follow-up CT recommended in 4-6 weeks recommended to ensure resolution and to exclude underlying nodule. 3. Right-sided central peribronchial wall thickening worrisome for  bronchitis. 4. Right hilar and mediastinal lymphadenopathy, likely reactive. 5. Stable left lower lobe pulmonary nodule. 6. Cardiomegaly. Aortic Atherosclerosis (ICD10-I70.0) and Emphysema (ICD10-J43.9). Electronically Signed   By: Greig Pique M.D.   On: 07/11/2024 18:54   DG Chest Portable 1 View Result Date: 07/11/2024 CLINICAL DATA:  SOB EXAM: PORTABLE CHEST - 1 VIEW COMPARISON:  June 24, 2024 FINDINGS: Biapical pleural thickening. Sternotomy wires and CABG markers. No focal airspace consolidation, pleural effusion, or pneumothorax. Mild cardiomegaly. Tortuous aorta with aortic atherosclerosis. No acute fracture or destructive lesions. Multilevel thoracic osteophytosis. IMPRESSION: No acute cardiopulmonary abnormality. Electronically Signed   By: Rogelia Myers M.D.   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 Recommend conservative management with PPI IV twice daily  Currently on heparin  gtt for nstemi.  Additionally on aspirin  Also on his Plavix  from recent stent placement (July 2025).  Given the close vicinity of time, would not interrupt Plavix  for endoscopy at this time Ate breakfast today with diet continued by primary team  No current signs of gib- pt asked about melena and hematochezia which he denies. He is currently on iron . Nursing has not appreciated a BM  Monitor H&H.  Transfusion and resuscitation as per primary team Avoid frequent lab draws to prevent lab induced anemia Supportive care and antiemetics as per primary team Maintain two sites IV access Avoid nsaids Monitor for GIB.  CIWA protocol  Management of other medical comorbidities as per primary team  Given no active signs of bleeding and no currently planned interventions- GI to sign off. Call back if needed.  I personally performed the service.  Thank you for allowing us  to participate in this patient's care. Please don't hesitate to call if any questions or concerns arise.   Elspeth Ozell Jungling, DO [facility]  Portions of the record may have been created with voice recognition software. Occasional wrong-word or 'sound-a-like' substitutions may have occurred due to the inherent limitations of voice recognition software.  Read the chart carefully and recognize, using context, where substitutions may have occurred.

## 2024-07-14 DIAGNOSIS — D649 Anemia, unspecified: Secondary | ICD-10-CM

## 2024-07-14 DIAGNOSIS — I350 Nonrheumatic aortic (valve) stenosis: Secondary | ICD-10-CM | POA: Diagnosis not present

## 2024-07-14 DIAGNOSIS — J9621 Acute and chronic respiratory failure with hypoxia: Secondary | ICD-10-CM

## 2024-07-14 DIAGNOSIS — I5031 Acute diastolic (congestive) heart failure: Secondary | ICD-10-CM

## 2024-07-14 DIAGNOSIS — G928 Other toxic encephalopathy: Secondary | ICD-10-CM | POA: Diagnosis not present

## 2024-07-14 DIAGNOSIS — J690 Pneumonitis due to inhalation of food and vomit: Secondary | ICD-10-CM

## 2024-07-14 DIAGNOSIS — R7401 Elevation of levels of liver transaminase levels: Secondary | ICD-10-CM | POA: Diagnosis not present

## 2024-07-14 DIAGNOSIS — R739 Hyperglycemia, unspecified: Secondary | ICD-10-CM

## 2024-07-14 LAB — CBC
HCT: 22.8 % — ABNORMAL LOW (ref 39.0–52.0)
Hemoglobin: 7.2 g/dL — ABNORMAL LOW (ref 13.0–17.0)
MCH: 27.3 pg (ref 26.0–34.0)
MCHC: 31.6 g/dL (ref 30.0–36.0)
MCV: 86.4 fL (ref 80.0–100.0)
Platelets: 232 K/uL (ref 150–400)
RBC: 2.64 MIL/uL — ABNORMAL LOW (ref 4.22–5.81)
RDW: 15.9 % — ABNORMAL HIGH (ref 11.5–15.5)
WBC: 8.8 K/uL (ref 4.0–10.5)
nRBC: 0 % (ref 0.0–0.2)

## 2024-07-14 LAB — COMPREHENSIVE METABOLIC PANEL WITH GFR
ALT: 18 U/L (ref 0–44)
AST: 41 U/L (ref 15–41)
Albumin: 3.2 g/dL — ABNORMAL LOW (ref 3.5–5.0)
Alkaline Phosphatase: 31 U/L — ABNORMAL LOW (ref 38–126)
Anion gap: 13 (ref 5–15)
BUN: 28 mg/dL — ABNORMAL HIGH (ref 8–23)
CO2: 28 mmol/L (ref 22–32)
Calcium: 8.3 mg/dL — ABNORMAL LOW (ref 8.9–10.3)
Chloride: 97 mmol/L — ABNORMAL LOW (ref 98–111)
Creatinine, Ser: 1.13 mg/dL (ref 0.61–1.24)
GFR, Estimated: >60 mL/min (ref >60)
Glucose, Bld: 133 mg/dL — ABNORMAL HIGH (ref 70–99)
Potassium: 4 mmol/L (ref 3.5–5.1)
Sodium: 138 mmol/L (ref 135–145)
Total Bilirubin: 1.3 mg/dL — ABNORMAL HIGH (ref 0.0–1.2)
Total Protein: 6.6 g/dL (ref 6.5–8.1)

## 2024-07-14 LAB — CULTURE, BLOOD (ROUTINE X 2)
Culture  Setup Time: NO GROWTH
Special Requests: ADEQUATE

## 2024-07-14 LAB — GLUCOSE, CAPILLARY
Glucose-Capillary: 113 mg/dL — ABNORMAL HIGH (ref 70–99)
Glucose-Capillary: 129 mg/dL — ABNORMAL HIGH (ref 70–99)
Glucose-Capillary: 125 mg/dL — ABNORMAL HIGH (ref 70–99)

## 2024-07-14 LAB — HEMOGLOBIN AND HEMATOCRIT, BLOOD
HCT: 27.5 % — ABNORMAL LOW (ref 39.0–52.0)
Hemoglobin: 8.7 g/dL — ABNORMAL LOW (ref 13.0–17.0)

## 2024-07-14 LAB — HEPARIN LEVEL (UNFRACTIONATED): Heparin Unfractionated: 0.44 [IU]/mL (ref 0.30–0.70)

## 2024-07-14 LAB — MAGNESIUM: Magnesium: 2.2 mg/dL (ref 1.7–2.4)

## 2024-07-14 LAB — AMMONIA: Ammonia: 21 umol/L (ref 9–35)

## 2024-07-14 LAB — PREPARE RBC (CROSSMATCH)

## 2024-07-14 MED ORDER — SODIUM CHLORIDE 0.9% IV SOLUTION: Freq: Once | INTRAVENOUS | Status: AC

## 2024-07-14 MED ORDER — ENSURE PLUS HIGH PROTEIN PO LIQD: 237.0000 mL | Freq: Two times a day (BID) | ORAL | Status: DC

## 2024-07-14 MED ORDER — DAPAGLIFLOZIN PROPANEDIOL 10 MG PO TABS
10.0000 mg | ORAL_TABLET | Freq: Every day | ORAL | Status: DC
  Administered 2024-07-15: 10 mg via ORAL
  Filled 2024-07-14 (×3): qty 1

## 2024-07-14 MED ORDER — PHENOBARBITAL SODIUM 130 MG/ML IJ SOLN
130.0000 mg | Freq: Once | INTRAMUSCULAR | Status: AC
  Administered 2024-07-14: 130 mg via INTRAVENOUS
  Filled 2024-07-14: qty 1

## 2024-07-14 MED ORDER — PHENOBARBITAL SODIUM 65 MG/ML IJ SOLN: 32.5000 mg | Freq: Three times a day (TID) | INTRAMUSCULAR | Status: DC

## 2024-07-14 MED ORDER — ROSUVASTATIN CALCIUM 10 MG PO TABS
20.0000 mg | ORAL_TABLET | Freq: Every day | ORAL | Status: DC
  Administered 2024-07-15: 20 mg via ORAL
  Filled 2024-07-14: qty 2

## 2024-07-14 MED ORDER — PHENOBARBITAL SODIUM 130 MG/ML IJ SOLN
97.5000 mg | Freq: Three times a day (TID) | INTRAMUSCULAR | Status: AC
  Administered 2024-07-14 – 2024-07-16 (×6): 97.5 mg via INTRAVENOUS
  Filled 2024-07-14 (×6): qty 1

## 2024-07-14 MED ORDER — SODIUM CHLORIDE 0.9 % IV SOLN
2.0000 g | INTRAVENOUS | Status: AC
  Administered 2024-07-14 – 2024-07-16 (×3): 2 g via INTRAVENOUS
  Filled 2024-07-14 (×3): qty 20

## 2024-07-14 MED ORDER — FUROSEMIDE 10 MG/ML IJ SOLN
40.0000 mg | Freq: Every day | INTRAMUSCULAR | Status: DC
  Administered 2024-07-15 – 2024-07-16 (×2): 40 mg via INTRAVENOUS
  Filled 2024-07-14 (×2): qty 4

## 2024-07-14 MED ORDER — LORAZEPAM 2 MG/ML IJ SOLN
1.0000 mg | INTRAMUSCULAR | Status: DC | PRN
  Administered 2024-07-14 – 2024-07-16 (×6): 1 mg via INTRAVENOUS
  Filled 2024-07-14 (×6): qty 1

## 2024-07-14 MED ORDER — PHENOBARBITAL SODIUM 130 MG/ML IJ SOLN: 65.0000 mg | Freq: Three times a day (TID) | INTRAMUSCULAR | Status: DC

## 2024-07-14 NOTE — Plan of Care (Signed)
Gerald Stabs RN

## 2024-07-14 NOTE — TOC Progression Note (Signed)
 Transition of Care [facility]) - Progression Note    Patient Details  Name: [name] MRN: [id number] Date of Birth: 02-Dec-1961  [facility]) CM/SW Contact  K'La JINNY Ruts, LCSW Phone Number: 07/14/2024, 1:53 PM  Clinical Narrative:    Chart reviewed. TOC assessment will be completed at a later time due to the patient being disoriented times 4 and no family listed as an emergency contact. Per chart review the patient has had HH in the past. I spoke with [facility] and [facility] and she confirmed that the patient is active and receives PT/OT/ with [facility]. [facility] reports that [facility] saw the patient on July 23rd.   TOC will follow up with the patient and providers at a later time to complete initial assessment.                     Expected Discharge Plan and Services                                               Social Drivers of Health (SDOH) Interventions SDOH Screenings   Food Insecurity: No Food Insecurity (07/12/2024)  Housing: Low Risk  (07/12/2024)  Transportation Needs: No Transportation Needs (07/12/2024)  Utilities: Not At Risk (07/12/2024)  Social Connections: Socially Isolated (07/12/2024)  Tobacco Use: High Risk (07/11/2024)    Readmission Risk Interventions    05/14/2024    8:44 AM 10/27/2022   11:28 AM  Readmission Risk Prevention Plan  Transportation Screening  Complete  PCP or Specialist Appt within 5-7 Days  Complete  PCP or Specialist Appt within 3-5 Days Complete   Home Care Screening  Complete  Medication Review (RN CM)  Complete  HRI or Home Care Consult Complete   Social Work Consult for Recovery Care Planning/Counseling Complete   Palliative Care Screening Not Applicable   Medication Review Oceanographer) Complete

## 2024-07-14 NOTE — Progress Notes (Addendum)
 [facility] CLINIC CARDIOLOGY PROGRESS NOTE       Patient ID: [name] MRN: [id number] DOB/AGE: Aug 11, 1961 63 y.o.  Admit date: 07/11/2024 Referring Physician Dr. Charlie Sellar Primary Physician Osa Geralds, NP  Primary Cardiologist Tinnie Maiden, NP  Reason for Consultation elevated troponins  HPI: [name] is a 63 y.o. 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 Found to have pneumonia, overnight troponins uptrending. Cardiology was consulted for further evaluation.   Interval history: -Patient seen and examined this AM, resting in bed. Patient sedated due to agitation in setting of alcohol  withdrawal. -Weaned off BiPAP, now in 1L [place].  -Diuresed well yesterday with IV lasix , renal function stable today. UOP 2.3L yesterday. -BP soft, HR stable this AM. -Hgb 7.2 this AM  Review of systems complete and found to be negative unless listed above    Past Medical History:  Diagnosis Date   COPD (chronic obstructive pulmonary disease) (HCC)    CVA (cerebral infarction)    Headache    mild, since stroke 2012   Hypertension    MI (myocardial infarction) (HCC) 05/09/2012   Reading difficulty    pt reports he reads at about a second grade level   Stroke [facility]) 2012   numbness to left hand    Wears dentures    full upper and lower    Past Surgical History:  Procedure Laterality Date   CAROTID ENDARTERECTOMY Left    2012 or 2013   CHOLECYSTECTOMY N/A 07/26/2016   Procedure: LAPAROSCOPIC CHOLECYSTECTOMY;  Surgeon: Charlie FORBES Fell, MD;  Location: [facility] ORS;  Service: General;  Laterality: N/A;   COLONOSCOPY N/A 05/08/2024   Procedure:  COLONOSCOPY;  Surgeon: Jinny Carmine, MD;  Location: [facility] ENDOSCOPY;  Service: Endoscopy;  Laterality: N/A;   COLONOSCOPY WITH PROPOFOL  N/A 02/09/2022   Procedure: COLONOSCOPY WITH PROPOFOL ;  Surgeon: Jinny Carmine, MD;  Location: [facility] ENDOSCOPY;  Service: Endoscopy;  Laterality: N/A;   CORONARY ARTERY BYPASS GRAFT  05/10/2012   3 vessel   CORONARY STENT INTERVENTION N/A 07/02/2024   Procedure: CORONARY STENT INTERVENTION;  Surgeon: Katina Albright, MD;  Location: [facility] INVASIVE CV LAB;  Service: Cardiovascular;  Laterality: N/A;   CORONARY ULTRASOUND/IVUS N/A 07/02/2024   Procedure: Coronary Ultrasound/IVUS;  Surgeon: Katina Albright, MD;  Location: [facility] INVASIVE CV LAB;  Service: Cardiovascular;  Laterality: N/A;   ENTEROSCOPY N/A 05/16/2023   Procedure: ENTEROSCOPY;  Surgeon: Therisa Bi, MD;  Location: [facility] ENDOSCOPY;  Service: Gastroenterology;  Laterality: N/A;   ENTEROSCOPY N/A 06/01/2023   Procedure: ENTEROSCOPY;  Surgeon: Unk Corinn Skiff, MD;  Location: [facility] ENDOSCOPY;  Service: Gastroenterology;  Laterality: N/A;   ESOPHAGOGASTRODUODENOSCOPY  05/13/2023   Procedure: ESOPHAGOGASTRODUODENOSCOPY (EGD);  Surgeon: Jinny Carmine, MD;  Location: [facility] ENDOSCOPY;  Service: Endoscopy;;   ESOPHAGOGASTRODUODENOSCOPY (EGD) WITH PROPOFOL  N/A 10/28/2022   Procedure: ESOPHAGOGASTRODUODENOSCOPY (EGD) WITH PROPOFOL ;  Surgeon: Onita Elspeth Sharper, DO;  Location: [facility] ENDOSCOPY;  Service: Gastroenterology;  Laterality: N/A;   GIVENS CAPSULE STUDY  05/13/2023   Procedure: GIVENS CAPSULE STUDY;  Surgeon: Jinny Carmine, MD;  Location: [facility] ENDOSCOPY;  Service: Endoscopy;;   GIVENS CAPSULE STUDY  06/01/2023   Procedure: GIVENS CAPSULE STUDY;  Surgeon: Unk Corinn Skiff,  MD;  Location: [facility] ENDOSCOPY;  Service: Gastroenterology;;   LEFT HEART CATH AND CORS/GRAFTS ANGIOGRAPHY N/A 07/02/2024   Procedure: LEFT HEART CATH AND CORS/GRAFTS ANGIOGRAPHY;  Surgeon: Katina Albright, MD;  Location: [facility] INVASIVE CV LAB;  Service:  Cardiovascular;  Laterality: N/A;    Medications Prior to Admission  Medication Sig Dispense Refill Last Dose/Taking   albuterol  (VENTOLIN  HFA) 108 (90 Base) MCG/ACT inhaler Inhale 2 puffs into the lungs every 6 (six) hours as needed for wheezing or shortness of breath. 6.7 g 2 07/11/2024 Morning   ascorbic acid  (VITAMIN C ) 500 MG tablet Take 1 tablet (500 mg total) by mouth daily. 30 tablet 2 07/11/2024 Morning   aspirin  EC 81 MG tablet Take 1 tablet (81 mg total) by mouth daily. Swallow whole. 30 tablet 5 07/11/2024 Morning   clopidogrel  (PLAVIX ) 75 MG tablet Take 1 tablet (75 mg total) by mouth daily with breakfast. 30 tablet 11 07/11/2024 Morning   dapagliflozin  propanediol (FARXIGA ) 10 MG TABS tablet Take 1 tablet (10 mg total) by mouth daily. 30 tablet 11 07/11/2024 Morning   folic acid  (FOLVITE ) 1 MG tablet Take 1 tablet (1 mg total) by mouth daily. 30 tablet 2 07/11/2024   iron  polysaccharides (NIFEREX) 150 MG capsule Take 1 capsule (150 mg total) by mouth daily. 30 capsule 2 07/11/2024   isosorbide  mononitrate (IMDUR ) 60 MG 24 hr tablet Take 1 tablet (60 mg total) by mouth daily. 30 tablet 11 07/11/2024   losartan  (COZAAR ) 25 MG tablet Take 0.5 tablets (12.5 mg total) by mouth daily. 30 tablet 11 07/11/2024 Morning   metoprolol  succinate (TOPROL -XL) 25 MG 24 hr tablet Take 0.5 tablets (12.5 mg total) by mouth daily. 30 tablet 11 07/11/2024 Morning   pantoprazole  (PROTONIX ) 40 MG tablet Take 1 tablet (40 mg total) by mouth daily. 30 tablet 2 07/11/2024 Morning   pravastatin  (PRAVACHOL ) 20 MG tablet Take 1 tablet (20 mg total) by mouth at bedtime. 30 tablet 11 07/10/2024 Evening   spironolactone  (ALDACTONE ) 25 MG tablet Take 0.5 tablets (12.5 mg total) by mouth daily. 30 tablet 11 07/11/2024 Morning   torsemide  (DEMADEX ) 10 MG tablet Take 1 tablet (10 mg total) by mouth daily. 30 tablet 11 07/11/2024   Social History   Socioeconomic History   Marital status: Divorced    Spouse name: Not on file    Number of children: Not on file   Years of education: Not on file   Highest education level: Not on file  Occupational History   Not on file  Tobacco Use   Smoking status: Former    Current packs/day: 0.00    Types: Cigarettes    Quit date: 89    Years since quitting: 32.5   Smokeless tobacco: Current    Types: Snuff   Tobacco comments:    changed to dip  Vaping Use   Vaping status: Never Used  Substance and Sexual Activity   Alcohol  use: Yes    Alcohol /week: 26.0 standard drinks of alcohol     Types: 26 Cans of beer per week   Drug use: No   Sexual activity: Yes    Birth control/protection: None  Other Topics Concern   Not on file  Social History Narrative   Not on file   Social Drivers of Health   Financial Resource Strain: Not on file  Food Insecurity: No Food Insecurity (07/12/2024)   Hunger Vital Sign    Worried About Running Out of Food in the Last Year: Never true  Ran Out of Food in the Last Year: Never true  Transportation Needs: No Transportation Needs (07/12/2024)   PRAPARE - Administrator, Civil Service (Medical): No    Lack of Transportation (Non-Medical): No  Physical Activity: Not on file  Stress: Not on file  Social Connections: Socially Isolated (07/12/2024)   Social Connection and Isolation Panel    Frequency of Communication with Friends and Family: Once a week    Frequency of Social Gatherings with Friends and Family: Once a week    Attends Religious Services: Never    Database administrator or Organizations: No    Attends Banker Meetings: Never    Marital Status: Divorced  Catering manager Violence: Not At Risk (07/12/2024)   Humiliation, Afraid, Rape, and Kick questionnaire    Fear of Current or Ex-Partner: No    Emotionally Abused: No    Physically Abused: No    Sexually Abused: No    Family History  Problem Relation Age of Onset   Pneumonia Mother      Vitals:   07/13/24 1930 07/14/24 0000 07/14/24 0013  07/14/24 0500  BP:   (!) 85/60   Pulse: 83 74    Resp: 14 19    Temp: 97.6 F (36.4 C)     TempSrc: Axillary     SpO2:      Weight:    101.1 kg  Height:        PHYSICAL EXAM General: Ill appearing male, well nourished, in no acute distress, sedated HEENT: Normocephalic and atraumatic. Neck: No JVD.  Lungs: Normal respiratory effort on 1L [place]. Diminished breath sounds bilaterally. Heart: HRRR, elevated rate. Normal S1 and S2 without gallops. +systolic ejection murmur.   Abdomen: Non-distended appearing.  Msk: Normal strength and tone for age. Extremities: Warm and well perfused. No clubbing, cyanosis, edema.   Labs: Basic Metabolic Panel: Recent Labs    07/13/24 0420 07/14/24 0535  NA 134* 138  K 3.8 4.0  CL 99 97*  CO2 26 28  GLUCOSE 183* 133*  BUN 22 28*  CREATININE 0.90 1.13  CALCIUM  8.5* 8.3*  MG 2.1 2.2   Liver Function Tests: Recent Labs    07/12/24 0900 07/14/24 0535  AST 62* 41  ALT 18 18  ALKPHOS 45 31*  BILITOT 0.9 1.3*  PROT 7.3 6.6  ALBUMIN 3.5 3.2*   No results for input(s): LIPASE, AMYLASE in the last 72 hours. CBC: Recent Labs    07/11/24 1706 07/11/24 2137 07/13/24 2329 07/14/24 0645  WBC 8.6   < > 10.1 8.8  NEUTROABS 4.9  --   --   --   HGB 8.3*   < > 7.1* 7.2*  HCT 26.7*   < > 23.2* 22.8*  MCV 86.1   < > 85.6 86.4  PLT 339   < > 244 232   < > = values in this interval not displayed.   Cardiac Enzymes: Recent Labs    07/12/24 0532 07/12/24 1537 07/12/24 1740  TROPONINIHS 1,862* 5,766* 5,756*   BNP: Recent Labs    07/11/24 1706 07/12/24 0900  BNP 222.2* 1,552.7*   D-Dimer: No results for input(s): DDIMER in the last 72 hours. Hemoglobin A1C: No results for input(s): HGBA1C in the last 72 hours. Fasting Lipid Panel: No results for input(s): CHOL, HDL, LDLCALC, TRIG, CHOLHDL, LDLDIRECT in the last 72 hours. Thyroid Function Tests: No results for input(s): TSH, T4TOTAL, T3FREE, THYROIDAB in  the last 72 hours.  Invalid input(s): FREET3 Anemia Panel: No results for input(s): VITAMINB12, FOLATE, FERRITIN, TIBC, IRON , RETICCTPCT in the last 72 hours.   Radiology: [facility] Chest Port 1 View Result Date: 07/13/2024 CLINICAL DATA:  Respiratory failure with hypoxia. EXAM: PORTABLE CHEST 1 VIEW COMPARISON:  Multiple previous chest x-rays and recent chest CT. FINDINGS: Stable surgical changes from bypass surgery. The cardiac silhouette, mediastinal and hilar contours are within normal limits and stable. Underlying emphysematous changes and pulmonary scarring. No pleural effusions or discrete pulmonary infiltrates. No pneumothorax. IMPRESSION: Emphysematous changes and pulmonary scarring but no acute overlying pulmonary process. Electronically Signed   By: MYRTIS Stammer M.D.   On: 07/13/2024 16:08   US  Abdomen Limited RUQ (LIVER/GB) Result Date: 07/13/2024 CLINICAL DATA:  Portal hypertension. EXAM: ULTRASOUND ABDOMEN LIMITED RIGHT UPPER QUADRANT COMPARISON:  Abdominal ultrasound 10/19/2022 FINDINGS: Gallbladder: Surgically absent. Common bile duct: Diameter: 3.3 mm Liver: No focal lesion identified. Parenchymal echogenicity is increased and diffusely heterogeneous. There is lobular liver contour. Portal vein is patent on color Doppler imaging with normal direction of blood flow towards the liver. Other: None. IMPRESSION: Lobular liver contour with increased echogenicity and heterogeneous parenchyma consistent with cirrhosis. No focal liver lesion identified. Electronically Signed   By: Greig Pique M.D.   On: 07/13/2024 16:03   DG Chest Port 1 View Result Date: 07/12/2024 CLINICAL DATA:  Shortness of breath. EXAM: PORTABLE CHEST 1 VIEW COMPARISON:  07/11/2024 FINDINGS: The cardio pericardial silhouette is enlarged. There is pulmonary vascular congestion without overt pulmonary edema. Trace bilateral pleural effusions. Old posterior left sixth rib fracture. Telemetry leads overlie the chest.  IMPRESSION: Enlargement of the cardiopericardial silhouette with new pulmonary vascular congestion and trace bilateral pleural effusions. Electronically Signed   By: Camellia Candle M.D.   On: 07/12/2024 12:11   CT Angio Chest PE W/Cm &/Or Wo Cm Result Date: 07/11/2024 CLINICAL DATA:  High probability for PE. Chest pain with shortness of breath EXAM: CT ANGIOGRAPHY CHEST WITH CONTRAST TECHNIQUE: Multidetector CT imaging of the chest was performed using the standard protocol during bolus administration of intravenous contrast. Multiplanar CT image reconstructions and MIPs were obtained to evaluate the vascular anatomy. RADIATION DOSE REDUCTION: This exam was performed according to the departmental dose-optimization program which includes automated exposure control, adjustment of the mA and/or kV according to patient size and/or use of iterative reconstruction technique. CONTRAST:  75mL OMNIPAQUE  IOHEXOL  350 MG/ML SOLN COMPARISON:  CT angiogram chest 05/09/2024 FINDINGS: Cardiovascular: Satisfactory opacification of the pulmonary arteries to the segmental level. No evidence of pulmonary embolism. Patient is status post cardiac surgery. The heart is enlarged. There is no pericardial effusion. There are atherosclerotic calcifications of the aorta and coronary arteries. Aorta is normal in size. Mediastinum/Nodes: There are enlarged right hilar lymph nodes measuring up to 11 mm. Enlarged paratracheal lymph nodes measure up to 10 mm. Visualized thyroid gland and esophagus are within normal limits. Lungs/Pleura: Moderate emphysema again seen. There is stable scarring in the right lung apex. There is new patchy slightly nodular airspace disease in the right upper lobe measuring 2.0 by 1.1 cm. There is right-sided central peribronchial wall thickening. Previously identified airspace disease in the left lower lobe has resolved. There is no pleural effusion or pneumothorax. There is a stable left lower lobe pulmonary nodule  image 6/57. Upper Abdomen: No acute abnormality. Musculoskeletal: No chest wall abnormality. No acute or significant osseous findings. Review of the MIP images confirms the above findings. IMPRESSION: 1. No evidence for pulmonary embolism. 2. New patchy slightly  nodular airspace disease in the right upper lobe worrisome for pneumonia. Follow-up CT recommended in 4-6 weeks recommended to ensure resolution and to exclude underlying nodule. 3. Right-sided central peribronchial wall thickening worrisome for bronchitis. 4. Right hilar and mediastinal lymphadenopathy, likely reactive. 5. Stable left lower lobe pulmonary nodule. 6. Cardiomegaly. Aortic Atherosclerosis (ICD10-I70.0) and Emphysema (ICD10-J43.9). Electronically Signed   By: Greig Pique M.D.   On: 07/11/2024 18:54   DG Chest Portable 1 View Result Date: 07/11/2024 CLINICAL DATA:  SOB EXAM: PORTABLE CHEST - 1 VIEW COMPARISON:  June 24, 2024 FINDINGS: Biapical pleural thickening. Sternotomy wires and CABG markers. No focal airspace consolidation, pleural effusion, or pneumothorax. Mild cardiomegaly. Tortuous aorta with aortic atherosclerosis. No acute fracture or destructive lesions. Multilevel thoracic osteophytosis. IMPRESSION: No acute cardiopulmonary abnormality. Electronically Signed   By: Rogelia Myers M.D.   On: 07/11/2024 17:25   CARDIAC CATHETERIZATION Result Date: 07/02/2024   Prox LAD to Mid LAD lesion is 90% stenosed.   Ost LM to Mid LM lesion is 50% stenosed.   Ost Cx to Prox Cx lesion is 90% stenosed.   Ost RCA to Prox RCA lesion is 100% stenosed.   Mid Graft to Dist Graft lesion is 95% stenosed.   Recommend uninterrupted dual antiplatelet therapy with Aspirin  81mg  daily and Clopidogrel  75mg  daily for a minimum of 6 months (stable ischemic heart disease-Class I recommendation). 1.  Severe native three-vessel CAD 2.  LIMA to LAD and SVG to OM widely patent 3.  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  Successful direct  stenting of the distal SVG to PDA with distal protection with a 4.0 x 15 mm drug-eluting stent with intravascular ultrasound guidance 5.  Aspirin  and clopidogrel  for at least 6 months followed by clopidogrel  indefinitely 6.  Continue workup for aortic valve replacement   DG Chest 2 View Result Date: 06/24/2024 CLINICAL DATA:  sob EXAM: CHEST - 2 VIEW COMPARISON:  June 03, 2024 FINDINGS: Biapical pleural thickening. No focal airspace consolidation, pleural effusion, or pneumothorax. No cardiomegaly. Sternotomy wires and CABG markers. Tortuous aorta with aortic atherosclerosis. No acute fracture or destructive lesions. Multilevel thoracic osteophytosis. Osteopenia. IMPRESSION: No acute cardiopulmonary abnormality. Electronically Signed   By: Rogelia Myers M.D.   On: 06/24/2024 11:54    ECHO 04/2024: 1. Left ventricular ejection fraction, by estimation, is 60 to 65%. The left ventricle has normal function. The left ventricle has no regional wall motion abnormalities. The left ventricular internal cavity size was mildly dilated. Left ventricular diastolic parameters are consistent with Grade I diastolic dysfunction (impaired relaxation).   2. Right ventricular systolic function is normal. The right ventricular  size is normal.   3. The mitral valve is normal in structure. No evidence of mitral valve  regurgitation.   4. The aortic valve is normal in structure. Aortic valve regurgitation is  trivial. Severe aortic valve stenosis.   TELEMETRY reviewed by me 07/14/2024: sinus rhythm, rate 60s  EKG reviewed by me: sinus tachycardia ST depression, rate 129 bpm  Data reviewed by me 07/14/2024: last 24h vitals tele labs imaging I/O hospitalist progress note, PCCM notes  Principal Problem:   Acute respiratory distress Active Problems:   Hyperlipidemia   Cognitive impairment   Alcohol  abuse   Essential hypertension   CAD (coronary artery disease)   Stroke (HCC)   Iron  deficiency anemia   Acute on  chronic diastolic CHF (congestive heart failure) (HCC)   COPD exacerbation (HCC)   Elevated lactic  acid level   Lobar pneumonia (HCC)   Metabolic acidosis, increased anion gap   Myocardial injury    ASSESSMENT AND PLAN:  [name] is a 63 y.o. 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 Found to have pneumonia, overnight troponins uptrending. Cardiology was consulted for further evaluation.   # Acute hypoxic respiratory failure # HCAP # COPD exacerbation # Acute on chronic HFpEF (flash pulmonary edema) # Severe aortic stenosis Presented with SOB, cough, imaging concerning for HCAP/COPD exacerbation. LA elevated on presentation at 3.6 > 5.9> 3.4. BNP initially 222 >1500. With worsening respiratory status 7/26, started on BiPAP and transferred to the ICU, weaned to 1L [place]. Patient fluid status improved. -Decreased IV lasix  40 mg to daily. Avoid aggressive diuresis in setting of severe AS.   -Resume home dapagliflozin  10 mg daily.  -Further management of COPD exacerbation/PNA as per primary team.  -CIWA protocol given hx of alcohol  abuse. -Scheduled to see Dr. Katina 7/30 to discuss aortic valve repair/replacement options. Goal to optimize him from volume and symptom standpoint so he can be discharged prior to this appointment. If patient not stable for discharge prior to outpatient appointment, Dr. Katina can see patient as an inpatient so there is no further delay for TAVR evaluation.  # NSTEMI # Coronary artery disease s/p CABG x3 (04/2012) # Hypertension # Hyperlipidemia With recent LHC, s/p DES to SVG to PDA 07/02/2024. Reported some CP when he first came  to hospital which has now resolved, no recurrence.  Troponin trended 44 > 144 > 205 > 980 > 1862 > 5766 > 5756. Cr stable. -Continue home plavix  75 mg daily. Continue aspirin  81 mg daily. DAPT given recent DES. -Started on heparin  gtt by [facility] 7/26, Low Hgb. Completed 24 hours of heparin .  -Ordered Crestor  20 mg daily for high-intensity statin management.  -Continue home metoprolol  succinate 12.5 mg daily. -Home losartan , Imdur  held due to soft BP. -Recommend MAP > 65.   # Acute on chronic anemia # Hx AVM -Continue PPIs, GI consulted and following. -Goal Hgb >8 due to cardiac history. Recommend transfusion. 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  Did the patient have an acute coronary syndrome (MI, NSTEMI, STEMI, etc) this admission?:  Yes                               AHA/ACC ACS Clinical Performance & Quality Measures: Aspirin  prescribed? - Yes ADP Receptor Inhibitor (Plavix /Clopidogrel , Brilinta/Ticagrelor or Effient/Prasugrel) prescribed (includes medically managed patients)? - Yes Beta Blocker prescribed? - Yes High Intensity Statin (Lipitor 40-80mg  or Crestor  20-40mg ) prescribed? - Yes EF assessed during THIS hospitalization? - No - patient with recent echo performed on 05/10/2024.  For EF <40%, was ACEI/ARB prescribed? - Not Applicable (EF >/= 40%) For EF <40%, Aldosterone Antagonist (Spironolactone  or Eplerenone) prescribed? - Not Applicable (EF >/= 40%) Cardiac Rehab Phase II ordered (including medically managed patients)? - Yes, Patient with recent outpatient LHC performed on 07/16, referred to cardiac rehab during that time on 07/04/24.  This patient's plan of care was discussed and created with Dr. Wilburn and he is in agreement.  Signed: Dorene Comfort, PA-C  07/14/2024, 7:22 AM [facility] Cardiology

## 2024-07-14 NOTE — Progress Notes (Signed)
 PHARMACY - ANTICOAGULATION CONSULT NOTE  Pharmacy Consult for heparin  infusion Indication: chest pain/ACS  Allergies  Allergen Reactions   Wellbutrin  [Bupropion ]     Paranoia     Patient Measurements: Height: 6' (182.9 cm) Weight: 97.8 kg (215 lb 9.8 oz) IBW/kg (Calculated) : 77.6 HEPARIN  DW (KG): 95  Vital Signs: Temp: 97.6 F (36.4 C) (07/27 1930) Temp Source: Axillary (07/27 1930) BP: 85/60 (07/28 0013) Pulse Rate: 74 (07/28 0000)  Labs: Recent Labs    07/11/24 1706 07/11/24 1951 07/12/24 0215 07/12/24 0532 07/12/24 0900 07/12/24 1537 07/12/24 1740 07/13/24 0006 07/13/24 0420 07/13/24 1138 07/13/24 1449 07/13/24 1705 07/13/24 2329 07/14/24 0535  HGB 8.3*   < > 8.0*  --    < >  --   --    < > 7.5* 7.9* 8.3* 8.5* 7.1*  --   HCT 26.7*   < > 26.0*  --    < >  --   --    < > 24.0*  --  26.0*  --  23.2*  --   PLT 339   < > 293  --    < >  --   --    < > 278  --  346  --  244  --   APTT 34  --   --   --   --   --   --   --   --   --   --   --   --   --   LABPROT 15.3*  --   --   --   --   --   --   --   --   --   --   --   --   --   INR 1.1  --   --   --   --   --   --   --   --   --   --   --   --   --   HEPARINUNFRC  --   --   --   --   --   --   --    < > 0.42 0.41  --   --   --  0.44  CREATININE 0.91  --  0.94  --   --   --   --   --  0.90  --   --   --   --   --   TROPONINIHS 44*   < > 980* 1,862*  --  5,766* 5,756*  --   --   --   --   --   --   --    < > = values in this interval not displayed.    Estimated Creatinine Clearance: 99.2 mL/min (by C-G formula based on SCr of 0.9 mg/dL).   Medical History: Past Medical History:  Diagnosis Date   COPD (chronic obstructive pulmonary disease) (HCC)    CVA (cerebral infarction)    Headache    mild, since stroke 2012   Hypertension    MI (myocardial infarction) (HCC) 05/09/2012   Reading difficulty    pt reports he reads at about a second grade level   Stroke [facility]) 2012   numbness to left hand    Wears  dentures    full upper and lower    Medications:  Scheduled:   ascorbic acid   500 mg Oral Daily   aspirin  EC  81 mg Oral Daily   Chlorhexidine  Gluconate  Cloth  6 each Topical Q0600   clopidogrel   75 mg Oral Daily   folic acid   1 mg Oral Daily   furosemide   40 mg Intravenous BID   ipratropium-albuterol   3 mL Nebulization BID   iron  polysaccharides  150 mg Oral Daily   methylPREDNISolone  (SOLU-MEDROL ) injection  40 mg Intravenous Q24H   metoprolol  succinate  12.5 mg Oral Daily   multivitamin with minerals  1 tablet Oral Daily   pantoprazole  (PROTONIX ) IV  40 mg Intravenous Q12H   pravastatin   20 mg Oral QHS   thiamine   100 mg Oral Daily   Or   thiamine   100 mg Intravenous Daily   Infusions:   dexmedetomidine  (PRECEDEX ) IV infusion 0.5 mcg/kg/hr (07/13/24 2050)   PHENObarbital      piperacillin -tazobactam (ZOSYN )  IV 3.375 g (07/13/24 2114)    Assessment: 63 y.o. male with medical history significant for  CAD S/P CABG, chronic HFpEF, severe aortic stenosis, HTN, HLD, obesity, COPD, recent GI bleed, alcohol  use disorder presented to [facility] for SOB, cough, chest pain. A review of medical records reveals no chronic anticoagulation prior to arrival  Baseline Labs: Hgb 7.9, PLT wnl, INR 1.1, aPTT 34s  Goal of Therapy:  anti-Xa level  0.3-0.7 units/ml Monitor platelets by anticoagulation protocol: Yes   Plan: anti-Xa level therapeutic x 3 ---Heparin  infusion scheduled to stop at 0600. ---Pharmacy to F/U regarding anticoag plan if warranted.  Rankin CANDIE Dills, PharmD, [facility] 07/14/2024 6:25 AM

## 2024-07-14 NOTE — Discharge Instructions (Signed)
 Do you feel isolated?  [facility] offers a [employer] that anyone can call toll free at [phone]. The friendship line is available 24 hours a day  [employer] is a Program of All-inclusive Care for the Elderly (PACE). Their mission is to promote and sustain the independence of seniors wishing to remain in the community. They provide seniors with comprehensive long-term health, social, medical and dietary care. Their program is a safe alternative to nursing home care. [phone]  [facility] Physical Address [facility] [address] [place], [place] [address] Phone: [phone]. . Online zoom yoga class, connect with others without leaving your home [facility] offers [place] dance cardio sessions for individuals via Zoom. This program provides: - Dance fitness activities Please contact program for more information. Servinganyone in need adults 18+ hiv/aids individuals families Call [phone]  Email [email or web address] to get more info  [employer] offers an online [employer] to individuals where they can receive help to focus on their best health. Whether you're a [employer] member or not, the neighborhood center offers a... Main Serviceshealth education  exercise & fitness  community support services  recreation  virtual support Other Servicessupport groups Servinganyone in need adults young adults teens seniors individuals families [email or web address] to get more info  Schedule on their website  The [facility] offers an array of activities for adults age 13 and over. This program provides:- Fitness and health programs- Tech classes- Activity books Main Serviceshealth education  community support services  exercise & fitness  recreation  more education Servingseniors  Call [phone]    For more resources go online to [email or web address] and type in you zipcode

## 2024-07-14 NOTE — Progress Notes (Signed)
 NAME:  [name], MRN:  [id number], DOB:  [birth year], LOS: 3 ADMISSION DATE:  07/11/2024, CONSULTATION DATE: 07/12/24  History of Present Illness:  Patient is a 63 year old male who was admitted to the hospital on 7/25 after presenting with shortness of breath and chest discomfort.   He has a known history of severe aortic stenosis as well as coronary artery disease.  He is previously had CABG as well as PCI.  He also has underlying COPD, HFpEF, alcohol  use disorder, and GI bleed.   Patient was recently in the hospital on 7/16 - 7/17 for left heart cath with stenting to bypass graft (SVG to PDA).  He developed shortness of breath with cough productive of sputum over the past few days prompting presentation to the ED yesterday.   On arrival to the ED, his lower extremities were swollen and he was found to have a wheeze.  He was hypoxic requiring oxygen support via nasal cannula.  Given respiratory distress, he was started on treatment for COPD exacerbation and admitted to the hospitalist service for further management.  CT scan of the chest was negative for PE but did note some patchy nodular airspace disease as well as airway thickening.  He was started on broad-spectrum IV antibiotics, steroids, bronchodilators, and received IV fluids for sepsis given elevated lactic acid.   This morning, he had increased shortness of breath with respiratory distress prompting initiation of BiPAP and transferred to the ICU.  He was also started on dexmedetomidine  for agitation.   Blood work showed normal kidney function, but with rising troponin and a significantly elevated BNP. Hemoglobin has been stable.   Pertinent  Medical History  CAD s/p CABG HFpEF Severe Aortic Stenosis ETOH use COPD on Trelegy  Micro Data:  07/25: COVID>>negative  07/25: Influenza A&B>>negative  07/25: RSV>>negative  07/25: MRSA PCR>>negative  07/25: Blood>>staph in aerobic bottle only likely a contaminant  07/26:  RVP>>negative   Anti-infectives (From admission, onward)    Start     Dose/Rate Route Frequency Ordered Stop   07/14/24 1200  cefTRIAXone  (ROCEPHIN ) 2 g in sodium chloride  0.9 % 100 mL IVPB        2 g 200 mL/hr over 30 Minutes Intravenous Every 24 hours 07/14/24 1030 07/17/24 1159   07/12/24 1600  piperacillin -tazobactam (ZOSYN ) IVPB 3.375 g  Status:  Discontinued        3.375 g 12.5 mL/hr over 240 Minutes Intravenous Every 8 hours 07/12/24 1503 07/14/24 1030   07/12/24 0900  vancomycin  (VANCOREADY) IVPB 1250 mg/250 mL  Status:  Discontinued        1,250 mg 166.7 mL/hr over 90 Minutes Intravenous Every 12 hours 07/11/24 2012 07/12/24 1458   07/11/24 2100  ceFEPIme  (MAXIPIME ) 2 g in sodium chloride  0.9 % 100 mL IVPB  Status:  Discontinued        2 g 200 mL/hr over 30 Minutes Intravenous Every 8 hours 07/11/24 1959 07/12/24 1458   07/11/24 2100  vancomycin  (VANCOREADY) IVPB 2000 mg/400 mL        2,000 mg 200 mL/hr over 120 Minutes Intravenous  Once 07/11/24 2004 07/11/24 2255   07/11/24 1945  cefTRIAXone  (ROCEPHIN ) 1 g in sodium chloride  0.9 % 100 mL IVPB  Status:  Discontinued        1 g 200 mL/hr over 30 Minutes Intravenous  Once 07/11/24 1930 07/11/24 1933   07/11/24 1945  azithromycin  (ZITHROMAX ) 500 mg in sodium chloride  0.9 % 250 mL IVPB  Status:  Discontinued  500 mg 250 mL/hr over 60 Minutes Intravenous  Once 07/11/24 1930 07/11/24 1955   07/11/24 1945  cefTRIAXone  (ROCEPHIN ) 2 g in sodium chloride  0.9 % 100 mL IVPB  Status:  Discontinued        2 g 200 mL/hr over 30 Minutes Intravenous  Once 07/11/24 1933 07/11/24 1955      Significant Hospital Events: Including procedures, antibiotic start and stop dates in addition to other pertinent events   7/25: admit with respiratory failure 7/26: clinically decompensated, worsening respiratory status initiated on BiPAP 7/27: on nasal cannula, reports anxiety and tremors consistent with EtOH withdrawal.  Received a total of 15  mg/kg of phenobarbital , however he remained agitated requiring low dose precedex  gtt.  7/28: Pt resting in bed no agitation on precedex  gtt @0 .5 mcg/min.  Orders placed to transfuse 1 unit of pRBC's hgb 7.2 goal hgb 8.0.  No signs of active bleeding   Interim History / Subjective:  As outlined above under significant events   Objective    Blood pressure 92/68, pulse 72, temperature (!) 97.5 F (36.4 C), temperature source Axillary, resp. rate 11, height 6' (1.829 m), weight 101.1 kg, SpO2 100%.        Intake/Output Summary (Last 24 hours) at 07/14/2024 0942 Last data filed at 07/14/2024 0700 Gross per 24 hour  Intake 1083.45 ml  Output 1850 ml  Net -766.55 ml   Filed Weights   07/12/24 0931 07/13/24 0500 07/14/24 0500  Weight: 100.1 kg 97.8 kg 101.1 kg   Examination: General: Acutely ill appearing male, NAD on 1L O2 via nasal canula  HENT: Supple, no JVD  Lungs: Diminished throughout, even, non labored  Cardiovascular: NSR, s1s2, no m/r/g, 2+ radial/1+ distal pulses, no edema Abdomen: +BS x4, obese, soft, non distended  Extremities: Normal bulk and tone Neuro: Sedated, not follow commands, withdraws from pain stimulation, PERRL  GU: External catheter   Resolved problem list   Assessment and Plan  #Acute toxic metabolic encephalopathy  #ETOH withdrawal  - Correct metabolic derangements  - Prn hydroxyzine  and precedex  gtt per CIWA protocol  - Continue thiamine , mvi, and folic acid   - Maintain sleep/wake cycle - Avoid sedating medications as able  - Once mentation improves will need ETOH cessation counseling   #Severe aortic stenosis  #Elevated troponin's secondary to NSTEMI vs. demand ischemia  #Acute HFpEF  Hx: HTN, MI, CAD, and stroke  - Continuous telemetry monitoring  - Troponin peaked at 5,766 - Cardiology consulted appreciate input: pt to follow-up outpatient to discuss aortic valve repair/replacement options.  Goal is to optimize him regarding volume and  symptom standpoint prior to discharge  - Continue aspirin , plavix , metoprolol , and rosuvastatin   - Completed 24hrs of heparin  gtt  - Continue iv lasix  40 mg daily  - Prn hydralazine  for bp control   #Acute on chronic hypoxic respiratory failure  #AECOPD #Aspiration pneumonia - Supplemental O2 for dysnea and/or hypoxia  - Maintain O2 sats 88% to 92% - Scheduled and prn bronchodilator therapy  - IV steroids: wean as tolerated  - Aggressive pulmonary hygiene   #Aspiration pneumonia  - Trend WBC and monitor fever curve  - Follow cultures  - Will discontinue zosyn  and start ceftriaxone    #Anemia without signs of bleeding  #Possible GI bleed  Hx: AVM's undergone multiple bidrectional endoscopies without findings of bleeding within the last 2 yrs  - Trend CBC  - Monitor for s/sx of bleeding  - Transfuse for hgb <8  - Continue scheduled niferex  -  GI consulted appreciate input: due to no signs of active bleeding no indications for interventions at this time   #Hyperglycemia likely steroid induced  - CBG's q4hrs  - Follow hypo/hyperglycemic protocol  - Target CBG readings 140 to 180   Best Practice (right click and Reselect all SmartList Selections daily)   Diet/type: NPO DVT prophylaxis SCD Pressure ulcer(s): N/A GI prophylaxis: PPI Lines: N/A Foley:  N/A Code Status:  full code Last date of multidisciplinary goals of care discussion [07/14/24]  Attempted to contact pts emergency contact in Epic Mliss Candy, however number listed is not accurate  Labs   CBC: Recent Labs  Lab 07/11/24 1706 07/11/24 2137 07/13/24 0006 07/13/24 0420 07/13/24 1138 07/13/24 1449 07/13/24 1705 07/13/24 2329 07/14/24 0645  WBC 8.6   < > 11.1* 13.0*  --  22.6*  --  10.1 8.8  NEUTROABS 4.9  --   --   --   --   --   --   --   --   HGB 8.3*   < > 7.7* 7.5* 7.9* 8.3* 8.5* 7.1* 7.2*  HCT 26.7*   < > 25.2* 24.0*  --  26.0*  --  23.2* 22.8*  MCV 86.1   < > 85.7 85.1  --  85.5  --  85.6 86.4   PLT 339   < > 298 278  --  346  --  244 232   < > = values in this interval not displayed.    Basic Metabolic Panel: Recent Labs  Lab 07/11/24 1706 07/12/24 0215 07/12/24 0900 07/13/24 0420 07/14/24 0535  NA 135 136  --  134* 138  K 4.3 4.5  --  3.8 4.0  CL 98 102  --  99 97*  CO2 23 15*  --  26 28  GLUCOSE 102* 167*  --  183* 133*  BUN 11 13  --  22 28*  CREATININE 0.91 0.94  --  0.90 1.13  CALCIUM  8.7* 8.6*  --  8.5* 8.3*  MG  --   --  1.8 2.1 2.2   GFR: Estimated Creatinine Clearance: 80.2 mL/min (by C-G formula based on SCr of 1.13 mg/dL). Recent Labs  Lab 07/11/24 1951 07/11/24 2137 07/12/24 0215 07/12/24 0900 07/12/24 1439 07/13/24 0420 07/13/24 1449 07/13/24 2329 07/14/24 0645  WBC  --  6.2   < > 8.2   < > 13.0* 22.6* 10.1 8.8  LATICACIDVEN 3.6* 5.9*  --  3.4*  --   --   --   --   --    < > = values in this interval not displayed.    Liver Function Tests: Recent Labs  Lab 07/11/24 1706 07/12/24 0900 07/14/24 0535  AST 21 62* 41  ALT 16 18 18   ALKPHOS 43 45 31*  BILITOT 0.9 0.9 1.3*  PROT 7.3 7.3 6.6  ALBUMIN 3.5 3.5 3.2*   No results for input(s): LIPASE, AMYLASE in the last 168 hours. Recent Labs  Lab 07/14/24 0535  AMMONIA 21    ABG    Component Value Date/Time   PHART 7.36 07/12/2024 0814   PCO2ART 38 07/12/2024 0814   PO2ART 364 (H) 07/12/2024 0814   HCO3 21.5 07/12/2024 0814   ACIDBASEDEF 3.6 (H) 07/12/2024 0814   O2SAT 100 07/12/2024 0814     Coagulation Profile: Recent Labs  Lab 07/11/24 1706  INR 1.1    Cardiac Enzymes: No results for input(s): CKTOTAL, CKMB, CKMBINDEX, TROPONINI in the last 168 hours.  HbA1C:  Hemoglobin A1C  Date/Time Value Ref Range Status  12/02/2013 03:52 PM 5.3 4.2 - 6.3 % Final    Comment:    The American Diabetes Association recommends that a primary goal of therapy should be <7% and that physicians should reevaluate the treatment regimen in patients with HbA1c values  consistently >8%.    Hgb A1c MFr Bld  Date/Time Value Ref Range Status  10/28/2022 04:37 AM 5.3 4.8 - 5.6 % Final    Comment:    (NOTE) Pre diabetes:          5.7%-6.4%  Diabetes:              >6.4%  Glycemic control for   <7.0% adults with diabetes     CBG: Recent Labs  Lab 07/12/24 0933  GLUCAP 169*    Review of Systems:   Unable to assess pt sedated   Past Medical History:  He,  has a past medical history of COPD (chronic obstructive pulmonary disease) (HCC), CVA (cerebral infarction), Headache, Hypertension, MI (myocardial infarction) (HCC) (05/09/2012), Reading difficulty, Stroke (HCC) (2012), and Wears dentures.   Surgical History:   Past Surgical History:  Procedure Laterality Date   CAROTID ENDARTERECTOMY Left    2012 or 2013   CHOLECYSTECTOMY N/A 07/26/2016   Procedure: LAPAROSCOPIC CHOLECYSTECTOMY;  Surgeon: Charlie FORBES Fell, MD;  Location: [facility] ORS;  Service: General;  Laterality: N/A;   COLONOSCOPY N/A 05/08/2024   Procedure: COLONOSCOPY;  Surgeon: Jinny Carmine, MD;  Location: [facility] ENDOSCOPY;  Service: Endoscopy;  Laterality: N/A;   COLONOSCOPY WITH PROPOFOL  N/A 02/09/2022   Procedure: COLONOSCOPY WITH PROPOFOL ;  Surgeon: Jinny Carmine, MD;  Location[facility] ENDOSCOPY;  Service: Endoscopy;  Laterality: N/A;   CORONARY ARTERY BYPASS GRAFT  05/10/2012   3 vessel   CORONARY STENT INTERVENTION N/A 07/02/2024   Procedure: CORONARY STENT INTERVENTION;  Surgeon: Katina Albright, MD;  Location: [facility] INVASIVE CV LAB;  Service: Cardiovascular;  Laterality: N/A;   CORONARY ULTRASOUND/IVUS N/A 07/02/2024   Procedure: Coronary Ultrasound/IVUS;  Surgeon: Katina Albright, MD;  Location: [facility] INVASIVE CV LAB;  Service: Cardiovascular;  Laterality: N/A;   ENTEROSCOPY N/A 05/16/2023   Procedure: ENTEROSCOPY;  Surgeon: Therisa Bi, MD;  Location: [facility] ENDOSCOPY;  Service: Gastroenterology;  Laterality: N/A;   ENTEROSCOPY N/A 06/01/2023   Procedure: ENTEROSCOPY;  Surgeon: Unk Corinn Skiff,  MD;  Location: [facility] ENDOSCOPY;  Service: Gastroenterology;  Laterality: N/A;   ESOPHAGOGASTRODUODENOSCOPY  05/13/2023   Procedure: ESOPHAGOGASTRODUODENOSCOPY (EGD);  Surgeon: Jinny Carmine, MD;  Location: [facility] ENDOSCOPY;  Service: Endoscopy;;   ESOPHAGOGASTRODUODENOSCOPY (EGD) WITH PROPOFOL  N/A 10/28/2022   Procedure: ESOPHAGOGASTRODUODENOSCOPY (EGD) WITH PROPOFOL ;  Surgeon: Onita Elspeth Sharper, DO;  Location: [facility] ENDOSCOPY;  Service: Gastroenterology;  Laterality: N/A;   GIVENS CAPSULE STUDY  05/13/2023   Procedure: GIVENS CAPSULE STUDY;  Surgeon: Jinny Carmine, MD;  Location: [facility] ENDOSCOPY;  Service: Endoscopy;;   GIVENS CAPSULE STUDY  06/01/2023   Procedure: GIVENS CAPSULE STUDY;  Surgeon: Unk Corinn Skiff, MD;  Location: [facility] ENDOSCOPY;  Service: Gastroenterology;;   LEFT HEART CATH AND CORS/GRAFTS ANGIOGRAPHY N/A 07/02/2024   Procedure: LEFT HEART CATH AND CORS/GRAFTS ANGIOGRAPHY;  Surgeon: Katina Albright, MD;  Location: [facility] INVASIVE CV LAB;  Service: Cardiovascular;  Laterality: N/A;     Social History:   reports that he quit smoking about 32 years ago. His smoking use included cigarettes. His smokeless tobacco use includes snuff. He reports current alcohol  use of about 26.0 standard drinks of alcohol  per week. He reports that he does not use  drugs.   Family History:  His family history includes Pneumonia in his mother.   Allergies Allergies  Allergen Reactions   Wellbutrin  [Bupropion ]     Paranoia      Home Medications  Prior to Admission medications   Medication Sig Start Date End Date Taking? Authorizing Provider  albuterol  (VENTOLIN  HFA) 108 (90 Base) MCG/ACT inhaler Inhale 2 puffs into the lungs every 6 (six) hours as needed for wheezing or shortness of breath. 07/03/24  Yes Von Bellis, MD  ascorbic acid  (VITAMIN C ) 500 MG tablet Take 1 tablet (500 mg total) by mouth daily. 07/03/24 10/01/24 Yes Von Bellis, MD  aspirin  EC 81 MG tablet Take 1 tablet (81 mg total) by mouth  daily. Swallow whole. 07/03/24 12/30/24 Yes Von Bellis, MD  clopidogrel  (PLAVIX ) 75 MG tablet Take 1 tablet (75 mg total) by mouth daily with breakfast. 07/03/24  Yes Von Bellis, MD  dapagliflozin  propanediol (FARXIGA ) 10 MG TABS tablet Take 1 tablet (10 mg total) by mouth daily. 07/03/24  Yes Von Bellis, MD  folic acid  (FOLVITE ) 1 MG tablet Take 1 tablet (1 mg total) by mouth daily. 07/03/24 10/01/24 Yes Von Bellis, MD  iron  polysaccharides (NIFEREX) 150 MG capsule Take 1 capsule (150 mg total) by mouth daily. 07/03/24 10/01/24 Yes Von Bellis, MD  isosorbide  mononitrate (IMDUR ) 60 MG 24 hr tablet Take 1 tablet (60 mg total) by mouth daily. 07/03/24 07/03/25 Yes Von Bellis, MD  losartan  (COZAAR ) 25 MG tablet Take 0.5 tablets (12.5 mg total) by mouth daily. 07/04/24 07/04/25 Yes Von Bellis, MD  metoprolol  succinate (TOPROL -XL) 25 MG 24 hr tablet Take 0.5 tablets (12.5 mg total) by mouth daily. 07/03/24  Yes Von Bellis, MD  pantoprazole  (PROTONIX ) 40 MG tablet Take 1 tablet (40 mg total) by mouth daily. 07/03/24 10/01/24 Yes Von Bellis, MD  pravastatin  (PRAVACHOL ) 20 MG tablet Take 1 tablet (20 mg total) by mouth at bedtime. 07/03/24 07/03/25 Yes Von Bellis, MD  spironolactone  (ALDACTONE ) 25 MG tablet Take 0.5 tablets (12.5 mg total) by mouth daily. 07/03/24  Yes Von Bellis, MD  torsemide  (DEMADEX ) 10 MG tablet Take 1 tablet (10 mg total) by mouth daily. 07/03/24  Yes Von Bellis, MD     Critical care time: 40 minutes       Lonell Moose, [facility]  Pulmonary/Critical Care Pager [phone] (please enter 7 digits) PCCM Consult Pager [phone] (please enter 7 digits)

## 2024-07-15 ENCOUNTER — Inpatient Hospital Stay

## 2024-07-15 DIAGNOSIS — I5031 Acute diastolic (congestive) heart failure: Secondary | ICD-10-CM | POA: Diagnosis not present

## 2024-07-15 DIAGNOSIS — I350 Nonrheumatic aortic (valve) stenosis: Secondary | ICD-10-CM | POA: Diagnosis not present

## 2024-07-15 DIAGNOSIS — G928 Other toxic encephalopathy: Secondary | ICD-10-CM | POA: Diagnosis not present

## 2024-07-15 DIAGNOSIS — R7401 Elevation of levels of liver transaminase levels: Secondary | ICD-10-CM | POA: Diagnosis not present

## 2024-07-15 LAB — TYPE AND SCREEN
ABO/RH(D): A POS
Antibody Screen: NEGATIVE
Unit division: 0

## 2024-07-15 LAB — CBC
HCT: 27.8 % — ABNORMAL LOW (ref 39.0–52.0)
Hemoglobin: 8.6 g/dL — ABNORMAL LOW (ref 13.0–17.0)
MCH: 26.9 pg (ref 26.0–34.0)
MCHC: 30.9 g/dL (ref 30.0–36.0)
MCV: 86.9 fL (ref 80.0–100.0)
Platelets: 227 K/uL (ref 150–400)
RBC: 3.2 MIL/uL — ABNORMAL LOW (ref 4.22–5.81)
RDW: 15.5 % (ref 11.5–15.5)
WBC: 12.4 K/uL — ABNORMAL HIGH (ref 4.0–10.5)
nRBC: 0 % (ref 0.0–0.2)

## 2024-07-15 LAB — BASIC METABOLIC PANEL WITH GFR
Anion gap: 9 (ref 5–15)
BUN: 25 mg/dL — ABNORMAL HIGH (ref 8–23)
CO2: 28 mmol/L (ref 22–32)
Calcium: 8.2 mg/dL — ABNORMAL LOW (ref 8.9–10.3)
Chloride: 102 mmol/L (ref 98–111)
Creatinine, Ser: 0.92 mg/dL (ref 0.61–1.24)
GFR, Estimated: >60 mL/min (ref >60)
Glucose, Bld: 117 mg/dL — ABNORMAL HIGH (ref 70–99)
Potassium: 3.7 mmol/L (ref 3.5–5.1)
Sodium: 139 mmol/L (ref 135–145)

## 2024-07-15 LAB — GLUCOSE, CAPILLARY
Glucose-Capillary: 108 mg/dL — ABNORMAL HIGH (ref 70–99)
Glucose-Capillary: 109 mg/dL — ABNORMAL HIGH (ref 70–99)
Glucose-Capillary: 111 mg/dL — ABNORMAL HIGH (ref 70–99)
Glucose-Capillary: 116 mg/dL — ABNORMAL HIGH (ref 70–99)
Glucose-Capillary: 116 mg/dL — ABNORMAL HIGH (ref 70–99)
Glucose-Capillary: 137 mg/dL — ABNORMAL HIGH (ref 70–99)
Glucose-Capillary: 148 mg/dL — ABNORMAL HIGH (ref 70–99)

## 2024-07-15 LAB — BPAM RBC
Blood Product Expiration Date: 202508262359
ISSUE DATE / TIME: 202507280843
Unit Type and Rh: 6200

## 2024-07-15 LAB — LEGIONELLA PNEUMOPHILA SEROGP 1 UR AG: L. pneumophila Serogp 1 Ur Ag: NEGATIVE

## 2024-07-15 LAB — MAGNESIUM: Magnesium: 2.4 mg/dL (ref 1.7–2.4)

## 2024-07-15 LAB — PHOSPHORUS: Phosphorus: 2.7 mg/dL (ref 2.5–4.6)

## 2024-07-15 MED ORDER — VITAMIN C 500 MG PO TABS
500.0000 mg | ORAL_TABLET | Freq: Every day | ORAL | Status: DC
Start: 2024-07-16 — End: 2024-07-16
  Administered 2024-07-16: 500 mg via ORAL
  Filled 2024-07-15: qty 1

## 2024-07-15 MED ORDER — VITAMIN C 500 MG PO TABS: 500.0000 mg | ORAL_TABLET | Freq: Every day | ORAL | Status: DC

## 2024-07-15 MED ORDER — ADULT MULTIVITAMIN W/MINERALS CH
1.0000 | ORAL_TABLET | Freq: Every day | ORAL | Status: DC
  Administered 2024-07-16: 1 via ORAL
  Filled 2024-07-15: qty 1

## 2024-07-15 MED ORDER — ENOXAPARIN SODIUM 40 MG/0.4ML IJ SOSY
40.0000 mg | PREFILLED_SYRINGE | Freq: Every day | INTRAMUSCULAR | Status: DC
  Administered 2024-07-15 – 2024-07-30 (×19): 40 mg via SUBCUTANEOUS
  Filled 2024-07-15 (×16): qty 0.4

## 2024-07-15 MED ORDER — CLOPIDOGREL BISULFATE 75 MG PO TABS
75.0000 mg | ORAL_TABLET | Freq: Every day | ORAL | Status: DC
  Administered 2024-07-16: 75 mg via ORAL
  Filled 2024-07-15: qty 1

## 2024-07-15 MED ORDER — FOLIC ACID 1 MG PO TABS: 1.0000 mg | ORAL_TABLET | Freq: Every day | ORAL | Status: DC

## 2024-07-15 MED ORDER — DIAZEPAM 5 MG PO TABS
10.0000 mg | ORAL_TABLET | Freq: Two times a day (BID) | ORAL | Status: DC
  Administered 2024-07-15: 10 mg via ORAL
  Filled 2024-07-15: qty 2

## 2024-07-15 MED ORDER — ROSUVASTATIN CALCIUM 10 MG PO TABS: 20.0000 mg | ORAL_TABLET | Freq: Every day | ORAL | Status: DC

## 2024-07-15 MED ORDER — PROSOURCE TF20 ENFIT COMPATIBL EN LIQD
60.0000 mL | Freq: Two times a day (BID) | ENTERAL | Status: DC
  Administered 2024-07-15: 60 mL

## 2024-07-15 MED ORDER — METOPROLOL TARTRATE 25 MG PO TABS: 12.5000 mg | ORAL_TABLET | Freq: Two times a day (BID) | ORAL | Status: DC

## 2024-07-15 MED ORDER — THIAMINE HCL 100 MG/ML IJ SOLN
100.0000 mg | Freq: Every day | INTRAMUSCULAR | Status: DC
  Administered 2024-07-21 – 2024-07-22 (×2): 100 mg via INTRAVENOUS
  Filled 2024-07-15 (×2): qty 2

## 2024-07-15 MED ORDER — FREE WATER
30.0000 mL | Status: DC
  Administered 2024-07-15 – 2024-07-21 (×37): 30 mL

## 2024-07-15 MED ORDER — POLYSACCHARIDE IRON COMPLEX 150 MG PO CAPS
150.0000 mg | ORAL_CAPSULE | Freq: Every day | ORAL | Status: DC
Start: 2024-07-16 — End: 2024-07-16
  Administered 2024-07-16: 150 mg via ORAL
  Filled 2024-07-15: qty 1

## 2024-07-15 MED ORDER — DIAZEPAM 5 MG PO TABS
10.0000 mg | ORAL_TABLET | Freq: Two times a day (BID) | ORAL | Status: DC
  Administered 2024-07-15: 10 mg
  Filled 2024-07-15: qty 2

## 2024-07-15 MED ORDER — OSMOLITE 1.5 CAL PO LIQD
1000.0000 mL | ORAL | Status: DC
  Administered 2024-07-15: 1000 mL

## 2024-07-15 MED ORDER — ASPIRIN 81 MG PO CHEW
81.0000 mg | CHEWABLE_TABLET | Freq: Every day | ORAL | Status: DC
  Administered 2024-07-16: 81 mg via ORAL
  Filled 2024-07-15: qty 1

## 2024-07-15 MED ORDER — CLOPIDOGREL BISULFATE 75 MG PO TABS: 75.0000 mg | ORAL_TABLET | Freq: Every day | ORAL | Status: DC

## 2024-07-15 MED ORDER — THIAMINE HCL 100 MG/ML IJ SOLN
250.0000 mg | Freq: Every day | INTRAVENOUS | Status: AC
  Administered 2024-07-19 – 2024-07-20 (×2): 250 mg via INTRAVENOUS
  Filled 2024-07-15 (×2): qty 2.5

## 2024-07-15 MED ORDER — VITAMIN D 25 MCG (1000 UNIT) PO TABS
2000.0000 [IU] | ORAL_TABLET | Freq: Every day | ORAL | Status: DC
  Administered 2024-07-16: 2000 [IU] via ORAL
  Filled 2024-07-15: qty 2

## 2024-07-15 MED ORDER — FOLIC ACID 1 MG PO TABS
1.0000 mg | ORAL_TABLET | Freq: Every day | ORAL | Status: DC
Start: 2024-07-16 — End: 2024-07-16
  Administered 2024-07-16: 1 mg via ORAL
  Filled 2024-07-15: qty 1

## 2024-07-15 MED ORDER — CHOLECALCIFEROL 10 MCG/ML (400 UNIT/ML) PO LIQD: 2000.0000 [IU] | Freq: Every day | ORAL | Status: DC

## 2024-07-15 MED ORDER — POLYSACCHARIDE IRON COMPLEX 150 MG PO CAPS: 150.0000 mg | ORAL_CAPSULE | Freq: Every day | ORAL | Status: DC

## 2024-07-15 MED ORDER — THIAMINE HCL 100 MG/ML IJ SOLN
500.0000 mg | Freq: Three times a day (TID) | INTRAVENOUS | Status: AC
  Administered 2024-07-15 – 2024-07-18 (×9): 500 mg via INTRAVENOUS
  Filled 2024-07-15 (×10): qty 5

## 2024-07-15 MED ORDER — METOPROLOL TARTRATE 25 MG PO TABS
12.5000 mg | ORAL_TABLET | Freq: Two times a day (BID) | ORAL | Status: DC
  Administered 2024-07-15: 12.5 mg via ORAL
  Filled 2024-07-15: qty 1

## 2024-07-15 MED ORDER — ASPIRIN 81 MG PO CHEW: 81.0000 mg | CHEWABLE_TABLET | Freq: Every day | ORAL | Status: DC

## 2024-07-15 MED ORDER — ROSUVASTATIN CALCIUM 10 MG PO TABS
20.0000 mg | ORAL_TABLET | Freq: Every day | ORAL | Status: DC
  Administered 2024-07-16: 20 mg via ORAL
  Filled 2024-07-15: qty 2

## 2024-07-15 MED ORDER — ADULT MULTIVITAMIN W/MINERALS CH: 1.0000 | ORAL_TABLET | Freq: Every day | ORAL | Status: DC

## 2024-07-15 NOTE — Progress Notes (Signed)
 [facility] CLINIC CARDIOLOGY PROGRESS NOTE       Patient ID: [name] MRN: [id number] DOB/AGE: 02-25-1961 63 y.o.  Admit date: 07/11/2024 Referring Physician Dr. Charlie Sellar Primary Physician Osa Geralds, NP  Primary Cardiologist Tinnie Maiden, NP  Reason for Consultation elevated troponins  HPI: [name] is a 63 y.o. 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 Found to have pneumonia, overnight troponins uptrending. Cardiology was consulted for further evaluation.   Interval history: -Patient seen and examined this AM, resting in bed. Patient sedated with Precedex  due to agitation in setting of alcohol  withdrawal. -Remains on 1L [place].  -BP soft, HR stable this AM. -Patient received transfusion yesterday, Hgb improved to 8.6 this AM -NGT placed for tube feeds and medication  Review of systems complete and found to be negative unless listed above    Past Medical History:  Diagnosis Date   COPD (chronic obstructive pulmonary disease) (HCC)    CVA (cerebral infarction)    Headache    mild, since stroke 2012   Hypertension    MI (myocardial infarction) (HCC) 05/09/2012   Reading difficulty    pt reports he reads at about a second grade level   Stroke [facility]) 2012   numbness to left hand    Wears dentures    full upper and lower    Past Surgical History:  Procedure Laterality Date   CAROTID ENDARTERECTOMY Left    2012 or 2013   CHOLECYSTECTOMY N/A 07/26/2016   Procedure: LAPAROSCOPIC CHOLECYSTECTOMY;  Surgeon: Charlie FORBES Fell, MD;  Location: [facility] ORS;  Service: General;  Laterality: N/A;   COLONOSCOPY N/A 05/08/2024   Procedure:  COLONOSCOPY;  Surgeon: Jinny Carmine, MD;  Location: [facility] ENDOSCOPY;  Service: Endoscopy;  Laterality: N/A;   COLONOSCOPY WITH PROPOFOL  N/A 02/09/2022   Procedure: COLONOSCOPY WITH PROPOFOL ;  Surgeon: Jinny Carmine, MD;  Location: [facility] ENDOSCOPY;  Service: Endoscopy;  Laterality: N/A;   CORONARY ARTERY BYPASS GRAFT  05/10/2012   3 vessel   CORONARY STENT INTERVENTION N/A 07/02/2024   Procedure: CORONARY STENT INTERVENTION;  Surgeon: Katina Albright, MD;  Location: [facility] INVASIVE CV LAB;  Service: Cardiovascular;  Laterality: N/A;   CORONARY ULTRASOUND/IVUS N/A 07/02/2024   Procedure: Coronary Ultrasound/IVUS;  Surgeon: Katina Albright, MD;  Location: [facility] INVASIVE CV LAB;  Service: Cardiovascular;  Laterality: N/A;   ENTEROSCOPY N/A 05/16/2023   Procedure: ENTEROSCOPY;  Surgeon: Therisa Bi, MD;  Location: [facility] ENDOSCOPY;  Service: Gastroenterology;  Laterality: N/A;   ENTEROSCOPY N/A 06/01/2023   Procedure: ENTEROSCOPY;  Surgeon: Unk Corinn Skiff, MD;  Location: [facility] ENDOSCOPY;  Service: Gastroenterology;  Laterality: N/A;   ESOPHAGOGASTRODUODENOSCOPY  05/13/2023   Procedure: ESOPHAGOGASTRODUODENOSCOPY (EGD);  Surgeon: Jinny Carmine, MD;  Location: [facility] ENDOSCOPY;  Service: Endoscopy;;   ESOPHAGOGASTRODUODENOSCOPY (EGD) WITH PROPOFOL  N/A 10/28/2022   Procedure: ESOPHAGOGASTRODUODENOSCOPY (EGD) WITH PROPOFOL ;  Surgeon: Onita Elspeth Sharper, DO;  Location: [facility] ENDOSCOPY;  Service: Gastroenterology;  Laterality: N/A;   GIVENS CAPSULE STUDY  05/13/2023   Procedure: GIVENS CAPSULE STUDY;  Surgeon: Jinny Carmine, MD;  Location: [facility] ENDOSCOPY;  Service: Endoscopy;;   GIVENS CAPSULE STUDY  06/01/2023   Procedure: GIVENS CAPSULE STUDY;  Surgeon: Unk Corinn Skiff, MD;  Location: [facility] ENDOSCOPY;  Service: Gastroenterology;;   LEFT HEART CATH AND CORS/GRAFTS ANGIOGRAPHY N/A 07/02/2024   Procedure: LEFT HEART CATH AND CORS/GRAFTS ANGIOGRAPHY;  Surgeon: Katina Albright, MD;  Location: [facility] INVASIVE CV LAB;  Service:  Cardiovascular;  Laterality: N/A;    Medications Prior to Admission  Medication Sig Dispense Refill Last Dose/Taking   albuterol  (VENTOLIN  HFA) 108 (90 Base) MCG/ACT inhaler Inhale 2 puffs into the lungs every 6 (six) hours as needed for wheezing or shortness of breath. 6.7 g 2 07/11/2024 Morning   ascorbic acid  (VITAMIN C ) 500 MG tablet Take 1 tablet (500 mg total) by mouth daily. 30 tablet 2 07/11/2024 Morning   aspirin  EC 81 MG tablet Take 1 tablet (81 mg total) by mouth daily. Swallow whole. 30 tablet 5 07/11/2024 Morning   clopidogrel  (PLAVIX ) 75 MG tablet Take 1 tablet (75 mg total) by mouth daily with breakfast. 30 tablet 11 07/11/2024 Morning   dapagliflozin  propanediol (FARXIGA ) 10 MG TABS tablet Take 1 tablet (10 mg total) by mouth daily. 30 tablet 11 07/11/2024 Morning   folic acid  (FOLVITE ) 1 MG tablet Take 1 tablet (1 mg total) by mouth daily. 30 tablet 2 07/11/2024   iron  polysaccharides (NIFEREX) 150 MG capsule Take 1 capsule (150 mg total) by mouth daily. 30 capsule 2 07/11/2024   isosorbide  mononitrate (IMDUR ) 60 MG 24 hr tablet Take 1 tablet (60 mg total) by mouth daily. 30 tablet 11 07/11/2024   losartan  (COZAAR ) 25 MG tablet Take 0.5 tablets (12.5 mg total) by mouth daily. 30 tablet 11 07/11/2024 Morning   metoprolol  succinate (TOPROL -XL) 25 MG 24 hr tablet Take 0.5 tablets (12.5 mg total) by mouth daily. 30 tablet 11 07/11/2024 Morning   pantoprazole  (PROTONIX ) 40 MG tablet Take 1 tablet (40 mg total) by mouth daily. 30 tablet 2 07/11/2024 Morning   pravastatin  (PRAVACHOL ) 20 MG tablet Take 1 tablet (20 mg total) by mouth at bedtime. 30 tablet 11 07/10/2024 Evening   spironolactone  (ALDACTONE ) 25 MG tablet Take 0.5 tablets (12.5 mg total) by mouth daily. 30 tablet 11 07/11/2024 Morning   torsemide  (DEMADEX ) 10 MG tablet Take 1 tablet (10 mg total) by mouth daily. 30 tablet 11 07/11/2024   Social History   Socioeconomic History   Marital status: Divorced    Spouse name: Not on file    Number of children: Not on file   Years of education: Not on file   Highest education level: Not on file  Occupational History   Not on file  Tobacco Use   Smoking status: Former    Current packs/day: 0.00    Types: Cigarettes    Quit date: 56    Years since quitting: 32.5   Smokeless tobacco: Current    Types: Snuff   Tobacco comments:    changed to dip  Vaping Use   Vaping status: Never Used  Substance and Sexual Activity   Alcohol  use: Yes    Alcohol /week: 26.0 standard drinks of alcohol     Types: 26 Cans of beer per week   Drug use: No   Sexual activity: Yes    Birth control/protection: None  Other Topics Concern   Not on file  Social History Narrative   Not on file   Social Drivers of Health   Financial Resource Strain: Not on file  Food Insecurity: No Food Insecurity (07/12/2024)   Hunger Vital Sign    Worried About Running Out of Food in the Last Year: Never true    Ran Out  of Food in the Last Year: Never true  Transportation Needs: No Transportation Needs (07/12/2024)   PRAPARE - Administrator, Civil Service (Medical): No    Lack of Transportation (Non-Medical): No  Physical Activity: Not on file  Stress: Not on file  Social Connections: Socially Isolated (07/12/2024)   Social Connection and Isolation Panel    Frequency of Communication with Friends and Family: Once a week    Frequency of Social Gatherings with Friends and Family: Once a week    Attends Religious Services: Never    Database administrator or Organizations: No    Attends Banker Meetings: Never    Marital Status: Divorced  Catering manager Violence: Not At Risk (07/12/2024)   Humiliation, Afraid, Rape, and Kick questionnaire    Fear of Current or Ex-Partner: No    Emotionally Abused: No    Physically Abused: No    Sexually Abused: No    Family History  Problem Relation Age of Onset   Pneumonia Mother      Vitals:   07/15/24 0945 07/15/24 1000 07/15/24 1100  07/15/24 1200  BP: 95/62 110/80 101/72 92/63  Pulse: 82 83 81 72  Resp:  16 19 13   Temp:    97.8 F (36.6 C)  TempSrc:      SpO2:  98% 96% 94%  Weight:      Height:        PHYSICAL EXAM General: Ill appearing male, well nourished, in no acute distress, sedated HEENT: Normocephalic and atraumatic. Neck: No JVD.  Lungs: Normal respiratory effort on 1L [place]. Diminished breath sounds bilaterally. Heart: HRRR, elevated rate. Normal S1 and S2 without gallops. +systolic ejection murmur.   Abdomen: Non-distended appearing.  Msk: Normal strength and tone for age. Extremities: Warm and well perfused. No clubbing, cyanosis, edema.   Labs: Basic Metabolic Panel: Recent Labs    07/14/24 0535 07/15/24 0307  NA 138 139  K 4.0 3.7  CL 97* 102  CO2 28 28  GLUCOSE 133* 117*  BUN 28* 25*  CREATININE 1.13 0.92  CALCIUM  8.3* 8.2*  MG 2.2 2.4  PHOS  --  2.7   Liver Function Tests: Recent Labs    07/14/24 0535  AST 41  ALT 18  ALKPHOS 31*  BILITOT 1.3*  PROT 6.6  ALBUMIN 3.2*   No results for input(s): LIPASE, AMYLASE in the last 72 hours. CBC: Recent Labs    07/14/24 0645 07/14/24 1632 07/15/24 0307  WBC 8.8  --  12.4*  HGB 7.2* 8.7* 8.6*  HCT 22.8* 27.5* 27.8*  MCV 86.4  --  86.9  PLT 232  --  227   Cardiac Enzymes: Recent Labs    07/12/24 1537 07/12/24 1740  TROPONINIHS 5,766* 5,756*   BNP: No results for input(s): BNP in the last 72 hours.  D-Dimer: No results for input(s): DDIMER in the last 72 hours. Hemoglobin A1C: No results for input(s): HGBA1C in the last 72 hours. Fasting Lipid Panel: No results for input(s): CHOL, HDL, LDLCALC, TRIG, CHOLHDL, LDLDIRECT in the last 72 hours. Thyroid Function Tests: No results for input(s): TSH, T4TOTAL, T3FREE, THYROIDAB in the last 72 hours.  Invalid input(s): FREET3 Anemia Panel: No results for input(s): VITAMINB12, FOLATE, FERRITIN, TIBC, IRON , RETICCTPCT in the last  72 hours.   Radiology: DG Abd 1 View Result Date: 07/15/2024 CLINICAL DATA:  Orogastric tube placement. EXAM: ABDOMEN - 1 VIEW COMPARISON:  June 29, 2023. FINDINGS: Distal tip of nasogastric tube  is seen in expected position of proximal stomach. IMPRESSION: Nasogastric tube tip seen in expected position of proximal stomach. Electronically Signed   By: Lynwood Landy Raddle M.D.   On: 07/15/2024 10:25   DG Chest Port 1 View Result Date: 07/13/2024 CLINICAL DATA:  Respiratory failure with hypoxia. EXAM: PORTABLE CHEST 1 VIEW COMPARISON:  Multiple previous chest x-rays and recent chest CT. FINDINGS: Stable surgical changes from bypass surgery. The cardiac silhouette, mediastinal and hilar contours are within normal limits and stable. Underlying emphysematous changes and pulmonary scarring. No pleural effusions or discrete pulmonary infiltrates. No pneumothorax. IMPRESSION: Emphysematous changes and pulmonary scarring but no acute overlying pulmonary process. Electronically Signed   By: MYRTIS Stammer M.D.   On: 07/13/2024 16:08   US  Abdomen Limited RUQ (LIVER/GB) Result Date: 07/13/2024 CLINICAL DATA:  Portal hypertension. EXAM: ULTRASOUND ABDOMEN LIMITED RIGHT UPPER QUADRANT COMPARISON:  Abdominal ultrasound 10/19/2022 FINDINGS: Gallbladder: Surgically absent. Common bile duct: Diameter: 3.3 mm Liver: No focal lesion identified. Parenchymal echogenicity is increased and diffusely heterogeneous. There is lobular liver contour. Portal vein is patent on color Doppler imaging with normal direction of blood flow towards the liver. Other: None. IMPRESSION: Lobular liver contour with increased echogenicity and heterogeneous parenchyma consistent with cirrhosis. No focal liver lesion identified. Electronically Signed   By: Greig Pique M.D.   On: 07/13/2024 16:03   DG Chest Port 1 View Result Date: 07/12/2024 CLINICAL DATA:  Shortness of breath. EXAM: PORTABLE CHEST 1 VIEW COMPARISON:  07/11/2024 FINDINGS: The cardio  pericardial silhouette is enlarged. There is pulmonary vascular congestion without overt pulmonary edema. Trace bilateral pleural effusions. Old posterior left sixth rib fracture. Telemetry leads overlie the chest. IMPRESSION: Enlargement of the cardiopericardial silhouette with new pulmonary vascular congestion and trace bilateral pleural effusions. Electronically Signed   By: Camellia Candle M.D.   On: 07/12/2024 12:11   CT Angio Chest PE W/Cm &/Or Wo Cm Result Date: 07/11/2024 CLINICAL DATA:  High probability for PE. Chest pain with shortness of breath EXAM: CT ANGIOGRAPHY CHEST WITH CONTRAST TECHNIQUE: Multidetector CT imaging of the chest was performed using the standard protocol during bolus administration of intravenous contrast. Multiplanar CT image reconstructions and MIPs were obtained to evaluate the vascular anatomy. RADIATION DOSE REDUCTION: This exam was performed according to the departmental dose-optimization program which includes automated exposure control, adjustment of the mA and/or kV according to patient size and/or use of iterative reconstruction technique. CONTRAST:  75mL OMNIPAQUE  IOHEXOL  350 MG/ML SOLN COMPARISON:  CT angiogram chest 05/09/2024 FINDINGS: Cardiovascular: Satisfactory opacification of the pulmonary arteries to the segmental level. No evidence of pulmonary embolism. Patient is status post cardiac surgery. The heart is enlarged. There is no pericardial effusion. There are atherosclerotic calcifications of the aorta and coronary arteries. Aorta is normal in size. Mediastinum/Nodes: There are enlarged right hilar lymph nodes measuring up to 11 mm. Enlarged paratracheal lymph nodes measure up to 10 mm. Visualized thyroid gland and esophagus are within normal limits. Lungs/Pleura: Moderate emphysema again seen. There is stable scarring in the right lung apex. There is new patchy slightly nodular airspace disease in the right upper lobe measuring 2.0 by 1.1 cm. There is right-sided  central peribronchial wall thickening. Previously identified airspace disease in the left lower lobe has resolved. There is no pleural effusion or pneumothorax. There is a stable left lower lobe pulmonary nodule image 6/57. Upper Abdomen: No acute abnormality. Musculoskeletal: No chest wall abnormality. No acute or significant osseous findings. Review of the MIP images confirms the  above findings. IMPRESSION: 1. No evidence for pulmonary embolism. 2. New patchy slightly nodular airspace disease in the right upper lobe worrisome for pneumonia. Follow-up CT recommended in 4-6 weeks recommended to ensure resolution and to exclude underlying nodule. 3. Right-sided central peribronchial wall thickening worrisome for bronchitis. 4. Right hilar and mediastinal lymphadenopathy, likely reactive. 5. Stable left lower lobe pulmonary nodule. 6. Cardiomegaly. Aortic Atherosclerosis (ICD10-I70.0) and Emphysema (ICD10-J43.9). Electronically Signed   By: Greig Pique M.D.   On: 07/11/2024 18:54   DG Chest Portable 1 View Result Date: 07/11/2024 CLINICAL DATA:  SOB EXAM: PORTABLE CHEST - 1 VIEW COMPARISON:  June 24, 2024 FINDINGS: Biapical pleural thickening. Sternotomy wires and CABG markers. No focal airspace consolidation, pleural effusion, or pneumothorax. Mild cardiomegaly. Tortuous aorta with aortic atherosclerosis. No acute fracture or destructive lesions. Multilevel thoracic osteophytosis. IMPRESSION: No acute cardiopulmonary abnormality. Electronically Signed   By: Rogelia Myers M.D.   On: 07/11/2024 17:25   CARDIAC CATHETERIZATION Result Date: 07/02/2024   Prox LAD to Mid LAD lesion is 90% stenosed.   Ost LM to Mid LM lesion is 50% stenosed.   Ost Cx to Prox Cx lesion is 90% stenosed.   Ost RCA to Prox RCA lesion is 100% stenosed.   Mid Graft to Dist Graft lesion is 95% stenosed.   Recommend uninterrupted dual antiplatelet therapy with Aspirin  81mg  daily and Clopidogrel  75mg  daily for a minimum of 6 months (stable  ischemic heart disease-Class I recommendation). 1.  Severe native three-vessel CAD 2.  LIMA to LAD and SVG to OM widely patent 3.  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  Successful direct stenting of the distal SVG to PDA with distal protection with a 4.0 x 15 mm drug-eluting stent with intravascular ultrasound guidance 5.  Aspirin  and clopidogrel  for at least 6 months followed by clopidogrel  indefinitely 6.  Continue workup for aortic valve replacement   DG Chest 2 View Result Date: 06/24/2024 CLINICAL DATA:  sob EXAM: CHEST - 2 VIEW COMPARISON:  June 03, 2024 FINDINGS: Biapical pleural thickening. No focal airspace consolidation, pleural effusion, or pneumothorax. No cardiomegaly. Sternotomy wires and CABG markers. Tortuous aorta with aortic atherosclerosis. No acute fracture or destructive lesions. Multilevel thoracic osteophytosis. Osteopenia. IMPRESSION: No acute cardiopulmonary abnormality. Electronically Signed   By: Rogelia Myers M.D.   On: 06/24/2024 11:54    ECHO 04/2024: 1. Left ventricular ejection fraction, by estimation, is 60 to 65%. The left ventricle has normal function. The left ventricle has no regional wall motion abnormalities. The left ventricular internal cavity size was mildly dilated. Left ventricular diastolic parameters are consistent with Grade I diastolic dysfunction (impaired relaxation).   2. Right ventricular systolic function is normal. The right ventricular  size is normal.   3. The mitral valve is normal in structure. No evidence of mitral valve  regurgitation.   4. The aortic valve is normal in structure. Aortic valve regurgitation is  trivial. Severe aortic valve stenosis.   TELEMETRY reviewed by me 07/15/2024: sinus rhythm, rate 60s  EKG reviewed by me: sinus tachycardia ST depression, rate 129 bpm  Data reviewed by me 07/15/2024: last 24h vitals tele labs imaging I/O hospitalist progress note, PCCM notes  Principal Problem:   Acute  respiratory distress Active Problems:   Hyperlipidemia   Cognitive impairment   Alcohol  abuse   Essential hypertension   CAD (coronary artery disease)   Stroke (HCC)   Iron  deficiency anemia   Acute on chronic diastolic CHF (  congestive heart failure) (HCC)   COPD exacerbation (HCC)   Elevated lactic acid level   Lobar pneumonia (HCC)   Metabolic acidosis, increased anion gap   Myocardial injury    ASSESSMENT AND PLAN:  [name] is a 63 y.o. 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 Found to have pneumonia, overnight troponins uptrending. Cardiology was consulted for further evaluation.   # Acute hypoxic respiratory failure # HCAP # COPD exacerbation # Acute on chronic HFpEF (flash pulmonary edema) # Severe aortic stenosis Presented with SOB, cough, imaging concerning for HCAP/COPD exacerbation. LA elevated on presentation at 3.6 > 5.9> 3.4. BNP initially 222 >1500. With worsening respiratory status 7/26, started on BiPAP and transferred to the ICU, weaned to 1L [place]. Patient fluid status improved. NGT placed on 07/29 for tube feeds and medications due to severe DT's alcohol  withdrawal requiring sedation. -Continue IV lasix  40 mg to daily. Avoid aggressive diuresis in setting of severe AS.   -Continue home dapagliflozin  10 mg daily.  -Further management of COPD exacerbation/PNA as per primary team.  -CIWA protocol given hx of alcohol  abuse. -Scheduled to see Dr. Katina 7/30 to discuss aortic valve repair/replacement options. Dr. Katina can see patient as an inpatient if patient not sedated so there is no further delay for TAVR evaluation.  #  NSTEMI # Coronary artery disease s/p CABG x3 (04/2012) # Hypertension # Hyperlipidemia With recent LHC, s/p DES to SVG to PDA 07/02/2024. Reported some CP when he first came to hospital which has now resolved, no recurrence.  Troponin trended 44 > 144 > 205 > 980 > 1862 > 5766 > 5756. Cr stable. -Continue home plavix  75 mg daily. Continue aspirin  81 mg daily. DAPT given recent DES. -Started on heparin  gtt by [facility] 7/26, Low Hgb. Completed 24 hours of heparin .  -Continue Crestor  20 mg daily for high-intensity statin management.  -Continue home metoprolol  succinate 12.5 mg daily. -Home losartan , Imdur  held due to soft BP. -Recommend MAP > 65.   # Acute on chronic anemia # Hx AVM Patient received blood transfusion on 07/28 with improving Hgb.  -Continue PPIs, GI consulted and following. -Goal Hgb >8 due to cardiac history. Recommend transfusion. 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  Did the patient have an acute coronary syndrome (MI, NSTEMI, STEMI, etc) this admission?:  Yes                               AHA/ACC ACS Clinical Performance & Quality Measures: Aspirin  prescribed? - Yes ADP Receptor Inhibitor (Plavix /Clopidogrel , Brilinta/Ticagrelor or Effient/Prasugrel) prescribed (includes medically managed patients)? - Yes Beta Blocker prescribed? - Yes High Intensity Statin (Lipitor 40-80mg  or Crestor  20-40mg ) prescribed? - Yes EF assessed during THIS hospitalization? - No - patient with recent echo performed on 05/10/2024.  For EF <40%, was ACEI/ARB prescribed? - Not Applicable (EF >/= 40%) For EF <40%, Aldosterone Antagonist (Spironolactone  or Eplerenone) prescribed? - Not Applicable (EF >/= 40%) Cardiac Rehab Phase II ordered (including medically managed patients)? - Yes, Patient with recent outpatient LHC  performed on 07/16, referred to cardiac rehab during that time on 07/04/24.     This  patient's plan of care was discussed and created with Dr. Wilburn and he is in agreement.  Signed: Dorene Comfort, PA-C  07/15/2024, 12:38 PM [facility] Cardiology

## 2024-07-15 NOTE — Progress Notes (Signed)
 NAME:  Khris Jansson, MRN:  [id number], DOB:  01-18-1961, LOS: 4 ADMISSION DATE:  07/11/2024, CONSULTATION DATE: 07/12/24  History of Present Illness:  Patient is a 63 year old male who was admitted to the hospital on 7/25 after presenting with shortness of breath and chest discomfort.   He has a known history of severe aortic stenosis as well as coronary artery disease.  He is previously had CABG as well as PCI.  He also has underlying COPD, HFpEF, alcohol  use disorder, and GI bleed.   Patient was recently in the hospital on 7/16 - 7/17 for left heart cath with stenting to bypass graft (SVG to PDA).  He developed shortness of breath with cough productive of sputum over the past few days prompting presentation to the ED yesterday.   On arrival to the ED, his lower extremities were swollen and he was found to have a wheeze.  He was hypoxic requiring oxygen support via nasal cannula.  Given respiratory distress, he was started on treatment for COPD exacerbation and admitted to the hospitalist service for further management.  CT scan of the chest was negative for PE but did note some patchy nodular airspace disease as well as airway thickening.  He was started on broad-spectrum IV antibiotics, steroids, bronchodilators, and received IV fluids for sepsis given elevated lactic acid.   This morning, he had increased shortness of breath with respiratory distress prompting initiation of BiPAP and transferred to the ICU.  He was also started on dexmedetomidine  for agitation.   Blood work showed normal kidney function, but with rising troponin and a significantly elevated BNP. Hemoglobin has been stable.   Pertinent  Medical History  CAD s/p CABG HFpEF Severe Aortic Stenosis ETOH use COPD on Trelegy  Micro Data:  07/25: COVID>>negative  07/25: Influenza A&B>>negative  07/25: RSV>>negative  07/25: MRSA PCR>>negative  07/25: Blood>>staph in aerobic bottle only likely a contaminant  07/26:  RVP>>negative   Anti-infectives (From admission, onward)    Start     Dose/Rate Route Frequency Ordered Stop   07/14/24 1200  cefTRIAXone  (ROCEPHIN ) 2 g in sodium chloride  0.9 % 100 mL IVPB        2 g 200 mL/hr over 30 Minutes Intravenous Every 24 hours 07/14/24 1030 07/17/24 1159   07/12/24 1600  piperacillin -tazobactam (ZOSYN ) IVPB 3.375 g  Status:  Discontinued        3.375 g 12.5 mL/hr over 240 Minutes Intravenous Every 8 hours 07/12/24 1503 07/14/24 1030   07/12/24 0900  vancomycin  (VANCOREADY) IVPB 1250 mg/250 mL  Status:  Discontinued        1,250 mg 166.7 mL/hr over 90 Minutes Intravenous Every 12 hours 07/11/24 2012 07/12/24 1458   07/11/24 2100  ceFEPIme  (MAXIPIME ) 2 g in sodium chloride  0.9 % 100 mL IVPB  Status:  Discontinued        2 g 200 mL/hr over 30 Minutes Intravenous Every 8 hours 07/11/24 1959 07/12/24 1458   07/11/24 2100  vancomycin  (VANCOREADY) IVPB 2000 mg/400 mL        2,000 mg 200 mL/hr over 120 Minutes Intravenous  Once 07/11/24 2004 07/11/24 2255   07/11/24 1945  cefTRIAXone  (ROCEPHIN ) 1 g in sodium chloride  0.9 % 100 mL IVPB  Status:  Discontinued        1 g 200 mL/hr over 30 Minutes Intravenous  Once 07/11/24 1930 07/11/24 1933   07/11/24 1945  azithromycin  (ZITHROMAX ) 500 mg in sodium chloride  0.9 % 250 mL IVPB  Status:  Discontinued  500 mg 250 mL/hr over 60 Minutes Intravenous  Once 07/11/24 1930 07/11/24 1955   07/11/24 1945  cefTRIAXone  (ROCEPHIN ) 2 g in sodium chloride  0.9 % 100 mL IVPB  Status:  Discontinued        2 g 200 mL/hr over 30 Minutes Intravenous  Once 07/11/24 1933 07/11/24 1955      Significant Hospital Events: Including procedures, antibiotic start and stop dates in addition to other pertinent events   7/25: admit with respiratory failure 7/26: clinically decompensated, worsening respiratory status initiated on BiPAP 7/27: on nasal cannula, reports anxiety and tremors consistent with EtOH withdrawal.  Received a total of 15  mg/kg of phenobarbital , however he remained agitated requiring low dose precedex  gtt.  7/28: Pt resting in bed no agitation on precedex  gtt @0 .5 mcg/min.  Orders placed to transfuse 1 unit of pRBC's hgb 7.2 goal hgb 8.0.  No signs of active bleeding  7/29: Pt remains encephalopathic with severe DT's requiring precedex  gtt.  NGT placed to initiate tube feeds and administer medications   Interim History / Subjective:  As outlined above under significant events   Objective    Blood pressure 101/72, pulse 81, temperature 97.7 F (36.5 C), resp. rate 19, height 6' (1.829 m), weight 95.4 kg, SpO2 96%.        Intake/Output Summary (Last 24 hours) at 07/15/2024 1206 Last data filed at 07/15/2024 0800 Gross per 24 hour  Intake 587.49 ml  Output 350 ml  Net 237.49 ml   Filed Weights   07/14/24 0500 07/15/24 0000 07/15/24 0358  Weight: 101.1 kg 94.5 kg 95.4 kg   Examination: General: Acutely ill appearing male, NAD on 1L O2 via nasal canula  HENT: Supple, no JVD  Lungs: Diminished throughout, even, non labored  Cardiovascular: NSR, s1s2, no m/r/g, 2+ radial/1+ distal pulses, no edema Abdomen: +BS x4, obese, soft, non distended  Extremities: Normal bulk and tone Neuro: Sedated but able to follow commands and oriented to self, PERRL  GU: External catheter   Resolved problem list   Assessment and Plan  #Acute toxic metabolic encephalopathy  #ETOH withdrawal  - Correct metabolic derangements  - Prn precedex  gtt per CIWA protocol  - Scheduled valium  and phenobarbital  taper  - Continue thiamine , mvi, and folic acid   - Maintain sleep/wake cycle - Avoid sedating medications as able  - Once mentation improves will need ETOH cessation counseling   #Severe aortic stenosis  #Elevated troponin's secondary to NSTEMI vs. demand ischemia  #Acute HFpEF  Hx: HTN, MI, CAD, and stroke  - Continuous telemetry monitoring  - Troponin peaked at 5,766 - Cardiology consulted appreciate input: pt to  follow-up outpatient to discuss aortic valve repair/replacement options.  Goal is to optimize him regarding volume and symptom standpoint prior to discharge  - Continue aspirin , plavix , metoprolol , and rosuvastatin   - Completed 24hrs of heparin  gtt  - Continue iv lasix  40 mg daily   #Acute on chronic hypoxic respiratory failure  #AECOPD #Aspiration pneumonia - Supplemental O2 for dysnea and/or hypoxia  - Maintain O2 sats 88% to 92% - Scheduled and prn bronchodilator therapy  - IV steroids discontinued 07/29 - Aggressive pulmonary hygiene   #Aspiration pneumonia  - Trend WBC and monitor fever curve  - Follow cultures  - Continue ceftriaxone : stop date 07/31   #Anemia without signs of bleeding  #Possible GI bleed  Hx: AVM's undergone multiple bidrectional endoscopies without findings of bleeding within the last 2 yrs  - Trend CBC  - Monitor for  s/sx of bleeding  - Transfuse for hgb <8  - Continue scheduled niferex  - GI consulted appreciate input: due to no signs of active bleeding no indications for interventions at this time signed off    #Hyperglycemia likely steroid induced  - CBG's q4hrs  - Follow hypo/hyperglycemic protocol  - Target CBG readings 140 to 180  - IV steroids discontinued   Best Practice (right click and Reselect all SmartList Selections daily)   Diet/type: NPO; TF's  DVT prophylaxis SCD Pressure ulcer(s): N/A GI prophylaxis: PPI Lines: N/A Foley:  N/A Code Status:  full code Last date of multidisciplinary goals of care discussion [07/15/24]  No accurate emergency contacted information listed in pts chart  Labs   CBC: Recent Labs  Lab 07/11/24 1706 07/11/24 2137 07/13/24 0420 07/13/24 1138 07/13/24 1449 07/13/24 1705 07/13/24 2329 07/14/24 0645 07/14/24 1632 07/15/24 0307  WBC 8.6   < > 13.0*  --  22.6*  --  10.1 8.8  --  12.4*  NEUTROABS 4.9  --   --   --   --   --   --   --   --   --   HGB 8.3*   < > 7.5*   < > 8.3* 8.5* 7.1* 7.2*  8.7* 8.6*  HCT 26.7*   < > 24.0*  --  26.0*  --  23.2* 22.8* 27.5* 27.8*  MCV 86.1   < > 85.1  --  85.5  --  85.6 86.4  --  86.9  PLT 339   < > 278  --  346  --  244 232  --  227   < > = values in this interval not displayed.    Basic Metabolic Panel: Recent Labs  Lab 07/11/24 1706 07/12/24 0215 07/12/24 0900 07/13/24 0420 07/14/24 0535 07/15/24 0307  NA 135 136  --  134* 138 139  K 4.3 4.5  --  3.8 4.0 3.7  CL 98 102  --  99 97* 102  CO2 23 15*  --  26 28 28   GLUCOSE 102* 167*  --  183* 133* 117*  BUN 11 13  --  22 28* 25*  CREATININE 0.91 0.94  --  0.90 1.13 0.92  CALCIUM  8.7* 8.6*  --  8.5* 8.3* 8.2*  MG  --   --  1.8 2.1 2.2 2.4  PHOS  --   --   --   --   --  2.7   GFR: Estimated Creatinine Clearance: 95.9 mL/min (by C-G formula based on SCr of 0.92 mg/dL). Recent Labs  Lab 07/11/24 1951 07/11/24 2137 07/12/24 0215 07/12/24 0900 07/12/24 1439 07/13/24 1449 07/13/24 2329 07/14/24 0645 07/15/24 0307  WBC  --  6.2   < > 8.2   < > 22.6* 10.1 8.8 12.4*  LATICACIDVEN 3.6* 5.9*  --  3.4*  --   --   --   --   --    < > = values in this interval not displayed.    Liver Function Tests: Recent Labs  Lab 07/11/24 1706 07/12/24 0900 07/14/24 0535  AST 21 62* 41  ALT 16 18 18   ALKPHOS 43 45 31*  BILITOT 0.9 0.9 1.3*  PROT 7.3 7.3 6.6  ALBUMIN 3.5 3.5 3.2*   No results for input(s): LIPASE, AMYLASE in the last 168 hours. Recent Labs  Lab 07/14/24 0535  AMMONIA 21    ABG    Component Value Date/Time   PHART 7.36 07/12/2024 0814  PCO2ART 38 07/12/2024 0814   PO2ART 364 (H) 07/12/2024 0814   HCO3 21.5 07/12/2024 0814   ACIDBASEDEF 3.6 (H) 07/12/2024 0814   O2SAT 100 07/12/2024 0814     Coagulation Profile: Recent Labs  Lab 07/11/24 1706  INR 1.1    Cardiac Enzymes: No results for input(s): CKTOTAL, CKMB, CKMBINDEX, TROPONINI in the last 168 hours.  HbA1C: Hemoglobin A1C  Date/Time Value Ref Range Status  12/02/2013 03:52 PM 5.3  4.2 - 6.3 % Final    Comment:    The American Diabetes Association recommends that a primary goal of therapy should be <7% and that physicians should reevaluate the treatment regimen in patients with HbA1c values consistently >8%.    Hgb A1c MFr Bld  Date/Time Value Ref Range Status  10/28/2022 04:37 AM 5.3 4.8 - 5.6 % Final    Comment:    (NOTE) Pre diabetes:          5.7%-6.4%  Diabetes:              >6.4%  Glycemic control for   <7.0% adults with diabetes     CBG: Recent Labs  Lab 07/14/24 1942 07/15/24 0041 07/15/24 0342 07/15/24 0826 07/15/24 1142  GLUCAP 125* 109* 111* 108* 116*    Review of Systems:   Unable to assess pt sedated   Past Medical History:  He,  has a past medical history of COPD (chronic obstructive pulmonary disease) (HCC), CVA (cerebral infarction), Headache, Hypertension, MI (myocardial infarction) (HCC) (05/09/2012), Reading difficulty, Stroke (HCC) (2012), and Wears dentures.   Surgical History:   Past Surgical History:  Procedure Laterality Date   CAROTID ENDARTERECTOMY Left    2012 or 2013   CHOLECYSTECTOMY N/A 07/26/2016   Procedure: LAPAROSCOPIC CHOLECYSTECTOMY;  Surgeon: Charlie FORBES Fell, MD;  Location: [facility] ORS;  Service: General;  Laterality: N/A;   COLONOSCOPY N/A 05/08/2024   Procedure: COLONOSCOPY;  Surgeon: Jinny Carmine, MD;  Location: [facility] ENDOSCOPY;  Service: Endoscopy;  Laterality: N/A;   COLONOSCOPY WITH PROPOFOL  N/A 02/09/2022   Procedure: COLONOSCOPY WITH PROPOFOL ;  Surgeon: Jinny Carmine, MD;  Location: [facility] ENDOSCOPY;  Service: Endoscopy;  Laterality: N/A;   CORONARY ARTERY BYPASS GRAFT  05/10/2012   3 vessel   CORONARY STENT INTERVENTION N/A 07/02/2024   Procedure: CORONARY STENT INTERVENTION;  Surgeon: Katina Albright, MD;  Location: [facility] INVASIVE CV LAB;  Service: Cardiovascular;  Laterality: N/A;   CORONARY ULTRASOUND/IVUS N/A 07/02/2024   Procedure: Coronary Ultrasound/IVUS;  Surgeon: Katina Albright, MD;  Location: [facility]  INVASIVE CV LAB;  Service: Cardiovascular;  Laterality: N/A;   ENTEROSCOPY N/A 05/16/2023   Procedure: ENTEROSCOPY;  Surgeon: Therisa Bi, MD;  Location: [facility] ENDOSCOPY;  Service: Gastroenterology;  Laterality: N/A;   ENTEROSCOPY N/A 06/01/2023   Procedure: ENTEROSCOPY;  Surgeon: Unk Corinn Skiff, MD;  Location: [facility] ENDOSCOPY;  Service: Gastroenterology;  Laterality: N/A;   ESOPHAGOGASTRODUODENOSCOPY  05/13/2023   Procedure: ESOPHAGOGASTRODUODENOSCOPY (EGD);  Surgeon: Jinny Carmine, MD;  Location: [facility] ENDOSCOPY;  Service: Endoscopy;;   ESOPHAGOGASTRODUODENOSCOPY (EGD) WITH PROPOFOL  N/A 10/28/2022   Procedure: ESOPHAGOGASTRODUODENOSCOPY (EGD) WITH PROPOFOL ;  Surgeon: Onita Elspeth Sharper, DO;  Location: [facility] ENDOSCOPY;  Service: Gastroenterology;  Laterality: N/A;   GIVENS CAPSULE STUDY  05/13/2023   Procedure: GIVENS CAPSULE STUDY;  Surgeon: Jinny Carmine, MD;  Location: [facility] ENDOSCOPY;  Service: Endoscopy;;   GIVENS CAPSULE STUDY  06/01/2023   Procedure: GIVENS CAPSULE STUDY;  Surgeon: Unk Corinn Skiff, MD;  Location[facility] ENDOSCOPY;  Service: Gastroenterology;;   LEFT HEART CATH AND CORS/GRAFTS ANGIOGRAPHY  N/A 07/02/2024   Procedure: LEFT HEART CATH AND CORS/GRAFTS ANGIOGRAPHY;  Surgeon: Katina Albright, MD;  Location: [facility] INVASIVE CV LAB;  Service: Cardiovascular;  Laterality: N/A;     Social History:   reports that he quit smoking about 32 years ago. His smoking use included cigarettes. His smokeless tobacco use includes snuff. He reports current alcohol  use of about 26.0 standard drinks of alcohol  per week. He reports that he does not use drugs.   Family History:  His family history includes Pneumonia in his mother.   Allergies Allergies  Allergen Reactions   Wellbutrin  [Bupropion ]     Paranoia      Home Medications  Prior to Admission medications   Medication Sig Start Date End Date Taking? Authorizing Provider  albuterol  (VENTOLIN  HFA) 108 (90 Base) MCG/ACT inhaler Inhale 2  puffs into the lungs every 6 (six) hours as needed for wheezing or shortness of breath. 07/03/24  Yes Von Bellis, MD  ascorbic acid  (VITAMIN C ) 500 MG tablet Take 1 tablet (500 mg total) by mouth daily. 07/03/24 10/01/24 Yes Von Bellis, MD  aspirin  EC 81 MG tablet Take 1 tablet (81 mg total) by mouth daily. Swallow whole. 07/03/24 12/30/24 Yes Von Bellis, MD  clopidogrel  (PLAVIX ) 75 MG tablet Take 1 tablet (75 mg total) by mouth daily with breakfast. 07/03/24  Yes Von Bellis, MD  dapagliflozin  propanediol (FARXIGA ) 10 MG TABS tablet Take 1 tablet (10 mg total) by mouth daily. 07/03/24  Yes Von Bellis, MD  folic acid  (FOLVITE ) 1 MG tablet Take 1 tablet (1 mg total) by mouth daily. 07/03/24 10/01/24 Yes Von Bellis, MD  iron  polysaccharides (NIFEREX) 150 MG capsule Take 1 capsule (150 mg total) by mouth daily. 07/03/24 10/01/24 Yes Von Bellis, MD  isosorbide  mononitrate (IMDUR ) 60 MG 24 hr tablet Take 1 tablet (60 mg total) by mouth daily. 07/03/24 07/03/25 Yes Von Bellis, MD  losartan  (COZAAR ) 25 MG tablet Take 0.5 tablets (12.5 mg total) by mouth daily. 07/04/24 07/04/25 Yes Von Bellis, MD  metoprolol  succinate (TOPROL -XL) 25 MG 24 hr tablet Take 0.5 tablets (12.5 mg total) by mouth daily. 07/03/24  Yes Von Bellis, MD  pantoprazole  (PROTONIX ) 40 MG tablet Take 1 tablet (40 mg total) by mouth daily. 07/03/24 10/01/24 Yes Von Bellis, MD  pravastatin  (PRAVACHOL ) 20 MG tablet Take 1 tablet (20 mg total) by mouth at bedtime. 07/03/24 07/03/25 Yes Von Bellis, MD  spironolactone  (ALDACTONE ) 25 MG tablet Take 0.5 tablets (12.5 mg total) by mouth daily. 07/03/24  Yes Von Bellis, MD  torsemide  (DEMADEX ) 10 MG tablet Take 1 tablet (10 mg total) by mouth daily. 07/03/24  Yes Von Bellis, MD     Critical care time: 35 minutes       Lonell Moose, [facility]  Pulmonary/Critical Care Pager [phone] (please enter 7 digits) PCCM Consult Pager [phone] (please enter 7 digits)

## 2024-07-15 NOTE — Progress Notes (Signed)
 Initial Nutrition Assessment  DOCUMENTATION CODES:   Not applicable  INTERVENTION:   Osmolite 1.5@60ml /hr- Initiate at 44ml/hr and increase by 10ml/hr q 8 hours until goal rate is reached.   ProSource TF 20- Give 60ml BID via tube, each supplement provides 80kcal and 20g of protein.   Free water  flushes 30ml q4 hours to maintain tube patency   Regimen provides 2320kcal/day, 130g/day protein and 1265ml/day of free water .   Pt at high refeed risk; recommend monitor potassium, magnesium  and phosphorus labs daily until stable  Continue MVI, thiamine  and folic acid  daily   Cholecalciferol  2,000 units daily via tube   Daily weights   NUTRITION DIAGNOSIS:   Inadequate oral intake related to lethargy/confusion as evidenced by NPO status.  GOAL:   Patient will meet greater than or equal to 90% of their needs  MONITOR:   Diet advancement, Labs, Weight trends, TF tolerance, Skin, I & O's  REASON FOR ASSESSMENT:   Consult Enteral/tube feeding initiation and management  ASSESSMENT:   63 y/o male with h/o HLD, etoh abuse, HTN, CAD s/p CABG x 3 (2013), severe aortic stenosis, CHF, CVA, IDA, COPD, MI, GERD, hiatal hernia, CKD III, intestional AVM, depression and recent admission for elective left heart catheterization s/p left heart cath and stent placement 7/16 and who is now admitted with NSTEMI, CHF/COPD exacerbation, suspected aspiration and etoh withdrawal.  Visited pt's room today. Pt with AMS and is unable to provide any history. Pt NPO. NGT in place (gastric). Plan is to initiate tube feeds today. Pt is at high refeed risk. Pt with lactic acidosis and is receiving high dose IV thiamine . Pt with noted vitamin D  deficiency; will provide supplementation. Per chart, pt is down 20lbs(9%) over the past two months; this is significant weight loss. Pt is weight stable since admission. RD will add supplements with diet advancement. Of note, pt wears dentures.   Medications reviewed and  include: vitamin C , aspirin , plavix , folic acid , lasix , iron , protonix , thiamine , ceftriaxone , precedex    Labs reviewed: K 3.7 wnl, BUN 25(H), P 2.7 wnl, Mg 2.4 wnl BNP- 1552.7(H)- 7/26 Folate 7.0 wnl, B12 937(H), vitamin D  12.02(L)- 5/21 WBC- 12.4(H), Hgb 8.6(L), Hct 27.8(L) Cbgs- 116, 108, 11, 109 x 24 hrs   UOP-   NUTRITION - FOCUSED PHYSICAL EXAM:  Flowsheet Row Most Recent Value  Orbital Region No depletion  Upper Arm Region Mild depletion  Thoracic and Lumbar Region No depletion  Buccal Region No depletion  Temple Region No depletion  Clavicle Bone Region No depletion  Clavicle and Acromion Bone Region No depletion  Scapular Bone Region No depletion  Dorsal Hand No depletion  Patellar Region Moderate depletion  Anterior Thigh Region Moderate depletion  Posterior Calf Region Moderate depletion  Edema (RD Assessment) Mild  Hair Reviewed  Eyes Reviewed  Mouth Reviewed  Skin Reviewed  Nails Reviewed   Diet Order:   Diet Order     None      EDUCATION NEEDS:   No education needs have been identified at this time  Skin:  Skin Assessment: Reviewed RN Assessment (ecchymosis)  Last BM:  pta  Height:   Ht Readings from Last 1 Encounters:  07/11/24 6' (1.829 m)    Weight:   Wt Readings from Last 1 Encounters:  07/15/24 95.4 kg    Ideal Body Weight:  80.9 kg  BMI:  Body mass index is 28.52 kg/m.  Estimated Nutritional Needs:   Kcal:  2300-2600kcal/day  Protein:  115-130g/day  Fluid:  2.0L/day  Augustin Shams MS, RD, LDN If unable to be reached, please send secure chat to RD inpatient available from 8:00a-4:00p daily

## 2024-07-15 NOTE — Plan of Care (Signed)
  Problem: Education: Goal: Knowledge of General Education information will improve Description: Including pain rating scale, medication(s)/side effects and non-pharmacologic comfort measures Outcome: Progressing   Problem: Health Behavior/Discharge Planning: Goal: Ability to manage health-related needs will improve Outcome: Progressing   Problem: Clinical Measurements: Goal: Ability to maintain clinical measurements within normal limits will improve Outcome: Progressing Goal: Will remain free from infection Outcome: Progressing   Problem: Education: Goal: Knowledge of General Education information will improve Description: Including pain rating scale, medication(s)/side effects and non-pharmacologic comfort measures Outcome: Progressing   Problem: Health Behavior/Discharge Planning: Goal: Ability to manage health-related needs will improve Outcome: Progressing   Problem: Clinical Measurements: Goal: Ability to maintain clinical measurements within normal limits will improve Outcome: Progressing Goal: Will remain free from infection Outcome: Progressing

## 2024-07-15 NOTE — Plan of Care (Signed)

## 2024-07-16 ENCOUNTER — Inpatient Hospital Stay

## 2024-07-16 DIAGNOSIS — R0603 Acute respiratory distress: Secondary | ICD-10-CM | POA: Diagnosis not present

## 2024-07-16 DIAGNOSIS — G928 Other toxic encephalopathy: Secondary | ICD-10-CM | POA: Diagnosis not present

## 2024-07-16 DIAGNOSIS — I469 Cardiac arrest, cause unspecified: Secondary | ICD-10-CM

## 2024-07-16 DIAGNOSIS — E876 Hypokalemia: Secondary | ICD-10-CM

## 2024-07-16 DIAGNOSIS — I959 Hypotension, unspecified: Secondary | ICD-10-CM | POA: Diagnosis not present

## 2024-07-16 DIAGNOSIS — J9621 Acute and chronic respiratory failure with hypoxia: Secondary | ICD-10-CM | POA: Diagnosis not present

## 2024-07-16 LAB — BLOOD GAS, ARTERIAL
Acid-Base Excess: 1.8 mmol/L (ref 0.0–2.0)
Bicarbonate: 28.6 mmol/L — ABNORMAL HIGH (ref 20.0–28.0)
FIO2: 100 %
MECHVT: 550 mL
Mechanical Rate: 20
O2 Saturation: 99.7 %
PEEP: 10 cmH2O
Patient temperature: 37
pCO2 arterial: 53 mmHg — ABNORMAL HIGH (ref 32–48)
pH, Arterial: 7.34 — ABNORMAL LOW (ref 7.35–7.45)
pO2, Arterial: 271 mmHg — ABNORMAL HIGH (ref 83–108)

## 2024-07-16 LAB — MAGNESIUM
Magnesium: 2.6 mg/dL — ABNORMAL HIGH (ref 1.7–2.4)
Magnesium: 2.4 mg/dL (ref 1.7–2.4)

## 2024-07-16 LAB — GLUCOSE, CAPILLARY
Glucose-Capillary: 126 mg/dL — ABNORMAL HIGH (ref 70–99)
Glucose-Capillary: 100 mg/dL — ABNORMAL HIGH (ref 70–99)
Glucose-Capillary: 83 mg/dL (ref 70–99)
Glucose-Capillary: 161 mg/dL — ABNORMAL HIGH (ref 70–99)
Glucose-Capillary: 169 mg/dL — ABNORMAL HIGH (ref 70–99)
Glucose-Capillary: 138 mg/dL — ABNORMAL HIGH (ref 70–99)

## 2024-07-16 LAB — CBC
HCT: 28.9 % — ABNORMAL LOW (ref 39.0–52.0)
Hemoglobin: 8.6 g/dL — ABNORMAL LOW (ref 13.0–17.0)
MCH: 26.5 pg (ref 26.0–34.0)
MCHC: 29.8 g/dL — ABNORMAL LOW (ref 30.0–36.0)
MCV: 88.9 fL (ref 80.0–100.0)
Platelets: 234 K/uL (ref 150–400)
RBC: 3.25 MIL/uL — ABNORMAL LOW (ref 4.22–5.81)
RDW: 15.6 % — ABNORMAL HIGH (ref 11.5–15.5)
WBC: 12.6 K/uL — ABNORMAL HIGH (ref 4.0–10.5)
nRBC: 0 % (ref 0.0–0.2)

## 2024-07-16 LAB — COOXEMETRY PANEL
Carboxyhemoglobin: 2.2 % — ABNORMAL HIGH (ref 0.5–1.5)
Methemoglobin: <0.7 % (ref 0.0–1.5)
O2 Saturation: 78.5 %
Total hemoglobin: 9.3 g/dL — ABNORMAL LOW (ref 12.0–16.0)
Total oxygen content: 76.6 %

## 2024-07-16 LAB — BASIC METABOLIC PANEL WITH GFR
Anion gap: 9 (ref 5–15)
Anion gap: 12 (ref 5–15)
BUN: 29 mg/dL — ABNORMAL HIGH (ref 8–23)
BUN: 33 mg/dL — ABNORMAL HIGH (ref 8–23)
CO2: 28 mmol/L (ref 22–32)
CO2: 26 mmol/L (ref 22–32)
Calcium: 8.1 mg/dL — ABNORMAL LOW (ref 8.9–10.3)
Calcium: 7.5 mg/dL — ABNORMAL LOW (ref 8.9–10.3)
Chloride: 104 mmol/L (ref 98–111)
Chloride: 107 mmol/L (ref 98–111)
Creatinine, Ser: 0.99 mg/dL (ref 0.61–1.24)
Creatinine, Ser: 1.27 mg/dL — ABNORMAL HIGH (ref 0.61–1.24)
GFR, Estimated: >60 mL/min (ref >60)
GFR, Estimated: >60 mL/min (ref >60)
Glucose, Bld: 129 mg/dL — ABNORMAL HIGH (ref 70–99)
Glucose, Bld: 173 mg/dL — ABNORMAL HIGH (ref 70–99)
Potassium: 3.2 mmol/L — ABNORMAL LOW (ref 3.5–5.1)
Potassium: 3.5 mmol/L (ref 3.5–5.1)
Sodium: 141 mmol/L (ref 135–145)
Sodium: 145 mmol/L (ref 135–145)

## 2024-07-16 LAB — BLOOD GAS, VENOUS
Acid-base deficit: 0.8 mmol/L (ref 0.0–2.0)
Bicarbonate: 29.3 mmol/L — ABNORMAL HIGH (ref 20.0–28.0)
O2 Saturation: 47.2 %
Patient temperature: 37
pCO2, Ven: 75 mmHg (ref 44–60)
pH, Ven: 7.2 — ABNORMAL LOW (ref 7.25–7.43)
pO2, Ven: 35 mmHg (ref 32–45)

## 2024-07-16 LAB — CULTURE, BLOOD (ROUTINE X 2): Culture: NO GROWTH

## 2024-07-16 LAB — TROPONIN I (HIGH SENSITIVITY)
Troponin I (High Sensitivity): 3191 ng/L (ref <18)
Troponin I (High Sensitivity): 3088 ng/L (ref <18)
Troponin I (High Sensitivity): 2707 ng/L (ref <18)

## 2024-07-16 LAB — LACTIC ACID, PLASMA
Lactic Acid, Venous: 0.8 mmol/L (ref 0.5–1.9)
Lactic Acid, Venous: 1.2 mmol/L (ref 0.5–1.9)

## 2024-07-16 LAB — PHOSPHORUS: Phosphorus: 2.7 mg/dL (ref 2.5–4.6)

## 2024-07-16 MED ORDER — POLYSACCHARIDE IRON COMPLEX 150 MG PO CAPS
150.0000 mg | ORAL_CAPSULE | Freq: Every day | ORAL | Status: DC
  Administered 2024-07-17 – 2024-07-23 (×7): 150 mg
  Filled 2024-07-16 (×10): qty 1

## 2024-07-16 MED ORDER — DOCUSATE SODIUM 50 MG/5ML PO LIQD
100.0000 mg | Freq: Two times a day (BID) | ORAL | Status: DC
  Administered 2024-07-16 – 2024-07-25 (×9): 100 mg
  Filled 2024-07-16 (×9): qty 10

## 2024-07-16 MED ORDER — VITAMIN D 25 MCG (1000 UNIT) PO TABS
2000.0000 [IU] | ORAL_TABLET | Freq: Every day | ORAL | Status: DC
  Administered 2024-07-17 – 2024-07-23 (×7): 2000 [IU]
  Filled 2024-07-16 (×7): qty 2

## 2024-07-16 MED ORDER — MIDAZOLAM HCL 2 MG/2ML IJ SOLN
INTRAMUSCULAR | Status: AC
  Administered 2024-07-16: 2 mg via INTRAVENOUS
  Filled 2024-07-16: qty 2

## 2024-07-16 MED ORDER — ROCURONIUM BROMIDE 10 MG/ML (PF) SYRINGE: 50.0000 mg | PREFILLED_SYRINGE | Freq: Once | INTRAVENOUS | Status: AC

## 2024-07-16 MED ORDER — SODIUM CHLORIDE 3 % IN NEBU
4.0000 mL | INHALATION_SOLUTION | Freq: Two times a day (BID) | RESPIRATORY_TRACT | Status: DC
  Filled 2024-07-16 (×2): qty 4

## 2024-07-16 MED ORDER — AMIODARONE LOAD VIA INFUSION
150.0000 mg | Freq: Once | INTRAVENOUS | Status: AC
  Administered 2024-07-16: 150 mg via INTRAVENOUS
  Filled 2024-07-16: qty 83.34

## 2024-07-16 MED ORDER — ADULT MULTIVITAMIN W/MINERALS CH
1.0000 | ORAL_TABLET | Freq: Every day | ORAL | Status: DC
  Administered 2024-07-17 – 2024-07-23 (×7): 1
  Filled 2024-07-16 (×7): qty 1

## 2024-07-16 MED ORDER — NOREPINEPHRINE 4 MG/250ML-% IV SOLN
INTRAVENOUS | Status: AC
  Administered 2024-07-16: 8 mg via INTRAVENOUS
  Filled 2024-07-16: qty 250

## 2024-07-16 MED ORDER — VITAMIN C 500 MG PO TABS
500.0000 mg | ORAL_TABLET | Freq: Every day | ORAL | Status: DC
  Administered 2024-07-17 – 2024-07-23 (×7): 500 mg
  Filled 2024-07-16 (×7): qty 1

## 2024-07-16 MED ORDER — EPINEPHRINE 1 MG/10ML IJ SOSY
PREFILLED_SYRINGE | INTRAMUSCULAR | Status: AC
  Filled 2024-07-16: qty 10

## 2024-07-16 MED ORDER — CLOPIDOGREL BISULFATE 75 MG PO TABS
75.0000 mg | ORAL_TABLET | Freq: Every day | ORAL | Status: DC
  Administered 2024-07-17 – 2024-07-23 (×7): 75 mg
  Filled 2024-07-16 (×7): qty 1

## 2024-07-16 MED ORDER — FENTANYL CITRATE PF 50 MCG/ML IJ SOSY: 25.0000 ug | PREFILLED_SYRINGE | INTRAMUSCULAR | Status: DC | PRN

## 2024-07-16 MED ORDER — KETAMINE HCL 50 MG/5ML IJ SOSY: 100.0000 mg | PREFILLED_SYRINGE | Freq: Once | INTRAMUSCULAR | Status: AC

## 2024-07-16 MED ORDER — ROCURONIUM BROMIDE 10 MG/ML (PF) SYRINGE
PREFILLED_SYRINGE | INTRAVENOUS | Status: AC
  Administered 2024-07-16: 50 mg via INTRAVENOUS
  Filled 2024-07-16: qty 10

## 2024-07-16 MED ORDER — KETAMINE HCL 50 MG/5ML IJ SOSY
PREFILLED_SYRINGE | INTRAMUSCULAR | Status: AC
  Administered 2024-07-16: 100 mg via INTRAVENOUS
  Filled 2024-07-16: qty 10

## 2024-07-16 MED ORDER — FOLIC ACID 1 MG PO TABS
1.0000 mg | ORAL_TABLET | Freq: Every day | ORAL | Status: DC
  Administered 2024-07-17 – 2024-07-23 (×7): 1 mg
  Filled 2024-07-16 (×7): qty 1

## 2024-07-16 MED ORDER — ROSUVASTATIN CALCIUM 10 MG PO TABS
20.0000 mg | ORAL_TABLET | Freq: Every day | ORAL | Status: DC
  Administered 2024-07-17 – 2024-07-23 (×7): 20 mg
  Filled 2024-07-16 (×7): qty 2

## 2024-07-16 MED ORDER — NOREPINEPHRINE 4 MG/250ML-% IV SOLN
0.0000 ug/min | INTRAVENOUS | Status: DC
  Administered 2024-07-16: 4 ug/min via INTRAVENOUS
  Administered 2024-07-16: 8 ug/min via INTRAVENOUS
  Filled 2024-07-16: qty 250

## 2024-07-16 MED ORDER — AMIODARONE HCL IN DEXTROSE 360-4.14 MG/200ML-% IV SOLN
60.0000 mg/h | INTRAVENOUS | Status: DC
  Administered 2024-07-16: 60 mg/h via INTRAVENOUS
  Filled 2024-07-16 (×2): qty 200

## 2024-07-16 MED ORDER — MIDAZOLAM HCL 2 MG/2ML IJ SOLN: 2.0000 mg | Freq: Once | INTRAMUSCULAR | Status: AC

## 2024-07-16 MED ORDER — AMIODARONE HCL IN DEXTROSE 360-4.14 MG/200ML-% IV SOLN: 30.0000 mg/h | INTRAVENOUS | Status: DC

## 2024-07-16 MED ORDER — LACTATED RINGERS IV BOLUS
500.0000 mL | Freq: Once | INTRAVENOUS | Status: AC
  Administered 2024-07-16: 500 mL via INTRAVENOUS

## 2024-07-16 MED ORDER — ACETAMINOPHEN 325 MG PO TABS
650.0000 mg | ORAL_TABLET | Freq: Four times a day (QID) | ORAL | Status: DC | PRN
  Administered 2024-07-17 – 2024-07-21 (×2): 650 mg
  Filled 2024-07-16 (×2): qty 2

## 2024-07-16 MED ORDER — POTASSIUM CHLORIDE 20 MEQ PO PACK
40.0000 meq | PACK | ORAL | Status: AC
  Administered 2024-07-16 (×2): 40 meq
  Filled 2024-07-16 (×2): qty 2

## 2024-07-16 MED ORDER — ASPIRIN 81 MG PO CHEW
81.0000 mg | CHEWABLE_TABLET | Freq: Every day | ORAL | Status: DC
  Administered 2024-07-17 – 2024-07-23 (×7): 81 mg
  Filled 2024-07-16 (×7): qty 1

## 2024-07-16 MED ORDER — LACTULOSE 10 GM/15ML PO SOLN
20.0000 g | Freq: Every day | ORAL | Status: DC | PRN
  Administered 2024-07-16: 20 g
  Filled 2024-07-16: qty 30

## 2024-07-16 MED ORDER — PROPOFOL 1000 MG/100ML IV EMUL
0.0000 ug/kg/min | INTRAVENOUS | Status: DC
  Administered 2024-07-16 (×2): 20 ug/kg/min via INTRAVENOUS
  Administered 2024-07-17: 30 ug/kg/min via INTRAVENOUS
  Administered 2024-07-17: 20 ug/kg/min via INTRAVENOUS
  Administered 2024-07-17 – 2024-07-18 (×3): 30 ug/kg/min via INTRAVENOUS
  Filled 2024-07-16 (×6): qty 100

## 2024-07-16 MED ORDER — PROPOFOL 1000 MG/100ML IV EMUL
INTRAVENOUS | Status: AC
  Filled 2024-07-16: qty 100

## 2024-07-16 MED ORDER — POTASSIUM CHLORIDE 20 MEQ PO PACK
40.0000 meq | PACK | Freq: Once | ORAL | Status: AC
  Administered 2024-07-16: 40 meq
  Filled 2024-07-16: qty 2

## 2024-07-16 MED ORDER — POLYETHYLENE GLYCOL 3350 17 G PO PACK
17.0000 g | PACK | Freq: Every day | ORAL | Status: DC
  Administered 2024-07-16 – 2024-07-23 (×3): 17 g
  Filled 2024-07-16 (×4): qty 1

## 2024-07-16 MED ORDER — PIVOT 1.5 CAL PO LIQD
1000.0000 mL | ORAL | Status: DC
  Administered 2024-07-16: 1000 mL

## 2024-07-16 NOTE — Progress Notes (Signed)
 Tube feeds held due to patient vomiting.

## 2024-07-16 NOTE — Progress Notes (Signed)
 [facility] CLINIC CARDIOLOGY PROGRESS NOTE       Patient ID: [name] MRN: [id number] DOB/AGE: 65/11/1961 65 y.o.  Admit date: 07/11/2024 Referring Physician Dr. Charlie Sellar Primary Physician Douglas Geralds, NP  Primary Cardiologist Douglas Maiden, NP  Reason for Consultation elevated troponins  HPI: [name] is a 63 y.o. 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 Found to have pneumonia, overnight troponins uptrending. Cardiology was consulted for further evaluation.   Interval history: -Patient seen and examined this AM, resting in bed. Patient sedated due to agitation in setting of alcohol  withdrawal. (Prior to event) -On NGT, Patient vomited this AM on 07/30. Aspirated. Code blue called at this time patient had pulse upon arrival. Patient requited intubation this AM on 07/30 at 1000 due to respiratory failure.Patient then reported to be pulseless and brief 2 minutes of CPR performed and 1 amp of Epi and ROSC obtained. After event per tele patient in sinus tachycardia, rate 100s.  -Patient started on levo gtt due to soft BP.  -Transitioned off Precedex  to propofol .   Review of systems complete and found to be negative unless listed above    Past Medical History:  Diagnosis Date   COPD (chronic obstructive pulmonary disease) (HCC)    CVA (cerebral infarction)    Headache    mild, since stroke 2012   Hypertension    MI (myocardial infarction) (HCC) 05/09/2012   Reading difficulty    pt reports he reads at about a second grade level   Stroke [facility]) 2012   numbness to left hand    Wears dentures    full upper and lower    Past  Surgical History:  Procedure Laterality Date   CAROTID ENDARTERECTOMY Left    2012 or 2013   CHOLECYSTECTOMY N/A 07/26/2016   Procedure: LAPAROSCOPIC CHOLECYSTECTOMY;  Surgeon: Charlie FORBES Fell, MD;  Location: [facility] ORS;  Service: General;  Laterality: N/A;   COLONOSCOPY N/A 05/08/2024   Procedure: COLONOSCOPY;  Surgeon: Jinny Carmine, MD;  Location: [facility] ENDOSCOPY;  Service: Endoscopy;  Laterality: N/A;   COLONOSCOPY WITH PROPOFOL  N/A 02/09/2022   Procedure: COLONOSCOPY WITH PROPOFOL ;  Surgeon: Jinny Carmine, MD;  Location: [facility] ENDOSCOPY;  Service: Endoscopy;  Laterality: N/A;   CORONARY ARTERY BYPASS GRAFT  05/10/2012   3 vessel   CORONARY STENT INTERVENTION N/A 07/02/2024   Procedure: CORONARY STENT INTERVENTION;  Surgeon: Katina Albright, MD;  Location: [facility] INVASIVE CV LAB;  Service: Cardiovascular;  Laterality: N/A;   CORONARY ULTRASOUND/IVUS N/A 07/02/2024   Procedure: Coronary Ultrasound/IVUS;  Surgeon: Katina Albright, MD;  Location: [facility] INVASIVE CV LAB;  Service: Cardiovascular;  Laterality: N/A;   ENTEROSCOPY N/A 05/16/2023   Procedure: ENTEROSCOPY;  Surgeon: Therisa Bi, MD;  Location: [facility] ENDOSCOPY;  Service: Gastroenterology;  Laterality: N/A;   ENTEROSCOPY N/A 06/01/2023   Procedure: ENTEROSCOPY;  Surgeon: Unk Corinn Skiff, MD;  Location: [facility] ENDOSCOPY;  Service: Gastroenterology;  Laterality: N/A;   ESOPHAGOGASTRODUODENOSCOPY  05/13/2023   Procedure: ESOPHAGOGASTRODUODENOSCOPY (EGD);  Surgeon: Jinny Carmine, MD;  Location: [facility] ENDOSCOPY;  Service: Endoscopy;;   ESOPHAGOGASTRODUODENOSCOPY (EGD) WITH PROPOFOL  N/A 10/28/2022   Procedure: ESOPHAGOGASTRODUODENOSCOPY (EGD) WITH PROPOFOL ;  Surgeon: Onita Elspeth Sharper, DO;  Location: [facility] ENDOSCOPY;  Service: Gastroenterology;  Laterality: N/A;   GIVENS CAPSULE STUDY  05/13/2023   Procedure: GIVENS CAPSULE STUDY;  Surgeon: Jinny Carmine, MD;  Location: [facility] ENDOSCOPY;  Service: Endoscopy;;   GIVENS CAPSULE STUDY  06/01/2023   Procedure: GIVENS  CAPSULE STUDY;  Surgeon: Unk Corinn Skiff, MD;  Location: [facility] ENDOSCOPY;  Service: Gastroenterology;;   LEFT HEART CATH AND CORS/GRAFTS ANGIOGRAPHY N/A 07/02/2024   Procedure: LEFT HEART CATH AND CORS/GRAFTS ANGIOGRAPHY;  Surgeon: Katina Albright, MD;  Location: [facility] INVASIVE CV LAB;  Service: Cardiovascular;  Laterality: N/A;    Medications Prior to Admission  Medication Sig Dispense Refill Last Dose/Taking   albuterol  (VENTOLIN  HFA) 108 (90 Base) MCG/ACT inhaler Inhale 2 puffs into the lungs every 6 (six) hours as needed for wheezing or shortness of breath. 6.7 g 2 07/11/2024 Morning   ascorbic acid  (VITAMIN C ) 500 MG tablet Take 1 tablet (500 mg total) by mouth daily. 30 tablet 2 07/11/2024 Morning   aspirin  EC 81 MG tablet Take 1 tablet (81 mg total) by mouth daily. Swallow whole. 30 tablet 5 07/11/2024 Morning   clopidogrel  (PLAVIX ) 75 MG tablet Take 1 tablet (75 mg total) by mouth daily with breakfast. 30 tablet 11 07/11/2024 Morning   dapagliflozin  propanediol (FARXIGA ) 10 MG TABS tablet Take 1 tablet (10 mg total) by mouth daily. 30 tablet 11 07/11/2024 Morning   folic acid  (FOLVITE ) 1 MG tablet Take 1 tablet (1 mg total) by mouth daily. 30 tablet 2 07/11/2024   iron  polysaccharides (NIFEREX) 150 MG capsule Take 1 capsule (150 mg total) by mouth daily. 30 capsule 2 07/11/2024   isosorbide  mononitrate (IMDUR ) 60 MG 24 hr tablet Take 1 tablet (60 mg total) by mouth daily. 30 tablet 11 07/11/2024   losartan  (COZAAR ) 25 MG tablet Take 0.5 tablets (12.5 mg total) by mouth daily. 30 tablet 11 07/11/2024 Morning   metoprolol  succinate (TOPROL -XL) 25 MG 24 hr tablet Take 0.5 tablets (12.5 mg total) by mouth daily. 30 tablet 11 07/11/2024 Morning   pantoprazole  (PROTONIX ) 40 MG tablet Take 1 tablet (40 mg total) by mouth daily. 30 tablet 2 07/11/2024 Morning   pravastatin  (PRAVACHOL ) 20 MG tablet Take 1 tablet (20 mg total) by mouth at bedtime. 30 tablet 11 07/10/2024 Evening   spironolactone  (ALDACTONE ) 25 MG  tablet Take 0.5 tablets (12.5 mg total) by mouth daily. 30 tablet 11 07/11/2024 Morning   torsemide  (DEMADEX ) 10 MG tablet Take 1 tablet (10 mg total) by mouth daily. 30 tablet 11 07/11/2024   Social History   Socioeconomic History   Marital status: Divorced    Spouse name: Not on file   Number of children: Not on file   Years of education: Not on file   Highest education level: Not on file  Occupational History   Not on file  Tobacco Use   Smoking status: Former    Current packs/day: 0.00    Types: Cigarettes    Quit date: 14    Years since quitting: 32.5   Smokeless tobacco: Current    Types: Snuff   Tobacco comments:    changed to dip  Vaping Use   Vaping status: Never Used  Substance and Sexual Activity   Alcohol  use: Yes    Alcohol /week: 26.0 standard drinks of alcohol     Types: 26 Cans of beer per week   Drug use: No   Sexual activity: Yes    Birth control/protection: None  Other Topics Concern   Not on file  Social History Narrative   Not on file   Social Drivers of Health   Financial Resource Strain: Not on file  Food Insecurity: No Food Insecurity (07/12/2024)   Hunger Vital Sign    Worried About Running Out of Food in the Last Year: Never true    Ran Out of Food in the Last Year: Never true  Transportation Needs: No Transportation Needs (07/12/2024)   PRAPARE - Administrator, Civil Service (Medical): No    Lack of Transportation (Non-Medical): No  Physical Activity: Not on file  Stress: Not on file  Social Connections: Socially Isolated (07/12/2024)   Social Connection and Isolation Panel    Frequency of Communication with Friends and Family: Once a week    Frequency of Social Gatherings with Friends and Family: Once a week    Attends Religious Services: Never    Database administrator or Organizations: No    Attends Banker Meetings: Never    Marital Status: Divorced  Catering manager Violence: Not At Risk (07/12/2024)    Humiliation, Afraid, Rape, and Kick questionnaire    Fear of Current or Ex-Partner: No    Emotionally Abused: No    Physically Abused: No    Sexually Abused: No    Family History  Problem Relation Age of Onset   Pneumonia Mother      Vitals:   07/16/24 1045 07/16/24 1100 07/16/24 1115 07/16/24 1158  BP: 126/86 121/71 (!) 87/51   Pulse:  (!) 120 (!) 56   Resp: 10 (!) 23 20   Temp:      TempSrc:      SpO2:  100% 96% 100%  Weight:      Height:        PHYSICAL EXAM General: Ill appearing male, intubated,  sedated HEENT: Normocephalic and atraumatic. Neck: No JVD.  Lungs: Normal respiratory effort on 1L [place]. Diminished breath sounds bilaterally. Heart: HRRR, elevated rate. Normal S1 and S2 without gallops. +systolic ejection murmur.   Abdomen: Non-distended appearing.  Msk: Normal strength and tone for age. Extremities: Warm and well perfused. No clubbing, cyanosis, edema.   Labs: Basic Metabolic Panel: Recent Labs    07/15/24 0307 07/16/24 0334  NA 139 141  K 3.7 3.2*  CL 102 104  CO2 28 28  GLUCOSE 117* 129*  BUN 25* 29*  CREATININE 0.92 0.99  CALCIUM  8.2* 8.1*  MG 2.4 2.6*  PHOS 2.7 2.7   Liver Function Tests: Recent Labs    07/14/24 0535  AST 41  ALT 18  ALKPHOS 31*  BILITOT 1.3*  PROT 6.6  ALBUMIN 3.2*   No results for input(s): LIPASE, AMYLASE in the last 72 hours. CBC: Recent Labs    07/15/24 0307 07/16/24 0334  WBC 12.4* 12.6*  HGB 8.6* 8.6*  HCT 27.8* 28.9*  MCV 86.9 88.9  PLT 227 234   Cardiac Enzymes: No results for input(s): CKTOTAL, CKMB, CKMBINDEX, TROPONINIHS in the last 72 hours.  BNP: No results for input(s): BNP in the last 72 hours.  D-Dimer: No results for input(s): DDIMER in the last 72 hours. Hemoglobin A1C: No results for input(s): HGBA1C in the last 72 hours. Fasting Lipid Panel: No results for input(s): CHOL, HDL, LDLCALC, TRIG, CHOLHDL, LDLDIRECT in the last 72 hours. Thyroid  Function Tests: No results for input(s): TSH, T4TOTAL, T3FREE, THYROIDAB in the last 72 hours.  Invalid input(s): FREET3 Anemia Panel: No results for input(s): VITAMINB12, FOLATE, FERRITIN, TIBC, IRON , RETICCTPCT in the  last 72 hours.   Radiology: [facility] Chest Port 1 View Result Date: 07/16/2024 CLINICAL DATA:  Endotracheal tube and central line placement. EXAM: PORTABLE CHEST 1 VIEW COMPARISON:  07/16/2024 at 8:58 a.m. FINDINGS: Patient is rotated to the right. Endotracheal tube has tip proximally 13.5 cm above the carina. This could be advanced approximately 6 cm. Left IJ central venous catheter has tip over the SVC. Enteric tube courses into the region of the stomach and off the image as tip is not visualized. Lungs are adequately inflated with possible mild hazy opacification over the right upper lung abutting the minor fissure which may be due to atelectasis or infection. Left lung is clear. Cardiomediastinal silhouette and remainder of the exam is unchanged. IMPRESSION: 1. Endotracheal tube has tip proximally 13.5 cm above the carina. This could be advanced approximately 6 cm. 2. Left IJ central venous catheter with tip over the SVC. 3. Possible mild hazy opacification over the right upper lung abutting the minor fissure which may be due to atelectasis or infection. Electronically Signed   By: Toribio Agreste M.D.   On: 07/16/2024 10:53   DG Chest Port 1 View Result Date: 07/16/2024 CLINICAL DATA:  [id number] Aspiration into airway [id number]. EXAM: PORTABLE CHEST 1 VIEW COMPARISON:  06/23/2024. FINDINGS: Bilateral lungs appear hyperlucent with coarse bronchovascular markings, in keeping with COPD. Bilateral lungs otherwise appear clear. No dense consolidation or lung collapse. Bilateral costophrenic angles are clear. Stable cardio-mediastinal silhouette. There are surgical staples along the heart border and sternotomy wires, status post CABG (coronary artery bypass graft). No acute  osseous abnormalities. The soft tissues are within normal limits. Enteric tube is seen coursing below the left hemidiaphragm however, the tip is not included on the film. IMPRESSION: No active disease. COPD. Electronically Signed   By: Ree Molt M.D.   On: 07/16/2024 09:09   DG Abd 1 View Result Date: 07/16/2024 CLINICAL DATA:  Vomiting.  NG tube placement. EXAM: ABDOMEN - 1 VIEW COMPARISON:  07/15/2024 FINDINGS: Nasogastric tube has tip and side-port over the stomach in the upper abdomen just left of midline. Visualized lower thorax unchanged. Abdominopelvic images demonstrate a non obstructive bowel gas pattern. Mild fecal retention throughout the colon. Surgical clips over the right upper quadrant. There are degenerative changes of the spine and hips. Calcified plaque over the abdominal aorta and iliac arteries. IMPRESSION: 1. Nasogastric tube with tip and side-port over the stomach. 2. Nonobstructive bowel gas pattern. Mild fecal retention. 3. Aortic atherosclerosis. Electronically Signed   By: Toribio Agreste M.D.   On: 07/16/2024 07:51   DG Abd 1 View Result Date: 07/15/2024 CLINICAL DATA:  Orogastric tube placement. EXAM: ABDOMEN - 1 VIEW COMPARISON:  June 29, 2023. FINDINGS: Distal tip of nasogastric tube is seen in expected position of proximal stomach. IMPRESSION: Nasogastric tube tip seen in expected position of proximal stomach. Electronically Signed   By: Lynwood Landy Raddle M.D.   On: 07/15/2024 10:25   DG Chest Port 1 View Result Date: 07/13/2024 CLINICAL DATA:  Respiratory failure with hypoxia. EXAM: PORTABLE CHEST 1 VIEW COMPARISON:  Multiple previous chest x-rays and recent chest CT. FINDINGS: Stable surgical changes from bypass surgery. The cardiac silhouette, mediastinal and hilar contours are within normal limits and stable. Underlying emphysematous changes and pulmonary scarring. No pleural effusions or discrete pulmonary infiltrates. No pneumothorax. IMPRESSION: Emphysematous changes  and pulmonary scarring but no acute overlying pulmonary process. Electronically Signed   By: MYRTIS Stammer M.D.   On: 07/13/2024 16:08  US  Abdomen Limited RUQ (LIVER/GB) Result Date: 07/13/2024 CLINICAL DATA:  Portal hypertension. EXAM: ULTRASOUND ABDOMEN LIMITED RIGHT UPPER QUADRANT COMPARISON:  Abdominal ultrasound 10/19/2022 FINDINGS: Gallbladder: Surgically absent. Common bile duct: Diameter: 3.3 mm Liver: No focal lesion identified. Parenchymal echogenicity is increased and diffusely heterogeneous. There is lobular liver contour. Portal vein is patent on color Doppler imaging with normal direction of blood flow towards the liver. Other: None. IMPRESSION: Lobular liver contour with increased echogenicity and heterogeneous parenchyma consistent with cirrhosis. No focal liver lesion identified. Electronically Signed   By: Greig Pique M.D.   On: 07/13/2024 16:03   DG Chest Port 1 View Result Date: 07/12/2024 CLINICAL DATA:  Shortness of breath. EXAM: PORTABLE CHEST 1 VIEW COMPARISON:  07/11/2024 FINDINGS: The cardio pericardial silhouette is enlarged. There is pulmonary vascular congestion without overt pulmonary edema. Trace bilateral pleural effusions. Old posterior left sixth rib fracture. Telemetry leads overlie the chest. IMPRESSION: Enlargement of the cardiopericardial silhouette with new pulmonary vascular congestion and trace bilateral pleural effusions. Electronically Signed   By: Camellia Candle M.D.   On: 07/12/2024 12:11   CT Angio Chest PE W/Cm &/Or Wo Cm Result Date: 07/11/2024 CLINICAL DATA:  High probability for PE. Chest pain with shortness of breath EXAM: CT ANGIOGRAPHY CHEST WITH CONTRAST TECHNIQUE: Multidetector CT imaging of the chest was performed using the standard protocol during bolus administration of intravenous contrast. Multiplanar CT image reconstructions and MIPs were obtained to evaluate the vascular anatomy. RADIATION DOSE REDUCTION: This exam was performed according to  the departmental dose-optimization program which includes automated exposure control, adjustment of the mA and/or kV according to patient size and/or use of iterative reconstruction technique. CONTRAST:  75mL OMNIPAQUE  IOHEXOL  350 MG/ML SOLN COMPARISON:  CT angiogram chest 05/09/2024 FINDINGS: Cardiovascular: Satisfactory opacification of the pulmonary arteries to the segmental level. No evidence of pulmonary embolism. Patient is status post cardiac surgery. The heart is enlarged. There is no pericardial effusion. There are atherosclerotic calcifications of the aorta and coronary arteries. Aorta is normal in size. Mediastinum/Nodes: There are enlarged right hilar lymph nodes measuring up to 11 mm. Enlarged paratracheal lymph nodes measure up to 10 mm. Visualized thyroid gland and esophagus are within normal limits. Lungs/Pleura: Moderate emphysema again seen. There is stable scarring in the right lung apex. There is new patchy slightly nodular airspace disease in the right upper lobe measuring 2.0 by 1.1 cm. There is right-sided central peribronchial wall thickening. Previously identified airspace disease in the left lower lobe has resolved. There is no pleural effusion or pneumothorax. There is a stable left lower lobe pulmonary nodule image 6/57. Upper Abdomen: No acute abnormality. Musculoskeletal: No chest wall abnormality. No acute or significant osseous findings. Review of the MIP images confirms the above findings. IMPRESSION: 1. No evidence for pulmonary embolism. 2. New patchy slightly nodular airspace disease in the right upper lobe worrisome for pneumonia. Follow-up CT recommended in 4-6 weeks recommended to ensure resolution and to exclude underlying nodule. 3. Right-sided central peribronchial wall thickening worrisome for bronchitis. 4. Right hilar and mediastinal lymphadenopathy, likely reactive. 5. Stable left lower lobe pulmonary nodule. 6. Cardiomegaly. Aortic Atherosclerosis (ICD10-I70.0) and  Emphysema (ICD10-J43.9). Electronically Signed   By: Greig Pique M.D.   On: 07/11/2024 18:54   DG Chest Portable 1 View Result Date: 07/11/2024 CLINICAL DATA:  SOB EXAM: PORTABLE CHEST - 1 VIEW COMPARISON:  June 24, 2024 FINDINGS: Biapical pleural thickening. Sternotomy wires and CABG markers. No focal airspace consolidation, pleural effusion, or pneumothorax. Mild cardiomegaly.  Tortuous aorta with aortic atherosclerosis. No acute fracture or destructive lesions. Multilevel thoracic osteophytosis. IMPRESSION: No acute cardiopulmonary abnormality. Electronically Signed   By: Rogelia Myers M.D.   On: 07/11/2024 17:25   CARDIAC CATHETERIZATION Result Date: 07/02/2024   Prox LAD to Mid LAD lesion is 90% stenosed.   Ost LM to Mid LM lesion is 50% stenosed.   Ost Cx to Prox Cx lesion is 90% stenosed.   Ost RCA to Prox RCA lesion is 100% stenosed.   Mid Graft to Dist Graft lesion is 95% stenosed.   Recommend uninterrupted dual antiplatelet therapy with Aspirin  81mg  daily and Clopidogrel  75mg  daily for a minimum of 6 months (stable ischemic heart disease-Class I recommendation). 1.  Severe native three-vessel CAD 2.  LIMA to LAD and SVG to OM widely patent 3.  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  Successful direct stenting of the distal SVG to PDA with distal protection with a 4.0 x 15 mm drug-eluting stent with intravascular ultrasound guidance 5.  Aspirin  and clopidogrel  for at least 6 months followed by clopidogrel  indefinitely 6.  Continue workup for aortic valve replacement   DG Chest 2 View Result Date: 06/24/2024 CLINICAL DATA:  sob EXAM: CHEST - 2 VIEW COMPARISON:  June 03, 2024 FINDINGS: Biapical pleural thickening. No focal airspace consolidation, pleural effusion, or pneumothorax. No cardiomegaly. Sternotomy wires and CABG markers. Tortuous aorta with aortic atherosclerosis. No acute fracture or destructive lesions. Multilevel thoracic osteophytosis. Osteopenia. IMPRESSION: No acute  cardiopulmonary abnormality. Electronically Signed   By: Rogelia Myers M.D.   On: 06/24/2024 11:54    ECHO 04/2024: 1. Left ventricular ejection fraction, by estimation, is 60 to 65%. The left ventricle has normal function. The left ventricle has no regional wall motion abnormalities. The left ventricular internal cavity size was mildly dilated. Left ventricular diastolic parameters are consistent with Grade I diastolic dysfunction (impaired relaxation).   2. Right ventricular systolic function is normal. The right ventricular  size is normal.   3. The mitral valve is normal in structure. No evidence of mitral valve  regurgitation.   4. The aortic valve is normal in structure. Aortic valve regurgitation is  trivial. Severe aortic valve stenosis.   TELEMETRY reviewed by me 07/16/2024: sinus tachycardia, rate 100s  EKG reviewed by me: sinus tachycardia ST depression, rate 129 bpm  Data reviewed by me 07/16/2024: last 24h vitals tele labs imaging I/O hospitalist progress note, PCCM notes  Principal Problem:   Acute respiratory distress Active Problems:   Hyperlipidemia   Cognitive impairment   Alcohol  abuse   Essential hypertension   CAD (coronary artery disease)   Stroke (HCC)   Iron  deficiency anemia   Acute on chronic diastolic CHF (congestive heart failure) (HCC)   COPD exacerbation (HCC)   Elevated lactic acid level   Lobar pneumonia (HCC)   Metabolic acidosis, increased anion gap   Myocardial injury    ASSESSMENT AND PLAN:  Jerret Mcbane is a 63 y.o. 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 Found to have pneumonia, overnight troponins  uptrending. Cardiology was consulted for further evaluation.   # Acute hypoxic respiratory failure,  mechanical ventilation # Aspiration pneumonia # Alcohol  withdrawal # Acute on chronic HFpEF (flash pulmonary edema) # Severe aortic stenosis Presented with SOB, cough, imaging concerning for HCAP/COPD exacerbation. LA elevated on presentation at 3.6 > 5.9> 3.4. Patient intubated and on levo gtt.  -IV lasix  discontinued per primary team. Avoid aggressive diuresis in setting of severe AS.   -Hold home dapagliflozin  10 mg daily until BP improves. -Will plan to optimize GDMT when patient hemodynamically stable. -Further management of respiratory failure/ aspiration PNA as per primary team.  -CIWA protocol given hx of alcohol  abuse. -Scheduled to see Dr. Katina 7/30 to discuss aortic valve repair/replacement options. Will reschedule this appointment when patient stabilizes.    # NSTEMI # Coronary artery disease s/p CABG x3 (04/2012) # Hypertension # Hyperlipidemia With recent LHC, s/p DES to SVG to PDA 07/02/2024. Reported some CP when he first came to hospital which has now resolved, no recurrence. Troponin trended 44 > 144 > 205 > 980 > 1862 > 5766 > 5756.  -Continue to monitor telemetry closely.  -Continue home plavix  75 mg daily. Continue aspirin  81 mg daily. DAPT given recent DES. -Started on heparin  gtt by [facility] 7/26, Low Hgb. Completed 24 hours of heparin .  -Continue Crestor  20 mg daily for high-intensity statin management.  -Hold home metoprolol  succinate until BP improves and off pressor.  -Home losartan , Imdur  held due to soft BP. -Continue levo infusion, wean when able. Managed by critical care team.  -Recommend MAP > 65.   # Acute on chronic anemia # Hx AVM Patient received blood transfusion on 07/28 with improving Hgb.  -Continue PPIs, GI consulted and following. -Goal Hgb >8 due to cardiac history. Recommend transfusion. -Patient not on aspirin  historically due to history of  multiple small bowel AVMs that have been intermittently bleeding for years.  Did the patient have an acute coronary syndrome (MI, NSTEMI, STEMI, etc) this admission?:  Yes                               AHA/ACC ACS Clinical Performance & Quality Measures: Aspirin  prescribed? - Yes ADP Receptor Inhibitor (Plavix /Clopidogrel , Brilinta/Ticagrelor or Effient/Prasugrel) prescribed (includes medically managed patients)? - Yes Beta Blocker prescribed? - Yes High Intensity Statin (Lipitor 40-80mg  or Crestor  20-40mg ) prescribed? - Yes EF assessed during THIS hospitalization? - No - patient with recent echo performed on 05/10/2024.  For EF <40%, was ACEI/ARB prescribed? - Not Applicable (EF >/= 40%) For EF <40%, Aldosterone Antagonist (Spironolactone  or Eplerenone) prescribed? - Not Applicable (EF >/= 40%) Cardiac Rehab Phase II ordered (including medically managed patients)? - Yes, Patient with recent outpatient LHC performed on 07/16, referred to cardiac rehab during that time on 07/04/24.     This patient's plan of care was discussed and created with Dr. Wilburn and he is in agreement.  Signed: Dorene Comfort, PA-C  07/16/2024, 12:19 PM [facility]

## 2024-07-16 NOTE — Plan of Care (Signed)
  Problem: Clinical Measurements: Goal: Will remain free from infection Outcome: Progressing Goal: Respiratory complications will improve Outcome: Progressing   Problem: Nutrition: Goal: Adequate nutrition will be maintained Outcome: Progressing   Problem: Pain Managment: Goal: General experience of comfort will improve and/or be controlled Outcome: Progressing   Problem: Safety: Goal: Ability to remain free from injury will improve Outcome: Progressing   Problem: Skin Integrity: Goal: Risk for impaired skin integrity will decrease Outcome: Progressing

## 2024-07-16 NOTE — Significant Event (Signed)
 1001: responded to code blue overhead page. On arrival to unit, pt had pulse. Was reported pt had been pulseless, intubated prior to code event. 1002 1 mg epi given flush to follow. Brief CPR. ROSC. Dr. Malka, Inge Lecher, pt RN, other staff members, Respiratory Therapists all present.

## 2024-07-16 NOTE — Procedures (Signed)
 Central Venous Catheter Insertion Procedure Note  [name]  [id number]  June 16, 1961  Date:07/16/24  Time:10:34 AM   Provider Performing:Kerby Hockley D Shellia   Procedure: Insertion of Non-tunneled Central Venous Catheter(36556) with US  guidance (23062)   Indication(s) Medication administration and Difficult access  Consent Unable to obtain consent due to emergent nature of procedure.  Anesthesia Topical only with 1% lidocaine    Timeout Verified patient identification, verified procedure, site/side was marked, verified correct patient position, special equipment/implants available, medications/allergies/relevant history reviewed, required imaging and test results available.  Sterile Technique Maximal sterile technique including full sterile barrier drape, hand hygiene, sterile gown, sterile gloves, mask, hair covering, sterile ultrasound probe cover (if used).  Procedure Description Area of catheter insertion was cleaned with chlorhexidine  and draped in sterile fashion.  With real-time ultrasound guidance a central venous catheter was placed into the left internal jugular vein. Nonpulsatile blood flow and easy flushing noted in all ports.  The catheter was sutured in place and sterile dressing applied.  Complications/Tolerance None; patient tolerated the procedure well. Chest X-ray is ordered to verify placement for internal jugular or subclavian cannulation.   Chest x-ray is not ordered for femoral cannulation.  EBL Minimal  Specimen(s) None   Line inserted to the 20 cm mark.  [name], AGACNP-BC [facility] Prefer epic messenger for cross cover needs If after hours, please call E-link

## 2024-07-16 NOTE — Progress Notes (Signed)
 Paged cardiology due to non-sustained SVT (175bpm). Once the SVT broke, went back into NSR. Flipping in and out or ventricular bigeminy. Patient is currently on Amiodarone  @ 60mg /hr and to be decreased per order @ 0015.

## 2024-07-16 NOTE — TOC Progression Note (Addendum)
 Transition of Care [facility]) - Progression Note    Patient Details  Name: [name] MRN: [id number] Date of Birth: 1961-12-04  [facility] CM/SW Contact  [name], LCSW Phone Number: 07/16/2024, 10:29 AM  Clinical Narrative:  Left message for Mliss Candy at [facility]. Will see if she has any family contacts when she calls back.   1:23 pm: Received call back from Miguel Barrera. 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 She now works from home. She does not want to be considered an emergency contact. CSW removed her from the list. Mliss confirmed patient has a daughter and son but they are estranged.  Expected Discharge Plan and Services                                               Social Drivers of Health (SDOH) Interventions SDOH Screenings   Food Insecurity: No Food Insecurity (07/12/2024)  Housing: Low Risk  (07/12/2024)  Transportation Needs: No Transportation Needs (07/12/2024)  Utilities: Not At Risk (07/12/2024)  Social Connections: Socially Isolated (07/12/2024)  Tobacco Use: High Risk (07/11/2024)    Readmission Risk Interventions    05/14/2024    8:44 AM 10/27/2022   11:28 AM  Readmission Risk Prevention Plan  Transportation Screening  Complete  PCP or Specialist Appt within 5-7 Days  Complete  PCP or Specialist Appt within 3-5 Days Complete   Home Care Screening  Complete  Medication Review (RN CM)  Complete  HRI or Home Care Consult Complete   Social Work Consult for Recovery Care Planning/Counseling Complete   Palliative Care Screening Not Applicable   Medication Review Oceanographer) Complete

## 2024-07-16 NOTE — Procedures (Signed)
 Intubation Procedure Note  Keaundre Thelin  [id number]  August 24, 1961  Date:07/16/24  Time:9:58 AM   Provider Performing:Alizia Greif D Shellia    Procedure: Intubation (31500)  Indication(s) Respiratory Failure  Consent Unable to obtain consent due to emergent nature of procedure.   Anesthesia Versed , Rocuronium , and Ketamine    Time Out Verified patient identification, verified procedure, site/side was marked, verified correct patient position, special equipment/implants available, medications/allergies/relevant history reviewed, required imaging and test results available.   Sterile Technique Usual hand hygeine, masks, and gloves were used   Procedure Description Patient positioned in bed supine.  Sedation given as noted above.  Patient was intubated with endotracheal tube using Glidescope.  View was Grade 1 full glottis .  Number of attempts was 1.  Colorimetric CO2 detector was consistent with tracheal placement.   Complications/Tolerance None; patient tolerated the procedure well. Chest X-ray is ordered to verify placement.   EBL Minimal   Specimen(s) None   Size 8.0 ETT Tube secured at 23 cm at the lip   [name], AGACNP-BC [facility] Prefer epic messenger for cross cover needs If after hours, please call E-link

## 2024-07-16 NOTE — Progress Notes (Signed)
 Nutrition Follow Up Note   DOCUMENTATION CODES:   Not applicable  INTERVENTION:   Pivot 1.5@60ml /hr- Initiate at 65ml/hr, once tolerating, increase by 10ml/hr q 8 hours until goal rate is reached.   Free water  flushes 30ml q4 hours to maintain tube patency   Regimen provides 2160kcal/day, 135g/day protein and 1221ml/day of free water .   Pt at high refeed risk; recommend monitor potassium, magnesium  and phosphorus labs daily until stable  Continue MVI, thiamine  and folic acid  daily   Cholecalciferol  2,000 units daily via tube   Daily weights   NUTRITION DIAGNOSIS:   Inadequate oral intake related to lethargy/confusion as evidenced by NPO status. -ongoing   GOAL:   Provide needs based on ASPEN/SCCM guidelines -not met   MONITOR:   Vent status, Labs, Weight trends, Skin, I & O's, TF tolerance  ASSESSMENT:   63 y/o male with h/o HLD, etoh abuse, HTN, CAD s/p CABG x 3 (2013), severe aortic stenosis, CHF, CVA, IDA, COPD, MI, GERD, hiatal hernia, CKD III, intestional AVM, depression and recent admission for elective left heart catheterization s/p left heart cath and stent placement 7/16 and who is now admitted with NSTEMI, CHF/COPD exacerbation, suspected aspiration and etoh withdrawal.  Pt with vomiting and suspected aspiration event this morning complicated by a brief cardiac arrest during intubation. Pt remains intubated and ventilated. NGT remains in place. KUB within normal limits. Plan is to resume trickle tube feeds today; will advance as tolerated. Pt remains at refeed risk. No BM since 7/27; mild fecal retention noted on KUB. Per chart, pt is up ~4lbs since admission. Pt -2.6L on his I & Os.   Medications reviewed and include: vitamin C , D3, plavix , colace, lovenox , folic acid , iron , MVI, protonix , miralax , thiamine , levophed , propofol   Labs reviewed: K 3.2(L), BUN 29(H), P 2.7 wnl, Mg 2.6(H) Folate 7.0 wnl, B12 937(H), vitamin D  12.02(L)- 5/21 WBC- 12.6(H), Hgb  8.6(L), Hct 28.9(L) Cbgs- 83, 100, 126 x 24 hrs   Patient is currently intubated on ventilator support MV: 10.8 L/min Temp (24hrs), Avg:97.9 F (36.6 C), Min:97.1 F (36.2 C), Max:99.2 F (37.3 C)  Propofol : 11.74ml/hr- provides 307kcal/day   MAP >43mmHg  UOP-   Diet Order:   Diet Order             Diet NPO time specified  Diet effective now                  EDUCATION NEEDS:   No education needs have been identified at this time  Skin:  Skin Assessment: Reviewed RN Assessment (ecchymosis)  Last BM:  7/27- per chart  Height:   Ht Readings from Last 1 Encounters:  07/11/24 6' (1.829 m)    Weight:   Wt Readings from Last 1 Encounters:  07/16/24 97 kg    Ideal Body Weight:  80.9 kg  BMI:  Body mass index is 29 kg/m.  Estimated Nutritional Needs:   Kcal:  2077kcal/day  Protein:  125-140g/day  Fluid:  2.0L/day  Augustin Shams MS, RD, LDN If unable to be reached, please send secure chat to RD inpatient available from 8:00a-4:00p daily

## 2024-07-16 NOTE — Progress Notes (Signed)
  Chaplain On-Call responded to Code Blue notification at 1004 hours.  Nursing Staff reported that the Code happened after the patient was intubated.  Medical procedures are underway at the bedside to  support the patient.  Chaplain assured Staff of availability as needed.  Chaplain [name]., [facility]

## 2024-07-16 NOTE — Progress Notes (Signed)
 NAME:  [name], MRN:  [id number], DOB:  03-14-1961, LOS: 5 ADMISSION DATE:  07/11/2024, CONSULTATION DATE: 07/12/24  Brief Pt Description / Synopsis:  63 y.o. male with PMHx significant for ETOH abuse, HFrEF, CAD with recent PCI, COPD, and GI Bleed initially admitted with Acute Hypoxic Respiratory Failure due to HCAP and COPD Exacerbation.  Hospital course complicated by Acute Metabolic Encephalopathy due to severe Alcohol  Withdrawal and DT's with acute aspiration event requiring intubation for airway protection.   History of Present Illness:  Patient is a 63 year old male who was admitted to the hospital on 7/25 after presenting with shortness of breath and chest discomfort.   He has a known history of severe aortic stenosis as well as coronary artery disease.  He is previously had CABG as well as PCI.  He also has underlying COPD, HFpEF, alcohol  use disorder, and GI bleed.   Patient was recently in the hospital on 7/16 - 7/17 for left heart cath with stenting to bypass graft (SVG to PDA).  He developed shortness of breath with cough productive of sputum over the past few days prompting presentation to the ED yesterday.   On arrival to the ED, his lower extremities were swollen and he was found to have a wheeze.  He was hypoxic requiring oxygen support via nasal cannula.  Given respiratory distress, he was started on treatment for COPD exacerbation and admitted to the hospitalist service for further management.  CT scan of the chest was negative for PE but did note some patchy nodular airspace disease as well as airway thickening.  He was started on broad-spectrum IV antibiotics, steroids, bronchodilators, and received IV fluids for sepsis given elevated lactic acid.   This morning, he had increased shortness of breath with respiratory distress prompting initiation of BiPAP and transferred to the ICU.  He was also started on dexmedetomidine  for agitation.   Blood work showed normal kidney  function, but with rising troponin and a significantly elevated BNP. Hemoglobin has been stable.   Pertinent  Medical History  CAD s/p CABG HFpEF Severe Aortic Stenosis ETOH use COPD on Trelegy  Micro Data:  07/25: COVID>>negative  07/25: Influenza A&B>>negative  07/25: RSV>>negative  07/25: MRSA PCR>>negative  07/25: Blood>>staph epidermidis in aerobic bottle only (likely a contaminant)  07/26: RVP>>negative  7/30: Tracheal aspirate>>  Anti-infectives (From admission, onward)    Start     Dose/Rate Route Frequency Ordered Stop   07/14/24 1200  cefTRIAXone  (ROCEPHIN ) 2 g in sodium chloride  0.9 % 100 mL IVPB        2 g 200 mL/hr over 30 Minutes Intravenous Every 24 hours 07/14/24 1030 07/17/24 1159   07/12/24 1600  piperacillin -tazobactam (ZOSYN ) IVPB 3.375 g  Status:  Discontinued        3.375 g 12.5 mL/hr over 240 Minutes Intravenous Every 8 hours 07/12/24 1503 07/14/24 1030   07/12/24 0900  vancomycin  (VANCOREADY) IVPB 1250 mg/250 mL  Status:  Discontinued        1,250 mg 166.7 mL/hr over 90 Minutes Intravenous Every 12 hours 07/11/24 2012 07/12/24 1458   07/11/24 2100  ceFEPIme  (MAXIPIME ) 2 g in sodium chloride  0.9 % 100 mL IVPB  Status:  Discontinued        2 g 200 mL/hr over 30 Minutes Intravenous Every 8 hours 07/11/24 1959 07/12/24 1458   07/11/24 2100  vancomycin  (VANCOREADY) IVPB 2000 mg/400 mL        2,000 mg 200 mL/hr over 120 Minutes Intravenous  Once 07/11/24  2004 07/11/24 2255   07/11/24 1945  cefTRIAXone  (ROCEPHIN ) 1 g in sodium chloride  0.9 % 100 mL IVPB  Status:  Discontinued        1 g 200 mL/hr over 30 Minutes Intravenous  Once 07/11/24 1930 07/11/24 1933   07/11/24 1945  azithromycin  (ZITHROMAX ) 500 mg in sodium chloride  0.9 % 250 mL IVPB  Status:  Discontinued        500 mg 250 mL/hr over 60 Minutes Intravenous  Once 07/11/24 1930 07/11/24 1955   07/11/24 1945  cefTRIAXone  (ROCEPHIN ) 2 g in sodium chloride  0.9 % 100 mL IVPB  Status:  Discontinued         2 g 200 mL/hr over 30 Minutes Intravenous  Once 07/11/24 1933 07/11/24 1955      Significant Hospital Events: Including procedures, antibiotic start and stop dates in addition to other pertinent events   7/25: admit with respiratory failure 7/26: clinically decompensated, worsening respiratory status initiated on BiPAP 7/27: on nasal cannula, reports anxiety and tremors consistent with EtOH withdrawal.  Received a total of 15 mg/kg of phenobarbital , however he remained agitated requiring low dose precedex  gtt.  7/28: Pt resting in bed no agitation on precedex  gtt @0 .5 mcg/min.  Orders placed to transfuse 1 unit of pRBC's hgb 7.2 goal hgb 8.0.  No signs of active bleeding  7/29: Pt remains encephalopathic with severe DT's requiring precedex  gtt.  NGT placed to initiate tube feeds and administer medications  7/30: Earlier this morning with vomiting, concern for aspiration event as now with increased WOB and increasing FiO2 requirements (up to 4L). Remains encephalopathic on Precedex .  Respiratory status continued to decline, progressed to agonal respirations, Required INTUBATION for airway protection.  Hypotensive with induction meds despite IV fluids and Levophed  resulting in brief loss of pulse (ROSC obtained after 2 minutes of CPR and 1 epi).  Central line placed due to vasopressors.  Interim History / Subjective:  As outlined above under significant events   Objective    Blood pressure 101/60, pulse 73, temperature 98.3 F (36.8 C), temperature source Axillary, resp. rate (!) 21, height 6' (1.829 m), weight 97 kg, SpO2 98%.        Intake/Output Summary (Last 24 hours) at 07/16/2024 0859 Last data filed at 07/16/2024 0800 Gross per 24 hour  Intake 1410.04 ml  Output 1550 ml  Net -139.96 ml   Filed Weights   07/15/24 0000 07/15/24 0358 07/16/24 0500  Weight: 94.5 kg 95.4 kg 97 kg   Examination: General: Critically ill appearing male, laying in bed, sedated on Precedex , with agonal  respirations,  Lungs: Coarse throughout with gurgling respirations, even, tachypneic and increased work of breathing   Cardiovascular: Tachycardia (ST with PAC's on telemetry), s1s2, + murmur, 2+ radial/1+ distal pulses, no edema Abdomen: +BS x4, obese, soft, non distended, no guarding or rebound tenderness Extremities: Normal bulk and tone, no deformities, no edema Neuro: Sedated on precedex , withdraws from pain and moans, unable to follow commands nor answer orientation questions, pupils PERRL GU: External catheter in place  Resolved problem list   Assessment and Plan   #Acute toxic metabolic encephalopathy  #ETOH withdrawal  #Sedation needs in setting of mechanical ventilation -Treatment of metabolic derangements and withdrawal as outlined below -Maintain a RASS goal of 0 to -1 -Fentanyl  and Propofol  as needed to maintain RASS goal -Avoid sedating medications as able -Daily wake up assessment -CIWA protocol -Discussed with Dr. Malka, will stop Phenobarbital  and Valium   - Continue thiamine , mvi, and  folic acid   - Once mentation improves will need ETOH cessation counseling   #Brief Cardiac Arrest due to severe Hypotension from induction meds (ROSC obtained after 2 minutes of CPR and 1 epi) #Hypotension: suspect sedation related vs developing Septic shock #Severe aortic stenosis  #Elevated troponin's secondary to NSTEMI  #Acute on Chronic HFpEF  Hx: HTN, MI, CAD, and stroke  -Echocardiogram 05/10/24: LVEF 60-65%, grade I DD, RV systolic function and size normal, severe Aortic Stenosis -Cardiac Cath 07/02/24: Severe native 3 vessel disease, successful stenting of distal SVG to PDA with DES -Continuous cardiac monitoring -Maintain MAP >65 -Vasopressors as needed to maintain MAP goal -Trend lactic acid until normalized -HS Troponin peaked at 5.766 -Diuresis as BP and renal function permits ~ Lasix  d/c on 7/30 due to hypotension - Cardiology following, appreciate input ~ pt to  follow-up outpatient to discuss aortic valve repair/replacement options.  Goal is to optimize him regarding volume and symptom standpoint prior to discharge  - Continue aspirin , plavix , and rosuvastatin   - Completed 24hrs of heparin  gtt  - Hold Metoprolol  for now  #Acute on chronic hypoxic respiratory failure  #AECOPD #Aspiration pneumonia #Intubated for airway protection -Full vent support, implement lung protective strategies -Plateau pressures less than 30 cm H20 -Wean FiO2 & PEEP as tolerated to maintain O2 sats 88 to 92% -Follow intermittent Chest X-ray & ABG as needed -Spontaneous Breathing Trials when respiratory parameters met and mental status permits -Implement VAP Bundle -Prn Bronchodilators -ABX as above  #Aspiration pneumonia  - Trend WBC and monitor fever curve  - Follow cultures  - Continue ceftriaxone :  tentative stop date 07/31   #Mild Hypokalemia -Monitor I&O's / urinary output -Follow BMP -Ensure adequate renal perfusion -Avoid nephrotoxic agents as able -Replace electrolytes as indicated ~ Pharmacy following for assistance with electrolyte replacement  #Anemia without signs of bleeding  #Possible GI bleed  Hx: AVM's undergone multiple bidrectional endoscopies without findings of bleeding within the last 2 yrs  - Trend CBC  - Monitor for s/sx of bleeding  - Transfuse for hgb <8  - Continue scheduled niferex  - GI consulted appreciate input: due to no signs of active bleeding no indications for interventions at this time signed off    #Hyperglycemia, likely steroid induced  -CBG's q4h; Target range of 140 to 180 -SSI -Follow ICU Hypo/Hyperglycemia protocol -IV steroids discontinued    Best Practice (right click and Reselect all SmartList Selections daily)   Diet/type: NPO; start TF's  DVT prophylaxis Lovenox  Pressure ulcer(s): N/A GI prophylaxis: PPI Lines: Left internal jugular CVC placed 7/39, and is still needed Foley:  N/A Code Status:   full code Last date of multidisciplinary goals of care discussion [07/16/24]   7/30: No accurate emergency contacted information listed in pts chart. Updated patients best friend's wife Bari Solo at bedside (she reports patient is estranged from his children).  Labs   CBC: Recent Labs  Lab 07/11/24 1706 07/11/24 2137 07/13/24 1449 07/13/24 1705 07/13/24 2329 07/14/24 0645 07/14/24 1632 07/15/24 0307 07/16/24 0334  WBC 8.6   < > 22.6*  --  10.1 8.8  --  12.4* 12.6*  NEUTROABS 4.9  --   --   --   --   --   --   --   --   HGB 8.3*   < > 8.3*   < > 7.1* 7.2* 8.7* 8.6* 8.6*  HCT 26.7*   < > 26.0*  --  23.2* 22.8* 27.5* 27.8* 28.9*  MCV 86.1   < >  85.5  --  85.6 86.4  --  86.9 88.9  PLT 339   < > 346  --  244 232  --  227 234   < > = values in this interval not displayed.    Basic Metabolic Panel: Recent Labs  Lab 07/12/24 0215 07/12/24 0900 07/13/24 0420 07/14/24 0535 07/15/24 0307 07/16/24 0334  NA 136  --  134* 138 139 141  K 4.5  --  3.8 4.0 3.7 3.2*  CL 102  --  99 97* 102 104  CO2 15*  --  26 28 28 28   GLUCOSE 167*  --  183* 133* 117* 129*  BUN 13  --  22 28* 25* 29*  CREATININE 0.94  --  0.90 1.13 0.92 0.99  CALCIUM  8.6*  --  8.5* 8.3* 8.2* 8.1*  MG  --  1.8 2.1 2.2 2.4 2.6*  PHOS  --   --   --   --  2.7 2.7   GFR: Estimated Creatinine Clearance: 89.9 mL/min (by C-G formula based on SCr of 0.99 mg/dL). Recent Labs  Lab 07/11/24 1951 07/11/24 2137 07/12/24 0215 07/12/24 0900 07/12/24 1439 07/13/24 2329 07/14/24 0645 07/15/24 0307 07/16/24 0334  WBC  --  6.2   < > 8.2   < > 10.1 8.8 12.4* 12.6*  LATICACIDVEN 3.6* 5.9*  --  3.4*  --   --   --   --   --    < > = values in this interval not displayed.    Liver Function Tests: Recent Labs  Lab 07/11/24 1706 07/12/24 0900 07/14/24 0535  AST 21 62* 41  ALT 16 18 18   ALKPHOS 43 45 31*  BILITOT 0.9 0.9 1.3*  PROT 7.3 7.3 6.6  ALBUMIN 3.5 3.5 3.2*   No results for input(s): LIPASE, AMYLASE  in the last 168 hours. Recent Labs  Lab 07/14/24 0535  AMMONIA 21    ABG    Component Value Date/Time   PHART 7.36 07/12/2024 0814   PCO2ART 38 07/12/2024 0814   PO2ART 364 (H) 07/12/2024 0814   HCO3 21.5 07/12/2024 0814   ACIDBASEDEF 3.6 (H) 07/12/2024 0814   O2SAT 100 07/12/2024 0814     Coagulation Profile: Recent Labs  Lab 07/11/24 1706  INR 1.1    Cardiac Enzymes: No results for input(s): CKTOTAL, CKMB, CKMBINDEX, TROPONINI in the last 168 hours.  HbA1C: Hemoglobin A1C  Date/Time Value Ref Range Status  12/02/2013 03:52 PM 5.3 4.2 - 6.3 % Final    Comment:    The American Diabetes Association recommends that a primary goal of therapy should be <7% and that physicians should reevaluate the treatment regimen in patients with HbA1c values consistently >8%.    Hgb A1c MFr Bld  Date/Time Value Ref Range Status  10/28/2022 04:37 AM 5.3 4.8 - 5.6 % Final    Comment:    (NOTE) Pre diabetes:          5.7%-6.4%  Diabetes:              >6.4%  Glycemic control for   <7.0% adults with diabetes     CBG: Recent Labs  Lab 07/15/24 1603 07/15/24 1920 07/15/24 2314 07/16/24 0325 07/16/24 0721  GLUCAP 137* 116* 148* 126* 100*    Review of Systems:   Unable to assess pt sedated/AMS/Critical illness  Past Medical History:  He,  has a past medical history of COPD (chronic obstructive pulmonary disease) (HCC), CVA (cerebral infarction), Headache, Hypertension, MI (  myocardial infarction) (HCC) (05/09/2012), Reading difficulty, Stroke [facility]) (2012), and Wears dentures.   Surgical History:   Past Surgical History:  Procedure Laterality Date   CAROTID ENDARTERECTOMY Left    2012 or 2013   CHOLECYSTECTOMY N/A 07/26/2016   Procedure: LAPAROSCOPIC CHOLECYSTECTOMY;  Surgeon: Charlie FORBES Fell, MD;  Location: [facility] ORS;  Service: General;  Laterality: N/A;   COLONOSCOPY N/A 05/08/2024   Procedure: COLONOSCOPY;  Surgeon: Jinny Carmine, MD;  Location: [facility] ENDOSCOPY;   Service: Endoscopy;  Laterality: N/A;   COLONOSCOPY WITH PROPOFOL  N/A 02/09/2022   Procedure: COLONOSCOPY WITH PROPOFOL ;  Surgeon: Jinny Carmine, MD;  Location[facility] ENDOSCOPY;  Service: Endoscopy;  Laterality: N/A;   CORONARY ARTERY BYPASS GRAFT  05/10/2012   3 vessel   CORONARY STENT INTERVENTION N/A 07/02/2024   Procedure: CORONARY STENT INTERVENTION;  Surgeon: Katina Albright, MD;  Location: [facility] INVASIVE CV LAB;  Service: Cardiovascular;  Laterality: N/A;   CORONARY ULTRASOUND/IVUS N/A 07/02/2024   Procedure: Coronary Ultrasound/IVUS;  Surgeon: Katina Albright, MD;  Location: [facility] INVASIVE CV LAB;  Service: Cardiovascular;  Laterality: N/A;   ENTEROSCOPY N/A 05/16/2023   Procedure: ENTEROSCOPY;  Surgeon: Therisa Bi, MD;  Location: [facility] ENDOSCOPY;  Service: Gastroenterology;  Laterality: N/A;   ENTEROSCOPY N/A 06/01/2023   Procedure: ENTEROSCOPY;  Surgeon: Unk Corinn Skiff, MD;  Location: [facility] ENDOSCOPY;  Service: Gastroenterology;  Laterality: N/A;   ESOPHAGOGASTRODUODENOSCOPY  05/13/2023   Procedure: ESOPHAGOGASTRODUODENOSCOPY (EGD);  Surgeon: Jinny Carmine, MD;  Location: [facility] ENDOSCOPY;  Service: Endoscopy;;   ESOPHAGOGASTRODUODENOSCOPY (EGD) WITH PROPOFOL  N/A 10/28/2022   Procedure: ESOPHAGOGASTRODUODENOSCOPY (EGD) WITH PROPOFOL ;  Surgeon: Onita Elspeth Sharper, DO;  Location: [facility] ENDOSCOPY;  Service: Gastroenterology;  Laterality: N/A;   GIVENS CAPSULE STUDY  05/13/2023   Procedure: GIVENS CAPSULE STUDY;  Surgeon: Jinny Carmine, MD;  Location: [facility] ENDOSCOPY;  Service: Endoscopy;;   GIVENS CAPSULE STUDY  06/01/2023   Procedure: GIVENS CAPSULE STUDY;  Surgeon: Unk Corinn Skiff, MD;  Location: [facility] ENDOSCOPY;  Service: Gastroenterology;;   LEFT HEART CATH AND CORS/GRAFTS ANGIOGRAPHY N/A 07/02/2024   Procedure: LEFT HEART CATH AND CORS/GRAFTS ANGIOGRAPHY;  Surgeon: Katina Albright, MD;  Location: [facility] INVASIVE CV LAB;  Service: Cardiovascular;  Laterality: N/A;     Social History:   reports that he  quit smoking about 32 years ago. His smoking use included cigarettes. His smokeless tobacco use includes snuff. He reports current alcohol  use of about 26.0 standard drinks of alcohol  per week. He reports that he does not use drugs.   Family History:  His family history includes Pneumonia in his mother.   Allergies Allergies  Allergen Reactions   Wellbutrin  [Bupropion ]     Paranoia      Home Medications  Prior to Admission medications   Medication Sig Start Date End Date Taking? Authorizing Provider  albuterol  (VENTOLIN  HFA) 108 (90 Base) MCG/ACT inhaler Inhale 2 puffs into the lungs every 6 (six) hours as needed for wheezing or shortness of breath. 07/03/24  Yes Von Bellis, MD  ascorbic acid  (VITAMIN C ) 500 MG tablet Take 1 tablet (500 mg total) by mouth daily. 07/03/24 10/01/24 Yes Von Bellis, MD  aspirin  EC 81 MG tablet Take 1 tablet (81 mg total) by mouth daily. Swallow whole. 07/03/24 12/30/24 Yes Von Bellis, MD  clopidogrel  (PLAVIX ) 75 MG tablet Take 1 tablet (75 mg total) by mouth daily with breakfast. 07/03/24  Yes Von Bellis, MD  dapagliflozin  propanediol (FARXIGA ) 10 MG TABS tablet Take 1 tablet (10 mg total) by mouth daily. 07/03/24  Yes Von,  Dileep, MD  folic acid  (FOLVITE ) 1 MG tablet Take 1 tablet (1 mg total) by mouth daily. 07/03/24 10/01/24 Yes Von Bellis, MD  iron  polysaccharides (NIFEREX) 150 MG capsule Take 1 capsule (150 mg total) by mouth daily. 07/03/24 10/01/24 Yes Von Bellis, MD  isosorbide  mononitrate (IMDUR ) 60 MG 24 hr tablet Take 1 tablet (60 mg total) by mouth daily. 07/03/24 07/03/25 Yes Von Bellis, MD  losartan  (COZAAR ) 25 MG tablet Take 0.5 tablets (12.5 mg total) by mouth daily. 07/04/24 07/04/25 Yes Von Bellis, MD  metoprolol  succinate (TOPROL -XL) 25 MG 24 hr tablet Take 0.5 tablets (12.5 mg total) by mouth daily. 07/03/24  Yes Von Bellis, MD  pantoprazole  (PROTONIX ) 40 MG tablet Take 1 tablet (40 mg total) by mouth daily. 07/03/24 10/01/24  Yes Von Bellis, MD  pravastatin  (PRAVACHOL ) 20 MG tablet Take 1 tablet (20 mg total) by mouth at bedtime. 07/03/24 07/03/25 Yes Von Bellis, MD  spironolactone  (ALDACTONE ) 25 MG tablet Take 0.5 tablets (12.5 mg total) by mouth daily. 07/03/24  Yes Von Bellis, MD  torsemide  (DEMADEX ) 10 MG tablet Take 1 tablet (10 mg total) by mouth daily. 07/03/24  Yes Von Bellis, MD     Critical care time: 42 minutes       Inge Lecher, AGACNP-BC [facility] Prefer epic messenger for cross cover needs If after hours, please call E-link

## 2024-07-16 NOTE — Significant Event (Signed)
 PATIENT NAME: [name] MEDICAL RECORD NUMBER: [id number] Birthday: 01/20/1961  Age: 63 y.o. Admit Date: 07/11/2024  Provider: Dr. Malka, Inge Lecher NP  Indication: Brief Cardiac Arrest  Technical Description:  CPR performance duration: 2 minutes Was defibrillation or cardioversion used ? No Was external pacer placed ? No Was patient intubated pre/post CPR ? Yes Was transvenous pacer placed ? No  Medications Administered Include      Yes/no Amiodarone    Atropin   Calcium    Epinephrine       1 amp  Lidocaine    Magnesium    Norepinephrine        Drip  Phenylephrine    Sodium bicarbonate   Vasopression    Evaluation Final Status - Was patient successfully resuscitated ? Yes If successfully resuscitated - what is current rhythm ? NSR with PAC's If successfully resuscitated - what is current hemodynamic status ? Stable on Levophed   Miscellaneous Information  Pt encephalopathic with severe DT's requiring Precedex  gtt. This morning pt had aspiration event which resulted in declining respiratory status and increasing FiO2 requirements (quickly progressed to agonal respirations), thus decision to proceed emergently with intubation to prevent impending respiratory arrest and provide airway protection.  Pt's BP was soft prior to intubation (SBP 90's to low 100's) of which he was started on 10 mcg of Levophed  and 500 cc LR bolus prior to intubation.  With induction meds, pt became severely hypotensive (SBP 50's).  Given severe hypotension, wanted to ensure perfusion, thus checked for pulse.  Was unable to palpate a pulse, thus CPR begun immediately and 1 amp Epi given.  ROSC was obtained after 2 minutes of CPR (NSR with PAC's on telemetry).    Inge Lecher, AGACNP-BC [facility] Prefer epic messenger for cross cover needs If after hours, please call E-link   Inge JONETTA Lecher 7/30/202512:48 PM

## 2024-07-17 DIAGNOSIS — J9621 Acute and chronic respiratory failure with hypoxia: Secondary | ICD-10-CM | POA: Diagnosis not present

## 2024-07-17 DIAGNOSIS — G928 Other toxic encephalopathy: Secondary | ICD-10-CM | POA: Diagnosis not present

## 2024-07-17 DIAGNOSIS — I959 Hypotension, unspecified: Secondary | ICD-10-CM | POA: Diagnosis not present

## 2024-07-17 DIAGNOSIS — I469 Cardiac arrest, cause unspecified: Secondary | ICD-10-CM | POA: Diagnosis not present

## 2024-07-17 LAB — URINALYSIS, COMPLETE (UACMP) WITH MICROSCOPIC
Bilirubin Urine: NEGATIVE
Glucose, UA: 150 mg/dL — AB
Ketones, ur: NEGATIVE mg/dL
Leukocytes,Ua: NEGATIVE
Nitrite: NEGATIVE
Protein, ur: 100 mg/dL — AB
Specific Gravity, Urine: 1.029 (ref 1.005–1.030)
pH: 5 (ref 5.0–8.0)

## 2024-07-17 LAB — CBC
HCT: 29.2 % — ABNORMAL LOW (ref 39.0–52.0)
Hemoglobin: 8.9 g/dL — ABNORMAL LOW (ref 13.0–17.0)
MCH: 27 pg (ref 26.0–34.0)
MCHC: 30.5 g/dL (ref 30.0–36.0)
MCV: 88.5 fL (ref 80.0–100.0)
Platelets: 262 K/uL (ref 150–400)
RBC: 3.3 MIL/uL — ABNORMAL LOW (ref 4.22–5.81)
RDW: 15.9 % — ABNORMAL HIGH (ref 11.5–15.5)
WBC: 31.2 K/uL — ABNORMAL HIGH (ref 4.0–10.5)
nRBC: 0 % (ref 0.0–0.2)

## 2024-07-17 LAB — GLUCOSE, CAPILLARY
Glucose-Capillary: 110 mg/dL — ABNORMAL HIGH (ref 70–99)
Glucose-Capillary: 111 mg/dL — ABNORMAL HIGH (ref 70–99)
Glucose-Capillary: 130 mg/dL — ABNORMAL HIGH (ref 70–99)
Glucose-Capillary: 130 mg/dL — ABNORMAL HIGH (ref 70–99)
Glucose-Capillary: 140 mg/dL — ABNORMAL HIGH (ref 70–99)
Glucose-Capillary: 141 mg/dL — ABNORMAL HIGH (ref 70–99)
Glucose-Capillary: 86 mg/dL (ref 70–99)

## 2024-07-17 LAB — MAGNESIUM: Magnesium: 2.6 mg/dL — ABNORMAL HIGH (ref 1.7–2.4)

## 2024-07-17 LAB — RENAL FUNCTION PANEL
Albumin: 2.9 g/dL — ABNORMAL LOW (ref 3.5–5.0)
Anion gap: 11 (ref 5–15)
BUN: 34 mg/dL — ABNORMAL HIGH (ref 8–23)
CO2: 27 mmol/L (ref 22–32)
Calcium: 7.9 mg/dL — ABNORMAL LOW (ref 8.9–10.3)
Chloride: 105 mmol/L (ref 98–111)
Creatinine, Ser: 1.38 mg/dL — ABNORMAL HIGH (ref 0.61–1.24)
GFR, Estimated: 57 mL/min — ABNORMAL LOW (ref >60)
Glucose, Bld: 129 mg/dL — ABNORMAL HIGH (ref 70–99)
Phosphorus: 3.1 mg/dL (ref 2.5–4.6)
Potassium: 3.9 mmol/L (ref 3.5–5.1)
Sodium: 143 mmol/L (ref 135–145)

## 2024-07-17 LAB — HEMOGLOBIN AND HEMATOCRIT, BLOOD
HCT: 28.6 % — ABNORMAL LOW (ref 39.0–52.0)
HCT: 25.3 % — ABNORMAL LOW (ref 39.0–52.0)
Hemoglobin: 8.5 g/dL — ABNORMAL LOW (ref 13.0–17.0)
Hemoglobin: 7.5 g/dL — ABNORMAL LOW (ref 13.0–17.0)

## 2024-07-17 LAB — PREPARE RBC (CROSSMATCH)

## 2024-07-17 LAB — MRSA NEXT GEN BY PCR, NASAL: MRSA by PCR Next Gen: NOT DETECTED

## 2024-07-17 LAB — TRIGLYCERIDES: Triglycerides: 68 mg/dL (ref <150)

## 2024-07-17 LAB — OCCULT BLOOD X 1 CARD TO LAB, STOOL: Fecal Occult Bld: POSITIVE — AB

## 2024-07-17 MED ORDER — PIPERACILLIN-TAZOBACTAM 3.375 G IVPB
3.3750 g | Freq: Three times a day (TID) | INTRAVENOUS | Status: DC
  Administered 2024-07-17 – 2024-07-21 (×13): 3.375 g via INTRAVENOUS
  Filled 2024-07-17 (×13): qty 50

## 2024-07-17 MED ORDER — PIVOT 1.5 CAL PO LIQD: 1000.0000 mL | ORAL | Status: DC

## 2024-07-17 MED ORDER — METOPROLOL TARTRATE 5 MG/5ML IV SOLN: 2.5000 mg | INTRAVENOUS | Status: DC | PRN

## 2024-07-17 MED ORDER — AMIODARONE HCL IN DEXTROSE 360-4.14 MG/200ML-% IV SOLN
60.0000 mg/h | INTRAVENOUS | Status: AC
  Administered 2024-07-17 (×5): 60 mg/h via INTRAVENOUS
  Filled 2024-07-17 (×4): qty 200

## 2024-07-17 MED ORDER — VANCOMYCIN HCL 2000 MG/400ML IV SOLN
2000.0000 mg | Freq: Once | INTRAVENOUS | Status: DC
  Filled 2024-07-17 (×2): qty 400

## 2024-07-17 MED ORDER — SODIUM CHLORIDE 0.9% IV SOLUTION: Freq: Once | INTRAVENOUS | Status: AC

## 2024-07-17 MED ORDER — PIVOT 1.5 CAL PO LIQD
1000.0000 mL | ORAL | Status: DC
  Administered 2024-07-17 – 2024-07-23 (×5): 1000 mL

## 2024-07-17 NOTE — Plan of Care (Signed)
  Problem: Clinical Measurements: Goal: Ability to maintain clinical measurements within normal limits will improve Outcome: Progressing Goal: Diagnostic test results will improve Outcome: Progressing Goal: Cardiovascular complication will be avoided Outcome: Progressing   Problem: Nutrition: Goal: Adequate nutrition will be maintained Outcome: Progressing   Problem: Coping: Goal: Level of anxiety will decrease Outcome: Progressing   Problem: Elimination: Goal: Will not experience complications related to bowel motility Outcome: Progressing Goal: Will not experience complications related to urinary retention Outcome: Progressing   Problem: Pain Managment: Goal: General experience of comfort will improve and/or be controlled Outcome: Progressing   Problem: Skin Integrity: Goal: Risk for impaired skin integrity will decrease Outcome: Progressing   Problem: Respiratory: Goal: Ability to maintain a clear airway will improve Outcome: Progressing Goal: Levels of oxygenation will improve Outcome: Progressing

## 2024-07-17 NOTE — Progress Notes (Signed)
 [facility] CLINIC CARDIOLOGY PROGRESS NOTE       Patient ID: [name] MRN: [id number] DOB/AGE: 1961/01/11 63 y.o.  Admit date: 07/11/2024 Referring Physician Dr. Charlie Sellar Primary Physician Osa Geralds, NP  Primary Cardiologist Tinnie Maiden, NP  Reason for Consultation elevated troponins  HPI: [name] is a 63 y.o. 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 Found to have pneumonia, overnight troponins uptrending. Cardiology was consulted for further evaluation.   Interval history: -Patient seen and examined this AM, resting in bed. Patient remains intubated and sedated. -Patient off levo on 07/31 with improved BP. HR stable.  -Per tele patient with bigeminy PVCs, started on IV amio yesterday evening for ventricular ectopy. nonsustained 4 beat VT on 07/30 at 2055. Last bigeminy episode noted on tele on 07/31 at 0645.  -Reported to have episodes of loose black stool yesterday evening.  -Hgb has remained stable 8.9 > 8.5 > 8.6   Review of systems complete and found to be negative unless listed above    Past Medical History:  Diagnosis Date   COPD (chronic obstructive pulmonary disease) (HCC)    CVA (cerebral infarction)    Headache    mild, since stroke 2012   Hypertension    MI (myocardial infarction) (HCC) 05/09/2012   Reading difficulty    pt reports he reads at about a second grade level   Stroke [facility]) 2012   numbness to left hand    Wears dentures    full upper and lower    Past Surgical History:  Procedure Laterality Date   CAROTID ENDARTERECTOMY Left    2012 or 2013   CHOLECYSTECTOMY N/A 07/26/2016   Procedure:  LAPAROSCOPIC CHOLECYSTECTOMY;  Surgeon: Charlie FORBES Fell, MD;  Location: [facility] ORS;  Service: General;  Laterality: N/A;   COLONOSCOPY N/A 05/08/2024   Procedure: COLONOSCOPY;  Surgeon: Jinny Carmine, MD;  Location: [facility] ENDOSCOPY;  Service: Endoscopy;  Laterality: N/A;   COLONOSCOPY WITH PROPOFOL  N/A 02/09/2022   Procedure: COLONOSCOPY WITH PROPOFOL ;  Surgeon: Jinny Carmine, MD;  Location: [facility] ENDOSCOPY;  Service: Endoscopy;  Laterality: N/A;   CORONARY ARTERY BYPASS GRAFT  05/10/2012   3 vessel   CORONARY STENT INTERVENTION N/A 07/02/2024   Procedure: CORONARY STENT INTERVENTION;  Surgeon: Katina Albright, MD;  Location: [facility] INVASIVE CV LAB;  Service: Cardiovascular;  Laterality: N/A;   CORONARY ULTRASOUND/IVUS N/A 07/02/2024   Procedure: Coronary Ultrasound/IVUS;  Surgeon: Katina Albright, MD;  Location: [facility] INVASIVE CV LAB;  Service: Cardiovascular;  Laterality: N/A;   ENTEROSCOPY N/A 05/16/2023   Procedure: ENTEROSCOPY;  Surgeon: Therisa Bi, MD;  Location: [facility] ENDOSCOPY;  Service: Gastroenterology;  Laterality: N/A;   ENTEROSCOPY N/A 06/01/2023   Procedure: ENTEROSCOPY;  Surgeon: Unk Corinn Skiff, MD;  Location: [facility] ENDOSCOPY;  Service: Gastroenterology;  Laterality: N/A;   ESOPHAGOGASTRODUODENOSCOPY  05/13/2023   Procedure: ESOPHAGOGASTRODUODENOSCOPY (EGD);  Surgeon: Jinny Carmine, MD;  Location: [facility] ENDOSCOPY;  Service: Endoscopy;;   ESOPHAGOGASTRODUODENOSCOPY (EGD) WITH PROPOFOL  N/A 10/28/2022   Procedure: ESOPHAGOGASTRODUODENOSCOPY (EGD) WITH PROPOFOL ;  Surgeon: Onita Elspeth Sharper, DO;  Location: [facility] ENDOSCOPY;  Service: Gastroenterology;  Laterality: N/A;   GIVENS CAPSULE STUDY  05/13/2023   Procedure: GIVENS CAPSULE STUDY;  Surgeon: Jinny Carmine, MD;  Location: [facility] ENDOSCOPY;  Service: Endoscopy;;   GIVENS CAPSULE STUDY  06/01/2023   Procedure: GIVENS CAPSULE STUDY;  Surgeon: Unk Corinn Skiff, MD;  Location: [facility] ENDOSCOPY;  Service: Gastroenterology;;   LEFT HEART CATH AND CORS/GRAFTS  ANGIOGRAPHY N/A 07/02/2024   Procedure: LEFT HEART CATH AND CORS/GRAFTS ANGIOGRAPHY;  Surgeon: Katina Albright, MD;  Location: [facility] INVASIVE CV LAB;  Service: Cardiovascular;  Laterality: N/A;    Medications Prior to Admission  Medication Sig Dispense Refill Last Dose/Taking   albuterol  (VENTOLIN  HFA) 108 (90 Base) MCG/ACT inhaler Inhale 2 puffs into the lungs every 6 (six) hours as needed for wheezing or shortness of breath. 6.7 g 2 07/11/2024 Morning   ascorbic acid  (VITAMIN C ) 500 MG tablet Take 1 tablet (500 mg total) by mouth daily. 30 tablet 2 07/11/2024 Morning   aspirin  EC 81 MG tablet Take 1 tablet (81 mg total) by mouth daily. Swallow whole. 30 tablet 5 07/11/2024 Morning   clopidogrel  (PLAVIX ) 75 MG tablet Take 1 tablet (75 mg total) by mouth daily with breakfast. 30 tablet 11 07/11/2024 Morning   dapagliflozin  propanediol (FARXIGA ) 10 MG TABS tablet Take 1 tablet (10 mg total) by mouth daily. 30 tablet 11 07/11/2024 Morning   folic acid  (FOLVITE ) 1 MG tablet Take 1 tablet (1 mg total) by mouth daily. 30 tablet 2 07/11/2024   iron  polysaccharides (NIFEREX) 150 MG capsule Take 1 capsule (150 mg total) by mouth daily. 30 capsule 2 07/11/2024   isosorbide  mononitrate (IMDUR ) 60 MG 24 hr tablet Take 1 tablet (60 mg total) by mouth daily. 30 tablet 11 07/11/2024   losartan  (COZAAR ) 25 MG tablet Take 0.5 tablets (12.5 mg total) by mouth daily. 30 tablet 11 07/11/2024 Morning   metoprolol  succinate (TOPROL -XL) 25 MG 24 hr tablet Take 0.5 tablets (12.5 mg total) by mouth daily. 30 tablet 11 07/11/2024 Morning   pantoprazole  (PROTONIX ) 40 MG tablet Take 1 tablet (40 mg total) by mouth daily. 30 tablet 2 07/11/2024 Morning   pravastatin  (PRAVACHOL ) 20 MG tablet Take 1 tablet (20 mg total) by mouth at bedtime. 30 tablet 11 07/10/2024 Evening   spironolactone  (ALDACTONE ) 25 MG tablet Take 0.5 tablets (12.5 mg total) by mouth daily. 30 tablet 11 07/11/2024 Morning   torsemide  (DEMADEX ) 10 MG tablet Take 1 tablet (10  mg total) by mouth daily. 30 tablet 11 07/11/2024   Social History   Socioeconomic History   Marital status: Divorced    Spouse name: Not on file   Number of children: Not on file   Years of education: Not on file   Highest education level: Not on file  Occupational History   Not on file  Tobacco Use   Smoking status: Former    Current packs/day: 0.00    Types: Cigarettes    Quit date: 39    Years since quitting: 32.6   Smokeless tobacco: Current    Types: Snuff   Tobacco comments:    changed to dip  Vaping Use   Vaping status: Never Used  Substance and Sexual Activity   Alcohol  use: Yes    Alcohol /week: 26.0 standard drinks of alcohol     Types: 26 Cans of beer per week   Drug use: No   Sexual activity: Yes    Birth control/protection: None  Other Topics Concern   Not on file  Social History Narrative   Not on file   Social Drivers of Health   Financial Resource Strain: Not on file  Food Insecurity: No Food Insecurity (07/12/2024)   Hunger Vital Sign    Worried About Running Out of Food in the Last Year: Never true    Ran Out of Food in the Last Year: Never true  Transportation Needs: No Transportation Needs (07/12/2024)   PRAPARE - Administrator, Civil Service (Medical): No    Lack of Transportation (Non-Medical): No  Physical Activity: Not on file  Stress: Not on file  Social Connections: Socially Isolated (07/12/2024)   Social Connection and Isolation Panel    Frequency of Communication with Friends and Family: Once a week    Frequency of Social Gatherings with Friends and Family: Once a week    Attends Religious Services: Never    Database administrator or Organizations: No    Attends Banker Meetings: Never    Marital Status: Divorced  Catering manager Violence: Not At Risk (07/12/2024)   Humiliation, Afraid, Rape, and Kick questionnaire    Fear of Current or Ex-Partner: No    Emotionally Abused: No    Physically Abused: No     Sexually Abused: No    Family History  Problem Relation Age of Onset   Pneumonia Mother      Vitals:   07/17/24 0915 07/17/24 0930 07/17/24 0945 07/17/24 1000  BP: (!) 83/47 (!) 81/49 (!) 90/56 (!) 95/48  Pulse: (!) 42 (!) 42 (!) 40 61  Resp: 13 20 20 20   Temp:      TempSrc:      SpO2: 96% 96% 96% 96%  Weight:      Height:        PHYSICAL EXAM General: Ill appearing male, intubated,  sedated HEENT: Normocephalic and atraumatic. Neck: No JVD.  Lungs: Mechanically breath sounds bilaterally.  Heart: HRRR, elevated rate. Normal S1 and S2 without gallops. +systolic ejection murmur.   Abdomen: Non-distended appearing.  Msk: Normal strength and tone for age. Extremities: Warm and well perfused. No clubbing, cyanosis, edema.   Labs: Basic Metabolic Panel: Recent Labs    07/16/24 0334 07/16/24 1608 07/16/24 1611 07/17/24 0222  NA 141  --  145 143  K 3.2*  --  3.5 3.9  CL 104  --  107 105  CO2 28  --  26 27  GLUCOSE 129*  --  173* 129*  BUN 29*  --  33* 34*  CREATININE 0.99  --  1.27* 1.38*  CALCIUM  8.1*  --  7.5* 7.9*  MG 2.6* 2.4  --  2.6*  PHOS 2.7  --   --  3.1   Liver Function Tests: Recent Labs    07/17/24 0222  ALBUMIN 2.9*   No results for input(s): LIPASE, AMYLASE in the last 72 hours. CBC: Recent Labs    07/16/24 0334 07/17/24 0222 07/17/24 0802  WBC 12.6* 31.2*  --   HGB 8.6* 8.9* 8.5*  HCT 28.9* 29.2* 28.6*  MCV 88.9 88.5  --   PLT 234 262  --    Cardiac Enzymes: Recent Labs    07/16/24 1611 07/16/24 1928 07/16/24 2317  TROPONINIHS 3,191* 3,088* 2,707*    BNP: No results for input(s): BNP in the last 72 hours.  D-Dimer: No results for input(s): DDIMER in the last 72 hours. Hemoglobin A1C: No results for input(s): HGBA1C in the last 72 hours. Fasting Lipid Panel: Recent Labs    07/17/24 0222  TRIG 68   Thyroid Function Tests: No results for input(s): TSH, T4TOTAL, T3FREE, THYROIDAB in the last  72  hours.  Invalid input(s): FREET3 Anemia Panel: No results for input(s): VITAMINB12, FOLATE, FERRITIN, TIBC, IRON , RETICCTPCT in the last 72 hours.   Radiology: [facility] Chest Port 1 View Result Date: 07/16/2024 CLINICAL DATA:  Endotracheal tube and central line placement. EXAM: PORTABLE CHEST 1 VIEW COMPARISON:  07/16/2024 at 8:58 a.m. FINDINGS: Patient is rotated to the right. Endotracheal tube has tip proximally 13.5 cm above the carina. This could be advanced approximately 6 cm. Left IJ central venous catheter has tip over the SVC. Enteric tube courses into the region of the stomach and off the image as tip is not visualized. Lungs are adequately inflated with possible mild hazy opacification over the right upper lung abutting the minor fissure which may be due to atelectasis or infection. Left lung is clear. Cardiomediastinal silhouette and remainder of the exam is unchanged. IMPRESSION: 1. Endotracheal tube has tip proximally 13.5 cm above the carina. This could be advanced approximately 6 cm. 2. Left IJ central venous catheter with tip over the SVC. 3. Possible mild hazy opacification over the right upper lung abutting the minor fissure which may be due to atelectasis or infection. Electronically Signed   By: Toribio Agreste M.D.   On: 07/16/2024 10:53   DG Chest Port 1 View Result Date: 07/16/2024 CLINICAL DATA:  [id number] Aspiration into airway [id number]. EXAM: PORTABLE CHEST 1 VIEW COMPARISON:  06/23/2024. FINDINGS: Bilateral lungs appear hyperlucent with coarse bronchovascular markings, in keeping with COPD. Bilateral lungs otherwise appear clear. No dense consolidation or lung collapse. Bilateral costophrenic angles are clear. Stable cardio-mediastinal silhouette. There are surgical staples along the heart border and sternotomy wires, status post CABG (coronary artery bypass graft). No acute osseous abnormalities. The soft tissues are within normal limits. Enteric tube is seen coursing below  the left hemidiaphragm however, the tip is not included on the film. IMPRESSION: No active disease. COPD. Electronically Signed   By: Ree Molt M.D.   On: 07/16/2024 09:09   DG Abd 1 View Result Date: 07/16/2024 CLINICAL DATA:  Vomiting.  NG tube placement. EXAM: ABDOMEN - 1 VIEW COMPARISON:  07/15/2024 FINDINGS: Nasogastric tube has tip and side-port over the stomach in the upper abdomen just left of midline. Visualized lower thorax unchanged. Abdominopelvic images demonstrate a non obstructive bowel gas pattern. Mild fecal retention throughout the colon. Surgical clips over the right upper quadrant. There are degenerative changes of the spine and hips. Calcified plaque over the abdominal aorta and iliac arteries. IMPRESSION: 1. Nasogastric tube with tip and side-port over the stomach. 2. Nonobstructive bowel gas pattern. Mild fecal retention. 3. Aortic atherosclerosis. Electronically Signed   By: Toribio Agreste M.D.   On: 07/16/2024 07:51   DG Abd 1 View Result Date: 07/15/2024 CLINICAL DATA:  Orogastric tube placement. EXAM: ABDOMEN - 1 VIEW COMPARISON:  June 29, 2023. FINDINGS: Distal tip of nasogastric tube is seen in expected position of proximal stomach. IMPRESSION: Nasogastric tube tip seen in expected position of proximal stomach. Electronically Signed   By: Lynwood Landy Raddle M.D.   On: 07/15/2024 10:25   DG Chest Port 1 View Result Date: 07/13/2024 CLINICAL DATA:  Respiratory failure with hypoxia. EXAM: PORTABLE CHEST 1 VIEW COMPARISON:  Multiple previous chest x-rays and recent chest CT. FINDINGS: Stable surgical changes from bypass surgery. The cardiac silhouette, mediastinal and hilar contours are within normal limits and stable. Underlying emphysematous changes and pulmonary scarring. No pleural effusions or discrete pulmonary infiltrates. No pneumothorax. IMPRESSION: Emphysematous changes and pulmonary scarring but no  acute overlying pulmonary process. Electronically Signed   By: MYRTIS Stammer M.D.   On: 07/13/2024 16:08   US  Abdomen Limited RUQ (LIVER/GB) Result Date: 07/13/2024 CLINICAL DATA:  Portal hypertension. EXAM: ULTRASOUND ABDOMEN LIMITED RIGHT UPPER QUADRANT COMPARISON:  Abdominal ultrasound 10/19/2022 FINDINGS: Gallbladder: Surgically absent. Common bile duct: Diameter: 3.3 mm Liver: No focal lesion identified. Parenchymal echogenicity is increased and diffusely heterogeneous. There is lobular liver contour. Portal vein is patent on color Doppler imaging with normal direction of blood flow towards the liver. Other: None. IMPRESSION: Lobular liver contour with increased echogenicity and heterogeneous parenchyma consistent with cirrhosis. No focal liver lesion identified. Electronically Signed   By: Greig Pique M.D.   On: 07/13/2024 16:03   DG Chest Port 1 View Result Date: 07/12/2024 CLINICAL DATA:  Shortness of breath. EXAM: PORTABLE CHEST 1 VIEW COMPARISON:  07/11/2024 FINDINGS: The cardio pericardial silhouette is enlarged. There is pulmonary vascular congestion without overt pulmonary edema. Trace bilateral pleural effusions. Old posterior left sixth rib fracture. Telemetry leads overlie the chest. IMPRESSION: Enlargement of the cardiopericardial silhouette with new pulmonary vascular congestion and trace bilateral pleural effusions. Electronically Signed   By: Camellia Candle M.D.   On: 07/12/2024 12:11   CT Angio Chest PE W/Cm &/Or Wo Cm Result Date: 07/11/2024 CLINICAL DATA:  High probability for PE. Chest pain with shortness of breath EXAM: CT ANGIOGRAPHY CHEST WITH CONTRAST TECHNIQUE: Multidetector CT imaging of the chest was performed using the standard protocol during bolus administration of intravenous contrast. Multiplanar CT image reconstructions and MIPs were obtained to evaluate the vascular anatomy. RADIATION DOSE REDUCTION: This exam was performed according to the departmental dose-optimization program which includes automated exposure control, adjustment of  the mA and/or kV according to patient size and/or use of iterative reconstruction technique. CONTRAST:  75mL OMNIPAQUE  IOHEXOL  350 MG/ML SOLN COMPARISON:  CT angiogram chest 05/09/2024 FINDINGS: Cardiovascular: Satisfactory opacification of the pulmonary arteries to the segmental level. No evidence of pulmonary embolism. Patient is status post cardiac surgery. The heart is enlarged. There is no pericardial effusion. There are atherosclerotic calcifications of the aorta and coronary arteries. Aorta is normal in size. Mediastinum/Nodes: There are enlarged right hilar lymph nodes measuring up to 11 mm. Enlarged paratracheal lymph nodes measure up to 10 mm. Visualized thyroid gland and esophagus are within normal limits. Lungs/Pleura: Moderate emphysema again seen. There is stable scarring in the right lung apex. There is new patchy slightly nodular airspace disease in the right upper lobe measuring 2.0 by 1.1 cm. There is right-sided central peribronchial wall thickening. Previously identified airspace disease in the left lower lobe has resolved. There is no pleural effusion or pneumothorax. There is a stable left lower lobe pulmonary nodule image 6/57. Upper Abdomen: No acute abnormality. Musculoskeletal: No chest wall abnormality. No acute or significant osseous findings. Review of the MIP images confirms the above findings. IMPRESSION: 1. No evidence for pulmonary embolism. 2. New patchy slightly nodular airspace disease in the right upper lobe worrisome for pneumonia. Follow-up CT recommended in 4-6 weeks recommended to ensure resolution and to exclude underlying nodule. 3. Right-sided central peribronchial wall thickening worrisome for bronchitis. 4. Right hilar and mediastinal lymphadenopathy, likely reactive. 5. Stable left lower lobe pulmonary nodule. 6. Cardiomegaly. Aortic Atherosclerosis (ICD10-I70.0) and Emphysema (ICD10-J43.9). Electronically Signed   By: Greig Pique M.D.   On: 07/11/2024 18:54   DG  Chest Portable 1 View Result Date: 07/11/2024 CLINICAL DATA:  SOB EXAM: PORTABLE CHEST - 1 VIEW COMPARISON:  June 24, 2024 FINDINGS: Biapical pleural thickening. Sternotomy wires and CABG markers. No focal airspace consolidation, pleural effusion, or pneumothorax. Mild cardiomegaly. Tortuous aorta with aortic atherosclerosis. No acute fracture or destructive lesions. Multilevel thoracic osteophytosis. IMPRESSION: No acute cardiopulmonary abnormality. Electronically Signed   By: Rogelia Myers M.D.   On: 07/11/2024 17:25   CARDIAC CATHETERIZATION Result Date: 07/02/2024   Prox LAD to Mid LAD lesion is 90% stenosed.   Ost LM to Mid LM lesion is 50% stenosed.   Ost Cx to Prox Cx lesion is 90% stenosed.   Ost RCA to Prox RCA lesion is 100% stenosed.   Mid Graft to Dist Graft lesion is 95% stenosed.   Recommend uninterrupted dual antiplatelet therapy with Aspirin  81mg  daily and Clopidogrel  75mg  daily for a minimum of 6 months (stable ischemic heart disease-Class I recommendation). 1.  Severe native three-vessel CAD 2.  LIMA to LAD and SVG to OM widely patent 3.  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  Successful direct stenting of the distal SVG to PDA with distal protection with a 4.0 x 15 mm drug-eluting stent with intravascular ultrasound guidance 5.  Aspirin  and clopidogrel  for at least 6 months followed by clopidogrel  indefinitely 6.  Continue workup for aortic valve replacement   DG Chest 2 View Result Date: 06/24/2024 CLINICAL DATA:  sob EXAM: CHEST - 2 VIEW COMPARISON:  June 03, 2024 FINDINGS: Biapical pleural thickening. No focal airspace consolidation, pleural effusion, or pneumothorax. No cardiomegaly. Sternotomy wires and CABG markers. Tortuous aorta with aortic atherosclerosis. No acute fracture or destructive lesions. Multilevel thoracic osteophytosis. Osteopenia. IMPRESSION: No acute cardiopulmonary abnormality. Electronically Signed   By: Rogelia Myers M.D.   On: 06/24/2024 11:54     ECHO 04/2024: 1. Left ventricular ejection fraction, by estimation, is 60 to 65%. The left ventricle has normal function. The left ventricle has no regional wall motion abnormalities. The left ventricular internal cavity size was mildly dilated. Left ventricular diastolic parameters are consistent with Grade I diastolic dysfunction (impaired relaxation).   2. Right ventricular systolic function is normal. The right ventricular  size is normal.   3. The mitral valve is normal in structure. No evidence of mitral valve  regurgitation.   4. The aortic valve is normal in structure. Aortic valve regurgitation is  trivial. Severe aortic valve stenosis.   TELEMETRY reviewed by me 07/17/2024: sinus rhythm, rate 80s  EKG reviewed by me: sinus tachycardia ST depression, rate 129 bpm  Data reviewed by me 07/17/2024: last 24h vitals tele labs imaging I/O hospitalist progress note, PCCM notes  Principal Problem:   Acute respiratory distress Active Problems:   Hyperlipidemia   Cognitive impairment   Alcohol  abuse   Essential hypertension   CAD (coronary artery disease)   Stroke (HCC)   Iron  deficiency anemia   Acute on chronic diastolic CHF (congestive heart failure) (HCC)   COPD exacerbation (HCC)   Elevated lactic acid level   Lobar pneumonia (HCC)   Metabolic acidosis, increased anion gap   Myocardial injury    ASSESSMENT AND PLAN:  [name] is a 63 y.o. 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  Found to have pneumonia, overnight troponins uptrending. Cardiology was consulted for further evaluation.   # Ventricular Ectopy Per tele patient with  bigeminy PVCs, started on IV amio on 07/30 for ventricular ectopy. nonsustained 4 beat VT on 07/30 at 2055. Last bigeminy episode noted on tele on 07/31 at 0645. Per tele this AM SR with rate 80s. -Continue to monitor telemetry closely.  -Monitor and replenish electrolytes for a goal K >4, Mag >2  -Continue IV amio infusion.   # Acute hypoxic respiratory failure, mechanical ventilation # Aspiration pneumonia # Alcohol  withdrawal # Acute on chronic HFpEF (flash pulmonary edema) # Severe aortic stenosis Presented with SOB, cough, imaging concerning for HCAP/COPD exacerbation. LA elevated on presentation at 3.6 > 5.9> 3.4. Patient intubated. Patient appears near euvolemia. -Hold home dapagliflozin  10 mg daily until BP stabilizes. -Will plan to optimize GDMT when patient hemodynamically stable. -Further management of respiratory failure/ aspiration PNA as per primary team.  -CIWA protocol given hx of alcohol  abuse. -Scheduled to see Dr. Katina 7/30 to discuss aortic valve repair/replacement options. Will reschedule this appointment when patient stabilizes.    # NSTEMI # Coronary artery disease s/p CABG x3 (04/2012) # Hypertension # Hyperlipidemia With recent LHC, s/p DES to SVG to PDA 07/02/2024. Reported some CP when he first came to hospital which has now resolved, no recurrence. Troponin trended 44 > 144 > 205 > 980 > 1862 > 5766 > 5756. Patient weaned off levo on 07/31, BP improved.  -Continue home plavix  75 mg daily. Continue aspirin  81 mg daily.  (Noted to have black stool on 07/30, Hgb has remained stable). Continue DAPT due to recent stent placement on 07/16. -Started on heparin  gtt by [facility] 7/26, Low Hgb. Completed 24 hours of heparin .  -Continue Crestor  20 mg daily for high-intensity statin management.  -Hold home metoprolol  succinate until BP improves and stabilizes.  -Home losartan , Imdur  held due to BP borderline. Will consider resuming as BP continues to improve.  -Recommend MAP >  65.   # Acute on chronic anemia # Hx AVM Patient received blood transfusion on 07/28 with improving Hgb. Hgb has remained stable. -Closely monitor H&H. -Continue PPIs, GI consulted and following. -Goal Hgb >8 due to cardiac history. Recommend transfusion. 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  Did the patient have an acute coronary syndrome (MI, NSTEMI, STEMI, etc) this admission?:  Yes                               AHA/ACC ACS Clinical Performance & Quality Measures: Aspirin  prescribed? - Yes ADP Receptor Inhibitor (Plavix /Clopidogrel , Brilinta/Ticagrelor or Effient/Prasugrel) prescribed (includes medically managed patients)? - Yes Beta Blocker prescribed? - Yes High Intensity Statin (Lipitor 40-80mg  or Crestor  20-40mg ) prescribed? - Yes EF assessed during THIS hospitalization? - No - patient with recent echo performed on 05/10/2024.  For EF <40%, was ACEI/ARB prescribed? - Not Applicable (EF >/= 40%) For EF <40%, Aldosterone Antagonist (Spironolactone  or Eplerenone) prescribed? - Not Applicable (EF >/= 40%) Cardiac Rehab Phase II ordered (including medically managed patients)? - Yes, Patient with recent outpatient LHC performed on 07/16, referred to cardiac rehab during that time on 07/04/24.     This patient's plan of care was discussed and created with Dr. Wilburn and he is in agreement.  Signed: Dorene Comfort, PA-C  07/17/2024, 10:22 AM [facility]

## 2024-07-17 NOTE — Progress Notes (Signed)
 RN notified [facility] cardiology PA regarding pt having black tarry stools, Per Dr. Wilburn continue with ASA + Plavix .

## 2024-07-17 NOTE — Progress Notes (Addendum)
 NAME:  [name], MRN:  [id number], DOB:  October 01, 1961, LOS: 6 ADMISSION DATE:  07/11/2024, CONSULTATION DATE: 07/12/24  Brief Pt Description / Synopsis:  63 y.o. male with PMHx significant for ETOH abuse, HFrEF, CAD with recent PCI, COPD, and GI Bleed initially admitted with Acute Hypoxic Respiratory Failure due to HCAP and COPD Exacerbation.  Hospital course complicated by Acute Metabolic Encephalopathy due to severe Alcohol  Withdrawal and DT's with acute aspiration event requiring intubation for airway protection.   History of Present Illness:  Patient is a 63 year old male who was admitted to the hospital on 7/25 after presenting with shortness of breath and chest discomfort.   He has a known history of severe aortic stenosis as well as coronary artery disease.  He is previously had CABG as well as PCI.  He also has underlying COPD, HFpEF, alcohol  use disorder, and GI bleed.   Patient was recently in the hospital on 7/16 - 7/17 for left heart cath with stenting to bypass graft (SVG to PDA).  He developed shortness of breath with cough productive of sputum over the past few days prompting presentation to the ED yesterday.   On arrival to the ED, his lower extremities were swollen and he was found to have a wheeze.  He was hypoxic requiring oxygen support via nasal cannula.  Given respiratory distress, he was started on treatment for COPD exacerbation and admitted to the hospitalist service for further management.  CT scan of the chest was negative for PE but did note some patchy nodular airspace disease as well as airway thickening.  He was started on broad-spectrum IV antibiotics, steroids, bronchodilators, and received IV fluids for sepsis given elevated lactic acid.   This morning, he had increased shortness of breath with respiratory distress prompting initiation of BiPAP and transferred to the ICU.  He was also started on dexmedetomidine  for agitation.   Blood work showed normal kidney  function, but with rising troponin and a significantly elevated BNP. Hemoglobin has been stable.   Pertinent  Medical History  CAD s/p CABG HFpEF Severe Aortic Stenosis ETOH use COPD on Trelegy  Micro Data:  07/25: COVID>>negative  07/25: Influenza A&B>>negative  07/25: RSV>>negative  07/25: MRSA PCR>>negative  07/25: Blood>>staph epidermidis in aerobic bottle only (likely a contaminant)  07/26: RVP>>negative  07/26: Strep pneumo & Legionella urinary antigens>> negative 7/30: Tracheal aspirate>> 7/31: Blood cultures>> 7/31: MRSA PCR>> negative  Anti-infectives (From admission, onward)    Start     Dose/Rate Route Frequency Ordered Stop   07/17/24 1000  piperacillin -tazobactam (ZOSYN ) IVPB 3.375 g        3.375 g 12.5 mL/hr over 240 Minutes Intravenous Every 8 hours 07/17/24 0811     07/17/24 1000  vancomycin  (VANCOREADY) IVPB 2000 mg/400 mL        2,000 mg 200 mL/hr over 120 Minutes Intravenous  Once 07/17/24 0811     07/14/24 1200  cefTRIAXone  (ROCEPHIN ) 2 g in sodium chloride  0.9 % 100 mL IVPB        2 g 200 mL/hr over 30 Minutes Intravenous Every 24 hours 07/14/24 1030 07/16/24 1204   07/12/24 1600  piperacillin -tazobactam (ZOSYN ) IVPB 3.375 g  Status:  Discontinued        3.375 g 12.5 mL/hr over 240 Minutes Intravenous Every 8 hours 07/12/24 1503 07/14/24 1030   07/12/24 0900  vancomycin  (VANCOREADY) IVPB 1250 mg/250 mL  Status:  Discontinued        1,250 mg 166.7 mL/hr over 90 Minutes Intravenous  Every 12 hours 07/11/24 2012 07/12/24 1458   07/11/24 2100  ceFEPIme  (MAXIPIME ) 2 g in sodium chloride  0.9 % 100 mL IVPB  Status:  Discontinued        2 g 200 mL/hr over 30 Minutes Intravenous Every 8 hours 07/11/24 1959 07/12/24 1458   07/11/24 2100  vancomycin  (VANCOREADY) IVPB 2000 mg/400 mL        2,000 mg 200 mL/hr over 120 Minutes Intravenous  Once 07/11/24 2004 07/11/24 2255   07/11/24 1945  cefTRIAXone  (ROCEPHIN ) 1 g in sodium chloride  0.9 % 100 mL IVPB  Status:   Discontinued        1 g 200 mL/hr over 30 Minutes Intravenous  Once 07/11/24 1930 07/11/24 1933   07/11/24 1945  azithromycin  (ZITHROMAX ) 500 mg in sodium chloride  0.9 % 250 mL IVPB  Status:  Discontinued        500 mg 250 mL/hr over 60 Minutes Intravenous  Once 07/11/24 1930 07/11/24 1955   07/11/24 1945  cefTRIAXone  (ROCEPHIN ) 2 g in sodium chloride  0.9 % 100 mL IVPB  Status:  Discontinued        2 g 200 mL/hr over 30 Minutes Intravenous  Once 07/11/24 1933 07/11/24 1955      Significant Hospital Events: Including procedures, antibiotic start and stop dates in addition to other pertinent events   7/25: admit with respiratory failure 7/26: clinically decompensated, worsening respiratory status initiated on BiPAP 7/27: on nasal cannula, reports anxiety and tremors consistent with EtOH withdrawal.  Received a total of 15 mg/kg of phenobarbital , however he remained agitated requiring low dose precedex  gtt.  7/28: Pt resting in bed no agitation on precedex  gtt @0 .5 mcg/min.  Orders placed to transfuse 1 unit of pRBC's hgb 7.2 goal hgb 8.0.  No signs of active bleeding  7/29: Pt remains encephalopathic with severe DT's requiring precedex  gtt.  NGT placed to initiate tube feeds and administer medications  7/30: Earlier this morning with vomiting, concern for aspiration event as now with increased WOB and increasing FiO2 requirements (up to 4L). Remains encephalopathic on Precedex .  Respiratory status continued to decline, progressed to agonal respirations, Required INTUBATION for airway protection.  Hypotensive with induction meds despite IV fluids and Levophed  resulting in brief loss of pulse (ROSC obtained after 2 minutes of CPR and 1 epi).  Central line placed due to vasopressors. 7/31: Overnight with episode on non-sustained SVT which converted spontaneously, remains on Amiodarone  gtt per Cardiology.  Nursing reported black tarry stools overnight, Hgb remains stable.   Levophed  weaned off, on  minimal vent settings, will perform WUA and SBT as mental status allows.  With fever of 101 F overnight and WBC nearly doubled, tracheal aspirate results pending, will check blood cultures and UA, broaden to empiric Vancomycin  and Zosyn  pending culture results.    Interim History / Subjective:  As outlined above under significant events    Objective    Blood pressure 117/72, pulse 87, temperature 98.8 F (37.1 C), temperature source Oral, resp. rate 17, height 6' (1.829 m), weight 98.6 kg, SpO2 97%. CVP:  [8 mmHg-16 mmHg] 9 mmHg  Vent Mode: PRVC FiO2 (%):  [30 %-100 %] 30 % Set Rate:  [20 bmp] 20 bmp Vt Set:  [550 mL] 550 mL PEEP:  [8 cmH20-10 cmH20] 8 cmH20 Plateau Pressure:  [14 cmH20-25 cmH20] 20 cmH20   Intake/Output Summary (Last 24 hours) at 07/17/2024 0815 Last data filed at 07/17/2024 0600 Gross per 24 hour  Intake 2864.76 ml  Output  600 ml  Net 2264.76 ml   Filed Weights   07/15/24 0358 07/16/24 0500 07/17/24 0500  Weight: 95.4 kg 97 kg 98.6 kg   Examination: General: Critically ill appearing male, laying in bed, intubated and sedated, in NAD Lungs: Coarse throughout, even, nonlabored, synchronous with the vent Cardiovascular: Regular rate and rhythm, s1s2, + murmur, 2+ radial/1+ distal pulses, no edema Abdomen: +BS x4, obese, soft, non distended, no guarding or rebound tenderness Extremities: Normal bulk and tone, no deformities, no edema Neuro: Sedated, withdraws from pain, unable to follow commands currently, pupils PERRL GU: External catheter in place  Resolved problem list   Assessment and Plan   #Acute toxic metabolic encephalopathy  #ETOH withdrawal  #Sedation needs in setting of mechanical ventilation -Treatment of metabolic derangements and withdrawal as outlined below -Maintain a RASS goal of 0 to -1 -Fentanyl  and Propofol  as needed to maintain RASS goal -Avoid sedating medications as able -Daily wake up assessment -CIWA protocol -Discussed with Dr.  Malka, will stop Phenobarbital  and Valium   -Continue thiamine , mvi, and folic acid   -Once mentation improves will need ETOH cessation counseling   #Brief Cardiac Arrest due to severe Hypotension from induction meds (ROSC obtained after 2 minutes of CPR and 1 epi) #Hypotension: suspect sedation related vs developing Septic shock ~ RESOLVED #Severe aortic stenosis  #Elevated troponin's secondary to NSTEMI  #Acute on Chronic HFpEF  Hx: HTN, MI, CAD, and stroke  -Echocardiogram 05/10/24: LVEF 60-65%, grade I DD, RV systolic function and size normal, severe Aortic Stenosis -Cardiac Cath 07/02/24: Severe native 3 vessel disease, successful stenting of distal SVG to PDA with DES -Continuous cardiac monitoring -Maintain MAP >65 -Vasopressors as needed to maintain MAP goal ~ weaned off -Lactic acid has normalized -HS Troponin peaked at 5.766 -Diuresis as BP and renal function permits ~ Lasix  d/c on 7/30 due to hypotension - Cardiology following, appreciate input ~ pt to follow-up outpatient to discuss aortic valve repair/replacement options.  Goal is to optimize him regarding volume and symptom standpoint prior to discharge  - Continue aspirin , plavix , and rosuvastatin   - Completed 24hrs of heparin  gtt  -Continue Amiodarone  gtt -Hold Metoprolol  for now  #Acute on chronic hypoxic respiratory failure  #AECOPD #Aspiration pneumonia #Intubated for airway protection -Full vent support, implement lung protective strategies -Plateau pressures less than 30 cm H20 -Wean FiO2 & PEEP as tolerated to maintain O2 sats 88 to 92% -Follow intermittent Chest X-ray & ABG as needed -Spontaneous Breathing Trials when respiratory parameters met and mental status permits -Implement VAP Bundle -Prn Bronchodilators -ABX as above  #Aspiration pneumonia  #Worsening Leukocytosis with Fever 7/31 -Monitor fever curve -Trend WBC's & Procalcitonin -Follow cultures as above -Broaden to empiric Vancomycin  and Zosyn   for now pending cultures & sensitivities  #AKI -Monitor I&O's / urinary output -Follow BMP -Ensure adequate renal perfusion -Avoid nephrotoxic agents as able -Replace electrolytes as indicated ~ Pharmacy following for assistance with electrolyte replacement  #Anemia without signs of bleeding  #Possible GI bleed  Hx: AVM's undergone multiple bidrectional endoscopies without findings of bleeding within the last 2 yrs  - Trend CBC  - Monitor for s/sx of bleeding  - Transfuse for hgb <8  - Continue scheduled niferex  - GI consulted appreciate input: due to no signs of active bleeding no indications for interventions at this time signed off   -Continue Protonix  BID  #Hyperglycemia, likely steroid induced  -CBG's q4h; Target range of 140 to 180 -SSI -Follow ICU Hypo/Hyperglycemia protocol -IV steroids discontinued  Best Practice (right click and Reselect all SmartList Selections daily)   Diet/type: NPO; Tube feeds DVT prophylaxis Lovenox  Pressure ulcer(s): N/A GI prophylaxis: PPI Lines: Left internal jugular CVC placed 7/39, and is still needed Foley:  N/A Code Status:  full code Last date of multidisciplinary goals of care discussion [07/17/24]   7/31: No accurate emergency contacted information listed in pts chart. Updated patients best friend's wife Bari Solo at bedside (she reports patient is estranged from his children).  Labs   CBC: Recent Labs  Lab 07/11/24 1706 07/11/24 2137 07/13/24 2329 07/14/24 0645 07/14/24 1632 07/15/24 0307 07/16/24 0334 07/17/24 0222  WBC 8.6   < > 10.1 8.8  --  12.4* 12.6* 31.2*  NEUTROABS 4.9  --   --   --   --   --   --   --   HGB 8.3*   < > 7.1* 7.2* 8.7* 8.6* 8.6* 8.9*  HCT 26.7*   < > 23.2* 22.8* 27.5* 27.8* 28.9* 29.2*  MCV 86.1   < > 85.6 86.4  --  86.9 88.9 88.5  PLT 339   < > 244 232  --  227 234 262   < > = values in this interval not displayed.    Basic Metabolic Panel: Recent Labs  Lab 07/14/24 0535  07/15/24 0307 07/16/24 0334 07/16/24 1608 07/16/24 1611 07/17/24 0222  NA 138 139 141  --  145 143  K 4.0 3.7 3.2*  --  3.5 3.9  CL 97* 102 104  --  107 105  CO2 28 28 28   --  26 27  GLUCOSE 133* 117* 129*  --  173* 129*  BUN 28* 25* 29*  --  33* 34*  CREATININE 1.13 0.92 0.99  --  1.27* 1.38*  CALCIUM  8.3* 8.2* 8.1*  --  7.5* 7.9*  MG 2.2 2.4 2.6* 2.4  --  2.6*  PHOS  --  2.7 2.7  --   --  3.1   GFR: Estimated Creatinine Clearance: 64.9 mL/min (A) (by C-G formula based on SCr of 1.38 mg/dL (H)). Recent Labs  Lab 07/11/24 2137 07/12/24 0215 07/12/24 0900 07/12/24 1439 07/14/24 0645 07/15/24 0307 07/16/24 0334 07/16/24 1607 07/16/24 1928 07/17/24 0222  WBC 6.2   < > 8.2   < > 8.8 12.4* 12.6*  --   --  31.2*  LATICACIDVEN 5.9*  --  3.4*  --   --   --   --  0.8 1.2  --    < > = values in this interval not displayed.    Liver Function Tests: Recent Labs  Lab 07/11/24 1706 07/12/24 0900 07/14/24 0535 07/17/24 0222  AST 21 62* 41  --   ALT 16 18 18   --   ALKPHOS 43 45 31*  --   BILITOT 0.9 0.9 1.3*  --   PROT 7.3 7.3 6.6  --   ALBUMIN 3.5 3.5 3.2* 2.9*   No results for input(s): LIPASE, AMYLASE in the last 168 hours. Recent Labs  Lab 07/14/24 0535  AMMONIA 21    ABG    Component Value Date/Time   PHART 7.34 (L) 07/16/2024 1151   PCO2ART 53 (H) 07/16/2024 1151   PO2ART 271 (H) 07/16/2024 1151   HCO3 28.6 (H) 07/16/2024 1151   ACIDBASEDEF 0.8 07/16/2024 0915   O2SAT 78.5 07/16/2024 1605     Coagulation Profile: Recent Labs  Lab 07/11/24 1706  INR 1.1    Cardiac Enzymes: No results for input(s):  CKTOTAL, CKMB, CKMBINDEX, TROPONINI in the last 168 hours.  HbA1C: Hemoglobin A1C  Date/Time Value Ref Range Status  12/02/2013 03:52 PM 5.3 4.2 - 6.3 % Final    Comment:    The American Diabetes Association recommends that a primary goal of therapy should be <7% and that physicians should reevaluate the treatment regimen in patients with  HbA1c values consistently >8%.    Hgb A1c MFr Bld  Date/Time Value Ref Range Status  10/28/2022 04:37 AM 5.3 4.8 - 5.6 % Final    Comment:    (NOTE) Pre diabetes:          5.7%-6.4%  Diabetes:              >6.4%  Glycemic control for   <7.0% adults with diabetes     CBG: Recent Labs  Lab 07/16/24 1629 07/16/24 1931 07/16/24 2320 07/17/24 0312 07/17/24 0740  GLUCAP 161* 169* 138* 110* 140*    Review of Systems:   Unable to assess pt sedated/AMS/Critical illness  Past Medical History:  He,  has a past medical history of COPD (chronic obstructive pulmonary disease) (HCC), CVA (cerebral infarction), Headache, Hypertension, MI (myocardial infarction) (HCC) (05/09/2012), Reading difficulty, Stroke (HCC) (2012), and Wears dentures.   Surgical History:   Past Surgical History:  Procedure Laterality Date   CAROTID ENDARTERECTOMY Left    2012 or 2013   CHOLECYSTECTOMY N/A 07/26/2016   Procedure: LAPAROSCOPIC CHOLECYSTECTOMY;  Surgeon: Charlie FORBES Fell, MD;  Location: [facility] ORS;  Service: General;  Laterality: N/A;   COLONOSCOPY N/A 05/08/2024   Procedure: COLONOSCOPY;  Surgeon: Jinny Carmine, MD;  Location: [facility] ENDOSCOPY;  Service: Endoscopy;  Laterality: N/A;   COLONOSCOPY WITH PROPOFOL  N/A 02/09/2022   Procedure: COLONOSCOPY WITH PROPOFOL ;  Surgeon: Jinny Carmine, MD;  Location: [facility] ENDOSCOPY;  Service: Endoscopy;  Laterality: N/A;   CORONARY ARTERY BYPASS GRAFT  05/10/2012   3 vessel   CORONARY STENT INTERVENTION N/A 07/02/2024   Procedure: CORONARY STENT INTERVENTION;  Surgeon: Katina Albright, MD;  Location: [facility] INVASIVE CV LAB;  Service: Cardiovascular;  Laterality: N/A;   CORONARY ULTRASOUND/IVUS N/A 07/02/2024   Procedure: Coronary Ultrasound/IVUS;  Surgeon: Katina Albright, MD;  Location: [facility] INVASIVE CV LAB;  Service: Cardiovascular;  Laterality: N/A;   ENTEROSCOPY N/A 05/16/2023   Procedure: ENTEROSCOPY;  Surgeon: Therisa Bi, MD;  Location: [facility] ENDOSCOPY;  Service:  Gastroenterology;  Laterality: N/A;   ENTEROSCOPY N/A 06/01/2023   Procedure: ENTEROSCOPY;  Surgeon: Unk Corinn Skiff, MD;  Location: [facility] ENDOSCOPY;  Service: Gastroenterology;  Laterality: N/A;   ESOPHAGOGASTRODUODENOSCOPY  05/13/2023   Procedure: ESOPHAGOGASTRODUODENOSCOPY (EGD);  Surgeon: Jinny Carmine, MD;  Location: [facility] ENDOSCOPY;  Service: Endoscopy;;   ESOPHAGOGASTRODUODENOSCOPY (EGD) WITH PROPOFOL  N/A 10/28/2022   Procedure: ESOPHAGOGASTRODUODENOSCOPY (EGD) WITH PROPOFOL ;  Surgeon: Onita Elspeth Sharper, DO;  Location: [facility] ENDOSCOPY;  Service: Gastroenterology;  Laterality: N/A;   GIVENS CAPSULE STUDY  05/13/2023   Procedure: GIVENS CAPSULE STUDY;  Surgeon: Jinny Carmine, MD;  Location: [facility] ENDOSCOPY;  Service: Endoscopy;;   GIVENS CAPSULE STUDY  06/01/2023   Procedure: GIVENS CAPSULE STUDY;  Surgeon: Unk Corinn Skiff, MD;  Location: [facility] ENDOSCOPY;  Service: Gastroenterology;;   LEFT HEART CATH AND CORS/GRAFTS ANGIOGRAPHY N/A 07/02/2024   Procedure: LEFT HEART CATH AND CORS/GRAFTS ANGIOGRAPHY;  Surgeon: Katina Albright, MD;  Location: [facility] INVASIVE CV LAB;  Service: Cardiovascular;  Laterality: N/A;     Social History:   reports that he quit smoking about 32 years ago. His smoking use included cigarettes. His smokeless tobacco use  includes snuff. He reports current alcohol  use of about 26.0 standard drinks of alcohol  per week. He reports that he does not use drugs.   Family History:  His family history includes Pneumonia in his mother.   Allergies Allergies  Allergen Reactions   Wellbutrin  [Bupropion ]     Paranoia      Home Medications  Prior to Admission medications   Medication Sig Start Date End Date Taking? Authorizing Provider  albuterol  (VENTOLIN  HFA) 108 (90 Base) MCG/ACT inhaler Inhale 2 puffs into the lungs every 6 (six) hours as needed for wheezing or shortness of breath. 07/03/24  Yes Von Bellis, MD  ascorbic acid  (VITAMIN C ) 500 MG tablet Take 1 tablet (500 mg  total) by mouth daily. 07/03/24 10/01/24 Yes Von Bellis, MD  aspirin  EC 81 MG tablet Take 1 tablet (81 mg total) by mouth daily. Swallow whole. 07/03/24 12/30/24 Yes Von Bellis, MD  clopidogrel  (PLAVIX ) 75 MG tablet Take 1 tablet (75 mg total) by mouth daily with breakfast. 07/03/24  Yes Von Bellis, MD  dapagliflozin  propanediol (FARXIGA ) 10 MG TABS tablet Take 1 tablet (10 mg total) by mouth daily. 07/03/24  Yes Von Bellis, MD  folic acid  (FOLVITE ) 1 MG tablet Take 1 tablet (1 mg total) by mouth daily. 07/03/24 10/01/24 Yes Von Bellis, MD  iron  polysaccharides (NIFEREX) 150 MG capsule Take 1 capsule (150 mg total) by mouth daily. 07/03/24 10/01/24 Yes Von Bellis, MD  isosorbide  mononitrate (IMDUR ) 60 MG 24 hr tablet Take 1 tablet (60 mg total) by mouth daily. 07/03/24 07/03/25 Yes Von Bellis, MD  losartan  (COZAAR ) 25 MG tablet Take 0.5 tablets (12.5 mg total) by mouth daily. 07/04/24 07/04/25 Yes Von Bellis, MD  metoprolol  succinate (TOPROL -XL) 25 MG 24 hr tablet Take 0.5 tablets (12.5 mg total) by mouth daily. 07/03/24  Yes Von Bellis, MD  pantoprazole  (PROTONIX ) 40 MG tablet Take 1 tablet (40 mg total) by mouth daily. 07/03/24 10/01/24 Yes Von Bellis, MD  pravastatin  (PRAVACHOL ) 20 MG tablet Take 1 tablet (20 mg total) by mouth at bedtime. 07/03/24 07/03/25 Yes Von Bellis, MD  spironolactone  (ALDACTONE ) 25 MG tablet Take 0.5 tablets (12.5 mg total) by mouth daily. 07/03/24  Yes Von Bellis, MD  torsemide  (DEMADEX ) 10 MG tablet Take 1 tablet (10 mg total) by mouth daily. 07/03/24  Yes Von Bellis, MD     Critical care time: 40 minutes       Inge Lecher, AGACNP-BC  Pulmonary & Critical Care Prefer epic messenger for cross cover needs If after hours, please call E-link

## 2024-07-18 ENCOUNTER — Inpatient Hospital Stay

## 2024-07-18 DIAGNOSIS — I469 Cardiac arrest, cause unspecified: Secondary | ICD-10-CM | POA: Diagnosis not present

## 2024-07-18 DIAGNOSIS — G928 Other toxic encephalopathy: Secondary | ICD-10-CM | POA: Diagnosis not present

## 2024-07-18 LAB — CBC
HCT: 27.6 % — ABNORMAL LOW (ref 39.0–52.0)
Hemoglobin: 8.3 g/dL — ABNORMAL LOW (ref 13.0–17.0)
MCH: 27.1 pg (ref 26.0–34.0)
MCHC: 30.1 g/dL (ref 30.0–36.0)
MCV: 90.2 fL (ref 80.0–100.0)
Platelets: 213 K/uL (ref 150–400)
RBC: 3.06 MIL/uL — ABNORMAL LOW (ref 4.22–5.81)
RDW: 16.1 % — ABNORMAL HIGH (ref 11.5–15.5)
WBC: 17.2 K/uL — ABNORMAL HIGH (ref 4.0–10.5)
nRBC: 0 % (ref 0.0–0.2)

## 2024-07-18 LAB — BLOOD GAS, ARTERIAL
Acid-Base Excess: 6.8 mmol/L — ABNORMAL HIGH (ref 0.0–2.0)
Bicarbonate: 31.3 mmol/L — ABNORMAL HIGH (ref 20.0–28.0)
FIO2: 35 %
MECHVT: 550 mL
Mechanical Rate: 20
O2 Saturation: 98.2 %
PEEP: 5 cmH2O
Patient temperature: 37
pCO2 arterial: 43 mmHg (ref 32–48)
pH, Arterial: 7.47 — ABNORMAL HIGH (ref 7.35–7.45)
pO2, Arterial: 86 mmHg (ref 83–108)

## 2024-07-18 LAB — RENAL FUNCTION PANEL
Albumin: 2.6 g/dL — ABNORMAL LOW (ref 3.5–5.0)
Anion gap: 7 (ref 5–15)
BUN: 24 mg/dL — ABNORMAL HIGH (ref 8–23)
CO2: 28 mmol/L (ref 22–32)
Calcium: 7.8 mg/dL — ABNORMAL LOW (ref 8.9–10.3)
Chloride: 108 mmol/L (ref 98–111)
Creatinine, Ser: 1.04 mg/dL (ref 0.61–1.24)
GFR, Estimated: >60 mL/min (ref >60)
Glucose, Bld: 140 mg/dL — ABNORMAL HIGH (ref 70–99)
Phosphorus: 2.4 mg/dL — ABNORMAL LOW (ref 2.5–4.6)
Potassium: 3.1 mmol/L — ABNORMAL LOW (ref 3.5–5.1)
Sodium: 143 mmol/L (ref 135–145)

## 2024-07-18 LAB — TYPE AND SCREEN
ABO/RH(D): A POS
Antibody Screen: NEGATIVE
Unit division: 0

## 2024-07-18 LAB — HEMOGLOBIN AND HEMATOCRIT, BLOOD
HCT: 29 % — ABNORMAL LOW (ref 39.0–52.0)
HCT: 28.4 % — ABNORMAL LOW (ref 39.0–52.0)
HCT: 30.4 % — ABNORMAL LOW (ref 39.0–52.0)
Hemoglobin: 8.8 g/dL — ABNORMAL LOW (ref 13.0–17.0)
Hemoglobin: 8.6 g/dL — ABNORMAL LOW (ref 13.0–17.0)
Hemoglobin: 9.1 g/dL — ABNORMAL LOW (ref 13.0–17.0)

## 2024-07-18 LAB — BPAM RBC
Blood Product Expiration Date: 202508312359
ISSUE DATE / TIME: 202507312113
Unit Type and Rh: 6200

## 2024-07-18 LAB — GLUCOSE, CAPILLARY
Glucose-Capillary: 119 mg/dL — ABNORMAL HIGH (ref 70–99)
Glucose-Capillary: 120 mg/dL — ABNORMAL HIGH (ref 70–99)
Glucose-Capillary: 130 mg/dL — ABNORMAL HIGH (ref 70–99)
Glucose-Capillary: 133 mg/dL — ABNORMAL HIGH (ref 70–99)
Glucose-Capillary: 137 mg/dL — ABNORMAL HIGH (ref 70–99)
Glucose-Capillary: 155 mg/dL — ABNORMAL HIGH (ref 70–99)

## 2024-07-18 LAB — MAGNESIUM: Magnesium: 2.4 mg/dL (ref 1.7–2.4)

## 2024-07-18 MED ORDER — AMIODARONE HCL 200 MG PO TABS: 200.0000 mg | ORAL_TABLET | Freq: Every day | ORAL | Status: DC

## 2024-07-18 MED ORDER — POTASSIUM & SODIUM PHOSPHATES 280-160-250 MG PO PACK
1.0000 | PACK | Freq: Three times a day (TID) | ORAL | Status: AC
  Administered 2024-07-18 (×3): 1
  Filled 2024-07-18 (×3): qty 1

## 2024-07-18 MED ORDER — POTASSIUM CHLORIDE 20 MEQ PO PACK: 20.0000 meq | PACK | ORAL | Status: DC

## 2024-07-18 MED ORDER — IPRATROPIUM-ALBUTEROL 0.5-2.5 (3) MG/3ML IN SOLN
3.0000 mL | Freq: Four times a day (QID) | RESPIRATORY_TRACT | Status: DC
  Administered 2024-07-18 – 2024-07-23 (×19): 3 mL via RESPIRATORY_TRACT
  Filled 2024-07-18 (×19): qty 3

## 2024-07-18 MED ORDER — AMIODARONE HCL 200 MG PO TABS
400.0000 mg | ORAL_TABLET | Freq: Two times a day (BID) | ORAL | Status: DC
  Administered 2024-07-18 – 2024-07-23 (×12): 400 mg
  Filled 2024-07-18 (×12): qty 2

## 2024-07-18 MED ORDER — FUROSEMIDE 10 MG/ML IJ SOLN: 40.0000 mg | Freq: Once | INTRAMUSCULAR | Status: DC

## 2024-07-18 MED ORDER — FUROSEMIDE 10 MG/ML IJ SOLN
20.0000 mg | Freq: Once | INTRAMUSCULAR | Status: AC
  Administered 2024-07-18: 20 mg via INTRAVENOUS
  Filled 2024-07-18: qty 2

## 2024-07-18 MED ORDER — IPRATROPIUM-ALBUTEROL 0.5-2.5 (3) MG/3ML IN SOLN
RESPIRATORY_TRACT | Status: AC
  Administered 2024-07-18: 3 mL via RESPIRATORY_TRACT
  Filled 2024-07-18: qty 3

## 2024-07-18 MED ORDER — POTASSIUM CHLORIDE 20 MEQ PO PACK
40.0000 meq | PACK | Freq: Once | ORAL | Status: AC
  Administered 2024-07-18: 40 meq
  Filled 2024-07-18: qty 2

## 2024-07-18 MED ORDER — AMIODARONE HCL 200 MG PO TABS: 400.0000 mg | ORAL_TABLET | Freq: Two times a day (BID) | ORAL | Status: DC

## 2024-07-18 MED ORDER — ORAL CARE MOUTH RINSE
15.0000 mL | OROMUCOSAL | Status: DC
  Administered 2024-07-18 – 2024-07-31 (×60): 15 mL via OROMUCOSAL

## 2024-07-18 MED ORDER — ORAL CARE MOUTH RINSE: 15.0000 mL | OROMUCOSAL | Status: DC | PRN

## 2024-07-18 MED ORDER — AMIODARONE HCL IN DEXTROSE 360-4.14 MG/200ML-% IV SOLN
30.0000 mg/h | INTRAVENOUS | Status: AC
  Administered 2024-07-18: 30 mg/h via INTRAVENOUS
  Filled 2024-07-18: qty 200

## 2024-07-18 MED ORDER — POTASSIUM CHLORIDE 10 MEQ/50ML IV SOLN
10.0000 meq | INTRAVENOUS | Status: AC
  Administered 2024-07-18 (×4): 10 meq via INTRAVENOUS
  Filled 2024-07-18 (×4): qty 50

## 2024-07-18 MED ORDER — SODIUM CHLORIDE 3 % IN NEBU
4.0000 mL | INHALATION_SOLUTION | Freq: Two times a day (BID) | RESPIRATORY_TRACT | Status: AC
  Administered 2024-07-18 – 2024-07-20 (×6): 4 mL via RESPIRATORY_TRACT
  Filled 2024-07-18 (×6): qty 4

## 2024-07-18 NOTE — Plan of Care (Signed)
?  Problem: Clinical Measurements: ?Goal: Will remain free from infection ?Outcome: Progressing ?Goal: Diagnostic test results will improve ?Outcome: Progressing ?Goal: Respiratory complications will improve ?Outcome: Progressing ?  ?

## 2024-07-18 NOTE — Progress Notes (Signed)
 NAME:  [name], MRN:  [id number], DOB:  03-19-1961, LOS: 7 ADMISSION DATE:  07/11/2024, CONSULTATION DATE: 07/12/24  Brief Pt Description / Synopsis:  63 y.o. male with PMHx significant for ETOH abuse, HFrEF, CAD with recent PCI, COPD, and GI Bleed initially admitted with Acute Hypoxic Respiratory Failure due to HCAP and COPD Exacerbation.  Hospital course complicated by Acute Metabolic Encephalopathy due to severe Alcohol  Withdrawal and DT's with acute aspiration event requiring intubation for airway protection.   History of Present Illness:  Patient is a 63 year old male who was admitted to the hospital on 7/25 after presenting with shortness of breath and chest discomfort.   He has a known history of severe aortic stenosis as well as coronary artery disease.  He is previously had CABG as well as PCI.  He also has underlying COPD, HFpEF, alcohol  use disorder, and GI bleed.   Patient was recently in the hospital on 7/16 - 7/17 for left heart cath with stenting to bypass graft (SVG to PDA).  He developed shortness of breath with cough productive of sputum over the past few days prompting presentation to the ED yesterday.   On arrival to the ED, his lower extremities were swollen and he was found to have a wheeze.  He was hypoxic requiring oxygen support via nasal cannula.  Given respiratory distress, he was started on treatment for COPD exacerbation and admitted to the hospitalist service for further management.  CT scan of the chest was negative for PE but did note some patchy nodular airspace disease as well as airway thickening.  He was started on broad-spectrum IV antibiotics, steroids, bronchodilators, and received IV fluids for sepsis given elevated lactic acid.   This morning, he had increased shortness of breath with respiratory distress prompting initiation of BiPAP and transferred to the ICU.  He was also started on dexmedetomidine  for agitation.   Blood work showed normal kidney  function, but with rising troponin and a significantly elevated BNP. Hemoglobin has been stable.   Pertinent  Medical History  CAD s/p CABG HFpEF Severe Aortic Stenosis ETOH use COPD on Trelegy  Micro Data:  07/25: COVID>>negative  07/25: Influenza A&B>>negative  07/25: RSV>>negative  07/25: MRSA PCR>>negative  07/25: Blood>>staph epidermidis in aerobic bottle only (likely a contaminant)  07/26: RVP>>negative  07/26: Strep pneumo & Legionella urinary antigens>> negative 7/30: Tracheal aspirate>> 7/31: Blood cultures>> 7/31: MRSA PCR>> negative  Anti-infectives (From admission, onward)    Start     Dose/Rate Route Frequency Ordered Stop   07/17/24 1000  piperacillin -tazobactam (ZOSYN ) IVPB 3.375 g        3.375 g 12.5 mL/hr over 240 Minutes Intravenous Every 8 hours 07/17/24 0811     07/17/24 1000  vancomycin  (VANCOREADY) IVPB 2000 mg/400 mL  Status:  Discontinued        2,000 mg 200 mL/hr over 120 Minutes Intravenous  Once 07/17/24 0811 07/17/24 1026   07/14/24 1200  cefTRIAXone  (ROCEPHIN ) 2 g in sodium chloride  0.9 % 100 mL IVPB        2 g 200 mL/hr over 30 Minutes Intravenous Every 24 hours 07/14/24 1030 07/16/24 1204   07/12/24 1600  piperacillin -tazobactam (ZOSYN ) IVPB 3.375 g  Status:  Discontinued        3.375 g 12.5 mL/hr over 240 Minutes Intravenous Every 8 hours 07/12/24 1503 07/14/24 1030   07/12/24 0900  vancomycin  (VANCOREADY) IVPB 1250 mg/250 mL  Status:  Discontinued        1,250 mg 166.7 mL/hr  over 90 Minutes Intravenous Every 12 hours 07/11/24 2012 07/12/24 1458   07/11/24 2100  ceFEPIme  (MAXIPIME ) 2 g in sodium chloride  0.9 % 100 mL IVPB  Status:  Discontinued        2 g 200 mL/hr over 30 Minutes Intravenous Every 8 hours 07/11/24 1959 07/12/24 1458   07/11/24 2100  vancomycin  (VANCOREADY) IVPB 2000 mg/400 mL        2,000 mg 200 mL/hr over 120 Minutes Intravenous  Once 07/11/24 2004 07/11/24 2255   07/11/24 1945  cefTRIAXone  (ROCEPHIN ) 1 g in sodium  chloride 0.9 % 100 mL IVPB  Status:  Discontinued        1 g 200 mL/hr over 30 Minutes Intravenous  Once 07/11/24 1930 07/11/24 1933   07/11/24 1945  azithromycin  (ZITHROMAX ) 500 mg in sodium chloride  0.9 % 250 mL IVPB  Status:  Discontinued        500 mg 250 mL/hr over 60 Minutes Intravenous  Once 07/11/24 1930 07/11/24 1955   07/11/24 1945  cefTRIAXone  (ROCEPHIN ) 2 g in sodium chloride  0.9 % 100 mL IVPB  Status:  Discontinued        2 g 200 mL/hr over 30 Minutes Intravenous  Once 07/11/24 1933 07/11/24 1955      Significant Hospital Events: Including procedures, antibiotic start and stop dates in addition to other pertinent events   7/25: admit with respiratory failure 7/26: clinically decompensated, worsening respiratory status initiated on BiPAP 7/27: on nasal cannula, reports anxiety and tremors consistent with EtOH withdrawal.  Received a total of 15 mg/kg of phenobarbital , however he remained agitated requiring low dose precedex  gtt.  7/28: Pt resting in bed no agitation on precedex  gtt @0 .5 mcg/min.  Orders placed to transfuse 1 unit of pRBC's hgb 7.2 goal hgb 8.0.  No signs of active bleeding  7/29: Pt remains encephalopathic with severe DT's requiring precedex  gtt.  NGT placed to initiate tube feeds and administer medications  7/30: Earlier this morning with vomiting, concern for aspiration event as now with increased WOB and increasing FiO2 requirements (up to 4L). Remains encephalopathic on Precedex .  Respiratory status continued to decline, progressed to agonal respirations, Required INTUBATION for airway protection.  Hypotensive with induction meds despite IV fluids and Levophed  resulting in brief loss of pulse (ROSC obtained after 2 minutes of CPR and 1 epi).  Central line placed due to vasopressors. 7/31: Overnight with episode on non-sustained SVT which converted spontaneously, remains on Amiodarone  gtt per Cardiology.  Nursing reported black tarry stools overnight, Hgb remains  stable.   Levophed  weaned off, on minimal vent settings, will perform WUA and SBT as mental status allows.  With fever of 101 F overnight and WBC nearly doubled, tracheal aspirate results pending, will check blood cultures and UA, broaden to empiric Vancomycin  and Zosyn  pending culture results.   08/01:  No significant events reported overnight. Received 1 unit pRBC's overnight for Hgb 7.5 with appropriate response to 8.8 and has since remained stable with no reports of bleeding from nursing.  Fevers resolved, WBC improved with broadening to Zosyn  yesterday, cultures currently with no growth.  Weaned off levophed .  On minimal vent support, passed WUA and SBT, EXTUBATED TO HHFNC.  Diuresed with 20 mg IV Lasix  x1 dose with extubation, needs aggressive pulmonary toilet.  Interim History / Subjective:  As outlined above under significant events    Objective    Blood pressure (!) 109/55, pulse 68, temperature 98.8 F (37.1 C), temperature source Oral, resp. rate 20,  height 6' (1.829 m), weight 101.1 kg, SpO2 95%. CVP:  [5 mmHg-12 mmHg] 5 mmHg  Vent Mode: PRVC FiO2 (%):  [30 %] 30 % Set Rate:  [20 bmp] 20 bmp Vt Set:  [550 mL] 550 mL PEEP:  [5 cmH20] 5 cmH20 Plateau Pressure:  [11 cmH20-19 cmH20] 16 cmH20   Intake/Output Summary (Last 24 hours) at 07/18/2024 0809 Last data filed at 07/18/2024 0600 Gross per 24 hour  Intake 2763.29 ml  Output 1460 ml  Net 1303.29 ml   Filed Weights   07/16/24 0500 07/17/24 0500 07/18/24 0500  Weight: 97 kg 98.6 kg 101.1 kg   Examination: General: Critically ill appearing male, laying in bed, intubated and sedated, in NAD Lungs: Coarse throughout, even, nonlabored, synchronous with the vent Cardiovascular: Regular rate and rhythm, s1s2, + murmur, 2+ radial/1+ distal pulses, no edema Abdomen: +BS x4, obese, soft, non distended, no guarding or rebound tenderness Extremities: Normal bulk and tone, no deformities, no edema Neuro: Lightly Sedated, withdraws from  pain, able to follow commands currently, pupils PERRL GU: Foley catheter in place  Resolved problem list   Assessment and Plan   #Acute toxic metabolic encephalopathy  #ETOH withdrawal  #Sedation needs in setting of mechanical ventilation -Treatment of metabolic derangements and withdrawal as outlined below -Maintain a RASS goal of 0 to -1 -Fentanyl  and Propofol  as needed to maintain RASS goal -Avoid sedating medications as able -Daily wake up assessment -CIWA protocol -Discussed with Dr. Malka, will stop Phenobarbital  and Valium   -Continue thiamine , mvi, and folic acid   -Once mentation improves will need ETOH cessation counseling   #Brief Cardiac Arrest due to severe Hypotension from induction meds (ROSC obtained after 2 minutes of CPR and 1 epi) #Hypotension: suspect sedation related vs developing Septic shock ~ RESOLVED #Severe aortic stenosis  #Elevated troponin's secondary to NSTEMI  #Acute on Chronic HFpEF  Hx: HTN, MI, CAD, and stroke  -Echocardiogram 05/10/24: LVEF 60-65%, grade I DD, RV systolic function and size normal, severe Aortic Stenosis -Cardiac Cath 07/02/24: Severe native 3 vessel disease, successful stenting of distal SVG to PDA with DES -Continuous cardiac monitoring -Maintain MAP >65 -Vasopressors as needed to maintain MAP goal ~ weaned off -Lactic acid has normalized -HS Troponin peaked at 5.766 -Diuresis as BP and renal function permits ~ given 20 mg IV Lasix  x1 dose on 8/1 with extubation - Cardiology following, appreciate input ~ pt to follow-up outpatient to discuss aortic valve repair/replacement options.  Goal is to optimize him regarding volume and symptom standpoint prior to discharge  - Continue aspirin , plavix , and rosuvastatin   - Completed 24hrs of heparin  gtt  -Amiodarone  gtt transitioned to PO Amiodarone  on 8/1 -Hold Metoprolol  for now  #Acute on chronic hypoxic respiratory failure  #AECOPD #Aspiration pneumonia #Intubated for airway  protection -Full vent support, implement lung protective strategies -Plateau pressures less than 30 cm H20 -Wean FiO2 & PEEP as tolerated to maintain O2 sats 88 to 92% -Follow intermittent Chest X-ray & ABG as needed -Spontaneous Breathing Trials when respiratory parameters met and mental status permits -Implement VAP Bundle -Prn Bronchodilators -ABX as above  #Aspiration pneumonia  #Worsening Leukocytosis with Fever 7/31 ~ IMPROVED -Monitor fever curve -Trend WBC's & Procalcitonin -Follow cultures as above -Continue empiric  Zosyn  for now pending cultures & sensitivities  #AKI -Monitor I&O's / urinary output -Follow BMP -Ensure adequate renal perfusion -Avoid nephrotoxic agents as able -Replace electrolytes as indicated ~ Pharmacy following for assistance with electrolyte replacement  #Anemia without signs of bleeding  #  Possible GI bleed  Hx: AVM's undergone multiple bidrectional endoscopies without findings of bleeding within the last 2 yrs  - Trend CBC  - Monitor for s/sx of bleeding  - Transfuse for hgb <8  (received 1 unit pRBC on 7/31) - Continue scheduled niferex  - GI consulted appreciate input: due to no signs of active bleeding no indications for interventions at this time signed off   -Continue Protonix  BID  #Hyperglycemia, likely steroid induced  -CBG's q4h; Target range of 140 to 180 -SSI -Follow ICU Hypo/Hyperglycemia protocol -IV steroids discontinued     Best Practice (right click and Reselect all SmartList Selections daily)   Diet/type: NPO; Tube feeds via NGT until passes speech evaluation DVT prophylaxis Lovenox  Pressure ulcer(s): N/A GI prophylaxis: PPI Lines: Left internal jugular CVC placed 7/39, and is still needed Foley:  yes and is still needed Code Status:  full code Last date of multidisciplinary goals of care discussion [07/18/24]   8/1: Updated pt at bedside on plan of care.  Labs   CBC: Recent Labs  Lab 07/11/24 1706  07/11/24 2137 07/14/24 0645 07/14/24 1632 07/15/24 9692 07/16/24 0334 07/17/24 0222 07/17/24 0802 07/17/24 1733 07/18/24 0035 07/18/24 0450 07/18/24 0621  WBC 8.6   < > 8.8  --  12.4* 12.6* 31.2*  --   --   --  17.2*  --   NEUTROABS 4.9  --   --   --   --   --   --   --   --   --   --   --   HGB 8.3*   < > 7.2*   < > 8.6* 8.6* 8.9* 8.5* 7.5* 8.8* 8.3* 8.6*  HCT 26.7*   < > 22.8*   < > 27.8* 28.9* 29.2* 28.6* 25.3* 29.0* 27.6* 28.4*  MCV 86.1   < > 86.4  --  86.9 88.9 88.5  --   --   --  90.2  --   PLT 339   < > 232  --  227 234 262  --   --   --  213  --    < > = values in this interval not displayed.    Basic Metabolic Panel: Recent Labs  Lab 07/15/24 0307 07/16/24 0334 07/16/24 1608 07/16/24 1611 07/17/24 0222 07/18/24 0450  NA 139 141  --  145 143 143  K 3.7 3.2*  --  3.5 3.9 3.1*  CL 102 104  --  107 105 108  CO2 28 28  --  26 27 28   GLUCOSE 117* 129*  --  173* 129* 140*  BUN 25* 29*  --  33* 34* 24*  CREATININE 0.92 0.99  --  1.27* 1.38* 1.04  CALCIUM  8.2* 8.1*  --  7.5* 7.9* 7.8*  MG 2.4 2.6* 2.4  --  2.6* 2.4  PHOS 2.7 2.7  --   --  3.1 2.4*   GFR: Estimated Creatinine Clearance: 87.1 mL/min (by C-G formula based on SCr of 1.04 mg/dL). Recent Labs  Lab 07/11/24 2137 07/12/24 0215 07/12/24 0900 07/12/24 1439 07/15/24 0307 07/16/24 0334 07/16/24 1607 07/16/24 1928 07/17/24 0222 07/18/24 0450  WBC 6.2   < > 8.2   < > 12.4* 12.6*  --   --  31.2* 17.2*  LATICACIDVEN 5.9*  --  3.4*  --   --   --  0.8 1.2  --   --    < > = values in this interval not displayed.  Liver Function Tests: Recent Labs  Lab 07/11/24 1706 07/12/24 0900 07/14/24 0535 07/17/24 0222 07/18/24 0450  AST 21 62* 41  --   --   ALT 16 18 18   --   --   ALKPHOS 43 45 31*  --   --   BILITOT 0.9 0.9 1.3*  --   --   PROT 7.3 7.3 6.6  --   --   ALBUMIN 3.5 3.5 3.2* 2.9* 2.6*   No results for input(s): LIPASE, AMYLASE in the last 168 hours. Recent Labs  Lab 07/14/24 0535   AMMONIA 21    ABG    Component Value Date/Time   PHART 7.47 (H) 07/18/2024 0347   PCO2ART 43 07/18/2024 0347   PO2ART 86 07/18/2024 0347   HCO3 31.3 (H) 07/18/2024 0347   ACIDBASEDEF 0.8 07/16/2024 0915   O2SAT 98.2 07/18/2024 0347     Coagulation Profile: Recent Labs  Lab 07/11/24 1706  INR 1.1    Cardiac Enzymes: No results for input(s): CKTOTAL, CKMB, CKMBINDEX, TROPONINI in the last 168 hours.  HbA1C: Hemoglobin A1C  Date/Time Value Ref Range Status  12/02/2013 03:52 PM 5.3 4.2 - 6.3 % Final    Comment:    The American Diabetes Association recommends that a primary goal of therapy should be <7% and that physicians should reevaluate the treatment regimen in patients with HbA1c values consistently >8%.    Hgb A1c MFr Bld  Date/Time Value Ref Range Status  10/28/2022 04:37 AM 5.3 4.8 - 5.6 % Final    Comment:    (NOTE) Pre diabetes:          5.7%-6.4%  Diabetes:              >6.4%  Glycemic control for   <7.0% adults with diabetes     CBG: Recent Labs  Lab 07/17/24 1651 07/17/24 1706 07/17/24 1931 07/17/24 2342 07/18/24 0322  GLUCAP 86 130* 111* 130* 130*    Review of Systems:   Unable to assess pt sedated/AMS/Critical illness  Past Medical History:  He,  has a past medical history of COPD (chronic obstructive pulmonary disease) (HCC), CVA (cerebral infarction), Headache, Hypertension, MI (myocardial infarction) (HCC) (05/09/2012), Reading difficulty, Stroke (HCC) (2012), and Wears dentures.   Surgical History:   Past Surgical History:  Procedure Laterality Date   CAROTID ENDARTERECTOMY Left    2012 or 2013   CHOLECYSTECTOMY N/A 07/26/2016   Procedure: LAPAROSCOPIC CHOLECYSTECTOMY;  Surgeon: Charlie FORBES Fell, MD;  Location: [facility] ORS;  Service: General;  Laterality: N/A;   COLONOSCOPY N/A 05/08/2024   Procedure: COLONOSCOPY;  Surgeon: Jinny Carmine, MD;  Location: [facility] ENDOSCOPY;  Service: Endoscopy;  Laterality: N/A;   COLONOSCOPY WITH  PROPOFOL  N/A 02/09/2022   Procedure: COLONOSCOPY WITH PROPOFOL ;  Surgeon: Jinny Carmine, MD;  Location[facility] ENDOSCOPY;  Service: Endoscopy;  Laterality: N/A;   CORONARY ARTERY BYPASS GRAFT  05/10/2012   3 vessel   CORONARY STENT INTERVENTION N/A 07/02/2024   Procedure: CORONARY STENT INTERVENTION;  Surgeon: Katina Albright, MD;  Location: [facility] INVASIVE CV LAB;  Service: Cardiovascular;  Laterality: N/A;   CORONARY ULTRASOUND/IVUS N/A 07/02/2024   Procedure: Coronary Ultrasound/IVUS;  Surgeon: Katina Albright, MD;  Location: [facility] INVASIVE CV LAB;  Service: Cardiovascular;  Laterality: N/A;   ENTEROSCOPY N/A 05/16/2023   Procedure: ENTEROSCOPY;  Surgeon: Therisa Bi, MD;  [facility];  Service: Gastroenterology;  Laterality: N/A;   ENTEROSCOPY N/A 06/01/2023   Procedure: ENTEROSCOPY;  Surgeon: Unk Corinn Skiff, MD;  Location: [facility]  ENDOSCOPY;  Service: Gastroenterology;  Laterality: N/A;   ESOPHAGOGASTRODUODENOSCOPY  05/13/2023   Procedure: ESOPHAGOGASTRODUODENOSCOPY (EGD);  Surgeon: Jinny Carmine, MD;  Location: [facility] ENDOSCOPY;  Service: Endoscopy;;   ESOPHAGOGASTRODUODENOSCOPY (EGD) WITH PROPOFOL  N/A 10/28/2022   Procedure: ESOPHAGOGASTRODUODENOSCOPY (EGD) WITH PROPOFOL ;  Surgeon: Onita Elspeth Sharper, DO;  Location: [facility] ENDOSCOPY;  Service: Gastroenterology;  Laterality: N/A;   GIVENS CAPSULE STUDY  05/13/2023   Procedure: GIVENS CAPSULE STUDY;  Surgeon: Jinny Carmine, MD;  Location: [facility] ENDOSCOPY;  Service: Endoscopy;;   GIVENS CAPSULE STUDY  06/01/2023   Procedure: GIVENS CAPSULE STUDY;  Surgeon: Unk Corinn Skiff, MD;  Location: [facility] ENDOSCOPY;  Service: Gastroenterology;;   LEFT HEART CATH AND CORS/GRAFTS ANGIOGRAPHY N/A 07/02/2024   Procedure: LEFT HEART CATH AND CORS/GRAFTS ANGIOGRAPHY;  Surgeon: Katina Albright, MD;  Location: [facility] INVASIVE CV LAB;  Service: Cardiovascular;  Laterality: N/A;     Social History:   reports that he quit smoking about 32 years ago. His smoking use included  cigarettes. His smokeless tobacco use includes snuff. He reports current alcohol  use of about 26.0 standard drinks of alcohol  per week. He reports that he does not use drugs.   Family History:  His family history includes Pneumonia in his mother.   Allergies Allergies  Allergen Reactions   Wellbutrin  [Bupropion ]     Paranoia      Home Medications  Prior to Admission medications   Medication Sig Start Date End Date Taking? Authorizing Provider  albuterol  (VENTOLIN  HFA) 108 (90 Base) MCG/ACT inhaler Inhale 2 puffs into the lungs every 6 (six) hours as needed for wheezing or shortness of breath. 07/03/24  Yes Von Bellis, MD  ascorbic acid  (VITAMIN C ) 500 MG tablet Take 1 tablet (500 mg total) by mouth daily. 07/03/24 10/01/24 Yes Von Bellis, MD  aspirin  EC 81 MG tablet Take 1 tablet (81 mg total) by mouth daily. Swallow whole. 07/03/24 12/30/24 Yes Von Bellis, MD  clopidogrel  (PLAVIX ) 75 MG tablet Take 1 tablet (75 mg total) by mouth daily with breakfast. 07/03/24  Yes Von Bellis, MD  dapagliflozin  propanediol (FARXIGA ) 10 MG TABS tablet Take 1 tablet (10 mg total) by mouth daily. 07/03/24  Yes Von Bellis, MD  folic acid  (FOLVITE ) 1 MG tablet Take 1 tablet (1 mg total) by mouth daily. 07/03/24 10/01/24 Yes Von Bellis, MD  iron  polysaccharides (NIFEREX) 150 MG capsule Take 1 capsule (150 mg total) by mouth daily. 07/03/24 10/01/24 Yes Von Bellis, MD  isosorbide  mononitrate (IMDUR ) 60 MG 24 hr tablet Take 1 tablet (60 mg total) by mouth daily. 07/03/24 07/03/25 Yes Von Bellis, MD  losartan  (COZAAR ) 25 MG tablet Take 0.5 tablets (12.5 mg total) by mouth daily. 07/04/24 07/04/25 Yes Von Bellis, MD  metoprolol  succinate (TOPROL -XL) 25 MG 24 hr tablet Take 0.5 tablets (12.5 mg total) by mouth daily. 07/03/24  Yes Von Bellis, MD  pantoprazole  (PROTONIX ) 40 MG tablet Take 1 tablet (40 mg total) by mouth daily. 07/03/24 10/01/24 Yes Von Bellis, MD  pravastatin  (PRAVACHOL ) 20 MG  tablet Take 1 tablet (20 mg total) by mouth at bedtime. 07/03/24 07/03/25 Yes Von Bellis, MD  spironolactone  (ALDACTONE ) 25 MG tablet Take 0.5 tablets (12.5 mg total) by mouth daily. 07/03/24  Yes Von Bellis, MD  torsemide  (DEMADEX ) 10 MG tablet Take 1 tablet (10 mg total) by mouth daily. 07/03/24  Yes Von Bellis, MD     Critical care time: 40 minutes       Inge Lecher, AGACNP-BC [facility] Prefer epic messenger  for cross cover needs If after hours, please call E-link

## 2024-07-18 NOTE — Progress Notes (Signed)
 Extubation Procedure Note  Patient Details:   Name: [name] DOB: [birth year] MRN: [id number]   Airway Documentation:    Vent end date: (P) 07/18/24 Vent end time: (P) 1144   Evaluation  O2 sats: stable throughout97% on HHFNC 40L/40% Complications: No apparent complications Patient did tolerate procedure well. Bilateral Breath Sounds: Diminished   Yes  [name] [name] 07/18/2024, 11:49 AM

## 2024-07-18 NOTE — Progress Notes (Signed)
 Pt has been on WUA/SBT (PSV 5/5) for a couple of hours.  Pt is now awake and alert, nodding to questions and following commands.  RR 18, with TV 500-600's, no significant secretions noted from ETT.  Discussed with Dr. Malka, will plan to extubate to [facility] to give him some PEEP due to his severe aortic stenosis.    Inge Lecher, AGACNP-BC [facility] Prefer epic messenger for cross cover needs If after hours, please call E-link

## 2024-07-18 NOTE — Progress Notes (Signed)
 [facility] CARDIOLOGY PROGRESS NOTE       Patient ID: [name] MRN: [id number] DOB/AGE: 1961/09/07 63 y.o.  Admit date: 07/11/2024 Referring Physician Dr. Charlie Sellar Primary Physician Osa Geralds, NP  Primary Cardiologist Tinnie Maiden, NP  Reason for Consultation elevated troponins  HPI: [name] is a 63 y.o. 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 Found to have pneumonia, overnight troponins uptrending. Cardiology was consulted for further evaluation.   Interval history: -Patient seen and examined this AM, resting in bed. Patient remains intubated and sedated. -Patient BP improving, HR stable -Per tele much improvement to ventricular ectopy s/p IV amio. Per tele in SR, rate 70s -Hgb has remains stable.   Review of systems complete and found to be negative unless listed above    Past Medical History:  Diagnosis Date   COPD (chronic obstructive pulmonary disease) (HCC)    CVA (cerebral infarction)    Headache    mild, since stroke 2012   Hypertension    MI (myocardial infarction) (HCC) 05/09/2012   Reading difficulty    pt reports he reads at about a second grade level   Stroke [facility]) 2012   numbness to left hand    Wears dentures    full upper and lower    Past Surgical History:  Procedure Laterality Date   CAROTID ENDARTERECTOMY Left    2012 or 2013   CHOLECYSTECTOMY N/A 07/26/2016   Procedure: LAPAROSCOPIC CHOLECYSTECTOMY;  Surgeon: Charlie FORBES Fell, MD;  Location: [facility] ORS;  Service: General;  Laterality: N/A;   COLONOSCOPY N/A 05/08/2024   Procedure: COLONOSCOPY;  Surgeon: Jinny Carmine, MD;  Location: [facility]  ENDOSCOPY;  Service: Endoscopy;  Laterality: N/A;   COLONOSCOPY WITH PROPOFOL  N/A 02/09/2022   Procedure: COLONOSCOPY WITH PROPOFOL ;  Surgeon: Jinny Carmine, MD;  Location: [facility] ENDOSCOPY;  Service: Endoscopy;  Laterality: N/A;   CORONARY ARTERY BYPASS GRAFT  05/10/2012   3 vessel   CORONARY STENT INTERVENTION N/A 07/02/2024   Procedure: CORONARY STENT INTERVENTION;  Surgeon: Katina Albright, MD;  Location: [facility] INVASIVE CV LAB;  Service: Cardiovascular;  Laterality: N/A;   CORONARY ULTRASOUND/IVUS N/A 07/02/2024   Procedure: Coronary Ultrasound/IVUS;  Surgeon: Katina Albright, MD;  Location: [facility] INVASIVE CV LAB;  Service: Cardiovascular;  Laterality: N/A;   ENTEROSCOPY N/A 05/16/2023   Procedure: ENTEROSCOPY;  Surgeon: Therisa Bi, MD;  Location: [facility] ENDOSCOPY;  Service: Gastroenterology;  Laterality: N/A;   ENTEROSCOPY N/A 06/01/2023   Procedure: ENTEROSCOPY;  Surgeon: Unk Corinn Skiff, MD;  Location: [facility] ENDOSCOPY;  Service: Gastroenterology;  Laterality: N/A;   ESOPHAGOGASTRODUODENOSCOPY  05/13/2023   Procedure: ESOPHAGOGASTRODUODENOSCOPY (EGD);  Surgeon: Jinny Carmine, MD;  Location: [facility] ENDOSCOPY;  Service: Endoscopy;;   ESOPHAGOGASTRODUODENOSCOPY (EGD) WITH PROPOFOL  N/A 10/28/2022   Procedure: ESOPHAGOGASTRODUODENOSCOPY (EGD) WITH PROPOFOL ;  Surgeon: Onita Elspeth Sharper, DO;  Location: [facility] ENDOSCOPY;  Service: Gastroenterology;  Laterality: N/A;   GIVENS CAPSULE STUDY  05/13/2023   Procedure: GIVENS CAPSULE STUDY;  Surgeon: Jinny Carmine, MD;  Location: [facility] ENDOSCOPY;  Service: Endoscopy;;   GIVENS CAPSULE STUDY  06/01/2023   Procedure: GIVENS CAPSULE STUDY;  Surgeon: Unk Corinn Skiff, MD;  Location: [facility] ENDOSCOPY;  Service: Gastroenterology;;  LEFT HEART CATH AND CORS/GRAFTS ANGIOGRAPHY N/A 07/02/2024   Procedure: LEFT HEART CATH AND CORS/GRAFTS ANGIOGRAPHY;  Surgeon: Katina Albright, MD;  Location: [facility] INVASIVE CV LAB;  Service: Cardiovascular;  Laterality: N/A;    Medications Prior to  Admission  Medication Sig Dispense Refill Last Dose/Taking   albuterol  (VENTOLIN  HFA) 108 (90 Base) MCG/ACT inhaler Inhale 2 puffs into the lungs every 6 (six) hours as needed for wheezing or shortness of breath. 6.7 g 2 07/11/2024 Morning   ascorbic acid  (VITAMIN C ) 500 MG tablet Take 1 tablet (500 mg total) by mouth daily. 30 tablet 2 07/11/2024 Morning   aspirin  EC 81 MG tablet Take 1 tablet (81 mg total) by mouth daily. Swallow whole. 30 tablet 5 07/11/2024 Morning   clopidogrel  (PLAVIX ) 75 MG tablet Take 1 tablet (75 mg total) by mouth daily with breakfast. 30 tablet 11 07/11/2024 Morning   dapagliflozin  propanediol (FARXIGA ) 10 MG TABS tablet Take 1 tablet (10 mg total) by mouth daily. 30 tablet 11 07/11/2024 Morning   folic acid  (FOLVITE ) 1 MG tablet Take 1 tablet (1 mg total) by mouth daily. 30 tablet 2 07/11/2024   iron  polysaccharides (NIFEREX) 150 MG capsule Take 1 capsule (150 mg total) by mouth daily. 30 capsule 2 07/11/2024   isosorbide  mononitrate (IMDUR ) 60 MG 24 hr tablet Take 1 tablet (60 mg total) by mouth daily. 30 tablet 11 07/11/2024   losartan  (COZAAR ) 25 MG tablet Take 0.5 tablets (12.5 mg total) by mouth daily. 30 tablet 11 07/11/2024 Morning   metoprolol  succinate (TOPROL -XL) 25 MG 24 hr tablet Take 0.5 tablets (12.5 mg total) by mouth daily. 30 tablet 11 07/11/2024 Morning   pantoprazole  (PROTONIX ) 40 MG tablet Take 1 tablet (40 mg total) by mouth daily. 30 tablet 2 07/11/2024 Morning   pravastatin  (PRAVACHOL ) 20 MG tablet Take 1 tablet (20 mg total) by mouth at bedtime. 30 tablet 11 07/10/2024 Evening   spironolactone  (ALDACTONE ) 25 MG tablet Take 0.5 tablets (12.5 mg total) by mouth daily. 30 tablet 11 07/11/2024 Morning   torsemide  (DEMADEX ) 10 MG tablet Take 1 tablet (10 mg total) by mouth daily. 30 tablet 11 07/11/2024   Social History   Socioeconomic History   Marital status: Divorced    Spouse name: Not on file   Number of children: Not on file   Years of education: Not on  file   Highest education level: Not on file  Occupational History   Not on file  Tobacco Use   Smoking status: Former    Current packs/day: 0.00    Types: Cigarettes    Quit date: 28    Years since quitting: 32.6   Smokeless tobacco: Current    Types: Snuff   Tobacco comments:    changed to dip  Vaping Use   Vaping status: Never Used  Substance and Sexual Activity   Alcohol  use: Yes    Alcohol /week: 26.0 standard drinks of alcohol     Types: 26 Cans of beer per week   Drug use: No   Sexual activity: Yes    Birth control/protection: None  Other Topics Concern   Not on file  Social History Narrative   Not on file   Social Drivers of Health   Financial Resource Strain: Not on file  Food Insecurity: No Food Insecurity (07/12/2024)   Hunger Vital Sign    Worried About Running Out of Food in the Last Year: Never true    Ran Out of Food in the Last Year: Never true  Transportation Needs: No Transportation Needs (07/12/2024)   PRAPARE - Administrator, Civil Service (Medical): No    Lack of Transportation (Non-Medical): No  Physical Activity: Not on file  Stress: Not on file  Social Connections: Socially Isolated (07/12/2024)   Social Connection and Isolation Panel    Frequency of Communication with Friends and Family: Once a week    Frequency of Social Gatherings with Friends and Family: Once a week    Attends Religious Services: Never    Database administrator or Organizations: No    Attends Banker Meetings: Never    Marital Status: Divorced  Catering manager Violence: Not At Risk (07/12/2024)   Humiliation, Afraid, Rape, and Kick questionnaire    Fear of Current or Ex-Partner: No    Emotionally Abused: No    Physically Abused: No    Sexually Abused: No    Family History  Problem Relation Age of Onset   Pneumonia Mother      Vitals:   07/18/24 0800 07/18/24 0900 07/18/24 0932 07/18/24 1000  BP: 118/66 116/63 110/69 121/69  Pulse: 71 72  75 75  Resp: 20 20 18 19   Temp: 98.7 F (37.1 C)     TempSrc: Axillary     SpO2: 95% 98% 98% 98%  Weight:      Height:        PHYSICAL EXAM General: Ill appearing male, intubated,  sedated HEENT: Normocephalic and atraumatic. Neck: No JVD.  Lungs: Mechanically breath sounds bilaterally.  Heart: HRRR, elevated rate. Normal S1 and S2 without gallops. +systolic ejection murmur.   Abdomen: Non-distended appearing.  Msk: Normal strength and tone for age. Extremities: Warm and well perfused. No clubbing, cyanosis, edema.   Labs: Basic Metabolic Panel: Recent Labs    07/17/24 0222 07/18/24 0450  NA 143 143  K 3.9 3.1*  CL 105 108  CO2 27 28  GLUCOSE 129* 140*  BUN 34* 24*  CREATININE 1.38* 1.04  CALCIUM  7.9* 7.8*  MG 2.6* 2.4  PHOS 3.1 2.4*   Liver Function Tests: Recent Labs    07/17/24 0222 07/18/24 0450  ALBUMIN 2.9* 2.6*   No results for input(s): LIPASE, AMYLASE in the last 72 hours. CBC: Recent Labs    07/17/24 0222 07/17/24 0802 07/18/24 0450 07/18/24 0621  WBC 31.2*  --  17.2*  --   HGB 8.9*   < > 8.3* 8.6*  HCT 29.2*   < > 27.6* 28.4*  MCV 88.5  --  90.2  --   PLT 262  --  213  --    < > = values in this interval not displayed.   Cardiac Enzymes: Recent Labs    07/16/24 1611 07/16/24 1928 07/16/24 2317  TROPONINIHS 3,191* 3,088* 2,707*    BNP: No results for input(s): BNP in the last 72 hours.  D-Dimer: No results for input(s): DDIMER in the last 72 hours. Hemoglobin A1C: No results for input(s): HGBA1C in the last 72 hours. Fasting Lipid Panel: Recent Labs    07/17/24 0222  TRIG 68   Thyroid Function Tests: No results for input(s): TSH, T4TOTAL, T3FREE, THYROIDAB in the last 72 hours.  Invalid input(s): FREET3 Anemia Panel: No results for input(s): VITAMINB12, FOLATE, FERRITIN, TIBC, IRON , RETICCTPCT in the last 72 hours.   Radiology: [facility] Chest Port 1 View Result Date: 07/18/2024 EXAM: 1 VIEW  XRAY OF THE CHEST 07/18/2024 04:59:06 AM COMPARISON: 07/16/2024 CLINICAL HISTORY: [id number] Acute respiratory failure with hypoxia (HCC) 427266.  Acute respiratory failure with hypoxia FINDINGS: LUNGS AND PLEURA: Nodular opacity within the periphery of the right upper lobe appears similar to CT from 07/11/2024. Please refer to that report for follow-up recommendations. Mild asymmetric hazy opacification over the right mid and right lower lung may be related to positional artifact versus small posterior layering effusion. Mild subsegmental atelectasis in the left base. HEART AND MEDIASTINUM: Stable cardiomediastinal contours. Previous median sternotomy and CABG procedure. BONES AND SOFT TISSUES: Aortic atherosclerotic calcification. Left IJ catheter is identified with the tip in the projection of the SVC. There is an enteric tube which courses below the field of view. ET tube tip is stable above the carina. IMPRESSION: 1. Stable nodular opacity within the periphery of the right upper lobe, similar to CT from 07/11/2024. Please refer to that report for follow-up recommendations. 2. Mild asymmetric hazy opacification over the right mid and right lower lung, possibly related to rotational artifact versus small posterior layering effusion. 3. Mild subsegmental atelectasis in the left base. Electronically signed by: Waddell Calk MD 07/18/2024 06:51 AM EDT RP Workstation: [id number]   DG Chest Port 1 View Result Date: 07/16/2024 CLINICAL DATA:  Endotracheal tube and central line placement. EXAM: PORTABLE CHEST 1 VIEW COMPARISON:  07/16/2024 at 8:58 a.m. FINDINGS: Patient is rotated to the right. Endotracheal tube has tip proximally 13.5 cm above the carina. This could be advanced approximately 6 cm. Left IJ central venous catheter has tip over the SVC. Enteric tube courses into the region of the stomach and off the image as tip is not visualized. Lungs are adequately inflated with possible mild hazy opacification over the  right upper lung abutting the minor fissure which may be due to atelectasis or infection. Left lung is clear. Cardiomediastinal silhouette and remainder of the exam is unchanged. IMPRESSION: 1. Endotracheal tube has tip proximally 13.5 cm above the carina. This could be advanced approximately 6 cm. 2. Left IJ central venous catheter with tip over the SVC. 3. Possible mild hazy opacification over the right upper lung abutting the minor fissure which may be due to atelectasis or infection. Electronically Signed   By: Toribio Agreste M.D.   On: 07/16/2024 10:53   DG Chest Port 1 View Result Date: 07/16/2024 CLINICAL DATA:  [id number] Aspiration into airway [id number]. EXAM: PORTABLE CHEST 1 VIEW COMPARISON:  06/23/2024. FINDINGS: Bilateral lungs appear hyperlucent with coarse bronchovascular markings, in keeping with COPD. Bilateral lungs otherwise appear clear. No dense consolidation or lung collapse. Bilateral costophrenic angles are clear. Stable cardio-mediastinal silhouette. There are surgical staples along the heart border and sternotomy wires, status post CABG (coronary artery bypass graft). No acute osseous abnormalities. The soft tissues are within normal limits. Enteric tube is seen coursing below the left hemidiaphragm however, the tip is not included on the film. IMPRESSION: No active disease. COPD. Electronically Signed   By: Ree Molt M.D.   On: 07/16/2024 09:09   DG Abd 1 View Result Date: 07/16/2024 CLINICAL DATA:  Vomiting.  NG tube placement. EXAM: ABDOMEN - 1 VIEW COMPARISON:  07/15/2024 FINDINGS: Nasogastric tube has tip and side-port over the stomach in the upper abdomen just left of midline. Visualized lower thorax unchanged. Abdominopelvic images demonstrate a non obstructive bowel gas pattern. Mild fecal retention throughout the colon. Surgical clips over the right upper quadrant. There are degenerative changes of the spine and hips. Calcified plaque over the abdominal aorta and iliac  arteries. IMPRESSION: 1. Nasogastric tube with tip and side-port over the stomach. 2. Nonobstructive bowel  gas pattern. Mild fecal retention. 3. Aortic atherosclerosis. Electronically Signed   By: Toribio Agreste M.D.   On: 07/16/2024 07:51   DG Abd 1 View Result Date: 07/15/2024 CLINICAL DATA:  Orogastric tube placement. EXAM: ABDOMEN - 1 VIEW COMPARISON:  June 29, 2023. FINDINGS: Distal tip of nasogastric tube is seen in expected position of proximal stomach. IMPRESSION: Nasogastric tube tip seen in expected position of proximal stomach. Electronically Signed   By: Lynwood Landy Raddle M.D.   On: 07/15/2024 10:25   DG Chest Port 1 View Result Date: 07/13/2024 CLINICAL DATA:  Respiratory failure with hypoxia. EXAM: PORTABLE CHEST 1 VIEW COMPARISON:  Multiple previous chest x-rays and recent chest CT. FINDINGS: Stable surgical changes from bypass surgery. The cardiac silhouette, mediastinal and hilar contours are within normal limits and stable. Underlying emphysematous changes and pulmonary scarring. No pleural effusions or discrete pulmonary infiltrates. No pneumothorax. IMPRESSION: Emphysematous changes and pulmonary scarring but no acute overlying pulmonary process. Electronically Signed   By: MYRTIS Stammer M.D.   On: 07/13/2024 16:08   US  Abdomen Limited RUQ (LIVER/GB) Result Date: 07/13/2024 CLINICAL DATA:  Portal hypertension. EXAM: ULTRASOUND ABDOMEN LIMITED RIGHT UPPER QUADRANT COMPARISON:  Abdominal ultrasound 10/19/2022 FINDINGS: Gallbladder: Surgically absent. Common bile duct: Diameter: 3.3 mm Liver: No focal lesion identified. Parenchymal echogenicity is increased and diffusely heterogeneous. There is lobular liver contour. Portal vein is patent on color Doppler imaging with normal direction of blood flow towards the liver. Other: None. IMPRESSION: Lobular liver contour with increased echogenicity and heterogeneous parenchyma consistent with cirrhosis. No focal liver lesion identified. Electronically  Signed   By: Greig Pique M.D.   On: 07/13/2024 16:03   DG Chest Port 1 View Result Date: 07/12/2024 CLINICAL DATA:  Shortness of breath. EXAM: PORTABLE CHEST 1 VIEW COMPARISON:  07/11/2024 FINDINGS: The cardio pericardial silhouette is enlarged. There is pulmonary vascular congestion without overt pulmonary edema. Trace bilateral pleural effusions. Old posterior left sixth rib fracture. Telemetry leads overlie the chest. IMPRESSION: Enlargement of the cardiopericardial silhouette with new pulmonary vascular congestion and trace bilateral pleural effusions. Electronically Signed   By: Camellia Candle M.D.   On: 07/12/2024 12:11   CT Angio Chest PE W/Cm &/Or Wo Cm Result Date: 07/11/2024 CLINICAL DATA:  High probability for PE. Chest pain with shortness of breath EXAM: CT ANGIOGRAPHY CHEST WITH CONTRAST TECHNIQUE: Multidetector CT imaging of the chest was performed using the standard protocol during bolus administration of intravenous contrast. Multiplanar CT image reconstructions and MIPs were obtained to evaluate the vascular anatomy. RADIATION DOSE REDUCTION: This exam was performed according to the departmental dose-optimization program which includes automated exposure control, adjustment of the mA and/or kV according to patient size and/or use of iterative reconstruction technique. CONTRAST:  75mL OMNIPAQUE  IOHEXOL  350 MG/ML SOLN COMPARISON:  CT angiogram chest 05/09/2024 FINDINGS: Cardiovascular: Satisfactory opacification of the pulmonary arteries to the segmental level. No evidence of pulmonary embolism. Patient is status post cardiac surgery. The heart is enlarged. There is no pericardial effusion. There are atherosclerotic calcifications of the aorta and coronary arteries. Aorta is normal in size. Mediastinum/Nodes: There are enlarged right hilar lymph nodes measuring up to 11 mm. Enlarged paratracheal lymph nodes measure up to 10 mm. Visualized thyroid gland and esophagus are within normal limits.  Lungs/Pleura: Moderate emphysema again seen. There is stable scarring in the right lung apex. There is new patchy slightly nodular airspace disease in the right upper lobe measuring 2.0 by 1.1 cm. There is right-sided central peribronchial wall  thickening. Previously identified airspace disease in the left lower lobe has resolved. There is no pleural effusion or pneumothorax. There is a stable left lower lobe pulmonary nodule image 6/57. Upper Abdomen: No acute abnormality. Musculoskeletal: No chest wall abnormality. No acute or significant osseous findings. Review of the MIP images confirms the above findings. IMPRESSION: 1. No evidence for pulmonary embolism. 2. New patchy slightly nodular airspace disease in the right upper lobe worrisome for pneumonia. Follow-up CT recommended in 4-6 weeks recommended to ensure resolution and to exclude underlying nodule. 3. Right-sided central peribronchial wall thickening worrisome for bronchitis. 4. Right hilar and mediastinal lymphadenopathy, likely reactive. 5. Stable left lower lobe pulmonary nodule. 6. Cardiomegaly. Aortic Atherosclerosis (ICD10-I70.0) and Emphysema (ICD10-J43.9). Electronically Signed   By: Greig Pique M.D.   On: 07/11/2024 18:54   DG Chest Portable 1 View Result Date: 07/11/2024 CLINICAL DATA:  SOB EXAM: PORTABLE CHEST - 1 VIEW COMPARISON:  June 24, 2024 FINDINGS: Biapical pleural thickening. Sternotomy wires and CABG markers. No focal airspace consolidation, pleural effusion, or pneumothorax. Mild cardiomegaly. Tortuous aorta with aortic atherosclerosis. No acute fracture or destructive lesions. Multilevel thoracic osteophytosis. IMPRESSION: No acute cardiopulmonary abnormality. Electronically Signed   By: Rogelia Myers M.D.   On: 07/11/2024 17:25   CARDIAC CATHETERIZATION Result Date: 07/02/2024   Prox LAD to Mid LAD lesion is 90% stenosed.   Ost LM to Mid LM lesion is 50% stenosed.   Ost Cx to Prox Cx lesion is 90% stenosed.   Ost RCA to  Prox RCA lesion is 100% stenosed.   Mid Graft to Dist Graft lesion is 95% stenosed.   Recommend uninterrupted dual antiplatelet therapy with Aspirin  81mg  daily and Clopidogrel  75mg  daily for a minimum of 6 months (stable ischemic heart disease-Class I recommendation). 1.  Severe native three-vessel CAD 2.  LIMA to LAD and SVG to OM widely patent 3.  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  Successful direct stenting of the distal SVG to PDA with distal protection with a 4.0 x 15 mm drug-eluting stent with intravascular ultrasound guidance 5.  Aspirin  and clopidogrel  for at least 6 months followed by clopidogrel  indefinitely 6.  Continue workup for aortic valve replacement   DG Chest 2 View Result Date: 06/24/2024 CLINICAL DATA:  sob EXAM: CHEST - 2 VIEW COMPARISON:  June 03, 2024 FINDINGS: Biapical pleural thickening. No focal airspace consolidation, pleural effusion, or pneumothorax. No cardiomegaly. Sternotomy wires and CABG markers. Tortuous aorta with aortic atherosclerosis. No acute fracture or destructive lesions. Multilevel thoracic osteophytosis. Osteopenia. IMPRESSION: No acute cardiopulmonary abnormality. Electronically Signed   By: Rogelia Myers M.D.   On: 06/24/2024 11:54    ECHO 04/2024: 1. Left ventricular ejection fraction, by estimation, is 60 to 65%. The left ventricle has normal function. The left ventricle has no regional wall motion abnormalities. The left ventricular internal cavity size was mildly dilated. Left ventricular diastolic parameters are consistent with Grade I diastolic dysfunction (impaired relaxation).   2. Right ventricular systolic function is normal. The right ventricular  size is normal.   3. The mitral valve is normal in structure. No evidence of mitral valve  regurgitation.   4. The aortic valve is normal in structure. Aortic valve regurgitation is  trivial. Severe aortic valve stenosis.   TELEMETRY reviewed by me 07/18/2024: sinus rhythm, rate  70s  EKG reviewed by me: sinus tachycardia ST depression, rate 129 bpm  Data reviewed by me 07/18/2024: last 24h vitals tele labs imaging  I/O hospitalist progress note, PCCM notes  Principal Problem:   Acute respiratory distress Active Problems:   Hyperlipidemia   Cognitive impairment   Alcohol  abuse   Essential hypertension   CAD (coronary artery disease)   Stroke (HCC)   Iron  deficiency anemia   Acute on chronic diastolic CHF (congestive heart failure) (HCC)   COPD exacerbation (HCC)   Elevated lactic acid level   Lobar pneumonia (HCC)   Metabolic acidosis, increased anion gap   Myocardial injury    ASSESSMENT AND PLAN:  [name] is a 63 y.o. 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 Found to have pneumonia, overnight troponins uptrending. Cardiology was consulted for further evaluation.   # Ventricular Ectopy Per tele patient with bigeminy PVCs, started on IV amio on 07/30 for ventricular ectopy. PVC much improved s/p IV amio. Per tele this AM SR with rate 70s. -Continue to monitor telemetry closely.  -Monitor and replenish electrolytes for a goal K >4, Mag >2  -Transition IV amio to per tube. Amio 400 mg BID for 10 days, then 200 mg daily.    # Acute hypoxic respiratory failure, mechanical ventilation # Aspiration pneumonia # Alcohol  withdrawal # Acute on chronic HFpEF (flash pulmonary edema) # Severe aortic stenosis Presented with SOB, cough, imaging concerning for HCAP/COPD exacerbation. LA elevated on presentation at 3.6 > 5.9> 3.4. Patient intubated. Patient appears near euvolemia. -Hold home dapagliflozin  10 mg daily until BP  stabilizes. -Will plan to optimize GDMT when patient hemodynamically stable. -Further management of respiratory failure/ aspiration PNA as per primary team.  -CIWA protocol given hx of alcohol  abuse. -Scheduled to see Dr. Katina 7/30 to discuss aortic valve repair/replacement options. Will reschedule this appointment when patient stabilizes.    # NSTEMI # Coronary artery disease s/p CABG x3 (04/2012) # Hypertension # Hyperlipidemia With recent LHC, s/p DES to SVG to PDA 07/02/2024. Reported some CP when he first came to hospital which has now resolved, no recurrence. Troponin trended 44 > 144 > 205 > 980 > 1862 > [id number] > [id number]. Patient weaned off levo on 07/31, BP improving. -Continue home plavix  75 mg daily. Continue aspirin  81 mg daily.  (Noted to have black stool on 07/30, Hgb has remained stable). Continue DAPT due to recent stent placement on 07/16. -Started on heparin  gtt by [facility] 7/26, Low Hgb. Completed 24 hours of heparin .  -Continue Crestor  20 mg daily for high-intensity statin management.  -Hold home metoprolol  succinate until BP improves and stabilizes.  -Home losartan , Imdur  held due to BP borderline. Will consider resuming as BP continues to improve.  -Recommend MAP > 65.   # Acute on chronic anemia # Hx AVM Patient received blood transfusion on 07/28 with improving Hgb. Hgb has remained stable. -Closely monitor H&H. -Continue PPIs, GI consulted and following. -Goal Hgb >8 due to cardiac history. Recommend transfusion. 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  Did the patient have an acute coronary syndrome (MI, NSTEMI, STEMI, etc) this admission?:  Yes  AHA/ACC ACS Clinical Performance & Quality Measures: Aspirin  prescribed? - Yes ADP Receptor Inhibitor (Plavix /Clopidogrel , Brilinta/Ticagrelor or Effient/Prasugrel) prescribed (includes medically managed  patients)? - Yes Beta Blocker prescribed? - Yes High Intensity Statin (Lipitor 40-80mg  or Crestor  20-40mg ) prescribed? - Yes EF assessed during THIS hospitalization? - No - patient with recent echo performed on 05/10/2024.  For EF <40%, was ACEI/ARB prescribed? - Not Applicable (EF >/= 40%) For EF <40%, Aldosterone Antagonist (Spironolactone  or Eplerenone) prescribed? - Not Applicable (EF >/= 40%) Cardiac Rehab Phase II ordered (including medically managed patients)? - Yes, Patient with recent outpatient LHC performed on 07/16, referred to cardiac rehab during that time on 07/04/24.     This patient's plan of care was discussed and created with Dr. Wilburn and he is in agreement.  Signed: Xochil Shanker, PA-C  07/18/2024, 10:16 AM [facility]

## 2024-07-19 ENCOUNTER — Inpatient Hospital Stay

## 2024-07-19 DIAGNOSIS — R0603 Acute respiratory distress: Secondary | ICD-10-CM | POA: Diagnosis not present

## 2024-07-19 LAB — RENAL FUNCTION PANEL
Albumin: 2.6 g/dL — ABNORMAL LOW (ref 3.5–5.0)
Anion gap: 5 (ref 5–15)
BUN: 23 mg/dL (ref 8–23)
CO2: 32 mmol/L (ref 22–32)
Calcium: 8 mg/dL — ABNORMAL LOW (ref 8.9–10.3)
Chloride: 108 mmol/L (ref 98–111)
Creatinine, Ser: 0.85 mg/dL (ref 0.61–1.24)
GFR, Estimated: >60 mL/min (ref >60)
Glucose, Bld: 167 mg/dL — ABNORMAL HIGH (ref 70–99)
Phosphorus: 3.5 mg/dL (ref 2.5–4.6)
Potassium: 3.7 mmol/L (ref 3.5–5.1)
Sodium: 145 mmol/L (ref 135–145)

## 2024-07-19 LAB — GLUCOSE, CAPILLARY
Glucose-Capillary: 122 mg/dL — ABNORMAL HIGH (ref 70–99)
Glucose-Capillary: 137 mg/dL — ABNORMAL HIGH (ref 70–99)
Glucose-Capillary: 141 mg/dL — ABNORMAL HIGH (ref 70–99)
Glucose-Capillary: 151 mg/dL — ABNORMAL HIGH (ref 70–99)
Glucose-Capillary: 153 mg/dL — ABNORMAL HIGH (ref 70–99)
Glucose-Capillary: 154 mg/dL — ABNORMAL HIGH (ref 70–99)

## 2024-07-19 LAB — CBC
HCT: 29.6 % — ABNORMAL LOW (ref 39.0–52.0)
Hemoglobin: 8.7 g/dL — ABNORMAL LOW (ref 13.0–17.0)
MCH: 27 pg (ref 26.0–34.0)
MCHC: 29.4 g/dL — ABNORMAL LOW (ref 30.0–36.0)
MCV: 91.9 fL (ref 80.0–100.0)
Platelets: 227 K/uL (ref 150–400)
RBC: 3.22 MIL/uL — ABNORMAL LOW (ref 4.22–5.81)
RDW: 16.4 % — ABNORMAL HIGH (ref 11.5–15.5)
WBC: 14.9 K/uL — ABNORMAL HIGH (ref 4.0–10.5)
nRBC: 0 % (ref 0.0–0.2)

## 2024-07-19 LAB — CULTURE, RESPIRATORY W GRAM STAIN: Culture: NO GROWTH

## 2024-07-19 LAB — MAGNESIUM: Magnesium: 2.4 mg/dL (ref 1.7–2.4)

## 2024-07-19 MED ORDER — MELATONIN 5 MG PO TABS
5.0000 mg | ORAL_TABLET | Freq: Every evening | ORAL | Status: DC | PRN
  Administered 2024-07-19 – 2024-07-28 (×9): 5 mg
  Filled 2024-07-19 (×8): qty 1

## 2024-07-19 MED ORDER — ACETYLCYSTEINE 20 % IN SOLN: 3.0000 mL | Freq: Two times a day (BID) | RESPIRATORY_TRACT | Status: DC

## 2024-07-19 MED ORDER — POTASSIUM CHLORIDE 20 MEQ PO PACK
40.0000 meq | PACK | Freq: Once | ORAL | Status: AC
  Administered 2024-07-19: 40 meq via NASOGASTRIC
  Filled 2024-07-19: qty 2

## 2024-07-19 MED ORDER — ACETYLCYSTEINE 10% NICU INHALATION SOLUTION: 2.0000 mL | Freq: Two times a day (BID) | RESPIRATORY_TRACT | Status: DC

## 2024-07-19 MED ORDER — POTASSIUM CHLORIDE 20 MEQ PO PACK: 40.0000 meq | PACK | Freq: Once | ORAL | Status: DC

## 2024-07-19 MED ORDER — MORPHINE SULFATE (PF) 2 MG/ML IV SOLN
2.0000 mg | INTRAVENOUS | Status: DC | PRN
  Administered 2024-07-19 – 2024-07-31 (×28): 2 mg via INTRAVENOUS
  Filled 2024-07-19 (×24): qty 1

## 2024-07-19 NOTE — Progress Notes (Signed)
 Progress Note    [name]  FMW:[id number] DOB: 1961/01/13  DOA: 07/11/2024 PCP: Osa Geralds, NP      Brief Narrative:    Medical records reviewed and are as summarized below:  [name] is a 63 y.o. male with medical history significant of  GIB, alcohol  abuse, COPD on 2 L oxygen, hypertension, CAD with prior CABG and recent Stent placement,, severe aortic stenosis, HLD, stroke, dementia, chronic diastolic CHF, who presented to the hospital on 07/11/2024 with SOB, cough productive of yellowish sputum, sided chest pain.   Patient was recently hospitalized from 7/16 - 7/17 for elective LHC. Pt was s/p of successful direct stenting of distal SVG to PDA.  CTA negative for PE, showed patchy infiltration in the right upper lobe.  Patient is admitted to telemetry bed as inpatient.    He was admitted to the hospitalist service for COPD exacerbation and acute hypoxic respiratory failure.  He developed worsening respiratory distress shortly after admission.  He was placed on BiPAP and was transferred to the ICU for further management.  He has a became agitated and this was attributed to acute alcohol  withdrawal syndrome.  He was started on IV Precedex  infusion for this. He developed acute toxic metabolic encephalopathy and worsening respiratory failure likely from aspiration pneumonia.  He required intubation and mechanical ventilation in the ICU.  He became hypotensive with induction meds despite IV fluids and IV Levophed  infusion.  There was brief loss of pulse but ROSC was achieved after 2 minutes of CPR and 1 dose of epinephrine .    Significant Hospital Events: Including procedures, antibiotic start and stop dates in addition to other pertinent events   7/25: admit with respiratory failure 7/26: clinically decompensated, worsening respiratory status initiated on BiPAP 7/27: on nasal cannula, reports anxiety and tremors consistent with EtOH withdrawal.  Received a total of 15  mg/kg of phenobarbital , however he remained agitated requiring low dose precedex  gtt.  7/28: Pt resting in bed no agitation on precedex  gtt @0 .5 mcg/min.  Orders placed to transfuse 1 unit of pRBC's hgb 7.2 goal hgb 8.0.  No signs of active bleeding  7/29: Pt remains encephalopathic with severe DT's requiring precedex  gtt.  NGT placed to initiate tube feeds and administer medications  7/30: Earlier this morning with vomiting, concern for aspiration event as now with increased WOB and increasing FiO2 requirements (up to 4L). Remains encephalopathic on Precedex .  Respiratory status continued to decline, progressed to agonal respirations, Required INTUBATION for airway protection.  Hypotensive with induction meds despite IV fluids and Levophed  resulting in brief loss of pulse (ROSC obtained after 2 minutes of CPR and 1 epi).  Central line placed due to vasopressors. 7/31: Overnight with episode on non-sustained SVT which converted spontaneously, remains on Amiodarone  gtt per Cardiology.  Nursing reported black tarry stools overnight, Hgb remains stable.   Levophed  weaned off, on minimal vent settings, will perform WUA and SBT as mental status allows.  With fever of 101 F overnight and WBC nearly doubled, tracheal aspirate results pending, will check blood cultures and UA, broaden to empiric Vancomycin  and Zosyn  pending culture results.   08/01:  No significant events reported overnight. Received 1 unit pRBC's overnight for Hgb 7.5 with appropriate response to 8.8 and has since remained stable with no reports of bleeding from nursing.  Fevers resolved, WBC improved with broadening to Zosyn  yesterday, cultures currently with no growth.  Weaned off levophed .  On minimal vent support, passed WUA and SBT,  EXTUBATED TO HHFNC.  Diuresed with 20 mg IV Lasix  x1 dose with extubation, needs aggressive pulmonary toilet.      Assessment/Plan:   Principal Problem:   Acute respiratory distress Active Problems:   COPD  exacerbation (HCC)   Lobar pneumonia (HCC)   Elevated lactic acid level   Metabolic acidosis, increased anion gap   Essential hypertension   Acute on chronic diastolic CHF (congestive heart failure) (HCC)   Hyperlipidemia   Stroke (HCC)   CAD (coronary artery disease)   Iron  deficiency anemia   Alcohol  abuse   Cognitive impairment   Myocardial injury   Nutrition Problem: Inadequate oral intake Etiology: lethargy/confusion  Signs/Symptoms: NPO status   Body mass index is 30.86 kg/m.  (Obesity)    Acute on chronic hypoxic respiratory failure: Continue oxygen via HHFNC 40% at 40 L/min.  Taper down oxygen as able. S/p extubation on 07/18/2024.  Was intubated on 07/16/2024.   Aspiration pneumonia: Continue IV Zosyn . Leukocytosis improving Hypotension: Resolved Suspected septic shock: Resolved.  He is off of IV Levophed  infusion.     COPD exacerbation: Continue bronchodilators.  Completed steroids.   Acute toxic metabolic encephalopathy: Continue supportive care Alcohol  use disorder with alcohol  withdrawal syndrome: He is off of IV Precedex  infusion.  Continue IV thiamine .   Brief cardiac arrest due to severe hypotension from induction meds on 07/16/2024 (ROSC achieved after 2 minutes of CPR and 1 dose of epinephrine ).    Acute NSTEMI, elevated troponins (peaked at 5,766), CAD, recent elective left heart cath s/p DES to SVG to PDA 07/02/2024,  history of CABG x 3 (May 2013): Continue aspirin , Plavix  and rosuvastatin .   Acute on chronic diastolic CHF, severe aortic stenosis: S/p treatment with IV Lasix . 2D echo on 05/10/2024 showed EF estimated at 60 to 65%, grade 1 diastolic dysfunction, severe aortic valve stenosis.   Chronic anemia, history of GI bleed: No evidence of overt GI bleeding thus far.  Continue IV Protonix .  H&H stable. He was evaluated by gastroenterologist on 07/13/2024.  GI signed off. 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 recommended by speech therapist for now.  Continue enteral nutrition via NG tube    AKI: Resolved    Hyperglycemia: Glucose levels are stable.  Hemoglobin A1c was 5.3 on 10/28/2022   Comorbidities include hypertension, hyperlipidemia, history of stroke    Care transferred to [facility] hospitalist service on 07/19/2024  Diet Order             Diet NPO time specified  Diet effective now                            Consultants: Intensivist Gastroenterologist Cardiologist  Procedures: Intubated on 07/16/2024 and extubated on 07/18/2024 Left IJ central line placed on 07/16/2024    Medications:    amiodarone   400 mg Per Tube BID   Followed by   NOREEN ON 07/28/2024] amiodarone   200 mg Per Tube Daily   ascorbic acid   500 mg Per Tube Daily   aspirin   81 mg Per Tube Daily   Chlorhexidine  Gluconate Cloth  6 each Topical Q0600   cholecalciferol   2,000 Units Per Tube Daily   clopidogrel   75 mg Per Tube Daily   docusate  100 mg Per Tube BID   enoxaparin  (LOVENOX ) injection  40 mg Subcutaneous QHS   folic acid   1 mg Per Tube Daily  free water   30 mL Per Tube Q4H   ipratropium-albuterol   3 mL Nebulization Q6H   iron  polysaccharides  150 mg Per Tube Daily   multivitamin with minerals  1 tablet Per Tube Daily   mouth rinse  15 mL Mouth Rinse 4 times per day   pantoprazole  (PROTONIX ) IV  40 mg Intravenous Q12H   polyethylene glycol  17 g Per Tube Daily   rosuvastatin   20 mg Per Tube Daily   sodium chloride  HYPERTONIC  4 mL Nebulization BID   [START ON 07/21/2024] thiamine  (VITAMIN B1) injection  100 mg Intravenous Daily   Continuous Infusions:  feeding supplement (PIVOT 1.5 CAL) 60 mL/hr at 07/19/24 9266   piperacillin -tazobactam (ZOSYN )  IV Stopped (07/19/24 0326)   thiamine  (VITAMIN B1) injection       Anti-infectives (From admission, onward)    Start     Dose/Rate Route Frequency Ordered Stop   07/17/24  1000  piperacillin -tazobactam (ZOSYN ) IVPB 3.375 g        3.375 g 12.5 mL/hr over 240 Minutes Intravenous Every 8 hours 07/17/24 0811     07/17/24 1000  vancomycin  (VANCOREADY) IVPB 2000 mg/400 mL  Status:  Discontinued        2,000 mg 200 mL/hr over 120 Minutes Intravenous  Once 07/17/24 0811 07/17/24 1026   07/14/24 1200  cefTRIAXone  (ROCEPHIN ) 2 g in sodium chloride  0.9 % 100 mL IVPB        2 g 200 mL/hr over 30 Minutes Intravenous Every 24 hours 07/14/24 1030 07/16/24 1204   07/12/24 1600  piperacillin -tazobactam (ZOSYN ) IVPB 3.375 g  Status:  Discontinued        3.375 g 12.5 mL/hr over 240 Minutes Intravenous Every 8 hours 07/12/24 1503 07/14/24 1030   07/12/24 0900  vancomycin  (VANCOREADY) IVPB 1250 mg/250 mL  Status:  Discontinued        1,250 mg 166.7 mL/hr over 90 Minutes Intravenous Every 12 hours 07/11/24 2012 07/12/24 1458   07/11/24 2100  ceFEPIme  (MAXIPIME ) 2 g in sodium chloride  0.9 % 100 mL IVPB  Status:  Discontinued        2 g 200 mL/hr over 30 Minutes Intravenous Every 8 hours 07/11/24 1959 07/12/24 1458   07/11/24 2100  vancomycin  (VANCOREADY) IVPB 2000 mg/400 mL        2,000 mg 200 mL/hr over 120 Minutes Intravenous  Once 07/11/24 2004 07/11/24 2255   07/11/24 1945  cefTRIAXone  (ROCEPHIN ) 1 g in sodium chloride  0.9 % 100 mL IVPB  Status:  Discontinued        1 g 200 mL/hr over 30 Minutes Intravenous  Once 07/11/24 1930 07/11/24 1933   07/11/24 1945  azithromycin  (ZITHROMAX ) 500 mg in sodium chloride  0.9 % 250 mL IVPB  Status:  Discontinued        500 mg 250 mL/hr over 60 Minutes Intravenous  Once 07/11/24 1930 07/11/24 1955   07/11/24 1945  cefTRIAXone  (ROCEPHIN ) 2 g in sodium chloride  0.9 % 100 mL IVPB  Status:  Discontinued        2 g 200 mL/hr over 30 Minutes Intravenous  Once 07/11/24 1933 07/11/24 1955              Family Communication/Anticipated D/C date and plan/Code Status   DVT prophylaxis: enoxaparin  (LOVENOX ) injection 40 mg Start: 07/15/24  2200 Place and maintain sequential compression device Start: 07/14/24 1044     Code Status: Full Code  Family Communication: None Disposition Plan: May need SNF at discharge  Status is: Inpatient Remains inpatient appropriate because: Acute respiratory failure, aspiration pneumonia       Subjective:   Interval events noted.  Galen, physical therapist was at the bedside. Macario, RN, was at the bedside.  Objective:    Vitals:   07/19/24 0700 07/19/24 0754 07/19/24 0800 07/19/24 0916  BP: 112/62  126/73   Pulse: 78 90 93 97  Resp: 16 20 19  (!) 22  Temp:   98.7 F (37.1 C)   TempSrc:   Oral   SpO2: 100% 100% 98% 100%  Weight:      Height:       No data found.   Intake/Output Summary (Last 24 hours) at 07/19/2024 1114 Last data filed at 07/19/2024 9266 Gross per 24 hour  Intake 1504.92 ml  Output 2040 ml  Net -535.08 ml   Filed Weights   07/17/24 0500 07/18/24 0500 07/19/24 0500  Weight: 98.6 kg 101.1 kg 103.2 kg    Exam:  GEN: NAD SKIN: Warm and dry EYES: No pallor or icterus ENT: MMM NG tube in place CV: RRR PULM: Congested cough, bilateral rhonchi, ABD: soft, ND, NT, +BS, rectal tube in place CNS: Alert but disoriented EXT: No edema or tenderness GU: Foley catheter draining amber urine       Data Reviewed:   I have personally reviewed following labs and imaging studies:  Labs: Labs show the following:   Basic Metabolic Panel: Recent Labs  Lab 07/15/24 0307 07/16/24 0334 07/16/24 1608 07/16/24 1611 07/17/24 0222 07/18/24 0450 07/19/24 0559  NA 139 141  --  145 143 143 145  K 3.7 3.2*  --  3.5 3.9 3.1* 3.7  CL 102 104  --  107 105 108 108  CO2 28 28  --  26 27 28  32  GLUCOSE 117* 129*  --  173* 129* 140* 167*  BUN 25* 29*  --  33* 34* 24* 23  CREATININE 0.92 0.99  --  1.27* 1.38* 1.04 0.85  CALCIUM  8.2* 8.1*  --  7.5* 7.9* 7.8* 8.0*  MG 2.4 2.6* 2.4  --  2.6* 2.4  --   PHOS 2.7 2.7  --   --  3.1 2.4* 3.5   GFR Estimated  Creatinine Clearance: 107.6 mL/min (by C-G formula based on SCr of 0.85 mg/dL). Liver Function Tests: Recent Labs  Lab 07/14/24 0535 07/17/24 0222 07/18/24 0450 07/19/24 0559  AST 41  --   --   --   ALT 18  --   --   --   ALKPHOS 31*  --   --   --   BILITOT 1.3*  --   --   --   PROT 6.6  --   --   --   ALBUMIN  3.2* 2.9* 2.6* 2.6*   No results for input(s): LIPASE, AMYLASE in the last 168 hours. Recent Labs  Lab 07/14/24 0535  AMMONIA 21   Coagulation profile No results for input(s): INR, PROTIME in the last 168 hours.  CBC: Recent Labs  Lab 07/15/24 0307 07/16/24 9665 07/17/24 0222 07/17/24 0802 07/18/24 0035 07/18/24 0450 07/18/24 0621 07/18/24 1157 07/19/24 0559  WBC 12.4* 12.6* 31.2*  --   --  17.2*  --   --  14.9*  HGB 8.6* 8.6* 8.9*   < > 8.8* 8.3* 8.6* 9.1* 8.7*  HCT 27.8* 28.9* 29.2*   < > 29.0* 27.6* 28.4* 30.4* 29.6*  MCV 86.9 88.9 88.5  --   --  90.2  --   --  91.9  PLT 227 234 262  --   --  213  --   --  227   < > = values in this interval not displayed.   Cardiac Enzymes: No results for input(s): CKTOTAL, CKMB, CKMBINDEX, TROPONINI in the last 168 hours. BNP (last 3 results) No results for input(s): PROBNP in the last 8760 hours. CBG: Recent Labs  Lab 07/18/24 1619 07/18/24 1942 07/18/24 2337 07/19/24 0353 07/19/24 0737  GLUCAP 155* 137* 120* 153* 151*   D-Dimer: No results for input(s): DDIMER in the last 72 hours. Hgb A1c: No results for input(s): HGBA1C in the last 72 hours. Lipid Profile: Recent Labs    07/17/24 0222  TRIG 68   Thyroid function studies: No results for input(s): TSH, T4TOTAL, T3FREE, THYROIDAB in the last 72 hours.  Invalid input(s): FREET3 Anemia work up: No results for input(s): VITAMINB12, FOLATE, FERRITIN, TIBC, IRON , RETICCTPCT in the last 72 hours. Sepsis Labs: Recent Labs  Lab 07/16/24 0334 07/16/24 1607 07/16/24 1928 07/17/24 0222 07/18/24 0450  07/19/24 0559  WBC 12.6*  --   --  31.2* 17.2* 14.9*  LATICACIDVEN  --  0.8 1.2  --   --   --     Microbiology Recent Results (from the past 240 hours)  Culture, blood (routine x 2)     Status: Abnormal   Collection Time: 07/11/24  7:51 PM   Specimen: BLOOD  Result Value Ref Range Status   Specimen Description   Final    BLOOD LEFT ANTECUBITAL Performed at [facility], [address], [place], [place] [address]    Special Requests   Final    BOTTLES DRAWN AEROBIC AND ANAEROBIC Blood Culture adequate volume Performed at [facility], [address] [place], [place] [address]    Culture  Setup Time   Final    GRAM POSITIVE COCCI AEROBIC BOTTLE ONLY Organism ID to follow CRITICAL RESULT CALLED TO, READ BACK BY AND VERIFIED WITH: NATHAN BELUE, PHARMACY 07/13/24 0003 JL Performed at [facility], [address] [place], [place] [address]    Culture (A)  Final    STAPHYLOCOCCUS EPIDERMIDIS THE SIGNIFICANCE OF ISOLATING THIS ORGANISM FROM A SINGLE SET OF BLOOD CULTURES WHEN MULTIPLE SETS ARE DRAWN IS UNCERTAIN. PLEASE NOTIFY THE MICROBIOLOGY DEPARTMENT WITHIN ONE WEEK IF SPECIATION AND SENSITIVITIES ARE REQUIRED. Performed at [facility], [address] [place], [place] [address]    Report Status 07/14/2024 FINAL  Final  Culture, blood (routine x 2)     Status: None   Collection Time: 07/11/24  7:51 PM   Specimen: BLOOD  Result Value Ref Range Status   Specimen Description BLOOD RIGHT ANTECUBITAL  Final   Special Requests   Final    BOTTLES DRAWN AEROBIC AND ANAEROBIC Blood Culture results may not be optimal due to an inadequate volume of blood received in culture bottles   Culture   Final    NO GROWTH 5 DAYS Performed at [facility], [address] [place], [place] [address]    Report Status 07/16/2024 FINAL  Final  Blood Culture ID Panel (Reflexed)     Status: Abnormal   Collection Time: 07/11/24  7:51 PM  Result Value Ref  Range Status   Enterococcus faecalis NOT DETECTED NOT DETECTED Final   Enterococcus Faecium NOT DETECTED NOT DETECTED Final   Listeria monocytogenes NOT DETECTED NOT DETECTED Final   Staphylococcus species DETECTED (A) NOT DETECTED Final    Comment: CRITICAL RESULT  CALLED TO, READ BACK BY AND VERIFIED WITH: NATHAN BELUE, PHARMACY 07/13/24 0003 JL    Staphylococcus aureus (BCID) NOT DETECTED NOT DETECTED Final   Staphylococcus epidermidis DETECTED (A) NOT DETECTED Final    Comment: Methicillin (oxacillin) resistant coagulase negative staphylococcus. Possible blood culture contaminant (unless isolated from more than one blood culture draw or clinical case suggests pathogenicity). No antibiotic treatment is indicated for blood  culture contaminants. CRITICAL RESULT CALLED TO, READ BACK BY AND VERIFIED WITH: NATHAN BELUE, PHARMACY 07/13/24 0003 JL    Staphylococcus lugdunensis NOT DETECTED NOT DETECTED Final   Streptococcus species NOT DETECTED NOT DETECTED Final   Streptococcus agalactiae NOT DETECTED NOT DETECTED Final   Streptococcus pneumoniae NOT DETECTED NOT DETECTED Final   Streptococcus pyogenes NOT DETECTED NOT DETECTED Final   A.calcoaceticus-baumannii NOT DETECTED NOT DETECTED Final   Bacteroides fragilis NOT DETECTED NOT DETECTED Final   Enterobacterales NOT DETECTED NOT DETECTED Final   Enterobacter cloacae complex NOT DETECTED NOT DETECTED Final   Escherichia coli NOT DETECTED NOT DETECTED Final   Klebsiella aerogenes NOT DETECTED NOT DETECTED Final   Klebsiella oxytoca NOT DETECTED NOT DETECTED Final   Klebsiella pneumoniae NOT DETECTED NOT DETECTED Final   Proteus species NOT DETECTED NOT DETECTED Final   Salmonella species NOT DETECTED NOT DETECTED Final   Serratia marcescens NOT DETECTED NOT DETECTED Final   Haemophilus influenzae NOT DETECTED NOT DETECTED Final   Neisseria meningitidis NOT DETECTED NOT DETECTED Final   Pseudomonas aeruginosa NOT DETECTED NOT DETECTED  Final   Stenotrophomonas maltophilia NOT DETECTED NOT DETECTED Final   Candida albicans NOT DETECTED NOT DETECTED Final   Candida auris NOT DETECTED NOT DETECTED Final   Candida glabrata NOT DETECTED NOT DETECTED Final   Candida krusei NOT DETECTED NOT DETECTED Final   Candida parapsilosis NOT DETECTED NOT DETECTED Final   Candida tropicalis NOT DETECTED NOT DETECTED Final   Cryptococcus neoformans/gattii NOT DETECTED NOT DETECTED Final   Methicillin resistance mecA/C DETECTED (A) NOT DETECTED Final    Comment: CRITICAL RESULT CALLED TO, READ BACK BY AND VERIFIED WITH: NATHAN BELUE, PHARMACY 07/13/24 0003 JL Performed at [facility], [address] [place], [place] [address]   Resp panel by RT-PCR (RSV, Flu A&B, Covid) Anterior Nasal Swab     Status: None   Collection Time: 07/11/24 10:35 PM   Specimen: Anterior Nasal Swab  Result Value Ref Range Status   SARS Coronavirus 2 by RT PCR NEGATIVE NEGATIVE Final    Comment: (NOTE) SARS-CoV-2 target nucleic acids are NOT DETECTED.  The SARS-CoV-2 RNA is generally detectable in upper respiratory specimens during the acute phase of infection. The lowest concentration of SARS-CoV-2 viral copies this assay can detect is 138 copies/mL. A negative result does not preclude SARS-Cov-2 infection and should not be used as the sole basis for treatment or other patient management decisions. A negative result may occur with  improper specimen collection/handling, submission of specimen other than nasopharyngeal swab, presence of viral mutation(s) within the areas targeted by this assay, and inadequate number of viral copies(<138 copies/mL). A negative result must be combined with clinical observations, patient history, and epidemiological information. The expected result is Negative.  Fact Sheet for Patients:  BloggerCourse.com  Fact Sheet for Healthcare Providers:   SeriousBroker.it  This test is no t yet approved or cleared by the [place]  FDA and  has been authorized for detection and/or diagnosis of SARS-CoV-2 by FDA under an Emergency Use Authorization (EUA). This EUA will remain  in  effect (meaning this test can be used) for the duration of the COVID-19 declaration under Section 564(b)(1) of the Act, 21 U.S.C.section 360bbb-3(b)(1), unless the authorization is terminated  or revoked sooner.       Influenza A by PCR NEGATIVE NEGATIVE Final   Influenza B by PCR NEGATIVE NEGATIVE Final    Comment: (NOTE) The Xpert Xpress SARS-CoV-2/FLU/RSV plus assay is intended as an aid in the diagnosis of influenza from Nasopharyngeal swab specimens and should not be used as a sole basis for treatment. Nasal washings and aspirates are unacceptable for Xpert Xpress SARS-CoV-2/FLU/RSV testing.  Fact Sheet for Patients: BloggerCourse.com  Fact Sheet for Healthcare Providers: SeriousBroker.it  This test is not yet approved or cleared by the [place]  FDA and has been authorized for detection and/or diagnosis of SARS-CoV-2 by FDA under an Emergency Use Authorization (EUA). This EUA will remain in effect (meaning this test can be used) for the duration of the COVID-19 declaration under Section 564(b)(1) of the Act, 21 U.S.C. section 360bbb-3(b)(1), unless the authorization is terminated or revoked.     Resp Syncytial Virus by PCR NEGATIVE NEGATIVE Final    Comment: (NOTE) Fact Sheet for Patients: BloggerCourse.com  Fact Sheet for Healthcare Providers: SeriousBroker.it  This test is not yet approved or cleared by the [place]  FDA and has been authorized for detection and/or diagnosis of SARS-CoV-2 by FDA under an Emergency Use Authorization (EUA). This EUA will remain in effect (meaning this test can be used) for  the duration of the COVID-19 declaration under Section 564(b)(1) of the Act, 21 U.S.C. section 360bbb-3(b)(1), unless the authorization is terminated or revoked.  Performed at [facility], [address] [place], [place] [address]   MRSA Next Gen by PCR, Nasal     Status: None   Collection Time: 07/11/24 10:35 PM   Specimen: Nasal Mucosa; Nasal Swab  Result Value Ref Range Status   MRSA by PCR Next Gen NOT DETECTED NOT DETECTED Final    Comment: (NOTE) The GeneXpert MRSA Assay (FDA approved for NASAL specimens only), is one component of a comprehensive MRSA colonization surveillance program. It is not intended to diagnose MRSA infection nor to guide or monitor treatment for MRSA infections. Test performance is not FDA approved in patients less than 7 years old. Performed at [facility], [address] [place], [place] [address]   Respiratory (~20 pathogens) panel by PCR     Status: None   Collection Time: 07/12/24  2:15 PM   Specimen: Nasopharyngeal Swab; Respiratory  Result Value Ref Range Status   Adenovirus NOT DETECTED NOT DETECTED Final   Coronavirus 229E NOT DETECTED NOT DETECTED Final    Comment: (NOTE) The Coronavirus on the Respiratory Panel, DOES NOT test for the novel  Coronavirus (2019 nCoV)    Coronavirus HKU1 NOT DETECTED NOT DETECTED Final   Coronavirus NL63 NOT DETECTED NOT DETECTED Final   Coronavirus OC43 NOT DETECTED NOT DETECTED Final   Metapneumovirus NOT DETECTED NOT DETECTED Final   Rhinovirus / Enterovirus NOT DETECTED NOT DETECTED Final   Influenza A NOT DETECTED NOT DETECTED Final   Influenza B NOT DETECTED NOT DETECTED Final   Parainfluenza Virus 1 NOT DETECTED NOT DETECTED Final   Parainfluenza Virus 2 NOT DETECTED NOT DETECTED Final   Parainfluenza Virus 3 NOT DETECTED NOT DETECTED Final   Parainfluenza Virus 4 NOT DETECTED NOT DETECTED Final   Respiratory Syncytial Virus NOT DETECTED NOT DETECTED Final   Bordetella  pertussis NOT DETECTED NOT  DETECTED Final   Bordetella Parapertussis NOT DETECTED NOT DETECTED Final   Chlamydophila pneumoniae NOT DETECTED NOT DETECTED Final   Mycoplasma pneumoniae NOT DETECTED NOT DETECTED Final    Comment: Performed at [facility], [address] [place], [place] [address]  Culture, Respiratory w Gram Stain     Status: None (Preliminary result)   Collection Time: 07/16/24  4:02 PM   Specimen: Tracheal Aspirate; Respiratory  Result Value Ref Range Status   Specimen Description   Final    TRACHEAL ASPIRATE Performed at [facility], [address] [place], [place] [address]    Special Requests   Final    NONE Performed at [facility], [address] [place], [place] [address]    Gram Stain   Final    RARE WBC PRESENT, PREDOMINANTLY PMN NO ORGANISMS SEEN    Culture   Final    NO GROWTH 2 DAYS Performed at [facility], [address] [place], [place] [address]    Report Status PENDING  Incomplete  MRSA Next Gen by PCR, Nasal     Status: None   Collection Time: 07/17/24  8:26 AM   Specimen: Nasal Mucosa; Nasal Swab  Result Value Ref Range Status   MRSA by PCR Next Gen NOT DETECTED NOT DETECTED Final    Comment: (NOTE) The GeneXpert MRSA Assay (FDA approved for NASAL specimens only), is one component of a comprehensive MRSA colonization surveillance program. It is not intended to diagnose MRSA infection nor to guide or monitor treatment for MRSA infections. Test performance is not FDA approved in patients less than 21 years old. Performed at [facility], [address] [place], [place] [address]   Culture, blood (Routine X 2) w Reflex to ID Panel     Status: None (Preliminary result)   Collection Time: 07/17/24  9:57 AM   Specimen: BLOOD  Result Value Ref Range Status   Specimen Description BLOOD BLOOD LEFT HAND  Final   Special Requests   Final    BOTTLES DRAWN AEROBIC AND ANAEROBIC Blood Culture  adequate volume   Culture   Final    NO GROWTH 2 DAYS Performed at [facility], [address] [place], [place] [address]    Report Status PENDING  Incomplete  Culture, blood (Routine X 2) w Reflex to ID Panel     Status: None (Preliminary result)   Collection Time: 07/17/24  9:57 AM   Specimen: BLOOD  Result Value Ref Range Status   Specimen Description BLOOD BLOOD LEFT WRIST  Final   Special Requests   Final    BOTTLES DRAWN AEROBIC AND ANAEROBIC Blood Culture adequate volume   Culture   Final    NO GROWTH 2 DAYS Performed at [facility], [address], [place], [place] [address]    Report Status PENDING  Incomplete    Procedures and diagnostic studies:  DG Chest Port 1 View Result Date: 07/18/2024 EXAM: 1 VIEW XRAY OF THE CHEST 07/18/2024 04:59:06 AM COMPARISON: 07/16/2024 CLINICAL HISTORY: 427266 Acute respiratory failure with hypoxia (HCC) 427266. Acute respiratory failure with hypoxia FINDINGS: LUNGS AND PLEURA: Nodular opacity within the periphery of the right upper lobe appears similar to CT from 07/11/2024. Please refer to that report for follow-up recommendations. Mild asymmetric hazy opacification over the right mid and right lower lung may be related to positional artifact versus small posterior layering effusion. Mild subsegmental atelectasis in the left base. HEART AND MEDIASTINUM: Stable  cardiomediastinal contours. Previous median sternotomy and CABG procedure. BONES AND SOFT TISSUES: Aortic atherosclerotic calcification. Left IJ catheter is identified with the tip in the projection of the SVC. There is an enteric tube which courses below the field of view. ET tube tip is stable above the carina. IMPRESSION: 1. Stable nodular opacity within the periphery of the right upper lobe, similar to CT from 07/11/2024. Please refer to that report for follow-up recommendations. 2. Mild asymmetric hazy opacification over the right mid and right lower lung, possibly  related to rotational artifact versus small posterior layering effusion. 3. Mild subsegmental atelectasis in the left base. Electronically signed by: Waddell Calk MD 07/18/2024 06:51 AM EDT RP Workstation: [id number]               LOS: 8 days   Cambreigh Dearing  [facility]   Pager on [email or web address]. If 7PM-7AM, please contact night-coverage at [email or web address]     07/19/2024, 11:14 AM

## 2024-07-19 NOTE — Evaluation (Signed)
 Clinical/Bedside Swallow Evaluation Patient Details  Name: [name] MRN: [id number] Date of Birth: 1961-04-26  Today's Date: 07/19/2024 Time: SLP Start Time (ACUTE ONLY): 1030 SLP Stop Time (ACUTE ONLY): 1050 SLP Time Calculation (min) (ACUTE ONLY): 20 min  Past Medical History:  Past Medical History:  Diagnosis Date   COPD (chronic obstructive pulmonary disease) (HCC)    CVA (cerebral infarction)    Headache    mild, since stroke 2012   Hypertension    MI (myocardial infarction) (HCC) 05/09/2012   Reading difficulty    pt reports he reads at about a second grade level   Stroke [facility]) 2012   numbness to left hand    Wears dentures    full upper and lower   Past Surgical History:  Past Surgical History:  Procedure Laterality Date   CAROTID ENDARTERECTOMY Left    2012 or 2013   CHOLECYSTECTOMY N/A 07/26/2016   Procedure: LAPAROSCOPIC CHOLECYSTECTOMY;  Surgeon: Charlie FORBES Fell, MD;  Location: [facility] ORS;  Service: General;  Laterality: N/A;   COLONOSCOPY N/A 05/08/2024   Procedure: COLONOSCOPY;  Surgeon: Jinny Carmine, MD;  Location: [facility] ENDOSCOPY;  Service: Endoscopy;  Laterality: N/A;   COLONOSCOPY WITH PROPOFOL  N/A 02/09/2022   Procedure: COLONOSCOPY WITH PROPOFOL ;  Surgeon: Jinny Carmine, MD;  Location: [facility] ENDOSCOPY;  Service: Endoscopy;  Laterality: N/A;   CORONARY ARTERY BYPASS GRAFT  05/10/2012   3 vessel   CORONARY STENT INTERVENTION N/A 07/02/2024   Procedure: CORONARY STENT INTERVENTION;  Surgeon: Katina Albright, MD;  Location: [facility] INVASIVE CV LAB;  Service: Cardiovascular;  Laterality: N/A;   CORONARY ULTRASOUND/IVUS N/A 07/02/2024   Procedure: Coronary Ultrasound/IVUS;  Surgeon: Katina Albright, MD;  Location: [facility] INVASIVE CV LAB;  Service: Cardiovascular;  Laterality: N/A;   ENTEROSCOPY N/A 05/16/2023   Procedure: ENTEROSCOPY;  Surgeon: Therisa Bi, MD;  Location: [facility] ENDOSCOPY;  Service: Gastroenterology;  Laterality: N/A;   ENTEROSCOPY N/A 06/01/2023   Procedure:  ENTEROSCOPY;  Surgeon: Unk Corinn Skiff, MD;  Location: [facility] ENDOSCOPY;  Service: Gastroenterology;  Laterality: N/A;   ESOPHAGOGASTRODUODENOSCOPY  05/13/2023   Procedure: ESOPHAGOGASTRODUODENOSCOPY (EGD);  Surgeon: Jinny Carmine, MD;  Location: [facility] ENDOSCOPY;  Service: Endoscopy;;   ESOPHAGOGASTRODUODENOSCOPY (EGD) WITH PROPOFOL  N/A 10/28/2022   Procedure: ESOPHAGOGASTRODUODENOSCOPY (EGD) WITH PROPOFOL ;  Surgeon: Onita Elspeth Sharper, DO;  Location: [facility] ENDOSCOPY;  Service: Gastroenterology;  Laterality: N/A;   GIVENS CAPSULE STUDY  05/13/2023   Procedure: GIVENS CAPSULE STUDY;  Surgeon: Jinny Carmine, MD;  Location: [facility] ENDOSCOPY;  Service: Endoscopy;;   GIVENS CAPSULE STUDY  06/01/2023   Procedure: GIVENS CAPSULE STUDY;  Surgeon: Unk Corinn Skiff, MD;  Location: [facility] ENDOSCOPY;  Service: Gastroenterology;;   LEFT HEART CATH AND CORS/GRAFTS ANGIOGRAPHY N/A 07/02/2024   Procedure: LEFT HEART CATH AND CORS/GRAFTS ANGIOGRAPHY;  Surgeon: Katina Albright, MD;  Location: [facility] INVASIVE CV LAB;  Service: Cardiovascular;  Laterality: N/A;   HPI:  per MD Progress Note, Jael Kostick is a 63 y.o. male with medical history significant of  GIB, alcohol  abuse, COPD on 2 L oxygen, hypertension, CAD with prior CABG and recent Stent placement,, severe aortic stenosis, HLD, stroke, dementia, chronic diastolic CHF, who presented to the hospital on 07/11/2024 with SOB, cough productive of yellowish sputum, sided chest pain.     Patient was recently hospitalized from 7/16 - 7/17 for elective LHC. Pt was s/p of successful direct stenting of distal SVG to PDA.     CTA negative for PE, showed patchy infiltration in the right upper lobe.  Patient is admitted  to telemetry bed as inpatient.         He was admitted to the hospitalist service for COPD exacerbation and acute hypoxic respiratory failure.  He developed worsening respiratory distress shortly after admission.  He was placed on BiPAP and was transferred to the ICU for  further management.  He has a became agitated and this was attributed to acute alcohol  withdrawal syndrome.  He was started on IV Precedex  infusion for this.  He developed acute toxic metabolic encephalopathy and worsening respiratory failure likely from aspiration pneumonia.  He required intubation and mechanical ventilation in the ICU.  He became hypotensive with induction meds despite IV fluids and IV Levophed  infusion.  There was brief loss of pulse but ROSC was achieved after 2 minutes of CPR and 1 dose of epinephrine . DG Chest 07/18/24: Stable nodular opacity within the periphery of the right upper lobe, similar  to CT from 07/11/2024. Please refer to that report for follow-up  recommendations.  2. Mild asymmetric hazy opacification over the right mid and right lower lung,  possibly related to rotational artifact versus small posterior layering  effusion.  3. Mild subsegmental atelectasis in the left base.    Assessment / Plan / Recommendation  Clinical Impression  Pt seen for bedside swallow evaluation in the setting of recent extubation. Pt currently on 10L nasal canula- with O2 saturation at 100 for duration of session. Pt upright in chair upon therapist approach. RN reporting use of suction to manage secretion and congestion. Weak cough, congested. Voice weak, hoarse/low intensity. Oral care completed, revealing reduced natural dentition. Trials completed of ice chips and tsp of thin liquid- all resultant in immediate throat clear/cough and wet vocal quality. Vital signs maintained; however, given persistent, overt s/sx of aspiration with minimal amounts of PO intake and reduced secretion clearance- further trials not completed.   Recommend continuation of strict NPO with frequent oral care for swallow stimulation and oral hygiene. Medication and nutrition via alternative means. MD and RN aware of recommendations. SLP will follow to determine PO readiness.  SLP Visit Diagnosis: Dysphagia, unspecified  (R13.10) (suspect s/p intubation)    Aspiration Risk  Moderate aspiration risk    Diet Recommendation   NPO  Medication Administration: Via alternative means    Other  Recommendations Oral Care Recommendations: Oral care QID;Staff/trained caregiver to provide oral care     Assistance Recommended at Discharge    Functional Status Assessment Patient has had a recent decline in their functional status and demonstrates the ability to make significant improvements in function in a reasonable and predictable amount of time.  Frequency and Duration min 2x/week  2 weeks       Prognosis Prognosis for improved oropharyngeal function: Good Barriers to Reach Goals:  (deconditioning)      Swallow Study   General Date of Onset: 07/19/24 HPI: per MD Progress Note, Haruto Demaria is a 63 y.o. male with medical history significant of  GIB, alcohol  abuse, COPD on 2 L oxygen, hypertension, CAD with prior CABG and recent Stent placement,, severe aortic stenosis, HLD, stroke, dementia, chronic diastolic CHF, who presented to the hospital on 07/11/2024 with SOB, cough productive of yellowish sputum, sided chest pain.     Patient was recently hospitalized from 7/16 - 7/17 for elective LHC. Pt was s/p of successful direct stenting of distal SVG to PDA.     CTA negative for PE, showed patchy infiltration in the right upper lobe.  Patient is admitted to telemetry bed as inpatient.  He was admitted to the hospitalist service for COPD exacerbation and acute hypoxic respiratory failure.  He developed worsening respiratory distress shortly after admission.  He was placed on BiPAP and was transferred to the ICU for further management.  He has a became agitated and this was attributed to acute alcohol  withdrawal syndrome.  He was started on IV Precedex  infusion for this.  He developed acute toxic metabolic encephalopathy and worsening respiratory failure likely from aspiration pneumonia.  He required intubation and  mechanical ventilation in the ICU.  He became hypotensive with induction meds despite IV fluids and IV Levophed  infusion.  There was brief loss of pulse but ROSC was achieved after 2 minutes of CPR and 1 dose of epinephrine . DG Chest 07/18/24: Stable nodular opacity within the periphery of the right upper lobe, similar  to CT from 07/11/2024. Please refer to that report for follow-up  recommendations.  2. Mild asymmetric hazy opacification over the right mid and right lower lung,  possibly related to rotational artifact versus small posterior layering  effusion.  3. Mild subsegmental atelectasis in the left base. Type of Study: Bedside Swallow Evaluation Previous Swallow Assessment: none in chart Diet Prior to this Study: NPO;Cortrak/Small bore NG tube Temperature Spikes Noted: No Respiratory Status: Nasal cannula (HFNC 10L) History of Recent Intubation: Yes Total duration of intubation (days): 2 days (intubated 7/30-8/1) Date extubated: 07/18/24 Behavior/Cognition: Alert;Cooperative;Pleasant mood Oral Cavity Assessment: Within Functional Limits Oral Care Completed by SLP: Yes Oral Cavity - Dentition: Edentulous Vision:  (total assist) Self-Feeding Abilities: Needs assist Patient Positioning: Upright in chair Baseline Vocal Quality: Low vocal intensity;Hoarse Volitional Cough: Weak;Congested Volitional Swallow: Unable to elicit    Oral/Motor/Sensory Function Overall Oral Motor/Sensory Function: Within functional limits   Ice Chips Ice chips: Impaired Presentation: Spoon Oral Phase Impairments:  (grossly intact) Pharyngeal Phase Impairments: Cough - Immediate;Throat Clearing - Immediate;Wet Vocal Quality   Thin Liquid Thin Liquid: Impaired Presentation: Spoon Oral Phase Impairments:  (none) Pharyngeal  Phase Impairments: Cough - Immediate;Throat Clearing - Immediate;Wet Vocal Quality    Nectar Thick Nectar Thick Liquid: Not tested   Honey Thick Honey Thick Liquid: Not tested   Puree  Puree: Not tested   Solid     Solid: Not tested     [place] Angelica Frandsen Clapp, MS, CCC-SLP Speech Language Pathologist Rehab Services; [facility] [place] [phone] (ascom)   [place] J Clapp 07/19/2024,1:08 PM

## 2024-07-19 NOTE — Evaluation (Signed)
 Physical Therapy Evaluation Patient Details Name: [name] MRN: [id number] DOB: 11-03-1961 Today's Date: 07/19/2024  History of Present Illness  63 year old male patient admitted to the ICU with severe alcohol  withdrawal Dts requiring multiple doses of Phenobarb and eventually precedex  drip. Unfortunately course complicated by an aspriation event requiring intubation and mechanical ventilation with course c/b peri intubation brief cardiac arrest requiring one round of CPR.  Pt ultimately needing to be extubated ~24 hours.  Clinical Impression  Pt lethargic but did wake and was willing to participate with PT.  This PT saw him last month and he was able to easily circumambulate the nurses' station w/o an AD, today was vastly different.  He showed great effort but needed assist with bed mobility, rising to standing and only just managed to take a few shuffling steps from the bed to the recliner before needing to sit.  Pt is far from his baseline and will require ongoing PT to address functional limitations.       If plan is discharge home, recommend the following: Two people to help with walking and/or transfers;A lot of help with bathing/dressing/bathroom;Assistance with cooking/housework;Assist for transportation;Help with stairs or ramp for entrance   Can travel by private vehicle   No    Equipment Recommendations  (TBD at next venue)  Recommendations for Other Services       Functional Status Assessment Patient has had a recent decline in their functional status and demonstrates the ability to make significant improvements in function in a reasonable and predictable amount of time.     Precautions / Restrictions Precautions Precautions: Fall Recall of Precautions/Restrictions: Intact Restrictions Weight Bearing Restrictions Per Provider Order: No      Mobility  Bed Mobility Overal bed mobility: Needs Assistance Bed Mobility: Supine to Sit     Supine to sit: Mod assist      General bed mobility comments: Pt showed good effort in trying to get toward EOB and to sitting, too weak to do so w/o direct assist from PT    Transfers Overall transfer level: Needs assistance Equipment used: Rolling walker (2 wheels) Transfers: Sit to/from Stand Sit to Stand: Mod assist           General transfer comment: Initially struggled with self selected set up, PT gave many cues for foot placement, weight shift, UE use (PT holding walker down while he used b/l UEs on it).  Able to stand 2 seperate times with min/modA each time and plenty of cuing and close guarding.  Was able to use UEs on recliner arm rests (pillows in seat to raise height)    Ambulation/Gait Ambulation/Gait assistance: Mod assist Gait Distance (Feet): 4 Feet Assistive device: Rolling walker (2 wheels)         General Gait Details: Pt was able to take a few small, shuffling, labored steps to get from bed to recliner.  Did not have any LOBs but knees were clearly weak and he struggled to keep them straight (no overt buckling) - pt was highly reliant on the walker.  Stairs            Wheelchair Mobility     Tilt Bed    Modified Rankin (Stroke Patients Only)       Balance Overall balance assessment: Independent Sitting-balance support: Feet supported Sitting balance-Leahy Scale: Normal     Standing balance support: No upper extremity supported Standing balance-Leahy Scale: Good Standing balance comment: R lateral lean with minimal occasional stagger stepping w/o  LOBs during dynamic standing/ambulation tasks                             Pertinent Vitals/Pain Pain Assessment Pain Assessment: Faces Faces Pain Scale: Hurts little more Pain Location: report soreness/pain from CPR and from rectal tube    Home Living Family/patient expects to be discharged to:: Private residence Living Arrangements: Alone Available Help at Discharge: Friend(s);Available PRN/intermittently  (minimal assistance available)   Home Access: Level entry (reports he got kicked out of his rental home, I guess I'm on the streets now.)         Home Equipment: Agricultural consultant (2 wheels)      Prior Function Prior Level of Function : Independent/Modified Independent             Mobility Comments: reports 5 or 6 falls in the last 6 months ADLs Comments: 2L O2 baseline PRN at home; eats out vs makes simple meals, friend's wife assists with med mgmt but pt often forgets to take AM/PM meds     Extremity/Trunk Assessment   Upper Extremity Assessment Upper Extremity Assessment: Generalized weakness (minimal grip b/l, unable to elevate >50 b/l, R grossly 2+/5, L grossly 3-/5)    Lower Extremity Assessment Lower Extremity Assessment: Generalized weakness (grossly 4-/5, decreased quality of movement)       Communication   Communication Communication: No apparent difficulties    Cognition Arousal: Lethargic Behavior During Therapy: WFL for tasks assessed/performed   PT - Cognitive impairments: No apparent impairments                         Following commands: Intact Following commands impaired: Follows one step commands with increased time     Cueing Cueing Techniques: Verbal cues     General Comments General comments (skin integrity, edema, etc.): Pt lethargic but eager to try some activity with PT.    Exercises     Assessment/Plan    PT Assessment Patient needs continued PT services  PT Problem List Decreased strength;Decreased range of motion;Decreased activity tolerance;Decreased balance;Decreased mobility;Decreased coordination;Decreased safety awareness;Cardiopulmonary status limiting activity;Decreased cognition       PT Treatment Interventions Gait training;Functional mobility training;Therapeutic activities;Therapeutic exercise;Balance training;Patient/family education;DME instruction;Neuromuscular re-education    PT Goals (Current goals  can be found in the Care Plan section)  Acute Rehab PT Goals Patient Stated Goal: get stronger PT Goal Formulation: With patient Time For Goal Achievement: 08/01/24 Potential to Achieve Goals: Fair    Frequency Min 2X/week     Co-evaluation               AM-PAC PT 6 Clicks Mobility  Outcome Measure Help needed turning from your back to your side while in a flat bed without using bedrails?: A Little Help needed moving from lying on your back to sitting on the side of a flat bed without using bedrails?: A Lot Help needed moving to and from a bed to a chair (including a wheelchair)?: Total Help needed standing up from a chair using your arms (e.g., wheelchair or bedside chair)?: Total Help needed to walk in hospital room?: Total Help needed climbing 3-5 steps with a railing? : Total 6 Click Score: 9    End of Session Equipment Utilized During Treatment: Gait belt Activity Tolerance: Patient tolerated treatment well Patient left: with chair alarm set;with call bell/phone within reach;with nursing/sitter in room Nurse Communication: Mobility status PT Visit Diagnosis: Muscle  weakness (generalized) (M62.81);Difficulty in walking, not elsewhere classified (R26.2);Unsteadiness on feet (R26.81)    Time: 9159-9079 PT Time Calculation (min) (ACUTE ONLY): 40 min   Charges:   PT Evaluation $PT Eval Moderate Complexity: 1 Mod PT Treatments $Therapeutic Activity: 8-22 mins PT General Charges $$ ACUTE PT VISIT: 1 Visit         Carmin JONELLE Deed, DPT 07/19/2024, 11:20 AM

## 2024-07-19 NOTE — Plan of Care (Signed)
  Problem: Clinical Measurements: Goal: Cardiovascular complication will be avoided Outcome: Progressing   Problem: Nutrition: Goal: Adequate nutrition will be maintained Outcome: Progressing   Problem: Coping: Goal: Level of anxiety will decrease Outcome: Progressing

## 2024-07-19 NOTE — Progress Notes (Signed)
 Patient ID: [name], male   DOB: [birth year], 64 y.o.   MRN: [id number] [facility]    SUBJECTIVE: Sitting up in bed feels reasonably well still tremulous denies any pain no shortness of breath.  Patient states she feels somewhat better   Vitals:   07/19/24 0700 07/19/24 0754 07/19/24 0800 07/19/24 0916  BP: 112/62  126/73   Pulse: 78 90 93 97  Resp: 16 20 19  (!) 22  Temp:   98.7 F (37.1 C)   TempSrc:   Oral   SpO2: 100% 100% 98% 100%  Weight:      Height:         Intake/Output Summary (Last 24 hours) at 07/19/2024 9074 Last data filed at 07/19/2024 9266 Gross per 24 hour  Intake 1753.86 ml  Output 2040 ml  Net -286.14 ml      PHYSICAL EXAM  General: Well developed, well nourished, in no acute distress HEENT:  Normocephalic and atramatic Neck:  No JVD.  Lungs: Clear bilaterally to auscultation and percussion. Heart: HRRR . Normal S1 and S2 without gallops or murmurs.  Abdomen: Bowel sounds are positive, abdomen soft and non-tender  Msk:  Back normal, normal gait. Normal strength and tone for age. Extremities: No clubbing, cyanosis or edema.   Neuro: Alert and oriented X 3. Psych:  Good affect, responds appropriately   LABS: Basic Metabolic Panel: Recent Labs    07/17/24 0222 07/18/24 0450 07/19/24 0559  NA 143 143 145  K 3.9 3.1* 3.7  CL 105 108 108  CO2 27 28 32  GLUCOSE 129* 140* 167*  BUN 34* 24* 23  CREATININE 1.38* 1.04 0.85  CALCIUM  7.9* 7.8* 8.0*  MG 2.6* 2.4  --   PHOS 3.1 2.4* 3.5   Liver Function Tests: Recent Labs    07/18/24 0450 07/19/24 0559  ALBUMIN  2.6* 2.6*   No results for input(s): LIPASE, AMYLASE in the last 72 hours. CBC: Recent Labs    07/18/24 0450 07/18/24 0621 07/18/24 1157 07/19/24 0559  WBC 17.2*  --   --  14.9*  HGB 8.3*   < > 9.1* 8.7*  HCT 27.6*   < > 30.4* 29.6*  MCV 90.2  --   --  91.9  PLT 213  --   --  227   < > = values in this interval not displayed.   Cardiac Enzymes: No results for  input(s): CKTOTAL, CKMB, CKMBINDEX, TROPONINI in the last 72 hours. BNP: Invalid input(s): POCBNP D-Dimer: No results for input(s): DDIMER in the last 72 hours. Hemoglobin A1C: No results for input(s): HGBA1C in the last 72 hours. Fasting Lipid Panel: Recent Labs    07/17/24 0222  TRIG 68   Thyroid Function Tests: No results for input(s): TSH, T4TOTAL, T3FREE, THYROIDAB in the last 72 hours.  Invalid input(s): FREET3 Anemia Panel: No results for input(s): VITAMINB12, FOLATE, FERRITIN, TIBC, IRON , RETICCTPCT in the last 72 hours.  DG Chest Port 1 View Result Date: 07/18/2024 EXAM: 1 VIEW XRAY OF THE CHEST 07/18/2024 04:59:06 AM COMPARISON: 07/16/2024 CLINICAL HISTORY: 427266 Acute respiratory failure with hypoxia (HCC) 427266. Acute respiratory failure with hypoxia FINDINGS: LUNGS AND PLEURA: Nodular opacity within the periphery of the right upper lobe appears similar to CT from 07/11/2024. Please refer to that report for follow-up recommendations. Mild asymmetric hazy opacification over the right mid and right lower lung may be related to positional artifact versus small posterior layering effusion. Mild subsegmental atelectasis in the left base. HEART AND MEDIASTINUM: Stable cardiomediastinal  contours. Previous median sternotomy and CABG procedure. BONES AND SOFT TISSUES: Aortic atherosclerotic calcification. Left IJ catheter is identified with the tip in the projection of the SVC. There is an enteric tube which courses below the field of view. ET tube tip is stable above the carina. IMPRESSION: 1. Stable nodular opacity within the periphery of the right upper lobe, similar to CT from 07/11/2024. Please refer to that report for follow-up recommendations. 2. Mild asymmetric hazy opacification over the right mid and right lower lung, possibly related to rotational artifact versus small posterior layering effusion. 3. Mild subsegmental atelectasis in the left  base. Electronically signed by: Waddell Calk MD 07/18/2024 06:51 AM EDT RP Workstation: [id number]     Echo preserved overall left ventricular function EF around 60% significant aortic valve stenosis  TELEMETRY: Sinus rhythm rate of 86 nonspecific ST-T wave changes:  ASSESSMENT AND PLAN:  Principal Problem:   Acute respiratory distress Active Problems:   Hyperlipidemia   Cognitive impairment   Alcohol  abuse   Essential hypertension   CAD (coronary artery disease)   Stroke (HCC)   Iron  deficiency anemia   Acute on chronic diastolic CHF (congestive heart failure) (HCC)   COPD exacerbation (HCC)   Elevated lactic acid level   Lobar pneumonia (HCC)   Metabolic acidosis, increased anion gap   Myocardial injury    Plan Agree with continued therapy ICU level management care History of acute hypoxic respiratory failure off of mechanical support continue supplemental oxygen inhalers critical care and pulmonary management Alcohol  withdrawal improving continue current therapy Acute on chronic HFpEF left pulm edema severe aortic stenosis off of mechanical support supplemental oxygen feels much improved History of non-STEMI no recent angina continue conservative therapy Status post recent PCI and stent July 02, 2024 DES SVG to PDA continue DAPT Elevated troponin up to 6007 no further chest pain off of pressors resting comfortably in bed History of significant as anemia we will try to maintain hemoglobin above 8 hopefully closer to 10 and has had lower GI bleed requiring AVM cautery  AHA/ACC ACS Clinical Performance & Quality Measures: Aspirin  prescribed? - Yes ADP Receptor Inhibitor (Plavix /Clopidogrel , Brilinta/Ticagrelor or Effient/Prasugrel) prescribed (includes medically managed patients)? - Yes Beta Blocker prescribed? - Yes High Intensity Statin (Lipitor 40-80mg  or Crestor  20-40mg ) prescribed? - Yes EF assessed during THIS hospitalization? - No - patient with recent echo  performed on 05/10/2024.  For EF <40%, was ACEI/ARB prescribed? - Not Applicable (EF >/= 40%) For EF <40%, Aldosterone Antagonist (Spironolactone  or Eplerenone) prescribed? - Not Applicable (EF >/= 40%) Cardiac Rehab Phase II ordered (including medically managed patients)? - Yes, Patient with recent outpatient LHC performed on 07/16, referred to cardiac rehab during that time on 07/04/24.    [name], MD, 07/19/2024 9:25 AM

## 2024-07-19 NOTE — Progress Notes (Signed)
   07/19/24 2000  Spiritual Encounters  Type of Visit Initial  Care provided to: Patient  Referral source Other (comment) (Spiritual Consult)  Reason for visit Routine spiritual support  OnCall Visit No  Spiritual Framework  Presenting Themes Other (comment) (Life Review)  Values/beliefs Believes in God  Community/Connection None  Strengths Seeking to return to better choices  Needs/Challenges/Barriers Alcohol   Patient Stress Factors Health changes;Family relationships  Interventions  Spiritual Care Interventions Made Compassionate presence;Reflective listening;Prayer   Chaplain responded to a nurses request to visit with the patient.  I met with [name] who was alert and welcomed me into his space. [name] shared his life, both the highs and the lows and expressed his regret of much that happen. We explored briefly how he might reconnect with family which he longs for as well as connecting with a faith community again. Before I departed we prayed together for his renewed relationship with God.   Carley [facility]  [phone].

## 2024-07-19 NOTE — Evaluation (Signed)
 Occupational Therapy Evaluation Patient Details Name: [name] MRN: [id number] DOB: 12/26/1960 Today's Date: 07/19/2024   History of Present Illness   63 year old male patient admitted to the ICU with severe alcohol  withdrawal Dts requiring multiple doses of Phenobarb and eventually precedex  drip. Unfortunately course complicated by an aspriation event requiring intubation and mechanical ventilation with course c/b peri intubation brief cardiac arrest requiring one round of CPR.  Pt ultimately needing to be extubated ~24 hours.     Clinical Impressions Pt seen for OT evaluation this date, Pt in the chair on arrival.  Pt alert and cooperative during evaluation.  Pt reports he lives alone in a rental house, the house is 2 story but pt able to access everything he needs on the first level.  He reports he has a home aide who assists with a bath one time a week and PT who comes in 2 times a week to help with exercises.  He reports ambulating independently prior to admission and was still able to drive.  Pt was able to dress himself and would go out for almost all meals otherwise he could make a sandwich.  He reports light housekeeping such as sweeping or vacuuming and did laundry occasionally.  His landlord's wife assisted with managing his medication but he was not always consistent with taking.  He presents with muscle weakness, repeated falls, decreased transfers, functional mobility, balance and decreased ability to perform self care and IADL tasks.  He would benefit from skilled OT services to maximize his safety and independence in necessary daily tasks.       If plan is discharge home, recommend the following:   A lot of help with walking and/or transfers;A lot of help with bathing/dressing/bathroom;Assistance with cooking/housework;Direct supervision/assist for medications management;Assist for transportation     Functional Status Assessment   Patient has had a recent decline in their  functional status and demonstrates the ability to make significant improvements in function in a reasonable and predictable amount of time.     Equipment Recommendations   None recommended by OT     Recommendations for Other Services         Precautions/Restrictions   Precautions Precautions: Fall Restrictions Weight Bearing Restrictions Per Provider Order: No     Mobility Bed Mobility               General bed mobility comments: Mod assist per PT, pt up to chair on arrival this date    Transfers Overall transfer level: Needs assistance Equipment used: Rolling walker (2 wheels) Transfers: Sit to/from Stand Sit to Stand: Mod assist           General transfer comment: Requires cues for safety, increased assist with lines and tubes      Balance                                           ADL either performed or assessed with clinical judgement   ADL Overall ADL's : Needs assistance/impaired Eating/Feeding: Sitting;Independent Eating/Feeding Details (indicate cue type and reason): Nursing requested red foam for built up handles, issued one for using on utensils when needed to increase surface grip and able to demonstrate with modified independence. Grooming: Wash/dry hands;Wash/dry face;Oral care;Set up;Sitting   Upper Body Bathing: Moderate assistance Upper Body Bathing Details (indicate cue type and reason): multiole lines and tubes to manage Lower  Body Bathing: Total assistance   Upper Body Dressing : Minimal assistance   Lower Body Dressing: Maximal assistance                       Vision   Vision Assessment?: Wears glasses for reading Additional Comments: Pt reports he requires glasses to see for all tasks however he reports he has lost his glasses (unsure of time frame of losing them)     Perception         Praxis         Pertinent Vitals/Pain Pain Assessment Pain Assessment: Faces Pain Score: 4  Faces  Pain Scale: Hurts little more     Extremity/Trunk Assessment Upper Extremity Assessment Upper Extremity Assessment: Generalized weakness;RUE deficits/detail;LUE deficits/detail RUE Deficits / Details: Pt with decreased active strength in rigth UE, 3/5, left 4/5.  Able to demonstrate full fisting on the right, opposition to all but small finger.  Left able to demo full fist with full opposition.   Lower Extremity Assessment Lower Extremity Assessment: Defer to PT evaluation   Cervical / Trunk Assessment Cervical / Trunk Assessment: Normal   Communication Communication Communication: No apparent difficulties   Cognition Arousal: Alert Behavior During Therapy: WFL for tasks assessed/performed Cognition: No family/caregiver present to determine baseline       Memory impairment (select all impairments): Short-term memory   Executive functioning impairment (select all impairments): Reasoning, Problem solving                     Following commands impaired: Follows one step commands with increased time     Cueing  General Comments       Pt seen for sample handwriting, difficulty holding pen, added red foam to pen and pt able to hold and write his name with fair legibility.  Pt asking for search a word puzzles to do while he is in the hospital.  Will attempt to locate some suitable for patient and drop them off later in the day.   Exercises     Shoulder Instructions      Home Living Family/patient expects to be discharged to:: Private residence Living Arrangements: Alone Available Help at Discharge: Friend(s);Available PRN/intermittently Type of Home: House Home Access: Level entry     Home Layout: Two level;Able to live on main level with bedroom/bathroom     Bathroom Shower/Tub: Chief Strategy Officer: Standard Bathroom Accessibility: Yes How Accessible: Accessible via walker Home Equipment: Rolling Walker (2 wheels);BSC/3in1;Shower seat    Additional Comments: Pt reports he rents a guest house on his landlord's property, has been living there for the last 10 years.      Prior Functioning/Environment Prior Level of Function : Independent/Modified Independent;Needs assist       Physical Assist : ADLs (physical)   ADLs (physical): Bathing Mobility Comments: pt reports multiple falls in the last 6 months. ADLs Comments: 2L O2 baseline PRN at home; pt reports he drives to eat out for the majority of his meals but is able to make simple meals such as sandwiches. His landlord's wife assists with med mgmt but pt often forgets to take AM/PM meds. Pt reports he has an aide who comes in 1 time a week to do his bath, denies bathing on any other days and does not do sponge baths.  He states he has a spa tub and has difficulty getting in it and is afraid of falling. He reports he is able to  do laundry (located in the garage) but does not do it very often and wears most clothes repeatedly.  He is able to sweep and vacuum.  He does not have teeth and reports he uses mouthwash one time a day.  He denies using any adaptive equipment for ambulation PTA, reports he has a PT come to the house 2 times a week for exercises. Pt reports he has 2 levels to his home but has everything he needs on the first level.    OT Problem List: Cardiopulmonary status limiting activity;Decreased cognition;Decreased activity tolerance;Decreased safety awareness;Decreased knowledge of use of DME or AE;Impaired balance (sitting and/or standing);Decreased strength;Pain   OT Treatment/Interventions: Self-care/ADL training;Therapeutic exercise;Therapeutic activities;Neuromuscular education;Cognitive remediation/compensation;Energy conservation;DME and/or AE instruction;Patient/family education;Balance training      OT Goals(Current goals can be found in the care plan section)   Acute Rehab OT Goals Patient Stated Goal: pt would like to get better and be as independent  as possible. OT Goal Formulation: With patient Time For Goal Achievement: 08/02/24 Potential to Achieve Goals: Good ADL Goals Pt Will Perform Lower Body Bathing: with min assist Pt Will Perform Lower Body Dressing: with min assist Pt Will Transfer to Toilet: with min assist   OT Frequency:  Min 2X/week    Co-evaluation              AM-PAC OT 6 Clicks Daily Activity     Outcome Measure Help from another person eating meals?: None Help from another person taking care of personal grooming?: None Help from another person toileting, which includes using toliet, bedpan, or urinal?: A Lot Help from another person bathing (including washing, rinsing, drying)?: A Lot Help from another person to put on and taking off regular upper body clothing?: A Little Help from another person to put on and taking off regular lower body clothing?: A Lot 6 Click Score: 17   End of Session Equipment Utilized During Treatment: Oxygen  Activity Tolerance: Patient tolerated treatment well Patient left: with call bell/phone within reach;in chair  OT Visit Diagnosis: History of falling (Z91.81);Other abnormalities of gait and mobility (R26.89);Unsteadiness on feet (R26.81);Muscle weakness (generalized) (M62.81);Repeated falls (R29.6);Pain                Time: 8945-8860 OT Time Calculation (min): 45 min Charges:  OT General Charges $OT Visit: 1 Visit OT Evaluation $OT Eval High Complexity: 1 High OT Treatments $Self Care/Home Management : 23-37 mins   Shayanne Gomm T Ramone Gander, OTR/L, CLT Jailan Trimm 07/19/2024, 1:50 PM

## 2024-07-20 DIAGNOSIS — R0603 Acute respiratory distress: Secondary | ICD-10-CM | POA: Diagnosis not present

## 2024-07-20 LAB — CBC
HCT: 31.6 % — ABNORMAL LOW (ref 39.0–52.0)
Hemoglobin: 9.2 g/dL — ABNORMAL LOW (ref 13.0–17.0)
MCH: 27.1 pg (ref 26.0–34.0)
MCHC: 29.1 g/dL — ABNORMAL LOW (ref 30.0–36.0)
MCV: 92.9 fL (ref 80.0–100.0)
Platelets: 229 K/uL (ref 150–400)
RBC: 3.4 MIL/uL — ABNORMAL LOW (ref 4.22–5.81)
RDW: 16.5 % — ABNORMAL HIGH (ref 11.5–15.5)
WBC: 12.8 K/uL — ABNORMAL HIGH (ref 4.0–10.5)
nRBC: 0 % (ref 0.0–0.2)

## 2024-07-20 LAB — GLUCOSE, CAPILLARY
Glucose-Capillary: 135 mg/dL — ABNORMAL HIGH (ref 70–99)
Glucose-Capillary: 141 mg/dL — ABNORMAL HIGH (ref 70–99)
Glucose-Capillary: 150 mg/dL — ABNORMAL HIGH (ref 70–99)
Glucose-Capillary: 158 mg/dL — ABNORMAL HIGH (ref 70–99)
Glucose-Capillary: 159 mg/dL — ABNORMAL HIGH (ref 70–99)
Glucose-Capillary: 188 mg/dL — ABNORMAL HIGH (ref 70–99)

## 2024-07-20 LAB — RENAL FUNCTION PANEL
Albumin: 3 g/dL — ABNORMAL LOW (ref 3.5–5.0)
Anion gap: 9 (ref 5–15)
BUN: 27 mg/dL — ABNORMAL HIGH (ref 8–23)
CO2: 29 mmol/L (ref 22–32)
Calcium: 8.6 mg/dL — ABNORMAL LOW (ref 8.9–10.3)
Chloride: 109 mmol/L (ref 98–111)
Creatinine, Ser: 1.01 mg/dL (ref 0.61–1.24)
GFR, Estimated: >60 mL/min (ref >60)
Glucose, Bld: 178 mg/dL — ABNORMAL HIGH (ref 70–99)
Phosphorus: 3.1 mg/dL (ref 2.5–4.6)
Potassium: 4.4 mmol/L (ref 3.5–5.1)
Sodium: 147 mmol/L — ABNORMAL HIGH (ref 135–145)

## 2024-07-20 NOTE — Progress Notes (Incomplete)
 Patient ID: [name], male   DOB: Jul 01, 1961, 63 y.o.   MRN: [id number] [facility]    SUBJECTIVE: ***   Vitals:   07/20/24 1200 07/20/24 1300 07/20/24 1400 07/20/24 1422  BP: 139/74 122/69 131/74   Pulse: 97 88 89   Resp: 18 13 16    Temp:      TempSrc:      SpO2: 95% 98% 99% 98%  Weight:      Height:         Intake/Output Summary (Last 24 hours) at 07/20/2024 1601 Last data filed at 07/20/2024 1500 Gross per 24 hour  Intake 2634.84 ml  Output 750 ml  Net 1884.84 ml      PHYSICAL EXAM  General: Well developed, well nourished, in no acute distress HEENT:  Normocephalic and atramatic Neck:  No JVD.  Lungs: Clear bilaterally to auscultation and percussion. Heart: HRRR . Normal S1 and S2 without gallops or murmurs.  Abdomen: Bowel sounds are positive, abdomen soft and non-tender  Msk:  Back normal, normal gait. Normal strength and tone for age. Extremities: No clubbing, cyanosis or edema.   Neuro: Alert and oriented X 3. Psych:  Good affect, responds appropriately   LABS: Basic Metabolic Panel: Recent Labs    07/18/24 0450 07/19/24 0559 07/20/24 0531  NA 143 145 147*  K 3.1* 3.7 4.4  CL 108 108 109  CO2 28 32 29  GLUCOSE 140* 167* 178*  BUN 24* 23 27*  CREATININE 1.04 0.85 1.01  CALCIUM  7.8* 8.0* 8.6*  MG 2.4 2.4  --   PHOS 2.4* 3.5 3.1   Liver Function Tests: Recent Labs    07/19/24 0559 07/20/24 0531  ALBUMIN 2.6* 3.0*   No results for input(s): LIPASE, AMYLASE in the last 72 hours. CBC: Recent Labs    07/19/24 0559 07/20/24 0531  WBC 14.9* 12.8*  HGB 8.7* 9.2*  HCT 29.6* 31.6*  MCV 91.9 92.9  PLT 227 229   Cardiac Enzymes: No results for input(s): CKTOTAL, CKMB, CKMBINDEX, TROPONINI in the last 72 hours. BNP: Invalid input(s): POCBNP D-Dimer: No results for input(s): DDIMER in the last 72 hours. Hemoglobin A1C: No results for input(s): HGBA1C in the last 72 hours. Fasting Lipid Panel: No results for input(s):  CHOL, HDL, LDLCALC, TRIG, CHOLHDL, LDLDIRECT in the last 72 hours. Thyroid Function Tests: No results for input(s): TSH, T4TOTAL, T3FREE, THYROIDAB in the last 72 hours.  Invalid input(s): FREET3 Anemia Panel: No results for input(s): VITAMINB12, FOLATE, FERRITIN, TIBC, IRON , RETICCTPCT in the last 72 hours.  DG Chest Port 1 View Result Date: 07/19/2024 CLINICAL DATA:  Dyspnea EXAM: PORTABLE CHEST 1 VIEW COMPARISON:  07/18/2024 FINDINGS: Two frontal views of the chest demonstrate an enteric catheter passing below diaphragm, tip excluded by collimation. Left internal jugular catheter tip overlies superior vena cava unchanged. 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 The cardiac silhouette is stable. Mild central vascular congestion, with a trace right pleural effusion unchanged since prior exam. The nodular density projecting over the right upper lateral chest on prior study is less well seen on this exam. This may be due to positioning. No pneumothorax. No acute bony abnormalities. IMPRESSION: 1. Pulmonary vascular congestion and small right pleural effusion, unchanged since prior study. 2. The nodular consolidation within the right upper lateral chest seen on prior x-ray and CT is not well seen on this exam, likely due to patient positioning. Electronically Signed   By: Ozell Daring M.D.   On: 07/19/2024 17:34  Echo ***  TELEMETRY: ***:  ASSESSMENT AND PLAN:  Principal Problem:   Acute respiratory distress Active Problems:   Hyperlipidemia   Cognitive impairment   Alcohol  abuse   Essential hypertension   CAD (coronary artery disease)   Stroke (HCC)   Iron  deficiency anemia   Acute on chronic diastolic CHF (congestive heart failure) (HCC)   COPD exacerbation (HCC)   Elevated lactic acid level   Lobar pneumonia (HCC)   Metabolic acidosis, increased anion gap   Myocardial injury    1. ***   Douglas JONETTA Lovelace, MD, PHD FACC 07/20/2024 4:01  PM

## 2024-07-20 NOTE — Plan of Care (Signed)
  Problem: Education: Goal: Knowledge of General Education information will improve Description: Including pain rating scale, medication(s)/side effects and non-pharmacologic comfort measures Outcome: Progressing   Problem: Clinical Measurements: Goal: Will remain free from infection Outcome: Progressing   Problem: Activity: Goal: Risk for activity intolerance will decrease Outcome: Progressing   Problem: Pain Managment: Goal: General experience of comfort will improve and/or be controlled Outcome: Progressing   Problem: Skin Integrity: Goal: Risk for impaired skin integrity will decrease Outcome: Progressing   Problem: Education: Goal: Knowledge of the prescribed therapeutic regimen will improve Outcome: Progressing   Problem: Activity: Goal: Ability to tolerate increased activity will improve Outcome: Progressing   Problem: Role Relationship: Goal: Method of communication will improve Outcome: Progressing

## 2024-07-20 NOTE — Progress Notes (Signed)
 Speech Language Pathology Treatment: Dysphagia  Patient Details Name: [name] MRN: [id number] DOB: 03/02/1961 Today's Date: 07/20/2024 Time: 1002-1020 SLP Time Calculation (min) (ACUTE ONLY): 18 min  Assessment / Plan / Recommendation Clinical Impression  Pt seen for PO trials. Pt alert, pleasantly confused. On 35L/min O2 via [place]. NGT present. Voice strong and clear at times; however, concern for reduced secretion management given audible wet respirations and coughing with pt utilizing [place]. Pt given trials of ice chips, thin liquids (via tsp, cup), and puree. Oral phase appeared functional; however, s/sx concerning for suspected pharyngeal dysphagia including mult swallows, immediate and delayed throat clearing across all trials. Increased wet vocal quality with cup sips of thin liquids. Pt's s/sx appeared to be reduced in frequency with trials of ice chips. At present, a safe oral diet cannot be recommended. Recommend NPO with alternate means of nutrition/hydration/medication. For comfort, pt may have single ice chips in moderation for comfort. SLP to f/u next date for re-assessment and consideration for instrumental swallowing evaluation. RN made aware of this above.   HPI HPI: per MD Progress Note, [name] is a 63 y.o. male with medical history significant of  GIB, alcohol  abuse, COPD on 2 L oxygen, hypertension, CAD with prior CABG and recent Stent placement,, severe aortic stenosis, HLD, stroke, dementia, chronic diastolic CHF, who presented to the hospital on 07/11/2024 with SOB, cough productive of yellowish sputum, sided chest pain.     Patient was recently hospitalized from 7/16 - 7/17 for elective LHC. Pt was s/p of successful direct stenting of distal SVG to PDA.     CTA negative for PE, showed patchy infiltration in the right upper lobe.  Patient is admitted to telemetry bed as inpatient.         He was admitted to the hospitalist service for COPD exacerbation and acute hypoxic  respiratory failure.  He developed worsening respiratory distress shortly after admission.  He was placed on BiPAP and was transferred to the ICU for further management.  He has a became agitated and this was attributed to acute alcohol  withdrawal syndrome.  He was started on IV Precedex  infusion for this.  He developed acute toxic metabolic encephalopathy and worsening respiratory failure likely from aspiration pneumonia.  He required intubation and mechanical ventilation in the ICU.  He became hypotensive with induction meds despite IV fluids and IV Levophed  infusion.  There was brief loss of pulse but ROSC was achieved after 2 minutes of CPR and 1 dose of epinephrine . DG Chest 07/18/24: Stable nodular opacity within the periphery of the right upper lobe, similar  to CT from 07/11/2024. Please refer to that report for follow-up  recommendations.  2. Mild asymmetric hazy opacification over the right mid and right lower lung,  possibly related to rotational artifact versus small posterior layering  effusion.  3. Mild subsegmental atelectasis in the left base.      SLP Plan  Continue with current plan of care          Recommendations  Diet recommendations: NPO (x ice chips in moderation following oral care) Medication Administration: Via alternative means                  Oral care QID;Staff/trained caregiver to provide oral care   Frequent or constant Supervision/Assistance Dysphagia, pharyngeal phase (R13.13)     Continue with current plan of care    [name], M.S., CCC-SLP Speech-Language Pathologist [facility] [phone] [place])  [name]  07/20/2024, 11:13 AM

## 2024-07-20 NOTE — Progress Notes (Signed)
 Progress Note    [name]  FMW:[id number] DOB: 10-25-1961  DOA: 07/11/2024 PCP: Osa Geralds, NP      Brief Narrative:    Medical records reviewed and are as summarized below:  [name] is a 63 y.o. male with medical history significant of  GIB, alcohol  abuse, COPD on 2 L oxygen, hypertension, CAD with prior CABG and recent Stent placement,, severe aortic stenosis, HLD, stroke, dementia, chronic diastolic CHF, who presented to the hospital on 07/11/2024 with SOB, cough productive of yellowish sputum, sided chest pain.   Patient was recently hospitalized from 7/16 - 7/17 for elective LHC. Pt was s/p of successful direct stenting of distal SVG to PDA.  CTA negative for PE, showed patchy infiltration in the right upper lobe.  Patient is admitted to telemetry bed as inpatient.    He was admitted to the hospitalist service for COPD exacerbation and acute hypoxic respiratory failure.  He developed worsening respiratory distress shortly after admission.  He was placed on BiPAP and was transferred to the ICU for further management.  He has a became agitated and this was attributed to acute alcohol  withdrawal syndrome.  He was started on IV Precedex  infusion for this. He developed acute toxic metabolic encephalopathy and worsening respiratory failure likely from aspiration pneumonia.  He required intubation and mechanical ventilation in the ICU.  He became hypotensive with induction meds despite IV fluids and IV Levophed  infusion.  There was brief loss of pulse but ROSC was achieved after 2 minutes of CPR and 1 dose of epinephrine .    Significant Hospital Events: Including procedures, antibiotic start and stop dates in addition to other pertinent events   7/25: admit with respiratory failure 7/26: clinically decompensated, worsening respiratory status initiated on BiPAP 7/27: on nasal cannula, reports anxiety and tremors consistent with EtOH withdrawal.  Received a total of 15  mg/kg of phenobarbital , however he remained agitated requiring low dose precedex  gtt.  7/28: Pt resting in bed no agitation on precedex  gtt @0 .5 mcg/min.  Orders placed to transfuse 1 unit of pRBC's hgb 7.2 goal hgb 8.0.  No signs of active bleeding  7/29: Pt remains encephalopathic with severe DT's requiring precedex  gtt.  NGT placed to initiate tube feeds and administer medications  7/30: Earlier this morning with vomiting, concern for aspiration event as now with increased WOB and increasing FiO2 requirements (up to 4L). Remains encephalopathic on Precedex .  Respiratory status continued to decline, progressed to agonal respirations, Required INTUBATION for airway protection.  Hypotensive with induction meds despite IV fluids and Levophed  resulting in brief loss of pulse (ROSC obtained after 2 minutes of CPR and 1 epi).  Central line placed due to vasopressors. 7/31: Overnight with episode on non-sustained SVT which converted spontaneously, remains on Amiodarone  gtt per Cardiology.  Nursing reported black tarry stools overnight, Hgb remains stable.   Levophed  weaned off, on minimal vent settings, will perform WUA and SBT as mental status allows.  With fever of 101 F overnight and WBC nearly doubled, tracheal aspirate results pending, will check blood cultures and UA, broaden to empiric Vancomycin  and Zosyn  pending culture results.   08/01:  No significant events reported overnight. Received 1 unit pRBC's overnight for Hgb 7.5 with appropriate response to 8.8 and has since remained stable with no reports of bleeding from nursing.  Fevers resolved, WBC improved with broadening to Zosyn  yesterday, cultures currently with no growth.  Weaned off levophed .  On minimal vent support, passed WUA and SBT,  EXTUBATED TO HHFNC.  Diuresed with 20 mg IV Lasix  x1 dose with extubation, needs aggressive pulmonary toilet.      Assessment/Plan:   Principal Problem:   Acute respiratory distress Active Problems:   COPD  exacerbation (HCC)   Lobar pneumonia (HCC)   Elevated lactic acid level   Metabolic acidosis, increased anion gap   Essential hypertension   Acute on chronic diastolic CHF (congestive heart failure) (HCC)   Hyperlipidemia   Stroke (HCC)   CAD (coronary artery disease)   Iron  deficiency anemia   Alcohol  abuse   Cognitive impairment   Myocardial injury   Nutrition Problem: Inadequate oral intake Etiology: lethargy/confusion  Signs/Symptoms: NPO status   Body mass index is 29.87 kg/m.  (Obesity)    Acute on chronic hypoxic respiratory failure: He still requiring oxygen via HHFNC, 35% at 35 L/min.  Taper down oxygen as able.   S/p extubation on 07/18/2024.  Was intubated on 07/16/2024.   Aspiration pneumonia: Plan to complete IV Zosyn  on 07/21/2024. Nebulized Mucomyst  was started on 07/19/2024 for excess secretions. Leukocytosis improving Hypotension: Resolved Suspected septic shock: Resolved.  He is off of IV Levophed  infusion.     COPD exacerbation: Continue bronchodilators.  Completed steroids.   Acute toxic metabolic encephalopathy: Continue supportive care Alcohol  use disorder with alcohol  withdrawal syndrome: He is off of IV Precedex  infusion.  Continue IV thiamine .   Brief cardiac arrest due to severe hypotension from induction meds on 07/16/2024 (ROSC achieved after 2 minutes of CPR and 1 dose of epinephrine ).    Acute NSTEMI, elevated troponins (peaked at 5,766), CAD, recent elective left heart cath s/p DES to SVG to PDA 07/02/2024,  history of CABG x 3 (May 2013): Continue aspirin , Plavix  and rosuvastatin .   Acute on chronic diastolic CHF, severe aortic stenosis: S/p treatment with IV Lasix . 2D echo on 05/10/2024 showed EF estimated at 60 to 65%, grade 1 diastolic dysfunction, severe aortic valve stenosis.   Chronic anemia, history of GI bleed: No evidence of overt GI bleeding thus far.  Continue IV Protonix .  H&H stable. He was evaluated by gastroenterologist on  07/13/2024.  GI signed off. 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 recommended by speech therapist for now.  Continue enteral nutrition via NG tube   Hypernatremia: Sodium up from 145-147.  Continue free water  via NG tube.  Continue enteral nutrition.  Monitor BMP. AKI: Resolved    Hyperglycemia: Glucose levels are stable.  Hemoglobin A1c was 5.3 on 10/28/2022   Comorbidities include hypertension, hyperlipidemia, history of stroke     Diet Order             Diet NPO time specified  Diet effective now                            Consultants: Medical illustrator Cardiologist  Procedures: Intubated on 07/16/2024 and extubated on 07/18/2024 Left IJ central line placed on 07/16/2024    Medications:    amiodarone   400 mg Per Tube BID   Followed by   NOREEN ON 07/28/2024] amiodarone   200 mg Per Tube Daily   ascorbic acid   500 mg Per Tube Daily   aspirin   81 mg Per Tube Daily   Chlorhexidine  Gluconate Cloth  6 each Topical Q0600   cholecalciferol   2,000 Units Per Tube Daily   clopidogrel   75 mg Per Tube Daily  docusate  100 mg Per Tube BID   enoxaparin  (LOVENOX ) injection  40 mg Subcutaneous QHS   folic acid   1 mg Per Tube Daily   free water   30 mL Per Tube Q4H   ipratropium-albuterol   3 mL Nebulization Q6H   iron  polysaccharides  150 mg Per Tube Daily   multivitamin with minerals  1 tablet Per Tube Daily   mouth rinse  15 mL Mouth Rinse 4 times per day   pantoprazole  (PROTONIX ) IV  40 mg Intravenous Q12H   polyethylene glycol  17 g Per Tube Daily   rosuvastatin   20 mg Per Tube Daily   sodium chloride  HYPERTONIC  4 mL Nebulization BID   [START ON 07/21/2024] thiamine  (VITAMIN B1) injection  100 mg Intravenous Daily   Continuous Infusions:  feeding supplement (PIVOT 1.5 CAL) 60 mL/hr at 07/20/24 0715   piperacillin -tazobactam (ZOSYN )  IV 3.375 g (07/20/24  0913)     Anti-infectives (From admission, onward)    Start     Dose/Rate Route Frequency Ordered Stop   07/17/24 1000  piperacillin -tazobactam (ZOSYN ) IVPB 3.375 g        3.375 g 12.5 mL/hr over 240 Minutes Intravenous Every 8 hours 07/17/24 0811     07/17/24 1000  vancomycin  (VANCOREADY) IVPB 2000 mg/400 mL  Status:  Discontinued        2,000 mg 200 mL/hr over 120 Minutes Intravenous  Once 07/17/24 0811 07/17/24 1026   07/14/24 1200  cefTRIAXone  (ROCEPHIN ) 2 g in sodium chloride  0.9 % 100 mL IVPB        2 g 200 mL/hr over 30 Minutes Intravenous Every 24 hours 07/14/24 1030 07/16/24 1204   07/12/24 1600  piperacillin -tazobactam (ZOSYN ) IVPB 3.375 g  Status:  Discontinued        3.375 g 12.5 mL/hr over 240 Minutes Intravenous Every 8 hours 07/12/24 1503 07/14/24 1030   07/12/24 0900  vancomycin  (VANCOREADY) IVPB 1250 mg/250 mL  Status:  Discontinued        1,250 mg 166.7 mL/hr over 90 Minutes Intravenous Every 12 hours 07/11/24 2012 07/12/24 1458   07/11/24 2100  ceFEPIme  (MAXIPIME ) 2 g in sodium chloride  0.9 % 100 mL IVPB  Status:  Discontinued        2 g 200 mL/hr over 30 Minutes Intravenous Every 8 hours 07/11/24 1959 07/12/24 1458   07/11/24 2100  vancomycin  (VANCOREADY) IVPB 2000 mg/400 mL        2,000 mg 200 mL/hr over 120 Minutes Intravenous  Once 07/11/24 2004 07/11/24 2255   07/11/24 1945  cefTRIAXone  (ROCEPHIN ) 1 g in sodium chloride  0.9 % 100 mL IVPB  Status:  Discontinued        1 g 200 mL/hr over 30 Minutes Intravenous  Once 07/11/24 1930 07/11/24 1933   07/11/24 1945  azithromycin  (ZITHROMAX ) 500 mg in sodium chloride  0.9 % 250 mL IVPB  Status:  Discontinued        500 mg 250 mL/hr over 60 Minutes Intravenous  Once 07/11/24 1930 07/11/24 1955   07/11/24 1945  cefTRIAXone  (ROCEPHIN ) 2 g in sodium chloride  0.9 % 100 mL IVPB  Status:  Discontinued        2 g 200 mL/hr over 30 Minutes Intravenous  Once 07/11/24 1933 07/11/24 1955              Family  Communication/Anticipated D/C date and plan/Code Status   DVT prophylaxis: enoxaparin  (LOVENOX ) injection 40 mg Start: 07/15/24 2200 Place and maintain sequential compression device Start:  07/14/24 1044     Code Status: Full Code  Family Communication: None Disposition Plan: Plan to discharge to SNF   Status is: Inpatient Remains inpatient appropriate because: Acute respiratory failure, aspiration pneumonia       Subjective:   Interval events noted.  No complaints.  Bobetta, Chief Operating Officer, Charity fundraiser, were at the bedside.  Objective:    Vitals:   07/20/24 0400 07/20/24 0500 07/20/24 0600 07/20/24 0800  BP: 120/68 113/71 115/70   Pulse: 94 88 95   Resp: 15 15 19    Temp: 98.5 F (36.9 C)   97.9 F (36.6 C)  TempSrc: Oral   Oral  SpO2: 98% 99% 97%   Weight:  99.9 kg    Height:       No data found.   Intake/Output Summary (Last 24 hours) at 07/20/2024 1214 Last data filed at 07/20/2024 0949 Gross per 24 hour  Intake 2119.67 ml  Output 750 ml  Net 1369.67 ml   Filed Weights   07/18/24 0500 07/19/24 0500 07/20/24 0500  Weight: 101.1 kg 103.2 kg 99.9 kg    Exam:  GEN: NAD SKIN: Warm and dry EYES: EOMI ENT: MMM, NG tube in place CV: RRR PULM: Bilateral rhonchi ABD: soft, ND, NT, +BS CNS: AAO x 3, non focal EXT: No edema or tenderness           Data Reviewed:   I have personally reviewed following labs and imaging studies:  Labs: Labs show the following:   Basic Metabolic Panel: Recent Labs  Lab 07/16/24 0334 07/16/24 1608 07/16/24 1611 07/17/24 0222 07/18/24 0450 07/19/24 0559 07/20/24 0531  NA 141  --  145 143 143 145 147*  K 3.2*  --  3.5 3.9 3.1* 3.7 4.4  CL 104  --  107 105 108 108 109  CO2 28  --  26 27 28  32 29  GLUCOSE 129*  --  173* 129* 140* 167* 178*  BUN 29*  --  33* 34* 24* 23 27*  CREATININE 0.99  --  1.27* 1.38* 1.04 0.85 1.01  CALCIUM  8.1*  --  7.5* 7.9* 7.8* 8.0* 8.6*  MG 2.6* 2.4  --  2.6* 2.4 2.4  --   PHOS 2.7  --   --   3.1 2.4* 3.5 3.1   GFR Estimated Creatinine Clearance: 89.2 mL/min (by C-G formula based on SCr of 1.01 mg/dL). Liver Function Tests: Recent Labs  Lab 07/14/24 0535 07/17/24 0222 07/18/24 0450 07/19/24 0559 07/20/24 0531  AST 41  --   --   --   --   ALT 18  --   --   --   --   ALKPHOS 31*  --   --   --   --   BILITOT 1.3*  --   --   --   --   PROT 6.6  --   --   --   --   ALBUMIN 3.2* 2.9* 2.6* 2.6* 3.0*   No results for input(s): LIPASE, AMYLASE in the last 168 hours. Recent Labs  Lab 07/14/24 0535  AMMONIA 21   Coagulation profile No results for input(s): INR, PROTIME in the last 168 hours.  CBC: Recent Labs  Lab 07/16/24 0334 07/17/24 0222 07/17/24 0802 07/18/24 0450 07/18/24 0621 07/18/24 1157 07/19/24 0559 07/20/24 0531  WBC 12.6* 31.2*  --  17.2*  --   --  14.9* 12.8*  HGB 8.6* 8.9*   < > 8.3* 8.6* 9.1* 8.7* 9.2*  HCT 28.9* 29.2*   < > 27.6* 28.4* 30.4* 29.6* 31.6*  MCV 88.9 88.5  --  90.2  --   --  91.9 92.9  PLT 234 262  --  213  --   --  227 229   < > = values in this interval not displayed.   Cardiac Enzymes: No results for input(s): CKTOTAL, CKMB, CKMBINDEX, TROPONINI in the last 168 hours. BNP (last 3 results) No results for input(s): PROBNP in the last 8760 hours. CBG: Recent Labs  Lab 07/19/24 1943 07/19/24 2340 07/20/24 0305 07/20/24 0746 07/20/24 1114  GLUCAP 141* 137* 159* 150* 158*   D-Dimer: No results for input(s): DDIMER in the last 72 hours. Hgb A1c: No results for input(s): HGBA1C in the last 72 hours. Lipid Profile: No results for input(s): CHOL, HDL, LDLCALC, TRIG, CHOLHDL, LDLDIRECT in the last 72 hours.  Thyroid function studies: No results for input(s): TSH, T4TOTAL, T3FREE, THYROIDAB in the last 72 hours.  Invalid input(s): FREET3 Anemia work up: No results for input(s): VITAMINB12, FOLATE, FERRITIN, TIBC, IRON , RETICCTPCT in the last 72 hours. Sepsis  Labs: Recent Labs  Lab 07/16/24 1607 07/16/24 1928 07/17/24 0222 07/18/24 0450 07/19/24 0559 07/20/24 0531  WBC  --   --  31.2* 17.2* 14.9* 12.8*  LATICACIDVEN 0.8 1.2  --   --   --   --     Microbiology Recent Results (from the past 240 hours)  Culture, blood (routine x 2)     Status: Abnormal   Collection Time: 07/11/24  7:51 PM   Specimen: BLOOD  Result Value Ref Range Status   Specimen Description   Final    BLOOD LEFT ANTECUBITAL Performed at [facility], [address], [place], [place] [address]    Special Requests   Final    BOTTLES DRAWN AEROBIC AND ANAEROBIC Blood Culture adequate volume Performed at [facility], [address] [place], [place] [address]    Culture  Setup Time   Final    GRAM POSITIVE COCCI AEROBIC BOTTLE ONLY Organism ID to follow CRITICAL RESULT CALLED TO, READ BACK BY AND VERIFIED WITH: NATHAN BELUE, PHARMACY 07/13/24 0003 JL Performed at [facility], [address] [place], [place] [address]    Culture (A)  Final    STAPHYLOCOCCUS EPIDERMIDIS THE SIGNIFICANCE OF ISOLATING THIS ORGANISM FROM A SINGLE SET OF BLOOD CULTURES WHEN MULTIPLE SETS ARE DRAWN IS UNCERTAIN. PLEASE NOTIFY THE MICROBIOLOGY DEPARTMENT WITHIN ONE WEEK IF SPECIATION AND SENSITIVITIES ARE REQUIRED. Performed at [facility], [address] [place], [place] [address]    Report Status 07/14/2024 FINAL  Final  Culture, blood (routine x 2)     Status: None   Collection Time: 07/11/24  7:51 PM   Specimen: BLOOD  Result Value Ref Range Status   Specimen Description BLOOD RIGHT ANTECUBITAL  Final   Special Requests   Final    BOTTLES DRAWN AEROBIC AND ANAEROBIC Blood Culture results may not be optimal due to an inadequate volume of blood received in culture bottles   Culture   Final    NO GROWTH 5 DAYS Performed at [facility], [address] [place], [place] [address]    Report Status 07/16/2024 FINAL  Final  Blood  Culture ID Panel (Reflexed)     Status: Abnormal   Collection Time: 07/11/24  7:51 PM  Result Value Ref Range Status   Enterococcus faecalis NOT DETECTED NOT DETECTED Final   Enterococcus Faecium NOT  DETECTED NOT DETECTED Final   Listeria monocytogenes NOT DETECTED NOT DETECTED Final   Staphylococcus species DETECTED (A) NOT DETECTED Final    Comment: CRITICAL RESULT CALLED TO, READ BACK BY AND VERIFIED WITH: NATHAN BELUE, PHARMACY 07/13/24 0003 JL    Staphylococcus aureus (BCID) NOT DETECTED NOT DETECTED Final   Staphylococcus epidermidis DETECTED (A) NOT DETECTED Final    Comment: Methicillin (oxacillin) resistant coagulase negative staphylococcus. Possible blood culture contaminant (unless isolated from more than one blood culture draw or clinical case suggests pathogenicity). No antibiotic treatment is indicated for blood  culture contaminants. CRITICAL RESULT CALLED TO, READ BACK BY AND VERIFIED WITH: NATHAN BELUE, PHARMACY 07/13/24 0003 JL    Staphylococcus lugdunensis NOT DETECTED NOT DETECTED Final   Streptococcus species NOT DETECTED NOT DETECTED Final   Streptococcus agalactiae NOT DETECTED NOT DETECTED Final   Streptococcus pneumoniae NOT DETECTED NOT DETECTED Final   Streptococcus pyogenes NOT DETECTED NOT DETECTED Final   A.calcoaceticus-baumannii NOT DETECTED NOT DETECTED Final   Bacteroides fragilis NOT DETECTED NOT DETECTED Final   Enterobacterales NOT DETECTED NOT DETECTED Final   Enterobacter cloacae complex NOT DETECTED NOT DETECTED Final   Escherichia coli NOT DETECTED NOT DETECTED Final   Klebsiella aerogenes NOT DETECTED NOT DETECTED Final   Klebsiella oxytoca NOT DETECTED NOT DETECTED Final   Klebsiella pneumoniae NOT DETECTED NOT DETECTED Final   Proteus species NOT DETECTED NOT DETECTED Final   Salmonella species NOT DETECTED NOT DETECTED Final   Serratia marcescens NOT DETECTED NOT DETECTED Final   Haemophilus influenzae NOT DETECTED NOT DETECTED Final    Neisseria meningitidis NOT DETECTED NOT DETECTED Final   Pseudomonas aeruginosa NOT DETECTED NOT DETECTED Final   Stenotrophomonas maltophilia NOT DETECTED NOT DETECTED Final   Candida albicans NOT DETECTED NOT DETECTED Final   Candida auris NOT DETECTED NOT DETECTED Final   Candida glabrata NOT DETECTED NOT DETECTED Final   Candida krusei NOT DETECTED NOT DETECTED Final   Candida parapsilosis NOT DETECTED NOT DETECTED Final   Candida tropicalis NOT DETECTED NOT DETECTED Final   Cryptococcus neoformans/gattii NOT DETECTED NOT DETECTED Final   Methicillin resistance mecA/C DETECTED (A) NOT DETECTED Final    Comment: CRITICAL RESULT CALLED TO, READ BACK BY AND VERIFIED WITH: NATHAN BELUE, PHARMACY 07/13/24 0003 JL Performed at [facility], [address] [place], [place] [address]   Resp panel by RT-PCR (RSV, Flu A&B, Covid) Anterior Nasal Swab     Status: None   Collection Time: 07/11/24 10:35 PM   Specimen: Anterior Nasal Swab  Result Value Ref Range Status   SARS Coronavirus 2 by RT PCR NEGATIVE NEGATIVE Final    Comment: (NOTE) SARS-CoV-2 target nucleic acids are NOT DETECTED.  The SARS-CoV-2 RNA is generally detectable in upper respiratory specimens during the acute phase of infection. The lowest concentration of SARS-CoV-2 viral copies this assay can detect is 138 copies/mL. A negative result does not preclude SARS-Cov-2 infection and should not be used as the sole basis for treatment or other patient management decisions. A negative result may occur with  improper specimen collection/handling, submission of specimen other than nasopharyngeal swab, presence of viral mutation(s) within the areas targeted by this assay, and inadequate number of viral copies(<138 copies/mL). A negative result must be combined with clinical observations, patient history, and epidemiological information. The expected result is Negative.  Fact Sheet for Patients:   BloggerCourse.com  Fact Sheet for Healthcare Providers:  SeriousBroker.it  This test is no t yet approved or cleared by the  [place]  FDA and  has been authorized for detection and/or diagnosis of SARS-CoV-2 by FDA under an Emergency Use Authorization (EUA). This EUA will remain  in effect (meaning this test can be used) for the duration of the COVID-19 declaration under Section 564(b)(1) of the Act, 21 U.S.C.section 360bbb-3(b)(1), unless the authorization is terminated  or revoked sooner.       Influenza A by PCR NEGATIVE NEGATIVE Final   Influenza B by PCR NEGATIVE NEGATIVE Final    Comment: (NOTE) The Xpert Xpress SARS-CoV-2/FLU/RSV plus assay is intended as an aid in the diagnosis of influenza from Nasopharyngeal swab specimens and should not be used as a sole basis for treatment. Nasal washings and aspirates are unacceptable for Xpert Xpress SARS-CoV-2/FLU/RSV testing.  Fact Sheet for Patients: BloggerCourse.com  Fact Sheet for Healthcare Providers: SeriousBroker.it  This test is not yet approved or cleared by the [place]  FDA and has been authorized for detection and/or diagnosis of SARS-CoV-2 by FDA under an Emergency Use Authorization (EUA). This EUA will remain in effect (meaning this test can be used) for the duration of the COVID-19 declaration under Section 564(b)(1) of the Act, 21 U.S.C. section 360bbb-3(b)(1), unless the authorization is terminated or revoked.     Resp Syncytial Virus by PCR NEGATIVE NEGATIVE Final    Comment: (NOTE) Fact Sheet for Patients: BloggerCourse.com  Fact Sheet for Healthcare Providers: SeriousBroker.it  This test is not yet approved or cleared by the [place]  FDA and has been authorized for detection and/or diagnosis of SARS-CoV-2 by FDA under an Emergency Use  Authorization (EUA). This EUA will remain in effect (meaning this test can be used) for the duration of the COVID-19 declaration under Section 564(b)(1) of the Act, 21 U.S.C. section 360bbb-3(b)(1), unless the authorization is terminated or revoked.  Performed at [facility] [place], [address] [place], [place] [address]   MRSA Next Gen by PCR, Nasal     Status: None   Collection Time: 07/11/24 10:35 PM   Specimen: Nasal Mucosa; Nasal Swab  Result Value Ref Range Status   MRSA by PCR Next Gen NOT DETECTED NOT DETECTED Final    Comment: (NOTE) The GeneXpert MRSA Assay (FDA approved for NASAL specimens only), is one component of a comprehensive MRSA colonization surveillance program. It is not intended to diagnose MRSA infection nor to guide or monitor treatment for MRSA infections. Test performance is not FDA approved in patients less than 12 years old. Performed at [facility], [address] [place], [place] [address]   Respiratory (~20 pathogens) panel by PCR     Status: None   Collection Time: 07/12/24  2:15 PM   Specimen: Nasopharyngeal Swab; Respiratory  Result Value Ref Range Status   Adenovirus NOT DETECTED NOT DETECTED Final   Coronavirus 229E NOT DETECTED NOT DETECTED Final    Comment: (NOTE) The Coronavirus on the Respiratory Panel, DOES NOT test for the novel  Coronavirus (2019 nCoV)    Coronavirus HKU1 NOT DETECTED NOT DETECTED Final   Coronavirus NL63 NOT DETECTED NOT DETECTED Final   Coronavirus OC43 NOT DETECTED NOT DETECTED Final   Metapneumovirus NOT DETECTED NOT DETECTED Final   Rhinovirus / Enterovirus NOT DETECTED NOT DETECTED Final   Influenza A NOT DETECTED NOT DETECTED Final   Influenza B NOT DETECTED NOT DETECTED Final   Parainfluenza Virus 1 NOT DETECTED NOT DETECTED Final   Parainfluenza Virus 2 NOT DETECTED NOT DETECTED Final   Parainfluenza Virus 3 NOT DETECTED NOT DETECTED Final  Parainfluenza Virus 4 NOT DETECTED NOT  DETECTED Final   Respiratory Syncytial Virus NOT DETECTED NOT DETECTED Final   Bordetella pertussis NOT DETECTED NOT DETECTED Final   Bordetella Parapertussis NOT DETECTED NOT DETECTED Final   Chlamydophila pneumoniae NOT DETECTED NOT DETECTED Final   Mycoplasma pneumoniae NOT DETECTED NOT DETECTED Final    Comment: Performed at [facility], [address] [place], [place] [address]  Culture, Respiratory w Gram Stain     Status: None   Collection Time: 07/16/24  4:02 PM   Specimen: Tracheal Aspirate; Respiratory  Result Value Ref Range Status   Specimen Description   Final    TRACHEAL ASPIRATE Performed at [facility], [address] [place], [place] [address]    Special Requests   Final    NONE Performed at [facility], [address] [place], [place] [address]    Gram Stain   Final    RARE WBC PRESENT, PREDOMINANTLY PMN NO ORGANISMS SEEN    Culture   Final    NO GROWTH 2 DAYS Performed at [facility], [address] [place], [place] [address]    Report Status 07/19/2024 FINAL  Final  MRSA Next Gen by PCR, Nasal     Status: None   Collection Time: 07/17/24  8:26 AM   Specimen: Nasal Mucosa; Nasal Swab  Result Value Ref Range Status   MRSA by PCR Next Gen NOT DETECTED NOT DETECTED Final    Comment: (NOTE) The GeneXpert MRSA Assay (FDA approved for NASAL specimens only), is one component of a comprehensive MRSA colonization surveillance program. It is not intended to diagnose MRSA infection nor to guide or monitor treatment for MRSA infections. Test performance is not FDA approved in patients less than 33 years old. Performed at [facility], [address] [place], [place] [address]   Culture, blood (Routine X 2) w Reflex to ID Panel     Status: None (Preliminary result)   Collection Time: 07/17/24  9:57 AM   Specimen: BLOOD  Result Value Ref Range Status   Specimen Description BLOOD BLOOD LEFT HAND  Final   Special  Requests   Final    BOTTLES DRAWN AEROBIC AND ANAEROBIC Blood Culture adequate volume   Culture   Final    NO GROWTH 3 DAYS Performed at [facility], [address], [place], [place] [address]    Report Status PENDING  Incomplete  Culture, blood (Routine X 2) w Reflex to ID Panel     Status: None (Preliminary result)   Collection Time: 07/17/24  9:57 AM   Specimen: BLOOD  Result Value Ref Range Status   Specimen Description BLOOD BLOOD LEFT WRIST  Final   Special Requests   Final    BOTTLES DRAWN AEROBIC AND ANAEROBIC Blood Culture adequate volume   Culture   Final    NO GROWTH 3 DAYS Performed at [facility], [address], [place], [place] [address]    Report Status PENDING  Incomplete    Procedures and diagnostic studies:  DG Chest Port 1 View Result Date: 07/19/2024 CLINICAL DATA:  Dyspnea EXAM: PORTABLE CHEST 1 VIEW COMPARISON:  07/18/2024 FINDINGS: Two frontal views of the chest demonstrate an enteric catheter passing below diaphragm, tip excluded by collimation. Left internal jugular catheter tip overlies superior vena cava unchanged. 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 The cardiac silhouette is stable. Mild central vascular congestion, with a trace right pleural effusion unchanged since prior exam.  The nodular density projecting over the right upper lateral chest on prior study is less well seen on this exam. This may be due to positioning. No pneumothorax. No acute bony abnormalities. IMPRESSION: 1. Pulmonary vascular congestion and small right pleural effusion, unchanged since prior study. 2. The nodular consolidation within the right upper lateral chest seen on prior x-ray and CT is not well seen on this exam, likely due to patient positioning. Electronically Signed   By: Ozell Daring M.D.   On: 07/19/2024 17:34               LOS: 9 days   [name]  [facility]   Pager on [email or web address]. If 7PM-7AM, please contact  night-coverage at [email or web address]     07/20/2024, 12:14 PM

## 2024-07-20 NOTE — Progress Notes (Signed)
 Physical Therapy Treatment Patient Details Name: [name] MRN: [id number] DOB: January 19, 1961 Today's Date: 07/20/2024   History of Present Illness 63 year old male patient admitted to the ICU with severe alcohol  withdrawal Dts requiring multiple doses of Phenobarb and eventually precedex  drip. Unfortunately course complicated by an aspriation event requiring intubation and mechanical ventilation with course c/b peri intubation brief cardiac arrest requiring one round of CPR.  Pt ultimately needing to be extubated ~24 hours.    PT Comments  Pt more awake and better able to interact today.  He showed improved bed mobility and transfers but is still needing some physical assist, heavy cuing and encouragement and overall is quick to fatigue and tolerates only brief bouts of activity before needing to rest and recover.  He was hesitant to try walking, but did manage to take a few very small and labored forward steps along EOB with chair follow.  Pt will benefit from ongoing PT, continue with POC.    If plan is discharge home, recommend the following: Two people to help with walking and/or transfers;A lot of help with bathing/dressing/bathroom;Assistance with cooking/housework;Assist for transportation;Help with stairs or ramp for entrance   Can travel by private vehicle     No  Equipment Recommendations  Rolling walker (2 wheels)    Recommendations for Other Services       Precautions / Restrictions Precautions Precautions: Fall Recall of Precautions/Restrictions: Intact Restrictions Weight Bearing Restrictions Per Provider Order: No     Mobility  Bed Mobility Overal bed mobility: Needs Assistance Bed Mobility: Supine to Sit     Supine to sit: Mod assist, Min assist     General bed mobility comments: Pt with increased U&LE strength and ability to effectively use them to roll and then pull up (with HHA) to EOB w/o excessive assist    Transfers Overall transfer level: Needs  assistance Equipment used: Rolling walker (2 wheels) Transfers: Sit to/from Stand Sit to Stand: Min assist           General transfer comment: heavy and repeated cuing for set up (U&LEs), weight shift, sequencing.  Pt's O2 dropped to mid 80s (on HFNC 35%) and HR stayed in the 100-115 range on both stand efforts.    Ambulation/Gait Ambulation/Gait assistance: Min assist Gait Distance (Feet): 3 Feet Assistive device: Rolling walker (2 wheels)         General Gait Details: Pt was able to take a few turning steps (cues to insure knee extension, appropriate UE use, constant encouragement).  After seated rest break we stood again and he was able to take a few very small (but actual) forward steps, chair following, very quick to fatigue   Stairs             Wheelchair Mobility     Tilt Bed    Modified Rankin (Stroke Patients Only)       Balance Overall balance assessment: Needs assistance Sitting-balance support: Feet supported Sitting balance-Leahy Scale: Good Sitting balance - Comments: Pt better able to tolerate sitting today, still needing UE support but more upright (no rectal tube for discomfort/causing lean)   Standing balance support: No upper extremity supported Standing balance-Leahy Scale: Fair Standing balance comment: much more solid in standing today (heavy cuing for quad engagement and UE use on AD)                            Communication Communication Communication: No apparent difficulties Factors  Affecting Communication: Hearing impaired  Cognition Arousal: Alert Behavior During Therapy: WFL for tasks assessed/performed   PT - Cognitive impairments: No apparent impairments                       PT - Cognition Comments: improved flow of speech, vocal clarity and volume today Following commands: Intact Following commands impaired: Follows one step commands with increased time    Cueing Cueing Techniques: Verbal cues,  Gestural cues, Tactile cues  Exercises Other Exercises Other Exercises: encouraged continued LE isometrics    General Comments General comments (skin integrity, edema, etc.): Pt still very weak and limited, but improved cognition, mobility and general activity tolerance.      Pertinent Vitals/Pain Pain Assessment Pain Assessment: Faces Pain Score: 2  Pain Location: reports less severe rib/chest pain    Home Living                          Prior Function            PT Goals (current goals can now be found in the care plan section) Progress towards PT goals: Progressing toward goals    Frequency    Min 2X/week      PT Plan      Co-evaluation              AM-PAC PT 6 Clicks Mobility   Outcome Measure  Help needed turning from your back to your side while in a flat bed without using bedrails?: A Little Help needed moving from lying on your back to sitting on the side of a flat bed without using bedrails?: A Lot Help needed moving to and from a bed to a chair (including a wheelchair)?: Total Help needed standing up from a chair using your arms (e.g., wheelchair or bedside chair)?: Total Help needed to walk in hospital room?: Total Help needed climbing 3-5 steps with a railing? : Total 6 Click Score: 9    End of Session Equipment Utilized During Treatment: Gait belt Activity Tolerance: Patient tolerated treatment well Patient left: with chair alarm set;with call bell/phone within reach;with nursing/sitter in room Nurse Communication: Mobility status PT Visit Diagnosis: Muscle weakness (generalized) (M62.81);Difficulty in walking, not elsewhere classified (R26.2);Unsteadiness on feet (R26.81)     Time: 8389-8356 PT Time Calculation (min) (ACUTE ONLY): 33 min  Charges:    $Therapeutic Activity: 23-37 mins PT General Charges $$ ACUTE PT VISIT: 1 Visit                     Carmin JONELLE Deed, DPT 07/20/2024, 5:05 PM

## 2024-07-20 NOTE — Plan of Care (Signed)
   Problem: Education: Goal: Knowledge of General Education information will improve Description: Including pain rating scale, medication(s)/side effects and non-pharmacologic comfort measures Outcome: Progressing   Problem: Clinical Measurements: Goal: Ability to maintain clinical measurements within normal limits will improve Outcome: Progressing Goal: Diagnostic test results will improve Outcome: Progressing

## 2024-07-21 DIAGNOSIS — J441 Chronic obstructive pulmonary disease with (acute) exacerbation: Secondary | ICD-10-CM | POA: Diagnosis not present

## 2024-07-21 DIAGNOSIS — R0603 Acute respiratory distress: Secondary | ICD-10-CM | POA: Diagnosis not present

## 2024-07-21 LAB — RENAL FUNCTION PANEL
Albumin: 2.7 g/dL — ABNORMAL LOW (ref 3.5–5.0)
Anion gap: 7 (ref 5–15)
BUN: 33 mg/dL — ABNORMAL HIGH (ref 8–23)
CO2: 33 mmol/L — ABNORMAL HIGH (ref 22–32)
Calcium: 8.4 mg/dL — ABNORMAL LOW (ref 8.9–10.3)
Chloride: 105 mmol/L (ref 98–111)
Creatinine, Ser: 1.15 mg/dL (ref 0.61–1.24)
GFR, Estimated: >60 mL/min (ref >60)
Glucose, Bld: 193 mg/dL — ABNORMAL HIGH (ref 70–99)
Phosphorus: 3.4 mg/dL (ref 2.5–4.6)
Potassium: 4 mmol/L (ref 3.5–5.1)
Sodium: 145 mmol/L (ref 135–145)

## 2024-07-21 LAB — CBC
HCT: 26.9 % — ABNORMAL LOW (ref 39.0–52.0)
Hemoglobin: 8 g/dL — ABNORMAL LOW (ref 13.0–17.0)
MCH: 27.5 pg (ref 26.0–34.0)
MCHC: 29.7 g/dL — ABNORMAL LOW (ref 30.0–36.0)
MCV: 92.4 fL (ref 80.0–100.0)
Platelets: 211 K/uL (ref 150–400)
RBC: 2.91 MIL/uL — ABNORMAL LOW (ref 4.22–5.81)
RDW: 16.8 % — ABNORMAL HIGH (ref 11.5–15.5)
WBC: 10.5 K/uL (ref 4.0–10.5)
nRBC: 0 % (ref 0.0–0.2)

## 2024-07-21 LAB — GLUCOSE, CAPILLARY
Glucose-Capillary: 176 mg/dL — ABNORMAL HIGH (ref 70–99)
Glucose-Capillary: 163 mg/dL — ABNORMAL HIGH (ref 70–99)
Glucose-Capillary: 169 mg/dL — ABNORMAL HIGH (ref 70–99)
Glucose-Capillary: 156 mg/dL — ABNORMAL HIGH (ref 70–99)
Glucose-Capillary: 134 mg/dL — ABNORMAL HIGH (ref 70–99)
Glucose-Capillary: 158 mg/dL — ABNORMAL HIGH (ref 70–99)

## 2024-07-21 LAB — PREPARE RBC (CROSSMATCH)

## 2024-07-21 MED ORDER — FREE WATER
30.0000 mL | Status: DC
  Administered 2024-07-21 – 2024-07-22 (×2): 30 mL

## 2024-07-21 MED ORDER — FUROSEMIDE 10 MG/ML IJ SOLN
20.0000 mg | Freq: Once | INTRAMUSCULAR | Status: AC
  Administered 2024-07-21: 20 mg via INTRAVENOUS
  Filled 2024-07-21: qty 2

## 2024-07-21 MED ORDER — SODIUM CHLORIDE 0.9% IV SOLUTION: Freq: Once | INTRAVENOUS | Status: AC

## 2024-07-21 NOTE — Plan of Care (Signed)
 Discussed with patient plan of care for the evening, pain management and medications with some teach back displayed.  Problem: Education: Goal: Knowledge of General Education information will improve Description: Including pain rating scale, medication(s)/side effects and non-pharmacologic comfort measures Outcome: Progressing   Problem: Health Behavior/Discharge Planning: Goal: Ability to manage health-related needs will improve Outcome: Not Progressing   Problem: Pain Managment: Goal: General experience of comfort will improve and/or be controlled Outcome: Progressing

## 2024-07-21 NOTE — Progress Notes (Addendum)
 [facility] CLINIC CARDIOLOGY PROGRESS NOTE       Patient ID: [name] MRN: [id number] DOB/AGE: [birth year] 63 y.o.  Admit date: 07/11/2024 Referring Physician Dr. Charlie Sellar Primary Physician Osa Geralds, NP  Primary Cardiologist Tinnie Maiden, NP  Reason for Consultation elevated troponins  HPI: [name] is a 63 y.o. 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 Found to have pneumonia, overnight troponins uptrending. Cardiology was consulted for further evaluation.   Interval history: -Patient seen and examined this AM, resting in bed. Patient reports mild chest wall tenderness, worse with palpation. Patient still with SOB, on 4L O2. Patient reports overall he feels a little better.  -Patient BP improving, HR stable -Per tele no more evidence of ventricular ectopy, in SR rate 90s.  Review of systems complete and found to be negative unless listed above    Past Medical History:  Diagnosis Date   COPD (chronic obstructive pulmonary disease) (HCC)    CVA (cerebral infarction)    Headache    mild, since stroke 2012   Hypertension    MI (myocardial infarction) (HCC) 05/09/2012   Reading difficulty    pt reports he reads at about a second grade level   Stroke [facility]) 2012   numbness to left hand    Wears dentures    full upper and lower    Past Surgical History:  Procedure Laterality Date   CAROTID ENDARTERECTOMY Left    2012 or 2013   CHOLECYSTECTOMY N/A 07/26/2016   Procedure: LAPAROSCOPIC CHOLECYSTECTOMY;  Surgeon: Charlie FORBES Fell, MD;  Location: [facility] ORS;  Service: General;  Laterality: N/A;   COLONOSCOPY N/A 05/08/2024    Procedure: COLONOSCOPY;  Surgeon: Jinny Carmine, MD;  Location: [facility] ENDOSCOPY;  Service: Endoscopy;  Laterality: N/A;   COLONOSCOPY WITH PROPOFOL  N/A 02/09/2022   Procedure: COLONOSCOPY WITH PROPOFOL ;  Surgeon: Jinny Carmine, MD;  Location: [facility] ENDOSCOPY;  Service: Endoscopy;  Laterality: N/A;   CORONARY ARTERY BYPASS GRAFT  05/10/2012   3 vessel   CORONARY STENT INTERVENTION N/A 07/02/2024   Procedure: CORONARY STENT INTERVENTION;  Surgeon: Katina Albright, MD;  Location: [facility] INVASIVE CV LAB;  Service: Cardiovascular;  Laterality: N/A;   CORONARY ULTRASOUND/IVUS N/A 07/02/2024   Procedure: Coronary Ultrasound/IVUS;  Surgeon: Katina Albright, MD;  Location: [facility] INVASIVE CV LAB;  Service: Cardiovascular;  Laterality: N/A;   ENTEROSCOPY N/A 05/16/2023   Procedure: ENTEROSCOPY;  Surgeon: Therisa Bi, MD;  Location[facility] ENDOSCOPY;  Service: Gastroenterology;  Laterality: N/A;   ENTEROSCOPY N/A 06/01/2023   Procedure: ENTEROSCOPY;  Surgeon: Unk Corinn Skiff, MD;  Location: [facility] ENDOSCOPY;  Service: Gastroenterology;  Laterality: N/A;   ESOPHAGOGASTRODUODENOSCOPY  05/13/2023   Procedure: ESOPHAGOGASTRODUODENOSCOPY (EGD);  Surgeon: Jinny Carmine, MD;  Location: [facility] ENDOSCOPY;  Service: Endoscopy;;   ESOPHAGOGASTRODUODENOSCOPY (EGD) WITH PROPOFOL  N/A 10/28/2022   Procedure: ESOPHAGOGASTRODUODENOSCOPY (EGD) WITH PROPOFOL ;  Surgeon: Onita Elspeth Sharper, DO;  Location: [facility] ENDOSCOPY;  Service: Gastroenterology;  Laterality: N/A;   GIVENS CAPSULE STUDY  05/13/2023   Procedure: GIVENS CAPSULE STUDY;  Surgeon: Jinny Carmine, MD;  Location: [facility] ENDOSCOPY;  Service: Endoscopy;;   GIVENS CAPSULE STUDY  06/01/2023   Procedure: GIVENS CAPSULE STUDY;  Surgeon: Unk Corinn  Jess, MD;  Location: [facility] ENDOSCOPY;  Service: Gastroenterology;;   LEFT HEART CATH AND CORS/GRAFTS ANGIOGRAPHY N/A 07/02/2024   Procedure: LEFT HEART CATH AND CORS/GRAFTS ANGIOGRAPHY;  Surgeon: Katina Albright, MD;  Location: [facility] INVASIVE CV LAB;   Service: Cardiovascular;  Laterality: N/A;    Medications Prior to Admission  Medication Sig Dispense Refill Last Dose/Taking   albuterol  (VENTOLIN  HFA) 108 (90 Base) MCG/ACT inhaler Inhale 2 puffs into the lungs every 6 (six) hours as needed for wheezing or shortness of breath. 6.7 g 2 07/11/2024 Morning   ascorbic acid  (VITAMIN C ) 500 MG tablet Take 1 tablet (500 mg total) by mouth daily. 30 tablet 2 07/11/2024 Morning   aspirin  EC 81 MG tablet Take 1 tablet (81 mg total) by mouth daily. Swallow whole. 30 tablet 5 07/11/2024 Morning   clopidogrel  (PLAVIX ) 75 MG tablet Take 1 tablet (75 mg total) by mouth daily with breakfast. 30 tablet 11 07/11/2024 Morning   dapagliflozin  propanediol (FARXIGA ) 10 MG TABS tablet Take 1 tablet (10 mg total) by mouth daily. 30 tablet 11 07/11/2024 Morning   folic acid  (FOLVITE ) 1 MG tablet Take 1 tablet (1 mg total) by mouth daily. 30 tablet 2 07/11/2024   iron  polysaccharides (NIFEREX) 150 MG capsule Take 1 capsule (150 mg total) by mouth daily. 30 capsule 2 07/11/2024   isosorbide  mononitrate (IMDUR ) 60 MG 24 hr tablet Take 1 tablet (60 mg total) by mouth daily. 30 tablet 11 07/11/2024   losartan  (COZAAR ) 25 MG tablet Take 0.5 tablets (12.5 mg total) by mouth daily. 30 tablet 11 07/11/2024 Morning   metoprolol  succinate (TOPROL -XL) 25 MG 24 hr tablet Take 0.5 tablets (12.5 mg total) by mouth daily. 30 tablet 11 07/11/2024 Morning   pantoprazole  (PROTONIX ) 40 MG tablet Take 1 tablet (40 mg total) by mouth daily. 30 tablet 2 07/11/2024 Morning   pravastatin  (PRAVACHOL ) 20 MG tablet Take 1 tablet (20 mg total) by mouth at bedtime. 30 tablet 11 07/10/2024 Evening   spironolactone  (ALDACTONE ) 25 MG tablet Take 0.5 tablets (12.5 mg total) by mouth daily. 30 tablet 11 07/11/2024 Morning   torsemide  (DEMADEX ) 10 MG tablet Take 1 tablet (10 mg total) by mouth daily. 30 tablet 11 07/11/2024   Social History   Socioeconomic History   Marital status: Divorced    Spouse name: Not on  file   Number of children: Not on file   Years of education: Not on file   Highest education level: Not on file  Occupational History   Not on file  Tobacco Use   Smoking status: Former    Current packs/day: 0.00    Types: Cigarettes    Quit date: 18    Years since quitting: 32.6   Smokeless tobacco: Current    Types: Snuff   Tobacco comments:    changed to dip  Vaping Use   Vaping status: Never Used  Substance and Sexual Activity   Alcohol  use: Yes    Alcohol /week: 26.0 standard drinks of alcohol     Types: 26 Cans of beer per week   Drug use: No   Sexual activity: Yes    Birth control/protection: None  Other Topics Concern   Not on file  Social History Narrative   Not on file   Social Drivers of Health   Financial Resource Strain: Not on file  Food Insecurity: No Food Insecurity (07/12/2024)   Hunger Vital Sign    Worried About Running Out of Food in the Last Year: Never true  Ran Out of Food in the Last Year: Never true  Transportation Needs: No Transportation Needs (07/12/2024)   PRAPARE - Administrator, Civil Service (Medical): No    Lack of Transportation (Non-Medical): No  Physical Activity: Not on file  Stress: Not on file  Social Connections: Socially Isolated (07/12/2024)   Social Connection and Isolation Panel    Frequency of Communication with Friends and Family: Once a week    Frequency of Social Gatherings with Friends and Family: Once a week    Attends Religious Services: Never    Database administrator or Organizations: No    Attends Banker Meetings: Never    Marital Status: Divorced  Catering manager Violence: Not At Risk (07/12/2024)   Humiliation, Afraid, Rape, and Kick questionnaire    Fear of Current or Ex-Partner: No    Emotionally Abused: No    Physically Abused: No    Sexually Abused: No    Family History  Problem Relation Age of Onset   Pneumonia Mother      Vitals:   07/21/24 0200 07/21/24 0400 07/21/24  0500 07/21/24 0600  BP: (!) 116/55 111/63  133/69  Pulse: 95 93  96  Resp: 15 16  (!) 21  Temp:  97.9 F (36.6 C)    TempSrc:  Oral    SpO2: 98% 100%  97%  Weight:   98.6 kg   Height:        PHYSICAL EXAM General: Ill appearing male, no acute distress. HEENT: Normocephalic and atraumatic. Neck: No JVD.  Lungs: Bilateral rhonchi. Heart: HRRR, Normal S1 and S2 without gallops. +systolic ejection murmur.   Abdomen: Non-distended appearing.  Msk: Normal strength and tone for age. Extremities: Warm and well perfused. No clubbing, cyanosis, edema.   Labs: Basic Metabolic Panel: Recent Labs    07/19/24 0559 07/20/24 0531 07/21/24 0455  NA 145 147* 145  K 3.7 4.4 4.0  CL 108 109 105  CO2 32 29 33*  GLUCOSE 167* 178* 193*  BUN 23 27* 33*  CREATININE 0.85 1.01 1.15  CALCIUM  8.0* 8.6* 8.4*  MG 2.4  --   --   PHOS 3.5 3.1 3.4   Liver Function Tests: Recent Labs    07/20/24 0531 07/21/24 0455  ALBUMIN 3.0* 2.7*   No results for input(s): LIPASE, AMYLASE in the last 72 hours. CBC: Recent Labs    07/20/24 0531 07/21/24 0455  WBC 12.8* 10.5  HGB 9.2* 8.0*  HCT 31.6* 26.9*  MCV 92.9 92.4  PLT 229 211   Cardiac Enzymes: No results for input(s): CKTOTAL, CKMB, CKMBINDEX, TROPONINIHS in the last 72 hours.   BNP: No results for input(s): BNP in the last 72 hours.  D-Dimer: No results for input(s): DDIMER in the last 72 hours. Hemoglobin A1C: No results for input(s): HGBA1C in the last 72 hours. Fasting Lipid Panel: No results for input(s): CHOL, HDL, LDLCALC, TRIG, CHOLHDL, LDLDIRECT in the last 72 hours.  Thyroid Function Tests: No results for input(s): TSH, T4TOTAL, T3FREE, THYROIDAB in the last 72 hours.  Invalid input(s): FREET3 Anemia Panel: No results for input(s): VITAMINB12, FOLATE, FERRITIN, TIBC, IRON , RETICCTPCT in the last 72 hours.   Radiology: [facility] Chest Port 1 View Result Date:  07/19/2024 CLINICAL DATA:  Dyspnea EXAM: PORTABLE CHEST 1 VIEW COMPARISON:  07/18/2024 FINDINGS: Two frontal views of the chest demonstrate an enteric catheter passing below diaphragm, tip excluded by collimation. Left internal jugular catheter tip overlies superior vena cava unchanged.  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 The cardiac silhouette is stable. Mild central vascular congestion, with a trace right pleural effusion unchanged since prior exam. The nodular density projecting over the right upper lateral chest on prior study is less well seen on this exam. This may be due to positioning. No pneumothorax. No acute bony abnormalities. IMPRESSION: 1. Pulmonary vascular congestion and small right pleural effusion, unchanged since prior study. 2. The nodular consolidation within the right upper lateral chest seen on prior x-ray and CT is not well seen on this exam, likely due to patient positioning. Electronically Signed   By: Ozell Daring M.D.   On: 07/19/2024 17:34   DG Chest Port 1 View Result Date: 07/18/2024 EXAM: 1 VIEW XRAY OF THE CHEST 07/18/2024 04:59:06 AM COMPARISON: 07/16/2024 CLINICAL HISTORY: 427266 Acute respiratory failure with hypoxia (HCC) 427266. Acute respiratory failure with hypoxia FINDINGS: LUNGS AND PLEURA: Nodular opacity within the periphery of the right upper lobe appears similar to CT from 07/11/2024. Please refer to that report for follow-up recommendations. Mild asymmetric hazy opacification over the right mid and right lower lung may be related to positional artifact versus small posterior layering effusion. Mild subsegmental atelectasis in the left base. HEART AND MEDIASTINUM: Stable cardiomediastinal contours. Previous median sternotomy and CABG procedure. BONES AND SOFT TISSUES: Aortic atherosclerotic calcification. Left IJ catheter is identified with the tip in the projection of the SVC. There is an enteric tube which courses below the field of view. ET tube tip is stable  above the carina. IMPRESSION: 1. Stable nodular opacity within the periphery of the right upper lobe, similar to CT from 07/11/2024. Please refer to that report for follow-up recommendations. 2. Mild asymmetric hazy opacification over the right mid and right lower lung, possibly related to rotational artifact versus small posterior layering effusion. 3. Mild subsegmental atelectasis in the left base. Electronically signed by: Waddell Calk MD 07/18/2024 06:51 AM EDT RP Workstation: [id number]   DG Chest Port 1 View Result Date: 07/16/2024 CLINICAL DATA:  Endotracheal tube and central line placement. EXAM: PORTABLE CHEST 1 VIEW COMPARISON:  07/16/2024 at 8:58 a.m. FINDINGS: Patient is rotated to the right. Endotracheal tube has tip proximally 13.5 cm above the carina. This could be advanced approximately 6 cm. Left IJ central venous catheter has tip over the SVC. Enteric tube courses into the region of the stomach and off the image as tip is not visualized. Lungs are adequately inflated with possible mild hazy opacification over the right upper lung abutting the minor fissure which may be due to atelectasis or infection. Left lung is clear. Cardiomediastinal silhouette and remainder of the exam is unchanged. IMPRESSION: 1. Endotracheal tube has tip proximally 13.5 cm above the carina. This could be advanced approximately 6 cm. 2. Left IJ central venous catheter with tip over the SVC. 3. Possible mild hazy opacification over the right upper lung abutting the minor fissure which may be due to atelectasis or infection. Electronically Signed   By: Toribio Agreste M.D.   On: 07/16/2024 10:53   DG Chest Port 1 View Result Date: 07/16/2024 CLINICAL DATA:  [id number] Aspiration into airway [id number]. EXAM: PORTABLE CHEST 1 VIEW COMPARISON:  06/23/2024. FINDINGS: Bilateral lungs appear hyperlucent with coarse bronchovascular markings, in keeping with COPD. Bilateral lungs otherwise appear clear. No dense consolidation or lung  collapse. Bilateral costophrenic angles are clear. Stable cardio-mediastinal silhouette. There are surgical staples along the heart border and sternotomy wires, status post CABG (coronary artery bypass graft). No acute osseous abnormalities. The  soft tissues are within normal limits. Enteric tube is seen coursing below the left hemidiaphragm however, the tip is not included on the film. IMPRESSION: No active disease. COPD. Electronically Signed   By: Ree Molt M.D.   On: 07/16/2024 09:09   DG Abd 1 View Result Date: 07/16/2024 CLINICAL DATA:  Vomiting.  NG tube placement. EXAM: ABDOMEN - 1 VIEW COMPARISON:  07/15/2024 FINDINGS: Nasogastric tube has tip and side-port over the stomach in the upper abdomen just left of midline. Visualized lower thorax unchanged. Abdominopelvic images demonstrate a non obstructive bowel gas pattern. Mild fecal retention throughout the colon. Surgical clips over the right upper quadrant. There are degenerative changes of the spine and hips. Calcified plaque over the abdominal aorta and iliac arteries. IMPRESSION: 1. Nasogastric tube with tip and side-port over the stomach. 2. Nonobstructive bowel gas pattern. Mild fecal retention. 3. Aortic atherosclerosis. Electronically Signed   By: Toribio Agreste M.D.   On: 07/16/2024 07:51   DG Abd 1 View Result Date: 07/15/2024 CLINICAL DATA:  Orogastric tube placement. EXAM: ABDOMEN - 1 VIEW COMPARISON:  June 29, 2023. FINDINGS: Distal tip of nasogastric tube is seen in expected position of proximal stomach. IMPRESSION: Nasogastric tube tip seen in expected position of proximal stomach. Electronically Signed   By: Lynwood Landy Raddle M.D.   On: 07/15/2024 10:25   DG Chest Port 1 View Result Date: 07/13/2024 CLINICAL DATA:  Respiratory failure with hypoxia. EXAM: PORTABLE CHEST 1 VIEW COMPARISON:  Multiple previous chest x-rays and recent chest CT. FINDINGS: Stable surgical changes from bypass surgery. The cardiac silhouette, mediastinal  and hilar contours are within normal limits and stable. Underlying emphysematous changes and pulmonary scarring. No pleural effusions or discrete pulmonary infiltrates. No pneumothorax. IMPRESSION: Emphysematous changes and pulmonary scarring but no acute overlying pulmonary process. Electronically Signed   By: MYRTIS Stammer M.D.   On: 07/13/2024 16:08   US  Abdomen Limited RUQ (LIVER/GB) Result Date: 07/13/2024 CLINICAL DATA:  Portal hypertension. EXAM: ULTRASOUND ABDOMEN LIMITED RIGHT UPPER QUADRANT COMPARISON:  Abdominal ultrasound 10/19/2022 FINDINGS: Gallbladder: Surgically absent. Common bile duct: Diameter: 3.3 mm Liver: No focal lesion identified. Parenchymal echogenicity is increased and diffusely heterogeneous. There is lobular liver contour. Portal vein is patent on color Doppler imaging with normal direction of blood flow towards the liver. Other: None. IMPRESSION: Lobular liver contour with increased echogenicity and heterogeneous parenchyma consistent with cirrhosis. No focal liver lesion identified. Electronically Signed   By: Greig Pique M.D.   On: 07/13/2024 16:03   DG Chest Port 1 View Result Date: 07/12/2024 CLINICAL DATA:  Shortness of breath. EXAM: PORTABLE CHEST 1 VIEW COMPARISON:  07/11/2024 FINDINGS: The cardio pericardial silhouette is enlarged. There is pulmonary vascular congestion without overt pulmonary edema. Trace bilateral pleural effusions. Old posterior left sixth rib fracture. Telemetry leads overlie the chest. IMPRESSION: Enlargement of the cardiopericardial silhouette with new pulmonary vascular congestion and trace bilateral pleural effusions. Electronically Signed   By: Camellia Candle M.D.   On: 07/12/2024 12:11   CT Angio Chest PE W/Cm &/Or Wo Cm Result Date: 07/11/2024 CLINICAL DATA:  High probability for PE. Chest pain with shortness of breath EXAM: CT ANGIOGRAPHY CHEST WITH CONTRAST TECHNIQUE: Multidetector CT imaging of the chest was performed using the standard  protocol during bolus administration of intravenous contrast. Multiplanar CT image reconstructions and MIPs were obtained to evaluate the vascular anatomy. RADIATION DOSE REDUCTION: This exam was performed according to the departmental dose-optimization program which includes automated exposure control, adjustment of  the mA and/or kV according to patient size and/or use of iterative reconstruction technique. CONTRAST:  75mL OMNIPAQUE  IOHEXOL  350 MG/ML SOLN COMPARISON:  CT angiogram chest 05/09/2024 FINDINGS: Cardiovascular: Satisfactory opacification of the pulmonary arteries to the segmental level. No evidence of pulmonary embolism. Patient is status post cardiac surgery. The heart is enlarged. There is no pericardial effusion. There are atherosclerotic calcifications of the aorta and coronary arteries. Aorta is normal in size. Mediastinum/Nodes: There are enlarged right hilar lymph nodes measuring up to 11 mm. Enlarged paratracheal lymph nodes measure up to 10 mm. Visualized thyroid gland and esophagus are within normal limits. Lungs/Pleura: Moderate emphysema again seen. There is stable scarring in the right lung apex. There is new patchy slightly nodular airspace disease in the right upper lobe measuring 2.0 by 1.1 cm. There is right-sided central peribronchial wall thickening. Previously identified airspace disease in the left lower lobe has resolved. There is no pleural effusion or pneumothorax. There is a stable left lower lobe pulmonary nodule image 6/57. Upper Abdomen: No acute abnormality. Musculoskeletal: No chest wall abnormality. No acute or significant osseous findings. Review of the MIP images confirms the above findings. IMPRESSION: 1. No evidence for pulmonary embolism. 2. New patchy slightly nodular airspace disease in the right upper lobe worrisome for pneumonia. Follow-up CT recommended in 4-6 weeks recommended to ensure resolution and to exclude underlying nodule. 3. Right-sided central  peribronchial wall thickening worrisome for bronchitis. 4. Right hilar and mediastinal lymphadenopathy, likely reactive. 5. Stable left lower lobe pulmonary nodule. 6. Cardiomegaly. Aortic Atherosclerosis (ICD10-I70.0) and Emphysema (ICD10-J43.9). Electronically Signed   By: Greig Pique M.D.   On: 07/11/2024 18:54   DG Chest Portable 1 View Result Date: 07/11/2024 CLINICAL DATA:  SOB EXAM: PORTABLE CHEST - 1 VIEW COMPARISON:  June 24, 2024 FINDINGS: Biapical pleural thickening. Sternotomy wires and CABG markers. No focal airspace consolidation, pleural effusion, or pneumothorax. Mild cardiomegaly. Tortuous aorta with aortic atherosclerosis. No acute fracture or destructive lesions. Multilevel thoracic osteophytosis. IMPRESSION: No acute cardiopulmonary abnormality. Electronically Signed   By: Rogelia Myers M.D.   On: 07/11/2024 17:25   CARDIAC CATHETERIZATION Result Date: 07/02/2024   Prox LAD to Mid LAD lesion is 90% stenosed.   Ost LM to Mid LM lesion is 50% stenosed.   Ost Cx to Prox Cx lesion is 90% stenosed.   Ost RCA to Prox RCA lesion is 100% stenosed.   Mid Graft to Dist Graft lesion is 95% stenosed.   Recommend uninterrupted dual antiplatelet therapy with Aspirin  81mg  daily and Clopidogrel  75mg  daily for a minimum of 6 months (stable ischemic heart disease-Class I recommendation). 1.  Severe native three-vessel CAD 2.  LIMA to LAD and SVG to OM widely patent 3.  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  Successful direct stenting of the distal SVG to PDA with distal protection with a 4.0 x 15 mm drug-eluting stent with intravascular ultrasound guidance 5.  Aspirin  and clopidogrel  for at least 6 months followed by clopidogrel  indefinitely 6.  Continue workup for aortic valve replacement   DG Chest 2 View Result Date: 06/24/2024 CLINICAL DATA:  sob EXAM: CHEST - 2 VIEW COMPARISON:  June 03, 2024 FINDINGS: Biapical pleural thickening. No focal airspace consolidation, pleural effusion,  or pneumothorax. No cardiomegaly. Sternotomy wires and CABG markers. Tortuous aorta with aortic atherosclerosis. No acute fracture or destructive lesions. Multilevel thoracic osteophytosis. Osteopenia. IMPRESSION: No acute cardiopulmonary abnormality. Electronically Signed   By: Rogelia Myers HERO.D.  On: 06/24/2024 11:54    ECHO 04/2024: 1. Left ventricular ejection fraction, by estimation, is 60 to 65%. The left ventricle has normal function. The left ventricle has no regional wall motion abnormalities. The left ventricular internal cavity size was mildly dilated. Left ventricular diastolic parameters are consistent with Grade I diastolic dysfunction (impaired relaxation).   2. Right ventricular systolic function is normal. The right ventricular  size is normal.   3. The mitral valve is normal in structure. No evidence of mitral valve  regurgitation.   4. The aortic valve is normal in structure. Aortic valve regurgitation is  trivial. Severe aortic valve stenosis.   TELEMETRY reviewed by me 07/21/2024: sinus rhythm, rate 90s  EKG reviewed by me: sinus tachycardia ST depression, rate 129 bpm  Data reviewed by me 07/21/2024: last 24h vitals tele labs imaging I/O hospitalist progress note, PCCM notes  Principal Problem:   Acute respiratory distress Active Problems:   Hyperlipidemia   Cognitive impairment   Alcohol  abuse   Essential hypertension   CAD (coronary artery disease)   Stroke (HCC)   Iron  deficiency anemia   Acute on chronic diastolic CHF (congestive heart failure) (HCC)   COPD exacerbation (HCC)   Elevated lactic acid level   Lobar pneumonia (HCC)   Metabolic acidosis, increased anion gap   Myocardial injury    ASSESSMENT AND PLAN:  [name] is a 63 y.o. 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 Found to have pneumonia, overnight troponins uptrending. Cardiology was consulted for further evaluation.   # Ventricular Ectopy Per tele patient with bigeminy PVCs, started on IV amio on 07/30 for ventricular ectopy. PVC much improved s/p IV amio. Per tele this AM SR with rate 90s with no more ventricular ectopy -Continue to monitor telemetry closely.  -Monitor and replenish electrolytes for a goal K >4, Mag >2  -Continue Amio 400 mg BID for 10 days, then 200 mg daily.   # Acute hypoxic respiratory failure, mechanical ventilation # Aspiration pneumonia # Alcohol  withdrawal, improving # Acute on chronic HFpEF (flash pulmonary edema) # Severe aortic stenosis Presented with SOB, cough, imaging concerning for HCAP/COPD exacerbation. LA elevated on presentation at 3.6 > 5.9> 3.4. s/p extubation 08/01, weaned to 4L [place].  -Ordered 1x IV lasix  20 mg.  -Will plan to resume home torsemide  10 mg daily when alkalosis improves.  -Will resume home dapagliflozin  10 mg daily when able to take PO medication. -Will plan to optimize GDMT as BP and renal function allows and when able to take PO.  (Home meds: losartan , metoprolol  succinate, spironolactone ) -Further management of respiratory failure/ aspiration PNA as per primary team.  -CIWA protocol given hx of alcohol  abuse. -Scheduled to see Dr. Katina 7/30 to discuss aortic valve repair/replacement options. Will reschedule this appointment when patient stabilizes.    # NSTEMI # Coronary artery disease s/p CABG x3 (04/2012) # Hypertension # Hyperlipidemia With recent LHC, s/p DES to SVG to PDA 07/02/2024. Reported some CP when he first came to hospital which has now resolved, no recurrence. Troponin trended 44 > 144 > 205 > 980 > 1862 > 5766 > 5756. Patient weaned off levo on  07/31, BP improving and  stabilizing. -Continue home plavix  75 mg daily. Continue aspirin  81 mg daily. Continue DAPT due to recent stent placement on 07/16. -Started on heparin  gtt by [facility] 7/26, Low Hgb. Completed 24 hours of heparin .  -Continue Crestor  20 mg daily for high-intensity statin management.  -Will resume home metoprolol  succinate when able to tolerate PO. -Home losartan , Imdur  held due to BP borderline. Will consider resuming as BP continues to improve and able to tolerate PO. -Recommend MAP > 65.   # Acute on chronic anemia # Hx AVM Patient received blood transfusion on 07/28 and 07/31.  Hgb 8.7 > 9.2 > 8.0 -Closely monitor H&H. -Continue PPIs. -Goal Hgb >8 due to cardiac history. Recommend transfusion. 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 -Management per primary team.   Did the patient have an acute coronary syndrome (MI, NSTEMI, STEMI, etc) this admission?:  Yes                               AHA/ACC ACS Clinical Performance & Quality Measures: Aspirin  prescribed? - Yes ADP Receptor Inhibitor (Plavix /Clopidogrel , Brilinta/Ticagrelor or Effient/Prasugrel) prescribed (includes medically managed patients)? - Yes Beta Blocker prescribed? - Yes High Intensity Statin (Lipitor 40-80mg  or Crestor  20-40mg ) prescribed? - Yes EF assessed during THIS hospitalization? - No - patient with recent echo performed on 05/10/2024.  For EF <40%, was ACEI/ARB prescribed? - Not Applicable (EF >/= 40%) For EF <40%, Aldosterone Antagonist (Spironolactone  or Eplerenone) prescribed? - Not Applicable (EF >/= 40%) Cardiac Rehab Phase II ordered (including medically managed patients)? - Yes, Patient with recent outpatient LHC performed on 07/16, referred to cardiac rehab during that time on 07/04/24.     This patient's plan of care was discussed and created with Dr. Florencio and he is in agreement.  Signed: Dorene Comfort, PA-C   07/21/2024, 8:54 AM [facility]

## 2024-07-21 NOTE — Plan of Care (Signed)
  Problem: Education: Goal: Knowledge of General Education information will improve Description: Including pain rating scale, medication(s)/side effects and non-pharmacologic comfort measures Outcome: Progressing   Problem: Health Behavior/Discharge Planning: Goal: Ability to manage health-related needs will improve Outcome: Progressing   Problem: Clinical Measurements: Goal: Ability to maintain clinical measurements within normal limits will improve Outcome: Progressing Goal: Will remain free from infection Outcome: Progressing Goal: Diagnostic test results will improve Outcome: Progressing Goal: Respiratory complications will improve Outcome: Progressing Goal: Cardiovascular complication will be avoided Outcome: Progressing   Problem: Elimination: Goal: Will not experience complications related to bowel motility Outcome: Progressing Goal: Will not experience complications related to urinary retention Outcome: Progressing   Problem: Coping: Goal: Level of anxiety will decrease Outcome: Progressing   Problem: Skin Integrity: Goal: Risk for impaired skin integrity will decrease Outcome: Progressing   Problem: Safety: Goal: Ability to remain free from injury will improve Outcome: Progressing   Problem: Respiratory: Goal: Ability to maintain adequate ventilation will improve Outcome: Progressing Goal: Ability to maintain a clear airway will improve Outcome: Progressing   Problem: Clinical Measurements: Goal: Ability to maintain a body temperature in the normal range will improve Outcome: Progressing   Problem: Role Relationship: Goal: Method of communication will improve Outcome: Progressing

## 2024-07-21 NOTE — Progress Notes (Signed)
 Occupational Therapy Treatment Patient Details Name: [name] MRN: [id number] DOB: [birth year] Today's Date: 07/21/2024   History of present illness 63 year old male patient admitted to the ICU with severe alcohol  withdrawal Dts requiring multiple doses of Phenobarb and eventually precedex  drip. Unfortunately course complicated by an aspriation event requiring intubation and mechanical ventilation with course c/b peri intubation brief cardiac arrest requiring one round of CPR.  Pt ultimately needing to be extubated ~24 hours.   OT comments  Mr Byas was seen for OT treatment on this date. Upon arrival to room pt in bed, agreeable to tx and requesting to toilet. Pt requires MIN A exit bed. MIN A + HHA bed>BSC step pivot t/f, MIN A + RW BSC>chair ~4 ft t/f. MAX A + RW pericare in standing. SpO2 88% on 4L [place] with activity, mid 90s at rest. Educated on IS frequency and use. Pt making good progress toward goals, will continue to follow POC. Discharge recommendation remains appropriate.       If plan is discharge home, recommend the following:  A lot of help with walking and/or transfers;A lot of help with bathing/dressing/bathroom;Assistance with cooking/housework;Direct supervision/assist for medications management;Assist for transportation   Equipment Recommendations  None recommended by OT    Recommendations for Other Services      Precautions / Restrictions Precautions Precautions: Fall Recall of Precautions/Restrictions: Intact Restrictions Weight Bearing Restrictions Per Provider Order: No       Mobility Bed Mobility Overal bed mobility: Needs Assistance Bed Mobility: Supine to Sit     Supine to sit: Min assist          Transfers Overall transfer level: Needs assistance Equipment used: Rolling walker (2 wheels) Transfers: Sit to/from Stand Sit to Stand: Min assist                 Balance Overall balance assessment: Needs assistance Sitting-balance support: Feet  supported Sitting balance-Leahy Scale: Fair     Standing balance support: Bilateral upper extremity supported Standing balance-Leahy Scale: Fair                             ADL either performed or assessed with clinical judgement   ADL Overall ADL's : Needs assistance/impaired                                       General ADL Comments: MAX A + RW pericare in standing. MIN A + RW for BSC t/f, cues for technique/safety    Extremity/Trunk Assessment              Vision       Perception     Praxis     Communication Communication Communication: No apparent difficulties   Cognition Arousal: Alert Behavior During Therapy: WFL for tasks assessed/performed Cognition: Cognition impaired             OT - Cognition Comments: oriented to self and location                 Following commands: Intact Following commands impaired: Follows one step commands with increased time      Cueing   Cueing Techniques: Verbal cues, Gestural cues, Tactile cues  Exercises      Shoulder Instructions       General Comments      Pertinent Vitals/ Pain  Pain Assessment Pain Assessment: No/denies pain  Home Living                                          Prior Functioning/Environment              Frequency  Min 2X/week        Progress Toward Goals  OT Goals(current goals can now be found in the care plan section)  Progress towards OT goals: Progressing toward goals  Acute Rehab OT Goals OT Goal Formulation: With patient Time For Goal Achievement: 08/02/24 Potential to Achieve Goals: Good ADL Goals Pt Will Perform Lower Body Bathing: with min assist Pt Will Perform Lower Body Dressing: with min assist Pt Will Transfer to Toilet: with min assist  Plan      Co-evaluation                 AM-PAC OT 6 Clicks Daily Activity     Outcome Measure   Help from another person eating meals?:  None Help from another person taking care of personal grooming?: A Little Help from another person toileting, which includes using toliet, bedpan, or urinal?: A Lot Help from another person bathing (including washing, rinsing, drying)?: A Lot Help from another person to put on and taking off regular upper body clothing?: A Little Help from another person to put on and taking off regular lower body clothing?: A Lot 6 Click Score: 16    End of Session Equipment Utilized During Treatment: Oxygen  OT Visit Diagnosis: Muscle weakness (generalized) (M62.81);History of falling (Z91.81)   Activity Tolerance Patient tolerated treatment well   Patient Left in chair;with call bell/phone within reach   Nurse Communication Mobility status        Time: 9092-9058 OT Time Calculation (min): 34 min  Charges: OT General Charges $OT Visit: 1 Visit OT Treatments $Self Care/Home Management : 23-37 mins  Elston Slot, M.S. OTR/L  07/21/24, 11:01 AM  ascom [phone]

## 2024-07-21 NOTE — Progress Notes (Signed)
 Progress Note    [name]  FMW:[id number] DOB: [birth year]  DOA: 07/11/2024 PCP: Osa Geralds, NP      Brief Narrative:    Medical records reviewed and are as summarized below:  [name] is a 63 y.o. male with medical history significant of  GIB, alcohol  abuse, COPD on 2 L oxygen, hypertension, CAD with prior CABG and recent Stent placement,, severe aortic stenosis, HLD, stroke, dementia, chronic diastolic CHF, who presented to the hospital on 07/11/2024 with SOB, cough productive of yellowish sputum, sided chest pain.   Patient was recently hospitalized from 7/16 - 7/17 for elective LHC. Pt was s/p of successful direct stenting of distal SVG to PDA.  CTA negative for PE, showed patchy infiltration in the right upper lobe.  Patient is admitted to telemetry bed as inpatient.    He was admitted to the hospitalist service for COPD exacerbation and acute hypoxic respiratory failure.  He developed worsening respiratory distress shortly after admission.  He was placed on BiPAP and was transferred to the ICU for further management.  He has a became agitated and this was attributed to acute alcohol  withdrawal syndrome.  He was started on IV Precedex  infusion for this. He developed acute toxic metabolic encephalopathy and worsening respiratory failure likely from aspiration pneumonia.  He required intubation and mechanical ventilation in the ICU.  He became hypotensive with induction meds despite IV fluids and IV Levophed  infusion.  There was brief loss of pulse but ROSC was achieved after 2 minutes of CPR and 1 dose of epinephrine .    Significant Hospital Events: Including procedures, antibiotic start and stop dates in addition to other pertinent events   7/25: admit with respiratory failure 7/26: clinically decompensated, worsening respiratory status initiated on BiPAP 7/27: on nasal cannula, reports anxiety and tremors consistent with EtOH withdrawal.  Received a total of 15  mg/kg of phenobarbital , however he remained agitated requiring low dose precedex  gtt.  7/28: Pt resting in bed no agitation on precedex  gtt @0 .5 mcg/min.  Orders placed to transfuse 1 unit of pRBC's hgb 7.2 goal hgb 8.0.  No signs of active bleeding  7/29: Pt remains encephalopathic with severe DT's requiring precedex  gtt.  NGT placed to initiate tube feeds and administer medications  7/30: Earlier this morning with vomiting, concern for aspiration event as now with increased WOB and increasing FiO2 requirements (up to 4L). Remains encephalopathic on Precedex .  Respiratory status continued to decline, progressed to agonal respirations, Required INTUBATION for airway protection.  Hypotensive with induction meds despite IV fluids and Levophed  resulting in brief loss of pulse (ROSC obtained after 2 minutes of CPR and 1 epi).  Central line placed due to vasopressors. 7/31: Overnight with episode on non-sustained SVT which converted spontaneously, remains on Amiodarone  gtt per Cardiology.  Nursing reported black tarry stools overnight, Hgb remains stable.   Levophed  weaned off, on minimal vent settings, will perform WUA and SBT as mental status allows.  With fever of 101 F overnight and WBC nearly doubled, tracheal aspirate results pending, will check blood cultures and UA, broaden to empiric Vancomycin  and Zosyn  pending culture results.   08/01:  No significant events reported overnight. Received 1 unit pRBC's overnight for Hgb 7.5 with appropriate response to 8.8 and has since remained stable with no reports of bleeding from nursing.  Fevers resolved, WBC improved with broadening to Zosyn  yesterday, cultures currently with no growth.  Weaned off levophed .  On minimal vent support, passed WUA and SBT,  EXTUBATED TO HHFNC.  Diuresed with 20 mg IV Lasix  x1 dose with extubation, needs aggressive pulmonary toilet.      Assessment/Plan:   Principal Problem:   Acute respiratory distress Active Problems:   COPD  exacerbation (HCC)   Lobar pneumonia (HCC)   Elevated lactic acid level   Metabolic acidosis, increased anion gap   Essential hypertension   Acute on chronic diastolic CHF (congestive heart failure) (HCC)   Hyperlipidemia   Stroke (HCC)   CAD (coronary artery disease)   Iron  deficiency anemia   Alcohol  abuse   Cognitive impairment   Myocardial injury   Nutrition Problem: Inadequate oral intake Etiology: lethargy/confusion  Signs/Symptoms: NPO status   Body mass index is 29.48 kg/m.  (Obesity)    Acute on chronic hypoxic respiratory failure: Oxygen has been weaned down to 4 L/min via HFNC.  He is no longer on oxygen via HHFNC. Taper down oxygen as able.   S/p extubation on 07/18/2024.  Was intubated on 07/16/2024.   Aspiration pneumonia: Completed IV Zosyn  on 07/21/2024. Leukocytosis: Improved Hypotension: Resolved Suspected septic shock: Resolved.  He is off of IV Levophed  infusion.   Nebulized Mucomyst  has been discontinued   COPD exacerbation: Continue bronchodilators.  Completed steroids.   Acute toxic metabolic encephalopathy: Mental status is improving.  Continue supportive care Alcohol  use disorder with alcohol  withdrawal syndrome: He is off of IV Precedex  infusion.  Continue IV thiamine .   Brief cardiac arrest due to severe hypotension from induction meds on 07/16/2024 (ROSC achieved after 2 minutes of CPR and 1 dose of epinephrine ).    Acute NSTEMI, elevated troponins (peaked at 5,766), CAD, recent elective left heart cath s/p DES to SVG to PDA 07/02/2024,  history of CABG x 3 (May 2013): Continue aspirin , Plavix  and rosuvastatin .   Acute on chronic diastolic CHF, severe aortic stenosis: S/p treatment with IV Lasix . 2D echo on 05/10/2024 showed EF estimated at 60 to 65%, grade 1 diastolic dysfunction, severe aortic valve stenosis.   Chronic anemia, history of GI bleed: Hemoglobin down to 8.  Patient will be transfused 1 unit of PRBCs given underlying cardiac  disease. No evidence of overt GI bleeding thus far.  Continue IV Protonix .  H&H stable. He was evaluated by gastroenterologist on 07/13/2024.  GI signed off. 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 recommended by speech therapist for now.  Continue enteral nutrition via NG tube Plan for MBBS on 07/22/2024   Hypernatremia: Improved.  Continue free water  via NG tube.  Continue enteral nutrition.  Monitor BMP. AKI: Resolved    Hyperglycemia: Glucose levels are stable.  Hemoglobin A1c was 5.3 on 10/28/2022   Comorbidities include hypertension, hyperlipidemia, history of stroke   Transfer from stepdown unit to progressive care unit.  Diet Order             Diet NPO time specified  Diet effective now                            Consultants: Medical illustrator Cardiologist  Procedures: Intubated on 07/16/2024 and extubated on 07/18/2024 Left IJ central line placed on 07/16/2024    Medications:    amiodarone   400 mg Per Tube BID   Followed by   NOREEN ON 07/28/2024] amiodarone   200 mg Per Tube Daily   ascorbic acid   500 mg Per Tube Daily   aspirin   81 mg  Per Tube Daily   Chlorhexidine  Gluconate Cloth  6 each Topical Q0600   cholecalciferol   2,000 Units Per Tube Daily   clopidogrel   75 mg Per Tube Daily   docusate  100 mg Per Tube BID   enoxaparin  (LOVENOX ) injection  40 mg Subcutaneous QHS   folic acid   1 mg Per Tube Daily   free water   30 mL Per Tube Q4H   furosemide   20 mg Intravenous Once   ipratropium-albuterol   3 mL Nebulization Q6H   iron  polysaccharides  150 mg Per Tube Daily   multivitamin with minerals  1 tablet Per Tube Daily   mouth rinse  15 mL Mouth Rinse 4 times per day   pantoprazole  (PROTONIX ) IV  40 mg Intravenous Q12H   polyethylene glycol  17 g Per Tube Daily   rosuvastatin   20 mg Per Tube Daily   thiamine  (VITAMIN B1) injection  100 mg  Intravenous Daily   Continuous Infusions:  feeding supplement (PIVOT 1.5 CAL) 60 mL/hr at 07/21/24 0000     Anti-infectives (From admission, onward)    Start     Dose/Rate Route Frequency Ordered Stop   07/17/24 1000  piperacillin -tazobactam (ZOSYN ) IVPB 3.375 g  Status:  Discontinued        3.375 g 12.5 mL/hr over 240 Minutes Intravenous Every 8 hours 07/17/24 0811 07/21/24 1100   07/17/24 1000  vancomycin  (VANCOREADY) IVPB 2000 mg/400 mL  Status:  Discontinued        2,000 mg 200 mL/hr over 120 Minutes Intravenous  Once 07/17/24 0811 07/17/24 1026   07/14/24 1200  cefTRIAXone  (ROCEPHIN ) 2 g in sodium chloride  0.9 % 100 mL IVPB        2 g 200 mL/hr over 30 Minutes Intravenous Every 24 hours 07/14/24 1030 07/16/24 1204   07/12/24 1600  piperacillin -tazobactam (ZOSYN ) IVPB 3.375 g  Status:  Discontinued        3.375 g 12.5 mL/hr over 240 Minutes Intravenous Every 8 hours 07/12/24 1503 07/14/24 1030   07/12/24 0900  vancomycin  (VANCOREADY) IVPB 1250 mg/250 mL  Status:  Discontinued        1,250 mg 166.7 mL/hr over 90 Minutes Intravenous Every 12 hours 07/11/24 2012 07/12/24 1458   07/11/24 2100  ceFEPIme  (MAXIPIME ) 2 g in sodium chloride  0.9 % 100 mL IVPB  Status:  Discontinued        2 g 200 mL/hr over 30 Minutes Intravenous Every 8 hours 07/11/24 1959 07/12/24 1458   07/11/24 2100  vancomycin  (VANCOREADY) IVPB 2000 mg/400 mL        2,000 mg 200 mL/hr over 120 Minutes Intravenous  Once 07/11/24 2004 07/11/24 2255   07/11/24 1945  cefTRIAXone  (ROCEPHIN ) 1 g in sodium chloride  0.9 % 100 mL IVPB  Status:  Discontinued        1 g 200 mL/hr over 30 Minutes Intravenous  Once 07/11/24 1930 07/11/24 1933   07/11/24 1945  azithromycin  (ZITHROMAX ) 500 mg in sodium chloride  0.9 % 250 mL IVPB  Status:  Discontinued        500 mg 250 mL/hr over 60 Minutes Intravenous  Once 07/11/24 1930 07/11/24 1955   07/11/24 1945  cefTRIAXone  (ROCEPHIN ) 2 g in sodium chloride  0.9 % 100 mL IVPB  Status:   Discontinued        2 g 200 mL/hr over 30 Minutes Intravenous  Once 07/11/24 1933 07/11/24 1955              Family Communication/Anticipated D/C date  and plan/Code Status   DVT prophylaxis: enoxaparin  (LOVENOX ) injection 40 mg Start: 07/15/24 2200 Place and maintain sequential compression device Start: 07/14/24 1044     Code Status: Full Code  Family Communication: None Disposition Plan: Plan to discharge to SNF   Status is: Inpatient Remains inpatient appropriate because: Acute respiratory failure, aspiration pneumonia       Subjective:   Interval events noted.  He said he is feeling better.  He thinks that his speech is better.  No shortness of breath or chest pain.  He still has a cough.  Objective:    Vitals:   07/21/24 0600 07/21/24 0800 07/21/24 0900 07/21/24 1000  BP: 133/69 (!) 142/84 131/71 122/76  Pulse: 96 95 98 93  Resp: (!) 21 (!) 21 (!) 24   Temp:  98.1 F (36.7 C)    TempSrc:  Axillary    SpO2: 97% 100% 96% 100%  Weight:      Height:       No data found.   Intake/Output Summary (Last 24 hours) at 07/21/2024 1139 Last data filed at 07/21/2024 0954 Gross per 24 hour  Intake 1037.99 ml  Output 625 ml  Net 412.99 ml   Filed Weights   07/19/24 0500 07/20/24 0500 07/21/24 0500  Weight: 103.2 kg 99.9 kg 98.6 kg    Exam:  GEN: NAD SKIN: Warm and dry EYES: No pallor or icterus ENT: MMM CV: RRR PULM: Bilateral rhonchi bibasilar rales ABD: soft, ND, NT, +BS CNS: AAO x 2 (person and place), non focal EXT: No edema or tenderness         Data Reviewed:   I have personally reviewed following labs and imaging studies:  Labs: Labs show the following:   Basic Metabolic Panel: Recent Labs  Lab 07/16/24 0334 07/16/24 1608 07/16/24 1611 07/17/24 0222 07/18/24 0450 07/19/24 0559 07/20/24 0531 07/21/24 0455  NA 141  --    < > 143 143 145 147* 145  K 3.2*  --    < > 3.9 3.1* 3.7 4.4 4.0  CL 104  --    < > 105 108 108 109  105  CO2 28  --    < > 27 28 32 29 33*  GLUCOSE 129*  --    < > 129* 140* 167* 178* 193*  BUN 29*  --    < > 34* 24* 23 27* 33*  CREATININE 0.99  --    < > 1.38* 1.04 0.85 1.01 1.15  CALCIUM  8.1*  --    < > 7.9* 7.8* 8.0* 8.6* 8.4*  MG 2.6* 2.4  --  2.6* 2.4 2.4  --   --   PHOS 2.7  --   --  3.1 2.4* 3.5 3.1 3.4   < > = values in this interval not displayed.   GFR Estimated Creatinine Clearance: 77.9 mL/min (by C-G formula based on SCr of 1.15 mg/dL). Liver Function Tests: Recent Labs  Lab 07/17/24 0222 07/18/24 0450 07/19/24 0559 07/20/24 0531 07/21/24 0455  ALBUMIN 2.9* 2.6* 2.6* 3.0* 2.7*   No results for input(s): LIPASE, AMYLASE in the last 168 hours. No results for input(s): AMMONIA in the last 168 hours.  Coagulation profile No results for input(s): INR, PROTIME in the last 168 hours.  CBC: Recent Labs  Lab 07/17/24 0222 07/17/24 0802 07/18/24 0450 07/18/24 0621 07/18/24 1157 07/19/24 0559 07/20/24 0531 07/21/24 0455  WBC 31.2*  --  17.2*  --   --  14.9* 12.8* 10.5  HGB 8.9*   < > 8.3* 8.6* 9.1* 8.7* 9.2* 8.0*  HCT 29.2*   < > 27.6* 28.4* 30.4* 29.6* 31.6* 26.9*  MCV 88.5  --  90.2  --   --  91.9 92.9 92.4  PLT 262  --  213  --   --  227 229 211   < > = values in this interval not displayed.   Cardiac Enzymes: No results for input(s): CKTOTAL, CKMB, CKMBINDEX, TROPONINI in the last 168 hours. BNP (last 3 results) No results for input(s): PROBNP in the last 8760 hours. CBG: Recent Labs  Lab 07/20/24 1644 07/20/24 1937 07/20/24 2351 07/21/24 0400 07/21/24 0739  GLUCAP 135* 141* 188* 176* 163*   D-Dimer: No results for input(s): DDIMER in the last 72 hours. Hgb A1c: No results for input(s): HGBA1C in the last 72 hours. Lipid Profile: No results for input(s): CHOL, HDL, LDLCALC, TRIG, CHOLHDL, LDLDIRECT in the last 72 hours.  Thyroid function studies: No results for input(s): TSH, T4TOTAL, T3FREE,  THYROIDAB in the last 72 hours.  Invalid input(s): FREET3 Anemia work up: No results for input(s): VITAMINB12, FOLATE, FERRITIN, TIBC, IRON , RETICCTPCT in the last 72 hours. Sepsis Labs: Recent Labs  Lab 07/16/24 1607 07/16/24 1928 07/17/24 0222 07/18/24 0450 07/19/24 0559 07/20/24 0531 07/21/24 0455  WBC  --   --    < > 17.2* 14.9* 12.8* 10.5  LATICACIDVEN 0.8 1.2  --   --   --   --   --    < > = values in this interval not displayed.    Microbiology Recent Results (from the past 240 hours)  Culture, blood (routine x 2)     Status: Abnormal   Collection Time: 07/11/24  7:51 PM   Specimen: BLOOD  Result Value Ref Range Status   Specimen Description   Final    BLOOD LEFT ANTECUBITAL Performed at [facility], [address], [place], [place] [address]    Special Requests   Final    BOTTLES DRAWN AEROBIC AND ANAEROBIC Blood Culture adequate volume Performed at [facility], [address], [place], [place] [address]    Culture  Setup Time   Final    GRAM POSITIVE COCCI AEROBIC BOTTLE ONLY Organism ID to follow CRITICAL RESULT CALLED TO, READ BACK BY AND VERIFIED WITH: NATHAN BELUE, PHARMACY 07/13/24 0003 JL Performed at [facility], [address] [place], [place] [address]    Culture (A)  Final    STAPHYLOCOCCUS EPIDERMIDIS THE SIGNIFICANCE OF ISOLATING THIS ORGANISM FROM A SINGLE SET OF BLOOD CULTURES WHEN MULTIPLE SETS ARE DRAWN IS UNCERTAIN. PLEASE NOTIFY THE MICROBIOLOGY DEPARTMENT WITHIN ONE WEEK IF SPECIATION AND SENSITIVITIES ARE REQUIRED. Performed at [facility], [address], [place], [place] [address]    Report Status 07/14/2024 FINAL  Final  Culture, blood (routine x 2)     Status: None   Collection Time: 07/11/24  7:51 PM   Specimen: BLOOD  Result Value Ref Range Status   Specimen Description BLOOD RIGHT ANTECUBITAL  Final   Special Requests   Final    BOTTLES DRAWN AEROBIC AND ANAEROBIC Blood  Culture results may not be optimal due to an inadequate volume of blood received in culture bottles   Culture   Final    NO GROWTH 5 DAYS Performed at [facility], [address] [place], [place] [address]    Report Status 07/16/2024 FINAL  Final  Blood Culture ID Panel (Reflexed)  Status: Abnormal   Collection Time: 07/11/24  7:51 PM  Result Value Ref Range Status   Enterococcus faecalis NOT DETECTED NOT DETECTED Final   Enterococcus Faecium NOT DETECTED NOT DETECTED Final   Listeria monocytogenes NOT DETECTED NOT DETECTED Final   Staphylococcus species DETECTED (A) NOT DETECTED Final    Comment: CRITICAL RESULT CALLED TO, READ BACK BY AND VERIFIED WITH: NATHAN BELUE, PHARMACY 07/13/24 0003 JL    Staphylococcus aureus (BCID) NOT DETECTED NOT DETECTED Final   Staphylococcus epidermidis DETECTED (A) NOT DETECTED Final    Comment: Methicillin (oxacillin) resistant coagulase negative staphylococcus. Possible blood culture contaminant (unless isolated from more than one blood culture draw or clinical case suggests pathogenicity). No antibiotic treatment is indicated for blood  culture contaminants. CRITICAL RESULT CALLED TO, READ BACK BY AND VERIFIED WITH: NATHAN BELUE, PHARMACY 07/13/24 0003 JL    Staphylococcus lugdunensis NOT DETECTED NOT DETECTED Final   Streptococcus species NOT DETECTED NOT DETECTED Final   Streptococcus agalactiae NOT DETECTED NOT DETECTED Final   Streptococcus pneumoniae NOT DETECTED NOT DETECTED Final   Streptococcus pyogenes NOT DETECTED NOT DETECTED Final   A.calcoaceticus-baumannii NOT DETECTED NOT DETECTED Final   Bacteroides fragilis NOT DETECTED NOT DETECTED Final   Enterobacterales NOT DETECTED NOT DETECTED Final   Enterobacter cloacae complex NOT DETECTED NOT DETECTED Final   Escherichia coli NOT DETECTED NOT DETECTED Final   Klebsiella aerogenes NOT DETECTED NOT DETECTED Final   Klebsiella oxytoca NOT DETECTED NOT DETECTED Final    Klebsiella pneumoniae NOT DETECTED NOT DETECTED Final   Proteus species NOT DETECTED NOT DETECTED Final   Salmonella species NOT DETECTED NOT DETECTED Final   Serratia marcescens NOT DETECTED NOT DETECTED Final   Haemophilus influenzae NOT DETECTED NOT DETECTED Final   Neisseria meningitidis NOT DETECTED NOT DETECTED Final   Pseudomonas aeruginosa NOT DETECTED NOT DETECTED Final   Stenotrophomonas maltophilia NOT DETECTED NOT DETECTED Final   Candida albicans NOT DETECTED NOT DETECTED Final   Candida auris NOT DETECTED NOT DETECTED Final   Candida glabrata NOT DETECTED NOT DETECTED Final   Candida krusei NOT DETECTED NOT DETECTED Final   Candida parapsilosis NOT DETECTED NOT DETECTED Final   Candida tropicalis NOT DETECTED NOT DETECTED Final   Cryptococcus neoformans/gattii NOT DETECTED NOT DETECTED Final   Methicillin resistance mecA/C DETECTED (A) NOT DETECTED Final    Comment: CRITICAL RESULT CALLED TO, READ BACK BY AND VERIFIED WITH: NATHAN BELUE, PHARMACY 07/13/24 0003 JL Performed at [facility], [address] [place], [place] [address]   Resp panel by RT-PCR (RSV, Flu A&B, Covid) Anterior Nasal Swab     Status: None   Collection Time: 07/11/24 10:35 PM   Specimen: Anterior Nasal Swab  Result Value Ref Range Status   SARS Coronavirus 2 by RT PCR NEGATIVE NEGATIVE Final    Comment: (NOTE) SARS-CoV-2 target nucleic acids are NOT DETECTED.  The SARS-CoV-2 RNA is generally detectable in upper respiratory specimens during the acute phase of infection. The lowest concentration of SARS-CoV-2 viral copies this assay can detect is 138 copies/mL. A negative result does not preclude SARS-Cov-2 infection and should not be used as the sole basis for treatment or other patient management decisions. A negative result may occur with  improper specimen collection/handling, submission of specimen other than nasopharyngeal swab, presence of viral mutation(s) within the areas  targeted by this assay, and inadequate number of viral copies(<138 copies/mL). A negative result must be combined with clinical observations, patient history, and epidemiological information. The expected  result is Negative.  Fact Sheet for Patients:  BloggerCourse.com  Fact Sheet for Healthcare Providers:  SeriousBroker.it  This test is no t yet approved or cleared by the [place]  FDA and  has been authorized for detection and/or diagnosis of SARS-CoV-2 by FDA under an Emergency Use Authorization (EUA). This EUA will remain  in effect (meaning this test can be used) for the duration of the COVID-19 declaration under Section 564(b)(1) of the Act, 21 U.S.C.section 360bbb-3(b)(1), unless the authorization is terminated  or revoked sooner.       Influenza A by PCR NEGATIVE NEGATIVE Final   Influenza B by PCR NEGATIVE NEGATIVE Final    Comment: (NOTE) The Xpert Xpress SARS-CoV-2/FLU/RSV plus assay is intended as an aid in the diagnosis of influenza from Nasopharyngeal swab specimens and should not be used as a sole basis for treatment. Nasal washings and aspirates are unacceptable for Xpert Xpress SARS-CoV-2/FLU/RSV testing.  Fact Sheet for Patients: BloggerCourse.com  Fact Sheet for Healthcare Providers: SeriousBroker.it  This test is not yet approved or cleared by the [place]  FDA and has been authorized for detection and/or diagnosis of SARS-CoV-2 by FDA under an Emergency Use Authorization (EUA). This EUA will remain in effect (meaning this test can be used) for the duration of the COVID-19 declaration under Section 564(b)(1) of the Act, 21 U.S.C. section 360bbb-3(b)(1), unless the authorization is terminated or revoked.     Resp Syncytial Virus by PCR NEGATIVE NEGATIVE Final    Comment: (NOTE) Fact Sheet for  Patients: BloggerCourse.com  Fact Sheet for Healthcare Providers: SeriousBroker.it  This test is not yet approved or cleared by the [place]  FDA and has been authorized for detection and/or diagnosis of SARS-CoV-2 by FDA under an Emergency Use Authorization (EUA). This EUA will remain in effect (meaning this test can be used) for the duration of the COVID-19 declaration under Section 564(b)(1) of the Act, 21 U.S.C. section 360bbb-3(b)(1), unless the authorization is terminated or revoked.  Performed at [facility], [address] [place], [place] [address]   MRSA Next Gen by PCR, Nasal     Status: None   Collection Time: 07/11/24 10:35 PM   Specimen: Nasal Mucosa; Nasal Swab  Result Value Ref Range Status   MRSA by PCR Next Gen NOT DETECTED NOT DETECTED Final    Comment: (NOTE) The GeneXpert MRSA Assay (FDA approved for NASAL specimens only), is one component of a comprehensive MRSA colonization surveillance program. It is not intended to diagnose MRSA infection nor to guide or monitor treatment for MRSA infections. Test performance is not FDA approved in patients less than 83 years old. Performed at [facility], [address] [place], [place] [address]   Respiratory (~20 pathogens) panel by PCR     Status: None   Collection Time: 07/12/24  2:15 PM   Specimen: Nasopharyngeal Swab; Respiratory  Result Value Ref Range Status   Adenovirus NOT DETECTED NOT DETECTED Final   Coronavirus 229E NOT DETECTED NOT DETECTED Final    Comment: (NOTE) The Coronavirus on the Respiratory Panel, DOES NOT test for the novel  Coronavirus (2019 nCoV)    Coronavirus HKU1 NOT DETECTED NOT DETECTED Final   Coronavirus NL63 NOT DETECTED NOT DETECTED Final   Coronavirus OC43 NOT DETECTED NOT DETECTED Final   Metapneumovirus NOT DETECTED NOT DETECTED Final   Rhinovirus / Enterovirus NOT DETECTED NOT DETECTED Final    Influenza A NOT DETECTED NOT DETECTED Final   Influenza B NOT DETECTED NOT DETECTED  Final   Parainfluenza Virus 1 NOT DETECTED NOT DETECTED Final   Parainfluenza Virus 2 NOT DETECTED NOT DETECTED Final   Parainfluenza Virus 3 NOT DETECTED NOT DETECTED Final   Parainfluenza Virus 4 NOT DETECTED NOT DETECTED Final   Respiratory Syncytial Virus NOT DETECTED NOT DETECTED Final   Bordetella pertussis NOT DETECTED NOT DETECTED Final   Bordetella Parapertussis NOT DETECTED NOT DETECTED Final   Chlamydophila pneumoniae NOT DETECTED NOT DETECTED Final   Mycoplasma pneumoniae NOT DETECTED NOT DETECTED Final    Comment: Performed at [facility], [address], [place], [place] [address]  Culture, Respiratory w Gram Stain     Status: None   Collection Time: 07/16/24  4:02 PM   Specimen: Tracheal Aspirate; Respiratory  Result Value Ref Range Status   Specimen Description   Final    TRACHEAL ASPIRATE Performed at [facility], [address] [place], [place] [address]    Special Requests   Final    NONE Performed at [facility], [address] [place], [place] [address]    Gram Stain   Final    RARE WBC PRESENT, PREDOMINANTLY PMN NO ORGANISMS SEEN    Culture   Final    NO GROWTH 2 DAYS Performed at [facility], [address], [place], [place] [address]    Report Status 07/19/2024 FINAL  Final  MRSA Next Gen by PCR, Nasal     Status: None   Collection Time: 07/17/24  8:26 AM   Specimen: Nasal Mucosa; Nasal Swab  Result Value Ref Range Status   MRSA by PCR Next Gen NOT DETECTED NOT DETECTED Final    Comment: (NOTE) The GeneXpert MRSA Assay (FDA approved for NASAL specimens only), is one component of a comprehensive MRSA colonization surveillance program. It is not intended to diagnose MRSA infection nor to guide or monitor treatment for MRSA infections. Test performance is not FDA approved in patients less than 32 years old. Performed at [facility], [address] [place], [place] [address]   Culture, blood (Routine X 2) w Reflex to ID Panel     Status: None (Preliminary result)   Collection Time: 07/17/24  9:57 AM   Specimen: BLOOD  Result Value Ref Range Status   Specimen Description BLOOD BLOOD LEFT HAND  Final   Special Requests   Final    BOTTLES DRAWN AEROBIC AND ANAEROBIC Blood Culture adequate volume   Culture   Final    NO GROWTH 3 DAYS Performed at [facility], [address] [place], [place] [address]    Report Status PENDING  Incomplete  Culture, blood (Routine X 2) w Reflex to ID Panel     Status: None (Preliminary result)   Collection Time: 07/17/24  9:57 AM   Specimen: BLOOD  Result Value Ref Range Status   Specimen Description BLOOD BLOOD LEFT WRIST  Final   Special Requests   Final    BOTTLES DRAWN AEROBIC AND ANAEROBIC Blood Culture adequate volume   Culture   Final    NO GROWTH 3 DAYS Performed at [facility], [address], [place], [place] [address]    Report Status PENDING  Incomplete    Procedures and diagnostic studies:  DG Chest Port 1 View Result Date: 07/19/2024 CLINICAL DATA:  Dyspnea EXAM: PORTABLE CHEST 1 VIEW COMPARISON:  07/18/2024 FINDINGS: Two frontal views of the chest demonstrate an enteric catheter passing below diaphragm, tip excluded by collimation. Left internal  jugular catheter tip overlies superior vena cava unchanged. 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 The cardiac silhouette is stable. Mild central vascular congestion, with a trace right pleural effusion unchanged since prior exam. The nodular density projecting over the right upper lateral chest on prior study is less well seen on this exam. This may be due to positioning. No pneumothorax. No acute bony abnormalities. IMPRESSION: 1. Pulmonary vascular congestion and small right pleural effusion, unchanged since prior study. 2. The nodular consolidation within the right upper lateral chest seen  on prior x-ray and CT is not well seen on this exam, likely due to patient positioning. Electronically Signed   By: Ozell Daring M.D.   On: 07/19/2024 17:34               LOS: 10 days   Sang Blount  [facility]   Pager on [email or web address]. If 7PM-7AM, please contact night-coverage at [email or web address]     07/21/2024, 11:39 AM

## 2024-07-21 NOTE — Progress Notes (Signed)
 Speech Language Pathology Treatment: Dysphagia  Patient Details Name: [name] MRN: [id number] DOB: [birth year] Today's Date: 07/21/2024 Time: 9159-9144 SLP Time Calculation (min) (ACUTE ONLY): 15 min  Assessment / Plan / Recommendation Clinical Impression  Pt seen for follow up dysphagia intervention. O2 requirement down to 4L, with O2 saturations maintained between 98-100 for duration of session. Significant audible congestion, with weak, minimally productive cough. Voice intensity improved. Trials completed of thin liquids via tsp and ice chips resultant in intermittent immediate throat clear, and increased wet vocal quality. Single pureed trial completed with multiple swallows and delayed cough. Oral phase grossly intact for puree manipulation and clearance.   Given intermittent s/sx of aspiration over course of dysphagia intervention, plan to complete MBSS to determine pharyngeal function. Order placed. Recommend continued NPO with allowance of ice chips following oral care until completion of assessment. MD, RN, and dietician aware of recommendations.    HPI HPI: per MD Progress Note, [name] is a 63 y.o. male with medical history significant of  GIB, alcohol  abuse, COPD on 2 L oxygen, hypertension, CAD with prior CABG and recent Stent placement,, severe aortic stenosis, HLD, stroke, dementia, chronic diastolic CHF, who presented to the hospital on 07/11/2024 with SOB, cough productive of yellowish sputum, sided chest pain.     Patient was recently hospitalized from 7/16 - 7/17 for elective LHC. Pt was s/p of successful direct stenting of distal SVG to PDA.     CTA negative for PE, showed patchy infiltration in the right upper lobe.  Patient is admitted to telemetry bed as inpatient.         He was admitted to the hospitalist service for COPD exacerbation and acute hypoxic respiratory failure.  He developed worsening respiratory distress shortly after admission.  He was placed on BiPAP and  was transferred to the ICU for further management.  He has a became agitated and this was attributed to acute alcohol  withdrawal syndrome.  He was started on IV Precedex  infusion for this.  He developed acute toxic metabolic encephalopathy and worsening respiratory failure likely from aspiration pneumonia.  He required intubation and mechanical ventilation in the ICU.  He became hypotensive with induction meds despite IV fluids and IV Levophed  infusion.  There was brief loss of pulse but ROSC was achieved after 2 minutes of CPR and 1 dose of epinephrine . DG Chest 07/18/24: Stable nodular opacity within the periphery of the right upper lobe, similar  to CT from 07/11/2024. Please refer to that report for follow-up  recommendations.  2. Mild asymmetric hazy opacification over the right mid and right lower lung,  possibly related to rotational artifact versus small posterior layering  effusion.  3. Mild subsegmental atelectasis in the left base.      SLP Plan  Continue with current plan of care          Recommendations  Diet recommendations: NPO Medication Administration: Via alternative means                  Oral care QID;Staff/trained caregiver to provide oral care   Frequent or constant Supervision/Assistance Dysphagia, pharyngeal phase (R13.13)     Continue with current plan of care    [place] Emilyanne Mcgough Clapp, MS, CCC-SLP Speech Language Pathologist Rehab Services; [facility] [phone] (ascom)   [place] J Clapp  07/21/2024, 10:16 AM

## 2024-07-22 ENCOUNTER — Inpatient Hospital Stay

## 2024-07-22 DIAGNOSIS — R0603 Acute respiratory distress: Secondary | ICD-10-CM | POA: Diagnosis not present

## 2024-07-22 LAB — GLUCOSE, CAPILLARY
Glucose-Capillary: 163 mg/dL — ABNORMAL HIGH (ref 70–99)
Glucose-Capillary: 162 mg/dL — ABNORMAL HIGH (ref 70–99)
Glucose-Capillary: 124 mg/dL — ABNORMAL HIGH (ref 70–99)
Glucose-Capillary: 123 mg/dL — ABNORMAL HIGH (ref 70–99)
Glucose-Capillary: 134 mg/dL — ABNORMAL HIGH (ref 70–99)
Glucose-Capillary: 134 mg/dL — ABNORMAL HIGH (ref 70–99)

## 2024-07-22 LAB — CBC
HCT: 28.3 % — ABNORMAL LOW (ref 39.0–52.0)
Hemoglobin: 8.6 g/dL — ABNORMAL LOW (ref 13.0–17.0)
MCH: 27.8 pg (ref 26.0–34.0)
MCHC: 30.4 g/dL (ref 30.0–36.0)
MCV: 91.6 fL (ref 80.0–100.0)
Platelets: 247 K/uL (ref 150–400)
RBC: 3.09 MIL/uL — ABNORMAL LOW (ref 4.22–5.81)
RDW: 16.8 % — ABNORMAL HIGH (ref 11.5–15.5)
WBC: 14 K/uL — ABNORMAL HIGH (ref 4.0–10.5)
nRBC: 0 % (ref 0.0–0.2)

## 2024-07-22 LAB — BASIC METABOLIC PANEL WITH GFR
Anion gap: 9 (ref 5–15)
BUN: 36 mg/dL — ABNORMAL HIGH (ref 8–23)
CO2: 33 mmol/L — ABNORMAL HIGH (ref 22–32)
Calcium: 8.6 mg/dL — ABNORMAL LOW (ref 8.9–10.3)
Chloride: 104 mmol/L (ref 98–111)
Creatinine, Ser: 0.97 mg/dL (ref 0.61–1.24)
GFR, Estimated: >60 mL/min (ref >60)
Glucose, Bld: 186 mg/dL — ABNORMAL HIGH (ref 70–99)
Potassium: 3.9 mmol/L (ref 3.5–5.1)
Sodium: 146 mmol/L — ABNORMAL HIGH (ref 135–145)

## 2024-07-22 LAB — TYPE AND SCREEN
ABO/RH(D): A POS
Antibody Screen: NEGATIVE
Unit division: 0

## 2024-07-22 LAB — BPAM RBC
Blood Product Expiration Date: 202509062359
ISSUE DATE / TIME: 202508041443
Unit Type and Rh: 6200

## 2024-07-22 LAB — CULTURE, BLOOD (ROUTINE X 2)
Culture: NO GROWTH
Culture: NO GROWTH
Special Requests: ADEQUATE
Special Requests: ADEQUATE

## 2024-07-22 MED ORDER — FREE WATER
100.0000 mL | Status: DC
  Administered 2024-07-22 – 2024-07-23 (×5): 100 mL

## 2024-07-22 MED ORDER — FUROSEMIDE 10 MG/ML IJ SOLN
20.0000 mg | Freq: Once | INTRAMUSCULAR | Status: AC
  Administered 2024-07-22: 20 mg via INTRAVENOUS
  Filled 2024-07-22: qty 2

## 2024-07-22 NOTE — Progress Notes (Signed)
 Nutrition Follow Up Note   DOCUMENTATION CODES:   Not applicable  INTERVENTION:   Continue Pivot 1.5@60ml /hr   Free water  flushes 100ml q4 hours  Regimen provides 2160kcal/day, 135g/day protein and 1685ml/day of free water .   Continue MVI, thiamine  and folic acid  daily   Cholecalciferol  2,000 units daily via tube   Daily weights   NUTRITION DIAGNOSIS:   Inadequate oral intake related to lethargy/confusion as evidenced by NPO status. -ongoing   GOAL:   Patient will meet greater than or equal to 90% of their needs -met   MONITOR:   Diet advancement, Labs, Weight trends, TF tolerance, Skin, I & O's  ASSESSMENT:   63 y/o male with h/o HLD, etoh abuse, HTN, CAD s/p CABG x 3 (2013), severe aortic stenosis, CHF, CVA, IDA, COPD, MI, GERD, hiatal hernia, CKD III, intestional AVM, depression and recent admission for elective left heart catheterization s/p left heart cath and stent placement 7/16 and who is now admitted with NSTEMI, CHF/COPD exacerbation, suspected aspiration and etoh withdrawal.  Pt extubated 8/1. Pt remains NPO. Met with pt in room today. Pt complaining of abdominal pain and congestion. Pt is moderately distended today. Pt does have active bowel sounds per RN. Pt has been tolerating tube feeds at goal rate. Pt is having bowel function. Tube feeds held this morning for planned MBSS; study postponed however secondary to worsening respiratory status. NGT noted to be at 50cm this morning and was advanced to 70cm per RN. KUB within normal limits. Pt with mild hypernatremia today; will increase free water . RD will monitor for diet advancement.    Per chart, pt is down ~3lbs since admission but remains up ~7lbs from his UBW. Pt +2.4L on his I & Os. Lasix  given today.   Medications reviewed and include: vitamin C , aspirin , D3, plavix , colace, lovenox , folic acid , iron , MVI, protonix , miralax , thiamine   Labs reviewed: Na 146(H), K 3.9 wnl, BUN 36(H) Vitamin D  12.02(L)-  04/2024 Wbc- 14.0(H), Hgb 8.6(L), Hct 28.3(L) Cbgs- 124, 162, 163 x 24 hrs   UOP-   Diet Order:   Diet Order             Diet NPO time specified  Diet effective now                  EDUCATION NEEDS:   No education needs have been identified at this time  Skin:  Skin Assessment: Reviewed RN Assessment (ecchymosis)  Last BM:  8/5- type 6  Height:   Ht Readings from Last 1 Encounters:  07/11/24 6' (1.829 m)    Weight:   Wt Readings from Last 1 Encounters:  07/22/24 98.6 kg    Ideal Body Weight:  80.9 kg  BMI:  Body mass index is 29.48 kg/m.  Estimated Nutritional Needs:   Kcal:  2300-2600kcal/day  Protein:  115-130g/day  Fluid:  2.0L/day  Augustin Shams MS, RD, LDN If unable to be reached, please send secure chat to RD inpatient available from 8:00a-4:00p daily

## 2024-07-22 NOTE — Progress Notes (Signed)
 [facility] CARDIOLOGY PROGRESS NOTE       Patient ID: [name] MRN: [id number] DOB/AGE: 26-Oct-1961 63 y.o.  Admit date: 07/11/2024 Referring Physician Dr. Charlie Sellar Primary Physician Osa Geralds, NP  Primary Cardiologist Tinnie Maiden, NP  Reason for Consultation elevated troponins  HPI: [name] is a 63 y.o. 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 Found to have pneumonia, overnight troponins uptrending. Cardiology was consulted for further evaluation.   Interval history: -Patient seen and examined this AM, resting in bed. Patient reports soreness of bilateral abdomen/sides and mild chest wall tenderness, worse with palpation. Patient still with SOB, weaned to 2L O2.  -Patient BP improving, HR stable -Per tele no more evidence of ventricular ectopy, in SR rate 90s. -Urine very dark this AM. CO2 33.   Review of systems complete and found to be negative unless listed above    Past Medical History:  Diagnosis Date   COPD (chronic obstructive pulmonary disease) (HCC)    CVA (cerebral infarction)    Headache    mild, since stroke 2012   Hypertension    MI (myocardial infarction) (HCC) 05/09/2012   Reading difficulty    pt reports he reads at about a second grade level   Stroke [facility]) 2012   numbness to left hand    Wears dentures    full upper and lower    Past Surgical History:  Procedure Laterality Date   CAROTID ENDARTERECTOMY Left    2012 or 2013   CHOLECYSTECTOMY N/A 07/26/2016   Procedure: LAPAROSCOPIC CHOLECYSTECTOMY;  Surgeon: Charlie FORBES Fell, MD;  Location: [facility] ORS;  Service: General;  Laterality: N/A;    COLONOSCOPY N/A 05/08/2024   Procedure: COLONOSCOPY;  Surgeon: Jinny Carmine, MD;  Location: [facility] ENDOSCOPY;  Service: Endoscopy;  Laterality: N/A;   COLONOSCOPY WITH PROPOFOL  N/A 02/09/2022   Procedure: COLONOSCOPY WITH PROPOFOL ;  Surgeon: Jinny Carmine, MD;  Location: [facility] ENDOSCOPY;  Service: Endoscopy;  Laterality: N/A;   CORONARY ARTERY BYPASS GRAFT  05/10/2012   3 vessel   CORONARY STENT INTERVENTION N/A 07/02/2024   Procedure: CORONARY STENT INTERVENTION;  Surgeon: Katina Albright, MD;  Location: [facility] INVASIVE CV LAB;  Service: Cardiovascular;  Laterality: N/A;   CORONARY ULTRASOUND/IVUS N/A 07/02/2024   Procedure: Coronary Ultrasound/IVUS;  Surgeon: Katina Albright, MD;  Location: [facility] INVASIVE CV LAB;  Service: Cardiovascular;  Laterality: N/A;   ENTEROSCOPY N/A 05/16/2023   Procedure: ENTEROSCOPY;  Surgeon: Therisa Bi, MD;  Location: [facility] ENDOSCOPY;  Service: Gastroenterology;  Laterality: N/A;   ENTEROSCOPY N/A 06/01/2023   Procedure: ENTEROSCOPY;  Surgeon: Unk Corinn Skiff, MD;  Location: [facility] ENDOSCOPY;  Service: Gastroenterology;  Laterality: N/A;   ESOPHAGOGASTRODUODENOSCOPY  05/13/2023   Procedure: ESOPHAGOGASTRODUODENOSCOPY (EGD);  Surgeon: Jinny Carmine, MD;  Location: [facility] ENDOSCOPY;  Service: Endoscopy;;   ESOPHAGOGASTRODUODENOSCOPY (EGD) WITH PROPOFOL  N/A 10/28/2022   Procedure: ESOPHAGOGASTRODUODENOSCOPY (EGD) WITH PROPOFOL ;  Surgeon: Onita Elspeth Sharper, DO;  Location: [facility] ENDOSCOPY;  Service: Gastroenterology;  Laterality: N/A;   GIVENS CAPSULE STUDY  05/13/2023   Procedure: GIVENS CAPSULE STUDY;  Surgeon: Jinny Carmine, MD;  Location: [facility] ENDOSCOPY;  Service: Endoscopy;;   GIVENS CAPSULE STUDY  06/01/2023   Procedure: KRISTENE  CAPSULE STUDY;  Surgeon: Unk Corinn Skiff, MD;  Location: [facility] ENDOSCOPY;  Service: Gastroenterology;;   LEFT HEART CATH AND CORS/GRAFTS ANGIOGRAPHY N/A 07/02/2024   Procedure: LEFT HEART CATH AND CORS/GRAFTS ANGIOGRAPHY;  Surgeon: Katina Albright, MD;  Location:  [facility] INVASIVE CV LAB;  Service: Cardiovascular;  Laterality: N/A;    Medications Prior to Admission  Medication Sig Dispense Refill Last Dose/Taking   albuterol  (VENTOLIN  HFA) 108 (90 Base) MCG/ACT inhaler Inhale 2 puffs into the lungs every 6 (six) hours as needed for wheezing or shortness of breath. 6.7 g 2 07/11/2024 Morning   ascorbic acid  (VITAMIN C ) 500 MG tablet Take 1 tablet (500 mg total) by mouth daily. 30 tablet 2 07/11/2024 Morning   aspirin  EC 81 MG tablet Take 1 tablet (81 mg total) by mouth daily. Swallow whole. 30 tablet 5 07/11/2024 Morning   clopidogrel  (PLAVIX ) 75 MG tablet Take 1 tablet (75 mg total) by mouth daily with breakfast. 30 tablet 11 07/11/2024 Morning   dapagliflozin  propanediol (FARXIGA ) 10 MG TABS tablet Take 1 tablet (10 mg total) by mouth daily. 30 tablet 11 07/11/2024 Morning   folic acid  (FOLVITE ) 1 MG tablet Take 1 tablet (1 mg total) by mouth daily. 30 tablet 2 07/11/2024   iron  polysaccharides (NIFEREX) 150 MG capsule Take 1 capsule (150 mg total) by mouth daily. 30 capsule 2 07/11/2024   isosorbide  mononitrate (IMDUR ) 60 MG 24 hr tablet Take 1 tablet (60 mg total) by mouth daily. 30 tablet 11 07/11/2024   losartan  (COZAAR ) 25 MG tablet Take 0.5 tablets (12.5 mg total) by mouth daily. 30 tablet 11 07/11/2024 Morning   metoprolol  succinate (TOPROL -XL) 25 MG 24 hr tablet Take 0.5 tablets (12.5 mg total) by mouth daily. 30 tablet 11 07/11/2024 Morning   pantoprazole  (PROTONIX ) 40 MG tablet Take 1 tablet (40 mg total) by mouth daily. 30 tablet 2 07/11/2024 Morning   pravastatin  (PRAVACHOL ) 20 MG tablet Take 1 tablet (20 mg total) by mouth at bedtime. 30 tablet 11 07/10/2024 Evening   spironolactone  (ALDACTONE ) 25 MG tablet Take 0.5 tablets (12.5 mg total) by mouth daily. 30 tablet 11 07/11/2024 Morning   torsemide  (DEMADEX ) 10 MG tablet Take 1 tablet (10 mg total) by mouth daily. 30 tablet 11 07/11/2024   Social History   Socioeconomic History   Marital status: Divorced     Spouse name: Not on file   Number of children: Not on file   Years of education: Not on file   Highest education level: Not on file  Occupational History   Not on file  Tobacco Use   Smoking status: Former    Current packs/day: 0.00    Types: Cigarettes    Quit date: 25    Years since quitting: 32.6   Smokeless tobacco: Current    Types: Snuff   Tobacco comments:    changed to dip  Vaping Use   Vaping status: Never Used  Substance and Sexual Activity   Alcohol  use: Yes    Alcohol /week: 26.0 standard drinks of alcohol     Types: 26 Cans of beer per week   Drug use: No   Sexual activity: Yes    Birth control/protection: None  Other Topics Concern   Not on file  Social History Narrative   Not on file   Social Drivers of Health   Financial Resource Strain: Not on file  Food Insecurity: No Food Insecurity (07/12/2024)   Hunger Vital Sign    Worried About Running Out of Food in the  Last Year: Never true    Ran Out of Food in the Last Year: Never true  Transportation Needs: No Transportation Needs (07/12/2024)   PRAPARE - Administrator, Civil Service (Medical): No    Lack of Transportation (Non-Medical): No  Physical Activity: Not on file  Stress: Not on file  Social Connections: Socially Isolated (07/12/2024)   Social Connection and Isolation Panel    Frequency of Communication with Friends and Family: Once a week    Frequency of Social Gatherings with Friends and Family: Once a week    Attends Religious Services: Never    Database administrator or Organizations: No    Attends Banker Meetings: Never    Marital Status: Divorced  Catering manager Violence: Not At Risk (07/12/2024)   Humiliation, Afraid, Rape, and Kick questionnaire    Fear of Current or Ex-Partner: No    Emotionally Abused: No    Physically Abused: No    Sexually Abused: No    Family History  Problem Relation Age of Onset   Pneumonia Mother      Vitals:   07/22/24 0731  07/22/24 0737 07/22/24 0739 07/22/24 0800  BP:    121/73  Pulse: 88 (!) 102 (!) 101 97  Resp: (!) 22 (!) 22 (!) 21 20  Temp:  98.5 F (36.9 C)    TempSrc:  Oral    SpO2: (!) 89% 100% 96% 90%  Weight:      Height:        PHYSICAL EXAM General: Ill appearing male, no acute distress. HEENT: Normocephalic and atraumatic. Neck: No JVD.  Lungs: Bilateral rhonchi. Heart: HRRR, Normal S1 and S2 without gallops. +systolic ejection murmur.   Abdomen: Non-distended appearing.  Msk: Normal strength and tone for age. Extremities: Warm and well perfused. No clubbing, cyanosis, edema.   Labs: Basic Metabolic Panel: Recent Labs    07/20/24 0531 07/21/24 0455 07/22/24 0416  NA 147* 145 146*  K 4.4 4.0 3.9  CL 109 105 104  CO2 29 33* 33*  GLUCOSE 178* 193* 186*  BUN 27* 33* 36*  CREATININE 1.01 1.15 0.97  CALCIUM  8.6* 8.4* 8.6*  PHOS 3.1 3.4  --    Liver Function Tests: Recent Labs    07/20/24 0531 07/21/24 0455  ALBUMIN 3.0* 2.7*   No results for input(s): LIPASE, AMYLASE in the last 72 hours. CBC: Recent Labs    07/21/24 0455 07/22/24 0416  WBC 10.5 14.0*  HGB 8.0* 8.6*  HCT 26.9* 28.3*  MCV 92.4 91.6  PLT 211 247   Cardiac Enzymes: No results for input(s): CKTOTAL, CKMB, CKMBINDEX, TROPONINIHS in the last 72 hours.   BNP: No results for input(s): BNP in the last 72 hours.  D-Dimer: No results for input(s): DDIMER in the last 72 hours. Hemoglobin A1C: No results for input(s): HGBA1C in the last 72 hours. Fasting Lipid Panel: No results for input(s): CHOL, HDL, LDLCALC, TRIG, CHOLHDL, LDLDIRECT in the last 72 hours.  Thyroid Function Tests: No results for input(s): TSH, T4TOTAL, T3FREE, THYROIDAB in the last 72 hours.  Invalid input(s): FREET3 Anemia Panel: No results for input(s): VITAMINB12, FOLATE, FERRITIN, TIBC, IRON , RETICCTPCT in the last 72 hours.   Radiology: [facility] Chest Port 1 View Result Date:  07/19/2024 CLINICAL DATA:  Dyspnea EXAM: PORTABLE CHEST 1 VIEW COMPARISON:  07/18/2024 FINDINGS: Two frontal views of the chest demonstrate an enteric catheter passing below diaphragm, tip excluded by collimation. Left internal jugular catheter tip overlies superior  vena cava unchanged. 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 The cardiac silhouette is stable. Mild central vascular congestion, with a trace right pleural effusion unchanged since prior exam. The nodular density projecting over the right upper lateral chest on prior study is less well seen on this exam. This may be due to positioning. No pneumothorax. No acute bony abnormalities. IMPRESSION: 1. Pulmonary vascular congestion and small right pleural effusion, unchanged since prior study. 2. The nodular consolidation within the right upper lateral chest seen on prior x-ray and CT is not well seen on this exam, likely due to patient positioning. Electronically Signed   By: Ozell Daring M.D.   On: 07/19/2024 17:34   DG Chest Port 1 View Result Date: 07/18/2024 EXAM: 1 VIEW XRAY OF THE CHEST 07/18/2024 04:59:06 AM COMPARISON: 07/16/2024 CLINICAL HISTORY: 427266 Acute respiratory failure with hypoxia (HCC) 427266. Acute respiratory failure with hypoxia FINDINGS: LUNGS AND PLEURA: Nodular opacity within the periphery of the right upper lobe appears similar to CT from 07/11/2024. Please refer to that report for follow-up recommendations. Mild asymmetric hazy opacification over the right mid and right lower lung may be related to positional artifact versus small posterior layering effusion. Mild subsegmental atelectasis in the left base. HEART AND MEDIASTINUM: Stable cardiomediastinal contours. Previous median sternotomy and CABG procedure. BONES AND SOFT TISSUES: Aortic atherosclerotic calcification. Left IJ catheter is identified with the tip in the projection of the SVC. There is an enteric tube which courses below the field of view. ET tube tip is stable  above the carina. IMPRESSION: 1. Stable nodular opacity within the periphery of the right upper lobe, similar to CT from 07/11/2024. Please refer to that report for follow-up recommendations. 2. Mild asymmetric hazy opacification over the right mid and right lower lung, possibly related to rotational artifact versus small posterior layering effusion. 3. Mild subsegmental atelectasis in the left base. Electronically signed by: Waddell Calk MD 07/18/2024 06:51 AM EDT RP Workstation: [id number]   DG Chest Port 1 View Result Date: 07/16/2024 CLINICAL DATA:  Endotracheal tube and central line placement. EXAM: PORTABLE CHEST 1 VIEW COMPARISON:  07/16/2024 at 8:58 a.m. FINDINGS: Patient is rotated to the right. Endotracheal tube has tip proximally 13.5 cm above the carina. This could be advanced approximately 6 cm. Left IJ central venous catheter has tip over the SVC. Enteric tube courses into the region of the stomach and off the image as tip is not visualized. Lungs are adequately inflated with possible mild hazy opacification over the right upper lung abutting the minor fissure which may be due to atelectasis or infection. Left lung is clear. Cardiomediastinal silhouette and remainder of the exam is unchanged. IMPRESSION: 1. Endotracheal tube has tip proximally 13.5 cm above the carina. This could be advanced approximately 6 cm. 2. Left IJ central venous catheter with tip over the SVC. 3. Possible mild hazy opacification over the right upper lung abutting the minor fissure which may be due to atelectasis or infection. Electronically Signed   By: Toribio Agreste M.D.   On: 07/16/2024 10:53   DG Chest Port 1 View Result Date: 07/16/2024 CLINICAL DATA:  [id number] Aspiration into airway [id number]. EXAM: PORTABLE CHEST 1 VIEW COMPARISON:  06/23/2024. FINDINGS: Bilateral lungs appear hyperlucent with coarse bronchovascular markings, in keeping with COPD. Bilateral lungs otherwise appear clear. No dense consolidation or lung  collapse. Bilateral costophrenic angles are clear. Stable cardio-mediastinal silhouette. There are surgical staples along the heart border and sternotomy wires, status post CABG (coronary artery bypass graft). No acute  osseous abnormalities. The soft tissues are within normal limits. Enteric tube is seen coursing below the left hemidiaphragm however, the tip is not included on the film. IMPRESSION: No active disease. COPD. Electronically Signed   By: Ree Molt M.D.   On: 07/16/2024 09:09   DG Abd 1 View Result Date: 07/16/2024 CLINICAL DATA:  Vomiting.  NG tube placement. EXAM: ABDOMEN - 1 VIEW COMPARISON:  07/15/2024 FINDINGS: Nasogastric tube has tip and side-port over the stomach in the upper abdomen just left of midline. Visualized lower thorax unchanged. Abdominopelvic images demonstrate a non obstructive bowel gas pattern. Mild fecal retention throughout the colon. Surgical clips over the right upper quadrant. There are degenerative changes of the spine and hips. Calcified plaque over the abdominal aorta and iliac arteries. IMPRESSION: 1. Nasogastric tube with tip and side-port over the stomach. 2. Nonobstructive bowel gas pattern. Mild fecal retention. 3. Aortic atherosclerosis. Electronically Signed   By: Toribio Agreste M.D.   On: 07/16/2024 07:51   DG Abd 1 View Result Date: 07/15/2024 CLINICAL DATA:  Orogastric tube placement. EXAM: ABDOMEN - 1 VIEW COMPARISON:  June 29, 2023. FINDINGS: Distal tip of nasogastric tube is seen in expected position of proximal stomach. IMPRESSION: Nasogastric tube tip seen in expected position of proximal stomach. Electronically Signed   By: Lynwood Landy Raddle M.D.   On: 07/15/2024 10:25   DG Chest Port 1 View Result Date: 07/13/2024 CLINICAL DATA:  Respiratory failure with hypoxia. EXAM: PORTABLE CHEST 1 VIEW COMPARISON:  Multiple previous chest x-rays and recent chest CT. FINDINGS: Stable surgical changes from bypass surgery. The cardiac silhouette, mediastinal  and hilar contours are within normal limits and stable. Underlying emphysematous changes and pulmonary scarring. No pleural effusions or discrete pulmonary infiltrates. No pneumothorax. IMPRESSION: Emphysematous changes and pulmonary scarring but no acute overlying pulmonary process. Electronically Signed   By: MYRTIS Stammer M.D.   On: 07/13/2024 16:08   US  Abdomen Limited RUQ (LIVER/GB) Result Date: 07/13/2024 CLINICAL DATA:  Portal hypertension. EXAM: ULTRASOUND ABDOMEN LIMITED RIGHT UPPER QUADRANT COMPARISON:  Abdominal ultrasound 10/19/2022 FINDINGS: Gallbladder: Surgically absent. Common bile duct: Diameter: 3.3 mm Liver: No focal lesion identified. Parenchymal echogenicity is increased and diffusely heterogeneous. There is lobular liver contour. Portal vein is patent on color Doppler imaging with normal direction of blood flow towards the liver. Other: None. IMPRESSION: Lobular liver contour with increased echogenicity and heterogeneous parenchyma consistent with cirrhosis. No focal liver lesion identified. Electronically Signed   By: Greig Pique M.D.   On: 07/13/2024 16:03   DG Chest Port 1 View Result Date: 07/12/2024 CLINICAL DATA:  Shortness of breath. EXAM: PORTABLE CHEST 1 VIEW COMPARISON:  07/11/2024 FINDINGS: The cardio pericardial silhouette is enlarged. There is pulmonary vascular congestion without overt pulmonary edema. Trace bilateral pleural effusions. Old posterior left sixth rib fracture. Telemetry leads overlie the chest. IMPRESSION: Enlargement of the cardiopericardial silhouette with new pulmonary vascular congestion and trace bilateral pleural effusions. Electronically Signed   By: Camellia Candle M.D.   On: 07/12/2024 12:11   CT Angio Chest PE W/Cm &/Or Wo Cm Result Date: 07/11/2024 CLINICAL DATA:  High probability for PE. Chest pain with shortness of breath EXAM: CT ANGIOGRAPHY CHEST WITH CONTRAST TECHNIQUE: Multidetector CT imaging of the chest was performed using the standard  protocol during bolus administration of intravenous contrast. Multiplanar CT image reconstructions and MIPs were obtained to evaluate the vascular anatomy. RADIATION DOSE REDUCTION: This exam was performed according to the departmental dose-optimization program which includes automated exposure  control, adjustment of the mA and/or kV according to patient size and/or use of iterative reconstruction technique. CONTRAST:  75mL OMNIPAQUE  IOHEXOL  350 MG/ML SOLN COMPARISON:  CT angiogram chest 05/09/2024 FINDINGS: Cardiovascular: Satisfactory opacification of the pulmonary arteries to the segmental level. No evidence of pulmonary embolism. Patient is status post cardiac surgery. The heart is enlarged. There is no pericardial effusion. There are atherosclerotic calcifications of the aorta and coronary arteries. Aorta is normal in size. Mediastinum/Nodes: There are enlarged right hilar lymph nodes measuring up to 11 mm. Enlarged paratracheal lymph nodes measure up to 10 mm. Visualized thyroid gland and esophagus are within normal limits. Lungs/Pleura: Moderate emphysema again seen. There is stable scarring in the right lung apex. There is new patchy slightly nodular airspace disease in the right upper lobe measuring 2.0 by 1.1 cm. There is right-sided central peribronchial wall thickening. Previously identified airspace disease in the left lower lobe has resolved. There is no pleural effusion or pneumothorax. There is a stable left lower lobe pulmonary nodule image 6/57. Upper Abdomen: No acute abnormality. Musculoskeletal: No chest wall abnormality. No acute or significant osseous findings. Review of the MIP images confirms the above findings. IMPRESSION: 1. No evidence for pulmonary embolism. 2. New patchy slightly nodular airspace disease in the right upper lobe worrisome for pneumonia. Follow-up CT recommended in 4-6 weeks recommended to ensure resolution and to exclude underlying nodule. 3. Right-sided central  peribronchial wall thickening worrisome for bronchitis. 4. Right hilar and mediastinal lymphadenopathy, likely reactive. 5. Stable left lower lobe pulmonary nodule. 6. Cardiomegaly. Aortic Atherosclerosis (ICD10-I70.0) and Emphysema (ICD10-J43.9). Electronically Signed   By: Greig Pique M.D.   On: 07/11/2024 18:54   DG Chest Portable 1 View Result Date: 07/11/2024 CLINICAL DATA:  SOB EXAM: PORTABLE CHEST - 1 VIEW COMPARISON:  June 24, 2024 FINDINGS: Biapical pleural thickening. Sternotomy wires and CABG markers. No focal airspace consolidation, pleural effusion, or pneumothorax. Mild cardiomegaly. Tortuous aorta with aortic atherosclerosis. No acute fracture or destructive lesions. Multilevel thoracic osteophytosis. IMPRESSION: No acute cardiopulmonary abnormality. Electronically Signed   By: Rogelia Myers M.D.   On: 07/11/2024 17:25   CARDIAC CATHETERIZATION Result Date: 07/02/2024   Prox LAD to Mid LAD lesion is 90% stenosed.   Ost LM to Mid LM lesion is 50% stenosed.   Ost Cx to Prox Cx lesion is 90% stenosed.   Ost RCA to Prox RCA lesion is 100% stenosed.   Mid Graft to Dist Graft lesion is 95% stenosed.   Recommend uninterrupted dual antiplatelet therapy with Aspirin  81mg  daily and Clopidogrel  75mg  daily for a minimum of 6 months (stable ischemic heart disease-Class I recommendation). 1.  Severe native three-vessel CAD 2.  LIMA to LAD and SVG to OM widely patent 3.  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  Successful direct stenting of the distal SVG to PDA with distal protection with a 4.0 x 15 mm drug-eluting stent with intravascular ultrasound guidance 5.  Aspirin  and clopidogrel  for at least 6 months followed by clopidogrel  indefinitely 6.  Continue workup for aortic valve replacement   DG Chest 2 View Result Date: 06/24/2024 CLINICAL DATA:  sob EXAM: CHEST - 2 VIEW COMPARISON:  June 03, 2024 FINDINGS: Biapical pleural thickening. No focal airspace consolidation, pleural effusion,  or pneumothorax. No cardiomegaly. Sternotomy wires and CABG markers. Tortuous aorta with aortic atherosclerosis. No acute fracture or destructive lesions. Multilevel thoracic osteophytosis. Osteopenia. IMPRESSION: No acute cardiopulmonary abnormality. Electronically Signed   By: Rogelia Myers  M.D.   On: 06/24/2024 11:54    ECHO 04/2024: 1. Left ventricular ejection fraction, by estimation, is 60 to 65%. The left ventricle has normal function. The left ventricle has no regional wall motion abnormalities. The left ventricular internal cavity size was mildly dilated. Left ventricular diastolic parameters are consistent with Grade I diastolic dysfunction (impaired relaxation).   2. Right ventricular systolic function is normal. The right ventricular  size is normal.   3. The mitral valve is normal in structure. No evidence of mitral valve  regurgitation.   4. The aortic valve is normal in structure. Aortic valve regurgitation is  trivial. Severe aortic valve stenosis.   TELEMETRY reviewed by me 07/22/2024: sinus rhythm, rate 90s  EKG reviewed by me: sinus tachycardia ST depression, rate 129 bpm  Data reviewed by me 07/22/2024: last 24h vitals tele labs imaging I/O hospitalist progress note, PCCM notes  Principal Problem:   Acute respiratory distress Active Problems:   Hyperlipidemia   Cognitive impairment   Alcohol  abuse   Essential hypertension   CAD (coronary artery disease)   Stroke (HCC)   Iron  deficiency anemia   Acute on chronic diastolic CHF (congestive heart failure) (HCC)   COPD exacerbation (HCC)   Elevated lactic acid level   Lobar pneumonia (HCC)   Metabolic acidosis, increased anion gap   Myocardial injury    ASSESSMENT AND PLAN:  Baine Decesare is a 63 y.o. 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 Found to have pneumonia, overnight troponins uptrending. Cardiology was consulted for further evaluation.   # Ventricular Ectopy Per tele patient with bigeminy PVCs, started on IV amio on 07/30 for ventricular ectopy. PVC much improved s/p IV amio. Per tele this AM SR with rate 90s with no more ventricular ectopy -Continue to monitor telemetry closely.  -Monitor and replenish electrolytes for a goal K >4, Mag >2  -Continue Amio 400 mg BID for 10 days, then 200 mg daily.   # Acute hypoxic respiratory failure, mechanical ventilation # Aspiration pneumonia # Alcohol  withdrawal, improving # Acute on chronic HFpEF (flash pulmonary edema) # Severe aortic stenosis Presented with SOB, cough, imaging concerning for HCAP/COPD exacerbation. LA elevated on presentation at 3.6 > 5.9> 3.4. s/p extubation 08/01, weaned to 4L [place].  -Will plan to resume home torsemide  10 mg daily when alkalosis improves.  -Will resume home dapagliflozin  10 mg daily when able to take PO medication. -Will plan to optimize GDMT as BP and renal function allows and when able to take PO.  (Home meds: losartan , metoprolol  succinate, spironolactone ) -Further management of respiratory failure/ aspiration PNA as per primary team.  -CIWA protocol given hx of alcohol  abuse. -Scheduled to see Dr. Katina 7/30 to discuss aortic valve repair/replacement options. Will reschedule this appointment when patient stabilizes.    # NSTEMI # Coronary artery disease s/p CABG x3 (04/2012) # Hypertension # Hyperlipidemia With recent LHC, s/p DES to SVG to PDA 07/02/2024. Reported some CP when he first came to hospital which has now resolved, no recurrence. Troponin trended 44 > 144 > 205 > 980 > 1862 > 5766 > 5756. Patient weaned off levo on 07/31, BP improving and  stabilizing. -Continue home  plavix  75 mg daily. Continue aspirin  81 mg daily. Continue DAPT due to recent stent placement on 07/16. -Started on heparin  gtt by [facility] 7/26, Low Hgb. Completed 24 hours of heparin .  -Continue Crestor  20 mg daily for high-intensity statin management.  -Will resume home metoprolol  succinate when able to tolerate PO. -Home losartan , Imdur  held due to BP borderline. Will consider resuming as BP continues to improve and able to tolerate PO. -Recommend MAP > 65.   # Acute on chronic anemia # Hx AVM Patient received blood transfusion on 07/28 and 07/31.  Hgb 8.7 > 9.2 > 8.0 > 8.6 -Closely monitor H&H. -Continue PPIs. -Goal Hgb >8 due to cardiac history. Recommend transfusion. 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 -Management per primary team.   Did the patient have an acute coronary syndrome (MI, NSTEMI, STEMI, etc) this admission?:  Yes                               AHA/ACC ACS Clinical Performance & Quality Measures: Aspirin  prescribed? - Yes ADP Receptor Inhibitor (Plavix /Clopidogrel , Brilinta/Ticagrelor or Effient/Prasugrel) prescribed (includes medically managed patients)? - Yes Beta Blocker prescribed? - Yes High Intensity Statin (Lipitor 40-80mg  or Crestor  20-40mg ) prescribed? - Yes EF assessed during THIS hospitalization? - No - patient with recent echo performed on 05/10/2024.  For EF <40%, was ACEI/ARB prescribed? - Not Applicable (EF >/= 40%) For EF <40%, Aldosterone Antagonist (Spironolactone  or Eplerenone) prescribed? - Not Applicable (EF >/= 40%) Cardiac Rehab Phase II ordered (including medically managed patients)? - Yes, Patient with recent outpatient LHC performed on 07/16, referred to cardiac rehab during that time on 07/04/24.     This patient's plan of care was discussed and created with Dr. Florencio and he is in agreement.  Signed: Kemiah Booz, PA-C  07/22/2024, 8:46  AM [facility]

## 2024-07-22 NOTE — Progress Notes (Signed)
 Progress Note    [name]  FMW:[id number] DOB: July 03, 1961  DOA: 07/11/2024 PCP: Osa Geralds, NP      Brief Narrative:    Medical records reviewed and are as summarized below:  [name] is a 63 y.o. male with medical history significant of  GIB, alcohol  abuse, COPD on 2 L oxygen, hypertension, CAD with prior CABG and recent Stent placement,, severe aortic stenosis, HLD, stroke, dementia, chronic diastolic CHF, who presented to the hospital on 07/11/2024 with SOB, cough productive of yellowish sputum, sided chest pain.   Patient was recently hospitalized from 7/16 - 7/17 for elective LHC. Pt was s/p of successful direct stenting of distal SVG to PDA.  CTA negative for PE, showed patchy infiltration in the right upper lobe.  Patient is admitted to telemetry bed as inpatient.    He was admitted to the hospitalist service for COPD exacerbation and acute hypoxic respiratory failure.  He developed worsening respiratory distress shortly after admission.  He was placed on BiPAP and was transferred to the ICU for further management.  He has a became agitated and this was attributed to acute alcohol  withdrawal syndrome.  He was started on IV Precedex  infusion for this. He developed acute toxic metabolic encephalopathy and worsening respiratory failure likely from aspiration pneumonia.  He required intubation and mechanical ventilation in the ICU.  He became hypotensive with induction meds despite IV fluids and IV Levophed  infusion.  There was brief loss of pulse but ROSC was achieved after 2 minutes of CPR and 1 dose of epinephrine .    Significant Hospital Events: Including procedures, antibiotic start and stop dates in addition to other pertinent events   7/25: admit with respiratory failure 7/26: clinically decompensated, worsening respiratory status initiated on BiPAP 7/27: on nasal cannula, reports anxiety and tremors consistent with EtOH withdrawal.  Received a total of 15  mg/kg of phenobarbital , however he remained agitated requiring low dose precedex  gtt.  7/28: Pt resting in bed no agitation on precedex  gtt @0 .5 mcg/min.  Orders placed to transfuse 1 unit of pRBC's hgb 7.2 goal hgb 8.0.  No signs of active bleeding  7/29: Pt remains encephalopathic with severe DT's requiring precedex  gtt.  NGT placed to initiate tube feeds and administer medications  7/30: Earlier this morning with vomiting, concern for aspiration event as now with increased WOB and increasing FiO2 requirements (up to 4L). Remains encephalopathic on Precedex .  Respiratory status continued to decline, progressed to agonal respirations, Required INTUBATION for airway protection.  Hypotensive with induction meds despite IV fluids and Levophed  resulting in brief loss of pulse (ROSC obtained after 2 minutes of CPR and 1 epi).  Central line placed due to vasopressors. 7/31: Overnight with episode on non-sustained SVT which converted spontaneously, remains on Amiodarone  gtt per Cardiology.  Nursing reported black tarry stools overnight, Hgb remains stable.   Levophed  weaned off, on minimal vent settings, will perform WUA and SBT as mental status allows.  With fever of 101 F overnight and WBC nearly doubled, tracheal aspirate results pending, will check blood cultures and UA, broaden to empiric Vancomycin  and Zosyn  pending culture results.   08/01:  No significant events reported overnight. Received 1 unit pRBC's overnight for Hgb 7.5 with appropriate response to 8.8 and has since remained stable with no reports of bleeding from nursing.  Fevers resolved, WBC improved with broadening to Zosyn  yesterday, cultures currently with no growth.  Weaned off levophed .  On minimal vent support, passed WUA and SBT,  EXTUBATED TO HHFNC.  Diuresed with 20 mg IV Lasix  x1 dose with extubation, needs aggressive pulmonary toilet.      Assessment/Plan:   Principal Problem:   Acute respiratory distress Active Problems:   COPD  exacerbation (HCC)   Lobar pneumonia (HCC)   Elevated lactic acid level   Metabolic acidosis, increased anion gap   Essential hypertension   Acute on chronic diastolic CHF (congestive heart failure) (HCC)   Hyperlipidemia   Stroke (HCC)   CAD (coronary artery disease)   Iron  deficiency anemia   Alcohol  abuse   Cognitive impairment   Myocardial injury   Nutrition Problem: Inadequate oral intake Etiology: lethargy/confusion  Signs/Symptoms: NPO status   Body mass index is 29.48 kg/m.  (Obesity)    Acute on chronic hypoxic respiratory failure: Oxygen has been weaned down to 4 L/min via HFNC.  He is no longer on oxygen via HHFNC. Taper down oxygen as able.   S/p extubation on 07/18/2024.  Was intubated on 07/16/2024.   Aspiration pneumonia: Completed IV Zosyn  on 07/21/2024. Leukocytosis: Improved Hypotension: Resolved Suspected septic shock: Resolved.  He is off of IV Levophed  infusion.   Nebulized Mucomyst  has been discontinued   COPD exacerbation: Continue bronchodilators.  Completed steroids.   Acute toxic metabolic encephalopathy: Mental status is improving.  Continue supportive care Alcohol  use disorder with alcohol  withdrawal syndrome: He is off of IV Precedex  infusion.  Continue IV thiamine .   Brief cardiac arrest due to severe hypotension from induction meds on 07/16/2024 (ROSC achieved after 2 minutes of CPR and 1 dose of epinephrine ).    Acute NSTEMI, elevated troponins (peaked at 5,766), CAD, recent elective left heart cath s/p DES to SVG to PDA 07/02/2024,  history of CABG x 3 (May 2013): Continue aspirin , Plavix  and rosuvastatin .   Acute on chronic diastolic CHF, severe aortic stenosis: Patient was given 20 mg IV Lasix  x 1 dose today given recent blood transfusion.  S/p treatment with IV Lasix . 2D echo on 05/10/2024 showed EF estimated at 60 to 65%, grade 1 diastolic dysfunction, severe aortic valve stenosis.   Chronic anemia, history of GI bleed: Hemoglobin  up from 8-8.6.   S/p transfusion with 1 unit of PRBCs on 07/21/2024 Previously transfused 1 unit of PRBCs on 07/14/2024 and 1 unit on 07/17/2024.  No evidence of overt GI bleeding thus far.  Continue IV Protonix .  H&H stable. He was evaluated by gastroenterologist on 07/13/2024.  GI signed off. 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 recommended by speech therapist for now.  Continue enteral nutrition via NG tube MBBS was rescheduled for tomorrow because the patient was found to be unstable this morning.   Mild hypernatremia: Continue free water  via NG tube.  Continue enteral nutrition.  Monitor BMP. AKI: Resolved    Hyperglycemia: Glucose levels are stable.  Hemoglobin A1c was 5.3 on 10/28/2022   Comorbidities include hypertension, hyperlipidemia, history of stroke   Plan discussed with Jon, RN, the bedside  Diet Order             Diet NPO time specified  Diet effective now                            Consultants: Intensivist Gastroenterologist Cardiologist  Procedures: Intubated on 07/16/2024 and extubated on 07/18/2024 Left IJ central line placed on 07/16/2024    Medications:    amiodarone   400  mg Per Tube BID   Followed by   NOREEN ON 07/28/2024] amiodarone   200 mg Per Tube Daily   ascorbic acid   500 mg Per Tube Daily   aspirin   81 mg Per Tube Daily   Chlorhexidine  Gluconate Cloth  6 each Topical Q0600   cholecalciferol   2,000 Units Per Tube Daily   clopidogrel   75 mg Per Tube Daily   docusate  100 mg Per Tube BID   enoxaparin  (LOVENOX ) injection  40 mg Subcutaneous QHS   folic acid   1 mg Per Tube Daily   free water   30 mL Per Tube Q4H   ipratropium-albuterol   3 mL Nebulization Q6H   iron  polysaccharides  150 mg Per Tube Daily   multivitamin with minerals  1 tablet Per Tube Daily   mouth rinse  15 mL Mouth Rinse 4 times per day   pantoprazole  (PROTONIX ) IV   40 mg Intravenous Q12H   polyethylene glycol  17 g Per Tube Daily   rosuvastatin   20 mg Per Tube Daily   thiamine  (VITAMIN B1) injection  100 mg Intravenous Daily   Continuous Infusions:  feeding supplement (PIVOT 1.5 CAL) Stopped (07/22/24 0715)     Anti-infectives (From admission, onward)    Start     Dose/Rate Route Frequency Ordered Stop   07/17/24 1000  piperacillin -tazobactam (ZOSYN ) IVPB 3.375 g  Status:  Discontinued        3.375 g 12.5 mL/hr over 240 Minutes Intravenous Every 8 hours 07/17/24 0811 07/21/24 1100   07/17/24 1000  vancomycin  (VANCOREADY) IVPB 2000 mg/400 mL  Status:  Discontinued        2,000 mg 200 mL/hr over 120 Minutes Intravenous  Once 07/17/24 0811 07/17/24 1026   07/14/24 1200  cefTRIAXone  (ROCEPHIN ) 2 g in sodium chloride  0.9 % 100 mL IVPB        2 g 200 mL/hr over 30 Minutes Intravenous Every 24 hours 07/14/24 1030 07/16/24 1204   07/12/24 1600  piperacillin -tazobactam (ZOSYN ) IVPB 3.375 g  Status:  Discontinued        3.375 g 12.5 mL/hr over 240 Minutes Intravenous Every 8 hours 07/12/24 1503 07/14/24 1030   07/12/24 0900  vancomycin  (VANCOREADY) IVPB 1250 mg/250 mL  Status:  Discontinued        1,250 mg 166.7 mL/hr over 90 Minutes Intravenous Every 12 hours 07/11/24 2012 07/12/24 1458   07/11/24 2100  ceFEPIme  (MAXIPIME ) 2 g in sodium chloride  0.9 % 100 mL IVPB  Status:  Discontinued        2 g 200 mL/hr over 30 Minutes Intravenous Every 8 hours 07/11/24 1959 07/12/24 1458   07/11/24 2100  vancomycin  (VANCOREADY) IVPB 2000 mg/400 mL        2,000 mg 200 mL/hr over 120 Minutes Intravenous  Once 07/11/24 2004 07/11/24 2255   07/11/24 1945  cefTRIAXone  (ROCEPHIN ) 1 g in sodium chloride  0.9 % 100 mL IVPB  Status:  Discontinued        1 g 200 mL/hr over 30 Minutes Intravenous  Once 07/11/24 1930 07/11/24 1933   07/11/24 1945  azithromycin  (ZITHROMAX ) 500 mg in sodium chloride  0.9 % 250 mL IVPB  Status:  Discontinued        500 mg 250 mL/hr over 60  Minutes Intravenous  Once 07/11/24 1930 07/11/24 1955   07/11/24 1945  cefTRIAXone  (ROCEPHIN ) 2 g in sodium chloride  0.9 % 100 mL IVPB  Status:  Discontinued        2 g 200 mL/hr over  30 Minutes Intravenous  Once 07/11/24 1933 07/11/24 1955              Family Communication/Anticipated D/C date and plan/Code Status   DVT prophylaxis: enoxaparin  (LOVENOX ) injection 40 mg Start: 07/15/24 2200 Place and maintain sequential compression device Start: 07/14/24 1044     Code Status: Full Code  Family Communication: None Disposition Plan: Plan to discharge to SNF   Status is: Inpatient Remains inpatient appropriate because: Acute respiratory failure, aspiration pneumonia       Subjective:   Interval events noted.  He reported again that his speech is better.  Jon, RN, was at the bedside.  She said she found the patient without his oxygen this morning.  He was breathless and oxygen saturation went down to the 80s.  He quickly applied oxygen and dyspnea and oxygen saturation improved.  Objective:    Vitals:   07/22/24 0900 07/22/24 1000 07/22/24 1100 07/22/24 1200  BP: 127/78 122/77 (!) 158/84 113/76  Pulse: 98 98 (!) 104 98  Resp: 19 20 (!) 23 (!) 21  Temp:      TempSrc:      SpO2: 98% 94% 91% 96%  Weight:      Height:       No data found.   Intake/Output Summary (Last 24 hours) at 07/22/2024 1304 Last data filed at 07/22/2024 0744 Gross per 24 hour  Intake 1594.33 ml  Output 1150 ml  Net 444.33 ml   Filed Weights   07/20/24 0500 07/21/24 0500 07/22/24 0242  Weight: 99.9 kg 98.6 kg 98.6 kg    Exam:   GEN: NAD SKIN: Warm and dry EYES: No pallor or icterus ENT: MMM CV: RRR PULM: Bilateral rhonchi, bibasilar rales ABD: soft, ND, NT, +BS CNS: AAO x 2 (person and place), non focal EXT: No edema or tenderness       Data Reviewed:   I have personally reviewed following labs and imaging studies:  Labs: Labs show the following:   Basic  Metabolic Panel: Recent Labs  Lab 07/16/24 0334 07/16/24 1608 07/16/24 1611 07/17/24 0222 07/18/24 0450 07/19/24 0559 07/20/24 0531 07/21/24 0455 07/22/24 0416  NA 141  --    < > 143 143 145 147* 145 146*  K 3.2*  --    < > 3.9 3.1* 3.7 4.4 4.0 3.9  CL 104  --    < > 105 108 108 109 105 104  CO2 28  --    < > 27 28 32 29 33* 33*  GLUCOSE 129*  --    < > 129* 140* 167* 178* 193* 186*  BUN 29*  --    < > 34* 24* 23 27* 33* 36*  CREATININE 0.99  --    < > 1.38* 1.04 0.85 1.01 1.15 0.97  CALCIUM  8.1*  --    < > 7.9* 7.8* 8.0* 8.6* 8.4* 8.6*  MG 2.6* 2.4  --  2.6* 2.4 2.4  --   --   --   PHOS 2.7  --   --  3.1 2.4* 3.5 3.1 3.4  --    < > = values in this interval not displayed.   GFR Estimated Creatinine Clearance: 92.4 mL/min (by C-G formula based on SCr of 0.97 mg/dL). Liver Function Tests: Recent Labs  Lab 07/17/24 0222 07/18/24 0450 07/19/24 0559 07/20/24 0531 07/21/24 0455  ALBUMIN 2.9* 2.6* 2.6* 3.0* 2.7*   No results for input(s): LIPASE, AMYLASE in the last 168 hours. No results  for input(s): AMMONIA in the last 168 hours.  Coagulation profile No results for input(s): INR, PROTIME in the last 168 hours.  CBC: Recent Labs  Lab 07/18/24 0450 07/18/24 0621 07/18/24 1157 07/19/24 0559 07/20/24 0531 07/21/24 0455 07/22/24 0416  WBC 17.2*  --   --  14.9* 12.8* 10.5 14.0*  HGB 8.3*   < > 9.1* 8.7* 9.2* 8.0* 8.6*  HCT 27.6*   < > 30.4* 29.6* 31.6* 26.9* 28.3*  MCV 90.2  --   --  91.9 92.9 92.4 91.6  PLT 213  --   --  227 229 211 247   < > = values in this interval not displayed.   Cardiac Enzymes: No results for input(s): CKTOTAL, CKMB, CKMBINDEX, TROPONINI in the last 168 hours. BNP (last 3 results) No results for input(s): PROBNP in the last 8760 hours. CBG: Recent Labs  Lab 07/21/24 1943 07/21/24 2245 07/22/24 0354 07/22/24 0720 07/22/24 1132  GLUCAP 134* 158* 163* 162* 124*   D-Dimer: No results for input(s): DDIMER in the  last 72 hours. Hgb A1c: No results for input(s): HGBA1C in the last 72 hours. Lipid Profile: No results for input(s): CHOL, HDL, LDLCALC, TRIG, CHOLHDL, LDLDIRECT in the last 72 hours.  Thyroid function studies: No results for input(s): TSH, T4TOTAL, T3FREE, THYROIDAB in the last 72 hours.  Invalid input(s): FREET3 Anemia work up: No results for input(s): VITAMINB12, FOLATE, FERRITIN, TIBC, IRON , RETICCTPCT in the last 72 hours. Sepsis Labs: Recent Labs  Lab 07/16/24 1607 07/16/24 1928 07/17/24 0222 07/19/24 0559 07/20/24 0531 07/21/24 0455 07/22/24 0416  WBC  --   --    < > 14.9* 12.8* 10.5 14.0*  LATICACIDVEN 0.8 1.2  --   --   --   --   --    < > = values in this interval not displayed.    Microbiology Recent Results (from the past 240 hours)  Respiratory (~20 pathogens) panel by PCR     Status: None   Collection Time: 07/12/24  2:15 PM   Specimen: Nasopharyngeal Swab; Respiratory  Result Value Ref Range Status   Adenovirus NOT DETECTED NOT DETECTED Final   Coronavirus 229E NOT DETECTED NOT DETECTED Final    Comment: (NOTE) The Coronavirus on the Respiratory Panel, DOES NOT test for the novel  Coronavirus (2019 nCoV)    Coronavirus HKU1 NOT DETECTED NOT DETECTED Final   Coronavirus NL63 NOT DETECTED NOT DETECTED Final   Coronavirus OC43 NOT DETECTED NOT DETECTED Final   Metapneumovirus NOT DETECTED NOT DETECTED Final   Rhinovirus / Enterovirus NOT DETECTED NOT DETECTED Final   Influenza A NOT DETECTED NOT DETECTED Final   Influenza B NOT DETECTED NOT DETECTED Final   Parainfluenza Virus 1 NOT DETECTED NOT DETECTED Final   Parainfluenza Virus 2 NOT DETECTED NOT DETECTED Final   Parainfluenza Virus 3 NOT DETECTED NOT DETECTED Final   Parainfluenza Virus 4 NOT DETECTED NOT DETECTED Final   Respiratory Syncytial Virus NOT DETECTED NOT DETECTED Final   Bordetella pertussis NOT DETECTED NOT DETECTED Final   Bordetella Parapertussis  NOT DETECTED NOT DETECTED Final   Chlamydophila pneumoniae NOT DETECTED NOT DETECTED Final   Mycoplasma pneumoniae NOT DETECTED NOT DETECTED Final    Comment: Performed at [facility], [address], [place], [place] [address]  Culture, Respiratory w Gram Stain     Status: None   Collection Time: 07/16/24  4:02 PM   Specimen: Tracheal Aspirate; Respiratory  Result Value Ref Range Status  Specimen Description   Final    TRACHEAL ASPIRATE Performed at [facility], [address] [place], [place] [address]    Special Requests   Final    NONE Performed at [facility], [address] [place], [place] [address]    Gram Stain   Final    RARE WBC PRESENT, PREDOMINANTLY PMN NO ORGANISMS SEEN    Culture   Final    NO GROWTH 2 DAYS Performed at [facility], [address] [place], [place] [address]    Report Status 07/19/2024 FINAL  Final  MRSA Next Gen by PCR, Nasal     Status: None   Collection Time: 07/17/24  8:26 AM   Specimen: Nasal Mucosa; Nasal Swab  Result Value Ref Range Status   MRSA by PCR Next Gen NOT DETECTED NOT DETECTED Final    Comment: (NOTE) The GeneXpert MRSA Assay (FDA approved for NASAL specimens only), is one component of a comprehensive MRSA colonization surveillance program. It is not intended to diagnose MRSA infection nor to guide or monitor treatment for MRSA infections. Test performance is not FDA approved in patients less than 53 years old. Performed at [facility], [address] [place], [place] [address]   Culture, blood (Routine X 2) w Reflex to ID Panel     Status: None   Collection Time: 07/17/24  9:57 AM   Specimen: BLOOD  Result Value Ref Range Status   Specimen Description BLOOD BLOOD LEFT HAND  Final   Special Requests   Final    BOTTLES DRAWN AEROBIC AND ANAEROBIC Blood Culture adequate volume   Culture   Final    NO GROWTH 5 DAYS Performed at [facility], [address]  [place], [place] [address]    Report Status 07/22/2024 FINAL  Final  Culture, blood (Routine X 2) w Reflex to ID Panel     Status: None   Collection Time: 07/17/24  9:57 AM   Specimen: BLOOD  Result Value Ref Range Status   Specimen Description BLOOD BLOOD LEFT WRIST  Final   Special Requests   Final    BOTTLES DRAWN AEROBIC AND ANAEROBIC Blood Culture adequate volume   Culture   Final    NO GROWTH 5 DAYS Performed at [facility], [address] [place], [place] [address]    Report Status 07/22/2024 FINAL  Final    Procedures and diagnostic studies:  No results found.              LOS: 11 days   Acasia Skilton  [employer] loss adjuster on [email or web address]. If 7PM-7AM, please contact night-coverage at [email or web address]     07/22/2024, 1:04 PM

## 2024-07-22 NOTE — Progress Notes (Signed)
 Occupational Therapy Treatment Patient Details Name: [name] MRN: [id number] DOB: [birth year] Today's Date: 07/22/2024   History of present illness 63 year old male patient admitted to the ICU with severe alcohol  withdrawal Dts requiring multiple doses of Phenobarb and eventually precedex  drip. Unfortunately course complicated by an aspriation event requiring intubation and mechanical ventilation with course c/b peri intubation brief cardiac arrest requiring one round of CPR.  Pt ultimately needing to be extubated ~24 hours.   OT comments  Pt seen for OT treatment on this date. Upon arrival to room pt seated in recliner, agreeable to tx. Pt requires CGA for STS from slightly elevated recliner seat with heavy multimodal cues for technique and DME management. Pt took ~4 steps from recliner to bed with use of RW, MINA for DME and sequencing throughout amb. Pt required CGA for return to supine. RN in room to assess oxygen as pt desat to 85% on 3L HFNC during mobility, requiring a few minutes to recover back to <90%. Pt making good progress toward goals, will continue to follow POC. Discharge recommendation remains appropriate.        If plan is discharge home, recommend the following:  A lot of help with walking and/or transfers;A lot of help with bathing/dressing/bathroom;Assistance with cooking/housework;Direct supervision/assist for medications management;Assist for transportation   Equipment Recommendations  None recommended by OT    Recommendations for Other Services      Precautions / Restrictions Precautions Precautions: Fall Recall of Precautions/Restrictions: Intact Restrictions Weight Bearing Restrictions Per Provider Order: No       Mobility Bed Mobility Overal bed mobility: Needs Assistance Bed Mobility: Sit to Supine     Supine to sit: Contact guard     General bed mobility comments: No physical assistance to return to supine, extra time needed to complete.     Transfers Overall transfer level: Needs assistance Equipment used: Rolling walker (2 wheels) Transfers: Sit to/from Stand Sit to Stand: Contact guard assist           General transfer comment: CGA for STS from elevated recliner seat, verbal cues for sequencing and hand placement     Balance Overall balance assessment: Needs assistance Sitting-balance support: Feet supported Sitting balance-Leahy Scale: Good Sitting balance - Comments: Improved sitting tolerance, maintaining static sitting with single UE support   Standing balance support: Bilateral upper extremity supported, During functional activity, Reliant on assistive device for balance Standing balance-Leahy Scale: Fair Standing balance comment: Heavy reliant on RW with BilUE during amb                           ADL either performed or assessed with clinical judgement   ADL Overall ADL's : Needs assistance/impaired Eating/Feeding: NPO   Grooming: Wash/dry face;Minimal assistance;Sitting               Lower Body Dressing: Bed level;Maximal assistance   Toilet Transfer: Ambulation;Rolling walker (2 wheels);BSC/3in1;Contact guard assist Toilet Transfer Details (indicate cue type and reason): Simulated from recliner <> EOB         Functional mobility during ADLs: Rolling walker (2 wheels);Minimal assistance General ADL Comments: MINA for sequencing with DME     Communication Communication Communication: No apparent difficulties   Cognition Arousal: Alert Behavior During Therapy: WFL for tasks assessed/performed Cognition: Cognition impaired   Orientation impairments: Situation, Time Awareness: Intellectual awareness impaired, Online awareness impaired Memory impairment (select all impairments): Short-term memory   Executive functioning impairment (select all impairments):  Reasoning, Problem solving OT - Cognition Comments: oriented to self and location                 Following  commands: Intact Following commands impaired: Follows one step commands with increased time      Cueing   Cueing Techniques: Verbal cues, Gestural cues, Tactile cues  Exercises Exercises: Other exercises Other Exercises Other Exercises: Edu: Role of OT session, safe ADL completion, sequencing of transfers           General Comments Pt on 3L HFNC throughout session, desat to 85% during mobility, takes ~64mins to recover <90% on 3L HFNC    Pertinent Vitals/ Pain       Pain Assessment Pain Assessment: No/denies pain                                                          Frequency  Min 2X/week        Progress Toward Goals  OT Goals(current goals can now be found in the care plan section)  Progress towards OT goals: Progressing toward goals  Acute Rehab OT Goals OT Goal Formulation: With patient Time For Goal Achievement: 08/02/24 Potential to Achieve Goals: Good ADL Goals Pt Will Perform Lower Body Bathing: with min assist Pt Will Perform Lower Body Dressing: with min assist Pt Will Transfer to Toilet: with min assist Additional ADL Goal #1: Pt will participate in functional cognitve assessment to assist with improved IADL mgmt at home   AM-PAC OT 6 Clicks Daily Activity     Outcome Measure   Help from another person eating meals?: None Help from another person taking care of personal grooming?: A Little Help from another person toileting, which includes using toliet, bedpan, or urinal?: A Lot Help from another person bathing (including washing, rinsing, drying)?: A Lot Help from another person to put on and taking off regular upper body clothing?: A Little Help from another person to put on and taking off regular lower body clothing?: A Lot 6 Click Score: 16    End of Session Equipment Utilized During Treatment: Oxygen;Gait belt;Rolling walker (2 wheels)  OT Visit Diagnosis: Muscle weakness (generalized) (M62.81);History of falling  (Z91.81)   Activity Tolerance Patient tolerated treatment well   Patient Left in bed;with call bell/phone within reach;with bed alarm set;with nursing/sitter in room   Nurse Communication Mobility status        Time: 8443-8382 OT Time Calculation (min): 21 min  Charges: OT General Charges $OT Visit: 1 Visit OT Treatments $Therapeutic Activity: 8-22 mins  Larraine Colas M.S. OTR/L  07/22/24, 4:43 PM

## 2024-07-22 NOTE — Progress Notes (Signed)
 NGT confirmed in correct placement per xray; tube feeds restarted as previously. Water  flushed increased from 30 cc q 4 to 100 cc q 4. Meds given via NGT. Abdomen remains slightly distended, although appears less than earlier today. Pt has been OOB to chair today and tolerated well. O2 sats do decrease to 80's with activity and takes a few minutes to recover. O2 in place and currently at 3LPM via [place]. Pt denies pain at this time. Breathing continues to sound congested; received Lasix  20 mg IV earlier today, output 600 cc (plus some leakage from external catheter since that dose. Plan for swallow study tomorrow.

## 2024-07-22 NOTE — Progress Notes (Signed)
 Physical Therapy Treatment Patient Details Name: [name] MRN: [id number] DOB: Jul 30, 1961 Today's Date: 07/22/2024   History of Present Illness 63 year old male patient admitted to the ICU with severe alcohol  withdrawal Dts requiring multiple doses of Phenobarb and eventually precedex  drip. Unfortunately course complicated by an aspriation event requiring intubation and mechanical ventilation with course c/b peri intubation brief cardiac arrest requiring one round of CPR.  Pt ultimately needing to be extubated ~24 hours.    PT Comments  Pt was long sitting in bed upon arrival. He is A + O and agreeable to session. Pt remains cooperative and pleasant throughout. He is eager for OOB activity requesting to use BSC for BM. Pt require minimal physical assistance to achieve EOB sitting on 4 L HFNC (green tubing). Sat EOB for ~ 2 minutes due to sao2 dropping to 89%. Pt did drop to 81% sao2 when ambulating only 57ft. Increased HFNC to 10L to allow for pt to recover (>88%) in which took ~ 4 minutes of seated relaxation/breathing exercises.  Eventually able to wean O2 back to 4L HFNC (~6-8 minutes later) with prolonged seated rest. PT will continue to progress pt per current POC. Pt may progress to [place] directly home if O2 status improves however currently DC recs remain appropriate to maximize his independence and safety with all ADLs. He will benefit form continued skilled PT to maximize safety and independence with all ADLs. RN made aware of small/loose BM during session.    If plan is discharge home, recommend the following: A little help with walking and/or transfers;A lot of help with bathing/dressing/bathroom;Assistance with cooking/housework;Assist for transportation;Help with stairs or ramp for entrance     Equipment Recommendations  Rolling walker (2 wheels)       Precautions / Restrictions Precautions Precautions: Fall Recall of Precautions/Restrictions: Intact Restrictions Weight Bearing  Restrictions Per Provider Order: No     Mobility  Bed Mobility Overal bed mobility: Needs Assistance Bed Mobility: Supine to Sit  Supine to sit: Min assist  General bed mobility comments: Min of one to exit R side of bed. sat EOB x ~ 2 minutes priro to transferring to [facility] for BM. pt was on 3 L HFNC at start of session. sao2 dropped to 89% with sitting up EOB that resolves to > 96% after 2 minutes    Transfers Overall transfer level: Needs assistance Equipment used: Rolling walker (2 wheels) Transfers: Sit to/from Stand Sit to Stand: Min assist  General transfer comment: Min assist to stand from lowest bed height + BSC  1 x each    Ambulation/Gait Ambulation/Gait assistance: Min assist, Contact guard assist Gait Distance (Feet): 8 Feet Assistive device: Rolling walker (2 wheels) Gait Pattern/deviations: Step-to pattern, Trunk flexed  General Gait Details: Pt sao2 dropped to 81% while on 4 L HFNC. He required increased O2 demand to 10L HFNC for ~ 4 minuutes prior to sao2 recoverying to > 88%. was able to wean back to 4 L HFNC after 4 minutes seated rest   Balance Overall balance assessment: Needs assistance Sitting-balance support: Feet supported Sitting balance-Leahy Scale: Good     Standing balance support: Bilateral upper extremity supported, During functional activity, Reliant on assistive device for balance Standing balance-Leahy Scale: Fair       Hotel manager: No apparent difficulties  Cognition Arousal: Alert Behavior During Therapy: WFL for tasks assessed/performed   PT - Cognitive impairments: No apparent impairments    PT - Cognition Comments: Pt is A and O. Cooperative  and motivated. Greatly limited by respiratory response to activity Following commands: Intact      Cueing Cueing Techniques: Verbal cues, Gestural cues, Tactile cues     General Comments General comments (skin integrity, edema, etc.): discussed need to continue to  importance respiratory status and safe functional mobility prior to [place] home. Chartered loss adjuster discussed possibility of changing recs to home with HHPT if O2 supply and pt's overall activity tolerance improves      Pertinent Vitals/Pain Pain Assessment Pain Assessment: No/denies pain Pain Score: 0-No pain     PT Goals (current goals can now be found in the care plan section) Acute Rehab PT Goals Patient Stated Goal: get stronger Progress towards PT goals: Progressing toward goals    Frequency    Min 2X/week       AM-PAC PT 6 Clicks Mobility   Outcome Measure  Help needed turning from your back to your side while in a flat bed without using bedrails?: A Little Help needed moving from lying on your back to sitting on the side of a flat bed without using bedrails?: A Little Help needed moving to and from a bed to a chair (including a wheelchair)?: A Little Help needed standing up from a chair using your arms (e.g., wheelchair or bedside chair)?: A Lot Help needed to walk in hospital room?: A Lot Help needed climbing 3-5 steps with a railing? : A Lot 6 Click Score: 15    End of Session Equipment Utilized During Treatment: Oxygen (4L HFNC) Activity Tolerance: Patient tolerated treatment well;Treatment limited secondary to medical complications (Comment) (limited by pt desaturation to low 80s sao2 with minimal activity) Patient left: with chair alarm set;with call bell/phone within reach;with nursing/sitter in room Nurse Communication: Mobility status PT Visit Diagnosis: Muscle weakness (generalized) (M62.81);Difficulty in walking, not elsewhere classified (R26.2);Unsteadiness on feet (R26.81)     Time: 8573-8547 PT Time Calculation (min) (ACUTE ONLY): 26 min  Charges:    $Therapeutic Activity: 23-37 mins PT General Charges $$ ACUTE PT VISIT: 1 Visit                    Rankin Essex PTA 07/22/24, 4:30 PM

## 2024-07-22 NOTE — Plan of Care (Signed)
  Problem: Clinical Measurements: Goal: Ability to maintain clinical measurements within normal limits will improve Outcome: Progressing Goal: Will remain free from infection Outcome: Progressing Goal: Respiratory complications will improve Outcome: Progressing Goal: Cardiovascular complication will be avoided Outcome: Progressing   Problem: Activity: Goal: Risk for activity intolerance will decrease Outcome: Progressing   Problem: Nutrition: Goal: Adequate nutrition will be maintained Outcome: Progressing   Problem: Pain Managment: Goal: General experience of comfort will improve and/or be controlled Outcome: Progressing   Problem: Skin Integrity: Goal: Risk for impaired skin integrity will decrease Outcome: Progressing   Problem: Activity: Goal: Ability to tolerate increased activity will improve Outcome: Progressing   Problem: Respiratory: Goal: Ability to maintain a clear airway will improve Outcome: Progressing Goal: Levels of oxygenation will improve Outcome: Progressing Goal: Ability to maintain adequate ventilation will improve Outcome: Progressing   Problem: Activity: Goal: Ability to tolerate increased activity will improve Outcome: Progressing

## 2024-07-22 NOTE — Progress Notes (Signed)
 SLP Cancellation Note  Patient Details Name: [name] MRN: [id number] DOB: 12/16/1961   Cancelled treatment:       Reason Eval/Treat Not Completed: Medical issues which prohibited therapy  Transportation dispatched to get pt for MBSS. Per transport, nursing reports that pt is not stable enough for transport. Will re-attempt MBSS when pt is appropriate.   [name], M.S., CCC-SLP, CBIS Speech-Language Pathologist Certified Brain Injury Specialist [facility] [phone] Ascom [phone] Fax [phone]   [name] 07/22/2024, 8:02 AM

## 2024-07-22 NOTE — Progress Notes (Addendum)
 Tube feeds turned off by previous shift nurse around 0715 due to plan for Barium swallow. On assessment, pt also noted to have tachypnea, C/O SOB,  dyspnea at rest with O2 sat 87-88. O2 noted to have become disconnected from flowmeter. After O2 reconnected, and sats returned to mid 90's, pt had partial improvement in symptoms. However, pt also noted to have wheezes throughout all lung fields with rhonci and crackles and congested nonproductive cough with loud audible congested breathing. Swallow study postponed for now due to above symptoms. RT in to administer nebulizer treatment with improvement of wheezing, but otherwise unchanged. Dr. Jens updated and orders received for IV lasix . Pt was also complaining of 8/10 abd pain; morphine  2 mg given with partial relief of pain and SOB. Feeding tube also noted to be at 50 cm, and moving freely within loose tape. Abdomen slightly distended, with hyperactive bowel sounds and pt does complain of discomfort. Tube advanced and resecured at 70 cm, documented marking at time of insertion. Xrays ordered.

## 2024-07-23 ENCOUNTER — Inpatient Hospital Stay

## 2024-07-23 DIAGNOSIS — R0603 Acute respiratory distress: Secondary | ICD-10-CM | POA: Diagnosis not present

## 2024-07-23 DIAGNOSIS — R7989 Other specified abnormal findings of blood chemistry: Secondary | ICD-10-CM | POA: Diagnosis not present

## 2024-07-23 DIAGNOSIS — J441 Chronic obstructive pulmonary disease with (acute) exacerbation: Secondary | ICD-10-CM | POA: Diagnosis not present

## 2024-07-23 DIAGNOSIS — J181 Lobar pneumonia, unspecified organism: Secondary | ICD-10-CM | POA: Diagnosis not present

## 2024-07-23 DIAGNOSIS — I25810 Atherosclerosis of coronary artery bypass graft(s) without angina pectoris: Secondary | ICD-10-CM

## 2024-07-23 LAB — CBC
HCT: 31 % — ABNORMAL LOW (ref 39.0–52.0)
Hemoglobin: 9.3 g/dL — ABNORMAL LOW (ref 13.0–17.0)
MCH: 27.8 pg (ref 26.0–34.0)
MCHC: 30 g/dL (ref 30.0–36.0)
MCV: 92.5 fL (ref 80.0–100.0)
Platelets: 247 K/uL (ref 150–400)
RBC: 3.35 MIL/uL — ABNORMAL LOW (ref 4.22–5.81)
RDW: 17.2 % — ABNORMAL HIGH (ref 11.5–15.5)
WBC: 18.8 K/uL — ABNORMAL HIGH (ref 4.0–10.5)
nRBC: 0 % (ref 0.0–0.2)

## 2024-07-23 LAB — BASIC METABOLIC PANEL WITH GFR
Anion gap: 9 (ref 5–15)
BUN: 37 mg/dL — ABNORMAL HIGH (ref 8–23)
CO2: 32 mmol/L (ref 22–32)
Calcium: 8.6 mg/dL — ABNORMAL LOW (ref 8.9–10.3)
Chloride: 101 mmol/L (ref 98–111)
Creatinine, Ser: 1.12 mg/dL (ref 0.61–1.24)
GFR, Estimated: >60 mL/min (ref >60)
Glucose, Bld: 138 mg/dL — ABNORMAL HIGH (ref 70–99)
Potassium: 4.1 mmol/L (ref 3.5–5.1)
Sodium: 142 mmol/L (ref 135–145)

## 2024-07-23 LAB — GLUCOSE, CAPILLARY
Glucose-Capillary: 118 mg/dL — ABNORMAL HIGH (ref 70–99)
Glucose-Capillary: 121 mg/dL — ABNORMAL HIGH (ref 70–99)
Glucose-Capillary: 118 mg/dL — ABNORMAL HIGH (ref 70–99)
Glucose-Capillary: 103 mg/dL — ABNORMAL HIGH (ref 70–99)
Glucose-Capillary: 115 mg/dL — ABNORMAL HIGH (ref 70–99)

## 2024-07-23 LAB — MAGNESIUM: Magnesium: 2.7 mg/dL — ABNORMAL HIGH (ref 1.7–2.4)

## 2024-07-23 LAB — PROCALCITONIN: Procalcitonin: 0.26 ng/mL

## 2024-07-23 LAB — PHOSPHORUS: Phosphorus: 4.6 mg/dL (ref 2.5–4.6)

## 2024-07-23 MED ORDER — THIAMINE MONONITRATE 100 MG PO TABS
100.0000 mg | ORAL_TABLET | Freq: Every day | ORAL | Status: DC
  Administered 2024-07-23: 100 mg
  Filled 2024-07-23: qty 1

## 2024-07-23 MED ORDER — FUROSEMIDE 10 MG/ML IJ SOLN
40.0000 mg | Freq: Once | INTRAMUSCULAR | Status: AC
  Administered 2024-07-23: 40 mg via INTRAVENOUS
  Filled 2024-07-23: qty 4

## 2024-07-23 MED ORDER — SODIUM CHLORIDE 0.9 % IV SOLN: INTRAVENOUS | Status: AC | PRN

## 2024-07-23 MED ORDER — ACETYLCYSTEINE 20 % IN SOLN: 70.0000 mg/kg | Freq: Three times a day (TID) | RESPIRATORY_TRACT | Status: DC

## 2024-07-23 MED ORDER — SODIUM CHLORIDE 0.9 % IV SOLN
2.0000 g | Freq: Three times a day (TID) | INTRAVENOUS | Status: DC
  Administered 2024-07-23 – 2024-07-24 (×3): 2 g via INTRAVENOUS
  Filled 2024-07-23 (×4): qty 12.5

## 2024-07-23 MED ORDER — ACETYLCYSTEINE 20 % IN SOLN
4.0000 mL | Freq: Three times a day (TID) | RESPIRATORY_TRACT | Status: DC
  Administered 2024-07-23 – 2024-07-24 (×3): 4 mL via RESPIRATORY_TRACT
  Filled 2024-07-23 (×3): qty 4

## 2024-07-23 MED ORDER — PHENOL 1.4 % MT LIQD
1.0000 | OROMUCOSAL | Status: DC | PRN
  Administered 2024-07-23 – 2024-07-27 (×5): 1 via OROMUCOSAL
  Filled 2024-07-23: qty 177

## 2024-07-23 MED ORDER — IPRATROPIUM-ALBUTEROL 0.5-2.5 (3) MG/3ML IN SOLN
3.0000 mL | RESPIRATORY_TRACT | Status: DC
  Administered 2024-07-23 – 2024-07-25 (×14): 3 mL via RESPIRATORY_TRACT
  Filled 2024-07-23 (×14): qty 3

## 2024-07-23 NOTE — Progress Notes (Addendum)
 [facility] CLINIC CARDIOLOGY PROGRESS NOTE       Patient ID: [name] MRN: [id number] DOB/AGE: [id number] 65 y.o.  Admit date: 07/11/2024 Referring Physician Dr. Charlie Sellar Primary Physician Osa Geralds, NP  Primary Cardiologist Tinnie Maiden, NP  Reason for Consultation elevated troponins  HPI: [name] is a 63 y.o. 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 Found to have pneumonia, overnight troponins uptrending. Cardiology was consulted for further evaluation.   Interval history: -Patient seen and examined this AM, resting in bed. Patient states SOB feels worse today. Patient has increased O2 requirement to 10 L.  -Patient BP and HR stable this AM -Per tele no more evidence of ventricular ectopy, in SR rate 90s. -UOP 1.1L yesterday s/p IV lasix  20 mg x1. Renal function remains stable. CO2 32.  -Patient remains NPO. NGT in place -Worsening leukocytosis at 18.8. Patient started on ABX  Review of systems complete and found to be negative unless listed above    Past Medical History:  Diagnosis Date   COPD (chronic obstructive pulmonary disease) (HCC)    CVA (cerebral infarction)    Headache    mild, since stroke 2012   Hypertension    MI (myocardial infarction) (HCC) 05/09/2012   Reading difficulty    pt reports he reads at about a second grade level   Stroke [facility]) 2012   numbness to left hand    Wears dentures    full upper and lower    Past Surgical History:  Procedure Laterality Date   CAROTID ENDARTERECTOMY Left    2012 or 2013   CHOLECYSTECTOMY N/A 07/26/2016   Procedure: LAPAROSCOPIC CHOLECYSTECTOMY;  Surgeon:  Charlie FORBES Fell, MD;  Location: [facility] ORS;  Service: General;  Laterality: N/A;   COLONOSCOPY N/A 05/08/2024   Procedure: COLONOSCOPY;  Surgeon: Jinny Carmine, MD;  Location: [facility] ENDOSCOPY;  Service: Endoscopy;  Laterality: N/A;   COLONOSCOPY WITH PROPOFOL  N/A 02/09/2022   Procedure: COLONOSCOPY WITH PROPOFOL ;  Surgeon: Jinny Carmine, MD;  Location: [facility] ENDOSCOPY;  Service: Endoscopy;  Laterality: N/A;   CORONARY ARTERY BYPASS GRAFT  05/10/2012   3 vessel   CORONARY STENT INTERVENTION N/A 07/02/2024   Procedure: CORONARY STENT INTERVENTION;  Surgeon: Katina Albright, MD;  Location: [facility] INVASIVE CV LAB;  Service: Cardiovascular;  Laterality: N/A;   CORONARY ULTRASOUND/IVUS N/A 07/02/2024   Procedure: Coronary Ultrasound/IVUS;  Surgeon: Katina Albright, MD;  Location: [facility] INVASIVE CV LAB;  Service: Cardiovascular;  Laterality: N/A;   ENTEROSCOPY N/A 05/16/2023   Procedure: ENTEROSCOPY;  Surgeon: Therisa Bi, MD;  Location: [facility] ENDOSCOPY;  Service: Gastroenterology;  Laterality: N/A;   ENTEROSCOPY N/A 06/01/2023   Procedure: ENTEROSCOPY;  Surgeon: Unk Corinn Skiff, MD;  Location: [facility] ENDOSCOPY;  Service: Gastroenterology;  Laterality: N/A;   ESOPHAGOGASTRODUODENOSCOPY  05/13/2023   Procedure: ESOPHAGOGASTRODUODENOSCOPY (EGD);  Surgeon: Jinny Carmine, MD;  Location: [facility] ENDOSCOPY;  Service: Endoscopy;;   ESOPHAGOGASTRODUODENOSCOPY (EGD) WITH PROPOFOL  N/A 10/28/2022   Procedure: ESOPHAGOGASTRODUODENOSCOPY (EGD) WITH PROPOFOL ;  Surgeon: Onita Elspeth Sharper, DO;  Location: [facility] ENDOSCOPY;  Service: Gastroenterology;  Laterality: N/A;   GIVENS CAPSULE STUDY  05/13/2023   Procedure: GIVENS CAPSULE STUDY;  Surgeon: Jinny Carmine, MD;  Location:  [facility] ENDOSCOPY;  Service: Endoscopy;;   GIVENS CAPSULE STUDY  06/01/2023   Procedure: GIVENS CAPSULE STUDY;  Surgeon: Unk Corinn Skiff, MD;  Location: [facility] ENDOSCOPY;  Service: Gastroenterology;;   LEFT HEART CATH AND CORS/GRAFTS ANGIOGRAPHY N/A 07/02/2024   Procedure:  LEFT HEART CATH AND CORS/GRAFTS ANGIOGRAPHY;  Surgeon: Katina Albright, MD;  Location: [facility] INVASIVE CV LAB;  Service: Cardiovascular;  Laterality: N/A;    Medications Prior to Admission  Medication Sig Dispense Refill Last Dose/Taking   albuterol  (VENTOLIN  HFA) 108 (90 Base) MCG/ACT inhaler Inhale 2 puffs into the lungs every 6 (six) hours as needed for wheezing or shortness of breath. 6.7 g 2 07/11/2024 Morning   ascorbic acid  (VITAMIN C ) 500 MG tablet Take 1 tablet (500 mg total) by mouth daily. 30 tablet 2 07/11/2024 Morning   aspirin  EC 81 MG tablet Take 1 tablet (81 mg total) by mouth daily. Swallow whole. 30 tablet 5 07/11/2024 Morning   clopidogrel  (PLAVIX ) 75 MG tablet Take 1 tablet (75 mg total) by mouth daily with breakfast. 30 tablet 11 07/11/2024 Morning   dapagliflozin  propanediol (FARXIGA ) 10 MG TABS tablet Take 1 tablet (10 mg total) by mouth daily. 30 tablet 11 07/11/2024 Morning   folic acid  (FOLVITE ) 1 MG tablet Take 1 tablet (1 mg total) by mouth daily. 30 tablet 2 07/11/2024   iron  polysaccharides (NIFEREX) 150 MG capsule Take 1 capsule (150 mg total) by mouth daily. 30 capsule 2 07/11/2024   isosorbide  mononitrate (IMDUR ) 60 MG 24 hr tablet Take 1 tablet (60 mg total) by mouth daily. 30 tablet 11 07/11/2024   losartan  (COZAAR ) 25 MG tablet Take 0.5 tablets (12.5 mg total) by mouth daily. 30 tablet 11 07/11/2024 Morning   metoprolol  succinate (TOPROL -XL) 25 MG 24 hr tablet Take 0.5 tablets (12.5 mg total) by mouth daily. 30 tablet 11 07/11/2024 Morning   pantoprazole  (PROTONIX ) 40 MG tablet Take 1 tablet (40 mg total) by mouth daily. 30 tablet 2 07/11/2024 Morning   pravastatin  (PRAVACHOL ) 20 MG tablet Take 1 tablet (20 mg total) by mouth at bedtime. 30 tablet 11 07/10/2024 Evening   spironolactone  (ALDACTONE ) 25 MG tablet Take 0.5 tablets (12.5 mg total) by mouth daily. 30 tablet 11 07/11/2024 Morning   torsemide  (DEMADEX ) 10 MG tablet Take 1 tablet (10 mg total) by mouth daily. 30 tablet 11  07/11/2024   Social History   Socioeconomic History   Marital status: Divorced    Spouse name: Not on file   Number of children: Not on file   Years of education: Not on file   Highest education level: Not on file  Occupational History   Not on file  Tobacco Use   Smoking status: Former    Current packs/day: 0.00    Types: Cigarettes    Quit date: 59    Years since quitting: 32.6   Smokeless tobacco: Current    Types: Snuff   Tobacco comments:    changed to dip  Vaping Use   Vaping status: Never Used  Substance and Sexual Activity   Alcohol  use: Yes    Alcohol /week: 26.0 standard drinks of alcohol     Types: 26 Cans of beer per week   Drug use: No   Sexual activity: Yes    Birth control/protection: None  Other Topics Concern   Not on file  Social History Narrative   Not on file   Social Drivers of Health   Financial Resource Strain: Not on file  Food Insecurity: No Food Insecurity (07/12/2024)  Hunger Vital Sign    Worried About Running Out of Food in the Last Year: Never true    Ran Out of Food in the Last Year: Never true  Transportation Needs: No Transportation Needs (07/12/2024)   PRAPARE - Administrator, Civil Service (Medical): No    Lack of Transportation (Non-Medical): No  Physical Activity: Not on file  Stress: Not on file  Social Connections: Socially Isolated (07/12/2024)   Social Connection and Isolation Panel    Frequency of Communication with Friends and Family: Once a week    Frequency of Social Gatherings with Friends and Family: Once a week    Attends Religious Services: Never    Database administrator or Organizations: No    Attends Banker Meetings: Never    Marital Status: Divorced  Catering manager Violence: Not At Risk (07/12/2024)   Humiliation, Afraid, Rape, and Kick questionnaire    Fear of Current or Ex-Partner: No    Emotionally Abused: No    Physically Abused: No    Sexually Abused: No    Family History   Problem Relation Age of Onset   Pneumonia Mother      Vitals:   07/23/24 0939 07/23/24 1000 07/23/24 1100 07/23/24 1109  BP:  135/82 133/77   Pulse: (!) 102 94 93   Resp: (!) 25 (!) 23 19   Temp:      TempSrc:      SpO2: 96% 100% 100% 100%  Weight:      Height:        PHYSICAL EXAM General: Ill appearing male, no acute distress. HEENT: Normocephalic and atraumatic. Neck: No JVD.  Lungs: Bilateral rhonchi. Heart: HRRR, Normal S1 and S2 without gallops. +systolic ejection murmur.   Abdomen: Non-distended appearing.  Msk: Normal strength and tone for age. Extremities: Warm and well perfused. No clubbing, cyanosis, edema.   Labs: Basic Metabolic Panel: Recent Labs    07/21/24 0455 07/22/24 0416 07/23/24 0457  NA 145 146* 142  K 4.0 3.9 4.1  CL 105 104 101  CO2 33* 33* 32  GLUCOSE 193* 186* 138*  BUN 33* 36* 37*  CREATININE 1.15 0.97 1.12  CALCIUM  8.4* 8.6* 8.6*  MG  --   --  2.7*  PHOS 3.4  --  4.6   Liver Function Tests: Recent Labs    07/21/24 0455  ALBUMIN 2.7*   No results for input(s): LIPASE, AMYLASE in the last 72 hours. CBC: Recent Labs    07/22/24 0416 07/23/24 0457  WBC 14.0* 18.8*  HGB 8.6* 9.3*  HCT 28.3* 31.0*  MCV 91.6 92.5  PLT 247 247   Cardiac Enzymes: No results for input(s): CKTOTAL, CKMB, CKMBINDEX, TROPONINIHS in the last 72 hours.   BNP: No results for input(s): BNP in the last 72 hours.  D-Dimer: No results for input(s): DDIMER in the last 72 hours. Hemoglobin A1C: No results for input(s): HGBA1C in the last 72 hours. Fasting Lipid Panel: No results for input(s): CHOL, HDL, LDLCALC, TRIG, CHOLHDL, LDLDIRECT in the last 72 hours.  Thyroid Function Tests: No results for input(s): TSH, T4TOTAL, T3FREE, THYROIDAB in the last 72 hours.  Invalid input(s): FREET3 Anemia Panel: No results for input(s): VITAMINB12, FOLATE, FERRITIN, TIBC, IRON , RETICCTPCT in the last 72  hours.   Radiology: DG ABD ACUTE 2+V W 1V CHEST Result Date: 07/22/2024 CLINICAL DATA:  [id number] Dyspnea [id number] [phone] Encounter for imaging study to confirm nasogastric (NG) tube placement [id number] EXAM: DG ABDOMEN  ACUTE WITH 1 VIEW CHEST COMPARISON:  Chest x-ray 07/19/2024 FINDINGS: Enteric tube courses below the diaphragm with tip and side port overlying the expected region of the gastric lumen. The heart and mediastinal contours are within normal limits. No focal consolidation. Chronic coarsened interstitial markings with no overt pulmonary edema. Bilateral trace pleural effusion. No pneumothorax. Right upper quadrant surgical clips. There is no evidence of dilated bowel loops or free intraperitoneal air. No radiopaque calculi or other significant radiographic abnormality is seen. No acute osseous abnormality.  Intact sternotomy wires. IMPRESSION: 1. Enteric tube in good position. 2. Bowel trace pleural effusions. 3. Nonobstructive bowel gas pattern. Electronically Signed   By: Morgane  Naveau M.D.   On: 07/22/2024 14:18   DG Chest Port 1 View Result Date: 07/19/2024 CLINICAL DATA:  Dyspnea EXAM: PORTABLE CHEST 1 VIEW COMPARISON:  07/18/2024 FINDINGS: Two frontal views of the chest demonstrate an enteric catheter passing below diaphragm, tip excluded by collimation. Left internal jugular catheter tip overlies superior vena cava unchanged. 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 The cardiac silhouette is stable. Mild central vascular congestion, with a trace right pleural effusion unchanged since prior exam. The nodular density projecting over the right upper lateral chest on prior study is less well seen on this exam. This may be due to positioning. No pneumothorax. No acute bony abnormalities. IMPRESSION: 1. Pulmonary vascular congestion and small right pleural effusion, unchanged since prior study. 2. The nodular consolidation within the right upper lateral chest seen on prior x-ray and CT is not well seen on  this exam, likely due to patient positioning. Electronically Signed   By: Ozell Daring M.D.   On: 07/19/2024 17:34   DG Chest Port 1 View Result Date: 07/18/2024 EXAM: 1 VIEW XRAY OF THE CHEST 07/18/2024 04:59:06 AM COMPARISON: 07/16/2024 CLINICAL HISTORY: [id number] Acute respiratory failure with hypoxia (HCC) 427266. Acute respiratory failure with hypoxia FINDINGS: LUNGS AND PLEURA: Nodular opacity within the periphery of the right upper lobe appears similar to CT from 07/11/2024. Please refer to that report for follow-up recommendations. Mild asymmetric hazy opacification over the right mid and right lower lung may be related to positional artifact versus small posterior layering effusion. Mild subsegmental atelectasis in the left base. HEART AND MEDIASTINUM: Stable cardiomediastinal contours. Previous median sternotomy and CABG procedure. BONES AND SOFT TISSUES: Aortic atherosclerotic calcification. Left IJ catheter is identified with the tip in the projection of the SVC. There is an enteric tube which courses below the field of view. ET tube tip is stable above the carina. IMPRESSION: 1. Stable nodular opacity within the periphery of the right upper lobe, similar to CT from 07/11/2024. Please refer to that report for follow-up recommendations. 2. Mild asymmetric hazy opacification over the right mid and right lower lung, possibly related to rotational artifact versus small posterior layering effusion. 3. Mild subsegmental atelectasis in the left base. Electronically signed by: Waddell Calk MD 07/18/2024 06:51 AM EDT RP Workstation: [id number]   DG Chest Port 1 View Result Date: 07/16/2024 CLINICAL DATA:  Endotracheal tube and central line placement. EXAM: PORTABLE CHEST 1 VIEW COMPARISON:  07/16/2024 at 8:58 a.m. FINDINGS: Patient is rotated to the right. Endotracheal tube has tip proximally 13.5 cm above the carina. This could be advanced approximately 6 cm. Left IJ central venous catheter has tip over the  SVC. Enteric tube courses into the region of the stomach and off the image as tip is not visualized. Lungs are adequately inflated with possible mild hazy opacification over the right upper  lung abutting the minor fissure which may be due to atelectasis or infection. Left lung is clear. Cardiomediastinal silhouette and remainder of the exam is unchanged. IMPRESSION: 1. Endotracheal tube has tip proximally 13.5 cm above the carina. This could be advanced approximately 6 cm. 2. Left IJ central venous catheter with tip over the SVC. 3. Possible mild hazy opacification over the right upper lung abutting the minor fissure which may be due to atelectasis or infection. Electronically Signed   By: Toribio Agreste M.D.   On: 07/16/2024 10:53   DG Chest Port 1 View Result Date: 07/16/2024 CLINICAL DATA:  [id number] Aspiration into airway [id number]. EXAM: PORTABLE CHEST 1 VIEW COMPARISON:  06/23/2024. FINDINGS: Bilateral lungs appear hyperlucent with coarse bronchovascular markings, in keeping with COPD. Bilateral lungs otherwise appear clear. No dense consolidation or lung collapse. Bilateral costophrenic angles are clear. Stable cardio-mediastinal silhouette. There are surgical staples along the heart border and sternotomy wires, status post CABG (coronary artery bypass graft). No acute osseous abnormalities. The soft tissues are within normal limits. Enteric tube is seen coursing below the left hemidiaphragm however, the tip is not included on the film. IMPRESSION: No active disease. COPD. Electronically Signed   By: Ree Molt M.D.   On: 07/16/2024 09:09   DG Abd 1 View Result Date: 07/16/2024 CLINICAL DATA:  Vomiting.  NG tube placement. EXAM: ABDOMEN - 1 VIEW COMPARISON:  07/15/2024 FINDINGS: Nasogastric tube has tip and side-port over the stomach in the upper abdomen just left of midline. Visualized lower thorax unchanged. Abdominopelvic images demonstrate a non obstructive bowel gas pattern. Mild fecal retention  throughout the colon. Surgical clips over the right upper quadrant. There are degenerative changes of the spine and hips. Calcified plaque over the abdominal aorta and iliac arteries. IMPRESSION: 1. Nasogastric tube with tip and side-port over the stomach. 2. Nonobstructive bowel gas pattern. Mild fecal retention. 3. Aortic atherosclerosis. Electronically Signed   By: Toribio Agreste M.D.   On: 07/16/2024 07:51   DG Abd 1 View Result Date: 07/15/2024 CLINICAL DATA:  Orogastric tube placement. EXAM: ABDOMEN - 1 VIEW COMPARISON:  June 29, 2023. FINDINGS: Distal tip of nasogastric tube is seen in expected position of proximal stomach. IMPRESSION: Nasogastric tube tip seen in expected position of proximal stomach. Electronically Signed   By: Lynwood Landy Raddle M.D.   On: 07/15/2024 10:25   DG Chest Port 1 View Result Date: 07/13/2024 CLINICAL DATA:  Respiratory failure with hypoxia. EXAM: PORTABLE CHEST 1 VIEW COMPARISON:  Multiple previous chest x-rays and recent chest CT. FINDINGS: Stable surgical changes from bypass surgery. The cardiac silhouette, mediastinal and hilar contours are within normal limits and stable. Underlying emphysematous changes and pulmonary scarring. No pleural effusions or discrete pulmonary infiltrates. No pneumothorax. IMPRESSION: Emphysematous changes and pulmonary scarring but no acute overlying pulmonary process. Electronically Signed   By: MYRTIS Stammer M.D.   On: 07/13/2024 16:08   US  Abdomen Limited RUQ (LIVER/GB) Result Date: 07/13/2024 CLINICAL DATA:  Portal hypertension. EXAM: ULTRASOUND ABDOMEN LIMITED RIGHT UPPER QUADRANT COMPARISON:  Abdominal ultrasound 10/19/2022 FINDINGS: Gallbladder: Surgically absent. Common bile duct: Diameter: 3.3 mm Liver: No focal lesion identified. Parenchymal echogenicity is increased and diffusely heterogeneous. There is lobular liver contour. Portal vein is patent on color Doppler imaging with normal direction of blood flow towards the liver.  Other: None. IMPRESSION: Lobular liver contour with increased echogenicity and heterogeneous parenchyma consistent with cirrhosis. No focal liver lesion identified. Electronically Signed   By: Greig Maple HERO.D.  On: 07/13/2024 16:03   DG Chest Port 1 View Result Date: 07/12/2024 CLINICAL DATA:  Shortness of breath. EXAM: PORTABLE CHEST 1 VIEW COMPARISON:  07/11/2024 FINDINGS: The cardio pericardial silhouette is enlarged. There is pulmonary vascular congestion without overt pulmonary edema. Trace bilateral pleural effusions. Old posterior left sixth rib fracture. Telemetry leads overlie the chest. IMPRESSION: Enlargement of the cardiopericardial silhouette with new pulmonary vascular congestion and trace bilateral pleural effusions. Electronically Signed   By: Camellia Candle M.D.   On: 07/12/2024 12:11   CT Angio Chest PE W/Cm &/Or Wo Cm Result Date: 07/11/2024 CLINICAL DATA:  High probability for PE. Chest pain with shortness of breath EXAM: CT ANGIOGRAPHY CHEST WITH CONTRAST TECHNIQUE: Multidetector CT imaging of the chest was performed using the standard protocol during bolus administration of intravenous contrast. Multiplanar CT image reconstructions and MIPs were obtained to evaluate the vascular anatomy. RADIATION DOSE REDUCTION: This exam was performed according to the departmental dose-optimization program which includes automated exposure control, adjustment of the mA and/or kV according to patient size and/or use of iterative reconstruction technique. CONTRAST:  75mL OMNIPAQUE  IOHEXOL  350 MG/ML SOLN COMPARISON:  CT angiogram chest 05/09/2024 FINDINGS: Cardiovascular: Satisfactory opacification of the pulmonary arteries to the segmental level. No evidence of pulmonary embolism. Patient is status post cardiac surgery. The heart is enlarged. There is no pericardial effusion. There are atherosclerotic calcifications of the aorta and coronary arteries. Aorta is normal in size. Mediastinum/Nodes: There  are enlarged right hilar lymph nodes measuring up to 11 mm. Enlarged paratracheal lymph nodes measure up to 10 mm. Visualized thyroid gland and esophagus are within normal limits. Lungs/Pleura: Moderate emphysema again seen. There is stable scarring in the right lung apex. There is new patchy slightly nodular airspace disease in the right upper lobe measuring 2.0 by 1.1 cm. There is right-sided central peribronchial wall thickening. Previously identified airspace disease in the left lower lobe has resolved. There is no pleural effusion or pneumothorax. There is a stable left lower lobe pulmonary nodule image 6/57. Upper Abdomen: No acute abnormality. Musculoskeletal: No chest wall abnormality. No acute or significant osseous findings. Review of the MIP images confirms the above findings. IMPRESSION: 1. No evidence for pulmonary embolism. 2. New patchy slightly nodular airspace disease in the right upper lobe worrisome for pneumonia. Follow-up CT recommended in 4-6 weeks recommended to ensure resolution and to exclude underlying nodule. 3. Right-sided central peribronchial wall thickening worrisome for bronchitis. 4. Right hilar and mediastinal lymphadenopathy, likely reactive. 5. Stable left lower lobe pulmonary nodule. 6. Cardiomegaly. Aortic Atherosclerosis (ICD10-I70.0) and Emphysema (ICD10-J43.9). Electronically Signed   By: Greig Pique M.D.   On: 07/11/2024 18:54   DG Chest Portable 1 View Result Date: 07/11/2024 CLINICAL DATA:  SOB EXAM: PORTABLE CHEST - 1 VIEW COMPARISON:  June 24, 2024 FINDINGS: Biapical pleural thickening. Sternotomy wires and CABG markers. No focal airspace consolidation, pleural effusion, or pneumothorax. Mild cardiomegaly. Tortuous aorta with aortic atherosclerosis. No acute fracture or destructive lesions. Multilevel thoracic osteophytosis. IMPRESSION: No acute cardiopulmonary abnormality. Electronically Signed   By: Rogelia Myers M.D.   On: 07/11/2024 17:25   CARDIAC  CATHETERIZATION Result Date: 07/02/2024   Prox LAD to Mid LAD lesion is 90% stenosed.   Ost LM to Mid LM lesion is 50% stenosed.   Ost Cx to Prox Cx lesion is 90% stenosed.   Ost RCA to Prox RCA lesion is 100% stenosed.   Mid Graft to Dist Graft lesion is 95% stenosed.   Recommend uninterrupted dual  antiplatelet therapy with Aspirin  81mg  daily and Clopidogrel  75mg  daily for a minimum of 6 months (stable ischemic heart disease-Class I recommendation). 1.  Severe native three-vessel CAD 2.  LIMA to LAD and SVG to OM widely patent 3.  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  Successful direct stenting of the distal SVG to PDA with distal protection with a 4.0 x 15 mm drug-eluting stent with intravascular ultrasound guidance 5.  Aspirin  and clopidogrel  for at least 6 months followed by clopidogrel  indefinitely 6.  Continue workup for aortic valve replacement   DG Chest 2 View Result Date: 06/24/2024 CLINICAL DATA:  sob EXAM: CHEST - 2 VIEW COMPARISON:  June 03, 2024 FINDINGS: Biapical pleural thickening. No focal airspace consolidation, pleural effusion, or pneumothorax. No cardiomegaly. Sternotomy wires and CABG markers. Tortuous aorta with aortic atherosclerosis. No acute fracture or destructive lesions. Multilevel thoracic osteophytosis. Osteopenia. IMPRESSION: No acute cardiopulmonary abnormality. Electronically Signed   By: Rogelia Myers M.D.   On: 06/24/2024 11:54    ECHO 04/2024: 1. Left ventricular ejection fraction, by estimation, is 60 to 65%. The left ventricle has normal function. The left ventricle has no regional wall motion abnormalities. The left ventricular internal cavity size was mildly dilated. Left ventricular diastolic parameters are consistent with Grade I diastolic dysfunction (impaired relaxation).   2. Right ventricular systolic function is normal. The right ventricular  size is normal.   3. The mitral valve is normal in structure. No evidence of mitral valve   regurgitation.   4. The aortic valve is normal in structure. Aortic valve regurgitation is  trivial. Severe aortic valve stenosis.   TELEMETRY reviewed by me 07/23/2024: sinus rhythm, rate 90s  EKG reviewed by me: sinus tachycardia ST depression, rate 129 bpm  Data reviewed by me 07/23/2024: last 24h vitals tele labs imaging I/O hospitalist progress note, PCCM notes  Principal Problem:   Acute respiratory distress Active Problems:   Hyperlipidemia   Cognitive impairment   Alcohol  abuse   Essential hypertension   CAD (coronary artery disease)   Stroke (HCC)   Iron  deficiency anemia   Acute on chronic diastolic CHF (congestive heart failure) (HCC)   COPD exacerbation (HCC)   Elevated lactic acid level   Lobar pneumonia (HCC)   Metabolic acidosis, increased anion gap   Myocardial injury    ASSESSMENT AND PLAN:  [name] is a 63 y.o. 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 Found to have pneumonia, overnight troponins uptrending. Cardiology was consulted for further evaluation.   # Ventricular Ectopy Per tele patient with bigeminy PVCs, started on IV amio on 07/30 for ventricular ectopy. PVC much improved s/p IV amio. Per tele this AM SR with rate 90s with no more ventricular ectopy -Continue to monitor telemetry closely.  -Monitor and replenish electrolytes for a goal K >4, Mag >2  -Continue Amio 400 mg BID for 10 days, then 200 mg daily.   # Acute hypoxic respiratory failure, mechanical ventilation # Aspiration pneumonia # Alcohol  withdrawal, improving # Acute on chronic HFpEF (flash pulmonary edema) # Severe aortic stenosis Presented  with SOB, cough, imaging concerning for HCAP/COPD exacerbation. LA  elevated on presentation at 3.6 > 5.9> 3.4. s/p extubation 08/01. Increased O2 need to 10L, concern for aspiration event on 08/05. NGT in place -Will plan to resume home torsemide  10 mg daily when alkalosis improves.  -Will resume home dapagliflozin  10 mg daily when able to take PO medication. -Will plan to optimize GDMT as BP allows and when able to take PO.  (Home meds: losartan , metoprolol  succinate, spironolactone ) -Further management of respiratory failure/ aspiration PNA as per primary team.  -CIWA protocol given hx of alcohol  abuse. -Scheduled to see Dr. Katina 7/30 to discuss aortic valve repair/replacement options. Will reschedule this appointment when patient stabilizes.    # NSTEMI # Coronary artery disease s/p CABG x3 (04/2012) # Hypertension # Hyperlipidemia With recent LHC, s/p DES to SVG to PDA 07/02/2024. Reported some CP when he first came to hospital which has now resolved, no recurrence. Troponin trended 44 > 144 > 205 > 980 > 1862 > 5766 > 5756. Patient weaned off levo on 07/31, BP improving and stabilizing. -Continue home plavix  75 mg daily. Continue aspirin  81 mg daily. Continue DAPT due to recent stent placement on 07/16. -Started on heparin  gtt by [facility] 7/26, Low Hgb. Completed 24 hours of heparin .  -Continue Crestor  20 mg daily for high-intensity statin management.  -Will resume home metoprolol  succinate when able to tolerate PO. -Home losartan , Imdur  held due to BP borderline. Will consider resuming as BP continues to improve and able to tolerate PO. -Recommend MAP > 65.   # Acute on chronic anemia # Hx AVM Patient received blood transfusion on 07/28, 07/31, 08/04.  Hgb 8.0 > 8.6 >9.3 -Closely monitor H&H. -Continue PPIs. -Goal Hgb >8 due to cardiac history. Recommend transfusion when <8.  -Patient not on aspirin  historically due to history of multiple small bowel AVMs that have been intermittently  bleeding for years. Continue DAPT due to recent stent placement.  -Management per primary team.   Did the patient have an acute coronary syndrome (MI, NSTEMI, STEMI, etc) this admission?:  Yes                               AHA/ACC ACS Clinical Performance & Quality Measures: Aspirin  prescribed? - Yes ADP Receptor Inhibitor (Plavix /Clopidogrel , Brilinta/Ticagrelor or Effient/Prasugrel) prescribed (includes medically managed patients)? - Yes Beta Blocker prescribed? - Yes High Intensity Statin (Lipitor 40-80mg  or Crestor  20-40mg ) prescribed? - Yes EF assessed during THIS hospitalization? - No - patient with recent echo performed on 05/10/2024.  For EF <40%, was ACEI/ARB prescribed? - Not Applicable (EF >/= 40%) For EF <40%, Aldosterone Antagonist (Spironolactone  or Eplerenone) prescribed? - Not Applicable (EF >/= 40%) Cardiac Rehab Phase II ordered (including medically managed patients)? - Yes, Patient with recent outpatient LHC performed on 07/16, referred to cardiac rehab during that time on 07/04/24.     This patient's plan of care was discussed and created with Dr. Florencio and he is in agreement.  Signed: Dorene Comfort, PA-C  07/23/2024, 12:57 PM [facility] Cardiology

## 2024-07-23 NOTE — Progress Notes (Signed)
 Progress Note    [name]  FMW:[id number] DOB: 27-Oct-1961  DOA: 07/11/2024 PCP: Osa Geralds, NP      Brief Narrative:    Medical records reviewed and are as summarized below:  [name] is a 63 y.o. male with medical history significant of  GIB, alcohol  abuse, COPD on 2 L oxygen, hypertension, CAD with prior CABG and recent Stent placement,, severe aortic stenosis, HLD, stroke, dementia, chronic diastolic CHF, who presented to the hospital on 07/11/2024 with SOB, cough productive of yellowish sputum, sided chest pain.   Patient was recently hospitalized from 7/16 - 7/17 for elective LHC. Pt was s/p of successful direct stenting of distal SVG to PDA.  CTA negative for PE, showed patchy infiltration in the right upper lobe.  Patient is admitted to telemetry bed as inpatient.    He was admitted to the hospitalist service for COPD exacerbation and acute hypoxic respiratory failure.  He developed worsening respiratory distress shortly after admission.  He was placed on BiPAP and was transferred to the ICU for further management.  He has a became agitated and this was attributed to acute alcohol  withdrawal syndrome.  He was started on IV Precedex  infusion for this. He developed acute toxic metabolic encephalopathy and worsening respiratory failure likely from aspiration pneumonia.  He required intubation and mechanical ventilation in the ICU.  He became hypotensive with induction meds despite IV fluids and IV Levophed  infusion.  There was brief loss of pulse but ROSC was achieved after 2 minutes of CPR and 1 dose of epinephrine .    Significant Hospital Events: Including procedures, antibiotic start and stop dates in addition to other pertinent events   7/25: admit with respiratory failure 7/26: clinically decompensated, worsening respiratory status initiated on BiPAP 7/27: on nasal cannula, reports anxiety and tremors consistent with EtOH withdrawal.  Received a total of 15  mg/kg of phenobarbital , however he remained agitated requiring low dose precedex  gtt.  7/28: Pt resting in bed no agitation on precedex  gtt @0 .5 mcg/min.  Orders placed to transfuse 1 unit of pRBC's hgb 7.2 goal hgb 8.0.  No signs of active bleeding  7/29: Pt remains encephalopathic with severe DT's requiring precedex  gtt.  NGT placed to initiate tube feeds and administer medications  7/30: Earlier this morning with vomiting, concern for aspiration event as now with increased WOB and increasing FiO2 requirements (up to 4L). Remains encephalopathic on Precedex .  Respiratory status continued to decline, progressed to agonal respirations, Required INTUBATION for airway protection.  Hypotensive with induction meds despite IV fluids and Levophed  resulting in brief loss of pulse (ROSC obtained after 2 minutes of CPR and 1 epi).  Central line placed due to vasopressors. 7/31: Overnight with episode on non-sustained SVT which converted spontaneously, remains on Amiodarone  gtt per Cardiology.  Nursing reported black tarry stools overnight, Hgb remains stable.   Levophed  weaned off, on minimal vent settings, will perform WUA and SBT as mental status allows.  With fever of 101 F overnight and WBC nearly doubled, tracheal aspirate results pending, will check blood cultures and UA, broaden to empiric Vancomycin  and Zosyn  pending culture results.   08/01:  No significant events reported overnight. Received 1 unit pRBC's overnight for Hgb 7.5 with appropriate response to 8.8 and has since remained stable with no reports of bleeding from nursing.  Fevers resolved, WBC improved with broadening to Zosyn  yesterday, cultures currently with no growth.  Weaned off levophed .  On minimal vent support, passed WUA and SBT,  EXTUBATED TO HHFNC.  Diuresed with 20 mg IV Lasix  x1 dose with extubation, needs aggressive pulmonary toilet. 8/6: Patient with worsening respiratory status and oxygen requirement this morning, appears more  congested.  Apparently concern of aspiration event yesterday evening when his NG tube was dislodged.  Ordered repeat chest x-ray and sputum culture, worsening leukocytosis and procalcitonin at 0.26.  Starting on cefepime , Mucomyst  and giving 1 dose of IV Lasix .  Currently on 10 L of oxygen   Assessment/Plan:   Principal Problem:   Acute respiratory distress Active Problems:   COPD exacerbation (HCC)   Lobar pneumonia (HCC)   Elevated lactic acid level   Metabolic acidosis, increased anion gap   Essential hypertension   Acute on chronic diastolic CHF (congestive heart failure) (HCC)   Hyperlipidemia   Stroke (HCC)   CAD (coronary artery disease)   Iron  deficiency anemia   Alcohol  abuse   Cognitive impairment   Myocardial injury   Nutrition Problem: Inadequate oral intake Etiology: lethargy/confusion  Signs/Symptoms: NPO status   Body mass index is 29.45 kg/m.  (Obesity)   Acute on chronic hypoxic respiratory failure: Worsening respiratory status and oxygen requirement again today, currently on 10 L of oxygen.  Concern of aspiration yesterday evening. - Continue supplemental oxygen-wean as tolerated S/p extubation on 07/18/2024.  Was intubated on 07/16/2024.  Aspiration pneumonia: Another concern of aspiration yesterday evening with resultant worsening respiratory status. Worsening leukocytosis again.  PCT at 0.26  Completed IV Zosyn  on 07/21/2024. Suspected septic shock: Resolved.  He is off of IV Levophed  infusion.   - Repeat chest x-ray - Starting on cefepime  -Sputum culturewill -Restarting Mucomyst  nebulizer due to increased congestion and respiratory secretions. - Aggressive pulmonary toilet - Continue with supportive care  COPD exacerbation: Continue bronchodilators.  Completed steroids.   Acute toxic metabolic encephalopathy: Mental status is improving.  Continue supportive care Alcohol  use disorder with alcohol  withdrawal syndrome: He is off of IV Precedex   infusion.  Continue IV thiamine .  Brief cardiac arrest due to severe hypotension from induction meds on 07/16/2024 (ROSC achieved after 2 minutes of CPR and 1 dose of epinephrine ).   Acute NSTEMI, elevated troponins (peaked at 5,766), CAD, recent elective left heart cath s/p DES to SVG to PDA 07/02/2024,  history of CABG x 3 (May 2013): Continue aspirin , Plavix  and rosuvastatin .  Acute on chronic diastolic CHF, severe aortic stenosis: Patient was given 40 mg IV Lasix  x 1 dose today S/p treatment with IV Lasix . 2D echo on 05/10/2024 showed EF estimated at 60 to 65%, grade 1 diastolic dysfunction, severe aortic valve stenosis.  Chronic anemia, history of GI bleed: S/p transfusion with 3 unit of PRBCs on 07/21/2024, 7/28 and 07/17/2024 Hemoglobin at 9.3 today. No evidence of overt GI bleeding thus far.  Continue IV Protonix .  H&H stable. He was evaluated by gastroenterologist on 07/13/2024.  GI signed off. normal colonoscopy on 05/08/2024 Normal EGD on 06/01/2023,  Enteroscopy on 05/16/2023 showed duodenal AVMs   Dysphagia: N.p.o. recommended by speech therapist for now.  Continue enteral nutrition via NG tube MBBS was again canceled today due to worsening respiratory status.  Mild hypernatremia: Improved Continue free water  via NG tube.  Continue enteral nutrition.  Monitor BMP.  AKI: Resolved   Hyperglycemia: Glucose levels are stable.  Hemoglobin A1c was 5.3 on 10/28/2022   Comorbidities include hypertension, hyperlipidemia, history of stroke   Diet Order             Diet NPO time specified  Diet effective  now                  Consultants: Physiological scientist  Procedures: Intubated on 07/16/2024 and extubated on 07/18/2024 Left IJ central line placed on 07/16/2024  Medications:    acetylcysteine   4 mL Nebulization TID   amiodarone   400 mg Per Tube BID   Followed by   NOREEN ON 07/28/2024] amiodarone   200 mg Per Tube Daily   ascorbic acid   500 mg  Per Tube Daily   aspirin   81 mg Per Tube Daily   Chlorhexidine  Gluconate Cloth  6 each Topical Q0600   cholecalciferol   2,000 Units Per Tube Daily   clopidogrel   75 mg Per Tube Daily   docusate  100 mg Per Tube BID   enoxaparin  (LOVENOX ) injection  40 mg Subcutaneous QHS   folic acid   1 mg Per Tube Daily   free water   100 mL Per Tube Q4H   ipratropium-albuterol   3 mL Nebulization Q4H   iron  polysaccharides  150 mg Per Tube Daily   multivitamin with minerals  1 tablet Per Tube Daily   mouth rinse  15 mL Mouth Rinse 4 times per day   pantoprazole  (PROTONIX ) IV  40 mg Intravenous Q12H   polyethylene glycol  17 g Per Tube Daily   rosuvastatin   20 mg Per Tube Daily   thiamine   100 mg Per Tube Daily   Continuous Infusions:  ceFEPime  (MAXIPIME ) IV     feeding supplement (PIVOT 1.5 CAL) Stopped (07/23/24 0000)     Anti-infectives (From admission, onward)    Start     Dose/Rate Route Frequency Ordered Stop   07/23/24 1400  ceFEPIme  (MAXIPIME ) 2 g in sodium chloride  0.9 % 100 mL IVPB        2 g 200 mL/hr over 30 Minutes Intravenous Every 8 hours 07/23/24 1203     07/17/24 1000  piperacillin -tazobactam (ZOSYN ) IVPB 3.375 g  Status:  Discontinued        3.375 g 12.5 mL/hr over 240 Minutes Intravenous Every 8 hours 07/17/24 0811 07/21/24 1100   07/17/24 1000  vancomycin  (VANCOREADY) IVPB 2000 mg/400 mL  Status:  Discontinued        2,000 mg 200 mL/hr over 120 Minutes Intravenous  Once 07/17/24 0811 07/17/24 1026   07/14/24 1200  cefTRIAXone  (ROCEPHIN ) 2 g in sodium chloride  0.9 % 100 mL IVPB        2 g 200 mL/hr over 30 Minutes Intravenous Every 24 hours 07/14/24 1030 07/16/24 1204   07/12/24 1600  piperacillin -tazobactam (ZOSYN ) IVPB 3.375 g  Status:  Discontinued        3.375 g 12.5 mL/hr over 240 Minutes Intravenous Every 8 hours 07/12/24 1503 07/14/24 1030   07/12/24 0900  vancomycin  (VANCOREADY) IVPB 1250 mg/250 mL  Status:  Discontinued        1,250 mg 166.7 mL/hr over 90 Minutes  Intravenous Every 12 hours 07/11/24 2012 07/12/24 1458   07/11/24 2100  ceFEPIme  (MAXIPIME ) 2 g in sodium chloride  0.9 % 100 mL IVPB  Status:  Discontinued        2 g 200 mL/hr over 30 Minutes Intravenous Every 8 hours 07/11/24 1959 07/12/24 1458   07/11/24 2100  vancomycin  (VANCOREADY) IVPB 2000 mg/400 mL        2,000 mg 200 mL/hr over 120 Minutes Intravenous  Once 07/11/24 2004 07/11/24 2255   07/11/24 1945  cefTRIAXone  (ROCEPHIN ) 1 g in sodium chloride  0.9 % 100 mL IVPB  Status:  Discontinued        1 g 200 mL/hr over 30 Minutes Intravenous  Once 07/11/24 1930 07/11/24 1933   07/11/24 1945  azithromycin  (ZITHROMAX ) 500 mg in sodium chloride  0.9 % 250 mL IVPB  Status:  Discontinued        500 mg 250 mL/hr over 60 Minutes Intravenous  Once 07/11/24 1930 07/11/24 1955   07/11/24 1945  cefTRIAXone  (ROCEPHIN ) 2 g in sodium chloride  0.9 % 100 mL IVPB  Status:  Discontinued        2 g 200 mL/hr over 30 Minutes Intravenous  Once 07/11/24 1933 07/11/24 1955       Family Communication/Anticipated D/C date and plan/Code Status    DVT prophylaxis: Lovenox      Code Status: Full Code  Family Communication: Discussed with patient  Disposition Plan: Plan to discharge to SNF   Status is: Inpatient Remains inpatient appropriate because: Acute respiratory failure, aspiration pneumonia   Subjective:  Patient with worsening shortness of breath.  Apparently had some aspiration yesterday evening when NG tube was dislodged.  Feeling very short of breath and weak.  Objective:    Vitals:   07/23/24 0939 07/23/24 1000 07/23/24 1100 07/23/24 1109  BP:  135/82 133/77   Pulse: (!) 102 94 93   Resp: (!) 25 (!) 23 19   Temp:      TempSrc:      SpO2: 96% 100% 100% 100%  Weight:      Height:       No data found.   Intake/Output Summary (Last 24 hours) at 07/23/2024 1243 Last data filed at 07/23/2024 1215 Gross per 24 hour  Intake 840 ml  Output 1650 ml  Net -810 ml   Filed Weights    07/21/24 0500 07/22/24 0242 07/23/24 0500  Weight: 98.6 kg 98.6 kg 98.5 kg    Exam: General.  Chronically ill-appearing gentleman,  Pulmonary.  Bilateral wheeze with some scattered crackles, a lot of upper respiratory secretions with gurgling sounds, increased work of breathing. CV.  Regular rate and rhythm, no JVD, rub or murmur. Abdomen.  Soft, nontender, nondistended, BS positive. CNS.  Alert and oriented .  No focal neurologic deficit. Extremities.  No edema,  pulses intact and symmetrical.   Data Reviewed:   I have personally reviewed following labs and imaging studies:  Labs: Labs show the following:   Basic Metabolic Panel: Recent Labs  Lab 07/16/24 1608 07/16/24 1611 07/17/24 0222 07/18/24 0450 07/19/24 0559 07/20/24 0531 07/21/24 0455 07/22/24 0416 07/23/24 0457  NA  --    < > 143 143 145 147* 145 146* 142  K  --    < > 3.9 3.1* 3.7 4.4 4.0 3.9 4.1  CL  --    < > 105 108 108 109 105 104 101  CO2  --    < > 27 28 32 29 33* 33* 32  GLUCOSE  --    < > 129* 140* 167* 178* 193* 186* 138*  BUN  --    < > 34* 24* 23 27* 33* 36* 37*  CREATININE  --    < > 1.38* 1.04 0.85 1.01 1.15 0.97 1.12  CALCIUM   --    < > 7.9* 7.8* 8.0* 8.6* 8.4* 8.6* 8.6*  MG 2.4  --  2.6* 2.4 2.4  --   --   --  2.7*  PHOS  --    < > 3.1 2.4* 3.5 3.1 3.4  --  4.6   < > =  values in this interval not displayed.   GFR Estimated Creatinine Clearance: 80 mL/min (by C-G formula based on SCr of 1.12 mg/dL). Liver Function Tests: Recent Labs  Lab 07/17/24 0222 07/18/24 0450 07/19/24 0559 07/20/24 0531 07/21/24 0455  ALBUMIN 2.9* 2.6* 2.6* 3.0* 2.7*   No results for input(s): LIPASE, AMYLASE in the last 168 hours. No results for input(s): AMMONIA in the last 168 hours.  Coagulation profile No results for input(s): INR, PROTIME in the last 168 hours.  CBC: Recent Labs  Lab 07/19/24 0559 07/20/24 0531 07/21/24 0455 07/22/24 0416 07/23/24 0457  WBC 14.9* 12.8* 10.5 14.0*  18.8*  HGB 8.7* 9.2* 8.0* 8.6* 9.3*  HCT 29.6* 31.6* 26.9* 28.3* 31.0*  MCV 91.9 92.9 92.4 91.6 92.5  PLT 227 229 211 247 247   Cardiac Enzymes: No results for input(s): CKTOTAL, CKMB, CKMBINDEX, TROPONINI in the last 168 hours. BNP (last 3 results) No results for input(s): PROBNP in the last 8760 hours. CBG: Recent Labs  Lab 07/22/24 1937 07/22/24 2312 07/23/24 0318 07/23/24 0744 07/23/24 1146  GLUCAP 134* 134* 118* 121* 118*   D-Dimer: No results for input(s): DDIMER in the last 72 hours. Hgb A1c: No results for input(s): HGBA1C in the last 72 hours. Lipid Profile: No results for input(s): CHOL, HDL, LDLCALC, TRIG, CHOLHDL, LDLDIRECT in the last 72 hours.  Thyroid function studies: No results for input(s): TSH, T4TOTAL, T3FREE, THYROIDAB in the last 72 hours.  Invalid input(s): FREET3 Anemia work up: No results for input(s): VITAMINB12, FOLATE, FERRITIN, TIBC, IRON , RETICCTPCT in the last 72 hours. Sepsis Labs: Recent Labs  Lab 07/16/24 1607 07/16/24 1928 07/17/24 0222 07/20/24 0531 07/21/24 0455 07/22/24 0416 07/23/24 0457  PROCALCITON  --   --   --   --   --   --  0.26  WBC  --   --    < > 12.8* 10.5 14.0* 18.8*  LATICACIDVEN 0.8 1.2  --   --   --   --   --    < > = values in this interval not displayed.    Microbiology Recent Results (from the past 240 hours)  Culture, Respiratory w Gram Stain     Status: None   Collection Time: 07/16/24  4:02 PM   Specimen: Tracheal Aspirate; Respiratory  Result Value Ref Range Status   Specimen Description   Final    TRACHEAL ASPIRATE Performed at [facility], [address] [place], [place] [address]    Special Requests   Final    NONE Performed at [facility], [address] [place], [place] [address]    Gram Stain   Final    RARE WBC PRESENT, PREDOMINANTLY PMN NO ORGANISMS SEEN    Culture   Final    NO GROWTH 2 DAYS Performed at [facility], [address] [place], [place] [address]    Report Status 07/19/2024 FINAL  Final  MRSA Next Gen by PCR, Nasal     Status: None   Collection Time: 07/17/24  8:26 AM   Specimen: Nasal Mucosa; Nasal Swab  Result Value Ref Range Status   MRSA by PCR Next Gen NOT DETECTED NOT DETECTED Final    Comment: (NOTE) The GeneXpert MRSA Assay (FDA approved for NASAL specimens only), is one component of a comprehensive MRSA colonization surveillance program. It is not intended to diagnose MRSA infection nor to guide or monitor treatment for MRSA infections. Test performance is not FDA approved in patients  less than 89 years old. Performed at [facility], [address] [place], [place] [address]   Culture, blood (Routine X 2) w Reflex to ID Panel     Status: None   Collection Time: 07/17/24  9:57 AM   Specimen: BLOOD  Result Value Ref Range Status   Specimen Description BLOOD BLOOD LEFT HAND  Final   Special Requests   Final    BOTTLES DRAWN AEROBIC AND ANAEROBIC Blood Culture adequate volume   Culture   Final    NO GROWTH 5 DAYS Performed at [facility], [address] [place], [place] [address]    Report Status 07/22/2024 FINAL  Final  Culture, blood (Routine X 2) w Reflex to ID Panel     Status: None   Collection Time: 07/17/24  9:57 AM   Specimen: BLOOD  Result Value Ref Range Status   Specimen Description BLOOD BLOOD LEFT WRIST  Final   Special Requests   Final    BOTTLES DRAWN AEROBIC AND ANAEROBIC Blood Culture adequate volume   Culture   Final    NO GROWTH 5 DAYS Performed at [facility], [address] [place], [place] [address]    Report Status 07/22/2024 FINAL  Final    Procedures and diagnostic studies:  DG ABD ACUTE 2+V W 1V CHEST Result Date: 07/22/2024 CLINICAL DATA:  [id number] Dyspnea [id number] Encounter for imaging study to confirm nasogastric (NG) tube placement [id number] EXAM: DG ABDOMEN ACUTE WITH 1 VIEW CHEST  COMPARISON:  Chest x-ray 07/19/2024 FINDINGS: Enteric tube courses below the diaphragm with tip and side port overlying the expected region of the gastric lumen. The heart and mediastinal contours are within normal limits. No focal consolidation. Chronic coarsened interstitial markings with no overt pulmonary edema. Bilateral trace pleural effusion. No pneumothorax. Right upper quadrant surgical clips. There is no evidence of dilated bowel loops or free intraperitoneal air. No radiopaque calculi or other significant radiographic abnormality is seen. No acute osseous abnormality.  Intact sternotomy wires. IMPRESSION: 1. Enteric tube in good position. 2. Bowel trace pleural effusions. 3. Nonobstructive bowel gas pattern. Electronically Signed   By: Morgane  Naveau M.D.   On: 07/22/2024 14:18     LOS: 12 days   Amaryllis Dare MD  [facility]   Pager on [email or web address]. If 7PM-7AM, please contact night-coverage at [email or web address]  07/23/2024, 12:43 PM

## 2024-07-23 NOTE — Plan of Care (Signed)
  Problem: Education: Goal: Knowledge of General Education information will improve Description: Including pain rating scale, medication(s)/side effects and non-pharmacologic comfort measures Outcome: Progressing   Problem: Clinical Measurements: Goal: Ability to maintain clinical measurements within normal limits will improve Outcome: Progressing Goal: Diagnostic test results will improve Outcome: Progressing   Problem: Activity: Goal: Risk for activity intolerance will decrease Outcome: Not Progressing

## 2024-07-23 NOTE — Progress Notes (Addendum)
 SLP Cancellation Note  Patient Details Name: [name] MRN: [id number] DOB: 1961-04-08   Cancelled treatment:       Reason Eval/Treat Not Completed: Medical issues which prohibited therapy  This writer went by pt's bedside to assess medical stability for potential PO trials and/or MBSS. Pt visibly struggling to breath, SOB with very wet audible congestion. RT was in with his nurse to provide breathing treatment. In addition, pt's nurse reports that pt is sicker this morning and his O2 requirement is greater. Pt remains inappropriate any any PO trials and should remain strictly NPO. ST services will follow pt peripherally over the next several days for PO readiness.   Theodora Lalanne B. Rubbie, M.S., CCC-SLP, Tree surgeon Certified Brain Injury Specialist [facility]  [facility] Office [phone] Ascom [phone] Fax [phone]

## 2024-07-23 NOTE — Progress Notes (Signed)
 Occupational Therapy Treatment Patient Details Name: [name] MRN: [id number] DOB: 08/07/1961 Today's Date: 07/23/2024   History of present illness 63 year old male patient admitted to the ICU with severe alcohol  withdrawal Dts requiring multiple doses of Phenobarb and eventually precedex  drip. Unfortunately course complicated by an aspriation event requiring intubation and mechanical ventilation with course c/b peri intubation brief cardiac arrest requiring one round of CPR.  Pt ultimately needing to be extubated ~24 hours.   OT comments  [name] was seen for OT treatment on this date. Upon arrival to room pt seated in chair, agreeable to tx. Pt requires SETUP + SUPERVISION seated urinal use - cues for PLB with increased WOB noted (RR 27). Pt reports increased abdomen/R chest pain in sitting 9/10, decreased to 6/10 with BLE reclined - RN notified and in with pain medicine. Extensive education on condition management and ECS. Pt making good progress toward goals, will continue to follow POC. Discharge recommendation remains appropriate.        If plan is discharge home, recommend the following:  A lot of help with walking and/or transfers;A lot of help with bathing/dressing/bathroom;Assistance with cooking/housework;Direct supervision/assist for medications management;Assist for transportation   Equipment Recommendations  None recommended by OT    Recommendations for Other Services      Precautions / Restrictions Precautions Precautions: Fall Recall of Precautions/Restrictions: Intact Restrictions Weight Bearing Restrictions Per Provider Order: No       Mobility Bed Mobility               General bed mobility comments: not tested    Transfers Overall transfer level: Needs assistance Equipment used: None Transfers: Sit to/from Stand Sit to Stand: Contact guard assist                 Balance Overall balance assessment: Needs assistance Sitting-balance support:  Feet supported Sitting balance-Leahy Scale: Good     Standing balance support: Single extremity supported Standing balance-Leahy Scale: Fair                             ADL either performed or assessed with clinical judgement   ADL Overall ADL's : Needs assistance/impaired                                       General ADL Comments: SETUP + SUPERVISION seated urinal use - cues for PLB with increased pain and WOB noted (RR 27).    Extremity/Trunk Assessment              Vision       Restaurant manager, fast food Communication: No apparent difficulties   Cognition Arousal: Alert Behavior During Therapy: WFL for tasks assessed/performed Cognition: Cognition impaired   Orientation impairments: Situation                           Following commands: Intact        Cueing      Exercises      Shoulder Instructions       General Comments      Pertinent Vitals/ Pain       Pain Assessment Pain Assessment: 0-10 Pain Score: 9  Pain Location: abdomen and R chest Pain Descriptors / Indicators: Grimacing, Guarding, Aching Pain Intervention(s): Limited  activity within patient's tolerance, Repositioned, Patient requesting pain meds-RN notified, RN gave pain meds during session  Home Living                                          Prior Functioning/Environment              Frequency  Min 2X/week        Progress Toward Goals  OT Goals(current goals can now be found in the care plan section)  Progress towards OT goals: Progressing toward goals  Acute Rehab OT Goals OT Goal Formulation: With patient Time For Goal Achievement: 08/02/24 Potential to Achieve Goals: Good ADL Goals Pt Will Perform Lower Body Bathing: with min assist Pt Will Perform Lower Body Dressing: with min assist Pt Will Transfer to Toilet: with min assist Additional ADL Goal #1: Pt will participate  in functional cognitve assessment to assist with improved IADL mgmt at home  Plan      Co-evaluation                 AM-PAC OT 6 Clicks Daily Activity     Outcome Measure   Help from another person eating meals?: None Help from another person taking care of personal grooming?: A Little Help from another person toileting, which includes using toliet, bedpan, or urinal?: A Lot Help from another person bathing (including washing, rinsing, drying)?: A Lot Help from another person to put on and taking off regular upper body clothing?: A Little Help from another person to put on and taking off regular lower body clothing?: A Lot 6 Click Score: 16    End of Session Equipment Utilized During Treatment: Oxygen  OT Visit Diagnosis: Muscle weakness (generalized) (M62.81);History of falling (Z91.81)   Activity Tolerance Patient tolerated treatment well   Patient Left in chair;with call bell/phone within reach;with chair alarm set;with nursing/sitter in room   Nurse Communication          Time: 1510-1534 OT Time Calculation (min): 24 min  Charges: OT General Charges $OT Visit: 1 Visit OT Treatments $Self Care/Home Management : 23-37 mins  Elston Slot, M.S. OTR/L  07/23/24, 4:02 PM  ascom [phone]

## 2024-07-24 ENCOUNTER — Inpatient Hospital Stay

## 2024-07-24 DIAGNOSIS — R079 Chest pain, unspecified: Secondary | ICD-10-CM | POA: Diagnosis not present

## 2024-07-24 DIAGNOSIS — R0603 Acute respiratory distress: Secondary | ICD-10-CM | POA: Diagnosis not present

## 2024-07-24 DIAGNOSIS — J690 Pneumonitis due to inhalation of food and vomit: Secondary | ICD-10-CM | POA: Diagnosis not present

## 2024-07-24 DIAGNOSIS — J441 Chronic obstructive pulmonary disease with (acute) exacerbation: Secondary | ICD-10-CM | POA: Diagnosis not present

## 2024-07-24 DIAGNOSIS — J9621 Acute and chronic respiratory failure with hypoxia: Secondary | ICD-10-CM | POA: Diagnosis not present

## 2024-07-24 DIAGNOSIS — J9602 Acute respiratory failure with hypercapnia: Secondary | ICD-10-CM

## 2024-07-24 DIAGNOSIS — I469 Cardiac arrest, cause unspecified: Secondary | ICD-10-CM | POA: Diagnosis not present

## 2024-07-24 DIAGNOSIS — R7989 Other specified abnormal findings of blood chemistry: Secondary | ICD-10-CM | POA: Diagnosis not present

## 2024-07-24 DIAGNOSIS — J181 Lobar pneumonia, unspecified organism: Secondary | ICD-10-CM | POA: Diagnosis not present

## 2024-07-24 DIAGNOSIS — J9601 Acute respiratory failure with hypoxia: Secondary | ICD-10-CM | POA: Diagnosis not present

## 2024-07-24 LAB — CBC
HCT: 27.5 % — ABNORMAL LOW (ref 39.0–52.0)
Hemoglobin: 8.1 g/dL — ABNORMAL LOW (ref 13.0–17.0)
MCH: 27.6 pg (ref 26.0–34.0)
MCHC: 29.5 g/dL — ABNORMAL LOW (ref 30.0–36.0)
MCV: 93.9 fL (ref 80.0–100.0)
Platelets: 246 K/uL (ref 150–400)
RBC: 2.93 MIL/uL — ABNORMAL LOW (ref 4.22–5.81)
RDW: 17.5 % — ABNORMAL HIGH (ref 11.5–15.5)
WBC: 15.7 K/uL — ABNORMAL HIGH (ref 4.0–10.5)
nRBC: 0 % (ref 0.0–0.2)

## 2024-07-24 LAB — RENAL FUNCTION PANEL
Albumin: 2.9 g/dL — ABNORMAL LOW (ref 3.5–5.0)
Anion gap: 9 (ref 5–15)
BUN: 48 mg/dL — ABNORMAL HIGH (ref 8–23)
CO2: 33 mmol/L — ABNORMAL HIGH (ref 22–32)
Calcium: 8.7 mg/dL — ABNORMAL LOW (ref 8.9–10.3)
Chloride: 99 mmol/L (ref 98–111)
Creatinine, Ser: 1.05 mg/dL (ref 0.61–1.24)
GFR, Estimated: >60 mL/min (ref >60)
Glucose, Bld: 160 mg/dL — ABNORMAL HIGH (ref 70–99)
Phosphorus: 4.9 mg/dL — ABNORMAL HIGH (ref 2.5–4.6)
Potassium: 4.3 mmol/L (ref 3.5–5.1)
Sodium: 141 mmol/L (ref 135–145)

## 2024-07-24 LAB — GLUCOSE, CAPILLARY
Glucose-Capillary: 116 mg/dL — ABNORMAL HIGH (ref 70–99)
Glucose-Capillary: 130 mg/dL — ABNORMAL HIGH (ref 70–99)
Glucose-Capillary: 140 mg/dL — ABNORMAL HIGH (ref 70–99)
Glucose-Capillary: 146 mg/dL — ABNORMAL HIGH (ref 70–99)
Glucose-Capillary: 147 mg/dL — ABNORMAL HIGH (ref 70–99)
Glucose-Capillary: 98 mg/dL (ref 70–99)

## 2024-07-24 LAB — TROPONIN I (HIGH SENSITIVITY)
Troponin I (High Sensitivity): 72 ng/L — ABNORMAL HIGH (ref <18)
Troponin I (High Sensitivity): 75 ng/L — ABNORMAL HIGH (ref <18)
Troponin I (High Sensitivity): 68 ng/L — ABNORMAL HIGH (ref <18)

## 2024-07-24 LAB — MRSA NEXT GEN BY PCR, NASAL: MRSA by PCR Next Gen: NOT DETECTED

## 2024-07-24 LAB — BRAIN NATRIURETIC PEPTIDE: B Natriuretic Peptide: 3252.3 pg/mL — ABNORMAL HIGH (ref 0.0–100.0)

## 2024-07-24 MED ORDER — FUROSEMIDE 10 MG/ML IJ SOLN: 40.0000 mg | Freq: Every day | INTRAMUSCULAR | Status: DC

## 2024-07-24 MED ORDER — FUROSEMIDE 10 MG/ML IJ SOLN
40.0000 mg | Freq: Two times a day (BID) | INTRAMUSCULAR | Status: DC
  Administered 2024-07-24: 40 mg via INTRAVENOUS
  Filled 2024-07-24: qty 4

## 2024-07-24 MED ORDER — BUDESONIDE 0.5 MG/2ML IN SUSP
0.5000 mg | Freq: Two times a day (BID) | RESPIRATORY_TRACT | Status: DC
  Administered 2024-07-24 – 2024-07-31 (×21): 0.5 mg via RESPIRATORY_TRACT
  Filled 2024-07-24 (×15): qty 2

## 2024-07-24 MED ORDER — DIAZEPAM 5 MG/ML IJ SOLN
2.5000 mg | Freq: Four times a day (QID) | INTRAMUSCULAR | Status: DC | PRN
  Administered 2024-07-24 – 2024-07-26 (×3): 2.5 mg via INTRAVENOUS
  Filled 2024-07-24 (×3): qty 2

## 2024-07-24 MED ORDER — FUROSEMIDE 10 MG/ML IJ SOLN
40.0000 mg | Freq: Once | INTRAMUSCULAR | Status: AC
  Administered 2024-07-24: 40 mg via INTRAVENOUS
  Filled 2024-07-24: qty 4

## 2024-07-24 MED ORDER — FUROSEMIDE 10 MG/ML IJ SOLN: 40.0000 mg | Freq: Two times a day (BID) | INTRAMUSCULAR | Status: DC

## 2024-07-24 MED ORDER — METHYLPREDNISOLONE SODIUM SUCC 40 MG IJ SOLR
20.0000 mg | Freq: Two times a day (BID) | INTRAMUSCULAR | Status: DC
  Administered 2024-07-24 – 2024-07-26 (×5): 20 mg via INTRAVENOUS
  Filled 2024-07-24 (×5): qty 1

## 2024-07-24 NOTE — Progress Notes (Signed)
 Progress Note    [name]  FMW:[id number] DOB: 1960/12/23  DOA: 07/11/2024 PCP: Douglas Geralds, NP      Brief Narrative:    Medical records reviewed and are as summarized below:  [name] is a 63 y.o. male with medical history significant of  GIB, alcohol  abuse, COPD on 2 L oxygen, hypertension, CAD with prior CABG and recent Stent placement,, severe aortic stenosis, HLD, stroke, dementia, chronic diastolic CHF, who presented to the hospital on 07/11/2024 with SOB, cough productive of yellowish sputum, sided chest pain.   Patient was recently hospitalized from 7/16 - 7/17 for elective LHC. Pt was s/p of successful direct stenting of distal SVG to PDA.  CTA negative for PE, showed patchy infiltration in the right upper lobe.  Patient is admitted to telemetry bed as inpatient.    He was admitted to the hospitalist service for COPD exacerbation and acute hypoxic respiratory failure.  He developed worsening respiratory distress shortly after admission.  He was placed on BiPAP and was transferred to the ICU for further management.  He has a became agitated and this was attributed to acute alcohol  withdrawal syndrome.  He was started on IV Precedex  infusion for this. He developed acute toxic metabolic encephalopathy and worsening respiratory failure likely from aspiration pneumonia.  He required intubation and mechanical ventilation in the ICU.  He became hypotensive with induction meds despite IV fluids and IV Levophed  infusion.  There was brief loss of pulse but ROSC was achieved after 2 minutes of CPR and 1 dose of epinephrine .    Significant Hospital Events: Including procedures, antibiotic start and stop dates in addition to other pertinent events   7/25: admit with respiratory failure 7/26: clinically decompensated, worsening respiratory status initiated on BiPAP 7/27: on nasal cannula, reports anxiety and tremors consistent with EtOH withdrawal.  Received a total of 15  mg/kg of phenobarbital , however he remained agitated requiring low dose precedex  gtt.  7/28: Pt resting in bed no agitation on precedex  gtt @0 .5 mcg/min.  Orders placed to transfuse 1 unit of pRBC's hgb 7.2 goal hgb 8.0.  No signs of active bleeding  7/29: Pt remains encephalopathic with severe DT's requiring precedex  gtt.  NGT placed to initiate tube feeds and administer medications  7/30: Earlier this morning with vomiting, concern for aspiration event as now with increased WOB and increasing FiO2 requirements (up to 4L). Remains encephalopathic on Precedex .  Respiratory status continued to decline, progressed to agonal respirations, Required INTUBATION for airway protection.  Hypotensive with induction meds despite IV fluids and Levophed  resulting in brief loss of pulse (ROSC obtained after 2 minutes of CPR and 1 epi).  Central line placed due to vasopressors. 7/31: Overnight with episode on non-sustained SVT which converted spontaneously, remains on Amiodarone  gtt per Cardiology.  Nursing reported black tarry stools overnight, Hgb remains stable.   Levophed  weaned off, on minimal vent settings, will perform WUA and SBT as mental status allows.  With fever of 101 F overnight and WBC nearly doubled, tracheal aspirate results pending, will check blood cultures and UA, broaden to empiric Vancomycin  and Zosyn  pending culture results.   08/01:  No significant events reported overnight. Received 1 unit pRBC's overnight for Hgb 7.5 with appropriate response to 8.8 and has since remained stable with no reports of bleeding from nursing.  Fevers resolved, WBC improved with broadening to Zosyn  yesterday, cultures currently with no growth.  Weaned off levophed .  On minimal vent support, passed WUA and SBT,  EXTUBATED TO HHFNC.  Diuresed with 20 mg IV Lasix  x1 dose with extubation, needs aggressive pulmonary toilet. 8/6: Patient with worsening respiratory status and oxygen requirement this morning, appears more  congested.  Apparently concern of aspiration event yesterday evening when his NG tube was dislodged.  Ordered repeat chest x-ray and sputum culture, worsening leukocytosis and procalcitonin at 0.26.  Starting on cefepime , Mucomyst  and giving 1 dose of IV Lasix .  Currently on 10 L of oxygen 8/7: Worsening respiratory status and increased work of breathing overnight, patient was transferred back to stepdown and started on heated high flow at 70% FiO2. Tube feed was held.  Chest x-ray with unchanged interstitial opacities bilaterally, possibly persistent interstitial edema and underlying emphysematous changes.  Also shows similar appearing lateral right upper lobe zone nodular opacity.  Patient was started on IV diuresis along with extensive pulmonary toileting.  BNP increased to 3252, troponin at 72 with flat curve, MRSA PCR negative PCCM was reconsulted as patient is high risk for reintubation. Palliative care was also consulted.   Assessment/Plan:   Principal Problem:   Acute respiratory distress Active Problems:   COPD exacerbation (HCC)   Lobar pneumonia (HCC)   Elevated lactic acid level   Metabolic acidosis, increased anion gap   Essential hypertension   Acute on chronic diastolic CHF (congestive heart failure) (HCC)   Hyperlipidemia   Stroke (HCC)   CAD (coronary artery disease)   Iron  deficiency anemia   Alcohol  abuse   Cognitive impairment   Myocardial injury   Nutrition Problem: Inadequate oral intake Etiology: lethargy/confusion  Signs/Symptoms: NPO status   Body mass index is 30.44 kg/m.  (Obesity)   Acute on chronic hypoxic respiratory failure: Worsening respiratory status and oxygen requirement requiring transferring back to stepdown and he was placed on heated high flow with FiO2 of 70% PCCM was again consulted Started on IV diuresis - Continue supplemental oxygen-wean as tolerated S/p extubation on 07/18/2024.  Was intubated on 07/16/2024. Patient is currently  high risk for reintubation.  Aspiration pneumonia: Another concern of aspiration yesterday evening with resultant worsening respiratory status. Worsening leukocytosis again.  PCT at 0.26  Completed IV Zosyn  on 07/21/2024. Suspected septic shock: Resolved.  He is off of IV Levophed  infusion.  Repeat chest x-ray with persistent opacities Improving leukocytosis today -Continue with cefepime  -Sputum culture-preliminary with gram-positive cocci-MRSA PCR was negative - Mucomyst  nebulizer due to increased congestion and respiratory secretions. - Aggressive pulmonary toilet - Continue with supportive care  COPD exacerbation: Continue bronchodilators.  Completed steroids.  Acute toxic metabolic encephalopathy: Mental status is improving.  Continue supportive care Alcohol  use disorder with alcohol  withdrawal syndrome: He is off of IV Precedex  infusion.  Continue IV thiamine .  Brief cardiac arrest due to severe hypotension from induction meds on 07/16/2024 (ROSC achieved after 2 minutes of CPR and 1 dose of epinephrine ).  Acute NSTEMI, elevated troponins (peaked at 5,766), CAD, recent elective left heart cath s/p DES to SVG to PDA 07/02/2024,  history of CABG x 3 (May 2013): Continue aspirin , Plavix  and rosuvastatin .  Acute on chronic diastolic CHF, severe aortic stenosis:  2D echo on 05/10/2024 showed EF estimated at 60 to 65%, grade 1 diastolic dysfunction, severe aortic valve stenosis. Repeat BNP shows worsening and now at 3252 - Started on twice daily IV diuresis - Daily weight and BNP - Strict intake and output  Chronic anemia, history of GI bleed: S/p transfusion with 3 unit of PRBCs on 07/21/2024, 7/28 and 07/17/2024 Hemoglobin at 8.1 today- No  evidence of overt GI bleeding thus far.   Continue IV Protonix .  He was evaluated by gastroenterologist on 07/13/2024.  GI signed off. 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 recommended by speech therapist for now.  Continue enteral nutrition via NG tube MBBS was again canceled today due to worsening respiratory status.  Mild hypernatremia: Improved Continue free water  via NG tube.  Continue enteral nutrition.  Monitor BMP.  AKI: Resolved  Hyperglycemia: Glucose levels are stable.  Hemoglobin A1c was 5.3 on 10/28/2022   Comorbidities include hypertension, hyperlipidemia, history of stroke   Diet Order             Diet NPO time specified  Diet effective now                  Consultants: Medical illustrator Cardiologist  Procedures: Intubated on 07/16/2024 and extubated on 07/18/2024 Left IJ central line placed on 07/16/2024  Medications:    amiodarone   400 mg Per Tube BID   Followed by   NOREEN ON 07/28/2024] amiodarone   200 mg Per Tube Daily   ascorbic acid   500 mg Per Tube Daily   aspirin   81 mg Per Tube Daily   budesonide  (PULMICORT ) nebulizer solution  0.5 mg Nebulization BID   Chlorhexidine  Gluconate Cloth  6 each Topical Q0600   cholecalciferol   2,000 Units Per Tube Daily   clopidogrel   75 mg Per Tube Daily   docusate  100 mg Per Tube BID   enoxaparin  (LOVENOX ) injection  40 mg Subcutaneous QHS   folic acid   1 mg Per Tube Daily   free water   100 mL Per Tube Q4H   furosemide   40 mg Intravenous BID   ipratropium-albuterol   3 mL Nebulization Q4H   iron  polysaccharides  150 mg Per Tube Daily   methylPREDNISolone  (SOLU-MEDROL ) injection  20 mg Intravenous BID   multivitamin with minerals  1 tablet Per Tube Daily   mouth rinse  15 mL Mouth Rinse 4 times per day   pantoprazole  (PROTONIX ) IV  40 mg Intravenous Q12H   polyethylene glycol  17 g Per Tube Daily   rosuvastatin   20 mg Per Tube Daily   thiamine   100 mg Per Tube Daily   Continuous Infusions:  feeding supplement (PIVOT 1.5 CAL) 60 mL/hr at 07/24/24 0423     Anti-infectives (From admission, onward)    Start     Dose/Rate Route Frequency Ordered Stop    07/23/24 1400  ceFEPIme  (MAXIPIME ) 2 g in sodium chloride  0.9 % 100 mL IVPB  Status:  Discontinued        2 g 200 mL/hr over 30 Minutes Intravenous Every 8 hours 07/23/24 1203 07/24/24 1028   07/17/24 1000  piperacillin -tazobactam (ZOSYN ) IVPB 3.375 g  Status:  Discontinued        3.375 g 12.5 mL/hr over 240 Minutes Intravenous Every 8 hours 07/17/24 0811 07/21/24 1100   07/17/24 1000  vancomycin  (VANCOREADY) IVPB 2000 mg/400 mL  Status:  Discontinued        2,000 mg 200 mL/hr over 120 Minutes Intravenous  Once 07/17/24 0811 07/17/24 1026   07/14/24 1200  cefTRIAXone  (ROCEPHIN ) 2 g in sodium chloride  0.9 % 100 mL IVPB        2 g 200 mL/hr over 30 Minutes Intravenous Every 24 hours 07/14/24 1030 07/16/24 1204   07/12/24 1600  piperacillin -tazobactam (ZOSYN ) IVPB 3.375 g  Status:  Discontinued  3.375 g 12.5 mL/hr over 240 Minutes Intravenous Every 8 hours 07/12/24 1503 07/14/24 1030   07/12/24 0900  vancomycin  (VANCOREADY) IVPB 1250 mg/250 mL  Status:  Discontinued        1,250 mg 166.7 mL/hr over 90 Minutes Intravenous Every 12 hours 07/11/24 2012 07/12/24 1458   07/11/24 2100  ceFEPIme  (MAXIPIME ) 2 g in sodium chloride  0.9 % 100 mL IVPB  Status:  Discontinued        2 g 200 mL/hr over 30 Minutes Intravenous Every 8 hours 07/11/24 1959 07/12/24 1458   07/11/24 2100  vancomycin  (VANCOREADY) IVPB 2000 mg/400 mL        2,000 mg 200 mL/hr over 120 Minutes Intravenous  Once 07/11/24 2004 07/11/24 2255   07/11/24 1945  cefTRIAXone  (ROCEPHIN ) 1 g in sodium chloride  0.9 % 100 mL IVPB  Status:  Discontinued        1 g 200 mL/hr over 30 Minutes Intravenous  Once 07/11/24 1930 07/11/24 1933   07/11/24 1945  azithromycin  (ZITHROMAX ) 500 mg in sodium chloride  0.9 % 250 mL IVPB  Status:  Discontinued        500 mg 250 mL/hr over 60 Minutes Intravenous  Once 07/11/24 1930 07/11/24 1955   07/11/24 1945  cefTRIAXone  (ROCEPHIN ) 2 g in sodium chloride  0.9 % 100 mL IVPB  Status:  Discontinued         2 g 200 mL/hr over 30 Minutes Intravenous  Once 07/11/24 1933 07/11/24 1955       Family Communication/Anticipated D/C date and plan/Code Status    DVT prophylaxis: Lovenox      Code Status: Full Code  Family Communication: Talked with a friend named Bari Grice listed in his chart.  Apparently she is working on Scientist, water quality, patient has 2 kids but they are not in touch with him.  Disposition Plan: Plan to discharge to SNF   Status is: Inpatient Remains inpatient appropriate because: Acute respiratory failure, aspiration pneumonia   Subjective:  Patient patient feels quite anxious and short of breath when seen today.  A lot of upper respiratory transmitted sounds and secretions.  Very weak cough and unable to clear upper respiratory secretions.  Objective:    Vitals:   07/24/24 1053 07/24/24 1100 07/24/24 1120 07/24/24 1159  BP:  123/71    Pulse:  88    Resp:  18    Temp:      TempSrc:      SpO2: 100% 100% 99% 96%  Weight:      Height:       No data found.   Intake/Output Summary (Last 24 hours) at 07/24/2024 1415 Last data filed at 07/24/2024 1111 Gross per 24 hour  Intake 1200.6 ml  Output 950 ml  Net 250.6 ml   Filed Weights   07/22/24 0242 07/23/24 0500 07/24/24 0500  Weight: 98.6 kg 98.5 kg 101.8 kg    Exam: General.  Ill-appearing gentleman, appears in distress Pulmonary.  Mild scattered rhonchi with a lot of transmitted upper respiratory gurgling sounds, increased work of breathing. CV.  Regular rate and rhythm, no JVD, rub or murmur. Abdomen.  Soft, nontender, nondistended, BS positive. CNS.  Alert and oriented .  No focal neurologic deficit. Extremities.  Trace LE edema pulses intact and symmetrical. Psychiatry.  Judgment and insight appears normal.    Data Reviewed:   I have personally reviewed following labs and imaging studies:  Labs: Labs show the following:   Basic Metabolic Panel: Recent Labs  Lab  07/18/24 0450 07/19/24 0559  07/20/24 0531 07/21/24 0455 07/22/24 0416 07/23/24 0457 07/24/24 0406  NA 143 145 147* 145 146* 142 141  K 3.1* 3.7 4.4 4.0 3.9 4.1 4.3  CL 108 108 109 105 104 101 99  CO2 28 32 29 33* 33* 32 33*  GLUCOSE 140* 167* 178* 193* 186* 138* 160*  BUN 24* 23 27* 33* 36* 37* 48*  CREATININE 1.04 0.85 1.01 1.15 0.97 1.12 1.05  CALCIUM  7.8* 8.0* 8.6* 8.4* 8.6* 8.6* 8.7*  MG 2.4 2.4  --   --   --  2.7*  --   PHOS 2.4* 3.5 3.1 3.4  --  4.6 4.9*   GFR Estimated Creatinine Clearance: 86.6 mL/min (by C-G formula based on SCr of 1.05 mg/dL). Liver Function Tests: Recent Labs  Lab 07/18/24 0450 07/19/24 0559 07/20/24 0531 07/21/24 0455 07/24/24 0406  ALBUMIN 2.6* 2.6* 3.0* 2.7* 2.9*   No results for input(s): LIPASE, AMYLASE in the last 168 hours. No results for input(s): AMMONIA in the last 168 hours.  Coagulation profile No results for input(s): INR, PROTIME in the last 168 hours.  CBC: Recent Labs  Lab 07/20/24 0531 07/21/24 0455 07/22/24 0416 07/23/24 0457 07/24/24 0406  WBC 12.8* 10.5 14.0* 18.8* 15.7*  HGB 9.2* 8.0* 8.6* 9.3* 8.1*  HCT 31.6* 26.9* 28.3* 31.0* 27.5*  MCV 92.9 92.4 91.6 92.5 93.9  PLT 229 211 247 247 246   Cardiac Enzymes: No results for input(s): CKTOTAL, CKMB, CKMBINDEX, TROPONINI in the last 168 hours. BNP (last 3 results) No results for input(s): PROBNP in the last 8760 hours. CBG: Recent Labs  Lab 07/23/24 1917 07/24/24 0018 07/24/24 0430 07/24/24 0713 07/24/24 1126  GLUCAP 115* 147* 146* 116* 140*   D-Dimer: No results for input(s): DDIMER in the last 72 hours. Hgb A1c: No results for input(s): HGBA1C in the last 72 hours. Lipid Profile: No results for input(s): CHOL, HDL, LDLCALC, TRIG, CHOLHDL, LDLDIRECT in the last 72 hours.  Thyroid function studies: No results for input(s): TSH, T4TOTAL, T3FREE, THYROIDAB in the last 72 hours.  Invalid input(s): FREET3 Anemia work up: No results for  input(s): VITAMINB12, FOLATE, FERRITIN, TIBC, IRON , RETICCTPCT in the last 72 hours. Sepsis Labs: Recent Labs  Lab 07/21/24 0455 07/22/24 0416 07/23/24 0457 07/24/24 0406  PROCALCITON  --   --  0.26  --   WBC 10.5 14.0* 18.8* 15.7*    Microbiology Recent Results (from the past 240 hours)  Culture, Respiratory w Gram Stain     Status: None   Collection Time: 07/16/24  4:02 PM   Specimen: Tracheal Aspirate; Respiratory  Result Value Ref Range Status   Specimen Description   Final    TRACHEAL ASPIRATE Performed at [facility], [address] [place], [place] [address]    Special Requests   Final    NONE Performed at [facility], [address] [place], [place] [address]    Gram Stain   Final    RARE WBC PRESENT, PREDOMINANTLY PMN NO ORGANISMS SEEN    Culture   Final    NO GROWTH 2 DAYS Performed at [facility], [address], [place], [place] [address]    Report Status 07/19/2024 FINAL  Final  MRSA Next Gen by PCR, Nasal     Status: None   Collection Time: 07/17/24  8:26 AM   Specimen: Nasal Mucosa; Nasal Swab  Result Value Ref Range Status   MRSA by PCR Next Gen NOT DETECTED  NOT DETECTED Final    Comment: (NOTE) The GeneXpert MRSA Assay (FDA approved for NASAL specimens only), is one component of a comprehensive MRSA colonization surveillance program. It is not intended to diagnose MRSA infection nor to guide or monitor treatment for MRSA infections. Test performance is not FDA approved in patients less than 72 years old. Performed at [facility], [address] [place], [place] [address]   Culture, blood (Routine X 2) w Reflex to ID Panel     Status: None   Collection Time: 07/17/24  9:57 AM   Specimen: BLOOD  Result Value Ref Range Status   Specimen Description BLOOD BLOOD LEFT HAND  Final   Special Requests   Final    BOTTLES DRAWN AEROBIC AND ANAEROBIC Blood Culture adequate volume   Culture   Final     NO GROWTH 5 DAYS Performed at [facility], [address] [place], [place] [address]    Report Status 07/22/2024 FINAL  Final  Culture, blood (Routine X 2) w Reflex to ID Panel     Status: None   Collection Time: 07/17/24  9:57 AM   Specimen: BLOOD  Result Value Ref Range Status   Specimen Description BLOOD BLOOD LEFT WRIST  Final   Special Requests   Final    BOTTLES DRAWN AEROBIC AND ANAEROBIC Blood Culture adequate volume   Culture   Final    NO GROWTH 5 DAYS Performed at [facility], [address] [place], [place] [address]    Report Status 07/22/2024 FINAL  Final  Culture, Respiratory w Gram Stain     Status: None (Preliminary result)   Collection Time: 07/23/24 11:32 AM   Specimen: Sputum  Result Value Ref Range Status   Specimen Description   Final    SPU Performed at [facility], [address] [place], [place] [address]    Special Requests   Final    NONE Performed at [facility], [address] [place], [place] [address]    Gram Stain   Final    RARE WBC PRESENT, PREDOMINANTLY PMN RARE GRAM POSITIVE COCCI    Culture   Final    TOO YOUNG TO READ Performed at [facility], [address], [place], [place] [address]    Report Status PENDING  Incomplete  MRSA Next Gen by PCR, Nasal     Status: None   Collection Time: 07/24/24 11:08 AM   Specimen: Nasal Mucosa; Nasal Swab  Result Value Ref Range Status   MRSA by PCR Next Gen NOT DETECTED NOT DETECTED Final    Comment: (NOTE) The GeneXpert MRSA Assay (FDA approved for NASAL specimens only), is one component of a comprehensive MRSA colonization surveillance program. It is not intended to diagnose MRSA infection nor to guide or monitor treatment for MRSA infections. Test performance is not FDA approved in patients less than 68 years old. Performed at [facility], [address] [place], [place] [address]     Procedures and diagnostic  studies:  DG Chest 1 View Result Date: 07/24/2024 CLINICAL DATA:  [id number] Dyspnea [id number] EXAM: CHEST  1 VIEW COMPARISON:  July 23, 2024, July 11, 2024 FINDINGS: Esophagogastric tube terminates below the diaphragm, outside the field of view. Diffuse interstitial opacities throughout both lungs. No lobar consolidation or pneumothorax. Lateral right upper lobe nodular opacity measures 1.9 cm, corresponding to the lung nodule on the prior chest CT. Moderate cardiomegaly. Sternotomy wires and CABG markers. No acute  fracture or destructive lesions. Multilevel thoracic osteophytosis. IMPRESSION: 1. Moderate cardiomegaly. Unchanged interstitial opacities throughout both lungs, possibly persistent interstitial edema and underlying emphysematous changes. 2. Similarly appearing lateral right upper lung zone nodular opacity. Please see the comparison chest CT for further characterization. Electronically Signed   By: Rogelia Myers M.D.   On: 07/24/2024 08:43   DG Chest Port 1 View Result Date: 07/23/2024 CLINICAL DATA:  Shortness of breath. EXAM: PORTABLE CHEST 1 VIEW COMPARISON:  July 22, 2024. FINDINGS: The heart size and mediastinal contours are within normal limits. Status post coronary bypass graft. Mild diffuse interstitial densities are noted concerning for possible pulmonary edema with possible small pleural effusions. Nasogastric tube is seen entering stomach. The visualized skeletal structures are unremarkable. IMPRESSION: Mild diffuse interstitial densities are noted concerning for possible pulmonary edema with small pleural effusions. Electronically Signed   By: Lynwood Landy Raddle M.D.   On: 07/23/2024 13:03     LOS: 13 days   Amaryllis Dare MD  [facility]   Pager on [email or web address]. If 7PM-7AM, please contact night-coverage at [email or web address]  07/24/2024, 2:15 PM

## 2024-07-24 NOTE — Progress Notes (Signed)
 NAME:  [name], MRN:  [id number], DOB:  [birth year], LOS: 13 ADMISSION DATE:  07/11/2024, CONSULTATION DATE: 07/12/24  Brief Pt Description / Synopsis:  63 y.o. male with PMHx significant for ETOH abuse, HFrEF, CAD with recent PCI, COPD, and GI Bleed initially admitted with Acute Hypoxic Respiratory Failure due to HCAP and COPD Exacerbation.  Hospital course complicated by Acute Metabolic Encephalopathy due to severe Alcohol  Withdrawal and DT's with acute aspiration event requiring intubation for airway protection.   History of Present Illness:  63 year old male who was admitted to the hospital on 7/25 after presenting with shortness of breath and chest discomfort.   He has a known history of severe aortic stenosis as well as coronary artery disease.  He is previously had CABG as well as PCI.  He also has underlying COPD, HFpEF, alcohol  use disorder, and GI bleed.   Patient was recently in the hospital on 7/16 - 7/17 for left heart cath with stenting to bypass graft (SVG to PDA).  He developed shortness of breath with cough productive of sputum over the past few days prompting presentation to the ED yesterday.   On arrival to the ED, his lower extremities were swollen and he was found to have a wheeze.  He was hypoxic requiring oxygen support via nasal cannula.  Given respiratory distress, he was started on treatment for COPD exacerbation and admitted to the hospitalist service for further management.  CT scan of the chest was negative for PE but did note some patchy nodular airspace disease as well as airway thickening.  He was started on broad-spectrum IV antibiotics, steroids, bronchodilators, and received IV fluids for sepsis given elevated lactic acid.   This morning, he had increased shortness of breath with respiratory distress prompting initiation of BiPAP and transferred to the ICU.  He was also started on dexmedetomidine  for agitation.   Blood work showed normal kidney function, but  with rising troponin and a significantly elevated BNP. Hemoglobin has been stable.   Pertinent  Medical History  CAD s/p CABG HFpEF Severe Aortic Stenosis ETOH use COPD on Trelegy  Micro Data:  07/25: COVID>>negative  07/25: Influenza A&B>>negative  07/25: RSV>>negative  07/25: MRSA PCR>>negative  07/25: Blood>>staph epidermidis in aerobic bottle only (likely a contaminant)  07/26: RVP>>negative  07/26: Strep pneumo & Legionella urinary antigens>> negative 7/30: Tracheal aspirate>> 7/31: Blood cultures>> 7/31: MRSA PCR>> negative  Anti-infectives (From admission, onward)    Start     Dose/Rate Route Frequency Ordered Stop   07/23/24 1400  ceFEPIme  (MAXIPIME ) 2 g in sodium chloride  0.9 % 100 mL IVPB        2 g 200 mL/hr over 30 Minutes Intravenous Every 8 hours 07/23/24 1203     07/17/24 1000  piperacillin -tazobactam (ZOSYN ) IVPB 3.375 g  Status:  Discontinued        3.375 g 12.5 mL/hr over 240 Minutes Intravenous Every 8 hours 07/17/24 0811 07/21/24 1100   07/17/24 1000  vancomycin  (VANCOREADY) IVPB 2000 mg/400 mL  Status:  Discontinued        2,000 mg 200 mL/hr over 120 Minutes Intravenous  Once 07/17/24 0811 07/17/24 1026   07/14/24 1200  cefTRIAXone  (ROCEPHIN ) 2 g in sodium chloride  0.9 % 100 mL IVPB        2 g 200 mL/hr over 30 Minutes Intravenous Every 24 hours 07/14/24 1030 07/16/24 1204   07/12/24 1600  piperacillin -tazobactam (ZOSYN ) IVPB 3.375 g  Status:  Discontinued        3.375  g 12.5 mL/hr over 240 Minutes Intravenous Every 8 hours 07/12/24 1503 07/14/24 1030   07/12/24 0900  vancomycin  (VANCOREADY) IVPB 1250 mg/250 mL  Status:  Discontinued        1,250 mg 166.7 mL/hr over 90 Minutes Intravenous Every 12 hours 07/11/24 2012 07/12/24 1458   07/11/24 2100  ceFEPIme  (MAXIPIME ) 2 g in sodium chloride  0.9 % 100 mL IVPB  Status:  Discontinued        2 g 200 mL/hr over 30 Minutes Intravenous Every 8 hours 07/11/24 1959 07/12/24 1458   07/11/24 2100  vancomycin   (VANCOREADY) IVPB 2000 mg/400 mL        2,000 mg 200 mL/hr over 120 Minutes Intravenous  Once 07/11/24 2004 07/11/24 2255   07/11/24 1945  cefTRIAXone  (ROCEPHIN ) 1 g in sodium chloride  0.9 % 100 mL IVPB  Status:  Discontinued        1 g 200 mL/hr over 30 Minutes Intravenous  Once 07/11/24 1930 07/11/24 1933   07/11/24 1945  azithromycin  (ZITHROMAX ) 500 mg in sodium chloride  0.9 % 250 mL IVPB  Status:  Discontinued        500 mg 250 mL/hr over 60 Minutes Intravenous  Once 07/11/24 1930 07/11/24 1955   07/11/24 1945  cefTRIAXone  (ROCEPHIN ) 2 g in sodium chloride  0.9 % 100 mL IVPB  Status:  Discontinued        2 g 200 mL/hr over 30 Minutes Intravenous  Once 07/11/24 1933 07/11/24 1955      Significant Hospital Events: Including procedures, antibiotic start and stop dates in addition to other pertinent events   7/25: admit with respiratory failure 7/26: clinically decompensated, worsening respiratory status initiated on BiPAP 7/27: on nasal cannula, reports anxiety and tremors consistent with EtOH withdrawal.  Received a total of 15 mg/kg of phenobarbital , however he remained agitated requiring low dose precedex  gtt.  7/28: Pt resting in bed no agitation on precedex  gtt @0 .5 mcg/min.  Orders placed to transfuse 1 unit of pRBC's hgb 7.2 goal hgb 8.0.  No signs of active bleeding  7/29: Pt remains encephalopathic with severe DT's requiring precedex  gtt.  NGT placed to initiate tube feeds and administer medications  7/30: Earlier this morning with vomiting, concern for aspiration event as now with increased WOB and increasing FiO2 requirements (up to 4L). Remains encephalopathic on Precedex .  Respiratory status continued to decline, progressed to agonal respirations, Required INTUBATION for airway protection.  Hypotensive with induction meds despite IV fluids and Levophed  resulting in brief loss of pulse (ROSC obtained after 2 minutes of CPR and 1 epi).  Central line placed due to vasopressors. 7/31:  Overnight with episode on non-sustained SVT which converted spontaneously, remains on Amiodarone  gtt per Cardiology.  Nursing reported black tarry stools overnight, Hgb remains stable.   Levophed  weaned off, on minimal vent settings, will perform WUA and SBT as mental status allows.  With fever of 101 F overnight and WBC nearly doubled, tracheal aspirate results pending, will check blood cultures and UA, broaden to empiric Vancomycin  and Zosyn  pending culture results.   08/01:  No significant events reported overnight. Received 1 unit pRBC's overnight for Hgb 7.5 with appropriate response to 8.8 and has since remained stable with no reports of bleeding from nursing.  Fevers resolved, WBC improved with broadening to Zosyn  yesterday, cultures currently with no growth.  Weaned off levophed .  On minimal vent support, passed WUA and SBT, EXTUBATED TO HHFNC.  Diuresed with 20 mg IV Lasix  x1 dose with extubation,  needs aggressive pulmonary toilet. 8/2-8/5 progressive SOB and high risk for aspiration 8/7 placed on high flow  70%  Interim History / Subjective:  High risk for intubation Alert and awake Slight increased WOB NG in place   Objective    Blood pressure 119/75, pulse 84, temperature 97.6 F (36.4 C), temperature source Oral, resp. rate 14, height 6' (1.829 m), weight 101.8 kg, SpO2 100%.    FiO2 (%):  [60 %] 60 %   Intake/Output Summary (Last 24 hours) at 07/24/2024 9277 Last data filed at 07/24/2024 0600 Gross per 24 hour  Intake 1447.23 ml  Output 1000 ml  Net 447.23 ml   Filed Weights   07/22/24 0242 07/23/24 0500 07/24/24 0500  Weight: 98.6 kg 98.5 kg 101.8 kg     REVIEW OF SYSTEMS +WOB and SOB  PHYSICAL EXAMINATION:  GENERAL:critically ill appearing, +resp distress EYES: Pupils equal, round, reactive to light.  No scleral icterus.  MOUTH: Moist mucosal membrane. NECK: Supple.  PULMONARY: Lungs clear to auscultation, +rhonchi CARDIOVASCULAR: S1 and S2.  Regular rate and  rhythm GASTROINTESTINAL: Soft, nontender, -distended. Positive bowel sounds.  MUSCULOSKELETAL: No swelling, clubbing, or edema.  NEUROLOGIC: alert and awake SKIN:normal, warm to touch, Capillary refill delayed  Pulses present bilaterally   Assessment and Plan   63 yo white male admitted for acute COPD exacerbation with acute aspiration pneumonia  in the setting of severe ametabolic encephalopathy due to alcohol  withdrawal leading to severe hypoxic resp failure complicated by cardiac arrest with  intubation, s/p extubation developing progressive resp failure with signs and symptoms of aspiration   Severe ACUTE Hypoxic and Hypercapnic Respiratory Failure -Wean Fio2  - Intermittent chest x-ray & ABG PRN - Ensure adequate pulmonary hygiene  High risk for intubation   Acute toxic metabolic encephalopathy  ETOH withdrawal  Slowly resolving  -Avoid sedating medications as able  CARDIAC Brief Cardiac Arrest due to severe Hypotension from induction meds (ROSC obtained after 2 minutes of CPR and 1 epi) Severe aortic stenosis  Elevated troponin's secondary to NSTEMI  Acute on Chronic HFpEF  Hx: HTN, MI, CAD, and stroke  -Echocardiogram 05/10/24: LVEF 60-65%, grade I DD, RV systolic function and size normal, severe Aortic Stenosis -Cardiac Cath 07/02/24: Severe native 3 vessel disease, successful stenting of distal SVG to PDA with DES -Continuous cardiac monitoring -Maintain MAP >65 -Lactic acid has normalized - Cardiology following, appreciate input ~ pt to follow-up outpatient to discuss aortic valve repair/replacement options.  Goal is to optimize him regarding volume and symptom standpoint prior to discharge  - Continue aspirin , plavix , and rosuvastatin   -Amiodarone  gtt transitioned to PO Amiodarone  on 8/1  Aspiration pneumonia  -Monitor fever curve -Trend WBC's & Procalcitonin -Follow cultures as above Antibiotics Given (last 72 hours)     Date/Time Action Medication Dose Rate    07/21/24 1005 New Bag/Given   piperacillin -tazobactam (ZOSYN ) IVPB 3.375 g 3.375 g 12.5 mL/hr   07/23/24 1339 New Bag/Given   ceFEPIme  (MAXIPIME ) 2 g in sodium chloride  0.9 % 100 mL IVPB 2 g 200 mL/hr   07/23/24 2201 New Bag/Given   ceFEPIme  (MAXIPIME ) 2 g in sodium chloride  0.9 % 100 mL IVPB 2 g 200 mL/hr   07/24/24 0543 New Bag/Given   ceFEPIme  (MAXIPIME ) 2 g in sodium chloride  0.9 % 100 mL IVPB 2 g 200 mL/hr      RENAL -continue Foley Catheter-assess need -Avoid nephrotoxic agents -Follow urine output, BMP -Ensure adequate renal perfusion, optimize oxygenation -Renal dose medications   Intake/Output  Summary (Last 24 hours) at 07/24/2024 0732 Last data filed at 07/24/2024 0600 Gross per 24 hour  Intake 1447.23 ml  Output 1000 ml  Net 447.23 ml      Latest Ref Rng & Units 07/24/2024    4:06 AM 07/23/2024    4:57 AM 07/22/2024    4:16 AM  BMP  Glucose 70 - 99 mg/dL 839  861  813   BUN 8 - 23 mg/dL 48  37  36   Creatinine 0.61 - 1.24 mg/dL 8.94  8.87  9.02   Sodium 135 - 145 mmol/L 141  142  146   Potassium 3.5 - 5.1 mmol/L 4.3  4.1  3.9   Chloride 98 - 111 mmol/L 99  101  104   CO2 22 - 32 mmol/L 33  32  33   Calcium  8.9 - 10.3 mg/dL 8.7  8.6  8.6     Anemia without signs of bleeding  Possible GI bleed  Hx: AVM's undergone multiple bidrectional endoscopies without findings of bleeding within the last 2 yrs  - Trend CBC  - Monitor for s/sx of bleeding  - Transfuse for hgb <8  (received 1 unit pRBC on 7/31) - Continue scheduled niferex  - GI consulted appreciate input: due to no signs of active bleeding no indications for interventions at this time signed off   -Continue Protonix  BID   ENDO - ICU hypoglycemic\Hyperglycemia protocol -check FSBS per protocol   GI GI PROPHYLAXIS as indicated NUTRITIONAL STATUS DIET-->NGT in place TF's as tolerated Constipation protocol as indicated   ELECTROLYTES -follow labs as needed -replace as needed -pharmacy consultation and  following     Best Practice (right click and Reselect all SmartList Selections daily)   Diet/type: NPO; Tube feeds via NGT until passes speech evaluation DVT prophylaxis Lovenox  Pressure ulcer(s): N/A GI prophylaxis: PPI Lines: Left internal jugular CVC placed 7/39, and is still needed Foley:  yes and is still needed Code Status:  full code Last date of multidisciplinary goals of care discussion [07/18/24]   Labs   CBC: Recent Labs  Lab 07/20/24 0531 07/21/24 0455 07/22/24 0416 07/23/24 0457 07/24/24 0406  WBC 12.8* 10.5 14.0* 18.8* 15.7*  HGB 9.2* 8.0* 8.6* 9.3* 8.1*  HCT 31.6* 26.9* 28.3* 31.0* 27.5*  MCV 92.9 92.4 91.6 92.5 93.9  PLT 229 211 247 247 246    Basic Metabolic Panel: Recent Labs  Lab 07/18/24 0450 07/19/24 0559 07/20/24 0531 07/21/24 0455 07/22/24 0416 07/23/24 0457 07/24/24 0406  NA 143 145 147* 145 146* 142 141  K 3.1* 3.7 4.4 4.0 3.9 4.1 4.3  CL 108 108 109 105 104 101 99  CO2 28 32 29 33* 33* 32 33*  GLUCOSE 140* 167* 178* 193* 186* 138* 160*  BUN 24* 23 27* 33* 36* 37* 48*  CREATININE 1.04 0.85 1.01 1.15 0.97 1.12 1.05  CALCIUM  7.8* 8.0* 8.6* 8.4* 8.6* 8.6* 8.7*  MG 2.4 2.4  --   --   --  2.7*  --   PHOS 2.4* 3.5 3.1 3.4  --  4.6 4.9*   GFR: Estimated Creatinine Clearance: 86.6 mL/min (by C-G formula based on SCr of 1.05 mg/dL). Recent Labs  Lab 07/21/24 0455 07/22/24 0416 07/23/24 0457 07/24/24 0406  PROCALCITON  --   --  0.26  --   WBC 10.5 14.0* 18.8* 15.7*    Liver Function Tests: Recent Labs  Lab 07/18/24 0450 07/19/24 0559 07/20/24 0531 07/21/24 0455 07/24/24 0406  ALBUMIN 2.6* 2.6*  3.0* 2.7* 2.9*   No results for input(s): LIPASE, AMYLASE in the last 168 hours. No results for input(s): AMMONIA in the last 168 hours.   ABG    Component Value Date/Time   PHART 7.47 (H) 07/18/2024 0347   PCO2ART 43 07/18/2024 0347   PO2ART 86 07/18/2024 0347   HCO3 31.3 (H) 07/18/2024 0347   ACIDBASEDEF 0.8  07/16/2024 0915   O2SAT 98.2 07/18/2024 0347     Coagulation Profile: No results for input(s): INR, PROTIME in the last 168 hours.   Cardiac Enzymes: No results for input(s): CKTOTAL, CKMB, CKMBINDEX, TROPONINI in the last 168 hours.  HbA1C: Hemoglobin A1C  Date/Time Value Ref Range Status  12/02/2013 03:52 PM 5.3 4.2 - 6.3 % Final    Comment:    The American Diabetes Association recommends that a primary goal of therapy should be <7% and that physicians should reevaluate the treatment regimen in patients with HbA1c values consistently >8%.    Hgb A1c MFr Bld  Date/Time Value Ref Range Status  10/28/2022 04:37 AM 5.3 4.8 - 5.6 % Final    Comment:    (NOTE) Pre diabetes:          5.7%-6.4%  Diabetes:              >6.4%  Glycemic control for   <7.0% adults with diabetes     CBG: Recent Labs  Lab 07/23/24 1534 07/23/24 1917 07/24/24 0018 07/24/24 0430 07/24/24 0713  GLUCAP 103* 115* 147* 146* 116*    Review of Systems:   Unable to assess pt sedated/AMS/Critical illness  Past Medical History:  He,  has a past medical history of COPD (chronic obstructive pulmonary disease) (HCC), CVA (cerebral infarction), Headache, Hypertension, MI (myocardial infarction) (HCC) (05/09/2012), Reading difficulty, Stroke (HCC) (2012), and Wears dentures.   Surgical History:   Past Surgical History:  Procedure Laterality Date   CAROTID ENDARTERECTOMY Left    2012 or 2013   CHOLECYSTECTOMY N/A 07/26/2016   Procedure: LAPAROSCOPIC CHOLECYSTECTOMY;  Surgeon: Charlie FORBES Fell, MD;  Location: [facility] ORS;  Service: General;  Laterality: N/A;   COLONOSCOPY N/A 05/08/2024   Procedure: COLONOSCOPY;  Surgeon: Jinny Carmine, MD;  Location: [facility] ENDOSCOPY;  Service: Endoscopy;  Laterality: N/A;   COLONOSCOPY WITH PROPOFOL  N/A 02/09/2022   Procedure: COLONOSCOPY WITH PROPOFOL ;  Surgeon: Jinny Carmine, MD;  Location: [facility] ENDOSCOPY;  Service: Endoscopy;  Laterality: N/A;   CORONARY  ARTERY BYPASS GRAFT  05/10/2012   3 vessel   CORONARY STENT INTERVENTION N/A 07/02/2024   Procedure: CORONARY STENT INTERVENTION;  Surgeon: Katina Albright, MD;  Location: [facility] INVASIVE CV LAB;  Service: Cardiovascular;  Laterality: N/A;   CORONARY ULTRASOUND/IVUS N/A 07/02/2024   Procedure: Coronary Ultrasound/IVUS;  Surgeon: Katina Albright, MD;  Location: [facility] INVASIVE CV LAB;  Service: Cardiovascular;  Laterality: N/A;   ENTEROSCOPY N/A 05/16/2023   Procedure: ENTEROSCOPY;  Surgeon: Therisa Bi, MD;  Location: [facility] ENDOSCOPY;  Service: Gastroenterology;  Laterality: N/A;   ENTEROSCOPY N/A 06/01/2023   Procedure: ENTEROSCOPY;  Surgeon: Unk Corinn Skiff, MD;  Location: [facility] ENDOSCOPY;  Service: Gastroenterology;  Laterality: N/A;   ESOPHAGOGASTRODUODENOSCOPY  05/13/2023   Procedure: ESOPHAGOGASTRODUODENOSCOPY (EGD);  Surgeon: Jinny Carmine, MD;  Location: [facility] ENDOSCOPY;  Service: Endoscopy;;   ESOPHAGOGASTRODUODENOSCOPY (EGD) WITH PROPOFOL  N/A 10/28/2022   Procedure: ESOPHAGOGASTRODUODENOSCOPY (EGD) WITH PROPOFOL ;  Surgeon: Onita Elspeth Sharper, DO;  Location[facility] ENDOSCOPY;  Service: Gastroenterology;  Laterality: N/A;   GIVENS CAPSULE STUDY  05/13/2023   Procedure: GIVENS CAPSULE STUDY;  Surgeon:  Jinny Carmine, MD;  Location: [facility] ENDOSCOPY;  Service: Endoscopy;;   GIVENS CAPSULE STUDY  06/01/2023   Procedure: GIVENS CAPSULE STUDY;  Surgeon: Unk Corinn Skiff, MD;  Location: [facility] ENDOSCOPY;  Service: Gastroenterology;;   LEFT HEART CATH AND CORS/GRAFTS ANGIOGRAPHY N/A 07/02/2024   Procedure: LEFT HEART CATH AND CORS/GRAFTS ANGIOGRAPHY;  Surgeon: Katina Albright, MD;  Location: [facility] INVASIVE CV LAB;  Service: Cardiovascular;  Laterality: N/A;     Social History:   reports that he quit smoking about 32 years ago. His smoking use included cigarettes. His smokeless tobacco use includes snuff. He reports current alcohol  use of about 26.0 standard drinks of alcohol  per week. He reports that he does not use  drugs.   Family History:  His family history includes Pneumonia in his mother.   Allergies Allergies  Allergen Reactions   Wellbutrin  [Bupropion ]     Paranoia      Home Medications  Prior to Admission medications   Medication Sig Start Date End Date Taking? Authorizing Provider  albuterol  (VENTOLIN  HFA) 108 (90 Base) MCG/ACT inhaler Inhale 2 puffs into the lungs every 6 (six) hours as needed for wheezing or shortness of breath. 07/03/24  Yes Von Bellis, MD  ascorbic acid  (VITAMIN C ) 500 MG tablet Take 1 tablet (500 mg total) by mouth daily. 07/03/24 10/01/24 Yes Von Bellis, MD  aspirin  EC 81 MG tablet Take 1 tablet (81 mg total) by mouth daily. Swallow whole. 07/03/24 12/30/24 Yes Von Bellis, MD  clopidogrel  (PLAVIX ) 75 MG tablet Take 1 tablet (75 mg total) by mouth daily with breakfast. 07/03/24  Yes Von Bellis, MD  dapagliflozin  propanediol (FARXIGA ) 10 MG TABS tablet Take 1 tablet (10 mg total) by mouth daily. 07/03/24  Yes Von Bellis, MD  folic acid  (FOLVITE ) 1 MG tablet Take 1 tablet (1 mg total) by mouth daily. 07/03/24 10/01/24 Yes Von Bellis, MD  iron  polysaccharides (NIFEREX) 150 MG capsule Take 1 capsule (150 mg total) by mouth daily. 07/03/24 10/01/24 Yes Von Bellis, MD  isosorbide  mononitrate (IMDUR ) 60 MG 24 hr tablet Take 1 tablet (60 mg total) by mouth daily. 07/03/24 07/03/25 Yes Von Bellis, MD  losartan  (COZAAR ) 25 MG tablet Take 0.5 tablets (12.5 mg total) by mouth daily. 07/04/24 07/04/25 Yes Von Bellis, MD  metoprolol  succinate (TOPROL -XL) 25 MG 24 hr tablet Take 0.5 tablets (12.5 mg total) by mouth daily. 07/03/24  Yes Von Bellis, MD  pantoprazole  (PROTONIX ) 40 MG tablet Take 1 tablet (40 mg total) by mouth daily. 07/03/24 10/01/24 Yes Von Bellis, MD  pravastatin  (PRAVACHOL ) 20 MG tablet Take 1 tablet (20 mg total) by mouth at bedtime. 07/03/24 07/03/25 Yes Von Bellis, MD  spironolactone  (ALDACTONE ) 25 MG tablet Take 0.5 tablets (12.5 mg total) by  mouth daily. 07/03/24  Yes Von Bellis, MD  torsemide  (DEMADEX ) 10 MG tablet Take 1 tablet (10 mg total) by mouth daily. 07/03/24  Yes Von Bellis, MD      DVT/GI PRX  assessed I Assessed the need for Labs I Assessed the need for Foley I Assessed the need for Central Venous Line I Assessed the need for Mobilization I made an Assessment of medications to be adjusted accordingly Safety Risk assessment completed  CASE DISCUSSED IN MULTIDISCIPLINARY ROUNDS WITH ICU TEAM     Critical Care Time devoted to patient care services described in this note is 75 minutes.  Critical care was necessary to treat /prevent imminent and life-threatening deterioration. Overall, patient is critically ill, prognosis is guarded.  Patient with Multiorgan  failure and at high risk for cardiac arrest and death.    Nickolas Alm Cellar, M.D.  [facility]  Medical Director [facility] Medical Director [facility] Cardio-Pulmonary Department

## 2024-07-24 NOTE — TOC CM/SW Note (Signed)
..  Transition of Care [facility]) - Inpatient Brief Assessment   Patient Details  Name: Kirtis Challis MRN: [id number] Date of Birth: 08/18/1961  Transition of Care [facility]) CM/SW Contact:    Edsel DELENA Fischer, LCSW Phone Number: 07/24/2024, 3:10 PM   Clinical Narrative:  SW messaged Dr. Caleen and PTA- Darice Bohr if pt is a  [facility]? Waiting on response  Transition of Care Asessment:

## 2024-07-24 NOTE — Progress Notes (Signed)
 [facility] CLINIC CARDIOLOGY PROGRESS NOTE       Patient ID: [name] MRN: [id number] DOB/AGE: [birth year] 63 y.o.  Admit date: 07/11/2024 Referring Physician Dr. Charlie Sellar Primary Physician Osa Geralds, NP  Primary Cardiologist Tinnie Maiden, NP  Reason for Consultation elevated troponins  HPI: [name] is a 63 y.o. 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 Found to have pneumonia, overnight troponins uptrending. Cardiology was consulted for further evaluation.   Interval history: -Patient seen and examined this AM, resting in bed. Patient states SOB doesn't feel good. Patient has increased O2 requirement to HHFNC 50.  -Reported to have chest tightness early this AM, trops and EKG repeated. Trops minimally elevated and flat 72 > 75. EKG without acute ischemic changes. Patient states his chest tightness is a/w with deep breathing and his SOB.  -Patient BP and HR stable this AM. No significant events on telemetry.  -Bun:Cr ratio 48:1, concerning for pre-renal azotemia, possibly related to fluid overload with bibasilar crackles on exam, trace LEE.  -Patient remains NPO. NGT in place -Leukocytosis at 18.8 > 15.  Review of systems complete and found to be negative unless listed above    Past Medical History:  Diagnosis Date   COPD (chronic obstructive pulmonary disease) (HCC)    CVA (cerebral infarction)    Headache    mild, since stroke 2012   Hypertension    MI (myocardial infarction) (HCC) 05/09/2012   Reading difficulty    pt reports he reads at about a second grade level   Stroke [facility]) 2012   numbness to left hand    Wears  dentures    full upper and lower    Past Surgical History:  Procedure Laterality Date   CAROTID ENDARTERECTOMY Left    2012 or 2013   CHOLECYSTECTOMY N/A 07/26/2016   Procedure: LAPAROSCOPIC CHOLECYSTECTOMY;  Surgeon: Charlie FORBES Fell, MD;  Location: [facility] ORS;  Service: General;  Laterality: N/A;   COLONOSCOPY N/A 05/08/2024   Procedure: COLONOSCOPY;  Surgeon: Jinny Carmine, MD;  Location: [facility] ENDOSCOPY;  Service: Endoscopy;  Laterality: N/A;   COLONOSCOPY WITH PROPOFOL  N/A 02/09/2022   Procedure: COLONOSCOPY WITH PROPOFOL ;  Surgeon: Jinny Carmine, MD;  Location: [facility] ENDOSCOPY;  Service: Endoscopy;  Laterality: N/A;   CORONARY ARTERY BYPASS GRAFT  05/10/2012   3 vessel   CORONARY STENT INTERVENTION N/A 07/02/2024   Procedure: CORONARY STENT INTERVENTION;  Surgeon: Katina Albright, MD;  Location: [facility] INVASIVE CV LAB;  Service: Cardiovascular;  Laterality: N/A;   CORONARY ULTRASOUND/IVUS N/A 07/02/2024   Procedure: Coronary Ultrasound/IVUS;  Surgeon: Katina Albright, MD;  Location: [facility] INVASIVE CV LAB;  Service: Cardiovascular;  Laterality: N/A;   ENTEROSCOPY N/A 05/16/2023   Procedure: ENTEROSCOPY;  Surgeon: Therisa Bi, MD;  Location: [facility] ENDOSCOPY;  Service: Gastroenterology;  Laterality: N/A;   ENTEROSCOPY N/A 06/01/2023   Procedure: ENTEROSCOPY;  Surgeon: Unk Corinn Skiff, MD;  Location: [facility] ENDOSCOPY;  Service: Gastroenterology;  Laterality: N/A;   ESOPHAGOGASTRODUODENOSCOPY  05/13/2023   Procedure: ESOPHAGOGASTRODUODENOSCOPY (EGD);  Surgeon: Jinny Carmine, MD;  Location: [facility] ENDOSCOPY;  Service: Endoscopy;;   ESOPHAGOGASTRODUODENOSCOPY (EGD) WITH PROPOFOL  N/A 10/28/2022   Procedure: ESOPHAGOGASTRODUODENOSCOPY (EGD) WITH PROPOFOL ;  Surgeon:  Onita Elspeth Sharper, DO;  Location: [facility] ENDOSCOPY;  Service: Gastroenterology;  Laterality: N/A;   GIVENS CAPSULE STUDY  05/13/2023   Procedure: GIVENS CAPSULE STUDY;  Surgeon: Jinny Carmine, MD;  Location: [facility] ENDOSCOPY;  Service: Endoscopy;;   GIVENS  CAPSULE STUDY  06/01/2023   Procedure: GIVENS CAPSULE STUDY;  Surgeon: Unk Corinn Skiff, MD;  Location: [facility] ENDOSCOPY;  Service: Gastroenterology;;   LEFT HEART CATH AND CORS/GRAFTS ANGIOGRAPHY N/A 07/02/2024   Procedure: LEFT HEART CATH AND CORS/GRAFTS ANGIOGRAPHY;  Surgeon: Katina Albright, MD;  Location: [facility] INVASIVE CV LAB;  Service: Cardiovascular;  Laterality: N/A;    Medications Prior to Admission  Medication Sig Dispense Refill Last Dose/Taking   albuterol  (VENTOLIN  HFA) 108 (90 Base) MCG/ACT inhaler Inhale 2 puffs into the lungs every 6 (six) hours as needed for wheezing or shortness of breath. 6.7 g 2 07/11/2024 Morning   ascorbic acid  (VITAMIN C ) 500 MG tablet Take 1 tablet (500 mg total) by mouth daily. 30 tablet 2 07/11/2024 Morning   aspirin  EC 81 MG tablet Take 1 tablet (81 mg total) by mouth daily. Swallow whole. 30 tablet 5 07/11/2024 Morning   clopidogrel  (PLAVIX ) 75 MG tablet Take 1 tablet (75 mg total) by mouth daily with breakfast. 30 tablet 11 07/11/2024 Morning   dapagliflozin  propanediol (FARXIGA ) 10 MG TABS tablet Take 1 tablet (10 mg total) by mouth daily. 30 tablet 11 07/11/2024 Morning   folic acid  (FOLVITE ) 1 MG tablet Take 1 tablet (1 mg total) by mouth daily. 30 tablet 2 07/11/2024   iron  polysaccharides (NIFEREX) 150 MG capsule Take 1 capsule (150 mg total) by mouth daily. 30 capsule 2 07/11/2024   isosorbide  mononitrate (IMDUR ) 60 MG 24 hr tablet Take 1 tablet (60 mg total) by mouth daily. 30 tablet 11 07/11/2024   losartan  (COZAAR ) 25 MG tablet Take 0.5 tablets (12.5 mg total) by mouth daily. 30 tablet 11 07/11/2024 Morning   metoprolol  succinate (TOPROL -XL) 25 MG 24 hr tablet Take 0.5 tablets (12.5 mg total) by mouth daily. 30 tablet 11 07/11/2024 Morning   pantoprazole  (PROTONIX ) 40 MG tablet Take 1 tablet (40 mg total) by mouth daily. 30 tablet 2 07/11/2024 Morning   pravastatin  (PRAVACHOL ) 20 MG tablet Take 1 tablet (20 mg total) by mouth at bedtime. 30 tablet 11 07/10/2024  Evening   spironolactone  (ALDACTONE ) 25 MG tablet Take 0.5 tablets (12.5 mg total) by mouth daily. 30 tablet 11 07/11/2024 Morning   torsemide  (DEMADEX ) 10 MG tablet Take 1 tablet (10 mg total) by mouth daily. 30 tablet 11 07/11/2024   Social History   Socioeconomic History   Marital status: Divorced    Spouse name: Not on file   Number of children: Not on file   Years of education: Not on file   Highest education level: Not on file  Occupational History   Not on file  Tobacco Use   Smoking status: Former    Current packs/day: 0.00    Types: Cigarettes    Quit date: 41    Years since quitting: 32.6   Smokeless tobacco: Current    Types: Snuff   Tobacco comments:    changed to dip  Vaping Use   Vaping status: Never Used  Substance and Sexual Activity   Alcohol  use: Yes    Alcohol /week: 26.0 standard drinks of alcohol     Types: 26 Cans of beer per week   Drug use: No   Sexual activity: Yes    Birth control/protection: None  Other Topics Concern  Not on file  Social History Narrative   Not on file   Social Drivers of Health   Financial Resource Strain: Not on file  Food Insecurity: No Food Insecurity (07/12/2024)   Hunger Vital Sign    Worried About Running Out of Food in the Last Year: Never true    Ran Out of Food in the Last Year: Never true  Transportation Needs: No Transportation Needs (07/12/2024)   PRAPARE - Administrator, Civil Service (Medical): No    Lack of Transportation (Non-Medical): No  Physical Activity: Not on file  Stress: Not on file  Social Connections: Socially Isolated (07/12/2024)   Social Connection and Isolation Panel    Frequency of Communication with Friends and Family: Once a week    Frequency of Social Gatherings with Friends and Family: Once a week    Attends Religious Services: Never    Database administrator or Organizations: No    Attends Banker Meetings: Never    Marital Status: Divorced  Careers information officer Violence: Not At Risk (07/12/2024)   Humiliation, Afraid, Rape, and Kick questionnaire    Fear of Current or Ex-Partner: No    Emotionally Abused: No    Physically Abused: No    Sexually Abused: No    Family History  Problem Relation Age of Onset   Pneumonia Mother      Vitals:   07/24/24 0600 07/24/24 0700 07/24/24 0740 07/24/24 0800  BP: 128/73 119/75  131/71  Pulse: 87 84  91  Resp: 19 14  18   Temp:    97.8 F (36.6 C)  TempSrc:    Oral  SpO2: 100% 100% 99% 100%  Weight:      Height:        PHYSICAL EXAM General: Ill appearing male, no acute distress. HEENT: Normocephalic and atraumatic. Neck: No JVD.  Lungs: Increased respiratory effort.  Diminished breath sounds with bibasilar crackles. Heart: HRRR, Normal S1 and S2 without gallops. +systolic ejection murmur.   Abdomen: Non-distended appearing.  Msk: Normal strength and tone for age. Extremities: Warm and well perfused. No clubbing, cyanosis, trace pedal edema.   Labs: Basic Metabolic Panel: Recent Labs    07/23/24 0457 07/24/24 0406  NA 142 141  K 4.1 4.3  CL 101 99  CO2 32 33*  GLUCOSE 138* 160*  BUN 37* 48*  CREATININE 1.12 1.05  CALCIUM  8.6* 8.7*  MG 2.7*  --   PHOS 4.6 4.9*   Liver Function Tests: Recent Labs    07/24/24 0406  ALBUMIN 2.9*   No results for input(s): LIPASE, AMYLASE in the last 72 hours. CBC: Recent Labs    07/23/24 0457 07/24/24 0406  WBC 18.8* 15.7*  HGB 9.3* 8.1*  HCT 31.0* 27.5*  MCV 92.5 93.9  PLT 247 246   Cardiac Enzymes: Recent Labs    07/24/24 0406 07/24/24 0718  TROPONINIHS 72* 75*     BNP: No results for input(s): BNP in the last 72 hours.  D-Dimer: No results for input(s): DDIMER in the last 72 hours. Hemoglobin A1C: No results for input(s): HGBA1C in the last 72 hours. Fasting Lipid Panel: No results for input(s): CHOL, HDL, LDLCALC, TRIG, CHOLHDL, LDLDIRECT in the last 72 hours.  Thyroid Function Tests: No  results for input(s): TSH, T4TOTAL, T3FREE, THYROIDAB in the last 72 hours.  Invalid input(s): FREET3 Anemia Panel: No results for input(s): VITAMINB12, FOLATE, FERRITIN, TIBC, IRON , RETICCTPCT in the last 72 hours.   Radiology:  DG Chest 1 View Result Date: 07/24/2024 CLINICAL DATA:  [id number] Dyspnea [id number] EXAM: CHEST  1 VIEW COMPARISON:  July 23, 2024, July 11, 2024 FINDINGS: Esophagogastric tube terminates below the diaphragm, outside the field of view. Diffuse interstitial opacities throughout both lungs. No lobar consolidation or pneumothorax. Lateral right upper lobe nodular opacity measures 1.9 cm, corresponding to the lung nodule on the prior chest CT. Moderate cardiomegaly. Sternotomy wires and CABG markers. No acute fracture or destructive lesions. Multilevel thoracic osteophytosis. IMPRESSION: 1. Moderate cardiomegaly. Unchanged interstitial opacities throughout both lungs, possibly persistent interstitial edema and underlying emphysematous changes. 2. Similarly appearing lateral right upper lung zone nodular opacity. Please see the comparison chest CT for further characterization. Electronically Signed   By: Rogelia Myers M.D.   On: 07/24/2024 08:43   DG Chest Port 1 View Result Date: 07/23/2024 CLINICAL DATA:  Shortness of breath. EXAM: PORTABLE CHEST 1 VIEW COMPARISON:  July 22, 2024. FINDINGS: The heart size and mediastinal contours are within normal limits. Status post coronary bypass graft. Mild diffuse interstitial densities are noted concerning for possible pulmonary edema with possible small pleural effusions. Nasogastric tube is seen entering stomach. The visualized skeletal structures are unremarkable. IMPRESSION: Mild diffuse interstitial densities are noted concerning for possible pulmonary edema with small pleural effusions. Electronically Signed   By: Lynwood Landy Raddle M.D.   On: 07/23/2024 13:03   DG ABD ACUTE 2+V W 1V CHEST Result Date: 07/22/2024 CLINICAL  DATA:  [id number] Dyspnea [id number] [phone] Encounter for imaging study to confirm nasogastric (NG) tube placement [id number] EXAM: DG ABDOMEN ACUTE WITH 1 VIEW CHEST COMPARISON:  Chest x-ray 07/19/2024 FINDINGS: Enteric tube courses below the diaphragm with tip and side port overlying the expected region of the gastric lumen. The heart and mediastinal contours are within normal limits. No focal consolidation. Chronic coarsened interstitial markings with no overt pulmonary edema. Bilateral trace pleural effusion. No pneumothorax. Right upper quadrant surgical clips. There is no evidence of dilated bowel loops or free intraperitoneal air. No radiopaque calculi or other significant radiographic abnormality is seen. No acute osseous abnormality.  Intact sternotomy wires. IMPRESSION: 1. Enteric tube in good position. 2. Bowel trace pleural effusions. 3. Nonobstructive bowel gas pattern. Electronically Signed   By: Morgane  Naveau M.D.   On: 07/22/2024 14:18   DG Chest Port 1 View Result Date: 07/19/2024 CLINICAL DATA:  Dyspnea EXAM: PORTABLE CHEST 1 VIEW COMPARISON:  07/18/2024 FINDINGS: Two frontal views of the chest demonstrate an enteric catheter passing below diaphragm, tip excluded by collimation. Left internal jugular catheter tip overlies superior vena cava unchanged. 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 The cardiac silhouette is stable. Mild central vascular congestion, with a trace right pleural effusion unchanged since prior exam. The nodular density projecting over the right upper lateral chest on prior study is less well seen on this exam. This may be due to positioning. No pneumothorax. No acute bony abnormalities. IMPRESSION: 1. Pulmonary vascular congestion and small right pleural effusion, unchanged since prior study. 2. The nodular consolidation within the right upper lateral chest seen on prior x-ray and CT is not well seen on this exam, likely due to patient positioning. Electronically Signed   By: Ozell Daring M.D.   On: 07/19/2024 17:34   DG Chest Port 1 View Result Date: 07/18/2024 EXAM: 1 VIEW XRAY OF THE CHEST 07/18/2024 04:59:06 AM COMPARISON: 07/16/2024 CLINICAL HISTORY: [id number] Acute respiratory failure with hypoxia (HCC) 427266. Acute respiratory failure with hypoxia FINDINGS: LUNGS AND PLEURA: Nodular opacity within  the periphery of the right upper lobe appears similar to CT from 07/11/2024. Please refer to that report for follow-up recommendations. Mild asymmetric hazy opacification over the right mid and right lower lung may be related to positional artifact versus small posterior layering effusion. Mild subsegmental atelectasis in the left base. HEART AND MEDIASTINUM: Stable cardiomediastinal contours. Previous median sternotomy and CABG procedure. BONES AND SOFT TISSUES: Aortic atherosclerotic calcification. Left IJ catheter is identified with the tip in the projection of the SVC. There is an enteric tube which courses below the field of view. ET tube tip is stable above the carina. IMPRESSION: 1. Stable nodular opacity within the periphery of the right upper lobe, similar to CT from 07/11/2024. Please refer to that report for follow-up recommendations. 2. Mild asymmetric hazy opacification over the right mid and right lower lung, possibly related to rotational artifact versus small posterior layering effusion. 3. Mild subsegmental atelectasis in the left base. Electronically signed by: Waddell Calk MD 07/18/2024 06:51 AM EDT RP Workstation: [id number]   DG Chest Port 1 View Result Date: 07/16/2024 CLINICAL DATA:  Endotracheal tube and central line placement. EXAM: PORTABLE CHEST 1 VIEW COMPARISON:  07/16/2024 at 8:58 a.m. FINDINGS: Patient is rotated to the right. Endotracheal tube has tip proximally 13.5 cm above the carina. This could be advanced approximately 6 cm. Left IJ central venous catheter has tip over the SVC. Enteric tube courses into the region of the stomach and off the image as tip  is not visualized. Lungs are adequately inflated with possible mild hazy opacification over the right upper lung abutting the minor fissure which may be due to atelectasis or infection. Left lung is clear. Cardiomediastinal silhouette and remainder of the exam is unchanged. IMPRESSION: 1. Endotracheal tube has tip proximally 13.5 cm above the carina. This could be advanced approximately 6 cm. 2. Left IJ central venous catheter with tip over the SVC. 3. Possible mild hazy opacification over the right upper lung abutting the minor fissure which may be due to atelectasis or infection. Electronically Signed   By: Toribio Agreste M.D.   On: 07/16/2024 10:53   DG Chest Port 1 View Result Date: 07/16/2024 CLINICAL DATA:  [id number] Aspiration into airway [id number]. EXAM: PORTABLE CHEST 1 VIEW COMPARISON:  06/23/2024. FINDINGS: Bilateral lungs appear hyperlucent with coarse bronchovascular markings, in keeping with COPD. Bilateral lungs otherwise appear clear. No dense consolidation or lung collapse. Bilateral costophrenic angles are clear. Stable cardio-mediastinal silhouette. There are surgical staples along the heart border and sternotomy wires, status post CABG (coronary artery bypass graft). No acute osseous abnormalities. The soft tissues are within normal limits. Enteric tube is seen coursing below the left hemidiaphragm however, the tip is not included on the film. IMPRESSION: No active disease. COPD. Electronically Signed   By: Ree Molt M.D.   On: 07/16/2024 09:09   DG Abd 1 View Result Date: 07/16/2024 CLINICAL DATA:  Vomiting.  NG tube placement. EXAM: ABDOMEN - 1 VIEW COMPARISON:  07/15/2024 FINDINGS: Nasogastric tube has tip and side-port over the stomach in the upper abdomen just left of midline. Visualized lower thorax unchanged. Abdominopelvic images demonstrate a non obstructive bowel gas pattern. Mild fecal retention throughout the colon. Surgical clips over the right upper quadrant. There are  degenerative changes of the spine and hips. Calcified plaque over the abdominal aorta and iliac arteries. IMPRESSION: 1. Nasogastric tube with tip and side-port over the stomach. 2. Nonobstructive bowel gas pattern. Mild fecal retention. 3. Aortic atherosclerosis. Electronically Signed  By: Toribio Agreste M.D.   On: 07/16/2024 07:51   DG Abd 1 View Result Date: 07/15/2024 CLINICAL DATA:  Orogastric tube placement. EXAM: ABDOMEN - 1 VIEW COMPARISON:  June 29, 2023. FINDINGS: Distal tip of nasogastric tube is seen in expected position of proximal stomach. IMPRESSION: Nasogastric tube tip seen in expected position of proximal stomach. Electronically Signed   By: Lynwood Landy Raddle M.D.   On: 07/15/2024 10:25   DG Chest Port 1 View Result Date: 07/13/2024 CLINICAL DATA:  Respiratory failure with hypoxia. EXAM: PORTABLE CHEST 1 VIEW COMPARISON:  Multiple previous chest x-rays and recent chest CT. FINDINGS: Stable surgical changes from bypass surgery. The cardiac silhouette, mediastinal and hilar contours are within normal limits and stable. Underlying emphysematous changes and pulmonary scarring. No pleural effusions or discrete pulmonary infiltrates. No pneumothorax. IMPRESSION: Emphysematous changes and pulmonary scarring but no acute overlying pulmonary process. Electronically Signed   By: MYRTIS Stammer M.D.   On: 07/13/2024 16:08   US  Abdomen Limited RUQ (LIVER/GB) Result Date: 07/13/2024 CLINICAL DATA:  Portal hypertension. EXAM: ULTRASOUND ABDOMEN LIMITED RIGHT UPPER QUADRANT COMPARISON:  Abdominal ultrasound 10/19/2022 FINDINGS: Gallbladder: Surgically absent. Common bile duct: Diameter: 3.3 mm Liver: No focal lesion identified. Parenchymal echogenicity is increased and diffusely heterogeneous. There is lobular liver contour. Portal vein is patent on color Doppler imaging with normal direction of blood flow towards the liver. Other: None. IMPRESSION: Lobular liver contour with increased echogenicity and  heterogeneous parenchyma consistent with cirrhosis. No focal liver lesion identified. Electronically Signed   By: Greig Pique M.D.   On: 07/13/2024 16:03   DG Chest Port 1 View Result Date: 07/12/2024 CLINICAL DATA:  Shortness of breath. EXAM: PORTABLE CHEST 1 VIEW COMPARISON:  07/11/2024 FINDINGS: The cardio pericardial silhouette is enlarged. There is pulmonary vascular congestion without overt pulmonary edema. Trace bilateral pleural effusions. Old posterior left sixth rib fracture. Telemetry leads overlie the chest. IMPRESSION: Enlargement of the cardiopericardial silhouette with new pulmonary vascular congestion and trace bilateral pleural effusions. Electronically Signed   By: Camellia Candle M.D.   On: 07/12/2024 12:11   CT Angio Chest PE W/Cm &/Or Wo Cm Result Date: 07/11/2024 CLINICAL DATA:  High probability for PE. Chest pain with shortness of breath EXAM: CT ANGIOGRAPHY CHEST WITH CONTRAST TECHNIQUE: Multidetector CT imaging of the chest was performed using the standard protocol during bolus administration of intravenous contrast. Multiplanar CT image reconstructions and MIPs were obtained to evaluate the vascular anatomy. RADIATION DOSE REDUCTION: This exam was performed according to the departmental dose-optimization program which includes automated exposure control, adjustment of the mA and/or kV according to patient size and/or use of iterative reconstruction technique. CONTRAST:  75mL OMNIPAQUE  IOHEXOL  350 MG/ML SOLN COMPARISON:  CT angiogram chest 05/09/2024 FINDINGS: Cardiovascular: Satisfactory opacification of the pulmonary arteries to the segmental level. No evidence of pulmonary embolism. Patient is status post cardiac surgery. The heart is enlarged. There is no pericardial effusion. There are atherosclerotic calcifications of the aorta and coronary arteries. Aorta is normal in size. Mediastinum/Nodes: There are enlarged right hilar lymph nodes measuring up to 11 mm. Enlarged paratracheal  lymph nodes measure up to 10 mm. Visualized thyroid gland and esophagus are within normal limits. Lungs/Pleura: Moderate emphysema again seen. There is stable scarring in the right lung apex. There is new patchy slightly nodular airspace disease in the right upper lobe measuring 2.0 by 1.1 cm. There is right-sided central peribronchial wall thickening. Previously identified airspace disease in the left lower lobe has resolved.  There is no pleural effusion or pneumothorax. There is a stable left lower lobe pulmonary nodule image 6/57. Upper Abdomen: No acute abnormality. Musculoskeletal: No chest wall abnormality. No acute or significant osseous findings. Review of the MIP images confirms the above findings. IMPRESSION: 1. No evidence for pulmonary embolism. 2. New patchy slightly nodular airspace disease in the right upper lobe worrisome for pneumonia. Follow-up CT recommended in 4-6 weeks recommended to ensure resolution and to exclude underlying nodule. 3. Right-sided central peribronchial wall thickening worrisome for bronchitis. 4. Right hilar and mediastinal lymphadenopathy, likely reactive. 5. Stable left lower lobe pulmonary nodule. 6. Cardiomegaly. Aortic Atherosclerosis (ICD10-I70.0) and Emphysema (ICD10-J43.9). Electronically Signed   By: Greig Pique M.D.   On: 07/11/2024 18:54   DG Chest Portable 1 View Result Date: 07/11/2024 CLINICAL DATA:  SOB EXAM: PORTABLE CHEST - 1 VIEW COMPARISON:  June 24, 2024 FINDINGS: Biapical pleural thickening. Sternotomy wires and CABG markers. No focal airspace consolidation, pleural effusion, or pneumothorax. Mild cardiomegaly. Tortuous aorta with aortic atherosclerosis. No acute fracture or destructive lesions. Multilevel thoracic osteophytosis. IMPRESSION: No acute cardiopulmonary abnormality. Electronically Signed   By: Rogelia Myers M.D.   On: 07/11/2024 17:25   CARDIAC CATHETERIZATION Result Date: 07/02/2024   Prox LAD to Mid LAD lesion is 90% stenosed.   Ost  LM to Mid LM lesion is 50% stenosed.   Ost Cx to Prox Cx lesion is 90% stenosed.   Ost RCA to Prox RCA lesion is 100% stenosed.   Mid Graft to Dist Graft lesion is 95% stenosed.   Recommend uninterrupted dual antiplatelet therapy with Aspirin  81mg  daily and Clopidogrel  75mg  daily for a minimum of 6 months (stable ischemic heart disease-Class I recommendation). 1.  Severe native three-vessel CAD 2.  LIMA to LAD and SVG to OM widely patent 3.  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  Successful direct stenting of the distal SVG to PDA with distal protection with a 4.0 x 15 mm drug-eluting stent with intravascular ultrasound guidance 5.  Aspirin  and clopidogrel  for at least 6 months followed by clopidogrel  indefinitely 6.  Continue workup for aortic valve replacement   DG Chest 2 View Result Date: 06/24/2024 CLINICAL DATA:  sob EXAM: CHEST - 2 VIEW COMPARISON:  June 03, 2024 FINDINGS: Biapical pleural thickening. No focal airspace consolidation, pleural effusion, or pneumothorax. No cardiomegaly. Sternotomy wires and CABG markers. Tortuous aorta with aortic atherosclerosis. No acute fracture or destructive lesions. Multilevel thoracic osteophytosis. Osteopenia. IMPRESSION: No acute cardiopulmonary abnormality. Electronically Signed   By: Rogelia Myers M.D.   On: 06/24/2024 11:54    ECHO 04/2024: 1. Left ventricular ejection fraction, by estimation, is 60 to 65%. The left ventricle has normal function. The left ventricle has no regional wall motion abnormalities. The left ventricular internal cavity size was mildly dilated. Left ventricular diastolic parameters are consistent with Grade I diastolic dysfunction (impaired relaxation).   2. Right ventricular systolic function is normal. The right ventricular  size is normal.   3. The mitral valve is normal in structure. No evidence of mitral valve  regurgitation.   4. The aortic valve is normal in structure. Aortic valve regurgitation is   trivial. Severe aortic valve stenosis.   TELEMETRY reviewed by me 07/24/2024: sinus rhythm, rate 90s  EKG reviewed by me: sinus tachycardia ST depression, rate 129 bpm  Data reviewed by me 07/24/2024: last 24h vitals tele labs imaging I/O hospitalist progress note, PCCM notes  Principal Problem:   Acute  respiratory distress Active Problems:   Hyperlipidemia   Cognitive impairment   Alcohol  abuse   Essential hypertension   CAD (coronary artery disease)   Stroke (HCC)   Iron  deficiency anemia   Acute on chronic diastolic CHF (congestive heart failure) (HCC)   COPD exacerbation (HCC)   Elevated lactic acid level   Lobar pneumonia (HCC)   Metabolic acidosis, increased anion gap   Myocardial injury    ASSESSMENT AND PLAN:  [name] is a 63 y.o. 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 Found to have pneumonia, overnight troponins uptrending. Cardiology was consulted for further evaluation.   # Ventricular Ectopy Per tele patient with bigeminy PVCs, started on IV amio on 07/30 for ventricular ectopy. PVC much improved s/p IV amio. Per tele this AM SR with rate 90s with no more ventricular ectopy -Continue to monitor telemetry closely.  -Monitor and replenish electrolytes for a goal K >4, Mag >2  -Continue Amio 400 mg BID for 10 days, then 200 mg daily.   # Acute hypoxic respiratory failure, mechanical ventilation # Aspiration pneumonia # Alcohol  withdrawal, improving # Acute on chronic HFpEF # Severe aortic stenosis Presented with SOB, cough, imaging concerning for HCAP/COPD exacerbation. LA elevated on presentation at 3.6 > 5.9> 3.4. s/p  extubation 08/01. Increased O2 need to 10L, concern for aspiration event on 08/05. NGT in place. This AM (08/07) Bun:Cr ratio 48:1, concerning for pre-renal azotemia, possibly related to fluid overload with bibasilar crackles on exam, trace LEE.  -Ordered IV lasix  40 mg BID.  Closely monitor renal function, UOP. -Will resume home dapagliflozin  10 mg daily when able to take PO medication. -Will plan to optimize GDMT as BP allows and when able to take PO.  (Home meds: losartan , metoprolol  succinate, spironolactone ) -Further management of respiratory failure/ aspiration PNA as per primary team.  -CIWA protocol given hx of alcohol  abuse. -Scheduled to see Dr. Katina 7/30 to discuss aortic valve repair/replacement options. Will reschedule this appointment when patient stabilizes.    # Demand ischemia # Coronary artery disease s/p CABG x3 (04/2012) s/p recent stent (07/02/24) # Hypertension # Hyperlipidemia With recent LHC, s/p DES to SVG to PDA 07/02/2024. Reported some CP when he first came to hospital which has now resolved, no recurrence. Troponin trended 44 > 144 > 205 > 980 > 1862 > 5766 > 5756. Patient weaned off levo on 07/31, BP improving and stabilizing. Reports chest tightness with deep breathing, MSK tenderness on palpation, trops repeated (08/07), minimally elevated and flat 72 > 75 is demand ischemia, not ACS is setting of stated above hospital concerns. Repeat EKG (08/07) is without acute ischemic changes.   -Continue home plavix  75 mg daily. Continue aspirin  81 mg daily. Continue DAPT due to recent stent placement on 07/16. -Continue Crestor  20 mg daily for high-intensity statin management.  -Will resume home metoprolol  succinate when able to tolerate PO. -Home losartan , Imdur  held due to BP borderline. Will consider resuming as BP continues to improve and able to tolerate PO. -Recommend MAP > 65.   # Acute on chronic anemia # Hx AVM Patient received blood transfusion on 07/28, 07/31,  08/04.  Hgb  8.0 > 8.6 >9.3 -Closely monitor H&H. -Continue PPIs. -Goal Hgb >8 due to cardiac history. Recommend transfusion when <8.  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 Continue DAPT due to recent stent placement.  -Management per primary team.    This patient's plan of care was discussed and created with Dr. Florencio and he is in agreement.  Signed: Dorene Comfort, PA-C  07/24/2024, 9:41 AM [facility]

## 2024-07-24 NOTE — Plan of Care (Signed)
  Problem: Nutrition: Goal: Adequate nutrition will be maintained Outcome: Progressing   Problem: Safety: Goal: Ability to remain free from injury will improve Outcome: Progressing   Problem: Clinical Measurements: Goal: Respiratory complications will improve Outcome: Not Progressing   Problem: Activity: Goal: Risk for activity intolerance will decrease Outcome: Not Progressing   Problem: Coping: Goal: Level of anxiety will decrease Outcome: Not Progressing   Problem: Elimination: Goal: Will not experience complications related to urinary retention Outcome: Not Progressing   Problem: Pain Managment: Goal: General experience of comfort will improve and/or be controlled Outcome: Not Progressing   Problem: Activity: Goal: Ability to tolerate increased activity will improve Outcome: Not Progressing   Problem: Respiratory: Goal: Ability to maintain a clear airway will improve Outcome: Not Progressing Goal: Levels of oxygenation will improve Outcome: Not Progressing

## 2024-07-24 NOTE — Progress Notes (Addendum)
 PT Cancellation Note  Patient Details Name: [name] MRN: [id number] DOB: [birth year]   Cancelled Treatment:     Progressive SOB requiring high flow [place] 70% this am. Hgb continues to trend down. Will hold PT session and re-attempt next available date/time per POC and pt tolerance.    [name] 07/24/2024, 12:11 PM

## 2024-07-24 NOTE — Progress Notes (Addendum)
 Critical care note:  Date of note: 07/24/2024.  Subjective: The patient is been having worsening respiratory distress with associated chest pain described as tightness graded  7/10 with no radiation . No fever or chills.  No nausea or vomiting.  He has been getting enteral nutrition.  He was placed on 100% nonrebreather.   Objective: Physical examination: Generally: Acutely ill elderly Caucasian male in mild respiratory distress with conversational dyspnea. Vital signs per history of present illness. Head - atraumatic, normocephalic.  Pupils - equal, round and reactive to light and accommodation. Extraocular movements are intact. No scleral icterus.  Oropharynx - moist mucous membranes and tongue. No pharyngeal erythema or exudate.  Neck - supple. No JVD. Carotid pulses 2+ bilaterally. No carotid bruits. No palpable thyromegaly or lymphadenopathy. Cardiovascular - regular rate and rhythm. Normal S1 and S2. No murmurs, gallops or rubs.  Lungs -diminished bibasilar breath sounds with bibasal crackles. Abdomen - soft and nontender. Positive bowel sounds. No palpable organomegaly or masses.  Extremities -trace bilateral lower extremity pitting edema, with no clubbing or cyanosis. Musculoskeletal: Reproducible left parasternal chest pain with tenderness. Neuro - grossly non-focal. Skin - no rashes. GU and rectal exam - deferred.  Labs and notes were reviewed.  CBC showed CO2 of 33 and blood glucose 160, BUN of 48 and creatinine 1.05 with calcium  8.7 phosphorus 4.9 with albumin 2.9.  High sensitive troponin I was 72.  CBC showed leukocytosis 15.7.  Assessment/plan: 1.  Acute on chronic respiratory failure with hypoxia likely secondary to aspiration and possibly mild interstitial edema. - The patient was upgraded to stepdown unit. - He received 2 deep nasotracheal suction with improvement. - DuoNeb was given. - IV Lasix  was given with subsequent diuresis with improvement of his symptoms. -  Enteral feedings were held off. - Will continue to closely monitor him.  2.  Elevated troponin I with associated chest pain. - Will need to rule out ACS. - The patient will be placed on as needed IV morphine  sulfate as well as nitroglycerin . - Will defer further workup hospitalist.  Authorized and performed by: Madison Peaches, MD Total critical care time:   30     minutes. Due to a high probability of clinically significant, life-threatening deterioration, the patient required my highest level of preparedness to intervene emergently and I personally spent this critical care time directly and personally managing the patient.  This critical care time included obtaining a history, examining the patient, pulse oximetry, ordering and review of studies, arranging urgent treatment with development of management plan, evaluation of patient's response to treatment, frequent reassessment, and discussions with other providers. This critical care time was performed to assess and manage the high probability of imminent, life-threatening deterioration that could result in multiorgan failure.  It was exclusive of separately billable procedures and treating other patients and teaching time.

## 2024-07-24 NOTE — Progress Notes (Signed)
 Deep NT suction X2 with moderate output.

## 2024-07-24 NOTE — Plan of Care (Signed)
  Problem: Education: Goal: Knowledge of General Education information will improve Description: Including pain rating scale, medication(s)/side effects and non-pharmacologic comfort measures Outcome: Progressing   Problem: Health Behavior/Discharge Planning: Goal: Ability to manage health-related needs will improve Outcome: Progressing   Problem: Clinical Measurements: Goal: Ability to maintain clinical measurements within normal limits will improve Outcome: Progressing Goal: Will remain free from infection Outcome: Progressing Goal: Diagnostic test results will improve Outcome: Progressing Goal: Respiratory complications will improve Outcome: Progressing Goal: Cardiovascular complication will be avoided Outcome: Progressing   Problem: Activity: Goal: Risk for activity intolerance will decrease Outcome: Progressing   Problem: Nutrition: Goal: Adequate nutrition will be maintained Outcome: Progressing   Problem: Coping: Goal: Level of anxiety will decrease Outcome: Progressing   Problem: Elimination: Goal: Will not experience complications related to bowel motility Outcome: Progressing Goal: Will not experience complications related to urinary retention Outcome: Progressing   Problem: Pain Managment: Goal: General experience of comfort will improve and/or be controlled Outcome: Progressing   Problem: Safety: Goal: Ability to remain free from injury will improve Outcome: Progressing   Problem: Skin Integrity: Goal: Risk for impaired skin integrity will decrease Outcome: Progressing   Problem: Education: Goal: Knowledge of disease or condition will improve Outcome: Progressing Goal: Knowledge of the prescribed therapeutic regimen will improve Outcome: Progressing Goal: Individualized Educational Video(s) Outcome: Progressing   Problem: Activity: Goal: Ability to tolerate increased activity will improve Outcome: Progressing Goal: Will verbalize the  importance of balancing activity with adequate rest periods Outcome: Progressing   Problem: Respiratory: Goal: Ability to maintain a clear airway will improve Outcome: Progressing Goal: Levels of oxygenation will improve Outcome: Progressing Goal: Ability to maintain adequate ventilation will improve Outcome: Progressing   Problem: Activity: Goal: Ability to tolerate increased activity will improve Outcome: Progressing   Problem: Clinical Measurements: Goal: Ability to maintain a body temperature in the normal range will improve Outcome: Progressing   Problem: Respiratory: Goal: Ability to maintain adequate ventilation will improve Outcome: Progressing Goal: Ability to maintain a clear airway will improve Outcome: Progressing   Problem: Activity: Goal: Ability to tolerate increased activity will improve Outcome: Progressing   Problem: Respiratory: Goal: Ability to maintain a clear airway and adequate ventilation will improve Outcome: Progressing   Problem: Role Relationship: Goal: Method of communication will improve Outcome: Progressing

## 2024-07-24 NOTE — Progress Notes (Signed)
 SLP Cancellation Note  Patient Details Name: [name] MRN: [id number] DOB: [birth year]   Cancelled treatment:       Reason Eval/Treat Not Completed: Medical issues which prohibited therapy. SLP continue to follow for readiness for PO vs completion of MBSS. Pt with increased oxygen requirements this AM, resting in bed, with increased effortful breathing pattern. RN in agreement to hold SLP services this date. MD aware of plan. SLP will continue to follow.   [place] Yajahira Tison Clapp, MS, CCC-SLP Speech Language Pathologist Rehab Services; [facility] [phone] (ascom)     [place] J Clapp 07/24/2024, 9:40 AM

## 2024-07-24 NOTE — Progress Notes (Signed)
   07/24/24 1252  Spiritual Encounters  Type of Visit Initial  Care provided to: Patient  Referral source Nurse (RN/NT/LPN)  Reason for visit Routine spiritual support  OnCall Visit Yes  Spiritual Framework  Presenting Themes Meaning/purpose/sources of inspiration;Goals in life/care;Values and beliefs;Significant life change;Impactful experiences and emotions (Pt is afraid he will die and not be able to say good-bye to his kids; he also hates the wheezing it sounds like crying to him.)  Values/beliefs Pt stated he's afraid to die and we talked about his beliefs and new experiences  Patient Stress Factors Health changes;Family relationships  Interventions  Spiritual Care Interventions Made Established relationship of care and support;Compassionate presence;Reflective listening;Explored values/beliefs/practices/strengths  Intervention Outcomes  Outcomes Connection to spiritual care;Awareness of health;Awareness of support;Reduced anxiety (He said he was in pain and I let his nurse know and he said afterward that he was feeling better)

## 2024-07-25 DIAGNOSIS — Z515 Encounter for palliative care: Secondary | ICD-10-CM | POA: Diagnosis not present

## 2024-07-25 DIAGNOSIS — J189 Pneumonia, unspecified organism: Secondary | ICD-10-CM | POA: Diagnosis not present

## 2024-07-25 DIAGNOSIS — J441 Chronic obstructive pulmonary disease with (acute) exacerbation: Secondary | ICD-10-CM | POA: Diagnosis not present

## 2024-07-25 DIAGNOSIS — R0603 Acute respiratory distress: Secondary | ICD-10-CM | POA: Diagnosis not present

## 2024-07-25 DIAGNOSIS — J690 Pneumonitis due to inhalation of food and vomit: Secondary | ICD-10-CM | POA: Diagnosis not present

## 2024-07-25 DIAGNOSIS — J181 Lobar pneumonia, unspecified organism: Secondary | ICD-10-CM | POA: Diagnosis not present

## 2024-07-25 DIAGNOSIS — J9602 Acute respiratory failure with hypercapnia: Secondary | ICD-10-CM | POA: Diagnosis not present

## 2024-07-25 DIAGNOSIS — R7989 Other specified abnormal findings of blood chemistry: Secondary | ICD-10-CM | POA: Diagnosis not present

## 2024-07-25 DIAGNOSIS — J9601 Acute respiratory failure with hypoxia: Secondary | ICD-10-CM | POA: Diagnosis not present

## 2024-07-25 DIAGNOSIS — I469 Cardiac arrest, cause unspecified: Secondary | ICD-10-CM | POA: Diagnosis not present

## 2024-07-25 LAB — CBC
HCT: 26.2 % — ABNORMAL LOW (ref 39.0–52.0)
Hemoglobin: 7.7 g/dL — ABNORMAL LOW (ref 13.0–17.0)
MCH: 28.1 pg (ref 26.0–34.0)
MCHC: 29.4 g/dL — ABNORMAL LOW (ref 30.0–36.0)
MCV: 95.6 fL (ref 80.0–100.0)
Platelets: 231 K/uL (ref 150–400)
RBC: 2.74 MIL/uL — ABNORMAL LOW (ref 4.22–5.81)
RDW: 17.4 % — ABNORMAL HIGH (ref 11.5–15.5)
WBC: 10.5 K/uL (ref 4.0–10.5)
nRBC: 0 % (ref 0.0–0.2)

## 2024-07-25 LAB — BASIC METABOLIC PANEL WITH GFR
Anion gap: 11 (ref 5–15)
Anion gap: 14 (ref 5–15)
BUN: 54 mg/dL — ABNORMAL HIGH (ref 8–23)
BUN: 55 mg/dL — ABNORMAL HIGH (ref 8–23)
CO2: 34 mmol/L — ABNORMAL HIGH (ref 22–32)
CO2: 31 mmol/L (ref 22–32)
Calcium: 8.5 mg/dL — ABNORMAL LOW (ref 8.9–10.3)
Calcium: 8.5 mg/dL — ABNORMAL LOW (ref 8.9–10.3)
Chloride: 99 mmol/L (ref 98–111)
Chloride: 95 mmol/L — ABNORMAL LOW (ref 98–111)
Creatinine, Ser: 1.15 mg/dL (ref 0.61–1.24)
Creatinine, Ser: 1.2 mg/dL (ref 0.61–1.24)
GFR, Estimated: >60 mL/min (ref >60)
GFR, Estimated: >60 mL/min (ref >60)
Glucose, Bld: 125 mg/dL — ABNORMAL HIGH (ref 70–99)
Glucose, Bld: 117 mg/dL — ABNORMAL HIGH (ref 70–99)
Potassium: 4.8 mmol/L (ref 3.5–5.1)
Potassium: 4.2 mmol/L (ref 3.5–5.1)
Sodium: 144 mmol/L (ref 135–145)
Sodium: 140 mmol/L (ref 135–145)

## 2024-07-25 LAB — PREPARE RBC (CROSSMATCH)

## 2024-07-25 LAB — CULTURE, RESPIRATORY W GRAM STAIN: Culture: NORMAL

## 2024-07-25 LAB — PHOSPHORUS: Phosphorus: 5 mg/dL — ABNORMAL HIGH (ref 2.5–4.6)

## 2024-07-25 LAB — MAGNESIUM: Magnesium: 2.9 mg/dL — ABNORMAL HIGH (ref 1.7–2.4)

## 2024-07-25 LAB — GLUCOSE, CAPILLARY
Glucose-Capillary: 121 mg/dL — ABNORMAL HIGH (ref 70–99)
Glucose-Capillary: 102 mg/dL — ABNORMAL HIGH (ref 70–99)
Glucose-Capillary: 127 mg/dL — ABNORMAL HIGH (ref 70–99)
Glucose-Capillary: 148 mg/dL — ABNORMAL HIGH (ref 70–99)

## 2024-07-25 MED ORDER — SODIUM CHLORIDE 0.9% IV SOLUTION: Freq: Once | INTRAVENOUS | Status: AC

## 2024-07-25 MED ORDER — ROSUVASTATIN CALCIUM 10 MG PO TABS
20.0000 mg | ORAL_TABLET | Freq: Every day | ORAL | Status: DC
  Administered 2024-07-25 – 2024-07-31 (×10): 20 mg via ORAL
  Filled 2024-07-25 (×7): qty 2

## 2024-07-25 MED ORDER — POLYSACCHARIDE IRON COMPLEX 150 MG PO CAPS
150.0000 mg | ORAL_CAPSULE | Freq: Every day | ORAL | Status: DC
  Administered 2024-07-25 – 2024-07-31 (×10): 150 mg via ORAL
  Filled 2024-07-25 (×7): qty 1

## 2024-07-25 MED ORDER — POLYETHYLENE GLYCOL 3350 17 G PO PACK
17.0000 g | PACK | Freq: Every day | ORAL | Status: DC
  Administered 2024-07-25 – 2024-07-31 (×10): 17 g via ORAL
  Filled 2024-07-25 (×7): qty 1

## 2024-07-25 MED ORDER — ACETAZOLAMIDE SODIUM 500 MG IJ SOLR
500.0000 mg | Freq: Two times a day (BID) | INTRAMUSCULAR | Status: DC
  Administered 2024-07-25: 500 mg via INTRAVENOUS
  Filled 2024-07-25: qty 500

## 2024-07-25 MED ORDER — METOPROLOL SUCCINATE ER 25 MG PO TB24
12.5000 mg | ORAL_TABLET | Freq: Every day | ORAL | Status: DC
  Administered 2024-07-25 – 2024-07-29 (×7): 12.5 mg via ORAL
  Filled 2024-07-25: qty 1
  Filled 2024-07-25: qty 0.5
  Filled 2024-07-25 (×2): qty 1
  Filled 2024-07-25: qty 0.5

## 2024-07-25 MED ORDER — VITAMIN D 25 MCG (1000 UNIT) PO TABS
2000.0000 [IU] | ORAL_TABLET | Freq: Every day | ORAL | Status: DC
  Administered 2024-07-25: 2000 [IU] via ORAL
  Filled 2024-07-25: qty 2

## 2024-07-25 MED ORDER — FOLIC ACID 1 MG PO TABS
1.0000 mg | ORAL_TABLET | Freq: Every day | ORAL | Status: DC
  Administered 2024-07-25 – 2024-07-31 (×10): 1 mg via ORAL
  Filled 2024-07-25 (×7): qty 1

## 2024-07-25 MED ORDER — VITAMIN D (ERGOCALCIFEROL) 1.25 MG (50000 UNIT) PO CAPS
50000.0000 [IU] | ORAL_CAPSULE | ORAL | Status: DC
  Administered 2024-07-26: 50000 [IU] via ORAL
  Filled 2024-07-25: qty 1

## 2024-07-25 MED ORDER — FUROSEMIDE 10 MG/ML IJ SOLN
40.0000 mg | Freq: Two times a day (BID) | INTRAMUSCULAR | Status: DC
  Administered 2024-07-25 – 2024-07-27 (×5): 40 mg via INTRAVENOUS
  Filled 2024-07-25 (×5): qty 4

## 2024-07-25 MED ORDER — LACTULOSE 10 GM/15ML PO SOLN: 20.0000 g | Freq: Every day | ORAL | Status: DC | PRN

## 2024-07-25 MED ORDER — VITAMIN C 500 MG PO TABS
500.0000 mg | ORAL_TABLET | Freq: Every day | ORAL | Status: DC
  Administered 2024-07-25 – 2024-07-31 (×10): 500 mg via ORAL
  Filled 2024-07-25 (×7): qty 1

## 2024-07-25 MED ORDER — ACETAZOLAMIDE ER 500 MG PO CP12
500.0000 mg | ORAL_CAPSULE | Freq: Two times a day (BID) | ORAL | Status: DC
  Administered 2024-07-26 – 2024-07-27 (×4): 500 mg via ORAL
  Filled 2024-07-25 (×6): qty 1

## 2024-07-25 MED ORDER — IPRATROPIUM-ALBUTEROL 0.5-2.5 (3) MG/3ML IN SOLN
3.0000 mL | Freq: Four times a day (QID) | RESPIRATORY_TRACT | Status: DC
  Administered 2024-07-25 – 2024-07-26 (×5): 3 mL via RESPIRATORY_TRACT
  Filled 2024-07-25 (×5): qty 3

## 2024-07-25 MED ORDER — ACETAMINOPHEN 325 MG PO TABS
650.0000 mg | ORAL_TABLET | Freq: Four times a day (QID) | ORAL | Status: DC | PRN
  Administered 2024-07-28 – 2024-07-31 (×3): 650 mg via ORAL
  Filled 2024-07-25 (×2): qty 2

## 2024-07-25 MED ORDER — ADULT MULTIVITAMIN W/MINERALS CH
1.0000 | ORAL_TABLET | Freq: Every day | ORAL | Status: DC
  Administered 2024-07-25 – 2024-07-31 (×10): 1 via ORAL
  Filled 2024-07-25 (×7): qty 1

## 2024-07-25 MED ORDER — ASPIRIN 81 MG PO CHEW
81.0000 mg | CHEWABLE_TABLET | Freq: Every day | ORAL | Status: DC
  Administered 2024-07-25 – 2024-07-31 (×10): 81 mg via ORAL
  Filled 2024-07-25 (×7): qty 1

## 2024-07-25 MED ORDER — AMIODARONE HCL 200 MG PO TABS
200.0000 mg | ORAL_TABLET | Freq: Every day | ORAL | Status: DC
  Administered 2024-07-28 – 2024-07-31 (×7): 200 mg via ORAL
  Filled 2024-07-25 (×4): qty 1

## 2024-07-25 MED ORDER — AMIODARONE HCL 200 MG PO TABS
400.0000 mg | ORAL_TABLET | Freq: Two times a day (BID) | ORAL | Status: AC
  Administered 2024-07-25 – 2024-07-27 (×6): 400 mg via ORAL
  Filled 2024-07-25 (×6): qty 2

## 2024-07-25 MED ORDER — ACETAZOLAMIDE SODIUM 500 MG IJ SOLR
500.0000 mg | Freq: Two times a day (BID) | INTRAMUSCULAR | Status: AC
  Administered 2024-07-25: 500 mg via INTRAVENOUS
  Filled 2024-07-25: qty 500

## 2024-07-25 MED ORDER — THIAMINE MONONITRATE 100 MG PO TABS
100.0000 mg | ORAL_TABLET | Freq: Every day | ORAL | Status: DC
  Administered 2024-07-25 – 2024-07-31 (×10): 100 mg via ORAL
  Filled 2024-07-25 (×7): qty 1

## 2024-07-25 MED ORDER — ENSURE PLUS HIGH PROTEIN PO LIQD
237.0000 mL | Freq: Three times a day (TID) | ORAL | Status: DC
  Administered 2024-07-25 – 2024-07-31 (×24): 237 mL via ORAL

## 2024-07-25 MED ORDER — CLOPIDOGREL BISULFATE 75 MG PO TABS
75.0000 mg | ORAL_TABLET | Freq: Every day | ORAL | Status: DC
  Administered 2024-07-25 – 2024-07-31 (×10): 75 mg via ORAL
  Filled 2024-07-25 (×7): qty 1

## 2024-07-25 MED ORDER — FUROSEMIDE 10 MG/ML IJ SOLN: 40.0000 mg | Freq: Every day | INTRAMUSCULAR | Status: DC

## 2024-07-25 MED ORDER — ACETAZOLAMIDE SODIUM 500 MG IJ SOLR
500.0000 mg | Freq: Two times a day (BID) | INTRAMUSCULAR | Status: DC
  Filled 2024-07-25: qty 500

## 2024-07-25 MED ORDER — FUROSEMIDE 10 MG/ML IJ SOLN: 40.0000 mg | Freq: Two times a day (BID) | INTRAMUSCULAR | Status: DC

## 2024-07-25 NOTE — Progress Notes (Signed)
 Nutrition Follow Up Note   DOCUMENTATION CODES:   Not applicable  INTERVENTION:   Ensure Plus High Protein po TID, each supplement provides 350 kcal and 20 grams of protein  Magic cup TID with meals, each supplement provides 290 kcal and 9 grams of protein  Continue MVI, thiamine  and folic acid  daily   Ergocalciferol  50,000 units po weekly x 6 weeks   Assist with meals  Daily weights   NUTRITION DIAGNOSIS:   Inadequate oral intake related to lethargy/confusion as evidenced by NPO status. -resolving   GOAL:   Patient will meet greater than or equal to 90% of their needs -not met   MONITOR:   PO intake, Supplement acceptance, Labs, Weight trends, Skin, I & O's  ASSESSMENT:   63 y/o male with h/o HLD, etoh abuse, HTN, CAD s/p CABG x 3 (2013), severe aortic stenosis, CHF, CVA, IDA, COPD, MI, GERD, hiatal hernia, CKD III, intestional AVM, depression and recent admission for elective left heart catheterization s/p left heart cath and stent placement 7/16 and who is now admitted with NSTEMI, CHF/COPD exacerbation, suspected aspiration and etoh withdrawal.  Met with pt in room today. NGT removed yesterday. Pt reports that he is feeling much better. Pt is happy to have the tube removed from his nose. Pt continues to sound congested. Pt seen by SLP and was placed on a dysphagia 2 diet today. Pt reports that he ate all of his breakfast; pt is documented to have eaten 100%. Pt reports that he is unable to read his menu without his glasses; will add assistance with meal ordering. RD discussed with pt the importance of adequate nutrition needed to preserve lean muscle. Pt is agreeable to drink vanilla and strawberry supplements. RD will add supplements and vitamins to help pt meet his estimated needs. Pt reports that he loves apple juice, ham and [place] sandwiches. Last BM 8/5. Per chart, pt is fairly weight stable since admission.    Medications reviewed and include: vitamin C , aspirin ,  D3, plavix , colace, lovenox , folic acid , iron , MVI, protonix , miralax , thiamine   Labs reviewed: Na 144 wnl, K 4.8 wnl, BUN 54(H), P 5.0(H), Mg 2.9(H) Vitamin D  12.02(L)- 04/2024 Hgb 7.7(L), Hct 26.2(L) Cbgs- 127, 102, 121 x 24 hrs   UOP-   Diet Order:   Diet Order             DIET DYS 2 Fluid consistency: Thin  Diet effective now                  EDUCATION NEEDS:   No education needs have been identified at this time  Skin:  Skin Assessment: Reviewed RN Assessment (ecchymosis)  Last BM:  8/5- type 6  Height:   Ht Readings from Last 1 Encounters:  07/11/24 6' (1.829 m)    Weight:   Wt Readings from Last 1 Encounters:  07/25/24 101.8 kg    Ideal Body Weight:  80.9 kg  BMI:  Body mass index is 30.44 kg/m.  Estimated Nutritional Needs:   Kcal:  2300-2600kcal/day  Protein:  115-130g/day  Fluid:  2.0L/day  Augustin Shams MS, RD, LDN If unable to be reached, please send secure chat to RD inpatient available from 8:00a-4:00p daily

## 2024-07-25 NOTE — Progress Notes (Signed)
 NAME:  [name], MRN:  [id number], DOB:  February 24, 1961, LOS: 14 ADMISSION DATE:  07/11/2024, CONSULTATION DATE: 07/12/24  Brief Pt Description / Synopsis:  63 y.o. male with PMHx significant for ETOH abuse, HFrEF, CAD with recent PCI, COPD, and GI Bleed initially admitted with Acute Hypoxic Respiratory Failure due to HCAP and COPD Exacerbation.  Hospital course complicated by Acute Metabolic Encephalopathy due to severe Alcohol  Withdrawal and DT's with acute aspiration event requiring intubation for airway protection.   History of Present Illness:  63 year old male who was admitted to the hospital on 7/25 after presenting with shortness of breath and chest discomfort.   He has a known history of severe aortic stenosis as well as coronary artery disease.  He is previously had CABG as well as PCI.  He also has underlying COPD, HFpEF, alcohol  use disorder, and GI bleed.   Patient was recently in the hospital on 7/16 - 7/17 for left heart cath with stenting to bypass graft (SVG to PDA).  He developed shortness of breath with cough productive of sputum over the past few days prompting presentation to the ED yesterday.   On arrival to the ED, his lower extremities were swollen and he was found to have a wheeze.  He was hypoxic requiring oxygen support via nasal cannula.  Given respiratory distress, he was started on treatment for COPD exacerbation and admitted to the hospitalist service for further management.  CT scan of the chest was negative for PE but did note some patchy nodular airspace disease as well as airway thickening.  He was started on broad-spectrum IV antibiotics, steroids, bronchodilators, and received IV fluids for sepsis given elevated lactic acid.   This morning, he had increased shortness of breath with respiratory distress prompting initiation of BiPAP and transferred to the ICU.  He was also started on dexmedetomidine  for agitation.   Blood work showed normal kidney function, but  with rising troponin and a significantly elevated BNP. Hemoglobin has been stable.   Pertinent  Medical History  CAD s/p CABG HFpEF Severe Aortic Stenosis ETOH use COPD on Trelegy  Micro Data:  07/25: COVID>>negative  07/25: Influenza A&B>>negative  07/25: RSV>>negative  07/25: MRSA PCR>>negative  07/25: Blood>>staph epidermidis in aerobic bottle only (likely a contaminant)  07/26: RVP>>negative  07/26: Strep pneumo & Legionella urinary antigens>> negative 7/30: Tracheal aspirate>> 7/31: Blood cultures>> 7/31: MRSA PCR>> negative  Anti-infectives (From admission, onward)    Start     Dose/Rate Route Frequency Ordered Stop   07/23/24 1400  ceFEPIme  (MAXIPIME ) 2 g in sodium chloride  0.9 % 100 mL IVPB  Status:  Discontinued        2 g 200 mL/hr over 30 Minutes Intravenous Every 8 hours 07/23/24 1203 07/24/24 1028   07/17/24 1000  piperacillin -tazobactam (ZOSYN ) IVPB 3.375 g  Status:  Discontinued        3.375 g 12.5 mL/hr over 240 Minutes Intravenous Every 8 hours 07/17/24 0811 07/21/24 1100   07/17/24 1000  vancomycin  (VANCOREADY) IVPB 2000 mg/400 mL  Status:  Discontinued        2,000 mg 200 mL/hr over 120 Minutes Intravenous  Once 07/17/24 0811 07/17/24 1026   07/14/24 1200  cefTRIAXone  (ROCEPHIN ) 2 g in sodium chloride  0.9 % 100 mL IVPB        2 g 200 mL/hr over 30 Minutes Intravenous Every 24 hours 07/14/24 1030 07/16/24 1204   07/12/24 1600  piperacillin -tazobactam (ZOSYN ) IVPB 3.375 g  Status:  Discontinued  3.375 g 12.5 mL/hr over 240 Minutes Intravenous Every 8 hours 07/12/24 1503 07/14/24 1030   07/12/24 0900  vancomycin  (VANCOREADY) IVPB 1250 mg/250 mL  Status:  Discontinued        1,250 mg 166.7 mL/hr over 90 Minutes Intravenous Every 12 hours 07/11/24 2012 07/12/24 1458   07/11/24 2100  ceFEPIme  (MAXIPIME ) 2 g in sodium chloride  0.9 % 100 mL IVPB  Status:  Discontinued        2 g 200 mL/hr over 30 Minutes Intravenous Every 8 hours 07/11/24 1959 07/12/24  1458   07/11/24 2100  vancomycin  (VANCOREADY) IVPB 2000 mg/400 mL        2,000 mg 200 mL/hr over 120 Minutes Intravenous  Once 07/11/24 2004 07/11/24 2255   07/11/24 1945  cefTRIAXone  (ROCEPHIN ) 1 g in sodium chloride  0.9 % 100 mL IVPB  Status:  Discontinued        1 g 200 mL/hr over 30 Minutes Intravenous  Once 07/11/24 1930 07/11/24 1933   07/11/24 1945  azithromycin  (ZITHROMAX ) 500 mg in sodium chloride  0.9 % 250 mL IVPB  Status:  Discontinued        500 mg 250 mL/hr over 60 Minutes Intravenous  Once 07/11/24 1930 07/11/24 1955   07/11/24 1945  cefTRIAXone  (ROCEPHIN ) 2 g in sodium chloride  0.9 % 100 mL IVPB  Status:  Discontinued        2 g 200 mL/hr over 30 Minutes Intravenous  Once 07/11/24 1933 07/11/24 1955      Significant Hospital Events: Including procedures, antibiotic start and stop dates in addition to other pertinent events   7/25: admit with respiratory failure 7/26: clinically decompensated, worsening respiratory status initiated on BiPAP 7/27: on nasal cannula, reports anxiety and tremors consistent with EtOH withdrawal.  Received a total of 15 mg/kg of phenobarbital , however he remained agitated requiring low dose precedex  gtt.  7/28: Pt resting in bed no agitation on precedex  gtt @0 .5 mcg/min.  Orders placed to transfuse 1 unit of pRBC's hgb 7.2 goal hgb 8.0.  No signs of active bleeding  7/29: Pt remains encephalopathic with severe DT's requiring precedex  gtt.  NGT placed to initiate tube feeds and administer medications  7/30: Earlier this morning with vomiting, concern for aspiration event as now with increased WOB and increasing FiO2 requirements (up to 4L). Remains encephalopathic on Precedex .  Respiratory status continued to decline, progressed to agonal respirations, Required INTUBATION for airway protection.  Hypotensive with induction meds despite IV fluids and Levophed  resulting in brief loss of pulse (ROSC obtained after 2 minutes of CPR and 1 epi).  Central line  placed due to vasopressors. 7/31: Overnight with episode on non-sustained SVT which converted spontaneously, remains on Amiodarone  gtt per Cardiology.  Nursing reported black tarry stools overnight, Hgb remains stable.   Levophed  weaned off, on minimal vent settings, will perform WUA and SBT as mental status allows.  With fever of 101 F overnight and WBC nearly doubled, tracheal aspirate results pending, will check blood cultures and UA, broaden to empiric Vancomycin  and Zosyn  pending culture results.   08/01:  No significant events reported overnight. Received 1 unit pRBC's overnight for Hgb 7.5 with appropriate response to 8.8 and has since remained stable with no reports of bleeding from nursing.  Fevers resolved, WBC improved with broadening to Zosyn  yesterday, cultures currently with no growth.  Weaned off levophed .  On minimal vent support, passed WUA and SBT, EXTUBATED TO HHFNC.  Diuresed with 20 mg IV Lasix  x1 dose with  extubation, needs aggressive pulmonary toilet. 8/2-8/5 progressive SOB and high risk for aspiration 8/7 placed on high flow [place] 70% 8/8 FiO2 (%):  [30 %-60 %] 30 %   Interim History / Subjective:  WOB improved Tolerated lasix , fio2 improving Await speech eval  Objective    Blood pressure 115/63, pulse 78, temperature 98.6 F (37 C), temperature source Oral, resp. rate 14, height 6' (1.829 m), weight 101.8 kg, SpO2 100%.    FiO2 (%):  [30 %-60 %] 30 %   Intake/Output Summary (Last 24 hours) at 07/25/2024 0739 Last data filed at 07/25/2024 0400 Gross per 24 hour  Intake 686 ml  Output 2350 ml  Net -1664 ml   Filed Weights   07/23/24 0500 07/24/24 0500 07/25/24 0500  Weight: 98.5 kg 101.8 kg 101.8 kg     REVIEW OF SYSTEMS SOB IMPROVING WITH LASIX    PHYSICAL EXAMINATION:  GENERAL:critically ill appearing EYES: Pupils equal, round, reactive to light.  No scleral icterus.  MOUTH: Moist mucosal membrane. NECK: Supple.  PULMONARY: Lungs clear to auscultation,  +rhonchi CARDIOVASCULAR: S1 and S2.  Regular rate and rhythm GASTROINTESTINAL: Soft, nontender, -distended. Positive bowel sounds.  MUSCULOSKELETAL: No edema.  NEUROLOGIC: alert and awake SKIN:normal, warm to touch, Capillary refill delayed  Pulses present bilaterally   Assessment and Plan   63 yo white male admitted for acute COPD exacerbation with acute aspiration pneumonia  in the setting of severe ametabolic encephalopathy due to alcohol  withdrawal leading to severe hypoxic resp failure complicated by cardiac arrest with  intubation, s/p extubation developing progressive resp failure with signs and symptoms of aspiration   Severe ACUTE Hypoxic and Hypercapnic Respiratory Failure Slowly resolving and improving with lasix  -Wean Fio2 as tolerted - Intermittent chest x-ray & ABG PRN - Ensure adequate pulmonary hygiene   CARDIAC Brief Cardiac Arrest due to severe Hypotension from induction meds (ROSC obtained after 2 minutes of CPR and 1 epi) Severe aortic stenosis  Elevated troponin's secondary to NSTEMI  Acute on Chronic HFpEF  Hx: HTN, MI, CAD, and stroke  -Echocardiogram 05/10/24: LVEF 60-65%, grade I DD, RV systolic function and size normal, severe Aortic Stenosis -Cardiac Cath 07/02/24: Severe native 3 vessel disease, successful stenting of distal SVG to PDA with DES -Continuous cardiac monitoring -Maintain MAP >65 -Lactic acid has normalized - Cardiology following, appreciate input ~ pt to follow-up outpatient to discuss aortic valve repair/replacement options.  Goal is to optimize him regarding volume and symptom standpoint prior to discharge  - Continue aspirin , plavix , and rosuvastatin   -Amiodarone  gtt transitioned to PO Amiodarone  on 8/1  Aspiration pneumonia  -Monitor fever curve -Trend WBC's & Procalcitonin -Follow cultures as above Antibiotics Given (last 72 hours)     Date/Time Action Medication Dose Rate   07/23/24 1339 New Bag/Given   ceFEPIme  (MAXIPIME ) 2 g  in sodium chloride  0.9 % 100 mL IVPB 2 g 200 mL/hr   07/23/24 2201 New Bag/Given   ceFEPIme  (MAXIPIME ) 2 g in sodium chloride  0.9 % 100 mL IVPB 2 g 200 mL/hr   07/24/24 0543 New Bag/Given   ceFEPIme  (MAXIPIME ) 2 g in sodium chloride  0.9 % 100 mL IVPB 2 g 200 mL/hr        ENDO - ICU hypoglycemic\Hyperglycemia protocol -check FSBS per protocol   GI GI PROPHYLAXIS as indicated NUTRITIONAL STATUS DIET-->NG removed, awaiting speech Constipation protocol as indicated   ELECTROLYTES -follow labs as needed -replace as needed -pharmacy consultation and following     Best Practice (right click and Reselect  all SmartList Selections daily)   Diet/type: NPO; Tube feeds via NGT until passes speech evaluation DVT prophylaxis Lovenox  Pressure ulcer(s): N/A GI prophylaxis: PPI Lines: Left internal jugular CVC placed 7/39, and is still needed Foley:  yes and is still needed Code Status:  full code Last date of multidisciplinary goals of care discussion [07/18/24]   Labs   CBC: Recent Labs  Lab 07/21/24 0455 07/22/24 0416 07/23/24 0457 07/24/24 0406 07/25/24 0439  WBC 10.5 14.0* 18.8* 15.7* 10.5  HGB 8.0* 8.6* 9.3* 8.1* 7.7*  HCT 26.9* 28.3* 31.0* 27.5* 26.2*  MCV 92.4 91.6 92.5 93.9 95.6  PLT 211 247 247 246 231    Basic Metabolic Panel: Recent Labs  Lab 07/19/24 0559 07/20/24 0531 07/21/24 0455 07/22/24 0416 07/23/24 0457 07/24/24 0406 07/25/24 0439  NA 145 147* 145 146* 142 141 144  K 3.7 4.4 4.0 3.9 4.1 4.3 4.8  CL 108 109 105 104 101 99 99  CO2 32 29 33* 33* 32 33* 34*  GLUCOSE 167* 178* 193* 186* 138* 160* 125*  BUN 23 27* 33* 36* 37* 48* 54*  CREATININE 0.85 1.01 1.15 0.97 1.12 1.05 1.15  CALCIUM  8.0* 8.6* 8.4* 8.6* 8.6* 8.7* 8.5*  MG 2.4  --   --   --  2.7*  --  2.9*  PHOS 3.5 3.1 3.4  --  4.6 4.9* 5.0*   GFR: Estimated Creatinine Clearance: 79.1 mL/min (by C-G formula based on SCr of 1.15 mg/dL). Recent Labs  Lab 07/22/24 0416 07/23/24 0457  07/24/24 0406 07/25/24 0439  PROCALCITON  --  0.26  --   --   WBC 14.0* 18.8* 15.7* 10.5    Liver Function Tests: Recent Labs  Lab 07/19/24 0559 07/20/24 0531 07/21/24 0455 07/24/24 0406  ALBUMIN 2.6* 3.0* 2.7* 2.9*   No results for input(s): LIPASE, AMYLASE in the last 168 hours. No results for input(s): AMMONIA in the last 168 hours.   ABG    Component Value Date/Time   PHART 7.47 (H) 07/18/2024 0347   PCO2ART 43 07/18/2024 0347   PO2ART 86 07/18/2024 0347   HCO3 31.3 (H) 07/18/2024 0347   ACIDBASEDEF 0.8 07/16/2024 0915   O2SAT 98.2 07/18/2024 0347     Coagulation Profile: No results for input(s): INR, PROTIME in the last 168 hours.   Cardiac Enzymes: No results for input(s): CKTOTAL, CKMB, CKMBINDEX, TROPONINI in the last 168 hours.  HbA1C: Hemoglobin A1C  Date/Time Value Ref Range Status  12/02/2013 03:52 PM 5.3 4.2 - 6.3 % Final    Comment:    The American Diabetes Association recommends that a primary goal of therapy should be <7% and that physicians should reevaluate the treatment regimen in patients with HbA1c values consistently >8%.    Hgb A1c MFr Bld  Date/Time Value Ref Range Status  10/28/2022 04:37 AM 5.3 4.8 - 5.6 % Final    Comment:    (NOTE) Pre diabetes:          5.7%-6.4%  Diabetes:              >6.4%  Glycemic control for   <7.0% adults with diabetes     CBG: Recent Labs  Lab 07/24/24 0713 07/24/24 1126 07/24/24 1625 07/24/24 2318 07/25/24 0319  GLUCAP 116* 140* 130* 98 121*    Review of Systems:   Unable to assess pt sedated/AMS/Critical illness  Past Medical History:  He,  has a past medical history of COPD (chronic obstructive pulmonary disease) (HCC), CVA (cerebral infarction), Headache,  Hypertension, MI (myocardial infarction) (HCC) (05/09/2012), Reading difficulty, Stroke [facility]) (2012), and Wears dentures.   Surgical History:   Past Surgical History:  Procedure Laterality Date   CAROTID  ENDARTERECTOMY Left    2012 or 2013   CHOLECYSTECTOMY N/A 07/26/2016   Procedure: LAPAROSCOPIC CHOLECYSTECTOMY;  Surgeon: Charlie FORBES Fell, MD;  Location: [facility] ORS;  Service: General;  Laterality: N/A;   COLONOSCOPY N/A 05/08/2024   Procedure: COLONOSCOPY;  Surgeon: Jinny Carmine, MD;  Location: [facility] ENDOSCOPY;  Service: Endoscopy;  Laterality: N/A;   COLONOSCOPY WITH PROPOFOL  N/A 02/09/2022   Procedure: COLONOSCOPY WITH PROPOFOL ;  Surgeon: Jinny Carmine, MD;  Location: [facility] ENDOSCOPY;  Service: Endoscopy;  Laterality: N/A;   CORONARY ARTERY BYPASS GRAFT  05/10/2012   3 vessel   CORONARY STENT INTERVENTION N/A 07/02/2024   Procedure: CORONARY STENT INTERVENTION;  Surgeon: Katina Albright, MD;  Location: [facility] INVASIVE CV LAB;  Service: Cardiovascular;  Laterality: N/A;   CORONARY ULTRASOUND/IVUS N/A 07/02/2024   Procedure: Coronary Ultrasound/IVUS;  Surgeon: Katina Albright, MD;  Location: [facility] INVASIVE CV LAB;  Service: Cardiovascular;  Laterality: N/A;   ENTEROSCOPY N/A 05/16/2023   Procedure: ENTEROSCOPY;  Surgeon: Therisa Bi, MD;  Location: [facility] ENDOSCOPY;  Service: Gastroenterology;  Laterality: N/A;   ENTEROSCOPY N/A 06/01/2023   Procedure: ENTEROSCOPY;  Surgeon: Unk Corinn Skiff, MD;  Location: [facility] ENDOSCOPY;  Service: Gastroenterology;  Laterality: N/A;   ESOPHAGOGASTRODUODENOSCOPY  05/13/2023   Procedure: ESOPHAGOGASTRODUODENOSCOPY (EGD);  Surgeon: Jinny Carmine, MD;  Location: [facility] ENDOSCOPY;  Service: Endoscopy;;   ESOPHAGOGASTRODUODENOSCOPY (EGD) WITH PROPOFOL  N/A 10/28/2022   Procedure: ESOPHAGOGASTRODUODENOSCOPY (EGD) WITH PROPOFOL ;  Surgeon: Onita Elspeth Sharper, DO;  Location: [facility] ENDOSCOPY;  Service: Gastroenterology;  Laterality: N/A;   GIVENS CAPSULE STUDY  05/13/2023   Procedure: GIVENS CAPSULE STUDY;  Surgeon: Jinny Carmine, MD;  Location: [facility] ENDOSCOPY;  Service: Endoscopy;;   GIVENS CAPSULE STUDY  06/01/2023   Procedure: GIVENS CAPSULE STUDY;  Surgeon: Unk Corinn Skiff, MD;   Location: [facility] ENDOSCOPY;  Service: Gastroenterology;;   LEFT HEART CATH AND CORS/GRAFTS ANGIOGRAPHY N/A 07/02/2024   Procedure: LEFT HEART CATH AND CORS/GRAFTS ANGIOGRAPHY;  Surgeon: Katina Albright, MD;  Location: [facility] INVASIVE CV LAB;  Service: Cardiovascular;  Laterality: N/A;     Social History:   reports that he quit smoking about 32 years ago. His smoking use included cigarettes. His smokeless tobacco use includes snuff. He reports current alcohol  use of about 26.0 standard drinks of alcohol  per week. He reports that he does not use drugs.   Family History:  His family history includes Pneumonia in his mother.   Allergies Allergies  Allergen Reactions   Wellbutrin  [Bupropion ]     Paranoia      Home Medications  Prior to Admission medications   Medication Sig Start Date End Date Taking? Authorizing Provider  albuterol  (VENTOLIN  HFA) 108 (90 Base) MCG/ACT inhaler Inhale 2 puffs into the lungs every 6 (six) hours as needed for wheezing or shortness of breath. 07/03/24  Yes Von Bellis, MD  ascorbic acid  (VITAMIN C ) 500 MG tablet Take 1 tablet (500 mg total) by mouth daily. 07/03/24 10/01/24 Yes Von Bellis, MD  aspirin  EC 81 MG tablet Take 1 tablet (81 mg total) by mouth daily. Swallow whole. 07/03/24 12/30/24 Yes Von Bellis, MD  clopidogrel  (PLAVIX ) 75 MG tablet Take 1 tablet (75 mg total) by mouth daily with breakfast. 07/03/24  Yes Von Bellis, MD  dapagliflozin  propanediol (FARXIGA ) 10 MG TABS tablet Take 1 tablet (10 mg total) by mouth daily. 07/03/24  Yes Von Bellis, MD  folic acid  (FOLVITE ) 1 MG tablet Take 1 tablet (1 mg total) by mouth daily. 07/03/24 10/01/24 Yes Von Bellis, MD  iron  polysaccharides (NIFEREX) 150 MG capsule Take 1 capsule (150 mg total) by mouth daily. 07/03/24 10/01/24 Yes Von Bellis, MD  isosorbide  mononitrate (IMDUR ) 60 MG 24 hr tablet Take 1 tablet (60 mg total) by mouth daily. 07/03/24 07/03/25 Yes Von Bellis, MD  losartan  (COZAAR ) 25 MG tablet  Take 0.5 tablets (12.5 mg total) by mouth daily. 07/04/24 07/04/25 Yes Von Bellis, MD  metoprolol  succinate (TOPROL -XL) 25 MG 24 hr tablet Take 0.5 tablets (12.5 mg total) by mouth daily. 07/03/24  Yes Von Bellis, MD  pantoprazole  (PROTONIX ) 40 MG tablet Take 1 tablet (40 mg total) by mouth daily. 07/03/24 10/01/24 Yes Von Bellis, MD  pravastatin  (PRAVACHOL ) 20 MG tablet Take 1 tablet (20 mg total) by mouth at bedtime. 07/03/24 07/03/25 Yes Von Bellis, MD  spironolactone  (ALDACTONE ) 25 MG tablet Take 0.5 tablets (12.5 mg total) by mouth daily. 07/03/24  Yes Von Bellis, MD  torsemide  (DEMADEX ) 10 MG tablet Take 1 tablet (10 mg total) by mouth daily. 07/03/24  Yes Von Bellis, MD    Vital signs reviewed, ICU needs resolved  Will sign off at this time. No further recommendations at this time.   Nickolas Alm Cellar, M.D.  [facility]  Medical Director [facility] Medical Director [facility] Cardio-Pulmonary Department

## 2024-07-25 NOTE — Progress Notes (Addendum)
 [facility] CLINIC CARDIOLOGY PROGRESS NOTE       Patient ID: [name] MRN: [id number] DOB/AGE: June 10, 1961 63 y.o.  Admit date: 07/11/2024 Referring Physician Dr. Charlie Sellar Primary Physician Osa Geralds, NP  Primary Cardiologist Tinnie Maiden, NP  Reason for Consultation elevated troponins  HPI: [name] is a 63 y.o. 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 Found to have pneumonia, overnight troponins uptrending. Cardiology was consulted for further evaluation.   Interval history: -Patient seen and examined this AM, resting in hospital bed in no acute distress. Patient states SOB feels a lot better today s/p NGT removal and s/p IV lasix . Patient denies chest pain. -Patient BP and HR stable this AM. No significant events on telemetry.  -Bun:Cr ratio 54:1, concerning for pre-renal azotemia, likely related to fluid overload with bibasilar crackles on exam. Good UOP yesterday 2.35L with stable Cr.  Review of systems complete and found to be negative unless listed above    Past Medical History:  Diagnosis Date   COPD (chronic obstructive pulmonary disease) (HCC)    CVA (cerebral infarction)    Headache    mild, since stroke 2012   Hypertension    MI (myocardial infarction) (HCC) 05/09/2012   Reading difficulty    pt reports he reads at about a second grade level   Stroke [facility]) 2012   numbness to left hand    Wears dentures    full upper and lower    Past Surgical History:  Procedure Laterality Date   CAROTID ENDARTERECTOMY Left    2012 or 2013   CHOLECYSTECTOMY N/A 07/26/2016   Procedure: LAPAROSCOPIC CHOLECYSTECTOMY;   Surgeon: Charlie FORBES Fell, MD;  Location: [facility] ORS;  Service: General;  Laterality: N/A;   COLONOSCOPY N/A 05/08/2024   Procedure: COLONOSCOPY;  Surgeon: Jinny Carmine, MD;  Location: [facility] ENDOSCOPY;  Service: Endoscopy;  Laterality: N/A;   COLONOSCOPY WITH PROPOFOL  N/A 02/09/2022   Procedure: COLONOSCOPY WITH PROPOFOL ;  Surgeon: Jinny Carmine, MD;  Location: [facility] ENDOSCOPY;  Service: Endoscopy;  Laterality: N/A;   CORONARY ARTERY BYPASS GRAFT  05/10/2012   3 vessel   CORONARY STENT INTERVENTION N/A 07/02/2024   Procedure: CORONARY STENT INTERVENTION;  Surgeon: Katina Albright, MD;  Location: [facility] INVASIVE CV LAB;  Service: Cardiovascular;  Laterality: N/A;   CORONARY ULTRASOUND/IVUS N/A 07/02/2024   Procedure: Coronary Ultrasound/IVUS;  Surgeon: Katina Albright, MD;  Location: [facility] INVASIVE CV LAB;  Service: Cardiovascular;  Laterality: N/A;   ENTEROSCOPY N/A 05/16/2023   Procedure: ENTEROSCOPY;  Surgeon: Therisa Bi, MD;  Location: [facility] ENDOSCOPY;  Service: Gastroenterology;  Laterality: N/A;   ENTEROSCOPY N/A 06/01/2023   Procedure: ENTEROSCOPY;  Surgeon: Unk Corinn Skiff, MD;  Location: [facility] ENDOSCOPY;  Service: Gastroenterology;  Laterality: N/A;   ESOPHAGOGASTRODUODENOSCOPY  05/13/2023   Procedure: ESOPHAGOGASTRODUODENOSCOPY (EGD);  Surgeon: Jinny Carmine, MD;  Location: [facility] ENDOSCOPY;  Service: Endoscopy;;   ESOPHAGOGASTRODUODENOSCOPY (EGD) WITH PROPOFOL  N/A 10/28/2022   Procedure: ESOPHAGOGASTRODUODENOSCOPY (EGD) WITH PROPOFOL ;  Surgeon: Onita Elspeth Sharper, DO;  Location: [facility] ENDOSCOPY;  Service: Gastroenterology;  Laterality: N/A;   GIVENS CAPSULE STUDY  05/13/2023   Procedure: GIVENS CAPSULE STUDY;  Surgeon: Jinny Carmine, MD;  Location: [facility] ENDOSCOPY;  Service: Endoscopy;;   GIVENS CAPSULE STUDY  06/01/2023   Procedure: GIVENS CAPSULE STUDY;  Surgeon: Unk Corinn Skiff, MD;  Location: [facility] ENDOSCOPY;  Service: Gastroenterology;;   LEFT HEART CATH AND CORS/GRAFTS ANGIOGRAPHY N/A 07/02/2024    Procedure: LEFT HEART CATH AND CORS/GRAFTS ANGIOGRAPHY;  Surgeon: Katina Albright, MD;  Location: [facility] INVASIVE CV LAB;  Service: Cardiovascular;  Laterality: N/A;    Medications Prior to Admission  Medication Sig Dispense Refill Last Dose/Taking   albuterol  (VENTOLIN  HFA) 108 (90 Base) MCG/ACT inhaler Inhale 2 puffs into the lungs every 6 (six) hours as needed for wheezing or shortness of breath. 6.7 g 2 07/11/2024 Morning   ascorbic acid  (VITAMIN C ) 500 MG tablet Take 1 tablet (500 mg total) by mouth daily. 30 tablet 2 07/11/2024 Morning   aspirin  EC 81 MG tablet Take 1 tablet (81 mg total) by mouth daily. Swallow whole. 30 tablet 5 07/11/2024 Morning   clopidogrel  (PLAVIX ) 75 MG tablet Take 1 tablet (75 mg total) by mouth daily with breakfast. 30 tablet 11 07/11/2024 Morning   dapagliflozin  propanediol (FARXIGA ) 10 MG TABS tablet Take 1 tablet (10 mg total) by mouth daily. 30 tablet 11 07/11/2024 Morning   folic acid  (FOLVITE ) 1 MG tablet Take 1 tablet (1 mg total) by mouth daily. 30 tablet 2 07/11/2024   iron  polysaccharides (NIFEREX) 150 MG capsule Take 1 capsule (150 mg total) by mouth daily. 30 capsule 2 07/11/2024   isosorbide  mononitrate (IMDUR ) 60 MG 24 hr tablet Take 1 tablet (60 mg total) by mouth daily. 30 tablet 11 07/11/2024   losartan  (COZAAR ) 25 MG tablet Take 0.5 tablets (12.5 mg total) by mouth daily. 30 tablet 11 07/11/2024 Morning   metoprolol  succinate (TOPROL -XL) 25 MG 24 hr tablet Take 0.5 tablets (12.5 mg total) by mouth daily. 30 tablet 11 07/11/2024 Morning   pantoprazole  (PROTONIX ) 40 MG tablet Take 1 tablet (40 mg total) by mouth daily. 30 tablet 2 07/11/2024 Morning   pravastatin  (PRAVACHOL ) 20 MG tablet Take 1 tablet (20 mg total) by mouth at bedtime. 30 tablet 11 07/10/2024 Evening   spironolactone  (ALDACTONE ) 25 MG tablet Take 0.5 tablets (12.5 mg total) by mouth daily. 30 tablet 11 07/11/2024 Morning   torsemide  (DEMADEX ) 10 MG tablet Take 1 tablet (10 mg total) by mouth daily. 30  tablet 11 07/11/2024   Social History   Socioeconomic History   Marital status: Divorced    Spouse name: Not on file   Number of children: Not on file   Years of education: Not on file   Highest education level: Not on file  Occupational History   Not on file  Tobacco Use   Smoking status: Former    Current packs/day: 0.00    Types: Cigarettes    Quit date: 72    Years since quitting: 32.6   Smokeless tobacco: Current    Types: Snuff   Tobacco comments:    changed to dip  Vaping Use   Vaping status: Never Used  Substance and Sexual Activity   Alcohol  use: Yes    Alcohol /week: 26.0 standard drinks of alcohol     Types: 26 Cans of beer per week   Drug use: No   Sexual activity: Yes    Birth control/protection: None  Other Topics Concern   Not on file  Social History Narrative   Not on file   Social Drivers of Health   Financial Resource Strain: Not on file  Food Insecurity: No Food Insecurity (07/12/2024)   Hunger  Vital Sign    Worried About Programme researcher, broadcasting/film/video in the Last Year: Never true    Ran Out of Food in the Last Year: Never true  Transportation Needs: No Transportation Needs (07/12/2024)   PRAPARE - Administrator, Civil Service (Medical): No    Lack of Transportation (Non-Medical): No  Physical Activity: Not on file  Stress: Not on file  Social Connections: Socially Isolated (07/12/2024)   Social Connection and Isolation Panel    Frequency of Communication with Friends and Family: Once a week    Frequency of Social Gatherings with Friends and Family: Once a week    Attends Religious Services: Never    Database administrator or Organizations: No    Attends Banker Meetings: Never    Marital Status: Divorced  Catering manager Violence: Not At Risk (07/12/2024)   Humiliation, Afraid, Rape, and Kick questionnaire    Fear of Current or Ex-Partner: No    Emotionally Abused: No    Physically Abused: No    Sexually Abused: No     Family History  Problem Relation Age of Onset   Pneumonia Mother      Vitals:   07/25/24 0800 07/25/24 0900 07/25/24 1116 07/25/24 1120  BP: (!) 149/72 137/67    Pulse: 88 89    Resp: 17 17    Temp:  98.3 F (36.8 C)    TempSrc:  Axillary    SpO2: 100% 100% 100% 98%  Weight:      Height:        PHYSICAL EXAM General: Ill appearing male, no acute distress. HEENT: Normocephalic and atraumatic. Neck: No JVD.  Lungs: Normal respiratory effort.  Diminished breath sounds with bibasilar crackles. Heart: HRRR, Normal S1 and S2 without gallops. +systolic ejection murmur.   Abdomen: Non-distended appearing.  Msk: Normal strength and tone for age. Extremities: Warm and well perfused. No clubbing, cyanosis, trace pedal edema.   Labs: Basic Metabolic Panel: Recent Labs    07/23/24 0457 07/24/24 0406 07/25/24 0439  NA 142 141 144  K 4.1 4.3 4.8  CL 101 99 99  CO2 32 33* 34*  GLUCOSE 138* 160* 125*  BUN 37* 48* 54*  CREATININE 1.12 1.05 1.15  CALCIUM  8.6* 8.7* 8.5*  MG 2.7*  --  2.9*  PHOS 4.6 4.9* 5.0*   Liver Function Tests: Recent Labs    07/24/24 0406  ALBUMIN 2.9*   No results for input(s): LIPASE, AMYLASE in the last 72 hours. CBC: Recent Labs    07/24/24 0406 07/25/24 0439  WBC 15.7* 10.5  HGB 8.1* 7.7*  HCT 27.5* 26.2*  MCV 93.9 95.6  PLT 246 231   Cardiac Enzymes: Recent Labs    07/24/24 0406 07/24/24 0718 07/24/24 0922  TROPONINIHS 72* 75* 68*     BNP: Recent Labs    07/24/24 0718  BNP 3,252.3*    D-Dimer: No results for input(s): DDIMER in the last 72 hours. Hemoglobin A1C: No results for input(s): HGBA1C in the last 72 hours. Fasting Lipid Panel: No results for input(s): CHOL, HDL, LDLCALC, TRIG, CHOLHDL, LDLDIRECT in the last 72 hours.  Thyroid Function Tests: No results for input(s): TSH, T4TOTAL, T3FREE, THYROIDAB in the last 72 hours.  Invalid input(s): FREET3 Anemia Panel: No results for  input(s): VITAMINB12, FOLATE, FERRITIN, TIBC, IRON , RETICCTPCT in the last 72 hours.   Radiology: DG Chest 1 View Result Date: 07/24/2024 CLINICAL DATA:  [id number] Dyspnea [id number] EXAM: CHEST  1 VIEW  COMPARISON:  July 23, 2024, July 11, 2024 FINDINGS: Esophagogastric tube terminates below the diaphragm, outside the field of view. Diffuse interstitial opacities throughout both lungs. No lobar consolidation or pneumothorax. Lateral right upper lobe nodular opacity measures 1.9 cm, corresponding to the lung nodule on the prior chest CT. Moderate cardiomegaly. Sternotomy wires and CABG markers. No acute fracture or destructive lesions. Multilevel thoracic osteophytosis. IMPRESSION: 1. Moderate cardiomegaly. Unchanged interstitial opacities throughout both lungs, possibly persistent interstitial edema and underlying emphysematous changes. 2. Similarly appearing lateral right upper lung zone nodular opacity. Please see the comparison chest CT for further characterization. Electronically Signed   By: Rogelia Myers M.D.   On: 07/24/2024 08:43   DG Chest Port 1 View Result Date: 07/23/2024 CLINICAL DATA:  Shortness of breath. EXAM: PORTABLE CHEST 1 VIEW COMPARISON:  July 22, 2024. FINDINGS: The heart size and mediastinal contours are within normal limits. Status post coronary bypass graft. Mild diffuse interstitial densities are noted concerning for possible pulmonary edema with possible small pleural effusions. Nasogastric tube is seen entering stomach. The visualized skeletal structures are unremarkable. IMPRESSION: Mild diffuse interstitial densities are noted concerning for possible pulmonary edema with small pleural effusions. Electronically Signed   By: Lynwood Landy Raddle M.D.   On: 07/23/2024 13:03   DG ABD ACUTE 2+V W 1V CHEST Result Date: 07/22/2024 CLINICAL DATA:  [id number] Dyspnea [id number] [phone] Encounter for imaging study to confirm nasogastric (NG) tube placement [id number] EXAM: DG ABDOMEN ACUTE WITH 1  VIEW CHEST COMPARISON:  Chest x-ray 07/19/2024 FINDINGS: Enteric tube courses below the diaphragm with tip and side port overlying the expected region of the gastric lumen. The heart and mediastinal contours are within normal limits. No focal consolidation. Chronic coarsened interstitial markings with no overt pulmonary edema. Bilateral trace pleural effusion. No pneumothorax. Right upper quadrant surgical clips. There is no evidence of dilated bowel loops or free intraperitoneal air. No radiopaque calculi or other significant radiographic abnormality is seen. No acute osseous abnormality.  Intact sternotomy wires. IMPRESSION: 1. Enteric tube in good position. 2. Bowel trace pleural effusions. 3. Nonobstructive bowel gas pattern. Electronically Signed   By: Morgane  Naveau M.D.   On: 07/22/2024 14:18   DG Chest Port 1 View Result Date: 07/19/2024 CLINICAL DATA:  Dyspnea EXAM: PORTABLE CHEST 1 VIEW COMPARISON:  07/18/2024 FINDINGS: Two frontal views of the chest demonstrate an enteric catheter passing below diaphragm, tip excluded by collimation. Left internal jugular catheter tip overlies superior vena cava unchanged. 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 The cardiac silhouette is stable. Mild central vascular congestion, with a trace right pleural effusion unchanged since prior exam. The nodular density projecting over the right upper lateral chest on prior study is less well seen on this exam. This may be due to positioning. No pneumothorax. No acute bony abnormalities. IMPRESSION: 1. Pulmonary vascular congestion and small right pleural effusion, unchanged since prior study. 2. The nodular consolidation within the right upper lateral chest seen on prior x-ray and CT is not well seen on this exam, likely due to patient positioning. Electronically Signed   By: Ozell Daring M.D.   On: 07/19/2024 17:34   DG Chest Port 1 View Result Date: 07/18/2024 EXAM: 1 VIEW XRAY OF THE CHEST 07/18/2024 04:59:06 AM  COMPARISON: 07/16/2024 CLINICAL HISTORY: [id number] Acute respiratory failure with hypoxia (HCC) 427266. Acute respiratory failure with hypoxia FINDINGS: LUNGS AND PLEURA: Nodular opacity within the periphery of the right upper lobe appears similar to CT from 07/11/2024. Please refer to that report  for follow-up recommendations. Mild asymmetric hazy opacification over the right mid and right lower lung may be related to positional artifact versus small posterior layering effusion. Mild subsegmental atelectasis in the left base. HEART AND MEDIASTINUM: Stable cardiomediastinal contours. Previous median sternotomy and CABG procedure. BONES AND SOFT TISSUES: Aortic atherosclerotic calcification. Left IJ catheter is identified with the tip in the projection of the SVC. There is an enteric tube which courses below the field of view. ET tube tip is stable above the carina. IMPRESSION: 1. Stable nodular opacity within the periphery of the right upper lobe, similar to CT from 07/11/2024. Please refer to that report for follow-up recommendations. 2. Mild asymmetric hazy opacification over the right mid and right lower lung, possibly related to rotational artifact versus small posterior layering effusion. 3. Mild subsegmental atelectasis in the left base. Electronically signed by: Waddell Calk MD 07/18/2024 06:51 AM EDT RP Workstation: [id number]   DG Chest Port 1 View Result Date: 07/16/2024 CLINICAL DATA:  Endotracheal tube and central line placement. EXAM: PORTABLE CHEST 1 VIEW COMPARISON:  07/16/2024 at 8:58 a.m. FINDINGS: Patient is rotated to the right. Endotracheal tube has tip proximally 13.5 cm above the carina. This could be advanced approximately 6 cm. Left IJ central venous catheter has tip over the SVC. Enteric tube courses into the region of the stomach and off the image as tip is not visualized. Lungs are adequately inflated with possible mild hazy opacification over the right upper lung abutting the minor  fissure which may be due to atelectasis or infection. Left lung is clear. Cardiomediastinal silhouette and remainder of the exam is unchanged. IMPRESSION: 1. Endotracheal tube has tip proximally 13.5 cm above the carina. This could be advanced approximately 6 cm. 2. Left IJ central venous catheter with tip over the SVC. 3. Possible mild hazy opacification over the right upper lung abutting the minor fissure which may be due to atelectasis or infection. Electronically Signed   By: Toribio Agreste M.D.   On: 07/16/2024 10:53   DG Chest Port 1 View Result Date: 07/16/2024 CLINICAL DATA:  [id number] Aspiration into airway [id number]. EXAM: PORTABLE CHEST 1 VIEW COMPARISON:  06/23/2024. FINDINGS: Bilateral lungs appear hyperlucent with coarse bronchovascular markings, in keeping with COPD. Bilateral lungs otherwise appear clear. No dense consolidation or lung collapse. Bilateral costophrenic angles are clear. Stable cardio-mediastinal silhouette. There are surgical staples along the heart border and sternotomy wires, status post CABG (coronary artery bypass graft). No acute osseous abnormalities. The soft tissues are within normal limits. Enteric tube is seen coursing below the left hemidiaphragm however, the tip is not included on the film. IMPRESSION: No active disease. COPD. Electronically Signed   By: Ree Molt M.D.   On: 07/16/2024 09:09   DG Abd 1 View Result Date: 07/16/2024 CLINICAL DATA:  Vomiting.  NG tube placement. EXAM: ABDOMEN - 1 VIEW COMPARISON:  07/15/2024 FINDINGS: Nasogastric tube has tip and side-port over the stomach in the upper abdomen just left of midline. Visualized lower thorax unchanged. Abdominopelvic images demonstrate a non obstructive bowel gas pattern. Mild fecal retention throughout the colon. Surgical clips over the right upper quadrant. There are degenerative changes of the spine and hips. Calcified plaque over the abdominal aorta and iliac arteries. IMPRESSION: 1. Nasogastric tube  with tip and side-port over the stomach. 2. Nonobstructive bowel gas pattern. Mild fecal retention. 3. Aortic atherosclerosis. Electronically Signed   By: Toribio Agreste M.D.   On: 07/16/2024 07:51   DG Abd 1 View Result Date:  07/15/2024 CLINICAL DATA:  Orogastric tube placement. EXAM: ABDOMEN - 1 VIEW COMPARISON:  June 29, 2023. FINDINGS: Distal tip of nasogastric tube is seen in expected position of proximal stomach. IMPRESSION: Nasogastric tube tip seen in expected position of proximal stomach. Electronically Signed   By: Lynwood Landy Raddle M.D.   On: 07/15/2024 10:25   DG Chest Port 1 View Result Date: 07/13/2024 CLINICAL DATA:  Respiratory failure with hypoxia. EXAM: PORTABLE CHEST 1 VIEW COMPARISON:  Multiple previous chest x-rays and recent chest CT. FINDINGS: Stable surgical changes from bypass surgery. The cardiac silhouette, mediastinal and hilar contours are within normal limits and stable. Underlying emphysematous changes and pulmonary scarring. No pleural effusions or discrete pulmonary infiltrates. No pneumothorax. IMPRESSION: Emphysematous changes and pulmonary scarring but no acute overlying pulmonary process. Electronically Signed   By: MYRTIS Stammer M.D.   On: 07/13/2024 16:08   US  Abdomen Limited RUQ (LIVER/GB) Result Date: 07/13/2024 CLINICAL DATA:  Portal hypertension. EXAM: ULTRASOUND ABDOMEN LIMITED RIGHT UPPER QUADRANT COMPARISON:  Abdominal ultrasound 10/19/2022 FINDINGS: Gallbladder: Surgically absent. Common bile duct: Diameter: 3.3 mm Liver: No focal lesion identified. Parenchymal echogenicity is increased and diffusely heterogeneous. There is lobular liver contour. Portal vein is patent on color Doppler imaging with normal direction of blood flow towards the liver. Other: None. IMPRESSION: Lobular liver contour with increased echogenicity and heterogeneous parenchyma consistent with cirrhosis. No focal liver lesion identified. Electronically Signed   By: Greig Pique M.D.   On:  07/13/2024 16:03   DG Chest Port 1 View Result Date: 07/12/2024 CLINICAL DATA:  Shortness of breath. EXAM: PORTABLE CHEST 1 VIEW COMPARISON:  07/11/2024 FINDINGS: The cardio pericardial silhouette is enlarged. There is pulmonary vascular congestion without overt pulmonary edema. Trace bilateral pleural effusions. Old posterior left sixth rib fracture. Telemetry leads overlie the chest. IMPRESSION: Enlargement of the cardiopericardial silhouette with new pulmonary vascular congestion and trace bilateral pleural effusions. Electronically Signed   By: Camellia Candle M.D.   On: 07/12/2024 12:11   CT Angio Chest PE W/Cm &/Or Wo Cm Result Date: 07/11/2024 CLINICAL DATA:  High probability for PE. Chest pain with shortness of breath EXAM: CT ANGIOGRAPHY CHEST WITH CONTRAST TECHNIQUE: Multidetector CT imaging of the chest was performed using the standard protocol during bolus administration of intravenous contrast. Multiplanar CT image reconstructions and MIPs were obtained to evaluate the vascular anatomy. RADIATION DOSE REDUCTION: This exam was performed according to the departmental dose-optimization program which includes automated exposure control, adjustment of the mA and/or kV according to patient size and/or use of iterative reconstruction technique. CONTRAST:  75mL OMNIPAQUE  IOHEXOL  350 MG/ML SOLN COMPARISON:  CT angiogram chest 05/09/2024 FINDINGS: Cardiovascular: Satisfactory opacification of the pulmonary arteries to the segmental level. No evidence of pulmonary embolism. Patient is status post cardiac surgery. The heart is enlarged. There is no pericardial effusion. There are atherosclerotic calcifications of the aorta and coronary arteries. Aorta is normal in size. Mediastinum/Nodes: There are enlarged right hilar lymph nodes measuring up to 11 mm. Enlarged paratracheal lymph nodes measure up to 10 mm. Visualized thyroid gland and esophagus are within normal limits. Lungs/Pleura: Moderate emphysema again  seen. There is stable scarring in the right lung apex. There is new patchy slightly nodular airspace disease in the right upper lobe measuring 2.0 by 1.1 cm. There is right-sided central peribronchial wall thickening. Previously identified airspace disease in the left lower lobe has resolved. There is no pleural effusion or pneumothorax. There is a stable left lower lobe pulmonary nodule image 6/57.  Upper Abdomen: No acute abnormality. Musculoskeletal: No chest wall abnormality. No acute or significant osseous findings. Review of the MIP images confirms the above findings. IMPRESSION: 1. No evidence for pulmonary embolism. 2. New patchy slightly nodular airspace disease in the right upper lobe worrisome for pneumonia. Follow-up CT recommended in 4-6 weeks recommended to ensure resolution and to exclude underlying nodule. 3. Right-sided central peribronchial wall thickening worrisome for bronchitis. 4. Right hilar and mediastinal lymphadenopathy, likely reactive. 5. Stable left lower lobe pulmonary nodule. 6. Cardiomegaly. Aortic Atherosclerosis (ICD10-I70.0) and Emphysema (ICD10-J43.9). Electronically Signed   By: Greig Pique M.D.   On: 07/11/2024 18:54   DG Chest Portable 1 View Result Date: 07/11/2024 CLINICAL DATA:  SOB EXAM: PORTABLE CHEST - 1 VIEW COMPARISON:  June 24, 2024 FINDINGS: Biapical pleural thickening. Sternotomy wires and CABG markers. No focal airspace consolidation, pleural effusion, or pneumothorax. Mild cardiomegaly. Tortuous aorta with aortic atherosclerosis. No acute fracture or destructive lesions. Multilevel thoracic osteophytosis. IMPRESSION: No acute cardiopulmonary abnormality. Electronically Signed   By: Rogelia Myers M.D.   On: 07/11/2024 17:25   CARDIAC CATHETERIZATION Result Date: 07/02/2024   Prox LAD to Mid LAD lesion is 90% stenosed.   Ost LM to Mid LM lesion is 50% stenosed.   Ost Cx to Prox Cx lesion is 90% stenosed.   Ost RCA to Prox RCA lesion is 100% stenosed.   Mid  Graft to Dist Graft lesion is 95% stenosed.   Recommend uninterrupted dual antiplatelet therapy with Aspirin  81mg  daily and Clopidogrel  75mg  daily for a minimum of 6 months (stable ischemic heart disease-Class I recommendation). 1.  Severe native three-vessel CAD 2.  LIMA to LAD and SVG to OM widely patent 3.  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  Successful direct stenting of the distal SVG to PDA with distal protection with a 4.0 x 15 mm drug-eluting stent with intravascular ultrasound guidance 5.  Aspirin  and clopidogrel  for at least 6 months followed by clopidogrel  indefinitely 6.  Continue workup for aortic valve replacement    ECHO 04/2024: 1. Left ventricular ejection fraction, by estimation, is 60 to 65%. The left ventricle has normal function. The left ventricle has no regional wall motion abnormalities. The left ventricular internal cavity size was mildly dilated. Left ventricular diastolic parameters are consistent with Grade I diastolic dysfunction (impaired relaxation).   2. Right ventricular systolic function is normal. The right ventricular  size is normal.   3. The mitral valve is normal in structure. No evidence of mitral valve  regurgitation.   4. The aortic valve is normal in structure. Aortic valve regurgitation is  trivial. Severe aortic valve stenosis.   TELEMETRY reviewed by me 07/25/2024: sinus rhythm, rate 90s  EKG reviewed by me: sinus tachycardia ST depression, rate 129 bpm  Data reviewed by me 07/25/2024: last 24h vitals tele labs imaging I/O hospitalist progress note, PCCM notes  Principal Problem:   Acute respiratory distress Active Problems:   Hyperlipidemia   Cognitive impairment   Alcohol  abuse   Essential hypertension   CAD (coronary artery disease)   Stroke (HCC)   Iron  deficiency anemia   Acute on chronic diastolic CHF (congestive heart failure) (HCC)   COPD exacerbation (HCC)   Elevated lactic acid level   Lobar pneumonia (HCC)    Metabolic acidosis, increased anion gap   Myocardial injury    ASSESSMENT AND PLAN:  [name] is a 63 y.o. 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 Found to have pneumonia, overnight troponins uptrending. Cardiology was consulted for further evaluation.   # Ventricular Ectopy Per tele patient with bigeminy PVCs, started on IV amio on 07/30 for ventricular ectopy. PVC much improved s/p IV amio. Per tele this AM SR with rate 90s with no more ventricular ectopy -Continue to monitor telemetry closely.  -Monitor and replenish electrolytes for a goal K >4, Mag >2  -Continue Amio 400 mg BID for 10 days, then 200 mg daily.   # Acute hypoxic respiratory failure, mechanical ventilation # Aspiration pneumonia # Alcohol  withdrawal, improving # Acute on chronic HFpEF # Severe aortic stenosis Presented with SOB, cough, imaging concerning for HCAP/COPD exacerbation. LA elevated on presentation at 3.6 > 5.9> 3.4. s/p extubation 08/01. Increased O2 need to 10L, concern for aspiration event on 08/05. NGT removed (08/07). BNP elevated 1500 > 3200 (08/07).  -Continue IV lasix  40 mg BID.  Closely monitor renal function, UOP. -Augment diuresis and help decrease CO2 with IV acetazolamide  500 mg BID. Will likely transition to PO acetazolamide .   (Ordered BMP for 1500 today) -Will resume home dapagliflozin  10 mg daily tomorrow if alkalosis improves.  -Will plan to optimize GDMT as BP allows.  (Home meds: losartan , metoprolol  succinate, spironolactone ) -Further management of respiratory failure/ aspiration PNA as per primary team.  -CIWA protocol given hx of alcohol   abuse. -Scheduled to see Dr. Katina 7/30 to discuss aortic valve repair/replacement options. Will reschedule this appointment when patient stabilizes.    # Demand ischemia # Coronary artery disease s/p CABG x3 (04/2012) s/p recent stent (07/02/24) # Hypertension # Hyperlipidemia With recent LHC, s/p DES to SVG to PDA 07/02/2024. Reported some CP when he first came to hospital which has now resolved, no recurrence. Troponin trended 44 > 144 > 205 > 980 > 1862 > 5766 > 5756. Patient weaned off levo on 07/31, BP improving and stabilizing. Reports chest tightness with deep breathing, MSK tenderness on palpation, trops repeated (08/07), minimally elevated and flat 72 > 75 is demand ischemia, not ACS is setting of stated above hospital concerns. Repeat EKG (08/07) is without acute ischemic changes.   -Continue home plavix  75 mg daily. Continue aspirin  81 mg daily. Continue DAPT due to recent stent placement on 07/16. -Continue Crestor  20 mg daily for high-intensity statin management.  -Resume home metoprolol  succinate 12.5 mg daily.  -Will plan to resume home losartan , Imdur  as BP allows.  -Recommend MAP > 65.   # Acute on chronic anemia # Hx AVM Patient received blood transfusion on 07/28, 07/31, 08/04.  Hgb 8.0 > 8.6 >9.3 -Closely monitor H&H. -Continue PPIs. -Goal Hgb >8 due to cardiac history. Recommend transfusion when <8.  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 Continue DAPT due to recent stent placement.  -Management per primary team.    This patient's plan of care was discussed and created with Dr. Florencio and he is in agreement.  Signed: Dorene Comfort, PA-C  07/25/2024, 12:41 PM [facility] Cardiology

## 2024-07-25 NOTE — Progress Notes (Signed)
 Speech Language Pathology Treatment: Dysphagia  Patient Details Name: [name] MRN: [id number] DOB: October 22, 1961 Today's Date: 07/25/2024 Time: 0810-0830 SLP Time Calculation (min) (ACUTE ONLY): 20 min  Assessment / Plan / Recommendation Clinical Impression  Pt seen for ongoing assessment of PO readiness. Pt's nurse and RT initially present to place pt on HFNC. Pt with good respiratory stability when switched.   Pt presents with adequate oropharyngeal abilities when consuming ice chips, thin liquids via straw, nectar thick liquids via spoon and cup, puree and soft solid trials. Pt's RR and O2 remained throughout with no fatigue noted (including pt continuously talking throughout in addition to consuming POs - he was responsive to redirection to task). Additionally, he was free of any throat clears or coughing during consumption. At this time, recommend dysphagia 2 diet with thin liquids via straw, medicine whole with applesauce. Recommend FULL NURSING SUPERVISION with PO consumption and strict adherence to aspiration precautions. ST to follow for diet tolerance.      HPI HPI: per MD Progress Note, Saber Dickerman is a 63 y.o. male with medical history significant of  GIB, alcohol  abuse, COPD on 2 L oxygen, hypertension, CAD with prior CABG and recent Stent placement,, severe aortic stenosis, HLD, stroke, dementia, chronic diastolic CHF, who presented to the hospital on 07/11/2024 with SOB, cough productive of yellowish sputum, sided chest pain.     Patient was recently hospitalized from 7/16 - 7/17 for elective LHC. Pt was s/p of successful direct stenting of distal SVG to PDA.     CTA negative for PE, showed patchy infiltration in the right upper lobe.  Patient is admitted to telemetry bed as inpatient.         He was admitted to the hospitalist service for COPD exacerbation and acute hypoxic respiratory failure.  He developed worsening respiratory distress shortly after admission.  He was placed on  BiPAP and was transferred to the ICU for further management.  He has a became agitated and this was attributed to acute alcohol  withdrawal syndrome.  He was started on IV Precedex  infusion for this.  He developed acute toxic metabolic encephalopathy and worsening respiratory failure likely from aspiration pneumonia.  He required intubation and mechanical ventilation in the ICU.  He became hypotensive with induction meds despite IV fluids and IV Levophed  infusion.  There was brief loss of pulse but ROSC was achieved after 2 minutes of CPR and 1 dose of epinephrine . DG Chest 07/18/24: Stable nodular opacity within the periphery of the right upper lobe, similar  to CT from 07/11/2024. Please refer to that report for follow-up  recommendations.  2. Mild asymmetric hazy opacification over the right mid and right lower lung,  possibly related to rotational artifact versus small posterior layering  effusion.  3. Mild subsegmental atelectasis in the left base.      SLP Plan  Continue with current plan of care          Recommendations  Diet recommendations: Dysphagia 2 (fine chop);Thin liquid Liquids provided via: Straw Medication Administration: Whole meds with puree Supervision: Staff to assist with self feeding;Full supervision/cueing for compensatory strategies Compensations: Minimize environmental distractions;Slow rate;Small sips/bites Postural Changes and/or Swallow Maneuvers: Seated upright 90 degrees;Upright 30-60 min after meal;Out of bed for meals                  Oral care BID   Frequent or constant Supervision/Assistance Dysphagia, pharyngeal phase (R13.13)     Continue with current plan of care  Corey Caulfield B. Rubbie, M.S., CCC-SLP, Tree surgeon Certified Brain Injury Specialist [facility]  [facility] Rehabilitation Services Office [phone] Ascom [phone] Fax [phone]

## 2024-07-25 NOTE — Progress Notes (Signed)
 PT Cancellation Note  Patient Details Name: [name] MRN: [id number] DOB: 1961/07/28   Cancelled Treatment:     Hgb continues to trend down, currently at 7.7 and awaiting blood transfusion. Will re-attempt next available date/time per POC.    [name] 07/25/2024, 3:20 PM

## 2024-07-25 NOTE — Progress Notes (Signed)
 Progress Note    [name]  FMW:[id number] DOB: 05-06-1961  DOA: 07/11/2024 PCP: Osa Geralds, NP      Brief Narrative:    Medical records reviewed and are as summarized below:  [name] is a 63 y.o. male with medical history significant of  GIB, alcohol  abuse, COPD on 2 L oxygen, hypertension, CAD with prior CABG and recent Stent placement,, severe aortic stenosis, HLD, stroke, dementia, chronic diastolic CHF, who presented to the hospital on 07/11/2024 with SOB, cough productive of yellowish sputum, sided chest pain.   Patient was recently hospitalized from 7/16 - 7/17 for elective LHC. Pt was s/p of successful direct stenting of distal SVG to PDA.  CTA negative for PE, showed patchy infiltration in the right upper lobe.  Patient is admitted to telemetry bed as inpatient.    He was admitted to the hospitalist service for COPD exacerbation and acute hypoxic respiratory failure.  He developed worsening respiratory distress shortly after admission.  He was placed on BiPAP and was transferred to the ICU for further management.  He has a became agitated and this was attributed to acute alcohol  withdrawal syndrome.  He was started on IV Precedex  infusion for this. He developed acute toxic metabolic encephalopathy and worsening respiratory failure likely from aspiration pneumonia.  He required intubation and mechanical ventilation in the ICU.  He became hypotensive with induction meds despite IV fluids and IV Levophed  infusion.  There was brief loss of pulse but ROSC was achieved after 2 minutes of CPR and 1 dose of epinephrine .    Significant Hospital Events: Including procedures, antibiotic start and stop dates in addition to other pertinent events   7/25: admit with respiratory failure 7/26: clinically decompensated, worsening respiratory status initiated on BiPAP 7/27: on nasal cannula, reports anxiety and tremors consistent with EtOH withdrawal.  Received a total of 15  mg/kg of phenobarbital , however he remained agitated requiring low dose precedex  gtt.  7/28: Pt resting in bed no agitation on precedex  gtt @0 .5 mcg/min.  Orders placed to transfuse 1 unit of pRBC's hgb 7.2 goal hgb 8.0.  No signs of active bleeding  7/29: Pt remains encephalopathic with severe DT's requiring precedex  gtt.  NGT placed to initiate tube feeds and administer medications  7/30: Earlier this morning with vomiting, concern for aspiration event as now with increased WOB and increasing FiO2 requirements (up to 4L). Remains encephalopathic on Precedex .  Respiratory status continued to decline, progressed to agonal respirations, Required INTUBATION for airway protection.  Hypotensive with induction meds despite IV fluids and Levophed  resulting in brief loss of pulse (ROSC obtained after 2 minutes of CPR and 1 epi).  Central line placed due to vasopressors. 7/31: Overnight with episode on non-sustained SVT which converted spontaneously, remains on Amiodarone  gtt per Cardiology.  Nursing reported black tarry stools overnight, Hgb remains stable.   Levophed  weaned off, on minimal vent settings, will perform WUA and SBT as mental status allows.  With fever of 101 F overnight and WBC nearly doubled, tracheal aspirate results pending, will check blood cultures and UA, broaden to empiric Vancomycin  and Zosyn  pending culture results.   08/01:  No significant events reported overnight. Received 1 unit pRBC's overnight for Hgb 7.5 with appropriate response to 8.8 and has since remained stable with no reports of bleeding from nursing.  Fevers resolved, WBC improved with broadening to Zosyn  yesterday, cultures currently with no growth.  Weaned off levophed .  On minimal vent support, passed WUA and SBT,  EXTUBATED TO HHFNC.  Diuresed with 20 mg IV Lasix  x1 dose with extubation, needs aggressive pulmonary toilet. 8/6: Patient with worsening respiratory status and oxygen requirement this morning, appears more  congested.  Apparently concern of aspiration event yesterday evening when his NG tube was dislodged.  Ordered repeat chest x-ray and sputum culture, worsening leukocytosis and procalcitonin at 0.26.  Starting on cefepime , Mucomyst  and giving 1 dose of IV Lasix .  Currently on 10 L of oxygen 8/7: Worsening respiratory status and increased work of breathing overnight, patient was transferred back to stepdown and started on heated high flow at 70% FiO2. Tube feed was held.  Chest x-ray with unchanged interstitial opacities bilaterally, possibly persistent interstitial edema and underlying emphysematous changes.  Also shows similar appearing lateral right upper lobe zone nodular opacity.  Patient was started on IV diuresis along with extensive pulmonary toileting.  BNP increased to 3252, troponin at 72 with flat curve, MRSA PCR negative PCCM was reconsulted as patient is high risk for reintubation. Palliative care was also consulted. 8/8: Clinically improving as of improving oxygen requirement, sputum cultures with normal respiratory flora. Saw alert team started on dysphagia 2 diet with thin liquid.  Hemoglobin decreased to 7.7 so ordered another unit of PRBC as goal hemoglobin is above 8 due to recent NSTEMI.  Continuing IV diuresis   Assessment/Plan:   Principal Problem:   Acute respiratory distress Active Problems:   COPD exacerbation (HCC)   Lobar pneumonia (HCC)   Elevated lactic acid level   Metabolic acidosis, increased anion gap   Essential hypertension   Acute on chronic diastolic CHF (congestive heart failure) (HCC)   Hyperlipidemia   Stroke (HCC)   CAD (coronary artery disease)   Iron  deficiency anemia   Alcohol  abuse   Cognitive impairment   Myocardial injury   Nutrition Problem: Inadequate oral intake Etiology: lethargy/confusion  Signs/Symptoms: NPO status   Body mass index is 30.44 kg/m.  (Obesity)   Acute on chronic hypoxic respiratory failure: Worsening respiratory  status and oxygen requirement requiring transferring back to stepdown and he was placed on heated high flow with FiO2 of 70%-started improving and now on 30 L PCCM was again consulted Started on IV diuresis-continue with IV diuresis now - Continue supplemental oxygen-wean as tolerated S/p extubation on 07/18/2024.  Was intubated on 07/16/2024. Patient is currently high risk for reintubation.  Aspiration pneumonia: Another concern of aspiration yesterday evening with resultant worsening respiratory status. Worsening leukocytosis again.  PCT at 0.26  Completed IV Zosyn  on 07/21/2024. Suspected septic shock: Resolved.  He is off of IV Levophed  infusion.  Repeat chest x-ray with persistent opacities Improving leukocytosis today -Continue with cefepime  -Sputum culture-normal respiratory flora-MRSA PCR was negative - Mucomyst  nebulizer due to increased congestion and respiratory secretions. - Aggressive pulmonary toilet - Continue with supportive care  COPD exacerbation: Continue bronchodilators.  Completed steroids.  Acute toxic metabolic encephalopathy: Mental status is improving.  Continue supportive care Alcohol  use disorder with alcohol  withdrawal syndrome: He is off of IV Precedex  infusion.  Continue IV thiamine .  Brief cardiac arrest due to severe hypotension from induction meds on 07/16/2024 (ROSC achieved after 2 minutes of CPR and 1 dose of epinephrine ).  Acute NSTEMI, elevated troponins (peaked at 5,766), CAD, recent elective left heart cath s/p DES to SVG to PDA 07/02/2024,  history of CABG x 3 (May 2013): Continue aspirin , Plavix  and rosuvastatin .  Acute on chronic diastolic CHF, severe aortic stenosis:  2D echo on 05/10/2024 showed EF estimated at 60  to 65%, grade 1 diastolic dysfunction, severe aortic valve stenosis. Repeat BNP shows worsening and now at 3252 - Started on twice daily IV diuresis-continue - Daily weight and BNP - Strict intake and output  Chronic anemia, history of  GI bleed: S/p transfusion with 3 unit of PRBCs on 07/21/2024, 7/28 and 07/17/2024 Hemoglobin at 7.7 today-ordered 1 more unit of PRBC No evidence of overt GI bleeding thus far.   Continue IV Protonix .  He was evaluated by gastroenterologist on 07/13/2024.  GI signed off. 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  Continue enteral nutrition.  Monitor BMP.  AKI: Resolved  Hyperglycemia: Glucose levels are stable.  Hemoglobin A1c was 5.3 on 10/28/2022   Comorbidities include hypertension, hyperlipidemia, history of stroke   Diet Order             DIET DYS 2 Room service appropriate? Yes with Assist; Fluid consistency: Thin  Diet effective now                  Consultants: Physiological scientist  Procedures: Intubated on 07/16/2024 and extubated on 07/18/2024 Left IJ central line placed on 07/16/2024  Medications:    sodium chloride    Intravenous Once   acetaZOLAMIDE   500 mg Intravenous BID   amiodarone   400 mg Oral BID   Followed by   NOREEN ON 07/28/2024] amiodarone   200 mg Oral Daily   ascorbic acid   500 mg Oral Daily   aspirin   81 mg Oral Daily   budesonide  (PULMICORT ) nebulizer solution  0.5 mg Nebulization BID   Chlorhexidine  Gluconate Cloth  6 each Topical Q0600   clopidogrel   75 mg Oral Daily   enoxaparin  (LOVENOX ) injection  40 mg Subcutaneous QHS   feeding supplement  237 mL Oral TID BM   folic acid   1 mg Oral Daily   furosemide   40 mg Intravenous BID   ipratropium-albuterol   3 mL Nebulization Q4H   iron  polysaccharides  150 mg Oral Daily   methylPREDNISolone  (SOLU-MEDROL ) injection  20 mg Intravenous BID   metoprolol  succinate  12.5 mg Oral Daily   multivitamin with minerals  1 tablet Oral Daily   mouth  rinse  15 mL Mouth Rinse 4 times per day   pantoprazole  (PROTONIX ) IV  40 mg Intravenous Q12H   polyethylene glycol  17 g Oral Daily   rosuvastatin   20 mg Oral Daily   thiamine   100 mg Oral Daily   [START ON 07/26/2024] Vitamin D  (Ergocalciferol )  50,000 Units Oral Q7 days   Continuous Infusions:     Anti-infectives (From admission, onward)    Start     Dose/Rate Route Frequency Ordered Stop   07/23/24 1400  ceFEPIme  (MAXIPIME ) 2 g in sodium chloride  0.9 % 100 mL IVPB  Status:  Discontinued        2 g 200 mL/hr over 30 Minutes Intravenous Every 8 hours 07/23/24 1203 07/24/24 1028   07/17/24 1000  piperacillin -tazobactam (ZOSYN ) IVPB 3.375 g  Status:  Discontinued        3.375 g 12.5 mL/hr over 240 Minutes Intravenous Every 8 hours 07/17/24 0811 07/21/24 1100   07/17/24 1000  vancomycin  (VANCOREADY) IVPB 2000 mg/400 mL  Status:  Discontinued        2,000 mg 200 mL/hr over 120  Minutes Intravenous  Once 07/17/24 0811 07/17/24 1026   07/14/24 1200  cefTRIAXone  (ROCEPHIN ) 2 g in sodium chloride  0.9 % 100 mL IVPB        2 g 200 mL/hr over 30 Minutes Intravenous Every 24 hours 07/14/24 1030 07/16/24 1204   07/12/24 1600  piperacillin -tazobactam (ZOSYN ) IVPB 3.375 g  Status:  Discontinued        3.375 g 12.5 mL/hr over 240 Minutes Intravenous Every 8 hours 07/12/24 1503 07/14/24 1030   07/12/24 0900  vancomycin  (VANCOREADY) IVPB 1250 mg/250 mL  Status:  Discontinued        1,250 mg 166.7 mL/hr over 90 Minutes Intravenous Every 12 hours 07/11/24 2012 07/12/24 1458   07/11/24 2100  ceFEPIme  (MAXIPIME ) 2 g in sodium chloride  0.9 % 100 mL IVPB  Status:  Discontinued        2 g 200 mL/hr over 30 Minutes Intravenous Every 8 hours 07/11/24 1959 07/12/24 1458   07/11/24 2100  vancomycin  (VANCOREADY) IVPB 2000 mg/400 mL        2,000 mg 200 mL/hr over 120 Minutes Intravenous  Once 07/11/24 2004 07/11/24 2255   07/11/24 1945  cefTRIAXone  (ROCEPHIN ) 1 g in sodium chloride  0.9 % 100 mL IVPB  Status:   Discontinued        1 g 200 mL/hr over 30 Minutes Intravenous  Once 07/11/24 1930 07/11/24 1933   07/11/24 1945  azithromycin  (ZITHROMAX ) 500 mg in sodium chloride  0.9 % 250 mL IVPB  Status:  Discontinued        500 mg 250 mL/hr over 60 Minutes Intravenous  Once 07/11/24 1930 07/11/24 1955   07/11/24 1945  cefTRIAXone  (ROCEPHIN ) 2 g in sodium chloride  0.9 % 100 mL IVPB  Status:  Discontinued        2 g 200 mL/hr over 30 Minutes Intravenous  Once 07/11/24 1933 07/11/24 1955       Family Communication/Anticipated D/C date and plan/Code Status    DVT prophylaxis: Lovenox      Code Status: Full Code  Family Communication: Discussed with patient  Disposition Plan: Plan to discharge to SNF   Status is: Inpatient Remains inpatient appropriate because: Acute respiratory failure, aspiration pneumonia   Subjective:  Patient was seen and examined today.  Feeling little improved and able to eat now.  Objective:    Vitals:   07/25/24 1116 07/25/24 1120 07/25/24 1200 07/25/24 1300  BP:   130/70 121/74  Pulse:   91 93  Resp:   16 15  Temp:      TempSrc:      SpO2: 100% 98% 100% 100%  Weight:      Height:       No data found.   Intake/Output Summary (Last 24 hours) at 07/25/2024 1405 Last data filed at 07/25/2024 1402 Gross per 24 hour  Intake 1642 ml  Output 2550 ml  Net -908 ml   Filed Weights   07/23/24 0500 07/24/24 0500 07/25/24 0500  Weight: 98.5 kg 101.8 kg 101.8 kg    Exam: General.  Ill-appearing gentleman, in no acute distress. Pulmonary.  Lungs clear bilaterally, normal respiratory effort. CV.  Regular rate and rhythm, no JVD, rub or murmur. Abdomen.  Soft, nontender, nondistended, BS positive. CNS.  Alert and oriented .  No focal neurologic deficit. Extremities.  No edema,  pulses intact and symmetrical. Psychiatry.  Judgment and insight appears normal.     Data Reviewed:   I have personally reviewed following labs and imaging studies:  Labs: Labs  show the following:   Basic Metabolic Panel: Recent Labs  Lab 07/19/24 0559 07/20/24 0531 07/21/24 0455 07/22/24 0416 07/23/24 0457 07/24/24 0406 07/25/24 0439 07/25/24 1334  NA 145 147* 145 146* 142 141 144 140  K 3.7 4.4 4.0 3.9 4.1 4.3 4.8 4.2  CL 108 109 105 104 101 99 99 95*  CO2 32 29 33* 33* 32 33* 34* 31  GLUCOSE 167* 178* 193* 186* 138* 160* 125* 117*  BUN 23 27* 33* 36* 37* 48* 54* 55*  CREATININE 0.85 1.01 1.15 0.97 1.12 1.05 1.15 1.20  CALCIUM  8.0* 8.6* 8.4* 8.6* 8.6* 8.7* 8.5* 8.5*  MG 2.4  --   --   --  2.7*  --  2.9*  --   PHOS 3.5 3.1 3.4  --  4.6 4.9* 5.0*  --    GFR Estimated Creatinine Clearance: 75.8 mL/min (by C-G formula based on SCr of 1.2 mg/dL). Liver Function Tests: Recent Labs  Lab 07/19/24 0559 07/20/24 0531 07/21/24 0455 07/24/24 0406  ALBUMIN 2.6* 3.0* 2.7* 2.9*   No results for input(s): LIPASE, AMYLASE in the last 168 hours. No results for input(s): AMMONIA in the last 168 hours.  Coagulation profile No results for input(s): INR, PROTIME in the last 168 hours.  CBC: Recent Labs  Lab 07/21/24 0455 07/22/24 0416 07/23/24 0457 07/24/24 0406 07/25/24 0439  WBC 10.5 14.0* 18.8* 15.7* 10.5  HGB 8.0* 8.6* 9.3* 8.1* 7.7*  HCT 26.9* 28.3* 31.0* 27.5* 26.2*  MCV 92.4 91.6 92.5 93.9 95.6  PLT 211 247 247 246 231   Cardiac Enzymes: No results for input(s): CKTOTAL, CKMB, CKMBINDEX, TROPONINI in the last 168 hours. BNP (last 3 results) No results for input(s): PROBNP in the last 8760 hours. CBG: Recent Labs  Lab 07/24/24 1625 07/24/24 2318 07/25/24 0319 07/25/24 0813 07/25/24 1149  GLUCAP 130* 98 121* 102* 127*   D-Dimer: No results for input(s): DDIMER in the last 72 hours. Hgb A1c: No results for input(s): HGBA1C in the last 72 hours. Lipid Profile: No results for input(s): CHOL, HDL, LDLCALC, TRIG, CHOLHDL, LDLDIRECT in the last 72 hours.  Thyroid function studies: No results for  input(s): TSH, T4TOTAL, T3FREE, THYROIDAB in the last 72 hours.  Invalid input(s): FREET3 Anemia work up: No results for input(s): VITAMINB12, FOLATE, FERRITIN, TIBC, IRON , RETICCTPCT in the last 72 hours. Sepsis Labs: Recent Labs  Lab 07/22/24 0416 07/23/24 0457 07/24/24 0406 07/25/24 0439  PROCALCITON  --  0.26  --   --   WBC 14.0* 18.8* 15.7* 10.5    Microbiology Recent Results (from the past 240 hours)  Culture, Respiratory w Gram Stain     Status: None   Collection Time: 07/16/24  4:02 PM   Specimen: Tracheal Aspirate; Respiratory  Result Value Ref Range Status   Specimen Description   Final    TRACHEAL ASPIRATE Performed at [facility], [address] [place], [place] [address]    Special Requests   Final    NONE Performed at [facility], [address] [place], [place] [address]    Gram Stain   Final    RARE WBC PRESENT, PREDOMINANTLY PMN NO ORGANISMS SEEN    Culture   Final    NO GROWTH 2 DAYS Performed at [facility], [address], [place], [place] [address]    Report Status 07/19/2024 FINAL  Final  MRSA Next Gen by PCR, Nasal     Status: None   Collection  Time: 07/17/24  8:26 AM   Specimen: Nasal Mucosa; Nasal Swab  Result Value Ref Range Status   MRSA by PCR Next Gen NOT DETECTED NOT DETECTED Final    Comment: (NOTE) The GeneXpert MRSA Assay (FDA approved for NASAL specimens only), is one component of a comprehensive MRSA colonization surveillance program. It is not intended to diagnose MRSA infection nor to guide or monitor treatment for MRSA infections. Test performance is not FDA approved in patients less than 43 years old. Performed at [facility], [address] [place], [place] [address]   Culture, blood (Routine X 2) w Reflex to ID Panel     Status: None   Collection Time: 07/17/24  9:57 AM   Specimen: BLOOD  Result Value Ref Range Status   Specimen Description BLOOD BLOOD  LEFT HAND  Final   Special Requests   Final    BOTTLES DRAWN AEROBIC AND ANAEROBIC Blood Culture adequate volume   Culture   Final    NO GROWTH 5 DAYS Performed at [facility], [address] [place], [place] [address]    Report Status 07/22/2024 FINAL  Final  Culture, blood (Routine X 2) w Reflex to ID Panel     Status: None   Collection Time: 07/17/24  9:57 AM   Specimen: BLOOD  Result Value Ref Range Status   Specimen Description BLOOD BLOOD LEFT WRIST  Final   Special Requests   Final    BOTTLES DRAWN AEROBIC AND ANAEROBIC Blood Culture adequate volume   Culture   Final    NO GROWTH 5 DAYS Performed at [facility], [address] [place], [place] [address]    Report Status 07/22/2024 FINAL  Final  Culture, Respiratory w Gram Stain     Status: None   Collection Time: 07/23/24 11:32 AM   Specimen: Sputum  Result Value Ref Range Status   Specimen Description   Final    SPU Performed at [facility], [address], [place], [place] [address]    Special Requests   Final    NONE Performed at [facility], [address] [place], [place] [address]    Gram Stain   Final    RARE WBC PRESENT, PREDOMINANTLY PMN RARE GRAM POSITIVE COCCI    Culture   Final    Normal respiratory flora-no Staph aureus or Pseudomonas seen Performed at [facility], [address], [place], [place] [address]    Report Status 07/25/2024 FINAL  Final  MRSA Next Gen by PCR, Nasal     Status: None   Collection Time: 07/24/24 11:08 AM   Specimen: Nasal Mucosa; Nasal Swab  Result Value Ref Range Status   MRSA by PCR Next Gen NOT DETECTED NOT DETECTED Final    Comment: (NOTE) The GeneXpert MRSA Assay (FDA approved for NASAL specimens only), is one component of a comprehensive MRSA colonization surveillance program. It is not intended to diagnose MRSA infection nor to guide or monitor treatment for MRSA infections. Test performance is not FDA approved  in patients less than 80 years old. Performed at [facility], [address] [place], [place] [address]     Procedures and diagnostic studies:  DG Chest 1 View Result Date: 07/24/2024 CLINICAL DATA:  [id number] Dyspnea [id number] EXAM: CHEST  1 VIEW COMPARISON:  July 23, 2024, July 11, 2024 FINDINGS: Esophagogastric tube terminates below the diaphragm, outside the field of view. Diffuse interstitial opacities throughout both lungs. No lobar consolidation  or pneumothorax. Lateral right upper lobe nodular opacity measures 1.9 cm, corresponding to the lung nodule on the prior chest CT. Moderate cardiomegaly. Sternotomy wires and CABG markers. No acute fracture or destructive lesions. Multilevel thoracic osteophytosis. IMPRESSION: 1. Moderate cardiomegaly. Unchanged interstitial opacities throughout both lungs, possibly persistent interstitial edema and underlying emphysematous changes. 2. Similarly appearing lateral right upper lung zone nodular opacity. Please see the comparison chest CT for further characterization. Electronically Signed   By: Rogelia Myers M.D.   On: 07/24/2024 08:43     LOS: 14 days   Amaryllis Dare MD  [facility]   Pager on [email or web address]. If 7PM-7AM, please contact night-coverage at [email or web address]  07/25/2024, 2:05 PM

## 2024-07-25 NOTE — Consult Note (Signed)
 Consultation Note Date: 07/25/2024   Patient Name: [name]  DOB: April 02, 1961  MRN: [id number]  Age / Sex: 63 y.o., male  PCP: Osa Geralds, NP Referring Physician: Caleen Qualia, MD  Reason for Consultation: Establishing goals of care   HPI/Brief Hospital Course: 63 y.o. male  with past medical history of GIB, ETOH abuse, COPD with chronic respiratory failure on 2L [place] at baseline, CAD s/p CABG and recent stent placement s/p LHC (07/02/2024), severe aortic stenosis, CVA, dementia, CHF and HLD admitted from home on 07/11/2024 with shortness of breath and productive cough.  Admitted for COPD exacerbation and acute hypoxic respiratory failure, admission complicated by ETOH withdrawal requiring phenobarbital  and precedex , suffered aspiration event 7/30--led to brief cardiac arrest (x2 minutes prior to ROSC) and required intubation-extubated 8/1--8/6 required HHFNC, has since been weaned to [facility]  Palliative medicine was consulted for assisting with goals of care conversations.  Subjective:  Extensive chart review has been completed prior to meeting patient including labs, vital signs, imaging, progress notes, orders, and available advanced directive documents from current and previous encounters.  Visited with [name] at his bedside. He is awake, alert and able to engage in conversation. He is able to correctly state his name, DOB and aware he is in the hospital. He is unclear on events that led to admission and events that have occurred since admission. No family or visitors at bedside during time of visit.  Introduced myself as a Publishing rights manager as a member of the palliative care team. Explained palliative medicine is specialized medical care for people living with serious illness. It focuses on providing relief from the symptoms and stress of a serious illness. The goal is to improve quality of life for both the patient and the family.   [name] shares  he lives at home alone, he is remains social and spends his days eating out at different restaurants. He has a close friend-Terry [place] who lives next door, Terril has a wife-[name] who also checks in on him frequently. He shares he is divorced x2, has recently started having contact with one of his ex-wives (also named [name]?). It remains unclear if he has biological children.  [name] shares that he has a Veterinary surgeon established with [employer]? He visits his counselor once a week to assist with coordinating and helping him remember his medical appointments.  He speaks of his ETOH intake and shares he has been consuming ETOH for many years. He recently increased his intake and shares he consumes between 3-6 large beers daily.  We discussed patient's current illness and what it means in the larger context of patient's on-going co-morbidities. Natural disease trajectory and expectations at EOL were discussed.   Attempted to discuss goals of care. We discussed Full Code and Do Not Resuscitate. Encouraged [name] to consider DNR/DNI status understanding evidenced based poor outcomes in similar hospitalized patients, as the cause of the arrest is likely associated with chronic/terminal disease rather than a reversible acute cardio-pulmonary event.  We discussed his brief cardiac arrest and need for mechanical ventilation. He shares at this time he wishes to remain Full Code but will consider goals of care further.  Attempted to discuss establishment of surrogate decision maker-he is unsure at this time on who he wishes to establish as Management consultant. He mentions a son-James and daughter-Kristen, unclear on recent communication or contact with them.  I discussed importance of continued conversations with family/support persons and all members of their medical team regarding  overall plan of care and treatment options ensuring decisions are in alignment with patients goals of care.  All  questions/concerns addressed. Emotional support provided to patient/family/support persons. PMT will continue to follow and support patient as needed.  Objective: Primary Diagnoses: Present on Admission:  COPD exacerbation (HCC)  Iron  deficiency anemia  CAD (coronary artery disease)  Stroke [facility])  Essential hypertension  Hyperlipidemia  Cognitive impairment  Alcohol  abuse  Elevated lactic acid level  Acute on chronic diastolic CHF (congestive heart failure) (HCC)   Physical Exam Constitutional:      General: He is not in acute distress.    Appearance: He is ill-appearing.  Pulmonary:     Effort: Pulmonary effort is normal. No respiratory distress.  Skin:    General: Skin is warm and dry.  Neurological:     Mental Status: He is alert.     Motor: Weakness present.     Comments: Oriented x2, intermittent confusion remains     Vital Signs: BP 126/67   Pulse 90   Temp 98.1 F (36.7 C)   Resp 19   Ht 6' (1.829 m)   Wt 101.8 kg   SpO2 100%   BMI 30.44 kg/m  Pain Scale: 0-10   Pain Score: 0-No pain  IO: Intake/output summary:  Intake/Output Summary (Last 24 hours) at 07/25/2024 1704 Last data filed at 07/25/2024 1617 Gross per 24 hour  Intake 956 ml  Output 3000 ml  Net -2044 ml    LBM: Last BM Date : 07/22/24 Baseline Weight: Weight: 95 kg Most recent weight: Weight: 101.8 kg      Assessment and Plan  SUMMARY OF RECOMMENDATIONS   Continue with current plan of care Establishment of NOK PMT to continue to follow for ongoing needs and support  Palliative Prophylaxis:   Bowel Regimen, Delirium Protocol and Frequent Pain Assessment  Discussed With: Nursing staff   Thank you for this consult and allowing Palliative Medicine to participate in the care of [facility]. Palliative medicine will continue to follow and assist as needed.   Time Total: 75 minutes  Time spent includes: Detailed review of medical records (labs, imaging, vital signs), medically  appropriate exam (mental status, respiratory, cardiac, skin), discussed with treatment team, counseling and educating patient, family and staff, documenting clinical information, medication management and coordination of care.   Signed by: Waddell Lesches, DNP, AGNP-C Palliative Medicine    Please contact Palliative Medicine Team phone at [phone] for questions and concerns.  For individual provider: See Tracey

## 2024-07-26 DIAGNOSIS — R0603 Acute respiratory distress: Secondary | ICD-10-CM | POA: Diagnosis not present

## 2024-07-26 DIAGNOSIS — J181 Lobar pneumonia, unspecified organism: Secondary | ICD-10-CM | POA: Diagnosis not present

## 2024-07-26 DIAGNOSIS — J441 Chronic obstructive pulmonary disease with (acute) exacerbation: Secondary | ICD-10-CM | POA: Diagnosis not present

## 2024-07-26 DIAGNOSIS — Z515 Encounter for palliative care: Secondary | ICD-10-CM | POA: Diagnosis not present

## 2024-07-26 DIAGNOSIS — J189 Pneumonia, unspecified organism: Secondary | ICD-10-CM | POA: Diagnosis not present

## 2024-07-26 DIAGNOSIS — R7989 Other specified abnormal findings of blood chemistry: Secondary | ICD-10-CM | POA: Diagnosis not present

## 2024-07-26 LAB — GLUCOSE, CAPILLARY
Glucose-Capillary: 128 mg/dL — ABNORMAL HIGH (ref 70–99)
Glucose-Capillary: 158 mg/dL — ABNORMAL HIGH (ref 70–99)

## 2024-07-26 LAB — BASIC METABOLIC PANEL WITH GFR
Anion gap: 11 (ref 5–15)
Anion gap: 10 (ref 5–15)
BUN: 53 mg/dL — ABNORMAL HIGH (ref 8–23)
BUN: 49 mg/dL — ABNORMAL HIGH (ref 8–23)
CO2: 32 mmol/L (ref 22–32)
CO2: 30 mmol/L (ref 22–32)
Calcium: 8.6 mg/dL — ABNORMAL LOW (ref 8.9–10.3)
Calcium: 8.5 mg/dL — ABNORMAL LOW (ref 8.9–10.3)
Chloride: 94 mmol/L — ABNORMAL LOW (ref 98–111)
Chloride: 95 mmol/L — ABNORMAL LOW (ref 98–111)
Creatinine, Ser: 1.38 mg/dL — ABNORMAL HIGH (ref 0.61–1.24)
Creatinine, Ser: 1.27 mg/dL — ABNORMAL HIGH (ref 0.61–1.24)
GFR, Estimated: 57 mL/min — ABNORMAL LOW (ref >60)
GFR, Estimated: >60 mL/min (ref >60)
Glucose, Bld: 170 mg/dL — ABNORMAL HIGH (ref 70–99)
Glucose, Bld: 166 mg/dL — ABNORMAL HIGH (ref 70–99)
Potassium: 3.7 mmol/L (ref 3.5–5.1)
Potassium: 3.6 mmol/L (ref 3.5–5.1)
Sodium: 137 mmol/L (ref 135–145)
Sodium: 135 mmol/L (ref 135–145)

## 2024-07-26 LAB — TYPE AND SCREEN
ABO/RH(D): A POS
Antibody Screen: NEGATIVE
Unit division: 0

## 2024-07-26 LAB — BPAM RBC
Blood Product Expiration Date: 202509062359
ISSUE DATE / TIME: 202508081511
Unit Type and Rh: 6200

## 2024-07-26 LAB — CBC
HCT: 28.3 % — ABNORMAL LOW (ref 39.0–52.0)
Hemoglobin: 8.7 g/dL — ABNORMAL LOW (ref 13.0–17.0)
MCH: 28.7 pg (ref 26.0–34.0)
MCHC: 30.7 g/dL (ref 30.0–36.0)
MCV: 93.4 fL (ref 80.0–100.0)
Platelets: 274 K/uL (ref 150–400)
RBC: 3.03 MIL/uL — ABNORMAL LOW (ref 4.22–5.81)
RDW: 18.5 % — ABNORMAL HIGH (ref 11.5–15.5)
WBC: 16.1 K/uL — ABNORMAL HIGH (ref 4.0–10.5)
nRBC: 0 % (ref 0.0–0.2)

## 2024-07-26 LAB — MAGNESIUM: Magnesium: 2.4 mg/dL (ref 1.7–2.4)

## 2024-07-26 LAB — PHOSPHORUS: Phosphorus: 4 mg/dL (ref 2.5–4.6)

## 2024-07-26 MED ORDER — METHYLPREDNISOLONE SODIUM SUCC 40 MG IJ SOLR
20.0000 mg | Freq: Every day | INTRAMUSCULAR | Status: DC
  Administered 2024-07-27: 20 mg via INTRAVENOUS
  Filled 2024-07-26: qty 1

## 2024-07-26 MED ORDER — FAMOTIDINE 20 MG PO TABS
20.0000 mg | ORAL_TABLET | Freq: Once | ORAL | Status: AC
  Administered 2024-07-26: 20 mg via ORAL
  Filled 2024-07-26: qty 1

## 2024-07-26 MED ORDER — SENNOSIDES-DOCUSATE SODIUM 8.6-50 MG PO TABS
1.0000 | ORAL_TABLET | Freq: Once | ORAL | Status: AC
  Administered 2024-07-26: 1 via ORAL
  Filled 2024-07-26: qty 1

## 2024-07-26 NOTE — Progress Notes (Signed)
 Occupational Therapy Treatment Patient Details Name: [name] MRN: [id number] DOB: January 17, 1961 Today's Date: 07/26/2024   History of present illness 63 year old male patient admitted to the ICU with severe alcohol  withdrawal Dts requiring multiple doses of Phenobarb and eventually precedex  drip. Unfortunately course complicated by an aspriation event requiring intubation and mechanical ventilation with course c/b peri intubation brief cardiac arrest requiring one round of CPR.  Pt ultimately needing to be extubated ~24 hours.   OT comments  Upon entering the room, pt supine in bed and agreeable to OT intervention. Pt needing total A to don B socks. Min A for bed mobility. Pt standing with min A and taking several steps to recliner chair on the R. Pt seated with call bell and all needed items within reach. Pt reports fatigue. RN notified pt seated in recliner chair.       If plan is discharge home, recommend the following:  A lot of help with walking and/or transfers;A lot of help with bathing/dressing/bathroom;Assistance with cooking/housework;Direct supervision/assist for medications management;Assist for transportation   Equipment Recommendations  Other (comment) (defer to next venue of care)       Precautions / Restrictions Precautions Precautions: Fall Recall of Precautions/Restrictions: Intact       Mobility Bed Mobility Overal bed mobility: Needs Assistance Bed Mobility: Supine to Sit     Supine to sit: Min assist          Transfers Overall transfer level: Needs assistance Equipment used: Rolling walker (2 wheels) Transfers: Sit to/from Stand, Bed to chair/wheelchair/BSC Sit to Stand: Min assist     Step pivot transfers: Min assist           Balance Overall balance assessment: Needs assistance Sitting-balance support: Feet supported Sitting balance-Leahy Scale: Good     Standing balance support: During functional activity, Bilateral upper extremity  supported Standing balance-Leahy Scale: Fair                             ADL either performed or assessed with clinical judgement   ADL Overall ADL's : Needs assistance/impaired                     Lower Body Dressing: Bed level;Total assistance Lower Body Dressing Details (indicate cue type and reason): to don B socks                    Extremity/Trunk Assessment Upper Extremity Assessment Upper Extremity Assessment: Generalized weakness            Vision Patient Visual Report: No change from baseline           Communication Communication Communication: Impaired Factors Affecting Communication: Hearing impaired   Cognition Arousal: Alert Behavior During Therapy: WFL for tasks assessed/performed Cognition: Cognition impaired     Awareness: Intellectual awareness impaired, Online awareness impaired Memory impairment (select all impairments): Short-term memory Attention impairment (select first level of impairment): Sustained attention Executive functioning impairment (select all impairments): Reasoning, Problem solving, Sequencing OT - Cognition Comments: oriented to self and location. Slow processing                 Following commands: Intact Following commands impaired: Follows one step commands with increased time      Cueing   Cueing Techniques: Verbal cues, Gestural cues, Tactile cues  Exercises              Pertinent Vitals/ Pain  Pain Assessment Pain Assessment: No/denies pain         Frequency  Min 2X/week        Progress Toward Goals  OT Goals(current goals can now be found in the care plan section)  Progress towards OT goals: Progressing toward goals      AM-PAC OT 6 Clicks Daily Activity     Outcome Measure   Help from another person eating meals?: None Help from another person taking care of personal grooming?: A Little Help from another person toileting, which includes using toliet,  bedpan, or urinal?: A Lot Help from another person bathing (including washing, rinsing, drying)?: A Lot Help from another person to put on and taking off regular upper body clothing?: A Little Help from another person to put on and taking off regular lower body clothing?: A Lot 6 Click Score: 16    End of Session Equipment Utilized During Treatment: Oxygen  OT Visit Diagnosis: Muscle weakness (generalized) (M62.81);History of falling (Z91.81)   Activity Tolerance Patient tolerated treatment well   Patient Left in chair;with call bell/phone within reach;with chair alarm set   Nurse Communication Mobility status        Time: 8553-8498 OT Time Calculation (min): 15 min  Charges: OT General Charges $OT Visit: 1 Visit OT Treatments $Therapeutic Activity: 8-22 mins  Izetta Claude, MS, OTR/L , CBIS ascom [phone]  07/26/24, 3:31 PM

## 2024-07-26 NOTE — Progress Notes (Signed)
 Daily Progress Note   Patient Name: [name]       Date: 07/26/2024 DOB: 1961/05/01  Age: 63 y.o. MRN#: [id number] Attending Physician: Caleen Qualia, MD Primary Care Physician: Osa Geralds, NP Admit Date: 07/11/2024  Reason for Consultation/Follow-up: Establishing goals of care  HPI/Brief Hospital Review:  63 y.o. male  with past medical history of GIB (04/2024), ETOH abuse, COPD with chronic respiratory failure on 2L [place]  at baseline, CAD s/p CABG and recent stent placement s/p LHC (07/02/2024), severe aortic stenosis, CVA, dementia, CHF and HLD admitted from home on 07/11/2024 with shortness of breath and productive cough.   Admitted for COPD exacerbation and acute hypoxic respiratory failure, admission complicated by ETOH withdrawal requiring phenobarbital  and precedex , suffered aspiration event 7/30--led to brief cardiac arrest (x2 minutes prior to ROSC) and required intubation-extubated 8/1--8/6 required HHFNC, has since been weaned to [facility]   Palliative medicine was consulted for assisting with goals of care conversations.  Subjective: Extensive chart review has been completed prior to meeting patient including labs, vital signs, imaging, progress notes, orders, and available advanced directive documents from current and previous encounters.    Visited with [name] at his bedside. He is awake, alert and able to engage in conversations. He reports feeling well today without complaints of acute pain or discomfort. Reports his breathing is better today and seems to be back to his baseline.  [name] again cannot recall events that led to hospitalization and cannot recall conversations had yesterday. He is able to recall our visit. He again cannot provide a person of contact, shares his  friend's wife-[name] can be contacted but not sure he would want her to act as a surrogate decision maker. He again discusses an ex-wife [name] but again discusses trying to convince her to sell her home and move back to [place].  Attempted to elicit goals of care. He shares he understands he received resuscitation this admission and would be accepting of those efforts again.  Answered and addressed all questions and concerns. Need establishment of NOK. PMT to continue to follow for ongoing needs and support.  Objective:  Physical Exam Constitutional:      General: He is not in acute distress.    Appearance: He is ill-appearing.  Pulmonary:     Effort: Pulmonary effort is normal. No  respiratory distress.  Skin:    General: Skin is warm and dry.  Neurological:     Mental Status: He is alert and oriented to person, place, and time.     Comments: Intermittent confusion remains             Vital Signs: BP 122/74   Pulse 77   Temp 97.8 F (36.6 C) (Oral)   Resp 16   Ht 6' (1.829 m)   Wt 101.8 kg   SpO2 97%   BMI 30.44 kg/m  SpO2: SpO2: 97 % O2 Device: O2 Device: High Flow Nasal Cannula O2 Flow Rate: O2 Flow Rate (L/min): 2 L/min   Palliative Care Assessment & Plan   Assessment/Recommendation/Plan  Continue with current plan of care PMT to continue to follow for ongoing needs and support  Care plan was discussed with nursing staff.  Thank you for allowing the Palliative Medicine Team to assist in the care of this patient.  Total time:  25 minutes  Time spent includes: Detailed review of medical records (labs, imaging, vital signs), medically appropriate exam (mental status, respiratory, cardiac, skin), discussed with treatment team, counseling and educating patient, family and staff, documenting clinical information, medication management and coordination of care.  Douglas Lesches, DNP, AGNP-C Palliative Medicine   Please contact Palliative Medicine Team phone  at [phone] for questions and concerns.

## 2024-07-26 NOTE — Plan of Care (Signed)
 Continuing with plan of care.

## 2024-07-26 NOTE — TOC CM/SW Note (Signed)
..  Transition of Care [facility] - Inpatient Brief Assessment   Patient Details  Name: [name] MRN: [id number] Date of Birth: [birth year]  Transition of [facility]) CM/SW Contact:    Edsel DELENA Fischer, LCSW Phone Number: 07/26/2024, 8:16 AM   Clinical Narrative:  SW messaged Amaryllis Dare, MD and Darice Bohr, PTA regarding [facility] placement for pt.  Waiting on response.   Transition of Care Asessment:

## 2024-07-26 NOTE — Progress Notes (Signed)
 Progress Note    [name]  FMW:[id number] DOB: 09/03/1961  DOA: 07/11/2024 PCP: Osa Geralds, NP      Brief Narrative:    Medical records reviewed and are as summarized below:  [name] is a 63 y.o. male with medical history significant of  GIB, alcohol  abuse, COPD on 2 L oxygen, hypertension, CAD with prior CABG and recent Stent placement,, severe aortic stenosis, HLD, stroke, dementia, chronic diastolic CHF, who presented to the hospital on 07/11/2024 with SOB, cough productive of yellowish sputum, sided chest pain.   Patient was recently hospitalized from 7/16 - 7/17 for elective LHC. Pt was s/p of successful direct stenting of distal SVG to PDA.  CTA negative for PE, showed patchy infiltration in the right upper lobe.  Patient is admitted to telemetry bed as inpatient.    He was admitted to the hospitalist service for COPD exacerbation and acute hypoxic respiratory failure.  He developed worsening respiratory distress shortly after admission.  He was placed on BiPAP and was transferred to the ICU for further management.  He has a became agitated and this was attributed to acute alcohol  withdrawal syndrome.  He was started on IV Precedex  infusion for this. He developed acute toxic metabolic encephalopathy and worsening respiratory failure likely from aspiration pneumonia.  He required intubation and mechanical ventilation in the ICU.  He became hypotensive with induction meds despite IV fluids and IV Levophed  infusion.  There was brief loss of pulse but ROSC was achieved after 2 minutes of CPR and 1 dose of epinephrine .    Significant Hospital Events: Including procedures, antibiotic start and stop dates in addition to other pertinent events   7/25: admit with respiratory failure 7/26: clinically decompensated, worsening respiratory status initiated on BiPAP 7/27: on nasal cannula, reports anxiety and tremors consistent with EtOH withdrawal.  Received a total of 15  mg/kg of phenobarbital , however he remained agitated requiring low dose precedex  gtt.  7/28: Pt resting in bed no agitation on precedex  gtt @0 .5 mcg/min.  Orders placed to transfuse 1 unit of pRBC's hgb 7.2 goal hgb 8.0.  No signs of active bleeding  7/29: Pt remains encephalopathic with severe DT's requiring precedex  gtt.  NGT placed to initiate tube feeds and administer medications  7/30: Earlier this morning with vomiting, concern for aspiration event as now with increased WOB and increasing FiO2 requirements (up to 4L). Remains encephalopathic on Precedex .  Respiratory status continued to decline, progressed to agonal respirations, Required INTUBATION for airway protection.  Hypotensive with induction meds despite IV fluids and Levophed  resulting in brief loss of pulse (ROSC obtained after 2 minutes of CPR and 1 epi).  Central line placed due to vasopressors. 7/31: Overnight with episode on non-sustained SVT which converted spontaneously, remains on Amiodarone  gtt per Cardiology.  Nursing reported black tarry stools overnight, Hgb remains stable.   Levophed  weaned off, on minimal vent settings, will perform WUA and SBT as mental status allows.  With fever of 101 F overnight and WBC nearly doubled, tracheal aspirate results pending, will check blood cultures and UA, broaden to empiric Vancomycin  and Zosyn  pending culture results.   08/01:  No significant events reported overnight. Received 1 unit pRBC's overnight for Hgb 7.5 with appropriate response to 8.8 and has since remained stable with no reports of bleeding from nursing.  Fevers resolved, WBC improved with broadening to Zosyn  yesterday, cultures currently with no growth.  Weaned off levophed .  On minimal vent support, passed WUA and SBT,  EXTUBATED TO HHFNC.  Diuresed with 20 mg IV Lasix  x1 dose with extubation, needs aggressive pulmonary toilet. 8/6: Patient with worsening respiratory status and oxygen requirement this morning, appears more  congested.  Apparently concern of aspiration event yesterday evening when his NG tube was dislodged.  Ordered repeat chest x-ray and sputum culture, worsening leukocytosis and procalcitonin at 0.26.  Starting on cefepime , Mucomyst  and giving 1 dose of IV Lasix .  Currently on 10 L of oxygen 8/7: Worsening respiratory status and increased work of breathing overnight, patient was transferred back to stepdown and started on heated high flow at 70% FiO2. Tube feed was held.  Chest x-ray with unchanged interstitial opacities bilaterally, possibly persistent interstitial edema and underlying emphysematous changes.  Also shows similar appearing lateral right upper lobe zone nodular opacity.  Patient was started on IV diuresis along with extensive pulmonary toileting.  BNP increased to 3252, troponin at 72 with flat curve, MRSA PCR negative PCCM was reconsulted as patient is high risk for reintubation. Palliative care was also consulted. 8/8: Clinically improving as of improving oxygen requirement, sputum cultures with normal respiratory flora. Saw alert team started on dysphagia 2 diet with thin liquid.  Hemoglobin decreased to 7.7 so ordered another unit of PRBC as goal hemoglobin is above 8 due to recent NSTEMI.  Continuing IV diuresis 8/9: Slowly improving hypoxia, now on 4 L of oxygen, clinically improved volume status with slight increase in creatinine today, cardiology is planning to switch to p.o. Diamox  from tomorrow.  Worsening leukocytosis today and hemoglobin improved to 8.7 after getting 1 more unit of PRBC yesterday.   Assessment/Plan:   Principal Problem:   Acute respiratory distress Active Problems:   COPD exacerbation (HCC)   Lobar pneumonia (HCC)   Elevated lactic acid level   Metabolic acidosis, increased anion gap   Essential hypertension   Acute on chronic diastolic CHF (congestive heart failure) (HCC)   Hyperlipidemia   Stroke (HCC)   CAD (coronary artery disease)   Iron   deficiency anemia   Alcohol  abuse   Cognitive impairment   Myocardial injury   Nutrition Problem: Inadequate oral intake Etiology: lethargy/confusion  Signs/Symptoms: NPO status   Body mass index is 30.44 kg/m.  (Obesity)   Acute on chronic hypoxic respiratory failure: Improving Patient was able to wean to 4 L of oxygen from heated high flow. Started on IV diuresis-continue with IV diuresis for today - Continue supplemental oxygen-wean as tolerated S/p extubation on 07/18/2024.  Was intubated on 07/16/2024.  Aspiration pneumonia: Another concern of aspiration yesterday evening with resultant worsening respiratory status. Worsening leukocytosis again.  PCT at 0.26  Completed IV Zosyn  on 07/21/2024. Suspected septic shock: Resolved.  He is off of IV Levophed  infusion.  Repeat chest x-ray with persistent opacities And another slight worsening of leukocytosis today-can be due to steroid. -Continue with cefepime  -Sputum culture-normal respiratory flora-MRSA PCR was negative - Mucomyst  nebulizer due to increased congestion and respiratory secretions. - Aggressive pulmonary toilet - Continue with supportive care  COPD exacerbation: Continue bronchodilators.   Patient was again started on Solu-Medrol  by PCCM. Decrease the dose of Solu-Medrol  today.  Acute toxic metabolic encephalopathy: Mental status is improving.  Continue supportive care Alcohol  use disorder with alcohol  withdrawal syndrome: He is off of IV Precedex  infusion.  Continue IV thiamine .  Brief cardiac arrest due to severe hypotension from induction meds on 07/16/2024 (ROSC achieved after 2 minutes of CPR and 1 dose of epinephrine ).  Acute NSTEMI, elevated troponins (peaked at 5,766), CAD,  recent elective left heart cath s/p DES to SVG to PDA 07/02/2024,  history of CABG x 3 (May 2013): Continue aspirin , Plavix  and rosuvastatin .  Acute on chronic diastolic CHF, severe aortic stenosis:  2D echo on 05/10/2024 showed EF  estimated at 60 to 65%, grade 1 diastolic dysfunction, severe aortic valve stenosis. Repeat BNP shows worsening and now at 3252 - Started on twice daily IV diuresis-continue for today and likely will switch to p.o. from tomorrow - Daily weight and BNP - Strict intake and output  Chronic anemia, history of GI bleed: S/p transfusion with 4 unit of PRBCs on 07/25/2024, 07/21/2024, 7/28 , 07/17/2024 Hemoglobin at 8.7 today- No evidence of overt GI bleeding thus far.   Continue IV Protonix .  He was evaluated by gastroenterologist on 07/13/2024.  GI signed off. 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  Continue enteral nutrition.  Monitor BMP.  AKI: Resolved  Hyperglycemia: Glucose levels are stable.  Hemoglobin A1c was 5.3 on 10/28/2022   Comorbidities include hypertension, hyperlipidemia, history of stroke   Diet Order             DIET DYS 2 Room service appropriate? Yes with Assist; Fluid consistency: Thin  Diet effective now                  Consultants: Physiological scientist  Procedures: Intubated on 07/16/2024 and extubated on 07/18/2024 Left IJ central line placed on 07/16/2024  Medications:    acetaZOLAMIDE  ER  500 mg Oral Q12H   amiodarone   400 mg Oral BID   Followed by   NOREEN ON 07/28/2024] amiodarone   200 mg Oral Daily   ascorbic acid   500 mg Oral Daily   aspirin   81 mg Oral Daily   budesonide  (PULMICORT ) nebulizer solution  0.5 mg Nebulization BID   Chlorhexidine  Gluconate Cloth  6 each Topical Q0600   clopidogrel   75 mg Oral Daily   enoxaparin  (LOVENOX ) injection  40 mg Subcutaneous QHS   feeding supplement  237 mL Oral TID BM   folic acid   1 mg Oral Daily   furosemide   40 mg Intravenous BID    ipratropium-albuterol   3 mL Nebulization Q6H   iron  polysaccharides  150 mg Oral Daily   methylPREDNISolone  (SOLU-MEDROL ) injection  20 mg Intravenous BID   metoprolol  succinate  12.5 mg Oral Daily   multivitamin with minerals  1 tablet Oral Daily   mouth rinse  15 mL Mouth Rinse 4 times per day   pantoprazole  (PROTONIX ) IV  40 mg Intravenous Q12H   polyethylene glycol  17 g Oral Daily   rosuvastatin   20 mg Oral Daily   thiamine   100 mg Oral Daily   Vitamin D  (Ergocalciferol )  50,000 Units Oral Q7 days   Continuous Infusions:     Anti-infectives (From admission, onward)    Start     Dose/Rate Route Frequency Ordered Stop   07/23/24 1400  ceFEPIme  (MAXIPIME ) 2 g in sodium chloride  0.9 % 100 mL IVPB  Status:  Discontinued        2 g 200 mL/hr over 30 Minutes Intravenous Every 8 hours 07/23/24 1203 07/24/24 1028   07/17/24 1000  piperacillin -tazobactam (ZOSYN ) IVPB 3.375 g  Status:  Discontinued        3.375 g 12.5  mL/hr over 240 Minutes Intravenous Every 8 hours 07/17/24 0811 07/21/24 1100   07/17/24 1000  vancomycin  (VANCOREADY) IVPB 2000 mg/400 mL  Status:  Discontinued        2,000 mg 200 mL/hr over 120 Minutes Intravenous  Once 07/17/24 0811 07/17/24 1026   07/14/24 1200  cefTRIAXone  (ROCEPHIN ) 2 g in sodium chloride  0.9 % 100 mL IVPB        2 g 200 mL/hr over 30 Minutes Intravenous Every 24 hours 07/14/24 1030 07/16/24 1204   07/12/24 1600  piperacillin -tazobactam (ZOSYN ) IVPB 3.375 g  Status:  Discontinued        3.375 g 12.5 mL/hr over 240 Minutes Intravenous Every 8 hours 07/12/24 1503 07/14/24 1030   07/12/24 0900  vancomycin  (VANCOREADY) IVPB 1250 mg/250 mL  Status:  Discontinued        1,250 mg 166.7 mL/hr over 90 Minutes Intravenous Every 12 hours 07/11/24 2012 07/12/24 1458   07/11/24 2100  ceFEPIme  (MAXIPIME ) 2 g in sodium chloride  0.9 % 100 mL IVPB  Status:  Discontinued        2 g 200 mL/hr over 30 Minutes Intravenous Every 8 hours 07/11/24 1959 07/12/24 1458    07/11/24 2100  vancomycin  (VANCOREADY) IVPB 2000 mg/400 mL        2,000 mg 200 mL/hr over 120 Minutes Intravenous  Once 07/11/24 2004 07/11/24 2255   07/11/24 1945  cefTRIAXone  (ROCEPHIN ) 1 g in sodium chloride  0.9 % 100 mL IVPB  Status:  Discontinued        1 g 200 mL/hr over 30 Minutes Intravenous  Once 07/11/24 1930 07/11/24 1933   07/11/24 1945  azithromycin  (ZITHROMAX ) 500 mg in sodium chloride  0.9 % 250 mL IVPB  Status:  Discontinued        500 mg 250 mL/hr over 60 Minutes Intravenous  Once 07/11/24 1930 07/11/24 1955   07/11/24 1945  cefTRIAXone  (ROCEPHIN ) 2 g in sodium chloride  0.9 % 100 mL IVPB  Status:  Discontinued        2 g 200 mL/hr over 30 Minutes Intravenous  Once 07/11/24 1933 07/11/24 1955       Family Communication/Anticipated D/C date and plan/Code Status    DVT prophylaxis: Lovenox      Code Status: Full Code  Family Communication: Discussed with patient  Disposition Plan: Plan to discharge to SNF   Status is: Inpatient Remains inpatient appropriate because: Acute respiratory failure, aspiration pneumonia   Subjective:  Patient was seen and examined today.  Still having some cough.  Had 1 vomitus last night after eating a lot of food, stating that he was feeling very hungry.  No more nausea or vomiting.  Objective:    Vitals:   07/26/24 1000 07/26/24 1100 07/26/24 1200 07/26/24 1317  BP: 122/72 (!) 140/69 126/73   Pulse: 89 81 78   Resp: 17 18 16    Temp:   97.8 F (36.6 C)   TempSrc:   Oral   SpO2: 99% 96% 94% 92%  Weight:      Height:       No data found.   Intake/Output Summary (Last 24 hours) at 07/26/2024 1355 Last data filed at 07/26/2024 0600 Gross per 24 hour  Intake 917.67 ml  Output 2350 ml  Net -1432.33 ml   Filed Weights   07/23/24 0500 07/24/24 0500 07/25/24 0500  Weight: 98.5 kg 101.8 kg 101.8 kg    Exam: General.  Frail gentleman, in no acute distress. Pulmonary.  Lungs clear bilaterally, normal  respiratory effort. CV.   Regular rate and rhythm, no JVD, rub or murmur. Abdomen.  Soft, nontender, nondistended, BS positive. CNS.  Alert and oriented .  No focal neurologic deficit. Extremities.  No edema, no cyanosis, pulses intact and symmetrical. Psychiatry.  Judgment and insight appears normal.     Data Reviewed:   I have personally reviewed following labs and imaging studies:  Labs: Labs show the following:   Basic Metabolic Panel: Recent Labs  Lab 07/21/24 0455 07/22/24 0416 07/23/24 0457 07/24/24 0406 07/25/24 0439 07/25/24 1334 07/26/24 0426  NA 145   < > 142 141 144 140 137  K 4.0   < > 4.1 4.3 4.8 4.2 3.7  CL 105   < > 101 99 99 95* 94*  CO2 33*   < > 32 33* 34* 31 32  GLUCOSE 193*   < > 138* 160* 125* 117* 170*  BUN 33*   < > 37* 48* 54* 55* 53*  CREATININE 1.15   < > 1.12 1.05 1.15 1.20 1.38*  CALCIUM  8.4*   < > 8.6* 8.7* 8.5* 8.5* 8.6*  MG  --   --  2.7*  --  2.9*  --  2.4  PHOS 3.4  --  4.6 4.9* 5.0*  --  4.0   < > = values in this interval not displayed.   GFR Estimated Creatinine Clearance: 65.9 mL/min (A) (by C-G formula based on SCr of 1.38 mg/dL (H)). Liver Function Tests: Recent Labs  Lab 07/20/24 0531 07/21/24 0455 07/24/24 0406  ALBUMIN  3.0* 2.7* 2.9*   No results for input(s): LIPASE, AMYLASE in the last 168 hours. No results for input(s): AMMONIA in the last 168 hours.  Coagulation profile No results for input(s): INR, PROTIME in the last 168 hours.  CBC: Recent Labs  Lab 07/22/24 0416 07/23/24 0457 07/24/24 0406 07/25/24 0439 07/26/24 0426  WBC 14.0* 18.8* 15.7* 10.5 16.1*  HGB 8.6* 9.3* 8.1* 7.7* 8.7*  HCT 28.3* 31.0* 27.5* 26.2* 28.3*  MCV 91.6 92.5 93.9 95.6 93.4  PLT 247 247 246 231 274   Cardiac Enzymes: No results for input(s): CKTOTAL, CKMB, CKMBINDEX, TROPONINI in the last 168 hours. BNP (last 3 results) No results for input(s): PROBNP in the last 8760 hours. CBG: Recent Labs  Lab 07/25/24 0813 07/25/24 1149  07/25/24 1615 07/26/24 0738 07/26/24 1254  GLUCAP 102* 127* 148* 128* 158*   D-Dimer: No results for input(s): DDIMER in the last 72 hours. Hgb A1c: No results for input(s): HGBA1C in the last 72 hours. Lipid Profile: No results for input(s): CHOL, HDL, LDLCALC, TRIG, CHOLHDL, LDLDIRECT in the last 72 hours.  Thyroid function studies: No results for input(s): TSH, T4TOTAL, T3FREE, THYROIDAB in the last 72 hours.  Invalid input(s): FREET3 Anemia work up: No results for input(s): VITAMINB12, FOLATE, FERRITIN, TIBC, IRON , RETICCTPCT in the last 72 hours. Sepsis Labs: Recent Labs  Lab 07/23/24 0457 07/24/24 0406 07/25/24 0439 07/26/24 0426  PROCALCITON 0.26  --   --   --   WBC 18.8* 15.7* 10.5 16.1*    Microbiology Recent Results (from the past 240 hours)  Culture, Respiratory w Gram Stain     Status: None   Collection Time: 07/16/24  4:02 PM   Specimen: Tracheal Aspirate; Respiratory  Result Value Ref Range Status   Specimen Description   Final    TRACHEAL ASPIRATE Performed at [facility], [address], [place], [place] [address]    Special Requests   Final  NONE Performed at [facility], [address] [place], [place] [address]    Gram Stain   Final    RARE WBC PRESENT, PREDOMINANTLY PMN NO ORGANISMS SEEN    Culture   Final    NO GROWTH 2 DAYS Performed at [facility], [address], [place], [place] [address]    Report Status 07/19/2024 FINAL  Final  MRSA Next Gen by PCR, Nasal     Status: None   Collection Time: 07/17/24  8:26 AM   Specimen: Nasal Mucosa; Nasal Swab  Result Value Ref Range Status   MRSA by PCR Next Gen NOT DETECTED NOT DETECTED Final    Comment: (NOTE) The GeneXpert MRSA Assay (FDA approved for NASAL specimens only), is one component of a comprehensive MRSA colonization surveillance program. It is not intended to diagnose MRSA infection nor to guide or monitor  treatment for MRSA infections. Test performance is not FDA approved in patients less than 22 years old. Performed at [facility], [address] [place], [place] [address]   Culture, blood (Routine X 2) w Reflex to ID Panel     Status: None   Collection Time: 07/17/24  9:57 AM   Specimen: BLOOD  Result Value Ref Range Status   Specimen Description BLOOD BLOOD LEFT HAND  Final   Special Requests   Final    BOTTLES DRAWN AEROBIC AND ANAEROBIC Blood Culture adequate volume   Culture   Final    NO GROWTH 5 DAYS Performed at [facility], [address], [place], [place] [address]    Report Status 07/22/2024 FINAL  Final  Culture, blood (Routine X 2) w Reflex to ID Panel     Status: None   Collection Time: 07/17/24  9:57 AM   Specimen: BLOOD  Result Value Ref Range Status   Specimen Description BLOOD BLOOD LEFT WRIST  Final   Special Requests   Final    BOTTLES DRAWN AEROBIC AND ANAEROBIC Blood Culture adequate volume   Culture   Final    NO GROWTH 5 DAYS Performed at [facility], [address] [place], [place] [address]    Report Status 07/22/2024 FINAL  Final  Culture, Respiratory w Gram Stain     Status: None   Collection Time: 07/23/24 11:32 AM   Specimen: Sputum  Result Value Ref Range Status   Specimen Description   Final    SPU Performed at [facility], [address], [place], [place] [address]    Special Requests   Final    NONE Performed at [facility], [address] [place], [place] [address]    Gram Stain   Final    RARE WBC PRESENT, PREDOMINANTLY PMN RARE GRAM POSITIVE COCCI    Culture   Final    Normal respiratory flora-no Staph aureus or Pseudomonas seen Performed at [facility], [address] [place], [place] [address]    Report Status 07/25/2024 FINAL  Final  MRSA Next Gen by PCR, Nasal     Status: None   Collection Time: 07/24/24 11:08 AM   Specimen: Nasal Mucosa; Nasal Swab  Result  Value Ref Range Status   MRSA by PCR Next Gen NOT DETECTED NOT DETECTED Final    Comment: (NOTE) The GeneXpert MRSA Assay (FDA approved for NASAL specimens only), is one component of a comprehensive MRSA colonization surveillance program. It is not intended to diagnose MRSA infection nor to guide or monitor treatment  for MRSA infections. Test performance is not FDA approved in patients less than 57 years old. Performed at [facility], [address] [place], [place] [address]     Procedures and diagnostic studies:  No results found.    LOS: 15 days   Amaryllis Dare MD  [facility]   Pager on [email or web address]. If 7PM-7AM, please contact night-coverage at [email or web address]  07/26/2024, 1:55 PM

## 2024-07-26 NOTE — Progress Notes (Signed)
 [facility] CLINIC CARDIOLOGY PROGRESS NOTE       Patient ID: [name] MRN: [id number] DOB/AGE: 1961/09/02 63 y.o.  Admit date: 07/11/2024 Referring Physician Dr. Charlie Sellar Primary Physician Osa Geralds, NP  Primary Cardiologist Tinnie Maiden, NP  Reason for Consultation elevated troponins  HPI: [name] is a 63 y.o. 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 Found to have pneumonia, overnight troponins uptrending. Cardiology was consulted for further evaluation.   Interval history: -Patient seen and examined this AM, resting in bed receiving breathing treatment. Reports SOB is stable today. Denies CP, palpitations.  -Patient BP and HR stable this AM. No significant events on telemetry.  -Slight bump in Cr today but with good UOP yesterday.   Review of systems complete and found to be negative unless listed above    Past Medical History:  Diagnosis Date   COPD (chronic obstructive pulmonary disease) (HCC)    CVA (cerebral infarction)    Headache    mild, since stroke 2012   Hypertension    MI (myocardial infarction) (HCC) 05/09/2012   Reading difficulty    pt reports he reads at about a second grade level   Stroke [facility]) 2012   numbness to left hand    Wears dentures    full upper and lower    Past Surgical History:  Procedure Laterality Date   CAROTID ENDARTERECTOMY Left    2012 or 2013   CHOLECYSTECTOMY N/A 07/26/2016   Procedure: LAPAROSCOPIC CHOLECYSTECTOMY;  Surgeon: Charlie FORBES Fell, MD;  Location: [facility] ORS;  Service: General;  Laterality: N/A;   COLONOSCOPY N/A 05/08/2024   Procedure: COLONOSCOPY;  Surgeon: Jinny Carmine, MD;  Location: [facility] ENDOSCOPY;  Service: Endoscopy;  Laterality: N/A;   COLONOSCOPY WITH PROPOFOL  N/A 02/09/2022   Procedure: COLONOSCOPY WITH PROPOFOL ;  Surgeon: Jinny Carmine, MD;  Location: [facility] ENDOSCOPY;  Service: Endoscopy;  Laterality: N/A;   CORONARY ARTERY BYPASS GRAFT  05/10/2012   3 vessel   CORONARY STENT INTERVENTION N/A 07/02/2024   Procedure: CORONARY STENT INTERVENTION;  Surgeon: Katina Albright, MD;  Location: [facility] INVASIVE CV LAB;  Service: Cardiovascular;  Laterality: N/A;   CORONARY ULTRASOUND/IVUS N/A 07/02/2024   Procedure: Coronary Ultrasound/IVUS;  Surgeon: Katina Albright, MD;  Location: [facility] INVASIVE CV LAB;  Service: Cardiovascular;  Laterality: N/A;   ENTEROSCOPY N/A 05/16/2023   Procedure: ENTEROSCOPY;  Surgeon: Therisa Bi, MD;  Location: [facility] ENDOSCOPY;  Service: Gastroenterology;  Laterality: N/A;   ENTEROSCOPY N/A 06/01/2023   Procedure: ENTEROSCOPY;  Surgeon: Unk Corinn Skiff, MD;  Location: [facility] ENDOSCOPY;  Service: Gastroenterology;  Laterality: N/A;   ESOPHAGOGASTRODUODENOSCOPY  05/13/2023   Procedure: ESOPHAGOGASTRODUODENOSCOPY (EGD);  Surgeon: Jinny Carmine, MD;  Location: [facility] ENDOSCOPY;  Service: Endoscopy;;   ESOPHAGOGASTRODUODENOSCOPY (EGD) WITH PROPOFOL  N/A 10/28/2022   Procedure: ESOPHAGOGASTRODUODENOSCOPY (EGD) WITH PROPOFOL ;  Surgeon: Onita Elspeth Sharper, DO;  Location: [facility] ENDOSCOPY;  Service: Gastroenterology;  Laterality: N/A;   GIVENS CAPSULE STUDY  05/13/2023   Procedure: GIVENS CAPSULE STUDY;  Surgeon: Jinny Carmine, MD;  Location: [facility] ENDOSCOPY;  Service: Endoscopy;;   GIVENS CAPSULE STUDY  06/01/2023   Procedure: GIVENS CAPSULE STUDY;  Surgeon: Unk Corinn Skiff, MD;  Location: [facility] ENDOSCOPY;  Service: Gastroenterology;;   LEFT HEART CATH AND CORS/GRAFTS ANGIOGRAPHY N/A 07/02/2024   Procedure: LEFT HEART CATH AND CORS/GRAFTS ANGIOGRAPHY;  Surgeon: Katina Albright, MD;  Location: [facility] INVASIVE CV LAB;  Service: Cardiovascular;  Laterality: N/A;     Medications Prior to Admission  Medication Sig Dispense Refill Last Dose/Taking   albuterol  (VENTOLIN  HFA) 108 (90 Base) MCG/ACT inhaler Inhale 2 puffs into the lungs every 6 (six) hours as needed for wheezing or shortness of breath. 6.7 g 2 07/11/2024 Morning   ascorbic acid  (VITAMIN C ) 500 MG tablet Take 1 tablet (500 mg total) by mouth daily. 30 tablet 2 07/11/2024 Morning   aspirin  EC 81 MG tablet Take 1 tablet (81 mg total) by mouth daily. Swallow whole. 30 tablet 5 07/11/2024 Morning   clopidogrel  (PLAVIX ) 75 MG tablet Take 1 tablet (75 mg total) by mouth daily with breakfast. 30 tablet 11 07/11/2024 Morning   dapagliflozin  propanediol (FARXIGA ) 10 MG TABS tablet Take 1 tablet (10 mg total) by mouth daily. 30 tablet 11 07/11/2024 Morning   folic acid  (FOLVITE ) 1 MG tablet Take 1 tablet (1 mg total) by mouth daily. 30 tablet 2 07/11/2024   iron  polysaccharides (NIFEREX) 150 MG capsule Take 1 capsule (150 mg total) by mouth daily. 30 capsule 2 07/11/2024   isosorbide  mononitrate (IMDUR ) 60 MG 24 hr tablet Take 1 tablet (60 mg total) by mouth daily. 30 tablet 11 07/11/2024   losartan  (COZAAR ) 25 MG tablet Take 0.5 tablets (12.5 mg total) by mouth daily. 30 tablet 11 07/11/2024 Morning   metoprolol  succinate (TOPROL -XL) 25 MG 24 hr tablet Take 0.5 tablets (12.5 mg total) by mouth daily. 30 tablet 11 07/11/2024 Morning   pantoprazole  (PROTONIX ) 40 MG tablet Take 1 tablet (40 mg total) by mouth daily. 30 tablet 2 07/11/2024 Morning   pravastatin  (PRAVACHOL ) 20 MG tablet Take 1 tablet (20 mg total) by mouth at bedtime. 30 tablet 11 07/10/2024 Evening   spironolactone  (ALDACTONE ) 25 MG tablet Take 0.5 tablets (12.5 mg total) by mouth daily. 30 tablet 11 07/11/2024 Morning   torsemide  (DEMADEX ) 10 MG tablet Take 1 tablet (10 mg total) by mouth daily. 30 tablet 11 07/11/2024   Social History   Socioeconomic History   Marital status: Divorced    Spouse name: Not on file   Number of children: Not on file    Years of education: Not on file   Highest education level: Not on file  Occupational History   Not on file  Tobacco Use   Smoking status: Former    Current packs/day: 0.00    Types: Cigarettes    Quit date: 32    Years since quitting: 32.6   Smokeless tobacco: Current    Types: Snuff   Tobacco comments:    changed to dip  Vaping Use   Vaping status: Never Used  Substance and Sexual Activity   Alcohol  use: Yes    Alcohol /week: 26.0 standard drinks of alcohol     Types: 26 Cans of beer per week   Drug use: No   Sexual activity: Yes    Birth control/protection: None  Other Topics Concern   Not on file  Social History Narrative   Not on file   Social Drivers of Health   Financial Resource Strain: Not on file  Food Insecurity: No Food Insecurity (07/12/2024)   Hunger Vital Sign    Worried About Running Out of Food in the Last Year: Never true    Ran Out of Food in the  Last Year: Never true  Transportation Needs: No Transportation Needs (07/12/2024)   PRAPARE - Administrator, Civil Service (Medical): No    Lack of Transportation (Non-Medical): No  Physical Activity: Not on file  Stress: Not on file  Social Connections: Socially Isolated (07/12/2024)   Social Connection and Isolation Panel    Frequency of Communication with Friends and Family: Once a week    Frequency of Social Gatherings with Friends and Family: Once a week    Attends Religious Services: Never    Database administrator or Organizations: No    Attends Banker Meetings: Never    Marital Status: Divorced  Catering manager Violence: Not At Risk (07/12/2024)   Humiliation, Afraid, Rape, and Kick questionnaire    Fear of Current or Ex-Partner: No    Emotionally Abused: No    Physically Abused: No    Sexually Abused: No    Family History  Problem Relation Age of Onset   Pneumonia Mother      Vitals:   07/26/24 0400 07/26/24 0500 07/26/24 0600 07/26/24 0730  BP: 130/74 125/72  125/78   Pulse: 86 78 79   Resp: 19 17 16    Temp: 97.8 F (36.6 C)     TempSrc: Oral     SpO2: 99% 100% 91% 99%  Weight:      Height:        PHYSICAL EXAM General: Ill appearing male, no acute distress. HEENT: Normocephalic and atraumatic. Neck: No JVD.  Lungs: Douglas respiratory effort.  Diminished breath sounds with bibasilar crackles. Heart: HRRR, Douglas S1 and S2 without gallops. +systolic ejection murmur.   Abdomen: Non-distended appearing.  Msk: Douglas strength and tone for age. Extremities: Warm and well perfused. No clubbing, cyanosis, No edema.   Labs: Basic Metabolic Panel: Recent Labs    07/25/24 0439 07/25/24 1334 07/26/24 0426  NA 144 140 137  K 4.8 4.2 3.7  CL 99 95* 94*  CO2 34* 31 32  GLUCOSE 125* 117* 170*  BUN 54* 55* 53*  CREATININE 1.15 1.20 1.38*  CALCIUM  8.5* 8.5* 8.6*  MG 2.9*  --  2.4  PHOS 5.0*  --  4.0   Liver Function Tests: Recent Labs    07/24/24 0406  ALBUMIN  2.9*   No results for input(s): LIPASE, AMYLASE in the last 72 hours. CBC: Recent Labs    07/25/24 0439 07/26/24 0426  WBC 10.5 16.1*  HGB 7.7* 8.7*  HCT 26.2* 28.3*  MCV 95.6 93.4  PLT 231 274   Cardiac Enzymes: Recent Labs    07/24/24 0406 07/24/24 0718 07/24/24 0922  TROPONINIHS 72* 75* 68*     BNP: Recent Labs    07/24/24 0718  BNP 3,252.3*    D-Dimer: No results for input(s): DDIMER in the last 72 hours. Hemoglobin A1C: No results for input(s): HGBA1C in the last 72 hours. Fasting Lipid Panel: No results for input(s): CHOL, HDL, LDLCALC, TRIG, CHOLHDL, LDLDIRECT in the last 72 hours.  Thyroid Function Tests: No results for input(s): TSH, T4TOTAL, T3FREE, THYROIDAB in the last 72 hours.  Invalid input(s): FREET3 Anemia Panel: No results for input(s): VITAMINB12, FOLATE, FERRITIN, TIBC, IRON , RETICCTPCT in the last 72 hours.   Radiology: DG Chest 1 View Result Date: 07/24/2024 CLINICAL DATA:  [id number]  Dyspnea [id number] EXAM: CHEST  1 VIEW COMPARISON:  July 23, 2024, July 11, 2024 FINDINGS: Esophagogastric tube terminates below the diaphragm, outside the field of view. Diffuse interstitial opacities throughout both lungs.  No lobar consolidation or pneumothorax. Lateral right upper lobe nodular opacity measures 1.9 cm, corresponding to the lung nodule on the prior chest CT. Moderate cardiomegaly. Sternotomy wires and CABG markers. No acute fracture or destructive lesions. Multilevel thoracic osteophytosis. IMPRESSION: 1. Moderate cardiomegaly. Unchanged interstitial opacities throughout both lungs, possibly persistent interstitial edema and underlying emphysematous changes. 2. Similarly appearing lateral right upper lung zone nodular opacity. Please see the comparison chest CT for further characterization. Electronically Signed   By: Rogelia Myers M.D.   On: 07/24/2024 08:43   DG Chest Port 1 View Result Date: 07/23/2024 CLINICAL DATA:  Shortness of breath. EXAM: PORTABLE CHEST 1 VIEW COMPARISON:  July 22, 2024. FINDINGS: The heart size and mediastinal contours are within Douglas limits. Status post coronary bypass graft. Mild diffuse interstitial densities are noted concerning for possible pulmonary edema with possible small pleural effusions. Nasogastric tube is seen entering stomach. The visualized skeletal structures are unremarkable. IMPRESSION: Mild diffuse interstitial densities are noted concerning for possible pulmonary edema with small pleural effusions. Electronically Signed   By: Lynwood Landy Raddle M.D.   On: 07/23/2024 13:03   DG ABD ACUTE 2+V W 1V CHEST Result Date: 07/22/2024 CLINICAL DATA:  [id number] Dyspnea [id number] [phone] Encounter for imaging study to confirm nasogastric (NG) tube placement [id number] EXAM: DG ABDOMEN ACUTE WITH 1 VIEW CHEST COMPARISON:  Chest x-ray 07/19/2024 FINDINGS: Enteric tube courses below the diaphragm with tip and side port overlying the expected region of the gastric lumen. The  heart and mediastinal contours are within Douglas limits. No focal consolidation. Chronic coarsened interstitial markings with no overt pulmonary edema. Bilateral trace pleural effusion. No pneumothorax. Right upper quadrant surgical clips. There is no evidence of dilated bowel loops or free intraperitoneal air. No radiopaque calculi or other significant radiographic abnormality is seen. No acute osseous abnormality.  Intact sternotomy wires. IMPRESSION: 1. Enteric tube in good position. 2. Bowel trace pleural effusions. 3. Nonobstructive bowel gas pattern. Electronically Signed   By: Morgane  Naveau M.D.   On: 07/22/2024 14:18   DG Chest Port 1 View Result Date: 07/19/2024 CLINICAL DATA:  Dyspnea EXAM: PORTABLE CHEST 1 VIEW COMPARISON:  07/18/2024 FINDINGS: Two frontal views of the chest demonstrate an enteric catheter passing below diaphragm, tip excluded by collimation. Left internal jugular catheter tip overlies superior vena cava unchanged. 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 The cardiac silhouette is stable. Mild central vascular congestion, with a trace right pleural effusion unchanged since prior exam. The nodular density projecting over the right upper lateral chest on prior study is less well seen on this exam. This may be due to positioning. No pneumothorax. No acute bony abnormalities. IMPRESSION: 1. Pulmonary vascular congestion and small right pleural effusion, unchanged since prior study. 2. The nodular consolidation within the right upper lateral chest seen on prior x-ray and CT is not well seen on this exam, likely due to patient positioning. Electronically Signed   By: Ozell Daring M.D.   On: 07/19/2024 17:34   DG Chest Port 1 View Result Date: 07/18/2024 EXAM: 1 VIEW XRAY OF THE CHEST 07/18/2024 04:59:06 AM COMPARISON: 07/16/2024 CLINICAL HISTORY: [id number] Acute respiratory failure with hypoxia (HCC) 427266. Acute respiratory failure with hypoxia FINDINGS: LUNGS AND PLEURA: Nodular opacity  within the periphery of the right upper lobe appears similar to CT from 07/11/2024. Please refer to that report for follow-up recommendations. Mild asymmetric hazy opacification over the right mid and right lower lung may be related to positional artifact versus small posterior layering effusion.  Mild subsegmental atelectasis in the left base. HEART AND MEDIASTINUM: Stable cardiomediastinal contours. Previous median sternotomy and CABG procedure. BONES AND SOFT TISSUES: Aortic atherosclerotic calcification. Left IJ catheter is identified with the tip in the projection of the SVC. There is an enteric tube which courses below the field of view. ET tube tip is stable above the carina. IMPRESSION: 1. Stable nodular opacity within the periphery of the right upper lobe, similar to CT from 07/11/2024. Please refer to that report for follow-up recommendations. 2. Mild asymmetric hazy opacification over the right mid and right lower lung, possibly related to rotational artifact versus small posterior layering effusion. 3. Mild subsegmental atelectasis in the left base. Electronically signed by: Waddell Calk MD 07/18/2024 06:51 AM EDT RP Workstation: [id number]   DG Chest Port 1 View Result Date: 07/16/2024 CLINICAL DATA:  Endotracheal tube and central line placement. EXAM: PORTABLE CHEST 1 VIEW COMPARISON:  07/16/2024 at 8:58 a.m. FINDINGS: Patient is rotated to the right. Endotracheal tube has tip proximally 13.5 cm above the carina. This could be advanced approximately 6 cm. Left IJ central venous catheter has tip over the SVC. Enteric tube courses into the region of the stomach and off the image as tip is not visualized. Lungs are adequately inflated with possible mild hazy opacification over the right upper lung abutting the minor fissure which may be due to atelectasis or infection. Left lung is clear. Cardiomediastinal silhouette and remainder of the exam is unchanged. IMPRESSION: 1. Endotracheal tube has tip  proximally 13.5 cm above the carina. This could be advanced approximately 6 cm. 2. Left IJ central venous catheter with tip over the SVC. 3. Possible mild hazy opacification over the right upper lung abutting the minor fissure which may be due to atelectasis or infection. Electronically Signed   By: Toribio Agreste M.D.   On: 07/16/2024 10:53   DG Chest Port 1 View Result Date: 07/16/2024 CLINICAL DATA:  [id number] Aspiration into airway [id number]. EXAM: PORTABLE CHEST 1 VIEW COMPARISON:  06/23/2024. FINDINGS: Bilateral lungs appear hyperlucent with coarse bronchovascular markings, in keeping with COPD. Bilateral lungs otherwise appear clear. No dense consolidation or lung collapse. Bilateral costophrenic angles are clear. Stable cardio-mediastinal silhouette. There are surgical staples along the heart border and sternotomy wires, status post CABG (coronary artery bypass graft). No acute osseous abnormalities. The soft tissues are within Douglas limits. Enteric tube is seen coursing below the left hemidiaphragm however, the tip is not included on the film. IMPRESSION: No active disease. COPD. Electronically Signed   By: Ree Molt M.D.   On: 07/16/2024 09:09   DG Abd 1 View Result Date: 07/16/2024 CLINICAL DATA:  Vomiting.  NG tube placement. EXAM: ABDOMEN - 1 VIEW COMPARISON:  07/15/2024 FINDINGS: Nasogastric tube has tip and side-port over the stomach in the upper abdomen just left of midline. Visualized lower thorax unchanged. Abdominopelvic images demonstrate a non obstructive bowel gas pattern. Mild fecal retention throughout the colon. Surgical clips over the right upper quadrant. There are degenerative changes of the spine and hips. Calcified plaque over the abdominal aorta and iliac arteries. IMPRESSION: 1. Nasogastric tube with tip and side-port over the stomach. 2. Nonobstructive bowel gas pattern. Mild fecal retention. 3. Aortic atherosclerosis. Electronically Signed   By: Toribio Agreste M.D.   On:  07/16/2024 07:51   DG Abd 1 View Result Date: 07/15/2024 CLINICAL DATA:  Orogastric tube placement. EXAM: ABDOMEN - 1 VIEW COMPARISON:  June 29, 2023. FINDINGS: Distal tip of nasogastric tube is seen in  expected position of proximal stomach. IMPRESSION: Nasogastric tube tip seen in expected position of proximal stomach. Electronically Signed   By: Lynwood Landy Raddle M.D.   On: 07/15/2024 10:25   DG Chest Port 1 View Result Date: 07/13/2024 CLINICAL DATA:  Respiratory failure with hypoxia. EXAM: PORTABLE CHEST 1 VIEW COMPARISON:  Multiple previous chest x-rays and recent chest CT. FINDINGS: Stable surgical changes from bypass surgery. The cardiac silhouette, mediastinal and hilar contours are within Douglas limits and stable. Underlying emphysematous changes and pulmonary scarring. No pleural effusions or discrete pulmonary infiltrates. No pneumothorax. IMPRESSION: Emphysematous changes and pulmonary scarring but no acute overlying pulmonary process. Electronically Signed   By: MYRTIS Stammer M.D.   On: 07/13/2024 16:08   US  Abdomen Limited RUQ (LIVER/GB) Result Date: 07/13/2024 CLINICAL DATA:  Portal hypertension. EXAM: ULTRASOUND ABDOMEN LIMITED RIGHT UPPER QUADRANT COMPARISON:  Abdominal ultrasound 10/19/2022 FINDINGS: Gallbladder: Surgically absent. Common bile duct: Diameter: 3.3 mm Liver: No focal lesion identified. Parenchymal echogenicity is increased and diffusely heterogeneous. There is lobular liver contour. Portal vein is patent on color Doppler imaging with Douglas direction of blood flow towards the liver. Other: None. IMPRESSION: Lobular liver contour with increased echogenicity and heterogeneous parenchyma consistent with cirrhosis. No focal liver lesion identified. Electronically Signed   By: Greig Pique M.D.   On: 07/13/2024 16:03   DG Chest Port 1 View Result Date: 07/12/2024 CLINICAL DATA:  Shortness of breath. EXAM: PORTABLE CHEST 1 VIEW COMPARISON:  07/11/2024 FINDINGS: The cardio  pericardial silhouette is enlarged. There is pulmonary vascular congestion without overt pulmonary edema. Trace bilateral pleural effusions. Old posterior left sixth rib fracture. Telemetry leads overlie the chest. IMPRESSION: Enlargement of the cardiopericardial silhouette with new pulmonary vascular congestion and trace bilateral pleural effusions. Electronically Signed   By: Camellia Candle M.D.   On: 07/12/2024 12:11   CT Angio Chest PE W/Cm &/Or Wo Cm Result Date: 07/11/2024 CLINICAL DATA:  High probability for PE. Chest pain with shortness of breath EXAM: CT ANGIOGRAPHY CHEST WITH CONTRAST TECHNIQUE: Multidetector CT imaging of the chest was performed using the standard protocol during bolus administration of intravenous contrast. Multiplanar CT image reconstructions and MIPs were obtained to evaluate the vascular anatomy. RADIATION DOSE REDUCTION: This exam was performed according to the departmental dose-optimization program which includes automated exposure control, adjustment of the mA and/or kV according to patient size and/or use of iterative reconstruction technique. CONTRAST:  75mL OMNIPAQUE  IOHEXOL  350 MG/ML SOLN COMPARISON:  CT angiogram chest 05/09/2024 FINDINGS: Cardiovascular: Satisfactory opacification of the pulmonary arteries to the segmental level. No evidence of pulmonary embolism. Patient is status post cardiac surgery. The heart is enlarged. There is no pericardial effusion. There are atherosclerotic calcifications of the aorta and coronary arteries. Aorta is Douglas in size. Mediastinum/Nodes: There are enlarged right hilar lymph nodes measuring up to 11 mm. Enlarged paratracheal lymph nodes measure up to 10 mm. Visualized thyroid gland and esophagus are within Douglas limits. Lungs/Pleura: Moderate emphysema again seen. There is stable scarring in the right lung apex. There is new patchy slightly nodular airspace disease in the right upper lobe measuring 2.0 by 1.1 cm. There is right-sided  central peribronchial wall thickening. Previously identified airspace disease in the left lower lobe has resolved. There is no pleural effusion or pneumothorax. There is a stable left lower lobe pulmonary nodule image 6/57. Upper Abdomen: No acute abnormality. Musculoskeletal: No chest wall abnormality. No acute or significant osseous findings. Review of the MIP images confirms the above findings. IMPRESSION:  1. No evidence for pulmonary embolism. 2. New patchy slightly nodular airspace disease in the right upper lobe worrisome for pneumonia. Follow-up CT recommended in 4-6 weeks recommended to ensure resolution and to exclude underlying nodule. 3. Right-sided central peribronchial wall thickening worrisome for bronchitis. 4. Right hilar and mediastinal lymphadenopathy, likely reactive. 5. Stable left lower lobe pulmonary nodule. 6. Cardiomegaly. Aortic Atherosclerosis (ICD10-I70.0) and Emphysema (ICD10-J43.9). Electronically Signed   By: Greig Pique M.D.   On: 07/11/2024 18:54   DG Chest Portable 1 View Result Date: 07/11/2024 CLINICAL DATA:  SOB EXAM: PORTABLE CHEST - 1 VIEW COMPARISON:  June 24, 2024 FINDINGS: Biapical pleural thickening. Sternotomy wires and CABG markers. No focal airspace consolidation, pleural effusion, or pneumothorax. Mild cardiomegaly. Tortuous aorta with aortic atherosclerosis. No acute fracture or destructive lesions. Multilevel thoracic osteophytosis. IMPRESSION: No acute cardiopulmonary abnormality. Electronically Signed   By: Rogelia Myers M.D.   On: 07/11/2024 17:25   CARDIAC CATHETERIZATION Result Date: 07/02/2024   Prox LAD to Mid LAD lesion is 90% stenosed.   Ost LM to Mid LM lesion is 50% stenosed.   Ost Cx to Prox Cx lesion is 90% stenosed.   Ost RCA to Prox RCA lesion is 100% stenosed.   Mid Graft to Dist Graft lesion is 95% stenosed.   Recommend uninterrupted dual antiplatelet therapy with Aspirin  81mg  daily and Clopidogrel  75mg  daily for a minimum of 6 months (stable  ischemic heart disease-Class I recommendation). 1.  Severe native three-vessel CAD 2.  LIMA to LAD and SVG to OM widely patent 3.  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  Successful direct stenting of the distal SVG to PDA with distal protection with a 4.0 x 15 mm drug-eluting stent with intravascular ultrasound guidance 5.  Aspirin  and clopidogrel  for at least 6 months followed by clopidogrel  indefinitely 6.  Continue workup for aortic valve replacement    ECHO 04/2024: 1. Left ventricular ejection fraction, by estimation, is 60 to 65%. The left ventricle has Douglas function. The left ventricle has no regional wall motion abnormalities. The left ventricular internal cavity size was mildly dilated. Left ventricular diastolic parameters are consistent with Grade I diastolic dysfunction (impaired relaxation).   2. Right ventricular systolic function is Douglas. The right ventricular  size is Douglas.   3. The mitral valve is Douglas in structure. No evidence of mitral valve  regurgitation.   4. The aortic valve is Douglas in structure. Aortic valve regurgitation is  trivial. Severe aortic valve stenosis.   TELEMETRY reviewed by me 07/26/2024: sinus rhythm, rate 70s  EKG reviewed by me: sinus tachycardia ST depression, rate 129 bpm  Data reviewed by me 07/26/2024: last 24h vitals tele labs imaging I/O hospitalist progress note, PCCM notes  Principal Problem:   Acute respiratory distress Active Problems:   Hyperlipidemia   Cognitive impairment   Alcohol  abuse   Essential hypertension   CAD (coronary artery disease)   Stroke (HCC)   Iron  deficiency anemia   Acute on chronic diastolic CHF (congestive heart failure) (HCC)   COPD exacerbation (HCC)   Elevated lactic acid level   Lobar pneumonia (HCC)   Metabolic acidosis, increased anion gap   Myocardial injury    ASSESSMENT AND PLAN:  [name] is a 63 y.o. 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 Found to have pneumonia, overnight troponins uptrending. Cardiology was consulted for further evaluation.   # Ventricular Ectopy Per tele patient with bigeminy PVCs, started on IV amio on 07/30 for ventricular ectopy. PVC much improved s/p IV amio. Per tele this AM SR with rate 90s with no more ventricular ectopy -Continue to monitor telemetry closely.  -Monitor and replenish electrolytes for a goal K >4, Mag >2  -Continue Amio 400 mg BID for 10 days, then 200 mg daily.   # Acute hypoxic respiratory failure, mechanical ventilation # Aspiration pneumonia # Alcohol  withdrawal, improving # Acute on chronic HFpEF # Severe aortic stenosis Presented with SOB, cough, imaging concerning for HCAP/COPD exacerbation. LA elevated on presentation at 3.6 > 5.9> 3.4. s/p extubation 08/01. Increased O2 need to 10L, concern for aspiration event on 08/05. NGT removed (08/07). BNP elevated 1500 > 3200 (08/07).  -Continue IV lasix  40 mg BID.  Closely monitor renal function, UOP. -Augment diuresis and help decrease CO2 with IV acetazolamide  500 mg BID. Will likely transition to PO acetazolamide .   (Ordered BMP for 1500 today) -Will resume home dapagliflozin  10 mg daily tomorrow if alkalosis improves.  -Will plan to optimize GDMT as BP allows.  (Home meds: losartan , metoprolol  succinate, spironolactone ) -Further management of respiratory failure/ aspiration PNA as per primary team.  -CIWA protocol given hx of alcohol  abuse. -Originally scheduled to see Dr. Katina 7/30 to discuss aortic valve repair/replacement options. Will reschedule this appointment prior to discharge.  # Demand ischemia # Coronary  artery disease s/p CABG x3 (04/2012) s/p recent stent (07/02/24) # Hypertension # Hyperlipidemia With recent LHC, s/p DES to SVG to PDA 07/02/2024. Reported some CP when he first came to hospital which has now resolved, no recurrence. Troponin trended 44 > 144 > 205 > 980 > 1862 > 5766 > 5756. Patient weaned off levo on 07/31, BP improving and stabilizing. Reports chest tightness with deep breathing, MSK tenderness on palpation, trops repeated (08/07), minimally elevated and flat 72 > 75 is demand ischemia, not ACS is setting of stated above hospital concerns. Repeat EKG (08/07) is without acute ischemic changes.   -Continue home plavix  75 mg daily. Continue aspirin  81 mg daily. Continue DAPT due to recent stent placement on 07/16. -Continue Crestor  20 mg daily for high-intensity statin management.  -Resume home metoprolol  succinate 12.5 mg daily.  -Will plan to resume home losartan  as BP allows.  -Recommend MAP > 65.   # Acute on chronic anemia # Hx AVM Patient received blood transfusion on 07/28, 07/31, 08/04, 08/09. -Closely monitor H&H. -Continue PPIs. -Goal Hgb >8 due to cardiac history. Recommend transfusion when <8.  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 Continue DAPT due to recent stent placement.  -Management per primary team.    This patient's plan of care was discussed and created with Dr. Florencio and he is in agreement.  Signed: Danita Bloch, PA-C  07/26/2024, 7:40 AM [facility]

## 2024-07-26 NOTE — Progress Notes (Signed)
 Patient transferred to room 251 on cardiac monitoring, in stable condition, and with all belongings.

## 2024-07-26 NOTE — Progress Notes (Signed)
 Report given to receiving nurse Verneita, RN

## 2024-07-27 DIAGNOSIS — J189 Pneumonia, unspecified organism: Secondary | ICD-10-CM | POA: Diagnosis not present

## 2024-07-27 DIAGNOSIS — R0603 Acute respiratory distress: Secondary | ICD-10-CM | POA: Diagnosis not present

## 2024-07-27 DIAGNOSIS — J181 Lobar pneumonia, unspecified organism: Secondary | ICD-10-CM | POA: Diagnosis not present

## 2024-07-27 DIAGNOSIS — Z515 Encounter for palliative care: Secondary | ICD-10-CM | POA: Diagnosis not present

## 2024-07-27 DIAGNOSIS — J441 Chronic obstructive pulmonary disease with (acute) exacerbation: Secondary | ICD-10-CM | POA: Diagnosis not present

## 2024-07-27 DIAGNOSIS — R7989 Other specified abnormal findings of blood chemistry: Secondary | ICD-10-CM | POA: Diagnosis not present

## 2024-07-27 LAB — CBC
HCT: 28.4 % — ABNORMAL LOW (ref 39.0–52.0)
Hemoglobin: 8.7 g/dL — ABNORMAL LOW (ref 13.0–17.0)
MCH: 28.7 pg (ref 26.0–34.0)
MCHC: 30.6 g/dL (ref 30.0–36.0)
MCV: 93.7 fL (ref 80.0–100.0)
Platelets: 251 K/uL (ref 150–400)
RBC: 3.03 MIL/uL — ABNORMAL LOW (ref 4.22–5.81)
RDW: 18.5 % — ABNORMAL HIGH (ref 11.5–15.5)
WBC: 13.4 K/uL — ABNORMAL HIGH (ref 4.0–10.5)
nRBC: 0 % (ref 0.0–0.2)

## 2024-07-27 LAB — BASIC METABOLIC PANEL WITH GFR
Anion gap: 14 (ref 5–15)
BUN: 41 mg/dL — ABNORMAL HIGH (ref 8–23)
CO2: 27 mmol/L (ref 22–32)
Calcium: 8.8 mg/dL — ABNORMAL LOW (ref 8.9–10.3)
Chloride: 97 mmol/L — ABNORMAL LOW (ref 98–111)
Creatinine, Ser: 1.39 mg/dL — ABNORMAL HIGH (ref 0.61–1.24)
GFR, Estimated: 56 mL/min — ABNORMAL LOW (ref >60)
Glucose, Bld: 115 mg/dL — ABNORMAL HIGH (ref 70–99)
Potassium: 3.2 mmol/L — ABNORMAL LOW (ref 3.5–5.1)
Sodium: 138 mmol/L (ref 135–145)

## 2024-07-27 LAB — MAGNESIUM: Magnesium: 2 mg/dL (ref 1.7–2.4)

## 2024-07-27 LAB — PROCALCITONIN: Procalcitonin: <0.1 ng/mL

## 2024-07-27 MED ORDER — POTASSIUM CHLORIDE 20 MEQ PO PACK
40.0000 meq | PACK | Freq: Once | ORAL | Status: AC
  Administered 2024-07-27: 40 meq via ORAL
  Filled 2024-07-27: qty 2

## 2024-07-27 MED ORDER — IPRATROPIUM-ALBUTEROL 0.5-2.5 (3) MG/3ML IN SOLN
3.0000 mL | Freq: Three times a day (TID) | RESPIRATORY_TRACT | Status: DC
  Administered 2024-07-27 – 2024-07-31 (×22): 3 mL via RESPIRATORY_TRACT
  Filled 2024-07-27 (×13): qty 3

## 2024-07-27 MED ORDER — FUROSEMIDE 40 MG PO TABS
40.0000 mg | ORAL_TABLET | Freq: Every day | ORAL | Status: DC
  Administered 2024-07-28 (×2): 40 mg via ORAL
  Filled 2024-07-27: qty 1

## 2024-07-27 MED ORDER — DAPAGLIFLOZIN PROPANEDIOL 10 MG PO TABS
10.0000 mg | ORAL_TABLET | Freq: Every day | ORAL | Status: DC
  Administered 2024-07-27 – 2024-07-31 (×8): 10 mg via ORAL
  Filled 2024-07-27 (×5): qty 1

## 2024-07-27 MED ORDER — NICOTINE 7 MG/24HR TD PT24
7.0000 mg | MEDICATED_PATCH | Freq: Every day | TRANSDERMAL | Status: DC
  Administered 2024-07-27 – 2024-07-31 (×8): 7 mg via TRANSDERMAL
  Filled 2024-07-27 (×5): qty 1

## 2024-07-27 MED ORDER — PREDNISONE 20 MG PO TABS
20.0000 mg | ORAL_TABLET | Freq: Every day | ORAL | Status: AC
  Administered 2024-07-28 – 2024-07-30 (×6): 20 mg via ORAL
  Filled 2024-07-27 (×3): qty 1

## 2024-07-27 NOTE — Plan of Care (Signed)

## 2024-07-27 NOTE — Progress Notes (Signed)
 Progress Note    [name]  FMW:[id number] DOB: [birth year]  DOA: 07/11/2024 PCP: Osa Geralds, NP      Brief Narrative:    Medical records reviewed and are as summarized below:  [name] is a 63 y.o. male with medical history significant of  GIB, alcohol  abuse, COPD on 2 L oxygen, hypertension, CAD with prior CABG and recent Stent placement,, severe aortic stenosis, HLD, stroke, dementia, chronic diastolic CHF, who presented to the hospital on 07/11/2024 with SOB, cough productive of yellowish sputum, sided chest pain.   Patient was recently hospitalized from 7/16 - 7/17 for elective LHC. Pt was s/p of successful direct stenting of distal SVG to PDA.  CTA negative for PE, showed patchy infiltration in the right upper lobe.  Patient is admitted to telemetry bed as inpatient.    He was admitted to the hospitalist service for COPD exacerbation and acute hypoxic respiratory failure.  He developed worsening respiratory distress shortly after admission.  He was placed on BiPAP and was transferred to the ICU for further management.  He has a became agitated and this was attributed to acute alcohol  withdrawal syndrome.  He was started on IV Precedex  infusion for this. He developed acute toxic metabolic encephalopathy and worsening respiratory failure likely from aspiration pneumonia.  He required intubation and mechanical ventilation in the ICU.  He became hypotensive with induction meds despite IV fluids and IV Levophed  infusion.  There was brief loss of pulse but ROSC was achieved after 2 minutes of CPR and 1 dose of epinephrine .    Significant Hospital Events: Including procedures, antibiotic start and stop dates in addition to other pertinent events   7/25: admit with respiratory failure 7/26: clinically decompensated, worsening respiratory status initiated on BiPAP 7/27: on nasal cannula, reports anxiety and tremors consistent with EtOH withdrawal.  Received a total of 15  mg/kg of phenobarbital , however he remained agitated requiring low dose precedex  gtt.  7/28: Pt resting in bed no agitation on precedex  gtt @0 .5 mcg/min.  Orders placed to transfuse 1 unit of pRBC's hgb 7.2 goal hgb 8.0.  No signs of active bleeding  7/29: Pt remains encephalopathic with severe DT's requiring precedex  gtt.  NGT placed to initiate tube feeds and administer medications  7/30: Earlier this morning with vomiting, concern for aspiration event as now with increased WOB and increasing FiO2 requirements (up to 4L). Remains encephalopathic on Precedex .  Respiratory status continued to decline, progressed to agonal respirations, Required INTUBATION for airway protection.  Hypotensive with induction meds despite IV fluids and Levophed  resulting in brief loss of pulse (ROSC obtained after 2 minutes of CPR and 1 epi).  Central line placed due to vasopressors. 7/31: Overnight with episode on non-sustained SVT which converted spontaneously, remains on Amiodarone  gtt per Cardiology.  Nursing reported black tarry stools overnight, Hgb remains stable.   Levophed  weaned off, on minimal vent settings, will perform WUA and SBT as mental status allows.  With fever of 101 F overnight and WBC nearly doubled, tracheal aspirate results pending, will check blood cultures and UA, broaden to empiric Vancomycin  and Zosyn  pending culture results.   08/01:  No significant events reported overnight. Received 1 unit pRBC's overnight for Hgb 7.5 with appropriate response to 8.8 and has since remained stable with no reports of bleeding from nursing.  Fevers resolved, WBC improved with broadening to Zosyn  yesterday, cultures currently with no growth.  Weaned off levophed .  On minimal vent support, passed WUA and SBT,  EXTUBATED TO HHFNC.  Diuresed with 20 mg IV Lasix  x1 dose with extubation, needs aggressive pulmonary toilet. 8/6: Patient with worsening respiratory status and oxygen requirement this morning, appears more  congested.  Apparently concern of aspiration event yesterday evening when his NG tube was dislodged.  Ordered repeat chest x-ray and sputum culture, worsening leukocytosis and procalcitonin at 0.26.  Starting on cefepime , Mucomyst  and giving 1 dose of IV Lasix .  Currently on 10 L of oxygen 8/7: Worsening respiratory status and increased work of breathing overnight, patient was transferred back to stepdown and started on heated high flow at 70% FiO2. Tube feed was held.  Chest x-ray with unchanged interstitial opacities bilaterally, possibly persistent interstitial edema and underlying emphysematous changes.  Also shows similar appearing lateral right upper lobe zone nodular opacity.  Patient was started on IV diuresis along with extensive pulmonary toileting.  BNP increased to 3252, troponin at 72 with flat curve, MRSA PCR negative PCCM was reconsulted as patient is high risk for reintubation. Palliative care was also consulted. 8/8: Clinically improving as of improving oxygen requirement, sputum cultures with normal respiratory flora. Saw alert team started on dysphagia 2 diet with thin liquid.  Hemoglobin decreased to 7.7 so ordered another unit of PRBC as goal hemoglobin is above 8 due to recent NSTEMI.  Continuing IV diuresis 8/9: Slowly improving hypoxia, now on 4 L of oxygen, clinically improved volume status with slight increase in creatinine today, cardiology is planning to switch to p.o. Diamox  from tomorrow.  Worsening leukocytosis today and hemoglobin improved to 8.7 after getting 1 more unit of PRBC yesterday. 8/10: Hemodynamically stable, now weaned back to 2 L of oxygen, IV Lasix  has been switched with p.o.  Creatinine 1.39 today.  TOC to look for SNF   Assessment/Plan:   Principal Problem:   Acute respiratory distress Active Problems:   COPD exacerbation (HCC)   Lobar pneumonia (HCC)   Elevated lactic acid level   Metabolic acidosis, increased anion gap   Essential hypertension    Acute on chronic diastolic CHF (congestive heart failure) (HCC)   Hyperlipidemia   Stroke (HCC)   CAD (coronary artery disease)   Iron  deficiency anemia   Alcohol  abuse   Cognitive impairment   Myocardial injury   Nutrition Problem: Inadequate oral intake Etiology: lethargy/confusion  Signs/Symptoms: NPO status   Body mass index is 29.03 kg/m.  (Obesity)   Acute on chronic hypoxic respiratory failure: Improving Patient was able to wean to 2 L of oxygen from heated high flow. Started on IV diuresis-IV Lasix  is being switched with p.o. now. - Continue supplemental oxygen-wean as tolerated S/p extubation on 07/18/2024.  Was intubated on 07/16/2024.  Aspiration pneumonia: Another concern of aspiration yesterday evening with resultant worsening respiratory status. Worsening leukocytosis again.  PCT at 0.26  Completed IV Zosyn  on 07/21/2024. Suspected septic shock: Resolved.  He is off of IV Levophed  infusion.  Repeat chest x-ray with persistent opacities And another slight worsening of leukocytosis today-can be due to steroid. -Cefepime  was discontinued after negative respiratory flora.  Procalcitonin now negative. -Sputum culture-normal respiratory flora-MRSA PCR was negative - Mucomyst  nebulizer due to increased congestion and respiratory secretions. - Aggressive pulmonary toilet - Continue with supportive care  COPD exacerbation: Continue bronchodilators.   Patient was again started on Solu-Medrol  by PCCM. Switching Solu-Medrol  with prednisone  20 mg daily for next 3 days  Acute toxic metabolic encephalopathy: Mental status is improving.  Continue supportive care Alcohol  use disorder with alcohol  withdrawal syndrome: He is  off of IV Precedex  infusion.  Continue IV thiamine .  Brief cardiac arrest due to severe hypotension from induction meds on 07/16/2024 (ROSC achieved after 2 minutes of CPR and 1 dose of epinephrine ).  Acute NSTEMI, elevated troponins (peaked at 5,766), CAD,  recent elective left heart cath s/p DES to SVG to PDA 07/02/2024,  history of CABG x 3 (May 2013): Continue aspirin , Plavix  and rosuvastatin .  Acute on chronic diastolic CHF, severe aortic stenosis:  2D echo on 05/10/2024 showed EF estimated at 60 to 65%, grade 1 diastolic dysfunction, severe aortic valve stenosis. Repeat BNP shows worsening and now at 3252 - Started on twice daily IV diuresis-now switched to p.o. - Daily weight and BNP - Strict intake and output  Chronic anemia, history of GI bleed: S/p transfusion with 4 unit of PRBCs on 07/25/2024, 07/21/2024, 7/28 , 07/17/2024 Hemoglobin stable at 8.7 today- No evidence of overt GI bleeding thus far.   Continue IV Protonix .  He was evaluated by gastroenterologist on 07/13/2024.  GI signed off. 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  Continue enteral nutrition.  Monitor BMP.  AKI: Resolved  Hyperglycemia: Glucose levels are stable.  Hemoglobin A1c was 5.3 on 10/28/2022   Comorbidities include hypertension, hyperlipidemia, history of stroke   Diet Order             DIET DYS 2 Room service appropriate? Yes with Assist; Fluid consistency: Thin  Diet effective now                  Consultants: Physiological scientist  Procedures: Intubated on 07/16/2024 and extubated on 07/18/2024 Left IJ central line placed on 07/16/2024  Medications:    acetaZOLAMIDE  ER  500 mg Oral Q12H   amiodarone   400 mg Oral BID   Followed by   NOREEN ON 07/28/2024] amiodarone   200 mg Oral Daily   ascorbic acid   500 mg Oral Daily   aspirin   81 mg Oral Daily   budesonide  (PULMICORT ) nebulizer solution  0.5 mg Nebulization BID   Chlorhexidine  Gluconate Cloth  6 each Topical Q0600   clopidogrel   75  mg Oral Daily   dapagliflozin  propanediol  10 mg Oral Daily   enoxaparin  (LOVENOX ) injection  40 mg Subcutaneous QHS   feeding supplement  237 mL Oral TID BM   folic acid   1 mg Oral Daily   furosemide   40 mg Oral Daily   ipratropium-albuterol   3 mL Nebulization TID   iron  polysaccharides  150 mg Oral Daily   methylPREDNISolone  (SOLU-MEDROL ) injection  20 mg Intravenous Daily   metoprolol  succinate  12.5 mg Oral Daily   multivitamin with minerals  1 tablet Oral Daily   nicotine   7 mg Transdermal Daily   mouth rinse  15 mL Mouth Rinse 4 times per day   pantoprazole  (PROTONIX ) IV  40 mg Intravenous Q12H   polyethylene glycol  17 g Oral Daily   rosuvastatin   20 mg Oral Daily   thiamine   100 mg Oral Daily   Vitamin D  (Ergocalciferol )  50,000 Units Oral Q7 days   Continuous Infusions:  Anti-infectives (From admission, onward)    Start     Dose/Rate Route Frequency Ordered Stop   07/23/24 1400  ceFEPIme  (MAXIPIME ) 2 g in sodium chloride  0.9 % 100 mL  IVPB  Status:  Discontinued        2 g 200 mL/hr over 30 Minutes Intravenous Every 8 hours 07/23/24 1203 07/24/24 1028   07/17/24 1000  piperacillin -tazobactam (ZOSYN ) IVPB 3.375 g  Status:  Discontinued        3.375 g 12.5 mL/hr over 240 Minutes Intravenous Every 8 hours 07/17/24 0811 07/21/24 1100   07/17/24 1000  vancomycin  (VANCOREADY) IVPB 2000 mg/400 mL  Status:  Discontinued        2,000 mg 200 mL/hr over 120 Minutes Intravenous  Once 07/17/24 0811 07/17/24 1026   07/14/24 1200  cefTRIAXone  (ROCEPHIN ) 2 g in sodium chloride  0.9 % 100 mL IVPB        2 g 200 mL/hr over 30 Minutes Intravenous Every 24 hours 07/14/24 1030 07/16/24 1204   07/12/24 1600  piperacillin -tazobactam (ZOSYN ) IVPB 3.375 g  Status:  Discontinued        3.375 g 12.5 mL/hr over 240 Minutes Intravenous Every 8 hours 07/12/24 1503 07/14/24 1030   07/12/24 0900  vancomycin  (VANCOREADY) IVPB 1250 mg/250 mL  Status:  Discontinued        1,250 mg 166.7 mL/hr over 90  Minutes Intravenous Every 12 hours 07/11/24 2012 07/12/24 1458   07/11/24 2100  ceFEPIme  (MAXIPIME ) 2 g in sodium chloride  0.9 % 100 mL IVPB  Status:  Discontinued        2 g 200 mL/hr over 30 Minutes Intravenous Every 8 hours 07/11/24 1959 07/12/24 1458   07/11/24 2100  vancomycin  (VANCOREADY) IVPB 2000 mg/400 mL        2,000 mg 200 mL/hr over 120 Minutes Intravenous  Once 07/11/24 2004 07/11/24 2255   07/11/24 1945  cefTRIAXone  (ROCEPHIN ) 1 g in sodium chloride  0.9 % 100 mL IVPB  Status:  Discontinued        1 g 200 mL/hr over 30 Minutes Intravenous  Once 07/11/24 1930 07/11/24 1933   07/11/24 1945  azithromycin  (ZITHROMAX ) 500 mg in sodium chloride  0.9 % 250 mL IVPB  Status:  Discontinued        500 mg 250 mL/hr over 60 Minutes Intravenous  Once 07/11/24 1930 07/11/24 1955   07/11/24 1945  cefTRIAXone  (ROCEPHIN ) 2 g in sodium chloride  0.9 % 100 mL IVPB  Status:  Discontinued        2 g 200 mL/hr over 30 Minutes Intravenous  Once 07/11/24 1933 07/11/24 1955       Family Communication/Anticipated D/C date and plan/Code Status    DVT prophylaxis: Lovenox      Code Status: Full Code  Family Communication: Discussed with patient  Disposition Plan: Plan to discharge to SNF   Status is: Inpatient Remains inpatient appropriate because: Acute respiratory failure, aspiration pneumonia   Subjective:  Patient was seen and examined today.  Still having some cough and lower rib cage pain with coughing.  Objective:    Vitals:   07/27/24 0325 07/27/24 0500 07/27/24 0813 07/27/24 0840  BP: 124/69  (!) 107/59   Pulse: 75  81   Resp: 18  18   Temp: 97.6 F (36.4 C)  98.2 F (36.8 C)   TempSrc: Oral  Oral   SpO2: 100%  98% 95%  Weight:  97.1 kg    Height:       No data found.   Intake/Output Summary (Last 24 hours) at 07/27/2024 1127 Last data filed at 07/27/2024 1000 Gross per 24 hour  Intake 1080 ml  Output 3600 ml  Net -2520 ml  Filed Weights   07/24/24 0500  07/25/24 0500 07/27/24 0500  Weight: 101.8 kg 101.8 kg 97.1 kg    Exam: General.  Ill-appearing, frail gentleman, in no acute distress. Pulmonary.  Very scant wheeze bilaterally, normal respiratory effort. CV.  Regular rate and rhythm, no JVD, rub or murmur. Abdomen.  Soft, nontender, nondistended, BS positive. CNS.  Alert and oriented .  No focal neurologic deficit. Extremities.  No edema,  pulses intact and symmetrical. Psychiatry.  Judgment and insight appears normal.      Data Reviewed:   I have personally reviewed following labs and imaging studies:  Labs: Labs show the following:   Basic Metabolic Panel: Recent Labs  Lab 07/21/24 0455 07/22/24 0416 07/23/24 0457 07/24/24 0406 07/25/24 0439 07/25/24 1334 07/26/24 0426 07/26/24 1353 07/27/24 0536  NA 145   < > 142 141 144 140 137 135 138  K 4.0   < > 4.1 4.3 4.8 4.2 3.7 3.6 3.2*  CL 105   < > 101 99 99 95* 94* 95* 97*  CO2 33*   < > 32 33* 34* 31 32 30 27  GLUCOSE 193*   < > 138* 160* 125* 117* 170* 166* 115*  BUN 33*   < > 37* 48* 54* 55* 53* 49* 41*  CREATININE 1.15   < > 1.12 1.05 1.15 1.20 1.38* 1.27* 1.39*  CALCIUM  8.4*   < > 8.6* 8.7* 8.5* 8.5* 8.6* 8.5* 8.8*  MG  --   --  2.7*  --  2.9*  --  2.4  --  2.0  PHOS 3.4  --  4.6 4.9* 5.0*  --  4.0  --   --    < > = values in this interval not displayed.   GFR Estimated Creatinine Clearance: 64 mL/min (A) (by C-G formula based on SCr of 1.39 mg/dL (H)). Liver Function Tests: Recent Labs  Lab 07/21/24 0455 07/24/24 0406  ALBUMIN  2.7* 2.9*   No results for input(s): LIPASE, AMYLASE in the last 168 hours. No results for input(s): AMMONIA in the last 168 hours.  Coagulation profile No results for input(s): INR, PROTIME in the last 168 hours.  CBC: Recent Labs  Lab 07/22/24 0416 07/23/24 0457 07/24/24 0406 07/25/24 0439 07/26/24 0426  WBC 14.0* 18.8* 15.7* 10.5 16.1*  HGB 8.6* 9.3* 8.1* 7.7* 8.7*  HCT 28.3* 31.0* 27.5* 26.2* 28.3*  MCV  91.6 92.5 93.9 95.6 93.4  PLT 247 247 246 231 274   Cardiac Enzymes: No results for input(s): CKTOTAL, CKMB, CKMBINDEX, TROPONINI in the last 168 hours. BNP (last 3 results) No results for input(s): PROBNP in the last 8760 hours. CBG: Recent Labs  Lab 07/25/24 0813 07/25/24 1149 07/25/24 1615 07/26/24 0738 07/26/24 1254  GLUCAP 102* 127* 148* 128* 158*   D-Dimer: No results for input(s): DDIMER in the last 72 hours. Hgb A1c: No results for input(s): HGBA1C in the last 72 hours. Lipid Profile: No results for input(s): CHOL, HDL, LDLCALC, TRIG, CHOLHDL, LDLDIRECT in the last 72 hours.  Thyroid function studies: No results for input(s): TSH, T4TOTAL, T3FREE, THYROIDAB in the last 72 hours.  Invalid input(s): FREET3 Anemia work up: No results for input(s): VITAMINB12, FOLATE, FERRITIN, TIBC, IRON , RETICCTPCT in the last 72 hours. Sepsis Labs: Recent Labs  Lab 07/23/24 0457 07/24/24 0406 07/25/24 0439 07/26/24 0426  PROCALCITON 0.26  --   --   --   WBC 18.8* 15.7* 10.5 16.1*    Microbiology Recent Results (from the past 240 hours)  Culture, Respiratory w Gram Stain     Status: None   Collection Time: 07/23/24 11:32 AM   Specimen: Sputum  Result Value Ref Range Status   Specimen Description   Final    SPU Performed at [facility], [address], [place], [place] [address]    Special Requests   Final    NONE Performed at [facility], [address] [place], [place] [address]    Gram Stain   Final    RARE WBC PRESENT, PREDOMINANTLY PMN RARE GRAM POSITIVE COCCI    Culture   Final    Normal respiratory flora-no Staph aureus or Pseudomonas seen Performed at [facility], [address] [place], [place] [address]    Report Status 07/25/2024 FINAL  Final  MRSA Next Gen by PCR, Nasal     Status: None   Collection Time: 07/24/24 11:08 AM   Specimen: Nasal Mucosa; Nasal Swab  Result Value  Ref Range Status   MRSA by PCR Next Gen NOT DETECTED NOT DETECTED Final    Comment: (NOTE) The GeneXpert MRSA Assay (FDA approved for NASAL specimens only), is one component of a comprehensive MRSA colonization surveillance program. It is not intended to diagnose MRSA infection nor to guide or monitor treatment for MRSA infections. Test performance is not FDA approved in patients less than 53 years old. Performed at [facility], [address] [place], [place] [address]     Procedures and diagnostic studies:  No results found.    LOS: 16 days   Amaryllis Dare MD  [facility]   Pager on [email or web address]. If 7PM-7AM, please contact night-coverage at [email or web address]  07/27/2024, 11:27 AM

## 2024-07-27 NOTE — Plan of Care (Signed)
  Problem: Education: Goal: Knowledge of General Education information will improve Description: Including pain rating scale, medication(s)/side effects and non-pharmacologic comfort measures Outcome: Progressing   Problem: Health Behavior/Discharge Planning: Goal: Ability to manage health-related needs will improve Outcome: Progressing   Problem: Clinical Measurements: Goal: Ability to maintain clinical measurements within normal limits will improve Outcome: Progressing Goal: Will remain free from infection Outcome: Progressing Goal: Diagnostic test results will improve Outcome: Progressing Goal: Respiratory complications will improve Outcome: Progressing Goal: Cardiovascular complication will be avoided Outcome: Progressing   Problem: Activity: Goal: Risk for activity intolerance will decrease Outcome: Progressing   Problem: Nutrition: Goal: Adequate nutrition will be maintained Outcome: Progressing   Problem: Coping: Goal: Level of anxiety will decrease Outcome: Progressing   Problem: Elimination: Goal: Will not experience complications related to bowel motility Outcome: Progressing Goal: Will not experience complications related to urinary retention Outcome: Progressing   Problem: Pain Managment: Goal: General experience of comfort will improve and/or be controlled Outcome: Progressing   Problem: Safety: Goal: Ability to remain free from injury will improve Outcome: Progressing   Problem: Skin Integrity: Goal: Risk for impaired skin integrity will decrease Outcome: Progressing   Problem: Education: Goal: Knowledge of disease or condition will improve Outcome: Progressing Goal: Knowledge of the prescribed therapeutic regimen will improve Outcome: Progressing Goal: Individualized Educational Video(s) Outcome: Progressing   Problem: Activity: Goal: Ability to tolerate increased activity will improve Outcome: Progressing Goal: Will verbalize the  importance of balancing activity with adequate rest periods Outcome: Progressing   Problem: Respiratory: Goal: Ability to maintain a clear airway will improve Outcome: Progressing Goal: Levels of oxygenation will improve Outcome: Progressing Goal: Ability to maintain adequate ventilation will improve Outcome: Progressing   Problem: Activity: Goal: Ability to tolerate increased activity will improve Outcome: Progressing   Problem: Clinical Measurements: Goal: Ability to maintain a body temperature in the normal range will improve Outcome: Progressing   Problem: Respiratory: Goal: Ability to maintain adequate ventilation will improve Outcome: Progressing Goal: Ability to maintain a clear airway will improve Outcome: Progressing   Problem: Activity: Goal: Ability to tolerate increased activity will improve Outcome: Progressing   Problem: Respiratory: Goal: Ability to maintain a clear airway and adequate ventilation will improve Outcome: Progressing   Problem: Role Relationship: Goal: Method of communication will improve Outcome: Progressing

## 2024-07-27 NOTE — Progress Notes (Signed)
 This patient continues confused to place and situation. He has repeatedly, 6 times this shift, gotten to the side of the bed, feet on the floor to leave the hospital to go shopping, go to the kitchen to get water , go outside to see where the flashing lights are coming from. I have reoriented him each time. He is not able to say the hospital he is in or the reason why he is still in the hospital.

## 2024-07-27 NOTE — Progress Notes (Signed)
 [facility] CLINIC CARDIOLOGY PROGRESS NOTE       Patient ID: [name] MRN: [id number] DOB/AGE: 03-19-1961 63 y.o.  Admit date: 07/11/2024 Referring Physician Dr. Charlie Sellar Primary Physician Osa Geralds, NP  Primary Cardiologist Tinnie Maiden, NP  Reason for Consultation elevated troponins  HPI: [name] is a 63 y.o. 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 Found to have pneumonia, overnight troponins uptrending. Cardiology was consulted for further evaluation.   Interval history: -Patient seen and examined this AM, sitting upright on side of hospital bed. Denies CP, palpitations. Reports SOB is stable.  -Wheezing on exam, receiving breathing treatment. -Patient BP and HR stable this AM. No significant events on telemetry.  -Slight bump in Cr today but with great UOP yesterday.   Review of systems complete and found to be negative unless listed above    Past Medical History:  Diagnosis Date   COPD (chronic obstructive pulmonary disease) (HCC)    CVA (cerebral infarction)    Headache    mild, since stroke 2012   Hypertension    MI (myocardial infarction) (HCC) 05/09/2012   Reading difficulty    pt reports he reads at about a second grade level   Stroke [facility]) 2012   numbness to left hand    Wears dentures    full upper and lower    Past Surgical History:  Procedure Laterality Date   CAROTID ENDARTERECTOMY Left    2012 or 2013   CHOLECYSTECTOMY N/A 07/26/2016   Procedure: LAPAROSCOPIC CHOLECYSTECTOMY;  Surgeon: Charlie FORBES Fell, MD;  Location: [facility] ORS;  Service: General;  Laterality: N/A;   COLONOSCOPY N/A 05/08/2024    Procedure: COLONOSCOPY;  Surgeon: Jinny Carmine, MD;  Location: [facility] ENDOSCOPY;  Service: Endoscopy;  Laterality: N/A;   COLONOSCOPY WITH PROPOFOL  N/A 02/09/2022   Procedure: COLONOSCOPY WITH PROPOFOL ;  Surgeon: Jinny Carmine, MD;  Location: [facility] ENDOSCOPY;  Service: Endoscopy;  Laterality: N/A;   CORONARY ARTERY BYPASS GRAFT  05/10/2012   3 vessel   CORONARY STENT INTERVENTION N/A 07/02/2024   Procedure: CORONARY STENT INTERVENTION;  Surgeon: Katina Albright, MD;  Location: [facility] INVASIVE CV LAB;  Service: Cardiovascular;  Laterality: N/A;   CORONARY ULTRASOUND/IVUS N/A 07/02/2024   Procedure: Coronary Ultrasound/IVUS;  Surgeon: Katina Albright, MD;  Location: [facility] INVASIVE CV LAB;  Service: Cardiovascular;  Laterality: N/A;   ENTEROSCOPY N/A 05/16/2023   Procedure: ENTEROSCOPY;  Surgeon: Therisa Bi, MD;  Location: [facility] ENDOSCOPY;  Service: Gastroenterology;  Laterality: N/A;   ENTEROSCOPY N/A 06/01/2023   Procedure: ENTEROSCOPY;  Surgeon: Unk Corinn Skiff, MD;  Location: [facility] ENDOSCOPY;  Service: Gastroenterology;  Laterality: N/A;   ESOPHAGOGASTRODUODENOSCOPY  05/13/2023   Procedure: ESOPHAGOGASTRODUODENOSCOPY (EGD);  Surgeon: Jinny Carmine, MD;  Location: [facility] ENDOSCOPY;  Service: Endoscopy;;   ESOPHAGOGASTRODUODENOSCOPY (EGD) WITH PROPOFOL  N/A 10/28/2022   Procedure: ESOPHAGOGASTRODUODENOSCOPY (EGD) WITH PROPOFOL ;  Surgeon: Onita Elspeth Sharper, DO;  Location: [facility] ENDOSCOPY;  Service: Gastroenterology;  Laterality: N/A;   GIVENS CAPSULE STUDY  05/13/2023   Procedure: GIVENS CAPSULE STUDY;  Surgeon: Jinny Carmine, MD;  Location: [facility] ENDOSCOPY;  Service: Endoscopy;;   GIVENS CAPSULE STUDY  06/01/2023   Procedure: GIVENS CAPSULE STUDY;  Surgeon: Unk Corinn  Jess, MD;  Location: [facility] ENDOSCOPY;  Service: Gastroenterology;;   LEFT HEART CATH AND CORS/GRAFTS ANGIOGRAPHY N/A 07/02/2024   Procedure: LEFT HEART CATH AND CORS/GRAFTS ANGIOGRAPHY;  Surgeon: Katina Albright, MD;  Location: [facility] INVASIVE CV LAB;   Service: Cardiovascular;  Laterality: N/A;    Medications Prior to Admission  Medication Sig Dispense Refill Last Dose/Taking   albuterol  (VENTOLIN  HFA) 108 (90 Base) MCG/ACT inhaler Inhale 2 puffs into the lungs every 6 (six) hours as needed for wheezing or shortness of breath. 6.7 g 2 07/11/2024 Morning   ascorbic acid  (VITAMIN C ) 500 MG tablet Take 1 tablet (500 mg total) by mouth daily. 30 tablet 2 07/11/2024 Morning   aspirin  EC 81 MG tablet Take 1 tablet (81 mg total) by mouth daily. Swallow whole. 30 tablet 5 07/11/2024 Morning   clopidogrel  (PLAVIX ) 75 MG tablet Take 1 tablet (75 mg total) by mouth daily with breakfast. 30 tablet 11 07/11/2024 Morning   dapagliflozin  propanediol (FARXIGA ) 10 MG TABS tablet Take 1 tablet (10 mg total) by mouth daily. 30 tablet 11 07/11/2024 Morning   folic acid  (FOLVITE ) 1 MG tablet Take 1 tablet (1 mg total) by mouth daily. 30 tablet 2 07/11/2024   iron  polysaccharides (NIFEREX) 150 MG capsule Take 1 capsule (150 mg total) by mouth daily. 30 capsule 2 07/11/2024   isosorbide  mononitrate (IMDUR ) 60 MG 24 hr tablet Take 1 tablet (60 mg total) by mouth daily. 30 tablet 11 07/11/2024   losartan  (COZAAR ) 25 MG tablet Take 0.5 tablets (12.5 mg total) by mouth daily. 30 tablet 11 07/11/2024 Morning   metoprolol  succinate (TOPROL -XL) 25 MG 24 hr tablet Take 0.5 tablets (12.5 mg total) by mouth daily. 30 tablet 11 07/11/2024 Morning   pantoprazole  (PROTONIX ) 40 MG tablet Take 1 tablet (40 mg total) by mouth daily. 30 tablet 2 07/11/2024 Morning   pravastatin  (PRAVACHOL ) 20 MG tablet Take 1 tablet (20 mg total) by mouth at bedtime. 30 tablet 11 07/10/2024 Evening   spironolactone  (ALDACTONE ) 25 MG tablet Take 0.5 tablets (12.5 mg total) by mouth daily. 30 tablet 11 07/11/2024 Morning   torsemide  (DEMADEX ) 10 MG tablet Take 1 tablet (10 mg total) by mouth daily. 30 tablet 11 07/11/2024   Social History   Socioeconomic History   Marital status: Divorced    Spouse name: Not on  file   Number of children: Not on file   Years of education: Not on file   Highest education level: Not on file  Occupational History   Not on file  Tobacco Use   Smoking status: Former    Current packs/day: 0.00    Types: Cigarettes    Quit date: 81    Years since quitting: 32.6   Smokeless tobacco: Current    Types: Snuff   Tobacco comments:    changed to dip  Vaping Use   Vaping status: Never Used  Substance and Sexual Activity   Alcohol  use: Yes    Alcohol /week: 26.0 standard drinks of alcohol     Types: 26 Cans of beer per week   Drug use: No   Sexual activity: Yes    Birth control/protection: None  Other Topics Concern   Not on file  Social History Narrative   Not on file   Social Drivers of Health   Financial Resource Strain: Not on file  Food Insecurity: No Food Insecurity (07/12/2024)   Hunger Vital Sign    Worried About Running Out of Food in the Last Year: Never true  Ran Out of Food in the Last Year: Never true  Transportation Needs: No Transportation Needs (07/12/2024)   PRAPARE - Administrator, Civil Service (Medical): No    Lack of Transportation (Non-Medical): No  Physical Activity: Not on file  Stress: Not on file  Social Connections: Socially Isolated (07/12/2024)   Social Connection and Isolation Panel    Frequency of Communication with Friends and Family: Once a week    Frequency of Social Gatherings with Friends and Family: Once a week    Attends Religious Services: Never    Database administrator or Organizations: No    Attends Banker Meetings: Never    Marital Status: Divorced  Catering manager Violence: Not At Risk (07/12/2024)   Humiliation, Afraid, Rape, and Kick questionnaire    Fear of Current or Ex-Partner: No    Emotionally Abused: No    Physically Abused: No    Sexually Abused: No    Family History  Problem Relation Age of Onset   Pneumonia Mother      Vitals:   07/27/24 0325 07/27/24 0500 07/27/24  0813 07/27/24 0840  BP: 124/69  (!) 107/59   Pulse: 75  81   Resp: 18  18   Temp: 97.6 F (36.4 C)  98.2 F (36.8 C)   TempSrc: Oral  Oral   SpO2: 100%  98% 95%  Weight:  97.1 kg    Height:        PHYSICAL EXAM General: Ill appearing male, no acute distress. HEENT: Normocephalic and atraumatic. Neck: No JVD.  Lungs: Normal respiratory effort.  Diminished breath sounds with bibasilar crackles. Heart: HRRR, Normal S1 and S2 without gallops. +systolic ejection murmur.   Abdomen: Non-distended appearing.  Msk: Normal strength and tone for age. Extremities: Warm and well perfused. No clubbing, cyanosis, No edema.   Labs: Basic Metabolic Panel: Recent Labs    07/25/24 0439 07/25/24 1334 07/26/24 0426 07/26/24 1353 07/27/24 0536  NA 144   < > 137 135 138  K 4.8   < > 3.7 3.6 3.2*  CL 99   < > 94* 95* 97*  CO2 34*   < > 32 30 27  GLUCOSE 125*   < > 170* 166* 115*  BUN 54*   < > 53* 49* 41*  CREATININE 1.15   < > 1.38* 1.27* 1.39*  CALCIUM  8.5*   < > 8.6* 8.5* 8.8*  MG 2.9*  --  2.4  --  2.0  PHOS 5.0*  --  4.0  --   --    < > = values in this interval not displayed.   Liver Function Tests: No results for input(s): AST, ALT, ALKPHOS, BILITOT, PROT, ALBUMIN  in the last 72 hours.  No results for input(s): LIPASE, AMYLASE in the last 72 hours. CBC: Recent Labs    07/25/24 0439 07/26/24 0426  WBC 10.5 16.1*  HGB 7.7* 8.7*  HCT 26.2* 28.3*  MCV 95.6 93.4  PLT 231 274   Cardiac Enzymes: Recent Labs    07/24/24 0922  TROPONINIHS 68*     BNP: No results for input(s): BNP in the last 72 hours.   D-Dimer: No results for input(s): DDIMER in the last 72 hours. Hemoglobin A1C: No results for input(s): HGBA1C in the last 72 hours. Fasting Lipid Panel: No results for input(s): CHOL, HDL, LDLCALC, TRIG, CHOLHDL, LDLDIRECT in the last 72 hours.  Thyroid Function Tests: No results for input(s): TSH, T4TOTAL, T3FREE,  THYROIDAB  in the last 72 hours.  Invalid input(s): FREET3 Anemia Panel: No results for input(s): VITAMINB12, FOLATE, FERRITIN, TIBC, IRON , RETICCTPCT in the last 72 hours.   Radiology: DG Chest 1 View Result Date: 07/24/2024 CLINICAL DATA:  [id number] Dyspnea [id number] EXAM: CHEST  1 VIEW COMPARISON:  July 23, 2024, July 11, 2024 FINDINGS: Esophagogastric tube terminates below the diaphragm, outside the field of view. Diffuse interstitial opacities throughout both lungs. No lobar consolidation or pneumothorax. Lateral right upper lobe nodular opacity measures 1.9 cm, corresponding to the lung nodule on the prior chest CT. Moderate cardiomegaly. Sternotomy wires and CABG markers. No acute fracture or destructive lesions. Multilevel thoracic osteophytosis. IMPRESSION: 1. Moderate cardiomegaly. Unchanged interstitial opacities throughout both lungs, possibly persistent interstitial edema and underlying emphysematous changes. 2. Similarly appearing lateral right upper lung zone nodular opacity. Please see the comparison chest CT for further characterization. Electronically Signed   By: Rogelia Myers M.D.   On: 07/24/2024 08:43   DG Chest Port 1 View Result Date: 07/23/2024 CLINICAL DATA:  Shortness of breath. EXAM: PORTABLE CHEST 1 VIEW COMPARISON:  July 22, 2024. FINDINGS: The heart size and mediastinal contours are within normal limits. Status post coronary bypass graft. Mild diffuse interstitial densities are noted concerning for possible pulmonary edema with possible small pleural effusions. Nasogastric tube is seen entering stomach. The visualized skeletal structures are unremarkable. IMPRESSION: Mild diffuse interstitial densities are noted concerning for possible pulmonary edema with small pleural effusions. Electronically Signed   By: Lynwood Landy Raddle M.D.   On: 07/23/2024 13:03   DG ABD ACUTE 2+V W 1V CHEST Result Date: 07/22/2024 CLINICAL DATA:  [id number] Dyspnea [id number] [phone] Encounter for  imaging study to confirm nasogastric (NG) tube placement [id number] EXAM: DG ABDOMEN ACUTE WITH 1 VIEW CHEST COMPARISON:  Chest x-ray 07/19/2024 FINDINGS: Enteric tube courses below the diaphragm with tip and side port overlying the expected region of the gastric lumen. The heart and mediastinal contours are within normal limits. No focal consolidation. Chronic coarsened interstitial markings with no overt pulmonary edema. Bilateral trace pleural effusion. No pneumothorax. Right upper quadrant surgical clips. There is no evidence of dilated bowel loops or free intraperitoneal air. No radiopaque calculi or other significant radiographic abnormality is seen. No acute osseous abnormality.  Intact sternotomy wires. IMPRESSION: 1. Enteric tube in good position. 2. Bowel trace pleural effusions. 3. Nonobstructive bowel gas pattern. Electronically Signed   By: Morgane  Naveau M.D.   On: 07/22/2024 14:18   DG Chest Port 1 View Result Date: 07/19/2024 CLINICAL DATA:  Dyspnea EXAM: PORTABLE CHEST 1 VIEW COMPARISON:  07/18/2024 FINDINGS: Two frontal views of the chest demonstrate an enteric catheter passing below diaphragm, tip excluded by collimation. Left internal jugular catheter tip overlies superior vena cava unchanged. 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 The cardiac silhouette is stable. Mild central vascular congestion, with a trace right pleural effusion unchanged since prior exam. The nodular density projecting over the right upper lateral chest on prior study is less well seen on this exam. This may be due to positioning. No pneumothorax. No acute bony abnormalities. IMPRESSION: 1. Pulmonary vascular congestion and small right pleural effusion, unchanged since prior study. 2. The nodular consolidation within the right upper lateral chest seen on prior x-ray and CT is not well seen on this exam, likely due to patient positioning. Electronically Signed   By: Ozell Daring M.D.   On: 07/19/2024 17:34   DG Chest Port  1 View Result Date: 07/18/2024 EXAM: 1 VIEW XRAY OF  THE CHEST 07/18/2024 04:59:06 AM COMPARISON: 07/16/2024 CLINICAL HISTORY: [id number] Acute respiratory failure with hypoxia (HCC) 427266. Acute respiratory failure with hypoxia FINDINGS: LUNGS AND PLEURA: Nodular opacity within the periphery of the right upper lobe appears similar to CT from 07/11/2024. Please refer to that report for follow-up recommendations. Mild asymmetric hazy opacification over the right mid and right lower lung may be related to positional artifact versus small posterior layering effusion. Mild subsegmental atelectasis in the left base. HEART AND MEDIASTINUM: Stable cardiomediastinal contours. Previous median sternotomy and CABG procedure. BONES AND SOFT TISSUES: Aortic atherosclerotic calcification. Left IJ catheter is identified with the tip in the projection of the SVC. There is an enteric tube which courses below the field of view. ET tube tip is stable above the carina. IMPRESSION: 1. Stable nodular opacity within the periphery of the right upper lobe, similar to CT from 07/11/2024. Please refer to that report for follow-up recommendations. 2. Mild asymmetric hazy opacification over the right mid and right lower lung, possibly related to rotational artifact versus small posterior layering effusion. 3. Mild subsegmental atelectasis in the left base. Electronically signed by: Waddell Calk MD 07/18/2024 06:51 AM EDT RP Workstation: [id number]   DG Chest Port 1 View Result Date: 07/16/2024 CLINICAL DATA:  Endotracheal tube and central line placement. EXAM: PORTABLE CHEST 1 VIEW COMPARISON:  07/16/2024 at 8:58 a.m. FINDINGS: Patient is rotated to the right. Endotracheal tube has tip proximally 13.5 cm above the carina. This could be advanced approximately 6 cm. Left IJ central venous catheter has tip over the SVC. Enteric tube courses into the region of the stomach and off the image as tip is not visualized. Lungs are adequately inflated  with possible mild hazy opacification over the right upper lung abutting the minor fissure which may be due to atelectasis or infection. Left lung is clear. Cardiomediastinal silhouette and remainder of the exam is unchanged. IMPRESSION: 1. Endotracheal tube has tip proximally 13.5 cm above the carina. This could be advanced approximately 6 cm. 2. Left IJ central venous catheter with tip over the SVC. 3. Possible mild hazy opacification over the right upper lung abutting the minor fissure which may be due to atelectasis or infection. Electronically Signed   By: Toribio Agreste M.D.   On: 07/16/2024 10:53   DG Chest Port 1 View Result Date: 07/16/2024 CLINICAL DATA:  [id number] Aspiration into airway [id number]. EXAM: PORTABLE CHEST 1 VIEW COMPARISON:  06/23/2024. FINDINGS: Bilateral lungs appear hyperlucent with coarse bronchovascular markings, in keeping with COPD. Bilateral lungs otherwise appear clear. No dense consolidation or lung collapse. Bilateral costophrenic angles are clear. Stable cardio-mediastinal silhouette. There are surgical staples along the heart border and sternotomy wires, status post CABG (coronary artery bypass graft). No acute osseous abnormalities. The soft tissues are within normal limits. Enteric tube is seen coursing below the left hemidiaphragm however, the tip is not included on the film. IMPRESSION: No active disease. COPD. Electronically Signed   By: Ree Molt M.D.   On: 07/16/2024 09:09   DG Abd 1 View Result Date: 07/16/2024 CLINICAL DATA:  Vomiting.  NG tube placement. EXAM: ABDOMEN - 1 VIEW COMPARISON:  07/15/2024 FINDINGS: Nasogastric tube has tip and side-port over the stomach in the upper abdomen just left of midline. Visualized lower thorax unchanged. Abdominopelvic images demonstrate a non obstructive bowel gas pattern. Mild fecal retention throughout the colon. Surgical clips over the right upper quadrant. There are degenerative changes of the spine and hips. Calcified  plaque over the abdominal aorta  and iliac arteries. IMPRESSION: 1. Nasogastric tube with tip and side-port over the stomach. 2. Nonobstructive bowel gas pattern. Mild fecal retention. 3. Aortic atherosclerosis. Electronically Signed   By: Toribio Agreste M.D.   On: 07/16/2024 07:51   DG Abd 1 View Result Date: 07/15/2024 CLINICAL DATA:  Orogastric tube placement. EXAM: ABDOMEN - 1 VIEW COMPARISON:  June 29, 2023. FINDINGS: Distal tip of nasogastric tube is seen in expected position of proximal stomach. IMPRESSION: Nasogastric tube tip seen in expected position of proximal stomach. Electronically Signed   By: Lynwood Landy Raddle M.D.   On: 07/15/2024 10:25   DG Chest Port 1 View Result Date: 07/13/2024 CLINICAL DATA:  Respiratory failure with hypoxia. EXAM: PORTABLE CHEST 1 VIEW COMPARISON:  Multiple previous chest x-rays and recent chest CT. FINDINGS: Stable surgical changes from bypass surgery. The cardiac silhouette, mediastinal and hilar contours are within normal limits and stable. Underlying emphysematous changes and pulmonary scarring. No pleural effusions or discrete pulmonary infiltrates. No pneumothorax. IMPRESSION: Emphysematous changes and pulmonary scarring but no acute overlying pulmonary process. Electronically Signed   By: MYRTIS Stammer M.D.   On: 07/13/2024 16:08   US  Abdomen Limited RUQ (LIVER/GB) Result Date: 07/13/2024 CLINICAL DATA:  Portal hypertension. EXAM: ULTRASOUND ABDOMEN LIMITED RIGHT UPPER QUADRANT COMPARISON:  Abdominal ultrasound 10/19/2022 FINDINGS: Gallbladder: Surgically absent. Common bile duct: Diameter: 3.3 mm Liver: No focal lesion identified. Parenchymal echogenicity is increased and diffusely heterogeneous. There is lobular liver contour. Portal vein is patent on color Doppler imaging with normal direction of blood flow towards the liver. Other: None. IMPRESSION: Lobular liver contour with increased echogenicity and heterogeneous parenchyma consistent with cirrhosis. No  focal liver lesion identified. Electronically Signed   By: Greig Pique M.D.   On: 07/13/2024 16:03   DG Chest Port 1 View Result Date: 07/12/2024 CLINICAL DATA:  Shortness of breath. EXAM: PORTABLE CHEST 1 VIEW COMPARISON:  07/11/2024 FINDINGS: The cardio pericardial silhouette is enlarged. There is pulmonary vascular congestion without overt pulmonary edema. Trace bilateral pleural effusions. Old posterior left sixth rib fracture. Telemetry leads overlie the chest. IMPRESSION: Enlargement of the cardiopericardial silhouette with new pulmonary vascular congestion and trace bilateral pleural effusions. Electronically Signed   By: Camellia Candle M.D.   On: 07/12/2024 12:11   CT Angio Chest PE W/Cm &/Or Wo Cm Result Date: 07/11/2024 CLINICAL DATA:  High probability for PE. Chest pain with shortness of breath EXAM: CT ANGIOGRAPHY CHEST WITH CONTRAST TECHNIQUE: Multidetector CT imaging of the chest was performed using the standard protocol during bolus administration of intravenous contrast. Multiplanar CT image reconstructions and MIPs were obtained to evaluate the vascular anatomy. RADIATION DOSE REDUCTION: This exam was performed according to the departmental dose-optimization program which includes automated exposure control, adjustment of the mA and/or kV according to patient size and/or use of iterative reconstruction technique. CONTRAST:  75mL OMNIPAQUE  IOHEXOL  350 MG/ML SOLN COMPARISON:  CT angiogram chest 05/09/2024 FINDINGS: Cardiovascular: Satisfactory opacification of the pulmonary arteries to the segmental level. No evidence of pulmonary embolism. Patient is status post cardiac surgery. The heart is enlarged. There is no pericardial effusion. There are atherosclerotic calcifications of the aorta and coronary arteries. Aorta is normal in size. Mediastinum/Nodes: There are enlarged right hilar lymph nodes measuring up to 11 mm. Enlarged paratracheal lymph nodes measure up to 10 mm. Visualized thyroid  gland and esophagus are within normal limits. Lungs/Pleura: Moderate emphysema again seen. There is stable scarring in the right lung apex. There is new patchy slightly nodular airspace  disease in the right upper lobe measuring 2.0 by 1.1 cm. There is right-sided central peribronchial wall thickening. Previously identified airspace disease in the left lower lobe has resolved. There is no pleural effusion or pneumothorax. There is a stable left lower lobe pulmonary nodule image 6/57. Upper Abdomen: No acute abnormality. Musculoskeletal: No chest wall abnormality. No acute or significant osseous findings. Review of the MIP images confirms the above findings. IMPRESSION: 1. No evidence for pulmonary embolism. 2. New patchy slightly nodular airspace disease in the right upper lobe worrisome for pneumonia. Follow-up CT recommended in 4-6 weeks recommended to ensure resolution and to exclude underlying nodule. 3. Right-sided central peribronchial wall thickening worrisome for bronchitis. 4. Right hilar and mediastinal lymphadenopathy, likely reactive. 5. Stable left lower lobe pulmonary nodule. 6. Cardiomegaly. Aortic Atherosclerosis (ICD10-I70.0) and Emphysema (ICD10-J43.9). Electronically Signed   By: Greig Pique M.D.   On: 07/11/2024 18:54   DG Chest Portable 1 View Result Date: 07/11/2024 CLINICAL DATA:  SOB EXAM: PORTABLE CHEST - 1 VIEW COMPARISON:  June 24, 2024 FINDINGS: Biapical pleural thickening. Sternotomy wires and CABG markers. No focal airspace consolidation, pleural effusion, or pneumothorax. Mild cardiomegaly. Tortuous aorta with aortic atherosclerosis. No acute fracture or destructive lesions. Multilevel thoracic osteophytosis. IMPRESSION: No acute cardiopulmonary abnormality. Electronically Signed   By: Rogelia Myers M.D.   On: 07/11/2024 17:25   CARDIAC CATHETERIZATION Result Date: 07/02/2024   Prox LAD to Mid LAD lesion is 90% stenosed.   Ost LM to Mid LM lesion is 50% stenosed.   Ost Cx to  Prox Cx lesion is 90% stenosed.   Ost RCA to Prox RCA lesion is 100% stenosed.   Mid Graft to Dist Graft lesion is 95% stenosed.   Recommend uninterrupted dual antiplatelet therapy with Aspirin  81mg  daily and Clopidogrel  75mg  daily for a minimum of 6 months (stable ischemic heart disease-Class I recommendation). 1.  Severe native three-vessel CAD 2.  LIMA to LAD and SVG to OM widely patent 3.  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  Successful direct stenting of the distal SVG to PDA with distal protection with a 4.0 x 15 mm drug-eluting stent with intravascular ultrasound guidance 5.  Aspirin  and clopidogrel  for at least 6 months followed by clopidogrel  indefinitely 6.  Continue workup for aortic valve replacement    ECHO 04/2024: 1. Left ventricular ejection fraction, by estimation, is 60 to 65%. The left ventricle has normal function. The left ventricle has no regional wall motion abnormalities. The left ventricular internal cavity size was mildly dilated. Left ventricular diastolic parameters are consistent with Grade I diastolic dysfunction (impaired relaxation).   2. Right ventricular systolic function is normal. The right ventricular  size is normal.   3. The mitral valve is normal in structure. No evidence of mitral valve  regurgitation.   4. The aortic valve is normal in structure. Aortic valve regurgitation is  trivial. Severe aortic valve stenosis.   TELEMETRY reviewed by me 07/27/2024: sinus rhythm, rate 70s  EKG reviewed by me: sinus tachycardia ST depression, rate 129 bpm  Data reviewed by me 07/27/2024: last 24h vitals tele labs imaging I/O hospitalist progress note, PCCM notes  Principal Problem:   Acute respiratory distress Active Problems:   Hyperlipidemia   Cognitive impairment   Alcohol  abuse   Essential hypertension   CAD (coronary artery disease)   Stroke (HCC)   Iron  deficiency anemia   Acute on chronic diastolic CHF (congestive heart failure) (HCC)  COPD exacerbation (HCC)   Elevated lactic acid level   Lobar pneumonia (HCC)   Metabolic acidosis, increased anion gap   Myocardial injury    ASSESSMENT AND PLAN:  [name] is a 63 y.o. 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 Found to have pneumonia, overnight troponins uptrending. Cardiology was consulted for further evaluation.   # Ventricular Ectopy Per tele patient with bigeminy PVCs, started on IV amio on 07/30 for ventricular ectopy. PVC much improved s/p IV amio. Per tele this AM SR with rate 90s with no more ventricular ectopy -Continue to monitor telemetry closely.  -Monitor and replenish electrolytes for a goal K >4, Mag >2  -Continue Amio 400 mg BID for 10 days, then 200 mg daily.   # Acute hypoxic respiratory failure, mechanical ventilation # Aspiration pneumonia # Alcohol  withdrawal, improving # Acute on chronic HFpEF # Severe aortic stenosis Presented with SOB, cough, imaging concerning for HCAP/COPD exacerbation. LA elevated on presentation at 3.6 > 5.9> 3.4. s/p extubation 08/01. Increased O2 need to 10L, concern for aspiration event on 08/05. NGT removed (08/07). BNP elevated 1500 > 3200 (08/07).  -Transition to PO lasix  40 mg daily.  -Continue PO acetazolamide  500 mg daily.  -Resume home dapagliflozin  10 mg daily.  -Will plan to optimize GDMT as BP allows.  (Home meds: losartan , metoprolol  succinate, spironolactone ) -Further management of respiratory failure/ aspiration PNA as per primary team.  -CIWA protocol given hx of alcohol  abuse. Nicotine  patches ordered per patient request today. -Originally scheduled to see Dr. Katina  7/30 to discuss aortic valve repair/replacement options, this has been rescheduled.  # Demand ischemia # Coronary artery disease s/p CABG x3 (04/2012) s/p recent stent (07/02/24) # Hypertension # Hyperlipidemia With recent LHC, s/p DES to SVG to PDA 07/02/2024. Reported some CP when he first came to hospital which has now resolved, no recurrence. Troponin trended 44 > 144 > 205 > 980 > 1862 > 5766 > 5756. Patient weaned off levo on 07/31, BP improving and stabilizing. Reports chest tightness with deep breathing, MSK tenderness on palpation, trops repeated (08/07), minimally elevated and flat 72 > 75 is demand ischemia, not ACS is setting of stated above hospital concerns. Repeat EKG (08/07) is without acute ischemic changes.   -Continue home plavix  75 mg daily. Continue aspirin  81 mg daily. Continue DAPT due to recent stent placement on 07/16. -Continue Crestor  20 mg daily for high-intensity statin management.  -Continue home metoprolol  succinate 12.5 mg daily.  -Will plan to resume home losartan  as BP allows.  -Recommend MAP > 65.   # Acute on chronic anemia # Hx AVM Patient received blood transfusion on 07/28, 07/31, 08/04, 08/09. -Closely monitor H&H. -Continue PPIs. -Goal Hgb >8 due to cardiac history. Recommend transfusion when <8.  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 Continue DAPT due to recent stent placement.  -Management per primary team.    This patient's plan of care was discussed and created with Dr. Florencio and he is in agreement.  Signed: Danita Bloch, PA-C  07/27/2024, 8:54 AM [facility]

## 2024-07-27 NOTE — Progress Notes (Signed)
 Daily Progress Note   Patient Name: [name]       Date: 07/27/2024 DOB: 12-29-1960  Age: 63 y.o. MRN#: [id number] Attending Physician: Caleen Qualia, MD Primary Care Physician: Osa Geralds, NP Admit Date: 07/11/2024  Reason for Consultation/Follow-up: Establishing goals of care  HPI/Brief Hospital Review: 63 y.o. male  with past medical history of GIB (04/2024), ETOH abuse, COPD with chronic respiratory failure on 2L [place] at baseline, CAD s/p CABG and recent stent placement s/p LHC (07/02/2024), severe aortic stenosis, CVA, dementia, CHF and HLD admitted from home on 07/11/2024 with shortness of breath and productive cough.   Admitted for COPD exacerbation and acute hypoxic respiratory failure, admission complicated by ETOH withdrawal requiring phenobarbital  and precedex , suffered aspiration event 7/30--led to brief cardiac arrest (x2 minutes prior to ROSC) and required intubation-extubated 8/1--8/6 required HHFNC, has since been weaned to [facility]   Palliative medicine was consulted for assisting with goals of care conversations.  Subjective: Extensive chart review has been completed prior to meeting patient including labs, vital signs, imaging, progress notes, orders, and available advanced directive documents from current and previous encounters.    Visited with [name] at his bedside. He is awake, alert and able to engage in conversation. He was transferred out of ICU yesterday afternoon. He is only oriented to self today during my assessment. He denies acute complaints of pain or discomfort. No family or visitors at bedside during time of visit.  I called and spoke with [name]-friend listed in emergency contacts. Bari shares [name] lives in an in-law suite behind her and her  husbands-Terry home. [name] has been acquainted with [name] for many years. [name] moved into their in law suite 02/2023 after being homeless. Bari speaks to [name] heavy alcohol  consumption and that she and her husband do not condone this and have had many discussions with [name] related to this. Bari shares the state of his suite after drinking heavily requiring her to spend a significant time cleaning. Bari shares she has not been involved in [name] medical care-recently [name] had decided to establish Bari has HCPOA but paperwork was never completed.  Bari shares [name] is divorced x2. He has recently been in contact with his ex-wife [name] whom he claims will be moving to [place] and remarrying-[name]  confirms this is not accurate information. Bari shares [name] has a son-[name] that is incarcerated and a daughter-unsure of name-[name] unable to provide contact information. Bari shares she has been in contact with [name] ex-wife [name]-the mother of his children and Arland has been made aware of [name] condition-she expressed not wanting herself or her children to be involved.  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 At baseline, she shares during the day he is able to drive and care for himself except when he is drinking which is fairly often.  Will need to establish NOK/surrogate decision maker if mentation continues to be impaired-history of dementia, likely progressing.  Objective:  Physical Exam Constitutional:      General: He is not in acute distress.    Appearance: He is ill-appearing.     Comments: Disheveled   Pulmonary:     Effort: Pulmonary effort is normal. No respiratory distress.  Skin:    General: Skin is warm and dry.  Neurological:     Mental Status: He is alert. He is disoriented.     Sensory: No sensory deficit.     Motor: Weakness present.              Vital Signs: BP 100/62 (BP Location: Left Arm)   Pulse 79   Temp (!) 97.4 F (36.3 C)   Resp 18   Ht 6' (1.829 m)   Wt 97.1 kg   SpO2 99%   BMI 29.03 kg/m  SpO2: SpO2: 99 % O2 Device: O2 Device: Nasal Cannula O2 Flow Rate: O2 Flow Rate (L/min): 2 L/min   Palliative Care Assessment & Plan   Assessment/Recommendation/Plan  Establishment of NOK/surrogate decision maker PMT to continue to follow for ongoing needs and support  Thank you for allowing the Palliative Medicine Team to assist in the care of this patient.  Total time:  50 minutes  Time spent includes: Detailed review of medical records (labs, imaging, vital signs), medically appropriate exam (mental status, respiratory, cardiac, skin), discussed with treatment team, counseling and educating patient, family and staff, documenting clinical information, medication management and coordination of care.  Waddell Lesches, DNP, AGNP-C Palliative Medicine   Please contact Palliative Medicine Team phone at [phone] for questions and concerns.

## 2024-07-28 DIAGNOSIS — Z515 Encounter for palliative care: Secondary | ICD-10-CM | POA: Diagnosis not present

## 2024-07-28 DIAGNOSIS — J441 Chronic obstructive pulmonary disease with (acute) exacerbation: Secondary | ICD-10-CM | POA: Diagnosis not present

## 2024-07-28 DIAGNOSIS — J189 Pneumonia, unspecified organism: Secondary | ICD-10-CM | POA: Diagnosis not present

## 2024-07-28 DIAGNOSIS — R0603 Acute respiratory distress: Secondary | ICD-10-CM | POA: Diagnosis not present

## 2024-07-28 LAB — BASIC METABOLIC PANEL WITH GFR
Anion gap: 10 (ref 5–15)
BUN: 35 mg/dL — ABNORMAL HIGH (ref 8–23)
CO2: 29 mmol/L (ref 22–32)
Calcium: 9.5 mg/dL (ref 8.9–10.3)
Chloride: 99 mmol/L (ref 98–111)
Creatinine, Ser: 1.32 mg/dL — ABNORMAL HIGH (ref 0.61–1.24)
GFR, Estimated: 60 mL/min — ABNORMAL LOW (ref >60)
Glucose, Bld: 118 mg/dL — ABNORMAL HIGH (ref 70–99)
Potassium: 3.6 mmol/L (ref 3.5–5.1)
Sodium: 138 mmol/L (ref 135–145)

## 2024-07-28 LAB — MAGNESIUM: Magnesium: 1.9 mg/dL (ref 1.7–2.4)

## 2024-07-28 MED ORDER — PANTOPRAZOLE SODIUM 40 MG PO TBEC
40.0000 mg | DELAYED_RELEASE_TABLET | Freq: Two times a day (BID) | ORAL | Status: DC
  Administered 2024-07-28 – 2024-07-31 (×11): 40 mg via ORAL
  Filled 2024-07-28 (×6): qty 1

## 2024-07-28 MED ORDER — GLYCERIN (LAXATIVE) 2 G RE SUPP: 1.0000 | Freq: Every day | RECTAL | Status: DC | PRN

## 2024-07-28 MED ORDER — ORAL CARE MOUTH RINSE
15.0000 mL | OROMUCOSAL | Status: DC | PRN
Start: 2024-07-28 — End: 2024-07-31

## 2024-07-28 MED ORDER — SENNOSIDES-DOCUSATE SODIUM 8.6-50 MG PO TABS
2.0000 | ORAL_TABLET | Freq: Every day | ORAL | Status: DC
  Administered 2024-07-28 – 2024-07-30 (×6): 2 via ORAL
  Filled 2024-07-28 (×3): qty 2

## 2024-07-28 NOTE — Plan of Care (Signed)
  Problem: Education: Goal: Knowledge of General Education information will improve Description: Including pain rating scale, medication(s)/side effects and non-pharmacologic comfort measures Outcome: Progressing   Problem: Health Behavior/Discharge Planning: Goal: Ability to manage health-related needs will improve Outcome: Progressing   Problem: Clinical Measurements: Goal: Ability to maintain clinical measurements within normal limits will improve Outcome: Progressing Goal: Will remain free from infection Outcome: Progressing Goal: Diagnostic test results will improve Outcome: Progressing Goal: Respiratory complications will improve Outcome: Progressing Goal: Cardiovascular complication will be avoided Outcome: Progressing   Problem: Activity: Goal: Risk for activity intolerance will decrease Outcome: Progressing   Problem: Nutrition: Goal: Adequate nutrition will be maintained Outcome: Progressing   Problem: Coping: Goal: Level of anxiety will decrease Outcome: Progressing   Problem: Elimination: Goal: Will not experience complications related to bowel motility Outcome: Progressing Goal: Will not experience complications related to urinary retention Outcome: Progressing   Problem: Pain Managment: Goal: General experience of comfort will improve and/or be controlled Outcome: Progressing   Problem: Safety: Goal: Ability to remain free from injury will improve Outcome: Progressing   Problem: Skin Integrity: Goal: Risk for impaired skin integrity will decrease Outcome: Progressing   Problem: Education: Goal: Knowledge of disease or condition will improve Outcome: Progressing Goal: Knowledge of the prescribed therapeutic regimen will improve Outcome: Progressing Goal: Individualized Educational Video(s) Outcome: Progressing   Problem: Activity: Goal: Ability to tolerate increased activity will improve Outcome: Progressing Goal: Will verbalize the  importance of balancing activity with adequate rest periods Outcome: Progressing   Problem: Respiratory: Goal: Ability to maintain a clear airway will improve Outcome: Progressing Goal: Levels of oxygenation will improve Outcome: Progressing Goal: Ability to maintain adequate ventilation will improve Outcome: Progressing   Problem: Activity: Goal: Ability to tolerate increased activity will improve Outcome: Progressing   Problem: Clinical Measurements: Goal: Ability to maintain a body temperature in the normal range will improve Outcome: Progressing   Problem: Respiratory: Goal: Ability to maintain adequate ventilation will improve Outcome: Progressing Goal: Ability to maintain a clear airway will improve Outcome: Progressing   Problem: Activity: Goal: Ability to tolerate increased activity will improve Outcome: Progressing   Problem: Respiratory: Goal: Ability to maintain a clear airway and adequate ventilation will improve Outcome: Progressing   Problem: Role Relationship: Goal: Method of communication will improve Outcome: Progressing

## 2024-07-28 NOTE — TOC Progression Note (Signed)
 Transition of Care [facility]) - Progression Note    Patient Details  Name: [name] MRN: [id number] Date of Birth: [birth year]  Transition of Care [facility]) CM/SW Contact  [name], LCSW Phone Number: 07/28/2024, 4:09 PM  Clinical Narrative:   Patient will consider SNF placement. CSW sent out referral.  Expected Discharge Plan and Services                                               Social Drivers of Health (SDOH) Interventions SDOH Screenings   Food Insecurity: No Food Insecurity (07/12/2024)  Housing: Low Risk  (07/12/2024)  Transportation Needs: No Transportation Needs (07/12/2024)  Utilities: Not At Risk (07/12/2024)  Social Connections: Socially Isolated (07/12/2024)  Tobacco Use: High Risk (07/11/2024)    Readmission Risk Interventions    05/14/2024    8:44 AM 10/27/2022   11:28 AM  Readmission Risk Prevention Plan  Transportation Screening  Complete  PCP or Specialist Appt within 5-7 Days  Complete  PCP or Specialist Appt within 3-5 Days Complete   Home Care Screening  Complete  Medication Review (RN CM)  Complete  HRI or Home Care Consult Complete   Social Work Consult for Recovery Care Planning/Counseling Complete   Palliative Care Screening Not Applicable   Medication Review [employer]) Complete

## 2024-07-28 NOTE — NC FL2 (Signed)
 [place]  MEDICAID FL2 LEVEL OF CARE FORM     IDENTIFICATION  Patient Name: [name] Birthdate: [birth year] Sex: male Admission Date (Current Location): 07/11/2024  [facility] and [place] Number:  Chiropodist and Address:  [facility], [address] [place], [place] [address]      Provider Number: [id number]  Attending Physician Name and Address:  Caleen Qualia, MD  Relative Name and Phone Number:       Current Level of Care: Hospital Recommended Level of Care: Skilled Nursing Facility Prior Approval Number:    Date Approved/Denied:   PASRR Number: [id number]  Discharge Plan: SNF    Current Diagnoses: Patient Active Problem List   Diagnosis Date Noted   Acute respiratory distress 07/12/2024   Lobar pneumonia (HCC) 07/12/2024   Metabolic acidosis, increased anion gap 07/12/2024   Myocardial injury 07/12/2024   COPD exacerbation (HCC) 07/11/2024   Elevated lactic acid level 07/11/2024   Coronary artery disease 07/02/2024   Acute on chronic diastolic CHF (congestive heart failure) (HCC) 06/25/2024   Symptomatic severe aortic stenosis with normal ejection fraction 06/24/2024   Depression 05/07/2024   Chronic obstructive pulmonary disease (COPD) (HCC) 05/07/2024   AVM (arteriovenous malformation) of small bowel, 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   Rectal bleeding 06/01/2023   GI bleeding 05/31/2023   CAD (coronary artery disease) 05/31/2023   Stroke (HCC) 05/31/2023   Iron  deficiency anemia 05/31/2023   Hyponatremia 05/13/2023   Alcohol  use disorder 05/13/2023   Elevated troponin 05/13/2023   ABLA (acute blood loss anemia) 05/13/2023   Iron  deficiency anemia due to chronic blood loss 10/30/2022   Obesity hypoventilation syndrome (HCC) 10/29/2022   COPD with acute exacerbation (HCC) 10/28/2022   Melena 10/26/2022   Symptomatic anemia 10/25/2022   Acute diastolic CHF (congestive heart failure) (HCC) 10/25/2022   Essential hypertension  10/25/2022   GERD without esophagitis 10/25/2022   Dyslipidemia 10/25/2022   Special screening for malignant neoplasms, colon    Polyp of sigmoid colon    Orthostatic hypotension 06/04/2021   Stage 3a chronic kidney disease (HCC) 10/05/2020   Alcohol  abuse    Syncope and collapse 05/31/2020   AKI (acute kidney injury) (HCC) 05/31/2020   Hypokalemia 05/31/2020   Memory loss or impairment 12/24/2018   Memory loss of unknown cause 10/14/2018   Sleeping difficulty 10/14/2018   Mild aortic valve stenosis 10/08/2018   Hyperlipidemia 03/27/2018   Cognitive impairment 03/12/2018   History of stroke s/p left carotid endarterectomy 03/12/2018   Hypertension 03/01/2018   Atherosclerosis of native arteries of extremity with intermittent claudication (HCC) 03/01/2018   Atherosclerotic peripheral vascular disease with intermittent claudication (HCC) 01/03/2018   Benign essential HTN 12/07/2017   Bilateral carotid artery stenosis 12/07/2017   SOBOE (shortness of breath on exertion) 12/07/2017   Acute cholecystitis 07/24/2016   Coronary artery disease involving coronary bypass graft of native heart without angina pectoris    Tobacco abuse     Orientation RESPIRATION BLADDER Height & Weight     Self, Time, Situation, Place  O2 (Nasal Cannula 2 L. Bipap discontinued 7/30.) Continent Weight: 215 lb 2.7 oz (97.6 kg) Height:  6' (182.9 cm)  BEHAVIORAL SYMPTOMS/MOOD NEUROLOGICAL BOWEL NUTRITION STATUS   (None)   Continent Diet (DYS 2)  AMBULATORY STATUS COMMUNICATION OF NEEDS Skin   Limited Assist Verbally Bruising, Other (Comment) (Scratch marks.)                       Personal Care Assistance Level of  Assistance  Bathing, Feeding, Dressing Bathing Assistance: Limited assistance Feeding assistance: Limited assistance Dressing Assistance: Limited assistance     Functional Limitations Info  Sight, Hearing, Speech Sight Info: Adequate Hearing Info: Adequate Speech Info: Adequate     SPECIAL CARE FACTORS FREQUENCY  PT (By licensed PT), OT (By licensed OT)     PT Frequency: 5 x week OT Frequency: 5 x week            Contractures Contractures Info: Not present    Additional Factors Info  Code Status, Allergies Code Status Info: Full code Allergies Info: Wellbutrin  (Bupropion )           Current Medications (07/28/2024):  This is the current hospital active medication list Current Facility-Administered Medications  Medication Dose Route Frequency Provider Last Rate Last Admin   acetaminophen  (TYLENOL ) tablet 650 mg  650 mg Oral Q6H PRN Chappell, Alex B, RPH   650 mg at 07/28/24 9047   albuterol  (PROVENTIL ) (2.5 MG/3ML) 0.083% nebulizer solution 3 mL  3 mL Nebulization Q4H PRN Jens Durand, MD   3 mL at 07/23/24 0023   amiodarone  (PACERONE ) tablet 200 mg  200 mg Oral Daily Chappell, Alex B, RPH   200 mg at 07/28/24 9063   ascorbic acid  (VITAMIN C ) tablet 500 mg  500 mg Oral Daily Chappell, Alex B, RPH   500 mg at 07/28/24 0935   aspirin  chewable tablet 81 mg  81 mg Oral Daily Chappell, Alex B, RPH   81 mg at 07/28/24 0935   budesonide  (PULMICORT ) nebulizer solution 0.5 mg  0.5 mg Nebulization BID Kasa, Kurian, MD   0.5 mg at 07/28/24 0744   clopidogrel  (PLAVIX ) tablet 75 mg  75 mg Oral Daily Chappell, Alex B, RPH   75 mg at 07/28/24 9063   dapagliflozin  propanediol (FARXIGA ) tablet 10 mg  10 mg Oral Daily Hudson, Caralyn, PA-C   10 mg at 07/28/24 0935   dextromethorphan -guaiFENesin  (MUCINEX  DM) 30-600 MG per 12 hr tablet 1 tablet  1 tablet Oral BID PRN Jens Durand, MD   1 tablet at 07/12/24 2248   diazepam  (VALIUM ) injection 2.5 mg  2.5 mg Intravenous Q6H PRN Kasa, Kurian, MD   2.5 mg at 07/26/24 2207   enoxaparin  (LOVENOX ) injection 40 mg  40 mg Subcutaneous QHS Jens Durand, MD   40 mg at 07/27/24 2131   feeding supplement (ENSURE PLUS HIGH PROTEIN) liquid 237 mL  237 mL Oral TID BM Amin, Sumayya, MD   237 mL at 07/28/24 1230   folic acid  (FOLVITE )  tablet 1 mg  1 mg Oral Daily Chappell, Alex B, RPH   1 mg at 07/28/24 0935   furosemide  (LASIX ) tablet 40 mg  40 mg Oral Daily Hudson, Caralyn, PA-C   40 mg at 07/28/24 0935   Glycerin  (Adult) 2 g suppository 1 suppository  1 suppository Rectal Daily PRN Arloa Waddell RAMAN, NP       ipratropium-albuterol  (DUONEB) 0.5-2.5 (3) MG/3ML nebulizer solution 3 mL  3 mL Nebulization TID Amin, Sumayya, MD   3 mL at 07/28/24 1341   iron  polysaccharides (NIFEREX) capsule 150 mg  150 mg Oral Daily Chappell, Alex B, RPH   150 mg at 07/28/24 0935   lactulose  (CHRONULAC ) 10 GM/15ML solution 20 g  20 g Oral Daily PRN Chappell, Alex B, RPH       melatonin tablet 5 mg  5 mg Per Tube QHS PRN Jens Durand, MD   5 mg at 07/27/24 2132   metoprolol  succinate (  TOPROL -XL) 24 hr tablet 12.5 mg  12.5 mg Oral Daily Decoste, Gabriella, PA-C   12.5 mg at 07/28/24 0936   metoprolol  tartrate (LOPRESSOR ) injection 2.5 mg  2.5 mg Intravenous Q5 min PRN Jens Durand, MD       morphine  (PF) 2 MG/ML injection 2 mg  2 mg Intravenous Q4H PRN Jens Durand, MD   2 mg at 07/28/24 1133   multivitamin with minerals tablet 1 tablet  1 tablet Oral Daily Chappell, Alex B, RPH   1 tablet at 07/28/24 9063   nicotine  (NICODERM CQ  - dosed in mg/24 hr) patch 7 mg  7 mg Transdermal Daily Hudson, Caralyn, PA-C   7 mg at 07/28/24 9063   nitroGLYCERIN  (NITROSTAT ) SL tablet 0.4 mg  0.4 mg Sublingual Q5 min PRN Jens Durand, MD       ondansetron  (ZOFRAN ) injection 4 mg  4 mg Intravenous Q8H PRN Jens Durand, MD   4 mg at 07/16/24 9560   Oral care mouth rinse  15 mL Mouth Rinse 4 times per day Jens Durand, MD   15 mL at 07/28/24 1131   Oral care mouth rinse  15 mL Mouth Rinse PRN Jens Durand, MD       Oral care mouth rinse  15 mL Mouth Rinse PRN Mansy, Madison LABOR, MD       pantoprazole  (PROTONIX ) EC tablet 40 mg  40 mg Oral BID AC Amin, Sumayya, MD   40 mg at 07/28/24 1527   phenol (CHLORASEPTIC) mouth spray 1 spray  1 spray Mouth/Throat PRN Amin,  Sumayya, MD   1 spray at 07/27/24 1447   polyethylene glycol (MIRALAX  / GLYCOLAX ) packet 17 g  17 g Oral Daily Chappell, Alex B, RPH   17 g at 07/28/24 0932   predniSONE  (DELTASONE ) tablet 20 mg  20 mg Oral Q breakfast Amin, Sumayya, MD   20 mg at 07/28/24 0935   rosuvastatin  (CRESTOR ) tablet 20 mg  20 mg Oral Daily Chappell, Alex B, RPH   20 mg at 07/28/24 0935   senna-docusate (Senokot-S) tablet 2 tablet  2 tablet Oral QHS Arloa Waddell RAMAN, NP       thiamine  (VITAMIN B1) tablet 100 mg  100 mg Oral Daily Chappell, Alex B, RPH   100 mg at 07/28/24 9063   Vitamin D  (Ergocalciferol ) (DRISDOL ) 1.25 MG (50000 UNIT) capsule 50,000 Units  50,000 Units Oral Q7 days Amin, Sumayya, MD   50,000 Units at 07/26/24 9043     Discharge Medications: Please see discharge summary for a list of discharge medications.  Relevant Imaging Results:  Relevant Lab Results:   Additional Information SS#: [id number]  [name], LCSW

## 2024-07-28 NOTE — Progress Notes (Addendum)
 [facility] CLINIC CARDIOLOGY PROGRESS NOTE       Patient ID: [name] MRN: [id number] DOB/AGE: 1961-02-14 63 y.o.  Admit date: 07/11/2024 Referring Physician Dr. Charlie Sellar Primary Physician Osa Geralds, NP  Primary Cardiologist Tinnie Maiden, NP  Reason for Consultation elevated troponins  HPI: [name] is a 63 y.o. 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 Found to have pneumonia, overnight troponins uptrending. Cardiology was consulted for further evaluation.   Interval history: -Patient seen and examined this AM, resting in bedside chair in no acute distress. Patient states SOB feels okay today. Denies chest pain/pressure. -Patient states he mainly feels fatigued/tired this AM and reports he didn't sleep well. -Patient BP and HR stable this AM. No significant events on telemetry.  -CO2 stabilized and BUN improved s/p acetazolamide .   Review of systems complete and found to be negative unless listed above    Past Medical History:  Diagnosis Date   COPD (chronic obstructive pulmonary disease) (HCC)    CVA (cerebral infarction)    Headache    mild, since stroke 2012   Hypertension    MI (myocardial infarction) (HCC) 05/09/2012   Reading difficulty    pt reports he reads at about a second grade level   Stroke [facility]) 2012   numbness to left hand    Wears dentures    full upper and lower    Past Surgical History:  Procedure Laterality Date   CAROTID ENDARTERECTOMY Left    2012 or 2013   CHOLECYSTECTOMY N/A 07/26/2016   Procedure: LAPAROSCOPIC CHOLECYSTECTOMY;  Surgeon: Charlie FORBES Fell, MD;  Location: [facility] ORS;  Service:  General;  Laterality: N/A;   COLONOSCOPY N/A 05/08/2024   Procedure: COLONOSCOPY;  Surgeon: Jinny Carmine, MD;  Location: [facility] ENDOSCOPY;  Service: Endoscopy;  Laterality: N/A;   COLONOSCOPY WITH PROPOFOL  N/A 02/09/2022   Procedure: COLONOSCOPY WITH PROPOFOL ;  Surgeon: Jinny Carmine, MD;  Location: [facility] ENDOSCOPY;  Service: Endoscopy;  Laterality: N/A;   CORONARY ARTERY BYPASS GRAFT  05/10/2012   3 vessel   CORONARY STENT INTERVENTION N/A 07/02/2024   Procedure: CORONARY STENT INTERVENTION;  Surgeon: Katina Albright, MD;  Location: [facility] INVASIVE CV LAB;  Service: Cardiovascular;  Laterality: N/A;   CORONARY ULTRASOUND/IVUS N/A 07/02/2024   Procedure: Coronary Ultrasound/IVUS;  Surgeon: Katina Albright, MD;  Location: [facility] INVASIVE CV LAB;  Service: Cardiovascular;  Laterality: N/A;   ENTEROSCOPY N/A 05/16/2023   Procedure: ENTEROSCOPY;  Surgeon: Therisa Bi, MD;  Location: [facility] ENDOSCOPY;  Service: Gastroenterology;  Laterality: N/A;   ENTEROSCOPY N/A 06/01/2023   Procedure: ENTEROSCOPY;  Surgeon: Unk Corinn Skiff, MD;  Location: [facility] ENDOSCOPY;  Service: Gastroenterology;  Laterality: N/A;   ESOPHAGOGASTRODUODENOSCOPY  05/13/2023   Procedure: ESOPHAGOGASTRODUODENOSCOPY (EGD);  Surgeon: Jinny Carmine, MD;  Location: [facility] ENDOSCOPY;  Service: Endoscopy;;   ESOPHAGOGASTRODUODENOSCOPY (EGD) WITH PROPOFOL  N/A 10/28/2022   Procedure: ESOPHAGOGASTRODUODENOSCOPY (EGD) WITH PROPOFOL ;  Surgeon: Onita Elspeth Sharper, DO;  Location: [facility] ENDOSCOPY;  Service: Gastroenterology;  Laterality: N/A;   GIVENS CAPSULE STUDY  05/13/2023   Procedure: GIVENS CAPSULE STUDY;  Surgeon: Jinny Carmine, MD;  Location: [facility] ENDOSCOPY;  Service: Endoscopy;;   GIVENS CAPSULE STUDY  06/01/2023   Procedure:  GIVENS CAPSULE STUDY;  Surgeon: Unk Corinn Skiff, MD;  Location: [facility] ENDOSCOPY;  Service: Gastroenterology;;   LEFT HEART CATH AND CORS/GRAFTS ANGIOGRAPHY N/A 07/02/2024   Procedure: LEFT HEART CATH AND CORS/GRAFTS ANGIOGRAPHY;   Surgeon: Katina Albright, MD;  Location: [facility] INVASIVE CV LAB;  Service: Cardiovascular;  Laterality: N/A;    Medications Prior to Admission  Medication Sig Dispense Refill Last Dose/Taking   albuterol  (VENTOLIN  HFA) 108 (90 Base) MCG/ACT inhaler Inhale 2 puffs into the lungs every 6 (six) hours as needed for wheezing or shortness of breath. 6.7 g 2 07/11/2024 Morning   ascorbic acid  (VITAMIN C ) 500 MG tablet Take 1 tablet (500 mg total) by mouth daily. 30 tablet 2 07/11/2024 Morning   aspirin  EC 81 MG tablet Take 1 tablet (81 mg total) by mouth daily. Swallow whole. 30 tablet 5 07/11/2024 Morning   clopidogrel  (PLAVIX ) 75 MG tablet Take 1 tablet (75 mg total) by mouth daily with breakfast. 30 tablet 11 07/11/2024 Morning   dapagliflozin  propanediol (FARXIGA ) 10 MG TABS tablet Take 1 tablet (10 mg total) by mouth daily. 30 tablet 11 07/11/2024 Morning   folic acid  (FOLVITE ) 1 MG tablet Take 1 tablet (1 mg total) by mouth daily. 30 tablet 2 07/11/2024   iron  polysaccharides (NIFEREX) 150 MG capsule Take 1 capsule (150 mg total) by mouth daily. 30 capsule 2 07/11/2024   isosorbide  mononitrate (IMDUR ) 60 MG 24 hr tablet Take 1 tablet (60 mg total) by mouth daily. 30 tablet 11 07/11/2024   losartan  (COZAAR ) 25 MG tablet Take 0.5 tablets (12.5 mg total) by mouth daily. 30 tablet 11 07/11/2024 Morning   metoprolol  succinate (TOPROL -XL) 25 MG 24 hr tablet Take 0.5 tablets (12.5 mg total) by mouth daily. 30 tablet 11 07/11/2024 Morning   pantoprazole  (PROTONIX ) 40 MG tablet Take 1 tablet (40 mg total) by mouth daily. 30 tablet 2 07/11/2024 Morning   pravastatin  (PRAVACHOL ) 20 MG tablet Take 1 tablet (20 mg total) by mouth at bedtime. 30 tablet 11 07/10/2024 Evening   spironolactone  (ALDACTONE ) 25 MG tablet Take 0.5 tablets (12.5 mg total) by mouth daily. 30 tablet 11 07/11/2024 Morning   torsemide  (DEMADEX ) 10 MG tablet Take 1 tablet (10 mg total) by mouth daily. 30 tablet 11 07/11/2024   Social History   Socioeconomic  History   Marital status: Divorced    Spouse name: Not on file   Number of children: Not on file   Years of education: Not on file   Highest education level: Not on file  Occupational History   Not on file  Tobacco Use   Smoking status: Former    Current packs/day: 0.00    Types: Cigarettes    Quit date: 46    Years since quitting: 32.6   Smokeless tobacco: Current    Types: Snuff   Tobacco comments:    changed to dip  Vaping Use   Vaping status: Never Used  Substance and Sexual Activity   Alcohol  use: Yes    Alcohol /week: 26.0 standard drinks of alcohol     Types: 26 Cans of beer per week   Drug use: No   Sexual activity: Yes    Birth control/protection: None  Other Topics Concern   Not on file  Social History Narrative   Not on file   Social Drivers of Health   Financial Resource Strain: Not on file  Food Insecurity: No Food Insecurity (07/12/2024)   Hunger Vital Sign    Worried About Running Out of Food in  the Last Year: Never true    Ran Out of Food in the Last Year: Never true  Transportation Needs: No Transportation Needs (07/12/2024)   PRAPARE - Administrator, Civil Service (Medical): No    Lack of Transportation (Non-Medical): No  Physical Activity: Not on file  Stress: Not on file  Social Connections: Socially Isolated (07/12/2024)   Social Connection and Isolation Panel    Frequency of Communication with Friends and Family: Once a week    Frequency of Social Gatherings with Friends and Family: Once a week    Attends Religious Services: Never    Database administrator or Organizations: No    Attends Banker Meetings: Never    Marital Status: Divorced  Catering manager Violence: Not At Risk (07/12/2024)   Humiliation, Afraid, Rape, and Kick questionnaire    Fear of Current or Ex-Partner: No    Emotionally Abused: No    Physically Abused: No    Sexually Abused: No    Family History  Problem Relation Age of Onset   Pneumonia  Mother      Vitals:   07/28/24 0330 07/28/24 0500 07/28/24 0737 07/28/24 0747  BP: (!) 112/46  126/73   Pulse: 88  82   Resp: 18  19   Temp: (!) 97.1 F (36.2 C)  98.1 F (36.7 C)   TempSrc:      SpO2: 97%  99% 97%  Weight:  97.6 kg    Height:        PHYSICAL EXAM General: Ill appearing male, no acute distress. HEENT: Normocephalic and atraumatic. Neck: No JVD.  Lungs: Normal respiratory effort.  Diminished breath sounds bilaterally. Heart: HRRR, Normal S1 and S2 without gallops. +systolic ejection murmur.   Abdomen: Non-distended appearing.  Msk: Normal strength and tone for age. Extremities: Warm and well perfused. No clubbing, cyanosis, trace pedal edema.   Labs: Basic Metabolic Panel: Recent Labs    07/26/24 0426 07/26/24 1353 07/27/24 0536 07/28/24 0615  NA 137   < > 138 138  K 3.7   < > 3.2* 3.6  CL 94*   < > 97* 99  CO2 32   < > 27 29  GLUCOSE 170*   < > 115* 118*  BUN 53*   < > 41* 35*  CREATININE 1.38*   < > 1.39* 1.32*  CALCIUM  8.6*   < > 8.8* 9.5  MG 2.4  --  2.0 1.9  PHOS 4.0  --   --   --    < > = values in this interval not displayed.   Liver Function Tests: No results for input(s): AST, ALT, ALKPHOS, BILITOT, PROT, ALBUMIN  in the last 72 hours.  No results for input(s): LIPASE, AMYLASE in the last 72 hours. CBC: Recent Labs    07/26/24 0426 07/27/24 0535  WBC 16.1* 13.4*  HGB 8.7* 8.7*  HCT 28.3* 28.4*  MCV 93.4 93.7  PLT 274 251   Cardiac Enzymes: No results for input(s): CKTOTAL, CKMB, CKMBINDEX, TROPONINIHS in the last 72 hours.    BNP: No results for input(s): BNP in the last 72 hours.   D-Dimer: No results for input(s): DDIMER in the last 72 hours. Hemoglobin A1C: No results for input(s): HGBA1C in the last 72 hours. Fasting Lipid Panel: No results for input(s): CHOL, HDL, LDLCALC, TRIG, CHOLHDL, LDLDIRECT in the last 72 hours.  Thyroid Function Tests: No results for input(s):  TSH, T4TOTAL, T3FREE, THYROIDAB in the last 72  hours.  Invalid input(s): FREET3 Anemia Panel: No results for input(s): VITAMINB12, FOLATE, FERRITIN, TIBC, IRON , RETICCTPCT in the last 72 hours.   Radiology: DG Chest 1 View Result Date: 07/24/2024 CLINICAL DATA:  [id number] Dyspnea [id number] EXAM: CHEST  1 VIEW COMPARISON:  July 23, 2024, July 11, 2024 FINDINGS: Esophagogastric tube terminates below the diaphragm, outside the field of view. Diffuse interstitial opacities throughout both lungs. No lobar consolidation or pneumothorax. Lateral right upper lobe nodular opacity measures 1.9 cm, corresponding to the lung nodule on the prior chest CT. Moderate cardiomegaly. Sternotomy wires and CABG markers. No acute fracture or destructive lesions. Multilevel thoracic osteophytosis. IMPRESSION: 1. Moderate cardiomegaly. Unchanged interstitial opacities throughout both lungs, possibly persistent interstitial edema and underlying emphysematous changes. 2. Similarly appearing lateral right upper lung zone nodular opacity. Please see the comparison chest CT for further characterization. Electronically Signed   By: Rogelia Myers M.D.   On: 07/24/2024 08:43   DG Chest Port 1 View Result Date: 07/23/2024 CLINICAL DATA:  Shortness of breath. EXAM: PORTABLE CHEST 1 VIEW COMPARISON:  July 22, 2024. FINDINGS: The heart size and mediastinal contours are within normal limits. Status post coronary bypass graft. Mild diffuse interstitial densities are noted concerning for possible pulmonary edema with possible small pleural effusions. Nasogastric tube is seen entering stomach. The visualized skeletal structures are unremarkable. IMPRESSION: Mild diffuse interstitial densities are noted concerning for possible pulmonary edema with small pleural effusions. Electronically Signed   By: Lynwood Landy Raddle M.D.   On: 07/23/2024 13:03   DG ABD ACUTE 2+V W 1V CHEST Result Date: 07/22/2024 CLINICAL DATA:  [id number] Dyspnea  [id number] [phone] Encounter for imaging study to confirm nasogastric (NG) tube placement [id number] EXAM: DG ABDOMEN ACUTE WITH 1 VIEW CHEST COMPARISON:  Chest x-ray 07/19/2024 FINDINGS: Enteric tube courses below the diaphragm with tip and side port overlying the expected region of the gastric lumen. The heart and mediastinal contours are within normal limits. No focal consolidation. Chronic coarsened interstitial markings with no overt pulmonary edema. Bilateral trace pleural effusion. No pneumothorax. Right upper quadrant surgical clips. There is no evidence of dilated bowel loops or free intraperitoneal air. No radiopaque calculi or other significant radiographic abnormality is seen. No acute osseous abnormality.  Intact sternotomy wires. IMPRESSION: 1. Enteric tube in good position. 2. Bowel trace pleural effusions. 3. Nonobstructive bowel gas pattern. Electronically Signed   By: Morgane  Naveau M.D.   On: 07/22/2024 14:18   DG Chest Port 1 View Result Date: 07/19/2024 CLINICAL DATA:  Dyspnea EXAM: PORTABLE CHEST 1 VIEW COMPARISON:  07/18/2024 FINDINGS: Two frontal views of the chest demonstrate an enteric catheter passing below diaphragm, tip excluded by collimation. Left internal jugular catheter tip overlies superior vena cava unchanged. 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 The cardiac silhouette is stable. Mild central vascular congestion, with a trace right pleural effusion unchanged since prior exam. The nodular density projecting over the right upper lateral chest on prior study is less well seen on this exam. This may be due to positioning. No pneumothorax. No acute bony abnormalities. IMPRESSION: 1. Pulmonary vascular congestion and small right pleural effusion, unchanged since prior study. 2. The nodular consolidation within the right upper lateral chest seen on prior x-ray and CT is not well seen on this exam, likely due to patient positioning. Electronically Signed   By: Ozell Daring M.D.   On:  07/19/2024 17:34   DG Chest Port 1 View Result Date: 07/18/2024 EXAM: 1 VIEW XRAY OF THE CHEST 07/18/2024 04:59:06  AM COMPARISON: 07/16/2024 CLINICAL HISTORY: [id number] Acute respiratory failure with hypoxia (HCC) 427266. Acute respiratory failure with hypoxia FINDINGS: LUNGS AND PLEURA: Nodular opacity within the periphery of the right upper lobe appears similar to CT from 07/11/2024. Please refer to that report for follow-up recommendations. Mild asymmetric hazy opacification over the right mid and right lower lung may be related to positional artifact versus small posterior layering effusion. Mild subsegmental atelectasis in the left base. HEART AND MEDIASTINUM: Stable cardiomediastinal contours. Previous median sternotomy and CABG procedure. BONES AND SOFT TISSUES: Aortic atherosclerotic calcification. Left IJ catheter is identified with the tip in the projection of the SVC. There is an enteric tube which courses below the field of view. ET tube tip is stable above the carina. IMPRESSION: 1. Stable nodular opacity within the periphery of the right upper lobe, similar to CT from 07/11/2024. Please refer to that report for follow-up recommendations. 2. Mild asymmetric hazy opacification over the right mid and right lower lung, possibly related to rotational artifact versus small posterior layering effusion. 3. Mild subsegmental atelectasis in the left base. Electronically signed by: Waddell Calk MD 07/18/2024 06:51 AM EDT RP Workstation: [id number]   DG Chest Port 1 View Result Date: 07/16/2024 CLINICAL DATA:  Endotracheal tube and central line placement. EXAM: PORTABLE CHEST 1 VIEW COMPARISON:  07/16/2024 at 8:58 a.m. FINDINGS: Patient is rotated to the right. Endotracheal tube has tip proximally 13.5 cm above the carina. This could be advanced approximately 6 cm. Left IJ central venous catheter has tip over the SVC. Enteric tube courses into the region of the stomach and off the image as tip is not  visualized. Lungs are adequately inflated with possible mild hazy opacification over the right upper lung abutting the minor fissure which may be due to atelectasis or infection. Left lung is clear. Cardiomediastinal silhouette and remainder of the exam is unchanged. IMPRESSION: 1. Endotracheal tube has tip proximally 13.5 cm above the carina. This could be advanced approximately 6 cm. 2. Left IJ central venous catheter with tip over the SVC. 3. Possible mild hazy opacification over the right upper lung abutting the minor fissure which may be due to atelectasis or infection. Electronically Signed   By: Toribio Agreste M.D.   On: 07/16/2024 10:53   DG Chest Port 1 View Result Date: 07/16/2024 CLINICAL DATA:  [id number] Aspiration into airway [id number]. EXAM: PORTABLE CHEST 1 VIEW COMPARISON:  06/23/2024. FINDINGS: Bilateral lungs appear hyperlucent with coarse bronchovascular markings, in keeping with COPD. Bilateral lungs otherwise appear clear. No dense consolidation or lung collapse. Bilateral costophrenic angles are clear. Stable cardio-mediastinal silhouette. There are surgical staples along the heart border and sternotomy wires, status post CABG (coronary artery bypass graft). No acute osseous abnormalities. The soft tissues are within normal limits. Enteric tube is seen coursing below the left hemidiaphragm however, the tip is not included on the film. IMPRESSION: No active disease. COPD. Electronically Signed   By: Ree Molt M.D.   On: 07/16/2024 09:09   DG Abd 1 View Result Date: 07/16/2024 CLINICAL DATA:  Vomiting.  NG tube placement. EXAM: ABDOMEN - 1 VIEW COMPARISON:  07/15/2024 FINDINGS: Nasogastric tube has tip and side-port over the stomach in the upper abdomen just left of midline. Visualized lower thorax unchanged. Abdominopelvic images demonstrate a non obstructive bowel gas pattern. Mild fecal retention throughout the colon. Surgical clips over the right upper quadrant. There are degenerative  changes of the spine and hips. Calcified plaque over the abdominal aorta and iliac arteries. IMPRESSION:  1. Nasogastric tube with tip and side-port over the stomach. 2. Nonobstructive bowel gas pattern. Mild fecal retention. 3. Aortic atherosclerosis. Electronically Signed   By: Toribio Agreste M.D.   On: 07/16/2024 07:51   DG Abd 1 View Result Date: 07/15/2024 CLINICAL DATA:  Orogastric tube placement. EXAM: ABDOMEN - 1 VIEW COMPARISON:  June 29, 2023. FINDINGS: Distal tip of nasogastric tube is seen in expected position of proximal stomach. IMPRESSION: Nasogastric tube tip seen in expected position of proximal stomach. Electronically Signed   By: Lynwood Landy Raddle M.D.   On: 07/15/2024 10:25   DG Chest Port 1 View Result Date: 07/13/2024 CLINICAL DATA:  Respiratory failure with hypoxia. EXAM: PORTABLE CHEST 1 VIEW COMPARISON:  Multiple previous chest x-rays and recent chest CT. FINDINGS: Stable surgical changes from bypass surgery. The cardiac silhouette, mediastinal and hilar contours are within normal limits and stable. Underlying emphysematous changes and pulmonary scarring. No pleural effusions or discrete pulmonary infiltrates. No pneumothorax. IMPRESSION: Emphysematous changes and pulmonary scarring but no acute overlying pulmonary process. Electronically Signed   By: MYRTIS Stammer M.D.   On: 07/13/2024 16:08   US  Abdomen Limited RUQ (LIVER/GB) Result Date: 07/13/2024 CLINICAL DATA:  Portal hypertension. EXAM: ULTRASOUND ABDOMEN LIMITED RIGHT UPPER QUADRANT COMPARISON:  Abdominal ultrasound 10/19/2022 FINDINGS: Gallbladder: Surgically absent. Common bile duct: Diameter: 3.3 mm Liver: No focal lesion identified. Parenchymal echogenicity is increased and diffusely heterogeneous. There is lobular liver contour. Portal vein is patent on color Doppler imaging with normal direction of blood flow towards the liver. Other: None. IMPRESSION: Lobular liver contour with increased echogenicity and heterogeneous  parenchyma consistent with cirrhosis. No focal liver lesion identified. Electronically Signed   By: Greig Pique M.D.   On: 07/13/2024 16:03   DG Chest Port 1 View Result Date: 07/12/2024 CLINICAL DATA:  Shortness of breath. EXAM: PORTABLE CHEST 1 VIEW COMPARISON:  07/11/2024 FINDINGS: The cardio pericardial silhouette is enlarged. There is pulmonary vascular congestion without overt pulmonary edema. Trace bilateral pleural effusions. Old posterior left sixth rib fracture. Telemetry leads overlie the chest. IMPRESSION: Enlargement of the cardiopericardial silhouette with new pulmonary vascular congestion and trace bilateral pleural effusions. Electronically Signed   By: Camellia Candle M.D.   On: 07/12/2024 12:11   CT Angio Chest PE W/Cm &/Or Wo Cm Result Date: 07/11/2024 CLINICAL DATA:  High probability for PE. Chest pain with shortness of breath EXAM: CT ANGIOGRAPHY CHEST WITH CONTRAST TECHNIQUE: Multidetector CT imaging of the chest was performed using the standard protocol during bolus administration of intravenous contrast. Multiplanar CT image reconstructions and MIPs were obtained to evaluate the vascular anatomy. RADIATION DOSE REDUCTION: This exam was performed according to the departmental dose-optimization program which includes automated exposure control, adjustment of the mA and/or kV according to patient size and/or use of iterative reconstruction technique. CONTRAST:  75mL OMNIPAQUE  IOHEXOL  350 MG/ML SOLN COMPARISON:  CT angiogram chest 05/09/2024 FINDINGS: Cardiovascular: Satisfactory opacification of the pulmonary arteries to the segmental level. No evidence of pulmonary embolism. Patient is status post cardiac surgery. The heart is enlarged. There is no pericardial effusion. There are atherosclerotic calcifications of the aorta and coronary arteries. Aorta is normal in size. Mediastinum/Nodes: There are enlarged right hilar lymph nodes measuring up to 11 mm. Enlarged paratracheal lymph nodes  measure up to 10 mm. Visualized thyroid gland and esophagus are within normal limits. Lungs/Pleura: Moderate emphysema again seen. There is stable scarring in the right lung apex. There is new patchy slightly nodular airspace disease in the right  upper lobe measuring 2.0 by 1.1 cm. There is right-sided central peribronchial wall thickening. Previously identified airspace disease in the left lower lobe has resolved. There is no pleural effusion or pneumothorax. There is a stable left lower lobe pulmonary nodule image 6/57. Upper Abdomen: No acute abnormality. Musculoskeletal: No chest wall abnormality. No acute or significant osseous findings. Review of the MIP images confirms the above findings. IMPRESSION: 1. No evidence for pulmonary embolism. 2. New patchy slightly nodular airspace disease in the right upper lobe worrisome for pneumonia. Follow-up CT recommended in 4-6 weeks recommended to ensure resolution and to exclude underlying nodule. 3. Right-sided central peribronchial wall thickening worrisome for bronchitis. 4. Right hilar and mediastinal lymphadenopathy, likely reactive. 5. Stable left lower lobe pulmonary nodule. 6. Cardiomegaly. Aortic Atherosclerosis (ICD10-I70.0) and Emphysema (ICD10-J43.9). Electronically Signed   By: Greig Pique M.D.   On: 07/11/2024 18:54   DG Chest Portable 1 View Result Date: 07/11/2024 CLINICAL DATA:  SOB EXAM: PORTABLE CHEST - 1 VIEW COMPARISON:  June 24, 2024 FINDINGS: Biapical pleural thickening. Sternotomy wires and CABG markers. No focal airspace consolidation, pleural effusion, or pneumothorax. Mild cardiomegaly. Tortuous aorta with aortic atherosclerosis. No acute fracture or destructive lesions. Multilevel thoracic osteophytosis. IMPRESSION: No acute cardiopulmonary abnormality. Electronically Signed   By: Rogelia Myers M.D.   On: 07/11/2024 17:25   CARDIAC CATHETERIZATION Result Date: 07/02/2024   Prox LAD to Mid LAD lesion is 90% stenosed.   Ost LM to Mid  LM lesion is 50% stenosed.   Ost Cx to Prox Cx lesion is 90% stenosed.   Ost RCA to Prox RCA lesion is 100% stenosed.   Mid Graft to Dist Graft lesion is 95% stenosed.   Recommend uninterrupted dual antiplatelet therapy with Aspirin  81mg  daily and Clopidogrel  75mg  daily for a minimum of 6 months (stable ischemic heart disease-Class I recommendation). 1.  Severe native three-vessel CAD 2.  LIMA to LAD and SVG to OM widely patent 3.  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  Successful direct stenting of the distal SVG to PDA with distal protection with a 4.0 x 15 mm drug-eluting stent with intravascular ultrasound guidance 5.  Aspirin  and clopidogrel  for at least 6 months followed by clopidogrel  indefinitely 6.  Continue workup for aortic valve replacement    ECHO 04/2024: 1. Left ventricular ejection fraction, by estimation, is 60 to 65%. The left ventricle has normal function. The left ventricle has no regional wall motion abnormalities. The left ventricular internal cavity size was mildly dilated. Left ventricular diastolic parameters are consistent with Grade I diastolic dysfunction (impaired relaxation).   2. Right ventricular systolic function is normal. The right ventricular  size is normal.   3. The mitral valve is normal in structure. No evidence of mitral valve  regurgitation.   4. The aortic valve is normal in structure. Aortic valve regurgitation is  trivial. Severe aortic valve stenosis.   TELEMETRY reviewed by me 07/28/2024: sinus rhythm, rate 90s  EKG reviewed by me: sinus tachycardia ST depression, rate 129 bpm  Data reviewed by me 07/28/2024: last 24h vitals tele labs imaging I/O hospitalist progress note, PCCM notes  Principal Problem:   Acute respiratory distress Active Problems:   Hyperlipidemia   Cognitive impairment   Alcohol  abuse   Essential hypertension   CAD (coronary artery disease)   Stroke (HCC)   Iron  deficiency anemia   Acute on chronic diastolic  CHF (congestive heart failure) (HCC)   COPD exacerbation (HCC)  Elevated lactic acid level   Lobar pneumonia (HCC)   Metabolic acidosis, increased anion gap   Myocardial injury    ASSESSMENT AND PLAN:  [name] is a 63 y.o. 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 Found to have pneumonia, overnight troponins uptrending. Cardiology was consulted for further evaluation.   # Ventricular Ectopy Per tele patient with bigeminy PVCs, started on IV amio on 07/30 for ventricular ectopy. PVC much improved s/p IV amio. Per tele this AM SR with rate 90s with no more ventricular ectopy -Continue to monitor telemetry closely.  -Monitor and replenish electrolytes for a goal K >4, Mag >2  -Continue Amio 400 mg BID for 10 days, then 200 mg daily.   # Acute hypoxic respiratory failure, mechanical ventilation # Aspiration pneumonia # Alcohol  withdrawal, improving # Acute on chronic HFpEF # Severe aortic stenosis Presented with SOB, cough, imaging concerning for HCAP/COPD exacerbation. LA elevated on presentation at 3.6 > 5.9> 3.4. s/p extubation 08/01. Increased O2 need to 10L, concern for aspiration event on 08/05. NGT removed (08/07). BNP elevated 1500 > 3200 (08/07).  -Continue PO lasix  40 mg daily.  Closely monitor renal function, UOP. Avoid over-diuresis in setting of severe AS.  -Discontinued acetazolamide . CO2 stabilized and BUN improved s/p acetazolamide .  -Continue home dapagliflozin  10 mg daily.  -Will plan to optimize GDMT as BP and renal function allows. -Further management of respiratory failure/ aspiration PNA as per primary team.  -CIWA protocol given  hx of alcohol  abuse. -Originally scheduled to see Dr. Katina 7/30 to discuss aortic valve repair/replacement options, this has been rescheduled to 09/03 at [facility].  # Demand ischemia # Coronary artery disease s/p CABG x3 (04/2012) s/p recent stent (07/02/24) # Hypertension # Hyperlipidemia With recent LHC, s/p DES to SVG to PDA 07/02/2024. Reported some CP when he first came to hospital which has now resolved, no recurrence. Troponin trended 44 > 144 > 205 > 980 > 1862 > 5766 > 5756. Patient weaned off levo on 07/31, BP improving and stabilizing. Reports chest tightness with deep breathing, MSK tenderness on palpation, trops repeated (08/07), minimally elevated and flat 72 > 75 is demand ischemia, not ACS is setting of stated above hospital concerns. Repeat EKG (08/07) is without acute ischemic changes.   -Continue home plavix  75 mg daily. Continue aspirin  81 mg daily. Continue DAPT due to recent stent placement on 07/16. -Continue Crestor  20 mg daily for high-intensity statin management.  -Continue home metoprolol  succinate 12.5 mg daily.  -Will plan to resume home losartan , Imdur  as BP allows.  -Recommend MAP > 65.   # Acute on chronic anemia # Hx AVM Patient received blood transfusion on 07/28, 07/31, 08/04.  Hgb 8.0 > 8.6 >9.3 -Closely monitor H&H. -Continue PPIs. -Goal Hgb >8 due to cardiac history. Recommend transfusion when <8.  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 Continue DAPT due to recent stent placement.  -Management per primary team.    This patient's plan of care was discussed and created with Dr. Ammon and he is in agreement.  Signed: Trish Mancinelli, PA-C  07/28/2024, 10:06 AM [facility]

## 2024-07-28 NOTE — Progress Notes (Signed)
 Speech Language Pathology Treatment: Dysphagia  Patient Details Name: [name] MRN: [id number] DOB: August 07, 1961 Today's Date: 07/28/2024 Time: 8944-8894 SLP Time Calculation (min) (ACUTE ONLY): 10 min  Assessment / Plan / Recommendation Clinical Impression  Limited dysphagia intervention completed this date secondary to pt desire to remain reclined, side lying in bed. Pt remains on 2L nasal canula, afebrile, WBC 13.4. RN reporting poor PO intake. Pt indicating lack of appetite and distaste for hospital provisions. Education shared regarding need for PO intake and aspiration precautions (upright, alert, small/slow intake). Pt reported understanding, though continued to resist PO trials. Given gross medical stability and RN report of stability with limited intake since diet initiation- recommend continuation of current diet recommendation (dys 2 chopped and thin liquids). Supervision with intake to ensure upright and compliance with aspiration precautions (slow rate, small bites, elevated HOB, and alert for PO intake). SLP will monitor for continued SLP needs.    HPI HPI: per MD Progress Note, [name] is a 63 y.o. male with medical history significant of  GIB, alcohol  abuse, COPD on 2 L oxygen, hypertension, CAD with prior CABG and recent Stent placement,, severe aortic stenosis, HLD, stroke, dementia, chronic diastolic CHF, who presented to the hospital on 07/11/2024 with SOB, cough productive of yellowish sputum, sided chest pain.     Patient was recently hospitalized from 7/16 - 7/17 for elective LHC. Pt was s/p of successful direct stenting of distal SVG to PDA.     CTA negative for PE, showed patchy infiltration in the right upper lobe.  Patient is admitted to telemetry bed as inpatient.         He was admitted to the hospitalist service for COPD exacerbation and acute hypoxic respiratory failure.  He developed worsening respiratory distress shortly after admission.  He was placed on BiPAP and  was transferred to the ICU for further management.  He has a became agitated and this was attributed to acute alcohol  withdrawal syndrome.  He was started on IV Precedex  infusion for this.  He developed acute toxic metabolic encephalopathy and worsening respiratory failure likely from aspiration pneumonia.  He required intubation and mechanical ventilation in the ICU.  He became hypotensive with induction meds despite IV fluids and IV Levophed  infusion.  There was brief loss of pulse but ROSC was achieved after 2 minutes of CPR and 1 dose of epinephrine . DG Chest 07/18/24: Stable nodular opacity within the periphery of the right upper lobe, similar  to CT from 07/11/2024. Please refer to that report for follow-up  recommendations.  2. Mild asymmetric hazy opacification over the right mid and right lower lung,  possibly related to rotational artifact versus small posterior layering  effusion.  3. Mild subsegmental atelectasis in the left base.      SLP Plan  Continue with current plan of care          Recommendations  Diet recommendations: Dysphagia 2 (fine chop);Thin liquid Liquids provided via: Straw Medication Administration: Whole meds with puree Supervision: Staff to assist with self feeding;Full supervision/cueing for compensatory strategies Compensations: Minimize environmental distractions;Slow rate;Small sips/bites Postural Changes and/or Swallow Maneuvers: Seated upright 90 degrees;Upright 30-60 min after meal;Out of bed for meals                  Oral care BID   Frequent or constant Supervision/Assistance Dysphagia, pharyngeal phase (R13.13)     Continue with current plan of care    [place] Oryn Casanova Clapp, MS, CCC-SLP Speech Language Pathologist Rehab Services;  [facility] [place] [phone] (ascom)   [place] J Clapp  07/28/2024, 2:10 PM

## 2024-07-28 NOTE — Progress Notes (Signed)
 Progress Note    [name]  FMW:[id number] DOB: Nov 15, 1961  DOA: 07/11/2024 PCP: Osa Geralds, NP      Brief Narrative:    Medical records reviewed and are as summarized below:  [name] is a 63 y.o. male with medical history significant of  GIB, alcohol  abuse, COPD on 2 L oxygen, hypertension, CAD with prior CABG and recent Stent placement,, severe aortic stenosis, HLD, stroke, dementia, chronic diastolic CHF, who presented to the hospital on 07/11/2024 with SOB, cough productive of yellowish sputum, sided chest pain.   Patient was recently hospitalized from 7/16 - 7/17 for elective LHC. Pt was s/p of successful direct stenting of distal SVG to PDA.  CTA negative for PE, showed patchy infiltration in the right upper lobe.  Patient is admitted to telemetry bed as inpatient.    He was admitted to the hospitalist service for COPD exacerbation and acute hypoxic respiratory failure.  He developed worsening respiratory distress shortly after admission.  He was placed on BiPAP and was transferred to the ICU for further management.  He has a became agitated and this was attributed to acute alcohol  withdrawal syndrome.  He was started on IV Precedex  infusion for this. He developed acute toxic metabolic encephalopathy and worsening respiratory failure likely from aspiration pneumonia.  He required intubation and mechanical ventilation in the ICU.  He became hypotensive with induction meds despite IV fluids and IV Levophed  infusion.  There was brief loss of pulse but ROSC was achieved after 2 minutes of CPR and 1 dose of epinephrine .    Significant Hospital Events: Including procedures, antibiotic start and stop dates in addition to other pertinent events   7/25: admit with respiratory failure 7/26: clinically decompensated, worsening respiratory status initiated on BiPAP 7/27: on nasal cannula, reports anxiety and tremors consistent with EtOH withdrawal.  Received a total of 15  mg/kg of phenobarbital , however he remained agitated requiring low dose precedex  gtt.  7/28: Pt resting in bed no agitation on precedex  gtt @0 .5 mcg/min.  Orders placed to transfuse 1 unit of pRBC's hgb 7.2 goal hgb 8.0.  No signs of active bleeding  7/29: Pt remains encephalopathic with severe DT's requiring precedex  gtt.  NGT placed to initiate tube feeds and administer medications  7/30: Earlier this morning with vomiting, concern for aspiration event as now with increased WOB and increasing FiO2 requirements (up to 4L). Remains encephalopathic on Precedex .  Respiratory status continued to decline, progressed to agonal respirations, Required INTUBATION for airway protection.  Hypotensive with induction meds despite IV fluids and Levophed  resulting in brief loss of pulse (ROSC obtained after 2 minutes of CPR and 1 epi).  Central line placed due to vasopressors. 7/31: Overnight with episode on non-sustained SVT which converted spontaneously, remains on Amiodarone  gtt per Cardiology.  Nursing reported black tarry stools overnight, Hgb remains stable.   Levophed  weaned off, on minimal vent settings, will perform WUA and SBT as mental status allows.  With fever of 101 F overnight and WBC nearly doubled, tracheal aspirate results pending, will check blood cultures and UA, broaden to empiric Vancomycin  and Zosyn  pending culture results.   08/01:  No significant events reported overnight. Received 1 unit pRBC's overnight for Hgb 7.5 with appropriate response to 8.8 and has since remained stable with no reports of bleeding from nursing.  Fevers resolved, WBC improved with broadening to Zosyn  yesterday, cultures currently with no growth.  Weaned off levophed .  On minimal vent support, passed WUA and SBT,  EXTUBATED TO HHFNC.  Diuresed with 20 mg IV Lasix  x1 dose with extubation, needs aggressive pulmonary toilet. 8/6: Patient with worsening respiratory status and oxygen requirement this morning, appears more  congested.  Apparently concern of aspiration event yesterday evening when his NG tube was dislodged.  Ordered repeat chest x-ray and sputum culture, worsening leukocytosis and procalcitonin at 0.26.  Starting on cefepime , Mucomyst  and giving 1 dose of IV Lasix .  Currently on 10 L of oxygen 8/7: Worsening respiratory status and increased work of breathing overnight, patient was transferred back to stepdown and started on heated high flow at 70% FiO2. Tube feed was held.  Chest x-ray with unchanged interstitial opacities bilaterally, possibly persistent interstitial edema and underlying emphysematous changes.  Also shows similar appearing lateral right upper lobe zone nodular opacity.  Patient was started on IV diuresis along with extensive pulmonary toileting.  BNP increased to 3252, troponin at 72 with flat curve, MRSA PCR negative PCCM was reconsulted as patient is high risk for reintubation. Palliative care was also consulted. 8/8: Clinically improving as of improving oxygen requirement, sputum cultures with normal respiratory flora. Saw alert team started on dysphagia 2 diet with thin liquid.  Hemoglobin decreased to 7.7 so ordered another unit of PRBC as goal hemoglobin is above 8 due to recent NSTEMI.  Continuing IV diuresis 8/9: Slowly improving hypoxia, now on 4 L of oxygen, clinically improved volume status with slight increase in creatinine today, cardiology is planning to switch to p.o. Diamox  from tomorrow.  Worsening leukocytosis today and hemoglobin improved to 8.7 after getting 1 more unit of PRBC yesterday. 8/10: Hemodynamically stable, now weaned back to 2 L of oxygen, IV Lasix  has been switched with p.o.  Creatinine 1.39 today.  TOC to look for SNF 8/11: Remained stable on 2 L of oxygen and continue to slowly improve, acetazolamide  was discontinued and he will continue on p.o. Lasix .  Assessment/Plan:   Principal Problem:   Acute respiratory distress Active Problems:   COPD  exacerbation (HCC)   Lobar pneumonia (HCC)   Elevated lactic acid level   Metabolic acidosis, increased anion gap   Essential hypertension   Acute on chronic diastolic CHF (congestive heart failure) (HCC)   Hyperlipidemia   Stroke (HCC)   CAD (coronary artery disease)   Iron  deficiency anemia   Alcohol  abuse   Cognitive impairment   Myocardial injury   Nutrition Problem: Inadequate oral intake Etiology: lethargy/confusion  Signs/Symptoms: NPO status   Body mass index is 29.18 kg/m.  (Obesity)   Acute on chronic hypoxic respiratory failure: Improving Patient was able to wean to 2 L of oxygen from heated high flow. IV Lasix  has been switched with p.o. - Continue supplemental oxygen-wean as tolerated S/p extubation on 07/18/2024.  Was intubated on 07/16/2024.  Aspiration pneumonia: Another concern of aspiration yesterday evening with resultant worsening respiratory status. Worsening leukocytosis again.  PCT at 0.26  Completed IV Zosyn  on 07/21/2024. Suspected septic shock: Resolved.  He is off of IV Levophed  infusion.  Repeat chest x-ray with persistent opacities And another slight worsening of leukocytosis today-can be due to steroid. -Cefepime  was discontinued after negative respiratory flora.  Procalcitonin now negative. -Sputum culture-normal respiratory flora-MRSA PCR was negative - Mucomyst  nebulizer due to increased congestion and respiratory secretions. - Aggressive pulmonary toilet - Continue with supportive care  COPD exacerbation: Continue bronchodilators.   Patient was again started on Solu-Medrol  by PCCM. Switching Solu-Medrol  with prednisone  20 mg daily for next 3 days-continue with prednisone  for another  day  Acute toxic metabolic encephalopathy: Mental status is improving.  Continue supportive care Alcohol  use disorder with alcohol  withdrawal syndrome: He is off of IV Precedex  infusion.  Continue with thiamine .  Brief cardiac arrest due to severe hypotension  from induction meds on 07/16/2024 (ROSC achieved after 2 minutes of CPR and 1 dose of epinephrine ).  Acute NSTEMI, elevated troponins (peaked at 5,766), CAD, recent elective left heart cath s/p DES to SVG to PDA 07/02/2024,  history of CABG x 3 (May 2013): Continue aspirin , Plavix  and rosuvastatin .  Acute on chronic diastolic CHF, severe aortic stenosis:  2D echo on 05/10/2024 showed EF estimated at 60 to 65%, grade 1 diastolic dysfunction, severe aortic valve stenosis. Repeat BNP shows worsening and now at 3252 - Started on twice daily IV diuresis-now switched to p.o. - Daily weight and BNP - Strict intake and output  Chronic anemia, history of GI bleed: S/p transfusion with 4 unit of PRBCs on 07/25/2024, 07/21/2024, 7/28 , 07/17/2024 Hemoglobin stable at 8.7 today- No evidence of overt GI bleeding thus far.   Continue IV Protonix .  He was evaluated by gastroenterologist on 07/13/2024.  GI signed off. 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  Continue enteral nutrition.  Monitor BMP.  AKI: Resolved  Hyperglycemia: Glucose levels are stable.  Hemoglobin A1c was 5.3 on 10/28/2022   Comorbidities include hypertension, hyperlipidemia, history of stroke   Diet Order             DIET DYS 2 Room service appropriate? Yes with Assist; Fluid consistency: Thin  Diet effective now                  Consultants: Physiological scientist  Procedures: Intubated on 07/16/2024 and extubated on 07/18/2024 Left IJ central line placed on 07/16/2024  Medications:    amiodarone   200 mg Oral Daily   ascorbic acid   500 mg Oral Daily   aspirin   81 mg Oral Daily   budesonide  (PULMICORT ) nebulizer solution  0.5 mg Nebulization BID   clopidogrel   75  mg Oral Daily   dapagliflozin  propanediol  10 mg Oral Daily   enoxaparin  (LOVENOX ) injection  40 mg Subcutaneous QHS   feeding supplement  237 mL Oral TID BM   folic acid   1 mg Oral Daily   furosemide   40 mg Oral Daily   ipratropium-albuterol   3 mL Nebulization TID   iron  polysaccharides  150 mg Oral Daily   metoprolol  succinate  12.5 mg Oral Daily   multivitamin with minerals  1 tablet Oral Daily   nicotine   7 mg Transdermal Daily   mouth rinse  15 mL Mouth Rinse 4 times per day   pantoprazole   40 mg Oral BID AC   polyethylene glycol  17 g Oral Daily   predniSONE   20 mg Oral Q breakfast   rosuvastatin   20 mg Oral Daily   thiamine   100 mg Oral Daily   Vitamin D  (Ergocalciferol )  50,000 Units Oral Q7 days   Continuous Infusions:  Anti-infectives (From admission, onward)    Start     Dose/Rate Route Frequency Ordered Stop   07/23/24 1400  ceFEPIme  (MAXIPIME ) 2 g in sodium chloride  0.9 % 100 mL IVPB  Status:  Discontinued        2  g 200 mL/hr over 30 Minutes Intravenous Every 8 hours 07/23/24 1203 07/24/24 1028   07/17/24 1000  piperacillin -tazobactam (ZOSYN ) IVPB 3.375 g  Status:  Discontinued        3.375 g 12.5 mL/hr over 240 Minutes Intravenous Every 8 hours 07/17/24 0811 07/21/24 1100   07/17/24 1000  vancomycin  (VANCOREADY) IVPB 2000 mg/400 mL  Status:  Discontinued        2,000 mg 200 mL/hr over 120 Minutes Intravenous  Once 07/17/24 0811 07/17/24 1026   07/14/24 1200  cefTRIAXone  (ROCEPHIN ) 2 g in sodium chloride  0.9 % 100 mL IVPB        2 g 200 mL/hr over 30 Minutes Intravenous Every 24 hours 07/14/24 1030 07/16/24 1204   07/12/24 1600  piperacillin -tazobactam (ZOSYN ) IVPB 3.375 g  Status:  Discontinued        3.375 g 12.5 mL/hr over 240 Minutes Intravenous Every 8 hours 07/12/24 1503 07/14/24 1030   07/12/24 0900  vancomycin  (VANCOREADY) IVPB 1250 mg/250 mL  Status:  Discontinued        1,250 mg 166.7 mL/hr over 90 Minutes Intravenous Every 12 hours 07/11/24 2012  07/12/24 1458   07/11/24 2100  ceFEPIme  (MAXIPIME ) 2 g in sodium chloride  0.9 % 100 mL IVPB  Status:  Discontinued        2 g 200 mL/hr over 30 Minutes Intravenous Every 8 hours 07/11/24 1959 07/12/24 1458   07/11/24 2100  vancomycin  (VANCOREADY) IVPB 2000 mg/400 mL        2,000 mg 200 mL/hr over 120 Minutes Intravenous  Once 07/11/24 2004 07/11/24 2255   07/11/24 1945  cefTRIAXone  (ROCEPHIN ) 1 g in sodium chloride  0.9 % 100 mL IVPB  Status:  Discontinued        1 g 200 mL/hr over 30 Minutes Intravenous  Once 07/11/24 1930 07/11/24 1933   07/11/24 1945  azithromycin  (ZITHROMAX ) 500 mg in sodium chloride  0.9 % 250 mL IVPB  Status:  Discontinued        500 mg 250 mL/hr over 60 Minutes Intravenous  Once 07/11/24 1930 07/11/24 1955   07/11/24 1945  cefTRIAXone  (ROCEPHIN ) 2 g in sodium chloride  0.9 % 100 mL IVPB  Status:  Discontinued        2 g 200 mL/hr over 30 Minutes Intravenous  Once 07/11/24 1933 07/11/24 1955       Family Communication/Anticipated D/C date and plan/Code Status    DVT prophylaxis: Lovenox      Code Status: Full Code  Family Communication: Discussed with patient  Disposition Plan: Plan to discharge to SNF   Status is: Inpatient Remains inpatient appropriate because: Acute respiratory failure, aspiration pneumonia   Subjective:  Patient was resting comfortably when seen today.  Feeling much improved, denies any pain or shortness of breath.  Objective:    Vitals:   07/28/24 0737 07/28/24 0747 07/28/24 1200 07/28/24 1344  BP: 126/73  107/63   Pulse: 82  81   Resp: 19  20   Temp: 98.1 F (36.7 C)  98 F (36.7 C)   TempSrc:   Oral   SpO2: 99% 97% 99% 98%  Weight:      Height:       No data found.   Intake/Output Summary (Last 24 hours) at 07/28/2024 1356 Last data filed at 07/28/2024 1339 Gross per 24 hour  Intake 1560 ml  Output 440 ml  Net 1120 ml   Filed Weights   07/25/24 0500 07/27/24 0500 07/28/24 0500  Weight: 101.8 kg  97.1 kg 97.6  kg    Exam: General.  Frail gentleman, in no acute distress. Pulmonary.  Lungs clear bilaterally, normal respiratory effort. CV.  Regular rate and rhythm, no JVD, rub or murmur. Abdomen.  Soft, nontender, nondistended, BS positive. CNS.  Alert and oriented .  No focal neurologic deficit. Extremities.  No edema, no cyanosis, pulses intact and symmetrical.    Data Reviewed:   I have personally reviewed following labs and imaging studies:  Labs: Labs show the following:   Basic Metabolic Panel: Recent Labs  Lab 07/23/24 0457 07/24/24 0406 07/25/24 0439 07/25/24 1334 07/26/24 0426 07/26/24 1353 07/27/24 0536 07/28/24 0615  NA 142 141 144 140 137 135 138 138  K 4.1 4.3 4.8 4.2 3.7 3.6 3.2* 3.6  CL 101 99 99 95* 94* 95* 97* 99  CO2 32 33* 34* 31 32 30 27 29   GLUCOSE 138* 160* 125* 117* 170* 166* 115* 118*  BUN 37* 48* 54* 55* 53* 49* 41* 35*  CREATININE 1.12 1.05 1.15 1.20 1.38* 1.27* 1.39* 1.32*  CALCIUM  8.6* 8.7* 8.5* 8.5* 8.6* 8.5* 8.8* 9.5  MG 2.7*  --  2.9*  --  2.4  --  2.0 1.9  PHOS 4.6 4.9* 5.0*  --  4.0  --   --   --    GFR Estimated Creatinine Clearance: 67.6 mL/min (A) (by C-G formula based on SCr of 1.32 mg/dL (H)). Liver Function Tests: Recent Labs  Lab 07/24/24 0406  ALBUMIN  2.9*   No results for input(s): LIPASE, AMYLASE in the last 168 hours. No results for input(s): AMMONIA in the last 168 hours.  Coagulation profile No results for input(s): INR, PROTIME in the last 168 hours.  CBC: Recent Labs  Lab 07/23/24 0457 07/24/24 0406 07/25/24 0439 07/26/24 0426 07/27/24 0535  WBC 18.8* 15.7* 10.5 16.1* 13.4*  HGB 9.3* 8.1* 7.7* 8.7* 8.7*  HCT 31.0* 27.5* 26.2* 28.3* 28.4*  MCV 92.5 93.9 95.6 93.4 93.7  PLT 247 246 231 274 251   Cardiac Enzymes: No results for input(s): CKTOTAL, CKMB, CKMBINDEX, TROPONINI in the last 168 hours. BNP (last 3 results) No results for input(s): PROBNP in the last 8760 hours. CBG: Recent Labs   Lab 07/25/24 0813 07/25/24 1149 07/25/24 1615 07/26/24 0738 07/26/24 1254  GLUCAP 102* 127* 148* 128* 158*   D-Dimer: No results for input(s): DDIMER in the last 72 hours. Hgb A1c: No results for input(s): HGBA1C in the last 72 hours. Lipid Profile: No results for input(s): CHOL, HDL, LDLCALC, TRIG, CHOLHDL, LDLDIRECT in the last 72 hours.  Thyroid function studies: No results for input(s): TSH, T4TOTAL, T3FREE, THYROIDAB in the last 72 hours.  Invalid input(s): FREET3 Anemia work up: No results for input(s): VITAMINB12, FOLATE, FERRITIN, TIBC, IRON , RETICCTPCT in the last 72 hours. Sepsis Labs: Recent Labs  Lab 07/23/24 0457 07/24/24 0406 07/25/24 0439 07/26/24 0426 07/27/24 0535  PROCALCITON 0.26  --   --   --  <0.10  WBC 18.8* 15.7* 10.5 16.1* 13.4*    Microbiology Recent Results (from the past 240 hours)  Culture, Respiratory w Gram Stain     Status: None   Collection Time: 07/23/24 11:32 AM   Specimen: Sputum  Result Value Ref Range Status   Specimen Description   Final    SPU Performed at [facility], [address], [place], [place] [address]    Special Requests   Final    NONE Performed at [facility], [address] [place],  [place] [address]    Gram Stain   Final    RARE WBC PRESENT, PREDOMINANTLY PMN RARE GRAM POSITIVE COCCI    Culture   Final    Normal respiratory flora-no Staph aureus or Pseudomonas seen Performed at [facility], [address] [place], [place] [address]    Report Status 07/25/2024 FINAL  Final  MRSA Next Gen by PCR, Nasal     Status: None   Collection Time: 07/24/24 11:08 AM   Specimen: Nasal Mucosa; Nasal Swab  Result Value Ref Range Status   MRSA by PCR Next Gen NOT DETECTED NOT DETECTED Final    Comment: (NOTE) The GeneXpert MRSA Assay (FDA approved for NASAL specimens only), is one component of a comprehensive MRSA colonization surveillance program.  It is not intended to diagnose MRSA infection nor to guide or monitor treatment for MRSA infections. Test performance is not FDA approved in patients less than 14 years old. Performed at [facility], [address] [place], [place] [address]     Procedures and diagnostic studies:  No results found.    LOS: 17 days   Amaryllis Dare MD  [facility]   Pager on [email or web address]. If 7PM-7AM, please contact night-coverage at [email or web address]  07/28/2024, 1:56 PM

## 2024-07-28 NOTE — Progress Notes (Signed)
 Occupational Therapy Treatment Patient Details Name: [name] MRN: [id number] DOB: 07/04/1961 Today's Date: 07/28/2024   History of present illness 63 year old male patient admitted to the ICU with severe alcohol  withdrawal Dts requiring multiple doses of Phenobarb and eventually precedex  drip. Unfortunately course complicated by an aspriation event requiring intubation and mechanical ventilation with course c/b peri intubation brief cardiac arrest requiring one round of CPR.  Pt ultimately needing to be extubated ~24 hours.   OT comments  Pt seen for OT treatment on this date. Upon arrival to room pt seated in recliner with CSW in room, pt agreeable to tx. Pt requires verbal encouragement to participate in therapy session. Pt STS from recliner with CGA with very minimal effort, pt required verbal cues for technique. Pt took ~3 steps to the sink where he completed sink level grooming tasks, requiring assistance to setup of tooth brush and CGA for safety throughout. Pt often leaned on bilateral forearms during standing task, pt reported feel too fatigued on this date to participate in further mobility. Pt returned to recliner with CGA leaving the RW at the sink despite multimodal cues, pt impulsively returned to sitting. Pt educated on the need to pace himself and use DME for his safety during mobility/transfers in the future. Pt making good progress toward goals, will continue to follow POC. Discharge recommendation remains appropriate.        If plan is discharge home, recommend the following:  A lot of help with walking and/or transfers;A lot of help with bathing/dressing/bathroom;Assistance with cooking/housework;Direct supervision/assist for medications management;Assist for transportation   Equipment Recommendations  Other (comment)    Recommendations for Other Services      Precautions / Restrictions Precautions Precautions: Fall Recall of Precautions/Restrictions:  Intact Restrictions Weight Bearing Restrictions Per Provider Order: No       Mobility Bed Mobility               General bed mobility comments: NT in recliner pre/post session    Transfers Overall transfer level: Needs assistance Equipment used: Rolling walker (2 wheels) Transfers: Sit to/from Stand Sit to Stand: Contact guard assist           General transfer comment: CGA for STS from recliner with cues for technique     Balance Overall balance assessment: Needs assistance Sitting-balance support: Feet supported Sitting balance-Leahy Scale: Good     Standing balance support: During functional activity, Bilateral upper extremity supported Standing balance-Leahy Scale: Fair Standing balance comment: Heavy reliant on RW due to limited activity tolerance                           ADL either performed or assessed with clinical judgement   ADL Overall ADL's : Needs assistance/impaired     Grooming: Wash/dry face;Wash/dry hands;Oral care;Standing;Minimal assistance Grooming Details (indicate cue type and reason): sink level ADL task                 Toilet Transfer: Ambulation;Rolling walker (2 wheels);Contact guard assist Toilet Transfer Details (indicate cue type and reason): Simulated toilet t/f         Functional mobility during ADLs: Rolling walker (2 wheels);Contact guard assist General ADL Comments: Sink level ADL grooming task    Communication Communication Communication: Impaired Factors Affecting Communication: Hearing impaired   Cognition Arousal: Alert Behavior During Therapy: WFL for tasks assessed/performed Cognition: Cognition impaired   Orientation impairments: Situation Awareness: Intellectual awareness impaired, Online awareness impaired Memory  impairment (select all impairments): Short-term memory Attention impairment (select first level of impairment): Sustained attention Executive functioning impairment (select all  impairments): Reasoning, Problem solving, Sequencing OT - Cognition Comments: A/Ox3, Slow processing                 Following commands: Intact Following commands impaired: Follows one step commands with increased time      Cueing   Cueing Techniques: Verbal cues, Gestural cues, Tactile cues  Exercises Exercises: Other exercises Other Exercises Other Exercises: Edu: Role of OT, benefits of OOB activity, DME mangagement           General Comments CSW in room on arrival discussing rehab    Pertinent Vitals/ Pain       Pain Assessment Pain Assessment: No/denies pain   Frequency  Min 2X/week        Progress Toward Goals  OT Goals(current goals can now be found in the care plan section)  Progress towards OT goals: Progressing toward goals  Acute Rehab OT Goals OT Goal Formulation: With patient Time For Goal Achievement: 08/02/24 Potential to Achieve Goals: Good ADL Goals Pt Will Perform Lower Body Bathing: with min assist Pt Will Perform Lower Body Dressing: with min assist Pt Will Transfer to Toilet: with min assist Additional ADL Goal #1: Pt will participate in functional cognitve assessment to assist with improved IADL mgmt at home   AM-PAC OT 6 Clicks Daily Activity     Outcome Measure   Help from another person eating meals?: None Help from another person taking care of personal grooming?: A Little Help from another person toileting, which includes using toliet, bedpan, or urinal?: A Lot Help from another person bathing (including washing, rinsing, drying)?: A Lot Help from another person to put on and taking off regular upper body clothing?: A Little Help from another person to put on and taking off regular lower body clothing?: A Little 6 Click Score: 17    End of Session Equipment Utilized During Treatment: Gait belt;Rolling walker (2 wheels);Oxygen  OT Visit Diagnosis: Muscle weakness (generalized) (M62.81);History of falling (Z91.81)    Activity Tolerance Patient tolerated treatment well   Patient Left in chair;with call bell/phone within reach;with chair alarm set   Nurse Communication Mobility status        Time: 8470-8451 OT Time Calculation (min): 19 min  Charges: OT General Charges $OT Visit: 1 Visit OT Treatments $Self Care/Home Management : 8-22 mins  Larraine Colas M.S. OTR/L  07/28/24, 3:59 PM

## 2024-07-28 NOTE — Progress Notes (Signed)
 Daily Progress Note   Patient Name: [name]       Date: 07/28/2024 DOB: 04-02-1961  Age: 63 y.o. MRN#: [id number] Attending Physician: Caleen Qualia, MD Primary Care Physician: Osa Geralds, NP Admit Date: 07/11/2024  Reason for Consultation/Follow-up: Establishing goals of care  HPI/Brief Hospital Review: 63 y.o. male  with past medical history of GIB (04/2024), ETOH abuse, COPD with chronic respiratory failure on 2L [place] at baseline, CAD s/p CABG and recent stent placement s/p LHC (07/02/2024), severe aortic stenosis, CVA, dementia, CHF and HLD admitted from home on 07/11/2024 with shortness of breath and productive cough.   Admitted for COPD exacerbation and acute hypoxic respiratory failure, admission complicated by ETOH withdrawal requiring phenobarbital  and precedex , suffered aspiration event 7/30--led to brief cardiac arrest (x2 minutes prior to ROSC) and required intubation-extubated 8/1--8/6 required HHFNC, has since been weaned to [facility]   Palliative medicine was consulted for assisting with goals of care conversations.  Subjective: Extensive chart review has been completed prior to meeting patient including labs, vital signs, imaging, progress notes, orders, and available advanced directive documents from current and previous encounters.    Visited with [name] at his bedside. He is awake, alert and able to engage in conversation, he is sitting up in recliner. He reports feeling well today without acute complaints. He is more oriented today and aware of person, place and time. He remains unclear on events that have occurred during hospitalization. We again reviewed events including ETOH withdrawal, aspiration event, brief cardiac arrest and requiring a few days on mechanical  ventilation.  Assessed symptoms, he denies acute pain or discomfort. Noted abdominal distention-no tenderness. Last documented bowel movement about 6 days prior--ordered bowel regimen.  Attempted to elicit goals of care. Today, [name] shares he is unsure if he would be accepting of resuscitative efforts again, but when discussing he shares this is due to the condition his body is in currently referring to bruising on his arms. Unclear if he has a full understanding of complex medical discussions. We also discussed completion of Advanced Directives and establishing HCPOA-he again shares he is unsure if he has anyone in his life that would assume that responsibility. He mentions his friend-[name] but unsure if she would be comfortable. Encouraged him to have conversations with [name]. Advanced Directive documents left at bedside.  PMT to step away from daily visits, will remain available peripherally for needs or concerns as they arise.  Objective:  Physical Exam Constitutional:      General: He is not in acute distress.    Appearance: He is ill-appearing.  Pulmonary:     Effort: Pulmonary effort is normal. No respiratory distress.  Neurological:     Mental Status: He is alert.             Vital Signs: BP 107/63 (BP Location: Right Arm)   Pulse 81   Temp 98 F (36.7 C) (Oral)   Resp 20   Ht 6' (1.829 m)   Wt 97.6 kg   SpO2 98%   BMI 29.18 kg/m  SpO2: SpO2: 98 % O2 Device: O2 Device: Nasal Cannula O2 Flow Rate: O2 Flow Rate (L/min): 2 L/min   Palliative Care Assessment & Plan   Assessment/Recommendation/Plan  Continue with current plan of care Established bowel regimen  Thank you for allowing the Palliative Medicine Team to assist in the care of this patient.  Total time:  35 minutes  Time spent includes: Detailed review of medical records (labs, imaging, vital signs), medically appropriate exam (mental status, respiratory, cardiac, skin), discussed with treatment  team, counseling and educating patient, family and staff, documenting clinical information, medication management and coordination of care.  Waddell Lesches, DNP, AGNP-C Palliative Medicine   Please contact Palliative Medicine Team phone at [phone] for questions and concerns.

## 2024-07-29 ENCOUNTER — Inpatient Hospital Stay

## 2024-07-29 DIAGNOSIS — Z515 Encounter for palliative care: Secondary | ICD-10-CM | POA: Diagnosis not present

## 2024-07-29 DIAGNOSIS — J441 Chronic obstructive pulmonary disease with (acute) exacerbation: Secondary | ICD-10-CM | POA: Diagnosis not present

## 2024-07-29 DIAGNOSIS — R0603 Acute respiratory distress: Secondary | ICD-10-CM | POA: Diagnosis not present

## 2024-07-29 DIAGNOSIS — J189 Pneumonia, unspecified organism: Secondary | ICD-10-CM | POA: Diagnosis not present

## 2024-07-29 LAB — BASIC METABOLIC PANEL WITH GFR
Anion gap: 13 (ref 5–15)
BUN: 34 mg/dL — ABNORMAL HIGH (ref 8–23)
CO2: 27 mmol/L (ref 22–32)
Calcium: 9 mg/dL (ref 8.9–10.3)
Chloride: 96 mmol/L — ABNORMAL LOW (ref 98–111)
Creatinine, Ser: 1.46 mg/dL — ABNORMAL HIGH (ref 0.61–1.24)
GFR, Estimated: 53 mL/min — ABNORMAL LOW (ref >60)
Glucose, Bld: 102 mg/dL — ABNORMAL HIGH (ref 70–99)
Potassium: 3.6 mmol/L (ref 3.5–5.1)
Sodium: 136 mmol/L (ref 135–145)

## 2024-07-29 LAB — BRAIN NATRIURETIC PEPTIDE: B Natriuretic Peptide: 977.9 pg/mL — ABNORMAL HIGH (ref 0.0–100.0)

## 2024-07-29 MED ORDER — SALINE SPRAY 0.65 % NA SOLN
1.0000 | NASAL | Status: DC | PRN
  Administered 2024-07-30 (×2): 1 via NASAL
  Filled 2024-07-29: qty 44

## 2024-07-29 MED ORDER — SODIUM CHLORIDE 0.9 % IV BOLUS: 250.0000 mL | Freq: Once | INTRAVENOUS | Status: DC

## 2024-07-29 MED ORDER — FUROSEMIDE 10 MG/ML IJ SOLN: 40.0000 mg | Freq: Two times a day (BID) | INTRAMUSCULAR | Status: DC

## 2024-07-29 MED ORDER — POTASSIUM CHLORIDE CRYS ER 20 MEQ PO TBCR
40.0000 meq | EXTENDED_RELEASE_TABLET | Freq: Once | ORAL | Status: AC
  Administered 2024-07-29 (×2): 40 meq via ORAL
  Filled 2024-07-29: qty 2

## 2024-07-29 MED ORDER — FUROSEMIDE 20 MG PO TABS: 20.0000 mg | ORAL_TABLET | Freq: Every day | ORAL | Status: DC

## 2024-07-29 MED ORDER — FUROSEMIDE 10 MG/ML IJ SOLN
40.0000 mg | Freq: Two times a day (BID) | INTRAMUSCULAR | Status: DC
  Administered 2024-07-29 (×2): 40 mg via INTRAVENOUS
  Filled 2024-07-29: qty 4

## 2024-07-29 MED ORDER — FUROSEMIDE 10 MG/ML IJ SOLN
40.0000 mg | Freq: Two times a day (BID) | INTRAMUSCULAR | Status: AC
  Administered 2024-07-29 (×2): 40 mg via INTRAVENOUS
  Filled 2024-07-29: qty 4

## 2024-07-29 MED ORDER — ALBUMIN HUMAN 25 % IV SOLN
25.0000 g | Freq: Once | INTRAVENOUS | Status: AC
  Administered 2024-07-29 (×2): 25 g via INTRAVENOUS
  Filled 2024-07-29: qty 100

## 2024-07-29 NOTE — Progress Notes (Addendum)
 [facility] CLINIC CARDIOLOGY PROGRESS NOTE       Patient ID: [name] MRN: [id number] DOB/AGE: 04/24/1961 63 y.o.  Admit date: 07/11/2024 Referring Physician Dr. Charlie Sellar Primary Physician Osa Geralds, NP  Primary Cardiologist Tinnie Maiden, NP  Reason for Consultation elevated troponins  HPI: [name] is a 63 y.o. 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 Found to have pneumonia, overnight troponins uptrending. Cardiology was consulted for further evaluation.   Interval history: -Patient seen and examined this AM, resting in bedside chair in no acute distress. Patient states breathing feels okay and states he still feels SOB and reports abdominal bloating, decreased appetite. Denies chest pain/pressure. -Patient states he didn't sleep well. -Patient BP borderline and HR stable this AM. No significant events on telemetry.  -CXR with worsening pleural effusions. BNP elevated 900 but improving.  -Worsening renal function this AM. -Pending SNF placement   Review of systems complete and found to be negative unless listed above    Past Medical History:  Diagnosis Date   COPD (chronic obstructive pulmonary disease) (HCC)    CVA (cerebral infarction)    Headache    mild, since stroke 2012   Hypertension    MI (myocardial infarction) (HCC) 05/09/2012   Reading difficulty    pt reports he reads at about a second grade level   Stroke [facility]) 2012   numbness to left hand    Wears dentures    full upper and lower    Past Surgical History:  Procedure Laterality Date   CAROTID ENDARTERECTOMY Left    2012 or 2013   CHOLECYSTECTOMY  N/A 07/26/2016   Procedure: LAPAROSCOPIC CHOLECYSTECTOMY;  Surgeon: Charlie FORBES Fell, MD;  Location: [facility] ORS;  Service: General;  Laterality: N/A;   COLONOSCOPY N/A 05/08/2024   Procedure: COLONOSCOPY;  Surgeon: Jinny Carmine, MD;  Location: [facility] ENDOSCOPY;  Service: Endoscopy;  Laterality: N/A;   COLONOSCOPY WITH PROPOFOL  N/A 02/09/2022   Procedure: COLONOSCOPY WITH PROPOFOL ;  Surgeon: Jinny Carmine, MD;  Location: [facility] ENDOSCOPY;  Service: Endoscopy;  Laterality: N/A;   CORONARY ARTERY BYPASS GRAFT  05/10/2012   3 vessel   CORONARY STENT INTERVENTION N/A 07/02/2024   Procedure: CORONARY STENT INTERVENTION;  Surgeon: Katina Albright, MD;  Location: [facility] INVASIVE CV LAB;  Service: Cardiovascular;  Laterality: N/A;   CORONARY ULTRASOUND/IVUS N/A 07/02/2024   Procedure: Coronary Ultrasound/IVUS;  Surgeon: Katina Albright, MD;  Location: [facility] INVASIVE CV LAB;  Service: Cardiovascular;  Laterality: N/A;   ENTEROSCOPY N/A 05/16/2023   Procedure: ENTEROSCOPY;  Surgeon: Therisa Bi, MD;  Location: [facility] ENDOSCOPY;  Service: Gastroenterology;  Laterality: N/A;   ENTEROSCOPY N/A 06/01/2023   Procedure: ENTEROSCOPY;  Surgeon: Unk Corinn Skiff, MD;  Location: [facility] ENDOSCOPY;  Service: Gastroenterology;  Laterality: N/A;   ESOPHAGOGASTRODUODENOSCOPY  05/13/2023   Procedure: ESOPHAGOGASTRODUODENOSCOPY (EGD);  Surgeon: Jinny Carmine, MD;  Location: [facility] ENDOSCOPY;  Service: Endoscopy;;   ESOPHAGOGASTRODUODENOSCOPY (EGD) WITH PROPOFOL  N/A 10/28/2022   Procedure: ESOPHAGOGASTRODUODENOSCOPY (EGD) WITH PROPOFOL ;  Surgeon: Onita Elspeth Sharper, DO;  Location: [facility] ENDOSCOPY;  Service: Gastroenterology;  Laterality: N/A;   GIVENS CAPSULE STUDY  05/13/2023   Procedure: GIVENS CAPSULE STUDY;  Surgeon: Jinny Carmine, MD;  Location: [facility] ENDOSCOPY;  Service: Endoscopy;;   GIVENS CAPSULE STUDY  06/01/2023   Procedure: GIVENS CAPSULE STUDY;  Surgeon: Unk Corinn Skiff, MD;  Location: [facility] ENDOSCOPY;  Service: Gastroenterology;;   LEFT  HEART CATH AND CORS/GRAFTS ANGIOGRAPHY N/A 07/02/2024   Procedure: LEFT HEART CATH AND CORS/GRAFTS ANGIOGRAPHY;  Surgeon: Katina Albright, MD;  Location: [facility] INVASIVE CV LAB;  Service: Cardiovascular;  Laterality: N/A;    Medications Prior to Admission  Medication Sig Dispense Refill Last Dose/Taking   albuterol  (VENTOLIN  HFA) 108 (90 Base) MCG/ACT inhaler Inhale 2 puffs into the lungs every 6 (six) hours as needed for wheezing or shortness of breath. 6.7 g 2 07/11/2024 Morning   ascorbic acid  (VITAMIN C ) 500 MG tablet Take 1 tablet (500 mg total) by mouth daily. 30 tablet 2 07/11/2024 Morning   aspirin  EC 81 MG tablet Take 1 tablet (81 mg total) by mouth daily. Swallow whole. 30 tablet 5 07/11/2024 Morning   clopidogrel  (PLAVIX ) 75 MG tablet Take 1 tablet (75 mg total) by mouth daily with breakfast. 30 tablet 11 07/11/2024 Morning   dapagliflozin  propanediol (FARXIGA ) 10 MG TABS tablet Take 1 tablet (10 mg total) by mouth daily. 30 tablet 11 07/11/2024 Morning   folic acid  (FOLVITE ) 1 MG tablet Take 1 tablet (1 mg total) by mouth daily. 30 tablet 2 07/11/2024   iron  polysaccharides (NIFEREX) 150 MG capsule Take 1 capsule (150 mg total) by mouth daily. 30 capsule 2 07/11/2024   isosorbide  mononitrate (IMDUR ) 60 MG 24 hr tablet Take 1 tablet (60 mg total) by mouth daily. 30 tablet 11 07/11/2024   losartan  (COZAAR ) 25 MG tablet Take 0.5 tablets (12.5 mg total) by mouth daily. 30 tablet 11 07/11/2024 Morning   metoprolol  succinate (TOPROL -XL) 25 MG 24 hr tablet Take 0.5 tablets (12.5 mg total) by mouth daily. 30 tablet 11 07/11/2024 Morning   pantoprazole  (PROTONIX ) 40 MG tablet Take 1 tablet (40 mg total) by mouth daily. 30 tablet 2 07/11/2024 Morning   pravastatin  (PRAVACHOL ) 20 MG tablet Take 1 tablet (20 mg total) by mouth at bedtime. 30 tablet 11 07/10/2024 Evening   spironolactone  (ALDACTONE ) 25 MG tablet Take 0.5 tablets (12.5 mg total) by mouth daily. 30 tablet 11 07/11/2024 Morning   torsemide  (DEMADEX ) 10 MG  tablet Take 1 tablet (10 mg total) by mouth daily. 30 tablet 11 07/11/2024   Social History   Socioeconomic History   Marital status: Divorced    Spouse name: Not on file   Number of children: Not on file   Years of education: Not on file   Highest education level: Not on file  Occupational History   Not on file  Tobacco Use   Smoking status: Former    Current packs/day: 0.00    Types: Cigarettes    Quit date: 50    Years since quitting: 32.6   Smokeless tobacco: Current    Types: Snuff   Tobacco comments:    changed to dip  Vaping Use   Vaping status: Never Used  Substance and Sexual Activity   Alcohol  use: Yes    Alcohol /week: 26.0 standard drinks of alcohol     Types: 26 Cans of beer per week   Drug use: No   Sexual activity: Yes    Birth control/protection: None  Other Topics Concern   Not on file  Social History Narrative   Not on file   Social Drivers of Health   Financial Resource Strain: Not on file  Food Insecurity: No Food Insecurity (  07/12/2024)   Hunger Vital Sign    Worried About Running Out of Food in the Last Year: Never true    Ran Out of Food in the Last Year: Never true  Transportation Needs: No Transportation Needs (07/12/2024)   PRAPARE - Administrator, Civil Service (Medical): No    Lack of Transportation (Non-Medical): No  Physical Activity: Not on file  Stress: Not on file  Social Connections: Socially Isolated (07/12/2024)   Social Connection and Isolation Panel    Frequency of Communication with Friends and Family: Once a week    Frequency of Social Gatherings with Friends and Family: Once a week    Attends Religious Services: Never    Database administrator or Organizations: No    Attends Banker Meetings: Never    Marital Status: Divorced  Catering manager Violence: Not At Risk (07/12/2024)   Humiliation, Afraid, Rape, and Kick questionnaire    Fear of Current or Ex-Partner: No    Emotionally Abused: No     Physically Abused: No    Sexually Abused: No    Family History  Problem Relation Age of Onset   Pneumonia Mother      Vitals:   07/29/24 0410 07/29/24 0500 07/29/24 0717 07/29/24 0808  BP: (!) 115/59   105/60  Pulse: 72   69  Resp: 20     Temp: 97.9 F (36.6 C)   97.9 F (36.6 C)  TempSrc:    Oral  SpO2: 100%  99% 100%  Weight:  97.1 kg    Height:        PHYSICAL EXAM General: Ill appearing male, no acute distress. HEENT: Normocephalic and atraumatic. Neck: No JVD.  Lungs: Normal respiratory effort.  Diminished breath sounds bilaterally with rhonchi. Heart: HRRR, Normal S1 and S2 without gallops. +systolic ejection murmur.   Abdomen: Non-distended appearing.  Msk: Normal strength and tone for age. Extremities: Warm and well perfused. No clubbing, cyanosis, edema.   Labs: Basic Metabolic Panel: Recent Labs    07/27/24 0536 07/28/24 0615 07/29/24 0324  NA 138 138 136  K 3.2* 3.6 3.6  CL 97* 99 96*  CO2 27 29 27   GLUCOSE 115* 118* 102*  BUN 41* 35* 34*  CREATININE 1.39* 1.32* 1.46*  CALCIUM  8.8* 9.5 9.0  MG 2.0 1.9  --    Liver Function Tests: No results for input(s): AST, ALT, ALKPHOS, BILITOT, PROT, ALBUMIN  in the last 72 hours.  No results for input(s): LIPASE, AMYLASE in the last 72 hours. CBC: Recent Labs    07/27/24 0535  WBC 13.4*  HGB 8.7*  HCT 28.4*  MCV 93.7  PLT 251   Cardiac Enzymes: No results for input(s): CKTOTAL, CKMB, CKMBINDEX, TROPONINIHS in the last 72 hours.    BNP: Recent Labs    07/29/24 0752  BNP 977.9*     D-Dimer: No results for input(s): DDIMER in the last 72 hours. Hemoglobin A1C: No results for input(s): HGBA1C in the last 72 hours. Fasting Lipid Panel: No results for input(s): CHOL, HDL, LDLCALC, TRIG, CHOLHDL, LDLDIRECT in the last 72 hours.  Thyroid Function Tests: No results for input(s): TSH, T4TOTAL, T3FREE, THYROIDAB in the last 72 hours.  Invalid  input(s): FREET3 Anemia Panel: No results for input(s): VITAMINB12, FOLATE, FERRITIN, TIBC, IRON , RETICCTPCT in the last 72 hours.   Radiology: [facility] Chest Port 1 View Result Date: 07/29/2024 CLINICAL DATA:  Congestive heart failure. EXAM: PORTABLE CHEST 1 VIEW COMPARISON:  07/24/2024 FINDINGS:  The enteric tube has been removed. Post median sternotomy with stable cardiomegaly. Bilateral pleural effusions and bibasilar opacities, slightly worsened in the interim. Improved interstitial opacities with mild residual. Underlying emphysematous change. No pneumothorax. IMPRESSION: 1. Bilateral pleural effusions and bibasilar opacities, slightly worsened in the interim. 2. Improved interstitial opacities with mild residual. Electronically Signed   By: Andrea Gasman M.D.   On: 07/29/2024 09:53   DG Chest 1 View Result Date: 07/24/2024 CLINICAL DATA:  [id number] Dyspnea [id number] EXAM: CHEST  1 VIEW COMPARISON:  July 23, 2024, July 11, 2024 FINDINGS: Esophagogastric tube terminates below the diaphragm, outside the field of view. Diffuse interstitial opacities throughout both lungs. No lobar consolidation or pneumothorax. Lateral right upper lobe nodular opacity measures 1.9 cm, corresponding to the lung nodule on the prior chest CT. Moderate cardiomegaly. Sternotomy wires and CABG markers. No acute fracture or destructive lesions. Multilevel thoracic osteophytosis. IMPRESSION: 1. Moderate cardiomegaly. Unchanged interstitial opacities throughout both lungs, possibly persistent interstitial edema and underlying emphysematous changes. 2. Similarly appearing lateral right upper lung zone nodular opacity. Please see the comparison chest CT for further characterization. Electronically Signed   By: Rogelia Myers M.D.   On: 07/24/2024 08:43   DG Chest Port 1 View Result Date: 07/23/2024 CLINICAL DATA:  Shortness of breath. EXAM: PORTABLE CHEST 1 VIEW COMPARISON:  July 22, 2024. FINDINGS: The heart size and  mediastinal contours are within normal limits. Status post coronary bypass graft. Mild diffuse interstitial densities are noted concerning for possible pulmonary edema with possible small pleural effusions. Nasogastric tube is seen entering stomach. The visualized skeletal structures are unremarkable. IMPRESSION: Mild diffuse interstitial densities are noted concerning for possible pulmonary edema with small pleural effusions. Electronically Signed   By: Lynwood Landy Raddle M.D.   On: 07/23/2024 13:03   DG ABD ACUTE 2+V W 1V CHEST Result Date: 07/22/2024 CLINICAL DATA:  [id number] Dyspnea [id number] [phone] Encounter for imaging study to confirm nasogastric (NG) tube placement [id number] EXAM: DG ABDOMEN ACUTE WITH 1 VIEW CHEST COMPARISON:  Chest x-ray 07/19/2024 FINDINGS: Enteric tube courses below the diaphragm with tip and side port overlying the expected region of the gastric lumen. The heart and mediastinal contours are within normal limits. No focal consolidation. Chronic coarsened interstitial markings with no overt pulmonary edema. Bilateral trace pleural effusion. No pneumothorax. Right upper quadrant surgical clips. There is no evidence of dilated bowel loops or free intraperitoneal air. No radiopaque calculi or other significant radiographic abnormality is seen. No acute osseous abnormality.  Intact sternotomy wires. IMPRESSION: 1. Enteric tube in good position. 2. Bowel trace pleural effusions. 3. Nonobstructive bowel gas pattern. Electronically Signed   By: Morgane  Naveau M.D.   On: 07/22/2024 14:18   DG Chest Port 1 View Result Date: 07/19/2024 CLINICAL DATA:  Dyspnea EXAM: PORTABLE CHEST 1 VIEW COMPARISON:  07/18/2024 FINDINGS: Two frontal views of the chest demonstrate an enteric catheter passing below diaphragm, tip excluded by collimation. Left internal jugular catheter tip overlies superior vena cava unchanged. 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 The cardiac silhouette is stable. Mild central vascular  congestion, with a trace right pleural effusion unchanged since prior exam. The nodular density projecting over the right upper lateral chest on prior study is less well seen on this exam. This may be due to positioning. No pneumothorax. No acute bony abnormalities. IMPRESSION: 1. Pulmonary vascular congestion and small right pleural effusion, unchanged since prior study. 2. The nodular consolidation within the right upper lateral chest seen on prior x-ray and CT  is not well seen on this exam, likely due to patient positioning. Electronically Signed   By: Ozell Daring M.D.   On: 07/19/2024 17:34   DG Chest Port 1 View Result Date: 07/18/2024 EXAM: 1 VIEW XRAY OF THE CHEST 07/18/2024 04:59:06 AM COMPARISON: 07/16/2024 CLINICAL HISTORY: [id number] Acute respiratory failure with hypoxia (HCC) 427266. Acute respiratory failure with hypoxia FINDINGS: LUNGS AND PLEURA: Nodular opacity within the periphery of the right upper lobe appears similar to CT from 07/11/2024. Please refer to that report for follow-up recommendations. Mild asymmetric hazy opacification over the right mid and right lower lung may be related to positional artifact versus small posterior layering effusion. Mild subsegmental atelectasis in the left base. HEART AND MEDIASTINUM: Stable cardiomediastinal contours. Previous median sternotomy and CABG procedure. BONES AND SOFT TISSUES: Aortic atherosclerotic calcification. Left IJ catheter is identified with the tip in the projection of the SVC. There is an enteric tube which courses below the field of view. ET tube tip is stable above the carina. IMPRESSION: 1. Stable nodular opacity within the periphery of the right upper lobe, similar to CT from 07/11/2024. Please refer to that report for follow-up recommendations. 2. Mild asymmetric hazy opacification over the right mid and right lower lung, possibly related to rotational artifact versus small posterior layering effusion. 3. Mild subsegmental  atelectasis in the left base. Electronically signed by: Waddell Calk MD 07/18/2024 06:51 AM EDT RP Workstation: [id number]   DG Chest Port 1 View Result Date: 07/16/2024 CLINICAL DATA:  Endotracheal tube and central line placement. EXAM: PORTABLE CHEST 1 VIEW COMPARISON:  07/16/2024 at 8:58 a.m. FINDINGS: Patient is rotated to the right. Endotracheal tube has tip proximally 13.5 cm above the carina. This could be advanced approximately 6 cm. Left IJ central venous catheter has tip over the SVC. Enteric tube courses into the region of the stomach and off the image as tip is not visualized. Lungs are adequately inflated with possible mild hazy opacification over the right upper lung abutting the minor fissure which may be due to atelectasis or infection. Left lung is clear. Cardiomediastinal silhouette and remainder of the exam is unchanged. IMPRESSION: 1. Endotracheal tube has tip proximally 13.5 cm above the carina. This could be advanced approximately 6 cm. 2. Left IJ central venous catheter with tip over the SVC. 3. Possible mild hazy opacification over the right upper lung abutting the minor fissure which may be due to atelectasis or infection. Electronically Signed   By: Toribio Agreste M.D.   On: 07/16/2024 10:53   DG Chest Port 1 View Result Date: 07/16/2024 CLINICAL DATA:  [id number] Aspiration into airway [id number]. EXAM: PORTABLE CHEST 1 VIEW COMPARISON:  06/23/2024. FINDINGS: Bilateral lungs appear hyperlucent with coarse bronchovascular markings, in keeping with COPD. Bilateral lungs otherwise appear clear. No dense consolidation or lung collapse. Bilateral costophrenic angles are clear. Stable cardio-mediastinal silhouette. There are surgical staples along the heart border and sternotomy wires, status post CABG (coronary artery bypass graft). No acute osseous abnormalities. The soft tissues are within normal limits. Enteric tube is seen coursing below the left hemidiaphragm however, the tip is not  included on the film. IMPRESSION: No active disease. COPD. Electronically Signed   By: Ree Molt M.D.   On: 07/16/2024 09:09   DG Abd 1 View Result Date: 07/16/2024 CLINICAL DATA:  Vomiting.  NG tube placement. EXAM: ABDOMEN - 1 VIEW COMPARISON:  07/15/2024 FINDINGS: Nasogastric tube has tip and side-port over the stomach in the upper abdomen just left of midline.  Visualized lower thorax unchanged. Abdominopelvic images demonstrate a non obstructive bowel gas pattern. Mild fecal retention throughout the colon. Surgical clips over the right upper quadrant. There are degenerative changes of the spine and hips. Calcified plaque over the abdominal aorta and iliac arteries. IMPRESSION: 1. Nasogastric tube with tip and side-port over the stomach. 2. Nonobstructive bowel gas pattern. Mild fecal retention. 3. Aortic atherosclerosis. Electronically Signed   By: Toribio Agreste M.D.   On: 07/16/2024 07:51   DG Abd 1 View Result Date: 07/15/2024 CLINICAL DATA:  Orogastric tube placement. EXAM: ABDOMEN - 1 VIEW COMPARISON:  June 29, 2023. FINDINGS: Distal tip of nasogastric tube is seen in expected position of proximal stomach. IMPRESSION: Nasogastric tube tip seen in expected position of proximal stomach. Electronically Signed   By: Lynwood Landy Raddle M.D.   On: 07/15/2024 10:25   DG Chest Port 1 View Result Date: 07/13/2024 CLINICAL DATA:  Respiratory failure with hypoxia. EXAM: PORTABLE CHEST 1 VIEW COMPARISON:  Multiple previous chest x-rays and recent chest CT. FINDINGS: Stable surgical changes from bypass surgery. The cardiac silhouette, mediastinal and hilar contours are within normal limits and stable. Underlying emphysematous changes and pulmonary scarring. No pleural effusions or discrete pulmonary infiltrates. No pneumothorax. IMPRESSION: Emphysematous changes and pulmonary scarring but no acute overlying pulmonary process. Electronically Signed   By: MYRTIS Stammer M.D.   On: 07/13/2024 16:08   US  Abdomen  Limited RUQ (LIVER/GB) Result Date: 07/13/2024 CLINICAL DATA:  Portal hypertension. EXAM: ULTRASOUND ABDOMEN LIMITED RIGHT UPPER QUADRANT COMPARISON:  Abdominal ultrasound 10/19/2022 FINDINGS: Gallbladder: Surgically absent. Common bile duct: Diameter: 3.3 mm Liver: No focal lesion identified. Parenchymal echogenicity is increased and diffusely heterogeneous. There is lobular liver contour. Portal vein is patent on color Doppler imaging with normal direction of blood flow towards the liver. Other: None. IMPRESSION: Lobular liver contour with increased echogenicity and heterogeneous parenchyma consistent with cirrhosis. No focal liver lesion identified. Electronically Signed   By: Greig Pique M.D.   On: 07/13/2024 16:03   DG Chest Port 1 View Result Date: 07/12/2024 CLINICAL DATA:  Shortness of breath. EXAM: PORTABLE CHEST 1 VIEW COMPARISON:  07/11/2024 FINDINGS: The cardio pericardial silhouette is enlarged. There is pulmonary vascular congestion without overt pulmonary edema. Trace bilateral pleural effusions. Old posterior left sixth rib fracture. Telemetry leads overlie the chest. IMPRESSION: Enlargement of the cardiopericardial silhouette with new pulmonary vascular congestion and trace bilateral pleural effusions. Electronically Signed   By: Camellia Candle M.D.   On: 07/12/2024 12:11   CT Angio Chest PE W/Cm &/Or Wo Cm Result Date: 07/11/2024 CLINICAL DATA:  High probability for PE. Chest pain with shortness of breath EXAM: CT ANGIOGRAPHY CHEST WITH CONTRAST TECHNIQUE: Multidetector CT imaging of the chest was performed using the standard protocol during bolus administration of intravenous contrast. Multiplanar CT image reconstructions and MIPs were obtained to evaluate the vascular anatomy. RADIATION DOSE REDUCTION: This exam was performed according to the departmental dose-optimization program which includes automated exposure control, adjustment of the mA and/or kV according to patient size and/or  use of iterative reconstruction technique. CONTRAST:  75mL OMNIPAQUE  IOHEXOL  350 MG/ML SOLN COMPARISON:  CT angiogram chest 05/09/2024 FINDINGS: Cardiovascular: Satisfactory opacification of the pulmonary arteries to the segmental level. No evidence of pulmonary embolism. Patient is status post cardiac surgery. The heart is enlarged. There is no pericardial effusion. There are atherosclerotic calcifications of the aorta and coronary arteries. Aorta is normal in size. Mediastinum/Nodes: There are enlarged right hilar lymph nodes measuring up to  11 mm. Enlarged paratracheal lymph nodes measure up to 10 mm. Visualized thyroid gland and esophagus are within normal limits. Lungs/Pleura: Moderate emphysema again seen. There is stable scarring in the right lung apex. There is new patchy slightly nodular airspace disease in the right upper lobe measuring 2.0 by 1.1 cm. There is right-sided central peribronchial wall thickening. Previously identified airspace disease in the left lower lobe has resolved. There is no pleural effusion or pneumothorax. There is a stable left lower lobe pulmonary nodule image 6/57. Upper Abdomen: No acute abnormality. Musculoskeletal: No chest wall abnormality. No acute or significant osseous findings. Review of the MIP images confirms the above findings. IMPRESSION: 1. No evidence for pulmonary embolism. 2. New patchy slightly nodular airspace disease in the right upper lobe worrisome for pneumonia. Follow-up CT recommended in 4-6 weeks recommended to ensure resolution and to exclude underlying nodule. 3. Right-sided central peribronchial wall thickening worrisome for bronchitis. 4. Right hilar and mediastinal lymphadenopathy, likely reactive. 5. Stable left lower lobe pulmonary nodule. 6. Cardiomegaly. Aortic Atherosclerosis (ICD10-I70.0) and Emphysema (ICD10-J43.9). Electronically Signed   By: Greig Pique M.D.   On: 07/11/2024 18:54   DG Chest Portable 1 View Result Date:  07/11/2024 CLINICAL DATA:  SOB EXAM: PORTABLE CHEST - 1 VIEW COMPARISON:  June 24, 2024 FINDINGS: Biapical pleural thickening. Sternotomy wires and CABG markers. No focal airspace consolidation, pleural effusion, or pneumothorax. Mild cardiomegaly. Tortuous aorta with aortic atherosclerosis. No acute fracture or destructive lesions. Multilevel thoracic osteophytosis. IMPRESSION: No acute cardiopulmonary abnormality. Electronically Signed   By: Rogelia Myers M.D.   On: 07/11/2024 17:25   CARDIAC CATHETERIZATION Result Date: 07/02/2024   Prox LAD to Mid LAD lesion is 90% stenosed.   Ost LM to Mid LM lesion is 50% stenosed.   Ost Cx to Prox Cx lesion is 90% stenosed.   Ost RCA to Prox RCA lesion is 100% stenosed.   Mid Graft to Dist Graft lesion is 95% stenosed.   Recommend uninterrupted dual antiplatelet therapy with Aspirin  81mg  daily and Clopidogrel  75mg  daily for a minimum of 6 months (stable ischemic heart disease-Class I recommendation). 1.  Severe native three-vessel CAD 2.  LIMA to LAD and SVG to OM widely patent 3.  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  Successful direct stenting of the distal SVG to PDA with distal protection with a 4.0 x 15 mm drug-eluting stent with intravascular ultrasound guidance 5.  Aspirin  and clopidogrel  for at least 6 months followed by clopidogrel  indefinitely 6.  Continue workup for aortic valve replacement    ECHO 04/2024: 1. Left ventricular ejection fraction, by estimation, is 60 to 65%. The left ventricle has normal function. The left ventricle has no regional wall motion abnormalities. The left ventricular internal cavity size was mildly dilated. Left ventricular diastolic parameters are consistent with Grade I diastolic dysfunction (impaired relaxation).   2. Right ventricular systolic function is normal. The right ventricular  size is normal.   3. The mitral valve is normal in structure. No evidence of mitral valve  regurgitation.   4. The  aortic valve is normal in structure. Aortic valve regurgitation is  trivial. Severe aortic valve stenosis.   TELEMETRY reviewed by me 07/29/2024: sinus rhythm, rate 70s  EKG reviewed by me: sinus tachycardia ST depression, rate 129 bpm  Data reviewed by me 07/29/2024: last 24h vitals tele labs imaging I/O hospitalist progress note, PCCM notes  Principal Problem:   Acute respiratory distress Active Problems:  Hyperlipidemia   Cognitive impairment   Alcohol  abuse   Essential hypertension   CAD (coronary artery disease)   Stroke (HCC)   Iron  deficiency anemia   Acute on chronic diastolic CHF (congestive heart failure) (HCC)   COPD exacerbation (HCC)   Elevated lactic acid level   Lobar pneumonia (HCC)   Metabolic acidosis, increased anion gap   Myocardial injury    ASSESSMENT AND PLAN:  [name] is a 63 y.o. 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 Found to have pneumonia, overnight troponins uptrending. Cardiology was consulted for further evaluation.   # Ventricular Ectopy Per tele patient with bigeminy PVCs, started on IV amio on 07/30 for ventricular ectopy. PVC much improved s/p IV amio. Per tele this AM SR with rate 90s with no more ventricular ectopy -Continue to monitor telemetry closely.  -Monitor and replenish electrolytes for a goal K >4, Mag >2  -Continue Amio 400 mg BID for 10 days, then 200 mg daily.   # Acute hypoxic respiratory failure, mechanical ventilation # Aspiration pneumonia # Alcohol  withdrawal, improving # Acute on chronic HFpEF # Severe aortic stenosis Presented with SOB, cough, imaging concerning for HCAP/COPD  exacerbation. LA elevated on presentation at 3.6 > 5.9> 3.4. s/p extubation 08/01. Increased O2 need to 10L, concern for aspiration event on 08/05. NGT removed (08/07). BNP elevated 1500 > 3200 (08/07).  -Ordered albumin  25 g with IV lasix  40 mg BID to help augment diuresis and improve renal function. Closely monitor renal function, UOP. -Continue home dapagliflozin  10 mg daily.  -Will plan to optimize GDMT as BP and renal function allows. -Further management of respiratory failure/ aspiration PNA as per primary team.  -CIWA protocol given hx of alcohol  abuse. -Originally scheduled to see Dr. Katina 7/30 to discuss aortic valve repair/replacement options, this has been rescheduled to 09/03 at [facility].  # Demand ischemia # Coronary artery disease s/p CABG x3 (04/2012) s/p recent stent (07/02/24) # Hypertension # Hyperlipidemia With recent LHC, s/p DES to SVG to PDA 07/02/2024. Reported some CP when he first came to hospital which has now resolved, no recurrence. Troponin trended 44 > 144 > 205 > 980 > 1862 > 5766 > 5756. Patient weaned off levo on 07/31, BP improving and stabilizing. Reports chest tightness with deep breathing, MSK tenderness on palpation, trops repeated (08/07), minimally elevated and flat 72 > 75 is demand ischemia, not ACS is setting of stated above hospital concerns. Repeat EKG (08/07) is without acute ischemic changes.   -Continue home plavix  75 mg daily. Continue aspirin  81 mg daily. Continue DAPT due to recent stent placement on 07/16. -Continue Crestor  20 mg daily for high-intensity statin management.  -Continue home metoprolol  succinate 12.5 mg daily.  -Will plan to resume home losartan , Imdur  as BP allows.  -Recommend MAP > 65.   # Acute on chronic anemia # Hx AVM Patient received blood transfusion on 07/28, 07/31, 08/04. -Closely monitor H&H. -Continue PPIs. -Goal Hgb >8 due to cardiac history. Recommend transfusion when <8.  -Patient not on aspirin  historically due to  history of multiple small bowel AVMs that have been intermittently bleeding for years. Continue DAPT due to  recent stent placement.  -Management per primary team.    This patient's plan of care was discussed and created with Dr. Ammon and he is in agreement.  Signed: Dorene Comfort, PA-C  07/29/2024, 10:06 AM [facility]

## 2024-07-29 NOTE — Progress Notes (Signed)
 Speech Language Pathology Treatment: Dysphagia  Patient Details Name: [name] MRN: [id number] DOB: [birth year] Today's Date: 07/29/2024 Time: 9144-9086 SLP Time Calculation (min) (ACUTE ONLY): 18 min  Assessment / Plan / Recommendation Clinical Impression  Pt seen for ongoing diet toleration. Upon entering pt's room he sates that he attempted to order a pancake this morning but was not able to get pancakes on his current diet. (Dysphagia 2). Education provided on current diet with pt unable to demonstrate understanding. Pt was willing to consume more advanced PO textures with this Clinical research associate. Therefore skilled observation was provided of pt consuming graham crackers. Pt independently mentioned that he has difficulty chewing hard stuff d/t edentulous status. Pt reports that he has dentures but I quit wearing them 2 years ago. In addition, he voices several complaints regarding hospital food, the ice cream doesn't even taste like ice cream. He reports that he likes eggs if they are real, I like sausage but only if it's Curtis sausage. He further describes a baseline diet of mechanical soft food items as he reports getting meals from a local restaurant that included meatloaf, collards, green beans, really tender beef tips with gravy.  During this session, pt demonstrated lengthy oral phase c/b prolonged mastication (no evidence of rotary chew present) with graham crackers largely melting prior to swallow. Pt is more alert during today's session than time of diet initiation, therefore will upgrade pt to dysphagia 3 diet, continue thin liquids (via cup or straw), medicine whole with thin liquids (or puree). Pt is not appropriate for an upgrade to regular (given lack of dentition, prolonged oral phase and soft solid diet at baseline). Pt has met maximal benefit from skilled ST services and we will sign off.      HPI HPI: per MD Progress Note, [name] is a 63 y.o. male with medical history  significant of  GIB, alcohol  abuse, COPD on 2 L oxygen, hypertension, CAD with prior CABG and recent Stent placement,, severe aortic stenosis, HLD, stroke, dementia, chronic diastolic CHF, who presented to the hospital on 07/11/2024 with SOB, cough productive of yellowish sputum, sided chest pain.     Patient was recently hospitalized from 7/16 - 7/17 for elective LHC. Pt was s/p of successful direct stenting of distal SVG to PDA.     CTA negative for PE, showed patchy infiltration in the right upper lobe.  Patient is admitted to telemetry bed as inpatient.         He was admitted to the hospitalist service for COPD exacerbation and acute hypoxic respiratory failure.  He developed worsening respiratory distress shortly after admission.  He was placed on BiPAP and was transferred to the ICU for further management.  He has a became agitated and this was attributed to acute alcohol  withdrawal syndrome.  He was started on IV Precedex  infusion for this.  He developed acute toxic metabolic encephalopathy and worsening respiratory failure likely from aspiration pneumonia.  He required intubation and mechanical ventilation in the ICU.  He became hypotensive with induction meds despite IV fluids and IV Levophed  infusion.  There was brief loss of pulse but ROSC was achieved after 2 minutes of CPR and 1 dose of epinephrine . DG Chest 07/18/24: Stable nodular opacity within the periphery of the right upper lobe, similar  to CT from 07/11/2024. Please refer to that report for follow-up  recommendations.  2. Mild asymmetric hazy opacification over the right mid and right lower lung,  possibly related to rotational artifact versus small posterior  layering  effusion.  3. Mild subsegmental atelectasis in the left base.      SLP Plan  All goals met          Recommendations  Diet recommendations: Dysphagia 3 (mechanical soft);Thin liquid Liquids provided via: Straw Medication Administration: Whole meds with  puree Supervision: Patient able to self feed Compensations: Minimize environmental distractions;Slow rate;Small sips/bites Postural Changes and/or Swallow Maneuvers: Out of bed for meals;Seated upright 90 degrees;Upright 30-60 min after meal                  Oral care BID   Frequent or constant Supervision/Assistance Dysphagia, pharyngeal phase (R13.13)     All goals met    Lonia Roane B. Rubbie, M.S., CCC-SLP, Tree surgeon Certified Brain Injury Specialist [facility]  [facility] Rehabilitation Services Office [phone] Ascom [phone] Fax [phone]

## 2024-07-29 NOTE — Progress Notes (Signed)
 Occupational Therapy Treatment Patient Details Name: [name] MRN: [id number] DOB: [birth year] Today's Date: 07/29/2024   History of present illness 63 year old male patient admitted to the ICU with severe alcohol  withdrawal Dts requiring multiple doses of Phenobarb and eventually precedex  drip. Unfortunately course complicated by an aspriation event requiring intubation and mechanical ventilation with course c/b peri intubation brief cardiac arrest requiring one round of CPR.  Pt ultimately needing to be extubated ~24 hours.   OT comments  Pt seen for OT treatment on this date. Upon arrival to room pt seated in the recliner sitting on the EOB, agreeable to tx with minimal verbal encouragement. Pt STS from the recliner with verbal/tactile cues for technique, with poor carryover of technique. Pt amb ~80ft with RW and requested to return to sitting, stating he is too fatigued to continue. Pt tolerating standing pericare with good static standing with bil UEs on RW throughout. Lab tech in room at the end of session. Pt making good progress toward goals, will continue to follow POC. Discharge recommendation remains appropriate.        If plan is discharge home, recommend the following:  A lot of help with walking and/or transfers;A lot of help with bathing/dressing/bathroom;Assistance with cooking/housework;Direct supervision/assist for medications management;Assist for transportation   Equipment Recommendations  Other (comment)    Recommendations for Other Services      Precautions / Restrictions Precautions Precautions: Fall Recall of Precautions/Restrictions: Intact Restrictions Weight Bearing Restrictions Per Provider Order: No       Mobility Bed Mobility               General bed mobility comments: In recliner pre/post session    Transfers Overall transfer level: Needs assistance Equipment used: Rolling walker (2 wheels) Transfers: Sit to/from Stand Sit to Stand: Contact  guard assist           General transfer comment: CGA for x3 STS from the recliner with use of RW, verbal/tactile cues for technique     Balance Overall balance assessment: Needs assistance Sitting-balance support: Feet supported Sitting balance-Leahy Scale: Good     Standing balance support: During functional activity, Bilateral upper extremity supported Standing balance-Leahy Scale: Poor Standing balance comment: Improved static standing tolerance with RW support                           ADL either performed or assessed with clinical judgement   ADL Overall ADL's : Needs assistance/impaired Eating/Feeding: Set up;Sitting                       Toilet Transfer: Ambulation;BSC/3in1;Rolling walker (2 wheels) Toilet Transfer Details (indicate cue type and reason): Simulated toilet transfer Toileting- Clothing Manipulation and Hygiene: Maximal assistance;Sit to/from stand Toileting - Clothing Manipulation Details (indicate cue type and reason): MaxA for standing pericare from the recliner     Functional mobility during ADLs: Rolling walker (2 wheels);Contact guard assist General ADL Comments: CGA standing pericare with UE support of RW     Communication Communication Communication: Impaired Factors Affecting Communication: Hearing impaired   Cognition Arousal: Alert Behavior During Therapy: WFL for tasks assessed/performed Cognition: Cognition impaired   Orientation impairments: Situation Awareness: Intellectual awareness impaired, Online awareness impaired Memory impairment (select all impairments): Short-term memory Attention impairment (select first level of impairment): Sustained attention Executive functioning impairment (select all impairments): Reasoning, Problem solving, Sequencing OT - Cognition Comments: A/Ox3, Slow processing  Following commands: Intact Following commands impaired: Follows one step commands with  increased time      Cueing   Cueing Techniques: Verbal cues  Exercises Exercises: Other exercises Other Exercises Other Exercises: Edu pt/POA: Rehab expectations, benefits of continued participation           General Comments POA in room at the end of session, discussion rehab    Pertinent Vitals/ Pain       Pain Assessment Pain Assessment: No/denies pain                                                          Frequency  Min 2X/week        Progress Toward Goals  OT Goals(current goals can now be found in the care plan section)  Progress towards OT goals: Progressing toward goals  Acute Rehab OT Goals OT Goal Formulation: With patient Time For Goal Achievement: 08/02/24 Potential to Achieve Goals: Good ADL Goals Pt Will Perform Lower Body Bathing: with min assist Pt Will Perform Lower Body Dressing: with min assist Pt Will Transfer to Toilet: with min assist Additional ADL Goal #1: Pt will participate in functional cognitve assessment to assist with improved IADL mgmt at home   AM-PAC OT 6 Clicks Daily Activity     Outcome Measure   Help from another person eating meals?: None Help from another person taking care of personal grooming?: A Little Help from another person toileting, which includes using toliet, bedpan, or urinal?: A Lot Help from another person bathing (including washing, rinsing, drying)?: A Lot Help from another person to put on and taking off regular upper body clothing?: A Little Help from another person to put on and taking off regular lower body clothing?: A Little 6 Click Score: 17    End of Session Equipment Utilized During Treatment: Gait belt;Rolling walker (2 wheels);Oxygen  OT Visit Diagnosis: Muscle weakness (generalized) (M62.81);History of falling (Z91.81)   Activity Tolerance Patient tolerated treatment well   Patient Left in chair;with call bell/phone within reach;with chair alarm set;with  family/visitor present   Nurse Communication Mobility status        Time: 8586-8571 OT Time Calculation (min): 15 min  Charges: OT General Charges $OT Visit: 1 Visit OT Treatments $Therapeutic Activity: 8-22 mins  Larraine Colas M.S. OTR/L  07/29/24, 3:00 PM

## 2024-07-29 NOTE — Progress Notes (Signed)
   07/29/24 1445  Spiritual Encounters  Type of Visit Initial  Care provided to: Patient  Conversation partners present during encounter Nurse  Referral source Nurse (RN/NT/LPN)  Reason for visit Advance directives  OnCall Visit Yes  Spiritual Care Plan  Spiritual Care Issues Still Outstanding Referring to oncoming chaplain for further support   Advance directives are filled out and waiting for a notary and witnesses, which this chaplain was unable to acquire late in the afternoon.

## 2024-07-29 NOTE — Plan of Care (Signed)
  Problem: Education: Goal: Knowledge of General Education information will improve Description: Including pain rating scale, medication(s)/side effects and non-pharmacologic comfort measures Outcome: Progressing   Problem: Health Behavior/Discharge Planning: Goal: Ability to manage health-related needs will improve Outcome: Progressing   Problem: Clinical Measurements: Goal: Ability to maintain clinical measurements within normal limits will improve Outcome: Progressing Goal: Will remain free from infection Outcome: Progressing Goal: Diagnostic test results will improve Outcome: Progressing Goal: Respiratory complications will improve Outcome: Progressing Goal: Cardiovascular complication will be avoided Outcome: Progressing   Problem: Activity: Goal: Risk for activity intolerance will decrease Outcome: Progressing   Problem: Nutrition: Goal: Adequate nutrition will be maintained Outcome: Progressing   Problem: Coping: Goal: Level of anxiety will decrease Outcome: Progressing   Problem: Elimination: Goal: Will not experience complications related to bowel motility Outcome: Progressing Goal: Will not experience complications related to urinary retention Outcome: Progressing   Problem: Pain Managment: Goal: General experience of comfort will improve and/or be controlled Outcome: Progressing   Problem: Safety: Goal: Ability to remain free from injury will improve Outcome: Progressing   Problem: Skin Integrity: Goal: Risk for impaired skin integrity will decrease Outcome: Progressing   Problem: Education: Goal: Knowledge of disease or condition will improve Outcome: Progressing Goal: Knowledge of the prescribed therapeutic regimen will improve Outcome: Progressing Goal: Individualized Educational Video(s) Outcome: Progressing   Problem: Activity: Goal: Ability to tolerate increased activity will improve Outcome: Progressing Goal: Will verbalize the  importance of balancing activity with adequate rest periods Outcome: Progressing   Problem: Respiratory: Goal: Ability to maintain a clear airway will improve Outcome: Progressing Goal: Levels of oxygenation will improve Outcome: Progressing Goal: Ability to maintain adequate ventilation will improve Outcome: Progressing   Problem: Activity: Goal: Ability to tolerate increased activity will improve Outcome: Progressing   Problem: Clinical Measurements: Goal: Ability to maintain a body temperature in the normal range will improve Outcome: Progressing   Problem: Respiratory: Goal: Ability to maintain adequate ventilation will improve Outcome: Progressing Goal: Ability to maintain a clear airway will improve Outcome: Progressing   Problem: Activity: Goal: Ability to tolerate increased activity will improve Outcome: Progressing   Problem: Respiratory: Goal: Ability to maintain a clear airway and adequate ventilation will improve Outcome: Progressing   Problem: Role Relationship: Goal: Method of communication will improve Outcome: Progressing

## 2024-07-29 NOTE — Progress Notes (Signed)
 Physical Therapy Treatment Patient Details Name: [name] MRN: [id number] DOB: Feb 07, 1961 Today's Date: 07/29/2024   History of Present Illness 63 year old male patient admitted to the ICU with severe alcohol  withdrawal Dts requiring multiple doses of Phenobarb and eventually precedex  drip. Unfortunately course complicated by an aspriation event requiring intubation and mechanical ventilation with course c/b peri intubation brief cardiac arrest requiring one round of CPR.  Pt ultimately needing to be extubated ~24 hours.    PT Comments  Patient is agreeable to PT session. He was seated in the chair. Patient reports feeling generalized weakness. He was able to stand with emphasis on increasing standing tolerance (around 2 minutes). Patient is mildly tremulous with standing activity. Sp02 99% on 2 L02. He declined walking attempts due to fatigue. Encouraged LE exercises for strengthening. Recommend to continue PT to maximize independence. Rehabilitation < 3 hours/day recommended after this hospital stay.    If plan is discharge home, recommend the following: A little help with walking and/or transfers;A lot of help with bathing/dressing/bathroom;Assistance with cooking/housework;Assist for transportation;Help with stairs or ramp for entrance   Can travel by private vehicle     No  Equipment Recommendations  Rolling walker (2 wheels)    Recommendations for Other Services       Precautions / Restrictions Precautions Precautions: Fall Recall of Precautions/Restrictions: Intact Restrictions Weight Bearing Restrictions Per Provider Order: No     Mobility  Bed Mobility               General bed mobility comments: not assessed as patient sitting up on arrival and post session    Transfers Overall transfer level: Needs assistance Equipment used: Rolling walker (2 wheels) Transfers: Sit to/from Stand Sit to Stand: Contact guard assist           General transfer comment:  cues for initiating anterior weight shifting.    Ambulation/Gait             Pre-gait activities: emphasis on increasing standing tolerance. mild shakiness with standing. patient feels fatigued with activity with mild dyspnea. Sp02 99% on 2 L02 and heart rate 83bpm General Gait Details: the patient declined to walk at this time   Optometrist     Tilt Bed    Modified Rankin (Stroke Patients Only)       Balance Overall balance assessment: Needs assistance Sitting-balance support: Feet supported Sitting balance-Leahy Scale: Good     Standing balance support: During functional activity, Bilateral upper extremity supported Standing balance-Leahy Scale: Poor Standing balance comment: heavy reliance on rolling walker for support in standing                            Communication Communication Communication: Impaired Factors Affecting Communication: Hearing impaired  Cognition Arousal: Alert Behavior During Therapy: WFL for tasks assessed/performed   PT - Cognitive impairments: No family/caregiver present to determine baseline, Problem solving                       PT - Cognition Comments: delayed response at times but overall WFL Following commands: Intact Following commands impaired: Follows one step commands with increased time    Cueing Cueing Techniques: Verbal cues  Exercises      General Comments General comments (skin integrity, edema, etc.): encouraged AROM of LE for strengthening with ankle pumps, LAQ, SLR as able  Pertinent Vitals/Pain Pain Assessment Pain Assessment: Faces Faces Pain Scale: Hurts a little bit Pain Location: lower chest area from CPR Pain Descriptors / Indicators: Discomfort Pain Intervention(s): Limited activity within patient's tolerance, Monitored during session, Repositioned    Home Living                          Prior Function            PT Goals  (current goals can now be found in the care plan section) Acute Rehab PT Goals Patient Stated Goal: to get stronger PT Goal Formulation: With patient Time For Goal Achievement: 08/01/24 Potential to Achieve Goals: Fair Progress towards PT goals: Progressing toward goals    Frequency    Min 2X/week      PT Plan      Co-evaluation              AM-PAC PT 6 Clicks Mobility   Outcome Measure  Help needed turning from your back to your side while in a flat bed without using bedrails?: A Little Help needed moving from lying on your back to sitting on the side of a flat bed without using bedrails?: A Little Help needed moving to and from a bed to a chair (including a wheelchair)?: A Little Help needed standing up from a chair using your arms (e.g., wheelchair or bedside chair)?: A Lot Help needed to walk in hospital room?: A Lot Help needed climbing 3-5 steps with a railing? : A Lot 6 Click Score: 15    End of Session Equipment Utilized During Treatment: Oxygen Activity Tolerance: Patient tolerated treatment well Patient left: in chair;with call bell/phone within reach;with chair alarm set (SLP present) Nurse Communication: Mobility status PT Visit Diagnosis: Muscle weakness (generalized) (M62.81);Difficulty in walking, not elsewhere classified (R26.2);Unsteadiness on feet (R26.81)     Time: 9158-9144 PT Time Calculation (min) (ACUTE ONLY): 14 min  Charges:    $Therapeutic Activity: 8-22 mins PT General Charges $$ ACUTE PT VISIT: 1 Visit                     [name], PT, MPT    [name] 07/29/2024, 9:30 AM

## 2024-07-29 NOTE — Progress Notes (Signed)
 Progress Note    Devarious Pavek  FMW:[id number] DOB: [birth year]  DOA: 07/11/2024 PCP: Osa Geralds, NP      Brief Narrative:    Medical records reviewed and are as summarized below:  [name] is a 63 y.o. male with medical history significant of  GIB, alcohol  abuse, COPD on 2 L oxygen, hypertension, CAD with prior CABG and recent Stent placement,, severe aortic stenosis, HLD, stroke, dementia, chronic diastolic CHF, who presented to the hospital on 07/11/2024 with SOB, cough productive of yellowish sputum, sided chest pain.   Patient was recently hospitalized from 7/16 - 7/17 for elective LHC. Pt was s/p of successful direct stenting of distal SVG to PDA.  CTA negative for PE, showed patchy infiltration in the right upper lobe.  Patient is admitted to telemetry bed as inpatient.    He was admitted to the hospitalist service for COPD exacerbation and acute hypoxic respiratory failure.  He developed worsening respiratory distress shortly after admission.  He was placed on BiPAP and was transferred to the ICU for further management.  He has a became agitated and this was attributed to acute alcohol  withdrawal syndrome.  He was started on IV Precedex  infusion for this. He developed acute toxic metabolic encephalopathy and worsening respiratory failure likely from aspiration pneumonia.  He required intubation and mechanical ventilation in the ICU.  He became hypotensive with induction meds despite IV fluids and IV Levophed  infusion.  There was brief loss of pulse but ROSC was achieved after 2 minutes of CPR and 1 dose of epinephrine .    Significant Hospital Events: Including procedures, antibiotic start and stop dates in addition to other pertinent events   7/25: admit with respiratory failure 7/26: clinically decompensated, worsening respiratory status initiated on BiPAP 7/27: on nasal cannula, reports anxiety and tremors consistent with EtOH withdrawal.  Received a total of 15  mg/kg of phenobarbital , however he remained agitated requiring low dose precedex  gtt.  7/28: Pt resting in bed no agitation on precedex  gtt @0 .5 mcg/min.  Orders placed to transfuse 1 unit of pRBC's hgb 7.2 goal hgb 8.0.  No signs of active bleeding  7/29: Pt remains encephalopathic with severe DT's requiring precedex  gtt.  NGT placed to initiate tube feeds and administer medications  7/30: Earlier this morning with vomiting, concern for aspiration event as now with increased WOB and increasing FiO2 requirements (up to 4L). Remains encephalopathic on Precedex .  Respiratory status continued to decline, progressed to agonal respirations, Required INTUBATION for airway protection.  Hypotensive with induction meds despite IV fluids and Levophed  resulting in brief loss of pulse (ROSC obtained after 2 minutes of CPR and 1 epi).  Central line placed due to vasopressors. 7/31: Overnight with episode on non-sustained SVT which converted spontaneously, remains on Amiodarone  gtt per Cardiology.  Nursing reported black tarry stools overnight, Hgb remains stable.   Levophed  weaned off, on minimal vent settings, will perform WUA and SBT as mental status allows.  With fever of 101 F overnight and WBC nearly doubled, tracheal aspirate results pending, will check blood cultures and UA, broaden to empiric Vancomycin  and Zosyn  pending culture results.   08/01:  No significant events reported overnight. Received 1 unit pRBC's overnight for Hgb 7.5 with appropriate response to 8.8 and has since remained stable with no reports of bleeding from nursing.  Fevers resolved, WBC improved with broadening to Zosyn  yesterday, cultures currently with no growth.  Weaned off levophed .  On minimal vent support, passed WUA and SBT,  EXTUBATED TO HHFNC.  Diuresed with 20 mg IV Lasix  x1 dose with extubation, needs aggressive pulmonary toilet. 8/6: Patient with worsening respiratory status and oxygen requirement this morning, appears more  congested.  Apparently concern of aspiration event yesterday evening when his NG tube was dislodged.  Ordered repeat chest x-ray and sputum culture, worsening leukocytosis and procalcitonin at 0.26.  Starting on cefepime , Mucomyst  and giving 1 dose of IV Lasix .  Currently on 10 L of oxygen 8/7: Worsening respiratory status and increased work of breathing overnight, patient was transferred back to stepdown and started on heated high flow at 70% FiO2. Tube feed was held.  Chest x-ray with unchanged interstitial opacities bilaterally, possibly persistent interstitial edema and underlying emphysematous changes.  Also shows similar appearing lateral right upper lobe zone nodular opacity.  Patient was started on IV diuresis along with extensive pulmonary toileting.  BNP increased to 3252, troponin at 72 with flat curve, MRSA PCR negative PCCM was reconsulted as patient is high risk for reintubation. Palliative care was also consulted. 8/8: Clinically improving as of improving oxygen requirement, sputum cultures with normal respiratory flora. Saw alert team started on dysphagia 2 diet with thin liquid.  Hemoglobin decreased to 7.7 so ordered another unit of PRBC as goal hemoglobin is above 8 due to recent NSTEMI.  Continuing IV diuresis 8/9: Slowly improving hypoxia, now on 4 L of oxygen, clinically improved volume status with slight increase in creatinine today, cardiology is planning to switch to p.o. Diamox  from tomorrow.  Worsening leukocytosis today and hemoglobin improved to 8.7 after getting 1 more unit of PRBC yesterday. 8/10: Hemodynamically stable, now weaned back to 2 L of oxygen, IV Lasix  has been switched with p.o.  Creatinine 1.39 today.  TOC to look for SNF 8/11: Remained stable on 2 L of oxygen and continue to slowly improve, acetazolamide  was discontinued and he will continue on p.o. Lasix . 8/12: Hemodynamically stable, slight increase in creatinine, cardiology ordered albumin  with another dose  of IV Lasix  as repeat chest x-ray with slight worsening of bilateral pleural effusion.  TOC is working on placement  Assessment/Plan:   Principal Problem:   Acute respiratory distress Active Problems:   COPD exacerbation (HCC)   Lobar pneumonia (HCC)   Elevated lactic acid level   Metabolic acidosis, increased anion gap   Essential hypertension   Acute on chronic diastolic CHF (congestive heart failure) (HCC)   Hyperlipidemia   Stroke (HCC)   CAD (coronary artery disease)   Iron  deficiency anemia   Alcohol  abuse   Cognitive impairment   Myocardial injury   Nutrition Problem: Inadequate oral intake Etiology: lethargy/confusion  Signs/Symptoms: NPO status   Body mass index is 29.03 kg/m.  (Obesity)   Acute on chronic hypoxic respiratory failure: Improving Patient was able to wean to 2 L of oxygen from heated high flow. -Continuing with IV Lasix  - Continue supplemental oxygen-wean as tolerated S/p extubation on 07/18/2024.  Was intubated on 07/16/2024.  Aspiration pneumonia: Another concern of aspiration yesterday evening with resultant worsening respiratory status. Worsening leukocytosis again.  PCT at 0.26  Completed IV Zosyn  on 07/21/2024. Suspected septic shock: Resolved.  He is off of IV Levophed  infusion.  Repeat chest x-ray with persistent opacities And another slight worsening of leukocytosis today-can be due to steroid. -Cefepime  was discontinued after negative respiratory flora.  Procalcitonin now negative. -Sputum culture-normal respiratory flora-MRSA PCR was negative.  Improving leukocytosis - Aggressive pulmonary toilet - Continue with supportive care  COPD exacerbation: Continue bronchodilators.  Received 2 courses of steroid  Acute toxic metabolic encephalopathy: Mental status is improving.  Continue supportive care Alcohol  use disorder with alcohol  withdrawal syndrome: He is off of IV Precedex  infusion.  Continue with thiamine .  Brief cardiac arrest due  to severe hypotension from induction meds on 07/16/2024 (ROSC achieved after 2 minutes of CPR and 1 dose of epinephrine ).  Acute NSTEMI, elevated troponins (peaked at 5,766), CAD, recent elective left heart cath s/p DES to SVG to PDA 07/02/2024,  history of CABG x 3 (May 2013): Continue aspirin , Plavix  and rosuvastatin .  Acute on chronic diastolic CHF, severe aortic stenosis:  2D echo on 05/10/2024 showed EF estimated at 60 to 65%, grade 1 diastolic dysfunction, severe aortic valve stenosis. Repeat BNP shows worsening and now at 3252 - Started on twice daily IV diuresis- - Daily weight and BNP - Strict intake and output  Chronic anemia, history of GI bleed: S/p transfusion with 4 unit of PRBCs on 07/25/2024, 07/21/2024, 7/28 , 07/17/2024 Hemoglobin stable at 8.7 today- No evidence of overt GI bleeding thus far.   Continue IV Protonix .  He was evaluated by gastroenterologist on 07/13/2024.  GI signed off. 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  Continue enteral nutrition.  Monitor BMP.  AKI: Resolved  Hyperglycemia: Glucose levels are stable.  Hemoglobin A1c was 5.3 on 10/28/2022   Comorbidities include hypertension, hyperlipidemia, history of stroke   Diet Order             DIET DYS 3 Room service appropriate? Yes; Fluid consistency: Thin  Diet effective now                  Consultants: PCCM Gastroenterologist Cardiologist  Procedures: Intubated on 07/16/2024 and extubated on 07/18/2024 Left IJ central line placed on 07/16/2024  Medications:    amiodarone   200 mg Oral Daily   ascorbic acid   500 mg Oral Daily   aspirin   81 mg Oral Daily   budesonide  (PULMICORT ) nebulizer solution  0.5 mg Nebulization BID   clopidogrel   75 mg Oral Daily    dapagliflozin  propanediol  10 mg Oral Daily   enoxaparin  (LOVENOX ) injection  40 mg Subcutaneous QHS   feeding supplement  237 mL Oral TID BM   folic acid   1 mg Oral Daily   furosemide   40 mg Intravenous BID   ipratropium-albuterol   3 mL Nebulization TID   iron  polysaccharides  150 mg Oral Daily   metoprolol  succinate  12.5 mg Oral Daily   multivitamin with minerals  1 tablet Oral Daily   nicotine   7 mg Transdermal Daily   mouth rinse  15 mL Mouth Rinse 4 times per day   pantoprazole   40 mg Oral BID AC   polyethylene glycol  17 g Oral Daily   predniSONE   20 mg Oral Q breakfast   rosuvastatin   20 mg Oral Daily   senna-docusate  2 tablet Oral QHS   thiamine   100 mg Oral Daily   Vitamin D  (Ergocalciferol )  50,000 Units Oral Q7 days   Continuous Infusions:  Anti-infectives (From admission, onward)    Start     Dose/Rate Route Frequency Ordered Stop   07/23/24 1400  ceFEPIme  (MAXIPIME ) 2 g in sodium chloride  0.9 % 100 mL IVPB  Status:  Discontinued  2 g 200 mL/hr over 30 Minutes Intravenous Every 8 hours 07/23/24 1203 07/24/24 1028   07/17/24 1000  piperacillin -tazobactam (ZOSYN ) IVPB 3.375 g  Status:  Discontinued        3.375 g 12.5 mL/hr over 240 Minutes Intravenous Every 8 hours 07/17/24 0811 07/21/24 1100   07/17/24 1000  vancomycin  (VANCOREADY) IVPB 2000 mg/400 mL  Status:  Discontinued        2,000 mg 200 mL/hr over 120 Minutes Intravenous  Once 07/17/24 0811 07/17/24 1026   07/14/24 1200  cefTRIAXone  (ROCEPHIN ) 2 g in sodium chloride  0.9 % 100 mL IVPB        2 g 200 mL/hr over 30 Minutes Intravenous Every 24 hours 07/14/24 1030 07/16/24 1204   07/12/24 1600  piperacillin -tazobactam (ZOSYN ) IVPB 3.375 g  Status:  Discontinued        3.375 g 12.5 mL/hr over 240 Minutes Intravenous Every 8 hours 07/12/24 1503 07/14/24 1030   07/12/24 0900  vancomycin  (VANCOREADY) IVPB 1250 mg/250 mL  Status:  Discontinued        1,250 mg 166.7 mL/hr over 90 Minutes Intravenous Every 12  hours 07/11/24 2012 07/12/24 1458   07/11/24 2100  ceFEPIme  (MAXIPIME ) 2 g in sodium chloride  0.9 % 100 mL IVPB  Status:  Discontinued        2 g 200 mL/hr over 30 Minutes Intravenous Every 8 hours 07/11/24 1959 07/12/24 1458   07/11/24 2100  vancomycin  (VANCOREADY) IVPB 2000 mg/400 mL        2,000 mg 200 mL/hr over 120 Minutes Intravenous  Once 07/11/24 2004 07/11/24 2255   07/11/24 1945  cefTRIAXone  (ROCEPHIN ) 1 g in sodium chloride  0.9 % 100 mL IVPB  Status:  Discontinued        1 g 200 mL/hr over 30 Minutes Intravenous  Once 07/11/24 1930 07/11/24 1933   07/11/24 1945  azithromycin  (ZITHROMAX ) 500 mg in sodium chloride  0.9 % 250 mL IVPB  Status:  Discontinued        500 mg 250 mL/hr over 60 Minutes Intravenous  Once 07/11/24 1930 07/11/24 1955   07/11/24 1945  cefTRIAXone  (ROCEPHIN ) 2 g in sodium chloride  0.9 % 100 mL IVPB  Status:  Discontinued        2 g 200 mL/hr over 30 Minutes Intravenous  Once 07/11/24 1933 07/11/24 1955       Family Communication/Anticipated D/C date and plan/Code Status    DVT prophylaxis: Lovenox      Code Status: Full Code  Family Communication: Discussed with patient  Disposition Plan: Plan to discharge to SNF   Status is: Inpatient Remains inpatient appropriate because: Acute respiratory failure, aspiration pneumonia   Subjective:  Patient was sitting in chair when seen today.  He was complaining of some nasal dryness and irritation.  Shortness of breath improving.  No chest pain  Objective:    Vitals:   07/29/24 0500 07/29/24 0717 07/29/24 0808 07/29/24 1145  BP:   105/60 (!) 98/52  Pulse:   69   Resp:      Temp:   97.9 F (36.6 C) 97.7 F (36.5 C)  TempSrc:   Oral Oral  SpO2:  99% 100% 100%  Weight: 97.1 kg     Height:       No data found.   Intake/Output Summary (Last 24 hours) at 07/29/2024 1336 Last data filed at 07/29/2024 1252 Gross per 24 hour  Intake 1440 ml  Output 890 ml  Net 550 ml   [employer]  07/27/24  0500 07/28/24 0500 07/29/24 0500  Weight: 97.1 kg 97.6 kg 97.1 kg    Exam: General.  Frail gentleman, in no acute distress. Pulmonary.  Few basal crackles bilaterally, normal respiratory effort. CV.  Regular rate and rhythm, no JVD, rub or murmur. Abdomen.  Soft, nontender, nondistended, BS positive. CNS.  Alert and oriented .  No focal neurologic deficit. Extremities.  No edema, no cyanosis, pulses intact and symmetrical. Psychiatry.  Judgment and insight appears normal.   Data Reviewed:   I have personally reviewed following labs and imaging studies:  Labs: Labs show the following:   Basic Metabolic Panel: Recent Labs  Lab 07/23/24 0457 07/24/24 0406 07/25/24 0439 07/25/24 1334 07/26/24 0426 07/26/24 1353 07/27/24 0536 07/28/24 0615 07/29/24 0324  NA 142 141 144   < > 137 135 138 138 136  K 4.1 4.3 4.8   < > 3.7 3.6 3.2* 3.6 3.6  CL 101 99 99   < > 94* 95* 97* 99 96*  CO2 32 33* 34*   < > 32 30 27 29 27   GLUCOSE 138* 160* 125*   < > 170* 166* 115* 118* 102*  BUN 37* 48* 54*   < > 53* 49* 41* 35* 34*  CREATININE 1.12 1.05 1.15   < > 1.38* 1.27* 1.39* 1.32* 1.46*  CALCIUM  8.6* 8.7* 8.5*   < > 8.6* 8.5* 8.8* 9.5 9.0  MG 2.7*  --  2.9*  --  2.4  --  2.0 1.9  --   PHOS 4.6 4.9* 5.0*  --  4.0  --   --   --   --    < > = values in this interval not displayed.   GFR Estimated Creatinine Clearance: 60.9 mL/min (A) (by C-G formula based on SCr of 1.46 mg/dL (H)). Liver Function Tests: Recent Labs  Lab 07/24/24 0406  ALBUMIN  2.9*   No results for input(s): LIPASE, AMYLASE in the last 168 hours. No results for input(s): AMMONIA in the last 168 hours.  Coagulation profile No results for input(s): INR, PROTIME in the last 168 hours.  CBC: Recent Labs  Lab 07/23/24 0457 07/24/24 0406 07/25/24 0439 07/26/24 0426 07/27/24 0535  WBC 18.8* 15.7* 10.5 16.1* 13.4*  HGB 9.3* 8.1* 7.7* 8.7* 8.7*  HCT 31.0* 27.5* 26.2* 28.3* 28.4*  MCV 92.5 93.9 95.6 93.4 93.7   PLT 247 246 231 274 251   Cardiac Enzymes: No results for input(s): CKTOTAL, CKMB, CKMBINDEX, TROPONINI in the last 168 hours. BNP (last 3 results) No results for input(s): PROBNP in the last 8760 hours. CBG: Recent Labs  Lab 07/25/24 0813 07/25/24 1149 07/25/24 1615 07/26/24 0738 07/26/24 1254  GLUCAP 102* 127* 148* 128* 158*   D-Dimer: No results for input(s): DDIMER in the last 72 hours. Hgb A1c: No results for input(s): HGBA1C in the last 72 hours. Lipid Profile: No results for input(s): CHOL, HDL, LDLCALC, TRIG, CHOLHDL, LDLDIRECT in the last 72 hours.  Thyroid function studies: No results for input(s): TSH, T4TOTAL, T3FREE, THYROIDAB in the last 72 hours.  Invalid input(s): FREET3 Anemia work up: No results for input(s): VITAMINB12, FOLATE, FERRITIN, TIBC, IRON , RETICCTPCT in the last 72 hours. Sepsis Labs: Recent Labs  Lab 07/23/24 0457 07/24/24 0406 07/25/24 0439 07/26/24 0426 07/27/24 0535  PROCALCITON 0.26  --   --   --  <0.10  WBC 18.8* 15.7* 10.5 16.1* 13.4*    Microbiology Recent Results (from the past 240 hours)  Culture, Respiratory w Gram Stain  Status: None   Collection Time: 07/23/24 11:32 AM   Specimen: Sputum  Result Value Ref Range Status   Specimen Description   Final    SPU Performed at [facility], [address] [place], [place] [address]    Special Requests   Final    NONE Performed at [facility], [address] [place], [place] [address]    Gram Stain   Final    RARE WBC PRESENT, PREDOMINANTLY PMN RARE GRAM POSITIVE COCCI    Culture   Final    Normal respiratory flora-no Staph aureus or Pseudomonas seen Performed at [facility], [address] [place], [place] [address]    Report Status 07/25/2024 FINAL  Final  MRSA Next Gen by PCR, Nasal     Status: None   Collection Time: 07/24/24 11:08 AM   Specimen: Nasal Mucosa; Nasal Swab  Result  Value Ref Range Status   MRSA by PCR Next Gen NOT DETECTED NOT DETECTED Final    Comment: (NOTE) The GeneXpert MRSA Assay (FDA approved for NASAL specimens only), is one component of a comprehensive MRSA colonization surveillance program. It is not intended to diagnose MRSA infection nor to guide or monitor treatment for MRSA infections. Test performance is not FDA approved in patients less than 76 years old. Performed at [facility], [address], [place], [place] [address]     Procedures and diagnostic studies:  DG Chest [facility] 1 View Result Date: 07/29/2024 CLINICAL DATA:  Congestive heart failure. EXAM: PORTABLE CHEST 1 VIEW COMPARISON:  07/24/2024 FINDINGS: The enteric tube has been removed. Post median sternotomy with stable cardiomegaly. Bilateral pleural effusions and bibasilar opacities, slightly worsened in the interim. Improved interstitial opacities with mild residual. Underlying emphysematous change. No pneumothorax. IMPRESSION: 1. Bilateral pleural effusions and bibasilar opacities, slightly worsened in the interim. 2. Improved interstitial opacities with mild residual. Electronically Signed   By: Andrea Gasman M.D.   On: 07/29/2024 09:53      LOS: 18 days   Amaryllis Dare MD  [facility]   Pager on [email or web address]. If 7PM-7AM, please contact night-coverage at [email or web address]  07/29/2024, 1:36 PM

## 2024-07-29 NOTE — TOC Progression Note (Signed)
 Transition of Care [facility]) - Progression Note    Patient Details  Name: Yash Cacciola MRN: [id number] Date of Birth: [birth year]  Transition of Care [facility]) CM/SW Contact  [name], LCSW Phone Number: 07/29/2024, 12:35 PM  Clinical Narrative:   CSW reviewed bed offers with patient. He will consider.  Expected Discharge Plan and Services                                               Social Drivers of Health (SDOH) Interventions SDOH Screenings   Food Insecurity: No Food Insecurity (07/12/2024)  Housing: Low Risk  (07/12/2024)  Transportation Needs: No Transportation Needs (07/12/2024)  Utilities: Not At Risk (07/12/2024)  Social Connections: Socially Isolated (07/12/2024)  Tobacco Use: High Risk (07/11/2024)    Readmission Risk Interventions    05/14/2024    8:44 AM 10/27/2022   11:28 AM  Readmission Risk Prevention Plan  Transportation Screening  Complete  PCP or Specialist Appt within 5-7 Days  Complete  PCP or Specialist Appt within 3-5 Days Complete   Home Care Screening  Complete  Medication Review (RN CM)  Complete  HRI or Home Care Consult Complete   Social Work Consult for Recovery Care Planning/Counseling Complete   Palliative Care Screening Not Applicable   Medication Review [employer]) Complete

## 2024-07-30 DIAGNOSIS — R0603 Acute respiratory distress: Secondary | ICD-10-CM | POA: Diagnosis not present

## 2024-07-30 DIAGNOSIS — Z515 Encounter for palliative care: Secondary | ICD-10-CM | POA: Diagnosis not present

## 2024-07-30 DIAGNOSIS — J189 Pneumonia, unspecified organism: Secondary | ICD-10-CM | POA: Diagnosis not present

## 2024-07-30 DIAGNOSIS — J441 Chronic obstructive pulmonary disease with (acute) exacerbation: Secondary | ICD-10-CM | POA: Diagnosis not present

## 2024-07-30 LAB — BASIC METABOLIC PANEL WITH GFR
Anion gap: 11 (ref 5–15)
BUN: 41 mg/dL — ABNORMAL HIGH (ref 8–23)
CO2: 29 mmol/L (ref 22–32)
Calcium: 8.8 mg/dL — ABNORMAL LOW (ref 8.9–10.3)
Chloride: 97 mmol/L — ABNORMAL LOW (ref 98–111)
Creatinine, Ser: 1.36 mg/dL — ABNORMAL HIGH (ref 0.61–1.24)
GFR, Estimated: 58 mL/min — ABNORMAL LOW (ref >60)
Glucose, Bld: 138 mg/dL — ABNORMAL HIGH (ref 70–99)
Potassium: 3.6 mmol/L (ref 3.5–5.1)
Sodium: 137 mmol/L (ref 135–145)

## 2024-07-30 LAB — CBC
HCT: 27.2 % — ABNORMAL LOW (ref 39.0–52.0)
Hemoglobin: 8.5 g/dL — ABNORMAL LOW (ref 13.0–17.0)
MCH: 28.3 pg (ref 26.0–34.0)
MCHC: 31.3 g/dL (ref 30.0–36.0)
MCV: 90.7 fL (ref 80.0–100.0)
Platelets: 310 K/uL (ref 150–400)
RBC: 3 MIL/uL — ABNORMAL LOW (ref 4.22–5.81)
RDW: 19 % — ABNORMAL HIGH (ref 11.5–15.5)
WBC: 8.8 K/uL (ref 4.0–10.5)
nRBC: 0 % (ref 0.0–0.2)

## 2024-07-30 MED ORDER — METOPROLOL SUCCINATE ER 25 MG PO TB24
25.0000 mg | ORAL_TABLET | Freq: Every day | ORAL | Status: DC
  Administered 2024-07-30 – 2024-07-31 (×3): 25 mg via ORAL
  Filled 2024-07-30 (×2): qty 1

## 2024-07-30 MED ORDER — TORSEMIDE 20 MG PO TABS
10.0000 mg | ORAL_TABLET | Freq: Every day | ORAL | Status: DC
  Administered 2024-07-30 (×2): 10 mg via ORAL
  Filled 2024-07-30 (×2): qty 1

## 2024-07-30 MED ORDER — POTASSIUM CHLORIDE CRYS ER 20 MEQ PO TBCR
40.0000 meq | EXTENDED_RELEASE_TABLET | Freq: Once | ORAL | Status: AC
  Administered 2024-07-30 (×2): 40 meq via ORAL
  Filled 2024-07-30: qty 2

## 2024-07-30 NOTE — Progress Notes (Signed)
 Nutrition Follow-up  DOCUMENTATION CODES:   Not applicable  INTERVENTION:   -Continue Ensure Plus High Protein po TID, each supplement provides 350 kcal and 20 grams of protein -Continue Magic cup TID with meals, each supplement provides 290 kcal and 9 grams of protein -Continue MVI, thiamine  and folic acid  daily  -Continue Ergocalciferol  50,000 units po weekly x 6 weeks   NUTRITION DIAGNOSIS:   Inadequate oral intake related to lethargy/confusion as evidenced by NPO status.  Progressing; advanced to PO diet on 07/25/24  GOAL:   Patient will meet greater than or equal to 90% of their needs  Progressing   MONITOR:   PO intake, Supplement acceptance, Labs, Weight trends, Skin, I & O's  REASON FOR ASSESSMENT:   Consult Enteral/tube feeding initiation and management  ASSESSMENT:   63 y/o male with h/o HLD, etoh abuse, HTN, CAD s/p CABG x 3 (2013), severe aortic stenosis, CHF, CVA, IDA, COPD, MI, GERD, hiatal hernia, CKD III, intestional AVM, depression and recent admission for elective left heart catheterization s/p left heart cath and stent placement 7/16 and who is now admitted with NSTEMI, CHF/COPD exacerbation, suspected aspiration and etoh withdrawal.  8/8- NGT removed, s/p BSE- dysphagia 2 diet with thin liquids 8/11- s/p BSE- dysphagia 2 diet with thin liquids 8/12- s/p BSE- dysphagia 3 diet with thin liquids  Reviewed I/O's: -995 ml x 24 hours and +1.4 L since 07/16/24  UOP: 1.5 L x 24 hours  Spoke with pt at bedside, who was sitting in recliner chair at time of visit. Pt reports feeling fair, complaining of overall tightness and shortness of breath. Pt reports his appetite is fair; he consumed a few bites of eggs and pancakes this morning. He states the pancakes were too hard even though I added a lot of packs of syrup to them. Pt has been consuming [employer] and Ensure supplements. Pt likes these and RN at bedside reports he has been consuming.   Discussed  importance of good meal and supplement intake to promote healing. Pt amenable to continue supplements.   Pt discussed plans after leaving the hospital; he is looking forward to going to rehab and eventually home. Emotional support provided.   Medications reviewed and include vitamin C , lovenox , folic acid , miralax , senokot, thiamine , demadex , and vitamin D .   Per TOC notes, plan for d/c to SNF once medically stable [facility]).   Labs reviewed: CBGS: 158 (inpatient orders for glycemic control are none).    Diet Order:   Diet Order             DIET DYS 3 Room service appropriate? Yes; Fluid consistency: Thin  Diet effective now                   EDUCATION NEEDS:   No education needs have been identified at this time  Skin:  Skin Assessment: Reviewed RN Assessment (ecchymosis)  Last BM:  8/5- type 6  Height:   Ht Readings from Last 1 Encounters:  07/11/24 6' (1.829 m)    Weight:   Wt Readings from Last 1 Encounters:  07/30/24 97.4 kg    Ideal Body Weight:  80.9 kg  BMI:  Body mass index is 29.12 kg/m.  Estimated Nutritional Needs:   Kcal:  2300-2600kcal/day  Protein:  115-130g/day  Fluid:  2.0L/day    Margery ORN, RD, LDN, CDCES Registered Dietitian III Certified Diabetes Care and Education Specialist If unable to reach this RD, please use RD Inpatient group chat  on secure chat between hours of 8am-4 pm daily

## 2024-07-30 NOTE — Progress Notes (Signed)
 Progress Note    [name]  FMW:[id number] DOB: 1961/01/22  DOA: 07/11/2024 PCP: Osa Geralds, NP      Brief Narrative:    Medical records reviewed and are as summarized below:  [name] is a 63 y.o. male with medical history significant of  GIB, alcohol  abuse, COPD on 2 L oxygen, hypertension, CAD with prior CABG and recent Stent placement,, severe aortic stenosis, HLD, stroke, dementia, chronic diastolic CHF, who presented to the hospital on 07/11/2024 with SOB, cough productive of yellowish sputum, sided chest pain.   Patient was recently hospitalized from 7/16 - 7/17 for elective LHC. Pt was s/p of successful direct stenting of distal SVG to PDA.  CTA negative for PE, showed patchy infiltration in the right upper lobe.  Patient is admitted to telemetry bed as inpatient.    He was admitted to the hospitalist service for COPD exacerbation and acute hypoxic respiratory failure.  He developed worsening respiratory distress shortly after admission.  He was placed on BiPAP and was transferred to the ICU for further management.  He has a became agitated and this was attributed to acute alcohol  withdrawal syndrome.  He was started on IV Precedex  infusion for this. He developed acute toxic metabolic encephalopathy and worsening respiratory failure likely from aspiration pneumonia.  He required intubation and mechanical ventilation in the ICU.  He became hypotensive with induction meds despite IV fluids and IV Levophed  infusion.  There was brief loss of pulse but ROSC was achieved after 2 minutes of CPR and 1 dose of epinephrine .    Significant Hospital Events: Including procedures, antibiotic start and stop dates in addition to other pertinent events   7/25: admit with respiratory failure 7/26: clinically decompensated, worsening respiratory status initiated on BiPAP 7/27: on nasal cannula, reports anxiety and tremors consistent with EtOH withdrawal.  Received a total of 15  mg/kg of phenobarbital , however he remained agitated requiring low dose precedex  gtt.  7/28: Pt resting in bed no agitation on precedex  gtt @0 .5 mcg/min.  Orders placed to transfuse 1 unit of pRBC's hgb 7.2 goal hgb 8.0.  No signs of active bleeding  7/29: Pt remains encephalopathic with severe DT's requiring precedex  gtt.  NGT placed to initiate tube feeds and administer medications  7/30: Earlier this morning with vomiting, concern for aspiration event as now with increased WOB and increasing FiO2 requirements (up to 4L). Remains encephalopathic on Precedex .  Respiratory status continued to decline, progressed to agonal respirations, Required INTUBATION for airway protection.  Hypotensive with induction meds despite IV fluids and Levophed  resulting in brief loss of pulse (ROSC obtained after 2 minutes of CPR and 1 epi).  Central line placed due to vasopressors. 7/31: Overnight with episode on non-sustained SVT which converted spontaneously, remains on Amiodarone  gtt per Cardiology.  Nursing reported black tarry stools overnight, Hgb remains stable.   Levophed  weaned off, on minimal vent settings, will perform WUA and SBT as mental status allows.  With fever of 101 F overnight and WBC nearly doubled, tracheal aspirate results pending, will check blood cultures and UA, broaden to empiric Vancomycin  and Zosyn  pending culture results.   08/01:  No significant events reported overnight. Received 1 unit pRBC's overnight for Hgb 7.5 with appropriate response to 8.8 and has since remained stable with no reports of bleeding from nursing.  Fevers resolved, WBC improved with broadening to Zosyn  yesterday, cultures currently with no growth.  Weaned off levophed .  On minimal vent support, passed WUA and SBT,  EXTUBATED TO HHFNC.  Diuresed with 20 mg IV Lasix  x1 dose with extubation, needs aggressive pulmonary toilet. 8/6: Patient with worsening respiratory status and oxygen requirement this morning, appears more  congested.  Apparently concern of aspiration event yesterday evening when his NG tube was dislodged.  Ordered repeat chest x-ray and sputum culture, worsening leukocytosis and procalcitonin at 0.26.  Starting on cefepime , Mucomyst  and giving 1 dose of IV Lasix .  Currently on 10 L of oxygen 8/7: Worsening respiratory status and increased work of breathing overnight, patient was transferred back to stepdown and started on heated high flow at 70% FiO2. Tube feed was held.  Chest x-ray with unchanged interstitial opacities bilaterally, possibly persistent interstitial edema and underlying emphysematous changes.  Also shows similar appearing lateral right upper lobe zone nodular opacity.  Patient was started on IV diuresis along with extensive pulmonary toileting.  BNP increased to 3252, troponin at 72 with flat curve, MRSA PCR negative PCCM was reconsulted as patient is high risk for reintubation. Palliative care was also consulted. 8/8: Clinically improving as of improving oxygen requirement, sputum cultures with normal respiratory flora. Saw alert team started on dysphagia 2 diet with thin liquid.  Hemoglobin decreased to 7.7 so ordered another unit of PRBC as goal hemoglobin is above 8 due to recent NSTEMI.  Continuing IV diuresis 8/9: Slowly improving hypoxia, now on 4 L of oxygen, clinically improved volume status with slight increase in creatinine today, cardiology is planning to switch to p.o. Diamox  from tomorrow.  Worsening leukocytosis today and hemoglobin improved to 8.7 after getting 1 more unit of PRBC yesterday. 8/10: Hemodynamically stable, now weaned back to 2 L of oxygen, IV Lasix  has been switched with p.o.  Creatinine 1.39 today.  TOC to look for SNF 8/11: Remained stable on 2 L of oxygen and continue to slowly improve, acetazolamide  was discontinued and he will continue on p.o. Lasix . 8/12: Hemodynamically stable, slight increase in creatinine, cardiology ordered albumin  with another dose  of IV Lasix  as repeat chest x-ray with slight worsening of bilateral pleural effusion.  TOC is working on placement 8/13: Remained hemodynamically stable, leukocytosis resolved and creatinine with some improvement to 1.36, cardiology switched him to p.o. Lasix . Had a bed offer-pending insurance authorization.  Assessment/Plan:   Principal Problem:   Acute respiratory distress Active Problems:   COPD exacerbation (HCC)   Lobar pneumonia (HCC)   Elevated lactic acid level   Metabolic acidosis, increased anion gap   Essential hypertension   Acute on chronic diastolic CHF (congestive heart failure) (HCC)   Hyperlipidemia   Stroke (HCC)   CAD (coronary artery disease)   Iron  deficiency anemia   Alcohol  abuse   Cognitive impairment   Myocardial injury   Nutrition Problem: Inadequate oral intake Etiology: lethargy/confusion  Signs/Symptoms: NPO status   Body mass index is 29.12 kg/m.  (Obesity)   Acute on chronic hypoxic respiratory failure: Improving Patient was able to wean to 2 L of oxygen from heated high flow. -IV Lasix  has been switched with p.o. - Continue supplemental oxygen-wean as tolerated S/p extubation on 07/18/2024.  Was intubated on 07/16/2024.  Aspiration pneumonia: Another concern of aspiration yesterday evening with resultant worsening respiratory status. Worsening leukocytosis again.  PCT at 0.26  Completed IV Zosyn  on 07/21/2024. Suspected septic shock: Resolved.  He is off of IV Levophed  infusion.  Repeat chest x-ray with persistent opacities And another slight worsening of leukocytosis today-can be due to steroid. -Cefepime  was discontinued after negative respiratory flora.  Procalcitonin  now negative. -Sputum culture-normal respiratory flora-MRSA PCR was negative.  Leukocytosis resolved - Aggressive pulmonary toilet - Continue with supportive care  COPD exacerbation: Continue bronchodilators.   Received 2 courses of steroid  Acute toxic metabolic  encephalopathy: Mental status is improving.  Continue supportive care Alcohol  use disorder with alcohol  withdrawal syndrome: He is off of IV Precedex  infusion.  Continue with thiamine .  Brief cardiac arrest due to severe hypotension from induction meds on 07/16/2024 (ROSC achieved after 2 minutes of CPR and 1 dose of epinephrine ).  Acute NSTEMI, elevated troponins (peaked at 5,766), CAD, recent elective left heart cath s/p DES to SVG to PDA 07/02/2024,  history of CABG x 3 (May 2013): Continue aspirin , Plavix  and rosuvastatin .  Acute on chronic diastolic CHF, severe aortic stenosis:  2D echo on 05/10/2024 showed EF estimated at 60 to 65%, grade 1 diastolic dysfunction, severe aortic valve stenosis. Repeat BNP shows worsening  at 3252>>970 - IV Lasix  now switched with p.o. - Daily weight and BNP - Strict intake and output  Chronic anemia, history of GI bleed: S/p transfusion with 4 unit of PRBCs on 07/25/2024, 07/21/2024, 7/28 , 07/17/2024 Hemoglobin stable at 8.7 today- No evidence of overt GI bleeding thus far.   Continue IV Protonix .  He was evaluated by gastroenterologist on 07/13/2024.  GI signed off. 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  Continue enteral nutrition.  Monitor BMP.  AKI: Resolved  Hyperglycemia: Glucose levels are stable.  Hemoglobin A1c was 5.3 on 10/28/2022   Comorbidities include hypertension, hyperlipidemia, history of stroke   Diet Order             DIET DYS 3 Room service appropriate? Yes; Fluid consistency: Thin  Diet effective now                  Consultants: PCCM Gastroenterologist Cardiologist  Procedures: Intubated on 07/16/2024 and extubated on 07/18/2024 Left IJ central line placed on  07/16/2024  Medications:    amiodarone   200 mg Oral Daily   ascorbic acid   500 mg Oral Daily   aspirin   81 mg Oral Daily   budesonide  (PULMICORT ) nebulizer solution  0.5 mg Nebulization BID   clopidogrel   75 mg Oral Daily   dapagliflozin  propanediol  10 mg Oral Daily   enoxaparin  (LOVENOX ) injection  40 mg Subcutaneous QHS   feeding supplement  237 mL Oral TID BM   folic acid   1 mg Oral Daily   ipratropium-albuterol   3 mL Nebulization TID   iron  polysaccharides  150 mg Oral Daily   metoprolol  succinate  25 mg Oral Daily   multivitamin with minerals  1 tablet Oral Daily   nicotine   7 mg Transdermal Daily   mouth rinse  15 mL Mouth Rinse 4 times per day   pantoprazole   40 mg Oral BID AC   polyethylene glycol  17 g Oral Daily   rosuvastatin   20 mg Oral Daily   senna-docusate  2 tablet Oral QHS   thiamine   100 mg Oral Daily   torsemide   10 mg Oral Daily   Vitamin D  (Ergocalciferol )  50,000 Units Oral Q7 days   Continuous Infusions:  Anti-infectives (From admission, onward)    Start     Dose/Rate Route Frequency Ordered Stop   07/23/24 1400  ceFEPIme  (MAXIPIME ) 2 g in sodium chloride  0.9 % 100 mL IVPB  Status:  Discontinued        2 g 200 mL/hr over 30 Minutes Intravenous Every 8 hours 07/23/24 1203 07/24/24 1028   07/17/24 1000  piperacillin -tazobactam (ZOSYN ) IVPB 3.375 g  Status:  Discontinued        3.375 g 12.5 mL/hr over 240 Minutes Intravenous Every 8 hours 07/17/24 0811 07/21/24 1100   07/17/24 1000  vancomycin  (VANCOREADY) IVPB 2000 mg/400 mL  Status:  Discontinued        2,000 mg 200 mL/hr over 120 Minutes Intravenous  Once 07/17/24 0811 07/17/24 1026   07/14/24 1200  cefTRIAXone  (ROCEPHIN ) 2 g in sodium chloride  0.9 % 100 mL IVPB        2 g 200 mL/hr over 30 Minutes Intravenous Every 24 hours 07/14/24 1030 07/16/24 1204   07/12/24 1600  piperacillin -tazobactam (ZOSYN ) IVPB 3.375 g  Status:  Discontinued        3.375 g 12.5 mL/hr over 240 Minutes Intravenous Every 8  hours 07/12/24 1503 07/14/24 1030   07/12/24 0900  vancomycin  (VANCOREADY) IVPB 1250 mg/250 mL  Status:  Discontinued        1,250 mg 166.7 mL/hr over 90 Minutes Intravenous Every 12 hours 07/11/24 2012 07/12/24 1458   07/11/24 2100  ceFEPIme  (MAXIPIME ) 2 g in sodium chloride  0.9 % 100 mL IVPB  Status:  Discontinued        2 g 200 mL/hr over 30 Minutes Intravenous Every 8 hours 07/11/24 1959 07/12/24 1458   07/11/24 2100  vancomycin  (VANCOREADY) IVPB 2000 mg/400 mL        2,000 mg 200 mL/hr over 120 Minutes Intravenous  Once 07/11/24 2004 07/11/24 2255   07/11/24 1945  cefTRIAXone  (ROCEPHIN ) 1 g in sodium chloride  0.9 % 100 mL IVPB  Status:  Discontinued        1 g 200 mL/hr over 30 Minutes Intravenous  Once 07/11/24 1930 07/11/24 1933   07/11/24 1945  azithromycin  (ZITHROMAX ) 500 mg in sodium chloride  0.9 % 250 mL IVPB  Status:  Discontinued        500 mg 250 mL/hr over 60 Minutes Intravenous  Once 07/11/24 1930 07/11/24 1955   07/11/24 1945  cefTRIAXone  (ROCEPHIN ) 2 g in sodium chloride  0.9 % 100 mL IVPB  Status:  Discontinued        2 g 200 mL/hr over 30 Minutes Intravenous  Once 07/11/24 1933 07/11/24 1955       Family Communication/Anticipated D/C date and plan/Code Status    DVT prophylaxis: Lovenox      Code Status: Full Code  Family Communication: Discussed with patient  Disposition Plan: Plan to discharge to SNF   Status is: Inpatient Remains inpatient appropriate because: Acute respiratory failure, aspiration pneumonia   Subjective:  Patient was complaining of nasal irritation and some dry lips when seen today.  No chest pain or shortness of breath.  Still feeling weak.  Objective:    Vitals:   07/30/24 0809 07/30/24 1200 07/30/24 1403 07/30/24 1527  BP: 113/62 122/64  104/63  Pulse: 82 75  76  Resp: 16   18  Temp: 98.2 F (36.8 C)   97.9 F (36.6 C)  TempSrc:      SpO2: 100% 100% 99% 99%  Weight:      Height:       No data found.   Intake/Output  Summary (Last 24 hours) at 07/30/2024 1608 Last data filed at 07/29/2024 2207  Gross per 24 hour  Intake 480 ml  Output 800 ml  Net -320 ml   Filed Weights   07/28/24 0500 07/29/24 0500 07/30/24 0500  Weight: 97.6 kg 97.1 kg 97.4 kg    Exam: General.  Gentleman, in no acute distress. Pulmonary.  Lungs clear bilaterally, normal respiratory effort. CV.  Regular rate and rhythm, no JVD, rub or murmur. Abdomen.  Soft, nontender, nondistended, BS positive. CNS.  Alert and oriented .  No focal neurologic deficit. Extremities.  Trace LE edema, no cyanosis, pulses intact and symmetrical. Psychiatry.  Judgment and insight appears normal.   Data Reviewed:   I have personally reviewed following labs and imaging studies:  Labs: Labs show the following:   Basic Metabolic Panel: Recent Labs  Lab 07/24/24 0406 07/25/24 0439 07/25/24 1334 07/26/24 0426 07/26/24 1353 07/27/24 0536 07/28/24 0615 07/29/24 0324 07/30/24 0552  NA 141 144   < > 137 135 138 138 136 137  K 4.3 4.8   < > 3.7 3.6 3.2* 3.6 3.6 3.6  CL 99 99   < > 94* 95* 97* 99 96* 97*  CO2 33* 34*   < > 32 30 27 29 27 29   GLUCOSE 160* 125*   < > 170* 166* 115* 118* 102* 138*  BUN 48* 54*   < > 53* 49* 41* 35* 34* 41*  CREATININE 1.05 1.15   < > 1.38* 1.27* 1.39* 1.32* 1.46* 1.36*  CALCIUM  8.7* 8.5*   < > 8.6* 8.5* 8.8* 9.5 9.0 8.8*  MG  --  2.9*  --  2.4  --  2.0 1.9  --   --   PHOS 4.9* 5.0*  --  4.0  --   --   --   --   --    < > = values in this interval not displayed.   GFR Estimated Creatinine Clearance: 65.5 mL/min (A) (by C-G formula based on SCr of 1.36 mg/dL (H)). Liver Function Tests: Recent Labs  Lab 07/24/24 0406  ALBUMIN  2.9*   No results for input(s): LIPASE, AMYLASE in the last 168 hours. No results for input(s): AMMONIA in the last 168 hours.  Coagulation profile No results for input(s): INR, PROTIME in the last 168 hours.  CBC: Recent Labs  Lab 07/24/24 0406 07/25/24 0439  07/26/24 0426 07/27/24 0535 07/30/24 0552  WBC 15.7* 10.5 16.1* 13.4* 8.8  HGB 8.1* 7.7* 8.7* 8.7* 8.5*  HCT 27.5* 26.2* 28.3* 28.4* 27.2*  MCV 93.9 95.6 93.4 93.7 90.7  PLT 246 231 274 251 310   Cardiac Enzymes: No results for input(s): CKTOTAL, CKMB, CKMBINDEX, TROPONINI in the last 168 hours. BNP (last 3 results) No results for input(s): PROBNP in the last 8760 hours. CBG: Recent Labs  Lab 07/25/24 0813 07/25/24 1149 07/25/24 1615 07/26/24 0738 07/26/24 1254  GLUCAP 102* 127* 148* 128* 158*   D-Dimer: No results for input(s): DDIMER in the last 72 hours. Hgb A1c: No results for input(s): HGBA1C in the last 72 hours. Lipid Profile: No results for input(s): CHOL, HDL, LDLCALC, TRIG, CHOLHDL, LDLDIRECT in the last 72 hours.  Thyroid function studies: No results for input(s): TSH, T4TOTAL, T3FREE, THYROIDAB in the last 72 hours.  Invalid input(s): FREET3 Anemia work up: No results for input(s): VITAMINB12, FOLATE, FERRITIN, TIBC, IRON , RETICCTPCT in the last 72 hours. Sepsis Labs: Recent Labs  Lab 07/25/24 0439 07/26/24 0426 07/27/24 0535 07/30/24 0552  PROCALCITON  --   --  <0.10  --   WBC 10.5  16.1* 13.4* 8.8    Microbiology Recent Results (from the past 240 hours)  Culture, Respiratory w Gram Stain     Status: None   Collection Time: 07/23/24 11:32 AM   Specimen: Sputum  Result Value Ref Range Status   Specimen Description   Final    SPU Performed at [facility], [address], [place], [place] [address]    Special Requests   Final    NONE Performed at [facility], [address] [place], [place] [address]    Gram Stain   Final    RARE WBC PRESENT, PREDOMINANTLY PMN RARE GRAM POSITIVE COCCI    Culture   Final    Normal respiratory flora-no Staph aureus or Pseudomonas seen Performed at [facility], [address], [place], [place] [address]    Report Status 07/25/2024  FINAL  Final  MRSA Next Gen by PCR, Nasal     Status: None   Collection Time: 07/24/24 11:08 AM   Specimen: Nasal Mucosa; Nasal Swab  Result Value Ref Range Status   MRSA by PCR Next Gen NOT DETECTED NOT DETECTED Final    Comment: (NOTE) The GeneXpert MRSA Assay (FDA approved for NASAL specimens only), is one component of a comprehensive MRSA colonization surveillance program. It is not intended to diagnose MRSA infection nor to guide or monitor treatment for MRSA infections. Test performance is not FDA approved in patients less than 60 years old. Performed at [facility], [address], [place], [place] [address]     Procedures and diagnostic studies:  DG Chest [facility] 1 View Result Date: 07/29/2024 CLINICAL DATA:  Congestive heart failure. EXAM: PORTABLE CHEST 1 VIEW COMPARISON:  07/24/2024 FINDINGS: The enteric tube has been removed. Post median sternotomy with stable cardiomegaly. Bilateral pleural effusions and bibasilar opacities, slightly worsened in the interim. Improved interstitial opacities with mild residual. Underlying emphysematous change. No pneumothorax. IMPRESSION: 1. Bilateral pleural effusions and bibasilar opacities, slightly worsened in the interim. 2. Improved interstitial opacities with mild residual. Electronically Signed   By: Andrea Gasman M.D.   On: 07/29/2024 09:53      LOS: 19 days   Amaryllis Dare MD  [facility]   Pager on [email or web address]. If 7PM-7AM, please contact night-coverage at [email or web address]  07/30/2024, 4:08 PM

## 2024-07-30 NOTE — Plan of Care (Signed)
  Problem: Education: Goal: Knowledge of General Education information will improve Description: Including pain rating scale, medication(s)/side effects and non-pharmacologic comfort measures Outcome: Progressing   Problem: Health Behavior/Discharge Planning: Goal: Ability to manage health-related needs will improve Outcome: Progressing   Problem: Clinical Measurements: Goal: Ability to maintain clinical measurements within normal limits will improve Outcome: Progressing Goal: Will remain free from infection Outcome: Progressing Goal: Diagnostic test results will improve Outcome: Progressing Goal: Respiratory complications will improve Outcome: Progressing Goal: Cardiovascular complication will be avoided Outcome: Progressing   Problem: Activity: Goal: Risk for activity intolerance will decrease Outcome: Progressing   Problem: Nutrition: Goal: Adequate nutrition will be maintained Outcome: Progressing   Problem: Coping: Goal: Level of anxiety will decrease Outcome: Progressing   Problem: Elimination: Goal: Will not experience complications related to bowel motility Outcome: Progressing Goal: Will not experience complications related to urinary retention Outcome: Progressing   Problem: Pain Managment: Goal: General experience of comfort will improve and/or be controlled Outcome: Progressing   Problem: Safety: Goal: Ability to remain free from injury will improve Outcome: Progressing   Problem: Skin Integrity: Goal: Risk for impaired skin integrity will decrease Outcome: Progressing   Problem: Education: Goal: Knowledge of disease or condition will improve Outcome: Progressing Goal: Knowledge of the prescribed therapeutic regimen will improve Outcome: Progressing Goal: Individualized Educational Video(s) Outcome: Progressing   Problem: Activity: Goal: Ability to tolerate increased activity will improve Outcome: Progressing Goal: Will verbalize the  importance of balancing activity with adequate rest periods Outcome: Progressing   Problem: Respiratory: Goal: Ability to maintain a clear airway will improve Outcome: Progressing Goal: Levels of oxygenation will improve Outcome: Progressing Goal: Ability to maintain adequate ventilation will improve Outcome: Progressing   Problem: Activity: Goal: Ability to tolerate increased activity will improve Outcome: Progressing   Problem: Clinical Measurements: Goal: Ability to maintain a body temperature in the normal range will improve Outcome: Progressing   Problem: Respiratory: Goal: Ability to maintain adequate ventilation will improve Outcome: Progressing Goal: Ability to maintain a clear airway will improve Outcome: Progressing   Problem: Activity: Goal: Ability to tolerate increased activity will improve Outcome: Progressing   Problem: Respiratory: Goal: Ability to maintain a clear airway and adequate ventilation will improve Outcome: Progressing   Problem: Role Relationship: Goal: Method of communication will improve Outcome: Progressing

## 2024-07-30 NOTE — Progress Notes (Signed)
 Increased time spent this date for progressive mobility. Pt participated in bed mobility, transfer training, and modified gait training with RW. SpO2 93% on baseline 2L O2 upon exertion. Pt progressing well. Will continue per POC.  07/28/24 1500  PT Visit Information  Assistance Needed +1  History of Present Illness 63 year old male patient admitted to the ICU with severe alcohol  withdrawal Dts requiring multiple doses of Phenobarb and eventually precedex  drip. Unfortunately course complicated by an aspriation event requiring intubation and mechanical ventilation with course c/b peri intubation brief cardiac arrest requiring one round of CPR.  Pt ultimately needing to be extubated ~24 hours.  Subjective Data  Patient Stated Goal get stronger  Precautions  Precautions Fall  Recall of Precautions/Restrictions Intact  Restrictions  Weight Bearing Restrictions Per Provider Order No  Pain Assessment  Pain Assessment No/denies pain  Cognition  Arousal Alert  Behavior During Therapy [facility] for tasks assessed/performed  Following Commands  Following commands Intact  Following commands impaired Follows one step commands with increased time  Cueing  Cueing Techniques Verbal cues;Gestural cues;Tactile cues  Communication  Communication Impaired  Factors Affecting Communication Hearing impaired  Bed Mobility  Overal bed mobility Needs Assistance  Bed Mobility Supine to Sit  Supine to sit Min assist;HOB elevated;Used rails  General bed mobility comments Improved ability to transfer to EOB this date  Transfers  Overall transfer level Needs assistance  Equipment used Rolling walker (2 wheels)  Transfers Sit to/from Stand  Sit to Stand Contact guard assist  General transfer comment Cues for hand placement to transition to standing. MinA to gain initial standing balance at RW.  Ambulation/Gait  Ambulation/Gait assistance Min assist;Contact guard assist  Gait Distance (Feet)  (10)  Assistive  device Rolling walker (2 wheels)  Gait Pattern/deviations Step-to pattern;Decreased step length - right;Decreased step length - left  General Gait Details  (Pt remained on 2L O2, SpO2 remained 93% and above throughout mobility. No dizziness or SOB)  Gait velocity decreased  Balance  Overall balance assessment Needs assistance  Sitting-balance support Feet supported  Sitting balance-Leahy Scale Good  Standing balance support Bilateral upper extremity supported;During functional activity;Reliant on assistive device for balance  Standing balance-Leahy Scale Fair  Standing balance comment Heavy reliant on RW with BilUE during amb  General Comments  General comments (skin integrity, edema, etc.)  (Pt educated on role of PT, discussed home set up and recs for STR once medically stable)  Exercises  Exercises General Lower Extremity  General Exercises - Lower Extremity  Ankle Circles/Pumps AROM;Both;10 reps  Long Arc Quad AROM;Both;10 reps;Seated  Hip Flexion/Marching AROM;Both;10 reps;Seated  PT - End of Session  Equipment Utilized During Treatment Gait belt;Oxygen (2L O2)  Activity Tolerance Patient tolerated treatment well  Patient left in chair;with call bell/phone within reach;with chair alarm set  Nurse Communication Mobility status   PT - Assessment/Plan  PT Visit Diagnosis Muscle weakness (generalized) (M62.81);Difficulty in walking, not elsewhere classified (R26.2);Unsteadiness on feet (R26.81)  PT Frequency (ACUTE ONLY) Min 2X/week  Follow Up Recommendations Skilled nursing-short term rehab (<3 hours/day)  Can patient physically be transported by private vehicle No  Patient can return home with the following A little help with walking and/or transfers;A lot of help with bathing/dressing/bathroom;Assistance with cooking/housework;Assist for transportation;Help with stairs or ramp for entrance  PT equipment Rolling walker (2 wheels)  AM-PAC PT 6 Clicks Mobility Outcome Measure  (Version 2)  Help needed turning from your back to your side while in a flat bed without  using bedrails? 3  Help needed moving from lying on your back to sitting on the side of a flat bed without using bedrails? 3  Help needed moving to and from a bed to a chair (including a wheelchair)? 3  Help needed standing up from a chair using your arms (e.g., wheelchair or bedside chair)? 2  Help needed to walk in hospital room? 2  Help needed climbing 3-5 steps with a railing?  2  6 Click Score 15  Consider Recommendation of Discharge To: CIR/SNF/LTACH  Progressive Mobility  What is the highest level of mobility based on the mobility assessment? Level 4 (Ambulates with assistance) - Balance while stepping forward/back - Complete  Activity Ambulated with assistance  PT Goal Progression  Progress towards PT goals Progressing toward goals  PT Time Calculation  PT Start Time (ACUTE ONLY) 1404  PT Stop Time (ACUTE ONLY) 1450  PT Time Calculation (min) (ACUTE ONLY) 46 min  PT General Charges  $$ ACUTE PT VISIT 1 Visit  PT Treatments  $Gait Training 8-22 mins  $Therapeutic Exercise 8-22 mins  $Therapeutic Activity 8-22 mins  Darice Bohr, PTA

## 2024-07-30 NOTE — TOC Progression Note (Addendum)
 Transition of Care [facility]) - Progression Note    Patient Details  Name: [name] MRN: [id number] Date of Birth: 10-Jul-1961  Transition of Care [facility]) CM/SW Contact  [name], LCSW Phone Number: 07/30/2024, 12:35 PM  Clinical Narrative:   Patient has accepted bed offer from [facility]. Admissions coordinator is aware. CSW started insurance authorization. Patient is not bedbound so he would not qualify for ambulance transport to the SNF. He said his friend should be able to transport. Patient confirmed he has oxygen at home. CSW encouraged him to ask friend to bring his oxygen for the ride.  4:38 pm: Auth still pending.  Expected Discharge Plan and Services                                               Social Drivers of Health (SDOH) Interventions SDOH Screenings   Food Insecurity: No Food Insecurity (07/12/2024)  Housing: Low Risk  (07/12/2024)  Transportation Needs: No Transportation Needs (07/12/2024)  Utilities: Not At Risk (07/12/2024)  Social Connections: Socially Isolated (07/12/2024)  Tobacco Use: High Risk (07/11/2024)    Readmission Risk Interventions    05/14/2024    8:44 AM 10/27/2022   11:28 AM  Readmission Risk Prevention Plan  Transportation Screening  Complete  PCP or Specialist Appt within 5-7 Days  Complete  PCP or Specialist Appt within 3-5 Days Complete   Home Care Screening  Complete  Medication Review (RN CM)  Complete  HRI or Home Care Consult Complete   Social Work Consult for Recovery Care Planning/Counseling Complete   Palliative Care Screening Not Applicable   Medication Review Oceanographer) Complete

## 2024-07-30 NOTE — Progress Notes (Signed)
 This chaplain has put in phone call to on call notary for completion-will follow up when able to meet in room with witnesses and notary

## 2024-07-30 NOTE — Progress Notes (Signed)
 [facility] CLINIC CARDIOLOGY PROGRESS NOTE       Patient ID: Ryo Klang MRN: [id number] DOB/AGE: January 05, 1961 63 y.o.  Admit date: 07/11/2024 Referring Physician Dr. Charlie Sellar Primary Physician Osa Geralds, NP  Primary Cardiologist Tinnie Maiden, NP  Reason for Consultation elevated troponins  HPI: Lorence Nagengast is a 63 y.o. 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 Found to have pneumonia, overnight troponins uptrending. Cardiology was consulted for further evaluation.   Interval history: -Patient seen and examined this AM, resting in bedside chair in no acute distress. Patient states breathing feels a lot better today. Denies chest pain/pressure, palpitations or lightheadedness. -Patient BP and HR stable this AM. Per tele has 1 episode of bigeminy on tele on 08/31. -Pending SNF placement  -On 2L with stable SpO2.  Review of systems complete and found to be negative unless listed above    Past Medical History:  Diagnosis Date   COPD (chronic obstructive pulmonary disease) (HCC)    CVA (cerebral infarction)    Headache    mild, since stroke 2012   Hypertension    MI (myocardial infarction) (HCC) 05/09/2012   Reading difficulty    pt reports he reads at about a second grade level   Stroke [facility] 2012   numbness to left hand    Wears dentures    full upper and lower    Past Surgical History:  Procedure Laterality Date   CAROTID ENDARTERECTOMY Left    2012 or 2013   CHOLECYSTECTOMY N/A 07/26/2016   Procedure: LAPAROSCOPIC CHOLECYSTECTOMY;  Surgeon: Charlie FORBES Fell, MD;  Location: [facility] ORS;  Service: General;  Laterality: N/A;    COLONOSCOPY N/A 05/08/2024   Procedure: COLONOSCOPY;  Surgeon: Jinny Carmine, MD;  Location: [facility] ENDOSCOPY;  Service: Endoscopy;  Laterality: N/A;   COLONOSCOPY WITH PROPOFOL  N/A 02/09/2022   Procedure: COLONOSCOPY WITH PROPOFOL ;  Surgeon: Jinny Carmine, MD;  Location: [facility] ENDOSCOPY;  Service: Endoscopy;  Laterality: N/A;   CORONARY ARTERY BYPASS GRAFT  05/10/2012   3 vessel   CORONARY STENT INTERVENTION N/A 07/02/2024   Procedure: CORONARY STENT INTERVENTION;  Surgeon: Katina Albright, MD;  Location: [facility] INVASIVE CV LAB;  Service: Cardiovascular;  Laterality: N/A;   CORONARY ULTRASOUND/IVUS N/A 07/02/2024   Procedure: Coronary Ultrasound/IVUS;  Surgeon: Katina Albright, MD;  Location: [facility] INVASIVE CV LAB;  Service: Cardiovascular;  Laterality: N/A;   ENTEROSCOPY N/A 05/16/2023   Procedure: ENTEROSCOPY;  Surgeon: Therisa Bi, MD;  Location: [facility] ENDOSCOPY;  Service: Gastroenterology;  Laterality: N/A;   ENTEROSCOPY N/A 06/01/2023   Procedure: ENTEROSCOPY;  Surgeon: Unk Corinn Skiff, MD;  Location: [facility] ENDOSCOPY;  Service: Gastroenterology;  Laterality: N/A;   ESOPHAGOGASTRODUODENOSCOPY  05/13/2023   Procedure: ESOPHAGOGASTRODUODENOSCOPY (EGD);  Surgeon: Jinny Carmine, MD;  Location: [facility] ENDOSCOPY;  Service: Endoscopy;;   ESOPHAGOGASTRODUODENOSCOPY (EGD) WITH PROPOFOL  N/A 10/28/2022   Procedure: ESOPHAGOGASTRODUODENOSCOPY (EGD) WITH PROPOFOL ;  Surgeon: Onita Elspeth Sharper, DO;  Location: [facility] ENDOSCOPY;  Service: Gastroenterology;  Laterality: N/A;   GIVENS CAPSULE STUDY  05/13/2023   Procedure: GIVENS CAPSULE STUDY;  Surgeon: Jinny Carmine, MD;  Location: [facility] ENDOSCOPY;  Service: Endoscopy;;   GIVENS CAPSULE STUDY  06/01/2023   Procedure: GIVENS CAPSULE STUDY;  Surgeon: Unk Corinn Skiff, MD;  Location: [facility] ENDOSCOPY;  Service: Gastroenterology;;   LEFT HEART CATH AND CORS/GRAFTS ANGIOGRAPHY N/A 07/02/2024   Procedure: LEFT HEART CATH AND CORS/GRAFTS ANGIOGRAPHY;  Surgeon: Katina Albright, MD;  Location:  [facility] INVASIVE CV LAB;  Service: Cardiovascular;  Laterality: N/A;    Medications Prior to Admission  Medication Sig Dispense Refill Last Dose/Taking   albuterol  (VENTOLIN  HFA) 108 (90 Base) MCG/ACT inhaler Inhale 2 puffs into the lungs every 6 (six) hours as needed for wheezing or shortness of breath. 6.7 g 2 07/11/2024 Morning   ascorbic acid  (VITAMIN C ) 500 MG tablet Take 1 tablet (500 mg total) by mouth daily. 30 tablet 2 07/11/2024 Morning   aspirin  EC 81 MG tablet Take 1 tablet (81 mg total) by mouth daily. Swallow whole. 30 tablet 5 07/11/2024 Morning   clopidogrel  (PLAVIX ) 75 MG tablet Take 1 tablet (75 mg total) by mouth daily with breakfast. 30 tablet 11 07/11/2024 Morning   dapagliflozin  propanediol (FARXIGA ) 10 MG TABS tablet Take 1 tablet (10 mg total) by mouth daily. 30 tablet 11 07/11/2024 Morning   folic acid  (FOLVITE ) 1 MG tablet Take 1 tablet (1 mg total) by mouth daily. 30 tablet 2 07/11/2024   iron  polysaccharides (NIFEREX) 150 MG capsule Take 1 capsule (150 mg total) by mouth daily. 30 capsule 2 07/11/2024   isosorbide  mononitrate (IMDUR ) 60 MG 24 hr tablet Take 1 tablet (60 mg total) by mouth daily. 30 tablet 11 07/11/2024   losartan  (COZAAR ) 25 MG tablet Take 0.5 tablets (12.5 mg total) by mouth daily. 30 tablet 11 07/11/2024 Morning   metoprolol  succinate (TOPROL -XL) 25 MG 24 hr tablet Take 0.5 tablets (12.5 mg total) by mouth daily. 30 tablet 11 07/11/2024 Morning   pantoprazole  (PROTONIX ) 40 MG tablet Take 1 tablet (40 mg total) by mouth daily. 30 tablet 2 07/11/2024 Morning   pravastatin  (PRAVACHOL ) 20 MG tablet Take 1 tablet (20 mg total) by mouth at bedtime. 30 tablet 11 07/10/2024 Evening   spironolactone  (ALDACTONE ) 25 MG tablet Take 0.5 tablets (12.5 mg total) by mouth daily. 30 tablet 11 07/11/2024 Morning   torsemide  (DEMADEX ) 10 MG tablet Take 1 tablet (10 mg total) by mouth daily. 30 tablet 11 07/11/2024   Social History   Socioeconomic History   Marital status: Divorced     Spouse name: Not on file   Number of children: Not on file   Years of education: Not on file   Highest education level: Not on file  Occupational History   Not on file  Tobacco Use   Smoking status: Former    Current packs/day: 0.00    Types: Cigarettes    Quit date: 33    Years since quitting: 32.6   Smokeless tobacco: Current    Types: Snuff   Tobacco comments:    changed to dip  Vaping Use   Vaping status: Never Used  Substance and Sexual Activity   Alcohol  use: Yes    Alcohol /week: 26.0 standard drinks of alcohol     Types: 26 Cans of beer per week   Drug use: No   Sexual activity: Yes    Birth control/protection: None  Other Topics Concern   Not on file  Social History Narrative   Not on file   Social Drivers of Health   Financial Resource Strain: Not on file  Food Insecurity: No Food Insecurity (07/12/2024)   Hunger Vital Sign    Worried About Running Out of Food in the Last Year: Never  true    Ran Out of Food in the Last Year: Never true  Transportation Needs: No Transportation Needs (07/12/2024)   PRAPARE - Administrator, Civil Service (Medical): No    Lack of Transportation (Non-Medical): No  Physical Activity: Not on file  Stress: Not on file  Social Connections: Socially Isolated (07/12/2024)   Social Connection and Isolation Panel    Frequency of Communication with Friends and Family: Once a week    Frequency of Social Gatherings with Friends and Family: Once a week    Attends Religious Services: Never    Database administrator or Organizations: No    Attends Banker Meetings: Never    Marital Status: Divorced  Catering manager Violence: Not At Risk (07/12/2024)   Humiliation, Afraid, Rape, and Kick questionnaire    Fear of Current or Ex-Partner: No    Emotionally Abused: No    Physically Abused: No    Sexually Abused: No    Family History  Problem Relation Age of Onset   Pneumonia Mother      Vitals:   07/30/24 0422  07/30/24 0500 07/30/24 0749 07/30/24 0809  BP: (!) 111/50   113/62  Pulse: 84   82  Resp: 20   16  Temp: 97.6 F (36.4 C)   98.2 F (36.8 C)  TempSrc:      SpO2: 100%  100% 100%  Weight:  97.4 kg    Height:        PHYSICAL EXAM General: Ill appearing male, no acute distress. HEENT: Normocephalic and atraumatic. Neck: No JVD.  Lungs: Normal respiratory effort on 2L.  Diminished breath sounds bilaterally. Heart: HRRR, Normal S1 and S2 without gallops. +systolic ejection murmur.   Abdomen: Non-distended appearing.  Msk: Normal strength and tone for age. Extremities: Warm and well perfused. No clubbing, cyanosis, trace pedal edema.   Labs: Basic Metabolic Panel: Recent Labs    07/28/24 0615 07/29/24 0324 07/30/24 0552  NA 138 136 137  K 3.6 3.6 3.6  CL 99 96* 97*  CO2 29 27 29   GLUCOSE 118* 102* 138*  BUN 35* 34* 41*  CREATININE 1.32* 1.46* 1.36*  CALCIUM  9.5 9.0 8.8*  MG 1.9  --   --    Liver Function Tests: No results for input(s): AST, ALT, ALKPHOS, BILITOT, PROT, ALBUMIN  in the last 72 hours.  No results for input(s): LIPASE, AMYLASE in the last 72 hours. CBC: Recent Labs    07/30/24 0552  WBC 8.8  HGB 8.5*  HCT 27.2*  MCV 90.7  PLT 310   Cardiac Enzymes: No results for input(s): CKTOTAL, CKMB, CKMBINDEX, TROPONINIHS in the last 72 hours.    BNP: Recent Labs    07/29/24 0752  BNP 977.9*     D-Dimer: No results for input(s): DDIMER in the last 72 hours. Hemoglobin A1C: No results for input(s): HGBA1C in the last 72 hours. Fasting Lipid Panel: No results for input(s): CHOL, HDL, LDLCALC, TRIG, CHOLHDL, LDLDIRECT in the last 72 hours.  Thyroid Function Tests: No results for input(s): TSH, T4TOTAL, T3FREE, THYROIDAB in the last 72 hours.  Invalid input(s): FREET3 Anemia Panel: No results for input(s): VITAMINB12, FOLATE, FERRITIN, TIBC, IRON , RETICCTPCT in the last 72 hours.    Radiology: [facility] Chest Port 1 View Result Date: 07/29/2024 CLINICAL DATA:  Congestive heart failure. EXAM: PORTABLE CHEST 1 VIEW COMPARISON:  07/24/2024 FINDINGS: The enteric tube has been removed. Post median sternotomy with stable cardiomegaly. Bilateral pleural effusions and  bibasilar opacities, slightly worsened in the interim. Improved interstitial opacities with mild residual. Underlying emphysematous change. No pneumothorax. IMPRESSION: 1. Bilateral pleural effusions and bibasilar opacities, slightly worsened in the interim. 2. Improved interstitial opacities with mild residual. Electronically Signed   By: Andrea Gasman M.D.   On: 07/29/2024 09:53   DG Chest 1 View Result Date: 07/24/2024 CLINICAL DATA:  [id number] Dyspnea [id number] EXAM: CHEST  1 VIEW COMPARISON:  July 23, 2024, July 11, 2024 FINDINGS: Esophagogastric tube terminates below the diaphragm, outside the field of view. Diffuse interstitial opacities throughout both lungs. No lobar consolidation or pneumothorax. Lateral right upper lobe nodular opacity measures 1.9 cm, corresponding to the lung nodule on the prior chest CT. Moderate cardiomegaly. Sternotomy wires and CABG markers. No acute fracture or destructive lesions. Multilevel thoracic osteophytosis. IMPRESSION: 1. Moderate cardiomegaly. Unchanged interstitial opacities throughout both lungs, possibly persistent interstitial edema and underlying emphysematous changes. 2. Similarly appearing lateral right upper lung zone nodular opacity. Please see the comparison chest CT for further characterization. Electronically Signed   By: Rogelia Myers M.D.   On: 07/24/2024 08:43   DG Chest Port 1 View Result Date: 07/23/2024 CLINICAL DATA:  Shortness of breath. EXAM: PORTABLE CHEST 1 VIEW COMPARISON:  July 22, 2024. FINDINGS: The heart size and mediastinal contours are within normal limits. Status post coronary bypass graft. Mild diffuse interstitial densities are noted concerning for possible  pulmonary edema with possible small pleural effusions. Nasogastric tube is seen entering stomach. The visualized skeletal structures are unremarkable. IMPRESSION: Mild diffuse interstitial densities are noted concerning for possible pulmonary edema with small pleural effusions. Electronically Signed   By: Lynwood Landy Raddle M.D.   On: 07/23/2024 13:03   DG ABD ACUTE 2+V W 1V CHEST Result Date: 07/22/2024 CLINICAL DATA:  [id number] Dyspnea [id number] [phone] Encounter for imaging study to confirm nasogastric (NG) tube placement [id number] EXAM: DG ABDOMEN ACUTE WITH 1 VIEW CHEST COMPARISON:  Chest x-ray 07/19/2024 FINDINGS: Enteric tube courses below the diaphragm with tip and side port overlying the expected region of the gastric lumen. The heart and mediastinal contours are within normal limits. No focal consolidation. Chronic coarsened interstitial markings with no overt pulmonary edema. Bilateral trace pleural effusion. No pneumothorax. Right upper quadrant surgical clips. There is no evidence of dilated bowel loops or free intraperitoneal air. No radiopaque calculi or other significant radiographic abnormality is seen. No acute osseous abnormality.  Intact sternotomy wires. IMPRESSION: 1. Enteric tube in good position. 2. Bowel trace pleural effusions. 3. Nonobstructive bowel gas pattern. Electronically Signed   By: Morgane  Naveau M.D.   On: 07/22/2024 14:18   DG Chest Port 1 View Result Date: 07/19/2024 CLINICAL DATA:  Dyspnea EXAM: PORTABLE CHEST 1 VIEW COMPARISON:  07/18/2024 FINDINGS: Two frontal views of the chest demonstrate an enteric catheter passing below diaphragm, tip excluded by collimation. Left internal jugular catheter tip overlies superior vena cava unchanged. 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 The cardiac silhouette is stable. Mild central vascular congestion, with a trace right pleural effusion unchanged since prior exam. The nodular density projecting over the right upper lateral chest on prior study  is less well seen on this exam. This may be due to positioning. No pneumothorax. No acute bony abnormalities. IMPRESSION: 1. Pulmonary vascular congestion and small right pleural effusion, unchanged since prior study. 2. The nodular consolidation within the right upper lateral chest seen on prior x-ray and CT is not well seen on this exam, likely due to patient positioning. Electronically Signed  By: Ozell Daring M.D.   On: 07/19/2024 17:34   DG Chest Port 1 View Result Date: 07/18/2024 EXAM: 1 VIEW XRAY OF THE CHEST 07/18/2024 04:59:06 AM COMPARISON: 07/16/2024 CLINICAL HISTORY: [id number] Acute respiratory failure with hypoxia (HCC) 427266. Acute respiratory failure with hypoxia FINDINGS: LUNGS AND PLEURA: Nodular opacity within the periphery of the right upper lobe appears similar to CT from 07/11/2024. Please refer to that report for follow-up recommendations. Mild asymmetric hazy opacification over the right mid and right lower lung may be related to positional artifact versus small posterior layering effusion. Mild subsegmental atelectasis in the left base. HEART AND MEDIASTINUM: Stable cardiomediastinal contours. Previous median sternotomy and CABG procedure. BONES AND SOFT TISSUES: Aortic atherosclerotic calcification. Left IJ catheter is identified with the tip in the projection of the SVC. There is an enteric tube which courses below the field of view. ET tube tip is stable above the carina. IMPRESSION: 1. Stable nodular opacity within the periphery of the right upper lobe, similar to CT from 07/11/2024. Please refer to that report for follow-up recommendations. 2. Mild asymmetric hazy opacification over the right mid and right lower lung, possibly related to rotational artifact versus small posterior layering effusion. 3. Mild subsegmental atelectasis in the left base. Electronically signed by: Waddell Calk MD 07/18/2024 06:51 AM EDT RP Workstation: [id number]   DG Chest Port 1 View Result Date:  07/16/2024 CLINICAL DATA:  Endotracheal tube and central line placement. EXAM: PORTABLE CHEST 1 VIEW COMPARISON:  07/16/2024 at 8:58 a.m. FINDINGS: Patient is rotated to the right. Endotracheal tube has tip proximally 13.5 cm above the carina. This could be advanced approximately 6 cm. Left IJ central venous catheter has tip over the SVC. Enteric tube courses into the region of the stomach and off the image as tip is not visualized. Lungs are adequately inflated with possible mild hazy opacification over the right upper lung abutting the minor fissure which may be due to atelectasis or infection. Left lung is clear. Cardiomediastinal silhouette and remainder of the exam is unchanged. IMPRESSION: 1. Endotracheal tube has tip proximally 13.5 cm above the carina. This could be advanced approximately 6 cm. 2. Left IJ central venous catheter with tip over the SVC. 3. Possible mild hazy opacification over the right upper lung abutting the minor fissure which may be due to atelectasis or infection. Electronically Signed   By: Toribio Agreste M.D.   On: 07/16/2024 10:53   DG Chest Port 1 View Result Date: 07/16/2024 CLINICAL DATA:  [id number] Aspiration into airway [id number]. EXAM: PORTABLE CHEST 1 VIEW COMPARISON:  06/23/2024. FINDINGS: Bilateral lungs appear hyperlucent with coarse bronchovascular markings, in keeping with COPD. Bilateral lungs otherwise appear clear. No dense consolidation or lung collapse. Bilateral costophrenic angles are clear. Stable cardio-mediastinal silhouette. There are surgical staples along the heart border and sternotomy wires, status post CABG (coronary artery bypass graft). No acute osseous abnormalities. The soft tissues are within normal limits. Enteric tube is seen coursing below the left hemidiaphragm however, the tip is not included on the film. IMPRESSION: No active disease. COPD. Electronically Signed   By: Ree Molt M.D.   On: 07/16/2024 09:09   DG Abd 1 View Result Date:  07/16/2024 CLINICAL DATA:  Vomiting.  NG tube placement. EXAM: ABDOMEN - 1 VIEW COMPARISON:  07/15/2024 FINDINGS: Nasogastric tube has tip and side-port over the stomach in the upper abdomen just left of midline. Visualized lower thorax unchanged. Abdominopelvic images demonstrate a non obstructive bowel gas pattern. Mild fecal retention  throughout the colon. Surgical clips over the right upper quadrant. There are degenerative changes of the spine and hips. Calcified plaque over the abdominal aorta and iliac arteries. IMPRESSION: 1. Nasogastric tube with tip and side-port over the stomach. 2. Nonobstructive bowel gas pattern. Mild fecal retention. 3. Aortic atherosclerosis. Electronically Signed   By: Toribio Agreste M.D.   On: 07/16/2024 07:51   DG Abd 1 View Result Date: 07/15/2024 CLINICAL DATA:  Orogastric tube placement. EXAM: ABDOMEN - 1 VIEW COMPARISON:  June 29, 2023. FINDINGS: Distal tip of nasogastric tube is seen in expected position of proximal stomach. IMPRESSION: Nasogastric tube tip seen in expected position of proximal stomach. Electronically Signed   By: Lynwood Landy Raddle M.D.   On: 07/15/2024 10:25   DG Chest Port 1 View Result Date: 07/13/2024 CLINICAL DATA:  Respiratory failure with hypoxia. EXAM: PORTABLE CHEST 1 VIEW COMPARISON:  Multiple previous chest x-rays and recent chest CT. FINDINGS: Stable surgical changes from bypass surgery. The cardiac silhouette, mediastinal and hilar contours are within normal limits and stable. Underlying emphysematous changes and pulmonary scarring. No pleural effusions or discrete pulmonary infiltrates. No pneumothorax. IMPRESSION: Emphysematous changes and pulmonary scarring but no acute overlying pulmonary process. Electronically Signed   By: MYRTIS Stammer M.D.   On: 07/13/2024 16:08   US  Abdomen Limited RUQ (LIVER/GB) Result Date: 07/13/2024 CLINICAL DATA:  Portal hypertension. EXAM: ULTRASOUND ABDOMEN LIMITED RIGHT UPPER QUADRANT COMPARISON:  Abdominal  ultrasound 10/19/2022 FINDINGS: Gallbladder: Surgically absent. Common bile duct: Diameter: 3.3 mm Liver: No focal lesion identified. Parenchymal echogenicity is increased and diffusely heterogeneous. There is lobular liver contour. Portal vein is patent on color Doppler imaging with normal direction of blood flow towards the liver. Other: None. IMPRESSION: Lobular liver contour with increased echogenicity and heterogeneous parenchyma consistent with cirrhosis. No focal liver lesion identified. Electronically Signed   By: Greig Pique M.D.   On: 07/13/2024 16:03   DG Chest Port 1 View Result Date: 07/12/2024 CLINICAL DATA:  Shortness of breath. EXAM: PORTABLE CHEST 1 VIEW COMPARISON:  07/11/2024 FINDINGS: The cardio pericardial silhouette is enlarged. There is pulmonary vascular congestion without overt pulmonary edema. Trace bilateral pleural effusions. Old posterior left sixth rib fracture. Telemetry leads overlie the chest. IMPRESSION: Enlargement of the cardiopericardial silhouette with new pulmonary vascular congestion and trace bilateral pleural effusions. Electronically Signed   By: Camellia Candle M.D.   On: 07/12/2024 12:11   CT Angio Chest PE W/Cm &/Or Wo Cm Result Date: 07/11/2024 CLINICAL DATA:  High probability for PE. Chest pain with shortness of breath EXAM: CT ANGIOGRAPHY CHEST WITH CONTRAST TECHNIQUE: Multidetector CT imaging of the chest was performed using the standard protocol during bolus administration of intravenous contrast. Multiplanar CT image reconstructions and MIPs were obtained to evaluate the vascular anatomy. RADIATION DOSE REDUCTION: This exam was performed according to the departmental dose-optimization program which includes automated exposure control, adjustment of the mA and/or kV according to patient size and/or use of iterative reconstruction technique. CONTRAST:  75mL OMNIPAQUE  IOHEXOL  350 MG/ML SOLN COMPARISON:  CT angiogram chest 05/09/2024 FINDINGS: Cardiovascular:  Satisfactory opacification of the pulmonary arteries to the segmental level. No evidence of pulmonary embolism. Patient is status post cardiac surgery. The heart is enlarged. There is no pericardial effusion. There are atherosclerotic calcifications of the aorta and coronary arteries. Aorta is normal in size. Mediastinum/Nodes: There are enlarged right hilar lymph nodes measuring up to 11 mm. Enlarged paratracheal lymph nodes measure up to 10 mm. Visualized thyroid gland and esophagus  are within normal limits. Lungs/Pleura: Moderate emphysema again seen. There is stable scarring in the right lung apex. There is new patchy slightly nodular airspace disease in the right upper lobe measuring 2.0 by 1.1 cm. There is right-sided central peribronchial wall thickening. Previously identified airspace disease in the left lower lobe has resolved. There is no pleural effusion or pneumothorax. There is a stable left lower lobe pulmonary nodule image 6/57. Upper Abdomen: No acute abnormality. Musculoskeletal: No chest wall abnormality. No acute or significant osseous findings. Review of the MIP images confirms the above findings. IMPRESSION: 1. No evidence for pulmonary embolism. 2. New patchy slightly nodular airspace disease in the right upper lobe worrisome for pneumonia. Follow-up CT recommended in 4-6 weeks recommended to ensure resolution and to exclude underlying nodule. 3. Right-sided central peribronchial wall thickening worrisome for bronchitis. 4. Right hilar and mediastinal lymphadenopathy, likely reactive. 5. Stable left lower lobe pulmonary nodule. 6. Cardiomegaly. Aortic Atherosclerosis (ICD10-I70.0) and Emphysema (ICD10-J43.9). Electronically Signed   By: Greig Pique M.D.   On: 07/11/2024 18:54   DG Chest Portable 1 View Result Date: 07/11/2024 CLINICAL DATA:  SOB EXAM: PORTABLE CHEST - 1 VIEW COMPARISON:  June 24, 2024 FINDINGS: Biapical pleural thickening. Sternotomy wires and CABG markers. No focal  airspace consolidation, pleural effusion, or pneumothorax. Mild cardiomegaly. Tortuous aorta with aortic atherosclerosis. No acute fracture or destructive lesions. Multilevel thoracic osteophytosis. IMPRESSION: No acute cardiopulmonary abnormality. Electronically Signed   By: Rogelia Myers M.D.   On: 07/11/2024 17:25   CARDIAC CATHETERIZATION Result Date: 07/02/2024   Prox LAD to Mid LAD lesion is 90% stenosed.   Ost LM to Mid LM lesion is 50% stenosed.   Ost Cx to Prox Cx lesion is 90% stenosed.   Ost RCA to Prox RCA lesion is 100% stenosed.   Mid Graft to Dist Graft lesion is 95% stenosed.   Recommend uninterrupted dual antiplatelet therapy with Aspirin  81mg  daily and Clopidogrel  75mg  daily for a minimum of 6 months (stable ischemic heart disease-Class I recommendation). 1.  Severe native three-vessel CAD 2.  LIMA to LAD and SVG to OM widely patent 3.  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  Successful direct stenting of the distal SVG to PDA with distal protection with a 4.0 x 15 mm drug-eluting stent with intravascular ultrasound guidance 5.  Aspirin  and clopidogrel  for at least 6 months followed by clopidogrel  indefinitely 6.  Continue workup for aortic valve replacement    ECHO 04/2024: 1. Left ventricular ejection fraction, by estimation, is 60 to 65%. The left ventricle has normal function. The left ventricle has no regional wall motion abnormalities. The left ventricular internal cavity size was mildly dilated. Left ventricular diastolic parameters are consistent with Grade I diastolic dysfunction (impaired relaxation).   2. Right ventricular systolic function is normal. The right ventricular  size is normal.   3. The mitral valve is normal in structure. No evidence of mitral valve  regurgitation.   4. The aortic valve is normal in structure. Aortic valve regurgitation is  trivial. Severe aortic valve stenosis.   TELEMETRY reviewed by me 07/30/2024: sinus rhythm, rate  70s  EKG reviewed by me: sinus tachycardia ST depression, rate 129 bpm  Data reviewed by me 07/30/2024: last 24h vitals tele labs imaging I/O hospitalist progress note, PCCM notes  Principal Problem:   Acute respiratory distress Active Problems:   Hyperlipidemia   Cognitive impairment   Alcohol  abuse   Essential hypertension   CAD (  coronary artery disease)   Stroke (HCC)   Iron  deficiency anemia   Acute on chronic diastolic CHF (congestive heart failure) (HCC)   COPD exacerbation (HCC)   Elevated lactic acid level   Lobar pneumonia (HCC)   Metabolic acidosis, increased anion gap   Myocardial injury    ASSESSMENT AND PLAN:  Yug Loria is a 63 y.o. 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 Found to have pneumonia, overnight troponins uptrending. Cardiology was consulted for further evaluation.   # Ventricular Ectopy Per tele patient with bigeminy PVCs, started on IV amio on 07/30 for ventricular ectopy. PVC much improved s/p IV amio. Per tele this AM, 1 episode of bigeminy on tele on 08/31. -Continue to monitor telemetry closely.  -Monitor and replenish electrolytes for a goal K >4, Mag >2  -Continue Amio 400 mg BID for 10 days, then 200 mg daily.  -Increased metoprolol  to 25 mg daily.   # Acute hypoxic respiratory failure, mechanical ventilation # Aspiration pneumonia # Alcohol  withdrawal, improving # Acute on chronic HFpEF # Severe aortic stenosis Presented with SOB, cough, imaging concerning for HCAP/COPD exacerbation. LA elevated on presentation at 3.6 > 5.9> 3.4. s/p extubation 08/01. Increased O2 need to 10L, concern for aspiration event  on 08/05. NGT removed (08/07). BNP elevated 1500 > 3200 (08/07). Fluid status much improved.  -Resume home torsemide  10 mg daily. Closely monitor renal function, UOP. -Continue home dapagliflozin  10 mg daily.  -Further management of respiratory failure/ aspiration PNA as per primary team.  -CIWA protocol given hx of alcohol  abuse. -Originally scheduled to see Dr. Katina 7/30 to discuss aortic valve repair/replacement options, this has been rescheduled to 09/03 at [facility].  # Demand ischemia # Coronary artery disease s/p CABG x3 (04/2012) s/p recent stent (07/02/24) # Hypertension # Hyperlipidemia With recent LHC, s/p DES to SVG to PDA 07/02/2024. Reported some CP when he first came to hospital which has now resolved, no recurrence. Troponin trended 44 > 144 > 205 > 980 > 1862 > 5766 > 5756. Patient weaned off levo on 07/31, BP improving and stabilizing. Reports chest tightness with deep breathing, MSK tenderness on palpation, trops repeated (08/07), minimally elevated and flat 72 > 75 is demand ischemia, not ACS is setting of stated above hospital concerns. Repeat EKG (08/07) is without acute ischemic changes.   -Continue home plavix  75 mg daily. Continue aspirin  81 mg daily. Continue DAPT due to recent stent placement on 07/16. -Continue Crestor  20 mg daily for high-intensity statin management.  -Continue metoprolol  succinate as stated above. -Will plan to resume home losartan , Imdur  as BP allows.  -Recommend MAP > 65.   # Acute on chronic anemia # Hx AVM Patient received blood transfusion on 07/28, 07/31, 08/04. -Closely monitor H&H. -Continue PPIs. -Goal Hgb >8 due to cardiac history. Recommend transfusion when <8.  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 Continue DAPT due to recent stent placement.  -Management per primary team.   Pending SNF.  This patient's plan of care was discussed and created  with Dr. Ammon and he is in agreement.  Signed: Dorene Comfort, PA-C  07/30/2024, 8:42 AM [facility]

## 2024-07-31 DIAGNOSIS — J441 Chronic obstructive pulmonary disease with (acute) exacerbation: Secondary | ICD-10-CM | POA: Diagnosis not present

## 2024-07-31 DIAGNOSIS — R0603 Acute respiratory distress: Secondary | ICD-10-CM | POA: Diagnosis not present

## 2024-07-31 DIAGNOSIS — J189 Pneumonia, unspecified organism: Secondary | ICD-10-CM | POA: Diagnosis not present

## 2024-07-31 DIAGNOSIS — J181 Lobar pneumonia, unspecified organism: Secondary | ICD-10-CM | POA: Diagnosis not present

## 2024-07-31 LAB — BASIC METABOLIC PANEL WITH GFR
Anion gap: 9 (ref 5–15)
BUN: 40 mg/dL — ABNORMAL HIGH (ref 8–23)
CO2: 28 mmol/L (ref 22–32)
Calcium: 8.7 mg/dL — ABNORMAL LOW (ref 8.9–10.3)
Chloride: 101 mmol/L (ref 98–111)
Creatinine, Ser: 1.33 mg/dL — ABNORMAL HIGH (ref 0.61–1.24)
GFR, Estimated: 59 mL/min — ABNORMAL LOW (ref >60)
Glucose, Bld: 143 mg/dL — ABNORMAL HIGH (ref 70–99)
Potassium: 3.9 mmol/L (ref 3.5–5.1)
Sodium: 138 mmol/L (ref 135–145)

## 2024-07-31 MED ORDER — ISOSORBIDE MONONITRATE ER 30 MG PO TB24: 30.0000 mg | ORAL_TABLET | Freq: Every day | ORAL | Status: DC

## 2024-07-31 MED ORDER — TORSEMIDE 20 MG PO TABS: 20.0000 mg | ORAL_TABLET | Freq: Every day | ORAL | Status: DC

## 2024-07-31 MED ORDER — DM-GUAIFENESIN ER 30-600 MG PO TB12: 1.0000 | ORAL_TABLET | Freq: Two times a day (BID) | ORAL | Status: DC | PRN

## 2024-07-31 MED ORDER — VITAMIN B-1 100 MG PO TABS: 100.0000 mg | ORAL_TABLET | Freq: Every day | ORAL | Status: DC

## 2024-07-31 MED ORDER — ENSURE PLUS HIGH PROTEIN PO LIQD: 237.0000 mL | Freq: Three times a day (TID) | ORAL | Status: DC

## 2024-07-31 MED ORDER — SALINE SPRAY 0.65 % NA SOLN: 1.0000 | NASAL | Status: DC | PRN

## 2024-07-31 MED ORDER — IPRATROPIUM-ALBUTEROL 0.5-2.5 (3) MG/3ML IN SOLN: 3.0000 mL | Freq: Three times a day (TID) | RESPIRATORY_TRACT | Status: DC

## 2024-07-31 MED ORDER — AMIODARONE HCL 200 MG PO TABS: 200.0000 mg | ORAL_TABLET | Freq: Every day | ORAL | Status: DC

## 2024-07-31 MED ORDER — SENNOSIDES-DOCUSATE SODIUM 8.6-50 MG PO TABS: 2.0000 | ORAL_TABLET | Freq: Every day | ORAL | Status: DC

## 2024-07-31 MED ORDER — LACTULOSE 10 GM/15ML PO SOLN: 20.0000 g | Freq: Every day | ORAL | Status: DC | PRN

## 2024-07-31 MED ORDER — GLYCERIN (LAXATIVE) 2 G RE SUPP: 1.0000 | Freq: Every day | RECTAL | Status: DC | PRN

## 2024-07-31 MED ORDER — POTASSIUM CHLORIDE CRYS ER 20 MEQ PO TBCR
40.0000 meq | EXTENDED_RELEASE_TABLET | Freq: Once | ORAL | Status: AC
  Administered 2024-07-31: 40 meq via ORAL
  Filled 2024-07-31: qty 2

## 2024-07-31 MED ORDER — TORSEMIDE 20 MG PO TABS
20.0000 mg | ORAL_TABLET | Freq: Every day | ORAL | Status: DC
  Administered 2024-07-31: 20 mg via ORAL

## 2024-07-31 MED ORDER — BUDESONIDE 0.5 MG/2ML IN SUSP: 0.5000 mg | Freq: Two times a day (BID) | RESPIRATORY_TRACT | Status: DC

## 2024-07-31 MED ORDER — VITAMIN D (ERGOCALCIFEROL) 1.25 MG (50000 UNIT) PO CAPS: 50000.0000 [IU] | ORAL_CAPSULE | ORAL | Status: DC

## 2024-07-31 MED ORDER — NICOTINE 7 MG/24HR TD PT24: 7.0000 mg | MEDICATED_PATCH | Freq: Every day | TRANSDERMAL | Status: DC

## 2024-07-31 MED ORDER — ROSUVASTATIN CALCIUM 20 MG PO TABS: 20.0000 mg | ORAL_TABLET | Freq: Every day | ORAL | Status: DC

## 2024-07-31 MED ORDER — MELATONIN 5 MG PO TABS: 5.0000 mg | ORAL_TABLET | Freq: Every evening | ORAL | Status: DC | PRN

## 2024-07-31 MED ORDER — ADULT MULTIVITAMIN W/MINERALS CH: 1.0000 | ORAL_TABLET | Freq: Every day | ORAL | Status: DC

## 2024-07-31 NOTE — Progress Notes (Signed)
 Approximately 1330--Pt discharged to [facility] per orders. AVS Discharge instructions called and provided to RN at [facility] with all questions and concerns taken at this time.All PIVs removed, sites WDL. Telemetry disconnected and notified. All pt belongings taken with pt.

## 2024-07-31 NOTE — TOC Progression Note (Addendum)
 Transition of Care [facility]) - Progression Note    Patient Details  Name: [name] MRN: [id number] Date of Birth: December 27, 1960  Transition of Care [facility]) CM/SW Contact  Delphine KANDICE Bring, RN Phone Number: 07/31/2024, 11:39 AM  Clinical Narrative:    9094 CM checked [employer]. Insurance auth approved. Cm called Barnie at [facility] but had to leave a message. CM notified Dr. Caleen that insurance auth approve and waiting for call back from facility to see if patient can come today.   She states patient is medically ready for d/c.   1040 Deborah called CM back. She states that patient can come today. He will be going to room 305. The nurse can call report to [phone]. CM spoke with patient about being d/c to [facility]. Patient was in agreement. His nurse was in the room at this time. CM sent secure chat to MD letting her know that facility can accept patient today. Waiting on d/c summary.   1200  Called [facility].  CM spoke with Alm. He states that patient is 5th in line with transportation. CM notified nurse and MD about transportation. CM completed medical necessity form.                      Expected Discharge Plan and Services         Expected Discharge Date: 07/31/24                                     Social Drivers of Health (SDOH) Interventions SDOH Screenings   Food Insecurity: No Food Insecurity (07/12/2024)  Housing: Low Risk  (07/12/2024)  Transportation Needs: No Transportation Needs (07/12/2024)  Utilities: Not At Risk (07/12/2024)  Social Connections: Socially Isolated (07/12/2024)  Tobacco Use: High Risk (07/11/2024)    Readmission Risk Interventions    05/14/2024    8:44 AM 10/27/2022   11:28 AM  Readmission Risk Prevention Plan  Transportation Screening  Complete  PCP or Specialist Appt within 5-7 Days  Complete  PCP or Specialist Appt within 3-5 Days Complete   Home Care Screening  Complete  Medication Review (RN CM)  Complete  HRI or Home Care Consult  Complete   Social Work Consult for Recovery Care Planning/Counseling Complete   Palliative Care Screening Not Applicable   Medication Review Oceanographer) Complete

## 2024-07-31 NOTE — Progress Notes (Signed)
   07/31/24 0930  Spiritual Encounters  Type of Visit Initial  Care provided to: Patient  Conversation partners present during encounter Nurse  Reason for visit Advance directives  OnCall Visit Yes   Chaplain visited patient in response to a [place] Consult for an AD.  Patient was aware of the document and already had it completed.  Chaplain restated what the document is and confirmed what the patient noted on the document.  Patient expressed approval to move forward with the notary.  Chaplain secured the notary Forensic scientist from [employer]) and two witnesses.  Chaplain completed the AD process and provided the patient with the original and a copy.  Chaplain scanned in the document to the applicable department and put a copy in his patient binder.  Patient expressed concern about an upcoming procedure for another stent.  Chaplain and Nurse spoke with patient and Nurse shared she'd enter a consult to the Administrator to speak to patient about his concerns.  He shared that when he gets in front of staff, he gets nervous to share his concerns.    Rev. Rana M. Nicholaus, M.Div. Chaplain Resident [facility]

## 2024-07-31 NOTE — Discharge Summary (Signed)
 Physician Discharge Summary   Patient: [name] MRN: [id number] DOB: 1961-08-16  Admit date:     07/11/2024  Discharge date: 07/31/24  Discharge Physician: Amaryllis Dare   PCP: Osa Geralds, NP   Recommendations at discharge:  Please obtain CBC and BMP on follow-up Patient uses 2 L of oxygen as needed Follow-up with cardiology Follow-up with primary care provider  Discharge Diagnoses: Principal Problem:   Acute respiratory distress Active Problems:   COPD exacerbation (HCC)   Lobar pneumonia (HCC)   Elevated lactic acid level   Metabolic acidosis, increased anion gap   Essential hypertension   Acute on chronic diastolic CHF (congestive heart failure) (HCC)   Hyperlipidemia   Stroke (HCC)   CAD (coronary artery disease)   Iron  deficiency anemia   Alcohol  abuse   Cognitive impairment   Myocardial injury   Hospital Course: 63 y.o. male with medical history significant of  GIB, alcohol  abuse, COPD on 2 L oxygen, hypertension, CAD with prior CABG and recent Stent placement, HLD, stroke, dementia, dCHF, presents with SOB, cough, chest pain.   Patient was recently hospitalized from 7/16 - 7/17 for elective LHC. Pt was s/p of successful direct stenting of distal SVG to PDA. Pt states that he has SOB and productive cough with yellow-colored sputum production in the past several days, which has been progressively worsening.  He also complains of chest pain, which is located in the right side of chest.  CTA negative for PE, showed patchy infiltration in the right upper lobe.  Patient is admitted to telemetry bed as inpatient.   He was admitted to the hospitalist service for COPD exacerbation and acute hypoxic respiratory failure.  He developed worsening respiratory distress shortly after admission.  He was placed on BiPAP and was transferred to the ICU for further management.  He has a became agitated and this was attributed to acute alcohol  withdrawal syndrome.  He was started on  IV Precedex  infusion for this. He developed acute toxic metabolic encephalopathy and worsening respiratory failure likely from aspiration pneumonia.  He required intubation and mechanical ventilation in the ICU.  He became hypotensive with induction meds despite IV fluids and IV Levophed  infusion.  There was brief loss of pulse but ROSC was achieved after 2 minutes of CPR and 1 dose of epinephrine .  His hospitalization was complicated with recurrent aspiration, required course of antibiotics twice.  He also developed acute on chronic HFpEF and pulmonary edema while in the hospital requiring IV diuresis, his oxygen requirement slowly improved and now he was saturating well on room air.  Continue to require 2 L of oxygen intermittently which is his baseline.  Patient is being discharged on torsemide .  There was some concern of dysphagia, speech evaluated him.  He was slowly started back on diet and currently on dysphagia 2 diet with thin liquid, he need to be encouraged to stay upright to decrease the risk of aspiration while eating.  Patient has an history of recent NSTEMI, troponin peaked at 5766, recent elective cardiac cath s/p DES to SVG to PDA on 07/02/2024, also has an history of CABG x 3 in May 2013.  He will continue with aspirin , Plavix , statin and Imdur .  Patient has an history of GI bleed and chronic anemia.  He was evaluated by gastroenterologist, he has a normal colonoscopy on 05/08/2024 and a normal EGD on 06/01/2023. 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  He did receive 4 unit of PRBC during current hospitalization, goal  hemoglobin to stay above 8 due to CAD.  Patient did had significant electrolyte abnormalities during current hospitalization which was repleted before discharge.  His AKI resolved and creatinine is now at baseline.  Blood glucose remained stable with no history of diabetes.  Patient did had significant alcohol  withdrawal requiring Precedex .  He will  need continuation of counseling for alcohol  cessation.  His toxic metabolic encephalopathy has been improved and mentation is now at baseline.  Patient with significant underlying comorbidities and will remain high risk for readmission and mortality.  He will continue on current medications and need to have a close follow-up with his providers for further assistance.       Consultants: PCCM.  Cardiology.  Gastroenterology Procedures performed: Intubated on 7/30 followed by extubation on 07/18/2024.  Insertion of central line which was later on removed Disposition: Skilled nursing facility Diet recommendation: Dysphagia 2 diet with thin liquid Discharge Diet Orders (From admission, onward)     Start     Ordered   07/31/24 0000  Diet - low sodium heart healthy        07/31/24 1129           Cardiac diet DISCHARGE MEDICATION: Allergies as of 07/31/2024       Reactions   Wellbutrin  [bupropion ]    Paranoia         Medication List     STOP taking these medications    pravastatin  20 MG tablet Commonly known as: PRAVACHOL    spironolactone  25 MG tablet Commonly known as: ALDACTONE        TAKE these medications    albuterol  108 (90 Base) MCG/ACT inhaler Commonly known as: VENTOLIN  HFA Inhale 2 puffs into the lungs every 6 (six) hours as needed for wheezing or shortness of breath.   amiodarone  200 MG tablet Commonly known as: PACERONE  Take 1 tablet (200 mg total) by mouth daily. Start taking on: August 01, 2024   ascorbic acid  500 MG tablet Commonly known as: VITAMIN C  Take 1 tablet (500 mg total) by mouth daily.   aspirin  EC 81 MG tablet Take 1 tablet (81 mg total) by mouth daily. Swallow whole.   budesonide  0.5 MG/2ML nebulizer solution Commonly known as: PULMICORT  Take 2 mLs (0.5 mg total) by nebulization 2 (two) times daily.   clopidogrel  75 MG tablet Commonly known as: PLAVIX  Take 1 tablet (75 mg total) by mouth daily with breakfast.   dapagliflozin   propanediol 10 MG Tabs tablet Commonly known as: FARXIGA  Take 1 tablet (10 mg total) by mouth daily.   dextromethorphan -guaiFENesin  30-600 MG 12hr tablet Commonly known as: MUCINEX  DM Take 1 tablet by mouth 2 (two) times daily as needed for cough.   feeding supplement Liqd Take 237 mLs by mouth 3 (three) times daily between meals.   Ferrex 150 150 MG capsule Generic drug: iron  polysaccharides Take 1 capsule (150 mg total) by mouth daily.   folic acid  1 MG tablet Commonly known as: FOLVITE  Take 1 tablet (1 mg total) by mouth daily.   Glycerin  (Adult) 2 g Supp Place 1 suppository rectally daily as needed for moderate constipation.   ipratropium-albuterol  0.5-2.5 (3) MG/3ML Soln Commonly known as: DUONEB Take 3 mLs by nebulization 3 (three) times daily.   isosorbide  mononitrate 30 MG 24 hr tablet Commonly known as: IMDUR  Take 1 tablet (30 mg total) by mouth daily. What changed:  medication strength how much to take   lactulose  10 GM/15ML solution Commonly known as: CHRONULAC  Take 30 mLs (20 g total)  by mouth daily as needed for moderate constipation or mild constipation.   losartan  25 MG tablet Commonly known as: COZAAR  Take 0.5 tablets (12.5 mg total) by mouth daily.   melatonin 5 MG Tabs Place 1 tablet (5 mg total) into feeding tube at bedtime as needed.   metoprolol  succinate 25 MG 24 hr tablet Commonly known as: TOPROL -XL Take 0.5 tablets (12.5 mg total) by mouth daily.   multivitamin with minerals Tabs tablet Take 1 tablet by mouth daily. Start taking on: August 01, 2024   nicotine  7 mg/24hr patch Commonly known as: NICODERM CQ  - dosed in mg/24 hr Place 1 patch (7 mg total) onto the skin daily. Start taking on: August 01, 2024   pantoprazole  40 MG tablet Commonly known as: PROTONIX  Take 1 tablet (40 mg total) by mouth daily.   rosuvastatin  20 MG tablet Commonly known as: CRESTOR  Take 1 tablet (20 mg total) by mouth daily. Start taking on: August 01, 2024   senna-docusate 8.6-50 MG tablet Commonly known as: Senokot-S Take 2 tablets by mouth at bedtime.   sodium chloride  0.65 % Soln nasal spray Commonly known as: OCEAN Place 1 spray into both nostrils as needed for congestion.   thiamine  100 MG tablet Commonly known as: Vitamin B-1 Take 1 tablet (100 mg total) by mouth daily. Start taking on: August 01, 2024   torsemide  20 MG tablet Commonly known as: DEMADEX  Take 1 tablet (20 mg total) by mouth daily. Start taking on: August 01, 2024 What changed:  medication strength how much to take   Vitamin D  (Ergocalciferol ) 1.25 MG (50000 UNIT) Caps capsule Commonly known as: DRISDOL  Take 1 capsule (50,000 Units total) by mouth every 7 (seven) days. Start taking on: August 02, 2024        Contact information for follow-up providers     Katina Albright, MD Follow up on 08/20/2024.   Specialty: Cardiology Why: Rescheduled for 09/03 at [facility] at cardiologist office Contact information: [address] [place] [address] [phone]         Launie Maiden, Ronal Maxwell, NP. Go in 1 week(s).   Specialty: Nurse Practitioner Contact information: [address] [place] [address] [phone]              Contact information for after-discharge care     Destination     [facility] .   Service: Skilled Nursing Contact information: [address] [place]  [phone]                    Discharge Exam: Fredricka Weights   07/29/24 0500 07/30/24 0500 07/31/24 0500  Weight: 97.1 kg 97.4 kg 97.9 kg   General.  Frail gentleman, in no acute distress. Pulmonary.  Lungs clear bilaterally, normal respiratory effort. CV.  Regular rate and rhythm, no JVD, rub or murmur. Abdomen.  Soft, nontender, nondistended, BS positive. CNS.  Alert and oriented .  No focal neurologic deficit. Extremities.  No edema, no cyanosis, pulses intact and symmetrical. Psychiatry.   Judgment and insight appears normal.   Condition at discharge: stable  The results of significant diagnostics from this hospitalization (including imaging, microbiology, ancillary and laboratory) are listed below for reference.   Imaging Studies: DG Chest Port 1 View Result Date: 07/29/2024 CLINICAL DATA:  Congestive heart failure. EXAM: PORTABLE CHEST 1 VIEW COMPARISON:  07/24/2024 FINDINGS: The enteric tube has been removed. Post median sternotomy with stable cardiomegaly. Bilateral pleural effusions and bibasilar opacities,  slightly worsened in the interim. Improved interstitial opacities with mild residual. Underlying emphysematous change. No pneumothorax. IMPRESSION: 1. Bilateral pleural effusions and bibasilar opacities, slightly worsened in the interim. 2. Improved interstitial opacities with mild residual. Electronically Signed   By: Andrea Gasman M.D.   On: 07/29/2024 09:53   DG Chest 1 View Result Date: 07/24/2024 CLINICAL DATA:  [id number] Dyspnea [id number] EXAM: CHEST  1 VIEW COMPARISON:  July 23, 2024, July 11, 2024 FINDINGS: Esophagogastric tube terminates below the diaphragm, outside the field of view. Diffuse interstitial opacities throughout both lungs. No lobar consolidation or pneumothorax. Lateral right upper lobe nodular opacity measures 1.9 cm, corresponding to the lung nodule on the prior chest CT. Moderate cardiomegaly. Sternotomy wires and CABG markers. No acute fracture or destructive lesions. Multilevel thoracic osteophytosis. IMPRESSION: 1. Moderate cardiomegaly. Unchanged interstitial opacities throughout both lungs, possibly persistent interstitial edema and underlying emphysematous changes. 2. Similarly appearing lateral right upper lung zone nodular opacity. Please see the comparison chest CT for further characterization. Electronically Signed   By: Rogelia Myers M.D.   On: 07/24/2024 08:43   DG Chest Port 1 View Result Date: 07/23/2024 CLINICAL DATA:  Shortness of breath.  EXAM: PORTABLE CHEST 1 VIEW COMPARISON:  July 22, 2024. FINDINGS: The heart size and mediastinal contours are within normal limits. Status post coronary bypass graft. Mild diffuse interstitial densities are noted concerning for possible pulmonary edema with possible small pleural effusions. Nasogastric tube is seen entering stomach. The visualized skeletal structures are unremarkable. IMPRESSION: Mild diffuse interstitial densities are noted concerning for possible pulmonary edema with small pleural effusions. Electronically Signed   By: Lynwood Landy Raddle M.D.   On: 07/23/2024 13:03   DG ABD ACUTE 2+V W 1V CHEST Result Date: 07/22/2024 CLINICAL DATA:  [id number] Dyspnea [id number] Encounter for imaging study to confirm nasogastric (NG) tube placement [id number] EXAM: DG ABDOMEN ACUTE WITH 1 VIEW CHEST COMPARISON:  Chest x-ray 07/19/2024 FINDINGS: Enteric tube courses below the diaphragm with tip and side port overlying the expected region of the gastric lumen. The heart and mediastinal contours are within normal limits. No focal consolidation. Chronic coarsened interstitial markings with no overt pulmonary edema. Bilateral trace pleural effusion. No pneumothorax. Right upper quadrant surgical clips. There is no evidence of dilated bowel loops or free intraperitoneal air. No radiopaque calculi or other significant radiographic abnormality is seen. No acute osseous abnormality.  Intact sternotomy wires. IMPRESSION: 1. Enteric tube in good position. 2. Bowel trace pleural effusions. 3. Nonobstructive bowel gas pattern. Electronically Signed   By: Morgane  Naveau M.D.   On: 07/22/2024 14:18   DG Chest Port 1 View Result Date: 07/19/2024 CLINICAL DATA:  Dyspnea EXAM: PORTABLE CHEST 1 VIEW COMPARISON:  07/18/2024 FINDINGS: Two frontal views of the chest demonstrate an enteric catheter passing below diaphragm, tip excluded by collimation. Left internal jugular catheter tip overlies superior vena cava unchanged. Postsurgical  changes from prior CABG. The cardiac silhouette is stable. Mild central vascular congestion, with a trace right pleural effusion unchanged since prior exam. The nodular density projecting over the right upper lateral chest on prior study is less well seen on this exam. This may be due to positioning. No pneumothorax. No acute bony abnormalities. IMPRESSION: 1. Pulmonary vascular congestion and small right pleural effusion, unchanged since prior study. 2. The nodular consolidation within the right upper lateral chest seen on prior x-ray and CT is not well seen on this exam, likely due to patient positioning. Electronically Signed   By: Ozell  Delores M.D.   On: 07/19/2024 17:34   DG Chest Port 1 View Result Date: 07/18/2024 EXAM: 1 VIEW XRAY OF THE CHEST 07/18/2024 04:59:06 AM COMPARISON: 07/16/2024 CLINICAL HISTORY: [id number] Acute respiratory failure with hypoxia (HCC) 427266. Acute respiratory failure with hypoxia FINDINGS: LUNGS AND PLEURA: Nodular opacity within the periphery of the right upper lobe appears similar to CT from 07/11/2024. Please refer to that report for follow-up recommendations. Mild asymmetric hazy opacification over the right mid and right lower lung may be related to positional artifact versus small posterior layering effusion. Mild subsegmental atelectasis in the left base. HEART AND MEDIASTINUM: Stable cardiomediastinal contours. Previous median sternotomy and CABG procedure. BONES AND SOFT TISSUES: Aortic atherosclerotic calcification. Left IJ catheter is identified with the tip in the projection of the SVC. There is an enteric tube which courses below the field of view. ET tube tip is stable above the carina. IMPRESSION: 1. Stable nodular opacity within the periphery of the right upper lobe, similar to CT from 07/11/2024. Please refer to that report for follow-up recommendations. 2. Mild asymmetric hazy opacification over the right mid and right lower lung, possibly related to rotational  artifact versus small posterior layering effusion. 3. Mild subsegmental atelectasis in the left base. Electronically signed by: Waddell Calk MD 07/18/2024 06:51 AM EDT RP Workstation: [id number]   DG Chest Port 1 View Result Date: 07/16/2024 CLINICAL DATA:  Endotracheal tube and central line placement. EXAM: PORTABLE CHEST 1 VIEW COMPARISON:  07/16/2024 at 8:58 a.m. FINDINGS: Patient is rotated to the right. Endotracheal tube has tip proximally 13.5 cm above the carina. This could be advanced approximately 6 cm. Left IJ central venous catheter has tip over the SVC. Enteric tube courses into the region of the stomach and off the image as tip is not visualized. Lungs are adequately inflated with possible mild hazy opacification over the right upper lung abutting the minor fissure which may be due to atelectasis or infection. Left lung is clear. Cardiomediastinal silhouette and remainder of the exam is unchanged. IMPRESSION: 1. Endotracheal tube has tip proximally 13.5 cm above the carina. This could be advanced approximately 6 cm. 2. Left IJ central venous catheter with tip over the SVC. 3. Possible mild hazy opacification over the right upper lung abutting the minor fissure which may be due to atelectasis or infection. Electronically Signed   By: Toribio Agreste M.D.   On: 07/16/2024 10:53   DG Chest Port 1 View Result Date: 07/16/2024 CLINICAL DATA:  [id number] Aspiration into airway [id number]. EXAM: PORTABLE CHEST 1 VIEW COMPARISON:  06/23/2024. FINDINGS: Bilateral lungs appear hyperlucent with coarse bronchovascular markings, in keeping with COPD. Bilateral lungs otherwise appear clear. No dense consolidation or lung collapse. Bilateral costophrenic angles are clear. Stable cardio-mediastinal silhouette. There are surgical staples along the heart border and sternotomy wires, status post CABG (coronary artery bypass graft). No acute osseous abnormalities. The soft tissues are within normal limits. Enteric tube is  seen coursing below the left hemidiaphragm however, the tip is not included on the film. IMPRESSION: No active disease. COPD. Electronically Signed   By: Ree Molt M.D.   On: 07/16/2024 09:09   DG Abd 1 View Result Date: 07/16/2024 CLINICAL DATA:  Vomiting.  NG tube placement. EXAM: ABDOMEN - 1 VIEW COMPARISON:  07/15/2024 FINDINGS: Nasogastric tube has tip and side-port over the stomach in the upper abdomen just left of midline. Visualized lower thorax unchanged. Abdominopelvic images demonstrate a non obstructive bowel gas pattern. Mild fecal retention throughout the colon.  Surgical clips over the right upper quadrant. There are degenerative changes of the spine and hips. Calcified plaque over the abdominal aorta and iliac arteries. IMPRESSION: 1. Nasogastric tube with tip and side-port over the stomach. 2. Nonobstructive bowel gas pattern. Mild fecal retention. 3. Aortic atherosclerosis. Electronically Signed   By: Toribio Agreste M.D.   On: 07/16/2024 07:51   DG Abd 1 View Result Date: 07/15/2024 CLINICAL DATA:  Orogastric tube placement. EXAM: ABDOMEN - 1 VIEW COMPARISON:  June 29, 2023. FINDINGS: Distal tip of nasogastric tube is seen in expected position of proximal stomach. IMPRESSION: Nasogastric tube tip seen in expected position of proximal stomach. Electronically Signed   By: Lynwood Landy Raddle M.D.   On: 07/15/2024 10:25   DG Chest Port 1 View Result Date: 07/13/2024 CLINICAL DATA:  Respiratory failure with hypoxia. EXAM: PORTABLE CHEST 1 VIEW COMPARISON:  Multiple previous chest x-rays and recent chest CT. FINDINGS: Stable surgical changes from bypass surgery. The cardiac silhouette, mediastinal and hilar contours are within normal limits and stable. Underlying emphysematous changes and pulmonary scarring. No pleural effusions or discrete pulmonary infiltrates. No pneumothorax. IMPRESSION: Emphysematous changes and pulmonary scarring but no acute overlying pulmonary process. Electronically  Signed   By: MYRTIS Stammer M.D.   On: 07/13/2024 16:08   US  Abdomen Limited RUQ (LIVER/GB) Result Date: 07/13/2024 CLINICAL DATA:  Portal hypertension. EXAM: ULTRASOUND ABDOMEN LIMITED RIGHT UPPER QUADRANT COMPARISON:  Abdominal ultrasound 10/19/2022 FINDINGS: Gallbladder: Surgically absent. Common bile duct: Diameter: 3.3 mm Liver: No focal lesion identified. Parenchymal echogenicity is increased and diffusely heterogeneous. There is lobular liver contour. Portal vein is patent on color Doppler imaging with normal direction of blood flow towards the liver. Other: None. IMPRESSION: Lobular liver contour with increased echogenicity and heterogeneous parenchyma consistent with cirrhosis. No focal liver lesion identified. Electronically Signed   By: Greig Pique M.D.   On: 07/13/2024 16:03   DG Chest Port 1 View Result Date: 07/12/2024 CLINICAL DATA:  Shortness of breath. EXAM: PORTABLE CHEST 1 VIEW COMPARISON:  07/11/2024 FINDINGS: The cardio pericardial silhouette is enlarged. There is pulmonary vascular congestion without overt pulmonary edema. Trace bilateral pleural effusions. Old posterior left sixth rib fracture. Telemetry leads overlie the chest. IMPRESSION: Enlargement of the cardiopericardial silhouette with new pulmonary vascular congestion and trace bilateral pleural effusions. Electronically Signed   By: Camellia Candle M.D.   On: 07/12/2024 12:11   CT Angio Chest PE W/Cm &/Or Wo Cm Result Date: 07/11/2024 CLINICAL DATA:  High probability for PE. Chest pain with shortness of breath EXAM: CT ANGIOGRAPHY CHEST WITH CONTRAST TECHNIQUE: Multidetector CT imaging of the chest was performed using the standard protocol during bolus administration of intravenous contrast. Multiplanar CT image reconstructions and MIPs were obtained to evaluate the vascular anatomy. RADIATION DOSE REDUCTION: This exam was performed according to the departmental dose-optimization program which includes automated exposure  control, adjustment of the mA and/or kV according to patient size and/or use of iterative reconstruction technique. CONTRAST:  75mL OMNIPAQUE  IOHEXOL  350 MG/ML SOLN COMPARISON:  CT angiogram chest 05/09/2024 FINDINGS: Cardiovascular: Satisfactory opacification of the pulmonary arteries to the segmental level. No evidence of pulmonary embolism. Patient is status post cardiac surgery. The heart is enlarged. There is no pericardial effusion. There are atherosclerotic calcifications of the aorta and coronary arteries. Aorta is normal in size. Mediastinum/Nodes: There are enlarged right hilar lymph nodes measuring up to 11 mm. Enlarged paratracheal lymph nodes measure up to 10 mm. Visualized thyroid gland and esophagus are within normal  limits. Lungs/Pleura: Moderate emphysema again seen. There is stable scarring in the right lung apex. There is new patchy slightly nodular airspace disease in the right upper lobe measuring 2.0 by 1.1 cm. There is right-sided central peribronchial wall thickening. Previously identified airspace disease in the left lower lobe has resolved. There is no pleural effusion or pneumothorax. There is a stable left lower lobe pulmonary nodule image 6/57. Upper Abdomen: No acute abnormality. Musculoskeletal: No chest wall abnormality. No acute or significant osseous findings. Review of the MIP images confirms the above findings. IMPRESSION: 1. No evidence for pulmonary embolism. 2. New patchy slightly nodular airspace disease in the right upper lobe worrisome for pneumonia. Follow-up CT recommended in 4-6 weeks recommended to ensure resolution and to exclude underlying nodule. 3. Right-sided central peribronchial wall thickening worrisome for bronchitis. 4. Right hilar and mediastinal lymphadenopathy, likely reactive. 5. Stable left lower lobe pulmonary nodule. 6. Cardiomegaly. Aortic Atherosclerosis (ICD10-I70.0) and Emphysema (ICD10-J43.9). Electronically Signed   By: Greig Pique M.D.   On:  07/11/2024 18:54   DG Chest Portable 1 View Result Date: 07/11/2024 CLINICAL DATA:  SOB EXAM: PORTABLE CHEST - 1 VIEW COMPARISON:  June 24, 2024 FINDINGS: Biapical pleural thickening. Sternotomy wires and CABG markers. No focal airspace consolidation, pleural effusion, or pneumothorax. Mild cardiomegaly. Tortuous aorta with aortic atherosclerosis. No acute fracture or destructive lesions. Multilevel thoracic osteophytosis. IMPRESSION: No acute cardiopulmonary abnormality. Electronically Signed   By: Rogelia Myers M.D.   On: 07/11/2024 17:25   CARDIAC CATHETERIZATION Result Date: 07/02/2024   Prox LAD to Mid LAD lesion is 90% stenosed.   Ost LM to Mid LM lesion is 50% stenosed.   Ost Cx to Prox Cx lesion is 90% stenosed.   Ost RCA to Prox RCA lesion is 100% stenosed.   Mid Graft to Dist Graft lesion is 95% stenosed.   Recommend uninterrupted dual antiplatelet therapy with Aspirin  81mg  daily and Clopidogrel  75mg  daily for a minimum of 6 months (stable ischemic heart disease-Class I recommendation). 1.  Severe native three-vessel CAD 2.  LIMA to LAD and SVG to OM widely patent 3.  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  Successful direct stenting of the distal SVG to PDA with distal protection with a 4.0 x 15 mm drug-eluting stent with intravascular ultrasound guidance 5.  Aspirin  and clopidogrel  for at least 6 months followed by clopidogrel  indefinitely 6.  Continue workup for aortic valve replacement    Microbiology: Results for orders placed or performed during the hospital encounter of 07/11/24  Culture, blood (routine x 2)     Status: Abnormal   Collection Time: 07/11/24  7:51 PM   Specimen: BLOOD  Result Value Ref Range Status   Specimen Description   Final    BLOOD LEFT ANTECUBITAL Performed at [facility], [address] [place], [place] [address]    Special Requests   Final    BOTTLES DRAWN AEROBIC AND ANAEROBIC Blood Culture adequate volume Performed at  [facility], [address], [place], [place] [address]    Culture  Setup Time   Final    GRAM POSITIVE COCCI AEROBIC BOTTLE ONLY Organism ID to follow CRITICAL RESULT CALLED TO, READ BACK BY AND VERIFIED WITH: NATHAN BELUE, PHARMACY 07/13/24 0003 JL Performed at [facility], [address] [place], [place] [address]    Culture (A)  Final    STAPHYLOCOCCUS EPIDERMIDIS THE SIGNIFICANCE OF ISOLATING THIS ORGANISM FROM A SINGLE SET OF BLOOD  CULTURES WHEN MULTIPLE SETS ARE DRAWN IS UNCERTAIN. PLEASE NOTIFY THE MICROBIOLOGY DEPARTMENT WITHIN ONE WEEK IF SPECIATION AND SENSITIVITIES ARE REQUIRED. Performed at [facility], [address] [place], [place] [address]    Report Status 07/14/2024 FINAL  Final  Culture, blood (routine x 2)     Status: None   Collection Time: 07/11/24  7:51 PM   Specimen: BLOOD  Result Value Ref Range Status   Specimen Description BLOOD RIGHT ANTECUBITAL  Final   Special Requests   Final    BOTTLES DRAWN AEROBIC AND ANAEROBIC Blood Culture results may not be optimal due to an inadequate volume of blood received in culture bottles   Culture   Final    NO GROWTH 5 DAYS Performed at [facility], [address] [place], [place] [address]    Report Status 07/16/2024 FINAL  Final  Blood Culture ID Panel (Reflexed)     Status: Abnormal   Collection Time: 07/11/24  7:51 PM  Result Value Ref Range Status   Enterococcus faecalis NOT DETECTED NOT DETECTED Final   Enterococcus Faecium NOT DETECTED NOT DETECTED Final   Listeria monocytogenes NOT DETECTED NOT DETECTED Final   Staphylococcus species DETECTED (A) NOT DETECTED Final    Comment: CRITICAL RESULT CALLED TO, READ BACK BY AND VERIFIED WITH: NATHAN BELUE, PHARMACY 07/13/24 0003 JL    Staphylococcus aureus (BCID) NOT DETECTED NOT DETECTED Final   Staphylococcus epidermidis DETECTED (A) NOT DETECTED Final    Comment: Methicillin (oxacillin) resistant coagulase negative  staphylococcus. Possible blood culture contaminant (unless isolated from more than one blood culture draw or clinical case suggests pathogenicity). No antibiotic treatment is indicated for blood  culture contaminants. CRITICAL RESULT CALLED TO, READ BACK BY AND VERIFIED WITH: NATHAN BELUE, PHARMACY 07/13/24 0003 JL    Staphylococcus lugdunensis NOT DETECTED NOT DETECTED Final   Streptococcus species NOT DETECTED NOT DETECTED Final   Streptococcus agalactiae NOT DETECTED NOT DETECTED Final   Streptococcus pneumoniae NOT DETECTED NOT DETECTED Final   Streptococcus pyogenes NOT DETECTED NOT DETECTED Final   A.calcoaceticus-baumannii NOT DETECTED NOT DETECTED Final   Bacteroides fragilis NOT DETECTED NOT DETECTED Final   Enterobacterales NOT DETECTED NOT DETECTED Final   Enterobacter cloacae complex NOT DETECTED NOT DETECTED Final   Escherichia coli NOT DETECTED NOT DETECTED Final   Klebsiella aerogenes NOT DETECTED NOT DETECTED Final   Klebsiella oxytoca NOT DETECTED NOT DETECTED Final   Klebsiella pneumoniae NOT DETECTED NOT DETECTED Final   Proteus species NOT DETECTED NOT DETECTED Final   Salmonella species NOT DETECTED NOT DETECTED Final   Serratia marcescens NOT DETECTED NOT DETECTED Final   Haemophilus influenzae NOT DETECTED NOT DETECTED Final   Neisseria meningitidis NOT DETECTED NOT DETECTED Final   Pseudomonas aeruginosa NOT DETECTED NOT DETECTED Final   Stenotrophomonas maltophilia NOT DETECTED NOT DETECTED Final   Candida albicans NOT DETECTED NOT DETECTED Final   Candida auris NOT DETECTED NOT DETECTED Final   Candida glabrata NOT DETECTED NOT DETECTED Final   Candida krusei NOT DETECTED NOT DETECTED Final   Candida parapsilosis NOT DETECTED NOT DETECTED Final   Candida tropicalis NOT DETECTED NOT DETECTED Final   Cryptococcus neoformans/gattii NOT DETECTED NOT DETECTED Final   Methicillin resistance mecA/C DETECTED (A) NOT DETECTED Final    Comment: CRITICAL RESULT CALLED  TO, READ BACK BY AND VERIFIED WITH: RANKIN DILLS, PHARMACY 07/13/24 0003 JL Performed at [facility], [address] [place], [place] [address]   Resp panel by RT-PCR (RSV,  Flu A&B, Covid) Anterior Nasal Swab     Status: None   Collection Time: 07/11/24 10:35 PM   Specimen: Anterior Nasal Swab  Result Value Ref Range Status   SARS Coronavirus 2 by RT PCR NEGATIVE NEGATIVE Final    Comment: (NOTE) SARS-CoV-2 target nucleic acids are NOT DETECTED.  The SARS-CoV-2 RNA is generally detectable in upper respiratory specimens during the acute phase of infection. The lowest concentration of SARS-CoV-2 viral copies this assay can detect is 138 copies/mL. A negative result does not preclude SARS-Cov-2 infection and should not be used as the sole basis for treatment or other patient management decisions. A negative result may occur with  improper specimen collection/handling, submission of specimen other than nasopharyngeal swab, presence of viral mutation(s) within the areas targeted by this assay, and inadequate number of viral copies(<138 copies/mL). A negative result must be combined with clinical observations, patient history, and epidemiological information. The expected result is Negative.  Fact Sheet for Patients:  BloggerCourse.com  Fact Sheet for Healthcare Providers:  SeriousBroker.it  This test is no t yet approved or cleared by the [place]  FDA and  has been authorized for detection and/or diagnosis of SARS-CoV-2 by FDA under an Emergency Use Authorization (EUA). This EUA will remain  in effect (meaning this test can be used) for the duration of the COVID-19 declaration under Section 564(b)(1) of the Act, 21 U.S.C.section 360bbb-3(b)(1), unless the authorization is terminated  or revoked sooner.       Influenza A by PCR NEGATIVE NEGATIVE Final   Influenza B by PCR NEGATIVE NEGATIVE Final    Comment:  (NOTE) The Xpert Xpress SARS-CoV-2/FLU/RSV plus assay is intended as an aid in the diagnosis of influenza from Nasopharyngeal swab specimens and should not be used as a sole basis for treatment. Nasal washings and aspirates are unacceptable for Xpert Xpress SARS-CoV-2/FLU/RSV testing.  Fact Sheet for Patients: BloggerCourse.com  Fact Sheet for Healthcare Providers: SeriousBroker.it  This test is not yet approved or cleared by the [place]  FDA and has been authorized for detection and/or diagnosis of SARS-CoV-2 by FDA under an Emergency Use Authorization (EUA). This EUA will remain in effect (meaning this test can be used) for the duration of the COVID-19 declaration under Section 564(b)(1) of the Act, 21 U.S.C. section 360bbb-3(b)(1), unless the authorization is terminated or revoked.     Resp Syncytial Virus by PCR NEGATIVE NEGATIVE Final    Comment: (NOTE) Fact Sheet for Patients: BloggerCourse.com  Fact Sheet for Healthcare Providers: SeriousBroker.it  This test is not yet approved or cleared by the [place]  FDA and has been authorized for detection and/or diagnosis of SARS-CoV-2 by FDA under an Emergency Use Authorization (EUA). This EUA will remain in effect (meaning this test can be used) for the duration of the COVID-19 declaration under Section 564(b)(1) of the Act, 21 U.S.C. section 360bbb-3(b)(1), unless the authorization is terminated or revoked.  Performed at [facility], [address] [place], [place] [address]   MRSA Next Gen by PCR, Nasal     Status: None   Collection Time: 07/11/24 10:35 PM   Specimen: Nasal Mucosa; Nasal Swab  Result Value Ref Range Status   MRSA by PCR Next Gen NOT DETECTED NOT DETECTED Final    Comment: (NOTE) The GeneXpert MRSA Assay (FDA approved for NASAL specimens only), is one component of a comprehensive MRSA  colonization surveillance program. It is not intended to diagnose MRSA infection nor to guide or monitor treatment for MRSA infections.  Test performance is not FDA approved in patients less than 39 years old. Performed at [facility], [address] [place], [place] [address]   Respiratory (~20 pathogens) panel by PCR     Status: None   Collection Time: 07/12/24  2:15 PM   Specimen: Nasopharyngeal Swab; Respiratory  Result Value Ref Range Status   Adenovirus NOT DETECTED NOT DETECTED Final   Coronavirus 229E NOT DETECTED NOT DETECTED Final    Comment: (NOTE) The Coronavirus on the Respiratory Panel, DOES NOT test for the novel  Coronavirus (2019 nCoV)    Coronavirus HKU1 NOT DETECTED NOT DETECTED Final   Coronavirus NL63 NOT DETECTED NOT DETECTED Final   Coronavirus OC43 NOT DETECTED NOT DETECTED Final   Metapneumovirus NOT DETECTED NOT DETECTED Final   Rhinovirus / Enterovirus NOT DETECTED NOT DETECTED Final   Influenza A NOT DETECTED NOT DETECTED Final   Influenza B NOT DETECTED NOT DETECTED Final   Parainfluenza Virus 1 NOT DETECTED NOT DETECTED Final   Parainfluenza Virus 2 NOT DETECTED NOT DETECTED Final   Parainfluenza Virus 3 NOT DETECTED NOT DETECTED Final   Parainfluenza Virus 4 NOT DETECTED NOT DETECTED Final   Respiratory Syncytial Virus NOT DETECTED NOT DETECTED Final   Bordetella pertussis NOT DETECTED NOT DETECTED Final   Bordetella Parapertussis NOT DETECTED NOT DETECTED Final   Chlamydophila pneumoniae NOT DETECTED NOT DETECTED Final   Mycoplasma pneumoniae NOT DETECTED NOT DETECTED Final    Comment: Performed at [facility], [address] [place], [place] [address]  Culture, Respiratory w Gram Stain     Status: None   Collection Time: 07/16/24  4:02 PM   Specimen: Tracheal Aspirate; Respiratory  Result Value Ref Range Status   Specimen Description   Final    TRACHEAL ASPIRATE Performed at [facility], [address]  [place], [place] [address]    Special Requests   Final    NONE Performed at [facility], [address] [place], [place] [address]    Gram Stain   Final    RARE WBC PRESENT, PREDOMINANTLY PMN NO ORGANISMS SEEN    Culture   Final    NO GROWTH 2 DAYS Performed at [facility], [address] [place], [place] [address]    Report Status 07/19/2024 FINAL  Final  MRSA Next Gen by PCR, Nasal     Status: None   Collection Time: 07/17/24  8:26 AM   Specimen: Nasal Mucosa; Nasal Swab  Result Value Ref Range Status   MRSA by PCR Next Gen NOT DETECTED NOT DETECTED Final    Comment: (NOTE) The GeneXpert MRSA Assay (FDA approved for NASAL specimens only), is one component of a comprehensive MRSA colonization surveillance program. It is not intended to diagnose MRSA infection nor to guide or monitor treatment for MRSA infections. Test performance is not FDA approved in patients less than 40 years old. Performed at [facility], [address] [place], [place] [address]   Culture, blood (Routine X 2) w Reflex to ID Panel     Status: None   Collection Time: 07/17/24  9:57 AM   Specimen: BLOOD  Result Value Ref Range Status   Specimen Description BLOOD BLOOD LEFT HAND  Final   Special Requests   Final    BOTTLES DRAWN AEROBIC AND ANAEROBIC Blood Culture adequate volume   Culture   Final    NO GROWTH 5 DAYS Performed at [facility], [address]  [place], [place] [address]    Report Status 07/22/2024 FINAL  Final  Culture, blood (Routine X 2) w Reflex to ID Panel     Status: None   Collection Time: 07/17/24  9:57 AM   Specimen: BLOOD  Result Value Ref Range Status   Specimen Description BLOOD BLOOD LEFT WRIST  Final   Special Requests   Final    BOTTLES DRAWN AEROBIC AND ANAEROBIC Blood Culture adequate volume   Culture   Final    NO GROWTH 5 DAYS Performed at [facility], [address], [place], [place] [address]    Report Status  07/22/2024 FINAL  Final  Culture, Respiratory w Gram Stain     Status: None   Collection Time: 07/23/24 11:32 AM   Specimen: Sputum  Result Value Ref Range Status   Specimen Description   Final    SPU Performed at [facility], [address], [place], [place] [address]    Special Requests   Final    NONE Performed at [facility], [address] [place], [place] [address]    Gram Stain   Final    RARE WBC PRESENT, PREDOMINANTLY PMN RARE GRAM POSITIVE COCCI    Culture   Final    Normal respiratory flora-no Staph aureus or Pseudomonas seen Performed at [facility], [address], [place], [place] [address]    Report Status 07/25/2024 FINAL  Final  MRSA Next Gen by PCR, Nasal     Status: None   Collection Time: 07/24/24 11:08 AM   Specimen: Nasal Mucosa; Nasal Swab  Result Value Ref Range Status   MRSA by PCR Next Gen NOT DETECTED NOT DETECTED Final    Comment: (NOTE) The GeneXpert MRSA Assay (FDA approved for NASAL specimens only), is one component of a comprehensive MRSA colonization surveillance program. It is not intended to diagnose MRSA infection nor to guide or monitor treatment for MRSA infections. Test performance is not FDA approved in patients less than 56 years old. Performed at [facility], [address] [place], [place] [address]     Labs: CBC: Recent Labs  Lab 07/25/24 [phone] 07/26/24 0426 07/27/24 0535 07/30/24 0552  WBC 10.5 16.1* 13.4* 8.8  HGB 7.7* 8.7* 8.7* 8.5*  HCT 26.2* 28.3* 28.4* 27.2*  MCV 95.6 93.4 93.7 90.7  PLT 231 274 251 310   Basic Metabolic Panel: Recent Labs  Lab 07/25/24 0439 07/25/24 1334 07/26/24 0426 07/26/24 1353 07/27/24 0536 07/28/24 0615 07/29/24 0324 07/30/24 0552 07/31/24 0705  NA 144   < > 137   < > 138 138 136 137 138  K 4.8   < > 3.7   < > 3.2* 3.6 3.6 3.6 3.9  CL 99   < > 94*   < > 97* 99 96* 97* 101  CO2 34*   < > 32   < > 27 29 27 29 28   GLUCOSE 125*   < > 170*    < > 115* 118* 102* 138* 143*  BUN 54*   < > 53*   < > 41* 35* 34* 41* 40*  CREATININE 1.15   < > 1.38*   < > 1.39* 1.32* 1.46* 1.36* 1.33*  CALCIUM  8.5*   < > 8.6*   < > 8.8* 9.5 9.0 8.8* 8.7*  MG 2.9*  --  2.4  --  2.0 1.9  --   --   --   PHOS 5.0*  --  4.0  --   --   --   --   --   --    < > =  values in this interval not displayed.   Liver Function Tests: No results for input(s): AST, ALT, ALKPHOS, BILITOT, PROT, ALBUMIN  in the last 168 hours. CBG: Recent Labs  Lab 07/25/24 0813 07/25/24 1149 07/25/24 1615 07/26/24 0738 07/26/24 1254  GLUCAP 102* 127* 148* 128* 158*    Discharge time spent: greater than 30 minutes.  This record has been created using Conservation officer, historic buildings. Errors have been sought and corrected,but may not always be located. Such creation errors do not reflect on the standard of care.   Signed: Amaryllis Dare, MD [facility] 07/31/2024

## 2024-07-31 NOTE — Plan of Care (Signed)
 Problem: Education: Goal: Knowledge of General Education information will improve Description: Including pain rating scale, medication(s)/side effects and non-pharmacologic comfort measures Outcome: Adequate for Discharge   Problem: Health Behavior/Discharge Planning: Goal: Ability to manage health-related needs will improve Outcome: Adequate for Discharge   Problem: Clinical Measurements: Goal: Ability to maintain clinical measurements within normal limits will improve Outcome: Adequate for Discharge Goal: Will remain free from infection Outcome: Adequate for Discharge Goal: Diagnostic test results will improve Outcome: Adequate for Discharge Goal: Respiratory complications will improve Outcome: Adequate for Discharge Goal: Cardiovascular complication will be avoided Outcome: Adequate for Discharge   Problem: Activity: Goal: Risk for activity intolerance will decrease Outcome: Adequate for Discharge   Problem: Nutrition: Goal: Adequate nutrition will be maintained Outcome: Adequate for Discharge   Problem: Coping: Goal: Level of anxiety will decrease Outcome: Adequate for Discharge   Problem: Elimination: Goal: Will not experience complications related to bowel motility Outcome: Adequate for Discharge Goal: Will not experience complications related to urinary retention Outcome: Adequate for Discharge   Problem: Pain Managment: Goal: General experience of comfort will improve and/or be controlled Outcome: Adequate for Discharge   Problem: Safety: Goal: Ability to remain free from injury will improve Outcome: Adequate for Discharge   Problem: Skin Integrity: Goal: Risk for impaired skin integrity will decrease Outcome: Adequate for Discharge   Problem: Education: Goal: Knowledge of disease or condition will improve Outcome: Adequate for Discharge Goal: Knowledge of the prescribed therapeutic regimen will improve Outcome: Adequate for Discharge Goal:  Individualized Educational Video(s) Outcome: Adequate for Discharge   Problem: Activity: Goal: Ability to tolerate increased activity will improve Outcome: Adequate for Discharge Goal: Will verbalize the importance of balancing activity with adequate rest periods Outcome: Adequate for Discharge   Problem: Respiratory: Goal: Ability to maintain a clear airway will improve Outcome: Adequate for Discharge Goal: Levels of oxygenation will improve Outcome: Adequate for Discharge Goal: Ability to maintain adequate ventilation will improve Outcome: Adequate for Discharge   Problem: Activity: Goal: Ability to tolerate increased activity will improve Outcome: Adequate for Discharge   Problem: Clinical Measurements: Goal: Ability to maintain a body temperature in the normal range will improve Outcome: Adequate for Discharge   Problem: Respiratory: Goal: Ability to maintain adequate ventilation will improve Outcome: Adequate for Discharge Goal: Ability to maintain a clear airway will improve Outcome: Adequate for Discharge   Problem: Inadequate Intake (NI-2.1) Goal: Food and/or nutrient delivery Description: Individualized approach for food/nutrient provision. Outcome: Adequate for Discharge   Problem: Activity: Goal: Ability to tolerate increased activity will improve Outcome: Adequate for Discharge   Problem: Respiratory: Goal: Ability to maintain a clear airway and adequate ventilation will improve Outcome: Adequate for Discharge   Problem: Role Relationship: Goal: Method of communication will improve Outcome: Adequate for Discharge   Problem: Acute Rehab PT Goals(only PT should resolve) Goal: Pt Will Go Supine/Side To Sit Outcome: Adequate for Discharge Goal: Pt Will Go Sit To Supine/Side Outcome: Adequate for Discharge Goal: Patient Will Transfer Sit To/From Stand Outcome: Adequate for Discharge Goal: Pt Will Ambulate Outcome: Adequate for Discharge   Problem: SLP  Dysphagia Goals Goal: Patient will demonstrate readiness for PO's Description: Patient will demonstrate readiness for PO's and/or instrumental swallow study as evidenced by: Outcome: Adequate for Discharge   Problem: Acute Rehab OT Goals (only OT should resolve) Goal: Pt. Will Perform Lower Body Bathing Outcome: Adequate for Discharge Goal: Pt. Will Perform Lower Body Dressing Outcome: Adequate for Discharge Goal: Pt. Will Transfer To Toilet Outcome: Adequate  for Discharge

## 2024-07-31 NOTE — Plan of Care (Signed)
  Problem: Education: Goal: Knowledge of General Education information will improve Description: Including pain rating scale, medication(s)/side effects and non-pharmacologic comfort measures Outcome: Progressing   Problem: Health Behavior/Discharge Planning: Goal: Ability to manage health-related needs will improve Outcome: Progressing   Problem: Clinical Measurements: Goal: Ability to maintain clinical measurements within normal limits will improve Outcome: Progressing Goal: Will remain free from infection Outcome: Progressing Goal: Diagnostic test results will improve Outcome: Progressing Goal: Respiratory complications will improve Outcome: Progressing Goal: Cardiovascular complication will be avoided Outcome: Progressing   Problem: Activity: Goal: Risk for activity intolerance will decrease Outcome: Progressing   Problem: Nutrition: Goal: Adequate nutrition will be maintained Outcome: Progressing   Problem: Coping: Goal: Level of anxiety will decrease Outcome: Progressing   Problem: Elimination: Goal: Will not experience complications related to bowel motility Outcome: Progressing Goal: Will not experience complications related to urinary retention Outcome: Progressing   Problem: Pain Managment: Goal: General experience of comfort will improve and/or be controlled Outcome: Progressing   Problem: Safety: Goal: Ability to remain free from injury will improve Outcome: Progressing   Problem: Skin Integrity: Goal: Risk for impaired skin integrity will decrease Outcome: Progressing   Problem: Education: Goal: Knowledge of disease or condition will improve Outcome: Progressing Goal: Knowledge of the prescribed therapeutic regimen will improve Outcome: Progressing Goal: Individualized Educational Video(s) Outcome: Progressing   Problem: Activity: Goal: Ability to tolerate increased activity will improve Outcome: Progressing Goal: Will verbalize the  importance of balancing activity with adequate rest periods Outcome: Progressing   Problem: Respiratory: Goal: Ability to maintain a clear airway will improve Outcome: Progressing Goal: Levels of oxygenation will improve Outcome: Progressing Goal: Ability to maintain adequate ventilation will improve Outcome: Progressing   Problem: Activity: Goal: Ability to tolerate increased activity will improve Outcome: Progressing   Problem: Clinical Measurements: Goal: Ability to maintain a body temperature in the normal range will improve Outcome: Progressing   Problem: Respiratory: Goal: Ability to maintain adequate ventilation will improve Outcome: Progressing Goal: Ability to maintain a clear airway will improve Outcome: Progressing   Problem: Activity: Goal: Ability to tolerate increased activity will improve Outcome: Progressing   Problem: Respiratory: Goal: Ability to maintain a clear airway and adequate ventilation will improve Outcome: Progressing   Problem: Role Relationship: Goal: Method of communication will improve Outcome: Progressing

## 2024-07-31 NOTE — Progress Notes (Signed)
 [facility] CLINIC CARDIOLOGY PROGRESS NOTE       Patient ID: [name] MRN: [id number] DOB/AGE: 1961-07-15 63 y.o.  Admit date: 07/11/2024 Referring Physician Dr. Charlie Sellar Primary Physician Osa Geralds, NP  Primary Cardiologist Tinnie Maiden, NP  Reason for Consultation elevated troponins  HPI: [name] is a 63 y.o. 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 Found to have pneumonia, overnight troponins uptrending. Cardiology was consulted for further evaluation.   Interval history: -Patient seen and examined this AM, resting in bedside chair in no acute distress. Patient states breathing feels okay today but overall better. Denies chest pain/pressure, palpitations or lightheadedness. -Continues to report generalized weakness and soreness on his sides. -Patient BP and HR stable this AM. Per tele no recurrence of ventricular ectopy. -Weaned to 1L with stable SpO2. -Pending SNF placement   Review of systems complete and found to be negative unless listed above    Past Medical History:  Diagnosis Date   COPD (chronic obstructive pulmonary disease) (HCC)    CVA (cerebral infarction)    Headache    mild, since stroke 2012   Hypertension    MI (myocardial infarction) (HCC) 05/09/2012   Reading difficulty    pt reports he reads at about a second grade level   Stroke [facility]) 2012   numbness to left hand    Wears dentures    full upper and lower    Past Surgical History:  Procedure Laterality Date   CAROTID ENDARTERECTOMY Left    2012 or 2013   CHOLECYSTECTOMY N/A 07/26/2016   Procedure: LAPAROSCOPIC CHOLECYSTECTOMY;  Surgeon:  Charlie FORBES Fell, MD;  Location: [facility] ORS;  Service: General;  Laterality: N/A;   COLONOSCOPY N/A 05/08/2024   Procedure: COLONOSCOPY;  Surgeon: Jinny Carmine, MD;  Location: [facility] ENDOSCOPY;  Service: Endoscopy;  Laterality: N/A;   COLONOSCOPY WITH PROPOFOL  N/A 02/09/2022   Procedure: COLONOSCOPY WITH PROPOFOL ;  Surgeon: Jinny Carmine, MD;  Location: [facility] ENDOSCOPY;  Service: Endoscopy;  Laterality: N/A;   CORONARY ARTERY BYPASS GRAFT  05/10/2012   3 vessel   CORONARY STENT INTERVENTION N/A 07/02/2024   Procedure: CORONARY STENT INTERVENTION;  Surgeon: Katina Albright, MD;  Location: [facility] INVASIVE CV LAB;  Service: Cardiovascular;  Laterality: N/A;   CORONARY ULTRASOUND/IVUS N/A 07/02/2024   Procedure: Coronary Ultrasound/IVUS;  Surgeon: Katina Albright, MD;  Location: [facility] INVASIVE CV LAB;  Service: Cardiovascular;  Laterality: N/A;   ENTEROSCOPY N/A 05/16/2023   Procedure: ENTEROSCOPY;  Surgeon: Therisa Bi, MD;  Location: [facility] ENDOSCOPY;  Service: Gastroenterology;  Laterality: N/A;   ENTEROSCOPY N/A 06/01/2023   Procedure: ENTEROSCOPY;  Surgeon: Unk Corinn Skiff, MD;  Location: [facility] ENDOSCOPY;  Service: Gastroenterology;  Laterality: N/A;   ESOPHAGOGASTRODUODENOSCOPY  05/13/2023   Procedure: ESOPHAGOGASTRODUODENOSCOPY (EGD);  Surgeon: Jinny Carmine, MD;  Location: [facility] ENDOSCOPY;  Service: Endoscopy;;   ESOPHAGOGASTRODUODENOSCOPY (EGD) WITH PROPOFOL  N/A 10/28/2022   Procedure: ESOPHAGOGASTRODUODENOSCOPY (EGD) WITH PROPOFOL ;  Surgeon: Onita Elspeth Sharper, DO;  Location: [facility] ENDOSCOPY;  Service: Gastroenterology;  Laterality: N/A;   GIVENS CAPSULE STUDY  05/13/2023   Procedure: GIVENS CAPSULE STUDY;  Surgeon: Jinny Carmine, MD;  Location: [facility] ENDOSCOPY;  Service: Endoscopy;;   GIVENS CAPSULE STUDY  06/01/2023   Procedure: GIVENS CAPSULE STUDY;  Surgeon: Unk Corinn Skiff, MD;  Location: [facility] ENDOSCOPY;  Service: Gastroenterology;;   LEFT HEART CATH AND CORS/GRAFTS ANGIOGRAPHY N/A 07/02/2024   Procedure:  LEFT HEART CATH AND CORS/GRAFTS ANGIOGRAPHY;  Surgeon: Katina Albright, MD;  Location: [facility] INVASIVE CV LAB;  Service: Cardiovascular;  Laterality: N/A;    Medications Prior to Admission  Medication Sig Dispense Refill Last Dose/Taking   albuterol  (VENTOLIN  HFA) 108 (90 Base) MCG/ACT inhaler Inhale 2 puffs into the lungs every 6 (six) hours as needed for wheezing or shortness of breath. 6.7 g 2 07/11/2024 Morning   ascorbic acid  (VITAMIN C ) 500 MG tablet Take 1 tablet (500 mg total) by mouth daily. 30 tablet 2 07/11/2024 Morning   aspirin  EC 81 MG tablet Take 1 tablet (81 mg total) by mouth daily. Swallow whole. 30 tablet 5 07/11/2024 Morning   clopidogrel  (PLAVIX ) 75 MG tablet Take 1 tablet (75 mg total) by mouth daily with breakfast. 30 tablet 11 07/11/2024 Morning   dapagliflozin  propanediol (FARXIGA ) 10 MG TABS tablet Take 1 tablet (10 mg total) by mouth daily. 30 tablet 11 07/11/2024 Morning   folic acid  (FOLVITE ) 1 MG tablet Take 1 tablet (1 mg total) by mouth daily. 30 tablet 2 07/11/2024   iron  polysaccharides (NIFEREX) 150 MG capsule Take 1 capsule (150 mg total) by mouth daily. 30 capsule 2 07/11/2024   isosorbide  mononitrate (IMDUR ) 60 MG 24 hr tablet Take 1 tablet (60 mg total) by mouth daily. 30 tablet 11 07/11/2024   losartan  (COZAAR ) 25 MG tablet Take 0.5 tablets (12.5 mg total) by mouth daily. 30 tablet 11 07/11/2024 Morning   metoprolol  succinate (TOPROL -XL) 25 MG 24 hr tablet Take 0.5 tablets (12.5 mg total) by mouth daily. 30 tablet 11 07/11/2024 Morning   pantoprazole  (PROTONIX ) 40 MG tablet Take 1 tablet (40 mg total) by mouth daily. 30 tablet 2 07/11/2024 Morning   pravastatin  (PRAVACHOL ) 20 MG tablet Take 1 tablet (20 mg total) by mouth at bedtime. 30 tablet 11 07/10/2024 Evening   spironolactone  (ALDACTONE ) 25 MG tablet Take 0.5 tablets (12.5 mg total) by mouth daily. 30 tablet 11 07/11/2024 Morning   torsemide  (DEMADEX ) 10 MG tablet Take 1 tablet (10 mg total) by mouth daily. 30 tablet 11  07/11/2024   Social History   Socioeconomic History   Marital status: Divorced    Spouse name: Not on file   Number of children: Not on file   Years of education: Not on file   Highest education level: Not on file  Occupational History   Not on file  Tobacco Use   Smoking status: Former    Current packs/day: 0.00    Types: Cigarettes    Quit date: 63    Years since quitting: 32.6   Smokeless tobacco: Current    Types: Snuff   Tobacco comments:    changed to dip  Vaping Use   Vaping status: Never Used  Substance and Sexual Activity   Alcohol  use: Yes    Alcohol /week: 26.0 standard drinks of alcohol     Types: 26 Cans of beer per week   Drug use: No   Sexual activity: Yes    Birth control/protection: None  Other Topics Concern   Not on file  Social History Narrative   Not on file   Social Drivers of Health   Financial Resource Strain: Not on file  Food Insecurity: No Food Insecurity (07/12/2024)   Hunger Vital Sign    Worried About Running  Out of Food in the Last Year: Never true    Ran Out of Food in the Last Year: Never true  Transportation Needs: No Transportation Needs (07/12/2024)   PRAPARE - Administrator, Civil Service (Medical): No    Lack of Transportation (Non-Medical): No  Physical Activity: Not on file  Stress: Not on file  Social Connections: Socially Isolated (07/12/2024)   Social Connection and Isolation Panel    Frequency of Communication with Friends and Family: Once a week    Frequency of Social Gatherings with Friends and Family: Once a week    Attends Religious Services: Never    Database administrator or Organizations: No    Attends Banker Meetings: Never    Marital Status: Divorced  Catering manager Violence: Not At Risk (07/12/2024)   Humiliation, Afraid, Rape, and Kick questionnaire    Fear of Current or Ex-Partner: No    Emotionally Abused: No    Physically Abused: No    Sexually Abused: No    Family History   Problem Relation Age of Onset   Pneumonia Mother      Vitals:   07/31/24 0424 07/31/24 0500 07/31/24 0756 07/31/24 0823  BP: 113/67   101/65  Pulse: 81   76  Resp:      Temp: 97.8 F (36.6 C)   97.9 F (36.6 C)  TempSrc: Oral   Oral  SpO2: 100%  98% 100%  Weight:  97.9 kg    Height:        PHYSICAL EXAM General: Ill appearing male, no acute distress. HEENT: Normocephalic and atraumatic. Neck: No JVD.  Lungs: Normal respiratory effort on 1L. CTAB Heart: HRRR, Normal S1 and S2 without gallops. +systolic ejection murmur.   Abdomen: Non-distended appearing.  Msk: Normal strength and tone for age. Extremities: Warm and well perfused. No clubbing, cyanosis, trace pedal edema.   Labs: Basic Metabolic Panel: Recent Labs    07/30/24 0552 07/31/24 0705  NA 137 138  K 3.6 3.9  CL 97* 101  CO2 29 28  GLUCOSE 138* 143*  BUN 41* 40*  CREATININE 1.36* 1.33*  CALCIUM  8.8* 8.7*   Liver Function Tests: No results for input(s): AST, ALT, ALKPHOS, BILITOT, PROT, ALBUMIN  in the last 72 hours.  No results for input(s): LIPASE, AMYLASE in the last 72 hours. CBC: Recent Labs    07/30/24 0552  WBC 8.8  HGB 8.5*  HCT 27.2*  MCV 90.7  PLT 310   Cardiac Enzymes: No results for input(s): CKTOTAL, CKMB, CKMBINDEX, TROPONINIHS in the last 72 hours.    BNP: Recent Labs    07/29/24 0752  BNP 977.9*     D-Dimer: No results for input(s): DDIMER in the last 72 hours. Hemoglobin A1C: No results for input(s): HGBA1C in the last 72 hours. Fasting Lipid Panel: No results for input(s): CHOL, HDL, LDLCALC, TRIG, CHOLHDL, LDLDIRECT in the last 72 hours.  Thyroid Function Tests: No results for input(s): TSH, T4TOTAL, T3FREE, THYROIDAB in the last 72 hours.  Invalid input(s): FREET3 Anemia Panel: No results for input(s): VITAMINB12, FOLATE, FERRITIN, TIBC, IRON , RETICCTPCT in the last 72 hours.   Radiology: [facility]  Chest Port 1 View Result Date: 07/29/2024 CLINICAL DATA:  Congestive heart failure. EXAM: PORTABLE CHEST 1 VIEW COMPARISON:  07/24/2024 FINDINGS: The enteric tube has been removed. Post median sternotomy with stable cardiomegaly. Bilateral pleural effusions and bibasilar opacities, slightly worsened in the interim. Improved interstitial opacities with mild residual. Underlying emphysematous change.  No pneumothorax. IMPRESSION: 1. Bilateral pleural effusions and bibasilar opacities, slightly worsened in the interim. 2. Improved interstitial opacities with mild residual. Electronically Signed   By: Andrea Gasman M.D.   On: 07/29/2024 09:53   DG Chest 1 View Result Date: 07/24/2024 CLINICAL DATA:  [id number] Dyspnea [id number] EXAM: CHEST  1 VIEW COMPARISON:  July 23, 2024, July 11, 2024 FINDINGS: Esophagogastric tube terminates below the diaphragm, outside the field of view. Diffuse interstitial opacities throughout both lungs. No lobar consolidation or pneumothorax. Lateral right upper lobe nodular opacity measures 1.9 cm, corresponding to the lung nodule on the prior chest CT. Moderate cardiomegaly. Sternotomy wires and CABG markers. No acute fracture or destructive lesions. Multilevel thoracic osteophytosis. IMPRESSION: 1. Moderate cardiomegaly. Unchanged interstitial opacities throughout both lungs, possibly persistent interstitial edema and underlying emphysematous changes. 2. Similarly appearing lateral right upper lung zone nodular opacity. Please see the comparison chest CT for further characterization. Electronically Signed   By: Rogelia Myers M.D.   On: 07/24/2024 08:43   DG Chest Port 1 View Result Date: 07/23/2024 CLINICAL DATA:  Shortness of breath. EXAM: PORTABLE CHEST 1 VIEW COMPARISON:  July 22, 2024. FINDINGS: The heart size and mediastinal contours are within normal limits. Status post coronary bypass graft. Mild diffuse interstitial densities are noted concerning for possible pulmonary edema  with possible small pleural effusions. Nasogastric tube is seen entering stomach. The visualized skeletal structures are unremarkable. IMPRESSION: Mild diffuse interstitial densities are noted concerning for possible pulmonary edema with small pleural effusions. Electronically Signed   By: Lynwood Landy Raddle M.D.   On: 07/23/2024 13:03   DG ABD ACUTE 2+V W 1V CHEST Result Date: 07/22/2024 CLINICAL DATA:  [id number] Dyspnea [id number] [phone] Encounter for imaging study to confirm nasogastric (NG) tube placement [id number] EXAM: DG ABDOMEN ACUTE WITH 1 VIEW CHEST COMPARISON:  Chest x-ray 07/19/2024 FINDINGS: Enteric tube courses below the diaphragm with tip and side port overlying the expected region of the gastric lumen. The heart and mediastinal contours are within normal limits. No focal consolidation. Chronic coarsened interstitial markings with no overt pulmonary edema. Bilateral trace pleural effusion. No pneumothorax. Right upper quadrant surgical clips. There is no evidence of dilated bowel loops or free intraperitoneal air. No radiopaque calculi or other significant radiographic abnormality is seen. No acute osseous abnormality.  Intact sternotomy wires. IMPRESSION: 1. Enteric tube in good position. 2. Bowel trace pleural effusions. 3. Nonobstructive bowel gas pattern. Electronically Signed   By: Morgane  Naveau M.D.   On: 07/22/2024 14:18   DG Chest Port 1 View Result Date: 07/19/2024 CLINICAL DATA:  Dyspnea EXAM: PORTABLE CHEST 1 VIEW COMPARISON:  07/18/2024 FINDINGS: Two frontal views of the chest demonstrate an enteric catheter passing below diaphragm, tip excluded by collimation. Left internal jugular catheter tip overlies superior vena cava unchanged. 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 The cardiac silhouette is stable. Mild central vascular congestion, with a trace right pleural effusion unchanged since prior exam. The nodular density projecting over the right upper lateral chest on prior study is less well  seen on this exam. This may be due to positioning. No pneumothorax. No acute bony abnormalities. IMPRESSION: 1. Pulmonary vascular congestion and small right pleural effusion, unchanged since prior study. 2. The nodular consolidation within the right upper lateral chest seen on prior x-ray and CT is not well seen on this exam, likely due to patient positioning. Electronically Signed   By: Ozell Daring M.D.   On: 07/19/2024 17:34   DG Chest Port 1  View Result Date: 07/18/2024 EXAM: 1 VIEW XRAY OF THE CHEST 07/18/2024 04:59:06 AM COMPARISON: 07/16/2024 CLINICAL HISTORY: [id number] Acute respiratory failure with hypoxia (HCC) 427266. Acute respiratory failure with hypoxia FINDINGS: LUNGS AND PLEURA: Nodular opacity within the periphery of the right upper lobe appears similar to CT from 07/11/2024. Please refer to that report for follow-up recommendations. Mild asymmetric hazy opacification over the right mid and right lower lung may be related to positional artifact versus small posterior layering effusion. Mild subsegmental atelectasis in the left base. HEART AND MEDIASTINUM: Stable cardiomediastinal contours. Previous median sternotomy and CABG procedure. BONES AND SOFT TISSUES: Aortic atherosclerotic calcification. Left IJ catheter is identified with the tip in the projection of the SVC. There is an enteric tube which courses below the field of view. ET tube tip is stable above the carina. IMPRESSION: 1. Stable nodular opacity within the periphery of the right upper lobe, similar to CT from 07/11/2024. Please refer to that report for follow-up recommendations. 2. Mild asymmetric hazy opacification over the right mid and right lower lung, possibly related to rotational artifact versus small posterior layering effusion. 3. Mild subsegmental atelectasis in the left base. Electronically signed by: Waddell Calk MD 07/18/2024 06:51 AM EDT RP Workstation: [id number]   DG Chest Port 1 View Result Date:  07/16/2024 CLINICAL DATA:  Endotracheal tube and central line placement. EXAM: PORTABLE CHEST 1 VIEW COMPARISON:  07/16/2024 at 8:58 a.m. FINDINGS: Patient is rotated to the right. Endotracheal tube has tip proximally 13.5 cm above the carina. This could be advanced approximately 6 cm. Left IJ central venous catheter has tip over the SVC. Enteric tube courses into the region of the stomach and off the image as tip is not visualized. Lungs are adequately inflated with possible mild hazy opacification over the right upper lung abutting the minor fissure which may be due to atelectasis or infection. Left lung is clear. Cardiomediastinal silhouette and remainder of the exam is unchanged. IMPRESSION: 1. Endotracheal tube has tip proximally 13.5 cm above the carina. This could be advanced approximately 6 cm. 2. Left IJ central venous catheter with tip over the SVC. 3. Possible mild hazy opacification over the right upper lung abutting the minor fissure which may be due to atelectasis or infection. Electronically Signed   By: Toribio Agreste M.D.   On: 07/16/2024 10:53   DG Chest Port 1 View Result Date: 07/16/2024 CLINICAL DATA:  [id number] Aspiration into airway [id number]. EXAM: PORTABLE CHEST 1 VIEW COMPARISON:  06/23/2024. FINDINGS: Bilateral lungs appear hyperlucent with coarse bronchovascular markings, in keeping with COPD. Bilateral lungs otherwise appear clear. No dense consolidation or lung collapse. Bilateral costophrenic angles are clear. Stable cardio-mediastinal silhouette. There are surgical staples along the heart border and sternotomy wires, status post CABG (coronary artery bypass graft). No acute osseous abnormalities. The soft tissues are within normal limits. Enteric tube is seen coursing below the left hemidiaphragm however, the tip is not included on the film. IMPRESSION: No active disease. COPD. Electronically Signed   By: Ree Molt M.D.   On: 07/16/2024 09:09   DG Abd 1 View Result Date:  07/16/2024 CLINICAL DATA:  Vomiting.  NG tube placement. EXAM: ABDOMEN - 1 VIEW COMPARISON:  07/15/2024 FINDINGS: Nasogastric tube has tip and side-port over the stomach in the upper abdomen just left of midline. Visualized lower thorax unchanged. Abdominopelvic images demonstrate a non obstructive bowel gas pattern. Mild fecal retention throughout the colon. Surgical clips over the right upper quadrant. There are degenerative changes of the  spine and hips. Calcified plaque over the abdominal aorta and iliac arteries. IMPRESSION: 1. Nasogastric tube with tip and side-port over the stomach. 2. Nonobstructive bowel gas pattern. Mild fecal retention. 3. Aortic atherosclerosis. Electronically Signed   By: Toribio Agreste M.D.   On: 07/16/2024 07:51   DG Abd 1 View Result Date: 07/15/2024 CLINICAL DATA:  Orogastric tube placement. EXAM: ABDOMEN - 1 VIEW COMPARISON:  June 29, 2023. FINDINGS: Distal tip of nasogastric tube is seen in expected position of proximal stomach. IMPRESSION: Nasogastric tube tip seen in expected position of proximal stomach. Electronically Signed   By: Lynwood Landy Raddle M.D.   On: 07/15/2024 10:25   DG Chest Port 1 View Result Date: 07/13/2024 CLINICAL DATA:  Respiratory failure with hypoxia. EXAM: PORTABLE CHEST 1 VIEW COMPARISON:  Multiple previous chest x-rays and recent chest CT. FINDINGS: Stable surgical changes from bypass surgery. The cardiac silhouette, mediastinal and hilar contours are within normal limits and stable. Underlying emphysematous changes and pulmonary scarring. No pleural effusions or discrete pulmonary infiltrates. No pneumothorax. IMPRESSION: Emphysematous changes and pulmonary scarring but no acute overlying pulmonary process. Electronically Signed   By: MYRTIS Stammer M.D.   On: 07/13/2024 16:08   US  Abdomen Limited RUQ (LIVER/GB) Result Date: 07/13/2024 CLINICAL DATA:  Portal hypertension. EXAM: ULTRASOUND ABDOMEN LIMITED RIGHT UPPER QUADRANT COMPARISON:  Abdominal  ultrasound 10/19/2022 FINDINGS: Gallbladder: Surgically absent. Common bile duct: Diameter: 3.3 mm Liver: No focal lesion identified. Parenchymal echogenicity is increased and diffusely heterogeneous. There is lobular liver contour. Portal vein is patent on color Doppler imaging with normal direction of blood flow towards the liver. Other: None. IMPRESSION: Lobular liver contour with increased echogenicity and heterogeneous parenchyma consistent with cirrhosis. No focal liver lesion identified. Electronically Signed   By: Greig Pique M.D.   On: 07/13/2024 16:03   DG Chest Port 1 View Result Date: 07/12/2024 CLINICAL DATA:  Shortness of breath. EXAM: PORTABLE CHEST 1 VIEW COMPARISON:  07/11/2024 FINDINGS: The cardio pericardial silhouette is enlarged. There is pulmonary vascular congestion without overt pulmonary edema. Trace bilateral pleural effusions. Old posterior left sixth rib fracture. Telemetry leads overlie the chest. IMPRESSION: Enlargement of the cardiopericardial silhouette with new pulmonary vascular congestion and trace bilateral pleural effusions. Electronically Signed   By: Camellia Candle M.D.   On: 07/12/2024 12:11   CT Angio Chest PE W/Cm &/Or Wo Cm Result Date: 07/11/2024 CLINICAL DATA:  High probability for PE. Chest pain with shortness of breath EXAM: CT ANGIOGRAPHY CHEST WITH CONTRAST TECHNIQUE: Multidetector CT imaging of the chest was performed using the standard protocol during bolus administration of intravenous contrast. Multiplanar CT image reconstructions and MIPs were obtained to evaluate the vascular anatomy. RADIATION DOSE REDUCTION: This exam was performed according to the departmental dose-optimization program which includes automated exposure control, adjustment of the mA and/or kV according to patient size and/or use of iterative reconstruction technique. CONTRAST:  75mL OMNIPAQUE  IOHEXOL  350 MG/ML SOLN COMPARISON:  CT angiogram chest 05/09/2024 FINDINGS: Cardiovascular:  Satisfactory opacification of the pulmonary arteries to the segmental level. No evidence of pulmonary embolism. Patient is status post cardiac surgery. The heart is enlarged. There is no pericardial effusion. There are atherosclerotic calcifications of the aorta and coronary arteries. Aorta is normal in size. Mediastinum/Nodes: There are enlarged right hilar lymph nodes measuring up to 11 mm. Enlarged paratracheal lymph nodes measure up to 10 mm. Visualized thyroid gland and esophagus are within normal limits. Lungs/Pleura: Moderate emphysema again seen. There is stable scarring in the right  lung apex. There is new patchy slightly nodular airspace disease in the right upper lobe measuring 2.0 by 1.1 cm. There is right-sided central peribronchial wall thickening. Previously identified airspace disease in the left lower lobe has resolved. There is no pleural effusion or pneumothorax. There is a stable left lower lobe pulmonary nodule image 6/57. Upper Abdomen: No acute abnormality. Musculoskeletal: No chest wall abnormality. No acute or significant osseous findings. Review of the MIP images confirms the above findings. IMPRESSION: 1. No evidence for pulmonary embolism. 2. New patchy slightly nodular airspace disease in the right upper lobe worrisome for pneumonia. Follow-up CT recommended in 4-6 weeks recommended to ensure resolution and to exclude underlying nodule. 3. Right-sided central peribronchial wall thickening worrisome for bronchitis. 4. Right hilar and mediastinal lymphadenopathy, likely reactive. 5. Stable left lower lobe pulmonary nodule. 6. Cardiomegaly. Aortic Atherosclerosis (ICD10-I70.0) and Emphysema (ICD10-J43.9). Electronically Signed   By: Greig Pique M.D.   On: 07/11/2024 18:54   DG Chest Portable 1 View Result Date: 07/11/2024 CLINICAL DATA:  SOB EXAM: PORTABLE CHEST - 1 VIEW COMPARISON:  June 24, 2024 FINDINGS: Biapical pleural thickening. Sternotomy wires and CABG markers. No focal  airspace consolidation, pleural effusion, or pneumothorax. Mild cardiomegaly. Tortuous aorta with aortic atherosclerosis. No acute fracture or destructive lesions. Multilevel thoracic osteophytosis. IMPRESSION: No acute cardiopulmonary abnormality. Electronically Signed   By: Rogelia Myers M.D.   On: 07/11/2024 17:25   CARDIAC CATHETERIZATION Result Date: 07/02/2024   Prox LAD to Mid LAD lesion is 90% stenosed.   Ost LM to Mid LM lesion is 50% stenosed.   Ost Cx to Prox Cx lesion is 90% stenosed.   Ost RCA to Prox RCA lesion is 100% stenosed.   Mid Graft to Dist Graft lesion is 95% stenosed.   Recommend uninterrupted dual antiplatelet therapy with Aspirin  81mg  daily and Clopidogrel  75mg  daily for a minimum of 6 months (stable ischemic heart disease-Class I recommendation). 1.  Severe native three-vessel CAD 2.  LIMA to LAD and SVG to OM widely patent 3.  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  Successful direct stenting of the distal SVG to PDA with distal protection with a 4.0 x 15 mm drug-eluting stent with intravascular ultrasound guidance 5.  Aspirin  and clopidogrel  for at least 6 months followed by clopidogrel  indefinitely 6.  Continue workup for aortic valve replacement    ECHO 04/2024: 1. Left ventricular ejection fraction, by estimation, is 60 to 65%. The left ventricle has normal function. The left ventricle has no regional wall motion abnormalities. The left ventricular internal cavity size was mildly dilated. Left ventricular diastolic parameters are consistent with Grade I diastolic dysfunction (impaired relaxation).   2. Right ventricular systolic function is normal. The right ventricular  size is normal.   3. The mitral valve is normal in structure. No evidence of mitral valve  regurgitation.   4. The aortic valve is normal in structure. Aortic valve regurgitation is  trivial. Severe aortic valve stenosis.   TELEMETRY reviewed by me 07/31/2024: sinus rhythm, rate  70s  EKG reviewed by me: sinus tachycardia ST depression, rate 129 bpm  Data reviewed by me 07/31/2024: last 24h vitals tele labs imaging I/O hospitalist progress note.  Principal Problem:   Acute respiratory distress Active Problems:   Hyperlipidemia   Cognitive impairment   Alcohol  abuse   Essential hypertension   CAD (coronary artery disease)   Stroke (HCC)   Iron  deficiency anemia   Acute on chronic diastolic  CHF (congestive heart failure) (HCC)   COPD exacerbation (HCC)   Elevated lactic acid level   Lobar pneumonia (HCC)   Metabolic acidosis, increased anion gap   Myocardial injury    ASSESSMENT AND PLAN:  [name] is a 63 y.o. 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 Found to have pneumonia, overnight troponins uptrending. Cardiology was consulted for further evaluation.   # Ventricular Ectopy Per tele patient with bigeminy PVCs, started on IV amio on 07/30 for ventricular ectopy. PVC much improved s/p IV amio. Per tele this AM, 1 episode of bigeminy on tele on 08/31.  No further recurrence of ventricular ectopy. -Continue to monitor telemetry closely.  -Monitor and replenish electrolytes for a goal K >4, Mag >2  -Continue Amio 400 mg BID for 10 days, then 200 mg daily.  -Continue metoprolol  to 25 mg daily.   # Acute hypoxic respiratory failure, mechanical ventilation # Aspiration pneumonia # Alcohol  withdrawal, improving # Acute on chronic HFpEF # Severe aortic stenosis Presented with SOB, cough, imaging concerning for HCAP/COPD exacerbation. LA elevated on presentation at 3.6 > 5.9> 3.4. s/p extubation 08/01. Increased O2 need to  10L, concern for aspiration event on 08/05. NGT removed (08/07). BNP elevated 1500 > 3200 (08/07). Fluid status much improved.  -Continue home torsemide  20 mg daily. Closely monitor renal function, UOP. -Continue home dapagliflozin  10 mg daily.  -Further management of respiratory failure/ aspiration PNA as per primary team.  -CIWA protocol given hx of alcohol  abuse. -Originally scheduled to see Dr. Katina 7/30 to discuss aortic valve repair/replacement options, this has been rescheduled to 09/03 at [facility].  # Demand ischemia # Coronary artery disease s/p CABG x3 (04/2012) s/p recent stent (07/02/24) # Hypertension # Hyperlipidemia With recent LHC, s/p DES to SVG to PDA 07/02/2024. Reported some CP when he first came to hospital which has now resolved, no recurrence. Troponin trended 44 > 144 > 205 > 980 > 1862 > 5766 > 5756. Patient weaned off levo on 07/31, BP improving and stabilizing. Reports chest tightness with deep breathing, MSK tenderness on palpation, trops repeated (08/07), minimally elevated and flat 72 > 75 is demand ischemia, not ACS is setting of stated above hospital concerns. Repeat EKG (08/07) is without acute ischemic changes.   -Continue home plavix  75 mg daily. Continue aspirin  81 mg daily. Continue DAPT due to recent stent placement on 07/16. -Continue Crestor  20 mg daily for high-intensity statin management.  -Continue metoprolol  succinate as stated above. -Will plan to resume home losartan , Imdur  as BP allows.  -Recommend MAP > 65.   # Acute on chronic anemia # Hx AVM Patient received blood transfusion on 07/28, 07/31, 08/04. -Closely monitor H&H. -Continue PPIs. -Goal Hgb >8 due to cardiac history. Recommend transfusion when <8.  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 Continue DAPT due to recent stent placement.  -Management per primary team.   Patient is pending SNF placement,  bed accepted, awaiting insurance. No further cardiac recommendations at this time. Cardiology will sign off. Please haiku with questions or re-engage if needed.   This patient's  plan of care was discussed and created with Dr. Ammon and he is in agreement.  Signed: Dorene Comfort, PA-C  07/31/2024, 9:56 AM [facility]

## 2024-08-12 ENCOUNTER — Inpatient Hospital Stay
Admission: EM | Admit: 2024-08-12 | Discharge: 2024-08-22 | DRG: 811 | Disposition: A | Discharge status: Home Health | Source: Skilled Nursing Facility

## 2024-08-12 ENCOUNTER — Emergency Department

## 2024-08-12 DIAGNOSIS — I350 Nonrheumatic aortic (valve) stenosis: Secondary | ICD-10-CM | POA: Diagnosis present

## 2024-08-12 DIAGNOSIS — Z8679 Personal history of other diseases of the circulatory system: Secondary | ICD-10-CM

## 2024-08-12 DIAGNOSIS — K5521 Angiodysplasia of colon with hemorrhage: Secondary | ICD-10-CM | POA: Diagnosis present

## 2024-08-12 DIAGNOSIS — N179 Acute kidney failure, unspecified: Secondary | ICD-10-CM | POA: Diagnosis present

## 2024-08-12 DIAGNOSIS — I5033 Acute on chronic diastolic (congestive) heart failure: Secondary | ICD-10-CM | POA: Diagnosis present

## 2024-08-12 DIAGNOSIS — Z8701 Personal history of pneumonia (recurrent): Secondary | ICD-10-CM

## 2024-08-12 DIAGNOSIS — I21A1 Myocardial infarction type 2: Secondary | ICD-10-CM | POA: Diagnosis present

## 2024-08-12 DIAGNOSIS — R079 Chest pain, unspecified: Secondary | ICD-10-CM | POA: Diagnosis not present

## 2024-08-12 DIAGNOSIS — N1831 Chronic kidney disease, stage 3a: Secondary | ICD-10-CM | POA: Diagnosis present

## 2024-08-12 DIAGNOSIS — U071 COVID-19: Secondary | ICD-10-CM | POA: Diagnosis present

## 2024-08-12 DIAGNOSIS — Z7951 Long term (current) use of inhaled steroids: Secondary | ICD-10-CM | POA: Diagnosis not present

## 2024-08-12 DIAGNOSIS — I5043 Acute on chronic combined systolic (congestive) and diastolic (congestive) heart failure: Secondary | ICD-10-CM | POA: Diagnosis not present

## 2024-08-12 DIAGNOSIS — E669 Obesity, unspecified: Secondary | ICD-10-CM | POA: Diagnosis present

## 2024-08-12 DIAGNOSIS — Z9981 Dependence on supplemental oxygen: Secondary | ICD-10-CM

## 2024-08-12 DIAGNOSIS — J441 Chronic obstructive pulmonary disease with (acute) exacerbation: Secondary | ICD-10-CM | POA: Diagnosis not present

## 2024-08-12 DIAGNOSIS — F0390 Unspecified dementia without behavioral disturbance: Secondary | ICD-10-CM | POA: Diagnosis present

## 2024-08-12 DIAGNOSIS — Z951 Presence of aortocoronary bypass graft: Secondary | ICD-10-CM

## 2024-08-12 DIAGNOSIS — I708 Atherosclerosis of other arteries: Secondary | ICD-10-CM | POA: Diagnosis present

## 2024-08-12 DIAGNOSIS — I493 Ventricular premature depolarization: Secondary | ICD-10-CM | POA: Diagnosis present

## 2024-08-12 DIAGNOSIS — E871 Hypo-osmolality and hyponatremia: Secondary | ICD-10-CM | POA: Diagnosis present

## 2024-08-12 DIAGNOSIS — E785 Hyperlipidemia, unspecified: Secondary | ICD-10-CM | POA: Diagnosis present

## 2024-08-12 DIAGNOSIS — D62 Acute posthemorrhagic anemia: Secondary | ICD-10-CM | POA: Diagnosis present

## 2024-08-12 DIAGNOSIS — Z5971 Insufficient health insurance coverage: Secondary | ICD-10-CM

## 2024-08-12 DIAGNOSIS — I130 Hypertensive heart and chronic kidney disease with heart failure and stage 1 through stage 4 chronic kidney disease, or unspecified chronic kidney disease: Secondary | ICD-10-CM | POA: Diagnosis present

## 2024-08-12 DIAGNOSIS — I2510 Atherosclerotic heart disease of native coronary artery without angina pectoris: Secondary | ICD-10-CM | POA: Diagnosis present

## 2024-08-12 DIAGNOSIS — K649 Unspecified hemorrhoids: Secondary | ICD-10-CM | POA: Diagnosis present

## 2024-08-12 DIAGNOSIS — Z7902 Long term (current) use of antithrombotics/antiplatelets: Secondary | ICD-10-CM

## 2024-08-12 DIAGNOSIS — Z6830 Body mass index (BMI) 30.0-30.9, adult: Secondary | ICD-10-CM

## 2024-08-12 DIAGNOSIS — I5031 Acute diastolic (congestive) heart failure: Secondary | ICD-10-CM

## 2024-08-12 DIAGNOSIS — R0602 Shortness of breath: Secondary | ICD-10-CM | POA: Diagnosis not present

## 2024-08-12 DIAGNOSIS — I214 Non-ST elevation (NSTEMI) myocardial infarction: Secondary | ICD-10-CM

## 2024-08-12 DIAGNOSIS — C3411 Malignant neoplasm of upper lobe, right bronchus or lung: Secondary | ICD-10-CM | POA: Diagnosis present

## 2024-08-12 DIAGNOSIS — K922 Gastrointestinal hemorrhage, unspecified: Secondary | ICD-10-CM | POA: Diagnosis not present

## 2024-08-12 DIAGNOSIS — E66811 Obesity, class 1: Secondary | ICD-10-CM | POA: Diagnosis present

## 2024-08-12 DIAGNOSIS — Z8674 Personal history of sudden cardiac arrest: Secondary | ICD-10-CM

## 2024-08-12 DIAGNOSIS — R319 Hematuria, unspecified: Secondary | ICD-10-CM | POA: Diagnosis present

## 2024-08-12 DIAGNOSIS — I252 Old myocardial infarction: Secondary | ICD-10-CM

## 2024-08-12 DIAGNOSIS — K31811 Angiodysplasia of stomach and duodenum with bleeding: Secondary | ICD-10-CM | POA: Diagnosis not present

## 2024-08-12 DIAGNOSIS — Z955 Presence of coronary angioplasty implant and graft: Secondary | ICD-10-CM

## 2024-08-12 DIAGNOSIS — Z515 Encounter for palliative care: Secondary | ICD-10-CM | POA: Diagnosis not present

## 2024-08-12 DIAGNOSIS — R578 Other shock: Secondary | ICD-10-CM | POA: Diagnosis not present

## 2024-08-12 DIAGNOSIS — J9621 Acute and chronic respiratory failure with hypoxia: Secondary | ICD-10-CM | POA: Diagnosis present

## 2024-08-12 DIAGNOSIS — R7989 Other specified abnormal findings of blood chemistry: Secondary | ICD-10-CM | POA: Diagnosis present

## 2024-08-12 DIAGNOSIS — Z8673 Personal history of transient ischemic attack (TIA), and cerebral infarction without residual deficits: Secondary | ICD-10-CM

## 2024-08-12 DIAGNOSIS — Z79899 Other long term (current) drug therapy: Secondary | ICD-10-CM

## 2024-08-12 DIAGNOSIS — Z602 Problems related to living alone: Secondary | ICD-10-CM | POA: Diagnosis present

## 2024-08-12 DIAGNOSIS — Z604 Social exclusion and rejection: Secondary | ICD-10-CM | POA: Diagnosis present

## 2024-08-12 DIAGNOSIS — Z720 Tobacco use: Secondary | ICD-10-CM

## 2024-08-12 DIAGNOSIS — Z888 Allergy status to other drugs, medicaments and biological substances status: Secondary | ICD-10-CM

## 2024-08-12 DIAGNOSIS — Z7982 Long term (current) use of aspirin: Secondary | ICD-10-CM | POA: Diagnosis not present

## 2024-08-12 LAB — CBC WITH DIFFERENTIAL/PLATELET
Abs Granulocyte: 9.6 K/uL — ABNORMAL HIGH (ref 1.5–6.5)
Abs Granulocyte: 10.5 K/uL — ABNORMAL HIGH (ref 1.5–6.5)
Abs Immature Granulocytes: 0.06 K/uL (ref 0.00–0.07)
Abs Immature Granulocytes: 0.05 K/uL (ref 0.00–0.07)
Basophils Absolute: 0 K/uL (ref 0.0–0.1)
Basophils Absolute: 0.1 K/uL (ref 0.0–0.1)
Basophils Relative: 0 %
Basophils Relative: 1 %
Eosinophils Absolute: 0.1 K/uL (ref 0.0–0.5)
Eosinophils Absolute: 0.1 K/uL (ref 0.0–0.5)
Eosinophils Relative: 1 %
Eosinophils Relative: 1 %
HCT: 15.8 % — ABNORMAL LOW (ref 39.0–52.0)
HCT: 28.4 % — ABNORMAL LOW (ref 39.0–52.0)
Hemoglobin: 4.7 g/dL — CL (ref 13.0–17.0)
Hemoglobin: 9.3 g/dL — ABNORMAL LOW (ref 13.0–17.0)
Immature Granulocytes: 1 %
Immature Granulocytes: 0 %
Lymphocytes Relative: 9 %
Lymphocytes Relative: 12 %
Lymphs Abs: 1.1 K/uL (ref 0.7–4.0)
Lymphs Abs: 1.7 K/uL (ref 0.7–4.0)
MCH: 27.8 pg (ref 26.0–34.0)
MCH: 28.6 pg (ref 26.0–34.0)
MCHC: 29.7 g/dL — ABNORMAL LOW (ref 30.0–36.0)
MCHC: 32.7 g/dL (ref 30.0–36.0)
MCV: 93.5 fL (ref 80.0–100.0)
MCV: 87.4 fL (ref 80.0–100.0)
Monocytes Absolute: 1.3 K/uL — ABNORMAL HIGH (ref 0.1–1.0)
Monocytes Absolute: 2 K/uL — ABNORMAL HIGH (ref 0.1–1.0)
Monocytes Relative: 11 %
Monocytes Relative: 14 %
Neutro Abs: 9.6 K/uL — ABNORMAL HIGH (ref 1.7–7.7)
Neutro Abs: 10.5 K/uL — ABNORMAL HIGH (ref 1.7–7.7)
Neutrophils Relative %: 78 %
Neutrophils Relative %: 72 %
Platelets: 330 K/uL (ref 150–400)
Platelets: 307 K/uL (ref 150–400)
RBC: 1.69 MIL/uL — ABNORMAL LOW (ref 4.22–5.81)
RBC: 3.25 MIL/uL — ABNORMAL LOW (ref 4.22–5.81)
RDW: 17.2 % — ABNORMAL HIGH (ref 11.5–15.5)
RDW: 15.9 % — ABNORMAL HIGH (ref 11.5–15.5)
WBC: 12.2 K/uL — ABNORMAL HIGH (ref 4.0–10.5)
WBC: 14.5 K/uL — ABNORMAL HIGH (ref 4.0–10.5)
nRBC: 0 % (ref 0.0–0.2)
nRBC: 0 % (ref 0.0–0.2)

## 2024-08-12 LAB — BRAIN NATRIURETIC PEPTIDE: B Natriuretic Peptide: 1514.9 pg/mL — ABNORMAL HIGH (ref 0.0–100.0)

## 2024-08-12 LAB — TROPONIN I (HIGH SENSITIVITY)
Troponin I (High Sensitivity): 888 ng/L (ref <18)
Troponin I (High Sensitivity): 793 ng/L (ref <18)

## 2024-08-12 LAB — COMPREHENSIVE METABOLIC PANEL WITH GFR
ALT: 14 U/L (ref 0–44)
AST: 31 U/L (ref 15–41)
Albumin: 3.1 g/dL — ABNORMAL LOW (ref 3.5–5.0)
Alkaline Phosphatase: 49 U/L (ref 38–126)
Anion gap: 13 (ref 5–15)
BUN: 33 mg/dL — ABNORMAL HIGH (ref 8–23)
CO2: 26 mmol/L (ref 22–32)
Calcium: 8.4 mg/dL — ABNORMAL LOW (ref 8.9–10.3)
Chloride: 94 mmol/L — ABNORMAL LOW (ref 98–111)
Creatinine, Ser: 1.68 mg/dL — ABNORMAL HIGH (ref 0.61–1.24)
GFR, Estimated: 45 mL/min — ABNORMAL LOW (ref >60)
Glucose, Bld: 133 mg/dL — ABNORMAL HIGH (ref 70–99)
Potassium: 3.7 mmol/L (ref 3.5–5.1)
Sodium: 133 mmol/L — ABNORMAL LOW (ref 135–145)
Total Bilirubin: 1 mg/dL (ref 0.0–1.2)
Total Protein: 6.9 g/dL (ref 6.5–8.1)

## 2024-08-12 LAB — LACTIC ACID, PLASMA
Lactic Acid, Venous: 3.2 mmol/L (ref 0.5–1.9)
Lactic Acid, Venous: 2.4 mmol/L (ref 0.5–1.9)

## 2024-08-12 LAB — PREPARE RBC (CROSSMATCH)

## 2024-08-12 LAB — PROTIME-INR
INR: 1.2 (ref 0.8–1.2)
Prothrombin Time: 15.8 s — ABNORMAL HIGH (ref 11.4–15.2)

## 2024-08-12 LAB — LIPASE, BLOOD: Lipase: 33 U/L (ref 11–51)

## 2024-08-12 LAB — GLUCOSE, CAPILLARY: Glucose-Capillary: 114 mg/dL — ABNORMAL HIGH (ref 70–99)

## 2024-08-12 MED ORDER — DOCUSATE SODIUM 100 MG PO CAPS: 100.0000 mg | ORAL_CAPSULE | Freq: Two times a day (BID) | ORAL | Status: DC | PRN

## 2024-08-12 MED ORDER — POLYETHYLENE GLYCOL 3350 17 G PO PACK: 17.0000 g | PACK | Freq: Every day | ORAL | Status: DC | PRN

## 2024-08-12 MED ORDER — THIAMINE MONONITRATE 100 MG PO TABS
100.0000 mg | ORAL_TABLET | Freq: Every day | ORAL | Status: DC
  Administered 2024-08-13 – 2024-08-22 (×10): 100 mg via ORAL
  Filled 2024-08-12 (×10): qty 1

## 2024-08-12 MED ORDER — IOHEXOL 350 MG/ML SOLN
100.0000 mL | Freq: Once | INTRAVENOUS | Status: AC | PRN
  Administered 2024-08-12: 100 mL via INTRAVENOUS

## 2024-08-12 MED ORDER — IPRATROPIUM-ALBUTEROL 0.5-2.5 (3) MG/3ML IN SOLN
3.0000 mL | Freq: Three times a day (TID) | RESPIRATORY_TRACT | Status: DC
  Administered 2024-08-12: 3 mL via RESPIRATORY_TRACT
  Filled 2024-08-12: qty 3

## 2024-08-12 MED ORDER — PANTOPRAZOLE SODIUM 40 MG IV SOLR
80.0000 mg | Freq: Once | INTRAVENOUS | Status: AC
  Administered 2024-08-12: 80 mg via INTRAVENOUS
  Filled 2024-08-12: qty 20

## 2024-08-12 MED ORDER — DM-GUAIFENESIN ER 30-600 MG PO TB12
1.0000 | ORAL_TABLET | Freq: Two times a day (BID) | ORAL | Status: DC | PRN
  Administered 2024-08-17 – 2024-08-18 (×4): 1 via ORAL
  Filled 2024-08-12 (×5): qty 1

## 2024-08-12 MED ORDER — SODIUM CHLORIDE 0.9 % IV BOLUS
1000.0000 mL | Freq: Once | INTRAVENOUS | Status: AC
  Administered 2024-08-12: 1000 mL via INTRAVENOUS

## 2024-08-12 MED ORDER — BUDESONIDE 0.5 MG/2ML IN SUSP
0.5000 mg | Freq: Two times a day (BID) | RESPIRATORY_TRACT | Status: DC
  Administered 2024-08-13 – 2024-08-14 (×3): 0.5 mg via RESPIRATORY_TRACT
  Filled 2024-08-12 (×3): qty 2

## 2024-08-12 MED ORDER — SODIUM CHLORIDE 0.9% IV SOLUTION
Freq: Once | INTRAVENOUS | Status: DC
  Filled 2024-08-12: qty 250

## 2024-08-12 MED ORDER — IPRATROPIUM-ALBUTEROL 0.5-2.5 (3) MG/3ML IN SOLN
3.0000 mL | Freq: Three times a day (TID) | RESPIRATORY_TRACT | Status: DC
  Administered 2024-08-13: 3 mL via RESPIRATORY_TRACT
  Filled 2024-08-12: qty 3

## 2024-08-12 MED ORDER — MELATONIN 5 MG PO TABS
5.0000 mg | ORAL_TABLET | Freq: Every evening | ORAL | Status: DC | PRN
  Administered 2024-08-13: 5 mg
  Filled 2024-08-12: qty 1

## 2024-08-12 MED ORDER — NOREPINEPHRINE 4 MG/250ML-% IV SOLN: 0.0000 ug/min | INTRAVENOUS | Status: DC

## 2024-08-12 MED ORDER — CHLORHEXIDINE GLUCONATE CLOTH 2 % EX PADS
6.0000 | MEDICATED_PAD | Freq: Every day | CUTANEOUS | Status: DC
  Administered 2024-08-12 – 2024-08-14 (×3): 6 via TOPICAL
  Filled 2024-08-12: qty 6

## 2024-08-12 MED ORDER — FOLIC ACID 1 MG PO TABS
1.0000 mg | ORAL_TABLET | Freq: Every day | ORAL | Status: DC
  Administered 2024-08-13 – 2024-08-14 (×2): 1 mg via ORAL
  Filled 2024-08-12 (×2): qty 1

## 2024-08-12 MED ORDER — SODIUM CHLORIDE 0.9 % IV SOLN: 250.0000 mL | INTRAVENOUS | Status: AC

## 2024-08-12 MED ORDER — NICOTINE 7 MG/24HR TD PT24
7.0000 mg | MEDICATED_PATCH | Freq: Every day | TRANSDERMAL | Status: DC
  Administered 2024-08-13 – 2024-08-22 (×10): 7 mg via TRANSDERMAL
  Filled 2024-08-12 (×11): qty 1

## 2024-08-12 MED ORDER — BUDESONIDE 0.5 MG/2ML IN SUSP
0.5000 mg | Freq: Two times a day (BID) | RESPIRATORY_TRACT | Status: DC
  Administered 2024-08-12: 0.5 mg via RESPIRATORY_TRACT
  Filled 2024-08-12: qty 2

## 2024-08-12 MED ORDER — SODIUM CHLORIDE 0.9 % IV SOLN: 10.0000 mL/h | Freq: Once | INTRAVENOUS | Status: DC

## 2024-08-12 MED ORDER — ADULT MULTIVITAMIN W/MINERALS CH
1.0000 | ORAL_TABLET | Freq: Every day | ORAL | Status: DC
  Administered 2024-08-13 – 2024-08-22 (×10): 1 via ORAL
  Filled 2024-08-12 (×10): qty 1

## 2024-08-12 MED ORDER — ALBUTEROL SULFATE (2.5 MG/3ML) 0.083% IN NEBU: 2.5000 mg | INHALATION_SOLUTION | Freq: Four times a day (QID) | RESPIRATORY_TRACT | Status: DC | PRN

## 2024-08-12 MED ORDER — PANTOPRAZOLE SODIUM 40 MG IV SOLR
40.0000 mg | Freq: Two times a day (BID) | INTRAVENOUS | Status: DC
  Administered 2024-08-12 – 2024-08-22 (×20): 40 mg via INTRAVENOUS
  Filled 2024-08-12 (×20): qty 10

## 2024-08-12 NOTE — ED Notes (Signed)
 Attempted to call and give report to ICU. Unable to take report at this time

## 2024-08-12 NOTE — ED Notes (Signed)
 Blood started at 315mL per hour per MD's VO to infuse 3rd and 4th units over one hour

## 2024-08-12 NOTE — H&P (Signed)
 NAME:  [name], MRN:  [id number], DOB:  04-03-1961, LOS: 0 ADMISSION DATE:  08/12/2024, CONSULTATION DATE: 08/12/2024 REFERRING MD: Dr. Clarine, CHIEF COMPLAINT: Syncopal Episode    History of Present Illness:  This is a 63 yo male who presented to [facility] ER on 08/26 from [facility] via EMS following a syncopal episode.  It was reported pt has had blood stools for the past month.   He was recently discharged from [facility] following a prolonged hospitalization 07/26-08/14 following treatment of ETOH withdrawal requiring mechanical intubation due to worsening acute respiratory failure secondary to pneumonia due to recurrent aspiration along with acute on chronic diastolic CHF and cardiac arrest.  He was discharged to [facility].  ED Course Upon arrival to the ER pt hypotensive sbp 74 to 80's.  Significant lab results were: Na+ 133/glucose 133/BUN 33/creatine 1.68/calcium  8.4/albumin  3.1/BNP 1,514.0/troponin 888/lactic acid 3.2/wbc 12.2/hgb 4.7.  Pt has a hx of AVM's undergone multiple bidrectional endoscopies without findings of bleeding within the last 2 yrs.  Pt has a hx of CXR concerning for mild pulmonary vascular congestion and small right pleural effusion.  Pt pending 2 units of PRBC's and 1 unit of platelets.  EDP consulted GI.  PCCM team contacted for ICU admission.    Pertinent  Medical History  CAD s/p CABG HFpEF Severe Aortic Stenosis ETOH use COPD on Trelegy  Significant Hospital Events: Including procedures, antibiotic start and stop dates in addition to other pertinent events   08/26: Pt admitted with acute kidney injury and hemorrhagic shock secondary to acute GI bleed   Interim History / Subjective:  Pt resting comfortably in bed receiving 1 unit of pRBC's map >65 not requiring vasopressors at this time.  Currently c/o productive cough and chest congestion    Objective    Blood pressure (!) 95/56, pulse 86, temperature 98.4 F (36.9 C), temperature source Oral,  resp. rate 18, height 6' (1.829 m), weight 97.9 kg, SpO2 98%.        Intake/Output Summary (Last 24 hours) at 08/12/2024 1702 Last data filed at 08/12/2024 1635 Gross per 24 hour  Intake 630 ml  Output --  Net 630 ml   Filed Weights   08/12/24 1454  Weight: 97.9 kg    Examination: General: Acutely-ill appearing male, NAD on 2L O2 via nasal canula  HENT: Supple, no JVD Lungs: Faint rhonchi throughout, even, non labored  Cardiovascular: NSR, s1s2, no r/g, 2+ radial/1+ distal pulses, no edema  Abdomen: +BS x4, obese, soft, non tender, non distended  Extremities: Normal bulk and tone, moves all extremities  Neuro: Alert and oriented, follows commands, PERRLA  GU: Deferred   Resolved problem list   Assessment and Plan   #Severe aortic stenosis  #Elevated troponin's secondary to NSTEMI vs. demand ischemia  #Acute HFpEF  Hx: HTN, MI, CAD, and stroke  - Continuous telemetry monitoring  - Troponin peaked at 888 - Prn blood products and/or levophed  gtt to maintain map >65 - Hold outpatient antihypertensives  - Hold outpatient plavix  and aspirin    #Acute on chronic hypoxic respiratory failure  #COPD - Supplemental O2 for dysnea and/or hypoxia  - Maintain O2 sats 88% to 92% - Scheduled and prn bronchodilator therapy  - Aggressive pulmonary hygiene - Continue nicotine  patch  - Respiratory viral panel and COVID/Flu A&B/RSV   #Acute kidney injury  #Hyponatremia  #Lactic acidois  - Trend BMP and lactic acid  - Replace electrolytes as indicated  - Strict I&O's  -  Avoid nephrotoxic agents   #Acute GI bleed  Hx: AVM's undergone multiple bidrectional endoscopies without findings of bleeding within the last 2 yrs  - Trend CBC  - Monitor for s/sx of bleeding  - Transfuse for hgb <8  - GI consulted appreciate input  - PPI q12hrs   #Endo - CBGs q4hrs  - Follow hyper/hypoglycemic protocol  - Target CBG readings 140 to 180  Best Practice (right click and Reselect all  SmartList Selections daily)   Diet/type: clear liquids DVT prophylaxis SCD Pressure ulcer(s): N/A GI prophylaxis: PPI Lines: N/A Foley:  N/A Code Status:  full code Last date of multidisciplinary goals of care discussion [N/A]  Updated pt at bedside regarding current plan of care all questions answered  Labs   CBC: Recent Labs  Lab 08/12/24 1457  WBC 12.2*  NEUTROABS 9.6*  HGB 4.7*  HCT 15.8*  MCV 93.5  PLT 330    Basic Metabolic Panel: Recent Labs  Lab 08/12/24 1457  NA 133*  K 3.7  CL 94*  CO2 26  GLUCOSE 133*  BUN 33*  CREATININE 1.68*  CALCIUM  8.4*   GFR: Estimated Creatinine Clearance: 53.1 mL/min (A) (by C-G formula based on SCr of 1.68 mg/dL (H)). Recent Labs  Lab 08/12/24 1457  WBC 12.2*  LATICACIDVEN 3.2*    Liver Function Tests: Recent Labs  Lab 08/12/24 1457  AST 31  ALT 14  ALKPHOS 49  BILITOT 1.0  PROT 6.9  ALBUMIN  3.1*   Recent Labs  Lab 08/12/24 1457  LIPASE 33   No results for input(s): AMMONIA in the last 168 hours.  ABG    Component Value Date/Time   PHART 7.47 (H) 07/18/2024 0347   PCO2ART 43 07/18/2024 0347   PO2ART 86 07/18/2024 0347   HCO3 31.3 (H) 07/18/2024 0347   ACIDBASEDEF 0.8 07/16/2024 0915   O2SAT 98.2 07/18/2024 0347     Coagulation Profile: Recent Labs  Lab 08/12/24 1457  INR 1.2    Cardiac Enzymes: No results for input(s): CKTOTAL, CKMB, CKMBINDEX, TROPONINI in the last 168 hours.  HbA1C: Hemoglobin A1C  Date/Time Value Ref Range Status  12/02/2013 03:52 PM 5.3 4.2 - 6.3 % Final    Comment:    The American Diabetes Association recommends that a primary goal of therapy should be <7% and that physicians should reevaluate the treatment regimen in patients with HbA1c values consistently >8%.    Hgb A1c MFr Bld  Date/Time Value Ref Range Status  10/28/2022 04:37 AM 5.3 4.8 - 5.6 % Final    Comment:    (NOTE) Pre diabetes:          5.7%-6.4%  Diabetes:               >6.4%  Glycemic control for   <7.0% adults with diabetes     CBG: No results for input(s): GLUCAP in the last 168 hours.  Review of Systems: Positives in BOLD   Gen: Denies fever, chills, weight change, fatigue, night sweats HEENT: Denies blurred vision, double vision, hearing loss, tinnitus, sinus congestion, rhinorrhea, sore throat, neck stiffness, dysphagia PULM: shortness of breath, cough, sputum production, hemoptysis, wheezing CV: Denies chest pain, edema, orthopnea, paroxysmal nocturnal dyspnea, palpitations GI: abdominal pain, nausea, vomiting, diarrhea, hematochezia, melena, constipation, change in bowel habits GU: Denies dysuria, hematuria, polyuria, oliguria, urethral discharge Endocrine: Denies hot or cold intolerance, polyuria, polyphagia or appetite change Derm: Denies rash, dry skin, scaling or peeling skin change Heme: Denies easy bruising, bleeding, bleeding gums  Neuro: Denies headache, numbness, weakness, slurred speech, loss of memory or consciousness  Past Medical History:  He,  has a past medical history of COPD (chronic obstructive pulmonary disease) (HCC), CVA (cerebral infarction), Headache, Hypertension, MI (myocardial infarction) (HCC) (05/09/2012), Reading difficulty, Stroke [facility]) (2012), and Wears dentures.   Surgical History:   Past Surgical History:  Procedure Laterality Date   CAROTID ENDARTERECTOMY Left    2012 or 2013   CHOLECYSTECTOMY N/A 07/26/2016   Procedure: LAPAROSCOPIC CHOLECYSTECTOMY;  Surgeon: Charlie FORBES Fell, MD;  Location: [facility] ORS;  Service: General;  Laterality: N/A;   COLONOSCOPY N/A 05/08/2024   Procedure: COLONOSCOPY;  Surgeon: Jinny Carmine, MD;  Location: [facility] ENDOSCOPY;  Service: Endoscopy;  Laterality: N/A;   COLONOSCOPY WITH PROPOFOL  N/A 02/09/2022   Procedure: COLONOSCOPY WITH PROPOFOL ;  Surgeon: Jinny Carmine, MD;  Location[facility] ENDOSCOPY;  Service: Endoscopy;  Laterality: N/A;   CORONARY ARTERY BYPASS GRAFT  05/10/2012   3  vessel   CORONARY STENT INTERVENTION N/A 07/02/2024   Procedure: CORONARY STENT INTERVENTION;  Surgeon: Katina Albright, MD;  Location: [facility] INVASIVE CV LAB;  Service: Cardiovascular;  Laterality: N/A;   CORONARY ULTRASOUND/IVUS N/A 07/02/2024   Procedure: Coronary Ultrasound/IVUS;  Surgeon: Katina Albright, MD;  Location: [facility] INVASIVE CV LAB;  Service: Cardiovascular;  Laterality: N/A;   ENTEROSCOPY N/A 05/16/2023   Procedure: ENTEROSCOPY;  Surgeon: Therisa Bi, MD;  Location: [facility] ENDOSCOPY;  Service: Gastroenterology;  Laterality: N/A;   ENTEROSCOPY N/A 06/01/2023   Procedure: ENTEROSCOPY;  Surgeon: Unk Corinn Skiff, MD;  Location: [facility] ENDOSCOPY;  Service: Gastroenterology;  Laterality: N/A;   ESOPHAGOGASTRODUODENOSCOPY  05/13/2023   Procedure: ESOPHAGOGASTRODUODENOSCOPY (EGD);  Surgeon: Jinny Carmine, MD;  Location: [facility] ENDOSCOPY;  Service: Endoscopy;;   ESOPHAGOGASTRODUODENOSCOPY (EGD) WITH PROPOFOL  N/A 10/28/2022   Procedure: ESOPHAGOGASTRODUODENOSCOPY (EGD) WITH PROPOFOL ;  Surgeon: Onita Elspeth Sharper, DO;  Location: [facility] ENDOSCOPY;  Service: Gastroenterology;  Laterality: N/A;   GIVENS CAPSULE STUDY  05/13/2023   Procedure: GIVENS CAPSULE STUDY;  Surgeon: Jinny Carmine, MD;  Location: [facility] ENDOSCOPY;  Service: Endoscopy;;   GIVENS CAPSULE STUDY  06/01/2023   Procedure: GIVENS CAPSULE STUDY;  Surgeon: Unk Corinn Skiff, MD;  Location: [facility] ENDOSCOPY;  Service: Gastroenterology;;   LEFT HEART CATH AND CORS/GRAFTS ANGIOGRAPHY N/A 07/02/2024   Procedure: LEFT HEART CATH AND CORS/GRAFTS ANGIOGRAPHY;  Surgeon: Katina Albright, MD;  Location: [facility] INVASIVE CV LAB;  Service: Cardiovascular;  Laterality: N/A;     Social History:   reports that he quit smoking about 32 years ago. His smoking use included cigarettes. His smokeless tobacco use includes snuff. He reports current alcohol  use of about 26.0 standard drinks of alcohol  per week. He reports that he does not use drugs.   Family History:  His family  history includes Pneumonia in his mother.   Allergies Allergies  Allergen Reactions   Wellbutrin  [Bupropion ]     Paranoia      Home Medications  Prior to Admission medications   Medication Sig Start Date End Date Taking? Authorizing Provider  albuterol  (VENTOLIN  HFA) 108 (90 Base) MCG/ACT inhaler Inhale 2 puffs into the lungs every 6 (six) hours as needed for wheezing or shortness of breath. 07/03/24   Von Bellis, MD  amiodarone  (PACERONE ) 200 MG tablet Take 1 tablet (200 mg total) by mouth daily. 08/01/24   Amin, Sumayya, MD  ascorbic acid  (VITAMIN C ) 500 MG tablet Take 1 tablet (500 mg total) by mouth daily. 07/03/24 10/01/24  Von Bellis, MD  aspirin  EC 81 MG tablet Take 1 tablet (  81 mg total) by mouth daily. Swallow whole. 07/03/24 12/30/24  Von Bellis, MD  budesonide  (PULMICORT ) 0.5 MG/2ML nebulizer solution Take 2 mLs (0.5 mg total) by nebulization 2 (two) times daily. 07/31/24   Caleen Qualia, MD  clopidogrel  (PLAVIX ) 75 MG tablet Take 1 tablet (75 mg total) by mouth daily with breakfast. 07/03/24   Von Bellis, MD  dapagliflozin  propanediol (FARXIGA ) 10 MG TABS tablet Take 1 tablet (10 mg total) by mouth daily. 07/03/24   Von Bellis, MD  dextromethorphan -guaiFENesin  (MUCINEX  DM) 30-600 MG 12hr tablet Take 1 tablet by mouth 2 (two) times daily as needed for cough. 07/31/24   Caleen Qualia, MD  feeding supplement (ENSURE PLUS HIGH PROTEIN) LIQD Take 237 mLs by mouth 3 (three) times daily between meals. 07/31/24   Caleen Qualia, MD  folic acid  (FOLVITE ) 1 MG tablet Take 1 tablet (1 mg total) by mouth daily. 07/03/24 10/01/24  Von Bellis, MD  Glycerin , Laxative, (GLYCERIN , ADULT,) 2 g SUPP Place 1 suppository rectally daily as needed for moderate constipation. 07/31/24   Caleen Qualia, MD  ipratropium-albuterol  (DUONEB) 0.5-2.5 (3) MG/3ML SOLN Take 3 mLs by nebulization 3 (three) times daily. 07/31/24   Caleen Qualia, MD  iron  polysaccharides (NIFEREX) 150 MG capsule Take 1 capsule  (150 mg total) by mouth daily. 07/03/24 10/01/24  Von Bellis, MD  isosorbide  mononitrate (IMDUR ) 30 MG 24 hr tablet Take 1 tablet (30 mg total) by mouth daily. 07/31/24 07/31/25  Caleen Qualia, MD  lactulose  (CHRONULAC ) 10 GM/15ML solution Take 30 mLs (20 g total) by mouth daily as needed for moderate constipation or mild constipation. 07/31/24   Amin, Sumayya, MD  losartan  (COZAAR ) 25 MG tablet Take 0.5 tablets (12.5 mg total) by mouth daily. 07/04/24 07/04/25  Von Bellis, MD  melatonin 5 MG TABS Place 1 tablet (5 mg total) into feeding tube at bedtime as needed. 07/31/24   Amin, Sumayya, MD  metoprolol  succinate (TOPROL -XL) 25 MG 24 hr tablet Take 0.5 tablets (12.5 mg total) by mouth daily. 07/03/24   Von Bellis, MD  Multiple Vitamin (MULTIVITAMIN WITH MINERALS) TABS tablet Take 1 tablet by mouth daily. 08/01/24   Amin, Sumayya, MD  nicotine  (NICODERM CQ  - DOSED IN MG/24 HR) 7 mg/24hr patch Place 1 patch (7 mg total) onto the skin daily. 08/01/24   Amin, Sumayya, MD  pantoprazole  (PROTONIX ) 40 MG tablet Take 1 tablet (40 mg total) by mouth daily. 07/03/24 10/01/24  Von Bellis, MD  rosuvastatin  (CRESTOR ) 20 MG tablet Take 1 tablet (20 mg total) by mouth daily. 08/01/24   Amin, Sumayya, MD  senna-docusate (SENOKOT-S) 8.6-50 MG tablet Take 2 tablets by mouth at bedtime. 07/31/24   Amin, Sumayya, MD  sodium chloride  (OCEAN) 0.65 % SOLN nasal spray Place 1 spray into both nostrils as needed for congestion. 07/31/24   Amin, Sumayya, MD  thiamine  (VITAMIN B-1) 100 MG tablet Take 1 tablet (100 mg total) by mouth daily. 08/01/24   Amin, Sumayya, MD  torsemide  (DEMADEX ) 20 MG tablet Take 1 tablet (20 mg total) by mouth daily. 08/01/24   Caleen Qualia, MD  Vitamin D , Ergocalciferol , (DRISDOL ) 1.25 MG (50000 UNIT) CAPS capsule Take 1 capsule (50,000 Units total) by mouth every 7 (seven) days. 08/02/24   Caleen Qualia, MD     Critical care time: 77 minutes      Lonell Moose, [facility]  Pulmonary/Critical Care Pager  [phone] (please enter 7 digits) PCCM Consult Pager [phone] (please enter 7 digits)

## 2024-08-12 NOTE — ED Notes (Signed)
 Blood transfusion consent formed signed at this time prior to routine blood transfusion administration. Witnessed by this Charity fundraiser.

## 2024-08-12 NOTE — ED Provider Notes (Signed)
 [facility] Provider Note    Event Date/Time   First MD Initiated Contact with Patient 08/12/24 1504     (approximate)   History   Hypotension  Pt presents to the ED via [facility] from [facility]. Pt was getting ready to do PT and had a syncopal episode. Pt was here 1 month ago for PNA, was intubated, and had blood transfusions per EMS. Pt reports that he has been having bloody stools for the past month.   HR 92 100% on 2L (baseline O2) CBG 212 ETCO2 27 Temp 99.1  BP 87/54   HPI [name] is a 63 y.o. male PMH multiple medical comorbidities including COPD on 2 L nasal cannula, CAD with prior CABG and recent stent placement, HLD, stroke, dementia, diastolic CHF, prior GI bleed not on anticoagulation (was on dual antiplatelet therapy) presents for evaluation of reported syncopal episode - Patient is a limited historian but tells me he is feeling very weak and that they had rushed him to the bathroom when he had black stools. - Afebrile here - Denies any pain - States he did not have any syncopal episodes though did get lightheaded          Physical Exam   Triage Vital Signs: ED Triage Vitals  Encounter Vitals Group     BP 08/12/24 1456 (!) 85/61     Girls Systolic BP Percentile --      Girls Diastolic BP Percentile --      Boys Systolic BP Percentile --      Boys Diastolic BP Percentile --      Pulse Rate 08/12/24 1453 92     Resp 08/12/24 1453 18     Temp 08/12/24 1453 98.4 F (36.9 C)     Temp Source 08/12/24 1453 Oral     SpO2 08/12/24 1453 98 %     Weight 08/12/24 1454 215 lb 13.3 oz (97.9 kg)     Height 08/12/24 1454 6' (1.829 m)     Head Circumference --      Peak Flow --      Pain Score 08/12/24 1454 0     Pain Loc --      Pain Education --      Exclude from Growth Chart --     Most recent vital signs: Vitals:   08/12/24 2200 08/12/24 2300  BP: 105/73 107/79  Pulse: 89 91  Resp: 19 (!) 21  Temp:    SpO2: 100%  100%     General: Awake, no distress.  CV:  Good peripheral perfusion. RRR, RP 2+ Resp:  Normal effort. CTAB Abd:  No distention. Nontender to deep palpation throughout Rectal: + Melena, immediately Hemoccult positive   ED Results / Procedures / Treatments   Labs (all labs ordered are listed, but only abnormal results are displayed) Labs Reviewed  CBC WITH DIFFERENTIAL/PLATELET - Abnormal; Notable for the following components:      Result Value   WBC 12.2 (*)    RBC 1.69 (*)    Hemoglobin 4.7 (*)    HCT 15.8 (*)    MCHC 29.7 (*)    RDW 17.2 (*)    Neutro Abs 9.6 (*)    Monocytes Absolute 1.3 (*)    Abs Granulocyte 9.6 (*)    All other components within normal limits  COMPREHENSIVE METABOLIC PANEL WITH GFR - Abnormal; Notable for the following components:   Sodium 133 (*)    Chloride 94 (*)  Glucose, Bld 133 (*)    BUN 33 (*)    Creatinine, Ser 1.68 (*)    Calcium  8.4 (*)    Albumin  3.1 (*)    GFR, Estimated 45 (*)    All other components within normal limits  LACTIC ACID, PLASMA - Abnormal; Notable for the following components:   Lactic Acid, Venous 3.2 (*)    All other components within normal limits  LACTIC ACID, PLASMA - Abnormal; Notable for the following components:   Lactic Acid, Venous 2.4 (*)    All other components within normal limits  BRAIN NATRIURETIC PEPTIDE - Abnormal; Notable for the following components:   B Natriuretic Peptide 1,514.9 (*)    All other components within normal limits  PROTIME-INR - Abnormal; Notable for the following components:   Prothrombin Time 15.8 (*)    All other components within normal limits  CBC WITH DIFFERENTIAL/PLATELET - Abnormal; Notable for the following components:   WBC 14.5 (*)    RBC 3.25 (*)    Hemoglobin 9.3 (*)    HCT 28.4 (*)    RDW 15.9 (*)    Neutro Abs 10.5 (*)    Monocytes Absolute 2.0 (*)    Abs Granulocyte 10.5 (*)    All other components within normal limits  GLUCOSE, CAPILLARY - Abnormal;  Notable for the following components:   Glucose-Capillary 114 (*)    All other components within normal limits  TROPONIN I (HIGH SENSITIVITY) - Abnormal; Notable for the following components:   Troponin I (High Sensitivity) 888 (*)    All other components within normal limits  TROPONIN I (HIGH SENSITIVITY) - Abnormal; Notable for the following components:   Troponin I (High Sensitivity) 793 (*)    All other components within normal limits  CULTURE, BLOOD (ROUTINE X 2)  CULTURE, BLOOD (ROUTINE X 2)  MRSA NEXT GEN BY PCR, NASAL  RESP PANEL BY RT-PCR (RSV, FLU A&B, COVID)  RVPGX2  RESPIRATORY PANEL BY PCR  LIPASE, BLOOD  URINALYSIS, COMPLETE (UACMP) WITH MICROSCOPIC  CBC WITH DIFFERENTIAL/PLATELET  COMPREHENSIVE METABOLIC PANEL WITH GFR  MAGNESIUM   PHOSPHORUS  CBC WITH DIFFERENTIAL/PLATELET  CBC WITH DIFFERENTIAL/PLATELET  CBC WITH DIFFERENTIAL/PLATELET  TYPE AND SCREEN  PREPARE RBC (CROSSMATCH)  PREPARE RBC (CROSSMATCH)  PREPARE PLATELET PHERESIS     EKG  Ecg = sinus rhythm, rate 93, no gross ST elevation, some trace depressions throughout, normal axis, normal intervals.  Abnormal EKG.  No evidence of arrhythmia.   RADIOLOGY Radiology interpreted by myself and radiology report reviewed.  No GI bleed identified.    PROCEDURES:  Critical Care performed: Yes, see critical care procedure note(s)  .Critical Care  Performed by: Clarine Ozell LABOR, MD Authorized by: Clarine Ozell LABOR, MD   Critical care provider statement:    Critical care time (minutes):  90   Critical care time was exclusive of:  Separately billable procedures and treating other patients   Critical care was necessary to treat or prevent imminent or life-threatening deterioration of the following conditions:  Circulatory failure and shock   Critical care was time spent personally by me on the following activities:  Development of treatment plan with patient or surrogate, discussions with consultants, evaluation  of patient's response to treatment, examination of patient, ordering and review of laboratory studies, ordering and review of radiographic studies, ordering and performing treatments and interventions, pulse oximetry, re-evaluation of patient's condition and review of old charts   I assumed direction of critical care for this patient from another provider  in my specialty: no     Care discussed with: admitting provider      MEDICATIONS ORDERED IN ED: Medications  0.9 %  sodium chloride  infusion (0 mL/hr Intravenous Hold 08/12/24 1601)  0.9 %  sodium chloride  infusion (Manually program via Guardrails IV Fluids) (0 mLs Intravenous Hold 08/12/24 1600)  Chlorhexidine  Gluconate Cloth 2 % PADS 6 each (6 each Topical Given 08/12/24 1935)  docusate sodium  (COLACE) capsule 100 mg (has no administration in time range)  polyethylene glycol (MIRALAX  / GLYCOLAX ) packet 17 g (has no administration in time range)  pantoprazole  (PROTONIX ) injection 40 mg (40 mg Intravenous Given 08/12/24 2218)  albuterol  (PROVENTIL ) (2.5 MG/3ML) 0.083% nebulizer solution 2.5 mg (has no administration in time range)  dextromethorphan -guaiFENesin  (MUCINEX  DM) 30-600 MG per 12 hr tablet 1 tablet (has no administration in time range)  folic acid  (FOLVITE ) tablet 1 mg (has no administration in time range)  melatonin tablet 5 mg (has no administration in time range)  multivitamin with minerals tablet 1 tablet (has no administration in time range)  nicotine  (NICODERM CQ  - dosed in mg/24 hr) patch 7 mg (has no administration in time range)  thiamine  (VITAMIN B1) tablet 100 mg (has no administration in time range)  0.9 %  sodium chloride  infusion (0 mLs Intravenous Hold 08/12/24 2023)  norepinephrine  (LEVOPHED ) 4mg  in (0.016 mg/mL) premix infusion (0 mcg/min Intravenous Hold 08/12/24 2023)  budesonide  (PULMICORT ) nebulizer solution 0.5 mg (has no administration in time range)  ipratropium-albuterol  (DUONEB) 0.5-2.5 (3) MG/3ML  nebulizer solution 3 mL (has no administration in time range)  sodium chloride  0.9 % bolus 1,000 mL (0 mLs Intravenous Stopped 08/12/24 1747)  pantoprazole  (PROTONIX ) injection 80 mg (80 mg Intravenous Given 08/12/24 1520)  iohexol  (OMNIPAQUE ) 350 MG/ML injection 100 mL (100 mLs Intravenous Contrast Given 08/12/24 1540)     IMPRESSION / MDM / ASSESSMENT AND PLAN / ED COURSE  I reviewed the triage vital signs and the nursing notes.                              DDX/MDM/AP: Differential diagnosis includes, but is not limited to, hemorrhagic shock from likely upper GI bleed, highly doubt other source including sepsis at this time.  Suspect likely type II NSTEMI, doubt primary ACS.  Plan: - Fluid resuscitation until emergent blood arrives - Labs -N.p.o. - Anticipate GI consultation and admission - EKG  Patient's presentation is most consistent with acute presentation with potential threat to life or bodily function.  The patient is on the cardiac monitor to evaluate for evidence of arrhythmia and/or significant heart rate changes.  ED course below.  Hypotension improved with aggressive fluid and blood resuscitation, initially given 2 units of emergency blood.  Was then able to transition to crossmatched blood, further 2 units given in the emergency department.  Blood pressures did improve though remains somewhat soft, did not need to start pressors.  GI consulted, note patient's hemoglobin (4.7 today, baseline 8-9) is too low to perform endoscopy and that it would need to be at least 7.  IR reviewed imaging, no obvious extravasation appreciated, no indication for IR intervention.  Admitted to ICU with ongoing blood resuscitation.  Labs also notable for troponin elevation, suspect type II NSTEMI in the setting of hemorrhagic shock, downtrending on repeat.  Tolerated fluids well here in emergency department.  Repeat H&H at about 9 PM fortunately improved to 9.3.  Clinical Course as of 08/12/24  2354  Tue Aug 12, 2024  1556 Hemoglobin(!!): 4.7 ED secretary paging GI [MM]  1603 With Dr. Therisa GI, states hemoglobin would need to be at least 7 to take 2 OR for endoscopy.  Recommends discussion with IR  IR paged [MM]  1609 Spoke with IR, imaging reviewed No active extravasation seen on their review of imaging [MM]  1632 Troponin markedly elevated, suspect type II NSTEMI.  Will not initiate anticoagulation in this patient in hemorrhagic shock. [MM]    Clinical Course User Index [MM] Clarine Ozell LABOR, MD     FINAL CLINICAL IMPRESSION(S) / ED DIAGNOSES   Final diagnoses:  Hemorrhagic shock (HCC)  Gastrointestinal hemorrhage, unspecified gastrointestinal hemorrhage type  NSTEMI (non-ST elevated myocardial infarction) (HCC)     Rx / DC Orders   ED Discharge Orders     None        Note:  This document was prepared using Dragon voice recognition software and may include unintentional dictation errors.   Clarine Ozell LABOR, MD 08/12/24 [phone]

## 2024-08-12 NOTE — ED Notes (Signed)
 Called CCMD for central monitoring

## 2024-08-12 NOTE — ED Notes (Signed)
 Pt verbalized consent for emergency release blood transfusion with MD at bedside.

## 2024-08-12 NOTE — ED Notes (Signed)
 Pt to be transported to the ICU at this time by float RN. Called and gave verbal report to Milo, Charity fundraiser. Notified of need to completed transfusion vital signs. Pt stable at time of transport

## 2024-08-12 NOTE — ED Triage Notes (Signed)
 Pt presents to the ED via [facility] from [facility]. Pt was getting ready to do PT and had a syncopal episode. Pt was here 1 month ago for PNA, was intubated, and had blood transfusions per EMS. Pt reports that he has been having bloody stools for the past month.   HR 92 100% on 2L (baseline O2) CBG 212 ETCO2 27 Temp 99.1  BP 87/54

## 2024-08-13 ENCOUNTER — Inpatient Hospital Stay

## 2024-08-13 ENCOUNTER — Other Ambulatory Visit: Payer: Self-pay

## 2024-08-13 DIAGNOSIS — R578 Other shock: Secondary | ICD-10-CM | POA: Diagnosis not present

## 2024-08-13 DIAGNOSIS — D62 Acute posthemorrhagic anemia: Secondary | ICD-10-CM | POA: Diagnosis not present

## 2024-08-13 DIAGNOSIS — R7989 Other specified abnormal findings of blood chemistry: Secondary | ICD-10-CM

## 2024-08-13 DIAGNOSIS — K31811 Angiodysplasia of stomach and duodenum with bleeding: Secondary | ICD-10-CM

## 2024-08-13 DIAGNOSIS — J9621 Acute and chronic respiratory failure with hypoxia: Secondary | ICD-10-CM

## 2024-08-13 DIAGNOSIS — I350 Nonrheumatic aortic (valve) stenosis: Secondary | ICD-10-CM

## 2024-08-13 DIAGNOSIS — J441 Chronic obstructive pulmonary disease with (acute) exacerbation: Secondary | ICD-10-CM | POA: Diagnosis not present

## 2024-08-13 DIAGNOSIS — Z8679 Personal history of other diseases of the circulatory system: Secondary | ICD-10-CM

## 2024-08-13 DIAGNOSIS — U071 COVID-19: Secondary | ICD-10-CM | POA: Diagnosis not present

## 2024-08-13 LAB — CBC WITH DIFFERENTIAL/PLATELET
Abs Immature Granulocytes: 0.07 K/uL (ref 0.00–0.07)
Abs Immature Granulocytes: 0.07 K/uL (ref 0.00–0.07)
Abs Immature Granulocytes: 0.08 K/uL — ABNORMAL HIGH (ref 0.00–0.07)
Abs Immature Granulocytes: 0.04 K/uL (ref 0.00–0.07)
Basophils Absolute: 0.1 K/uL (ref 0.0–0.1)
Basophils Absolute: 0.1 K/uL (ref 0.0–0.1)
Basophils Absolute: 0.1 K/uL (ref 0.0–0.1)
Basophils Absolute: 0 K/uL (ref 0.0–0.1)
Basophils Relative: 1 %
Basophils Relative: 1 %
Basophils Relative: 0 %
Basophils Relative: 0 %
Eosinophils Absolute: 0.2 K/uL (ref 0.0–0.5)
Eosinophils Absolute: 0.2 K/uL (ref 0.0–0.5)
Eosinophils Absolute: 0 K/uL (ref 0.0–0.5)
Eosinophils Absolute: 0 K/uL (ref 0.0–0.5)
Eosinophils Relative: 2 %
Eosinophils Relative: 1 %
Eosinophils Relative: 0 %
Eosinophils Relative: 0 %
HCT: 25.3 % — ABNORMAL LOW (ref 39.0–52.0)
HCT: 27.7 % — ABNORMAL LOW (ref 39.0–52.0)
HCT: 28.5 % — ABNORMAL LOW (ref 39.0–52.0)
HCT: 30.1 % — ABNORMAL LOW (ref 39.0–52.0)
Hemoglobin: 8.3 g/dL — ABNORMAL LOW (ref 13.0–17.0)
Hemoglobin: 8.8 g/dL — ABNORMAL LOW (ref 13.0–17.0)
Hemoglobin: 9 g/dL — ABNORMAL LOW (ref 13.0–17.0)
Hemoglobin: 9.5 g/dL — ABNORMAL LOW (ref 13.0–17.0)
Immature Granulocytes: 1 %
Immature Granulocytes: 1 %
Immature Granulocytes: 1 %
Immature Granulocytes: 0 %
Lymphocytes Relative: 13 %
Lymphocytes Relative: 9 %
Lymphocytes Relative: 3 %
Lymphocytes Relative: 4 %
Lymphs Abs: 1.7 K/uL (ref 0.7–4.0)
Lymphs Abs: 1.3 K/uL (ref 0.7–4.0)
Lymphs Abs: 0.4 K/uL — ABNORMAL LOW (ref 0.7–4.0)
Lymphs Abs: 0.4 K/uL — ABNORMAL LOW (ref 0.7–4.0)
MCH: 28.5 pg (ref 26.0–34.0)
MCH: 28.2 pg (ref 26.0–34.0)
MCH: 28 pg (ref 26.0–34.0)
MCH: 28.2 pg (ref 26.0–34.0)
MCHC: 32.8 g/dL (ref 30.0–36.0)
MCHC: 31.8 g/dL (ref 30.0–36.0)
MCHC: 31.6 g/dL (ref 30.0–36.0)
MCHC: 31.6 g/dL (ref 30.0–36.0)
MCV: 86.9 fL (ref 80.0–100.0)
MCV: 88.8 fL (ref 80.0–100.0)
MCV: 88.5 fL (ref 80.0–100.0)
MCV: 89.3 fL (ref 80.0–100.0)
Monocytes Absolute: 1.6 K/uL — ABNORMAL HIGH (ref 0.1–1.0)
Monocytes Absolute: 1.6 K/uL — ABNORMAL HIGH (ref 0.1–1.0)
Monocytes Absolute: 0.2 K/uL (ref 0.1–1.0)
Monocytes Absolute: 0.1 K/uL (ref 0.1–1.0)
Monocytes Relative: 12 %
Monocytes Relative: 10 %
Monocytes Relative: 2 %
Monocytes Relative: 1 %
Neutro Abs: 9.3 K/uL — ABNORMAL HIGH (ref 1.7–7.7)
Neutro Abs: 11.8 K/uL — ABNORMAL HIGH (ref 1.7–7.7)
Neutro Abs: 13.1 K/uL — ABNORMAL HIGH (ref 1.7–7.7)
Neutro Abs: 10.4 K/uL — ABNORMAL HIGH (ref 1.7–7.7)
Neutrophils Relative %: 71 %
Neutrophils Relative %: 78 %
Neutrophils Relative %: 94 %
Neutrophils Relative %: 95 %
Platelets: 302 K/uL (ref 150–400)
Platelets: 328 K/uL (ref 150–400)
Platelets: 291 K/uL (ref 150–400)
Platelets: 342 K/uL (ref 150–400)
RBC: 2.91 MIL/uL — ABNORMAL LOW (ref 4.22–5.81)
RBC: 3.12 MIL/uL — ABNORMAL LOW (ref 4.22–5.81)
RBC: 3.22 MIL/uL — ABNORMAL LOW (ref 4.22–5.81)
RBC: 3.37 MIL/uL — ABNORMAL LOW (ref 4.22–5.81)
RDW: 16.1 % — ABNORMAL HIGH (ref 11.5–15.5)
RDW: 16.5 % — ABNORMAL HIGH (ref 11.5–15.5)
RDW: 16.3 % — ABNORMAL HIGH (ref 11.5–15.5)
RDW: 16.4 % — ABNORMAL HIGH (ref 11.5–15.5)
WBC: 13 K/uL — ABNORMAL HIGH (ref 4.0–10.5)
WBC: 15 K/uL — ABNORMAL HIGH (ref 4.0–10.5)
WBC: 13.9 K/uL — ABNORMAL HIGH (ref 4.0–10.5)
WBC: 11 K/uL — ABNORMAL HIGH (ref 4.0–10.5)
nRBC: 0 % (ref 0.0–0.2)
nRBC: 0 % (ref 0.0–0.2)
nRBC: 0 % (ref 0.0–0.2)
nRBC: 0 % (ref 0.0–0.2)

## 2024-08-13 LAB — RESPIRATORY PANEL BY PCR

## 2024-08-13 LAB — COMPREHENSIVE METABOLIC PANEL WITH GFR
ALT: 12 U/L (ref 0–44)
AST: 19 U/L (ref 15–41)
Albumin: 2.9 g/dL — ABNORMAL LOW (ref 3.5–5.0)
Alkaline Phosphatase: 50 U/L (ref 38–126)
Anion gap: 7 (ref 5–15)
BUN: 28 mg/dL — ABNORMAL HIGH (ref 8–23)
CO2: 26 mmol/L (ref 22–32)
Calcium: 7.8 mg/dL — ABNORMAL LOW (ref 8.9–10.3)
Chloride: 101 mmol/L (ref 98–111)
Creatinine, Ser: 1.56 mg/dL — ABNORMAL HIGH (ref 0.61–1.24)
GFR, Estimated: 49 mL/min — ABNORMAL LOW (ref >60)
Glucose, Bld: 107 mg/dL — ABNORMAL HIGH (ref 70–99)
Potassium: 3.5 mmol/L (ref 3.5–5.1)
Sodium: 134 mmol/L — ABNORMAL LOW (ref 135–145)
Total Bilirubin: 1.3 mg/dL — ABNORMAL HIGH (ref 0.0–1.2)
Total Protein: 6.6 g/dL (ref 6.5–8.1)

## 2024-08-13 LAB — URINALYSIS, COMPLETE (UACMP) WITH MICROSCOPIC
Bacteria, UA: NONE SEEN
Bilirubin Urine: NEGATIVE
Glucose, UA: >=500 mg/dL — AB
Ketones, ur: NEGATIVE mg/dL
Leukocytes,Ua: NEGATIVE
Nitrite: NEGATIVE
Protein, ur: NEGATIVE mg/dL
Specific Gravity, Urine: 1.026 (ref 1.005–1.030)
pH: 5 (ref 5.0–8.0)

## 2024-08-13 LAB — RESP PANEL BY RT-PCR (RSV, FLU A&B, COVID)  RVPGX2
Influenza A by PCR: NEGATIVE
Influenza B by PCR: NEGATIVE
Resp Syncytial Virus by PCR: NEGATIVE
SARS Coronavirus 2 by RT PCR: POSITIVE — AB

## 2024-08-13 LAB — MAGNESIUM: Magnesium: 2.1 mg/dL (ref 1.7–2.4)

## 2024-08-13 LAB — GLUCOSE, CAPILLARY
Glucose-Capillary: 100 mg/dL — ABNORMAL HIGH (ref 70–99)
Glucose-Capillary: 104 mg/dL — ABNORMAL HIGH (ref 70–99)
Glucose-Capillary: 177 mg/dL — ABNORMAL HIGH (ref 70–99)
Glucose-Capillary: 179 mg/dL — ABNORMAL HIGH (ref 70–99)
Glucose-Capillary: 195 mg/dL — ABNORMAL HIGH (ref 70–99)

## 2024-08-13 LAB — MRSA NEXT GEN BY PCR, NASAL: MRSA by PCR Next Gen: DETECTED — AB

## 2024-08-13 LAB — PHOSPHORUS: Phosphorus: 4.3 mg/dL (ref 2.5–4.6)

## 2024-08-13 LAB — PROCALCITONIN: Procalcitonin: <0.1 ng/mL

## 2024-08-13 MED ORDER — FUROSEMIDE 10 MG/ML IJ SOLN
40.0000 mg | Freq: Once | INTRAMUSCULAR | Status: AC
  Administered 2024-08-13: 40 mg via INTRAVENOUS
  Filled 2024-08-13: qty 4

## 2024-08-13 MED ORDER — HYDROXYZINE HCL 10 MG PO TABS
10.0000 mg | ORAL_TABLET | Freq: Three times a day (TID) | ORAL | Status: DC | PRN
  Administered 2024-08-13 – 2024-08-22 (×9): 10 mg via ORAL
  Filled 2024-08-13 (×13): qty 1

## 2024-08-13 MED ORDER — VITAMIN D (ERGOCALCIFEROL) 1.25 MG (50000 UNIT) PO CAPS
50000.0000 [IU] | ORAL_CAPSULE | ORAL | Status: DC
  Administered 2024-08-13 – 2024-08-20 (×2): 50000 [IU] via ORAL
  Filled 2024-08-13 (×3): qty 1

## 2024-08-13 MED ORDER — METHYLPREDNISOLONE SODIUM SUCC 125 MG IJ SOLR
125.0000 mg | Freq: Once | INTRAMUSCULAR | Status: AC
  Administered 2024-08-13: 125 mg via INTRAVENOUS
  Filled 2024-08-13: qty 2

## 2024-08-13 MED ORDER — VITAMIN C 500 MG PO TABS
500.0000 mg | ORAL_TABLET | Freq: Every day | ORAL | Status: DC
  Administered 2024-08-13 – 2024-08-22 (×10): 500 mg via ORAL
  Filled 2024-08-13 (×10): qty 1

## 2024-08-13 MED ORDER — VANCOMYCIN HCL 2000 MG/400ML IV SOLN
2000.0000 mg | Freq: Once | INTRAVENOUS | Status: AC
  Administered 2024-08-13: 2000 mg via INTRAVENOUS
  Filled 2024-08-13: qty 400

## 2024-08-13 MED ORDER — SODIUM CHLORIDE 0.9 % IV SOLN
2.0000 g | Freq: Two times a day (BID) | INTRAVENOUS | Status: DC
  Administered 2024-08-13 – 2024-08-14 (×2): 2 g via INTRAVENOUS
  Filled 2024-08-13 (×2): qty 12.5

## 2024-08-13 MED ORDER — IPRATROPIUM-ALBUTEROL 20-100 MCG/ACT IN AERS
1.0000 | INHALATION_SPRAY | Freq: Four times a day (QID) | RESPIRATORY_TRACT | Status: DC | PRN
  Administered 2024-08-20 – 2024-08-22 (×3): 1 via RESPIRATORY_TRACT
  Filled 2024-08-13: qty 4

## 2024-08-13 MED ORDER — ENSURE PLUS HIGH PROTEIN PO LIQD
237.0000 mL | Freq: Three times a day (TID) | ORAL | Status: DC
  Administered 2024-08-13 – 2024-08-18 (×14): 237 mL via ORAL

## 2024-08-13 MED ORDER — MUPIROCIN 2 % EX OINT
TOPICAL_OINTMENT | Freq: Two times a day (BID) | CUTANEOUS | Status: DC
  Administered 2024-08-13 – 2024-08-22 (×2): 1 via NASAL
  Filled 2024-08-13 (×2): qty 22

## 2024-08-13 MED ORDER — AMIODARONE HCL 200 MG PO TABS
200.0000 mg | ORAL_TABLET | Freq: Every day | ORAL | Status: DC
  Administered 2024-08-13 – 2024-08-22 (×10): 200 mg via ORAL
  Filled 2024-08-13 (×10): qty 1

## 2024-08-13 MED ORDER — POTASSIUM CHLORIDE CRYS ER 20 MEQ PO TBCR
20.0000 meq | EXTENDED_RELEASE_TABLET | Freq: Once | ORAL | Status: AC
  Administered 2024-08-13: 20 meq via ORAL
  Filled 2024-08-13: qty 1

## 2024-08-13 MED ORDER — METHYLPREDNISOLONE SODIUM SUCC 40 MG IJ SOLR
40.0000 mg | Freq: Two times a day (BID) | INTRAMUSCULAR | Status: DC
  Administered 2024-08-13 – 2024-08-15 (×4): 40 mg via INTRAVENOUS
  Filled 2024-08-13 (×4): qty 1

## 2024-08-13 MED ORDER — IPRATROPIUM-ALBUTEROL 0.5-2.5 (3) MG/3ML IN SOLN: 3.0000 mL | RESPIRATORY_TRACT | Status: DC

## 2024-08-13 MED ORDER — MELATONIN 5 MG PO TABS
5.0000 mg | ORAL_TABLET | Freq: Every evening | ORAL | Status: DC | PRN
  Administered 2024-08-15 – 2024-08-18 (×5): 5 mg via ORAL
  Filled 2024-08-13 (×4): qty 1

## 2024-08-13 MED ORDER — VANCOMYCIN HCL 1500 MG/300ML IV SOLN
1500.0000 mg | INTRAVENOUS | Status: DC
  Administered 2024-08-14: 1500 mg via INTRAVENOUS
  Filled 2024-08-13: qty 300

## 2024-08-13 MED ORDER — ROSUVASTATIN CALCIUM 10 MG PO TABS
20.0000 mg | ORAL_TABLET | Freq: Every day | ORAL | Status: DC
  Administered 2024-08-13 – 2024-08-22 (×10): 20 mg via ORAL
  Filled 2024-08-13 (×10): qty 2

## 2024-08-13 MED ORDER — GABAPENTIN 300 MG PO CAPS
300.0000 mg | ORAL_CAPSULE | Freq: Two times a day (BID) | ORAL | Status: DC
  Administered 2024-08-13 – 2024-08-22 (×19): 300 mg via ORAL
  Filled 2024-08-13 (×19): qty 1

## 2024-08-13 MED ORDER — IPRATROPIUM-ALBUTEROL 0.5-2.5 (3) MG/3ML IN SOLN: 3.0000 mL | Freq: Four times a day (QID) | RESPIRATORY_TRACT | Status: DC

## 2024-08-13 NOTE — Consult Note (Signed)
 [facility] CARDIOLOGY CONSULT NOTE       Patient ID: [name] MRN: [id number] DOB/AGE: [id number] 65 y.o.  Admit date: 08/12/2024 Referring Physician Dr. Caleen Primary Physician Osa Geralds, NP Primary Cardiologist Tinnie Maiden, NP/ Dr. Katina Reason for Consultation elevated trop, severe AS, complicated cardiac hx  HPI: [name] is a 63 y.o. 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 Of note, patient recently hospitalized 07/26 - 08/14 for ETOH withdrawal requiring mechanical intubation with PNA due to recurrent aspiration with AoC CHF. Patient was discharged to [facility]. During this admission, Hgb found to be 4.7. Troponin found to be elevated, patient denies any chest pain.  Cardiology was consulted for further evaluation.   Work up in the ED notable for sodium 133, potassium 3.7, creatinine 1.68, albumin  3.1, GFR 45, hemoglobin 4.7, WBC 12.2, platelets 330.  Lactic acid elevated at 3.2 > 2.4.  Troponins elevated and flat 888 > 793. EKG with sinus rhythm with borderline ST depression (similar to prior EKGs), rate 93 bpm.  BNP elevated at 1500.  CXR with small right pleural effusion and mild pulmonary vascular congestion.  Upon admission BP soft, has improved s/p IV fluids.  Patient received 2 units PRBCs due to Hgb 4.7.   At the time of my evaluation this afternoon, patient was resting comfortably in hospital bed.  We discussed patient's symptoms in further detail.  Patient states EMS was called to [facility] was called due to worsening  generalized weakness, fatigue and black/bloody stools.  Patient denies any chest pain, palpitations or syncope.  Patient endorses mild SOB, however states it feels overall near baseline.  Patient denies orthopnea or lower extremity swelling.  Patient states he mainly felt so weak that he could not get up to walk to the bathroom.  Pertinent Cardiac History (Most recent) LHC 07/02/24   Prox LAD to Mid LAD lesion is 90% stenosed.   Ost LM to Mid LM lesion is 50% stenosed.   Ost Cx to Prox Cx lesion is 90% stenosed.   Ost RCA to Prox RCA lesion is 100% stenosed.   Mid Graft to Dist Graft lesion is 95% stenosed.   Recommend uninterrupted dual antiplatelet therapy with Aspirin  81mg  daily and Clopidogrel  75mg  daily for a minimum of 6 months (stable ischemic heart disease-Class I recommendation).   1.  Severe native three-vessel CAD 2.  LIMA to LAD and SVG to OM widely patent 3.  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  Successful direct stenting of the distal SVG to PDA with distal protection with a 4.0 x 15 mm drug-eluting stent with intravascular ultrasound guidance 5.  Aspirin  and clopidogrel  for at least 6 months followed by clopidogrel  indefinitely 6.  Continue workup for aortic valve replacement    Review of systems complete and found to be negative unless listed above    Past Medical History:  Diagnosis Date   COPD (chronic obstructive pulmonary disease) (HCC)    CVA (cerebral infarction)    Headache    mild, since stroke 2012  Hypertension    MI (myocardial infarction) (HCC) 05/09/2012   Reading difficulty    pt reports he reads at about a second grade level   Stroke [facility]) 2012   numbness to left hand    Wears dentures    full upper and lower    Past Surgical History:  Procedure Laterality Date   CAROTID ENDARTERECTOMY Left    2012 or 2013   CHOLECYSTECTOMY N/A 07/26/2016   Procedure: LAPAROSCOPIC CHOLECYSTECTOMY;  Surgeon: Charlie FORBES Fell, MD;  Location:  [facility] ORS;  Service: General;  Laterality: N/A;   COLONOSCOPY N/A 05/08/2024   Procedure: COLONOSCOPY;  Surgeon: Jinny Carmine, MD;  Location: [facility] ENDOSCOPY;  Service: Endoscopy;  Laterality: N/A;   COLONOSCOPY WITH PROPOFOL  N/A 02/09/2022   Procedure: COLONOSCOPY WITH PROPOFOL ;  Surgeon: Jinny Carmine, MD;  Location: [facility] ENDOSCOPY;  Service: Endoscopy;  Laterality: N/A;   CORONARY ARTERY BYPASS GRAFT  05/10/2012   3 vessel   CORONARY STENT INTERVENTION N/A 07/02/2024   Procedure: CORONARY STENT INTERVENTION;  Surgeon: Katina Albright, MD;  Location: [facility] INVASIVE CV LAB;  Service: Cardiovascular;  Laterality: N/A;   CORONARY ULTRASOUND/IVUS N/A 07/02/2024   Procedure: Coronary Ultrasound/IVUS;  Surgeon: Katina Albright, MD;  Location: [facility] INVASIVE CV LAB;  Service: Cardiovascular;  Laterality: N/A;   ENTEROSCOPY N/A 05/16/2023   Procedure: ENTEROSCOPY;  Surgeon: Therisa Bi, MD;  Location: [facility] ENDOSCOPY;  Service: Gastroenterology;  Laterality: N/A;   ENTEROSCOPY N/A 06/01/2023   Procedure: ENTEROSCOPY;  Surgeon: Unk Corinn Skiff, MD;  Location: [facility] ENDOSCOPY;  Service: Gastroenterology;  Laterality: N/A;   ESOPHAGOGASTRODUODENOSCOPY  05/13/2023   Procedure: ESOPHAGOGASTRODUODENOSCOPY (EGD);  Surgeon: Jinny Carmine, MD;  Location: [facility] ENDOSCOPY;  Service: Endoscopy;;   ESOPHAGOGASTRODUODENOSCOPY (EGD) WITH PROPOFOL  N/A 10/28/2022   Procedure: ESOPHAGOGASTRODUODENOSCOPY (EGD) WITH PROPOFOL ;  Surgeon: Onita Elspeth Sharper, DO;  Location[facility] ENDOSCOPY;  Service: Gastroenterology;  Laterality: N/A;   GIVENS CAPSULE STUDY  05/13/2023   Procedure: GIVENS CAPSULE STUDY;  Surgeon: Jinny Carmine, MD;  Location: [facility] ENDOSCOPY;  Service: Endoscopy;;   GIVENS CAPSULE STUDY  06/01/2023   Procedure: GIVENS CAPSULE STUDY;  Surgeon: Unk Corinn Skiff, MD;  Location: [facility] ENDOSCOPY;  Service: Gastroenterology;;   LEFT HEART CATH AND CORS/GRAFTS ANGIOGRAPHY N/A 07/02/2024   Procedure: LEFT HEART CATH AND CORS/GRAFTS  ANGIOGRAPHY;  Surgeon: Katina Albright, MD;  Location: [facility] INVASIVE CV LAB;  Service: Cardiovascular;  Laterality: N/A;    Medications Prior to Admission  Medication Sig Dispense Refill Last Dose/Taking   albuterol  (VENTOLIN  HFA) 108 (90 Base) MCG/ACT inhaler Inhale 2 puffs into the lungs every 6 (six) hours as needed for wheezing or shortness of breath. 6.7 g 2 08/12/2024 at 12:56 AM   amiodarone  (PACERONE ) 200 MG tablet Take 1 tablet (200 mg total) by mouth daily.   08/12/2024 at  8:13 AM   ascorbic acid  (VITAMIN C ) 500 MG tablet Take 1 tablet (500 mg total) by mouth daily. 30 tablet 2 08/12/2024 at  8:13 AM   aspirin  EC 81 MG tablet Take 1 tablet (81 mg total) by mouth daily. Swallow whole. 30 tablet 5 08/12/2024 at  8:13 AM   budesonide  (PULMICORT ) 0.5 MG/2ML nebulizer solution Take 2 mLs (0.5 mg total) by nebulization 2 (two) times daily.   08/12/2024 at  8:13 AM   clopidogrel  (PLAVIX ) 75 MG tablet Take 1 tablet (75 mg total) by mouth daily with breakfast. 30 tablet 11 08/12/2024 at  5:35 AM   dapagliflozin  propanediol (FARXIGA ) 10 MG TABS tablet Take 1 tablet (10 mg  total) by mouth daily. 30 tablet 11 08/12/2024 at  8:13 AM   dextromethorphan -guaiFENesin  (MUCINEX  DM) 30-600 MG 12hr tablet Take 1 tablet by mouth 2 (two) times daily as needed for cough.   Unknown   folic acid  (FOLVITE ) 1 MG tablet Take 1 tablet (1 mg total) by mouth daily. 30 tablet 2 08/12/2024 at  8:13 AM   Glycerin , Laxative, (GLYCERIN , ADULT,) 2 g SUPP Place 1 suppository rectally daily as needed for moderate constipation.   Unknown   HYDROcodone -acetaminophen  (NORCO/VICODIN) 5-325 MG tablet Take 1 tablet by mouth every 6 (six) hours as needed for moderate pain (pain score 4-6).   Unknown   ipratropium-albuterol  (DUONEB) 0.5-2.5 (3) MG/3ML SOLN Take 3 mLs by nebulization 3 (three) times daily.   08/12/2024 at 11:44 AM   iron  polysaccharides (NIFEREX) 150 MG capsule Take 1 capsule (150 mg total) by mouth daily. 30 capsule 2 08/12/2024 at   8:13 AM   lactulose  (CHRONULAC ) 10 GM/15ML solution Take 30 mLs (20 g total) by mouth daily as needed for moderate constipation or mild constipation.   08/10/2024   lidocaine  4 % Place 1 patch onto the skin daily.   08/12/2024 at  8:13 AM   melatonin 5 MG TABS Place 1 tablet (5 mg total) into feeding tube at bedtime as needed.   Unknown   Multiple Vitamin (MULTIVITAMIN WITH MINERALS) TABS tablet Take 1 tablet by mouth daily.   08/12/2024 at  8:13 AM   nicotine  (NICODERM CQ  - DOSED IN MG/24 HR) 7 mg/24hr patch Place 1 patch (7 mg total) onto the skin daily.   08/12/2024 at  8:13 AM   polyethylene glycol (MIRALAX  / GLYCOLAX ) 17 g packet Take 17 g by mouth at bedtime.   08/10/2024   rosuvastatin  (CRESTOR ) 20 MG tablet Take 1 tablet (20 mg total) by mouth daily.   08/11/2024 at  9:22 PM   senna-docusate (SENOKOT-S) 8.6-50 MG tablet Take 2 tablets by mouth at bedtime.   08/11/2024 at  9:22 AM   sodium chloride  (OCEAN) 0.65 % SOLN nasal spray Place 1 spray into both nostrils as needed for congestion.   Unknown   thiamine  (VITAMIN B-1) 100 MG tablet Take 1 tablet (100 mg total) by mouth daily.   08/12/2024 at  8:13 AM   torsemide  (DEMADEX ) 20 MG tablet Take 1 tablet (20 mg total) by mouth daily.   08/12/2024 at  8:13 AM   traZODone  (DESYREL ) 50 MG tablet Take 50 mg by mouth at bedtime.   08/11/2024 at  9:22 PM   Vitamin D , Ergocalciferol , (DRISDOL ) 1.25 MG (50000 UNIT) CAPS capsule Take 1 capsule (50,000 Units total) by mouth every 7 (seven) days.   08/09/2024   feeding supplement (ENSURE PLUS HIGH PROTEIN) LIQD Take 237 mLs by mouth 3 (three) times daily between meals.      Social History   Socioeconomic History   Marital status: Divorced    Spouse name: Not on file   Number of children: Not on file   Years of education: Not on file   Highest education level: Not on file  Occupational History   Not on file  Tobacco Use   Smoking status: Former    Current packs/day: 0.00    Types: Cigarettes    Quit  date: 70    Years since quitting: 32.6   Smokeless tobacco: Current    Types: Snuff   Tobacco comments:    changed to dip  Vaping Use   Vaping status: Never Used  Substance and Sexual Activity   Alcohol  use: Yes    Alcohol /week: 26.0 standard drinks of alcohol     Types: 26 Cans of beer per week   Drug use: No   Sexual activity: Yes    Birth control/protection: None  Other Topics Concern   Not on file  Social History Narrative   Not on file   Social Drivers of Health   Financial Resource Strain: Not on file  Food Insecurity: No Food Insecurity (08/13/2024)   Hunger Vital Sign    Worried About Running Out of Food in the Last Year: Never true    Ran Out of Food in the Last Year: Never true  Transportation Needs: No Transportation Needs (08/13/2024)   PRAPARE - Administrator, Civil Service (Medical): No    Lack of Transportation (Non-Medical): No  Physical Activity: Not on file  Stress: Not on file  Social Connections: Socially Isolated (08/13/2024)   Social Connection and Isolation Panel    Frequency of Communication with Friends and Family: More than three times a week    Frequency of Social Gatherings with Friends and Family: More than three times a week    Attends Religious Services: Never    Database administrator or Organizations: No    Attends Banker Meetings: Never    Marital Status: Divorced  Catering manager Violence: Not At Risk (08/13/2024)   Humiliation, Afraid, Rape, and Kick questionnaire    Fear of Current or Ex-Partner: No    Emotionally Abused: No    Physically Abused: No    Sexually Abused: No    Family History  Problem Relation Age of Onset   Pneumonia Mother      Vitals:   08/13/24 1345 08/13/24 1400 08/13/24 1415 08/13/24 1430  BP:  111/61    Pulse:   (!) 109 (!) 107  Resp: 19 (!) 22 17 (!) 23  Temp:      TempSrc:      SpO2:   94% 100%  Weight:      Height:        PHYSICAL EXAM General: Chronically  ill-appearing elderly male, well nourished, in no acute distress. HEENT: Normocephalic and atraumatic. Neck: No JVD.   Lungs: Normal respiratory effort on 3L. Clear bilaterally to auscultation, diminished bilaterally. Heart: HRRR. Normal S1 and S2, + systolic murmur.  Abdomen: Non-distended appearing.  Msk: Normal strength and tone for age. Extremities: Warm and well perfused. No clubbing, cyanosis, edema.  Neuro: Alert and oriented X 3. Psych: Answers questions appropriately.   Labs: Basic Metabolic Panel: Recent Labs    08/12/24 1457 08/13/24 0401  NA 133* 134*  K 3.7 3.5  CL 94* 101  CO2 26 26  GLUCOSE 133* 107*  BUN 33* 28*  CREATININE 1.68* 1.56*  CALCIUM  8.4* 7.8*  MG  --  2.1  PHOS  --  4.3   Liver Function Tests: Recent Labs    08/12/24 1457 08/13/24 0401  AST 31 19  ALT 14 12  ALKPHOS 49 50  BILITOT 1.0 1.3*  PROT 6.9 6.6  ALBUMIN  3.1* 2.9*   Recent Labs    08/12/24 1457  LIPASE 33   CBC: Recent Labs    08/13/24 0856 08/13/24 1513  WBC 15.0* 13.9*  NEUTROABS 11.8* 13.1*  HGB 8.8* 9.0*  HCT 27.7* 28.5*  MCV 88.8 88.5  PLT 328 291   Cardiac Enzymes: Recent Labs    08/12/24 1457 08/12/24 1657  TROPONINIHS 888* 793*  BNP: Recent Labs    08/12/24 1457  BNP 1,514.9*   D-Dimer: No results for input(s): DDIMER in the last 72 hours. Hemoglobin A1C: No results for input(s): HGBA1C in the last 72 hours. Fasting Lipid Panel: No results for input(s): CHOL, HDL, LDLCALC, TRIG, CHOLHDL, LDLDIRECT in the last 72 hours. Thyroid Function Tests: No results for input(s): TSH, T4TOTAL, T3FREE, THYROIDAB in the last 72 hours.  Invalid input(s): FREET3 Anemia Panel: No results for input(s): VITAMINB12, FOLATE, FERRITIN, TIBC, IRON , RETICCTPCT in the last 72 hours.   Radiology: DG Chest Portable 1 View Result Date: 08/12/2024 CLINICAL DATA:  Syncopal episode. Septic shock. Clinical concern for pneumonia. EXAM:  PORTABLE CHEST 1 VIEW COMPARISON:  07/29/2024. Abdomen and pelvis CTA dated 08/12/2024. Chest CTA dated 07/11/2024. FINDINGS: Interval enlarged cardiac silhouette. Stable post CABG changes. The lungs are hyperexpanded with mildly prominent upper lung zone pulmonary vasculature. Small right pleural effusion. Biapical bullous changes. Unremarkable bones. IMPRESSION: 1. Interval cardiomegaly and mild pulmonary vascular congestion. 2. Small right pleural effusion. 3. COPD with biapical bullous changes. Electronically Signed   By: Elspeth Bathe M.D.   On: 08/12/2024 16:58   CT ANGIO GI BLEED Result Date: 08/12/2024 CLINICAL DATA:  Anemia, syncope and melena. EXAM: CTA ABDOMEN AND PELVIS WITHOUT AND WITH CONTRAST TECHNIQUE: Multidetector CT imaging of the abdomen and pelvis was performed using the standard protocol during bolus administration of intravenous contrast. Multiplanar reconstructed images and MIPs were obtained and reviewed to evaluate the vascular anatomy. RADIATION DOSE REDUCTION: This exam was performed according to the departmental dose-optimization program which includes automated exposure control, adjustment of the mA and/or kV according to patient size and/or use of iterative reconstruction technique. CONTRAST:  OMNIPAQUE  IOHEXOL  350 MG/ML SOLN COMPARISON:  CT abdomen and pelvis 03/24/2016 FINDINGS: VASCULAR Aorta: Atherosclerosis of the abdominal aorta without aneurysm. Ulcerated plaque of the anterior infrarenal segment. Celiac: Calcified plaque causes approximately 50-60% origin stenosis. Distal branches are normally patent. SMA: Atherosclerosis of SMA trunk without significant stenosis. Renals: Heavily calcified plaque in proximal segments bilateral single renal arteries causes moderate stenoses of approximately 50-70%. The left renal artery almost immediately bifurcates beyond its origin. IMA: Patent small caliber IMA. Inflow: Heavily calcified bilateral iliac arteries with diffuse disease  and no aneurysmal disease or obstruction. Proximal Outflow: Calcified common femoral arteries. Probable significant stenosis at the origin of the right SFA. Veins: Venous phase imaging demonstrates patent venous structures in the abdomen and pelvis without thrombus or other abnormalities. Review of the MIP images confirms the above findings. NON-VASCULAR Lower chest: No acute abnormality. Hepatobiliary: Potential early cirrhotic morphology of the liver. No hepatic masses identified. Status post cholecystectomy. No biliary dilatation. Pancreas: Unremarkable. No pancreatic ductal dilatation or surrounding inflammatory changes. Spleen: Normal in size without focal abnormality. Adrenals/Urinary Tract: Adrenal glands are unremarkable. Kidneys are normal, without renal calculi, focal lesion, or hydronephrosis. Bladder is unremarkable. Stomach/Bowel: Speckled high density material in the stomach is present on imaging prior to contrast administration. There is no evidence active bleeding on arterial or venous phases of imaging within the gastrointestinal tract. Bowel shows no evidence of obstruction, ileus, inflammation or lesion. The appendix is not discretely visualized. No free intraperitoneal air. Lymphatic: No enlarged abdominal or pelvic lymph nodes. Reproductive: Prostate is unremarkable. Other: No abdominal wall hernia or abnormality. No abdominopelvic ascites. Musculoskeletal: Mild loss of height of the T12 vertebral body. IMPRESSION: 1. No evidence of active bleeding within the gastrointestinal tract. 2. Atherosclerosis of the abdominal aorta and iliac arteries without  aneurysm. Ulcerated plaque of the anterior infrarenal aorta. 3. 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 4. Moderate stenoses of approximately 50-70% in proximal bilateral renal arteries. 5. Probable significant stenosis at the origin of the right SFA. 6. Potential early cirrhotic morphology of the liver. 7. Mild loss of height of  the T12 vertebral body. Aortic Atherosclerosis (ICD10-I70.0). Electronically Signed   By: Marcey Moan M.D.   On: 08/12/2024 16:21   DG Chest Port 1 View Result Date: 07/29/2024 CLINICAL DATA:  Congestive heart failure. EXAM: PORTABLE CHEST 1 VIEW COMPARISON:  07/24/2024 FINDINGS: The enteric tube has been removed. Post median sternotomy with stable cardiomegaly. Bilateral pleural effusions and bibasilar opacities, slightly worsened in the interim. Improved interstitial opacities with mild residual. Underlying emphysematous change. No pneumothorax. IMPRESSION: 1. Bilateral pleural effusions and bibasilar opacities, slightly worsened in the interim. 2. Improved interstitial opacities with mild residual. Electronically Signed   By: Andrea Gasman M.D.   On: 07/29/2024 09:53   DG Chest 1 View Result Date: 07/24/2024 CLINICAL DATA:  [id number] Dyspnea [id number] EXAM: CHEST  1 VIEW COMPARISON:  July 23, 2024, July 11, 2024 FINDINGS: Esophagogastric tube terminates below the diaphragm, outside the field of view. Diffuse interstitial opacities throughout both lungs. No lobar consolidation or pneumothorax. Lateral right upper lobe nodular opacity measures 1.9 cm, corresponding to the lung nodule on the prior chest CT. Moderate cardiomegaly. Sternotomy wires and CABG markers. No acute fracture or destructive lesions. Multilevel thoracic osteophytosis. IMPRESSION: 1. Moderate cardiomegaly. Unchanged interstitial opacities throughout both lungs, possibly persistent interstitial edema and underlying emphysematous changes. 2. Similarly appearing lateral right upper lung zone nodular opacity. Please see the comparison chest CT for further characterization. Electronically Signed   By: Rogelia Myers M.D.   On: 07/24/2024 08:43   DG Chest Port 1 View Result Date: 07/23/2024 CLINICAL DATA:  Shortness of breath. EXAM: PORTABLE CHEST 1 VIEW COMPARISON:  July 22, 2024. FINDINGS: The heart size and mediastinal contours are  within normal limits. Status post coronary bypass graft. Mild diffuse interstitial densities are noted concerning for possible pulmonary edema with possible small pleural effusions. Nasogastric tube is seen entering stomach. The visualized skeletal structures are unremarkable. IMPRESSION: Mild diffuse interstitial densities are noted concerning for possible pulmonary edema with small pleural effusions. Electronically Signed   By: Lynwood Landy Raddle M.D.   On: 07/23/2024 13:03   DG ABD ACUTE 2+V W 1V CHEST Result Date: 07/22/2024 CLINICAL DATA:  [id number] Dyspnea [id number] [phone] Encounter for imaging study to confirm nasogastric (NG) tube placement [id number] EXAM: DG ABDOMEN ACUTE WITH 1 VIEW CHEST COMPARISON:  Chest x-ray 07/19/2024 FINDINGS: Enteric tube courses below the diaphragm with tip and side port overlying the expected region of the gastric lumen. The heart and mediastinal contours are within normal limits. No focal consolidation. Chronic coarsened interstitial markings with no overt pulmonary edema. Bilateral trace pleural effusion. No pneumothorax. Right upper quadrant surgical clips. There is no evidence of dilated bowel loops or free intraperitoneal air. No radiopaque calculi or other significant radiographic abnormality is seen. No acute osseous abnormality.  Intact sternotomy wires. IMPRESSION: 1. Enteric tube in good position. 2. Bowel trace pleural effusions. 3. Nonobstructive bowel gas pattern. Electronically Signed   By: Morgane  Naveau M.D.   On: 07/22/2024 14:18   DG Chest Port 1 View Result Date: 07/19/2024 CLINICAL DATA:  Dyspnea EXAM: PORTABLE CHEST 1 VIEW COMPARISON:  07/18/2024 FINDINGS: Two frontal views of the chest demonstrate an enteric catheter passing below diaphragm, tip  excluded by collimation. Left internal jugular catheter tip overlies superior vena cava unchanged. 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 The cardiac silhouette is stable. Mild central vascular congestion, with a trace right  pleural effusion unchanged since prior exam. The nodular density projecting over the right upper lateral chest on prior study is less well seen on this exam. This may be due to positioning. No pneumothorax. No acute bony abnormalities. IMPRESSION: 1. Pulmonary vascular congestion and small right pleural effusion, unchanged since prior study. 2. The nodular consolidation within the right upper lateral chest seen on prior x-ray and CT is not well seen on this exam, likely due to patient positioning. Electronically Signed   By: Ozell Daring M.D.   On: 07/19/2024 17:34   DG Chest Port 1 View Result Date: 07/18/2024 EXAM: 1 VIEW XRAY OF THE CHEST 07/18/2024 04:59:06 AM COMPARISON: 07/16/2024 CLINICAL HISTORY: [id number] Acute respiratory failure with hypoxia (HCC) [id number]. Acute respiratory failure with hypoxia FINDINGS: LUNGS AND PLEURA: Nodular opacity within the periphery of the right upper lobe appears similar to CT from 07/11/2024. Please refer to that report for follow-up recommendations. Mild asymmetric hazy opacification over the right mid and right lower lung may be related to positional artifact versus small posterior layering effusion. Mild subsegmental atelectasis in the left base. HEART AND MEDIASTINUM: Stable cardiomediastinal contours. Previous median sternotomy and CABG procedure. BONES AND SOFT TISSUES: Aortic atherosclerotic calcification. Left IJ catheter is identified with the tip in the projection of the SVC. There is an enteric tube which courses below the field of view. ET tube tip is stable above the carina. IMPRESSION: 1. Stable nodular opacity within the periphery of the right upper lobe, similar to CT from 07/11/2024. Please refer to that report for follow-up recommendations. 2. Mild asymmetric hazy opacification over the right mid and right lower lung, possibly related to rotational artifact versus small posterior layering effusion. 3. Mild subsegmental atelectasis in the left base.  Electronically signed by: Waddell Calk MD 07/18/2024 06:51 AM EDT RP Workstation: [id number]   DG Chest Port 1 View Result Date: 07/16/2024 CLINICAL DATA:  Endotracheal tube and central line placement. EXAM: PORTABLE CHEST 1 VIEW COMPARISON:  07/16/2024 at 8:58 a.m. FINDINGS: Patient is rotated to the right. Endotracheal tube has tip proximally 13.5 cm above the carina. This could be advanced approximately 6 cm. Left IJ central venous catheter has tip over the SVC. Enteric tube courses into the region of the stomach and off the image as tip is not visualized. Lungs are adequately inflated with possible mild hazy opacification over the right upper lung abutting the minor fissure which may be due to atelectasis or infection. Left lung is clear. Cardiomediastinal silhouette and remainder of the exam is unchanged. IMPRESSION: 1. Endotracheal tube has tip proximally 13.5 cm above the carina. This could be advanced approximately 6 cm. 2. Left IJ central venous catheter with tip over the SVC. 3. Possible mild hazy opacification over the right upper lung abutting the minor fissure which may be due to atelectasis or infection. Electronically Signed   By: Toribio Agreste M.D.   On: 07/16/2024 10:53   DG Chest Port 1 View Result Date: 07/16/2024 CLINICAL DATA:  [id number] Aspiration into airway [id number]. EXAM: PORTABLE CHEST 1 VIEW COMPARISON:  06/23/2024. FINDINGS: Bilateral lungs appear hyperlucent with coarse bronchovascular markings, in keeping with COPD. Bilateral lungs otherwise appear clear. No dense consolidation or lung collapse. Bilateral costophrenic angles are clear. Stable cardio-mediastinal silhouette. There are surgical staples along the heart border and sternotomy  wires, status post CABG (coronary artery bypass graft). No acute osseous abnormalities. The soft tissues are within normal limits. Enteric tube is seen coursing below the left hemidiaphragm however, the tip is not included on the film. IMPRESSION: No  active disease. COPD. Electronically Signed   By: Ree Molt M.D.   On: 07/16/2024 09:09   DG Abd 1 View Result Date: 07/16/2024 CLINICAL DATA:  Vomiting.  NG tube placement. EXAM: ABDOMEN - 1 VIEW COMPARISON:  07/15/2024 FINDINGS: Nasogastric tube has tip and side-port over the stomach in the upper abdomen just left of midline. Visualized lower thorax unchanged. Abdominopelvic images demonstrate a non obstructive bowel gas pattern. Mild fecal retention throughout the colon. Surgical clips over the right upper quadrant. There are degenerative changes of the spine and hips. Calcified plaque over the abdominal aorta and iliac arteries. IMPRESSION: 1. Nasogastric tube with tip and side-port over the stomach. 2. Nonobstructive bowel gas pattern. Mild fecal retention. 3. Aortic atherosclerosis. Electronically Signed   By: Toribio Agreste M.D.   On: 07/16/2024 07:51   DG Abd 1 View Result Date: 07/15/2024 CLINICAL DATA:  Orogastric tube placement. EXAM: ABDOMEN - 1 VIEW COMPARISON:  June 29, 2023. FINDINGS: Distal tip of nasogastric tube is seen in expected position of proximal stomach. IMPRESSION: Nasogastric tube tip seen in expected position of proximal stomach. Electronically Signed   By: Lynwood Landy Raddle M.D.   On: 07/15/2024 10:25    ECHO 05/10/2024  1. Left ventricular ejection fraction, by estimation, is 60 to 65%. The left ventricle has normal function. The left ventricle has no regional wall motion abnormalities. The left ventricular internal cavity size was mildly dilated. Left ventricular diastolic parameters are consistent with Grade I diastolic dysfunction (impaired relaxation).   2. Right ventricular systolic function is normal. The right ventricular size is normal.   3. The mitral valve is normal in structure. No evidence of mitral valve regurgitation.   4. The aortic valve is normal in structure. Aortic valve regurgitation is trivial. Severe aortic valve stenosis.   TELEMETRY reviewed by  me 08/13/2024: Sinus rhythm, rate 90s   EKG reviewed by me: sinus rhythm with borderline ST depression (similar to prior EKGs), rate 93 bpm  Data reviewed by me 08/13/2024: last 24h vitals tele labs imaging I/O ED provider note, admission H&P.  Principal Problem:   GI bleed Active Problems:   COPD with acute exacerbation (HCC)   Elevated troponin   Severe aortic stenosis   Hemorrhagic shock (HCC)   History of CAD (coronary artery disease)   COVID-19 virus infection   Acute on chronic hypoxic respiratory failure (HCC)    ASSESSMENT AND PLAN:  [name] is a 63 y.o. 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 Of note, patient recently hospitalized 07/26 - 08/14 for ETOH withdrawal requiring mechanical intubation with PNA due to recurrent aspiration with AoC CHF. Patient was discharged to [facility]. During this admission, Hgb found to be 4.7. Troponin found to be elevated, patient denies any chest pain.  Cardiology was consulted for further evaluation.   # Acute  on chronic hypoxic respiratory failure # COPD exacerbation # Leukocytosis # COVID positive O2 at baseline, 2L. -Management per primary team, on IV abx.  # Chronic HFpEF Patient does not appear significantly overloaded. CXR with mild pulmonary vascular congestion.  No lower extremity edema. Remains on 2-3L O2 (near baseline O2).  -Monitor and replenish electrolytes for a goal K >4, Mag >2  -Hold home torsemide  20 mg daily for now due to significant anemia, not clinically fluid overloaded,  -Resume home  dapagliflozin  10 mg daily when Hgb stabilizes.   # Severe aortic stenosis Echo from 04/2024 revealed severe AS. Aortic valve mean gradient measures  39.0 mmHg. Aortic valve peak gradient 66.3 mmHg. Aortic valve area, by VTI 0.49 cm.   -Originally scheduled to see Dr. Katina 7/30 to discuss aortic valve repair/replacement options, this has been rescheduled because patient was intubated/sedated due to ETOH withdrawal at this time. Appointment on 09/03 at [facility].   # Demand ischemia # Coronary artery disease s/p CABG x3 (04/2012) s/p recent stent (07/02/24) # Hypertension # Hyperlipidemia # Hx Ventricular Ectopy Patient without chest pain. Trops elevated and flat 888 > 793 . EKG with no acute ischemic changes (similar to prior EKG).   -Home plavix  75 mg and aspirin  81 mg held due to acute anemia. Per GI this can be resumed tomorrow if no more evidence of active bleeding.  -Continue Crestor  20 mg daily for high-intensity statin management.  -Continue home amio 200 mg daily for hx ventricular ectopy. Per tele this admission no evidence of PVCs. Will continue to monitor tele.  -Hold home metoprolol  succinate, losartan , Imdur  due to borderline BP. Will resume when BP stabilizes.  -Recommend MAP > 65.  -Elevated and flat trops in setting of GI bleed with severe anemia Hgb 4.7 is most consistent with demand/supply mismatch and not ACS. No further cardiac diagnostics at this time.   # GI bleed # Acute on chronic anemia # Hx AVM Upon admission Hgb 4.7. Patient received 2 units PRBCs and 1 unit of platelets. -Closely monitor H&H. -Goal Hgb >8 due to cardiac history. Recommend transfusion when <8.  -Management per primary team.    This patient's plan of care was discussed and created with Dr. Ammon and he is in agreement.  Signed: Dorene Comfort, PA-C  08/13/2024, 3:47 PM [facility]

## 2024-08-13 NOTE — Assessment & Plan Note (Signed)
 Likely due to COVID infection.  Developed significant congestion and wheeze. - Ordered Solu-Medrol  -DuoNeb every 4 hourly -Continue supportive care

## 2024-08-13 NOTE — TOC Initial Note (Signed)
 Transition of Care [facility]) - Initial/Assessment Note    Patient Details  Name: [name] MRN: [id number] Date of Birth: 1961-06-03  Transition of Care [facility] CM/SW Contact:    Corrie JINNY Ruts, LCSW Phone Number: 08/13/2024, 12:04 PM  Clinical Narrative:                 Chart reviewed. The patient was admitted for a GI bleed. I spoke with the patient via telephone. I introduced myself, my role, and reason for consult. The patient reports that he has been doing ok. The patient report that he has a PCP. The patient reports that he lives by himself. The patient confirmed that he drives himself to medical appointments and he hopes that he is able to transport himself during discharge. The patient confirms that he uses [facility] for pharmacy and he only pays $20 copays. The patient reports that he has never had HH before. The patient confirms that he was recently admitted to [facility] for 9 or 10 days. The patient reports that he has a walker that he does not use. The patient reports that he does not want to go back to [facility]. The patient would like a list of SNF but the patient reports that he can not see that well. I inquired about family members and the patient reports that he does not have any.   I will consult with the providers and the patient for the best placement.      Barriers to Discharge: Continued Medical Work up   Patient Goals and CMS Choice            Expected Discharge Plan and Services       Living arrangements for the past 2 months: Skilled Nursing Facility, Single Family Home                                      Prior Living Arrangements/Services Living arrangements for the past 2 months: Skilled Nursing Facility, Single Family Home Lives with:: Self Patient language and need for interpreter reviewed:: Yes              Criminal Activity/Legal Involvement Pertinent to Current Situation/Hospitalization: No - Comment as  needed  Activities of Daily Living   ADL Screening (condition at time of admission) Independently performs ADLs?: No Does the patient have a NEW difficulty with bathing/dressing/toileting/self-feeding that is expected to last >3 days?: No Does the patient have a NEW difficulty with getting in/out of bed, walking, or climbing stairs that is expected to last >3 days?: No Does the patient have a NEW difficulty with communication that is expected to last >3 days?: No Is the patient deaf or have difficulty hearing?: No Does the patient have difficulty seeing, even when wearing glasses/contacts?: No Does the patient have difficulty concentrating, remembering, or making decisions?: Yes  Permission Sought/Granted                  Emotional Assessment Appearance:: Other (Comment Required (Consulted with the patient over the phone due to the patient having Covid.) Attitude/Demeanor/Rapport: Engaged, Gracious Affect (typically observed): Calm, Pleasant Orientation: : Oriented to Self, Oriented to Place, Oriented to  Time, Oriented to Situation Alcohol  / Substance Use: Not Applicable Psych Involvement: No (comment)  Admission diagnosis:  Hemorrhagic shock (HCC) [R57.8] GI bleed [K92.2] NSTEMI (non-ST elevated myocardial infarction) (HCC) [I21.4] Gastrointestinal hemorrhage, unspecified gastrointestinal hemorrhage  type [K92.2] Patient Active Problem List   Diagnosis Date Noted   GI bleed 08/12/2024   Acute respiratory distress 07/12/2024   Lobar pneumonia (HCC) 07/12/2024   Metabolic acidosis, increased anion gap 07/12/2024   Myocardial injury 07/12/2024   COPD exacerbation (HCC) 07/11/2024   Elevated lactic acid level 07/11/2024   Coronary artery disease 07/02/2024   Acute on chronic diastolic CHF (congestive heart failure) (HCC) 06/25/2024   Symptomatic severe aortic stenosis with normal ejection fraction 06/24/2024   Depression 05/07/2024   Chronic obstructive pulmonary disease  (COPD) (HCC) 05/07/2024   AVM (arteriovenous malformation) of small bowel, 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   Rectal bleeding 06/01/2023   GI bleeding 05/31/2023   CAD (coronary artery disease) 05/31/2023   Stroke (HCC) 05/31/2023   Iron  deficiency anemia 05/31/2023   Hyponatremia 05/13/2023   Alcohol  use disorder 05/13/2023   Elevated troponin 05/13/2023   ABLA (acute blood loss anemia) 05/13/2023   Iron  deficiency anemia due to chronic blood loss 10/30/2022   Obesity hypoventilation syndrome (HCC) 10/29/2022   COPD with acute exacerbation (HCC) 10/28/2022   Melena 10/26/2022   Symptomatic anemia 10/25/2022   Acute diastolic CHF (congestive heart failure) (HCC) 10/25/2022   Essential hypertension 10/25/2022   GERD without esophagitis 10/25/2022   Dyslipidemia 10/25/2022   Special screening for malignant neoplasms, colon    Polyp of sigmoid colon    Orthostatic hypotension 06/04/2021   Stage 3a chronic kidney disease (HCC) 10/05/2020   Alcohol  abuse    Syncope and collapse 05/31/2020   AKI (acute kidney injury) (HCC) 05/31/2020   Hypokalemia 05/31/2020   Memory loss or impairment 12/24/2018   Memory loss of unknown cause 10/14/2018   Sleeping difficulty 10/14/2018   Mild aortic valve stenosis 10/08/2018   Hyperlipidemia 03/27/2018   Cognitive impairment 03/12/2018   History of stroke s/p left carotid endarterectomy 03/12/2018   Hypertension 03/01/2018   Atherosclerosis of native arteries of extremity with intermittent claudication (HCC) 03/01/2018   Atherosclerotic peripheral vascular disease with intermittent claudication (HCC) 01/03/2018   Benign essential HTN 12/07/2017   Bilateral carotid artery stenosis 12/07/2017   SOBOE (shortness of breath on exertion) 12/07/2017   Acute cholecystitis 07/24/2016   Coronary artery disease involving coronary bypass graft of native heart without angina pectoris    Tobacco abuse    PCP:  Osa Geralds, NP Pharmacy:   [facility] -  [place], [place] - [address] [place] [address] Phone: [phone] Fax: [phone]  [facility] [address], [place] - [address] [place] [address] Phone: [phone] Fax: [phone]  [facility] [address] [place] [address] Phone: [phone] Fax: [phone]     Social Drivers of Health (SDOH) Social History: SDOH Screenings   Food Insecurity: No Food Insecurity (08/13/2024)  Housing: Low Risk  (08/13/2024)  Transportation Needs: No Transportation Needs (08/13/2024)  Utilities: Not At Risk (08/13/2024)  Social Connections: Socially Isolated (08/13/2024)  Tobacco Use: High Risk (08/12/2024)   SDOH Interventions: Housing Interventions: Patient Declined   Readmission Risk Interventions    08/13/2024   12:01 PM 05/14/2024    8:44 AM 10/27/2022   11:28 AM  Readmission Risk Prevention Plan  Transportation Screening Complete  Complete  PCP or Specialist Appt within 5-7 Days   Complete  PCP or Specialist Appt within 3-5 Days  Complete   Home Care Screening   Complete  Medication Review (RN CM)   Complete  HRI or Home Care Consult  Complete   Social Work Consult for Recovery Care Planning/Counseling  Complete   Palliative Care Screening  Not Applicable   Medication Review Oceanographer) Complete Complete   Palliative Care Screening Not Applicable    Skilled Nursing Facility Complete

## 2024-08-13 NOTE — Assessment & Plan Note (Signed)
 No chest pain, history of recent NSTEMI. Troponin peaked at 888, likely secondary to demand ischemia with GI bleed. - Cardiology was consulted -Continue to monitor

## 2024-08-13 NOTE — Progress Notes (Signed)
   08/13/24 1755  Spiritual Encounters  Type of Visit Initial  Care provided to: Patient  Conversation partners present during encounter Other (comment) (RT)  Referral source Code page  Reason for visit Code  OnCall Visit Yes  Spiritual Framework  Presenting Themes Meaning/purpose/sources of inspiration;Values and beliefs;Coping tools;Impactful experiences and emotions;Other (comment) (Pt stated he's having trouble breathing and he's afraid he's going to die. Spent some time listening to see what he's afraid of. He said he always wanted just to die in his sleep but he doesn't want to suffer and struggling to breathe is scary)  Family Stress Factors Family relationships;Health changes  Interventions  Spiritual Care Interventions Made Established relationship of care and support;Compassionate presence;Reflective listening;Normalization of emotions;Narrative/life review;Explored values/beliefs/practices/strengths;Prayer;Encouragement  Intervention Outcomes  Outcomes Connection to spiritual care;Awareness around self/spiritual resourses;Awareness of health;Awareness of support;Reduced isolation;Other (comment) (Pt says he feels less anxious when someone is with him.)

## 2024-08-13 NOTE — Assessment & Plan Note (Signed)
 Patient uses 2 L of oxygen at baseline. - Continue with supplemental oxygen-wean to baseline as tolerated

## 2024-08-13 NOTE — Assessment & Plan Note (Signed)
 Patient has a scheduled outpatient appointment with Dr. Katina as he need evaluation for any possible procedure for severe aortic stenosis.

## 2024-08-13 NOTE — Consult Note (Signed)
 Ruel Kung , MD [address], [place], [place], [address] Phone: [phone] Fax: [phone]  Consultation  Referring Provider:   ER  Primary Care Physician:  Osa Geralds, NP Primary Gastroenterologist: [facility] - Dr Jinny      Reason for Consultation:     GI bleed   Date of Admission:  08/12/2024 Date of Consultation:  08/13/2024         HPI:   [name] is a 63 y.o. male with a history of severe aortic stenosis.  COPD,CVA,CAD, PCR on Plavix  , severe aortic stenosis . Recent NSTEMI in 06/2024.   Prior extensive endoscopic assesent which has been nagetiva. VCE showed AVMS that require retrograde endosocopy which we do not have. They are located 20 mins proximal to the IC valve.    He was discharged on 08/01/2019 after being admitted with COPD exacerbation respiratory failure requiring BiPAP and alcohol  withdrawal syndrome metabolic encephalopathy aspiration pneumonia requiring subsequent intubation had an episode of cardiac arrest developed subsequent heart failure had NSTEMI in July 2025 with drug-eluting stent placed.  Prior history of GI bleed normal colonoscopy in 2025 May and EGD in June 2024.  Previously in May 2024 had 2 AVMs that were treated with cautery.  Seen by GI in May 2025 with history of black stool.  During which she underwent a colonoscopy that showed only hemorrhoids but no active bleeding.  Presented to the hospital with productive cough , congestion -acute heart failure acute on chronic respiratory failure acute kidney injury and low hemoglobin. CT angiogram was negative for active bleeding.  On admission hemoglobin was 4.7 g with MCV of 93.5.  Troponin 793.   He says he has not noticed any blood in stool or overt blood loss. His main complaint presently is shortness of breath on minimal exertion . No abdominal pain .   Moderate blood in urine. Positive for COVID.  CXR COPD changes, small rt pleural effusion .   Past Medical  History:  Diagnosis Date   COPD (chronic obstructive pulmonary disease) (HCC)    CVA (cerebral infarction)    Headache    mild, since stroke 2012   Hypertension    MI (myocardial infarction) (HCC) 05/09/2012   Reading difficulty    pt reports he reads at about a second grade level   Stroke [facility]) 2012   numbness to left hand    Wears dentures    full upper and lower    Past Surgical History:  Procedure Laterality Date   CAROTID ENDARTERECTOMY Left    2012 or 2013   CHOLECYSTECTOMY N/A 07/26/2016   Procedure: LAPAROSCOPIC CHOLECYSTECTOMY;  Surgeon: Charlie FORBES Fell, MD;  Location: [facility] ORS;  Service: General;  Laterality: N/A;   COLONOSCOPY N/A 05/08/2024   Procedure: COLONOSCOPY;  Surgeon: Jinny Carmine, MD;  Location: [facility] ENDOSCOPY;  Service: Endoscopy;  Laterality: N/A;   COLONOSCOPY WITH PROPOFOL  N/A 02/09/2022   Procedure: COLONOSCOPY WITH PROPOFOL ;  Surgeon: Jinny Carmine, MD;  Location: [facility] ENDOSCOPY;  Service: Endoscopy;  Laterality: N/A;   CORONARY ARTERY BYPASS GRAFT  05/10/2012   3 vessel   CORONARY STENT INTERVENTION N/A 07/02/2024   Procedure: CORONARY STENT INTERVENTION;  Surgeon: Katina Albright, MD;  Location: [facility] INVASIVE CV LAB;  Service: Cardiovascular;  Laterality: N/A;   CORONARY ULTRASOUND/IVUS N/A 07/02/2024   Procedure: Coronary Ultrasound/IVUS;  Surgeon: Katina Albright, MD;  Location: [facility] INVASIVE CV LAB;  Service: Cardiovascular;  Laterality: N/A;   ENTEROSCOPY N/A 05/16/2023  Procedure: ENTEROSCOPY;  Surgeon: Therisa Bi, MD;  Location: [facility] ENDOSCOPY;  Service: Gastroenterology;  Laterality: N/A;   ENTEROSCOPY N/A 06/01/2023   Procedure: ENTEROSCOPY;  Surgeon: Unk Corinn Skiff, MD;  Location: [facility] ENDOSCOPY;  Service: Gastroenterology;  Laterality: N/A;   ESOPHAGOGASTRODUODENOSCOPY  05/13/2023   Procedure: ESOPHAGOGASTRODUODENOSCOPY (EGD);  Surgeon: Jinny Carmine, MD;  Location: [facility] ENDOSCOPY;  Service: Endoscopy;;   ESOPHAGOGASTRODUODENOSCOPY (EGD) WITH  PROPOFOL  N/A 10/28/2022   Procedure: ESOPHAGOGASTRODUODENOSCOPY (EGD) WITH PROPOFOL ;  Surgeon: Onita Elspeth Sharper, DO;  Location: [facility] ENDOSCOPY;  Service: Gastroenterology;  Laterality: N/A;   GIVENS CAPSULE STUDY  05/13/2023   Procedure: GIVENS CAPSULE STUDY;  Surgeon: Jinny Carmine, MD;  Location: [facility] ENDOSCOPY;  Service: Endoscopy;;   GIVENS CAPSULE STUDY  06/01/2023   Procedure: GIVENS CAPSULE STUDY;  Surgeon: Unk Corinn Skiff, MD;  Location: [facility] ENDOSCOPY;  Service: Gastroenterology;;   LEFT HEART CATH AND CORS/GRAFTS ANGIOGRAPHY N/A 07/02/2024   Procedure: LEFT HEART CATH AND CORS/GRAFTS ANGIOGRAPHY;  Surgeon: Katina Albright, MD;  Location: [facility] INVASIVE CV LAB;  Service: Cardiovascular;  Laterality: N/A;    Prior to Admission medications   Medication Sig Start Date End Date Taking? Authorizing Provider  albuterol  (VENTOLIN  HFA) 108 (90 Base) MCG/ACT inhaler Inhale 2 puffs into the lungs every 6 (six) hours as needed for wheezing or shortness of breath. 07/03/24  Yes Von Bellis, MD  amiodarone  (PACERONE ) 200 MG tablet Take 1 tablet (200 mg total) by mouth daily. 08/01/24  Yes Caleen Qualia, MD  ascorbic acid  (VITAMIN C ) 500 MG tablet Take 1 tablet (500 mg total) by mouth daily. 07/03/24 10/01/24 Yes Von Bellis, MD  aspirin  EC 81 MG tablet Take 1 tablet (81 mg total) by mouth daily. Swallow whole. 07/03/24 12/30/24 Yes Von Bellis, MD  budesonide  (PULMICORT ) 0.5 MG/2ML nebulizer solution Take 2 mLs (0.5 mg total) by nebulization 2 (two) times daily. 07/31/24  Yes Caleen Qualia, MD  clopidogrel  (PLAVIX ) 75 MG tablet Take 1 tablet (75 mg total) by mouth daily with breakfast. 07/03/24  Yes Von Bellis, MD  dapagliflozin  propanediol (FARXIGA ) 10 MG TABS tablet Take 1 tablet (10 mg total) by mouth daily. 07/03/24  Yes Von Bellis, MD  dextromethorphan -guaiFENesin  (MUCINEX  DM) 30-600 MG 12hr tablet Take 1 tablet by mouth 2 (two) times daily as needed for cough. 07/31/24  Yes Caleen Qualia, MD   folic acid  (FOLVITE ) 1 MG tablet Take 1 tablet (1 mg total) by mouth daily. 07/03/24 10/01/24 Yes Von Bellis, MD  Glycerin , Laxative, (GLYCERIN , ADULT,) 2 g SUPP Place 1 suppository rectally daily as needed for moderate constipation. 07/31/24  Yes Caleen Qualia, MD  HYDROcodone -acetaminophen  (NORCO/VICODIN) 5-325 MG tablet Take 1 tablet by mouth every 6 (six) hours as needed for moderate pain (pain score 4-6).   Yes [provider]  ipratropium-albuterol  (DUONEB) 0.5-2.5 (3) MG/3ML SOLN Take 3 mLs by nebulization 3 (three) times daily. 07/31/24  Yes Caleen Qualia, MD  iron  polysaccharides (NIFEREX) 150 MG capsule Take 1 capsule (150 mg total) by mouth daily. 07/03/24 10/01/24 Yes Von Bellis, MD  lactulose  (CHRONULAC ) 10 GM/15ML solution Take 30 mLs (20 g total) by mouth daily as needed for moderate constipation or mild constipation. 07/31/24  Yes Caleen Qualia, MD  lidocaine  4 % Place 1 patch onto the skin daily.   Yes [provider]  melatonin 5 MG TABS Place 1 tablet (5 mg total) into feeding tube at bedtime as needed. 07/31/24  Yes Amin, Sumayya, MD  Multiple Vitamin (MULTIVITAMIN WITH MINERALS) TABS tablet Take 1 tablet  by mouth daily. 08/01/24  Yes Caleen Qualia, MD  nicotine  (NICODERM CQ  - DOSED IN MG/24 HR) 7 mg/24hr patch Place 1 patch (7 mg total) onto the skin daily. 08/01/24  Yes Amin, Sumayya, MD  polyethylene glycol (MIRALAX  / GLYCOLAX ) 17 g packet Take 17 g by mouth at bedtime.   Yes [provider]  rosuvastatin  (CRESTOR ) 20 MG tablet Take 1 tablet (20 mg total) by mouth daily. 08/01/24  Yes Caleen Qualia, MD  senna-docusate (SENOKOT-S) 8.6-50 MG tablet Take 2 tablets by mouth at bedtime. 07/31/24  Yes Caleen Qualia, MD  sodium chloride  (OCEAN) 0.65 % SOLN nasal spray Place 1 spray into both nostrils as needed for congestion. 07/31/24  Yes Caleen Qualia, MD  thiamine  (VITAMIN B-1) 100 MG tablet Take 1 tablet (100 mg total) by mouth daily. 08/01/24  Yes Caleen Qualia, MD  torsemide  (DEMADEX ) 20 MG tablet Take 1 tablet (20 mg total) by mouth daily. 08/01/24  Yes Caleen Qualia, MD  traZODone  (DESYREL ) 50 MG tablet Take 50 mg by mouth at bedtime.   Yes [provider]  Vitamin D , Ergocalciferol , (DRISDOL ) 1.25 MG (50000 UNIT) CAPS capsule Take 1 capsule (50,000 Units total) by mouth every 7 (seven) days. 08/02/24  Yes Caleen Qualia, MD  feeding supplement (ENSURE PLUS HIGH PROTEIN) LIQD Take 237 mLs by mouth 3 (three) times daily between meals. 07/31/24   Caleen Qualia, MD    Family History  Problem Relation Age of Onset   Pneumonia Mother      Social History   Tobacco Use   Smoking status: Former    Current packs/day: 0.00    Types: Cigarettes    Quit date: 1993    Years since quitting: 32.6   Smokeless tobacco: Current    Types: Snuff   Tobacco comments:    changed to dip  Vaping Use   Vaping status: Never Used  Substance Use Topics   Alcohol  use: Yes    Alcohol /week: 26.0 standard drinks of alcohol     Types: 26 Cans of beer per week   Drug use: No    Allergies as of 08/12/2024 - Review Complete 08/12/2024  Allergen Reaction Noted   Wellbutrin  [bupropion ]  06/13/2024    Review of Systems:    All systems reviewed and negative except where noted in HPI.   Physical Exam:  Vital signs in last 24 hours: Temp:  [98 F (36.7 C)-98.8 F (37.1 C)] 98.4 F (36.9 C) (08/27 0456) Pulse Rate:  [84-101] 101 (08/27 0700) Resp:  [13-25] 25 (08/27 0700) BP: (80-128)/(50-87) 122/78 (08/27 0700) SpO2:  [97 %-100 %] 100 % (08/27 0700) Weight:  [95.3 kg-97.9 kg] 95.3 kg (08/26 1843) Last BM Date : 08/12/24 General:   Pleasant, cooperative in NAD Head:  Normocephalic and atraumatic. Eyes:   No icterus.   Conjunctiva pink. PERRLA. Abdomen:  Soft, nondistended, nontender. Normal bowel sounds. No appreciable masses or hepatomegaly.  No rebound or guarding.  Neurologic:  Alert and oriented x3;  grossly normal neurologically. Skin:   Intact without significant lesions or rashes. Cervical Nodes:  No significant cervical adenopathy. Psych:  Alert and cooperative. Normal affect.  LAB RESULTS: Recent Labs    08/12/24 1457 08/12/24 2101 08/13/24 0401  WBC 12.2* 14.5* 13.0*  HGB 4.7* 9.3* 8.3*  HCT 15.8* 28.4* 25.3*  PLT 330 307 302   BMET Recent Labs    08/12/24 1457 08/13/24 0401  NA 133* 134*  K 3.7 3.5  CL 94* 101  CO2 26  26  GLUCOSE 133* 107*  BUN 33* 28*  CREATININE 1.68* 1.56*  CALCIUM  8.4* 7.8*   LFT Recent Labs    08/13/24 0401  PROT 6.6  ALBUMIN  2.9*  AST 19  ALT 12  ALKPHOS 50  BILITOT 1.3*   PT/INR Recent Labs    08/12/24 1457  LABPROT 15.8*  INR 1.2    STUDIES: DG Chest Portable 1 View Result Date: 08/12/2024 CLINICAL DATA:  Syncopal episode. Septic shock. Clinical concern for pneumonia. EXAM: PORTABLE CHEST 1 VIEW COMPARISON:  07/29/2024. Abdomen and pelvis CTA dated 08/12/2024. Chest CTA dated 07/11/2024. FINDINGS: Interval enlarged cardiac silhouette. Stable post CABG changes. The lungs are hyperexpanded with mildly prominent upper lung zone pulmonary vasculature. Small right pleural effusion. Biapical bullous changes. Unremarkable bones. IMPRESSION: 1. Interval cardiomegaly and mild pulmonary vascular congestion. 2. Small right pleural effusion. 3. COPD with biapical bullous changes. Electronically Signed   By: Elspeth Bathe M.D.   On: 08/12/2024 16:58   CT ANGIO GI BLEED Result Date: 08/12/2024 CLINICAL DATA:  Anemia, syncope and melena. EXAM: CTA ABDOMEN AND PELVIS WITHOUT AND WITH CONTRAST TECHNIQUE: Multidetector CT imaging of the abdomen and pelvis was performed using the standard protocol during bolus administration of intravenous contrast. Multiplanar reconstructed images and MIPs were obtained and reviewed to evaluate the vascular anatomy. RADIATION DOSE REDUCTION: This exam was performed according to the departmental dose-optimization program which includes automated  exposure control, adjustment of the mA and/or kV according to patient size and/or use of iterative reconstruction technique. CONTRAST:  OMNIPAQUE  IOHEXOL  350 MG/ML SOLN COMPARISON:  CT abdomen and pelvis 03/24/2016 FINDINGS: VASCULAR Aorta: Atherosclerosis of the abdominal aorta without aneurysm. Ulcerated plaque of the anterior infrarenal segment. Celiac: Calcified plaque causes approximately 50-60% origin stenosis. Distal branches are normally patent. SMA: Atherosclerosis of SMA trunk without significant stenosis. Renals: Heavily calcified plaque in proximal segments bilateral single renal arteries causes moderate stenoses of approximately 50-70%. The left renal artery almost immediately bifurcates beyond its origin. IMA: Patent small caliber IMA. Inflow: Heavily calcified bilateral iliac arteries with diffuse disease and no aneurysmal disease or obstruction. Proximal Outflow: Calcified common femoral arteries. Probable significant stenosis at the origin of the right SFA. Veins: Venous phase imaging demonstrates patent venous structures in the abdomen and pelvis without thrombus or other abnormalities. Review of the MIP images confirms the above findings. NON-VASCULAR Lower chest: No acute abnormality. Hepatobiliary: Potential early cirrhotic morphology of the liver. No hepatic masses identified. Status post cholecystectomy. No biliary dilatation. Pancreas: Unremarkable. No pancreatic ductal dilatation or surrounding inflammatory changes. Spleen: Normal in size without focal abnormality. Adrenals/Urinary Tract: Adrenal glands are unremarkable. Kidneys are normal, without renal calculi, focal lesion, or hydronephrosis. Bladder is unremarkable. Stomach/Bowel: Speckled high density material in the stomach is present on imaging prior to contrast administration. There is no evidence active bleeding on arterial or venous phases of imaging within the gastrointestinal tract. Bowel shows no evidence of obstruction,  ileus, inflammation or lesion. The appendix is not discretely visualized. No free intraperitoneal air. Lymphatic: No enlarged abdominal or pelvic lymph nodes. Reproductive: Prostate is unremarkable. Other: No abdominal wall hernia or abnormality. No abdominopelvic ascites. Musculoskeletal: Mild loss of height of the T12 vertebral body. IMPRESSION: 1. No evidence of active bleeding within the gastrointestinal tract. 2. Atherosclerosis of the abdominal aorta and iliac arteries without aneurysm. Ulcerated plaque of the anterior infrarenal aorta. 3. 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 4. Moderate stenoses of approximately 50-70% in proximal bilateral renal arteries. 5.  Probable significant stenosis at the origin of the right SFA. 6. Potential early cirrhotic morphology of the liver. 7. Mild loss of height of the T12 vertebral body. Aortic Atherosclerosis (ICD10-I70.0). Electronically Signed   By: Marcey Moan M.D.   On: 08/12/2024 16:21      Impression / Plan:   Jaleal Schliep is a 63 y.o. 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  Recent colonoscopy in May 2025 showed no bleeding.  Last upper endoscopy was in 2024 jejunal AVMs were ablated.  Capsule study has shown AVMs in the distal small bowel which is not amenable for treatment at [facility] as we do not have the equipment to reach it.  Recently admitted to the hospital over the past few months on 2 occasions for an NSTEMI in July and discharged just 2 weeks back with acute respiratory failure complicated by cardiac arrest requiring intubation.  He has been admitted on this occasion with acute respiratory failure likely secondary to COVID, heart failure, acute kidney injury and found to have a hemoglobin of 4.5 g.  I have been consulted for the blood loss anemia.   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  The definitive treatment is to fix the aortic stenosis.  Short of that we can go and try and fix the AVMs in the small bowel with the balloon enteroscopy which needs to be performed at [facility] or a [facility] since we do not have the equipment here.  With recent NSTEMI, acute respiratory failure on this admission, recent cardiac arrest , acute COVID, elevated troponin, severe aortic stenosis he would be extremely high risk for anesthesia and any endoscopy intervention.  My recommendation would be to perform any endoscopic intervention only if his hemorrhaging and it would be the only option to save his life.  It is a balance between risk versus benefit ratio that would determine any endoscopy .    From the non endoscopy point of view for management would recommend to monitor CBC and transfuse as needed.  PPI.  Consider Sandostatin shots as an outpatient if there is no contraindication for the cardiac standpoint.  Other outpatient options include thalidomide.  Other options would include performing a capsule study when he is doing a bit better from the respiratory point of view to rule out any other AVMs which could potentially have bled in the proximal small bowel that may be amenable to endoscopy provided his heart is able to tolerate anesthesia from the cardiac standpoint.  Discussed plan with Dr Caleen   Thank you for involving me in the care of this patient.      LOS: 1 day   Ruel Kung, MD  08/13/2024, 8:10 AM

## 2024-08-13 NOTE — Assessment & Plan Note (Signed)
 Patient with history of NSTEMI s/p PCI in July 2025 Troponin elevated, likely secondary to demand with GI bleed. Holding aspirin  and Plavix  Cardiology was consulted Continue with statin

## 2024-08-13 NOTE — Progress Notes (Signed)
 PHARMACY CONSULT NOTE - FOLLOW UP  Pharmacy Consult for Electrolyte Monitoring and Replacement   Recent Labs: Potassium (mmol/L)  Date Value  08/13/2024 3.5  12/02/2014 3.7   Magnesium  (mg/dL)  Date Value  91/72/7974 2.1   Calcium  (mg/dL)  Date Value  91/72/7974 7.8 (L)   Calcium , Total (mg/dL)  Date Value  87/83/7984 8.4 (L)   Albumin  (g/dL)  Date Value  91/72/7974 2.9 (L)  09/09/2014 3.8   Phosphorus (mg/dL)  Date Value  91/72/7974 4.3   Sodium (mmol/L)  Date Value  08/13/2024 134 (L)  12/02/2014 134 (L)     Assessment: 63 y.o. male with medical history significant of  GIB, alcohol  abuse, COPD on 2 L oxygen, hypertension, CAD with prior CABG and recent stent placement, HLD, stroke, dementia, dCHF, presents from [facility] via EMS following a syncopal episode. It was reported pt has had blood stools for the past month.   Goal of Therapy:  Electrolytes WNL  Plan:  ---20 mEq po KCl x 1 ---recheck electrolytes in am  Adriana JONETTA Bolster ,PharmD Clinical Pharmacist 08/13/2024 11:59 AM

## 2024-08-13 NOTE — Evaluation (Signed)
 Physical Therapy Evaluation Patient Details Name: [name] MRN: [id number] DOB: [birth year] Today's Date: 08/13/2024  History of Present Illness  63 y.o. male with medical history significant of  GIB, alcohol  abuse, COPD on 2 L oxygen, hypertension, CAD with prior CABG and recent Stent placement, HLD, stroke, dementia, dCHF, presents with SOB, cough, chest pain.     Patient was recently hospitalized from 7/25 - 8/14 and d/c'd to rehab facility.  Returns after syncopal epsisode with weakness and GI bleed - recieved blood, found to be Covid +  Clinical Impression  Pt pleasant and willing to work with PT, eager to see what he could do.  Ultimately he moved relatively well and showed functional strength, etc but was very quick to fatigue.  Pt with significant weakness, shortness of breath and need for rest break after modest effort.  Pt will benefit from continued PT to address functional limitations.        If plan is discharge home, recommend the following: A little help with walking and/or transfers;A lot of help with bathing/dressing/bathroom;Assistance with cooking/housework;Assist for transportation;Help with stairs or ramp for entrance   Can travel by private vehicle   No    Equipment Recommendations Rolling walker (2 wheels)  Recommendations for Other Services       Functional Status Assessment Patient has had a recent decline in their functional status and demonstrates the ability to make significant improvements in function in a reasonable and predictable amount of time.     Precautions / Restrictions Precautions Precautions: Fall Recall of Precautions/Restrictions: Intact Restrictions Weight Bearing Restrictions Per Provider Order: No      Mobility  Bed Mobility Overal bed mobility: Needs Assistance Bed Mobility: Sit to Supine       Sit to supine: Supervision   General bed mobility comments: able to get LEs up into bed w/o issue    Transfers Overall  transfer level: Needs assistance Equipment used: Rolling walker (2 wheels) Transfers: Sit to/from Stand Sit to Stand: Supervision           General transfer comment: able to rise from recliner with relative ease, appropriate UE use    Ambulation/Gait Ambulation/Gait assistance: Contact guard assist Gait Distance (Feet): 35 Feet Assistive device: Rolling walker (2 wheels)         General Gait Details: Pt able to do some limited in-room ambulation but ultimately was very quick to fatigue.  Despite SpO2 readings in the high 90s (on baseline 2L O2) he was extremely short of breath with this modest effort and needed extensive (<5 minutes sitting EOB with focused breathing) rest break before trying any other activity.  Stairs            Wheelchair Mobility     Tilt Bed    Modified Rankin (Stroke Patients Only)       Balance Overall balance assessment: Needs assistance Sitting-balance support: Feet supported Sitting balance-Leahy Scale: Good     Standing balance support: During functional activity, Bilateral upper extremity supported Standing balance-Leahy Scale: Fair Standing balance comment: no LOBs with walker, needed to abruptly sit and did not control descent well                             Pertinent Vitals/Pain Pain Assessment Pain Assessment: 0-10 Pain Score: 5  Pain Location: abdominal/gas pain    Home Living Family/patient expects to be discharged to:: Private residence Living Arrangements: Alone Available Help at Discharge:  Friend(s);Available PRN/intermittently Type of Home: House Home Access: Level entry       Home Layout: Two level;Able to live on main level with bedroom/bathroom Home Equipment: Rolling Walker (2 wheels);BSC/3in1;Shower seat Additional Comments: Pt reports he rents a guest house on his landlord's property, has been living there 10+ years.    Prior Function Prior Level of Function : Independent/Modified  Independent;Needs assist             Mobility Comments: pt reports multiple falls in the last 6 months.  Out of home muliple times/week for meals, errands       Extremity/Trunk Assessment   Upper Extremity Assessment Upper Extremity Assessment: Generalized weakness;Overall WFL for tasks assessed RUE Deficits / Details: R grossly 4-/5, L grossly 4/5    Lower Extremity Assessment Lower Extremity Assessment: Generalized weakness       Communication   Communication Communication: Impaired Factors Affecting Communication: Hearing impaired    Cognition Arousal: Alert Behavior During Therapy: WFL for tasks assessed/performed   PT - Cognitive impairments: Problem solving, No apparent impairments                       PT - Cognition Comments: delayed response at times but overall WFL Following commands: Intact Following commands impaired: Follows one step commands with increased time     Cueing Cueing Techniques: Verbal cues     General Comments General comments (skin integrity, edema, etc.): Pt voiced strong desire to go home (not rehab)    Exercises     Assessment/Plan    PT Assessment Patient needs continued PT services  PT Problem List Decreased strength;Decreased range of motion;Decreased activity tolerance;Decreased balance;Decreased mobility;Decreased coordination;Decreased safety awareness;Cardiopulmonary status limiting activity;Decreased cognition       PT Treatment Interventions Gait training;Functional mobility training;Therapeutic activities;Therapeutic exercise;Balance training;Patient/family education;DME instruction;Neuromuscular re-education    PT Goals (Current goals can be found in the Care Plan section)  Acute Rehab PT Goals Patient Stated Goal: to get stronger PT Goal Formulation: With patient Time For Goal Achievement: 08/26/24 Potential to Achieve Goals: Fair    Frequency Min 2X/week     Co-evaluation                AM-PAC PT 6 Clicks Mobility  Outcome Measure Help needed turning from your back to your side while in a flat bed without using bedrails?: A Little Help needed moving from lying on your back to sitting on the side of a flat bed without using bedrails?: A Little Help needed moving to and from a bed to a chair (including a wheelchair)?: A Little Help needed standing up from a chair using your arms (e.g., wheelchair or bedside chair)?: A Little Help needed to walk in hospital room?: A Little Help needed climbing 3-5 steps with a railing? : A Lot 6 Click Score: 17    End of Session Equipment Utilized During Treatment: Oxygen (2L) Activity Tolerance: Patient limited by fatigue Patient left: with bed alarm set;with call bell/phone within reach Nurse Communication: Mobility status PT Visit Diagnosis: Muscle weakness (generalized) (M62.81);Difficulty in walking, not elsewhere classified (R26.2);Unsteadiness on feet (R26.81)    Time: 8647-8572 PT Time Calculation (min) (ACUTE ONLY): 35 min   Charges:   PT Evaluation $PT Eval Low Complexity: 1 Low PT Treatments $Therapeutic Activity: 8-22 mins PT General Charges $$ ACUTE PT VISIT: 1 Visit         Carmin JONELLE Deed, DPT 08/13/2024, 3:26 PM

## 2024-08-13 NOTE — Assessment & Plan Note (Signed)
 Initial concern of hemorrhagic shock with significant hypotension so he was admitted in ICU. Levophed  was ordered but never given. - Blood pressure now within goal -Continue to monitor -Supportive care

## 2024-08-13 NOTE — Assessment & Plan Note (Signed)
 Patient tested positive for COVID and MRSA PCR. Worsening shortness of breath and congestion.  Chest x-ray with concern of some congestion? Worsening leukocytosis. Patient is high risk for any superadded bacterial infection due to significant underlying comorbidities and debility. - Check procalcitonin -Starting on cefepime  and vancomycin  -Ordered Solu-Medrol 

## 2024-08-13 NOTE — Progress Notes (Signed)
 Mandy with Lab called to report that patient was positive for MRSA in nares and positive for COVID. Made MD aware.

## 2024-08-13 NOTE — Assessment & Plan Note (Signed)
 Acute blood loss anemia. 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  Discussed with GI, unfortunately taking care of those AVMs are beyond the scope of this hospital and they have suggested in past going to a tertiary care center for more specialized management. Patient came with hemoglobin of 4.7 s/p 2 unit of PRBC and 1 unit of platelets and now hemoglobin is 8.3.  Did not had any more bloody bowel movement since in the hospital. - Holding aspirin  and Plavix  for another day per GI -Monitor for another bleeding - Monitor hemoglobin - Transfuse if below 8

## 2024-08-13 NOTE — Progress Notes (Signed)
 Pharmacy Antibiotic Note  [name] is a 63 y.o. male PMH multiple medical comorbidities including COPD on 2 L nasal cannula, CAD with prior CABG and recent stent placement, HLD, stroke, dementia, diastolic CHF, prior GI bleed not on anticoagulation (was on dual antiplatelet therapy) admitted on 08/12/2024 with hemorrhagic shock and pneumonia.  Pharmacy has been consulted for cefepime  and vancomycin  dosing. Serum creatinine is notably elevated but near approximate baseline  Plan:  1) start cefepime  2 grams IV every 12 hours  2) start vancomycin  2000 mg IV x 1, then 1500 mg IV every 24 hours Goal AUC 400-550. Expected AUC: 462.6 SCr used: 1.56 mg/dL   Height: 6' (817.0 cm) Weight: 95.3 kg (210 lb 1.6 oz) IBW/kg (Calculated) : 77.6  Temp (24hrs), Avg:98.3 F (36.8 C), Min:97.9 F (36.6 C), Max:98.8 F (37.1 C)  Recent Labs  Lab 08/12/24 1457 08/12/24 1657 08/12/24 2101 08/13/24 0401 08/13/24 0856  WBC 12.2*  --  14.5* 13.0* 15.0*  CREATININE 1.68*  --   --  1.56*  --   LATICACIDVEN 3.2* 2.4*  --   --   --     Estimated Creatinine Clearance: 56.6 mL/min (A) (by C-G formula based on SCr of 1.56 mg/dL (H)).    Allergies  Allergen Reactions   Wellbutrin  [Bupropion ]     Paranoia     Antimicrobials this admission: 08/27 vancomycin  >>  08/27 cefepime  >>   Microbiology results: 08/26 BCx: pending 08/27 MRSA PCR: positive 08/27 SARS CoV-2: positive  Thank you for allowing pharmacy to be a part of this patient's care.  Adriana JONETTA Bolster 08/13/2024 2:52 PM

## 2024-08-13 NOTE — Hospital Course (Addendum)
 Partly taken from prior notes.  [name] is a 63 y.o. male PMH multiple medical comorbidities including COPD on 2 L nasal cannula, CAD with prior CABG and recent stent placement, HLD, stroke, dementia, diastolic CHF, prior GI bleed not on anticoagulation (was on dual antiplatelet therapy) presents for evaluation of reported syncopal episode, apparently patient has a bloody bowel movement.  Pt has a hx of AVM's undergone multiple bidrectional endoscopies without findings of bleeding within the last 2 yrs.   He was recently discharged from [facility] following a prolonged hospitalization 07/26-08/14 following treatment of ETOH withdrawal requiring mechanical intubation due to worsening acute respiratory failure secondary to pneumonia due to recurrent aspiration along with acute on chronic diastolic CHF and cardiac arrest.  He was discharged to [facility].   On presentation patient was hypotensive in 70s, labs with sodium of 133, BUN 33, creatinine 1.68, BNP 1514, troponin 888, lactic acid 3.2, leukocytosis at 12.2 and hemoglobin of 4.7. Chest x-ray concerning for pulmonary vascular congestion and small pleural effusion.  Patient was initially admitted in ICU for concern of hemorrhagic shock secondary to acute GI bleed.  GI was consulted. 2 unit PRBC ordered.  Home aspirin  and Plavix  was held.  8/27: Vital stable, did not require any pressors, hemoglobin improved to 8.3, WBC 15, sodium 134, BUN 28, creatinine 1.56, T. bili at 1.3, MRSA PCR positive, respiratory panel positive for COVID.  Patient received 2 unit of PRBC and 1 unit of platelets. Troponin peaked at 888.  Adding steroid and increasing the frequency of DuoNeb due to developing wheezing and shortness of breath.  Starting on cefepime  and vancomycin  while checking procalcitonin due to worsening leukocytosis and shortness of breath.  Patient with significant underlying comorbidities and high risk for mortality, palliative care was also  consulted.  8/28: Hemodynamically stable, he was placed on heated high flow overnight, now weaned back to baseline O2 of 2 L. No more bloody bowel movement but hemoglobin decreased to 8 today.  Improving creatinine.  Procalcitonin was negative so antibiotics were discontinued.  8/29: Hemodynamically stable, on 2 L of oxygen.  Hemoglobin with further decreased to 8 today.  Giving 1 unit of PRBC due to recent NSTEMI.  Restarting aspirin  and Plavix  as there is no active bleeding but he did not had any bowel movement since in the hospital-working on bowel regimen.  8/30.  Hemoglobin 9.4.  Patient complains of shortness of breath today.  Will give a dose of Lasix  and chest x-ray is negative except for nodular opacity right upper lung seen on prior CT scan. 8/31.  Patient was tapered off oxygen today.  Hemoglobin 9.8, creatinine 1.37 9/1.  Patient states he chronically wears 2 L of oxygen.  Continue to taper prednisone .  Hemoglobin 9.5.  Will give IV Venofer . 9/2.  Patient's hemoglobin 9.7.  Patient felt like he was ready to go home.  I was going to discharge him home but apparently the landlord changed his locks.  TOC reached out to [facility] and they can take him back but waiting to get confirmation on whether they can bring him back to the outpatient appointment with Dr. Katina (valve specialist).  Will keep patient here and spoke with cardiology about Dr. Katina seeing him while here.

## 2024-08-13 NOTE — Progress Notes (Signed)
 Progress Note   Patient: [name] FMW:[id number] DOB: 06/13/1961 DOA: 08/12/2024     1 DOS: the patient was seen and examined on 08/13/2024   Brief hospital course: Partly taken from prior notes.  [name] is a 63 y.o. male PMH multiple medical comorbidities including COPD on 2 L nasal cannula, CAD with prior CABG and recent stent placement, HLD, stroke, dementia, diastolic CHF, prior GI bleed not on anticoagulation (was on dual antiplatelet therapy) presents for evaluation of reported syncopal episode, apparently patient has a bloody bowel movement.  Pt has a hx of AVM's undergone multiple bidrectional endoscopies without findings of bleeding within the last 2 yrs.   He was recently discharged from [facility] following a prolonged hospitalization 07/26-08/14 following treatment of ETOH withdrawal requiring mechanical intubation due to worsening acute respiratory failure secondary to pneumonia due to recurrent aspiration along with acute on chronic diastolic CHF and cardiac arrest.  He was discharged to [facility].   On presentation patient was hypotensive in 70s, labs with sodium of 133, BUN 33, creatinine 1.68, BNP 1514, troponin 888, lactic acid 3.2, leukocytosis at 12.2 and hemoglobin of 4.7. Chest x-ray concerning for pulmonary vascular congestion and small pleural effusion.  Patient was initially admitted in ICU for concern of hemorrhagic shock secondary to acute GI bleed.  GI was consulted. 2 unit PRBC ordered.  Home aspirin  and Plavix  was held.  8/27: Vital stable, did not require any pressors, hemoglobin improved to 8.3, WBC 15, sodium 134, BUN 28, creatinine 1.56, T. bili at 1.3, MRSA PCR positive, respiratory panel positive for COVID.  Patient received 2 unit of PRBC and 1 unit of platelets. Troponin peaked at 888.  Adding steroid and increasing the frequency of DuoNeb due to developing wheezing and shortness of breath.  Starting on cefepime  and vancomycin  while  checking procalcitonin due to worsening leukocytosis and shortness of breath.  Patient with significant underlying comorbidities and high risk for mortality, palliative care was also consulted.  Assessment and Plan: * GI bleed Acute blood loss anemia. 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  Discussed with GI, unfortunately taking care of those AVMs are beyond the scope of this hospital and they have suggested in past going to a tertiary care center for more specialized management. Patient came with hemoglobin of 4.7 s/p 2 unit of PRBC and 1 unit of platelets and now hemoglobin is 8.3.  Did not had any more bloody bowel movement since in the hospital. - Holding aspirin  and Plavix  for another day per GI -Monitor for another bleeding - Monitor hemoglobin - Transfuse if below 8    Hemorrhagic shock (HCC) Initial concern of hemorrhagic shock with significant hypotension so he was admitted in ICU. Levophed  was ordered but never given. - Blood pressure now within goal -Continue to monitor -Supportive care  COVID-19 virus infection Patient tested positive for COVID and MRSA PCR. Worsening shortness of breath and congestion.  Chest x-ray with concern of some congestion? Worsening leukocytosis. Patient is high risk for any superadded bacterial infection due to significant underlying comorbidities and debility. - Check procalcitonin -Starting on cefepime  and vancomycin  -Ordered Solu-Medrol   COPD with acute exacerbation (HCC) Likely due to COVID infection.  Developed significant congestion and wheeze. - Ordered Solu-Medrol  -DuoNeb every 4 hourly -Continue supportive care  Elevated troponin No chest pain, history of recent NSTEMI. Troponin peaked at 888, likely secondary to demand ischemia with GI bleed. - Cardiology was consulted -Continue to  monitor  History of CAD (coronary artery disease) Patient with history of NSTEMI s/p PCI in July  2025 Troponin elevated, likely secondary to demand with GI bleed. Holding aspirin  and Plavix  Cardiology was consulted Continue with statin  Acute on chronic hypoxic respiratory failure (HCC) Patient uses 2 L of oxygen at baseline. - Continue with supplemental oxygen-wean to baseline as tolerated  Severe aortic stenosis Patient has a scheduled outpatient appointment with Dr. Katina as he need evaluation for any possible procedure for severe aortic stenosis.   Subjective: Patient was feeling quite short of breath and congested when seen today.  Keeps saying that he cannot breathe properly.  Having some intermittent chest pain which seems pleuritic as it increased with deep breathing.  Did not had any more bloody bowel movement.  Having some intermittent abdominal pain.  Physical Exam: Vitals:   08/13/24 1345 08/13/24 1400 08/13/24 1415 08/13/24 1430  BP:  111/61    Pulse:   (!) 109 (!) 107  Resp: 19 (!) 22 17 (!) 23  Temp:      TempSrc:      SpO2:   94% 100%  Weight:      Height:       General.  Chronically ill-appearing, frail gentleman, in no acute distress. Pulmonary.  Some scattered wheeze bilaterally, mildly increased work of breathing CV.  Regular rate and rhythm, no JVD, rub or murmur. Abdomen.  Soft, nontender, nondistended, BS positive. CNS.  Alert and oriented .  No focal neurologic deficit. Extremities.  No edema, pulses intact and symmetrical. Psychiatry.  Judgment and insight appears normal.   Data Reviewed: Prior data reviewed  Family Communication: Discussed with patient  Disposition: Status is: Inpatient Remains inpatient appropriate because: Severity of illness  Planned Discharge Destination: Skilled nursing facility  DVT prophylaxis.  SCDs Time spent: 55 minutes  This record has been created using Conservation officer, historic buildings. Errors have been sought and corrected,but may not always be located. Such creation errors do not reflect on the standard of  care.   Author: Amaryllis Dare, MD 08/13/2024 3:12 PM  For on call review [email or web address].

## 2024-08-14 DIAGNOSIS — K31811 Angiodysplasia of stomach and duodenum with bleeding: Secondary | ICD-10-CM | POA: Diagnosis not present

## 2024-08-14 DIAGNOSIS — I214 Non-ST elevation (NSTEMI) myocardial infarction: Secondary | ICD-10-CM

## 2024-08-14 DIAGNOSIS — J9621 Acute and chronic respiratory failure with hypoxia: Secondary | ICD-10-CM | POA: Diagnosis not present

## 2024-08-14 DIAGNOSIS — Z515 Encounter for palliative care: Secondary | ICD-10-CM

## 2024-08-14 DIAGNOSIS — U071 COVID-19: Secondary | ICD-10-CM | POA: Diagnosis not present

## 2024-08-14 DIAGNOSIS — J441 Chronic obstructive pulmonary disease with (acute) exacerbation: Secondary | ICD-10-CM | POA: Diagnosis not present

## 2024-08-14 DIAGNOSIS — R578 Other shock: Secondary | ICD-10-CM | POA: Diagnosis not present

## 2024-08-14 LAB — CBC WITH DIFFERENTIAL/PLATELET
Abs Immature Granulocytes: 0.02 K/uL (ref 0.00–0.07)
Abs Immature Granulocytes: 0.05 K/uL (ref 0.00–0.07)
Abs Immature Granulocytes: 0.17 K/uL — ABNORMAL HIGH (ref 0.00–0.07)
Basophils Absolute: 0 K/uL (ref 0.0–0.1)
Basophils Absolute: 0 K/uL (ref 0.0–0.1)
Basophils Absolute: 0 K/uL (ref 0.0–0.1)
Basophils Relative: 0 %
Basophils Relative: 0 %
Basophils Relative: 0 %
Eosinophils Absolute: 0 K/uL (ref 0.0–0.5)
Eosinophils Absolute: 0 K/uL (ref 0.0–0.5)
Eosinophils Absolute: 0 K/uL (ref 0.0–0.5)
Eosinophils Relative: 0 %
Eosinophils Relative: 0 %
Eosinophils Relative: 0 %
HCT: 25 % — ABNORMAL LOW (ref 39.0–52.0)
HCT: 27.1 % — ABNORMAL LOW (ref 39.0–52.0)
HCT: 27.5 % — ABNORMAL LOW (ref 39.0–52.0)
Hemoglobin: 8 g/dL — ABNORMAL LOW (ref 13.0–17.0)
Hemoglobin: 8.5 g/dL — ABNORMAL LOW (ref 13.0–17.0)
Hemoglobin: 8.8 g/dL — ABNORMAL LOW (ref 13.0–17.0)
Immature Granulocytes: 0 %
Immature Granulocytes: 0 %
Immature Granulocytes: 1 %
Lymphocytes Relative: 9 %
Lymphocytes Relative: 4 %
Lymphocytes Relative: 6 %
Lymphs Abs: 0.6 K/uL — ABNORMAL LOW (ref 0.7–4.0)
Lymphs Abs: 0.5 K/uL — ABNORMAL LOW (ref 0.7–4.0)
Lymphs Abs: 0.9 K/uL (ref 0.7–4.0)
MCH: 28.4 pg (ref 26.0–34.0)
MCH: 28.2 pg (ref 26.0–34.0)
MCH: 28.1 pg (ref 26.0–34.0)
MCHC: 32 g/dL (ref 30.0–36.0)
MCHC: 31.4 g/dL (ref 30.0–36.0)
MCHC: 32 g/dL (ref 30.0–36.0)
MCV: 88.7 fL (ref 80.0–100.0)
MCV: 90 fL (ref 80.0–100.0)
MCV: 87.9 fL (ref 80.0–100.0)
Monocytes Absolute: 0.2 K/uL (ref 0.1–1.0)
Monocytes Absolute: 0.4 K/uL (ref 0.1–1.0)
Monocytes Absolute: 1.2 K/uL — ABNORMAL HIGH (ref 0.1–1.0)
Monocytes Relative: 3 %
Monocytes Relative: 4 %
Monocytes Relative: 8 %
Neutro Abs: 6.2 K/uL (ref 1.7–7.7)
Neutro Abs: 10.5 K/uL — ABNORMAL HIGH (ref 1.7–7.7)
Neutro Abs: 13.3 K/uL — ABNORMAL HIGH (ref 1.7–7.7)
Neutrophils Relative %: 88 %
Neutrophils Relative %: 92 %
Neutrophils Relative %: 85 %
Platelets: 264 K/uL (ref 150–400)
Platelets: 303 K/uL (ref 150–400)
Platelets: 263 K/uL (ref 150–400)
RBC: 2.82 MIL/uL — ABNORMAL LOW (ref 4.22–5.81)
RBC: 3.01 MIL/uL — ABNORMAL LOW (ref 4.22–5.81)
RBC: 3.13 MIL/uL — ABNORMAL LOW (ref 4.22–5.81)
RDW: 16 % — ABNORMAL HIGH (ref 11.5–15.5)
RDW: 16.2 % — ABNORMAL HIGH (ref 11.5–15.5)
RDW: 16.1 % — ABNORMAL HIGH (ref 11.5–15.5)
WBC: 7.1 K/uL (ref 4.0–10.5)
WBC: 11.5 K/uL — ABNORMAL HIGH (ref 4.0–10.5)
WBC: 15.6 K/uL — ABNORMAL HIGH (ref 4.0–10.5)
nRBC: 0 % (ref 0.0–0.2)
nRBC: 0 % (ref 0.0–0.2)
nRBC: 0 % (ref 0.0–0.2)

## 2024-08-14 LAB — BASIC METABOLIC PANEL WITH GFR
Anion gap: 10 (ref 5–15)
BUN: 27 mg/dL — ABNORMAL HIGH (ref 8–23)
CO2: 26 mmol/L (ref 22–32)
Calcium: 8.3 mg/dL — ABNORMAL LOW (ref 8.9–10.3)
Chloride: 99 mmol/L (ref 98–111)
Creatinine, Ser: 1.37 mg/dL — ABNORMAL HIGH (ref 0.61–1.24)
GFR, Estimated: 57 mL/min — ABNORMAL LOW (ref >60)
Glucose, Bld: 164 mg/dL — ABNORMAL HIGH (ref 70–99)
Potassium: 4.8 mmol/L (ref 3.5–5.1)
Sodium: 135 mmol/L (ref 135–145)

## 2024-08-14 LAB — GLUCOSE, CAPILLARY
Glucose-Capillary: 166 mg/dL — ABNORMAL HIGH (ref 70–99)
Glucose-Capillary: 194 mg/dL — ABNORMAL HIGH (ref 70–99)
Glucose-Capillary: 150 mg/dL — ABNORMAL HIGH (ref 70–99)

## 2024-08-14 LAB — MAGNESIUM: Magnesium: 2.1 mg/dL (ref 1.7–2.4)

## 2024-08-14 LAB — PHOSPHORUS: Phosphorus: 3.2 mg/dL (ref 2.5–4.6)

## 2024-08-14 MED ORDER — LOSARTAN POTASSIUM 25 MG PO TABS
25.0000 mg | ORAL_TABLET | Freq: Every day | ORAL | Status: DC
  Administered 2024-08-14 – 2024-08-20 (×7): 25 mg via ORAL
  Filled 2024-08-14 (×7): qty 1

## 2024-08-14 MED ORDER — METOPROLOL SUCCINATE ER 25 MG PO TB24
25.0000 mg | ORAL_TABLET | Freq: Every day | ORAL | Status: DC
  Administered 2024-08-14 – 2024-08-22 (×9): 25 mg via ORAL
  Filled 2024-08-14 (×9): qty 1

## 2024-08-14 MED ORDER — BUDESONIDE 180 MCG/ACT IN AEPB
1.0000 | INHALATION_SPRAY | Freq: Two times a day (BID) | RESPIRATORY_TRACT | Status: DC
  Administered 2024-08-15 – 2024-08-22 (×15): 1 via RESPIRATORY_TRACT
  Filled 2024-08-14 (×2): qty 1

## 2024-08-14 NOTE — Consult Note (Signed)
 Consultation Note Date: 08/14/2024 at 1000  Patient Name: [name]  DOB: [birth year]  MRN: [id number]  Age / Sex: 63 y.o., male  PCP: Osa Geralds, NP Referring Physician: Caleen Qualia, MD  HPI/Patient Profile: 63 y.o. male  with past medical history of COPD on 2 L nasal cannula, CAD with prior CABG and recent stent placement, HLD, stroke, dementia, diastolic CHF, prior GI bleed not on anticoagulation (was on dual antiplatelet therapy) admitted on 08/12/2024 with syncopal episode and a bloody bowel movement at his facility-[facility].  Patient was treated for hemorrhagic shock secondary to acute GI bleed and admitted to the ICU.  PRBCs were given, patient did not require pressors, and hemoglobin improved.  Respiratory panel was positive for COVID.  Patient is being treated for acute blood loss anemia, hemorrhagic shock, COVID-19, COPD with acute exacerbation, elevated troponins, and acute on chronic hypoxic respiratory failure.  Additionally, patient has severe aortic stenosis and is being followed by cardiology.  PMT was consulted to support patient with goals of care discussions.  Of note, patient is familiar with PMT as patient was seen by my colleague during his previous admission 07/28/2024.  Clinical Assessment and Goals of Care: Extensive chart review completed prior to meeting patient including labs, vital signs, imaging, progress notes, orders, and available advanced directive documents from current and previous encounters. I then met with patient at bedside to discuss diagnosis prognosis, GOC, EOL wishes, disposition and options.  I introduced Palliative Medicine as specialized medical care for people living with serious illness. It focuses on providing relief from the symptoms and stress of a serious illness. The goal is to improve quality of life for both the patient and the  family.  We discussed a brief life review of the patient.  Patient speaks of being married twice and still up in touch with his first wife [name].  He has 2 children but shares he does not have any contact with them at this time.  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  Of note, patient shares that he does not have above a second grade education.  He shares difficulty with reading, sharing he is hopeful that someone can tell him what he needs to rather than having it given to him in writing.  Additionally, patient shares she has cataracts that make his vision blurring and is difficult for him to attempt to read.  Extensive discussion of patient's medical situation.  Education provided in space and opportunity given for patient to ask questions regarding his COPD, COVID, aortic stenosis, elevated troponins, and overall plan of care.  I attempted to elicit values and goals important to the patient.  He shares that he wants to live as long as possible.  However, he shares she does not want to be kept alive in the bed.  He shares that if he is not able to get up, walk, and do things that he wants to do, he would never want his wife to be prolonged  with machines.  We discussed CODE STATUS, life support, and CPR in the context of his ongoing chronic issues and comorbidities.  Patient continues to state that he would be accepting of CPR and mechanical ventilatory support but would not want to live on it long-term.  Patient declines that he would never be accepting of a tracheostomy or feeding tube.  Reviewed the patient has already created an HCPOA in which she was named his friends Bari and her husband as his next of kin decision makers.  He shares they are a great support system to him and he continues to uphold but they remain his HCPOA and the fact that he is unable to speak for himself.  Symptoms assessed.  Patient shares she is feeling well and has no acute issues at this  time.  He endorses sleeping through the night without issue.  He shares that he is comfortable and not having any pain with breathing as he has yesterday.  No adjustment to [facility] needed at this time.  After visiting with the patient, I counseled with RN and conveyed that patient has low health literacy.  Encouraged RN to ensure any instructions are given verbally to patient as he is unable to read.  Full code remains with understanding that patient would never be accepting of trach/PEG/long-term support via machines.  Symptom burden is low.  Goals are clear.  PMT will step back from daily visits.  Please reengage with PMT if goals change, patient/family's request, or patient's health deteriorates during hospitalization.  PMT will monitor peripherally and reengage where appropriate.  Primary Decision Maker PATIENT  Physical Exam Vitals reviewed.  Constitutional:      General: He is not in acute distress.    Appearance: He is normal weight. He is not ill-appearing.  HENT:     Head: Normocephalic.     Mouth/Throat:     Mouth: Mucous membranes are moist.  Eyes:     Pupils: Pupils are equal, round, and reactive to light.  Pulmonary:     Effort: Pulmonary effort is normal.  Abdominal:     Palpations: Abdomen is soft.  Skin:    General: Skin is warm and dry.  Neurological:     Mental Status: He is alert and oriented to person, place, and time.  Psychiatric:        Mood and Affect: Mood normal.        Behavior: Behavior normal.        Thought Content: Thought content normal.        Judgment: Judgment normal.     Palliative Assessment/Data: 60%     Thank you for this consult. Palliative medicine will continue to follow and assist holistically.   Time Total: 75 minutes  Time spent includes: Detailed review of medical records (labs, imaging, vital signs), medically appropriate exam (mental status, respiratory, cardiac, skin), discussed with treatment team, counseling and educating  patient, family and staff, documenting clinical information, medication management and coordination of care.  Signed by: Lamarr Gunner, DNP, FNP-BC Palliative Medicine   Please contact Palliative Medicine Team providers via [facility] for questions and concerns.

## 2024-08-14 NOTE — Assessment & Plan Note (Addendum)
 Initial concern of hemorrhagic shock with significant hypotension so he was admitted in ICU. Levophed  was ordered but never given. This has resolved

## 2024-08-14 NOTE — Progress Notes (Signed)
 Progress Note   Patient: [name] FMW:[id number] DOB: [birth year] DOA: 08/12/2024     2 DOS: the patient was seen and examined on 08/14/2024   Brief hospital course: Partly taken from prior notes.  Dirk Vanaman is a 63 y.o. male PMH multiple medical comorbidities including COPD on 2 L nasal cannula, CAD with prior CABG and recent stent placement, HLD, stroke, dementia, diastolic CHF, prior GI bleed not on anticoagulation (was on dual antiplatelet therapy) presents for evaluation of reported syncopal episode, apparently patient has a bloody bowel movement.  Pt has a hx of AVM's undergone multiple bidrectional endoscopies without findings of bleeding within the last 2 yrs.   He was recently discharged from [facility] following a prolonged hospitalization 07/26-08/14 following treatment of ETOH withdrawal requiring mechanical intubation due to worsening acute respiratory failure secondary to pneumonia due to recurrent aspiration along with acute on chronic diastolic CHF and cardiac arrest.  He was discharged to [facility].   On presentation patient was hypotensive in 70s, labs with sodium of 133, BUN 33, creatinine 1.68, BNP 1514, troponin 888, lactic acid 3.2, leukocytosis at 12.2 and hemoglobin of 4.7. Chest x-ray concerning for pulmonary vascular congestion and small pleural effusion.  Patient was initially admitted in ICU for concern of hemorrhagic shock secondary to acute GI bleed.  GI was consulted. 2 unit PRBC ordered.  Home aspirin  and Plavix  was held.  8/27: Vital stable, did not require any pressors, hemoglobin improved to 8.3, WBC 15, sodium 134, BUN 28, creatinine 1.56, T. bili at 1.3, MRSA PCR positive, respiratory panel positive for COVID.  Patient received 2 unit of PRBC and 1 unit of platelets. Troponin peaked at 888.  Adding steroid and increasing the frequency of DuoNeb due to developing wheezing and shortness of breath.  Starting on cefepime  and vancomycin  while  checking procalcitonin due to worsening leukocytosis and shortness of breath.  Patient with significant underlying comorbidities and high risk for mortality, palliative care was also consulted.  8/28: Hemodynamically stable, he was placed on heated high flow overnight, now weaned back to baseline O2 of 2 L. No more bloody bowel movement but hemoglobin decreased to 8 today.  Improving creatinine.  Procalcitonin was negative so antibiotics were discontinued.  Assessment and Plan: * GI bleed Acute blood loss anemia. 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  Discussed with GI, unfortunately taking care of those AVMs are beyond the scope of this hospital and they have suggested in past going to a tertiary care center for more specialized management. Patient came with hemoglobin of 4.7 s/p 2 unit of PRBC and 1 unit of platelets and now hemoglobin is 8.3>.8.0.  Did not had any more bloody bowel movement since in the hospital. - Holding aspirin  and Plavix  for another day per GI -Monitor for another bleeding - Monitor hemoglobin - Transfuse if below 8    Hemorrhagic shock (HCC) Initial concern of hemorrhagic shock with significant hypotension so he was admitted in ICU. Levophed  was ordered but never given. - Blood pressure now within goal -Continue to monitor -Supportive care  COVID-19 virus infection Patient tested positive for COVID and MRSA PCR. Worsening shortness of breath and congestion.  Chest x-ray with concern of some congestion? Worsening leukocytosis. Patient is high risk for any superadded bacterial infection due to significant underlying comorbidities and debility.  Procalcitonin was negative - Monitor procalcitonin -Holding more antibiotics at this time - Continue Solu-Medrol   COPD with acute exacerbation (HCC)  Likely due to COVID infection.  Developed significant congestion and wheeze. - Continue Solu-Medrol  -DuoNeb every 4  hourly -Continue supportive care  Elevated troponin No chest pain, history of recent NSTEMI. Troponin peaked at 888, likely secondary to demand ischemia with GI bleed. - Cardiology was consulted -Continue to monitor  History of CAD (coronary artery disease) Patient with history of NSTEMI s/p PCI in July 2025 Troponin elevated, likely secondary to demand with GI bleed. Holding aspirin  and Plavix  Cardiology was consulted Continue with statin  Acute on chronic hypoxic respiratory failure (HCC) Patient uses 2 L of oxygen at baseline. - Continue with supplemental oxygen-wean to baseline as tolerated  Severe aortic stenosis Patient has a scheduled outpatient appointment with Dr. Katina as he need evaluation for any possible procedure for severe aortic stenosis.   Subjective: Patient was sitting at the side of the bed, feeling much improved and able to speak full sentences today.  No chest pain.  Physical Exam: Vitals:   08/14/24 1319 08/14/24 1400 08/14/24 1500 08/14/24 1600  BP:   (!) 140/90 102/88  Pulse:  91 84 87  Resp:  18 13 (!) 22  Temp:  98.7 F (37.1 C)    TempSrc:  Oral    SpO2: 100% 100% 100% 100%  Weight:      Height:       General.  Frail gentleman, in no acute distress. Pulmonary.  Lungs clear bilaterally, normal respiratory effort. CV.  Regular rate and rhythm, no JVD, rub or murmur. Abdomen.  Soft, nontender, nondistended, BS positive. CNS.  Alert and oriented .  No focal neurologic deficit. Extremities.  No edema, no cyanosis, pulses intact and symmetrical. Psychiatry.  Judgment and insight appears normal.   Data Reviewed: Prior data reviewed  Family Communication: Discussed with patient  Disposition: Status is: Inpatient Remains inpatient appropriate because: Severity of illness  Planned Discharge Destination: Skilled nursing facility  DVT prophylaxis.  SCDs Time spent: 50 minutes  This record has been created using Manufacturing engineer. Errors have been sought and corrected,but may not always be located. Such creation errors do not reflect on the standard of care.   Author: Amaryllis Dare, MD 08/14/2024 4:39 PM  For on call review [email or web address].

## 2024-08-14 NOTE — Assessment & Plan Note (Addendum)
Prednisone taper

## 2024-08-14 NOTE — Progress Notes (Addendum)
 [facility] CLINIC CARDIOLOGY PROGRESS NOTE       Patient ID: [name] MRN: [id number] DOB/AGE: Jul 10, 1961 63 y.o.  Admit date: 08/12/2024 Referring Physician Dr. Caleen Primary Physician Osa Geralds, NP Primary Cardiologist Tinnie Maiden, NP/ Dr. Katina Reason for Consultation elevated trop, severe AS, complicated cardiac hx  HPI: [name] is a 63 y.o. 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 Of note, patient recently hospitalized 07/26 - 08/14 for ETOH withdrawal requiring mechanical intubation with PNA due to recurrent aspiration with AoC CHF. Patient was discharged to [facility]. During this admission, Hgb found to be 4.7. Troponin found to be elevated, patient denies any chest pain.  Cardiology was consulted for further evaluation.   Interval History: -Patient seen and examined this AM and laying comfortably in hospital bed nearly flat. Patient states he feels better today and slept well last night. Reports SOB is has improved.  Continues to deny any chest pain/pressure, palpitations or lightheadedness. -Patients BP and HR stable this AM. Overnight Tele showed no significant events.  -Patient remains on HFNC 40 with stable SpO2.  -Hemoglobin improved to 8.0 s/p PRBC.  Patient has not had a bowel movement yet. -Renal function improved this a.m. SABRA  Pertinent Cardiac History (Most recent) LHC 07/02/24   Prox LAD to Mid LAD lesion is 90% stenosed.   Ost LM to Mid LM lesion is 50% stenosed.   Ost Cx to Prox Cx lesion is 90% stenosed.   Ost RCA to Prox  RCA lesion is 100% stenosed.   Mid Graft to Dist Graft lesion is 95% stenosed.   Recommend uninterrupted dual antiplatelet therapy with Aspirin  81mg  daily and Clopidogrel  75mg  daily for a minimum of 6 months (stable ischemic heart disease-Class I recommendation).   1.  Severe native three-vessel CAD 2.  LIMA to LAD and SVG to OM widely patent 3.  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  Successful direct stenting of the distal SVG to PDA with distal protection with a 4.0 x 15 mm drug-eluting stent with intravascular ultrasound guidance 5.  Aspirin  and clopidogrel  for at least 6 months followed by clopidogrel  indefinitely 6.  Continue workup for aortic valve replacement    Review of systems complete and found to be negative unless listed above    Past Medical History:  Diagnosis Date   COPD (chronic obstructive pulmonary disease) (HCC)    CVA (cerebral infarction)    Headache    mild, since stroke 2012   Hypertension    MI (myocardial infarction) (HCC) 05/09/2012   Reading difficulty    pt reports he reads at about a second grade level   Stroke [facility]) 2012   numbness to left hand    Wears dentures    full upper and lower    Past Surgical History:  Procedure Laterality Date   CAROTID ENDARTERECTOMY Left    2012 or 2013   CHOLECYSTECTOMY N/A 07/26/2016   Procedure: LAPAROSCOPIC CHOLECYSTECTOMY;  Surgeon: Charlie FORBES Fell, MD;  Location: [facility] ORS;  Service: General;  Laterality: N/A;  COLONOSCOPY N/A 05/08/2024   Procedure: COLONOSCOPY;  Surgeon: Jinny Carmine, MD;  Location: [facility] ENDOSCOPY;  Service: Endoscopy;  Laterality: N/A;   COLONOSCOPY WITH PROPOFOL  N/A 02/09/2022   Procedure: COLONOSCOPY WITH PROPOFOL ;  Surgeon: Jinny Carmine, MD;  Location: [facility] ENDOSCOPY;  Service: Endoscopy;  Laterality: N/A;   CORONARY ARTERY BYPASS GRAFT  05/10/2012   3 vessel   CORONARY STENT INTERVENTION N/A 07/02/2024   Procedure: CORONARY STENT INTERVENTION;  Surgeon: Katina Albright,  MD;  Location: [facility] INVASIVE CV LAB;  Service: Cardiovascular;  Laterality: N/A;   CORONARY ULTRASOUND/IVUS N/A 07/02/2024   Procedure: Coronary Ultrasound/IVUS;  Surgeon: Katina Albright, MD;  Location: [facility] INVASIVE CV LAB;  Service: Cardiovascular;  Laterality: N/A;   ENTEROSCOPY N/A 05/16/2023   Procedure: ENTEROSCOPY;  Surgeon: Therisa Bi, MD;  Location: [facility] ENDOSCOPY;  Service: Gastroenterology;  Laterality: N/A;   ENTEROSCOPY N/A 06/01/2023   Procedure: ENTEROSCOPY;  Surgeon: Unk Corinn Skiff, MD;  Location: [facility] ENDOSCOPY;  Service: Gastroenterology;  Laterality: N/A;   ESOPHAGOGASTRODUODENOSCOPY  05/13/2023   Procedure: ESOPHAGOGASTRODUODENOSCOPY (EGD);  Surgeon: Jinny Carmine, MD;  Location: [facility] ENDOSCOPY;  Service: Endoscopy;;   ESOPHAGOGASTRODUODENOSCOPY (EGD) WITH PROPOFOL  N/A 10/28/2022   Procedure: ESOPHAGOGASTRODUODENOSCOPY (EGD) WITH PROPOFOL ;  Surgeon: Onita Elspeth Sharper, DO;  Location: [facility] ENDOSCOPY;  Service: Gastroenterology;  Laterality: N/A;   GIVENS CAPSULE STUDY  05/13/2023   Procedure: GIVENS CAPSULE STUDY;  Surgeon: Jinny Carmine, MD;  Location: [facility] ENDOSCOPY;  Service: Endoscopy;;   GIVENS CAPSULE STUDY  06/01/2023   Procedure: GIVENS CAPSULE STUDY;  Surgeon: Unk Corinn Skiff, MD;  Location: [facility] ENDOSCOPY;  Service: Gastroenterology;;   LEFT HEART CATH AND CORS/GRAFTS ANGIOGRAPHY N/A 07/02/2024   Procedure: LEFT HEART CATH AND CORS/GRAFTS ANGIOGRAPHY;  Surgeon: Katina Albright, MD;  Location: [facility] INVASIVE CV LAB;  Service: Cardiovascular;  Laterality: N/A;    Medications Prior to Admission  Medication Sig Dispense Refill Last Dose/Taking   albuterol  (VENTOLIN  HFA) 108 (90 Base) MCG/ACT inhaler Inhale 2 puffs into the lungs every 6 (six) hours as needed for wheezing or shortness of breath. 6.7 g 2 08/12/2024 at 12:56 AM   amiodarone  (PACERONE ) 200 MG tablet Take 1 tablet (200 mg total) by mouth daily.   08/12/2024 at  8:13 AM   ascorbic acid  (VITAMIN C ) 500 MG tablet  Take 1 tablet (500 mg total) by mouth daily. 30 tablet 2 08/12/2024 at  8:13 AM   aspirin  EC 81 MG tablet Take 1 tablet (81 mg total) by mouth daily. Swallow whole. 30 tablet 5 08/12/2024 at  8:13 AM   budesonide  (PULMICORT ) 0.5 MG/2ML nebulizer solution Take 2 mLs (0.5 mg total) by nebulization 2 (two) times daily.   08/12/2024 at  8:13 AM   clopidogrel  (PLAVIX ) 75 MG tablet Take 1 tablet (75 mg total) by mouth daily with breakfast. 30 tablet 11 08/12/2024 at  5:35 AM   dapagliflozin  propanediol (FARXIGA ) 10 MG TABS tablet Take 1 tablet (10 mg total) by mouth daily. 30 tablet 11 08/12/2024 at  8:13 AM   dextromethorphan -guaiFENesin  (MUCINEX  DM) 30-600 MG 12hr tablet Take 1 tablet by mouth 2 (two) times daily as needed for cough.   Unknown   folic acid  (FOLVITE ) 1 MG tablet Take 1 tablet (1 mg total) by mouth daily. 30 tablet 2 08/12/2024 at  8:13 AM   Glycerin , Laxative, (GLYCERIN , ADULT,) 2 g SUPP Place 1 suppository rectally daily as needed for moderate constipation.   Unknown   HYDROcodone -acetaminophen  (NORCO/VICODIN) 5-325 MG tablet Take 1 tablet by mouth every 6 (  six) hours as needed for moderate pain (pain score 4-6).   Unknown   ipratropium-albuterol  (DUONEB) 0.5-2.5 (3) MG/3ML SOLN Take 3 mLs by nebulization 3 (three) times daily.   08/12/2024 at 11:44 AM   iron  polysaccharides (NIFEREX) 150 MG capsule Take 1 capsule (150 mg total) by mouth daily. 30 capsule 2 08/12/2024 at  8:13 AM   lactulose  (CHRONULAC ) 10 GM/15ML solution Take 30 mLs (20 g total) by mouth daily as needed for moderate constipation or mild constipation.   08/10/2024   lidocaine  4 % Place 1 patch onto the skin daily.   08/12/2024 at  8:13 AM   melatonin 5 MG TABS Place 1 tablet (5 mg total) into feeding tube at bedtime as needed.   Unknown   Multiple Vitamin (MULTIVITAMIN WITH MINERALS) TABS tablet Take 1 tablet by mouth daily.   08/12/2024 at  8:13 AM   nicotine  (NICODERM CQ  - DOSED IN MG/24 HR) 7 mg/24hr patch Place 1 patch (7 mg  total) onto the skin daily.   08/12/2024 at  8:13 AM   polyethylene glycol (MIRALAX  / GLYCOLAX ) 17 g packet Take 17 g by mouth at bedtime.   08/10/2024   rosuvastatin  (CRESTOR ) 20 MG tablet Take 1 tablet (20 mg total) by mouth daily.   08/11/2024 at  9:22 PM   senna-docusate (SENOKOT-S) 8.6-50 MG tablet Take 2 tablets by mouth at bedtime.   08/11/2024 at  9:22 AM   sodium chloride  (OCEAN) 0.65 % SOLN nasal spray Place 1 spray into both nostrils as needed for congestion.   Unknown   thiamine  (VITAMIN B-1) 100 MG tablet Take 1 tablet (100 mg total) by mouth daily.   08/12/2024 at  8:13 AM   torsemide  (DEMADEX ) 20 MG tablet Take 1 tablet (20 mg total) by mouth daily.   08/12/2024 at  8:13 AM   traZODone  (DESYREL ) 50 MG tablet Take 50 mg by mouth at bedtime.   08/11/2024 at  9:22 PM   Vitamin D , Ergocalciferol , (DRISDOL ) 1.25 MG (50000 UNIT) CAPS capsule Take 1 capsule (50,000 Units total) by mouth every 7 (seven) days.   08/09/2024   feeding supplement (ENSURE PLUS HIGH PROTEIN) LIQD Take 237 mLs by mouth 3 (three) times daily between meals.      Social History   Socioeconomic History   Marital status: Divorced    Spouse name: Not on file   Number of children: Not on file   Years of education: Not on file   Highest education level: Not on file  Occupational History   Not on file  Tobacco Use   Smoking status: Former    Current packs/day: 0.00    Types: Cigarettes    Quit date: 44    Years since quitting: 32.6   Smokeless tobacco: Current    Types: Snuff   Tobacco comments:    changed to dip  Vaping Use   Vaping status: Never Used  Substance and Sexual Activity   Alcohol  use: Yes    Alcohol /week: 26.0 standard drinks of alcohol     Types: 26 Cans of beer per week   Drug use: No   Sexual activity: Yes    Birth control/protection: None  Other Topics Concern   Not on file  Social History Narrative   Not on file   Social Drivers of Health   Financial Resource Strain: Not on file   Food Insecurity: No Food Insecurity (08/13/2024)   Hunger Vital Sign    Worried About Running Out of Food  in the Last Year: Never true    Ran Out of Food in the Last Year: Never true  Transportation Needs: No Transportation Needs (08/13/2024)   PRAPARE - Administrator, Civil Service (Medical): No    Lack of Transportation (Non-Medical): No  Physical Activity: Not on file  Stress: Not on file  Social Connections: Socially Isolated (08/13/2024)   Social Connection and Isolation Panel    Frequency of Communication with Friends and Family: More than three times a week    Frequency of Social Gatherings with Friends and Family: More than three times a week    Attends Religious Services: Never    Database administrator or Organizations: No    Attends Banker Meetings: Never    Marital Status: Divorced  Catering manager Violence: Not At Risk (08/13/2024)   Humiliation, Afraid, Rape, and Kick questionnaire    Fear of Current or Ex-Partner: No    Emotionally Abused: No    Physically Abused: No    Sexually Abused: No    Family History  Problem Relation Age of Onset   Pneumonia Mother      Vitals:   08/14/24 0749 08/14/24 0800 08/14/24 0900 08/14/24 1000  BP:  (!) 133/94 (!) 125/98 (!) 144/114  Pulse:  92 (!) 112 (!) 106  Resp:  20 (!) 25 (!) 23  Temp:      TempSrc:      SpO2: 100% 100% 95% 99%  Weight:      Height:        PHYSICAL EXAM General: Chronically ill-appearing elderly male, well nourished, in no acute distress. HEENT: Normocephalic and atraumatic. Neck: No JVD.   Lungs: Normal respiratory effort on HFNC. Clear bilaterally to auscultation, diminished bilaterally. Heart: HRRR. Normal S1 and S2, + systolic murmur.  Abdomen: Non-distended appearing.  Msk: Normal strength and tone for age. Extremities: Warm and well perfused. No clubbing, cyanosis, edema.  Neuro: Alert and oriented X 3. Psych: Answers questions appropriately.   Labs: Basic  Metabolic Panel: Recent Labs    08/13/24 0401 08/14/24 0311  NA 134* 135  K 3.5 4.8  CL 101 99  CO2 26 26  GLUCOSE 107* 164*  BUN 28* 27*  CREATININE 1.56* 1.37*  CALCIUM  7.8* 8.3*  MG 2.1 2.1  PHOS 4.3 3.2   Liver Function Tests: Recent Labs    08/12/24 1457 08/13/24 0401  AST 31 19  ALT 14 12  ALKPHOS 49 50  BILITOT 1.0 1.3*  PROT 6.9 6.6  ALBUMIN  3.1* 2.9*   Recent Labs    08/12/24 1457  LIPASE 33   CBC: Recent Labs    08/13/24 2049 08/14/24 0311  WBC 11.0* 7.1  NEUTROABS 10.4* 6.2  HGB 9.5* 8.0*  HCT 30.1* 25.0*  MCV 89.3 88.7  PLT 342 264   Cardiac Enzymes: Recent Labs    08/12/24 1457 08/12/24 1657  TROPONINIHS 888* 793*   BNP: Recent Labs    08/12/24 1457  BNP 1,514.9*   D-Dimer: No results for input(s): DDIMER in the last 72 hours. Hemoglobin A1C: No results for input(s): HGBA1C in the last 72 hours. Fasting Lipid Panel: No results for input(s): CHOL, HDL, LDLCALC, TRIG, CHOLHDL, LDLDIRECT in the last 72 hours. Thyroid Function Tests: No results for input(s): TSH, T4TOTAL, T3FREE, THYROIDAB in the last 72 hours.  Invalid input(s): FREET3 Anemia Panel: No results for input(s): VITAMINB12, FOLATE, FERRITIN, TIBC, IRON , RETICCTPCT in the last 72 hours.   Radiology: DG Chest  Port 1 View Result Date: 08/13/2024 CLINICAL DATA:  Shortness of breath.  COVID positive. EXAM: PORTABLE CHEST 1 VIEW COMPARISON:  August 12, 2024. FINDINGS: Stable cardiomediastinal silhouette. Status post coronary artery bypass graft. Left lung is clear. Minimal right pleural effusion is noted with associated minimal associated atelectasis. Bony thorax is unremarkable. IMPRESSION: Minimal right pleural effusion is noted with associated minimal right basilar atelectasis. Electronically Signed   By: Lynwood Landy Raddle M.D.   On: 08/13/2024 17:20   DG Chest Portable 1 View Result Date: 08/12/2024 CLINICAL DATA:  Syncopal episode.  Septic shock. Clinical concern for pneumonia. EXAM: PORTABLE CHEST 1 VIEW COMPARISON:  07/29/2024. Abdomen and pelvis CTA dated 08/12/2024. Chest CTA dated 07/11/2024. FINDINGS: Interval enlarged cardiac silhouette. Stable post CABG changes. The lungs are hyperexpanded with mildly prominent upper lung zone pulmonary vasculature. Small right pleural effusion. Biapical bullous changes. Unremarkable bones. IMPRESSION: 1. Interval cardiomegaly and mild pulmonary vascular congestion. 2. Small right pleural effusion. 3. COPD with biapical bullous changes. Electronically Signed   By: Elspeth Bathe M.D.   On: 08/12/2024 16:58   CT ANGIO GI BLEED Result Date: 08/12/2024 CLINICAL DATA:  Anemia, syncope and melena. EXAM: CTA ABDOMEN AND PELVIS WITHOUT AND WITH CONTRAST TECHNIQUE: Multidetector CT imaging of the abdomen and pelvis was performed using the standard protocol during bolus administration of intravenous contrast. Multiplanar reconstructed images and MIPs were obtained and reviewed to evaluate the vascular anatomy. RADIATION DOSE REDUCTION: This exam was performed according to the departmental dose-optimization program which includes automated exposure control, adjustment of the mA and/or kV according to patient size and/or use of iterative reconstruction technique. CONTRAST:  OMNIPAQUE  IOHEXOL  350 MG/ML SOLN COMPARISON:  CT abdomen and pelvis 03/24/2016 FINDINGS: VASCULAR Aorta: Atherosclerosis of the abdominal aorta without aneurysm. Ulcerated plaque of the anterior infrarenal segment. Celiac: Calcified plaque causes approximately 50-60% origin stenosis. Distal branches are normally patent. SMA: Atherosclerosis of SMA trunk without significant stenosis. Renals: Heavily calcified plaque in proximal segments bilateral single renal arteries causes moderate stenoses of approximately 50-70%. The left renal artery almost immediately bifurcates beyond its origin. IMA: Patent small caliber IMA. Inflow: Heavily  calcified bilateral iliac arteries with diffuse disease and no aneurysmal disease or obstruction. Proximal Outflow: Calcified common femoral arteries. Probable significant stenosis at the origin of the right SFA. Veins: Venous phase imaging demonstrates patent venous structures in the abdomen and pelvis without thrombus or other abnormalities. Review of the MIP images confirms the above findings. NON-VASCULAR Lower chest: No acute abnormality. Hepatobiliary: Potential early cirrhotic morphology of the liver. No hepatic masses identified. Status post cholecystectomy. No biliary dilatation. Pancreas: Unremarkable. No pancreatic ductal dilatation or surrounding inflammatory changes. Spleen: Normal in size without focal abnormality. Adrenals/Urinary Tract: Adrenal glands are unremarkable. Kidneys are normal, without renal calculi, focal lesion, or hydronephrosis. Bladder is unremarkable. Stomach/Bowel: Speckled high density material in the stomach is present on imaging prior to contrast administration. There is no evidence active bleeding on arterial or venous phases of imaging within the gastrointestinal tract. Bowel shows no evidence of obstruction, ileus, inflammation or lesion. The appendix is not discretely visualized. No free intraperitoneal air. Lymphatic: No enlarged abdominal or pelvic lymph nodes. Reproductive: Prostate is unremarkable. Other: No abdominal wall hernia or abnormality. No abdominopelvic ascites. Musculoskeletal: Mild loss of height of the T12 vertebral body. IMPRESSION: 1. No evidence of active bleeding within the gastrointestinal tract. 2. Atherosclerosis of the abdominal aorta and iliac arteries without aneurysm. Ulcerated plaque of the anterior infrarenal aorta. 3. 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 4. Moderate stenoses of approximately 50-70% in proximal bilateral renal arteries. 5. Probable significant stenosis at the origin of the right SFA. 6. Potential early  cirrhotic morphology of the liver. 7. Mild loss of height of the T12 vertebral body. Aortic Atherosclerosis (ICD10-I70.0). Electronically Signed   By: Marcey Moan M.D.   On: 08/12/2024 16:21   DG Chest Port 1 View Result Date: 07/29/2024 CLINICAL DATA:  Congestive heart failure. EXAM: PORTABLE CHEST 1 VIEW COMPARISON:  07/24/2024 FINDINGS: The enteric tube has been removed. Post median sternotomy with stable cardiomegaly. Bilateral pleural effusions and bibasilar opacities, slightly worsened in the interim. Improved interstitial opacities with mild residual. Underlying emphysematous change. No pneumothorax. IMPRESSION: 1. Bilateral pleural effusions and bibasilar opacities, slightly worsened in the interim. 2. Improved interstitial opacities with mild residual. Electronically Signed   By: Andrea Gasman M.D.   On: 07/29/2024 09:53   DG Chest 1 View Result Date: 07/24/2024 CLINICAL DATA:  [id number] Dyspnea [id number] EXAM: CHEST  1 VIEW COMPARISON:  July 23, 2024, July 11, 2024 FINDINGS: Esophagogastric tube terminates below the diaphragm, outside the field of view. Diffuse interstitial opacities throughout both lungs. No lobar consolidation or pneumothorax. Lateral right upper lobe nodular opacity measures 1.9 cm, corresponding to the lung nodule on the prior chest CT. Moderate cardiomegaly. Sternotomy wires and CABG markers. No acute fracture or destructive lesions. Multilevel thoracic osteophytosis. IMPRESSION: 1. Moderate cardiomegaly. Unchanged interstitial opacities throughout both lungs, possibly persistent interstitial edema and underlying emphysematous changes. 2. Similarly appearing lateral right upper lung zone nodular opacity. Please see the comparison chest CT for further characterization. Electronically Signed   By: Rogelia Myers M.D.   On: 07/24/2024 08:43   DG Chest Port 1 View Result Date: 07/23/2024 CLINICAL DATA:  Shortness of breath. EXAM: PORTABLE CHEST 1 VIEW COMPARISON:  July 22, 2024. FINDINGS: The heart size and mediastinal contours are within normal limits. Status post coronary bypass graft. Mild diffuse interstitial densities are noted concerning for possible pulmonary edema with possible small pleural effusions. Nasogastric tube is seen entering stomach. The visualized skeletal structures are unremarkable. IMPRESSION: Mild diffuse interstitial densities are noted concerning for possible pulmonary edema with small pleural effusions. Electronically Signed   By: Lynwood Landy Raddle M.D.   On: 07/23/2024 13:03   DG ABD ACUTE 2+V W 1V CHEST Result Date: 07/22/2024 CLINICAL DATA:  [id number] Dyspnea [id number] [phone] Encounter for imaging study to confirm nasogastric (NG) tube placement [id number] EXAM: DG ABDOMEN ACUTE WITH 1 VIEW CHEST COMPARISON:  Chest x-ray 07/19/2024 FINDINGS: Enteric tube courses below the diaphragm with tip and side port overlying the expected region of the gastric lumen. The heart and mediastinal contours are within normal limits. No focal consolidation. Chronic coarsened interstitial markings with no overt pulmonary edema. Bilateral trace pleural effusion. No pneumothorax. Right upper quadrant surgical clips. There is no evidence of dilated bowel loops or free intraperitoneal air. No radiopaque calculi or other significant radiographic abnormality is seen. No acute osseous abnormality.  Intact sternotomy wires. IMPRESSION: 1. Enteric tube in good position. 2. Bowel trace pleural effusions. 3. Nonobstructive bowel gas pattern. Electronically Signed   By: Morgane  Naveau M.D.   On: 07/22/2024 14:18   DG Chest Port 1 View Result Date: 07/19/2024 CLINICAL DATA:  Dyspnea EXAM: PORTABLE CHEST 1 VIEW COMPARISON:  07/18/2024 FINDINGS: Two frontal views of the chest demonstrate an enteric catheter passing below diaphragm, tip excluded by collimation. Left internal jugular catheter tip overlies superior  vena cava unchanged. 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 The cardiac silhouette is  stable. Mild central vascular congestion, with a trace right pleural effusion unchanged since prior exam. The nodular density projecting over the right upper lateral chest on prior study is less well seen on this exam. This may be due to positioning. No pneumothorax. No acute bony abnormalities. IMPRESSION: 1. Pulmonary vascular congestion and small right pleural effusion, unchanged since prior study. 2. The nodular consolidation within the right upper lateral chest seen on prior x-ray and CT is not well seen on this exam, likely due to patient positioning. Electronically Signed   By: Ozell Daring M.D.   On: 07/19/2024 17:34   DG Chest Port 1 View Result Date: 07/18/2024 EXAM: 1 VIEW XRAY OF THE CHEST 07/18/2024 04:59:06 AM COMPARISON: 07/16/2024 CLINICAL HISTORY: [id number] Acute respiratory failure with hypoxia (HCC) 427266. Acute respiratory failure with hypoxia FINDINGS: LUNGS AND PLEURA: Nodular opacity within the periphery of the right upper lobe appears similar to CT from 07/11/2024. Please refer to that report for follow-up recommendations. Mild asymmetric hazy opacification over the right mid and right lower lung may be related to positional artifact versus small posterior layering effusion. Mild subsegmental atelectasis in the left base. HEART AND MEDIASTINUM: Stable cardiomediastinal contours. Previous median sternotomy and CABG procedure. BONES AND SOFT TISSUES: Aortic atherosclerotic calcification. Left IJ catheter is identified with the tip in the projection of the SVC. There is an enteric tube which courses below the field of view. ET tube tip is stable above the carina. IMPRESSION: 1. Stable nodular opacity within the periphery of the right upper lobe, similar to CT from 07/11/2024. Please refer to that report for follow-up recommendations. 2. Mild asymmetric hazy opacification over the right mid and right lower lung, possibly related to rotational artifact versus small posterior layering effusion.  3. Mild subsegmental atelectasis in the left base. Electronically signed by: Waddell Calk MD 07/18/2024 06:51 AM EDT RP Workstation: [id number]   DG Chest Port 1 View Result Date: 07/16/2024 CLINICAL DATA:  Endotracheal tube and central line placement. EXAM: PORTABLE CHEST 1 VIEW COMPARISON:  07/16/2024 at 8:58 a.m. FINDINGS: Patient is rotated to the right. Endotracheal tube has tip proximally 13.5 cm above the carina. This could be advanced approximately 6 cm. Left IJ central venous catheter has tip over the SVC. Enteric tube courses into the region of the stomach and off the image as tip is not visualized. Lungs are adequately inflated with possible mild hazy opacification over the right upper lung abutting the minor fissure which may be due to atelectasis or infection. Left lung is clear. Cardiomediastinal silhouette and remainder of the exam is unchanged. IMPRESSION: 1. Endotracheal tube has tip proximally 13.5 cm above the carina. This could be advanced approximately 6 cm. 2. Left IJ central venous catheter with tip over the SVC. 3. Possible mild hazy opacification over the right upper lung abutting the minor fissure which may be due to atelectasis or infection. Electronically Signed   By: Toribio Agreste M.D.   On: 07/16/2024 10:53   DG Chest Port 1 View Result Date: 07/16/2024 CLINICAL DATA:  [id number] Aspiration into airway [id number]. EXAM: PORTABLE CHEST 1 VIEW COMPARISON:  06/23/2024. FINDINGS: Bilateral lungs appear hyperlucent with coarse bronchovascular markings, in keeping with COPD. Bilateral lungs otherwise appear clear. No dense consolidation or lung collapse. Bilateral costophrenic angles are clear. Stable cardio-mediastinal silhouette. There are surgical staples along the heart border and sternotomy wires, status post CABG (coronary artery bypass graft). No acute  osseous abnormalities. The soft tissues are within normal limits. Enteric tube is seen coursing below the left hemidiaphragm however,  the tip is not included on the film. IMPRESSION: No active disease. COPD. Electronically Signed   By: Ree Molt M.D.   On: 07/16/2024 09:09   DG Abd 1 View Result Date: 07/16/2024 CLINICAL DATA:  Vomiting.  NG tube placement. EXAM: ABDOMEN - 1 VIEW COMPARISON:  07/15/2024 FINDINGS: Nasogastric tube has tip and side-port over the stomach in the upper abdomen just left of midline. Visualized lower thorax unchanged. Abdominopelvic images demonstrate a non obstructive bowel gas pattern. Mild fecal retention throughout the colon. Surgical clips over the right upper quadrant. There are degenerative changes of the spine and hips. Calcified plaque over the abdominal aorta and iliac arteries. IMPRESSION: 1. Nasogastric tube with tip and side-port over the stomach. 2. Nonobstructive bowel gas pattern. Mild fecal retention. 3. Aortic atherosclerosis. Electronically Signed   By: Toribio Agreste M.D.   On: 07/16/2024 07:51    ECHO 05/10/2024  1. Left ventricular ejection fraction, by estimation, is 60 to 65%. The left ventricle has normal function. The left ventricle has no regional wall motion abnormalities. The left ventricular internal cavity size was mildly dilated. Left ventricular diastolic parameters are consistent with Grade I diastolic dysfunction (impaired relaxation).   2. Right ventricular systolic function is normal. The right ventricular size is normal.   3. The mitral valve is normal in structure. No evidence of mitral valve regurgitation.   4. The aortic valve is normal in structure. Aortic valve regurgitation is trivial. Severe aortic valve stenosis.   TELEMETRY reviewed by me 08/14/2024: Sinus rhythm, rate 90s   EKG reviewed by me: sinus rhythm with borderline ST depression (similar to prior EKGs), rate 93 bpm  Data reviewed by me 08/14/2024: last 24h vitals tele labs imaging I/O hospitalist progress notes.  Principal Problem:   GI bleed Active Problems:   COPD with acute exacerbation  (HCC)   Elevated troponin   Severe aortic stenosis   Hemorrhagic shock (HCC)   History of CAD (coronary artery disease)   COVID-19 virus infection   Acute on chronic hypoxic respiratory failure (HCC)    ASSESSMENT AND PLAN:  [name] is a 63 y.o. 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 Of note, patient recently hospitalized 07/26 - 08/14 for ETOH withdrawal requiring mechanical intubation with PNA due to recurrent aspiration with AoC CHF. Patient was discharged to [facility]. During this admission, Hgb found to be 4.7. Troponin found to be elevated, patient denies any chest pain.  Cardiology was consulted for further evaluation.   # Acute on chronic hypoxic respiratory failure # COPD exacerbation # Leukocytosis # COVID positive On HFNC. Baseline O2 is 2L. -Management per primary team, on IV abx.  # Chronic HFpEF Patient does not appear significantly overloaded. CXR with mild pulmonary vascular congestion.  No lower extremity edema.  -Monitor and replenish electrolytes for a goal K >4, Mag >2  -Hold home torsemide  20 mg daily for now, not clinically fluid overloaded at this time. -Will plan  to resume home dapagliflozin  10 mg daily tomorrow.   # Severe aortic stenosis Echo from 04/2024 revealed severe AS. Aortic valve mean gradient measures  39.0 mmHg. Aortic valve peak gradient 66.3 mmHg. Aortic valve area, by VTI 0.49 cm.   -Originally scheduled to see Dr. Katina 7/30 to discuss aortic valve repair/replacement options, this has been rescheduled because patient  was intubated/sedated due to ETOH withdrawal at this time. Appointment on 09/03 at [facility].   # Demand ischemia # Coronary artery disease s/p CABG x3 (04/2012) s/p recent stent (07/02/24) # Hypertension # Hyperlipidemia # Hx Ventricular Ectopy Patient without chest pain. Trops elevated and flat 888 > 793 . EKG with no acute ischemic changes (similar to prior EKG).   -Home plavix  75 mg and aspirin  81 mg held due to acute anemia. Per GI states can be resumed tomorrow if no more evidence of active bleeding. Will await clearance from GI. Patient hasn't had BM. If only able to resume one med..preferred to be Plavix . -Continue Crestor  20 mg daily for high-intensity statin management.  -Continue home amio 200 mg daily for hx ventricular ectopy. Per tele this admission no evidence of PVCs. Will continue to monitor tele.  -Resume home losartan  25 mg, metoprolol  succinate 25 mg daily. -Will plan to resume home Imdur  tomorrow if BP remains stable -Recommend MAP > 65.  -Elevated and flat trops in setting of GI bleed with severe anemia Hgb 4.7 is most consistent with demand/supply mismatch and not ACS. No further cardiac diagnostics at this time.   # GI bleed # Acute on chronic anemia # Hx AVM Upon admission Hgb 4.7. Patient received 2 units PRBCs and 1 unit of platelets. Hgb improved this AM, 8.0. -Closely monitor H&H. -Goal Hgb >8 due to cardiac history. Recommend transfusion when <8.  -Management per primary team.    This patient's plan of care was discussed and created with Dr. Ammon and he is in agreement.  Signed: Dorene Comfort, PA-C  08/14/2024, 10:32 AM [facility] Cardiology

## 2024-08-14 NOTE — Assessment & Plan Note (Addendum)
 Patient with history of NSTEMI s/p PCI in July 2025 Troponin elevated, likely secondary to demand with GI bleed. Initially aspirin  and Plavix  was held but restarted.

## 2024-08-14 NOTE — Progress Notes (Signed)
 PHARMACY CONSULT NOTE - FOLLOW UP  Pharmacy Consult for Electrolyte Monitoring and Replacement   Recent Labs: Potassium (mmol/L)  Date Value  08/14/2024 4.8  12/02/2014 3.7   Magnesium  (mg/dL)  Date Value  91/71/7974 2.1   Calcium  (mg/dL)  Date Value  91/71/7974 8.3 (L)   Calcium , Total (mg/dL)  Date Value  87/83/7984 8.4 (L)   Albumin  (g/dL)  Date Value  91/72/7974 2.9 (L)  09/09/2014 3.8   Phosphorus (mg/dL)  Date Value  91/71/7974 3.2   Sodium (mmol/L)  Date Value  08/14/2024 135  12/02/2014 134 (L)     Assessment: 63 y.o. male with medical history significant of  GIB, alcohol  abuse, COPD on 2 L oxygen, hypertension, CAD with prior CABG and recent stent placement, HLD, stroke, dementia, dCHF, presents from [facility] via EMS following a syncopal episode. It was reported pt has had blood stools for the past month.   Goal of Therapy:  Potassium 4.0 - 5.1 mmol/L Magnesium  2.0 - 2.4 mg/dL All Other Electrolytes WNL  Plan:  ---no electrolyte replacement warranted for today ---recheck electrolytes in am  Adriana JONETTA Bolster ,PharmD Clinical Pharmacist 08/14/2024 7:14 AM

## 2024-08-14 NOTE — Progress Notes (Addendum)
   08/14/24 1230  Spiritual Encounters  Type of Visit Initial  Care provided to: Patient  Conversation partners present during encounter Nurse  Referral source Chaplain assessment  OnCall Visit Yes   Chaplain visited patient per overnight Chaplain sharing that patient could benefit from spiritual care.  Patient received a document from Child psychotherapist called Patient Rights and Chaplain and Nurse explained a little more about the form to the patient.  Patient said he likes the oxygen device they have him on because it is better for him than the other one he had.  Patient let Chaplain know he was feeling in better spirits today and Chaplain associated that better feeling with his body feeling better based on what the patient shared.  Chaplain also asked the patient if he'd ever considered an AD but patient may have to do more thinking on who in his life would be a good fit for the role of HCPOA.  Patient seems to have limited familial or relational connections.  Chaplain shared this with staff and concluded the visit with prayer.     Rev. Rana M. Nicholaus, M.Div.  Chaplain Resident [facility]

## 2024-08-14 NOTE — Progress Notes (Signed)
 Heart Failure Navigator Progress Note  Assessed for Heart & Vascular TOC clinic readiness.  Patient does not meet criteria due to current [facility] patient.   Navigator will sign off at this time.  Charmaine Pines, RN, BSN [facility] Heart Failure Navigator Secure Chat Only

## 2024-08-14 NOTE — Evaluation (Signed)
 Occupational Therapy Evaluation Patient Details Name: [name] MRN: [id number] DOB: 09/27/1961 Today's Date: 08/14/2024   History of Present Illness   63 y.o. male with medical history significant of  GIB, alcohol  abuse, COPD on 2 L oxygen, hypertension, CAD with prior CABG and recent Stent placement, HLD, stroke, dementia, dCHF, presents with SOB, cough, chest pain.     Patient was recently hospitalized from 7/25 - 8/14 and d/c'd to rehab facility.  Returns after syncopal epsisode with weakness and GI bleed - recieved blood, found to be Covid +     Clinical Impressions Chart reviewed to date, pt greeted sitting on edge of bed, agreeable to OT evaluation. PTA pt was at rehab, he report he was amb with RW, did require assist for ADLs in the last few days due to ongoing weakness. Prior to that, he is generally MOD I with ADL, PRN assist for IADL. Pt presents with deficits in strength, endurance, activity tolerance, balance, affecting safe and optimal ADL completion. STS completed with supervision-CGA, amb short distances in room (due to [facility]) with CGA with RW. When pt attempts to simulate LB dressing, increased SOB with increased time/PLB required for recovery. Spo2 generally >90% on 40L via HHFNC at rest, ?pleth when amb but into the 80s with mobility, will continue to assess. Of note, pt noted with HHFNC off prior this therapist entering room and spo2 is 98-100% on RA. Pt will benefit from acute OT to address deficits and to facilitate optimal ADL performance.     If plan is discharge home, recommend the following:   A lot of help with bathing/dressing/bathroom;Assistance with cooking/housework;Direct supervision/assist for medications management;Assist for transportation;A little help with walking and/or transfers     Functional Status Assessment   Patient has had a recent decline in their functional status and demonstrates the ability to make significant improvements in function in a  reasonable and predictable amount of time.     Equipment Recommendations   Other (comment) (defer to next venue of care)     Recommendations for Other Services         Precautions/Restrictions   Precautions Precautions: Fall Recall of Precautions/Restrictions: Intact Restrictions Weight Bearing Restrictions Per Provider Order: No     Mobility Bed Mobility               General bed mobility comments: NT sitting on edge of bed pre/post session    Transfers Overall transfer level: Needs assistance Equipment used: Rolling walker (2 wheels) Transfers: Sit to/from Stand Sit to Stand: Contact guard assist                  Balance Overall balance assessment: Needs assistance Sitting-balance support: Feet supported Sitting balance-Leahy Scale: Good     Standing balance support: During functional activity, Bilateral upper extremity supported Standing balance-Leahy Scale: Fair                             ADL either performed or assessed with clinical judgement   ADL Overall ADL's : Needs assistance/impaired Eating/Feeding: Set up;Sitting   Grooming: Wash/dry face;Sitting;Supervision/safety               Lower Body Dressing: Maximal assistance;Cueing for back precautions Lower Body Dressing Details (indicate cue type and reason): simulated at edge of bed, pt with increased SOB with trunk flexion Toilet Transfer: Contact guard assist;Rolling walker (2 wheels) Toilet Transfer Details (indicate cue type and reason): simulated  Functional mobility during ADLs: Contact guard assist;Rolling walker (2 wheels) (approx 4' forward and back 3 attempts)       Vision Patient Visual Report: No change from baseline       Perception         Praxis         Pertinent Vitals/Pain Pain Assessment Pain Assessment: No/denies pain     Extremity/Trunk Assessment Upper Extremity Assessment Upper Extremity Assessment: Generalized  weakness   Lower Extremity Assessment Lower Extremity Assessment: Generalized weakness       Communication Communication Communication: Impaired Factors Affecting Communication: Hearing impaired   Cognition Arousal: Alert Behavior During Therapy: WFL for tasks assessed/performed Cognition: Cognition impaired   Orientation impairments: Situation     Attention impairment (select first level of impairment): Selective attention Executive functioning impairment (select all impairments): Problem solving                   Following commands: Impaired Following commands impaired: Follows one step commands with increased time     Cueing  General Comments   Cueing Techniques: Verbal cues  poor pleth, ?spo2 to 80s on 40L via HHFNC during amb attempts however will continue to assess- it was noted pt was sitting on edge of bed with HHFNC out of nose and sats were 100% on RA at rest prior to OT entering room for eval; HR to 120s with amb   Exercises Other Exercises Other Exercises: edu re role of OT, role of rehab   Shoulder Instructions      Home Living Family/patient expects to be discharged to:: Private residence Living Arrangements: Alone (a guest house off of his friends house) Available Help at Discharge: Friend(s);Available PRN/intermittently Type of Home: House Home Access: Level entry     Home Layout: Two level;Able to live on main level with bedroom/bathroom     Bathroom Shower/Tub: Tub/shower unit   Bathroom Toilet: Standard     Home Equipment: Agricultural consultant (2 wheels);BSC/3in1;Shower seat   Additional Comments: 2L o2 at baseline      Prior Functioning/Environment Prior Level of Function : Independent/Modified Independent;Needs assist       Physical Assist : ADLs (physical)       ADLs Comments: pt reports he performs ADL grossly with MOD I at baseline, recently requiring assist at rehab for ADLs/IADLs; at baseline landlords wife assists with  med management and meals at times he also reports he goes out to eat frequently; per chart he does have an aid to assist with bathing    OT Problem List: Cardiopulmonary status limiting activity;Decreased cognition;Decreased activity tolerance;Decreased safety awareness;Decreased knowledge of use of DME or AE;Impaired balance (sitting and/or standing);Decreased strength;Pain   OT Treatment/Interventions: Self-care/ADL training;Therapeutic exercise;Therapeutic activities;Neuromuscular education;Cognitive remediation/compensation;Energy conservation;DME and/or AE instruction;Patient/family education;Balance training      OT Goals(Current goals can be found in the care plan section)   Acute Rehab OT Goals Patient Stated Goal: go home OT Goal Formulation: With patient Time For Goal Achievement: 08/28/24 Potential to Achieve Goals: Fair ADL Goals Pt Will Perform Grooming: with modified independence;sitting;standing Pt Will Perform Lower Body Dressing: with modified independence;sitting/lateral leans;sit to/from stand Pt Will Transfer to Toilet: with modified independence;ambulating Pt Will Perform Toileting - Clothing Manipulation and hygiene: with modified independence;sitting/lateral leans;sit to/from stand   OT Frequency:  Min 2X/week    Co-evaluation              AM-PAC OT 6 Clicks Daily Activity     Outcome Measure Help from  another person eating meals?: None Help from another person taking care of personal grooming?: None Help from another person toileting, which includes using toliet, bedpan, or urinal?: A Little Help from another person bathing (including washing, rinsing, drying)?: A Little Help from another person to put on and taking off regular upper body clothing?: A Little Help from another person to put on and taking off regular lower body clothing?: A Lot 6 Click Score: 19   End of Session Equipment Utilized During Treatment: Rolling walker (2  wheels);Oxygen Nurse Communication: Mobility status  Activity Tolerance: Patient tolerated treatment well Patient left: Other (comment);with call bell/phone within reach (sitting on edge of bed)  OT Visit Diagnosis: Muscle weakness (generalized) (M62.81);History of falling (Z91.81)                Time: 8975-8954 OT Time Calculation (min): 21 min Charges:  OT General Charges $OT Visit: 1 Visit OT Evaluation $OT Eval High Complexity: 1 High  Therisa Sheffield, OTD OTR/L  08/14/24, 12:21 PM

## 2024-08-14 NOTE — Assessment & Plan Note (Addendum)
 Acute blood loss anemia. 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   Patient received 5 units of packed red blood cells - Aspirin  and Plavix  restarted on 8/29. - Today's hemoglobin 9.8

## 2024-08-14 NOTE — Progress Notes (Signed)
 Physical Therapy Treatment Patient Details Name: [name] MRN: [id number] DOB: [birth year] Today's Date: 08/14/2024   History of Present Illness 63 y.o. male with medical history significant of  GIB, alcohol  abuse, COPD on 2 L oxygen, hypertension, CAD with prior CABG and recent Stent placement, HLD, stroke, dementia, dCHF, presents with SOB, cough, chest pain.     Patient was recently hospitalized from 7/25 - 8/14 and d/c'd to rehab facility.  Returns after syncopal epsisode with weakness and GI bleed - recieved blood, found to be Covid +    PT Comments  Pt pleasant and eager to work with PT.  He again continued to to have great SpO2 readings on 2L but was very quick to become short of breath and fatigue.  Pt was able to slightly increase ambulate distance today, but needing to sit and take prolonged rest with the modest effort.  Pt will benefit from ongoing PT, continue with POC.     If plan is discharge home, recommend the following: A little help with walking and/or transfers;A lot of help with bathing/dressing/bathroom;Assistance with cooking/housework;Assist for transportation;Help with stairs or ramp for entrance   Can travel by private vehicle     No  Equipment Recommendations  Rolling walker (2 wheels)    Recommendations for Other Services       Precautions / Restrictions Precautions Precautions: Fall Restrictions Weight Bearing Restrictions Per Provider Order: No     Mobility  Bed Mobility Overal bed mobility: Needs Assistance Bed Mobility: Sit to Supine     Supine to sit: Min assist, HOB elevated, Used rails     General bed mobility comments: did well, HHA to pull up to sitting, but did most LE movement and transition to upright w/o assist    Transfers Overall transfer level: Needs assistance Equipment used: Rolling walker (2 wheels) Transfers: Sit to/from Stand Sit to Stand: Contact guard assist           General transfer comment: rises to standing  with only incedental tactile cuing to insure forward/upward momentum is sustained until upright    Ambulation/Gait Ambulation/Gait assistance: Contact guard assist Gait Distance (Feet): 70 Feet Assistive device: Rolling walker (2 wheels)         General Gait Details: Similar to yesterday's effort, multiple small in-room loops but quick to fatigue.  SpO2 readings remain in mid/high 90s (on baseline 2L O2) but short of breath and hard to catch my breath with this effort. Again needed prolonged seated rest break before trying other activity 2/2 fatigue   Stairs             Wheelchair Mobility     Tilt Bed    Modified Rankin (Stroke Patients Only)       Balance Overall balance assessment: Needs assistance Sitting-balance support: Feet supported Sitting balance-Leahy Scale: Good       Standing balance-Leahy Scale: Fair Standing balance comment: no LOBs with walker, needed to abruptly sit and did not control descent well                            Communication Communication Communication: Impaired Factors Affecting Communication: Hearing impaired  Cognition Arousal: Alert Behavior During Therapy: WFL for tasks assessed/performed   PT - Cognitive impairments: No apparent impairments                         Following commands: Intact Following commands impaired: Follows  multi-step commands inconsistently    Cueing Cueing Techniques: Verbal cues  Exercises General Exercises - Lower Extremity Ankle Circles/Pumps: AROM, 10 reps Long Arc Quad: Strengthening, 10 reps Hip Flexion/Marching: Strengthening, 10 reps    General Comments        Pertinent Vitals/Pain Pain Assessment Pain Assessment: No/denies pain    Home Living                          Prior Function            PT Goals (current goals can now be found in the care plan section) Progress towards PT goals: Progressing toward goals    Frequency    Min  2X/week      PT Plan      Co-evaluation              AM-PAC PT 6 Clicks Mobility   Outcome Measure  Help needed turning from your back to your side while in a flat bed without using bedrails?: A Little Help needed moving from lying on your back to sitting on the side of a flat bed without using bedrails?: A Little Help needed moving to and from a bed to a chair (including a wheelchair)?: A Little Help needed standing up from a chair using your arms (e.g., wheelchair or bedside chair)?: A Little Help needed to walk in hospital room?: A Little Help needed climbing 3-5 steps with a railing? : A Lot 6 Click Score: 17    End of Session Equipment Utilized During Treatment: Oxygen Activity Tolerance: Patient limited by fatigue Patient left: with bed alarm set;with call bell/phone within reach   PT Visit Diagnosis: Muscle weakness (generalized) (M62.81);Difficulty in walking, not elsewhere classified (R26.2);Unsteadiness on feet (R26.81)     Time: 8490-8463 PT Time Calculation (min) (ACUTE ONLY): 27 min  Charges:    $Gait Training: 8-22 mins $Therapeutic Exercise: 8-22 mins PT General Charges $$ ACUTE PT VISIT: 1 Visit                     Carmin JONELLE Deed, DPT 08/14/2024, 5:31 PM

## 2024-08-14 NOTE — Progress Notes (Signed)
 Initial Nutrition Assessment  DOCUMENTATION CODES:   Not applicable  INTERVENTION:   Ensure Plus High Protein po TID, each supplement provides 350 kcal and 20 grams of protein  Magic cup TID with meals, each supplement provides 290 kcal and 9 grams of protein  MVI, folic acid  and thiamine  po daily   Pt at high refeed risk; recommend monitor potassium, magnesium  and phosphorus labs daily until stable  Daily weights  NUTRITION DIAGNOSIS:   Increased nutrient needs related to catabolic illness (COVID 19) as evidenced by estimated needs.  GOAL:   Patient will meet greater than or equal to 90% of their needs  MONITOR:   PO intake, Supplement acceptance, Labs, Weight trends, I & O's, Skin  REASON FOR ASSESSMENT:   Malnutrition Screening Tool    ASSESSMENT:   63 y/o male with h/o HLD, etoh abuse, HTN, CAD s/p CABG x 3 (2013), severe aortic stenosis, CHF, CVA, IDA, COPD, MI, GERD, hiatal hernia, CKD III, intestional AVM, depression, left heart catheterization s/p left heart cath and stent placement 7/16 and recent admission for NSTEMI, CHF/COPD exacerbation, suspected aspiration and etoh withdrawal and who is now admitted with GIB and COVID 19.  Pt unavailable at the time of RD visit today. Pt is well known to this RD from a recent previous admission. Spoke with the RD at [facility] who reports pt with good appetite and oral intake at [facility]. Pt was also eating well prior to his last discharge. Pt has been drinking Ensure supplements. Per chart, pt appears weight stable from his last admission. Pt eating 100% of his clear liquid meals; pt advanced to full liquids today. Recommend continue Ensure and vitamins. Pt is at refeed risk. RD will obtain exam and history at follow up.   Medications reviewed and include: vitamin C , folic acid , solu-medrol , MVI, nicotine , thiamine , vitamin D   Labs reviewed: K 4.8 wnl, BUN 27(H), creat 1.37(H), P 3.2 wnl, Mg 2.1 wnl Vitamin D - 12.02(L)-  04/2024 Wbc- 11.5(H), hgb 8.5(L), Hct 27.1(L) Cbgs- 194, 166 x 24 hrs   NUTRITION - FOCUSED PHYSICAL EXAM: Unable to perform at this time   Diet Order:   Diet Order             Diet full liquid Room service appropriate? Yes; Fluid consistency: Thin  Diet effective now                  EDUCATION NEEDS:   Education needs have been addressed  Skin:  Skin Assessment: Reviewed RN Assessment (ecchymosis)  Last BM:  8/26  Height:   Ht Readings from Last 1 Encounters:  08/12/24 6' (1.829 m)    Weight:   Wt Readings from Last 1 Encounters:  08/14/24 98.2 kg    Ideal Body Weight:  80.9 kg  BMI:  Body mass index is 29.36 kg/m.  Estimated Nutritional Needs:   Kcal:  2300-2600kcal/day  Protein:  115-130g/day  Fluid:  2.0L/day  Augustin Shams MS, RD, LDN If unable to be reached, please send secure chat to RD inpatient available from 8:00a-4:00p daily

## 2024-08-15 DIAGNOSIS — K31811 Angiodysplasia of stomach and duodenum with bleeding: Secondary | ICD-10-CM | POA: Diagnosis not present

## 2024-08-15 DIAGNOSIS — R578 Other shock: Secondary | ICD-10-CM | POA: Diagnosis not present

## 2024-08-15 DIAGNOSIS — I214 Non-ST elevation (NSTEMI) myocardial infarction: Secondary | ICD-10-CM | POA: Diagnosis not present

## 2024-08-15 DIAGNOSIS — U071 COVID-19: Secondary | ICD-10-CM | POA: Diagnosis not present

## 2024-08-15 DIAGNOSIS — J441 Chronic obstructive pulmonary disease with (acute) exacerbation: Secondary | ICD-10-CM | POA: Diagnosis not present

## 2024-08-15 LAB — BPAM PLATELET PHERESIS
Blood Product Expiration Date: 202508272359
ISSUE DATE / TIME: 202508271144
Unit Type and Rh: 202508272359
Unit Type and Rh: 5100

## 2024-08-15 LAB — CBC
HCT: 26.1 % — ABNORMAL LOW (ref 39.0–52.0)
Hemoglobin: 8 g/dL — ABNORMAL LOW (ref 13.0–17.0)
MCH: 28.3 pg (ref 26.0–34.0)
MCHC: 30.7 g/dL (ref 30.0–36.0)
MCV: 92.2 fL (ref 80.0–100.0)
Platelets: 284 K/uL (ref 150–400)
RBC: 2.83 MIL/uL — ABNORMAL LOW (ref 4.22–5.81)
RDW: 16.2 % — ABNORMAL HIGH (ref 11.5–15.5)
WBC: 13.6 K/uL — ABNORMAL HIGH (ref 4.0–10.5)
nRBC: 0 % (ref 0.0–0.2)

## 2024-08-15 LAB — GLUCOSE, CAPILLARY
Glucose-Capillary: 166 mg/dL — ABNORMAL HIGH (ref 70–99)
Glucose-Capillary: 190 mg/dL — ABNORMAL HIGH (ref 70–99)
Glucose-Capillary: 147 mg/dL — ABNORMAL HIGH (ref 70–99)
Glucose-Capillary: 212 mg/dL — ABNORMAL HIGH (ref 70–99)

## 2024-08-15 LAB — BASIC METABOLIC PANEL WITH GFR
Anion gap: 7 (ref 5–15)
BUN: 31 mg/dL — ABNORMAL HIGH (ref 8–23)
CO2: 26 mmol/L (ref 22–32)
Calcium: 8.4 mg/dL — ABNORMAL LOW (ref 8.9–10.3)
Chloride: 97 mmol/L — ABNORMAL LOW (ref 98–111)
Creatinine, Ser: 1.37 mg/dL — ABNORMAL HIGH (ref 0.61–1.24)
GFR, Estimated: 57 mL/min — ABNORMAL LOW (ref >60)
Glucose, Bld: 155 mg/dL — ABNORMAL HIGH (ref 70–99)
Potassium: 4.5 mmol/L (ref 3.5–5.1)
Sodium: 130 mmol/L — ABNORMAL LOW (ref 135–145)

## 2024-08-15 LAB — PREPARE RBC (CROSSMATCH)

## 2024-08-15 LAB — PREPARE PLATELET PHERESIS: Unit division: 0

## 2024-08-15 LAB — PHOSPHORUS: Phosphorus: 3.3 mg/dL (ref 2.5–4.6)

## 2024-08-15 LAB — MAGNESIUM: Magnesium: 2.3 mg/dL (ref 1.7–2.4)

## 2024-08-15 LAB — PROCALCITONIN: Procalcitonin: <0.1 ng/mL

## 2024-08-15 MED ORDER — SODIUM CHLORIDE 0.9% IV SOLUTION: Freq: Once | INTRAVENOUS | Status: DC

## 2024-08-15 MED ORDER — FE FUM-VIT C-VIT B12-FA 460-60-0.01-1 MG PO CAPS
1.0000 | ORAL_CAPSULE | Freq: Two times a day (BID) | ORAL | Status: DC
  Administered 2024-08-15 – 2024-08-22 (×15): 1 via ORAL
  Filled 2024-08-15 (×17): qty 1

## 2024-08-15 MED ORDER — DAPAGLIFLOZIN PROPANEDIOL 10 MG PO TABS
10.0000 mg | ORAL_TABLET | Freq: Every day | ORAL | Status: DC
  Administered 2024-08-15 – 2024-08-22 (×8): 10 mg via ORAL
  Filled 2024-08-15 (×8): qty 1

## 2024-08-15 MED ORDER — POLYETHYLENE GLYCOL 3350 17 G PO PACK
17.0000 g | PACK | Freq: Two times a day (BID) | ORAL | Status: DC
  Administered 2024-08-15 – 2024-08-20 (×10): 17 g via ORAL
  Filled 2024-08-15 (×12): qty 1

## 2024-08-15 MED ORDER — PREDNISONE 50 MG PO TABS
50.0000 mg | ORAL_TABLET | Freq: Every day | ORAL | Status: DC
  Administered 2024-08-16 – 2024-08-17 (×2): 50 mg via ORAL
  Filled 2024-08-15 (×2): qty 1

## 2024-08-15 MED ORDER — FUROSEMIDE 10 MG/ML IJ SOLN
40.0000 mg | Freq: Once | INTRAMUSCULAR | Status: AC
  Administered 2024-08-15: 40 mg via INTRAVENOUS
  Filled 2024-08-15: qty 4

## 2024-08-15 MED ORDER — ASPIRIN 81 MG PO TBEC
81.0000 mg | DELAYED_RELEASE_TABLET | Freq: Every day | ORAL | Status: DC
  Administered 2024-08-15: 81 mg via ORAL
  Filled 2024-08-15: qty 1

## 2024-08-15 MED ORDER — CLOPIDOGREL BISULFATE 75 MG PO TABS
75.0000 mg | ORAL_TABLET | Freq: Every day | ORAL | Status: DC
  Administered 2024-08-15: 75 mg via ORAL
  Filled 2024-08-15: qty 1

## 2024-08-15 MED ORDER — LACTULOSE 10 GM/15ML PO SOLN
30.0000 g | Freq: Two times a day (BID) | ORAL | Status: DC | PRN
  Administered 2024-08-15: 30 g via ORAL
  Filled 2024-08-15: qty 60

## 2024-08-15 MED ORDER — ASPIRIN 81 MG PO TBEC
81.0000 mg | DELAYED_RELEASE_TABLET | Freq: Every day | ORAL | Status: DC
  Administered 2024-08-15 – 2024-08-22 (×8): 81 mg via ORAL
  Filled 2024-08-15 (×7): qty 1

## 2024-08-15 MED ORDER — CLOPIDOGREL BISULFATE 75 MG PO TABS
75.0000 mg | ORAL_TABLET | Freq: Every day | ORAL | Status: DC
  Administered 2024-08-15 – 2024-08-22 (×8): 75 mg via ORAL
  Filled 2024-08-15 (×7): qty 1

## 2024-08-15 MED ORDER — FUROSEMIDE 8 MG/ML PO SOLN: 40.0000 mg | Freq: Once | ORAL | Status: DC

## 2024-08-15 NOTE — Progress Notes (Signed)
 PHARMACY CONSULT NOTE - FOLLOW UP  Pharmacy Consult for Electrolyte Monitoring and Replacement   Recent Labs: Potassium (mmol/L)  Date Value  08/15/2024 4.5  12/02/2014 3.7   Magnesium  (mg/dL)  Date Value  91/70/7974 2.3   Calcium  (mg/dL)  Date Value  91/70/7974 8.4 (L)   Calcium , Total (mg/dL)  Date Value  87/83/7984 8.4 (L)   Albumin  (g/dL)  Date Value  91/72/7974 2.9 (L)  09/09/2014 3.8   Phosphorus (mg/dL)  Date Value  91/70/7974 3.3   Sodium (mmol/L)  Date Value  08/15/2024 130 (L)  12/02/2014 134 (L)     Assessment: 63 y.o. male with medical history significant of  GIB, alcohol  abuse, COPD on 2 L oxygen, hypertension, CAD with prior CABG and recent stent placement, HLD, stroke, dementia, dCHF, presents from [facility] via EMS following a syncopal episode. It was reported pt has had blood stools for the past month.   Goal of Therapy:  Potassium 4.0 - 5.1 mmol/L Magnesium  2.0 - 2.4 mg/dL All Other Electrolytes WNL  Plan:  No electrolyte replacement warranted for today Will dc CCM electrolytes monitoring now that patient is on 2a  Charmian Forbis Rodriguez-Guzman PharmD, BCPS 08/15/2024 7:39 AM

## 2024-08-15 NOTE — Care Management Important Message (Signed)
 Important Message  Patient Details  Name: [name] MRN: [id number] Date of Birth: 1961/08/08   Important Message Given:  Yes - Medicare IM     [name] 08/15/2024, 4:12 PM

## 2024-08-15 NOTE — Progress Notes (Signed)
 Palliative Care Progress Note, Assessment & Plan   Patient Name: [name]       Date: 08/15/2024 DOB: October 24, 1961  Age: 63 y.o. MRN#: [id number] Attending Physician: Caleen Qualia, MD Primary Care Physician: Osa Geralds, NP Admit Date: 08/12/2024  Subjective: Patient is lying in bed, awake and alert, nasal cannula in place.  He acknowledges my presence and is able to make his wishes known.  No family or friends present during my visit.  HPI: 63 y.o. male  with past medical history of COPD on 2 L nasal cannula, CAD with prior CABG and recent stent placement, HLD, stroke, dementia, diastolic CHF, prior GI bleed not on anticoagulation (was on dual antiplatelet therapy) admitted on 08/12/2024 with syncopal episode and a bloody bowel movement at his facility[facility].   Patient was treated for hemorrhagic shock secondary to acute GI bleed and admitted to the ICU.  PRBCs were given, patient did not require pressors, and hemoglobin improved.  Respiratory panel was positive for COVID.   Patient is being treated for acute blood loss anemia, hemorrhagic shock, COVID-19, COPD with acute exacerbation, elevated troponins, and acute on chronic hypoxic respiratory failure.   Additionally, patient has severe aortic stenosis and is being followed by cardiology.   PMT was consulted to support patient with goals of care discussions.   Of note, patient is familiar with PMT as patient was seen by my colleague during his previous admission 07/28/2024.  Summary of counseling/coordination of care: Extensive chart review completed prior to meeting patient including labs, vital signs, imaging, progress notes, orders, and available advanced directive documents from current and previous encounters.   After  reviewing the patient's chart and assessing the patient at bedside, I spoke with patient in regards to symptom management and goals of care.   Symptoms assessed.  Patient endorses he feels just fine.  He says he has some popping sensation in his jaw but denies pain or discomfort.  No adjustment to [facility] needed at this time.  Discussed next steps to include either returning home or to SNF for rehab.  He shares that ultimately he would like to return home but that he understands if he needs more therapies.  After meeting with patient, I spoke with his [name] over the phone.  She also has COVID at this time.  We discussed boundaries and goals of care as well as wishes that [name] shared with me yesterday.  She is in agreement and endorses that it is in line with what [name] would want for him to remain a full code and accepting of full scope of care.  Additionally, she is in agreement that he would never want to lay in the bed being kept alive by machines long-term.  Discussed tracheostomy and PEG tube placement as things to avoid should it ever come to that.  She was in agreement.  Discussed concern of aortic stenosis and need for aortic valve replacement.  Reviewed patient has an appointment with cardiology on 9/3.  Calderon shares concern she is not sure patient can make this appointment.  Discussed importance of discussing potential treatments such as endoscopy, high risk for anesthesia, and chance of hemorrhaging again.  Space  and opportunity provided for [name] to ask questions and share her thoughts.  She is concerned that patient will continue to have active bleeding.  Assured her that H&H is being monitored as well as signs of bloody stools.  Clear liquid diet continues in order to be able to closely monitor for bleeding.  Education provided that patient's EtOH use, COPD, and recent GI bleed puts him in a category for high risk mortality.  She shares understanding.  She shares patient has also  said that he is grateful for every moment he gets because he knows that his medical conditions are not reversible or curable.  Additionally, she shared concerns that he may not be able to return home if he is unable to be independent.  Discussed the potential is for him to return to another SNF for rehab.  TOC following closely for discharge planning.  Symptom burden is low.  Goals are clear.  PMT will step back from daily visits.  [name] made aware to reach out to PMT should acute palliative needs arise during hospitalization.  Physical Exam Vitals reviewed.  Constitutional:      Appearance: He is normal weight.  HENT:     Head: Normocephalic.     Mouth/Throat:     Mouth: Mucous membranes are moist.  Eyes:     Pupils: Pupils are equal, round, and reactive to light.  Pulmonary:     Effort: Pulmonary effort is normal.     Comments: Sublimity in place Abdominal:     Palpations: Abdomen is soft.  Skin:    General: Skin is warm and dry.  Neurological:     Mental Status: He is alert and oriented to person, place, and time.  Psychiatric:        Mood and Affect: Mood normal.        Behavior: Behavior normal.             Total Time 35 minutes   Time spent includes: Detailed review of medical records (labs, imaging, vital signs), medically appropriate exam (mental status, respiratory, cardiac, skin), discussed with treatment team, counseling and educating patient, family and staff, documenting clinical information, medication management and coordination of care.  Douglas L. Arvid, DNP, FNP-BC Palliative Medicine Team

## 2024-08-15 NOTE — Progress Notes (Signed)
 Progress Note   Patient: [name] FMW:[id number] DOB: 1961/02/24 DOA: 08/12/2024     3 DOS: the patient was seen and examined on 08/15/2024   Brief hospital course: Partly taken from prior notes.  [name] is a 63 y.o. male PMH multiple medical comorbidities including COPD on 2 L nasal cannula, CAD with prior CABG and recent stent placement, HLD, stroke, dementia, diastolic CHF, prior GI bleed not on anticoagulation (was on dual antiplatelet therapy) presents for evaluation of reported syncopal episode, apparently patient has a bloody bowel movement.  Pt has a hx of AVM's undergone multiple bidrectional endoscopies without findings of bleeding within the last 2 yrs.   He was recently discharged from [facility] following a prolonged hospitalization 07/26-08/14 following treatment of ETOH withdrawal requiring mechanical intubation due to worsening acute respiratory failure secondary to pneumonia due to recurrent aspiration along with acute on chronic diastolic CHF and cardiac arrest.  He was discharged to [facility].   On presentation patient was hypotensive in 70s, labs with sodium of 133, BUN 33, creatinine 1.68, BNP 1514, troponin 888, lactic acid 3.2, leukocytosis at 12.2 and hemoglobin of 4.7. Chest x-ray concerning for pulmonary vascular congestion and small pleural effusion.  Patient was initially admitted in ICU for concern of hemorrhagic shock secondary to acute GI bleed.  GI was consulted. 2 unit PRBC ordered.  Home aspirin  and Plavix  was held.  8/27: Vital stable, did not require any pressors, hemoglobin improved to 8.3, WBC 15, sodium 134, BUN 28, creatinine 1.56, T. bili at 1.3, MRSA PCR positive, respiratory panel positive for COVID.  Patient received 2 unit of PRBC and 1 unit of platelets. Troponin peaked at 888.  Adding steroid and increasing the frequency of DuoNeb due to developing wheezing and shortness of breath.  Starting on cefepime  and vancomycin  while  checking procalcitonin due to worsening leukocytosis and shortness of breath.  Patient with significant underlying comorbidities and high risk for mortality, palliative care was also consulted.  8/28: Hemodynamically stable, he was placed on heated high flow overnight, now weaned back to baseline O2 of 2 L. No more bloody bowel movement but hemoglobin decreased to 8 today.  Improving creatinine.  Procalcitonin was negative so antibiotics were discontinued.  8/29: Hemodynamically stable, on 2 L of oxygen.  Hemoglobin with further decreased to 8 today.  Giving 1 unit of PRBC due to recent NSTEMI.  Restarting aspirin  and Plavix  as there is no active bleeding but he did not had any bowel movement since in the hospital-working on bowel regimen.  Assessment and Plan: * GI bleed Acute blood loss anemia. 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  Discussed with GI, unfortunately taking care of those AVMs are beyond the scope of this hospital and they have suggested in past going to a tertiary care center for more specialized management. Patient came with hemoglobin of 4.7 s/p 4 unit of PRBC and 1 unit of platelets and now hemoglobin is 8.3>.8.0>8.8>8.0.  Did not had any more bloody bowel movement since in the hospital. - Aspirin  and Plavix  are being restarted today -Ordered 1 more unit of PRBC to keep the hemoglobin above 8 due to recent NSTEMI -Monitor for another bleeding - Monitor hemoglobin - Transfuse if below 8    Hemorrhagic shock (HCC) Initial concern of hemorrhagic shock with significant hypotension so he was admitted in ICU. Levophed  was ordered but never given. - Blood pressure now within goal -Continue to monitor -Supportive  care  COVID-19 virus infection Patient tested positive for COVID and MRSA PCR. Worsening shortness of breath and congestion.  Chest x-ray with concern of some congestion? Worsening leukocytosis. Patient is high risk for any  superadded bacterial infection due to significant underlying comorbidities and debility.  Procalcitonin was negative - Monitor procalcitonin -Holding more antibiotics at this time - Continue Solu-Medrol   COPD with acute exacerbation (HCC) Likely due to COVID infection.  Developed significant congestion and wheeze. - Switching Solu-Medrol  with prednisone  -DuoNeb every 4 hourly -Continue supportive care  Elevated troponin No chest pain, history of recent NSTEMI. Troponin peaked at 888, likely secondary to demand ischemia with GI bleed. - Cardiology was consulted -Continue to monitor  History of CAD (coronary artery disease) Patient with history of NSTEMI s/p PCI in July 2025 Troponin elevated, likely secondary to demand with GI bleed. Initially aspirin  and Plavix  was held-restarting today Cardiology was consulted Continue with statin  Acute on chronic hypoxic respiratory failure (HCC) Patient uses 2 L of oxygen at baseline. - Continue with supplemental oxygen-wean to baseline as tolerated  Severe aortic stenosis Patient has a scheduled outpatient appointment with Dr. Katina as he need evaluation for any possible procedure for severe aortic stenosis.   Subjective: Patient was seen and examined today.  Little tachypneic but denies any worsening shortness of breath.  No chest pain.  Agrees to take 1 more unit of blood.  No bowel movement since in the hospital.  Physical Exam: Vitals:   08/15/24 0840 08/15/24 1123 08/15/24 1214 08/15/24 1300  BP: (!) 131/91 114/71 116/80 120/82  Pulse: 83 80 82 88  Resp:   20 18  Temp: 97.7 F (36.5 C) 97.7 F (36.5 C) 98.2 F (36.8 C) 98.4 F (36.9 C)  TempSrc: Axillary Axillary Oral Oral  SpO2: 100% 91% 94% 92%  Weight:      Height:       General.  Frail gentleman, in no acute distress. Pulmonary.  Lungs clear bilaterally, normal respiratory effort. CV.  Regular rate and rhythm, no JVD, rub or murmur. Abdomen.  Soft, nontender,  nondistended, BS positive. CNS.  Alert and oriented .  No focal neurologic deficit. Extremities.  No edema, no cyanosis, pulses intact and symmetrical. Psychiatry.  Judgment and insight appears normal.    Data Reviewed: Prior data reviewed  Family Communication: Discussed with patient  Disposition: Status is: Inpatient Remains inpatient appropriate because: Severity of illness  Planned Discharge Destination: Skilled nursing facility  DVT prophylaxis.  SCDs Time spent: 50 minutes  This record has been created using Conservation officer, historic buildings. Errors have been sought and corrected,but may not always be located. Such creation errors do not reflect on the standard of care.   Author: Amaryllis Dare, MD 08/15/2024 2:39 PM  For on call review [email or web address].

## 2024-08-15 NOTE — Plan of Care (Signed)
  Problem: Education: Goal: Knowledge of General Education information will improve Description: Including pain rating scale, medication(s)/side effects and non-pharmacologic comfort measures Outcome: Progressing   Problem: Health Behavior/Discharge Planning: Goal: Ability to manage health-related needs will improve Outcome: Progressing   Problem: Clinical Measurements: Goal: Ability to maintain clinical measurements within normal limits will improve Outcome: Progressing Goal: Will remain free from infection Outcome: Progressing Goal: Diagnostic test results will improve Outcome: Progressing Goal: Respiratory complications will improve Outcome: Progressing Goal: Cardiovascular complication will be avoided Outcome: Progressing   Problem: Activity: Goal: Risk for activity intolerance will decrease Outcome: Progressing   Problem: Nutrition: Goal: Adequate nutrition will be maintained Outcome: Progressing   Problem: Coping: Goal: Level of anxiety will decrease Outcome: Progressing   Problem: Elimination: Goal: Will not experience complications related to bowel motility Outcome: Progressing Goal: Will not experience complications related to urinary retention Outcome: Progressing   Problem: Pain Managment: Goal: General experience of comfort will improve and/or be controlled Outcome: Progressing   Problem: Safety: Goal: Ability to remain free from injury will improve Outcome: Progressing   Problem: Skin Integrity: Goal: Risk for impaired skin integrity will decrease Outcome: Progressing   Problem: Education: Goal: Knowledge of risk factors and measures for prevention of condition will improve Outcome: Progressing   Problem: Coping: Goal: Psychosocial and spiritual needs will be supported Outcome: Progressing   Problem: Respiratory: Goal: Will maintain a patent airway Outcome: Progressing Goal: Complications related to the disease process, condition or treatment  will be avoided or minimized Outcome: Progressing

## 2024-08-15 NOTE — Progress Notes (Signed)
 [facility] CLINIC CARDIOLOGY PROGRESS NOTE       Patient ID: [name] MRN: [id number] DOB/AGE: 1960/12/25 63 y.o.  Admit date: 08/12/2024 Referring Physician Dr. Caleen Primary Physician Osa Geralds, NP Primary Cardiologist Tinnie Maiden, NP/ Dr. Katina Reason for Consultation elevated trop, severe AS, complicated cardiac hx  HPI: [name] is a 63 y.o. 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 Of note, patient recently hospitalized 07/26 - 08/14 for ETOH withdrawal requiring mechanical intubation with PNA due to recurrent aspiration with AoC CHF. Patient was discharged to [facility]. During this admission, Hgb found to be 4.7. Troponin found to be elevated, patient denies any chest pain.  Cardiology was consulted for further evaluation.   Interval History: -Patient seen and examined this AM and sitting in bedside chair. Patient states he feels okay this AM. States his breathing feels worse today, at this time he wasn't wearing is [place].  Continues to deny any chest pain/pressure, palpitations or lightheadedness. -Patients BP and HR stable this AM. Overnight Tele showed no significant events.  -Patient weaned to 2L [place] (at baseline).  -Hemoglobin at 8.0 s/p PRBC.  Patient has not had a bowel movement yet. -Renal function stable this AM.    Pertinent Cardiac History (Most recent) LHC 07/02/24   Prox LAD to Mid LAD lesion is 90% stenosed.   Ost LM to Mid LM lesion is 50% stenosed.   Ost Cx to Prox Cx lesion is 90% stenosed.   Ost RCA to Prox RCA lesion is 100%  stenosed.   Mid Graft to Dist Graft lesion is 95% stenosed.   Recommend uninterrupted dual antiplatelet therapy with Aspirin  81mg  daily and Clopidogrel  75mg  daily for a minimum of 6 months (stable ischemic heart disease-Class I recommendation).   1.  Severe native three-vessel CAD 2.  LIMA to LAD and SVG to OM widely patent 3.  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  Successful direct stenting of the distal SVG to PDA with distal protection with a 4.0 x 15 mm drug-eluting stent with intravascular ultrasound guidance 5.  Aspirin  and clopidogrel  for at least 6 months followed by clopidogrel  indefinitely 6.  Continue workup for aortic valve replacement    Review of systems complete and found to be negative unless listed above    Past Medical History:  Diagnosis Date   COPD (chronic obstructive pulmonary disease) (HCC)    CVA (cerebral infarction)    Headache    mild, since stroke 2012   Hypertension    MI (myocardial infarction) (HCC) 05/09/2012   Reading difficulty    pt reports he reads at about a second grade level   Stroke [name]   numbness to left hand    Wears dentures    full upper and lower    Past Surgical History:  Procedure Laterality Date   CAROTID ENDARTERECTOMY Left    2012 or 2013   CHOLECYSTECTOMY N/A 07/26/2016   Procedure: LAPAROSCOPIC CHOLECYSTECTOMY;  Surgeon: Charlie FORBES Fell, MD;  Location: [facility] ORS;  Service: General;  Laterality: N/A;  COLONOSCOPY N/A 05/08/2024   Procedure: COLONOSCOPY;  Surgeon: Jinny Carmine, MD;  Location: [facility] ENDOSCOPY;  Service: Endoscopy;  Laterality: N/A;   COLONOSCOPY WITH PROPOFOL  N/A 02/09/2022   Procedure: COLONOSCOPY WITH PROPOFOL ;  Surgeon: Jinny Carmine, MD;  Location: [facility] ENDOSCOPY;  Service: Endoscopy;  Laterality: N/A;   CORONARY ARTERY BYPASS GRAFT  05/10/2012   3 vessel   CORONARY STENT INTERVENTION N/A 07/02/2024   Procedure: CORONARY STENT INTERVENTION;  Surgeon: Katina Albright, MD;  Location: [facility]  INVASIVE CV LAB;  Service: Cardiovascular;  Laterality: N/A;   CORONARY ULTRASOUND/IVUS N/A 07/02/2024   Procedure: Coronary Ultrasound/IVUS;  Surgeon: Katina Albright, MD;  Location: [facility] INVASIVE CV LAB;  Service: Cardiovascular;  Laterality: N/A;   ENTEROSCOPY N/A 05/16/2023   Procedure: ENTEROSCOPY;  Surgeon: Therisa Bi, MD;  Location: [facility] ENDOSCOPY;  Service: Gastroenterology;  Laterality: N/A;   ENTEROSCOPY N/A 06/01/2023   Procedure: ENTEROSCOPY;  Surgeon: Unk Corinn Skiff, MD;  Location: [facility] ENDOSCOPY;  Service: Gastroenterology;  Laterality: N/A;   ESOPHAGOGASTRODUODENOSCOPY  05/13/2023   Procedure: ESOPHAGOGASTRODUODENOSCOPY (EGD);  Surgeon: Jinny Carmine, MD;  Location: [facility] ENDOSCOPY;  Service: Endoscopy;;   ESOPHAGOGASTRODUODENOSCOPY (EGD) WITH PROPOFOL  N/A 10/28/2022   Procedure: ESOPHAGOGASTRODUODENOSCOPY (EGD) WITH PROPOFOL ;  Surgeon: Onita Elspeth Sharper, DO;  Location: [facility] ENDOSCOPY;  Service: Gastroenterology;  Laterality: N/A;   GIVENS CAPSULE STUDY  05/13/2023   Procedure: GIVENS CAPSULE STUDY;  Surgeon: Jinny Carmine, MD;  Location: [facility] ENDOSCOPY;  Service: Endoscopy;;   GIVENS CAPSULE STUDY  06/01/2023   Procedure: GIVENS CAPSULE STUDY;  Surgeon: Unk Corinn Skiff, MD;  Location: [facility] ENDOSCOPY;  Service: Gastroenterology;;   LEFT HEART CATH AND CORS/GRAFTS ANGIOGRAPHY N/A 07/02/2024   Procedure: LEFT HEART CATH AND CORS/GRAFTS ANGIOGRAPHY;  Surgeon: Katina Albright, MD;  Location: [facility] INVASIVE CV LAB;  Service: Cardiovascular;  Laterality: N/A;    Medications Prior to Admission  Medication Sig Dispense Refill Last Dose/Taking   albuterol  (VENTOLIN  HFA) 108 (90 Base) MCG/ACT inhaler Inhale 2 puffs into the lungs every 6 (six) hours as needed for wheezing or shortness of breath. 6.7 g 2 08/12/2024 at 12:56 AM   amiodarone  (PACERONE ) 200 MG tablet Take 1 tablet (200 mg total) by mouth daily.   08/12/2024 at  8:13 AM   ascorbic acid  (VITAMIN C ) 500 MG tablet Take 1 tablet (500 mg  total) by mouth daily. 30 tablet 2 08/12/2024 at  8:13 AM   aspirin  EC 81 MG tablet Take 1 tablet (81 mg total) by mouth daily. Swallow whole. 30 tablet 5 08/12/2024 at  8:13 AM   budesonide  (PULMICORT ) 0.5 MG/2ML nebulizer solution Take 2 mLs (0.5 mg total) by nebulization 2 (two) times daily.   08/12/2024 at  8:13 AM   clopidogrel  (PLAVIX ) 75 MG tablet Take 1 tablet (75 mg total) by mouth daily with breakfast. 30 tablet 11 08/12/2024 at  5:35 AM   dapagliflozin  propanediol (FARXIGA ) 10 MG TABS tablet Take 1 tablet (10 mg total) by mouth daily. 30 tablet 11 08/12/2024 at  8:13 AM   dextromethorphan -guaiFENesin  (MUCINEX  DM) 30-600 MG 12hr tablet Take 1 tablet by mouth 2 (two) times daily as needed for cough.   Unknown   folic acid  (FOLVITE ) 1 MG tablet Take 1 tablet (1 mg total) by mouth daily. 30 tablet 2 08/12/2024 at  8:13 AM   Glycerin , Laxative, (GLYCERIN , ADULT,) 2 g SUPP Place 1 suppository rectally daily as needed for moderate constipation.   Unknown   HYDROcodone -acetaminophen  (NORCO/VICODIN) 5-325 MG tablet Take 1 tablet by mouth every 6 (  six) hours as needed for moderate pain (pain score 4-6).   Unknown   ipratropium-albuterol  (DUONEB) 0.5-2.5 (3) MG/3ML SOLN Take 3 mLs by nebulization 3 (three) times daily.   08/12/2024 at 11:44 AM   iron  polysaccharides (NIFEREX) 150 MG capsule Take 1 capsule (150 mg total) by mouth daily. 30 capsule 2 08/12/2024 at  8:13 AM   lactulose  (CHRONULAC ) 10 GM/15ML solution Take 30 mLs (20 g total) by mouth daily as needed for moderate constipation or mild constipation.   08/10/2024   lidocaine  4 % Place 1 patch onto the skin daily.   08/12/2024 at  8:13 AM   melatonin 5 MG TABS Place 1 tablet (5 mg total) into feeding tube at bedtime as needed.   Unknown   Multiple Vitamin (MULTIVITAMIN WITH MINERALS) TABS tablet Take 1 tablet by mouth daily.   08/12/2024 at  8:13 AM   nicotine  (NICODERM CQ  - DOSED IN MG/24 HR) 7 mg/24hr patch Place 1 patch (7 mg total) onto the skin  daily.   08/12/2024 at  8:13 AM   polyethylene glycol (MIRALAX  / GLYCOLAX ) 17 g packet Take 17 g by mouth at bedtime.   08/10/2024   rosuvastatin  (CRESTOR ) 20 MG tablet Take 1 tablet (20 mg total) by mouth daily.   08/11/2024 at  9:22 PM   senna-docusate (SENOKOT-S) 8.6-50 MG tablet Take 2 tablets by mouth at bedtime.   08/11/2024 at  9:22 AM   sodium chloride  (OCEAN) 0.65 % SOLN nasal spray Place 1 spray into both nostrils as needed for congestion.   Unknown   thiamine  (VITAMIN B-1) 100 MG tablet Take 1 tablet (100 mg total) by mouth daily.   08/12/2024 at  8:13 AM   torsemide  (DEMADEX ) 20 MG tablet Take 1 tablet (20 mg total) by mouth daily.   08/12/2024 at  8:13 AM   traZODone  (DESYREL ) 50 MG tablet Take 50 mg by mouth at bedtime.   08/11/2024 at  9:22 PM   Vitamin D , Ergocalciferol , (DRISDOL ) 1.25 MG (50000 UNIT) CAPS capsule Take 1 capsule (50,000 Units total) by mouth every 7 (seven) days.   08/09/2024   feeding supplement (ENSURE PLUS HIGH PROTEIN) LIQD Take 237 mLs by mouth 3 (three) times daily between meals.      Social History   Socioeconomic History   Marital status: Divorced    Spouse name: Not on file   Number of children: Not on file   Years of education: Not on file   Highest education level: Not on file  Occupational History   Not on file  Tobacco Use   Smoking status: Former    Current packs/day: 0.00    Types: Cigarettes    Quit date: 63    Years since quitting: 32.6   Smokeless tobacco: Current    Types: Snuff   Tobacco comments:    changed to dip  Vaping Use   Vaping status: Never Used  Substance and Sexual Activity   Alcohol  use: Yes    Alcohol /week: 26.0 standard drinks of alcohol     Types: 26 Cans of beer per week   Drug use: No   Sexual activity: Yes    Birth control/protection: None  Other Topics Concern   Not on file  Social History Narrative   Not on file   Social Drivers of Health   Financial Resource Strain: Not on file  Food Insecurity: No Food  Insecurity (08/13/2024)   Hunger Vital Sign    Worried About Running Out of Food  in the Last Year: Never true    Ran Out of Food in the Last Year: Never true  Transportation Needs: No Transportation Needs (08/13/2024)   PRAPARE - Administrator, Civil Service (Medical): No    Lack of Transportation (Non-Medical): No  Physical Activity: Not on file  Stress: Not on file  Social Connections: Socially Isolated (08/13/2024)   Social Connection and Isolation Panel    Frequency of Communication with Friends and Family: More than three times a week    Frequency of Social Gatherings with Friends and Family: More than three times a week    Attends Religious Services: Never    Database administrator or Organizations: No    Attends Banker Meetings: Never    Marital Status: Divorced  Catering manager Violence: Not At Risk (08/13/2024)   Humiliation, Afraid, Rape, and Kick questionnaire    Fear of Current or Ex-Partner: No    Emotionally Abused: No    Physically Abused: No    Sexually Abused: No    Family History  Problem Relation Age of Onset   Pneumonia Mother      Vitals:   08/15/24 0040 08/15/24 0500 08/15/24 0516 08/15/24 0840  BP: 114/83  121/80 (!) 131/91  Pulse: 82  84 83  Resp: 18  18   Temp: 98.4 F (36.9 C)  97.6 F (36.4 C) 97.7 F (36.5 C)  TempSrc:      SpO2: 94%  100% 100%  Weight:  92.2 kg    Height:        PHYSICAL EXAM General: Chronically ill-appearing elderly male, well nourished, in no acute distress. HEENT: Normocephalic and atraumatic. Neck: No JVD.   Lungs: Normal respiratory effort on 2L [place]. Clear bilaterally to auscultation, diminished bilaterally. Heart: HRRR. Normal S1 and S2, + systolic murmur.  Abdomen: Non-distended appearing.  Msk: Normal strength and tone for age. Extremities: Warm and well perfused. No clubbing, cyanosis, edema.  Neuro: Alert and oriented X 3. Psych: Answers questions appropriately.   Labs: Basic  Metabolic Panel: Recent Labs    08/14/24 0311 08/15/24 0703  NA 135 130*  K 4.8 4.5  CL 99 97*  CO2 26 26  GLUCOSE 164* 155*  BUN 27* 31*  CREATININE 1.37* 1.37*  CALCIUM  8.3* 8.4*  MG 2.1 2.3  PHOS 3.2 3.3   Liver Function Tests: Recent Labs    08/12/24 1457 08/13/24 0401  AST 31 19  ALT 14 12  ALKPHOS 49 50  BILITOT 1.0 1.3*  PROT 6.9 6.6  ALBUMIN  3.1* 2.9*   Recent Labs    08/12/24 1457  LIPASE 33   CBC: Recent Labs    08/14/24 1026 08/14/24 1551 08/15/24 0703  WBC 11.5* 15.6* 13.6*  NEUTROABS 10.5* 13.3*  --   HGB 8.5* 8.8* 8.0*  HCT 27.1* 27.5* 26.1*  MCV 90.0 87.9 92.2  PLT 303 263 284   Cardiac Enzymes: Recent Labs    08/12/24 1457 08/12/24 1657  TROPONINIHS 888* 793*   BNP: Recent Labs    08/12/24 1457  BNP 1,514.9*   D-Dimer: No results for input(s): DDIMER in the last 72 hours. Hemoglobin A1C: No results for input(s): HGBA1C in the last 72 hours. Fasting Lipid Panel: No results for input(s): CHOL, HDL, LDLCALC, TRIG, CHOLHDL, LDLDIRECT in the last 72 hours. Thyroid Function Tests: No results for input(s): TSH, T4TOTAL, T3FREE, THYROIDAB in the last 72 hours.  Invalid input(s): FREET3 Anemia Panel: No results for input(s): VITAMINB12,  FOLATE, FERRITIN, TIBC, IRON , RETICCTPCT in the last 72 hours.   Radiology: [facility] Chest Port 1 View Result Date: 08/13/2024 CLINICAL DATA:  Shortness of breath.  COVID positive. EXAM: PORTABLE CHEST 1 VIEW COMPARISON:  August 12, 2024. FINDINGS: Stable cardiomediastinal silhouette. Status post coronary artery bypass graft. Left lung is clear. Minimal right pleural effusion is noted with associated minimal associated atelectasis. Bony thorax is unremarkable. IMPRESSION: Minimal right pleural effusion is noted with associated minimal right basilar atelectasis. Electronically Signed   By: Lynwood Landy Raddle M.D.   On: 08/13/2024 17:20   DG Chest Portable 1 View Result Date:  08/12/2024 CLINICAL DATA:  Syncopal episode. Septic shock. Clinical concern for pneumonia. EXAM: PORTABLE CHEST 1 VIEW COMPARISON:  07/29/2024. Abdomen and pelvis CTA dated 08/12/2024. Chest CTA dated 07/11/2024. FINDINGS: Interval enlarged cardiac silhouette. Stable post CABG changes. The lungs are hyperexpanded with mildly prominent upper lung zone pulmonary vasculature. Small right pleural effusion. Biapical bullous changes. Unremarkable bones. IMPRESSION: 1. Interval cardiomegaly and mild pulmonary vascular congestion. 2. Small right pleural effusion. 3. COPD with biapical bullous changes. Electronically Signed   By: Elspeth Bathe M.D.   On: 08/12/2024 16:58   CT ANGIO GI BLEED Result Date: 08/12/2024 CLINICAL DATA:  Anemia, syncope and melena. EXAM: CTA ABDOMEN AND PELVIS WITHOUT AND WITH CONTRAST TECHNIQUE: Multidetector CT imaging of the abdomen and pelvis was performed using the standard protocol during bolus administration of intravenous contrast. Multiplanar reconstructed images and MIPs were obtained and reviewed to evaluate the vascular anatomy. RADIATION DOSE REDUCTION: This exam was performed according to the departmental dose-optimization program which includes automated exposure control, adjustment of the mA and/or kV according to patient size and/or use of iterative reconstruction technique. CONTRAST:  OMNIPAQUE  IOHEXOL  350 MG/ML SOLN COMPARISON:  CT abdomen and pelvis 03/24/2016 FINDINGS: VASCULAR Aorta: Atherosclerosis of the abdominal aorta without aneurysm. Ulcerated plaque of the anterior infrarenal segment. Celiac: Calcified plaque causes approximately 50-60% origin stenosis. Distal branches are normally patent. SMA: Atherosclerosis of SMA trunk without significant stenosis. Renals: Heavily calcified plaque in proximal segments bilateral single renal arteries causes moderate stenoses of approximately 50-70%. The left renal artery almost immediately bifurcates beyond its origin. IMA:  Patent small caliber IMA. Inflow: Heavily calcified bilateral iliac arteries with diffuse disease and no aneurysmal disease or obstruction. Proximal Outflow: Calcified common femoral arteries. Probable significant stenosis at the origin of the right SFA. Veins: Venous phase imaging demonstrates patent venous structures in the abdomen and pelvis without thrombus or other abnormalities. Review of the MIP images confirms the above findings. NON-VASCULAR Lower chest: No acute abnormality. Hepatobiliary: Potential early cirrhotic morphology of the liver. No hepatic masses identified. Status post cholecystectomy. No biliary dilatation. Pancreas: Unremarkable. No pancreatic ductal dilatation or surrounding inflammatory changes. Spleen: Normal in size without focal abnormality. Adrenals/Urinary Tract: Adrenal glands are unremarkable. Kidneys are normal, without renal calculi, focal lesion, or hydronephrosis. Bladder is unremarkable. Stomach/Bowel: Speckled high density material in the stomach is present on imaging prior to contrast administration. There is no evidence active bleeding on arterial or venous phases of imaging within the gastrointestinal tract. Bowel shows no evidence of obstruction, ileus, inflammation or lesion. The appendix is not discretely visualized. No free intraperitoneal air. Lymphatic: No enlarged abdominal or pelvic lymph nodes. Reproductive: Prostate is unremarkable. Other: No abdominal wall hernia or abnormality. No abdominopelvic ascites. Musculoskeletal: Mild loss of height of the T12 vertebral body. IMPRESSION: 1. No evidence of active bleeding within the gastrointestinal tract. 2. Atherosclerosis of the abdominal  aorta and iliac arteries without aneurysm. Ulcerated plaque of the anterior infrarenal aorta. 3. 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 4. Moderate stenoses of approximately 50-70% in proximal bilateral renal arteries. 5. Probable significant stenosis at the origin  of the right SFA. 6. Potential early cirrhotic morphology of the liver. 7. Mild loss of height of the T12 vertebral body. Aortic Atherosclerosis (ICD10-I70.0). Electronically Signed   By: Marcey Moan M.D.   On: 08/12/2024 16:21   DG Chest Port 1 View Result Date: 07/29/2024 CLINICAL DATA:  Congestive heart failure. EXAM: PORTABLE CHEST 1 VIEW COMPARISON:  07/24/2024 FINDINGS: The enteric tube has been removed. Post median sternotomy with stable cardiomegaly. Bilateral pleural effusions and bibasilar opacities, slightly worsened in the interim. Improved interstitial opacities with mild residual. Underlying emphysematous change. No pneumothorax. IMPRESSION: 1. Bilateral pleural effusions and bibasilar opacities, slightly worsened in the interim. 2. Improved interstitial opacities with mild residual. Electronically Signed   By: Andrea Gasman M.D.   On: 07/29/2024 09:53   DG Chest 1 View Result Date: 07/24/2024 CLINICAL DATA:  [id number] Dyspnea [id number] EXAM: CHEST  1 VIEW COMPARISON:  July 23, 2024, July 11, 2024 FINDINGS: Esophagogastric tube terminates below the diaphragm, outside the field of view. Diffuse interstitial opacities throughout both lungs. No lobar consolidation or pneumothorax. Lateral right upper lobe nodular opacity measures 1.9 cm, corresponding to the lung nodule on the prior chest CT. Moderate cardiomegaly. Sternotomy wires and CABG markers. No acute fracture or destructive lesions. Multilevel thoracic osteophytosis. IMPRESSION: 1. Moderate cardiomegaly. Unchanged interstitial opacities throughout both lungs, possibly persistent interstitial edema and underlying emphysematous changes. 2. Similarly appearing lateral right upper lung zone nodular opacity. Please see the comparison chest CT for further characterization. Electronically Signed   By: Rogelia Myers M.D.   On: 07/24/2024 08:43   DG Chest Port 1 View Result Date: 07/23/2024 CLINICAL DATA:  Shortness of breath. EXAM: PORTABLE  CHEST 1 VIEW COMPARISON:  July 22, 2024. FINDINGS: The heart size and mediastinal contours are within normal limits. Status post coronary bypass graft. Mild diffuse interstitial densities are noted concerning for possible pulmonary edema with possible small pleural effusions. Nasogastric tube is seen entering stomach. The visualized skeletal structures are unremarkable. IMPRESSION: Mild diffuse interstitial densities are noted concerning for possible pulmonary edema with small pleural effusions. Electronically Signed   By: Lynwood Landy Raddle M.D.   On: 07/23/2024 13:03   DG ABD ACUTE 2+V W 1V CHEST Result Date: 07/22/2024 CLINICAL DATA:  [id number] Dyspnea [id number] [phone] Encounter for imaging study to confirm nasogastric (NG) tube placement [id number] EXAM: DG ABDOMEN ACUTE WITH 1 VIEW CHEST COMPARISON:  Chest x-ray 07/19/2024 FINDINGS: Enteric tube courses below the diaphragm with tip and side port overlying the expected region of the gastric lumen. The heart and mediastinal contours are within normal limits. No focal consolidation. Chronic coarsened interstitial markings with no overt pulmonary edema. Bilateral trace pleural effusion. No pneumothorax. Right upper quadrant surgical clips. There is no evidence of dilated bowel loops or free intraperitoneal air. No radiopaque calculi or other significant radiographic abnormality is seen. No acute osseous abnormality.  Intact sternotomy wires. IMPRESSION: 1. Enteric tube in good position. 2. Bowel trace pleural effusions. 3. Nonobstructive bowel gas pattern. Electronically Signed   By: Morgane  Naveau M.D.   On: 07/22/2024 14:18   DG Chest Port 1 View Result Date: 07/19/2024 CLINICAL DATA:  Dyspnea EXAM: PORTABLE CHEST 1 VIEW COMPARISON:  07/18/2024 FINDINGS: Two frontal views of the chest demonstrate an enteric  catheter passing below diaphragm, tip excluded by collimation. Left internal jugular catheter tip overlies superior vena cava unchanged. Postsurgical changes from  prior CABG. The cardiac silhouette is stable. Mild central vascular congestion, with a trace right pleural effusion unchanged since prior exam. The nodular density projecting over the right upper lateral chest on prior study is less well seen on this exam. This may be due to positioning. No pneumothorax. No acute bony abnormalities. IMPRESSION: 1. Pulmonary vascular congestion and small right pleural effusion, unchanged since prior study. 2. The nodular consolidation within the right upper lateral chest seen on prior x-ray and CT is not well seen on this exam, likely due to patient positioning. Electronically Signed   By: Ozell Daring M.D.   On: 07/19/2024 17:34   DG Chest Port 1 View Result Date: 07/18/2024 EXAM: 1 VIEW XRAY OF THE CHEST 07/18/2024 04:59:06 AM COMPARISON: 07/16/2024 CLINICAL HISTORY: [id number] Acute respiratory failure with hypoxia (HCC) [id number]. Acute respiratory failure with hypoxia FINDINGS: LUNGS AND PLEURA: Nodular opacity within the periphery of the right upper lobe appears similar to CT from 07/11/2024. Please refer to that report for follow-up recommendations. Mild asymmetric hazy opacification over the right mid and right lower lung may be related to positional artifact versus small posterior layering effusion. Mild subsegmental atelectasis in the left base. HEART AND MEDIASTINUM: Stable cardiomediastinal contours. Previous median sternotomy and CABG procedure. BONES AND SOFT TISSUES: Aortic atherosclerotic calcification. Left IJ catheter is identified with the tip in the projection of the SVC. There is an enteric tube which courses below the field of view. ET tube tip is stable above the carina. IMPRESSION: 1. Stable nodular opacity within the periphery of the right upper lobe, similar to CT from 07/11/2024. Please refer to that report for follow-up recommendations. 2. Mild asymmetric hazy opacification over the right mid and right lower lung, possibly related to rotational artifact  versus small posterior layering effusion. 3. Mild subsegmental atelectasis in the left base. Electronically signed by: Waddell Calk MD 07/18/2024 06:51 AM EDT RP Workstation: [id number]   DG Chest Port 1 View Result Date: 07/16/2024 CLINICAL DATA:  Endotracheal tube and central line placement. EXAM: PORTABLE CHEST 1 VIEW COMPARISON:  07/16/2024 at 8:58 a.m. FINDINGS: Patient is rotated to the right. Endotracheal tube has tip proximally 13.5 cm above the carina. This could be advanced approximately 6 cm. Left IJ central venous catheter has tip over the SVC. Enteric tube courses into the region of the stomach and off the image as tip is not visualized. Lungs are adequately inflated with possible mild hazy opacification over the right upper lung abutting the minor fissure which may be due to atelectasis or infection. Left lung is clear. Cardiomediastinal silhouette and remainder of the exam is unchanged. IMPRESSION: 1. Endotracheal tube has tip proximally 13.5 cm above the carina. This could be advanced approximately 6 cm. 2. Left IJ central venous catheter with tip over the SVC. 3. Possible mild hazy opacification over the right upper lung abutting the minor fissure which may be due to atelectasis or infection. Electronically Signed   By: Toribio Agreste M.D.   On: 07/16/2024 10:53    ECHO 05/10/2024  1. Left ventricular ejection fraction, by estimation, is 60 to 65%. The left ventricle has normal function. The left ventricle has no regional wall motion abnormalities. The left ventricular internal cavity size was mildly dilated. Left ventricular diastolic parameters are consistent with Grade I diastolic dysfunction (impaired relaxation).   2. Right ventricular systolic function is normal.  The right ventricular size is normal.   3. The mitral valve is normal in structure. No evidence of mitral valve regurgitation.   4. The aortic valve is normal in structure. Aortic valve regurgitation is trivial. Severe  aortic valve stenosis.   TELEMETRY reviewed by me 08/15/2024: Sinus rhythm, rate 90s   EKG reviewed by me: sinus rhythm with borderline ST depression (similar to prior EKGs), rate 93 bpm  Data reviewed by me 08/15/2024: last 24h vitals tele labs imaging I/O hospitalist progress notes.  Principal Problem:   GI bleed Active Problems:   COPD with acute exacerbation (HCC)   Elevated troponin   Severe aortic stenosis   Hemorrhagic shock (HCC)   History of CAD (coronary artery disease)   COVID-19 virus infection   Acute on chronic hypoxic respiratory failure (HCC)    ASSESSMENT AND PLAN:  [name] is a 63 y.o. 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 Of note, patient recently hospitalized 07/26 - 08/14 for ETOH withdrawal requiring mechanical intubation with PNA due to recurrent aspiration with AoC CHF. Patient was discharged to [facility]. During this admission, Hgb found to be 4.7. Troponin found to be elevated, patient denies any chest pain.  Cardiology was consulted for further evaluation.   # Acute on chronic hypoxic respiratory failure # COPD exacerbation # Leukocytosis # COVID positive Weaned to baseline O2 is 2L. -Management per primary team, on IV abx.  # Chronic HFpEF Patient does not appear significantly overloaded. CXR with mild pulmonary vascular congestion.  No lower extremity edema.  -Monitor and replenish electrolytes for a goal K >4, Mag >2  -Hold home torsemide  20 mg daily for now, not clinically fluid overloaded at this time. -Resume home  dapagliflozin  10 mg daily.  # Severe aortic stenosis Echo from 04/2024 revealed severe AS. Aortic valve mean gradient measures  39.0 mmHg. Aortic valve peak gradient 66.3 mmHg. Aortic valve area, by VTI 0.49 cm.   -Originally scheduled to see Dr. Katina 7/30 to discuss aortic valve repair/replacement options, this has been rescheduled because patient was intubated/sedated due to ETOH withdrawal at this time. Appointment on 09/03 at [facility].   # Demand ischemia # Coronary artery disease s/p CABG x3 (04/2012) s/p recent stent (07/02/24) # Hypertension # Hyperlipidemia # Hx Ventricular Ectopy Patient without chest pain. Trops elevated and flat 888 > 793 . EKG with no acute ischemic changes (similar to prior EKG).   -Resume home plavix  75 mg and aspirin  81 mg daily. Per GI cleared patient to resume with no evidence of active bleeding at this time. Closely monitor H&H.  -Continue Crestor  20 mg daily for high-intensity statin management.  -Continue home amio 200 mg daily for hx ventricular ectopy. Per tele this admission no evidence of PVCs. Will continue to monitor tele.  -Continue home losartan  25 mg, metoprolol  succinate 25 mg daily. -Will plan to resume home Imdur  tomorrow if BP remains stable -Recommend MAP > 65.  -Elevated and flat trops in setting of GI bleed with severe anemia Hgb 4.7 is most consistent with demand/supply mismatch and not ACS.  No further cardiac diagnostics at this time.   # GI bleed # Acute on chronic anemia # Hx AVM Upon admission Hgb 4.7. Patient received 2 units PRBCs and 1 unit of platelets. Hgb this AM, 8.0. -Closely monitor H&H. -Goal Hgb >8 due to cardiac history. Recommend transfusion when <8.  -Management per primary team.    This patient's plan of care was discussed and created with Dr. Ammon and he is in agreement.  Signed: Dorene Comfort, PA-C  08/15/2024, 9:03 AM [facility]

## 2024-08-15 NOTE — Plan of Care (Signed)
  Problem: Education: Goal: Knowledge of General Education information will improve Description: Including pain rating scale, medication(s)/side effects and non-pharmacologic comfort measures Outcome: Progressing   Problem: Clinical Measurements: Goal: Ability to maintain clinical measurements within normal limits will improve Outcome: Progressing Goal: Will remain free from infection Outcome: Progressing Goal: Diagnostic test results will improve Outcome: Progressing Goal: Respiratory complications will improve Outcome: Progressing Goal: Cardiovascular complication will be avoided Outcome: Progressing   Problem: Activity: Goal: Risk for activity intolerance will decrease Outcome: Progressing   Problem: Coping: Goal: Level of anxiety will decrease Outcome: Progressing   Problem: Pain Managment: Goal: General experience of comfort will improve and/or be controlled Outcome: Progressing   Problem: Safety: Goal: Ability to remain free from injury will improve Outcome: Progressing   Problem: Skin Integrity: Goal: Risk for impaired skin integrity will decrease Outcome: Progressing   Problem: Respiratory: Goal: Will maintain a patent airway Outcome: Progressing Goal: Complications related to the disease process, condition or treatment will be avoided or minimized Outcome: Progressing

## 2024-08-16 ENCOUNTER — Inpatient Hospital Stay

## 2024-08-16 DIAGNOSIS — R0602 Shortness of breath: Secondary | ICD-10-CM | POA: Diagnosis not present

## 2024-08-16 DIAGNOSIS — I5033 Acute on chronic diastolic (congestive) heart failure: Secondary | ICD-10-CM

## 2024-08-16 DIAGNOSIS — Z8679 Personal history of other diseases of the circulatory system: Secondary | ICD-10-CM | POA: Diagnosis not present

## 2024-08-16 DIAGNOSIS — U071 COVID-19: Secondary | ICD-10-CM | POA: Diagnosis not present

## 2024-08-16 DIAGNOSIS — R079 Chest pain, unspecified: Secondary | ICD-10-CM | POA: Diagnosis not present

## 2024-08-16 DIAGNOSIS — D62 Acute posthemorrhagic anemia: Secondary | ICD-10-CM | POA: Diagnosis not present

## 2024-08-16 DIAGNOSIS — R578 Other shock: Secondary | ICD-10-CM | POA: Diagnosis not present

## 2024-08-16 LAB — CBC
HCT: 30.5 % — ABNORMAL LOW (ref 39.0–52.0)
Hemoglobin: 9.4 g/dL — ABNORMAL LOW (ref 13.0–17.0)
MCH: 28.3 pg (ref 26.0–34.0)
MCHC: 30.8 g/dL (ref 30.0–36.0)
MCV: 91.9 fL (ref 80.0–100.0)
Platelets: 278 K/uL (ref 150–400)
RBC: 3.32 MIL/uL — ABNORMAL LOW (ref 4.22–5.81)
RDW: 16 % — ABNORMAL HIGH (ref 11.5–15.5)
WBC: 15.4 K/uL — ABNORMAL HIGH (ref 4.0–10.5)
nRBC: 0 % (ref 0.0–0.2)

## 2024-08-16 LAB — GLUCOSE, CAPILLARY
Glucose-Capillary: 102 mg/dL — ABNORMAL HIGH (ref 70–99)
Glucose-Capillary: 118 mg/dL — ABNORMAL HIGH (ref 70–99)
Glucose-Capillary: 125 mg/dL — ABNORMAL HIGH (ref 70–99)
Glucose-Capillary: 131 mg/dL — ABNORMAL HIGH (ref 70–99)
Glucose-Capillary: 174 mg/dL — ABNORMAL HIGH (ref 70–99)
Glucose-Capillary: 182 mg/dL — ABNORMAL HIGH (ref 70–99)
Glucose-Capillary: 202 mg/dL — ABNORMAL HIGH (ref 70–99)

## 2024-08-16 LAB — BPAM RBC
Blood Product Expiration Date: 202509142359
Blood Product Expiration Date: 202509212359
Blood Product Expiration Date: 202509212359
Blood Product Expiration Date: 202509282359
Blood Product Expiration Date: 202509282359
ISSUE DATE / TIME: 202508261519
ISSUE DATE / TIME: 202508261519
ISSUE DATE / TIME: 202508261641
ISSUE DATE / TIME: 202508261808
ISSUE DATE / TIME: 202508291228
Unit Type and Rh: 5100
Unit Type and Rh: 5100
Unit Type and Rh: 6200
Unit Type and Rh: 6200
Unit Type and Rh: 6200

## 2024-08-16 LAB — TYPE AND SCREEN
ABO/RH(D): A POS
Antibody Screen: NEGATIVE
Unit division: 0
Unit division: 0
Unit division: 0
Unit division: 0
Unit division: 0

## 2024-08-16 LAB — BASIC METABOLIC PANEL WITH GFR
Anion gap: 9 (ref 5–15)
BUN: 30 mg/dL — ABNORMAL HIGH (ref 8–23)
CO2: 29 mmol/L (ref 22–32)
Calcium: 8.5 mg/dL — ABNORMAL LOW (ref 8.9–10.3)
Chloride: 97 mmol/L — ABNORMAL LOW (ref 98–111)
Creatinine, Ser: 1.36 mg/dL — ABNORMAL HIGH (ref 0.61–1.24)
GFR, Estimated: 58 mL/min — ABNORMAL LOW (ref >60)
Glucose, Bld: 109 mg/dL — ABNORMAL HIGH (ref 70–99)
Potassium: 4.1 mmol/L (ref 3.5–5.1)
Sodium: 135 mmol/L (ref 135–145)

## 2024-08-16 LAB — PHOSPHORUS: Phosphorus: 3.2 mg/dL (ref 2.5–4.6)

## 2024-08-16 MED ORDER — FUROSEMIDE 10 MG/ML IJ SOLN
40.0000 mg | Freq: Once | INTRAMUSCULAR | Status: AC
  Administered 2024-08-16: 40 mg via INTRAVENOUS
  Filled 2024-08-16: qty 4

## 2024-08-16 NOTE — Assessment & Plan Note (Signed)
 No chest pain, history of recent NSTEMI. Troponin peaked at 888, likely secondary to demand ischemia with GI bleed. - Cardiology was consulted -Continue to monitor

## 2024-08-16 NOTE — Progress Notes (Signed)
 Occupational Therapy Treatment Patient Details Name: [name] MRN: [id number] DOB: Aug 19, 1961 Today's Date: 08/16/2024   History of present illness 63 y.o. male with medical history significant of  GIB, alcohol  abuse, COPD on 2 L oxygen, hypertension, CAD with prior CABG and recent Stent placement, HLD, stroke, dementia, dCHF, presents with SOB, cough, chest pain.     Patient was recently hospitalized from 7/25 - 8/14 and d/c'd to rehab facility.  Returns after syncopal epsisode with weakness and GI bleed - recieved blood, found to be Covid +   OT comments  Pt seen for OT treatment on this date. Upon arrival to room pt seated in the recliner, agreeable to tx. Pt requires CGA for STS from recliner with minimal verbal cues for technique/hand placement. Pt amb within room ~11ft with use of RW throughout. Pt on 2.5L via [place] with spo2 levels <90 pre/most mobility. Pt required cues to pace self and take rest breaks when feeling SOB. Pt reports increased SOB when LB dressing, requiring assistance to don/doff. Brought an incentive spirometer to pt room per his request after misplacing prior device used. Pt demonstrated good carryover of instructions with use of incentive spirometer. Pt making good progress toward goals, will continue to follow POC. Discharge recommendation remains appropriate.        If plan is discharge home, recommend the following:  A lot of help with bathing/dressing/bathroom;Assistance with cooking/housework;Direct supervision/assist for medications management;Assist for transportation;A little help with walking and/or transfers   Equipment Recommendations  Other (comment)    Recommendations for Other Services      Precautions / Restrictions Precautions Precautions: Fall Recall of Precautions/Restrictions: Intact Restrictions Weight Bearing Restrictions Per Provider Order: No       Mobility Bed Mobility               General bed mobility comments: NT in  recliner pre/post session    Transfers Overall transfer level: Needs assistance Equipment used: Rolling walker (2 wheels) Transfers: Sit to/from Stand Sit to Stand: Contact guard assist     Step pivot transfers: Contact guard assist     General transfer comment: Pt requires MIN cues for technique to come to sucessful static standing, no physical assistance to complete.     Balance Overall balance assessment: Needs assistance Sitting-balance support: Feet supported Sitting balance-Leahy Scale: Good Sitting balance - Comments: Recieved sitting on the edge of recliner with bilateral feet on the floor and UE supporting on tray table. Sitting tolerance has greatly improved.   Standing balance support: During functional activity, Bilateral upper extremity supported Standing balance-Leahy Scale: Fair Standing balance comment: Heavy reliance on RW in standing, pt endorses fear of SOB during activity                           ADL either performed or assessed with clinical judgement   ADL Overall ADL's : Needs assistance/impaired Eating/Feeding: Set up;Sitting   Grooming: Set up;Wash/dry face;Sitting               Lower Body Dressing: Sitting/lateral leans;Maximal assistance Lower Body Dressing Details (indicate cue type and reason): pt reports increased SOB when LB dressing, requiring assisted to don/doff Toilet Transfer: Contact guard assist;Ambulation;Rolling walker (2 wheels) Toilet Transfer Details (indicate cue type and reason): Simulated transfers into recliner         Functional mobility during ADLs: Contact guard assist;Rolling walker (2 wheels)       Communication Communication Communication: Impaired  Factors Affecting Communication: Hearing impaired   Cognition Arousal: Alert Behavior During Therapy: WFL for tasks assessed/performed Cognition: Cognition impaired   Orientation impairments: Situation Awareness: Intellectual awareness impaired,  Online awareness impaired Memory impairment (select all impairments): Short-term memory Attention impairment (select first level of impairment): Selective attention Executive functioning impairment (select all impairments): Problem solving                   Following commands: Intact Following commands impaired: Follows multi-step commands inconsistently      Cueing   Cueing Techniques: Verbal cues  Exercises Exercises: Other exercises Other Exercises Other Exercises: Edu: Role of OT session, safe transfer technique, incentive spirometer routine during down time           General Comments Pt pleasant throughout session, worried about author getting sick from him. Ensured we are taking measures to remain safe during sessions.    Pertinent Vitals/ Pain       Pain Assessment Pain Assessment: No/denies pain                                                          Frequency  Min 2X/week        Progress Toward Goals  OT Goals(current goals can now be found in the care plan section)  Progress towards OT goals: Progressing toward goals  Acute Rehab OT Goals OT Goal Formulation: With patient Time For Goal Achievement: 08/28/24 Potential to Achieve Goals: Fair ADL Goals Pt Will Perform Grooming: with modified independence;sitting;standing Pt Will Perform Lower Body Dressing: with modified independence;sitting/lateral leans;sit to/from stand Pt Will Transfer to Toilet: with modified independence;ambulating Pt Will Perform Toileting - Clothing Manipulation and hygiene: with modified independence;sitting/lateral leans;sit to/from stand  Plan      Co-evaluation                 AM-PAC OT 6 Clicks Daily Activity     Outcome Measure   Help from another person eating meals?: None Help from another person taking care of personal grooming?: None Help from another person toileting, which includes using toliet, bedpan, or urinal?: A  Little Help from another person bathing (including washing, rinsing, drying)?: A Little Help from another person to put on and taking off regular upper body clothing?: A Little Help from another person to put on and taking off regular lower body clothing?: A Lot 6 Click Score: 19    End of Session Equipment Utilized During Treatment: Gait belt;Rolling walker (2 wheels);Oxygen  OT Visit Diagnosis: Muscle weakness (generalized) (M62.81);History of falling (Z91.81)   Activity Tolerance Patient tolerated treatment well   Patient Left in chair;with call bell/phone within reach   Nurse Communication Mobility status        Time: 1341-1411 OT Time Calculation (min): 30 min  Charges: OT General Charges $OT Visit: 1 Visit OT Treatments $Self Care/Home Management : 8-22 mins $Therapeutic Activity: 8-22 mins  Larraine Colas M.S. OTR/L  08/16/24, 2:38 PM

## 2024-08-16 NOTE — Assessment & Plan Note (Signed)
 Patient has a scheduled outpatient appointment with Dr. Katina as he need evaluation for any possible procedure for severe aortic stenosis.

## 2024-08-16 NOTE — Progress Notes (Signed)
 SUBJECTIVE: Patient has some shortness of breath and pleuritic chest pain.   Vitals:   08/16/24 0500 08/16/24 0724 08/16/24 0821 08/16/24 1222  BP:  (!) 141/84 100/68 99/61  Pulse:  83 80 79  Resp:   19 19  Temp:   98 F (36.7 C) 97.6 F (36.4 C)  TempSrc:   Oral Oral  SpO2:  100% 100% 100%  Weight: 100.8 kg     Height:        Intake/Output Summary (Last 24 hours) at 08/16/2024 1249 Last data filed at 08/16/2024 0900 Gross per 24 hour  Intake 928.5 ml  Output 1700 ml  Net -771.5 ml    LABS: Basic Metabolic Panel: Recent Labs    08/14/24 0311 08/15/24 0703 08/16/24 0600  NA 135 130* 135  K 4.8 4.5 4.1  CL 99 97* 97*  CO2 26 26 29   GLUCOSE 164* 155* 109*  BUN 27* 31* 30*  CREATININE 1.37* 1.37* 1.36*  CALCIUM  8.3* 8.4* 8.5*  MG 2.1 2.3  --   PHOS 3.2 3.3 3.2   Liver Function Tests: No results for input(s): AST, ALT, ALKPHOS, BILITOT, PROT, ALBUMIN  in the last 72 hours. No results for input(s): LIPASE, AMYLASE in the last 72 hours. CBC: Recent Labs    08/14/24 1026 08/14/24 1551 08/15/24 0703 08/16/24 0600  WBC 11.5* 15.6* 13.6* 15.4*  NEUTROABS 10.5* 13.3*  --   --   HGB 8.5* 8.8* 8.0* 9.4*  HCT 27.1* 27.5* 26.1* 30.5*  MCV 90.0 87.9 92.2 91.9  PLT 303 263 284 278   Cardiac Enzymes: No results for input(s): CKTOTAL, CKMB, CKMBINDEX, TROPONINI in the last 72 hours. BNP: Invalid input(s): POCBNP D-Dimer: No results for input(s): DDIMER in the last 72 hours. Hemoglobin A1C: No results for input(s): HGBA1C in the last 72 hours. Fasting Lipid Panel: No results for input(s): CHOL, HDL, LDLCALC, TRIG, CHOLHDL, LDLDIRECT in the last 72 hours. Thyroid Function Tests: No results for input(s): TSH, T4TOTAL, T3FREE, THYROIDAB in the last 72 hours.  Invalid input(s): FREET3 Anemia Panel: No results for input(s): VITAMINB12, FOLATE, FERRITIN, TIBC, IRON , RETICCTPCT in the last 72  hours.   PHYSICAL EXAM General: Well developed, well nourished, in no acute distress HEENT:  Normocephalic and atramatic Neck:  No JVD.  Lungs: Clear bilaterally to auscultation and percussion. Heart: HRRR . Normal S1 and S2 without gallops or murmurs.  Abdomen: Bowel sounds are positive, abdomen soft and non-tender  Msk:  Back normal, normal gait. Normal strength and tone for age. Extremities: No clubbing, cyanosis or edema.   Neuro: Alert and oriented X 3. Psych:  Good affect, responds appropriately  TELEMETRY: Sinus rhythm  ASSESSMENT AND PLAN: #1 COVID-positive on 2 L oxygen. #2 chronic HFrEF.  Patient appears to be euvolemic no peripheral edema torsemide  20 mg is on hold.  Continue Farxiga . #2 severe aortic stenosis seen on echocardiogram in May 25, Dr. Katina discussed on 730 aortic valve replacement option but has been deferred.  He has an appointment on 9 3 for this follow-up. #3 demand ischemia with history of three-vessel CABG and stent on 07/02/2024.  Peak troponin was 888 EKG unchanged.  Continue medical therapy with aspirin  and Plavix .  Continue 200 mg of amiodarone  and Crestor  20 mg once a day.   ICD-10-CM   1. Hemorrhagic shock (HCC)  R57.8     2. Gastrointestinal hemorrhage, unspecified gastrointestinal hemorrhage type  K92.2     3. NSTEMI (non-ST elevated myocardial infarction) (HCC)  I21.4  Principal Problem:   GI bleed Active Problems:   COPD with acute exacerbation (HCC)   Elevated troponin   Acute blood loss anemia   Severe aortic stenosis   Acute on chronic diastolic CHF (congestive heart failure) (HCC)   Hemorrhagic shock (HCC)   History of CAD (coronary artery disease)   COVID-19 virus infection   Acute on chronic hypoxic respiratory failure [facility])    Denyse Bathe, MD, [facility] 08/16/2024 12:49 PM

## 2024-08-16 NOTE — Assessment & Plan Note (Signed)
 Patient also has severe aortic stenosis.  Patient on metoprolol  and Cozaar .

## 2024-08-16 NOTE — Assessment & Plan Note (Signed)
 Patient uses 2 L of oxygen at baseline.  Patient up to 4 L today - Continue with supplemental oxygen-wean to baseline as tolerated

## 2024-08-16 NOTE — Progress Notes (Signed)
 Progress Note   Patient: [name] FMW:[id number] DOB: 1961-01-10 DOA: 08/12/2024     4 DOS: the patient was seen and examined on 08/16/2024   Brief hospital course: Partly taken from prior notes.  [name] is a 63 y.o. male PMH multiple medical comorbidities including COPD on 2 L nasal cannula, CAD with prior CABG and recent stent placement, HLD, stroke, dementia, diastolic CHF, prior GI bleed not on anticoagulation (was on dual antiplatelet therapy) presents for evaluation of reported syncopal episode, apparently patient has a bloody bowel movement.  Pt has a hx of AVM's undergone multiple bidrectional endoscopies without findings of bleeding within the last 2 yrs.   He was recently discharged from [facility] following a prolonged hospitalization 07/26-08/14 following treatment of ETOH withdrawal requiring mechanical intubation due to worsening acute respiratory failure secondary to pneumonia due to recurrent aspiration along with acute on chronic diastolic CHF and cardiac arrest.  He was discharged to [facility].   On presentation patient was hypotensive in 70s, labs with sodium of 133, BUN 33, creatinine 1.68, BNP 1514, troponin 888, lactic acid 3.2, leukocytosis at 12.2 and hemoglobin of 4.7. Chest x-ray concerning for pulmonary vascular congestion and small pleural effusion.  Patient was initially admitted in ICU for concern of hemorrhagic shock secondary to acute GI bleed.  GI was consulted. 2 unit PRBC ordered.  Home aspirin  and Plavix  was held.  8/27: Vital stable, did not require any pressors, hemoglobin improved to 8.3, WBC 15, sodium 134, BUN 28, creatinine 1.56, T. bili at 1.3, MRSA PCR positive, respiratory panel positive for COVID.  Patient received 2 unit of PRBC and 1 unit of platelets. Troponin peaked at 888.  Adding steroid and increasing the frequency of DuoNeb due to developing wheezing and shortness of breath.  Starting on cefepime  and vancomycin  while  checking procalcitonin due to worsening leukocytosis and shortness of breath.  Patient with significant underlying comorbidities and high risk for mortality, palliative care was also consulted.  8/28: Hemodynamically stable, he was placed on heated high flow overnight, now weaned back to baseline O2 of 2 L. No more bloody bowel movement but hemoglobin decreased to 8 today.  Improving creatinine.  Procalcitonin was negative so antibiotics were discontinued.  8/29: Hemodynamically stable, on 2 L of oxygen.  Hemoglobin with further decreased to 8 today.  Giving 1 unit of PRBC due to recent NSTEMI.  Restarting aspirin  and Plavix  as there is no active bleeding but he did not had any bowel movement since in the hospital-working on bowel regimen.  8/30.  Hemoglobin 9.4.  Patient complains of shortness of breath today.  Will give a dose of Lasix  and chest x-ray is negative except for nodular opacity right upper lung seen on prior CT scan.  Assessment and Plan: * GI bleed Acute blood loss anemia. 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   Patient received 5 units of packed red blood cells - Aspirin  and Plavix  are being restarted.     Hemorrhagic shock (HCC) Initial concern of hemorrhagic shock with significant hypotension so he was admitted in ICU. Levophed  was ordered but never given. - Blood pressure now within goal -Continue to monitor -Supportive care  COVID-19 virus infection Prednisone  taper  Acute on chronic diastolic CHF (congestive heart failure) (HCC) Patient also has severe aortic stenosis.  1 dose of Lasix  today.  Patient on metoprolol  and Cozaar   COPD with acute exacerbation (HCC) Prednisone  taper  Elevated troponin No  chest pain, history of recent NSTEMI. Troponin peaked at 888, likely secondary to demand ischemia with GI bleed. - Cardiology was consulted -Continue to monitor  History of CAD (coronary artery disease) Patient with  history of NSTEMI s/p PCI in July 2025 Troponin elevated, likely secondary to demand with GI bleed. Initially aspirin  and Plavix  was held but restarted.  Acute on chronic hypoxic respiratory failure (HCC) Patient uses 2 L of oxygen at baseline.  Patient up to 4 L today - Continue with supplemental oxygen-wean to baseline as tolerated  Severe aortic stenosis Patient has a scheduled outpatient appointment with Dr. Katina as he need evaluation for any possible procedure for severe aortic stenosis.        Subjective: Patient complains of shortness of breath while lying flat.  Still little short of breath when sitting up.  Physical Exam: Vitals:   08/16/24 0500 08/16/24 0724 08/16/24 0821 08/16/24 1222  BP:  (!) 141/84 100/68 99/61  Pulse:  83 80 79  Resp:   19 19  Temp:   98 F (36.7 C) 97.6 F (36.4 C)  TempSrc:   Oral Oral  SpO2:  100% 100% 100%  Weight: 100.8 kg     Height:       Physical Exam HENT:     Head: Normocephalic.     Mouth/Throat:     Pharynx: No oropharyngeal exudate.  Eyes:     General: Lids are normal.     Conjunctiva/sclera: Conjunctivae normal.  Cardiovascular:     Rate and Rhythm: Normal rate and regular rhythm.     Heart sounds: Normal heart sounds, S1 normal and S2 normal.  Pulmonary:     Effort: No accessory muscle usage.     Breath sounds: Examination of the right-middle field reveals decreased breath sounds. Examination of the left-middle field reveals decreased breath sounds. Examination of the right-lower field reveals decreased breath sounds. Examination of the left-lower field reveals decreased breath sounds. Decreased breath sounds present. No wheezing, rhonchi or rales.  Abdominal:     Palpations: Abdomen is soft.     Tenderness: There is no abdominal tenderness.  Musculoskeletal:     Right lower leg: No swelling.     Left lower leg: No swelling.  Skin:    General: Skin is warm.     Findings: No rash.  Neurological:     Mental Status:  He is alert and oriented to person, place, and time.     Data Reviewed: Chest x-ray shows no acute process, stable nodular opacity right upper lung laterally similar to previous CT scan of the chest Sodium 135 creatinine 1.36 with a GFR 58 white blood cell count 15.4, hemoglobin 9.4, platelet count 278 procalcitonin negative   Disposition: Status is: Inpatient Remains inpatient appropriate because: Patient received 5 units of blood while here.  Planned Discharge Destination: Rehab    Time spent: 28 minutes  Author: Charlie Patterson, MD 08/16/2024 12:30 PM  For on call review [email or web address].

## 2024-08-17 DIAGNOSIS — R578 Other shock: Secondary | ICD-10-CM | POA: Diagnosis not present

## 2024-08-17 DIAGNOSIS — U071 COVID-19: Secondary | ICD-10-CM | POA: Diagnosis not present

## 2024-08-17 DIAGNOSIS — I5033 Acute on chronic diastolic (congestive) heart failure: Secondary | ICD-10-CM | POA: Diagnosis not present

## 2024-08-17 DIAGNOSIS — D62 Acute posthemorrhagic anemia: Secondary | ICD-10-CM | POA: Diagnosis not present

## 2024-08-17 DIAGNOSIS — E669 Obesity, unspecified: Secondary | ICD-10-CM | POA: Insufficient documentation

## 2024-08-17 LAB — CULTURE, BLOOD (ROUTINE X 2)
Culture: NO GROWTH
Culture: NO GROWTH

## 2024-08-17 LAB — GLUCOSE, CAPILLARY
Glucose-Capillary: 182 mg/dL — ABNORMAL HIGH (ref 70–99)
Glucose-Capillary: 160 mg/dL — ABNORMAL HIGH (ref 70–99)
Glucose-Capillary: 216 mg/dL — ABNORMAL HIGH (ref 70–99)
Glucose-Capillary: 186 mg/dL — ABNORMAL HIGH (ref 70–99)
Glucose-Capillary: 233 mg/dL — ABNORMAL HIGH (ref 70–99)
Glucose-Capillary: 192 mg/dL — ABNORMAL HIGH (ref 70–99)

## 2024-08-17 LAB — BASIC METABOLIC PANEL WITH GFR
Anion gap: 10 (ref 5–15)
BUN: 30 mg/dL — ABNORMAL HIGH (ref 8–23)
CO2: 28 mmol/L (ref 22–32)
Calcium: 8.4 mg/dL — ABNORMAL LOW (ref 8.9–10.3)
Chloride: 97 mmol/L — ABNORMAL LOW (ref 98–111)
Creatinine, Ser: 1.37 mg/dL — ABNORMAL HIGH (ref 0.61–1.24)
GFR, Estimated: 57 mL/min — ABNORMAL LOW (ref >60)
Glucose, Bld: 173 mg/dL — ABNORMAL HIGH (ref 70–99)
Potassium: 4.6 mmol/L (ref 3.5–5.1)
Sodium: 135 mmol/L (ref 135–145)

## 2024-08-17 LAB — CBC
HCT: 31.9 % — ABNORMAL LOW (ref 39.0–52.0)
Hemoglobin: 9.8 g/dL — ABNORMAL LOW (ref 13.0–17.0)
MCH: 28.4 pg (ref 26.0–34.0)
MCHC: 30.7 g/dL (ref 30.0–36.0)
MCV: 92.5 fL (ref 80.0–100.0)
Platelets: 219 K/uL (ref 150–400)
RBC: 3.45 MIL/uL — ABNORMAL LOW (ref 4.22–5.81)
RDW: 15.7 % — ABNORMAL HIGH (ref 11.5–15.5)
WBC: 9.6 K/uL (ref 4.0–10.5)
nRBC: 0 % (ref 0.0–0.2)

## 2024-08-17 LAB — PHOSPHORUS: Phosphorus: 2.9 mg/dL (ref 2.5–4.6)

## 2024-08-17 MED ORDER — PREDNISONE 20 MG PO TABS
30.0000 mg | ORAL_TABLET | Freq: Every day | ORAL | Status: AC
  Administered 2024-08-18: 30 mg via ORAL
  Filled 2024-08-17: qty 1

## 2024-08-17 NOTE — Assessment & Plan Note (Signed)
 Creatinine 1.42 with a GFR 55

## 2024-08-17 NOTE — Assessment & Plan Note (Signed)
 Class I with a BMI of 30.95

## 2024-08-17 NOTE — Plan of Care (Signed)
  Problem: Education: Goal: Knowledge of General Education information will improve Description: Including pain rating scale, medication(s)/side effects and non-pharmacologic comfort measures 08/17/2024 0546 by Vicci Verla CROME, RN Outcome: Progressing 08/17/2024 0545 by Vicci Verla CROME, RN Outcome: Progressing   Problem: Health Behavior/Discharge Planning: Goal: Ability to manage health-related needs will improve 08/17/2024 0546 by Vicci Verla CROME, RN Outcome: Progressing 08/17/2024 0545 by Vicci Verla CROME, RN Outcome: Progressing   Problem: Clinical Measurements: Goal: Ability to maintain clinical measurements within normal limits will improve 08/17/2024 0546 by Vicci Verla CROME, RN Outcome: Progressing 08/17/2024 0545 by Vicci Verla CROME, RN Outcome: Progressing Goal: Will remain free from infection 08/17/2024 0546 by Vicci Verla CROME, RN Outcome: Progressing 08/17/2024 0545 by Vicci Verla CROME, RN Outcome: Progressing Goal: Diagnostic test results will improve 08/17/2024 0546 by Vicci Verla CROME, RN Outcome: Progressing 08/17/2024 0545 by Vicci Verla CROME, RN Outcome: Progressing Goal: Respiratory complications will improve 08/17/2024 0546 by Vicci Verla CROME, RN Outcome: Progressing 08/17/2024 0545 by Vicci Verla CROME, RN Outcome: Progressing Goal: Cardiovascular complication will be avoided 08/17/2024 0546 by Vicci Verla CROME, RN Outcome: Progressing 08/17/2024 0545 by Vicci Verla CROME, RN Outcome: Progressing   Problem: Activity: Goal: Risk for activity intolerance will decrease 08/17/2024 0546 by Vicci Verla CROME, RN Outcome: Progressing 08/17/2024 0545 by Vicci Verla CROME, RN Outcome: Progressing   Problem: Nutrition: Goal: Adequate nutrition will be maintained 08/17/2024 0546 by Vicci Verla CROME, RN Outcome: Progressing 08/17/2024 0545 by Vicci Verla CROME, RN Outcome: Progressing   Problem: Coping: Goal: Level of anxiety will  decrease 08/17/2024 0546 by Vicci Verla CROME, RN Outcome: Progressing 08/17/2024 0545 by Vicci Verla CROME, RN Outcome: Progressing   Problem: Elimination: Goal: Will not experience complications related to bowel motility 08/17/2024 0546 by Vicci Verla CROME, RN Outcome: Progressing 08/17/2024 0545 by Vicci Verla CROME, RN Outcome: Progressing Goal: Will not experience complications related to urinary retention 08/17/2024 0546 by Vicci Verla CROME, RN Outcome: Progressing 08/17/2024 0545 by Vicci Verla CROME, RN Outcome: Progressing   Problem: Pain Managment: Goal: General experience of comfort will improve and/or be controlled 08/17/2024 0546 by Vicci Verla CROME, RN Outcome: Progressing 08/17/2024 0545 by Vicci Verla CROME, RN Outcome: Progressing   Problem: Safety: Goal: Ability to remain free from injury will improve 08/17/2024 0546 by Vicci Verla CROME, RN Outcome: Progressing 08/17/2024 0545 by Vicci Verla CROME, RN Outcome: Progressing   Problem: Skin Integrity: Goal: Risk for impaired skin integrity will decrease 08/17/2024 0546 by Vicci Verla CROME, RN Outcome: Progressing 08/17/2024 0545 by Vicci Verla CROME, RN Outcome: Progressing   Problem: Education: Goal: Knowledge of risk factors and measures for prevention of condition will improve 08/17/2024 0546 by Vicci Verla CROME, RN Outcome: Progressing 08/17/2024 0545 by Vicci Verla CROME, RN Outcome: Progressing   Problem: Coping: Goal: Psychosocial and spiritual needs will be supported 08/17/2024 0546 by Vicci Verla CROME, RN Outcome: Progressing 08/17/2024 0545 by Vicci Verla CROME, RN Outcome: Progressing   Problem: Respiratory: Goal: Will maintain a patent airway 08/17/2024 0546 by Vicci Verla CROME, RN Outcome: Progressing 08/17/2024 0545 by Vicci Verla CROME, RN Outcome: Progressing Goal: Complications related to the disease process, condition or treatment will be avoided or  minimized 08/17/2024 0546 by Vicci Verla CROME, RN Outcome: Progressing 08/17/2024 0545 by Vicci Verla CROME, RN Outcome: Progressing

## 2024-08-17 NOTE — Progress Notes (Addendum)
 Progress Note   Patient: [name] FMW:[id number] DOB: November 09, 1961 DOA: 08/12/2024     5 DOS: the patient was seen and examined on 08/17/2024   Brief hospital course: Partly taken from prior notes.  [name] is a 63 y.o. male PMH multiple medical comorbidities including COPD on 2 L nasal cannula, CAD with prior CABG and recent stent placement, HLD, stroke, dementia, diastolic CHF, prior GI bleed not on anticoagulation (was on dual antiplatelet therapy) presents for evaluation of reported syncopal episode, apparently patient has a bloody bowel movement.  Pt has a hx of AVM's undergone multiple bidrectional endoscopies without findings of bleeding within the last 2 yrs.   He was recently discharged from [facility] following a prolonged hospitalization 07/26-08/14 following treatment of ETOH withdrawal requiring mechanical intubation due to worsening acute respiratory failure secondary to pneumonia due to recurrent aspiration along with acute on chronic diastolic CHF and cardiac arrest.  He was discharged to [facility].   On presentation patient was hypotensive in 70s, labs with sodium of 133, BUN 33, creatinine 1.68, BNP 1514, troponin 888, lactic acid 3.2, leukocytosis at 12.2 and hemoglobin of 4.7. Chest x-ray concerning for pulmonary vascular congestion and small pleural effusion.  Patient was initially admitted in ICU for concern of hemorrhagic shock secondary to acute GI bleed.  GI was consulted. 2 unit PRBC ordered.  Home aspirin  and Plavix  was held.  8/27: Vital stable, did not require any pressors, hemoglobin improved to 8.3, WBC 15, sodium 134, BUN 28, creatinine 1.56, T. bili at 1.3, MRSA PCR positive, respiratory panel positive for COVID.  Patient received 2 unit of PRBC and 1 unit of platelets. Troponin peaked at 888.  Adding steroid and increasing the frequency of DuoNeb due to developing wheezing and shortness of breath.  Starting on cefepime  and vancomycin  while  checking procalcitonin due to worsening leukocytosis and shortness of breath.  Patient with significant underlying comorbidities and high risk for mortality, palliative care was also consulted.  8/28: Hemodynamically stable, he was placed on heated high flow overnight, now weaned back to baseline O2 of 2 L. No more bloody bowel movement but hemoglobin decreased to 8 today.  Improving creatinine.  Procalcitonin was negative so antibiotics were discontinued.  8/29: Hemodynamically stable, on 2 L of oxygen.  Hemoglobin with further decreased to 8 today.  Giving 1 unit of PRBC due to recent NSTEMI.  Restarting aspirin  and Plavix  as there is no active bleeding but he did not had any bowel movement since in the hospital-working on bowel regimen.  8/30.  Hemoglobin 9.4.  Patient complains of shortness of breath today.  Will give a dose of Lasix  and chest x-ray is negative except for nodular opacity right upper lung seen on prior CT scan. 8/31.  Patient was tapered off oxygen today.  Hemoglobin 9.8, creatinine 1.37  Assessment and Plan: * GI bleed Acute blood loss anemia. 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   Patient received 5 units of packed red blood cells - Aspirin  and Plavix  restarted on 8/29. - Today's hemoglobin 9.8     Hemorrhagic shock (HCC) Initial concern of hemorrhagic shock with significant hypotension so he was admitted in ICU. Levophed  was ordered but never given. - Blood pressure now within goal -Continue to monitor -Supportive care  COVID-19 virus infection Prednisone  taper  Acute on chronic diastolic CHF (congestive heart failure) (HCC) Patient also has severe aortic stenosis.  1 dose of Lasix  yesterday.  Patient on metoprolol  and Cozaar   COPD with acute exacerbation (HCC) Prednisone  taper  Elevated troponin No chest pain, history of recent NSTEMI. Troponin peaked at 888, likely secondary to demand ischemia with GI bleed. -  Cardiology was consulted -Continue to monitor  History of CAD (coronary artery disease) Patient with history of NSTEMI s/p PCI in July 2025 Troponin elevated, likely secondary to demand with GI bleed. Initially aspirin  and Plavix  was held but restarted.  Acute on chronic hypoxic respiratory failure [facility]) Nursing staff able to taper him off oxygen.  Continue to watch closely since patient may have chronic oxygen 2 L.  Severe aortic stenosis Patient has a scheduled outpatient appointment with Dr. Katina as he need evaluation for any possible procedure for severe aortic stenosis.  Obesity (BMI 30-39.9) Class I with a BMI of 30.47  CKD stage 3a, GFR 45-59 ml/min (HCC) Creatinine 1.32 with a GFR 58        Subjective: Patient feels his breathing is a little bit better today.  Still feels a little short of breath.  Tapered off oxygen.  Admitted with hypotension and GI bleed  Physical Exam: Vitals:   08/16/24 2340 08/17/24 0500 08/17/24 0502 08/17/24 0842  BP: 125/75  115/74 115/75  Pulse: 83  77 77  Resp: 20  19 18   Temp: 98 F (36.7 C)  97.7 F (36.5 C) 97.8 F (36.6 C)  TempSrc:      SpO2: 100%  100% 100%  Weight:  101.9 kg    Height:       Physical Exam HENT:     Head: Normocephalic.     Mouth/Throat:     Pharynx: No oropharyngeal exudate.  Eyes:     General: Lids are normal.     Conjunctiva/sclera: Conjunctivae normal.  Cardiovascular:     Rate and Rhythm: Normal rate and regular rhythm.     Heart sounds: Normal heart sounds, S1 normal and S2 normal.  Pulmonary:     Effort: No accessory muscle usage.     Breath sounds: Examination of the right-middle field reveals decreased breath sounds. Examination of the left-middle field reveals decreased breath sounds. Examination of the right-lower field reveals decreased breath sounds. Examination of the left-lower field reveals decreased breath sounds. Decreased breath sounds present. No wheezing, rhonchi or rales.   Abdominal:     Palpations: Abdomen is soft.     Tenderness: There is no abdominal tenderness.  Musculoskeletal:     Right lower leg: No swelling.     Left lower leg: No swelling.  Skin:    General: Skin is warm.     Findings: No rash.  Neurological:     Mental Status: He is alert and oriented to person, place, and time.     Data Reviewed: Creatinine 1.36, BUN 30, GFR 58, white blood cell count 15.4, hemoglobin 9.4, platelet count 278 Family Communication: Declined  Disposition: Status is: Inpatient Continue to taper prednisone .  Here manager looking into rehab facilities.  Planned Discharge Destination: Rehab    Time spent: 27 minutes  Author: Charlie Patterson, MD 08/17/2024 1:28 PM  For on call review [email or web address].

## 2024-08-18 DIAGNOSIS — I214 Non-ST elevation (NSTEMI) myocardial infarction: Secondary | ICD-10-CM

## 2024-08-18 DIAGNOSIS — R578 Other shock: Secondary | ICD-10-CM | POA: Diagnosis not present

## 2024-08-18 DIAGNOSIS — R0602 Shortness of breath: Secondary | ICD-10-CM | POA: Diagnosis not present

## 2024-08-18 DIAGNOSIS — N1831 Chronic kidney disease, stage 3a: Secondary | ICD-10-CM

## 2024-08-18 DIAGNOSIS — Z8679 Personal history of other diseases of the circulatory system: Secondary | ICD-10-CM | POA: Diagnosis not present

## 2024-08-18 DIAGNOSIS — E669 Obesity, unspecified: Secondary | ICD-10-CM

## 2024-08-18 DIAGNOSIS — U071 COVID-19: Secondary | ICD-10-CM | POA: Diagnosis not present

## 2024-08-18 DIAGNOSIS — R079 Chest pain, unspecified: Secondary | ICD-10-CM | POA: Diagnosis not present

## 2024-08-18 DIAGNOSIS — I5033 Acute on chronic diastolic (congestive) heart failure: Secondary | ICD-10-CM | POA: Diagnosis not present

## 2024-08-18 DIAGNOSIS — D62 Acute posthemorrhagic anemia: Secondary | ICD-10-CM | POA: Diagnosis not present

## 2024-08-18 LAB — GLUCOSE, CAPILLARY
Glucose-Capillary: 134 mg/dL — ABNORMAL HIGH (ref 70–99)
Glucose-Capillary: 169 mg/dL — ABNORMAL HIGH (ref 70–99)
Glucose-Capillary: 197 mg/dL — ABNORMAL HIGH (ref 70–99)
Glucose-Capillary: 199 mg/dL — ABNORMAL HIGH (ref 70–99)
Glucose-Capillary: 226 mg/dL — ABNORMAL HIGH (ref 70–99)
Glucose-Capillary: 302 mg/dL — ABNORMAL HIGH (ref 70–99)

## 2024-08-18 LAB — HEMOGLOBIN: Hemoglobin: 9.5 g/dL — ABNORMAL LOW (ref 13.0–17.0)

## 2024-08-18 MED ORDER — IRON SUCROSE 300 MG IVPB - SIMPLE MED
300.0000 mg | Freq: Once | Status: AC
Start: 2024-08-18 — End: 2024-08-19
  Administered 2024-08-18: 300 mg via INTRAVENOUS
  Filled 2024-08-18: qty 300

## 2024-08-18 MED ORDER — PREDNISONE 20 MG PO TABS
20.0000 mg | ORAL_TABLET | Freq: Every day | ORAL | Status: DC
  Administered 2024-08-19 – 2024-08-21 (×3): 20 mg via ORAL
  Filled 2024-08-18 (×3): qty 1

## 2024-08-18 NOTE — TOC Progression Note (Signed)
 Transition of Care [facility]) - Progression Note    Patient Details  Name: [name] MRN: [id number] Date of Birth: 15-Jul-1961  Transition of Care [facility]) CM/SW Contact  Lorraine LILLETTE Fenton, LCSW Phone Number: 08/18/2024, 3:44 PM  Clinical Narrative:     CSW visited pt at bedside, pt was sitting up in bed. Inquired about SNF return , pt agreed that he does not plan to return to [facility] indicates that he has personal items there and CSW advised he has to arrange to pick those  things up. T states he is agreeable to going home and will accept [facility] again for HHPT if that is available.  ICM following.    Barriers to Discharge: Continued Medical Work up               Expected Discharge Plan and Services       Living arrangements for the past 2 months: Skilled Nursing Facility, Single Family Home                                       Social Drivers of Health (SDOH) Interventions SDOH Screenings   Food Insecurity: No Food Insecurity (08/13/2024)  Housing: Low Risk  (08/13/2024)  Transportation Needs: No Transportation Needs (08/13/2024)  Utilities: Not At Risk (08/13/2024)  Social Connections: Socially Isolated (08/13/2024)  Tobacco Use: High Risk (08/12/2024)    Readmission Risk Interventions    08/13/2024   12:01 PM 05/14/2024    8:44 AM 10/27/2022   11:28 AM  Readmission Risk Prevention Plan  Transportation Screening Complete  Complete  PCP or Specialist Appt within 5-7 Days   Complete  PCP or Specialist Appt within 3-5 Days  Complete   Home Care Screening   Complete  Medication Review (RN CM)   Complete  HRI or Home Care Consult  Complete   Social Work Consult for Recovery Care Planning/Counseling  Complete   Palliative Care Screening  Not Applicable   Medication Review [employer]) Complete Complete   Palliative Care Screening Not Applicable    Skilled Nursing Facility Complete

## 2024-08-18 NOTE — Progress Notes (Signed)
 Physical Therapy Treatment Patient Details Name: [name] MRN: [id number] DOB: Oct 20, 1961 Today's Date: 08/18/2024   History of Present Illness 63 y.o. male with medical history significant of  GIB, alcohol  abuse, COPD on 2 L oxygen, hypertension, CAD with prior CABG and recent Stent placement, HLD, stroke, dementia, dCHF, presents with SOB, cough, chest pain.     Patient was recently hospitalized from 7/25 - 8/14 and d/c'd to rehab facility.  Returns after syncopal epsisode with weakness and GI bleed - recieved blood, found to be Covid +    PT Comments  Pt received seated in bathroom passing BM. Pt able to perform pericare but asks author to assist to ensure thoroughness. Pt is supervision for STS to RW, completes hand hygiene and returns to sitting Eob to don O2 as pt was not wearing during BM. SPO2 on 3 L/min at 98-100%. Titrated to 2 L/min which is pt's baseline. Maintaining SPO2 98-100% on 2 L/min at rest and with seated recovery. Focus of session on improving endurance and LE strength with multiple gait bouts. Pt able to complete 2x80' bouts with seated rest b/t due to DOE. Pt completes at supervision with close to normalized gait mechanics. Remains slightly limited in step lengths. Pt continues to make progress with gait but does fatigue quickly needing seated rest and PLB to recover for 2-3 min b/t bouts. Pt left seated EOB with all needs in reach. Continuing to encourage OOB mobility. Pt making excellent progress towards POC. D/c recs remain appropriate due to endurance deficits.    If plan is discharge home, recommend the following: A little help with walking and/or transfers;A lot of help with bathing/dressing/bathroom;Assistance with cooking/housework;Assist for transportation;Help with stairs or ramp for entrance   Can travel by private vehicle     Yes  Equipment Recommendations  Rolling walker (2 wheels)    Recommendations for Other Services       Precautions / Restrictions  Precautions Precautions: Fall Restrictions Weight Bearing Restrictions Per Provider Order: No     Mobility  Bed Mobility               General bed mobility comments: NT Patient Response: Cooperative  Transfers Overall transfer level: Needs assistance Equipment used: Rolling walker (2 wheels) Transfers: Sit to/from Stand Sit to Stand: Supervision                Ambulation/Gait Ambulation/Gait assistance: Supervision Gait Distance (Feet): 160 Feet (2x80' in room with seated rest b/t bouts) Assistive device: Rolling walker (2 wheels) Gait Pattern/deviations: Step-through pattern, Decreased step length - right, Decreased step length - left       General Gait Details: completes 2x80' in room. Requires seated rest b/t sets due to fatigue and SOB. SPO2 > 90% on 2-3 L/min   Stairs             Wheelchair Mobility     Tilt Bed Tilt Bed Patient Response: Cooperative  Modified Rankin (Stroke Patients Only)       Balance Overall balance assessment: Needs assistance Sitting-balance support: Feet supported Sitting balance-Leahy Scale: Good     Standing balance support: During functional activity, Bilateral upper extremity supported Standing balance-Leahy Scale: Fair Standing balance comment: light use of RW                            Communication Communication Communication: Impaired Factors Affecting Communication: Hearing impaired  Cognition Arousal: Alert Behavior During Therapy: [facility] for  tasks assessed/performed   PT - Cognitive impairments: No apparent impairments                         Following commands: Intact Following commands impaired: Only follows one step commands consistently    Cueing Cueing Techniques: Verbal cues  Exercises      General Comments        Pertinent Vitals/Pain Pain Assessment Pain Assessment: No/denies pain    Home Living                          Prior Function             PT Goals (current goals can now be found in the care plan section) Acute Rehab PT Goals Patient Stated Goal: to get stronger PT Goal Formulation: With patient Time For Goal Achievement: 08/26/24 Potential to Achieve Goals: Good Progress towards PT goals: Progressing toward goals    Frequency    Min 2X/week      PT Plan      Co-evaluation              AM-PAC PT 6 Clicks Mobility   Outcome Measure  Help needed turning from your back to your side while in a flat bed without using bedrails?: A Little Help needed moving from lying on your back to sitting on the side of a flat bed without using bedrails?: A Little Help needed moving to and from a bed to a chair (including a wheelchair)?: A Little Help needed standing up from a chair using your arms (e.g., wheelchair or bedside chair)?: A Little Help needed to walk in hospital room?: A Little Help needed climbing 3-5 steps with a railing? : A Lot 6 Click Score: 17    End of Session Equipment Utilized During Treatment: Oxygen Activity Tolerance: Patient limited by fatigue;Patient tolerated treatment well Patient left: with bed alarm set (seated EOB) Nurse Communication: Mobility status PT Visit Diagnosis: Muscle weakness (generalized) (M62.81);Difficulty in walking, not elsewhere classified (R26.2);Unsteadiness on feet (R26.81)     Time: 8848-8781 PT Time Calculation (min) (ACUTE ONLY): 27 min  Charges:    $Therapeutic Exercise: 23-37 mins PT General Charges $$ ACUTE PT VISIT: 1 Visit                    Dorina HERO. Fairly IV, PT, DPT Physical Therapist- [facility]  [facility] 08/18/2024, 1:52 PM

## 2024-08-18 NOTE — Progress Notes (Signed)
 SUBJECTIVE: Patient denies any chest pain has shortness of breath but states that he has been always like that due to COPD.   Vitals:   08/18/24 0407 08/18/24 0454 08/18/24 0845 08/18/24 1144  BP: 125/88  116/84 106/71  Pulse: 77  74 70  Resp: 20  20 18   Temp: 97.7 F (36.5 C)  97.6 F (36.4 C) 97.7 F (36.5 C)  TempSrc:      SpO2: 100%  97% 100%  Weight:  101.5 kg    Height:        Intake/Output Summary (Last 24 hours) at 08/18/2024 1255 Last data filed at 08/18/2024 1233 Gross per 24 hour  Intake 1920 ml  Output 3040 ml  Net -1120 ml    LABS: Basic Metabolic Panel: Recent Labs    08/16/24 0600 08/17/24 0658  NA 135 135  K 4.1 4.6  CL 97* 97*  CO2 29 28  GLUCOSE 109* 173*  BUN 30* 30*  CREATININE 1.36* 1.37*  CALCIUM  8.5* 8.4*  PHOS 3.2 2.9   Liver Function Tests: No results for input(s): AST, ALT, ALKPHOS, BILITOT, PROT, ALBUMIN  in the last 72 hours. No results for input(s): LIPASE, AMYLASE in the last 72 hours. CBC: Recent Labs    08/16/24 0600 08/17/24 0658 08/18/24 0511  WBC 15.4* 9.6  --   HGB 9.4* 9.8* 9.5*  HCT 30.5* 31.9*  --   MCV 91.9 92.5  --   PLT 278 219  --    Cardiac Enzymes: No results for input(s): CKTOTAL, CKMB, CKMBINDEX, TROPONINI in the last 72 hours. BNP: Invalid input(s): POCBNP D-Dimer: No results for input(s): DDIMER in the last 72 hours. Hemoglobin A1C: No results for input(s): HGBA1C in the last 72 hours. Fasting Lipid Panel: No results for input(s): CHOL, HDL, LDLCALC, TRIG, CHOLHDL, LDLDIRECT in the last 72 hours. Thyroid Function Tests: No results for input(s): TSH, T4TOTAL, T3FREE, THYROIDAB in the last 72 hours.  Invalid input(s): FREET3 Anemia Panel: No results for input(s): VITAMINB12, FOLATE, FERRITIN, TIBC, IRON , RETICCTPCT in the last 72 hours.   PHYSICAL EXAM General: Well developed, well nourished, in no acute distress HEENT:  Normocephalic  and atramatic Neck:  No JVD.  Lungs: Clear bilaterally to auscultation and percussion. Heart: HRRR . Normal S1 and S2 without gallops or murmurs.  Abdomen: Bowel sounds are positive, abdomen soft and non-tender  Msk:  Back normal, normal gait. Normal strength and tone for age. Extremities: No clubbing, cyanosis or edema.   Neuro: Alert and oriented X 3. Psych:  Good affect, responds appropriately  TELEMETRY: Sinus rhythm  A/P, : #1 COVID-positive on 2 L oxygen. #2 chronic HFrEF.  Patient appears to be euvolemic no peripheral edema torsemide  20 mg is on hold.  Continue Farxiga . #2 severe aortic stenosis seen on echocardiogram in May 25, Dr. Katina discussed on 730 aortic valve replacement option but has been deferred.  He has an appointment on 9 3 for this follow-up. #3 demand ischemia with history of three-vessel CABG and stent on 07/02/2024.  Peak troponin was 888 EKG unchanged.  Continue medical therapy with aspirin  and Plavix .  Continue 200 mg of amiodarone  and Crestor  20 mg once a day    ICD-10-CM   1. Hemorrhagic shock (HCC)  R57.8     2. Gastrointestinal hemorrhage, unspecified gastrointestinal hemorrhage type  K92.2     3. NSTEMI (non-ST elevated myocardial infarction) (HCC)  I21.4       Principal Problem:   GI bleed Active Problems:  COPD with acute exacerbation (HCC)   Elevated troponin   Acute blood loss anemia   CKD stage 3a, GFR 45-59 ml/min (HCC)   Severe aortic stenosis   Acute on chronic diastolic CHF (congestive heart failure) (HCC)   Hemorrhagic shock (HCC)   History of CAD (coronary artery disease)   COVID-19 virus infection   Acute on chronic hypoxic respiratory failure (HCC)   Obesity (BMI 30-39.9)    Denyse Bathe, MD, FACC 08/18/2024 12:55 PM

## 2024-08-18 NOTE — Plan of Care (Signed)

## 2024-08-18 NOTE — Progress Notes (Signed)
 Progress Note   Patient: [name] FMW:[id number] DOB: [birth year] DOA: 08/12/2024     6 DOS: the patient was seen and examined on 08/18/2024   Brief hospital course: Partly taken from prior notes.  [name] is a 63 y.o. male PMH multiple medical comorbidities including COPD on 2 L nasal cannula, CAD with prior CABG and recent stent placement, HLD, stroke, dementia, diastolic CHF, prior GI bleed not on anticoagulation (was on dual antiplatelet therapy) presents for evaluation of reported syncopal episode, apparently patient has a bloody bowel movement.  Pt has a hx of AVM's undergone multiple bidrectional endoscopies without findings of bleeding within the last 2 yrs.   He was recently discharged from [facility] following a prolonged hospitalization 07/26-08/14 following treatment of ETOH withdrawal requiring mechanical intubation due to worsening acute respiratory failure secondary to pneumonia due to recurrent aspiration along with acute on chronic diastolic CHF and cardiac arrest.  He was discharged to [facility].   On presentation patient was hypotensive in 70s, labs with sodium of 133, BUN 33, creatinine 1.68, BNP 1514, troponin 888, lactic acid 3.2, leukocytosis at 12.2 and hemoglobin of 4.7. Chest x-ray concerning for pulmonary vascular congestion and small pleural effusion.  Patient was initially admitted in ICU for concern of hemorrhagic shock secondary to acute GI bleed.  GI was consulted. 2 unit PRBC ordered.  Home aspirin  and Plavix  was held.  8/27: Vital stable, did not require any pressors, hemoglobin improved to 8.3, WBC 15, sodium 134, BUN 28, creatinine 1.56, T. bili at 1.3, MRSA PCR positive, respiratory panel positive for COVID.  Patient received 2 unit of PRBC and 1 unit of platelets. Troponin peaked at 888.  Adding steroid and increasing the frequency of DuoNeb due to developing wheezing and shortness of breath.  Starting on cefepime  and vancomycin  while checking  procalcitonin due to worsening leukocytosis and shortness of breath.  Patient with significant underlying comorbidities and high risk for mortality, palliative care was also consulted.  8/28: Hemodynamically stable, he was placed on heated high flow overnight, now weaned back to baseline O2 of 2 L. No more bloody bowel movement but hemoglobin decreased to 8 today.  Improving creatinine.  Procalcitonin was negative so antibiotics were discontinued.  8/29: Hemodynamically stable, on 2 L of oxygen.  Hemoglobin with further decreased to 8 today.  Giving 1 unit of PRBC due to recent NSTEMI.  Restarting aspirin  and Plavix  as there is no active bleeding but he did not had any bowel movement since in the hospital-working on bowel regimen.  8/30.  Hemoglobin 9.4.  Patient complains of shortness of breath today.  Will give a dose of Lasix  and chest x-ray is negative except for nodular opacity right upper lung seen on prior CT scan. 8/31.  Patient was tapered off oxygen today.  Hemoglobin 9.8, creatinine 1.37 9/1.  Patient states he chronically wears 2 L of oxygen.  Continue to taper prednisone .  Hemoglobin 9.5.  Will give IV Venofer .  Assessment and Plan: * GI bleed Acute blood loss anemia. 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   Patient received 5 units of packed red blood cells - Aspirin  and Plavix  restarted on 8/29. - Today's hemoglobin 9.5 - Will give IV iron      Hemorrhagic shock (HCC) Initial concern of hemorrhagic shock with significant hypotension so he was admitted in ICU. Levophed  was ordered but never given. - Blood pressure now within goal -Continue to monitor -Supportive care  COVID-19 virus infection Prednisone  taper  Acute on chronic diastolic CHF (congestive heart failure) (HCC) Patient also has severe aortic stenosis.  Patient on metoprolol  and Cozaar .  COPD with acute exacerbation (HCC) Prednisone  taper  Elevated troponin No chest  pain, history of recent NSTEMI. Troponin peaked at 888, likely secondary to demand ischemia with GI bleed. - Cardiology was consulted -Continue to monitor  History of CAD (coronary artery disease) Patient with history of NSTEMI s/p PCI in July 2025 Troponin elevated, likely secondary to demand with GI bleed. Initially aspirin  and Plavix  was held but restarted.  Acute on chronic hypoxic respiratory failure (HCC) Chronically on 2 L  Severe aortic stenosis Patient has a scheduled outpatient appointment with Dr. Katina as he need evaluation for any possible procedure for severe aortic stenosis.  Obesity (BMI 30-39.9) Class I with a BMI of 30.35  CKD stage 3a, GFR 45-59 ml/min (HCC) Creatinine 1.32 with a GFR 58        Subjective: Patient feels better today than couple days ago with his breathing.  Admitted with COVID infection and COPD exacerbation and acute blood loss anemia.  Physical Exam: Vitals:   08/18/24 0407 08/18/24 0454 08/18/24 0845 08/18/24 1144  BP: 125/88  116/84 106/71  Pulse: 77  74 70  Resp: 20  20 18   Temp: 97.7 F (36.5 C)  97.6 F (36.4 C) 97.7 F (36.5 C)  TempSrc:      SpO2: 100%  97% 100%  Weight:  101.5 kg    Height:       Physical Exam HENT:     Head: Normocephalic.     Mouth/Throat:     Pharynx: No oropharyngeal exudate.  Eyes:     General: Lids are normal.     Conjunctiva/sclera: Conjunctivae normal.  Cardiovascular:     Rate and Rhythm: Normal rate and regular rhythm.     Heart sounds: Normal heart sounds, S1 normal and S2 normal.  Pulmonary:     Effort: No accessory muscle usage.     Breath sounds: Examination of the right-middle field reveals decreased breath sounds. Examination of the left-middle field reveals decreased breath sounds. Examination of the right-lower field reveals decreased breath sounds. Examination of the left-lower field reveals decreased breath sounds. Decreased breath sounds present. No wheezing, rhonchi or rales.   Abdominal:     Palpations: Abdomen is soft.     Tenderness: There is no abdominal tenderness.  Musculoskeletal:     Right lower leg: No swelling.     Left lower leg: No swelling.  Skin:    General: Skin is warm.     Findings: No rash.  Neurological:     Mental Status: He is alert and oriented to person, place, and time.     Data Reviewed: Hemoglobin 9.5   Disposition: Status is: Inpatient Remains inpatient appropriate because: Patient in COVID isolation.  Will need rehab  Planned Discharge Destination: Rehab    Time spent: 28 minutes  Author: Charlie Patterson, MD 08/18/2024 2:42 PM  For on call review [email or web address].

## 2024-08-18 NOTE — Plan of Care (Signed)
  Problem: Education: Goal: Knowledge of General Education information will improve Description: Including pain rating scale, medication(s)/side effects and non-pharmacologic comfort measures Outcome: Progressing   Problem: Health Behavior/Discharge Planning: Goal: Ability to manage health-related needs will improve Outcome: Progressing   Problem: Clinical Measurements: Goal: Ability to maintain clinical measurements within normal limits will improve Outcome: Progressing Goal: Will remain free from infection Outcome: Progressing Goal: Diagnostic test results will improve Outcome: Progressing Goal: Respiratory complications will improve Outcome: Progressing Goal: Cardiovascular complication will be avoided Outcome: Progressing   Problem: Activity: Goal: Risk for activity intolerance will decrease Outcome: Progressing   Problem: Nutrition: Goal: Adequate nutrition will be maintained Outcome: Progressing   Problem: Coping: Goal: Level of anxiety will decrease Outcome: Progressing   Problem: Elimination: Goal: Will not experience complications related to bowel motility Outcome: Progressing Goal: Will not experience complications related to urinary retention Outcome: Progressing   Problem: Pain Managment: Goal: General experience of comfort will improve and/or be controlled Outcome: Progressing   Problem: Safety: Goal: Ability to remain free from injury will improve Outcome: Progressing   Problem: Skin Integrity: Goal: Risk for impaired skin integrity will decrease Outcome: Progressing   Problem: Education: Goal: Knowledge of risk factors and measures for prevention of condition will improve Outcome: Progressing   Problem: Coping: Goal: Psychosocial and spiritual needs will be supported Outcome: Progressing   Problem: Respiratory: Goal: Will maintain a patent airway Outcome: Progressing Goal: Complications related to the disease process, condition or treatment  will be avoided or minimized Outcome: Progressing

## 2024-08-19 ENCOUNTER — Other Ambulatory Visit: Payer: Self-pay

## 2024-08-19 DIAGNOSIS — K922 Gastrointestinal hemorrhage, unspecified: Secondary | ICD-10-CM | POA: Diagnosis not present

## 2024-08-19 DIAGNOSIS — D62 Acute posthemorrhagic anemia: Secondary | ICD-10-CM | POA: Diagnosis not present

## 2024-08-19 DIAGNOSIS — R578 Other shock: Secondary | ICD-10-CM | POA: Diagnosis not present

## 2024-08-19 DIAGNOSIS — U071 COVID-19: Secondary | ICD-10-CM | POA: Diagnosis not present

## 2024-08-19 LAB — BASIC METABOLIC PANEL WITH GFR
Anion gap: 9 (ref 5–15)
BUN: 29 mg/dL — ABNORMAL HIGH (ref 8–23)
CO2: 30 mmol/L (ref 22–32)
Calcium: 8.5 mg/dL — ABNORMAL LOW (ref 8.9–10.3)
Chloride: 98 mmol/L (ref 98–111)
Creatinine, Ser: 1.42 mg/dL — ABNORMAL HIGH (ref 0.61–1.24)
GFR, Estimated: 55 mL/min — ABNORMAL LOW (ref >60)
Glucose, Bld: 154 mg/dL — ABNORMAL HIGH (ref 70–99)
Potassium: 4.4 mmol/L (ref 3.5–5.1)
Sodium: 137 mmol/L (ref 135–145)

## 2024-08-19 LAB — HEMOGLOBIN A1C
Hgb A1c MFr Bld: 5.3 % (ref 4.8–5.6)
Mean Plasma Glucose: 105 mg/dL

## 2024-08-19 LAB — GLUCOSE, CAPILLARY
Glucose-Capillary: 145 mg/dL — ABNORMAL HIGH (ref 70–99)
Glucose-Capillary: 173 mg/dL — ABNORMAL HIGH (ref 70–99)
Glucose-Capillary: 188 mg/dL — ABNORMAL HIGH (ref 70–99)
Glucose-Capillary: 226 mg/dL — ABNORMAL HIGH (ref 70–99)
Glucose-Capillary: 269 mg/dL — ABNORMAL HIGH (ref 70–99)

## 2024-08-19 LAB — HEMOGLOBIN: Hemoglobin: 9.7 g/dL — ABNORMAL LOW (ref 13.0–17.0)

## 2024-08-19 MED ORDER — ADULT MULTIVITAMIN W/MINERALS CH
1.0000 | ORAL_TABLET | Freq: Every day | ORAL | 0 refills | Status: AC
  Filled 2024-08-19 – 2024-08-22 (×2): qty 30, 30d supply, fill #0

## 2024-08-19 MED ORDER — INSULIN ASPART 100 UNIT/ML IJ SOLN
0.0000 [IU] | Freq: Three times a day (TID) | INTRAMUSCULAR | Status: DC
  Administered 2024-08-19: 5 [IU] via SUBCUTANEOUS
  Administered 2024-08-19 – 2024-08-20 (×2): 2 [IU] via SUBCUTANEOUS
  Administered 2024-08-20: 7 [IU] via SUBCUTANEOUS
  Administered 2024-08-20: 2 [IU] via SUBCUTANEOUS
  Administered 2024-08-21: 3 [IU] via SUBCUTANEOUS
  Administered 2024-08-22: 2 [IU] via SUBCUTANEOUS
  Administered 2024-08-22: 1 [IU] via SUBCUTANEOUS
  Filled 2024-08-19 (×8): qty 1

## 2024-08-19 MED ORDER — AMIODARONE HCL 200 MG PO TABS
200.0000 mg | ORAL_TABLET | Freq: Every day | ORAL | 0 refills | Status: DC
  Filled 2024-08-19: qty 30, 30d supply, fill #0

## 2024-08-19 MED ORDER — ORAL CARE MOUTH RINSE
15.0000 mL | OROMUCOSAL | Status: DC | PRN
Start: 2024-08-19 — End: 2024-08-22

## 2024-08-19 MED ORDER — ASCORBIC ACID 500 MG PO TABS
500.0000 mg | ORAL_TABLET | Freq: Every day | ORAL | 0 refills | Status: DC
  Filled 2024-08-19 – 2024-08-22 (×2): qty 30, 30d supply, fill #0

## 2024-08-19 MED ORDER — NICOTINE 7 MG/24HR TD PT24
MEDICATED_PATCH | TRANSDERMAL | 0 refills | Status: DC
  Filled 2024-08-19: qty 14, 14d supply, fill #0

## 2024-08-19 MED ORDER — PANTOPRAZOLE SODIUM 40 MG PO TBEC
40.0000 mg | DELAYED_RELEASE_TABLET | Freq: Every day | ORAL | 0 refills | Status: DC
  Filled 2024-08-19: qty 30, 30d supply, fill #0

## 2024-08-19 MED ORDER — FE FUM-VIT C-VIT B12-FA 460-60-0.01-1 MG PO CAPS
1.0000 | ORAL_CAPSULE | Freq: Two times a day (BID) | ORAL | 0 refills | Status: DC
  Filled 2024-08-19: qty 60, 30d supply, fill #0

## 2024-08-19 MED ORDER — GABAPENTIN 300 MG PO CAPS
300.0000 mg | ORAL_CAPSULE | Freq: Two times a day (BID) | ORAL | 0 refills | Status: DC
  Filled 2024-08-19: qty 60, 30d supply, fill #0

## 2024-08-19 MED ORDER — DAPAGLIFLOZIN PROPANEDIOL 10 MG PO TABS
10.0000 mg | ORAL_TABLET | Freq: Every day | ORAL | 0 refills | Status: DC
  Filled 2024-08-19 – 2024-08-22 (×2): qty 30, 30d supply, fill #0

## 2024-08-19 MED ORDER — DM-GUAIFENESIN ER 30-600 MG PO TB12
1.0000 | ORAL_TABLET | Freq: Two times a day (BID) | ORAL | 0 refills | Status: DC | PRN
Start: 2024-08-19 — End: 2024-10-30
  Filled 2024-08-19 – 2024-08-22 (×2): qty 20, 10d supply, fill #0

## 2024-08-19 MED ORDER — PREDNISONE 10 MG PO TABS
ORAL_TABLET | ORAL | 0 refills | Status: DC
  Filled 2024-08-19: qty 7, 6d supply, fill #0

## 2024-08-19 MED ORDER — VITAMIN D (ERGOCALCIFEROL) 1.25 MG (50000 UNIT) PO CAPS
50000.0000 [IU] | ORAL_CAPSULE | ORAL | 0 refills | Status: AC
  Filled 2024-08-19 – 2024-08-22 (×2): qty 4, 28d supply, fill #0

## 2024-08-19 MED ORDER — MELATONIN 5 MG PO TABS
5.0000 mg | ORAL_TABLET | Freq: Every evening | ORAL | 0 refills | Status: AC | PRN
  Filled 2024-08-19 – 2024-08-22 (×2): qty 30, 30d supply, fill #0

## 2024-08-19 MED ORDER — INSULIN ASPART 100 UNIT/ML IJ SOLN
0.0000 [IU] | Freq: Every day | INTRAMUSCULAR | Status: DC
  Administered 2024-08-21: 3 [IU] via SUBCUTANEOUS
  Filled 2024-08-19: qty 1

## 2024-08-19 MED ORDER — ALBUTEROL SULFATE HFA 108 (90 BASE) MCG/ACT IN AERS
2.0000 | INHALATION_SPRAY | Freq: Four times a day (QID) | RESPIRATORY_TRACT | 0 refills | Status: AC | PRN
  Filled 2024-08-19: qty 6.7, 25d supply, fill #0
  Filled 2024-08-22: qty 13.4, 60d supply, fill #0

## 2024-08-19 MED ORDER — ROSUVASTATIN CALCIUM 20 MG PO TABS
20.0000 mg | ORAL_TABLET | Freq: Every day | ORAL | 0 refills | Status: DC
  Filled 2024-08-19 – 2024-08-22 (×2): qty 30, 30d supply, fill #0

## 2024-08-19 MED ORDER — ISOSORBIDE MONONITRATE ER 30 MG PO TB24
30.0000 mg | ORAL_TABLET | Freq: Every day | ORAL | Status: DC
  Administered 2024-08-19 – 2024-08-20 (×2): 30 mg via ORAL
  Filled 2024-08-19 (×3): qty 1

## 2024-08-19 MED ORDER — LACTULOSE 10 GM/15ML PO SOLN
20.0000 g | Freq: Every day | ORAL | 0 refills | Status: DC | PRN
Start: 2024-08-19 — End: 2024-10-30
  Filled 2024-08-19 – 2024-08-22 (×2): qty 946, 31d supply, fill #0

## 2024-08-19 MED ORDER — POLYETHYLENE GLYCOL 3350 17 GM/SCOOP PO POWD
17.0000 g | Freq: Every day | ORAL | 0 refills | Status: DC
  Filled 2024-08-19: qty 510, 30d supply, fill #0

## 2024-08-19 MED ORDER — MUPIROCIN 2 % EX OINT
TOPICAL_OINTMENT | Freq: Two times a day (BID) | CUTANEOUS | 0 refills | Status: DC
  Filled 2024-08-19: qty 22, 30d supply, fill #0

## 2024-08-19 MED ORDER — VITAMIN B-1 100 MG PO TABS
100.0000 mg | ORAL_TABLET | Freq: Every day | ORAL | 0 refills | Status: AC
  Filled 2024-08-19 – 2024-08-22 (×2): qty 30, 30d supply, fill #0

## 2024-08-19 MED ORDER — METOPROLOL SUCCINATE ER 25 MG PO TB24
25.0000 mg | ORAL_TABLET | Freq: Every day | ORAL | 0 refills | Status: DC
  Filled 2024-08-19: qty 30, 30d supply, fill #0

## 2024-08-19 MED ORDER — CLOPIDOGREL BISULFATE 75 MG PO TABS
75.0000 mg | ORAL_TABLET | Freq: Every day | ORAL | 0 refills | Status: AC
Start: 2024-08-19 — End: ?
  Filled 2024-08-19 – 2024-08-22 (×2): qty 30, 30d supply, fill #0

## 2024-08-19 MED ORDER — TRELEGY ELLIPTA 200-62.5-25 MCG/ACT IN AEPB
1.0000 | INHALATION_SPRAY | Freq: Every day | RESPIRATORY_TRACT | 0 refills | Status: DC
  Filled 2024-08-19: qty 60, 30d supply, fill #0

## 2024-08-19 MED ORDER — LOSARTAN POTASSIUM 25 MG PO TABS
25.0000 mg | ORAL_TABLET | Freq: Every day | ORAL | 0 refills | Status: DC
  Filled 2024-08-19: qty 30, 30d supply, fill #0

## 2024-08-19 MED ORDER — ISOSORBIDE MONONITRATE ER 30 MG PO TB24
30.0000 mg | ORAL_TABLET | Freq: Every day | ORAL | 0 refills | Status: DC
Start: 2024-08-19 — End: 2024-08-22
  Filled 2024-08-19: qty 30, 30d supply, fill #0

## 2024-08-19 MED ORDER — ASPIRIN 81 MG PO TBEC
81.0000 mg | DELAYED_RELEASE_TABLET | Freq: Every day | ORAL | 0 refills | Status: DC
  Filled 2024-08-19: qty 30, 30d supply, fill #0

## 2024-08-19 NOTE — Progress Notes (Signed)
 Occupational Therapy Treatment Patient Details Name: [name] MRN: [id number] DOB: 1961-09-11 Today's Date: 08/19/2024   History of present illness 63 y.o. male with medical history significant of  GIB, alcohol  abuse, COPD on 2 L oxygen, hypertension, CAD with prior CABG and recent Stent placement, HLD, stroke, dementia, dCHF, presents with SOB, cough, chest pain.     Patient was recently hospitalized from 7/25 - 8/14 and d/c'd to rehab facility.  Returns after syncopal epsisode with weakness and GI bleed - recieved blood, found to be Covid +   OT comments  Chart reviewed to date, pt greeted sitting on edge of bed, agreeable to OT tx session targeting improving functional activity tolerance in prep for ADL tasks. Pt continues to present with limited activity tolerance, requiring increased time for recovery (approx 3 minutes) but Spo2 >90% throughout on RA. Discharge recommendation remains appropriate. OT will continue to follow.       If plan is discharge home, recommend the following:  A lot of help with bathing/dressing/bathroom;Assistance with cooking/housework;Direct supervision/assist for medications management;Assist for transportation;A little help with walking and/or transfers   Equipment Recommendations  BSC/3in1    Recommendations for Other Services      Precautions / Restrictions Precautions Precautions: Fall Recall of Precautions/Restrictions: Intact Restrictions Weight Bearing Restrictions Per Provider Order: No       Mobility Bed Mobility               General bed mobility comments: NT sitting on edge of bed pre/post session    Transfers Overall transfer level: Needs assistance Equipment used: Rolling walker (2 wheels) Transfers: Sit to/from Stand Sit to Stand: Supervision                 Balance Overall balance assessment: Needs assistance Sitting-balance support: Feet supported Sitting balance-Leahy Scale: Good     Standing balance  support: During functional activity, Bilateral upper extremity supported Standing balance-Leahy Scale: Fair                             ADL either performed or assessed with clinical judgement   ADL Overall ADL's : Needs assistance/impaired Eating/Feeding: Set up;Sitting                   Lower Body Dressing: Sitting/lateral leans;Maximal assistance Lower Body Dressing Details (indicate cue type and reason): extremely SOB despite energy conservation techniques Toilet Transfer: Supervision/safety;Contact guard assist;Rolling walker (2 wheels);Ambulation Toilet Transfer Details (indicate cue type and reason): simulated         Functional mobility during ADLs: Supervision/safety;Contact guard assist;Rolling walker (2 wheels) (approx 80' with RW, requires approx 3 min rest break due to SOB)      Extremity/Trunk Assessment              Vision       Perception     Praxis     Communication Communication Communication: Impaired Factors Affecting Communication: Hearing impaired   Cognition Arousal: Alert Behavior During Therapy: WFL for tasks assessed/performed Cognition: Cognition impaired           Executive functioning impairment (select all impairments): Problem solving                   Following commands: Intact Following commands impaired: Follows multi-step commands with increased time      Cueing   Cueing Techniques: Verbal cues  Exercises Other Exercises Other Exercises: edu re role of OT, role  of rehab, discharge recommendations    Shoulder Instructions       General Comments spo2 >90% on 2 L via [place] throughout    Pertinent Vitals/ Pain       Pain Assessment Pain Assessment: No/denies pain  Home Living                                          Prior Functioning/Environment              Frequency  Min 2X/week        Progress Toward Goals  OT Goals(current goals can now be found in the  care plan section)  Progress towards OT goals: Progressing toward goals  Acute Rehab OT Goals Time For Goal Achievement: 08/28/24  Plan      Co-evaluation                 AM-PAC OT 6 Clicks Daily Activity     Outcome Measure   Help from another person eating meals?: None Help from another person taking care of personal grooming?: None Help from another person toileting, which includes using toliet, bedpan, or urinal?: A Little Help from another person bathing (including washing, rinsing, drying)?: A Little Help from another person to put on and taking off regular upper body clothing?: None Help from another person to put on and taking off regular lower body clothing?: A Lot 6 Click Score: 20    End of Session Equipment Utilized During Treatment: Rolling walker (2 wheels);Oxygen  OT Visit Diagnosis: Muscle weakness (generalized) (M62.81);History of falling (Z91.81)   Activity Tolerance Patient tolerated treatment well   Patient Left Other (comment);with call bell/phone within reach;with bed alarm set (sitting on edge of bed)   Nurse Communication Mobility status        Time: 8886-8865 OT Time Calculation (min): 21 min  Charges: OT General Charges $OT Visit: 1 Visit OT Treatments $Therapeutic Activity: 8-22 mins  Therisa Sheffield, OTD OTR/L  08/19/24, 12:07 PM

## 2024-08-19 NOTE — Progress Notes (Signed)
 [facility] CLINIC CARDIOLOGY PROGRESS NOTE       Patient ID: [name] MRN: [id number] DOB/AGE: 1961/03/06 63 y.o.  Admit date: 08/12/2024 Referring Physician Dr. Caleen Primary Physician Osa Geralds, NP Primary Cardiologist Tinnie Maiden, NP/ Dr. Katina Reason for Consultation elevated trop, severe AS, complicated cardiac hx  HPI: [name] is a 63 y.o. 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 Of note, patient recently hospitalized 07/26 - 08/14 for ETOH withdrawal requiring mechanical intubation with PNA due to recurrent aspiration with AoC CHF. Patient was discharged to [facility]. During this admission, Hgb found to be 4.7. Troponin found to be elevated, patient denies any chest pain.  Cardiology was consulted for further evaluation.   Interval History: -Patient seen and examined this AM and laying comfortably in hospital bed, nearly flat. Patient states he he feels great today, states he feels like he is back to normal.  Denies any SOB.  Continues to deny any chest pain/pressure, palpitations or lightheadedness. -Patients BP and HR stable this AM. Overnight Tele showed no significant events.  -Patient remains on 2L [place] (at baseline).  -Hemoglobin this a.m.  Pertinent Cardiac History (Most recent) LHC 07/02/24   Prox LAD to Mid LAD lesion is 90% stenosed.   Ost LM to Mid LM lesion is 50% stenosed.   Ost Cx to Prox Cx lesion is 90% stenosed.   Ost RCA to Prox RCA lesion is 100% stenosed.   Mid Graft to Dist Graft lesion is 95% stenosed.   Recommend  uninterrupted dual antiplatelet therapy with Aspirin  81mg  daily and Clopidogrel  75mg  daily for a minimum of 6 months (stable ischemic heart disease-Class I recommendation).   1.  Severe native three-vessel CAD 2.  LIMA to LAD and SVG to OM widely patent 3.  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  Successful direct stenting of the distal SVG to PDA with distal protection with a 4.0 x 15 mm drug-eluting stent with intravascular ultrasound guidance 5.  Aspirin  and clopidogrel  for at least 6 months followed by clopidogrel  indefinitely 6.  Continue workup for aortic valve replacement    Review of systems complete and found to be negative unless listed above    Past Medical History:  Diagnosis Date   COPD (chronic obstructive pulmonary disease) (HCC)    CVA (cerebral infarction)    Headache    mild, since stroke 2012   Hypertension    MI (myocardial infarction) (HCC) 05/09/2012   Reading difficulty    pt reports he reads at about a second grade level   Stroke [facility]) 2012   numbness to left hand    Wears dentures    full upper and lower    Past Surgical History:  Procedure Laterality Date   CAROTID ENDARTERECTOMY Left    2012 or 2013   CHOLECYSTECTOMY N/A 07/26/2016   Procedure: LAPAROSCOPIC CHOLECYSTECTOMY;  Surgeon: Charlie FORBES Fell, MD;  Location: [facility] ORS;  Service: General;  Laterality: N/A;   COLONOSCOPY N/A 05/08/2024   Procedure: COLONOSCOPY;  Surgeon: Jinny Carmine, MD;  Location: [facility]  ENDOSCOPY;  Service: Endoscopy;  Laterality: N/A;   COLONOSCOPY WITH PROPOFOL  N/A 02/09/2022   Procedure: COLONOSCOPY WITH PROPOFOL ;  Surgeon: Jinny Carmine, MD;  Location: [facility] ENDOSCOPY;  Service: Endoscopy;  Laterality: N/A;   CORONARY ARTERY BYPASS GRAFT  05/10/2012   3 vessel   CORONARY STENT INTERVENTION N/A 07/02/2024   Procedure: CORONARY STENT INTERVENTION;  Surgeon: Katina Albright, MD;  Location: [facility] INVASIVE CV LAB;  Service: Cardiovascular;  Laterality: N/A;   CORONARY  ULTRASOUND/IVUS N/A 07/02/2024   Procedure: Coronary Ultrasound/IVUS;  Surgeon: Katina Albright, MD;  Location: [facility] INVASIVE CV LAB;  Service: Cardiovascular;  Laterality: N/A;   ENTEROSCOPY N/A 05/16/2023   Procedure: ENTEROSCOPY;  Surgeon: Therisa Bi, MD;  Location: [facility] ENDOSCOPY;  Service: Gastroenterology;  Laterality: N/A;   ENTEROSCOPY N/A 06/01/2023   Procedure: ENTEROSCOPY;  Surgeon: Unk Corinn Skiff, MD;  Location: [facility] ENDOSCOPY;  Service: Gastroenterology;  Laterality: N/A;   ESOPHAGOGASTRODUODENOSCOPY  05/13/2023   Procedure: ESOPHAGOGASTRODUODENOSCOPY (EGD);  Surgeon: Jinny Carmine, MD;  Location: [facility] ENDOSCOPY;  Service: Endoscopy;;   ESOPHAGOGASTRODUODENOSCOPY (EGD) WITH PROPOFOL  N/A 10/28/2022   Procedure: ESOPHAGOGASTRODUODENOSCOPY (EGD) WITH PROPOFOL ;  Surgeon: Onita Elspeth Sharper, DO;  Location: [facility] ENDOSCOPY;  Service: Gastroenterology;  Laterality: N/A;   GIVENS CAPSULE STUDY  05/13/2023   Procedure: GIVENS CAPSULE STUDY;  Surgeon: Jinny Carmine, MD;  Location: [facility] ENDOSCOPY;  Service: Endoscopy;;   GIVENS CAPSULE STUDY  06/01/2023   Procedure: GIVENS CAPSULE STUDY;  Surgeon: Unk Corinn Skiff, MD;  Location: [facility] ENDOSCOPY;  Service: Gastroenterology;;   LEFT HEART CATH AND CORS/GRAFTS ANGIOGRAPHY N/A 07/02/2024   Procedure: LEFT HEART CATH AND CORS/GRAFTS ANGIOGRAPHY;  Surgeon: Katina Albright, MD;  Location: [facility] INVASIVE CV LAB;  Service: Cardiovascular;  Laterality: N/A;    Medications Prior to Admission  Medication Sig Dispense Refill Last Dose/Taking   budesonide  (PULMICORT ) 0.5 MG/2ML nebulizer solution Take 2 mLs (0.5 mg total) by nebulization 2 (two) times daily.   08/12/2024 at  8:13 AM   folic acid  (FOLVITE ) 1 MG tablet Take 1 tablet (1 mg total) by mouth daily. 30 tablet 2 08/12/2024 at  8:13 AM   Glycerin , Laxative, (GLYCERIN , ADULT,) 2 g SUPP Place 1 suppository rectally daily as needed for moderate constipation.   Unknown   HYDROcodone -acetaminophen   (NORCO/VICODIN) 5-325 MG tablet Take 1 tablet by mouth every 6 (six) hours as needed for moderate pain (pain score 4-6).   Unknown   ipratropium-albuterol  (DUONEB) 0.5-2.5 (3) MG/3ML SOLN Take 3 mLs by nebulization 3 (three) times daily.   08/12/2024 at 11:44 AM   iron  polysaccharides (NIFEREX) 150 MG capsule Take 1 capsule (150 mg total) by mouth daily. 30 capsule 2 08/12/2024 at  8:13 AM   lidocaine  4 % Place 1 patch onto the skin daily.   08/12/2024 at  8:13 AM   senna-docusate (SENOKOT-S) 8.6-50 MG tablet Take 2 tablets by mouth at bedtime.   08/11/2024 at  9:22 AM   sodium chloride  (OCEAN) 0.65 % SOLN nasal spray Place 1 spray into both nostrils as needed for congestion.   Unknown   torsemide  (DEMADEX ) 20 MG tablet Take 1 tablet (20 mg total) by mouth daily.   08/12/2024 at  8:13 AM   traZODone  (DESYREL ) 50 MG tablet Take 50 mg by mouth at bedtime.   08/11/2024 at  9:22 PM   [DISCONTINUED] albuterol  (VENTOLIN  HFA) 108 (90 Base) MCG/ACT inhaler Inhale 2 puffs into the lungs every 6 (six) hours as needed for wheezing or shortness of breath. 6.7 g 2 08/12/2024 at  12:56 AM   [DISCONTINUED] amiodarone  (PACERONE ) 200 MG tablet Take 1 tablet (200 mg total) by mouth daily.   08/12/2024 at  8:13 AM   [DISCONTINUED] ascorbic acid  (VITAMIN C ) 500 MG tablet Take 1 tablet (500 mg total) by mouth daily. 30 tablet 2 08/12/2024 at  8:13 AM   [DISCONTINUED] aspirin  EC 81 MG tablet Take 1 tablet (81 mg total) by mouth daily. Swallow whole. 30 tablet 5 08/12/2024 at  8:13 AM   [DISCONTINUED] clopidogrel  (PLAVIX ) 75 MG tablet Take 1 tablet (75 mg total) by mouth daily with breakfast. 30 tablet 11 08/12/2024 at  5:35 AM   [DISCONTINUED] dapagliflozin  propanediol (FARXIGA ) 10 MG TABS tablet Take 1 tablet (10 mg total) by mouth daily. 30 tablet 11 08/12/2024 at  8:13 AM   [DISCONTINUED] dextromethorphan -guaiFENesin  (MUCINEX  DM) 30-600 MG 12hr tablet Take 1 tablet by mouth 2 (two) times daily as needed for cough.   Unknown    [DISCONTINUED] lactulose  (CHRONULAC ) 10 GM/15ML solution Take 30 mLs (20 g total) by mouth daily as needed for moderate constipation or mild constipation.   08/10/2024   [DISCONTINUED] melatonin 5 MG TABS Place 1 tablet (5 mg total) into feeding tube at bedtime as needed.   Unknown   [DISCONTINUED] Multiple Vitamin (MULTIVITAMIN WITH MINERALS) TABS tablet Take 1 tablet by mouth daily.   08/12/2024 at  8:13 AM   [DISCONTINUED] nicotine  (NICODERM CQ  - DOSED IN MG/24 HR) 7 mg/24hr patch Place 1 patch (7 mg total) onto the skin daily.   08/12/2024 at  8:13 AM   [DISCONTINUED] polyethylene glycol (MIRALAX  / GLYCOLAX ) 17 g packet Take 17 g by mouth at bedtime.   08/10/2024   [DISCONTINUED] rosuvastatin  (CRESTOR ) 20 MG tablet Take 1 tablet (20 mg total) by mouth daily.   08/11/2024 at  9:22 PM   [DISCONTINUED] thiamine  (VITAMIN B-1) 100 MG tablet Take 1 tablet (100 mg total) by mouth daily.   08/12/2024 at  8:13 AM   [DISCONTINUED] Vitamin D , Ergocalciferol , (DRISDOL ) 1.25 MG (50000 UNIT) CAPS capsule Take 1 capsule (50,000 Units total) by mouth every 7 (seven) days.   08/09/2024   feeding supplement (ENSURE PLUS HIGH PROTEIN) LIQD Take 237 mLs by mouth 3 (three) times daily between meals.      Social History   Socioeconomic History   Marital status: Divorced    Spouse name: Not on file   Number of children: Not on file   Years of education: Not on file   Highest education level: Not on file  Occupational History   Not on file  Tobacco Use   Smoking status: Former    Current packs/day: 0.00    Types: Cigarettes    Quit date: 84    Years since quitting: 32.6   Smokeless tobacco: Current    Types: Snuff   Tobacco comments:    changed to dip  Vaping Use   Vaping status: Never Used  Substance and Sexual Activity   Alcohol  use: Yes    Alcohol /week: 26.0 standard drinks of alcohol     Types: 26 Cans of beer per week   Drug use: No   Sexual activity: Yes    Birth control/protection: None  Other  Topics Concern   Not on file  Social History Narrative   Not on file   Social Drivers of Health   Financial Resource Strain: Not on file  Food Insecurity: No Food Insecurity (08/13/2024)   Hunger Vital Sign    Worried About Running Out of Food  in the Last Year: Never true    Ran Out of Food in the Last Year: Never true  Transportation Needs: No Transportation Needs (08/13/2024)   PRAPARE - Administrator, Civil Service (Medical): No    Lack of Transportation (Non-Medical): No  Physical Activity: Not on file  Stress: Not on file  Social Connections: Socially Isolated (08/13/2024)   Social Connection and Isolation Panel    Frequency of Communication with Friends and Family: More than three times a week    Frequency of Social Gatherings with Friends and Family: More than three times a week    Attends Religious Services: Never    Database administrator or Organizations: No    Attends Banker Meetings: Never    Marital Status: Divorced  Catering manager Violence: Not At Risk (08/13/2024)   Humiliation, Afraid, Rape, and Kick questionnaire    Fear of Current or Ex-Partner: No    Emotionally Abused: No    Physically Abused: No    Sexually Abused: No    Family History  Problem Relation Age of Onset   Pneumonia Mother      Vitals:   08/18/24 2001 08/18/24 2352 08/19/24 0521 08/19/24 0748  BP: 123/77 116/70  (!) 157/85  Pulse: 75 68  81  Resp: 17 18  20   Temp: 97.7 F (36.5 C) 98.4 F (36.9 C)  (!) 97.5 F (36.4 C)  TempSrc:  Oral  Oral  SpO2: 99% 100%  100%  Weight:   103.5 kg   Height:        PHYSICAL EXAM General: Chronically ill-appearing elderly male, well nourished, in no acute distress. HEENT: Normocephalic and atraumatic. Neck: No JVD.   Lungs: Normal respiratory effort on 2L [place]. Clear bilaterally to auscultation, diminished bilaterally. Heart: HRRR. Normal S1 and S2, + systolic murmur.  Abdomen: Non-distended appearing.  Msk: Normal  strength and tone for age. Extremities: Warm and well perfused. No clubbing, cyanosis, edema.  Neuro: Alert and oriented X 3. Psych: Answers questions appropriately.   Labs: Basic Metabolic Panel: Recent Labs    08/17/24 0658 08/19/24 0555  NA 135 137  K 4.6 4.4  CL 97* 98  CO2 28 30  GLUCOSE 173* 154*  BUN 30* 29*  CREATININE 1.37* 1.42*  CALCIUM  8.4* 8.5*  PHOS 2.9  --    Liver Function Tests: No results for input(s): AST, ALT, ALKPHOS, BILITOT, PROT, ALBUMIN  in the last 72 hours.  No results for input(s): LIPASE, AMYLASE in the last 72 hours.  CBC: Recent Labs    08/17/24 0658 08/18/24 0511 08/19/24 0555  WBC 9.6  --   --   HGB 9.8* 9.5* 9.7*  HCT 31.9*  --   --   MCV 92.5  --   --   PLT 219  --   --    Cardiac Enzymes: No results for input(s): CKTOTAL, CKMB, CKMBINDEX, TROPONINIHS in the last 72 hours.  BNP: No results for input(s): BNP in the last 72 hours.  D-Dimer: No results for input(s): DDIMER in the last 72 hours. Hemoglobin A1C: No results for input(s): HGBA1C in the last 72 hours. Fasting Lipid Panel: No results for input(s): CHOL, HDL, LDLCALC, TRIG, CHOLHDL, LDLDIRECT in the last 72 hours. Thyroid Function Tests: No results for input(s): TSH, T4TOTAL, T3FREE, THYROIDAB in the last 72 hours.  Invalid input(s): FREET3 Anemia Panel: No results for input(s): VITAMINB12, FOLATE, FERRITIN, TIBC, IRON , RETICCTPCT in the last 72 hours.   Radiology:  DG Chest Port 1 View Result Date: 08/16/2024 EXAM: 1 VIEW(S) XRAY OF THE CHEST 08/16/2024 10:45:00 AM COMPARISON: CTA chest dated 07/11/2024 and chest X-ray dated 08/13/2024. CLINICAL HISTORY: Shortness of breath FINDINGS: LUNGS AND PLEURA: No focal consolidation. No pleural effusion. No pneumothorax. A nodular opacity is present in the right upper lung laterally, which is grossly similar to the CTA chest dated 07/11/2024. HEART AND MEDIASTINUM:  Stable cardiomediastinal silhouette with evidence of sternotomy and CABG. BONES AND SOFT TISSUES: No acute osseous abnormality. IMPRESSION: 1. No acute process. 2. Stable nodular opacity in the right upper lung laterally, grossly similar to CTA chest 07/11/2024. Given persistence, follow-up CT in 3-6 months is recommended. Electronically signed by: Norman Gatlin MD 08/16/2024 11:02 AM EDT RP Workstation: [id number]   DG Chest Port 1 View Result Date: 08/13/2024 CLINICAL DATA:  Shortness of breath.  COVID positive. EXAM: PORTABLE CHEST 1 VIEW COMPARISON:  August 12, 2024. FINDINGS: Stable cardiomediastinal silhouette. Status post coronary artery bypass graft. Left lung is clear. Minimal right pleural effusion is noted with associated minimal associated atelectasis. Bony thorax is unremarkable. IMPRESSION: Minimal right pleural effusion is noted with associated minimal right basilar atelectasis. Electronically Signed   By: Lynwood Landy Raddle M.D.   On: 08/13/2024 17:20   DG Chest Portable 1 View Result Date: 08/12/2024 CLINICAL DATA:  Syncopal episode. Septic shock. Clinical concern for pneumonia. EXAM: PORTABLE CHEST 1 VIEW COMPARISON:  07/29/2024. Abdomen and pelvis CTA dated 08/12/2024. Chest CTA dated 07/11/2024. FINDINGS: Interval enlarged cardiac silhouette. Stable post CABG changes. The lungs are hyperexpanded with mildly prominent upper lung zone pulmonary vasculature. Small right pleural effusion. Biapical bullous changes. Unremarkable bones. IMPRESSION: 1. Interval cardiomegaly and mild pulmonary vascular congestion. 2. Small right pleural effusion. 3. COPD with biapical bullous changes. Electronically Signed   By: Elspeth Bathe M.D.   On: 08/12/2024 16:58   CT ANGIO GI BLEED Result Date: 08/12/2024 CLINICAL DATA:  Anemia, syncope and melena. EXAM: CTA ABDOMEN AND PELVIS WITHOUT AND WITH CONTRAST TECHNIQUE: Multidetector CT imaging of the abdomen and pelvis was performed using the standard protocol  during bolus administration of intravenous contrast. Multiplanar reconstructed images and MIPs were obtained and reviewed to evaluate the vascular anatomy. RADIATION DOSE REDUCTION: This exam was performed according to the departmental dose-optimization program which includes automated exposure control, adjustment of the mA and/or kV according to patient size and/or use of iterative reconstruction technique. CONTRAST:  OMNIPAQUE  IOHEXOL  350 MG/ML SOLN COMPARISON:  CT abdomen and pelvis 03/24/2016 FINDINGS: VASCULAR Aorta: Atherosclerosis of the abdominal aorta without aneurysm. Ulcerated plaque of the anterior infrarenal segment. Celiac: Calcified plaque causes approximately 50-60% origin stenosis. Distal branches are normally patent. SMA: Atherosclerosis of SMA trunk without significant stenosis. Renals: Heavily calcified plaque in proximal segments bilateral single renal arteries causes moderate stenoses of approximately 50-70%. The left renal artery almost immediately bifurcates beyond its origin. IMA: Patent small caliber IMA. Inflow: Heavily calcified bilateral iliac arteries with diffuse disease and no aneurysmal disease or obstruction. Proximal Outflow: Calcified common femoral arteries. Probable significant stenosis at the origin of the right SFA. Veins: Venous phase imaging demonstrates patent venous structures in the abdomen and pelvis without thrombus or other abnormalities. Review of the MIP images confirms the above findings. NON-VASCULAR Lower chest: No acute abnormality. Hepatobiliary: Potential early cirrhotic morphology of the liver. No hepatic masses identified. Status post cholecystectomy. No biliary dilatation. Pancreas: Unremarkable. No pancreatic ductal dilatation or surrounding inflammatory changes. Spleen: Normal in size without focal abnormality. Adrenals/Urinary Tract:  Adrenal glands are unremarkable. Kidneys are normal, without renal calculi, focal lesion, or hydronephrosis. Bladder  is unremarkable. Stomach/Bowel: Speckled high density material in the stomach is present on imaging prior to contrast administration. There is no evidence active bleeding on arterial or venous phases of imaging within the gastrointestinal tract. Bowel shows no evidence of obstruction, ileus, inflammation or lesion. The appendix is not discretely visualized. No free intraperitoneal air. Lymphatic: No enlarged abdominal or pelvic lymph nodes. Reproductive: Prostate is unremarkable. Other: No abdominal wall hernia or abnormality. No abdominopelvic ascites. Musculoskeletal: Mild loss of height of the T12 vertebral body. IMPRESSION: 1. No evidence of active bleeding within the gastrointestinal tract. 2. Atherosclerosis of the abdominal aorta and iliac arteries without aneurysm. Ulcerated plaque of the anterior infrarenal aorta. 3. 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 4. Moderate stenoses of approximately 50-70% in proximal bilateral renal arteries. 5. Probable significant stenosis at the origin of the right SFA. 6. Potential early cirrhotic morphology of the liver. 7. Mild loss of height of the T12 vertebral body. Aortic Atherosclerosis (ICD10-I70.0). Electronically Signed   By: Marcey Moan M.D.   On: 08/12/2024 16:21   DG Chest Port 1 View Result Date: 07/29/2024 CLINICAL DATA:  Congestive heart failure. EXAM: PORTABLE CHEST 1 VIEW COMPARISON:  07/24/2024 FINDINGS: The enteric tube has been removed. Post median sternotomy with stable cardiomegaly. Bilateral pleural effusions and bibasilar opacities, slightly worsened in the interim. Improved interstitial opacities with mild residual. Underlying emphysematous change. No pneumothorax. IMPRESSION: 1. Bilateral pleural effusions and bibasilar opacities, slightly worsened in the interim. 2. Improved interstitial opacities with mild residual. Electronically Signed   By: Andrea Gasman M.D.   On: 07/29/2024 09:53   DG Chest 1 View Result Date:  07/24/2024 CLINICAL DATA:  [id number] Dyspnea [id number] EXAM: CHEST  1 VIEW COMPARISON:  July 23, 2024, July 11, 2024 FINDINGS: Esophagogastric tube terminates below the diaphragm, outside the field of view. Diffuse interstitial opacities throughout both lungs. No lobar consolidation or pneumothorax. Lateral right upper lobe nodular opacity measures 1.9 cm, corresponding to the lung nodule on the prior chest CT. Moderate cardiomegaly. Sternotomy wires and CABG markers. No acute fracture or destructive lesions. Multilevel thoracic osteophytosis. IMPRESSION: 1. Moderate cardiomegaly. Unchanged interstitial opacities throughout both lungs, possibly persistent interstitial edema and underlying emphysematous changes. 2. Similarly appearing lateral right upper lung zone nodular opacity. Please see the comparison chest CT for further characterization. Electronically Signed   By: Rogelia Myers M.D.   On: 07/24/2024 08:43   DG Chest Port 1 View Result Date: 07/23/2024 CLINICAL DATA:  Shortness of breath. EXAM: PORTABLE CHEST 1 VIEW COMPARISON:  July 22, 2024. FINDINGS: The heart size and mediastinal contours are within normal limits. Status post coronary bypass graft. Mild diffuse interstitial densities are noted concerning for possible pulmonary edema with possible small pleural effusions. Nasogastric tube is seen entering stomach. The visualized skeletal structures are unremarkable. IMPRESSION: Mild diffuse interstitial densities are noted concerning for possible pulmonary edema with small pleural effusions. Electronically Signed   By: Lynwood Landy Raddle M.D.   On: 07/23/2024 13:03   DG ABD ACUTE 2+V W 1V CHEST Result Date: 07/22/2024 CLINICAL DATA:  [id number] Dyspnea [id number] [phone] Encounter for imaging study to confirm nasogastric (NG) tube placement [id number] EXAM: DG ABDOMEN ACUTE WITH 1 VIEW CHEST COMPARISON:  Chest x-ray 07/19/2024 FINDINGS: Enteric tube courses below the diaphragm with tip and side port overlying the expected  region of the gastric lumen. The heart and mediastinal contours are  within normal limits. No focal consolidation. Chronic coarsened interstitial markings with no overt pulmonary edema. Bilateral trace pleural effusion. No pneumothorax. Right upper quadrant surgical clips. There is no evidence of dilated bowel loops or free intraperitoneal air. No radiopaque calculi or other significant radiographic abnormality is seen. No acute osseous abnormality.  Intact sternotomy wires. IMPRESSION: 1. Enteric tube in good position. 2. Bowel trace pleural effusions. 3. Nonobstructive bowel gas pattern. Electronically Signed   By: Morgane  Naveau M.D.   On: 07/22/2024 14:18    ECHO 05/10/2024  1. Left ventricular ejection fraction, by estimation, is 60 to 65%. The left ventricle has normal function. The left ventricle has no regional wall motion abnormalities. The left ventricular internal cavity size was mildly dilated. Left ventricular diastolic parameters are consistent with Grade I diastolic dysfunction (impaired relaxation).   2. Right ventricular systolic function is normal. The right ventricular size is normal.   3. The mitral valve is normal in structure. No evidence of mitral valve regurgitation.   4. The aortic valve is normal in structure. Aortic valve regurgitation is trivial. Severe aortic valve stenosis.   TELEMETRY reviewed by me 08/19/2024: Sinus rhythm, rate 80s   EKG reviewed by me: sinus rhythm with borderline ST depression (similar to prior EKGs), rate 93 bpm  Data reviewed by me 08/19/2024: last 24h vitals tele labs imaging I/O hospitalist progress notes.  Principal Problem:   GI bleed Active Problems:   COPD with acute exacerbation (HCC)   Elevated troponin   Acute blood loss anemia   CKD stage 3a, GFR 45-59 ml/min (HCC)   Severe aortic stenosis   Acute on chronic diastolic CHF (congestive heart failure) (HCC)   Hemorrhagic shock (HCC)   History of CAD (coronary artery disease)    COVID-19 virus infection   Acute on chronic hypoxic respiratory failure (HCC)   Obesity (BMI 30-39.9)   NSTEMI (non-ST elevated myocardial infarction) (HCC)    ASSESSMENT AND PLAN:  [name] is a 63 y.o. 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 Of note, patient recently hospitalized 07/26 - 08/14 for ETOH withdrawal requiring mechanical intubation with PNA due to recurrent aspiration with AoC CHF. Patient was discharged to [facility]. During this admission, Hgb found to be 4.7. Troponin found to be elevated, patient denies any chest pain.  Cardiology was consulted for further evaluation.   # Acute on chronic hypoxic respiratory failure, resolved # COPD exacerbation # Leukocytosis # COVID positive Weaned to baseline O2 is 2L. -Management per primary team.  # Chronic HFpEF Patient does not appear significantly overloaded. CXR with mild pulmonary vascular congestion.  No lower extremity edema.  -Monitor and replenish electrolytes for a goal K >4, Mag >2  -Hold home torsemide  20 mg daily for now, not clinically fluid overloaded at this time.  Can resume at discharge. -Continue home dapagliflozin  10 mg daily.  # Severe aortic stenosis Echo from 04/2024 revealed severe AS. Aortic valve mean gradient measures  39.0 mmHg. Aortic valve peak gradient 66.3 mmHg. Aortic valve area, by VTI 0.49 cm.   -Originally scheduled to  see Dr. Katina 7/30 to discuss aortic valve repair/replacement options, this has been rescheduled because patient was intubated/sedated due to ETOH  withdrawal at this time. Appointment on 09/03 at [facility].   # Demand ischemia # Coronary artery disease s/p CABG x3 (04/2012) s/p recent stent (07/02/24) # Hypertension # Hyperlipidemia # Hx Ventricular Ectopy Patient without chest pain. Trops elevated and flat 888 > 793 . EKG with no acute ischemic changes (similar to prior EKG).   - Continue home plavix  75 mg and aspirin  81 mg daily. Per GI cleared patient to resume with no evidence of active bleeding at this time. Closely monitor H&H.  -Continue Crestor  20 mg daily for high-intensity statin management.  -Continue home amio 200 mg daily for hx ventricular ectopy. Per tele this admission no evidence of PVCs. Will continue to monitor tele.  -Continue home losartan  25 mg, metoprolol  succinate 25 mg daily. -Continue home Imdur  30 mg daily. -Elevated and flat trops in setting of GI bleed with severe anemia Hgb 4.7 is most consistent with demand/supply mismatch and not ACS. No further cardiac diagnostics at this time.   # GI bleed # Acute on chronic anemia # Hx AVM Upon admission Hgb 4.7. Patient received 2 units PRBCs and 1 unit of platelets. Hgb has remained stable over the last few days. -Closely monitor H&H. -Goal Hgb >8 due to cardiac history. Recommend transfusion when <8.  -Management per primary team.    This patient's plan of care was discussed and created with Dr. Florencio and he is in agreement.  Signed: Dorene Comfort, PA-C  08/19/2024, 10:04 AM [facility]

## 2024-08-19 NOTE — Inpatient Diabetes Management (Signed)
 Inpatient Diabetes Program Recommendations  AACE/ADA: New Consensus Statement on Inpatient Glycemic Control   Target Ranges:  Prepandial:   less than 140 mg/dL      Peak postprandial:   less than 180 mg/dL (1-2 hours)      Critically ill patients:  140 - 180 mg/dL    Latest Reference Range & Units 08/18/24 08:42 08/18/24 11:43 08/18/24 17:59 08/18/24 20:51 08/18/24 23:52 08/19/24 07:25  Glucose-Capillary 70 - 99 mg/dL 865 (H) 802 (H) 697 (H) 226 (H) 199 (H) 269 (H)   Review of Glycemic Control  Diabetes history: No DM Outpatient Diabetes medications: NA Current orders for Inpatient glycemic control: Farxiga  10 mg daily; Prednisone  20 mg QAM  Inpatient Diabetes Program Recommendations:    Insulin : Patient is ordered steroids and CBG 269 mg/dl this morning.  Please consider ordering Novolog  0-9 units AC&HS.   Thanks, Earnie Gainer, RN, MSN, CDCES Diabetes Coordinator Inpatient Diabetes Program [phone] (Team Pager from 8am to 5pm)

## 2024-08-19 NOTE — Progress Notes (Signed)
 Nutrition Follow-up  DOCUMENTATION CODES:   Not applicable  INTERVENTION:   -D/c Ensure Plus High Protein po BID, each supplement provides 350 kcal and 20 grams of protein  -Magic cup TID with meals, each supplement provides 290 kcal and 9 grams of protein  -Continue MVI with minerals daily -Continue 1 mg folic acid  daily -Continue 100 mg thiamine  daily -Liberalize diet to 2 gram sodium for wider variety of meal selections -Case discussed with DM coordinator re: insulin  needs secondary to hyperglycemia   NUTRITION DIAGNOSIS:   Increased nutrient needs related to catabolic illness (COVID 19) as evidenced by estimated needs.  Ongoing  GOAL:   Patient will meet greater than or equal to 90% of their needs  Progressing   MONITOR:   PO intake, Supplement acceptance, Labs, Weight trends, I & O's, Skin  REASON FOR ASSESSMENT:   Malnutrition Screening Tool    ASSESSMENT:   63 y/o male with h/o HLD, etoh abuse, HTN, CAD s/p CABG x 3 (2013), severe aortic stenosis, CHF, CVA, IDA, COPD, MI, GERD, hiatal hernia, CKD III, intestional AVM, depression, left heart catheterization s/p left heart cath and stent placement 7/16 and recent admission for NSTEMI, CHF/COPD exacerbation, suspected aspiration and etoh withdrawal and who is now admitted with GIB and COVID 19.  Reviewed I/O's: -1.7 L x 24 hours and -1.6 L since admission  UOP: 2.7 L x 24 hours  Spoke with pt at bedside, who was pleasant and in good spirits today. Pt reports not sleeping last night due to multiple alarms going off.   Pt shares that he has a good appetite. He consumed 100% of his tray. He denies any difficulty chewing or swallowing foods and is able to tolerating current diet texture without difficulty. He consumed [employer] and will drink Ensure when they give them to me. Documented meal completions 60-100%.   Discussed importance of good meal and supplement intake to promote healing.   No wt loss since  admission.   Medications reviewed and include vitamin C , neurontin , MVI, miralax , thiamine , prednisone , and vitamin D .   Per TOC notes, plan to discharge home with home health services once medically stable.   Labs reviewed: CBGS: 134-302 (inpatient orders for glycemic control are none). Suspect steroids are contributing to hyperglycemia.   NUTRITION - FOCUSED PHYSICAL EXAM:  Flowsheet Row Most Recent Value  Orbital Region No depletion  Upper Arm Region Mild depletion  Thoracic and Lumbar Region No depletion  Buccal Region No depletion  Temple Region No depletion  Clavicle Bone Region No depletion  Clavicle and Acromion Bone Region No depletion  Scapular Bone Region No depletion  Dorsal Hand No depletion  Patellar Region Mild depletion  Anterior Thigh Region Mild depletion  Posterior Calf Region Mild depletion  Edema (RD Assessment) Mild  Hair Reviewed  Eyes Reviewed  Mouth Reviewed  Skin Reviewed  Nails Reviewed    Diet Order:   Diet Order             Diet Heart Room service appropriate? Yes; Fluid consistency: Thin  Diet effective now                   EDUCATION NEEDS:   Education needs have been addressed  Skin:  Skin Assessment: Reviewed RN Assessment  Last BM:  08/18/24 (type 3)  Height:   Ht Readings from Last 1 Encounters:  08/12/24 6' (1.829 m)    Weight:   Wt Readings from Last 1 Encounters:  08/19/24 103.5  kg    Ideal Body Weight:  80.9 kg  BMI:  Body mass index is 30.95 kg/m.  Estimated Nutritional Needs:   Kcal:  2300-2600kcal/day  Protein:  115-130g/day  Fluid:  2.0L/day    Margery ORN, RD, LDN, CDCES Registered Dietitian III Certified Diabetes Care and Education Specialist If unable to reach this RD, please use RD Inpatient group chat on secure chat between hours of 8am-4 pm daily

## 2024-08-19 NOTE — TOC Progression Note (Signed)
 Transition of Care [facility]) - Progression Note    Patient Details  Name: [name] MRN: [id number] Date of Birth: 05-May-1961  Transition [facility]) CM/SW Contact  K'La JINNY Ruts, LCSW Phone Number: 08/19/2024, 12:05 PM  Clinical Narrative:    Chart reviewed. I spoke with the patient and informed him that he was able to return to [facility]. The patient was accepting and will return to [facility] at D/C. I also informed the patient that he has 8 more no copay days at a SNF. The patient didn't have any questions at the end of the conversation.       Barriers to Discharge: Continued Medical Work up               Expected Discharge Plan and Services       Living arrangements for the past 2 months: Skilled Nursing Facility, Single Family Home Expected Discharge Date: 08/19/24                                     Social Drivers of Health (SDOH) Interventions SDOH Screenings   Food Insecurity: No Food Insecurity (08/13/2024)  Housing: Low Risk  (08/13/2024)  Transportation Needs: No Transportation Needs (08/13/2024)  Utilities: Not At Risk (08/13/2024)  Social Connections: Socially Isolated (08/13/2024)  Tobacco Use: High Risk (08/12/2024)    Readmission Risk Interventions    08/13/2024   12:01 PM 05/14/2024    8:44 AM 10/27/2022   11:28 AM  Readmission Risk Prevention Plan  Transportation Screening Complete  Complete  PCP or Specialist Appt within 5-7 Days   Complete  PCP or Specialist Appt within 3-5 Days  Complete   Home Care Screening   Complete  Medication Review (RN CM)   Complete  HRI or Home Care Consult  Complete   Social Work Consult for Recovery Care Planning/Counseling  Complete   Palliative Care Screening  Not Applicable   Medication Review Oceanographer) Complete Complete   Palliative Care Screening Not Applicable    Skilled Nursing Facility Complete

## 2024-08-19 NOTE — Plan of Care (Signed)

## 2024-08-19 NOTE — Plan of Care (Signed)
  Problem: Education: Goal: Knowledge of General Education information will improve Description: Including pain rating scale, medication(s)/side effects and non-pharmacologic comfort measures Outcome: Progressing   Problem: Health Behavior/Discharge Planning: Goal: Ability to manage health-related needs will improve Outcome: Progressing   Problem: Clinical Measurements: Goal: Ability to maintain clinical measurements within normal limits will improve Outcome: Progressing Goal: Will remain free from infection Outcome: Progressing Goal: Diagnostic test results will improve Outcome: Progressing Goal: Respiratory complications will improve Outcome: Progressing Goal: Cardiovascular complication will be avoided Outcome: Progressing   Problem: Activity: Goal: Risk for activity intolerance will decrease Outcome: Progressing   Problem: Nutrition: Goal: Adequate nutrition will be maintained Outcome: Progressing   Problem: Coping: Goal: Level of anxiety will decrease Outcome: Progressing   Problem: Elimination: Goal: Will not experience complications related to bowel motility Outcome: Progressing Goal: Will not experience complications related to urinary retention Outcome: Progressing   Problem: Pain Managment: Goal: General experience of comfort will improve and/or be controlled Outcome: Progressing   Problem: Safety: Goal: Ability to remain free from injury will improve Outcome: Progressing   Problem: Skin Integrity: Goal: Risk for impaired skin integrity will decrease Outcome: Progressing   Problem: Education: Goal: Knowledge of risk factors and measures for prevention of condition will improve Outcome: Progressing   Problem: Coping: Goal: Psychosocial and spiritual needs will be supported Outcome: Progressing   Problem: Respiratory: Goal: Will maintain a patent airway Outcome: Progressing Goal: Complications related to the disease process, condition or treatment  will be avoided or minimized Outcome: Progressing

## 2024-08-19 NOTE — TOC Progression Note (Signed)
 Transition of Care [facility]) - Progression Note    Patient Details  Name: [name] MRN: [id number] Date of Birth: [birth year]  Transition of Care [facility]) CM/SW Contact  K'La JINNY Ruts, LCSW Phone Number: 08/19/2024, 11:19 AM  Clinical Narrative:    Chart Reviewed. Per nurse the patient friend refused to let the patient return home. I called [facility] to see if the patient could return. WOM rep, Barnie will call me back with an decision. I will follow up with the patient.      Barriers to Discharge: Continued Medical Work up               Expected Discharge Plan and Services       Living arrangements for the past 2 months: Skilled Nursing Facility, Single Family Home Expected Discharge Date: 08/19/24                                     Social Drivers of Health (SDOH) Interventions SDOH Screenings   Food Insecurity: No Food Insecurity (08/13/2024)  Housing: Low Risk  (08/13/2024)  Transportation Needs: No Transportation Needs (08/13/2024)  Utilities: Not At Risk (08/13/2024)  Social Connections: Socially Isolated (08/13/2024)  Tobacco Use: High Risk (08/12/2024)    Readmission Risk Interventions    08/13/2024   12:01 PM 05/14/2024    8:44 AM 10/27/2022   11:28 AM  Readmission Risk Prevention Plan  Transportation Screening Complete  Complete  PCP or Specialist Appt within 5-7 Days   Complete  PCP or Specialist Appt within 3-5 Days  Complete   Home Care Screening   Complete  Medication Review (RN CM)   Complete  HRI or Home Care Consult  Complete   Social Work Consult for Recovery Care Planning/Counseling  Complete   Palliative Care Screening  Not Applicable   Medication Review Oceanographer) Complete Complete   Palliative Care Screening Not Applicable    Skilled Nursing Facility Complete

## 2024-08-19 NOTE — Progress Notes (Signed)
 Progress Note   Patient: [name] FMW:[id number] DOB: 1961-02-19 DOA: 08/12/2024     7 DOS: the patient was seen and examined on 08/19/2024   Brief hospital course: Partly taken from prior notes.  [name] is a 63 y.o. male PMH multiple medical comorbidities including COPD on 2 L nasal cannula, CAD with prior CABG and recent stent placement, HLD, stroke, dementia, diastolic CHF, prior GI bleed not on anticoagulation (was on dual antiplatelet therapy) presents for evaluation of reported syncopal episode, apparently patient has a bloody bowel movement.  Pt has a hx of AVM's undergone multiple bidrectional endoscopies without findings of bleeding within the last 2 yrs.   He was recently discharged from [facility] following a prolonged hospitalization 07/26-08/14 following treatment of ETOH withdrawal requiring mechanical intubation due to worsening acute respiratory failure secondary to pneumonia due to recurrent aspiration along with acute on chronic diastolic CHF and cardiac arrest.  He was discharged to [facility].   On presentation patient was hypotensive in 70s, labs with sodium of 133, BUN 33, creatinine 1.68, BNP 1514, troponin 888, lactic acid 3.2, leukocytosis at 12.2 and hemoglobin of 4.7. Chest x-ray concerning for pulmonary vascular congestion and small pleural effusion.  Patient was initially admitted in ICU for concern of hemorrhagic shock secondary to acute GI bleed.  GI was consulted. 2 unit PRBC ordered.  Home aspirin  and Plavix  was held.  8/27: Vital stable, did not require any pressors, hemoglobin improved to 8.3, WBC 15, sodium 134, BUN 28, creatinine 1.56, T. bili at 1.3, MRSA PCR positive, respiratory panel positive for COVID.  Patient received 2 unit of PRBC and 1 unit of platelets. Troponin peaked at 888.  Adding steroid and increasing the frequency of DuoNeb due to developing wheezing and shortness of breath.  Starting on cefepime  and vancomycin  while checking  procalcitonin due to worsening leukocytosis and shortness of breath.  Patient with significant underlying comorbidities and high risk for mortality, palliative care was also consulted.  8/28: Hemodynamically stable, he was placed on heated high flow overnight, now weaned back to baseline O2 of 2 L. No more bloody bowel movement but hemoglobin decreased to 8 today.  Improving creatinine.  Procalcitonin was negative so antibiotics were discontinued.  8/29: Hemodynamically stable, on 2 L of oxygen.  Hemoglobin with further decreased to 8 today.  Giving 1 unit of PRBC due to recent NSTEMI.  Restarting aspirin  and Plavix  as there is no active bleeding but he did not had any bowel movement since in the hospital-working on bowel regimen.  8/30.  Hemoglobin 9.4.  Patient complains of shortness of breath today.  Will give a dose of Lasix  and chest x-ray is negative except for nodular opacity right upper lung seen on prior CT scan. 8/31.  Patient was tapered off oxygen today.  Hemoglobin 9.8, creatinine 1.37 9/1.  Patient states he chronically wears 2 L of oxygen.  Continue to taper prednisone .  Hemoglobin 9.5.  Will give IV Venofer . 9/2.  Patient's hemoglobin 9.7.  Patient felt like he was ready to go home.  I was going to discharge him home but apparently the landlord changed his locks.  TOC reached out to [facility] and they can take him back but waiting to get confirmation on whether they can bring him back to the outpatient appointment with Dr. Katina (valve specialist).  Will keep patient here and spoke with cardiology about Dr. Katina seeing him while here.  Assessment and Plan: * GI bleed  Acute blood loss anemia. 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   Patient received 5 units of packed red blood cells during the hospital course - Aspirin  and Plavix  restarted on 8/29. - Today's hemoglobin 9.7 - Received IV iron  - Bleedings with AVMs may improve after valve  surgery.     Hemorrhagic shock (HCC) Initial concern of hemorrhagic shock with significant hypotension so he was admitted in ICU. Levophed  was ordered but never given. This has resolved  COVID-19 virus infection Prednisone  taper, supportive care  Acute on chronic diastolic CHF (congestive heart failure) (HCC) Patient also has severe aortic stenosis.  Patient on metoprolol  and Cozaar .  Case discussed with cardiology.  Had outpatient appointment tomorrow with Dr. Katina for valve specialist.  Unable to confirm on whether he can get to that appointment.  Cardiology said Dr. Katina can potentially see in the hospital tomorrow.  COPD with acute exacerbation (HCC) Prednisone  taper  Elevated troponin No chest pain, history of recent NSTEMI. Troponin peaked at 888, likely secondary to demand ischemia with GI bleed. - Cardiology consult appreciated. - Patient on aspirin  Plavix  and Imdur  and Crestor   History of CAD (coronary artery disease) Patient with history of NSTEMI s/p PCI in July 2025 Troponin elevated, likely secondary to demand with GI bleed. Initially aspirin  and Plavix  was held but restarted.  Acute on chronic hypoxic respiratory failure (HCC) Chronically on 2 L  Severe aortic stenosis Patient has appointment tomorrow as outpatient with Dr. Katina.  Unable to confirm whether facility can bring him to that appointment.  Spoke with cardiology and Dr. Katina can see tomorrow in the hospital.  Obesity (BMI 30-39.9) Class I with a BMI of 30.95  CKD stage 3a, GFR 45-59 ml/min (HCC) Creatinine 1.42 with a GFR 55        Subjective: Patient feels better.  Offers no complaints.  No further bleeding  Physical Exam: Vitals:   08/18/24 2352 08/19/24 0521 08/19/24 0748 08/19/24 1008  BP: 116/70  (!) 157/85 135/67  Pulse: 68  81 93  Resp: 18  20 (!) 22  Temp: 98.4 F (36.9 C)  (!) 97.5 F (36.4 C) 97.6 F (36.4 C)  TempSrc: Oral  Oral   SpO2: 100%  100% 99%  Weight:  103.5  kg    Height:       Physical Exam HENT:     Head: Normocephalic.     Mouth/Throat:     Pharynx: No oropharyngeal exudate.  Eyes:     General: Lids are normal.     Conjunctiva/sclera: Conjunctivae normal.  Cardiovascular:     Rate and Rhythm: Normal rate and regular rhythm.     Heart sounds: Normal heart sounds, S1 normal and S2 normal.  Pulmonary:     Effort: No accessory muscle usage.     Breath sounds: Examination of the right-middle field reveals decreased breath sounds. Examination of the left-middle field reveals decreased breath sounds. Examination of the right-lower field reveals decreased breath sounds. Examination of the left-lower field reveals decreased breath sounds. Decreased breath sounds present. No wheezing, rhonchi or rales.  Abdominal:     Palpations: Abdomen is soft.     Tenderness: There is no abdominal tenderness.  Musculoskeletal:     Right lower leg: No swelling.     Left lower leg: No swelling.  Skin:    General: Skin is warm.     Findings: No rash.  Neurological:     Mental Status: He is  alert and oriented to person, place, and time.     Data Reviewed: Creatinine 1.42 with a GFR 55, hemoglobin 9.7  Family Communication: Spoke with patient's friend on the phone  Disposition: Status is: Inpatient Remains inpatient appropriate because: Unfortunately unable to get home since his landlord had changed the locks and friend unable to bring home.  TOC looking into getting him back to [facility].  Unable to confirm on whether they can bring him back for a specialist appointment tomorrow with Dr. Katina.  Case discussed with cardiology team and Dr. Katina can see here tomorrow.  Planned Discharge Destination: Rehab    Time spent: 28 minutes  Author: Charlie Patterson, MD 08/19/2024 12:56 PM  For on call review [email or web address].

## 2024-08-20 DIAGNOSIS — I214 Non-ST elevation (NSTEMI) myocardial infarction: Secondary | ICD-10-CM | POA: Diagnosis not present

## 2024-08-20 DIAGNOSIS — I350 Nonrheumatic aortic (valve) stenosis: Secondary | ICD-10-CM | POA: Diagnosis not present

## 2024-08-20 LAB — HEMOGLOBIN AND HEMATOCRIT, BLOOD
HCT: 31 % — ABNORMAL LOW (ref 39.0–52.0)
Hemoglobin: 9.4 g/dL — ABNORMAL LOW (ref 13.0–17.0)

## 2024-08-20 LAB — BASIC METABOLIC PANEL WITH GFR
Anion gap: 8 (ref 5–15)
BUN: 29 mg/dL — ABNORMAL HIGH (ref 8–23)
CO2: 29 mmol/L (ref 22–32)
Calcium: 8.4 mg/dL — ABNORMAL LOW (ref 8.9–10.3)
Chloride: 101 mmol/L (ref 98–111)
Creatinine, Ser: 1.15 mg/dL (ref 0.61–1.24)
GFR, Estimated: >60 mL/min (ref >60)
Glucose, Bld: 173 mg/dL — ABNORMAL HIGH (ref 70–99)
Potassium: 4.3 mmol/L (ref 3.5–5.1)
Sodium: 138 mmol/L (ref 135–145)

## 2024-08-20 LAB — GLUCOSE, CAPILLARY
Glucose-Capillary: 150 mg/dL — ABNORMAL HIGH (ref 70–99)
Glucose-Capillary: 152 mg/dL — ABNORMAL HIGH (ref 70–99)
Glucose-Capillary: 169 mg/dL — ABNORMAL HIGH (ref 70–99)
Glucose-Capillary: 192 mg/dL — ABNORMAL HIGH (ref 70–99)
Glucose-Capillary: 200 mg/dL — ABNORMAL HIGH (ref 70–99)
Glucose-Capillary: 303 mg/dL — ABNORMAL HIGH (ref 70–99)

## 2024-08-20 MED ORDER — TORSEMIDE 20 MG PO TABS
20.0000 mg | ORAL_TABLET | Freq: Every day | ORAL | Status: DC
  Administered 2024-08-20 – 2024-08-22 (×3): 20 mg via ORAL
  Filled 2024-08-20 (×3): qty 1

## 2024-08-20 NOTE — Progress Notes (Signed)
 Progress Note   Patient: [name] FMW:[id number] DOB: 1961-02-08 DOA: 08/12/2024     8 DOS: the patient was seen and examined on 08/20/2024   Brief hospital course: [name] is a 63 y.o. male PMH multiple medical comorbidities including COPD on 2 L nasal cannula, CAD with prior CABG and recent stent placement, HLD, stroke, dementia, diastolic CHF, prior GI bleed not on anticoagulation (was on dual antiplatelet therapy) presents for evaluation of reported syncopal episode, apparently patient has a bloody bowel movement.   Patient was initially admitted in ICU for concern of hemorrhagic shock secondary to acute GI bleed. S/p multiple units pRBC and platelet transfusion and IV Venofer , Seen by GI. Found to be COVID positive, hypoxia improved.  Seen by Cardiology Dr.Pride (valve specialist) plan to follow up outpatient.   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   Patient received 5 units of packed red blood cells during the hospital course - Aspirin  and Plavix  restarted on 8/29 - Received IV iron  - Bleedings with AVMs may improve after valve surgery - Monitor Hb, 9.4 today   Hemorrhagic shock (HCC) Initial concern of hemorrhagic shock with significant hypotension so he was admitted in ICU. Levophed  was ordered but never given. This has resolved   Acute on chronic diastolic CHF (congestive heart failure) (HCC) CXR with mild pulmonary vascular congestion, mild lower extremity edema. Resume home torsemide  20 mg, dapagliflozin  10mg  daily  Severe aortic stenosis Echo 05/25 revealed severe AS.  Aortic valve peak gradient 66.3 mmHg Seen by Dr. Katina, discussed aortic valve repair/replacement.  As [facility] does not have protocols to perform CT TAVR imaging, radiology working on protocols which can be done in the future. Plan to do CT TAVR at [facility] if possible, or at  [facility]   COPD with acute exacerbation (HCC) COVID-19 virus infection Prednisone  taper   History of CAD (coronary artery disease) s/p CABG (2013), stent (7/25) Patient with history of NSTEMI s/p PCI in July 2025 Troponin elevated, likely secondary to demand with GI bleed. Initially aspirin  and Plavix  was held but restarted. Continue Crestor , losartan , metoprolol , Imdur   Hx ventricular ectopy Continue home amiodarone , metoprolol    Acute on chronic hypoxic respiratory failure - resolved Chronically on 2 L   Obesity (BMI 30-39.9) Class I with a BMI of 30.95   CKD stage 3a, GFR 45-59 ml/min (HCC) Creatinine 1.42 with a GFR 55   Subjective: Patient feels better.  Offers no complaints.  No further bleeding  Physical Exam: Vitals:   08/19/24 2351 08/20/24 0420 08/20/24 0500 08/20/24 0802  BP: 118/72 (!) 106/57  114/68  Pulse: 74 71  75  Resp: 20 19  18   Temp: 97.9 F (36.6 C) 98.9 F (37.2 C)  97.6 F (36.4 C)  TempSrc:      SpO2: 100% 95%  100%  Weight:   104.5 kg   Height:       General: Chronically ill-appearing elderly male, well nourished, in no acute distress. HEENT: Normocephalic and atraumatic. Neck: No JVD.   Lungs: Normal respiratory effort on 2L [place]. Clear bilaterally to auscultation, diminished bilaterally. Heart: HRRR. Normal S1 and S2, + systolic murmur.  Abdomen: Non-distended appearing.  Msk: Normal strength and tone for age. Extremities: Warm and well perfused. No clubbing, cyanosis, 1+ pedal edema.  Neuro: Alert and oriented X 3. Psych: Answers questions appropriately  Labs reviewed  Family Communication: Discussed with patient at the bedside  Disposition: Status is: Inpatient Remains inpatient appropriate because: Unfortunately unable to get home since his landlord had changed the locks and friend unable to bring home.  TOC looking into getting him back to [facility].   Planned Discharge Destination: Rehab    Time spent: 35  minutes  Author: Laree Lock, MD 08/20/2024 9:41 AM  For on call review [email or web address].

## 2024-08-20 NOTE — Progress Notes (Addendum)
 [facility] CLINIC CARDIOLOGY PROGRESS NOTE       Patient ID: [name] MRN: [id number] DOB/AGE: 65-Jul-1962 65 y.o.  Admit date: 08/12/2024 Referring Physician Dr. Caleen Primary Physician Osa Geralds, NP Primary Cardiologist Tinnie Maiden, NP/ Dr. Katina Reason for Consultation elevated trop, severe AS, complicated cardiac hx  HPI: [name] is a 63 y.o. 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 Of note, patient recently hospitalized 07/26 - 08/14 for ETOH withdrawal requiring mechanical intubation with PNA due to recurrent aspiration with AoC CHF. Patient was discharged to [facility]. During this admission, Hgb found to be 4.7. Troponin found to be elevated, patient denies any chest pain.  Cardiology was consulted for further evaluation.   Interval History: -Patient seen and examined this AM and laying comfortably in hospital bed, nearly flat. Patient states he he feels okay today. Reports some SOB this AM. Continues to deny any chest pain/pressure, palpitations or lightheadedness. -Appears slightly volume up with + pedal edema -Patients BP and HR stable this AM. Overnight Tele showed no significant events.  -Patient remains on 2L [place] (at baseline).  -Hemoglobin stable this a.m.  Pertinent Cardiac History (Most recent) LHC 07/02/24   Prox LAD to Mid LAD lesion is 90% stenosed.   Ost LM to Mid LM lesion is 50% stenosed.   Ost Cx to Prox Cx lesion is 90% stenosed.   Ost RCA to Prox RCA lesion is 100% stenosed.   Mid Graft to Dist Graft lesion is 95%  stenosed.   Recommend uninterrupted dual antiplatelet therapy with Aspirin  81mg  daily and Clopidogrel  75mg  daily for a minimum of 6 months (stable ischemic heart disease-Class I recommendation).   1.  Severe native three-vessel CAD 2.  LIMA to LAD and SVG to OM widely patent 3.  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  Successful direct stenting of the distal SVG to PDA with distal protection with a 4.0 x 15 mm drug-eluting stent with intravascular ultrasound guidance 5.  Aspirin  and clopidogrel  for at least 6 months followed by clopidogrel  indefinitely 6.  Continue workup for aortic valve replacement    Review of systems complete and found to be negative unless listed above    Past Medical History:  Diagnosis Date   COPD (chronic obstructive pulmonary disease) (HCC)    CVA (cerebral infarction)    Headache    mild, since stroke 2012   Hypertension    MI (myocardial infarction) (HCC) 05/09/2012   Reading difficulty    pt reports he reads at about a second grade level   Stroke [facility]) 2012   numbness to left hand    Wears dentures    full upper and lower    Past Surgical History:  Procedure Laterality Date   CAROTID ENDARTERECTOMY Left    2012 or 2013   CHOLECYSTECTOMY N/A 07/26/2016   Procedure: LAPAROSCOPIC CHOLECYSTECTOMY;  Surgeon: Charlie FORBES Fell, MD;  Location: [facility] ORS;  Service: General;  Laterality: N/A;   COLONOSCOPY N/A 05/08/2024   Procedure: COLONOSCOPY;  Surgeon: Jinny Carmine, MD;  Location: [facility]  ENDOSCOPY;  Service: Endoscopy;  Laterality: N/A;   COLONOSCOPY WITH PROPOFOL  N/A 02/09/2022   Procedure: COLONOSCOPY WITH PROPOFOL ;  Surgeon: Jinny Carmine, MD;  Location: [facility] ENDOSCOPY;  Service: Endoscopy;  Laterality: N/A;   CORONARY ARTERY BYPASS GRAFT  05/10/2012   3 vessel   CORONARY STENT INTERVENTION N/A 07/02/2024   Procedure: CORONARY STENT INTERVENTION;  Surgeon: Katina Albright, MD;  Location: [facility] INVASIVE CV LAB;  Service: Cardiovascular;   Laterality: N/A;   CORONARY ULTRASOUND/IVUS N/A 07/02/2024   Procedure: Coronary Ultrasound/IVUS;  Surgeon: Katina Albright, MD;  Location: [facility] INVASIVE CV LAB;  Service: Cardiovascular;  Laterality: N/A;   ENTEROSCOPY N/A 05/16/2023   Procedure: ENTEROSCOPY;  Surgeon: Therisa Bi, MD;  Location: [facility] ENDOSCOPY;  Service: Gastroenterology;  Laterality: N/A;   ENTEROSCOPY N/A 06/01/2023   Procedure: ENTEROSCOPY;  Surgeon: Unk Corinn Skiff, MD;  Location: [facility] ENDOSCOPY;  Service: Gastroenterology;  Laterality: N/A;   ESOPHAGOGASTRODUODENOSCOPY  05/13/2023   Procedure: ESOPHAGOGASTRODUODENOSCOPY (EGD);  Surgeon: Jinny Carmine, MD;  Location: [facility] ENDOSCOPY;  Service: Endoscopy;;   ESOPHAGOGASTRODUODENOSCOPY (EGD) WITH PROPOFOL  N/A 10/28/2022   Procedure: ESOPHAGOGASTRODUODENOSCOPY (EGD) WITH PROPOFOL ;  Surgeon: Onita Elspeth Sharper, DO;  Location: [facility] ENDOSCOPY;  Service: Gastroenterology;  Laterality: N/A;   GIVENS CAPSULE STUDY  05/13/2023   Procedure: GIVENS CAPSULE STUDY;  Surgeon: Jinny Carmine, MD;  Location: [facility] ENDOSCOPY;  Service: Endoscopy;;   GIVENS CAPSULE STUDY  06/01/2023   Procedure: GIVENS CAPSULE STUDY;  Surgeon: Unk Corinn Skiff, MD;  Location: [facility] ENDOSCOPY;  Service: Gastroenterology;;   LEFT HEART CATH AND CORS/GRAFTS ANGIOGRAPHY N/A 07/02/2024   Procedure: LEFT HEART CATH AND CORS/GRAFTS ANGIOGRAPHY;  Surgeon: Katina Albright, MD;  Location: [facility] INVASIVE CV LAB;  Service: Cardiovascular;  Laterality: N/A;    Medications Prior to Admission  Medication Sig Dispense Refill Last Dose/Taking   budesonide  (PULMICORT ) 0.5 MG/2ML nebulizer solution Take 2 mLs (0.5 mg total) by nebulization 2 (two) times daily.   08/12/2024 at  8:13 AM   folic acid  (FOLVITE ) 1 MG tablet Take 1 tablet (1 mg total) by mouth daily. 30 tablet 2 08/12/2024 at  8:13 AM   Glycerin , Laxative, (GLYCERIN , ADULT,) 2 g SUPP Place 1 suppository rectally daily as needed for moderate constipation.   Unknown    HYDROcodone -acetaminophen  (NORCO/VICODIN) 5-325 MG tablet Take 1 tablet by mouth every 6 (six) hours as needed for moderate pain (pain score 4-6).   Unknown   ipratropium-albuterol  (DUONEB) 0.5-2.5 (3) MG/3ML SOLN Take 3 mLs by nebulization 3 (three) times daily.   08/12/2024 at 11:44 AM   iron  polysaccharides (NIFEREX) 150 MG capsule Take 1 capsule (150 mg total) by mouth daily. 30 capsule 2 08/12/2024 at  8:13 AM   lidocaine  4 % Place 1 patch onto the skin daily.   08/12/2024 at  8:13 AM   senna-docusate (SENOKOT-S) 8.6-50 MG tablet Take 2 tablets by mouth at bedtime.   08/11/2024 at  9:22 AM   sodium chloride  (OCEAN) 0.65 % SOLN nasal spray Place 1 spray into both nostrils as needed for congestion.   Unknown   torsemide  (DEMADEX ) 20 MG tablet Take 1 tablet (20 mg total) by mouth daily.   08/12/2024 at  8:13 AM   traZODone  (DESYREL ) 50 MG tablet Take 50 mg by mouth at bedtime.   08/11/2024 at  9:22 PM   [DISCONTINUED] albuterol  (VENTOLIN  HFA) 108 (90 Base) MCG/ACT inhaler Inhale 2 puffs into the lungs every 6 (six) hours as needed for wheezing or shortness of breath. 6.7 g 2 08/12/2024 at  12:56 AM   [DISCONTINUED] amiodarone  (PACERONE ) 200 MG tablet Take 1 tablet (200 mg total) by mouth daily.   08/12/2024 at  8:13 AM   [DISCONTINUED] ascorbic acid  (VITAMIN C ) 500 MG tablet Take 1 tablet (500 mg total) by mouth daily. 30 tablet 2 08/12/2024 at  8:13 AM   [DISCONTINUED] aspirin  EC 81 MG tablet Take 1 tablet (81 mg total) by mouth daily. Swallow whole. 30 tablet 5 08/12/2024 at  8:13 AM   [DISCONTINUED] clopidogrel  (PLAVIX ) 75 MG tablet Take 1 tablet (75 mg total) by mouth daily with breakfast. 30 tablet 11 08/12/2024 at  5:35 AM   [DISCONTINUED] dapagliflozin  propanediol (FARXIGA ) 10 MG TABS tablet Take 1 tablet (10 mg total) by mouth daily. 30 tablet 11 08/12/2024 at  8:13 AM   [DISCONTINUED] dextromethorphan -guaiFENesin  (MUCINEX  DM) 30-600 MG 12hr tablet Take 1 tablet by mouth 2 (two) times daily as needed for  cough.   Unknown   [DISCONTINUED] lactulose  (CHRONULAC ) 10 GM/15ML solution Take 30 mLs (20 g total) by mouth daily as needed for moderate constipation or mild constipation.   08/10/2024   [DISCONTINUED] melatonin 5 MG TABS Place 1 tablet (5 mg total) into feeding tube at bedtime as needed.   Unknown   [DISCONTINUED] Multiple Vitamin (MULTIVITAMIN WITH MINERALS) TABS tablet Take 1 tablet by mouth daily.   08/12/2024 at  8:13 AM   [DISCONTINUED] nicotine  (NICODERM CQ  - DOSED IN MG/24 HR) 7 mg/24hr patch Place 1 patch (7 mg total) onto the skin daily.   08/12/2024 at  8:13 AM   [DISCONTINUED] polyethylene glycol (MIRALAX  / GLYCOLAX ) 17 g packet Take 17 g by mouth at bedtime.   08/10/2024   [DISCONTINUED] rosuvastatin  (CRESTOR ) 20 MG tablet Take 1 tablet (20 mg total) by mouth daily.   08/11/2024 at  9:22 PM   [DISCONTINUED] thiamine  (VITAMIN B-1) 100 MG tablet Take 1 tablet (100 mg total) by mouth daily.   08/12/2024 at  8:13 AM   [DISCONTINUED] Vitamin D , Ergocalciferol , (DRISDOL ) 1.25 MG (50000 UNIT) CAPS capsule Take 1 capsule (50,000 Units total) by mouth every 7 (seven) days.   08/09/2024   feeding supplement (ENSURE PLUS HIGH PROTEIN) LIQD Take 237 mLs by mouth 3 (three) times daily between meals.      Social History   Socioeconomic History   Marital status: Divorced    Spouse name: Not on file   Number of children: Not on file   Years of education: Not on file   Highest education level: Not on file  Occupational History   Not on file  Tobacco Use   Smoking status: Former    Current packs/day: 0.00    Types: Cigarettes    Quit date: 50    Years since quitting: 32.6   Smokeless tobacco: Current    Types: Snuff   Tobacco comments:    changed to dip  Vaping Use   Vaping status: Never Used  Substance and Sexual Activity   Alcohol  use: Yes    Alcohol /week: 26.0 standard drinks of alcohol     Types: 26 Cans of beer per week   Drug use: No   Sexual activity: Yes    Birth  control/protection: None  Other Topics Concern   Not on file  Social History Narrative   Not on file   Social Drivers of Health   Financial Resource Strain: Not on file  Food Insecurity: No Food Insecurity (08/13/2024)   Hunger Vital Sign    Worried About Running Out of Food  in the Last Year: Never true    Ran Out of Food in the Last Year: Never true  Transportation Needs: No Transportation Needs (08/13/2024)   PRAPARE - Administrator, Civil Service (Medical): No    Lack of Transportation (Non-Medical): No  Physical Activity: Not on file  Stress: Not on file  Social Connections: Socially Isolated (08/13/2024)   Social Connection and Isolation Panel    Frequency of Communication with Friends and Family: More than three times a week    Frequency of Social Gatherings with Friends and Family: More than three times a week    Attends Religious Services: Never    Database administrator or Organizations: No    Attends Banker Meetings: Never    Marital Status: Divorced  Catering manager Violence: Not At Risk (08/13/2024)   Humiliation, Afraid, Rape, and Kick questionnaire    Fear of Current or Ex-Partner: No    Emotionally Abused: No    Physically Abused: No    Sexually Abused: No    Family History  Problem Relation Age of Onset   Pneumonia Mother      Vitals:   08/19/24 2351 08/20/24 0420 08/20/24 0500 08/20/24 0802  BP: 118/72 (!) 106/57  114/68  Pulse: 74 71  75  Resp: 20 19  18   Temp: 97.9 F (36.6 C) 98.9 F (37.2 C)  97.6 F (36.4 C)  TempSrc:      SpO2: 100% 95%  100%  Weight:   104.5 kg   Height:        PHYSICAL EXAM General: Chronically ill-appearing elderly male, well nourished, in no acute distress. HEENT: Normocephalic and atraumatic. Neck: No JVD.   Lungs: Normal respiratory effort on 2L [place]. Clear bilaterally to auscultation, diminished bilaterally. Heart: HRRR. Normal S1 and S2, + systolic murmur.  Abdomen: Non-distended  appearing.  Msk: Normal strength and tone for age. Extremities: Warm and well perfused. No clubbing, cyanosis, 1+ pedal edema.  Neuro: Alert and oriented X 3. Psych: Answers questions appropriately.   Labs: Basic Metabolic Panel: Recent Labs    08/19/24 0555 08/20/24 0620  NA 137 138  K 4.4 4.3  CL 98 101  CO2 30 29  GLUCOSE 154* 173*  BUN 29* 29*  CREATININE 1.42* 1.15  CALCIUM  8.5* 8.4*   Liver Function Tests: No results for input(s): AST, ALT, ALKPHOS, BILITOT, PROT, ALBUMIN  in the last 72 hours.  No results for input(s): LIPASE, AMYLASE in the last 72 hours.  CBC: Recent Labs    08/19/24 0555 08/20/24 0620  HGB 9.7* 9.4*  HCT  --  31.0*   Cardiac Enzymes: No results for input(s): CKTOTAL, CKMB, CKMBINDEX, TROPONINIHS in the last 72 hours.  BNP: No results for input(s): BNP in the last 72 hours.  D-Dimer: No results for input(s): DDIMER in the last 72 hours. Hemoglobin A1C: Recent Labs    08/19/24 0555  HGBA1C 5.3   Fasting Lipid Panel: No results for input(s): CHOL, HDL, LDLCALC, TRIG, CHOLHDL, LDLDIRECT in the last 72 hours. Thyroid Function Tests: No results for input(s): TSH, T4TOTAL, T3FREE, THYROIDAB in the last 72 hours.  Invalid input(s): FREET3 Anemia Panel: No results for input(s): VITAMINB12, FOLATE, FERRITIN, TIBC, IRON , RETICCTPCT in the last 72 hours.   Radiology: [facility] Chest Port 1 View Result Date: 08/16/2024 EXAM: 1 VIEW(S) XRAY OF THE CHEST 08/16/2024 10:45:00 AM COMPARISON: CTA chest dated 07/11/2024 and chest X-ray dated 08/13/2024. CLINICAL HISTORY: Shortness of breath FINDINGS: LUNGS AND  PLEURA: No focal consolidation. No pleural effusion. No pneumothorax. A nodular opacity is present in the right upper lung laterally, which is grossly similar to the CTA chest dated 07/11/2024. HEART AND MEDIASTINUM: Stable cardiomediastinal silhouette with evidence of sternotomy and CABG.  BONES AND SOFT TISSUES: No acute osseous abnormality. IMPRESSION: 1. No acute process. 2. Stable nodular opacity in the right upper lung laterally, grossly similar to CTA chest 07/11/2024. Given persistence, follow-up CT in 3-6 months is recommended. Electronically signed by: Norman Gatlin MD 08/16/2024 11:02 AM EDT RP Workstation: [id number]   DG Chest Port 1 View Result Date: 08/13/2024 CLINICAL DATA:  Shortness of breath.  COVID positive. EXAM: PORTABLE CHEST 1 VIEW COMPARISON:  August 12, 2024. FINDINGS: Stable cardiomediastinal silhouette. Status post coronary artery bypass graft. Left lung is clear. Minimal right pleural effusion is noted with associated minimal associated atelectasis. Bony thorax is unremarkable. IMPRESSION: Minimal right pleural effusion is noted with associated minimal right basilar atelectasis. Electronically Signed   By: Lynwood Landy Raddle M.D.   On: 08/13/2024 17:20   DG Chest Portable 1 View Result Date: 08/12/2024 CLINICAL DATA:  Syncopal episode. Septic shock. Clinical concern for pneumonia. EXAM: PORTABLE CHEST 1 VIEW COMPARISON:  07/29/2024. Abdomen and pelvis CTA dated 08/12/2024. Chest CTA dated 07/11/2024. FINDINGS: Interval enlarged cardiac silhouette. Stable post CABG changes. The lungs are hyperexpanded with mildly prominent upper lung zone pulmonary vasculature. Small right pleural effusion. Biapical bullous changes. Unremarkable bones. IMPRESSION: 1. Interval cardiomegaly and mild pulmonary vascular congestion. 2. Small right pleural effusion. 3. COPD with biapical bullous changes. Electronically Signed   By: Elspeth Bathe M.D.   On: 08/12/2024 16:58   CT ANGIO GI BLEED Result Date: 08/12/2024 CLINICAL DATA:  Anemia, syncope and melena. EXAM: CTA ABDOMEN AND PELVIS WITHOUT AND WITH CONTRAST TECHNIQUE: Multidetector CT imaging of the abdomen and pelvis was performed using the standard protocol during bolus administration of intravenous contrast. Multiplanar  reconstructed images and MIPs were obtained and reviewed to evaluate the vascular anatomy. RADIATION DOSE REDUCTION: This exam was performed according to the departmental dose-optimization program which includes automated exposure control, adjustment of the mA and/or kV according to patient size and/or use of iterative reconstruction technique. CONTRAST:  OMNIPAQUE  IOHEXOL  350 MG/ML SOLN COMPARISON:  CT abdomen and pelvis 03/24/2016 FINDINGS: VASCULAR Aorta: Atherosclerosis of the abdominal aorta without aneurysm. Ulcerated plaque of the anterior infrarenal segment. Celiac: Calcified plaque causes approximately 50-60% origin stenosis. Distal branches are normally patent. SMA: Atherosclerosis of SMA trunk without significant stenosis. Renals: Heavily calcified plaque in proximal segments bilateral single renal arteries causes moderate stenoses of approximately 50-70%. The left renal artery almost immediately bifurcates beyond its origin. IMA: Patent small caliber IMA. Inflow: Heavily calcified bilateral iliac arteries with diffuse disease and no aneurysmal disease or obstruction. Proximal Outflow: Calcified common femoral arteries. Probable significant stenosis at the origin of the right SFA. Veins: Venous phase imaging demonstrates patent venous structures in the abdomen and pelvis without thrombus or other abnormalities. Review of the MIP images confirms the above findings. NON-VASCULAR Lower chest: No acute abnormality. Hepatobiliary: Potential early cirrhotic morphology of the liver. No hepatic masses identified. Status post cholecystectomy. No biliary dilatation. Pancreas: Unremarkable. No pancreatic ductal dilatation or surrounding inflammatory changes. Spleen: Normal in size without focal abnormality. Adrenals/Urinary Tract: Adrenal glands are unremarkable. Kidneys are normal, without renal calculi, focal lesion, or hydronephrosis. Bladder is unremarkable. Stomach/Bowel: Speckled high density material in  the stomach is present on imaging prior to contrast administration. There is  no evidence active bleeding on arterial or venous phases of imaging within the gastrointestinal tract. Bowel shows no evidence of obstruction, ileus, inflammation or lesion. The appendix is not discretely visualized. No free intraperitoneal air. Lymphatic: No enlarged abdominal or pelvic lymph nodes. Reproductive: Prostate is unremarkable. Other: No abdominal wall hernia or abnormality. No abdominopelvic ascites. Musculoskeletal: Mild loss of height of the T12 vertebral body. IMPRESSION: 1. No evidence of active bleeding within the gastrointestinal tract. 2. Atherosclerosis of the abdominal aorta and iliac arteries without aneurysm. Ulcerated plaque of the anterior infrarenal aorta. 3. 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 4. Moderate stenoses of approximately 50-70% in proximal bilateral renal arteries. 5. Probable significant stenosis at the origin of the right SFA. 6. Potential early cirrhotic morphology of the liver. 7. Mild loss of height of the T12 vertebral body. Aortic Atherosclerosis (ICD10-I70.0). Electronically Signed   By: Marcey Moan M.D.   On: 08/12/2024 16:21   DG Chest Port 1 View Result Date: 07/29/2024 CLINICAL DATA:  Congestive heart failure. EXAM: PORTABLE CHEST 1 VIEW COMPARISON:  07/24/2024 FINDINGS: The enteric tube has been removed. Post median sternotomy with stable cardiomegaly. Bilateral pleural effusions and bibasilar opacities, slightly worsened in the interim. Improved interstitial opacities with mild residual. Underlying emphysematous change. No pneumothorax. IMPRESSION: 1. Bilateral pleural effusions and bibasilar opacities, slightly worsened in the interim. 2. Improved interstitial opacities with mild residual. Electronically Signed   By: Andrea Gasman M.D.   On: 07/29/2024 09:53   DG Chest 1 View Result Date: 07/24/2024 CLINICAL DATA:  [id number] Dyspnea [id number] EXAM: CHEST  1  VIEW COMPARISON:  July 23, 2024, July 11, 2024 FINDINGS: Esophagogastric tube terminates below the diaphragm, outside the field of view. Diffuse interstitial opacities throughout both lungs. No lobar consolidation or pneumothorax. Lateral right upper lobe nodular opacity measures 1.9 cm, corresponding to the lung nodule on the prior chest CT. Moderate cardiomegaly. Sternotomy wires and CABG markers. No acute fracture or destructive lesions. Multilevel thoracic osteophytosis. IMPRESSION: 1. Moderate cardiomegaly. Unchanged interstitial opacities throughout both lungs, possibly persistent interstitial edema and underlying emphysematous changes. 2. Similarly appearing lateral right upper lung zone nodular opacity. Please see the comparison chest CT for further characterization. Electronically Signed   By: Rogelia Myers M.D.   On: 07/24/2024 08:43   DG Chest Port 1 View Result Date: 07/23/2024 CLINICAL DATA:  Shortness of breath. EXAM: PORTABLE CHEST 1 VIEW COMPARISON:  July 22, 2024. FINDINGS: The heart size and mediastinal contours are within normal limits. Status post coronary bypass graft. Mild diffuse interstitial densities are noted concerning for possible pulmonary edema with possible small pleural effusions. Nasogastric tube is seen entering stomach. The visualized skeletal structures are unremarkable. IMPRESSION: Mild diffuse interstitial densities are noted concerning for possible pulmonary edema with small pleural effusions. Electronically Signed   By: Lynwood Landy Raddle M.D.   On: 07/23/2024 13:03   DG ABD ACUTE 2+V W 1V CHEST Result Date: 07/22/2024 CLINICAL DATA:  [id number] Dyspnea [id number] [phone] Encounter for imaging study to confirm nasogastric (NG) tube placement [id number] EXAM: DG ABDOMEN ACUTE WITH 1 VIEW CHEST COMPARISON:  Chest x-ray 07/19/2024 FINDINGS: Enteric tube courses below the diaphragm with tip and side port overlying the expected region of the gastric lumen. The heart and mediastinal  contours are within normal limits. No focal consolidation. Chronic coarsened interstitial markings with no overt pulmonary edema. Bilateral trace pleural effusion. No pneumothorax. Right upper quadrant surgical clips. There is no evidence of dilated bowel loops or free  intraperitoneal air. No radiopaque calculi or other significant radiographic abnormality is seen. No acute osseous abnormality.  Intact sternotomy wires. IMPRESSION: 1. Enteric tube in good position. 2. Bowel trace pleural effusions. 3. Nonobstructive bowel gas pattern. Electronically Signed   By: Morgane  Naveau M.D.   On: 07/22/2024 14:18    ECHO 05/10/2024  1. Left ventricular ejection fraction, by estimation, is 60 to 65%. The left ventricle has normal function. The left ventricle has no regional wall motion abnormalities. The left ventricular internal cavity size was mildly dilated. Left ventricular diastolic parameters are consistent with Grade I diastolic dysfunction (impaired relaxation).   2. Right ventricular systolic function is normal. The right ventricular size is normal.   3. The mitral valve is normal in structure. No evidence of mitral valve regurgitation.   4. The aortic valve is normal in structure. Aortic valve regurgitation is trivial. Severe aortic valve stenosis.   TELEMETRY reviewed by me 08/20/2024: Sinus rhythm, rate 80s   EKG reviewed by me: sinus rhythm with borderline ST depression (similar to prior EKGs), rate 93 bpm  Data reviewed by me 08/20/2024: last 24h vitals tele labs imaging I/O hospitalist progress notes.  Principal Problem:   GI bleed Active Problems:   COPD with acute exacerbation (HCC)   Elevated troponin   Acute blood loss anemia   CKD stage 3a, GFR 45-59 ml/min (HCC)   Severe aortic stenosis   Acute on chronic diastolic CHF (congestive heart failure) (HCC)   Hemorrhagic shock (HCC)   History of CAD (coronary artery disease)   COVID-19 virus infection   Acute on chronic hypoxic  respiratory failure (HCC)   Obesity (BMI 30-39.9)   NSTEMI (non-ST elevated myocardial infarction) (HCC)    ASSESSMENT AND PLAN:  [name] is a 63 y.o. 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 Of note, patient recently hospitalized 07/26 - 08/14 for ETOH withdrawal requiring mechanical intubation with PNA due to recurrent aspiration with AoC CHF. Patient was discharged to [facility]. During this admission, Hgb found to be 4.7. Troponin found to be elevated, patient denies any chest pain.  Cardiology was consulted for further evaluation.   # Acute on chronic hypoxic respiratory failure, resolved # COPD exacerbation # Leukocytosis # COVID positive Weaned to baseline O2 is 2L. -Management per primary team.  # Chronic HFpEF Patient does not appear significantly overloaded. CXR with mild pulmonary vascular congestion.  Mild lower extremity edema.  -Monitor and replenish electrolytes for a goal K >4, Mag >2  -Resume home torsemide  20 mg daily.  -Continue home dapagliflozin  10 mg daily.  # Severe aortic stenosis Echo from 04/2024 revealed severe AS. Aortic valve mean gradient measures  39.0 mmHg. Aortic valve peak gradient 66.3 mmHg. Aortic valve area, by VTI 0.49 cm.   -Dr. Katina visited patient in hospital and discussed aortic valve repair/replacement. [facility] does not have protocols to perform CT TAVR imaging. Radiology is working on protocols so this can be done in future. Patient has issues with transportation, would like to have CT TAVR imaging done at [facility] if  possible or  will have to arrange transportation to [place] otherwise.    # Demand ischemia # Coronary artery disease s/p CABG x3 (04/2012) s/p recent stent (07/02/24) # Hypertension # Hyperlipidemia # Hx Ventricular Ectopy Patient without chest pain. Trops elevated and flat 888 > 793 . EKG with no acute ischemic changes (similar to prior EKG).   - Continue home plavix  75 mg and aspirin  81 mg daily. Per GI cleared patient to resume with no evidence of active bleeding at this time. Closely monitor H&H.  -Continue Crestor  20 mg daily for high-intensity statin management.  -Continue home amio 200 mg daily for hx ventricular ectopy. Per tele this admission no evidence of PVCs. Will continue to monitor tele.  -Continue home losartan  25 mg, metoprolol  succinate 25 mg daily. -Continue home Imdur  30 mg daily. -Elevated and flat trops in setting of GI bleed with severe anemia Hgb 4.7 is most consistent with demand/supply mismatch and not ACS. No further cardiac diagnostics at this time.   # GI bleed # Acute on chronic anemia # Hx AVM Upon admission Hgb 4.7. Patient received 2 units PRBCs and 1 unit of platelets. Hgb has remained stable over the last few days. -Closely monitor H&H. -Goal Hgb >8 due to cardiac history. Recommend transfusion when <8.  -Management per primary team.   No further cardiac recommendations at this time. Cardiology will sign off. Please haiku with questions or re-engage if needed. Pending SNF placement.   This patient's plan of care was discussed and created with Dr. Florencio and he is in agreement.  Signed: Dorene Comfort, PA-C  08/20/2024, 11:46 AM [facility]

## 2024-08-20 NOTE — NC FL2 (Addendum)
 [place]  MEDICAID [id number] LEVEL OF CARE FORM     IDENTIFICATION  Patient Name: [name] Birthdate: Sep 13, 1961 Sex: male Admission Date (Current Location): 08/12/2024  [place] Number:  Chiropodist and Address:  [facility], [address], [place], [place] [address]      Provider Number: [id number]  Attending Physician Name and Address:  Jerelene Critchley, MD  Relative Name and Phone Number:       Current Level of Care: Hospital Recommended Level of Care: Skilled Nursing Facility Prior Approval Number:    Date Approved/Denied:   PASRR Number: [id number]  Discharge Plan: SNF    Current Diagnoses: Patient Active Problem List   Diagnosis Date Noted   NSTEMI (non-ST elevated myocardial infarction) (HCC) 08/18/2024   Obesity (BMI 30-39.9) 08/17/2024   Hemorrhagic shock (HCC) 08/13/2024   History of CAD (coronary artery disease) 08/13/2024   COVID-19 virus infection 08/13/2024   Acute on chronic hypoxic respiratory failure (HCC) 08/13/2024   GI bleed 08/12/2024   Acute respiratory distress 07/12/2024   Lobar pneumonia (HCC) 07/12/2024   Metabolic acidosis, increased anion gap 07/12/2024   Myocardial injury 07/12/2024   COPD exacerbation (HCC) 07/11/2024   Elevated lactic acid level 07/11/2024   Coronary artery disease 07/02/2024   Acute on chronic diastolic CHF (congestive heart failure) (HCC) 06/25/2024   Severe aortic stenosis 06/24/2024   Depression 05/07/2024   Chronic obstructive pulmonary disease (COPD) (HCC) 05/07/2024   AVM (arteriovenous malformation) of small bowel, 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   Rectal bleeding 06/01/2023   GI bleeding 05/31/2023   CAD (coronary artery disease) 05/31/2023   Stroke (HCC) 05/31/2023   Iron  deficiency anemia 05/31/2023   Hyponatremia 05/13/2023   Alcohol  use disorder 05/13/2023   Elevated troponin 05/13/2023   Acute blood loss anemia 05/13/2023   Iron  deficiency anemia due to  chronic blood loss 10/30/2022   Obesity hypoventilation syndrome (HCC) 10/29/2022   COPD with acute exacerbation (HCC) 10/28/2022   Melena 10/26/2022   Symptomatic anemia 10/25/2022   Acute diastolic CHF (congestive heart failure) (HCC) 10/25/2022   Essential hypertension 10/25/2022   GERD without esophagitis 10/25/2022   Dyslipidemia 10/25/2022   Special screening for malignant neoplasms, colon    Polyp of sigmoid colon    Orthostatic hypotension 06/04/2021   CKD stage 3a, GFR 45-59 ml/min (HCC) 10/05/2020   Alcohol  abuse    Syncope and collapse 05/31/2020   AKI (acute kidney injury) (HCC) 05/31/2020   Hypokalemia 05/31/2020   Memory loss or impairment 12/24/2018   Memory loss of unknown cause 10/14/2018   Sleeping difficulty 10/14/2018   Mild aortic valve stenosis 10/08/2018   Hyperlipidemia 03/27/2018   Cognitive impairment 03/12/2018   History of stroke s/p left carotid endarterectomy 03/12/2018   Hypertension 03/01/2018   Atherosclerosis of native arteries of extremity with intermittent claudication (HCC) 03/01/2018   Atherosclerotic peripheral vascular disease with intermittent claudication (HCC) 01/03/2018   Benign essential HTN 12/07/2017   Bilateral carotid artery stenosis 12/07/2017   SOBOE (shortness of breath on exertion) 12/07/2017   Acute cholecystitis 07/24/2016   Coronary artery disease involving coronary bypass graft of native heart without angina pectoris    Tobacco abuse     Orientation RESPIRATION BLADDER Height & Weight     Self, Time, Situation, Place  O2 (Nasal Cannula 2 L.) Continent Weight: 230 lb 6.1 oz (104.5 kg) Height:  6' (182.9 cm)  BEHAVIORAL SYMPTOMS/MOOD NEUROLOGICAL BOWEL NUTRITION STATUS   (None)  (None) Continent Diet (2 gram  sodium)  AMBULATORY STATUS COMMUNICATION OF NEEDS Skin   Supervision Verbally Bruising, Other (Comment) (Scratch marks.)                       Personal Care Assistance Level of Assistance  Bathing,  Feeding, Dressing Bathing Assistance: Maximum assistance Feeding assistance: Limited assistance Dressing Assistance: Maximum assistance     Functional Limitations Info  Sight, Hearing, Speech Sight Info: Adequate Hearing Info: Adequate Speech Info: Adequate    SPECIAL CARE FACTORS FREQUENCY  PT (By licensed PT), OT (By licensed OT)     PT Frequency: 5 x week OT Frequency: 5 x week            Contractures Contractures Info: Not present    Additional Factors Info  Code Status, Allergies Code Status Info: Full code Allergies Info: Wellbutrin  (Bupropion ).           Current Medications (08/20/2024):  This is the current hospital active medication list Current Facility-Administered Medications  Medication Dose Route Frequency Provider Last Rate Last Admin   amiodarone  (PACERONE ) tablet 200 mg  200 mg Oral Daily Amin, Sumayya, MD   200 mg at 08/20/24 9061   ascorbic acid  (VITAMIN C ) tablet 500 mg  500 mg Oral Daily Amin, Sumayya, MD   500 mg at 08/20/24 9060   aspirin  EC tablet 81 mg  81 mg Oral Daily Decoste, Gabriella, PA-C   81 mg at 08/20/24 9060   budesonide  (PULMICORT ) 180 MCG/ACT inhaler 1 puff  1 puff Inhalation BID Amin, Sumayya, MD   1 puff at 08/20/24 0854   clopidogrel  (PLAVIX ) tablet 75 mg  75 mg Oral Daily Decoste, Gabriella, PA-C   75 mg at 08/20/24 9061   dapagliflozin  propanediol (FARXIGA ) tablet 10 mg  10 mg Oral Daily Decoste, Gabriella, PA-C   10 mg at 08/20/24 9060   dextromethorphan -guaiFENesin  (MUCINEX  DM) 30-600 MG per 12 hr tablet 1 tablet  1 tablet Oral BID PRN Nelson, Dana G, NP   1 tablet at 08/18/24 2127   docusate sodium  (COLACE) capsule 100 mg  100 mg Oral BID PRN Nelson, Dana G, NP       Fe Fum-Vit C-Vit B12-FA (TRIGELS-F FORTE) capsule 1 capsule  1 capsule Oral BID Amin, Sumayya, MD   1 capsule at 08/20/24 9060   gabapentin  (NEURONTIN ) capsule 300 mg  300 mg Oral BID Amin, Sumayya, MD   300 mg at 08/20/24 9061   hydrOXYzine  (ATARAX ) tablet 10 mg   10 mg Oral TID PRN Amin, Sumayya, MD   10 mg at 08/19/24 1521   insulin  aspart (novoLOG ) injection 0-5 Units  0-5 Units Subcutaneous QHS Wieting, Richard, MD       insulin  aspart (novoLOG ) injection 0-9 Units  0-9 Units Subcutaneous TID WC Josette Ade, MD   2 Units at 08/20/24 0857   Ipratropium-Albuterol  (COMBIVENT ) respimat 1 puff  1 puff Inhalation Q6H PRN Amin, Sumayya, MD   1 puff at 08/20/24 0854   isosorbide  mononitrate (IMDUR ) 24 hr tablet 30 mg  30 mg Oral Daily Decoste, Gabriella, PA-C   30 mg at 08/20/24 9061   lactulose  (CHRONULAC ) 10 GM/15ML solution 30 g  30 g Oral BID PRN Amin, Sumayya, MD   30 g at 08/15/24 1520   losartan  (COZAAR ) tablet 25 mg  25 mg Oral Daily Decoste, Gabriella, PA-C   25 mg at 08/20/24 0939   melatonin tablet 5 mg  5 mg Oral QHS PRN Nazari, Walid A, RPH  5 mg at 08/18/24 2125   metoprolol  succinate (TOPROL -XL) 24 hr tablet 25 mg  25 mg Oral Daily Decoste, Gabriella, PA-C   25 mg at 08/20/24 9061   multivitamin with minerals tablet 1 tablet  1 tablet Oral Daily Nelson, Dana G, NP   1 tablet at 08/20/24 0939   mupirocin  ointment (BACTROBAN ) 2 %   Nasal BID Amin, Sumayya, MD   Given at 08/20/24 [phone]   nicotine  (NICODERM CQ  - dosed in mg/24 hr) patch 7 mg  7 mg Transdermal Daily Nelson, Dana G, NP   7 mg at 08/20/24 9056   Oral care mouth rinse  15 mL Mouth Rinse PRN Mansy, Jan A, MD       pantoprazole  (PROTONIX ) injection 40 mg  40 mg Intravenous Q12H Nelson, Dana G, NP   40 mg at 08/20/24 0935   polyethylene glycol (MIRALAX  / GLYCOLAX ) packet 17 g  17 g Oral BID Amin, Sumayya, MD   17 g at 08/20/24 9060   predniSONE  (DELTASONE ) tablet 20 mg  20 mg Oral Q breakfast Josette Ade, MD   20 mg at 08/20/24 0856   rosuvastatin  (CRESTOR ) tablet 20 mg  20 mg Oral Daily Amin, Sumayya, MD   20 mg at 08/20/24 9061   thiamine  (VITAMIN B1) tablet 100 mg  100 mg Oral Daily Nelson, Dana G, NP   100 mg at 08/20/24 9061   torsemide  (DEMADEX ) tablet 20 mg  20 mg Oral Daily  Decoste, Gabriella, PA-C   20 mg at 08/20/24 9061   Vitamin D  (Ergocalciferol ) (DRISDOL ) 1.25 MG (50000 UNIT) capsule 50,000 Units  50,000 Units Oral Q7 days Amin, Sumayya, MD   50,000 Units at 08/13/24 1214     Discharge Medications: Please see discharge summary for a list of discharge medications.  Relevant Imaging Results:  Relevant Lab Results:   Additional Information SS#: [id number]  [name], LCSW

## 2024-08-20 NOTE — TOC Progression Note (Addendum)
 Transition of Care [facility]) - Progression Note    Patient Details  Name: [name] MRN: [id number] Date of Birth: [birth year]  [facility] CM/SW Contact  [name], LCSW Phone Number: 08/20/2024, 1:05 PM  Clinical Narrative:   CSW started insurance authorization for return to [facility].  3:26 pm: Sh[place] still pending.    Barriers to Discharge: Continued Medical Work up               Expected Discharge Plan and Services       Living arrangements for the past 2 months: Skilled Nursing Facility, Single Family Home Expected Discharge Date: 08/19/24                                     Social Drivers of Health (SDOH) Interventions SDOH Screenings   Food Insecurity: No Food Insecurity (08/13/2024)  Housing: Low Risk  (08/13/2024)  Transportation Needs: No Transportation Needs (08/13/2024)  Utilities: Not At Risk (08/13/2024)  Social Connections: Socially Isolated (08/13/2024)  Tobacco Use: High Risk (08/12/2024)    Readmission Risk Interventions    08/13/2024   12:01 PM 05/14/2024    8:44 AM 10/27/2022   11:28 AM  Readmission Risk Prevention Plan  Transportation Screening Complete  Complete  PCP or Specialist Appt within 5-7 Days   Complete  PCP or Specialist Appt within 3-5 Days  Complete   Home Care Screening   Complete  Medication Review (RN CM)   Complete  HRI or Home Care Consult  Complete   Social Work Consult for Recovery Care Planning/Counseling  Complete   Palliative Care Screening  Not Applicable   Medication Review Oceanographer) Complete Complete   Palliative Care Screening Not Applicable    Skilled Nursing Facility Complete

## 2024-08-20 NOTE — Progress Notes (Signed)
 Physical Therapy Treatment Patient Details Name: [name] MRN: [id number] DOB: 10-12-1961 Today's Date: 08/20/2024   History of Present Illness 63 y.o. male with medical history significant of  GIB, alcohol  abuse, COPD on 2 L oxygen, hypertension, CAD with prior CABG and recent Stent placement, HLD, stroke, dementia, dCHF, presents with SOB, cough, chest pain.     Patient was recently hospitalized from 7/25 - 8/14 and d/c'd to rehab facility.  Returns after syncopal epsisode with weakness and GI bleed - recieved blood, found to be Covid +    PT Comments  Patient sidelying in bed on arrival and agreeable to PT treatment session. Required supervision for bed mobility and sit to stand this date. Ambulated 80' x 2 in room with RW and supervision on 2L for first bout and 3L for second bout (per patient request). Required extended seated rest break to recover although spO2 >90% throughout. Limited by fatigue but overall mobilizing well. Discharge plan remains appropriate.    If plan is discharge home, recommend the following: A little help with walking and/or transfers;A lot of help with bathing/dressing/bathroom;Assistance with cooking/housework;Assist for transportation;Help with stairs or ramp for entrance   Can travel by private vehicle     Yes  Equipment Recommendations  Rolling Tatym Schermer (2 wheels)    Recommendations for Other Services       Precautions / Restrictions Precautions Precautions: Fall Recall of Precautions/Restrictions: Intact Restrictions Weight Bearing Restrictions Per Provider Order: No     Mobility  Bed Mobility Overal bed mobility: Needs Assistance Bed Mobility: Supine to Sit, Sit to Supine     Supine to sit: Supervision Sit to supine: Supervision        Transfers Overall transfer level: Needs assistance Equipment used: Rolling Hazeline Charnley (2 wheels) Transfers: Sit to/from Stand Sit to Stand: Supervision                 Ambulation/Gait Ambulation/Gait assistance: Supervision Gait Distance (Feet): 80 Feet (+80') Assistive device: Rolling [place] Lasseter (2 wheels) Gait Pattern/deviations: Step-through pattern, Decreased step length - right, Decreased step length - left Gait velocity: decreased     General Gait Details: completes 2x80' in room. Requires seated rest b/t sets due to fatigue and SOB. SPO2 > 90% on 2-3 L/min   Stairs             Wheelchair Mobility     Tilt Bed    Modified Rankin (Stroke Patients Only)       Balance Overall balance assessment: Mild deficits observed, not formally tested                                          Communication Communication Communication: Impaired Factors Affecting Communication: Hearing impaired  Cognition Arousal: Alert Behavior During Therapy: WFL for tasks assessed/performed   PT - Cognitive impairments: No apparent impairments                         Following commands: Intact      Cueing    Exercises      General Comments        Pertinent Vitals/Pain Pain Assessment Pain Assessment: No/denies pain    Home Living                          Prior Function  PT Goals (current goals can now be found in the care plan section) Acute Rehab PT Goals Patient Stated Goal: to get stronger PT Goal Formulation: With patient Time For Goal Achievement: 08/26/24 Potential to Achieve Goals: Good Progress towards PT goals: Progressing toward goals    Frequency    Min 2X/week      PT Plan      Co-evaluation              AM-PAC PT 6 Clicks Mobility   Outcome Measure  Help needed turning from your back to your side while in a flat bed without using bedrails?: A Little Help needed moving from lying on your back to sitting on the side of a flat bed without using bedrails?: A Little Help needed moving to and from a bed to a chair (including a wheelchair)?: A Little Help  needed standing up from a chair using your arms (e.g., wheelchair or bedside chair)?: A Little Help needed to walk in hospital room?: A Little Help needed climbing 3-5 steps with a railing? : A Lot 6 Click Score: 17    End of Session Equipment Utilized During Treatment: Oxygen Activity Tolerance: Patient limited by fatigue;Patient tolerated treatment well Patient left: in bed;with call bell/phone within reach Nurse Communication: Mobility status PT Visit Diagnosis: Muscle weakness (generalized) (M62.81);Difficulty in walking, not elsewhere classified (R26.2);Unsteadiness on feet (R26.81)     Time: 8563-8485 PT Time Calculation (min) (ACUTE ONLY): 38 min  Charges:    $Therapeutic Activity: 23-37 mins PT General Charges $$ ACUTE PT VISIT: 1 Visit                     Maryanne Finder, PT, DPT Physical Therapist - [facility]  [facility]    Dixie Jafri A Khoury Siemon 08/20/2024, 3:55 PM

## 2024-08-20 NOTE — Discharge Instructions (Signed)

## 2024-08-21 ENCOUNTER — Inpatient Hospital Stay

## 2024-08-21 DIAGNOSIS — I214 Non-ST elevation (NSTEMI) myocardial infarction: Secondary | ICD-10-CM | POA: Diagnosis not present

## 2024-08-21 DIAGNOSIS — I350 Nonrheumatic aortic (valve) stenosis: Secondary | ICD-10-CM | POA: Diagnosis not present

## 2024-08-21 LAB — BASIC METABOLIC PANEL WITH GFR
Anion gap: 9 (ref 5–15)
BUN: 30 mg/dL — ABNORMAL HIGH (ref 8–23)
CO2: 31 mmol/L (ref 22–32)
Calcium: 8.2 mg/dL — ABNORMAL LOW (ref 8.9–10.3)
Chloride: 98 mmol/L (ref 98–111)
Creatinine, Ser: 1.12 mg/dL (ref 0.61–1.24)
GFR, Estimated: >60 mL/min (ref >60)
Glucose, Bld: 120 mg/dL — ABNORMAL HIGH (ref 70–99)
Potassium: 4.2 mmol/L (ref 3.5–5.1)
Sodium: 138 mmol/L (ref 135–145)

## 2024-08-21 LAB — GLUCOSE, CAPILLARY
Glucose-Capillary: 137 mg/dL — ABNORMAL HIGH (ref 70–99)
Glucose-Capillary: 112 mg/dL — ABNORMAL HIGH (ref 70–99)
Glucose-Capillary: 96 mg/dL (ref 70–99)
Glucose-Capillary: 219 mg/dL — ABNORMAL HIGH (ref 70–99)
Glucose-Capillary: 212 mg/dL — ABNORMAL HIGH (ref 70–99)

## 2024-08-21 LAB — HEMOGLOBIN: Hemoglobin: 9.8 g/dL — ABNORMAL LOW (ref 13.0–17.0)

## 2024-08-21 MED ORDER — PREDNISONE 10 MG PO TABS
10.0000 mg | ORAL_TABLET | Freq: Every day | ORAL | Status: DC
  Administered 2024-08-22: 10 mg via ORAL
  Filled 2024-08-21: qty 1

## 2024-08-21 MED ORDER — ISOSORBIDE MONONITRATE ER 30 MG PO TB24
30.0000 mg | ORAL_TABLET | Freq: Every day | ORAL | Status: DC
  Administered 2024-08-22: 30 mg via ORAL
  Filled 2024-08-21: qty 1

## 2024-08-21 MED ORDER — IOHEXOL 350 MG/ML SOLN
100.0000 mL | Freq: Once | INTRAVENOUS | Status: AC | PRN
  Administered 2024-08-21: 100 mL via INTRAVENOUS

## 2024-08-21 MED ORDER — LOSARTAN POTASSIUM 25 MG PO TABS
25.0000 mg | ORAL_TABLET | Freq: Every day | ORAL | Status: DC
  Administered 2024-08-22: 25 mg via ORAL
  Filled 2024-08-21: qty 1

## 2024-08-21 NOTE — Progress Notes (Signed)
 Nutrition Follow-up  DOCUMENTATION CODES:   Not applicable  INTERVENTION:   -Continue Magic cup TID with meals, each supplement provides 290 kcal and 9 grams of protein  -Continue MVI with minerals daily -Continue 1 mg folic acid  daily -Continue 100 mg thiamine  daily -Continue 2 gram sodium diet  NUTRITION DIAGNOSIS:   Increased nutrient needs related to catabolic illness (COVID 19) as evidenced by estimated needs.  Ongoing  GOAL:   Patient will meet greater than or equal to 90% of their needs  Progressing   MONITOR:   PO intake, Supplement acceptance, Labs, Weight trends, I & O's, Skin  REASON FOR ASSESSMENT:   Malnutrition Screening Tool    ASSESSMENT:   63 y/o male with h/o HLD, etoh abuse, HTN, CAD s/p CABG x 3 (2013), severe aortic stenosis, CHF, CVA, IDA, COPD, MI, GERD, hiatal hernia, CKD III, intestional AVM, depression, left heart catheterization s/p left heart cath and stent placement 7/16 and recent admission for NSTEMI, CHF/COPD exacerbation, suspected aspiration and etoh withdrawal and who is now admitted with GIB and COVID 19.  Reviewed I/O's: -2.9 L x 24 hours and -4.7 L since admission  UOP: 3.2 L x 24 hours   Plan for CT TAVR imaging today.   Pt remains with good appetite. Noted meal completions 40-100%.   Noted increased wt gain, likely related to edema. Pt on diuretics.   Per TOC notes, plan to d/c back to SNF at d/c.   Medications reviewed and inclde vitamin C , plavix , farxiga , neurontin , protonix , miralax , prednisone , thiamine , vitamin D , and demadex .   Labs reviewed: CBGS: 96-200 (inpatient orders for glycemic control are 0-5 units insulin  aspart daily at bedtime and 0-9 units insulin  aspart TID).    Diet Order:   Diet Order             Diet 2 gram sodium Room service appropriate? Yes; Fluid consistency: Thin  Diet effective now                   EDUCATION NEEDS:   Education needs have been addressed  Skin:  Skin  Assessment: Reviewed RN Assessment  Last BM:  08/21/24  Height:   Ht Readings from Last 1 Encounters:  08/12/24 6' (1.829 m)    Weight:   Wt Readings from Last 1 Encounters:  08/21/24 104.7 kg    Ideal Body Weight:  80.9 kg  BMI:  Body mass index is 31.31 kg/m.  Estimated Nutritional Needs:   Kcal:  2300-2600kcal/day  Protein:  115-130g/day  Fluid:  2.0L/day    Margery ORN, RD, LDN, CDCES Registered Dietitian III Certified Diabetes Care and Education Specialist If unable to reach this RD, please use RD Inpatient group chat on secure chat between hours of 8am-4 pm daily

## 2024-08-21 NOTE — Progress Notes (Signed)
 Progress Note   Patient: [name] FMW:[id number] DOB: 01/14/1961 DOA: 08/12/2024     9 DOS: the patient was seen and examined on 08/21/2024   Brief hospital course: [name] is a 63 y.o. male PMH multiple medical comorbidities including COPD on 2 L nasal cannula, CAD with prior CABG and recent stent placement, HLD, stroke, dementia, diastolic CHF, prior GI bleed not on anticoagulation (was on dual antiplatelet therapy) presents for evaluation of reported syncopal episode, apparently patient has a bloody bowel movement.   Patient was initially admitted in ICU for concern of hemorrhagic shock secondary to acute GI bleed. S/p multiple units pRBC and platelet transfusion and IV Venofer , Seen by GI. Found to be COVID positive, hypoxia improved.  Seen by Cardiology Dr.Pride (valve specialist) plan to follow up outpatient.   TOC working on placement, SNF denied. Plan to discharge tomorrow with home health and transportation needs   Assessment and Plan: GI bleed Acute blood loss anemia Patient with history of recurrent GI bleed secondary to small bowel AVMs.   Patient received 5 units of packed red blood cells during the hospital course - Aspirin  and Plavix  restarted on 8/29 - Received IV iron  - Bleedings with AVMs may improve after valve surgery - Monitor Hb, 9.4 today   Hemorrhagic shock (HCC) Initial concern of hemorrhagic shock with significant hypotension so he was admitted in ICU. Levophed  was ordered but never given. This has resolved   Acute on chronic diastolic CHF (congestive heart failure) (HCC) CXR with mild pulmonary vascular congestion, mild lower extremity edema. Resume home torsemide  20 mg, dapagliflozin  10mg  daily  Severe aortic stenosis Echo 05/25 revealed severe AS.  Aortic valve peak gradient 66.3 mmHg Seen by Dr. Katina, discussed aortic valve repair/replacement CT TAVR imaging done 9/04, images being sent to [facility] by cardiology. Follow-up with  Dr.Paraschos on 09/11 at 11:15 AM.   COPD with acute exacerbation (HCC) COVID-19 virus infection Prednisone  taper   History of CAD (coronary artery disease) s/p CABG (2013), stent (7/25) Patient with history of NSTEMI s/p PCI in July 2025 Troponin elevated, likely secondary to demand with GI bleed. Initially aspirin  and Plavix  was held but restarted. Continue Crestor , losartan , metoprolol , Imdur   Hx ventricular ectopy Continue home amiodarone , metoprolol    Acute on chronic hypoxic respiratory failure - resolved Chronically on 2 L   Obesity (BMI 30-39.9) Class I with a BMI of 30.95   CKD stage 3a, GFR 45-59 ml/min (HCC) Creatinine 1.42 with a GFR 55   Subjective: Patient feels better.  Offers no complaints.  No further bleeding Plan to discharge tomorrow with home health  Physical Exam: Vitals:   08/21/24 0500 08/21/24 0843 08/21/24 1106 08/21/24 1618  BP:  118/79 116/82 110/64  Pulse:  66 77 78  Resp:  18 18 18   Temp:  97.8 F (36.6 C)  98.6 F (37 C)  TempSrc:  Oral    SpO2:  100% 100% 100%  Weight: 104.7 kg     Height:       General: Chronically ill-appearing elderly male, well nourished, in no acute distress. HEENT: Normocephalic and atraumatic. Neck: No JVD.   Lungs: Normal respiratory effort on 2L [place]. Clear bilaterally to auscultation, diminished bilaterally. Heart: HRRR. Normal S1 and S2, + systolic murmur.  Abdomen: Non-distended appearing.  Msk: Normal strength and tone for age. Extremities: Warm and well perfused. No clubbing, cyanosis, 1+ pedal edema.  Neuro: Alert and oriented X 3. Psych: Answers questions appropriately  Labs reviewed  Family Communication: Discussed with patient at the bedside  Disposition: Status is: Inpatient Remains inpatient appropriate because: Unfortunately unable to get home since his landlord had changed the locks and friend unable to bring home.   SNF declined by [employer].  Anticipate discharge tomorrow once we have a  plan    Time spent: 35 minutes  Author: Laree Lock, MD 08/21/2024 5:04 PM  For on call review [email or web address].

## 2024-08-21 NOTE — Progress Notes (Signed)
 Heart Failure Navigator Progress Note  Assessed for Heart & Vascular TOC clinic readiness.  Patient does not meet criteria due to current [facility] patient.   Navigator will sign off at this time.  Charmaine Pines, RN, BSN [facility] Heart Failure Navigator Secure Chat Only

## 2024-08-21 NOTE — Plan of Care (Signed)

## 2024-08-21 NOTE — Progress Notes (Signed)
 Occupational Therapy Treatment Patient Details Name: [name] MRN: [id number] DOB: [birth year] Today's Date: 08/21/2024   History of present illness 63 y.o. male with medical history significant of  GIB, alcohol  abuse, COPD on 2 L oxygen, hypertension, CAD with prior CABG and recent Stent placement, HLD, stroke, dementia, dCHF, presents with SOB, cough, chest pain.     Patient was recently hospitalized from 7/25 - 8/14 and d/c'd to rehab facility.  Returns after syncopal epsisode with weakness and GI bleed - recieved blood, found to be Covid +   OT comments  Pt seen for OT treatment on this date. Upon arrival to room pt supine in bed, agreeable to tx. Pt engaged in functional transfer training with focus on safe hand placement, engaged in standing attempts at basic grooming tasks however due to extensive fatigue required extended seated recovery periods (vitals monitored with HR: 86-88 and O2 on 2L 100%).  Education provided on energy conservation techniques during ADLs due to level of fatigue when attempting very light ADLs with goal of building activity tolerance over time. Patient trained on breathing techniques and positioning to support recovery periods due to report of feeling SOB, vitals monitored WFL throughout activity engagement. Pt making good progress toward goals, will continue to follow POC. Discharge recommendation remains appropriate.        If plan is discharge home, recommend the following:  A lot of help with bathing/dressing/bathroom;Assistance with cooking/housework;Direct supervision/assist for medications management;Assist for transportation;A little help with walking and/or transfers   Equipment Recommendations  BSC/3in1    Recommendations for Other Services      Precautions / Restrictions Precautions Precautions: Fall Recall of Precautions/Restrictions: Intact Restrictions Weight Bearing Restrictions Per Provider Order: No       Mobility Bed  Mobility Overal bed mobility: Needs Assistance Bed Mobility: Supine to Sit     Supine to sit: Supervision          Transfers Overall transfer level: Needs assistance Equipment used: Rolling walker (2 wheels) Transfers: Sit to/from Stand Sit to Stand: Supervision     Step pivot transfers: Contact guard assist     General transfer comment: Verbal cuing for safety with hand placement     Balance Overall balance assessment: Needs assistance Sitting-balance support: Feet supported Sitting balance-Leahy Scale: Good     Standing balance support: During functional activity, Bilateral upper extremity supported Standing balance-Leahy Scale: Fair Standing balance comment: static standing at RW for +2 minutes however when introducing ADL engagement in standing fatigues quickly requiring seated recovery period.                           ADL either performed or assessed with clinical judgement   ADL       Grooming: Set up;Wash/dry face;Sitting Grooming Details (indicate cue type and reason): Attempted to completed in standing however requested to complete seated due to significant fatigue.         Upper Body Dressing : Minimal assistance;Sitting       Toilet Transfer: Supervision/safety;Contact guard assist;Rolling walker (2 wheels);Ambulation Toilet Transfer Details (indicate cue type and reason): simulated         Functional mobility during ADLs: Supervision/safety;Contact guard assist;Rolling walker (2 wheels) General ADL Comments: CGA for functional mobility at RW level, fatigues quickly in standing with attempting to complete ADLs    Extremity/Trunk Assessment Upper Extremity Assessment Upper Extremity Assessment: Generalized weakness  Vision Patient Visual Report: No change from baseline     Perception     Praxis     Communication Communication Communication: Impaired Factors Affecting Communication: Hearing impaired    Cognition Arousal: Alert Behavior During Therapy: WFL for tasks assessed/performed Cognition: Cognition impaired             OT - Cognition Comments: A/Ox3, Slow processing                 Following commands: Intact Following commands impaired: Only follows one step commands consistently, Follows multi-step commands inconsistently      Cueing   Cueing Techniques: Verbal cues  Exercises Other Exercises Other Exercises: Education on hand placement with transfers, purse lip breathing techniques, energy conservation techniques, and positioning to support effective breathing    Shoulder Instructions       General Comments      Pertinent Vitals/ Pain       Pain Assessment Pain Assessment: No/denies pain  Home Living                                          Prior Functioning/Environment              Frequency  Min 2X/week        Progress Toward Goals  OT Goals(current goals can now be found in the care plan section)  Progress towards OT goals: Progressing toward goals  Acute Rehab OT Goals Potential to Achieve Goals: Good  Plan      Co-evaluation                 AM-PAC OT 6 Clicks Daily Activity     Outcome Measure   Help from another person eating meals?: None Help from another person taking care of personal grooming?: None Help from another person toileting, which includes using toliet, bedpan, or urinal?: A Little Help from another person bathing (including washing, rinsing, drying)?: A Little Help from another person to put on and taking off regular upper body clothing?: None Help from another person to put on and taking off regular lower body clothing?: A Lot 6 Click Score: 20    End of Session Equipment Utilized During Treatment: Rolling walker (2 wheels);Oxygen  OT Visit Diagnosis: Muscle weakness (generalized) (M62.81);History of falling (Z91.81)   Activity Tolerance Patient tolerated treatment well    Patient Left in chair;with call bell/phone within reach;with chair alarm set   Nurse Communication Mobility status        Time: 8747-8668 OT Time Calculation (min): 39 min  Charges: OT General Charges $OT Visit: 1 Visit OT Treatments $Self Care/Home Management : 8-22 mins $Therapeutic Activity: 23-37 mins  Harlene Sharps OTR/L   Harlene LITTIE Sharps 08/21/2024, 3:03 PM

## 2024-08-21 NOTE — Progress Notes (Addendum)
 [facility] CLINIC CARDIOLOGY PROGRESS NOTE       Patient ID: [name] MRN: [id number] DOB/AGE: [birth year] 63 y.o.  Admit date: 08/12/2024 Referring Physician Dr. Caleen Primary Physician Osa Geralds, NP Primary Cardiologist Tinnie Maiden, NP/ Dr. Ammon / Dr. Katina Reason for Consultation elevated trop, severe AS, complicated cardiac hx  HPI: [name] is a 63 y.o. 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 Of note, patient recently hospitalized 07/26 - 08/14 for ETOH withdrawal requiring mechanical intubation with PNA due to recurrent aspiration with AoC CHF. Patient was discharged to [facility]. During this admission, Hgb found to be 4.7. Troponin found to be elevated, patient denies any chest pain.  Cardiology was consulted for further evaluation.   Interval History: -Patient seen and examined this AM and laying comfortably in hospital bed, nearly flat. Patient states he he feels okay today. Reports SOB feels the same this AM. Continues to deny any chest pain/pressure, palpitations or lightheadedness. -Appears slightly volume up with + pedal edema, improving -Patients BP and HR stable this AM. Overnight Tele showed no significant events.  -Patient remains on 2L [place] (at baseline).  -Hemoglobin remains stable this a.m. -Plan for CT TAVR imaging today.  Patient has remained NPO.  Pertinent Cardiac History (Most recent) LHC 07/02/24   Prox LAD to Mid LAD lesion is 90% stenosed.   Ost LM to Mid LM lesion is 50% stenosed.   Ost Cx to Prox Cx lesion is 90%  stenosed.   Ost RCA to Prox RCA lesion is 100% stenosed.   Mid Graft to Dist Graft lesion is 95% stenosed.   Recommend uninterrupted dual antiplatelet therapy with Aspirin  81mg  daily and Clopidogrel  75mg  daily for a minimum of 6 months (stable ischemic heart disease-Class I recommendation).   1.  Severe native three-vessel CAD 2.  LIMA to LAD and SVG to OM widely patent 3.  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  Successful direct stenting of the distal SVG to PDA with distal protection with a 4.0 x 15 mm drug-eluting stent with intravascular ultrasound guidance 5.  Aspirin  and clopidogrel  for at least 6 months followed by clopidogrel  indefinitely 6.  Continue workup for aortic valve replacement    Review of systems complete and found to be negative unless listed above    Past Medical History:  Diagnosis Date   COPD (chronic obstructive pulmonary disease) (HCC)    CVA (cerebral infarction)    Headache    mild, since stroke 2012   Hypertension    MI (myocardial infarction) (HCC) 05/09/2012   Reading difficulty    pt reports he reads at about a second grade level   Stroke [facility]) 2012   numbness to left hand    Wears dentures    full upper and lower    Past Surgical History:  Procedure Laterality Date   CAROTID ENDARTERECTOMY Left    2012 or 2013   CHOLECYSTECTOMY N/A 07/26/2016   Procedure: LAPAROSCOPIC CHOLECYSTECTOMY;  Surgeon: Charlie FORBES Fell, MD;  Location: [facility] ORS;  Service: General;  Laterality:  N/A;   COLONOSCOPY N/A 05/08/2024   Procedure: COLONOSCOPY;  Surgeon: Jinny Carmine, MD;  Location: [facility] ENDOSCOPY;  Service: Endoscopy;  Laterality: N/A;   COLONOSCOPY WITH PROPOFOL  N/A 02/09/2022   Procedure: COLONOSCOPY WITH PROPOFOL ;  Surgeon: Jinny Carmine, MD;  Location: [facility] ENDOSCOPY;  Service: Endoscopy;  Laterality: N/A;   CORONARY ARTERY BYPASS GRAFT  05/10/2012   3 vessel   CORONARY STENT INTERVENTION N/A 07/02/2024   Procedure: CORONARY STENT  INTERVENTION;  Surgeon: Katina Albright, MD;  Location: [facility] INVASIVE CV LAB;  Service: Cardiovascular;  Laterality: N/A;   CORONARY ULTRASOUND/IVUS N/A 07/02/2024   Procedure: Coronary Ultrasound/IVUS;  Surgeon: Katina Albright, MD;  Location: [facility] INVASIVE CV LAB;  Service: Cardiovascular;  Laterality: N/A;   ENTEROSCOPY N/A 05/16/2023   Procedure: ENTEROSCOPY;  Surgeon: Therisa Bi, MD;  Location: [facility] ENDOSCOPY;  Service: Gastroenterology;  Laterality: N/A;   ENTEROSCOPY N/A 06/01/2023   Procedure: ENTEROSCOPY;  Surgeon: Unk Corinn Skiff, MD;  Location: [facility] ENDOSCOPY;  Service: Gastroenterology;  Laterality: N/A;   ESOPHAGOGASTRODUODENOSCOPY  05/13/2023   Procedure: ESOPHAGOGASTRODUODENOSCOPY (EGD);  Surgeon: Jinny Carmine, MD;  Location: [facility] ENDOSCOPY;  Service: Endoscopy;;   ESOPHAGOGASTRODUODENOSCOPY (EGD) WITH PROPOFOL  N/A 10/28/2022   Procedure: ESOPHAGOGASTRODUODENOSCOPY (EGD) WITH PROPOFOL ;  Surgeon: Onita Elspeth Sharper, DO;  Location: [facility] ENDOSCOPY;  Service: Gastroenterology;  Laterality: N/A;   GIVENS CAPSULE STUDY  05/13/2023   Procedure: GIVENS CAPSULE STUDY;  Surgeon: Jinny Carmine, MD;  Location: [facility] ENDOSCOPY;  Service: Endoscopy;;   GIVENS CAPSULE STUDY  06/01/2023   Procedure: GIVENS CAPSULE STUDY;  Surgeon: Unk Corinn Skiff, MD;  Location: [facility] ENDOSCOPY;  Service: Gastroenterology;;   LEFT HEART CATH AND CORS/GRAFTS ANGIOGRAPHY N/A 07/02/2024   Procedure: LEFT HEART CATH AND CORS/GRAFTS ANGIOGRAPHY;  Surgeon: Katina Albright, MD;  Location: [facility] INVASIVE CV LAB;  Service: Cardiovascular;  Laterality: N/A;    Medications Prior to Admission  Medication Sig Dispense Refill Last Dose/Taking   budesonide  (PULMICORT ) 0.5 MG/2ML nebulizer solution Take 2 mLs (0.5 mg total) by nebulization 2 (two) times daily.   08/12/2024 at  8:13 AM   folic acid  (FOLVITE ) 1 MG tablet Take 1 tablet (1 mg total) by mouth daily. 30 tablet 2 08/12/2024 at  8:13 AM   Glycerin , Laxative, (GLYCERIN , ADULT,) 2 g  SUPP Place 1 suppository rectally daily as needed for moderate constipation.   Unknown   HYDROcodone -acetaminophen  (NORCO/VICODIN) 5-325 MG tablet Take 1 tablet by mouth every 6 (six) hours as needed for moderate pain (pain score 4-6).   Unknown   ipratropium-albuterol  (DUONEB) 0.5-2.5 (3) MG/3ML SOLN Take 3 mLs by nebulization 3 (three) times daily.   08/12/2024 at 11:44 AM   iron  polysaccharides (NIFEREX) 150 MG capsule Take 1 capsule (150 mg total) by mouth daily. 30 capsule 2 08/12/2024 at  8:13 AM   lidocaine  4 % Place 1 patch onto the skin daily.   08/12/2024 at  8:13 AM   senna-docusate (SENOKOT-S) 8.6-50 MG tablet Take 2 tablets by mouth at bedtime.   08/11/2024 at  9:22 AM   sodium chloride  (OCEAN) 0.65 % SOLN nasal spray Place 1 spray into both nostrils as needed for congestion.   Unknown   torsemide  (DEMADEX ) 20 MG tablet Take 1 tablet (20 mg total) by mouth daily.   08/12/2024 at  8:13 AM   traZODone  (DESYREL ) 50 MG tablet Take 50 mg by mouth at bedtime.   08/11/2024 at  9:22 PM   [DISCONTINUED] albuterol  (VENTOLIN  HFA) 108 (90 Base) MCG/ACT inhaler Inhale 2 puffs into the  lungs every 6 (six) hours as needed for wheezing or shortness of breath. 6.7 g 2 08/12/2024 at 12:56 AM   [DISCONTINUED] amiodarone  (PACERONE ) 200 MG tablet Take 1 tablet (200 mg total) by mouth daily.   08/12/2024 at  8:13 AM   [DISCONTINUED] ascorbic acid  (VITAMIN C ) 500 MG tablet Take 1 tablet (500 mg total) by mouth daily. 30 tablet 2 08/12/2024 at  8:13 AM   [DISCONTINUED] aspirin  EC 81 MG tablet Take 1 tablet (81 mg total) by mouth daily. Swallow whole. 30 tablet 5 08/12/2024 at  8:13 AM   [DISCONTINUED] clopidogrel  (PLAVIX ) 75 MG tablet Take 1 tablet (75 mg total) by mouth daily with breakfast. 30 tablet 11 08/12/2024 at  5:35 AM   [DISCONTINUED] dapagliflozin  propanediol (FARXIGA ) 10 MG TABS tablet Take 1 tablet (10 mg total) by mouth daily. 30 tablet 11 08/12/2024 at  8:13 AM   [DISCONTINUED] dextromethorphan -guaiFENesin   (MUCINEX  DM) 30-600 MG 12hr tablet Take 1 tablet by mouth 2 (two) times daily as needed for cough.   Unknown   [DISCONTINUED] lactulose  (CHRONULAC ) 10 GM/15ML solution Take 30 mLs (20 g total) by mouth daily as needed for moderate constipation or mild constipation.   08/10/2024   [DISCONTINUED] melatonin 5 MG TABS Place 1 tablet (5 mg total) into feeding tube at bedtime as needed.   Unknown   [DISCONTINUED] Multiple Vitamin (MULTIVITAMIN WITH MINERALS) TABS tablet Take 1 tablet by mouth daily.   08/12/2024 at  8:13 AM   [DISCONTINUED] nicotine  (NICODERM CQ  - DOSED IN MG/24 HR) 7 mg/24hr patch Place 1 patch (7 mg total) onto the skin daily.   08/12/2024 at  8:13 AM   [DISCONTINUED] polyethylene glycol (MIRALAX  / GLYCOLAX ) 17 g packet Take 17 g by mouth at bedtime.   08/10/2024   [DISCONTINUED] rosuvastatin  (CRESTOR ) 20 MG tablet Take 1 tablet (20 mg total) by mouth daily.   08/11/2024 at  9:22 PM   [DISCONTINUED] thiamine  (VITAMIN B-1) 100 MG tablet Take 1 tablet (100 mg total) by mouth daily.   08/12/2024 at  8:13 AM   [DISCONTINUED] Vitamin D , Ergocalciferol , (DRISDOL ) 1.25 MG (50000 UNIT) CAPS capsule Take 1 capsule (50,000 Units total) by mouth every 7 (seven) days.   08/09/2024   feeding supplement (ENSURE PLUS HIGH PROTEIN) LIQD Take 237 mLs by mouth 3 (three) times daily between meals.      Social History   Socioeconomic History   Marital status: Divorced    Spouse name: Not on file   Number of children: Not on file   Years of education: Not on file   Highest education level: Not on file  Occupational History   Not on file  Tobacco Use   Smoking status: Former    Current packs/day: 0.00    Types: Cigarettes    Quit date: 35    Years since quitting: 32.6   Smokeless tobacco: Current    Types: Snuff   Tobacco comments:    changed to dip  Vaping Use   Vaping status: Never Used  Substance and Sexual Activity   Alcohol  use: Yes    Alcohol /week: 26.0 standard drinks of alcohol      Types: 26 Cans of beer per week   Drug use: No   Sexual activity: Yes    Birth control/protection: None  Other Topics Concern   Not on file  Social History Narrative   Not on file   Social Drivers of Health   Financial Resource Strain: Not on file  Food Insecurity:  No Food Insecurity (08/13/2024)   Hunger Vital Sign    Worried About Running Out of Food in the Last Year: Never true    Ran Out of Food in the Last Year: Never true  Transportation Needs: No Transportation Needs (08/13/2024)   PRAPARE - Administrator, Civil Service (Medical): No    Lack of Transportation (Non-Medical): No  Physical Activity: Not on file  Stress: Not on file  Social Connections: Socially Isolated (08/13/2024)   Social Connection and Isolation Panel    Frequency of Communication with Friends and Family: More than three times a week    Frequency of Social Gatherings with Friends and Family: More than three times a week    Attends Religious Services: Never    Database administrator or Organizations: No    Attends Banker Meetings: Never    Marital Status: Divorced  Catering manager Violence: Not At Risk (08/13/2024)   Humiliation, Afraid, Rape, and Kick questionnaire    Fear of Current or Ex-Partner: No    Emotionally Abused: No    Physically Abused: No    Sexually Abused: No    Family History  Problem Relation Age of Onset   Pneumonia Mother      Vitals:   08/20/24 2350 08/21/24 0356 08/21/24 0500 08/21/24 0843  BP: 115/65 116/60  118/79  Pulse: 77 73  66  Resp: 20 20  18   Temp: 98.5 F (36.9 C) 97.9 F (36.6 C)  97.8 F (36.6 C)  TempSrc:    Oral  SpO2: 100% 100%  100%  Weight:   104.7 kg   Height:        PHYSICAL EXAM General: Chronically ill-appearing elderly male, well nourished, in no acute distress. HEENT: Normocephalic and atraumatic. Neck: No JVD.   Lungs: Normal respiratory effort on 2L [place]. Clear bilaterally to auscultation, diminished  bilaterally. Heart: HRRR. Normal S1 and S2, + systolic murmur.  Abdomen: Non-distended appearing.  Msk: Normal strength and tone for age. Extremities: Warm and well perfused. No clubbing, cyanosis, 1+ pedal edema.  Neuro: Alert and oriented X 3. Psych: Answers questions appropriately.   Labs: Basic Metabolic Panel: Recent Labs    08/20/24 0620 08/21/24 0541  NA 138 138  K 4.3 4.2  CL 101 98  CO2 29 31  GLUCOSE 173* 120*  BUN 29* 30*  CREATININE 1.15 1.12  CALCIUM  8.4* 8.2*   Liver Function Tests: No results for input(s): AST, ALT, ALKPHOS, BILITOT, PROT, ALBUMIN  in the last 72 hours.  No results for input(s): LIPASE, AMYLASE in the last 72 hours.  CBC: Recent Labs    08/20/24 0620 08/21/24 0541  HGB 9.4* 9.8*  HCT 31.0*  --    Cardiac Enzymes: No results for input(s): CKTOTAL, CKMB, CKMBINDEX, TROPONINIHS in the last 72 hours.  BNP: No results for input(s): BNP in the last 72 hours.  D-Dimer: No results for input(s): DDIMER in the last 72 hours. Hemoglobin A1C: Recent Labs    08/19/24 0555  HGBA1C 5.3   Fasting Lipid Panel: No results for input(s): CHOL, HDL, LDLCALC, TRIG, CHOLHDL, LDLDIRECT in the last 72 hours. Thyroid Function Tests: No results for input(s): TSH, T4TOTAL, T3FREE, THYROIDAB in the last 72 hours.  Invalid input(s): FREET3 Anemia Panel: No results for input(s): VITAMINB12, FOLATE, FERRITIN, TIBC, IRON , RETICCTPCT in the last 72 hours.   Radiology: [facility] Chest Port 1 View Result Date: 08/16/2024 EXAM: 1 VIEW(S) XRAY OF THE CHEST 08/16/2024 10:45:00 AM COMPARISON:  CTA chest dated 07/11/2024 and chest X-ray dated 08/13/2024. CLINICAL HISTORY: Shortness of breath FINDINGS: LUNGS AND PLEURA: No focal consolidation. No pleural effusion. No pneumothorax. A nodular opacity is present in the right upper lung laterally, which is grossly similar to the CTA chest dated 07/11/2024. HEART AND  MEDIASTINUM: Stable cardiomediastinal silhouette with evidence of sternotomy and CABG. BONES AND SOFT TISSUES: No acute osseous abnormality. IMPRESSION: 1. No acute process. 2. Stable nodular opacity in the right upper lung laterally, grossly similar to CTA chest 07/11/2024. Given persistence, follow-up CT in 3-6 months is recommended. Electronically signed by: Norman Gatlin MD 08/16/2024 11:02 AM EDT RP Workstation: [id number]   DG Chest Port 1 View Result Date: 08/13/2024 CLINICAL DATA:  Shortness of breath.  COVID positive. EXAM: PORTABLE CHEST 1 VIEW COMPARISON:  August 12, 2024. FINDINGS: Stable cardiomediastinal silhouette. Status post coronary artery bypass graft. Left lung is clear. Minimal right pleural effusion is noted with associated minimal associated atelectasis. Bony thorax is unremarkable. IMPRESSION: Minimal right pleural effusion is noted with associated minimal right basilar atelectasis. Electronically Signed   By: Lynwood Landy Raddle M.D.   On: 08/13/2024 17:20   DG Chest Portable 1 View Result Date: 08/12/2024 CLINICAL DATA:  Syncopal episode. Septic shock. Clinical concern for pneumonia. EXAM: PORTABLE CHEST 1 VIEW COMPARISON:  07/29/2024. Abdomen and pelvis CTA dated 08/12/2024. Chest CTA dated 07/11/2024. FINDINGS: Interval enlarged cardiac silhouette. Stable post CABG changes. The lungs are hyperexpanded with mildly prominent upper lung zone pulmonary vasculature. Small right pleural effusion. Biapical bullous changes. Unremarkable bones. IMPRESSION: 1. Interval cardiomegaly and mild pulmonary vascular congestion. 2. Small right pleural effusion. 3. COPD with biapical bullous changes. Electronically Signed   By: Elspeth Bathe M.D.   On: 08/12/2024 16:58   CT ANGIO GI BLEED Result Date: 08/12/2024 CLINICAL DATA:  Anemia, syncope and melena. EXAM: CTA ABDOMEN AND PELVIS WITHOUT AND WITH CONTRAST TECHNIQUE: Multidetector CT imaging of the abdomen and pelvis was performed using the standard  protocol during bolus administration of intravenous contrast. Multiplanar reconstructed images and MIPs were obtained and reviewed to evaluate the vascular anatomy. RADIATION DOSE REDUCTION: This exam was performed according to the departmental dose-optimization program which includes automated exposure control, adjustment of the mA and/or kV according to patient size and/or use of iterative reconstruction technique. CONTRAST:  OMNIPAQUE  IOHEXOL  350 MG/ML SOLN COMPARISON:  CT abdomen and pelvis 03/24/2016 FINDINGS: VASCULAR Aorta: Atherosclerosis of the abdominal aorta without aneurysm. Ulcerated plaque of the anterior infrarenal segment. Celiac: Calcified plaque causes approximately 50-60% origin stenosis. Distal branches are normally patent. SMA: Atherosclerosis of SMA trunk without significant stenosis. Renals: Heavily calcified plaque in proximal segments bilateral single renal arteries causes moderate stenoses of approximately 50-70%. The left renal artery almost immediately bifurcates beyond its origin. IMA: Patent small caliber IMA. Inflow: Heavily calcified bilateral iliac arteries with diffuse disease and no aneurysmal disease or obstruction. Proximal Outflow: Calcified common femoral arteries. Probable significant stenosis at the origin of the right SFA. Veins: Venous phase imaging demonstrates patent venous structures in the abdomen and pelvis without thrombus or other abnormalities. Review of the MIP images confirms the above findings. NON-VASCULAR Lower chest: No acute abnormality. Hepatobiliary: Potential early cirrhotic morphology of the liver. No hepatic masses identified. Status post cholecystectomy. No biliary dilatation. Pancreas: Unremarkable. No pancreatic ductal dilatation or surrounding inflammatory changes. Spleen: Normal in size without focal abnormality. Adrenals/Urinary Tract: Adrenal glands are unremarkable. Kidneys are normal, without renal calculi, focal lesion, or hydronephrosis.  Bladder is unremarkable. Stomach/Bowel:  Speckled high density material in the stomach is present on imaging prior to contrast administration. There is no evidence active bleeding on arterial or venous phases of imaging within the gastrointestinal tract. Bowel shows no evidence of obstruction, ileus, inflammation or lesion. The appendix is not discretely visualized. No free intraperitoneal air. Lymphatic: No enlarged abdominal or pelvic lymph nodes. Reproductive: Prostate is unremarkable. Other: No abdominal wall hernia or abnormality. No abdominopelvic ascites. Musculoskeletal: Mild loss of height of the T12 vertebral body. IMPRESSION: 1. No evidence of active bleeding within the gastrointestinal tract. 2. Atherosclerosis of the abdominal aorta and iliac arteries without aneurysm. Ulcerated plaque of the anterior infrarenal aorta. 3. 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 4. Moderate stenoses of approximately 50-70% in proximal bilateral renal arteries. 5. Probable significant stenosis at the origin of the right SFA. 6. Potential early cirrhotic morphology of the liver. 7. Mild loss of height of the T12 vertebral body. Aortic Atherosclerosis (ICD10-I70.0). Electronically Signed   By: Marcey Moan M.D.   On: 08/12/2024 16:21   DG Chest Port 1 View Result Date: 07/29/2024 CLINICAL DATA:  Congestive heart failure. EXAM: PORTABLE CHEST 1 VIEW COMPARISON:  07/24/2024 FINDINGS: The enteric tube has been removed. Post median sternotomy with stable cardiomegaly. Bilateral pleural effusions and bibasilar opacities, slightly worsened in the interim. Improved interstitial opacities with mild residual. Underlying emphysematous change. No pneumothorax. IMPRESSION: 1. Bilateral pleural effusions and bibasilar opacities, slightly worsened in the interim. 2. Improved interstitial opacities with mild residual. Electronically Signed   By: Andrea Gasman M.D.   On: 07/29/2024 09:53   DG Chest 1  View Result Date: 07/24/2024 CLINICAL DATA:  [id number] Dyspnea [id number] EXAM: CHEST  1 VIEW COMPARISON:  July 23, 2024, July 11, 2024 FINDINGS: Esophagogastric tube terminates below the diaphragm, outside the field of view. Diffuse interstitial opacities throughout both lungs. No lobar consolidation or pneumothorax. Lateral right upper lobe nodular opacity measures 1.9 cm, corresponding to the lung nodule on the prior chest CT. Moderate cardiomegaly. Sternotomy wires and CABG markers. No acute fracture or destructive lesions. Multilevel thoracic osteophytosis. IMPRESSION: 1. Moderate cardiomegaly. Unchanged interstitial opacities throughout both lungs, possibly persistent interstitial edema and underlying emphysematous changes. 2. Similarly appearing lateral right upper lung zone nodular opacity. Please see the comparison chest CT for further characterization. Electronically Signed   By: Rogelia Myers M.D.   On: 07/24/2024 08:43   DG Chest Port 1 View Result Date: 07/23/2024 CLINICAL DATA:  Shortness of breath. EXAM: PORTABLE CHEST 1 VIEW COMPARISON:  July 22, 2024. FINDINGS: The heart size and mediastinal contours are within normal limits. Status post coronary bypass graft. Mild diffuse interstitial densities are noted concerning for possible pulmonary edema with possible small pleural effusions. Nasogastric tube is seen entering stomach. The visualized skeletal structures are unremarkable. IMPRESSION: Mild diffuse interstitial densities are noted concerning for possible pulmonary edema with small pleural effusions. Electronically Signed   By: Lynwood Landy Raddle M.D.   On: 07/23/2024 13:03   DG ABD ACUTE 2+V W 1V CHEST Result Date: 07/22/2024 CLINICAL DATA:  [id number] Dyspnea [id number] [phone] Encounter for imaging study to confirm nasogastric (NG) tube placement [id number] EXAM: DG ABDOMEN ACUTE WITH 1 VIEW CHEST COMPARISON:  Chest x-ray 07/19/2024 FINDINGS: Enteric tube courses below the diaphragm with tip and side port  overlying the expected region of the gastric lumen. The heart and mediastinal contours are within normal limits. No focal consolidation. Chronic coarsened interstitial markings with no overt pulmonary edema. Bilateral trace pleural effusion.  No pneumothorax. Right upper quadrant surgical clips. There is no evidence of dilated bowel loops or free intraperitoneal air. No radiopaque calculi or other significant radiographic abnormality is seen. No acute osseous abnormality.  Intact sternotomy wires. IMPRESSION: 1. Enteric tube in good position. 2. Bowel trace pleural effusions. 3. Nonobstructive bowel gas pattern. Electronically Signed   By: Morgane  Naveau M.D.   On: 07/22/2024 14:18    ECHO 05/10/2024  1. Left ventricular ejection fraction, by estimation, is 60 to 65%. The left ventricle has normal function. The left ventricle has no regional wall motion abnormalities. The left ventricular internal cavity size was mildly dilated. Left ventricular diastolic parameters are consistent with Grade I diastolic dysfunction (impaired relaxation).   2. Right ventricular systolic function is normal. The right ventricular size is normal.   3. The mitral valve is normal in structure. No evidence of mitral valve regurgitation.   4. The aortic valve is normal in structure. Aortic valve regurgitation is trivial. Severe aortic valve stenosis.   TELEMETRY reviewed by me 08/21/2024: Sinus rhythm, rate 60s   EKG reviewed by me: sinus rhythm with borderline ST depression (similar to prior EKGs), rate 93 bpm  Data reviewed by me 08/21/2024: last 24h vitals tele labs imaging I/O hospitalist progress notes.  Principal Problem:   GI bleed Active Problems:   COPD with acute exacerbation (HCC)   Elevated troponin   Acute blood loss anemia   CKD stage 3a, GFR 45-59 ml/min (HCC)   Severe aortic stenosis   Acute on chronic diastolic CHF (congestive heart failure) (HCC)   Hemorrhagic shock (HCC)   History of CAD (coronary  artery disease)   COVID-19 virus infection   Acute on chronic hypoxic respiratory failure (HCC)   Obesity (BMI 30-39.9)   NSTEMI (non-ST elevated myocardial infarction) (HCC)    ASSESSMENT AND PLAN:  [name] is a 63 y.o. 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 Of note, patient recently hospitalized 07/26 - 08/14 for ETOH withdrawal requiring mechanical intubation with PNA due to recurrent aspiration with AoC CHF. Patient was discharged to [facility]. During this admission, Hgb found to be 4.7. Troponin found to be elevated, patient denies any chest pain.  Cardiology was consulted for further evaluation.   # Acute on chronic hypoxic respiratory failure, resolved # COPD exacerbation # Leukocytosis # COVID positive Weaned to baseline O2 is 2L. -Management per primary team.  # Chronic HFpEF Patient does not appear significantly overloaded. CXR with mild pulmonary vascular congestion.  Mild lower extremity edema.  -Monitor and replenish electrolytes for a goal K >4, Mag >2  -Continue home torsemide  20 mg daily.  -Continue home dapagliflozin  10 mg daily.  # Severe aortic stenosis Echo from 04/2024 revealed severe AS. Aortic valve mean gradient measures  39.0 mmHg. Aortic valve peak gradient 66.3 mmHg. Aortic valve area, by VTI 0.49 cm.   -Dr. Katina visited patient in hospital and discussed aortic valve repair/replacement (09/03).  -Plan for CT TAVR imaging today.  Patient has remained n.p.o. and is agreeable.   # Demand ischemia # Coronary artery  disease s/p  CABG x3 (04/2012) s/p recent stent (07/02/24) # Hypertension # Hyperlipidemia # Hx Ventricular Ectopy Patient without chest pain. Trops elevated and flat 888 > 793 . EKG with no acute ischemic changes (similar to prior EKG).   -Continue home plavix  75 mg and aspirin  81 mg daily. Per GI cleared patient to resume with no evidence of active bleeding at this time. Closely monitor H&H.  -Continue Crestor  20 mg daily for high-intensity statin management.  -Continue home amio 200 mg daily for hx ventricular ectopy. Per tele this admission no evidence of PVCs. Will continue to monitor tele.  -Continue home losartan  25 mg, metoprolol  succinate 25 mg daily. -Continue home Imdur  30 mg daily. -Elevated and flat trops in setting of GI bleed with severe anemia Hgb 4.7 is most consistent with demand/supply mismatch and not ACS. No further cardiac diagnostics at this time.   # GI bleed # Acute on chronic anemia # Hx AVM Upon admission Hgb 4.7. Patient received 2 units PRBCs and 1 unit of platelets. Hgb has remained stable over the last few days. -Closely monitor H&H. -Goal Hgb >8 due to cardiac history. Recommend transfusion when <8.  -Management per primary team.   Follow-up with Dr. Ammon on 09/11 at 11:15 AM. Pending SNF. Sent TAVR images to [facility] for TAVR planning, Dr. Katina notified. No further cardiac recommendations at this time. Cardiology will sign off. Please haiku with questions or re-engage if needed. Pending SNF  This patient's plan of care was discussed and created with Dr. Florencio and he is in agreement.  Signed: Dorene Comfort, PA-C  08/21/2024, 9:19 AM [facility] Cardiology

## 2024-08-21 NOTE — TOC Progression Note (Addendum)
 Transition of Care [facility]) - Progression Note    Patient Details  Name: [name] MRN: [id number] Date of Birth: 23-Mar-1961  Transition of [facility]) CM/SW Contact  [name], LCSW Phone Number: 08/21/2024, 9:04 AM  Clinical Narrative:   SNF insurance authorization is still pending.  11:53 am: Per MD, plan for TAVR while inpatient. [employer] called to offer peer-to-peer review for SNF authorization. CSW withdrew auth request. Will resubmit once stable after procedure.  3:08 pm: Per MD, TAVR will no longer occur inpatient. CSW restarted auth.  4:02 pm: Insurance is offering a peer-to-peer review. Deadline is tomorrow at 11:00 am. MD is aware.  4:32 pm: Received denial. Updated patient. He would rather go home with home health than appeal. CSW called and updated HCPOA/landlord, [name]. [facility] confirmed they can accept him back for PT, OT, RN. Start of care will likely be Monday. Sent secure chat to MD to update and inquire if he was on oxygen prior to admission.    Barriers to Discharge: Continued Medical Work up               Expected Discharge Plan and Services       Living arrangements for the past 2 months: Skilled Nursing Facility, Single Family Home Expected Discharge Date: 08/19/24                                     Social Drivers of Health (SDOH) Interventions SDOH Screenings   Food Insecurity: No Food Insecurity (08/13/2024)  Housing: Low Risk  (08/13/2024)  Transportation Needs: No Transportation Needs (08/13/2024)  Utilities: Not At Risk (08/13/2024)  Social Connections: Socially Isolated (08/13/2024)  Tobacco Use: High Risk (08/12/2024)    Readmission Risk Interventions    08/13/2024   12:01 PM 05/14/2024    8:44 AM 10/27/2022   11:28 AM  Readmission Risk Prevention Plan  Transportation Screening Complete  Complete  PCP or Specialist Appt within 5-7 Days   Complete  PCP or Specialist Appt within 3-5 Days  Complete   Home Care  Screening   Complete  Medication Review (RN CM)   Complete  HRI or Home Care Consult  Complete   Social Work Consult for Recovery Care Planning/Counseling  Complete   Palliative Care Screening  Not Applicable   Medication Review Oceanographer) Complete Complete   Palliative Care Screening Not Applicable    Skilled Nursing Facility Complete

## 2024-08-22 ENCOUNTER — Other Ambulatory Visit: Payer: Self-pay

## 2024-08-22 DIAGNOSIS — R578 Other shock: Secondary | ICD-10-CM | POA: Diagnosis not present

## 2024-08-22 DIAGNOSIS — D62 Acute posthemorrhagic anemia: Secondary | ICD-10-CM | POA: Diagnosis not present

## 2024-08-22 LAB — GLUCOSE, CAPILLARY
Glucose-Capillary: 140 mg/dL — ABNORMAL HIGH (ref 70–99)
Glucose-Capillary: 175 mg/dL — ABNORMAL HIGH (ref 70–99)

## 2024-08-22 LAB — BASIC METABOLIC PANEL WITH GFR
Anion gap: 10 (ref 5–15)
BUN: 37 mg/dL — ABNORMAL HIGH (ref 8–23)
CO2: 31 mmol/L (ref 22–32)
Calcium: 8.3 mg/dL — ABNORMAL LOW (ref 8.9–10.3)
Chloride: 96 mmol/L — ABNORMAL LOW (ref 98–111)
Creatinine, Ser: 1.22 mg/dL (ref 0.61–1.24)
GFR, Estimated: >60 mL/min (ref >60)
Glucose, Bld: 135 mg/dL — ABNORMAL HIGH (ref 70–99)
Potassium: 4.3 mmol/L (ref 3.5–5.1)
Sodium: 137 mmol/L (ref 135–145)

## 2024-08-22 LAB — CBC
HCT: 31.9 % — ABNORMAL LOW (ref 39.0–52.0)
Hemoglobin: 9.6 g/dL — ABNORMAL LOW (ref 13.0–17.0)
MCH: 28.7 pg (ref 26.0–34.0)
MCHC: 30.1 g/dL (ref 30.0–36.0)
MCV: 95.5 fL (ref 80.0–100.0)
Platelets: 257 K/uL (ref 150–400)
RBC: 3.34 MIL/uL — ABNORMAL LOW (ref 4.22–5.81)
RDW: 17.3 % — ABNORMAL HIGH (ref 11.5–15.5)
WBC: 19.6 K/uL — ABNORMAL HIGH (ref 4.0–10.5)
nRBC: 0 % (ref 0.0–0.2)

## 2024-08-22 MED ORDER — METOPROLOL SUCCINATE ER 25 MG PO TB24
25.0000 mg | ORAL_TABLET | Freq: Every day | ORAL | 1 refills | Status: DC
  Filled 2024-08-22: qty 30, 30d supply, fill #0

## 2024-08-22 MED ORDER — FE FUM-VIT C-VIT B12-FA 460-60-0.01-1 MG PO CAPS
1.0000 | ORAL_CAPSULE | Freq: Two times a day (BID) | ORAL | 1 refills | Status: AC
  Filled 2024-08-22: qty 60, 30d supply, fill #0

## 2024-08-22 MED ORDER — ISOSORBIDE MONONITRATE ER 30 MG PO TB24
30.0000 mg | ORAL_TABLET | Freq: Every day | ORAL | 1 refills | Status: DC
  Filled 2024-08-22: qty 30, 30d supply, fill #0

## 2024-08-22 MED ORDER — PREDNISONE 10 MG PO TABS
10.0000 mg | ORAL_TABLET | Freq: Every day | ORAL | 0 refills | Status: AC
  Filled 2024-08-22: qty 2, 2d supply, fill #0

## 2024-08-22 MED ORDER — TRELEGY ELLIPTA 200-62.5-25 MCG/ACT IN AEPB
1.0000 | INHALATION_SPRAY | Freq: Every day | RESPIRATORY_TRACT | 0 refills | Status: AC
  Filled 2024-08-22: qty 60, 30d supply, fill #0

## 2024-08-22 MED ORDER — TORSEMIDE 20 MG PO TABS
20.0000 mg | ORAL_TABLET | Freq: Every day | ORAL | 1 refills | Status: AC
  Filled 2024-08-22: qty 30, 30d supply, fill #0

## 2024-08-22 MED ORDER — PANTOPRAZOLE SODIUM 40 MG PO TBEC
40.0000 mg | DELAYED_RELEASE_TABLET | Freq: Every day | ORAL | 1 refills | Status: AC
  Filled 2024-08-22: qty 30, 30d supply, fill #0

## 2024-08-22 MED ORDER — POLYETHYLENE GLYCOL 3350 17 GM/SCOOP PO POWD
17.0000 g | Freq: Every day | ORAL | 0 refills | Status: AC
Start: 2024-08-22 — End: ?
  Filled 2024-08-22: qty 510, 30d supply, fill #0

## 2024-08-22 MED ORDER — ASPIRIN 81 MG PO TBEC: 81.0000 mg | DELAYED_RELEASE_TABLET | Freq: Every day | ORAL | Status: AC

## 2024-08-22 MED ORDER — LOSARTAN POTASSIUM 25 MG PO TABS
25.0000 mg | ORAL_TABLET | Freq: Every day | ORAL | 1 refills | Status: DC
Start: 2024-08-23 — End: 2024-09-24
  Filled 2024-08-22: qty 30, 30d supply, fill #0

## 2024-08-22 MED ORDER — MUPIROCIN 2 % EX OINT
TOPICAL_OINTMENT | Freq: Two times a day (BID) | CUTANEOUS | 0 refills | Status: AC
  Filled 2024-08-22: qty 22, 10d supply, fill #0

## 2024-08-22 MED ORDER — AMIODARONE HCL 200 MG PO TABS: 200.0000 mg | ORAL_TABLET | Freq: Every day | ORAL | Status: DC

## 2024-08-22 MED ORDER — GABAPENTIN 300 MG PO CAPS
300.0000 mg | ORAL_CAPSULE | Freq: Two times a day (BID) | ORAL | 0 refills | Status: AC
  Filled 2024-08-22: qty 60, 30d supply, fill #0

## 2024-08-22 MED ORDER — ISOSORBIDE MONONITRATE ER 30 MG PO TB24
30.0000 mg | ORAL_TABLET | Freq: Every day | ORAL | 1 refills | Status: DC
Start: 2024-08-22 — End: 2024-10-30
  Filled 2024-08-22: qty 30, 30d supply, fill #0

## 2024-08-22 MED ORDER — LOSARTAN POTASSIUM 25 MG PO TABS
25.0000 mg | ORAL_TABLET | Freq: Every day | ORAL | 1 refills | Status: DC
  Filled 2024-08-22: qty 30, 30d supply, fill #0

## 2024-08-22 NOTE — Discharge Summary (Addendum)
 Physician Discharge Summary   Patient: [name] MRN: [id number] DOB: [birth year]  Admit date:     08/12/2024  Discharge date: 08/22/24  Discharge Physician: Laree Lock   PCP: Osa Geralds, NP   Recommendations at discharge:    Follow-up with PCP -repeat BMP, CBC within 1 week  Follow-up with cardiology on 09/11 Follow-up with pulmonary on 09/24 Follow-up with vascular surgery on 09/24  Home PT/OT/RN arranged  Discharge Diagnoses: Principal Problem:   GI bleed Active Problems:   Acute blood loss anemia   Hemorrhagic shock (HCC)   COVID-19 virus infection   COPD with acute exacerbation (HCC)   Acute on chronic diastolic CHF (congestive heart failure) (HCC)   Elevated troponin   History of CAD (coronary artery disease)   Acute on chronic hypoxic respiratory failure (HCC)   Severe aortic stenosis   CKD stage 3a, GFR 45-59 ml/min (HCC)   Obesity (BMI 30-39.9)   NSTEMI (non-ST elevated myocardial infarction) (HCC)  Resolved Problems:   * No resolved hospital problems. *  Hospital Course: [name] is a 63 y.o. male PMH multiple medical comorbidities including COPD on 2 L nasal cannula, CAD with prior CABG and recent stent placement, HLD, stroke, dementia, diastolic CHF, prior GI bleed not on anticoagulation (was on dual antiplatelet therapy) presented for evaluation of reported syncopal episode, apparently patient has a bloody bowel movement.   Patient was initially admitted in ICU for concern of hemorrhagic shock secondary to acute GI bleed. S/p multiple units pRBC and platelet transfusion, Stabilized and IV Venofer , Seen by GI. Found to be COVID positive, hypoxia improved.  Seen by Cardiology Dr.Pride (valve specialist) plan to follow up outpatient.  CT TAVR protocol revealed irregular density in RUL concerning for malignancy, discussed with pulmonary.  Follow-up appointment pending for 09/24.  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 Levophed  was ordered but never given   Chronic diastolic CHF (congestive heart failure) (HCC) CXR with mild pulmonary vascular congestion, mild lower extremity edema. Resume home torsemide  20 mg, dapagliflozin  10mg  daily   Severe aortic stenosis Echo 05/25 revealed severe AS.  Aortic valve peak gradient 66.3 mmHg Seen by Dr. Katina, discussed aortic valve repair/replacement CT TAVR imaging done 9/04, images being sent to [facility] by cardiology. Follow-up with Dr.Paraschos on 09/11 at 11:15 AM.   COPD with acute exacerbation (HCC) COVID-19 virus infection Prednisone  taper Change to Trelegy   History of CAD (coronary artery disease) s/p CABG (2013), stent (7/25) Patient with history of NSTEMI s/p PCI in July 2025 Troponin elevated, likely secondary to demand with GI bleed. Initially aspirin  and Plavix  was held but restarted. Continue Crestor , losartan , metoprolol , Imdur    Hx ventricular ectopy Continue home amiodarone , metoprolol   Incidental findings Irregular density in RUL -Imaging with 2.8 x 1.4 cm irregular density in RUL concerning for malignancy Discussed with pulmonary,  appointment scheduled for 09/24 -Imaging with extensive atherosclerosis of abdominal aorta, moderate stenosis at origin of celiac artery, noted stenosis at renal  arteries, right common iliac artery. Vascular surgery appointment on 09/24 pending   Acute on chronic hypoxic respiratory failure - resolved Chronically on 2 L   Obesity (BMI 30-39.9) Class I with a BMI of 30.95   CKD stage 3a, GFR 45-59 ml/min (HCC) Creatinine 1.42 with a GFR 55     Consultants: Cardiology, gastroenterology Procedures performed: None Disposition: Home health Diet recommendation:  Discharge Diet Orders (From admission, onward)     Start     Ordered   08/22/24 0000  Diet - low sodium heart healthy        08/22/24 1502            DISCHARGE MEDICATION: Allergies as of 08/22/2024       Reactions   Wellbutrin  [bupropion ]    Paranoia         Medication List     STOP taking these medications    budesonide  0.5 MG/2ML nebulizer solution Commonly known as: PULMICORT    feeding supplement Liqd   Ferrex 150 150 MG capsule Generic drug: iron  polysaccharides   folic acid  1 MG tablet Commonly known as: FOLVITE    Glycerin  (Adult) 2 g Supp   HYDROcodone -acetaminophen  5-325 MG tablet Commonly known as: NORCO/VICODIN   ipratropium-albuterol  0.5-2.5 (3) MG/3ML Soln Commonly known as: DUONEB   lidocaine  4 %   polyethylene glycol 17 g packet Commonly known as: MIRALAX  / GLYCOLAX  Replaced by: polyethylene glycol powder 17 GM/SCOOP powder   senna-docusate 8.6-50 MG tablet Commonly known as: Senokot-S   sodium chloride  0.65 % Soln nasal spray Commonly known as: OCEAN   traZODone  50 MG tablet Commonly known as: DESYREL        TAKE these medications    albuterol  108 (90 Base) MCG/ACT inhaler Commonly known as: VENTOLIN  HFA Inhale 2 puffs into the lungs every 6 (six) hours as needed for wheezing or shortness of breath.   amiodarone  200 MG tablet Commonly known as: PACERONE  Take 1 tablet (200 mg total) by mouth daily.   ascorbic acid  500 MG tablet Commonly known as: VITAMIN C  Take 1 tablet (500 mg total) by mouth daily.   aspirin  EC 81  MG tablet Take 1 tablet (81 mg total) by mouth daily. Swallow whole.   clopidogrel  75 MG tablet Commonly known as: PLAVIX  Take 1 tablet (75 mg total) by mouth daily with breakfast.   dapagliflozin  propanediol 10 MG Tabs tablet Commonly known as: FARXIGA  Take 1 tablet (10 mg total) by mouth daily.   dextromethorphan -guaiFENesin  30-600 MG 12hr tablet Commonly known as: MUCINEX  DM Take 1 tablet by mouth 2 (two) times daily as needed for cough.   Fe Fum-Vit C-Vit B12-FA Caps capsule Commonly known as: TRIGELS-F FORTE Take 1 capsule by mouth 2 (two) times daily.   gabapentin  300 MG capsule Commonly known as: NEURONTIN  Take 1 capsule (300 mg total) by mouth 2 (two) times daily.   isosorbide  mononitrate 30 MG 24 hr tablet Commonly known as: IMDUR  Take 1 tablet (30 mg total) by mouth daily.   lactulose  10 GM/15ML solution Commonly known as: CHRONULAC  Take 30 mLs (20 g total) by mouth daily as needed for moderate constipation or mild constipation.   losartan  25 MG tablet Commonly known as: COZAAR  Take 1 tablet (25 mg total) by mouth daily.   melatonin 5 MG Tabs Place 1 tablet (5 mg total) into feeding tube at bedtime as  needed (sleep). What changed: reasons to take this   metoprolol  succinate 25 MG 24 hr tablet Commonly known as: TOPROL -XL Take 1 tablet (25 mg total) by mouth daily.   multivitamin with minerals Tabs tablet Take 1 tablet by mouth daily.   mupirocin  ointment 2 % Commonly known as: BACTROBAN  Place into the nose 2 (two) times daily for 5 days.   nicotine  7 mg/24hr patch Commonly known as: NICODERM CQ  - dosed in mg/24 hr One 7mg  patch chest wall daily What changed:  how much to take how to take this when to take this additional instructions   pantoprazole  40 MG tablet Commonly known as: Protonix  Take 1 tablet (40 mg total) by mouth daily.   polyethylene glycol powder 17 GM/SCOOP powder Commonly known as: GLYCOLAX /MIRALAX  Take 17 g by mouth at  bedtime. Mix as directed. Replaces: polyethylene glycol 17 g packet   predniSONE  10 MG tablet Commonly known as: DELTASONE  Take 1 tablet (10 mg total) by mouth daily with breakfast for 2 days. Start taking on: August 23, 2024   rosuvastatin  20 MG tablet Commonly known as: CRESTOR  Take 1 tablet (20 mg total) by mouth daily.   thiamine  100 MG tablet Commonly known as: Vitamin B-1 Take 1 tablet (100 mg total) by mouth daily.   torsemide  20 MG tablet Commonly known as: DEMADEX  Take 1 tablet (20 mg total) by mouth daily.   Trelegy Ellipta  200-62.5-25 MCG/ACT Aepb Generic drug: Fluticasone -Umeclidin-Vilant Inhale 1 Inhalation into the lungs daily at 12 noon.   Vitamin D  (Ergocalciferol ) 1.25 MG (50000 UNIT) Caps capsule Commonly known as: DRISDOL  Take 1 capsule (50,000 Units total) by mouth every 7 (seven) days.               Durable Medical Equipment  (From admission, onward)           Start     Ordered   08/19/24 0827  For home use only DME Walker rolling  Once       Question Answer Comment  Walker: With 5 Inch Wheels   Patient needs a walker to treat with the following condition Generalized weakness      08/19/24 0826            Follow-up Information     Rennie Cindy SAUNDERS, MD Follow up in 2 week(s).   Specialties: Internal Medicine, Oncology Why: anemia follow up Please have patient call to set up the appointment Contact information: [address] [place] [address] [phone]         Osa Geralds, NP Follow up in 5 day(s).   Specialty: Nurse Practitioner Why: Hospital Follow-up: Please have patient call to set up the appointment Contact information: [address] [place] [address] [phone]         Ammon Blunt, MD. Go on 08/28/2024.   Specialty: Cardiology Why: 09/11 at 11:15 AM Contact information: [address] [facility] [place] [address] [phone]                 Discharge Exam: Fredricka Weights   08/20/24 0500 08/21/24 0500 08/22/24 0439  Weight: 104.5 kg 104.7 kg 105.8 kg   General:  well nourished, in no acute distress. HEENT: Normocephalic and atraumatic. Neck: No JVD.  Lungs: Normal respiratory effort on 2L [place]. Clear bilaterally to auscultation Heart: HRRR. Normal S1 and S2, + systolic murmur.  Abdomen: Non-distended appearing.  Msk: Normal strength and tone for age. Extremities: Warm and well perfused. No clubbing, cyanosis,  Trace pedal edema.  Neuro: Alert and oriented X 3  Condition at discharge: fair  The results of significant diagnostics from this hospitalization (including imaging, microbiology, ancillary and laboratory) are listed below for reference.   Imaging Studies: CT CORONARY MORPH W/CTA COR W/SCORE W/CA W/CM &/OR WO/CM Addendum Date: 08/22/2024 ADDENDUM REPORT: 08/21/2024 14:51 EXAM: OVER-READ INTERPRETATION  CT CHEST The following report is an over-read performed by radiologist Dr. Lynwood Seip [facility] on 08/21/2024. This over-read does not include interpretation of cardiac or coronary anatomy or pathology. The coronary CTA interpretation by the cardiologist is attached. COMPARISON:  July 11, 2024. FINDINGS: Aortic atherosclerosis is noted. No dissection or aneurysm is noted. The visualized portions of the extracardiac vascular structures are otherwise unremarkable. Grossly stable 27 x 12 mm irregular subpleural density is noted in anteriorly right upper lobe concerning for possible neoplasm or scarring. PET scan is recommended for further evaluation. Emphysematous disease is noted. Small bilateral pleural effusions are noted. Mild right posterior basilar atelectasis is noted. Visualized portion of upper abdomen is unremarkable. Visualized skeletal structures are unremarkable. IMPRESSION: 1. Grossly stable 27 x 12 mm irregular subpleural density is noted in anteriorly right upper lobe concerning for  possible neoplasm or scarring. PET scan is recommended for further evaluation. These results will be called to the ordering clinician or representative by the Radiologist Assistant, and communication documented in the PACS or zVision Dashboard. 2. Small bilateral pleural effusions are noted. Aortic Atherosclerosis (ICD10-I70.0) and Emphysema (ICD10-J43.9). Electronically Signed   By: Lynwood Seip Raddle M.D.   On: 08/21/2024 14:51   Addendum Date: 08/21/2024 ADDENDUM REPORT: 08/21/2024 14:51 EXAM: OVER-READ INTERPRETATION  CT CHEST The following report is an over-read performed by radiologist Dr. Lynwood Seip [facility] on 08/21/2024. This over-read does not include interpretation of cardiac or coronary anatomy or pathology. The coronary CTA interpretation by the cardiologist is attached. COMPARISON:  July 11, 2024. FINDINGS: Aortic atherosclerosis is noted. No dissection or aneurysm is noted. The visualized portions of the extracardiac vascular structures are otherwise unremarkable. Grossly stable 27 x 12 mm irregular subpleural density is noted in anteriorly right upper lobe concerning for possible neoplasm or scarring. PET scan is recommended for further evaluation. Emphysematous disease is noted. Small bilateral pleural effusions are noted. Mild right posterior basilar atelectasis is noted. Visualized portion of upper abdomen is unremarkable. Visualized skeletal structures are unremarkable. IMPRESSION: 1. Grossly stable 27 x 12 mm irregular subpleural density is noted in anteriorly right upper lobe concerning for possible neoplasm or scarring. PET scan is recommended for further evaluation. These results will be called to the ordering clinician or representative by the Radiologist Assistant, and communication documented in the PACS or zVision Dashboard. 2. Small bilateral pleural effusions are noted. Aortic Atherosclerosis (ICD10-I70.0) and Emphysema (ICD10-J43.9). Electronically Signed   By: Lynwood Seip Raddle M.D.   On: 08/21/2024 14:51   Result Date: 08/21/2024 CLINICAL DATA:  Severe Aortic Stenosis. EXAM: Cardiac TAVR CT TECHNIQUE: A non-contrast, gated CT scan was obtained with axial slices of 2.5 mm through the heart for aortic valve scoring. A 120 kV retrospective, gated, contrast cardiac scan was obtained. Gantry rotation speed was 230 msec and collimation was 0.63 mm. Nitroglycerin  was not given. A delayed scan was obtained to exclude left atrial appendage thrombus. The 3D dataset was reconstructed in systole with motion correction. The 3D data set was reconstructed in 5% intervals of the 0-95% of the R-R cycle. Systolic and diastolic phases were analyzed on  a dedicated workstation using MPR, MIP, and VRT modes. The patient received 100 cc of contrast. FINDINGS: Aortic Valve: Valve Morphology: Tricuspid with symmetric calcification and reduced excursion the planimeter valve area is 0.943 Sq cm consistent with severe aortic stenosis Annular and subannular calcification: Moderate annular calcification with two nodules with minimal protuberance. LVOT calcification is very inferior and should not interact with valve deployment. Aortic Valve Calcium  score: 2802 Presence of basal septal hypertrophy: Non severe (14 mm) Perimembranous septal diameter: 7 mm Mitral Valve: Severe mitral annular calcification with leaflet thickening. MAC score deferred (planned for potential TAVR only). Aortic Annulus Measurements- 20% phase Major annulus diameter: 29 mm Minor annulus diameter: 23 mm Annular perimeter: 81 mm Annular area: 4.99 cm2 Aortic Measurements- 70% phase Sinotubular junction: 30 mm Ascending Thoracic Aorta: 35 mm Aortic Arch: 29 mm Descending Thoracic Aorta: 27 mm Sinus of Valsalva Measurements: Right coronary cusp width: 31 mm Left coronary cusp width: 33 mm Non coronary cusp width: 33 mm Coronary Artery Height above Annulus: Left Main: 12 mm Left SoV height: 21 mm Right Coronary: 14 mm Right SoV height: 21  mm Optimum Fluoroscopic Angle for Delivery: Cusp overlay view angle: LAO 1, CAU 2 Anterior view RAO 0, CAU 3 Valves for structural team consideration: 26 mm Sapien Valve Patient is at the overlap of a 29-34 mm Evolut valve by perimeter sizing. Sized to a 29 mm Valve, but Sinus of Valsalva diameter and height are able to support up to a 34 mm valve. Non TAVR Valve Findings: Coronary Arteries: Normal coronary origin. Study not completed with nitroglycerin . Unable to view much of the coronary anatomy for evaluation. LIMA to LAD appears widely patent, cannot assess distal insertion. SVG to LCX system is patent at the ostium. SVG to RCA system is patent at the ostium. Coronary Calcium  Score deferred in the setting of prior bypass. Systemic veins: Normal anatomy. Main Pulmonary artery: Normal caliber Pulmonary veins: Normal anatomy. Left atrial appendage: Patent. Interatrial septum: No communication. Chamber dimensions: Mild right ventricular dilation Pericardium: No calcifications Extra Cardiac Findings as per separate reporting. Notable artifacts: Poor left sided contrast Image quality: Adequate IMPRESSION: 1. Severe Aortic stenosis. Measurements for potential interventions as above. Stanly Leavens MD Electronically Signed: By: Stanly Leavens M.D. On: 08/21/2024 13:45   CT ANGIO CHEST AORTA W/CM &/OR WO/CM Result Date: 08/21/2024 CLINICAL DATA:  Heart failure with preserved ejection fraction. Aortic stenosis. EXAM: CT ANGIOGRAPHY CHEST, ABDOMEN AND PELVIS TECHNIQUE: Non-contrast CT of the chest was initially obtained. Multidetector CT imaging through the chest, abdomen and pelvis was performed using the standard protocol during bolus administration of intravenous contrast. Multiplanar reconstructed images and MIPs were obtained and reviewed to evaluate the vascular anatomy. RADIATION DOSE REDUCTION: This exam was performed according to the departmental dose-optimization program which includes automated  exposure control, adjustment of the mA and/or kV according to patient size and/or use of iterative reconstruction technique. CONTRAST:  OMNIPAQUE  IOHEXOL  350 MG/ML SOLN COMPARISON:  August 12, 2024.  July 11, 2024. FINDINGS: CTA CHEST FINDINGS Cardiovascular: Status post coronary artery bypass graft. No evidence of thoracic aortic aneurysm or dissection. Moderate stenosis is noted at origin of left common carotid artery secondary to calcified plaque. Normal cardiac size. No pericardial effusion. Mediastinum/Nodes: Thyroid gland is unremarkable. Esophagus appears normal. 13 mm pretracheal lymph node is noted which is not significantly changed compared to prior exam and most likely reactive in etiology. Lungs/Pleura: Small bilateral pleural effusions are noted. No pneumothorax is noted. Emphysematous disease  is noted. Mild right basilar atelectasis is noted. Grossly stable 2.8 x 1.4 cm irregular density is noted anteriorly in right upper lobe concerning for malignancy. PET scan is recommended. Musculoskeletal: Probable old T12 compression fracture is noted. Review of the MIP images confirms the above findings. CTA ABDOMEN AND PELVIS FINDINGS VASCULAR Aorta: Atherosclerosis of abdominal aorta is noted. Large ulcerated plaque is noted anteriorly and superior portion of infrarenal abdominal aorta which is unchanged. Celiac: Moderate stenosis is noted at origin secondary to calcified plaque. SMA: Patent without evidence of aneurysm, dissection, vasculitis or significant stenosis. Renals: Moderate stenoses are noted at the origins of both vessels secondary to calcified plaque. No definite thrombosis. IMA: Patent without evidence of aneurysm, dissection, vasculitis or significant stenosis. Inflow: Extensive atherosclerosis is noted. Stable probable small dissection or atherosclerotic ulcer is seen involving right common iliac artery. Moderate calcified stenosis is noted in distal portion of right common iliac artery.  Probable severe stenosis involving origin of right superficial femoral artery secondary to calcified plaque. Probable moderate stenosis involving distal left common iliac artery secondary to calcified plaque. Veins: No obvious venous abnormality within the limitations of this arterial phase study. Review of the MIP images confirms the above findings. 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 Post cholecystectomy. No biliary dilatation. Pancreas: Unremarkable. No pancreatic ductal dilatation or surrounding inflammatory changes. Spleen: Normal in size without focal abnormality. Adrenals/Urinary Tract: Adrenal glands are unremarkable. Kidneys are normal, without renal calculi, focal lesion, or hydronephrosis. Bladder is unremarkable. Stomach/Bowel: Stomach is unremarkable. There is no evidence of bowel obstruction or inflammation. Lymphatic: No adenopathy is noted. Reproductive: Prostate is unremarkable. Other: No ascites or hernia is noted. Musculoskeletal: No acute or significant osseous findings. Review of the MIP images confirms the above findings. IMPRESSION: 1. No evidence of thoracic aortic aneurysm or dissection. 2. Moderate stenosis is noted at origin of left common carotid artery secondary to calcified plaque. 3. 2.8 x 1.4 cm irregular density is noted anteriorly in right upper lobe concerning for malignancy. PET scan is recommended. 4. Small bilateral pleural effusions are noted. 5. 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 6. Extensive atherosclerosis of abdominal aorta is noted. Large ulcerated plaque is noted anteriorly in superior portion of infrarenal abdominal aorta which is unchanged. 7. 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 8. Moderate stenoses are noted at the origins of both renal arteries secondary to calcified plaque. 9. Stable probable small dissection or atherosclerotic ulcer  is seen involving right common iliac artery. Moderate calcified stenosis is noted in distal portion of right common iliac artery. 10. Probable severe stenosis involving origin of right superficial femoral artery secondary to calcified plaque. 11. Probable moderate stenosis involving distal left common iliac artery secondary to calcified plaque. Aortic Atherosclerosis (ICD10-I70.0) and Emphysema (ICD10-J43.9). Electronically Signed   By: Lynwood Landy Raddle M.D.   On: 08/21/2024 12:13   CT Angio Abd/Pel w/ and/or w/o Result Date: 08/21/2024 CLINICAL DATA:  Heart failure with preserved ejection fraction. Aortic stenosis. EXAM: CT ANGIOGRAPHY CHEST, ABDOMEN AND PELVIS TECHNIQUE: Non-contrast CT of the chest was initially obtained. Multidetector CT imaging through the chest, abdomen and pelvis was performed using the standard protocol during bolus administration of intravenous contrast. Multiplanar reconstructed images and MIPs were obtained and reviewed to evaluate the vascular anatomy. RADIATION DOSE REDUCTION: This exam was performed according to the departmental dose-optimization program which includes automated exposure control, adjustment of the mA and/or kV according to  patient size and/or use of iterative reconstruction technique. CONTRAST:  OMNIPAQUE  IOHEXOL  350 MG/ML SOLN COMPARISON:  August 12, 2024.  July 11, 2024. FINDINGS: CTA CHEST FINDINGS Cardiovascular: Status post coronary artery bypass graft. No evidence of thoracic aortic aneurysm or dissection. Moderate stenosis is noted at origin of left common carotid artery secondary to calcified plaque. Normal cardiac size. No pericardial effusion. Mediastinum/Nodes: Thyroid gland is unremarkable. Esophagus appears normal. 13 mm pretracheal lymph node is noted which is not significantly changed compared to prior exam and most likely reactive in etiology. Lungs/Pleura: Small bilateral pleural effusions are noted. No pneumothorax is noted. Emphysematous  disease is noted. Mild right basilar atelectasis is noted. Grossly stable 2.8 x 1.4 cm irregular density is noted anteriorly in right upper lobe concerning for malignancy. PET scan is recommended. Musculoskeletal: Probable old T12 compression fracture is noted. Review of the MIP images confirms the above findings. CTA ABDOMEN AND PELVIS FINDINGS VASCULAR Aorta: Atherosclerosis of abdominal aorta is noted. Large ulcerated plaque is noted anteriorly and superior portion of infrarenal abdominal aorta which is unchanged. Celiac: Moderate stenosis is noted at origin secondary to calcified plaque. SMA: Patent without evidence of aneurysm, dissection, vasculitis or significant stenosis. Renals: Moderate stenoses are noted at the origins of both vessels secondary to calcified plaque. No definite thrombosis. IMA: Patent without evidence of aneurysm, dissection, vasculitis or significant stenosis. Inflow: Extensive atherosclerosis is noted. Stable probable small dissection or atherosclerotic ulcer is seen involving right common iliac artery. Moderate calcified stenosis is noted in distal portion of right common iliac artery. Probable severe stenosis involving origin of right superficial femoral artery secondary to calcified plaque. Probable moderate stenosis involving distal left common iliac artery secondary to calcified plaque. Veins: No obvious venous abnormality within the limitations of this arterial phase study. Review of the MIP images confirms the above findings. 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 Post cholecystectomy. No biliary dilatation. Pancreas: Unremarkable. No pancreatic ductal dilatation or surrounding inflammatory changes. Spleen: Normal in size without focal abnormality. Adrenals/Urinary Tract: Adrenal glands are unremarkable. Kidneys are normal, without renal calculi, focal lesion, or hydronephrosis. Bladder is unremarkable. Stomach/Bowel: Stomach  is unremarkable. There is no evidence of bowel obstruction or inflammation. Lymphatic: No adenopathy is noted. Reproductive: Prostate is unremarkable. Other: No ascites or hernia is noted. Musculoskeletal: No acute or significant osseous findings. Review of the MIP images confirms the above findings. IMPRESSION: 1. No evidence of thoracic aortic aneurysm or dissection. 2. Moderate stenosis is noted at origin of left common carotid artery secondary to calcified plaque. 3. 2.8 x 1.4 cm irregular density is noted anteriorly in right upper lobe concerning for malignancy. PET scan is recommended. 4. Small bilateral pleural effusions are noted. 5. 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 6. Extensive atherosclerosis of abdominal aorta is noted. Large ulcerated plaque is noted anteriorly in superior portion of infrarenal abdominal aorta which is unchanged. 7. 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 8. Moderate stenoses are noted at the origins of both renal arteries secondary to calcified plaque. 9. Stable probable small dissection or atherosclerotic ulcer is seen involving right common iliac artery. Moderate calcified stenosis is noted in distal portion of right common iliac artery. 10. Probable severe stenosis involving origin of right superficial femoral artery secondary to calcified plaque. 11. Probable moderate stenosis involving distal left common iliac artery secondary to calcified plaque. Aortic Atherosclerosis (ICD10-I70.0) and Emphysema (ICD10-J43.9). Electronically Signed   By: Lynwood  Landy Raddle M.D.   On: 08/21/2024 12:13   DG Chest Port 1 View Result Date: 08/16/2024 EXAM: 1 VIEW(S) XRAY OF THE CHEST 08/16/2024 10:45:00 AM COMPARISON: CTA chest dated 07/11/2024 and chest X-ray dated 08/13/2024. CLINICAL HISTORY: Shortness of breath FINDINGS: LUNGS AND PLEURA: No focal consolidation. No pleural effusion. No pneumothorax. A nodular opacity is  present in the right upper lung laterally, which is grossly similar to the CTA chest dated 07/11/2024. HEART AND MEDIASTINUM: Stable cardiomediastinal silhouette with evidence of sternotomy and CABG. BONES AND SOFT TISSUES: No acute osseous abnormality. IMPRESSION: 1. No acute process. 2. Stable nodular opacity in the right upper lung laterally, grossly similar to CTA chest 07/11/2024. Given persistence, follow-up CT in 3-6 months is recommended. Electronically signed by: Norman Gatlin MD 08/16/2024 11:02 AM EDT RP Workstation: [id number]   DG Chest Port 1 View Result Date: 08/13/2024 CLINICAL DATA:  Shortness of breath.  COVID positive. EXAM: PORTABLE CHEST 1 VIEW COMPARISON:  August 12, 2024. FINDINGS: Stable cardiomediastinal silhouette. Status post coronary artery bypass graft. Left lung is clear. Minimal right pleural effusion is noted with associated minimal associated atelectasis. Bony thorax is unremarkable. IMPRESSION: Minimal right pleural effusion is noted with associated minimal right basilar atelectasis. Electronically Signed   By: Lynwood Landy Raddle M.D.   On: 08/13/2024 17:20   DG Chest Portable 1 View Result Date: 08/12/2024 CLINICAL DATA:  Syncopal episode. Septic shock. Clinical concern for pneumonia. EXAM: PORTABLE CHEST 1 VIEW COMPARISON:  07/29/2024. Abdomen and pelvis CTA dated 08/12/2024. Chest CTA dated 07/11/2024. FINDINGS: Interval enlarged cardiac silhouette. Stable post CABG changes. The lungs are hyperexpanded with mildly prominent upper lung zone pulmonary vasculature. Small right pleural effusion. Biapical bullous changes. Unremarkable bones. IMPRESSION: 1. Interval cardiomegaly and mild pulmonary vascular congestion. 2. Small right pleural effusion. 3. COPD with biapical bullous changes. Electronically Signed   By: Elspeth Bathe M.D.   On: 08/12/2024 16:58   CT ANGIO GI BLEED Result Date: 08/12/2024 CLINICAL DATA:  Anemia, syncope and melena. EXAM: CTA ABDOMEN AND PELVIS WITHOUT  AND WITH CONTRAST TECHNIQUE: Multidetector CT imaging of the abdomen and pelvis was performed using the standard protocol during bolus administration of intravenous contrast. Multiplanar reconstructed images and MIPs were obtained and reviewed to evaluate the vascular anatomy. RADIATION DOSE REDUCTION: This exam was performed according to the departmental dose-optimization program which includes automated exposure control, adjustment of the mA and/or kV according to patient size and/or use of iterative reconstruction technique. CONTRAST:  OMNIPAQUE  IOHEXOL  350 MG/ML SOLN COMPARISON:  CT abdomen and pelvis 03/24/2016 FINDINGS: VASCULAR Aorta: Atherosclerosis of the abdominal aorta without aneurysm. Ulcerated plaque of the anterior infrarenal segment. Celiac: Calcified plaque causes approximately 50-60% origin stenosis. Distal branches are normally patent. SMA: Atherosclerosis of SMA trunk without significant stenosis. Renals: Heavily calcified plaque in proximal segments bilateral single renal arteries causes moderate stenoses of approximately 50-70%. The left renal artery almost immediately bifurcates beyond its origin. IMA: Patent small caliber IMA. Inflow: Heavily calcified bilateral iliac arteries with diffuse disease and no aneurysmal disease or obstruction. Proximal Outflow: Calcified common femoral arteries. Probable significant stenosis at the origin of the right SFA. Veins: Venous phase imaging demonstrates patent venous structures in the abdomen and pelvis without thrombus or other abnormalities. Review of the MIP images confirms the above findings. NON-VASCULAR Lower chest: No acute abnormality. Hepatobiliary: Potential early cirrhotic morphology of the liver. No hepatic masses identified. Status post cholecystectomy. No biliary dilatation. Pancreas: Unremarkable. No pancreatic ductal dilatation or surrounding inflammatory  changes. Spleen: Normal in size without focal abnormality. Adrenals/Urinary  Tract: Adrenal glands are unremarkable. Kidneys are normal, without renal calculi, focal lesion, or hydronephrosis. Bladder is unremarkable. Stomach/Bowel: Speckled high density material in the stomach is present on imaging prior to contrast administration. There is no evidence active bleeding on arterial or venous phases of imaging within the gastrointestinal tract. Bowel shows no evidence of obstruction, ileus, inflammation or lesion. The appendix is not discretely visualized. No free intraperitoneal air. Lymphatic: No enlarged abdominal or pelvic lymph nodes. Reproductive: Prostate is unremarkable. Other: No abdominal wall hernia or abnormality. No abdominopelvic ascites. Musculoskeletal: Mild loss of height of the T12 vertebral body. IMPRESSION: 1. No evidence of active bleeding within the gastrointestinal tract. 2. Atherosclerosis of the abdominal aorta and iliac arteries without aneurysm. Ulcerated plaque of the anterior infrarenal aorta. 3. 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 4. Moderate stenoses of approximately 50-70% in proximal bilateral renal arteries. 5. Probable significant stenosis at the origin of the right SFA. 6. Potential early cirrhotic morphology of the liver. 7. Mild loss of height of the T12 vertebral body. Aortic Atherosclerosis (ICD10-I70.0). Electronically Signed   By: Marcey Moan M.D.   On: 08/12/2024 16:21   DG Chest Port 1 View Result Date: 07/29/2024 CLINICAL DATA:  Congestive heart failure. EXAM: PORTABLE CHEST 1 VIEW COMPARISON:  07/24/2024 FINDINGS: The enteric tube has been removed. Post median sternotomy with stable cardiomegaly. Bilateral pleural effusions and bibasilar opacities, slightly worsened in the interim. Improved interstitial opacities with mild residual. Underlying emphysematous change. No pneumothorax. IMPRESSION: 1. Bilateral pleural effusions and bibasilar opacities, slightly worsened in the interim. 2. Improved interstitial opacities  with mild residual. Electronically Signed   By: Andrea Gasman M.D.   On: 07/29/2024 09:53   DG Chest 1 View Result Date: 07/24/2024 CLINICAL DATA:  [id number] Dyspnea [id number] EXAM: CHEST  1 VIEW COMPARISON:  July 23, 2024, July 11, 2024 FINDINGS: Esophagogastric tube terminates below the diaphragm, outside the field of view. Diffuse interstitial opacities throughout both lungs. No lobar consolidation or pneumothorax. Lateral right upper lobe nodular opacity measures 1.9 cm, corresponding to the lung nodule on the prior chest CT. Moderate cardiomegaly. Sternotomy wires and CABG markers. No acute fracture or destructive lesions. Multilevel thoracic osteophytosis. IMPRESSION: 1. Moderate cardiomegaly. Unchanged interstitial opacities throughout both lungs, possibly persistent interstitial edema and underlying emphysematous changes. 2. Similarly appearing lateral right upper lung zone nodular opacity. Please see the comparison chest CT for further characterization. Electronically Signed   By: Rogelia Myers M.D.   On: 07/24/2024 08:43    Microbiology: Results for orders placed or performed during the hospital encounter of 08/12/24  Blood culture (routine x 2)     Status: None   Collection Time: 08/12/24  3:07 PM   Specimen: BLOOD  Result Value Ref Range Status   Specimen Description BLOOD BLOOD RIGHT FOREARM  Final   Special Requests   Final    BOTTLES DRAWN AEROBIC AND ANAEROBIC Blood Culture results may not be optimal due to an inadequate volume of blood received in culture bottles   Culture   Final    NO GROWTH 5 DAYS Performed at [facility], [address], [place], [place] [address]    Report Status 08/17/2024 FINAL  Final  Blood culture (routine x 2)     Status: None   Collection Time: 08/12/24  3:07 PM   Specimen: BLOOD  Result Value Ref Range Status   Specimen Description BLOOD LEFT WRIST  Final   Special Requests   Final    BOTTLES DRAWN AEROBIC AND ANAEROBIC Blood Culture  results may not be optimal due to an inadequate volume of blood received in culture bottles   Culture   Final    NO GROWTH 5 DAYS Performed at [facility], [address] [place], [place] [address]    Report Status 08/17/2024 FINAL  Final  MRSA Next Gen by PCR, Nasal     Status: Abnormal   Collection Time: 08/13/24  7:51 AM   Specimen: Nasal Mucosa; Nasal Swab  Result Value Ref Range Status   MRSA by PCR Next Gen DETECTED (A) NOT DETECTED Final    Comment: RESULT CALLED TO, READ BACK BY AND VERIFIED WITH: DEBBIE PANKERTON 08/13/24 0930 MW (NOTE) The GeneXpert MRSA Assay (FDA approved for NASAL specimens only), is one component of a comprehensive MRSA colonization surveillance program. It is not intended to diagnose MRSA infection nor to guide or monitor treatment for MRSA infections. Test performance is not FDA approved in patients less than 53 years old. Performed at [facility], [address] [place], [place] [address]   Resp panel by RT-PCR (RSV, Flu A&B, Covid) Anterior Nasal Swab     Status: Abnormal   Collection Time: 08/13/24  7:51 AM   Specimen: Anterior Nasal Swab  Result Value Ref Range Status   SARS Coronavirus 2 by RT PCR POSITIVE (A) NEGATIVE Final    Comment: (NOTE) SARS-CoV-2 target nucleic acids are DETECTED.  The SARS-CoV-2 RNA is generally detectable in upper respiratory specimens during the acute phase of infection. Positive results are indicative of the presence of the identified virus, but do not rule out bacterial infection or co-infection with other pathogens not detected by the test. Clinical correlation with patient history and other diagnostic information is necessary to determine patient infection status. The expected result is Negative.  Fact Sheet for Patients: BloggerCourse.com  Fact Sheet for Healthcare Providers: SeriousBroker.it  This test is not yet approved or  cleared by the [place]  FDA and  has been authorized for detection and/or diagnosis of SARS-CoV-2 by FDA under an Emergency Use Authorization (EUA).  This EUA will remain in effect (meaning this test can be used) for the duration of  the COVID-19 declaration under Section 564(b)(1) of the A ct, 21 U.S.C. section 360bbb-3(b)(1), unless the authorization is terminated or revoked sooner.     Influenza A by PCR NEGATIVE NEGATIVE Final   Influenza B by PCR NEGATIVE NEGATIVE Final    Comment: (NOTE) The Xpert Xpress SARS-CoV-2/FLU/RSV plus assay is intended as an aid in the diagnosis of influenza from Nasopharyngeal swab specimens and should not be used as a sole basis for treatment. Nasal washings and aspirates are unacceptable for Xpert Xpress SARS-CoV-2/FLU/RSV testing.  Fact Sheet for Patients: BloggerCourse.com  Fact Sheet for Healthcare Providers: SeriousBroker.it  This test is not yet approved or cleared by the [place]  FDA and has been authorized for detection and/or diagnosis of SARS-CoV-2 by FDA under an Emergency Use Authorization (EUA). This EUA will remain in effect (meaning this test can be used) for the duration of the COVID-19 declaration under Section 564(b)(1) of the Act, 21 U.S.C. section 360bbb-3(b)(1), unless the authorization is terminated or revoked.     Resp Syncytial Virus by PCR NEGATIVE NEGATIVE Final    Comment: (NOTE) Fact Sheet for Patients: BloggerCourse.com  Fact Sheet for Healthcare Providers: SeriousBroker.it  This test is not yet approved or cleared by the [place]  [place] FDA and has been authorized for detection and/or diagnosis of SARS-CoV-2 by FDA under an Emergency Use Authorization (EUA). This EUA will remain in effect (meaning this test can be used) for the duration of the COVID-19 declaration under Section 564(b)(1) of the Act, 21  U.S.C. section 360bbb-3(b)(1), unless the authorization is terminated or revoked.  Performed at [facility], [address] [place], [place] [address]   Respiratory (~20 pathogens) panel by PCR     Status: None   Collection Time: 08/13/24  7:51 AM   Specimen: Nasopharyngeal Swab; Respiratory  Result Value Ref Range Status   Adenovirus NOT DETECTED NOT DETECTED Final   Coronavirus 229E NOT DETECTED NOT DETECTED Final    Comment: (NOTE) The Coronavirus on the Respiratory Panel, DOES NOT test for the novel  Coronavirus (2019 nCoV)    Coronavirus HKU1 NOT DETECTED NOT DETECTED Final   Coronavirus NL63 NOT DETECTED NOT DETECTED Final   Coronavirus OC43 NOT DETECTED NOT DETECTED Final   Metapneumovirus NOT DETECTED NOT DETECTED Final   Rhinovirus / Enterovirus NOT DETECTED NOT DETECTED Final   Influenza A NOT DETECTED NOT DETECTED Final   Influenza B NOT DETECTED NOT DETECTED Final   Parainfluenza Virus 1 NOT DETECTED NOT DETECTED Final   Parainfluenza Virus 2 NOT DETECTED NOT DETECTED Final   Parainfluenza Virus 3 NOT DETECTED NOT DETECTED Final   Parainfluenza Virus 4 NOT DETECTED NOT DETECTED Final   Respiratory Syncytial Virus NOT DETECTED NOT DETECTED Final   Bordetella pertussis NOT DETECTED NOT DETECTED Final   Bordetella Parapertussis NOT DETECTED NOT DETECTED Final   Chlamydophila pneumoniae NOT DETECTED NOT DETECTED Final   Mycoplasma pneumoniae NOT DETECTED NOT DETECTED Final    Comment: Performed at [facility], [address], [place], [place] [address]    Labs: CBC: Recent Labs  Lab 08/16/24 0600 08/17/24 9341 08/18/24 0511 08/19/24 0555 08/20/24 0620 08/21/24 0541 08/22/24 0737  WBC 15.4* 9.6  --   --   --   --  19.6*  HGB 9.4* 9.8* 9.5* 9.7* 9.4* 9.8* 9.6*  HCT 30.5* 31.9*  --   --  31.0*  --  31.9*  MCV 91.9 92.5  --   --   --   --  95.5  PLT 278 219  --   --   --   --  257   Basic Metabolic Panel: Recent Labs  Lab 08/16/24 0600  08/17/24 0658 08/19/24 0555 08/20/24 0620 08/21/24 0541 08/22/24 0737  NA 135 135 137 138 138 137  K 4.1 4.6 4.4 4.3 4.2 4.3  CL 97* 97* 98 101 98 96*  CO2 29 28 30 29 31 31   GLUCOSE 109* 173* 154* 173* 120* 135*  BUN 30* 30* 29* 29* 30* 37*  CREATININE 1.36* 1.37* 1.42* 1.15 1.12 1.22  CALCIUM  8.5* 8.4* 8.5* 8.4* 8.2* 8.3*  PHOS 3.2 2.9  --   --   --   --    Liver Function Tests: No results for input(s): AST, ALT, ALKPHOS, BILITOT, PROT, ALBUMIN  in the last 168 hours. CBG: Recent Labs  Lab 08/21/24 1105 08/21/24 1617 08/21/24 2012 08/22/24 0829 08/22/24 1109  GLUCAP 96 219* 212* 140* 175*    Discharge time spent: greater than 30 minutes.  Signed: Laree Lock, MD [facility] 08/22/2024

## 2024-08-22 NOTE — TOC Progression Note (Signed)
 Transition of Care [facility]) - Progression Note    Patient Details  Name: [name] MRN: [id number] Date of Birth: 09/10/1961  Transition of Care [facility]) CM/SW Contact  [name], LCSW Phone Number: 08/22/2024, 2:29 PM  Clinical Narrative:  Patient said he already has a RW, BSC, and shower stool at home. CSW added phone number to AVS to call and get set up with Medicaid Transportation. Patient is aware.     Barriers to Discharge: Continued Medical Work up               Expected Discharge Plan and Services       Living arrangements for the past 2 months: Skilled Nursing Facility, Single Family Home Expected Discharge Date: 08/19/24                                     Social Drivers of Health (SDOH) Interventions SDOH Screenings   Food Insecurity: No Food Insecurity (08/13/2024)  Housing: Low Risk  (08/13/2024)  Transportation Needs: No Transportation Needs (08/13/2024)  Utilities: Not At Risk (08/13/2024)  Social Connections: Socially Isolated (08/13/2024)  Tobacco Use: High Risk (08/12/2024)    Readmission Risk Interventions    08/13/2024   12:01 PM 05/14/2024    8:44 AM 10/27/2022   11:28 AM  Readmission Risk Prevention Plan  Transportation Screening Complete  Complete  PCP or Specialist Appt within 5-7 Days   Complete  PCP or Specialist Appt within 3-5 Days  Complete   Home Care Screening   Complete  Medication Review (RN CM)   Complete  HRI or Home Care Consult  Complete   Social Work Consult for Recovery Care Planning/Counseling  Complete   Palliative Care Screening  Not Applicable   Medication Review Oceanographer) Complete Complete   Palliative Care Screening Not Applicable    Skilled Nursing Facility Complete

## 2024-08-22 NOTE — TOC Transition Note (Signed)
 Transition of Care [facility]) - Discharge Note   Patient Details  Name: [name] MRN: [id number] Date of Birth: 12/05/1961  Transition of Care [facility]) CM/SW Contact:  [name], LCSW Phone Number: 08/22/2024, 3:27 PM   Clinical Narrative:   Patient has orders to discharge home today. Left message for [facility] liaison to notify. No further concerns. CSW signing off.  Final next level of care: Home w Home Health Services Barriers to Discharge: Barriers Resolved   Patient Goals and CMS Choice     Choice offered to / list presented to : Patient      Discharge Placement                Patient to be transferred to facility by: Friend   Patient and family notified of of transfer: 08/22/24  Discharge Plan and Services Additional resources added to the After Visit Summary for                            [facility] Arranged: RN, PT, OT [facility] Agency: [facility] Date [facility] Agency Contacted: 08/22/24   Representative spoke with at [facility] Agency: [place]   Social Drivers of Health (SDOH) Interventions SDOH Screenings   Food Insecurity: No Food Insecurity (08/13/2024)  Housing: Low Risk  (08/13/2024)  Transportation Needs: No Transportation Needs (08/13/2024)  Utilities: Not At Risk (08/13/2024)  Social Connections: Socially Isolated (08/13/2024)  Tobacco Use: High Risk (08/12/2024)     Readmission Risk Interventions    08/13/2024   12:01 PM 05/14/2024    8:44 AM 10/27/2022   11:28 AM  Readmission Risk Prevention Plan  Transportation Screening Complete  Complete  PCP or Specialist Appt within 5-7 Days   Complete  PCP or Specialist Appt within 3-5 Days  Complete   Home Care Screening   Complete  Medication Review (RN CM)   Complete  HRI or Home Care Consult  Complete   Social Work Consult for Recovery Care Planning/Counseling  Complete   Palliative Care Screening  Not Applicable   Medication Review Oceanographer) Complete Complete   Palliative Care  Screening Not Applicable    Skilled Nursing Facility Complete

## 2024-08-22 NOTE — Progress Notes (Signed)
 Physical Therapy Treatment Patient Details Name: [name] MRN: [id number] DOB: 12/29/1960 Today's Date: 08/22/2024   History of Present Illness 63 y.o. male with medical history significant of  GIB, alcohol  abuse, COPD on 2 L oxygen, hypertension, CAD with prior CABG and recent Stent placement, HLD, stroke, dementia, dCHF, presents with SOB, cough, chest pain.     Patient was recently hospitalized from 7/25 - 8/14 and d/c'd to rehab facility.  Returns after syncopal epsisode with weakness and GI bleed - recieved blood, found to be Covid +    PT Comments  Patient continues to progress well towards physical therapy goals. On 2L O2 throughout session, spO2 >92% with activity. Ambulated 80' x 2 with RW and supervision. Seated rest breaks between bouts. Sit to stand x 5 from recliner. Discharge plan remains appropriate.     If plan is discharge home, recommend the following: A little help with walking and/or transfers;A lot of help with bathing/dressing/bathroom;Assistance with cooking/housework;Assist for transportation;Help with stairs or ramp for entrance   Can travel by private vehicle        Equipment Recommendations  Rolling Kamsiyochukwu Spickler (2 wheels)    Recommendations for Other Services       Precautions / Restrictions Precautions Precautions: Fall Recall of Precautions/Restrictions: Intact Restrictions Weight Bearing Restrictions Per Provider Order: No     Mobility  Bed Mobility               General bed mobility comments: in recliner on arrival    Transfers Overall transfer level: Needs assistance Equipment used: Rolling Yuliya Nova (2 wheels) Transfers: Sit to/from Stand Sit to Stand: Supervision                Ambulation/Gait Ambulation/Gait assistance: Supervision Gait Distance (Feet): 80 Feet (+80') Assistive device: Rolling Harvie Morua (2 wheels) Gait Pattern/deviations: Step-through pattern, Decreased step length - right, Decreased step length - left Gait  velocity: decreased     General Gait Details: completes 2x80' in room. Requires seated rest b/t sets due to fatigue and SOB. SPO2 > 90% on 2 L/min   Stairs             Wheelchair Mobility     Tilt Bed    Modified Rankin (Stroke Patients Only)       Balance Overall balance assessment: Mild deficits observed, not formally tested                                          Communication Communication Communication: Impaired Factors Affecting Communication: Hearing impaired  Cognition Arousal: Alert Behavior During Therapy: WFL for tasks assessed/performed   PT - Cognitive impairments: No apparent impairments                         Following commands: Intact      Cueing    Exercises Other Exercises Other Exercises: Sit to stand x 5 with B UE support    General Comments        Pertinent Vitals/Pain Pain Assessment Pain Assessment: No/denies pain    Home Living                          Prior Function            PT Goals (current goals can now be found in the care plan section) Acute  Rehab PT Goals Patient Stated Goal: to get stronger PT Goal Formulation: With patient Time For Goal Achievement: 08/26/24 Potential to Achieve Goals: Good Progress towards PT goals: Progressing toward goals    Frequency    Min 2X/week      PT Plan      Co-evaluation              AM-PAC PT 6 Clicks Mobility   Outcome Measure  Help needed turning from your back to your side while in a flat bed without using bedrails?: A Little Help needed moving from lying on your back to sitting on the side of a flat bed without using bedrails?: A Little Help needed moving to and from a bed to a chair (including a wheelchair)?: A Little Help needed standing up from a chair using your arms (e.g., wheelchair or bedside chair)?: A Little Help needed to walk in hospital room?: A Little Help needed climbing 3-5 steps with a railing? :  A Lot 6 Click Score: 17    End of Session Equipment Utilized During Treatment: Oxygen Activity Tolerance: Patient tolerated treatment well Patient left: in chair;with call bell/phone within reach;with chair alarm set Nurse Communication: Mobility status PT Visit Diagnosis: Muscle weakness (generalized) (M62.81);Difficulty in walking, not elsewhere classified (R26.2);Unsteadiness on feet (R26.81)     Time: 8553-8493 PT Time Calculation (min) (ACUTE ONLY): 20 min  Charges:    $Therapeutic Activity: 8-22 mins PT General Charges $$ ACUTE PT VISIT: 1 Visit                     Maryanne Finder, PT, DPT Physical Therapist - [facility]  [facility]    Darlinda Bellows A Ekin Pilar 08/22/2024, 4:08 PM

## 2024-08-22 NOTE — Plan of Care (Signed)

## 2024-08-22 NOTE — Plan of Care (Signed)

## 2024-08-26 ENCOUNTER — Other Ambulatory Visit: Payer: Self-pay

## 2024-09-10 ENCOUNTER — Encounter (INDEPENDENT_AMBULATORY_CARE_PROVIDER_SITE_OTHER)

## 2024-09-10 ENCOUNTER — Ambulatory Visit (INDEPENDENT_AMBULATORY_CARE_PROVIDER_SITE_OTHER): Admitting: Nurse Practitioner

## 2024-09-10 ENCOUNTER — Ambulatory Visit: Admitting: Student in an Organized Health Care Education/Training Program

## 2024-09-16 ENCOUNTER — Other Ambulatory Visit: Payer: Self-pay

## 2024-09-16 ENCOUNTER — Inpatient Hospital Stay
Admission: EM | Admit: 2024-09-16 | Discharge: 2024-09-24 | DRG: 377 | Disposition: A | Discharge status: Home Health | Attending: Internal Medicine | Admitting: Internal Medicine

## 2024-09-16 ENCOUNTER — Encounter: Payer: Self-pay | Admitting: Internal Medicine

## 2024-09-16 ENCOUNTER — Emergency Department

## 2024-09-16 DIAGNOSIS — D500 Iron deficiency anemia secondary to blood loss (chronic): Secondary | ICD-10-CM | POA: Diagnosis present

## 2024-09-16 DIAGNOSIS — N179 Acute kidney failure, unspecified: Secondary | ICD-10-CM | POA: Diagnosis present

## 2024-09-16 DIAGNOSIS — I509 Heart failure, unspecified: Principal | ICD-10-CM

## 2024-09-16 DIAGNOSIS — I252 Old myocardial infarction: Secondary | ICD-10-CM

## 2024-09-16 DIAGNOSIS — K922 Gastrointestinal hemorrhage, unspecified: Secondary | ICD-10-CM | POA: Diagnosis present

## 2024-09-16 DIAGNOSIS — Z7902 Long term (current) use of antithrombotics/antiplatelets: Secondary | ICD-10-CM | POA: Diagnosis not present

## 2024-09-16 DIAGNOSIS — R195 Other fecal abnormalities: Secondary | ICD-10-CM | POA: Diagnosis present

## 2024-09-16 DIAGNOSIS — Z6831 Body mass index (BMI) 31.0-31.9, adult: Secondary | ICD-10-CM

## 2024-09-16 DIAGNOSIS — I701 Atherosclerosis of renal artery: Secondary | ICD-10-CM | POA: Diagnosis present

## 2024-09-16 DIAGNOSIS — J90 Pleural effusion, not elsewhere classified: Secondary | ICD-10-CM | POA: Diagnosis present

## 2024-09-16 DIAGNOSIS — R578 Other shock: Secondary | ICD-10-CM | POA: Diagnosis present

## 2024-09-16 DIAGNOSIS — R7989 Other specified abnormal findings of blood chemistry: Secondary | ICD-10-CM | POA: Diagnosis present

## 2024-09-16 DIAGNOSIS — J449 Chronic obstructive pulmonary disease, unspecified: Secondary | ICD-10-CM | POA: Diagnosis present

## 2024-09-16 DIAGNOSIS — I110 Hypertensive heart disease with heart failure: Secondary | ICD-10-CM | POA: Diagnosis present

## 2024-09-16 DIAGNOSIS — J9611 Chronic respiratory failure with hypoxia: Secondary | ICD-10-CM | POA: Diagnosis present

## 2024-09-16 DIAGNOSIS — I700 Atherosclerosis of aorta: Secondary | ICD-10-CM | POA: Diagnosis not present

## 2024-09-16 DIAGNOSIS — Z8673 Personal history of transient ischemic attack (TIA), and cerebral infarction without residual deficits: Secondary | ICD-10-CM

## 2024-09-16 DIAGNOSIS — I493 Ventricular premature depolarization: Secondary | ICD-10-CM | POA: Diagnosis present

## 2024-09-16 DIAGNOSIS — K5520 Angiodysplasia of colon without hemorrhage: Secondary | ICD-10-CM | POA: Diagnosis present

## 2024-09-16 DIAGNOSIS — I7102 Dissection of abdominal aorta: Secondary | ICD-10-CM | POA: Diagnosis not present

## 2024-09-16 DIAGNOSIS — Z7982 Long term (current) use of aspirin: Secondary | ICD-10-CM

## 2024-09-16 DIAGNOSIS — K219 Gastro-esophageal reflux disease without esophagitis: Secondary | ICD-10-CM | POA: Diagnosis present

## 2024-09-16 DIAGNOSIS — I25810 Atherosclerosis of coronary artery bypass graft(s) without angina pectoris: Secondary | ICD-10-CM | POA: Diagnosis present

## 2024-09-16 DIAGNOSIS — I5033 Acute on chronic diastolic (congestive) heart failure: Secondary | ICD-10-CM | POA: Diagnosis present

## 2024-09-16 DIAGNOSIS — I2489 Other forms of acute ischemic heart disease: Secondary | ICD-10-CM | POA: Diagnosis present

## 2024-09-16 DIAGNOSIS — I2510 Atherosclerotic heart disease of native coronary artery without angina pectoris: Secondary | ICD-10-CM | POA: Diagnosis present

## 2024-09-16 DIAGNOSIS — F32A Depression, unspecified: Secondary | ICD-10-CM | POA: Diagnosis present

## 2024-09-16 DIAGNOSIS — R1030 Lower abdominal pain, unspecified: Secondary | ICD-10-CM | POA: Diagnosis present

## 2024-09-16 DIAGNOSIS — I350 Nonrheumatic aortic (valve) stenosis: Secondary | ICD-10-CM | POA: Diagnosis present

## 2024-09-16 DIAGNOSIS — E785 Hyperlipidemia, unspecified: Secondary | ICD-10-CM | POA: Diagnosis present

## 2024-09-16 DIAGNOSIS — Z604 Social exclusion and rejection: Secondary | ICD-10-CM | POA: Diagnosis present

## 2024-09-16 DIAGNOSIS — E876 Hypokalemia: Secondary | ICD-10-CM | POA: Diagnosis present

## 2024-09-16 DIAGNOSIS — Z888 Allergy status to other drugs, medicaments and biological substances status: Secondary | ICD-10-CM

## 2024-09-16 DIAGNOSIS — R5381 Other malaise: Secondary | ICD-10-CM | POA: Diagnosis present

## 2024-09-16 DIAGNOSIS — I10 Essential (primary) hypertension: Secondary | ICD-10-CM | POA: Diagnosis present

## 2024-09-16 DIAGNOSIS — Z87891 Personal history of nicotine dependence: Secondary | ICD-10-CM | POA: Diagnosis not present

## 2024-09-16 DIAGNOSIS — M7989 Other specified soft tissue disorders: Secondary | ICD-10-CM | POA: Diagnosis present

## 2024-09-16 DIAGNOSIS — Z79899 Other long term (current) drug therapy: Secondary | ICD-10-CM | POA: Diagnosis not present

## 2024-09-16 DIAGNOSIS — Z9981 Dependence on supplemental oxygen: Secondary | ICD-10-CM | POA: Diagnosis not present

## 2024-09-16 DIAGNOSIS — Q254 Congenital malformation of aorta unspecified: Secondary | ICD-10-CM | POA: Insufficient documentation

## 2024-09-16 DIAGNOSIS — I5041 Acute combined systolic (congestive) and diastolic (congestive) heart failure: Secondary | ICD-10-CM | POA: Diagnosis not present

## 2024-09-16 DIAGNOSIS — G629 Polyneuropathy, unspecified: Secondary | ICD-10-CM | POA: Diagnosis present

## 2024-09-16 DIAGNOSIS — Z720 Tobacco use: Secondary | ICD-10-CM

## 2024-09-16 DIAGNOSIS — D649 Anemia, unspecified: Secondary | ICD-10-CM

## 2024-09-16 LAB — URINALYSIS, W/ REFLEX TO CULTURE (INFECTION SUSPECTED)
Bilirubin Urine: NEGATIVE
Glucose, UA: 150 mg/dL — AB
Hgb urine dipstick: NEGATIVE
Ketones, ur: NEGATIVE mg/dL
Leukocytes,Ua: NEGATIVE
Nitrite: NEGATIVE
Protein, ur: NEGATIVE mg/dL
Specific Gravity, Urine: 1.014 (ref 1.005–1.030)
Squamous Epithelial / HPF: 0 /HPF (ref 0–5)
pH: 7 (ref 5.0–8.0)

## 2024-09-16 LAB — COMPREHENSIVE METABOLIC PANEL WITH GFR
ALT: 7 U/L (ref 0–44)
AST: 16 U/L (ref 15–41)
Albumin: 3.1 g/dL — ABNORMAL LOW (ref 3.5–5.0)
Alkaline Phosphatase: 60 U/L (ref 38–126)
Anion gap: 10 (ref 5–15)
BUN: 17 mg/dL (ref 8–23)
CO2: 37 mmol/L — ABNORMAL HIGH (ref 22–32)
Calcium: 8.4 mg/dL — ABNORMAL LOW (ref 8.9–10.3)
Chloride: 89 mmol/L — ABNORMAL LOW (ref 98–111)
Creatinine, Ser: 1.41 mg/dL — ABNORMAL HIGH (ref 0.61–1.24)
GFR, Estimated: 55 mL/min — ABNORMAL LOW (ref >60)
Glucose, Bld: 103 mg/dL — ABNORMAL HIGH (ref 70–99)
Potassium: 3.5 mmol/L (ref 3.5–5.1)
Sodium: 136 mmol/L (ref 135–145)
Total Bilirubin: 1.2 mg/dL (ref 0.0–1.2)
Total Protein: 7.7 g/dL (ref 6.5–8.1)

## 2024-09-16 LAB — CBC WITH DIFFERENTIAL/PLATELET
Abs Immature Granulocytes: 0.03 K/uL (ref 0.00–0.07)
Basophils Absolute: 0.1 K/uL (ref 0.0–0.1)
Basophils Relative: 1 %
Eosinophils Absolute: 0.2 K/uL (ref 0.0–0.5)
Eosinophils Relative: 3 %
HCT: 26.1 % — ABNORMAL LOW (ref 39.0–52.0)
Hemoglobin: 7.6 g/dL — ABNORMAL LOW (ref 13.0–17.0)
Immature Granulocytes: 0 %
Lymphocytes Relative: 10 %
Lymphs Abs: 0.9 K/uL (ref 0.7–4.0)
MCH: 27.4 pg (ref 26.0–34.0)
MCHC: 29.1 g/dL — ABNORMAL LOW (ref 30.0–36.0)
MCV: 94.2 fL (ref 80.0–100.0)
Monocytes Absolute: 1.3 K/uL — ABNORMAL HIGH (ref 0.1–1.0)
Monocytes Relative: 14 %
Neutro Abs: 6.5 K/uL (ref 1.7–7.7)
Neutrophils Relative %: 72 %
Platelets: 354 K/uL (ref 150–400)
RBC: 2.77 MIL/uL — ABNORMAL LOW (ref 4.22–5.81)
RDW: 16.4 % — ABNORMAL HIGH (ref 11.5–15.5)
WBC: 9 K/uL (ref 4.0–10.5)
nRBC: 0 % (ref 0.0–0.2)

## 2024-09-16 LAB — TROPONIN I (HIGH SENSITIVITY)
Troponin I (High Sensitivity): 30 ng/L — ABNORMAL HIGH (ref <18)
Troponin I (High Sensitivity): 29 ng/L — ABNORMAL HIGH (ref <18)

## 2024-09-16 LAB — BRAIN NATRIURETIC PEPTIDE: B Natriuretic Peptide: 1583.6 pg/mL — ABNORMAL HIGH (ref 0.0–100.0)

## 2024-09-16 LAB — PROTIME-INR
INR: 1.2 (ref 0.8–1.2)
Prothrombin Time: 16.1 s — ABNORMAL HIGH (ref 11.4–15.2)

## 2024-09-16 LAB — PREPARE RBC (CROSSMATCH)

## 2024-09-16 MED ORDER — THIAMINE MONONITRATE 100 MG PO TABS
100.0000 mg | ORAL_TABLET | Freq: Every day | ORAL | Status: DC
  Administered 2024-09-17 – 2024-09-24 (×8): 100 mg via ORAL
  Filled 2024-09-16 (×8): qty 1

## 2024-09-16 MED ORDER — VITAMIN C 500 MG PO TABS
500.0000 mg | ORAL_TABLET | Freq: Every day | ORAL | Status: DC
  Administered 2024-09-17 – 2024-09-24 (×8): 500 mg via ORAL
  Filled 2024-09-16 (×8): qty 1

## 2024-09-16 MED ORDER — POLYETHYLENE GLYCOL 3350 17 GM/SCOOP PO POWD
17.0000 g | Freq: Every day | ORAL | Status: DC
  Filled 2024-09-16 (×3): qty 119

## 2024-09-16 MED ORDER — ONDANSETRON HCL 4 MG PO TABS: 4.0000 mg | ORAL_TABLET | Freq: Four times a day (QID) | ORAL | Status: AC | PRN

## 2024-09-16 MED ORDER — POLYETHYLENE GLYCOL 3350 17 G PO PACK
17.0000 g | PACK | Freq: Every day | ORAL | Status: DC
  Filled 2024-09-16: qty 1

## 2024-09-16 MED ORDER — PANTOPRAZOLE SODIUM 40 MG IV SOLR
80.0000 mg | Freq: Once | INTRAVENOUS | Status: AC
  Administered 2024-09-16: 80 mg via INTRAVENOUS
  Filled 2024-09-16: qty 20

## 2024-09-16 MED ORDER — ISOSORBIDE MONONITRATE ER 30 MG PO TB24
30.0000 mg | ORAL_TABLET | Freq: Every day | ORAL | Status: DC
  Administered 2024-09-17 – 2024-09-24 (×8): 30 mg via ORAL
  Filled 2024-09-16 (×8): qty 1

## 2024-09-16 MED ORDER — FOLIC ACID 1 MG PO TABS
1.0000 mg | ORAL_TABLET | Freq: Every day | ORAL | Status: DC
  Administered 2024-09-17 – 2024-09-24 (×8): 1 mg via ORAL
  Filled 2024-09-16 (×8): qty 1

## 2024-09-16 MED ORDER — DM-GUAIFENESIN ER 30-600 MG PO TB12
1.0000 | ORAL_TABLET | Freq: Two times a day (BID) | ORAL | Status: DC | PRN
  Administered 2024-09-22 – 2024-09-23 (×3): 1 via ORAL
  Filled 2024-09-16 (×4): qty 1

## 2024-09-16 MED ORDER — METOPROLOL SUCCINATE ER 25 MG PO TB24
25.0000 mg | ORAL_TABLET | Freq: Every day | ORAL | Status: DC
  Administered 2024-09-17 – 2024-09-24 (×8): 25 mg via ORAL
  Filled 2024-09-16 (×8): qty 1

## 2024-09-16 MED ORDER — GABAPENTIN 300 MG PO CAPS
300.0000 mg | ORAL_CAPSULE | Freq: Two times a day (BID) | ORAL | Status: DC
  Administered 2024-09-16 – 2024-09-17 (×2): 300 mg via ORAL
  Filled 2024-09-16 (×2): qty 1

## 2024-09-16 MED ORDER — AMIODARONE HCL 200 MG PO TABS
200.0000 mg | ORAL_TABLET | Freq: Every day | ORAL | Status: DC
Start: 2024-09-17 — End: 2024-09-24
  Administered 2024-09-17 – 2024-09-24 (×8): 200 mg via ORAL
  Filled 2024-09-16 (×8): qty 1

## 2024-09-16 MED ORDER — IOHEXOL 350 MG/ML SOLN
100.0000 mL | Freq: Once | INTRAVENOUS | Status: AC | PRN
  Administered 2024-09-16: 100 mL via INTRAVENOUS

## 2024-09-16 MED ORDER — ADULT MULTIVITAMIN W/MINERALS CH
1.0000 | ORAL_TABLET | Freq: Every day | ORAL | Status: DC
  Administered 2024-09-17 – 2024-09-24 (×8): 1 via ORAL
  Filled 2024-09-16 (×8): qty 1

## 2024-09-16 MED ORDER — DAPAGLIFLOZIN PROPANEDIOL 10 MG PO TABS
10.0000 mg | ORAL_TABLET | Freq: Every day | ORAL | Status: DC
  Administered 2024-09-17 – 2024-09-24 (×8): 10 mg via ORAL
  Filled 2024-09-16 (×8): qty 1

## 2024-09-16 MED ORDER — SENNOSIDES-DOCUSATE SODIUM 8.6-50 MG PO TABS: 1.0000 | ORAL_TABLET | Freq: Every evening | ORAL | Status: DC | PRN

## 2024-09-16 MED ORDER — BUDESON-GLYCOPYRROL-FORMOTEROL 160-9-4.8 MCG/ACT IN AERO
2.0000 | INHALATION_SPRAY | Freq: Two times a day (BID) | RESPIRATORY_TRACT | Status: DC
  Administered 2024-09-16 – 2024-09-24 (×16): 2 via RESPIRATORY_TRACT
  Filled 2024-09-16: qty 5.9

## 2024-09-16 MED ORDER — FUROSEMIDE 10 MG/ML IJ SOLN
40.0000 mg | Freq: Once | INTRAMUSCULAR | Status: AC
  Administered 2024-09-16: 40 mg via INTRAVENOUS
  Filled 2024-09-16: qty 4

## 2024-09-16 MED ORDER — NICOTINE 14 MG/24HR TD PT24
14.0000 mg | MEDICATED_PATCH | Freq: Every day | TRANSDERMAL | Status: DC
Start: 2024-09-17 — End: 2024-09-16

## 2024-09-16 MED ORDER — SODIUM CHLORIDE 0.9% IV SOLUTION: 250.0000 mL | Freq: Once | INTRAVENOUS | Status: DC

## 2024-09-16 MED ORDER — FUROSEMIDE 10 MG/ML IJ SOLN
40.0000 mg | Freq: Two times a day (BID) | INTRAMUSCULAR | Status: AC
  Administered 2024-09-16 – 2024-09-17 (×2): 40 mg via INTRAVENOUS
  Filled 2024-09-16 (×2): qty 4

## 2024-09-16 MED ORDER — LACTULOSE 10 GM/15ML PO SOLN: 20.0000 g | Freq: Every day | ORAL | Status: DC | PRN

## 2024-09-16 MED ORDER — PANTOPRAZOLE SODIUM 40 MG IV SOLR
40.0000 mg | Freq: Two times a day (BID) | INTRAVENOUS | Status: DC
Start: 2024-09-16 — End: 2024-09-21
  Administered 2024-09-16 – 2024-09-20 (×9): 40 mg via INTRAVENOUS
  Filled 2024-09-16 (×9): qty 10

## 2024-09-16 MED ORDER — BISACODYL 5 MG PO TBEC: 5.0000 mg | DELAYED_RELEASE_TABLET | Freq: Every day | ORAL | Status: DC | PRN

## 2024-09-16 MED ORDER — ROSUVASTATIN CALCIUM 10 MG PO TABS
20.0000 mg | ORAL_TABLET | Freq: Every day | ORAL | Status: DC
  Administered 2024-09-16 – 2024-09-23 (×8): 20 mg via ORAL
  Filled 2024-09-16 (×8): qty 2
  Filled 2024-09-16: qty 1

## 2024-09-16 MED ORDER — POLYETHYLENE GLYCOL 3350 17 G PO PACK
17.0000 g | PACK | Freq: Every day | ORAL | Status: DC
  Administered 2024-09-17 – 2024-09-24 (×8): 17 g via ORAL
  Filled 2024-09-16 (×8): qty 1

## 2024-09-16 MED ORDER — ALBUTEROL SULFATE (2.5 MG/3ML) 0.083% IN NEBU
3.0000 mL | INHALATION_SOLUTION | Freq: Four times a day (QID) | RESPIRATORY_TRACT | Status: DC | PRN
  Administered 2024-09-21 – 2024-09-22 (×2): 3 mL via RESPIRATORY_TRACT
  Filled 2024-09-16 (×2): qty 3

## 2024-09-16 MED ORDER — NICOTINE 14 MG/24HR TD PT24
14.0000 mg | MEDICATED_PATCH | Freq: Every day | TRANSDERMAL | Status: DC
  Administered 2024-09-16 – 2024-09-23 (×8): 14 mg via TRANSDERMAL
  Filled 2024-09-16 (×8): qty 1

## 2024-09-16 MED ORDER — ACETAMINOPHEN 325 MG PO TABS
650.0000 mg | ORAL_TABLET | Freq: Four times a day (QID) | ORAL | Status: AC | PRN
  Administered 2024-09-17 – 2024-09-20 (×4): 650 mg via ORAL
  Filled 2024-09-16 (×4): qty 2

## 2024-09-16 MED ORDER — ACETAMINOPHEN 650 MG RE SUPP: 650.0000 mg | Freq: Four times a day (QID) | RECTAL | Status: AC | PRN

## 2024-09-16 MED ORDER — ONDANSETRON HCL 4 MG/2ML IJ SOLN: 4.0000 mg | Freq: Four times a day (QID) | INTRAMUSCULAR | Status: AC | PRN

## 2024-09-16 MED ORDER — FE FUM-VIT C-VIT B12-FA 460-60-0.01-1 MG PO CAPS
1.0000 | ORAL_CAPSULE | Freq: Two times a day (BID) | ORAL | Status: DC
  Administered 2024-09-16 – 2024-09-24 (×16): 1 via ORAL
  Filled 2024-09-16 (×18): qty 1

## 2024-09-16 NOTE — ED Notes (Signed)
 Pt assisted to bedside to urinate. .

## 2024-09-16 NOTE — Assessment & Plan Note (Signed)
 Home Imdur  30 mg daily, metoprolol  succinate 25 mg daily, were resumed on admission

## 2024-09-16 NOTE — Assessment & Plan Note (Signed)
 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

## 2024-09-16 NOTE — Assessment & Plan Note (Signed)
 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 team to consider consultation to GI if indicated Protonix  40 mg IV twice daily initiated on admission Check CBC in the a.m.

## 2024-09-16 NOTE — Assessment & Plan Note (Signed)
 Downtrending which is reassuring Patient denies chest pain, and EKG is negative for acute ischemic change

## 2024-09-16 NOTE — H&P (Addendum)
 History and Physical   [name] FMW:[id number] DOB: 1961-08-02 DOA: 09/16/2024  PCP: Osa Geralds, NP  Patient coming from: Home via EMS  I have personally briefly reviewed patient's old medical records in [facility] EMR.  Chief Concern: Shortness of breath, leg swelling  HPI: [name] is a 63 year old male with history of heart failure preserved ejection fraction, morbid obesity, hypertension, neuropathy, insomnia, hyperlipidemia, coronary artery disease, GERD, chronic oxygen requirement on 2 L nasal cannula, who presents ED for chief concerns of bilateral lower extremity swelling, dizziness, abdominal pain.  Vitals in the ED showed T of 98.5, rr 22, hr 79, blood pressure 104/62, SpO2 99% on 2 L nasal cannula.  Serum sodium is 136, potassium 3.5, chloride 89, bicarb 37, BUN of 17, serum creatinine 1.41, eGFR 55, nonfasting glucose 103, WBC 9.0, hemoglobin 7.6, platelets of 354.  BNP elevated 1583.6.  HS troponin was 30 and on repeat is 29.  UA was negative for leukocytes and nitrates.  ED treatment: Patient is typed and screened, 1 unit of blood ordered for transfusion.  Furosemide  40 mg IV one-time dose, Protonix  80 mg IV one-time dose. ---------------------------------- At bedside, patient was able to tell me his first last name, age, location, current calendar year.  He reports that at midnight this morning, he endorsed abdominal pain that was sharp and on a scale of 0-10, he states it was a 10 out of 10 and the pain was persistent.  Currently he states the pain is 1 out of 10.  He denies trauma to his person.  He reports the pain finally resolved in the emergency room after given furosemide  and he started urinating.  He reports bilateral lower extremity swelling that has been ongoing since his last discharge in the hospital.  He also endorses dizziness that has been persistent and intermittent.  He denies fever, chills, nausea, vomiting, dysuria, hematuria,  diarrhea, blood in his stool.  He reports that the his stool has been green in color since he is on iron  tablets.  He reports no black stool.  He denies chest pain.  Reports shortness of breath is worse with exertion  Social history: He lives at home alone.  Denies cigarette smoking.  He endorses chewing tobacco, 1 container per day.  He denies EtOH.  He reports he has not had an alcoholic drink in 3 months.  He denies recreational drug use.  ROS: Constitutional: no weight change, no fever ENT/Mouth: no sore throat, no rhinorrhea Eyes: no eye pain, no vision changes Cardiovascular: no chest pain, + dyspnea,  + edema, no palpitations Respiratory: no cough, no sputum, no wheezing Gastrointestinal: no nausea, no vomiting, no diarrhea, no constipation Genitourinary: no urinary incontinence, no dysuria, no hematuria Musculoskeletal: no arthralgias, no myalgias Skin: no skin lesions, no pruritus, Neuro: + weakness, no loss of consciousness, no syncope Psych: no anxiety, no depression, no decrease appetite Heme/Lymph: no bruising, no bleeding  ED Course: Discussed with EDP, patient requiring hospitalization for chief concerns of heart failure exacerbation.  Assessment/Plan  Principal Problem:   Acute exacerbation of CHF (congestive heart failure) (HCC) Active Problems:   Coronary artery disease involving coronary bypass graft of native heart without angina pectoris   Iron  deficiency anemia due to chronic blood loss   CAD (coronary artery disease)   Essential hypertension   Elevated troponin   History of stroke s/p left carotid endarterectomy   AKI (acute kidney injury)   GERD without esophagitis   Dyslipidemia   Depression  Abnormality of abdominal aorta  Assessment and Plan:  * Acute exacerbation of CHF (congestive heart failure) (HCC) Strict I's and O's Patient had a complete echo on 05/10/2024: With estimated ejection fraction of 60 to 65%, grade 1 diastolic dysfunction Query  noncompliance with medication versus p.o. 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 team to consider consultation to GI if indicated Protonix  40 mg IV twice daily initiated on admission Check CBC in the a.m.  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  Chart reviewed.   Echo on 05/10/2024: Estimate ejection fraction 60 to 65%, grade 1 diastolic dysfunction  DVT prophylaxis: Pharmacologic DVT not initiated on admission.  AM team to initiate pharmacologic DVT when the benefits outweigh the risk.  Code Status: Full code Diet: Heart  healthy Family Communication: No Disposition Plan: Pending clinical course, guarded prognosis Consults called: Vascular Admission status: Telemetry cardiac, inpatient  Past Medical History:  Diagnosis Date   COPD (chronic obstructive pulmonary disease) (HCC)    CVA (cerebral infarction)    Headache    mild, since stroke 2012   Hypertension    MI (myocardial infarction) (HCC) 05/09/2012   Reading difficulty    pt reports he reads at about a second grade level   Stroke [facility]) 2012   numbness to left hand    Wears dentures    full upper and lower   Past Surgical History:  Procedure Laterality Date   CAROTID ENDARTERECTOMY Left    2012 or 2013   CHOLECYSTECTOMY N/A 07/26/2016   Procedure: LAPAROSCOPIC CHOLECYSTECTOMY;  Surgeon: Charlie FORBES Fell, MD;  Location: [facility] ORS;  Service: General;  Laterality: N/A;   COLONOSCOPY N/A 05/08/2024   Procedure: COLONOSCOPY;  Surgeon: Jinny Carmine, MD;  Location: [facility] ENDOSCOPY;  Service: Endoscopy;  Laterality: N/A;   COLONOSCOPY WITH PROPOFOL  N/A 02/09/2022   Procedure: COLONOSCOPY WITH PROPOFOL ;  Surgeon: Jinny Carmine, MD;  Location: [facility] ENDOSCOPY;  Service: Endoscopy;  Laterality: N/A;   CORONARY ARTERY BYPASS GRAFT  05/10/2012   3 vessel   CORONARY STENT INTERVENTION N/A 07/02/2024   Procedure: CORONARY STENT INTERVENTION;  Surgeon: Katina Albright, MD;  Location: [facility] INVASIVE CV LAB;  Service: Cardiovascular;  Laterality: N/A;   CORONARY ULTRASOUND/IVUS N/A 07/02/2024   Procedure: Coronary Ultrasound/IVUS;  Surgeon: Katina,  Murray, MD;  Location: [facility] INVASIVE CV LAB;  Service: Cardiovascular;  Laterality: N/A;   ENTEROSCOPY N/A 05/16/2023   Procedure: ENTEROSCOPY;  Surgeon: Therisa Bi, MD;  Location: [facility] ENDOSCOPY;  Service: Gastroenterology;  Laterality: N/A;   ENTEROSCOPY N/A 06/01/2023   Procedure: ENTEROSCOPY;  Surgeon: Unk Corinn Skiff, MD;  Location: [facility] ENDOSCOPY;  Service: Gastroenterology;  Laterality: N/A;    ESOPHAGOGASTRODUODENOSCOPY  05/13/2023   Procedure: ESOPHAGOGASTRODUODENOSCOPY (EGD);  Surgeon: Jinny Carmine, MD;  Location: [facility] ENDOSCOPY;  Service: Endoscopy;;   ESOPHAGOGASTRODUODENOSCOPY (EGD) WITH PROPOFOL  N/A 10/28/2022   Procedure: ESOPHAGOGASTRODUODENOSCOPY (EGD) WITH PROPOFOL ;  Surgeon: Onita Elspeth Sharper, DO;  Location: [facility] ENDOSCOPY;  Service: Gastroenterology;  Laterality: N/A;   GIVENS CAPSULE STUDY  05/13/2023   Procedure: GIVENS CAPSULE STUDY;  Surgeon: Jinny Carmine, MD;  Location: [facility] ENDOSCOPY;  Service: Endoscopy;;   GIVENS CAPSULE STUDY  06/01/2023   Procedure: GIVENS CAPSULE STUDY;  Surgeon: Unk Corinn Skiff, MD;  Location: [facility] ENDOSCOPY;  Service: Gastroenterology;;   LEFT HEART CATH AND CORS/GRAFTS ANGIOGRAPHY N/A 07/02/2024   Procedure: LEFT HEART CATH AND CORS/GRAFTS ANGIOGRAPHY;  Surgeon: Katina Murray, MD;  Location: [facility] INVASIVE CV LAB;  Service: Cardiovascular;  Laterality: N/A;   Social History:  reports that he quit smoking about 32 years ago. His smoking use included cigarettes. His smokeless tobacco use includes snuff. He reports current alcohol  use of about 26.0 standard drinks of alcohol  per week. He reports that he does not use drugs.  Allergies  Allergen Reactions   Wellbutrin  [Bupropion ]     Paranoia    Family History  Problem Relation Age of Onset   Pneumonia Mother    Family history: Family history reviewed and not pertinent.  Prior to Admission medications   Medication Sig Start Date End Date Taking? Authorizing Provider  D3-50 1.25 MG (50000 UT) capsule Take 50,000 Units by mouth once a week. 06/06/24  Yes [provider]  folic acid  (KP FOLIC ACID ) 1 MG tablet Take 1 mg by mouth daily. 08/01/24  Yes [provider]  TRUE VITAMIN D3 10 MCG (400 UNIT) CAPS Take 2 capsules by mouth daily. 06/06/24  Yes [provider]  albuterol  (VENTOLIN  HFA) 108 (90 Base) MCG/ACT inhaler Inhale 2 puffs into the lungs every 6 (six)  hours as needed for wheezing or shortness of breath. 08/19/24   Josette Ade, MD  amiodarone  (PACERONE ) 200 MG tablet Take 1 tablet (200 mg total) by mouth daily. 08/22/24   Jerelene Critchley, MD  ascorbic acid  (VITAMIN C ) 500 MG tablet Take 1 tablet (500 mg total) by mouth daily. 08/19/24 11/17/24  Josette Ade, MD  aspirin  EC 81 MG tablet Take 1 tablet (81 mg total) by mouth daily. Swallow whole. 08/22/24 02/18/25  Jerelene Critchley, MD  clopidogrel  (PLAVIX ) 75 MG tablet Take 1 tablet (75 mg total) by mouth daily with breakfast. 08/19/24   Josette Ade, MD  dapagliflozin  propanediol (FARXIGA ) 10 MG TABS tablet Take 1 tablet (10 mg total) by mouth daily. 08/19/24   Josette Ade, MD  dextromethorphan -guaiFENesin  (MUCINEX  DM) 30-600 MG 12hr tablet Take 1 tablet by mouth 2 (two) times daily as needed for cough. 08/19/24   Josette Ade, MD  Fe Fum-Vit C-Vit B12-FA (TRIGELS-F FORTE) CAPS capsule Take 1 capsule by mouth 2 (two) times daily. 08/22/24   Jerelene Critchley, MD  Fluticasone -Umeclidin-Vilant (TRELEGY ELLIPTA ) 200-62.5-25 MCG/ACT AEPB Inhale 1 Inhalation into the lungs daily at 12 noon. 08/22/24   Jerelene Critchley, MD  gabapentin  (NEURONTIN ) 300 MG capsule Take 1 capsule (  300 mg total) by mouth 2 (two) times daily. 08/22/24   Jerelene Critchley, MD  isosorbide  mononitrate (IMDUR ) 30 MG 24 hr tablet Take 1 tablet (30 mg total) by mouth daily. 08/22/24   Jerelene Critchley, MD  lactulose  (CHRONULAC ) 10 GM/15ML solution Take 30 mLs (20 g total) by mouth daily as needed for moderate constipation or mild constipation. 08/19/24   Josette Ade, MD  losartan  (COZAAR ) 25 MG tablet Take 1 tablet (25 mg total) by mouth daily. 08/23/24   Jerelene Critchley, MD  melatonin 5 MG TABS Place 1 tablet (5 mg total) into feeding tube at bedtime as needed (sleep). 08/19/24   Josette Ade, MD  metoprolol  succinate (TOPROL -XL) 25 MG 24 hr tablet Take 1 tablet (25 mg total) by mouth daily. 08/22/24   Jerelene Critchley, MD  Multiple  Vitamin (MULTIVITAMIN WITH MINERALS) TABS tablet Take 1 tablet by mouth daily. 08/19/24   Josette Ade, MD  nicotine  (NICODERM CQ  - DOSED IN MG/24 HR) 7 mg/24hr patch One 7mg  patch chest wall daily 08/19/24   Josette Ade, MD  pantoprazole  (PROTONIX ) 40 MG tablet Take 1 tablet (40 mg total) by mouth daily. 08/22/24 10/21/24  Jerelene Critchley, MD  polyethylene glycol powder (GLYCOLAX /MIRALAX ) 17 GM/SCOOP powder Take 17 g by mouth at bedtime. Mix as directed. 08/22/24   Jerelene Critchley, MD  rosuvastatin  (CRESTOR ) 20 MG tablet Take 1 tablet (20 mg total) by mouth daily. 08/19/24   Josette Ade, MD  thiamine  (VITAMIN B-1) 100 MG tablet Take 1 tablet (100 mg total) by mouth daily. 08/19/24   Josette Ade, MD  torsemide  (DEMADEX ) 20 MG tablet Take 1 tablet (20 mg total) by mouth daily. 08/22/24   Jerelene Critchley, MD  Vitamin D , Ergocalciferol , (DRISDOL ) 1.25 MG (50000 UNIT) CAPS capsule Take 1 capsule (50,000 Units total) by mouth every 7 (seven) days. 08/19/24   Josette Ade, MD   Physical Exam: Vitals:   09/16/24 1330 09/16/24 1430 09/16/24 1440 09/16/24 1526  BP: 119/67 96/71 112/68 113/70  Pulse: 81  77 86  Resp: 20  20 (!) 24  Temp:   97.8 F (36.6 C) 97.8 F (36.6 C)  TempSrc:   Oral Oral  SpO2: 100%  100% 100%  Weight:      Height:       Constitutional: appears chronological age, frail, chronically ill, weak Eyes: PERRL, lids and conjunctivae normal ENMT: Mucous membranes are moist. Posterior pharynx clear of any exudate or lesions. Age-appropriate dentition. Hearing appropriate Neck: normal, supple, no masses, no thyromegaly Respiratory: clear to auscultation bilaterally, no wheezing, no crackles. Normal respiratory effort. No accessory muscle use.  Cardiovascular: Regular rate and rhythm, no murmurs / rubs / gallops. No extremity edema. 2+ pedal pulses. No carotid bruits.  Abdomen: Obese abdomen, no tenderness, no masses palpated, no hepatosplenomegaly. Bowel sounds positive.   Musculoskeletal: no clubbing / cyanosis. No joint deformity upper and lower extremities. Good ROM, no contractures, no atrophy. Normal muscle tone.  Skin: no rashes, lesions, ulcers. No induration Neurologic: Sensation intact. Strength 5/5 in all 4.  Psychiatric: Lacks general judgment and insight of medical condition. Alert and oriented x 3.  Depressed mood.  Flat affect  EKG: independently reviewed, showing sinus rhythm with rate of 81, QTc 555  Chest x-ray on Admission: I personally reviewed and I agree with radiologist reading as below.  CT ANGIO GI BLEED Result Date: 09/16/2024 CLINICAL DATA:  Lower abdominal pain, hemoccult positive stool EXAM: CTA ABDOMEN AND PELVIS WITHOUT AND WITH CONTRAST TECHNIQUE: Multidetector CT  imaging of the abdomen and pelvis was performed using the standard protocol during bolus administration of intravenous contrast. Multiplanar reconstructed images and MIPs were obtained and reviewed to evaluate the vascular anatomy. Noncontrast CT images of the abdomen and pelvis were also obtained. RADIATION DOSE REDUCTION: This exam was performed according to the departmental dose-optimization program which includes automated exposure control, adjustment of the mA and/or kV according to patient size and/or use of iterative reconstruction technique. CONTRAST:  OMNIPAQUE  IOHEXOL  350 MG/ML SOLN COMPARISON:  August 21, 2024 FINDINGS: VASCULAR Aorta: Tortuous abdominal aorta with ectasia of the infrarenal aorta, measuring up to 2.8 cm with associated focal dissection of the aorta. Celiac: Likely moderate stenosis of the proximal celiac artery. SMA: At least moderate stenosis of the proximal superior mesenteric artery. Renals: Moderate to severe stenosis of the bilateral renal arteries. IMA: Patent with no significant stenosis. Inflow: Atherosclerotic plaque in the bilateral iliac arteries with likely moderate multifocal stenoses in the bilateral common iliac arteries,  penetrating atheromatous plaque in the right common iliac artery, moderate to severe stenoses of the bilateral internal iliac arteries, and likely mild multifocal stenoses in the external iliac artery. Proximal Outflow: Likely mild stenoses in the bilateral common femoral arteries. Likely severe stenosis of the proximal right superficial femoral artery and at least moderate stenosis of the proximal left superficial femoral artery. Bilateral profundus femoral arteries are patent. Veins: Portal vein, splenic vein, and SMV are patent. No obvious acute venous abnormality on this arterial phase study. No evidence of arterial contrast extravasation to suggest active GI bleeding. Review of the MIP images confirms the above findings. NON-VASCULAR Lower chest: Small bilateral pleural effusions. Hepatobiliary: No suspicious liver lesions. Gallbladder is surgically absent. No biliary ductal dilation. Pancreas: Unremarkable. Spleen: Unremarkable. Adrenals/Urinary Tract: Adrenal glands are unremarkable. No nephrolithiasis or hydronephrosis. Stomach/Bowel: No evidence of bowel obstruction or inflammation. Appendix is not well visualized. Lymphatic: No lymphadenopathy by size criteria. Reproductive: Unremarkable. Other: Trace volume ascites. No free air. Small fat containing bilateral inguinal hernias. Musculoskeletal: Unchanged mild chronic T12 vertebral body compression deformity. IMPRESSION: VASCULAR 1. No evidence of active arterial GI bleeding. 2. Extensive atherosclerotic plaque as described above. 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 3. Likely moderate stenosis of the proximal celiac artery and superior mesenteric artery. 4. At least moderate stenoses in the bilateral renal arteries. 5. Likely moderate multifocal stenoses in the bilateral common iliac arteries and moderate to severe stenoses of the bilateral internal iliac arteries. 6. Severe stenosis of the visualized proximal right  superficial femoral artery. Moderate to severe stenosis of the visualized left proximal superficial femoral artery. NON-VASCULAR 1. Small bilateral pleural effusions and trace volume ascites. Electronically Signed   By: Michaeline Blanch M.D.   On: 09/16/2024 11:57   DG Chest 1 View Result Date: 09/16/2024 CLINICAL DATA:  Chronic shortness of breath with bilateral leg swelling and dizziness. EXAM: CHEST  1 VIEW COMPARISON:  Chest x-ray 08/16/2024, chest CT 08/21/2024 FINDINGS: Lungs are adequately inflated. There is mild prominence and indistinctness of the central pulmonary vessels suggesting a degree of vascular congestion. Minimal hazy density over the medial lung bases/retrocardiac regions which may be due to small amount of layering pleural fluid/atelectasis. Stable nodule opacity over the lateral right mid to upper lung as seen on recent chest CT. Stable cardiomegaly. Remainder the exam is unchanged. IMPRESSION: 1. Stable cardiomegaly with suggestion of mild vascular congestion. 2. Minimal hazy density over the medial lung bases/retrocardiac regions which may be due to  small amount of layering pleural fluid/atelectasis. 3. Stable nodule opacity over the lateral right mid to upper lung as seen on recent chest CT. Electronically Signed   By: Toribio Agreste M.D.   On: 09/16/2024 10:28   Labs on Admission: I have personally reviewed following labs  CBC: Recent Labs  Lab 09/16/24 0929  WBC 9.0  NEUTROABS 6.5  HGB 7.6*  HCT 26.1*  MCV 94.2  PLT 354   Basic Metabolic Panel: Recent Labs  Lab 09/16/24 0929  NA 136  K 3.5  CL 89*  CO2 37*  GLUCOSE 103*  BUN 17  CREATININE 1.41*  CALCIUM  8.4*   GFR: Estimated Creatinine Clearance: 65.4 mL/min (A) (by C-G formula based on SCr of 1.41 mg/dL (H)).  Liver Function Tests: Recent Labs  Lab 09/16/24 0929  AST 16  ALT 7  ALKPHOS 60  BILITOT 1.2  PROT 7.7  ALBUMIN  3.1*   Coagulation Profile: Recent Labs  Lab 09/16/24 0929  INR 1.2    Urine analysis:    Component Value Date/Time   COLORURINE YELLOW (A) 09/16/2024 1202   APPEARANCEUR CLEAR (A) 09/16/2024 1202   APPEARANCEUR Clear 09/09/2014 1708   LABSPEC 1.014 09/16/2024 1202   LABSPEC 1.011 09/09/2014 1708   PHURINE 7.0 09/16/2024 1202   GLUCOSEU 150 (A) 09/16/2024 1202   GLUCOSEU Negative 09/09/2014 1708   HGBUR NEGATIVE 09/16/2024 1202   BILIRUBINUR NEGATIVE 09/16/2024 1202   BILIRUBINUR Negative 09/09/2014 1708   KETONESUR NEGATIVE 09/16/2024 1202   PROTEINUR NEGATIVE 09/16/2024 1202   NITRITE NEGATIVE 09/16/2024 1202   LEUKOCYTESUR NEGATIVE 09/16/2024 1202   LEUKOCYTESUR Negative 09/09/2014 1708   This document was prepared using Dragon Voice Recognition software and may include unintentional dictation errors.  Dr. Sherre [facility]  If 7PM-7AM, please contact overnight-coverage provider If 7AM-7PM, please contact day attending provider [email or web address]  09/16/2024, 4:50 PM

## 2024-09-16 NOTE — Consult Note (Signed)
 Hospital Consult    Reason for Consult:  Aortic Dissection  Requesting Physician:  Dr Jerri Cheadle MD MRN #:  [id number]  History of Present Illness: This is a 63 y.o. male with history of COPD on 2 L at baseline, severe aortic stenosis, CAD, dementia, prior history of stroke, history of GI bleed, CHF, presenting with lower abdominal pain as well as intermittent black stools.  States that he had a bowel movement 2 days ago states that stools are green but for the last couple weeks has been intermittently black.  Denies any hematochezia.  No nausea or vomiting, no urinary symptoms.  He denies any chest pain.  Does state that he is lightheaded especially when he stands up and ambulates.  Also has shortness of breath but states has been ongoing for a while.  Patient also notes that his bilateral lower extremities have been more swollen since his discharge from the hospital earlier this month.  No syncopal episodes. Vascular Surgery was consulted to evaluate possible aortic dissection.     Past Medical History:  Diagnosis Date   COPD (chronic obstructive pulmonary disease) (HCC)    CVA (cerebral infarction)    Headache    mild, since stroke 2012   Hypertension    MI (myocardial infarction) (HCC) 05/09/2012   Reading difficulty    pt reports he reads at about a second grade level   Stroke [facility]) 2012   numbness to left hand    Wears dentures    full upper and lower    Past Surgical History:  Procedure Laterality Date   CAROTID ENDARTERECTOMY Left    2012 or 2013   CHOLECYSTECTOMY N/A 07/26/2016   Procedure: LAPAROSCOPIC CHOLECYSTECTOMY;  Surgeon: Charlie FORBES Fell, MD;  Location: [facility] ORS;  Service: General;  Laterality: N/A;   COLONOSCOPY N/A 05/08/2024   Procedure: COLONOSCOPY;  Surgeon: Jinny Carmine, MD;  Location: [facility] ENDOSCOPY;  Service: Endoscopy;  Laterality: N/A;   COLONOSCOPY WITH PROPOFOL  N/A 02/09/2022   Procedure: COLONOSCOPY WITH PROPOFOL ;  Surgeon: Jinny Carmine, MD;  Location:  [facility] ENDOSCOPY;  Service: Endoscopy;  Laterality: N/A;   CORONARY ARTERY BYPASS GRAFT  05/10/2012   3 vessel   CORONARY STENT INTERVENTION N/A 07/02/2024   Procedure: CORONARY STENT INTERVENTION;  Surgeon: Katina Albright, MD;  Location: [facility] INVASIVE CV LAB;  Service: Cardiovascular;  Laterality: N/A;   CORONARY ULTRASOUND/IVUS N/A 07/02/2024   Procedure: Coronary Ultrasound/IVUS;  Surgeon: Katina Albright, MD;  Location: [facility] INVASIVE CV LAB;  Service: Cardiovascular;  Laterality: N/A;   ENTEROSCOPY N/A 05/16/2023   Procedure: ENTEROSCOPY;  Surgeon: Therisa Bi, MD;  Location: [facility] ENDOSCOPY;  Service: Gastroenterology;  Laterality: N/A;   ENTEROSCOPY N/A 06/01/2023   Procedure: ENTEROSCOPY;  Surgeon: Unk Corinn Skiff, MD;  Location: [facility] ENDOSCOPY;  Service: Gastroenterology;  Laterality: N/A;   ESOPHAGOGASTRODUODENOSCOPY  05/13/2023   Procedure: ESOPHAGOGASTRODUODENOSCOPY (EGD);  Surgeon: Jinny Carmine, MD;  Location: [facility] ENDOSCOPY;  Service: Endoscopy;;   ESOPHAGOGASTRODUODENOSCOPY (EGD) WITH PROPOFOL  N/A 10/28/2022   Procedure: ESOPHAGOGASTRODUODENOSCOPY (EGD) WITH PROPOFOL ;  Surgeon: Onita Elspeth Sharper, DO;  Location: [facility] ENDOSCOPY;  Service: Gastroenterology;  Laterality: N/A;   GIVENS CAPSULE STUDY  05/13/2023   Procedure: GIVENS CAPSULE STUDY;  Surgeon: Jinny Carmine, MD;  Location: [facility] ENDOSCOPY;  Service: Endoscopy;;   GIVENS CAPSULE STUDY  06/01/2023   Procedure: GIVENS CAPSULE STUDY;  Surgeon: Unk Corinn Skiff, MD;  Location: [facility] ENDOSCOPY;  Service: Gastroenterology;;   LEFT HEART CATH AND CORS/GRAFTS ANGIOGRAPHY N/A 07/02/2024   Procedure: LEFT HEART CATH  AND CORS/GRAFTS ANGIOGRAPHY;  Surgeon: Katina Albright, MD;  Location: [facility] INVASIVE CV LAB;  Service: Cardiovascular;  Laterality: N/A;    Allergies  Allergen Reactions   Wellbutrin  [Bupropion ]     Paranoia     Prior to Admission medications   Medication Sig Start Date End Date Taking? Authorizing Provider  albuterol  (VENTOLIN   HFA) 108 (90 Base) MCG/ACT inhaler Inhale 2 puffs into the lungs every 6 (six) hours as needed for wheezing or shortness of breath. 08/19/24  Yes Wieting, Richard, MD  amiodarone  (PACERONE ) 200 MG tablet Take 1 tablet (200 mg total) by mouth daily. 08/22/24  Yes Ponnala, Shruthi, MD  ascorbic acid  (VITAMIN C ) 500 MG tablet Take 1 tablet (500 mg total) by mouth daily. 08/19/24 11/17/24 Yes Josette Ade, MD  aspirin  EC 81 MG tablet Take 1 tablet (81 mg total) by mouth daily. Swallow whole. 08/22/24 02/18/25 Yes Ponnala, Shruthi, MD  clopidogrel  (PLAVIX ) 75 MG tablet Take 1 tablet (75 mg total) by mouth daily with breakfast. 08/19/24  Yes Wieting, Richard, MD  D3-50 1.25 MG (50000 UT) capsule Take 50,000 Units by mouth once a week. 06/06/24  Yes [provider]  dapagliflozin  propanediol (FARXIGA ) 10 MG TABS tablet Take 1 tablet (10 mg total) by mouth daily. 08/19/24  Yes Wieting, Richard, MD  dextromethorphan -guaiFENesin  (MUCINEX  DM) 30-600 MG 12hr tablet Take 1 tablet by mouth 2 (two) times daily as needed for cough. 08/19/24  Yes Wieting, Richard, MD  Fe Fum-Vit C-Vit B12-FA (TRIGELS-F FORTE) CAPS capsule Take 1 capsule by mouth 2 (two) times daily. 08/22/24  Yes Jerelene Critchley, MD  Fluticasone -Umeclidin-Vilant (TRELEGY ELLIPTA ) 200-62.5-25 MCG/ACT AEPB Inhale 1 Inhalation into the lungs daily at 12 noon. 08/22/24  Yes Ponnala, Shruthi, MD  folic acid  (KP FOLIC ACID ) 1 MG tablet Take 1 mg by mouth daily. 08/01/24  Yes [provider]  gabapentin  (NEURONTIN ) 300 MG capsule Take 1 capsule (300 mg total) by mouth 2 (two) times daily. 08/22/24  Yes Ponnala, Shruthi, MD  isosorbide  mononitrate (IMDUR ) 30 MG 24 hr tablet Take 1 tablet (30 mg total) by mouth daily. 08/22/24  Yes Ponnala, Shruthi, MD  lactulose  (CHRONULAC ) 10 GM/15ML solution Take 30 mLs (20 g total) by mouth daily as needed for moderate constipation or mild constipation. 08/19/24  Yes Wieting, Richard, MD  losartan  (COZAAR ) 25 MG tablet Take 1  tablet (25 mg total) by mouth daily. 08/23/24  Yes Ponnala, Shruthi, MD  melatonin 5 MG TABS Place 1 tablet (5 mg total) into feeding tube at bedtime as needed (sleep). 08/19/24  Yes Wieting, Richard, MD  metoprolol  succinate (TOPROL -XL) 25 MG 24 hr tablet Take 1 tablet (25 mg total) by mouth daily. 08/22/24  Yes Ponnala, Shruthi, MD  Multiple Vitamin (MULTIVITAMIN WITH MINERALS) TABS tablet Take 1 tablet by mouth daily. 08/19/24  Yes Wieting, Richard, MD  pantoprazole  (PROTONIX ) 40 MG tablet Take 1 tablet (40 mg total) by mouth daily. 08/22/24 10/21/24 Yes Ponnala, Shruthi, MD  polyethylene glycol powder (GLYCOLAX /MIRALAX ) 17 GM/SCOOP powder Take 17 g by mouth at bedtime. Mix as directed. 08/22/24  Yes Ponnala, Shruthi, MD  rosuvastatin  (CRESTOR ) 20 MG tablet Take 1 tablet (20 mg total) by mouth daily. 08/19/24  Yes Wieting, Richard, MD  thiamine  (VITAMIN B-1) 100 MG tablet Take 1 tablet (100 mg total) by mouth daily. 08/19/24  Yes Wieting, Richard, MD  torsemide  (DEMADEX ) 20 MG tablet Take 1 tablet (20 mg total) by mouth daily. 08/22/24  Yes Jerelene Critchley, MD  TRUE VITAMIN D3 10  MCG (400 UNIT) CAPS Take 2 capsules by mouth daily. 06/06/24  Yes [provider]  Vitamin D , Ergocalciferol , (DRISDOL ) 1.25 MG (50000 UNIT) CAPS capsule Take 1 capsule (50,000 Units total) by mouth every 7 (seven) days. 08/19/24  Yes Wieting, Richard, MD  nicotine  (NICODERM CQ  - DOSED IN MG/24 HR) 7 mg/24hr patch One 7mg  patch chest wall daily 08/19/24   Josette Ade, MD    Social History   Socioeconomic History   Marital status: Divorced    Spouse name: Not on file   Number of children: Not on file   Years of education: Not on file   Highest education level: Not on file  Occupational History   Not on file  Tobacco Use   Smoking status: Former    Current packs/day: 0.00    Types: Cigarettes    Quit date: 24    Years since quitting: 32.7   Smokeless tobacco: Current    Types: Snuff   Tobacco comments:    changed  to dip  Vaping Use   Vaping status: Never Used  Substance and Sexual Activity   Alcohol  use: Yes    Alcohol /week: 26.0 standard drinks of alcohol     Types: 26 Cans of beer per week   Drug use: No   Sexual activity: Yes    Birth control/protection: None  Other Topics Concern   Not on file  Social History Narrative   Not on file   Social Drivers of Health   Financial Resource Strain: Not on file  Food Insecurity: No Food Insecurity (08/13/2024)   Hunger Vital Sign    Worried About Running Out of Food in the Last Year: Never true    Ran Out of Food in the Last Year: Never true  Transportation Needs: No Transportation Needs (08/13/2024)   PRAPARE - Administrator, Civil Service (Medical): No    Lack of Transportation (Non-Medical): No  Physical Activity: Not on file  Stress: Not on file  Social Connections: Socially Isolated (08/13/2024)   Social Connection and Isolation Panel    Frequency of Communication with Friends and Family: More than three times a week    Frequency of Social Gatherings with Friends and Family: More than three times a week    Attends Religious Services: Never    Database administrator or Organizations: No    Attends Banker Meetings: Never    Marital Status: Divorced  Catering manager Violence: Not At Risk (08/13/2024)   Humiliation, Afraid, Rape, and Kick questionnaire    Fear of Current or Ex-Partner: No    Emotionally Abused: No    Physically Abused: No    Sexually Abused: No     Family History  Problem Relation Age of Onset   Pneumonia Mother     ROS: Otherwise negative unless mentioned in HPI  Physical Examination  Vitals:   09/16/24 1440 09/16/24 1526  BP: 112/68 113/70  Pulse: 77 86  Resp: 20 (!) 24  Temp: 97.8 F (36.6 C) 97.8 F (36.6 C)  SpO2: 100% 100%   Body mass index is 31.33 kg/m.  General:  WDWN in NAD Gait: Not observed HENT: WNL, normocephalic Pulmonary: normal non-labored breathing, without  Rales, rhonchi,  wheezing Cardiac: regular, without  Murmurs, rubs or gallops; without carotid bruits Abdomen: Positive bowel sounds throughout, soft, NT/ND, no masses Skin: without rashes Vascular Exam/Pulses: Positive weak doppler pulses to bilateral lower extremities.  Extremities: without ischemic changes, without Gangrene , without cellulitis;  without open wounds;  Musculoskeletal: no muscle wasting or atrophy  Neurologic: A&O X 3;  No focal weakness or paresthesias are detected; speech is fluent/normal Psychiatric:  The pt has Normal affect. Lymph:  Unremarkable  CBC    Component Value Date/Time   WBC 9.0 09/16/2024 0929   RBC 2.77 (L) 09/16/2024 0929   HGB 7.6 (L) 09/16/2024 0929   HGB 16.2 12/02/2014 1332   HCT 26.1 (L) 09/16/2024 0929   HCT 49.0 12/02/2014 1332   PLT 354 09/16/2024 0929   PLT 196 12/02/2014 1332   MCV 94.2 09/16/2024 0929   MCV 100 12/02/2014 1332   MCH 27.4 09/16/2024 0929   MCHC 29.1 (L) 09/16/2024 0929   RDW 16.4 (H) 09/16/2024 0929   RDW 13.9 12/02/2014 1332   LYMPHSABS 0.9 09/16/2024 0929   LYMPHSABS 1.4 06/10/2013 1528   MONOABS 1.3 (H) 09/16/2024 0929   MONOABS 1.4 (H) 06/10/2013 1528   EOSABS 0.2 09/16/2024 0929   EOSABS 0.1 06/10/2013 1528   BASOSABS 0.1 09/16/2024 0929   BASOSABS 0.1 06/10/2013 1528    BMET    Component Value Date/Time   NA 136 09/16/2024 0929   NA 134 (L) 12/02/2014 1332   K 3.5 09/16/2024 0929   K 3.7 12/02/2014 1332   CL 89 (L) 09/16/2024 0929   CL 98 12/02/2014 1332   CO2 37 (H) 09/16/2024 0929   CO2 29 12/02/2014 1332   GLUCOSE 103 (H) 09/16/2024 0929   GLUCOSE 79 12/02/2014 1332   BUN 17 09/16/2024 0929   BUN 6 (L) 12/02/2014 1332   CREATININE 1.41 (H) 09/16/2024 0929   CREATININE 0.68 12/02/2014 1332   CALCIUM  8.4 (L) 09/16/2024 0929   CALCIUM  8.4 (L) 12/02/2014 1332   GFRNONAA 55 (L) 09/16/2024 0929   GFRNONAA >60 12/02/2014 1332   GFRNONAA >60 12/27/2013 1433   GFRAA 41 (L) 06/01/2020 0458    GFRAA >60 12/02/2014 1332   GFRAA >60 12/27/2013 1433    COAGS: Lab Results  Component Value Date   INR 1.2 09/16/2024   INR 1.2 08/12/2024   INR 1.1 07/11/2024     Non-Invasive Vascular Imaging:   EXAM:09/16/2024 CTA ABDOMEN AND PELVIS WITHOUT AND WITH CONTRAST   TECHNIQUE: Multidetector CT imaging of the abdomen and pelvis was performed using the standard protocol during bolus administration of intravenous contrast. Multiplanar reconstructed images and MIPs were obtained and reviewed to evaluate the vascular anatomy. Noncontrast CT images of the abdomen and pelvis were also obtained.   RADIATION DOSE REDUCTION: This exam was performed according to the departmental dose-optimization program which includes automated exposure control, adjustment of the mA and/or kV according to patient size and/or use of iterative reconstruction technique.   CONTRAST:  OMNIPAQUE  IOHEXOL  350 MG/ML SOLN   COMPARISON:  August 21, 2024   FINDINGS: VASCULAR   Aorta: Tortuous abdominal aorta with ectasia of the infrarenal aorta, measuring up to 2.8 cm with associated focal dissection of the aorta.   Celiac: Likely moderate stenosis of the proximal celiac artery.   SMA: At least moderate stenosis of the proximal superior mesenteric artery.   Renals: Moderate to severe stenosis of the bilateral renal arteries.   IMA: Patent with no significant stenosis.   Inflow: Atherosclerotic plaque in the bilateral iliac arteries with likely moderate multifocal stenoses in the bilateral common iliac arteries, penetrating atheromatous plaque in the right common iliac artery, moderate to severe stenoses of the bilateral internal iliac arteries, and likely mild multifocal stenoses in the external  iliac artery.   Proximal Outflow: Likely mild stenoses in the bilateral common femoral arteries. Likely severe stenosis of the proximal right superficial femoral artery and at least moderate stenosis  of the proximal left superficial femoral artery. Bilateral profundus femoral arteries are patent.   Veins: Portal vein, splenic vein, and SMV are patent. No obvious acute venous abnormality on this arterial phase study.   No evidence of arterial contrast extravasation to suggest active GI bleeding.   Review of the MIP images confirms the above findings.   NON-VASCULAR   Lower chest: Small bilateral pleural effusions.   Hepatobiliary: No suspicious liver lesions. Gallbladder is surgically absent. No biliary ductal dilation.   Pancreas: Unremarkable.   Spleen: Unremarkable.   Adrenals/Urinary Tract: Adrenal glands are unremarkable. No nephrolithiasis or hydronephrosis.   Stomach/Bowel: No evidence of bowel obstruction or inflammation. Appendix is not well visualized.   Lymphatic: No lymphadenopathy by size criteria.   Reproductive: Unremarkable.   Other: Trace volume ascites. No free air. Small fat containing bilateral inguinal hernias.   Musculoskeletal: Unchanged mild chronic T12 vertebral body compression deformity.   IMPRESSION: VASCULAR   1. No evidence of active arterial GI bleeding. 2. Extensive atherosclerotic plaque as described above. 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 3. Likely moderate stenosis of the proximal celiac artery and superior mesenteric artery. 4. At least moderate stenoses in the bilateral renal arteries. 5. Likely moderate multifocal stenoses in the bilateral common iliac arteries and moderate to severe stenoses of the bilateral internal iliac arteries. 6. Severe stenosis of the visualized proximal right superficial femoral artery. Moderate to severe stenosis of the visualized left proximal superficial femoral artery.   NON-VASCULAR   1. Small bilateral pleural effusions and trace volume ascites.  Statin:  Yes.   Beta Blocker:  Yes.   Aspirin :  Yes.   ACEI:  No. ARB:  Yes.   CCB use:  No Other  antiplatelets/anticoagulants:  Yes.   Plavix  75 mg Daily   ASSESSMENT/PLAN: This is a 63 y.o. male with history of heart failure preserved ejection fraction, morbid obesity, hypertension, neuropathy, insomnia, hyperlipidemia, coronary artery disease, GERD, chronic oxygen requirement on 2 L nasal cannula, who presents ED for chief concerns of bilateral lower extremity swelling, dizziness, abdominal pain.   After reviewing the CTA the patient does not appear to have a dissection. He does however have extensive atherosclerotic plaque in multiple areas including his aorta. Therefore I have ordered bilateral lower extremity arterial duplex ultrasounds with ABI's while the patient is here to better determine blood flow to his legs. Vascular Surgery does not recommend any intervention/surgery at this time. We will follow up with the patient as outpatient regarding his multiple areas of atherosclerosis. The patient may require procedures in the future needing anticoagulation for the long term and therefore it would be best if he was worked up to rule out GI bleeding or chronic blood loss causing him anemia prior to committing him to these medications     -I discussed the case with Dr Selinda Gu MD and He agrees with the plan.    Gwendlyn JONELLE Shank [facility] 09/16/2024 4:47 PM

## 2024-09-16 NOTE — Assessment & Plan Note (Signed)
 With ventricular ectopy Per cardiology note on 08/19/2024, continue amiodarone  200 mg daily

## 2024-09-16 NOTE — Assessment & Plan Note (Signed)
 Strict I's and O's Patient had a complete echo on 05/10/2024: With estimated ejection fraction of 60 to 65%, grade 1 diastolic dysfunction Query noncompliance with medication versus p.o. fluid intake Continue with furosemide  40 mg IV twice daily, 2 doses ordered on admission with first dose given today in the evening

## 2024-09-16 NOTE — ED Triage Notes (Signed)
 Pt arrives via [facility] from home for abdominal pain, BLE swelling, dizziness, and SOB. Pt c/o dizziness with standing. Per EMS, BP was soft. Pt recently discharged from here post 3 week admission. Pt has multiple chronic health problems.

## 2024-09-16 NOTE — Assessment & Plan Note (Addendum)
 Vs increased in sCr Suspect cardiorenal, treatment per above Strict I/Os Recheck BMP in a.m.

## 2024-09-16 NOTE — ED Notes (Signed)
 Called [facility] for central monitoring at this time

## 2024-09-16 NOTE — Assessment & Plan Note (Signed)
 Home rosuvastatin 20 mg nightly resumed

## 2024-09-16 NOTE — ED Notes (Signed)
 Pt urinated at this time. Urine sample collected.

## 2024-09-16 NOTE — ED Provider Notes (Signed)
 [facility] Provider Note    Event Date/Time   First MD Initiated Contact with Patient 09/16/24 [phone]     (approximate)   History   Abdominal Pain, Shortness of Breath, and Leg Swelling   HPI  [name] is a 63 y.o. male with history of COPD on 2 L at baseline, severe aortic stenosis, CAD, dementia, prior history of stroke, history of GI bleed, CHF, presenting with lower abdominal pain as well as intermittent black stools.  States that he had a bowel movement 2 days ago states that stools are green but for the last couple weeks has been intermittently black.  Denies any hematochezia.  No nausea or vomiting, no urinary symptoms.  He denies any chest pain.  Does state that he is lightheaded especially when he stands up and ambulates.  Also has shortness of breath but states has been ongoing for a while.  Patient also notes that his bilateral lower extremities have been more swollen since his discharge from the hospital earlier this month.  No syncopal episodes.  Independently obtained from EMS, their initial blood pressure was systolic 100s, states that when he got up to stand was systolic in the 80s.   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  He also received IV iron .  His transfusion threshold is less than 8.     Physical Exam   Triage Vital Signs: ED Triage Vitals  Encounter Vitals Group     BP 09/16/24 0926 110/65     Girls Systolic BP Percentile --      Girls Diastolic BP Percentile --      Boys Systolic BP Percentile --      Boys Diastolic BP Percentile --      Pulse Rate 09/16/24 0921 82     Resp 09/16/24 0921 (!) 22     Temp 09/16/24 0924 98.5 F (36.9 C)     Temp Source 09/16/24 0924 Oral     SpO2 09/16/24 0921 97 %     Weight 09/16/24 0926 231 lb  (104.8 kg)     Height 09/16/24 0926 6' (1.829 m)     Head Circumference --      Peak Flow --      Pain Score 09/16/24 0921 10     Pain Loc --      Pain Education --      Exclude from Growth Chart --     Most recent vital signs: Vitals:   09/16/24 1206 09/16/24 1230  BP: 125/72 115/75  Pulse: 81 78  Resp: 17 20  Temp: 97.8 F (36.6 C)   SpO2: 100% 100%     General: Awake, no distress.  CV:  Good peripheral perfusion.  Resp:  Normal effort.  No wheezing, no respiratory distress Abd:  No distention.  Soft, mildly tender to the bilateral lower quadrants Other:  Bilateral lower extremity edema that appears symmetrical, rectal exam was done with chaperone, brown stool in rectal vault that is Hemoccult positive.   ED Results / Procedures / Treatments   Labs (all labs ordered are listed, but only abnormal results are displayed) Labs Reviewed  COMPREHENSIVE METABOLIC PANEL WITH GFR - Abnormal; Notable for the following components:      Result Value   Chloride 89 (*)  CO2 37 (*)    Glucose, Bld 103 (*)    Creatinine, Ser 1.41 (*)    Calcium  8.4 (*)    Albumin  3.1 (*)    GFR, Estimated 55 (*)    All other components within normal limits  CBC WITH DIFFERENTIAL/PLATELET - Abnormal; Notable for the following components:   RBC 2.77 (*)    Hemoglobin 7.6 (*)    HCT 26.1 (*)    MCHC 29.1 (*)    RDW 16.4 (*)    Monocytes Absolute 1.3 (*)    All other components within normal limits  PROTIME-INR - Abnormal; Notable for the following components:   Prothrombin Time 16.1 (*)    All other components within normal limits  URINALYSIS, W/ REFLEX TO CULTURE (INFECTION SUSPECTED) - Abnormal; Notable for the following components:   Color, Urine YELLOW (*)    APPearance CLEAR (*)    Glucose, UA 150 (*)    Bacteria, UA RARE (*)    All other components within normal limits  BRAIN NATRIURETIC PEPTIDE - Abnormal; Notable for the following components:   B Natriuretic Peptide 1,583.6 (*)     All other components within normal limits  TROPONIN I (HIGH SENSITIVITY) - Abnormal; Notable for the following components:   Troponin I (High Sensitivity) 30 (*)    All other components within normal limits  TROPONIN I (HIGH SENSITIVITY) - Abnormal; Notable for the following components:   Troponin I (High Sensitivity) 29 (*)    All other components within normal limits  TYPE AND SCREEN  PREPARE RBC (CROSSMATCH)     EKG  EKG shows, sinus rhythm, rate 81, normal QRS, prolonged QTc, baseline is wandering due to patient movement, no obvious ischemic ST elevation, T wave flattening to inferior lateral leads, T wave changes new compared to prior   RADIOLOGY On my independent interpretation, chest x-ray shows vascular congestion   PROCEDURES:  Critical Care performed: Yes, see critical care procedure note(s)  .Critical Care  Performed by: Waymond Lorelle Cummins, MD Authorized by: Waymond Lorelle Cummins, MD   Critical care provider statement:    Critical care time (minutes):  40   Critical care was necessary to treat or prevent imminent or life-threatening deterioration of the following conditions:  Circulatory failure (Hematologic)   Critical care was time spent personally by me on the following activities:  Development of treatment plan with patient or surrogate, discussions with consultants, evaluation of patient's response to treatment, examination of patient, ordering and review of laboratory studies, ordering and review of radiographic studies, ordering and performing treatments and interventions, pulse oximetry, re-evaluation of patient's condition and review of old charts    MEDICATIONS ORDERED IN ED: Medications  0.9 %  sodium chloride  infusion (Manually program via Guardrails IV Fluids) (0 mLs Intravenous Hold 09/16/24 1152)  furosemide  (LASIX ) injection 40 mg (40 mg Intravenous Given 09/16/24 1131)  pantoprazole  (PROTONIX ) injection 80 mg (80 mg Intravenous Given 09/16/24 1141)  iohexol   (OMNIPAQUE ) 350 MG/ML injection 100 mL (100 mLs Intravenous Contrast Given 09/16/24 1105)     IMPRESSION / MDM / ASSESSMENT AND PLAN / ED COURSE  I reviewed the triage vital signs and the nursing notes.                              Differential diagnosis includes, but is not limited to, GI bleed, likely from AVMs, symptomatic severe aortic stenosis, CHF exacerbation, volume overload, colitis, diverticulitis, UTI, atypical ACS,  arrhythmia, consider COPD but patient has no wheezing on exam.  Will get labs, EKG, troponin, chest x-ray, CT angio GI bleed protocol, he will likely need to be admitted for further management.  Patient's presentation is most consistent with acute presentation with potential threat to life or bodily function.  Independent interpretation of labs and imaging below.  Clinical course as below.  Consulted hospitalist who is agreeable with the plan for admission and will evaluate the patient.  He is admitted.  The patient is on the cardiac monitor to evaluate for evidence of arrhythmia and/or significant heart rate changes.   Clinical Course as of 09/16/24 1315  Tue Sep 16, 2024  1031 Independent review of labs, mild AKI noted, his troponin is mildly elevated, BNP is elevated, no leukocytosis, his hemoglobin is downtrending compared to 3 weeks ago.  Suspect slow GI bleed. [TT]  1043 DG Chest 1 View IMPRESSION: 1. Stable cardiomegaly with suggestion of mild vascular congestion. 2. Minimal hazy density over the medial lung bases/retrocardiac regions which may be due to small amount of layering pleural fluid/atelectasis. 3. Stable nodule opacity over the lateral right mid to upper lung as seen on recent chest CT.   [TT]  1045 Patient's hemoglobin is less than 8, will plan to transfuse him 1 unit, given volume overloaded noted with increasing lower extremity edema, vascular congestion on chest x-ray and elevated BNP, will give him some IV Lasix . [TT]  1225 CT ANGIO GI  BLEED IMPRESSION: VASCULAR  1. No evidence of active arterial GI bleeding. 2. Extensive atherosclerotic plaque as described above. 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 3. Likely moderate stenosis of the proximal celiac artery and superior mesenteric artery. 4. At least moderate stenoses in the bilateral renal arteries. 5. Likely moderate multifocal stenoses in the bilateral common iliac arteries and moderate to severe stenoses of the bilateral internal iliac arteries. 6. Severe stenosis of the visualized proximal right superficial femoral artery. Moderate to severe stenosis of the visualized left proximal superficial femoral artery.  NON-VASCULAR  1. Small bilateral pleural effusions and trace volume ascites.   [TT]  1242 Urinalysis, w/ Reflex to Culture (Infection Suspected) -Urine, Clean Catch(!) Not consistent with UTI. [TT]  1255 Spoke to vascular surgery about the dissection, he will come down to see the patient says that he will eventually need antiplatelets but no additional surgical intervention at this time. [TT]    Clinical Course User Index [TT] Waymond Lorelle Cummins, MD     FINAL CLINICAL IMPRESSION(S) / ED DIAGNOSES   Final diagnoses:  Lower abdominal pain  Acute on chronic congestive heart failure, unspecified heart failure type (HCC)  Anemia, unspecified type  Gastrointestinal hemorrhage, unspecified gastrointestinal hemorrhage type  Dissection of infrarenal aorta (HCC)     Rx / DC Orders   ED Discharge Orders     None        Note:  This document was prepared using Dragon voice recognition software and may include unintentional dictation errors.    Waymond Lorelle Cummins, MD 09/16/24 [phone]

## 2024-09-16 NOTE — Hospital Course (Addendum)
 Mr. Emmanual Gauthreaux is a 63 year old male with history of heart failure preserved ejection fraction, morbid obesity, hypertension, neuropathy, insomnia, hyperlipidemia, coronary artery disease, GERD, chronic oxygen requirement on 2 L nasal cannula, who presents ED for chief concerns of bilateral lower extremity swelling, dizziness, abdominal pain.  Vitals in the ED showed T of 98.5, rr 22, hr 79, blood pressure 104/62, SpO2 99% on 2 L nasal cannula.  Serum sodium is 136, potassium 3.5, chloride 89, bicarb 37, BUN of 17, serum creatinine 1.41, eGFR 55, nonfasting glucose 103, WBC 9.0, hemoglobin 7.6, platelets of 354.  BNP elevated 1583.6.  HS troponin was 30 and on repeat is 29.  UA was negative for leukocytes and nitrates.  ED treatment: Patient is typed and screened, 1 unit of blood ordered for transfusion.  Furosemide  40 mg IV one-time dose, Protonix  80 mg IV one-time dose.

## 2024-09-17 ENCOUNTER — Other Ambulatory Visit: Payer: Self-pay

## 2024-09-17 ENCOUNTER — Inpatient Hospital Stay

## 2024-09-17 DIAGNOSIS — Z7902 Long term (current) use of antithrombotics/antiplatelets: Secondary | ICD-10-CM

## 2024-09-17 DIAGNOSIS — I5033 Acute on chronic diastolic (congestive) heart failure: Secondary | ICD-10-CM | POA: Diagnosis not present

## 2024-09-17 DIAGNOSIS — I7102 Dissection of abdominal aorta: Secondary | ICD-10-CM

## 2024-09-17 DIAGNOSIS — Z9981 Dependence on supplemental oxygen: Secondary | ICD-10-CM

## 2024-09-17 DIAGNOSIS — Z87891 Personal history of nicotine dependence: Secondary | ICD-10-CM

## 2024-09-17 DIAGNOSIS — Z79899 Other long term (current) drug therapy: Secondary | ICD-10-CM

## 2024-09-17 DIAGNOSIS — Z7982 Long term (current) use of aspirin: Secondary | ICD-10-CM

## 2024-09-17 DIAGNOSIS — I700 Atherosclerosis of aorta: Secondary | ICD-10-CM

## 2024-09-17 LAB — BPAM RBC
Blood Product Expiration Date: 202510052359
ISSUE DATE / TIME: 202509301130
Unit Type and Rh: 600

## 2024-09-17 LAB — BASIC METABOLIC PANEL WITH GFR
Anion gap: 13 (ref 5–15)
BUN: 20 mg/dL (ref 8–23)
CO2: 35 mmol/L — ABNORMAL HIGH (ref 22–32)
Calcium: 8 mg/dL — ABNORMAL LOW (ref 8.9–10.3)
Chloride: 89 mmol/L — ABNORMAL LOW (ref 98–111)
Creatinine, Ser: 1.43 mg/dL — ABNORMAL HIGH (ref 0.61–1.24)
GFR, Estimated: 54 mL/min — ABNORMAL LOW (ref >60)
Glucose, Bld: 122 mg/dL — ABNORMAL HIGH (ref 70–99)
Potassium: 3.3 mmol/L — ABNORMAL LOW (ref 3.5–5.1)
Sodium: 137 mmol/L (ref 135–145)

## 2024-09-17 LAB — TYPE AND SCREEN
ABO/RH(D): A POS
Antibody Screen: NEGATIVE
Unit division: 0

## 2024-09-17 LAB — CBC
HCT: 27.1 % — ABNORMAL LOW (ref 39.0–52.0)
Hemoglobin: 8.1 g/dL — ABNORMAL LOW (ref 13.0–17.0)
MCH: 27.5 pg (ref 26.0–34.0)
MCHC: 29.9 g/dL — ABNORMAL LOW (ref 30.0–36.0)
MCV: 91.9 fL (ref 80.0–100.0)
Platelets: 365 K/uL (ref 150–400)
RBC: 2.95 MIL/uL — ABNORMAL LOW (ref 4.22–5.81)
RDW: 16.9 % — ABNORMAL HIGH (ref 11.5–15.5)
WBC: 12.1 K/uL — ABNORMAL HIGH (ref 4.0–10.5)
nRBC: 0 % (ref 0.0–0.2)

## 2024-09-17 MED ORDER — PREGABALIN 50 MG PO CAPS
50.0000 mg | ORAL_CAPSULE | Freq: Three times a day (TID) | ORAL | Status: DC
  Administered 2024-09-17 – 2024-09-24 (×21): 50 mg via ORAL
  Filled 2024-09-17 (×21): qty 1

## 2024-09-17 MED ORDER — ENSURE PLUS HIGH PROTEIN PO LIQD
237.0000 mL | Freq: Two times a day (BID) | ORAL | Status: DC
  Administered 2024-09-17 – 2024-09-24 (×13): 237 mL via ORAL

## 2024-09-17 MED ORDER — FUROSEMIDE 10 MG/ML IJ SOLN
40.0000 mg | Freq: Every day | INTRAMUSCULAR | Status: DC
  Filled 2024-09-17: qty 4

## 2024-09-17 MED ORDER — FUROSEMIDE 10 MG/ML IJ SOLN
40.0000 mg | Freq: Every day | INTRAMUSCULAR | Status: DC
  Administered 2024-09-18 – 2024-09-21 (×4): 40 mg via INTRAVENOUS
  Filled 2024-09-17 (×4): qty 4

## 2024-09-17 MED ORDER — POTASSIUM CHLORIDE CRYS ER 20 MEQ PO TBCR
40.0000 meq | EXTENDED_RELEASE_TABLET | Freq: Once | ORAL | Status: AC
  Administered 2024-09-17: 40 meq via ORAL
  Filled 2024-09-17: qty 2

## 2024-09-17 NOTE — TOC Initial Note (Signed)
 Transition of Care [facility]) - Initial/Assessment Note    Patient Details  Name: [name] MRN: [id number] Date of Birth: 10-15-1961  Transition of Care [facility]) CM/SW Contact:    [name], LCSW Phone Number: 09/17/2024, 3:44 PM  Clinical Narrative:   Readmission prevention screen complete. CSW met with patient. No family at bedside. CSW introduced role and explained that discharge planning would be discussed. PCP is Noretta Cave, NP. He uses a transportation service to get to appointments or his HCPOA Bari takes him. He uses the [facility]. No issues affording medications. Patient lives home alone. He confirmed home health with [facility]. He has a RW and 3-in-1 at home. He has home oxygen through [employer] and is interested in getting a POC. Will ask them about getting POC eval. No further concerns. CSW will continue to follow patient for support and facilitate return home once stable. His HCPOA will likely pick him up at discharge. If not, will probably need a cab voucher.               Expected Discharge Plan: Home w Home Health Services Barriers to Discharge: Continued Medical Work up   Patient Goals and CMS Choice            Expected Discharge Plan and Services     Post Acute Care Choice: Resumption of Svcs/PTA Provider Living arrangements for the past 2 months: Single Family Home                           HH Arranged: RN, PT, OT, Nurse's Aide HH Agency: [facility] Date [facility] Agency Contacted: 09/17/24   Representative spoke with at [facility] Agency: Daphne  Prior Living Arrangements/Services Living arrangements for the past 2 months: Single Family Home Lives with:: Self Patient language and need for interpreter reviewed:: Yes Do you feel safe going back to the place where you live?: Yes      Need for Family Participation in Patient Care: Yes (Comment)   Current home services: DME, Homehealth aide, Home OT, Home PT, Home  RN Criminal Activity/Legal Involvement Pertinent to Current Situation/Hospitalization: No - Comment as needed  Activities of Daily Living      Permission Sought/Granted Permission sought to share information with : Photographer granted to share info w AGENCY: [facility]        Emotional Assessment Appearance:: Appears stated age Attitude/Demeanor/Rapport: Engaged, Gracious Affect (typically observed): Accepting, Appropriate, Calm, Pleasant Orientation: : Oriented to Self, Oriented to Place, Oriented to  Time, Oriented to Situation Alcohol  / Substance Use: Not Applicable Psych Involvement: No (comment)  Admission diagnosis:  Lower abdominal pain [R10.30] Acute exacerbation of CHF (congestive heart failure) (HCC) [I50.9] Gastrointestinal hemorrhage, unspecified gastrointestinal hemorrhage type [K92.2] Anemia, unspecified type [D64.9] Acute on chronic congestive heart failure, unspecified heart failure type (HCC) [I50.9] Dissection of infrarenal aorta (HCC) [I71.02] Patient Active Problem List   Diagnosis Date Noted   Dissection of infrarenal aorta (HCC) 09/17/2024   Acute exacerbation of CHF (congestive heart failure) (HCC) 09/16/2024   Abnormality of abdominal aorta 09/16/2024   NSTEMI (non-ST elevated myocardial infarction) (HCC) 08/18/2024   Obesity (BMI 30-39.9) 08/17/2024   Hemorrhagic shock (HCC) 08/13/2024   History of CAD (coronary artery disease) 08/13/2024   COVID-19 virus infection 08/13/2024   Acute on chronic hypoxic respiratory failure (HCC) 08/13/2024   GI bleed 08/12/2024   Acute  respiratory distress 07/12/2024   Lobar pneumonia 07/12/2024   Metabolic acidosis, increased anion gap 07/12/2024   Myocardial injury 07/12/2024   COPD exacerbation (HCC) 07/11/2024   Elevated lactic acid level 07/11/2024   Coronary artery disease 07/02/2024   Acute on chronic diastolic CHF (congestive heart failure) (HCC)  06/25/2024   Severe aortic stenosis 06/24/2024   Depression 05/07/2024   Chronic obstructive pulmonary disease (COPD) (HCC) 05/07/2024   AVM (arteriovenous malformation) of small bowel, 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   Rectal bleeding 06/01/2023   GI bleeding 05/31/2023   CAD (coronary artery disease) 05/31/2023   Stroke (HCC) 05/31/2023   Iron  deficiency anemia 05/31/2023   Hyponatremia 05/13/2023   Alcohol  use disorder 05/13/2023   Elevated troponin 05/13/2023   Acute blood loss anemia 05/13/2023   Iron  deficiency anemia due to chronic blood loss 10/30/2022   Obesity hypoventilation syndrome (HCC) 10/29/2022   COPD with acute exacerbation (HCC) 10/28/2022   Melena 10/26/2022   Symptomatic anemia 10/25/2022   Acute diastolic CHF (congestive heart failure) (HCC) 10/25/2022   Essential hypertension 10/25/2022   GERD without esophagitis 10/25/2022   Dyslipidemia 10/25/2022   Special screening for malignant neoplasms, colon    Polyp of sigmoid colon    Orthostatic hypotension 06/04/2021   CKD stage 3a, GFR 45-59 ml/min (HCC) 10/05/2020   Alcohol  abuse    Syncope and collapse 05/31/2020   AKI (acute kidney injury) 05/31/2020   Hypokalemia 05/31/2020   Memory loss or impairment 12/24/2018   Memory loss of unknown cause 10/14/2018   Sleeping difficulty 10/14/2018   Mild aortic valve stenosis 10/08/2018   Hyperlipidemia 03/27/2018   Cognitive impairment 03/12/2018   History of stroke s/p left carotid endarterectomy 03/12/2018   Hypertension 03/01/2018   Atherosclerosis of native arteries of extremity with intermittent claudication 03/01/2018   Atherosclerotic peripheral vascular disease with intermittent claudication 01/03/2018   Benign essential HTN 12/07/2017   Bilateral carotid artery stenosis 12/07/2017   SOBOE (shortness of breath on exertion) 12/07/2017   Acute cholecystitis 07/24/2016   Coronary artery disease involving coronary bypass graft of native heart without angina pectoris     Tobacco abuse    PCP:  Osa Geralds, NP Pharmacy:   [facility] - [place], [place] - [address] [place] [address] Phone: [phone] Fax: [phone]  [facility] [address] [place] - [address] [place] [address] Phone: [phone] Fax: [phone]  [facility] [address] [place] [address] Phone: [phone] Fax: [phone]     Social Drivers of Health (SDOH) Social History: SDOH Screenings   Food Insecurity: No Food Insecurity (09/16/2024)  Housing: Low Risk  (09/17/2024)  Transportation Needs: No Transportation Needs (09/16/2024)  Utilities: Not At Risk (09/17/2024)  Social Connections: Moderately Isolated (09/17/2024)  Tobacco Use: High Risk (09/16/2024)   SDOH Interventions:     Readmission Risk Interventions    09/17/2024   12:28 PM 08/13/2024   12:01 PM 05/14/2024    8:44 AM  Readmission Risk Prevention Plan  Transportation Screening Complete Complete   PCP or Specialist Appt within 3-5 Days   Complete  HRI or Home Care Consult   Complete  Social Work Consult for Recovery Care Planning/Counseling   Complete  Palliative Care Screening   Not Applicable  Medication Review Oceanographer) Complete Complete Complete  PCP or Specialist appointment within 3-5 days of discharge Complete    HRI or Home Care Consult Complete  SW Recovery Care/Counseling Consult Complete    Palliative Care Screening Not Applicable Not Applicable   Skilled Nursing Facility Not Applicable Complete

## 2024-09-17 NOTE — Plan of Care (Signed)
  Problem: Clinical Measurements: Goal: Will remain free from infection Outcome: Progressing Goal: Respiratory complications will improve Outcome: Progressing Goal: Cardiovascular complication will be avoided Outcome: Progressing   Problem: Activity: Goal: Risk for activity intolerance will decrease Outcome: Progressing   Problem: Nutrition: Goal: Adequate nutrition will be maintained Outcome: Progressing   Problem: Elimination: Goal: Will not experience complications related to urinary retention Outcome: Progressing   Problem: Pain Managment: Goal: General experience of comfort will improve and/or be controlled Outcome: Progressing   Problem: Safety: Goal: Ability to remain free from injury will improve Outcome: Progressing   Problem: Skin Integrity: Goal: Risk for impaired skin integrity will decrease Outcome: Progressing

## 2024-09-17 NOTE — Progress Notes (Signed)
 PROGRESS NOTE    [name]  FMW:[id number] DOB: 07/22/1961 DOA: 09/16/2024 PCP: Osa Geralds, NP    Assessment & Plan:   Principal Problem:   Acute exacerbation of CHF (congestive heart failure) (HCC) Active Problems:   Coronary artery disease involving coronary bypass graft of native heart without angina pectoris   Iron  deficiency anemia due to chronic blood loss   CAD (coronary artery disease)   Essential hypertension   Elevated troponin   History of stroke s/p left carotid endarterectomy   AKI (acute kidney injury)   GERD without esophagitis   Dyslipidemia   Depression   Abnormality of abdominal aorta   Dissection of infrarenal aorta (HCC)  Assessment and Plan: Acute exacerbation diastolic CHF: echo on 05/10/2024 w/ EF 60 to 65%, grade 1 diastolic dysfunction. Continue on IV lasix . Monitor I/Os.  Hypokalemia: potassium given   Hx of CAD: w/ ventricular ectopy. Continue on home dose of amio   IDA: s/p 1 unit of pRBCs transfused so far. Will continue to monitor H&H  Infrarenal abd aorta dissection: ABI is pending. Vasc surg following and recs apprec    Elevated troponin: likely secondary to demand ischemia. Trending down   HTN: continue on home dose of metoprolol , imdur    HLD: continue on statin    AKI: Cr is trending up from day prior. Avoid nephrotoxic meds  Peripheral neuropathy: d/c home dose of gabapentin  and start pregabalin  Obesity: BMI 31.3. Would benefit from weight loss        DVT prophylaxis: SCDs Code Status: full Family Communication:  Disposition Plan: depends on PT/OT recs  Level of care: Telemetry Cardiac Consultants:    Procedures:   Antimicrobials:    Subjective: Pt c/o numbness and tingling of b/l LE  Objective: Vitals:   09/17/24 0100 09/17/24 0200 09/17/24 0419 09/17/24 0738  BP:   94/63 107/61  Pulse:   90 88  Resp: 19 16 19    Temp:   98.3 F (36.8 C) 98.4 F (36.9 C)  TempSrc:   Oral   SpO2:   97% 97%   Weight:      Height:        Intake/Output Summary (Last 24 hours) at 09/17/2024 1113 Last data filed at 09/17/2024 0738 Gross per 24 hour  Intake 315 ml  Output 2750 ml  Net -2435 ml   Filed Weights   09/16/24 0926  Weight: 104.8 kg    Examination:  General exam: Appears calm and comfortable  Respiratory system: diminished breath sounds b/l  Cardiovascular system: S1 & S2 +. 2+ pitting edema of b/l  Gastrointestinal system: Abdomen is nondistended, soft and nontender. Normal bowel sounds heard. Central nervous system: Alert and oriented. Moves all extremities Psychiatry: Judgement and insight appear normal. Mood & affect appropriate.     Data Reviewed: I have personally reviewed following labs and imaging studies  CBC: Recent Labs  Lab 09/16/24 0929 09/17/24 0514  WBC 9.0 12.1*  NEUTROABS 6.5  --   HGB 7.6* 8.1*  HCT 26.1* 27.1*  MCV 94.2 91.9  PLT 354 365   Basic Metabolic Panel: Recent Labs  Lab 09/16/24 0929 09/17/24 0514  NA 136 137  K 3.5 3.3*  CL 89* 89*  CO2 37* 35*  GLUCOSE 103* 122*  BUN 17 20  CREATININE 1.41* 1.43*  CALCIUM  8.4* 8.0*   GFR: Estimated Creatinine Clearance: 64.5 mL/min (A) (by C-G formula based on SCr of 1.43 mg/dL (H)). Liver Function Tests: Recent Labs  Lab 09/16/24  0929  AST 16  ALT 7  ALKPHOS 60  BILITOT 1.2  PROT 7.7  ALBUMIN  3.1*   No results for input(s): LIPASE, AMYLASE in the last 168 hours. No results for input(s): AMMONIA in the last 168 hours. Coagulation Profile: Recent Labs  Lab 09/16/24 0929  INR 1.2   Cardiac Enzymes: No results for input(s): CKTOTAL, CKMB, CKMBINDEX, TROPONINI in the last 168 hours. BNP (last 3 results) No results for input(s): PROBNP in the last 8760 hours. HbA1C: No results for input(s): HGBA1C in the last 72 hours. CBG: No results for input(s): GLUCAP in the last 168 hours. Lipid Profile: No results for input(s): CHOL, HDL, LDLCALC, TRIG,  CHOLHDL, LDLDIRECT in the last 72 hours. Thyroid Function Tests: No results for input(s): TSH, T4TOTAL, FREET4, T3FREE, THYROIDAB in the last 72 hours. Anemia Panel: No results for input(s): VITAMINB12, FOLATE, FERRITIN, TIBC, IRON , RETICCTPCT in the last 72 hours. Sepsis Labs: No results for input(s): PROCALCITON, LATICACIDVEN in the last 168 hours.  No results found for this or any previous visit (from the past 240 hours).       Radiology Studies: CT ANGIO GI BLEED Result Date: 09/16/2024 CLINICAL DATA:  Lower abdominal pain, hemoccult positive stool EXAM: CTA ABDOMEN AND PELVIS WITHOUT AND WITH CONTRAST TECHNIQUE: Multidetector CT imaging of the abdomen and pelvis was performed using the standard protocol during bolus administration of intravenous contrast. Multiplanar reconstructed images and MIPs were obtained and reviewed to evaluate the vascular anatomy. Noncontrast CT images of the abdomen and pelvis were also obtained. RADIATION DOSE REDUCTION: This exam was performed according to the departmental dose-optimization program which includes automated exposure control, adjustment of the mA and/or kV according to patient size and/or use of iterative reconstruction technique. CONTRAST:  OMNIPAQUE  IOHEXOL  350 MG/ML SOLN COMPARISON:  August 21, 2024 FINDINGS: VASCULAR Aorta: Tortuous abdominal aorta with ectasia of the infrarenal aorta, measuring up to 2.8 cm with associated focal dissection of the aorta. Celiac: Likely moderate stenosis of the proximal celiac artery. SMA: At least moderate stenosis of the proximal superior mesenteric artery. Renals: Moderate to severe stenosis of the bilateral renal arteries. IMA: Patent with no significant stenosis. Inflow: Atherosclerotic plaque in the bilateral iliac arteries with likely moderate multifocal stenoses in the bilateral common iliac arteries, penetrating atheromatous plaque in the right common iliac artery,  moderate to severe stenoses of the bilateral internal iliac arteries, and likely mild multifocal stenoses in the external iliac artery. Proximal Outflow: Likely mild stenoses in the bilateral common femoral arteries. Likely severe stenosis of the proximal right superficial femoral artery and at least moderate stenosis of the proximal left superficial femoral artery. Bilateral profundus femoral arteries are patent. Veins: Portal vein, splenic vein, and SMV are patent. No obvious acute venous abnormality on this arterial phase study. No evidence of arterial contrast extravasation to suggest active GI bleeding. Review of the MIP images confirms the above findings. NON-VASCULAR Lower chest: Small bilateral pleural effusions. Hepatobiliary: No suspicious liver lesions. Gallbladder is surgically absent. No biliary ductal dilation. Pancreas: Unremarkable. Spleen: Unremarkable. Adrenals/Urinary Tract: Adrenal glands are unremarkable. No nephrolithiasis or hydronephrosis. Stomach/Bowel: No evidence of bowel obstruction or inflammation. Appendix is not well visualized. Lymphatic: No lymphadenopathy by size criteria. Reproductive: Unremarkable. Other: Trace volume ascites. No free air. Small fat containing bilateral inguinal hernias. Musculoskeletal: Unchanged mild chronic T12 vertebral body compression deformity. IMPRESSION: VASCULAR 1. No evidence of active arterial GI bleeding. 2. Extensive atherosclerotic plaque as described above. Focal non flow limiting dissection of the  infrarenal abdominal aorta. 3. Likely moderate stenosis of the proximal celiac artery and superior mesenteric artery. 4. At least moderate stenoses in the bilateral renal arteries. 5. Likely moderate multifocal stenoses in the bilateral common iliac arteries and moderate to severe stenoses of the bilateral internal iliac arteries. 6. Severe stenosis of the visualized proximal right superficial femoral artery. Moderate to severe stenosis of the visualized  left proximal superficial femoral artery. NON-VASCULAR 1. Small bilateral pleural effusions and trace volume ascites. Electronically Signed   By: Michaeline Blanch M.D.   On: 09/16/2024 11:57   DG Chest 1 View Result Date: 09/16/2024 CLINICAL DATA:  Chronic shortness of breath with bilateral leg swelling and dizziness. EXAM: CHEST  1 VIEW COMPARISON:  Chest x-ray 08/16/2024, chest CT 08/21/2024 FINDINGS: Lungs are adequately inflated. There is mild prominence and indistinctness of the central pulmonary vessels suggesting a degree of vascular congestion. Minimal hazy density over the medial lung bases/retrocardiac regions which may be due to small amount of layering pleural fluid/atelectasis. Stable nodule opacity over the lateral right mid to upper lung as seen on recent chest CT. Stable cardiomegaly. Remainder the exam is unchanged. IMPRESSION: 1. Stable cardiomegaly with suggestion of mild vascular congestion. 2. Minimal hazy density over the medial lung bases/retrocardiac regions which may be due to small amount of layering pleural fluid/atelectasis. 3. Stable nodule opacity over the lateral right mid to upper lung as seen on recent chest CT. Electronically Signed   By: Toribio Agreste M.D.   On: 09/16/2024 10:28        Scheduled Meds:  sodium chloride   250 mL Intravenous Once   amiodarone   200 mg Oral Daily   ascorbic acid   500 mg Oral Daily   budesonide -glycopyrrolate -formoterol   2 puff Inhalation BID   dapagliflozin  propanediol  10 mg Oral Daily   Fe Fum-Vit C-Vit B12-FA  1 capsule Oral BID   feeding supplement  237 mL Oral BID BM   folic acid   1 mg Oral Daily   gabapentin   300 mg Oral BID   isosorbide  mononitrate  30 mg Oral Daily   metoprolol  succinate  25 mg Oral Daily   multivitamin with minerals  1 tablet Oral Daily   nicotine   14 mg Transdermal Daily   pantoprazole  (PROTONIX ) IV  40 mg Intravenous BID   polyethylene glycol  17 g Oral Q breakfast   rosuvastatin   20 mg Oral QHS    thiamine   100 mg Oral Daily   Continuous Infusions:   LOS: 1 day      Anthony CHRISTELLA Pouch, MD [facility] Pager [phone]  If 7PM-7AM, please contact night-coverage [email or web address] 09/17/2024, 11:13 AM

## 2024-09-17 NOTE — TOC CM/SW Note (Signed)
 CSW went by the room for readmission prevention screen but patient was off the unit. Will try again later. [facility] confirmed they are still active with him for PT, OT, RN, aide.  Lauraine Carpen, CSW [phone]

## 2024-09-18 ENCOUNTER — Other Ambulatory Visit: Payer: Self-pay

## 2024-09-18 DIAGNOSIS — I5033 Acute on chronic diastolic (congestive) heart failure: Secondary | ICD-10-CM | POA: Diagnosis not present

## 2024-09-18 LAB — CBC
HCT: 26.1 % — ABNORMAL LOW (ref 39.0–52.0)
Hemoglobin: 7.6 g/dL — ABNORMAL LOW (ref 13.0–17.0)
MCH: 27.3 pg (ref 26.0–34.0)
MCHC: 29.1 g/dL — ABNORMAL LOW (ref 30.0–36.0)
MCV: 93.9 fL (ref 80.0–100.0)
Platelets: 365 10*3/uL (ref 150–400)
RBC: 2.78 MIL/uL — ABNORMAL LOW (ref 4.22–5.81)
RDW: 16.5 % — ABNORMAL HIGH (ref 11.5–15.5)
WBC: 9.8 10*3/uL (ref 4.0–10.5)
nRBC: 0 % (ref 0.0–0.2)

## 2024-09-18 LAB — BASIC METABOLIC PANEL WITH GFR
Anion gap: 10 (ref 5–15)
BUN: 22 mg/dL (ref 8–23)
CO2: 35 mmol/L — ABNORMAL HIGH (ref 22–32)
Calcium: 8.1 mg/dL — ABNORMAL LOW (ref 8.9–10.3)
Chloride: 91 mmol/L — ABNORMAL LOW (ref 98–111)
Creatinine, Ser: 1.67 mg/dL — ABNORMAL HIGH (ref 0.61–1.24)
GFR, Estimated: 45 mL/min — ABNORMAL LOW
Glucose, Bld: 153 mg/dL — ABNORMAL HIGH (ref 70–99)
Potassium: 3.7 mmol/L (ref 3.5–5.1)
Sodium: 136 mmol/L (ref 135–145)

## 2024-09-18 NOTE — Care Management Important Message (Signed)
 Important Message  Patient Details  Name: [name] MRN: [id number] Date of Birth: 1961/07/07   Important Message Given:  Yes - Medicare IM     [name] 09/18/2024, 4:38 PM

## 2024-09-18 NOTE — Evaluation (Signed)
 Physical Therapy Evaluation Patient Details Name: [name] MRN: [id number] DOB: Aug 28, 1961 Today's Date: 09/18/2024  History of Present Illness  Patient is a 63 year old male with bilateral LE swelling, dizziness, abdominal pain, acute exacerbation of diastolic CHF. PMH: heart failure preserved ejection fraction, morbid obesity, hypertension, neuropathy, insomnia, hyperlipidemia, coronary artery disease, GERD, chronic oxygen requirement on 2 L nasal cannula  Clinical Impression  Patient is agreeable to PT session. He lives home alone and reports having home health PT. He is a limited distance ambulator at baseline and uses oxygen at home.  Today the patient was seated on edge of bed on arrival to room. He was able to stand and walk short distance in the room with CGA. He may benefit from using assistive device but declined to use at this time. Dyspnea with exertion, Sp02 93% on 3 L02. Recommend to continue PT to maximize independence while in the hospital with resuming home health PT after this hospital stay.       If plan is discharge home, recommend the following: Assist for transportation;Assistance with cooking/housework   Can travel by private vehicle        Equipment Recommendations None recommended by PT  Recommendations for Other Services       Functional Status Assessment Patient has had a recent decline in their functional status and demonstrates the ability to make significant improvements in function in a reasonable and predictable amount of time.     Precautions / Restrictions Precautions Precautions: Fall Recall of Precautions/Restrictions: Intact Restrictions Weight Bearing Restrictions Per Provider Order: No      Mobility  Bed Mobility               General bed mobility comments: not assessed as patient sitting up on arrival and post session    Transfers Overall transfer level: Needs assistance Equipment used: None Transfers: Sit to/from  Stand Sit to Stand: Contact guard assist           General transfer comment: patient declined using rolling walker. no lifting assistance required    Ambulation/Gait Ambulation/Gait assistance: Contact guard assist Gait Distance (Feet): 15 Feet Assistive device: None Gait Pattern/deviations: Step-through pattern, Decreased stride length Gait velocity: decreased     General Gait Details: occasionally reaching for bed for support. may benefit from using rolling walker if agreeable. mild dyspnea with exertion with Sp02 93% on 3 L02  Stairs            Wheelchair Mobility     Tilt Bed    Modified Rankin (Stroke Patients Only)       Balance Overall balance assessment: Needs assistance Sitting-balance support: Feet supported Sitting balance-Leahy Scale: Fair     Standing balance support: Single extremity supported Standing balance-Leahy Scale: Fair                               Pertinent Vitals/Pain Pain Assessment Pain Assessment: No/denies pain    Home Living Family/patient expects to be discharged to:: Private residence Living Arrangements: Alone Available Help at Discharge: Friend(s);Available PRN/intermittently Type of Home: House Home Access: Level entry       Home Layout: Two level;Able to live on main level with bedroom/bathroom Home Equipment: Rolling Walker (2 wheels);BSC/3in1;Shower seat Additional Comments: 2-3 L at home per patient report. He reports getting home health PT    Prior Function Prior Level of Function : Independent/Modified Independent  Mobility Comments: no falls reports since last hospital stay. reports using the rolling walker while PT is in the home. otherwise no device. limited ambulation at baseline       Extremity/Trunk Assessment   Upper Extremity Assessment Upper Extremity Assessment: Overall WFL for tasks assessed    Lower Extremity Assessment Lower Extremity Assessment: Generalized  weakness       Communication   Communication Factors Affecting Communication: Hearing impaired    Cognition Arousal: Alert Behavior During Therapy: WFL for tasks assessed/performed   PT - Cognitive impairments: No apparent impairments                         Following commands: Intact       Cueing Cueing Techniques: Verbal cues     General Comments General comments (skin integrity, edema, etc.): encouraged patient to elevate LE for edema management    Exercises     Assessment/Plan    PT Assessment Patient needs continued PT services  PT Problem List Decreased strength;Decreased activity tolerance;Decreased balance;Decreased mobility;Decreased safety awareness;Decreased knowledge of use of DME       PT Treatment Interventions DME instruction;Gait training;Functional mobility training;Therapeutic activities;Therapeutic exercise;Balance training;Neuromuscular re-education;Patient/family education    PT Goals (Current goals can be found in the Care Plan section)  Acute Rehab PT Goals Patient Stated Goal: to go home PT Goal Formulation: With patient Time For Goal Achievement: 10/02/24 Potential to Achieve Goals: Good    Frequency Min 2X/week     Co-evaluation               AM-PAC PT 6 Clicks Mobility  Outcome Measure Help needed turning from your back to your side while in a flat bed without using bedrails?: None Help needed moving from lying on your back to sitting on the side of a flat bed without using bedrails?: A Little Help needed moving to and from a bed to a chair (including a wheelchair)?: A Little Help needed standing up from a chair using your arms (e.g., wheelchair or bedside chair)?: A Little Help needed to walk in hospital room?: A Little Help needed climbing 3-5 steps with a railing? : A Little 6 Click Score: 19    End of Session   Activity Tolerance: Patient tolerated treatment well Patient left: in chair;with call bell/phone  within reach;with chair alarm set;with nursing/sitter in room Nurse Communication: Mobility status PT Visit Diagnosis: Unsteadiness on feet (R26.81);Muscle weakness (generalized) (M62.81)    Time: 9174-9152 PT Time Calculation (min) (ACUTE ONLY): 22 min   Charges:   PT Evaluation $PT Eval Low Complexity: 1 Low   PT General Charges $$ ACUTE PT VISIT: 1 Visit        Randine Essex, PT, MPT   Randine LULLA Essex 09/18/2024, 10:28 AM

## 2024-09-18 NOTE — Evaluation (Signed)
 Occupational Therapy Evaluation Patient Details Name: [name] MRN: [id number] DOB: [birth year] Today's Date: 09/18/2024   History of Present Illness   Patient is a 63 year old male with bilateral LE swelling, dizziness, abdominal pain, acute exacerbation of diastolic CHF. PMH: heart failure preserved ejection fraction, morbid obesity, hypertension, neuropathy, insomnia, hyperlipidemia, coronary artery disease, GERD, chronic oxygen requirement on 2 L nasal cannula     Clinical Impressions Patient presenting with decreased Ind in self care,balance, functional mobility/transfers, endurance, and safety awareness. Patient reports living at home alone with support from landlord/friend for meal,groceries, and medication management. Pt endorses short distance ambulation and being on 2-3 L at baseline. Pt has been getting some HH services and an aide comes 1x/wk to assist with shower. Pt needing min guard without AD for functional transfer and 10' ambulation in room without use of AD.  Pt is likely close to baseline.  Patient will benefit from acute OT to increase overall independence in the areas of ADLs, functional mobility, and safety awareness in order to safely discharge.      If plan is discharge home, recommend the following:   A little help with walking and/or transfers;A little help with bathing/dressing/bathroom;Assistance with cooking/housework;Assist for transportation     Functional Status Assessment   Patient has had a recent decline in their functional status and demonstrates the ability to make significant improvements in function in a reasonable and predictable amount of time.     Equipment Recommendations   BSC/3in1      Precautions/Restrictions   Precautions Precautions: Fall Recall of Precautions/Restrictions: Intact     Mobility Bed Mobility Overal bed mobility: Needs Assistance Bed Mobility: Sit to Supine       Sit to supine: Supervision         Transfers Overall transfer level: Needs assistance Equipment used: 1 person hand held assist Transfers: Sit to/from Stand, Bed to chair/wheelchair/BSC Sit to Stand: Contact guard assist     Step pivot transfers: Contact guard assist            Balance Overall balance assessment: Needs assistance Sitting-balance support: Feet supported Sitting balance-Leahy Scale: Fair     Standing balance support: Single extremity supported Standing balance-Leahy Scale: Fair                             ADL either performed or assessed with clinical judgement   ADL Overall ADL's : Needs assistance/impaired     Grooming: Set up;Wash/dry hands;Wash/dry Scientist, research (physical sciences): Set up                   Vision Patient Visual Report: No change from baseline              Pertinent Vitals/Pain Pain Assessment Pain Assessment: No/denies pain     Extremity/Trunk Assessment Upper Extremity Assessment Upper Extremity Assessment: Overall WFL for tasks assessed   Lower Extremity Assessment Lower Extremity Assessment: Generalized weakness       Communication Communication Communication: Impaired Factors Affecting Communication: Hearing impaired   Cognition Arousal: Alert Behavior During Therapy: WFL for tasks assessed/performed Cognition: No apparent impairments                               Following commands: Intact  Cueing  General Comments   Cueing Techniques: Verbal cues              Home Living Family/patient expects to be discharged to:: Private residence Living Arrangements: Alone Available Help at Discharge: Friend(s);Available PRN/intermittently Type of Home: House Home Access: Level entry     Home Layout: Two level;Able to live on main level with bedroom/bathroom     Bathroom Shower/Tub: Tub/shower unit   Bathroom Toilet: Standard Bathroom Accessibility: Yes   Home Equipment: Clinical biochemist (2 wheels);BSC/3in1;Shower seat   Additional Comments: 2-3 L at home per patient report. He reports getting home health PT      Prior Functioning/Environment Prior Level of Function : Independent/Modified Independent             Mobility Comments: no falls reports since last hospital stay. reports using the rolling walker while PT is in the home. otherwise no device. limited ambulation at baseline ADLs Comments: pt reports he performs ADL grossly with MOD I at baseline, recently requiring assist at rehab for ADLs/IADLs; at baseline landlords wife assists with med management and meals at times he also reports he goes out to eat frequently; per chart he does have an aid to assist with bathing(1x/wk assists him getting into shower)    OT Problem List: Decreased strength;Impaired balance (sitting and/or standing);Pain;Decreased safety awareness;Decreased activity tolerance   OT Treatment/Interventions: Self-care/ADL training;Manual therapy;Therapeutic exercise;Patient/family education;Balance training;Energy conservation;Therapeutic activities;DME and/or AE instruction      OT Goals(Current goals can be found in the care plan section)   Acute Rehab OT Goals Patient Stated Goal: to go home OT Goal Formulation: With patient Time For Goal Achievement: 10/02/24 Potential to Achieve Goals: Fair ADL Goals Pt Will Perform Grooming: standing;with modified independence Pt Will Perform Lower Body Dressing: with modified independence;sit to/from stand Pt Will Transfer to Toilet: with modified independence;ambulating Pt Will Perform Toileting - Clothing Manipulation and hygiene: with modified independence;sit to/from stand   OT Frequency:  Min 2X/week       AM-PAC OT 6 Clicks Daily Activity     Outcome Measure Help from another person eating meals?: None Help from another person taking care of personal grooming?: None Help from another person toileting, which includes using  toliet, bedpan, or urinal?: A Little Help from another person bathing (including washing, rinsing, drying)?: A Little Help from another person to put on and taking off regular upper body clothing?: None Help from another person to put on and taking off regular lower body clothing?: A Little 6 Click Score: 21   End of Session Nurse Communication: Mobility status  Activity Tolerance: Patient tolerated treatment well Patient left: in bed;with call bell/phone within reach;with bed alarm set  OT Visit Diagnosis: Unsteadiness on feet (R26.81);Repeated falls (R29.6);Muscle weakness (generalized) (M62.81)                Time: 8943-8885 OT Time Calculation (min): 18 min Charges:  OT General Charges $OT Visit: 1 Visit OT Evaluation $OT Eval Low Complexity: 1 Low OT Treatments $Therapeutic Activity: 8-22 mins  Izetta Claude, MS, OTR/L , CBIS ascom [phone]  09/18/24, 1:41 PM

## 2024-09-18 NOTE — Plan of Care (Signed)

## 2024-09-18 NOTE — Progress Notes (Signed)
 Heart Failure Navigator Progress Note  Assessed for Heart & Vascular TOC clinic readiness.  Patient does not meet criteria due to current [facility] cardiology patient.   Navigator will sign off at this time.  Charmaine Pines, RN, BSN [facility] Heart Failure Navigator Secure Chat Only

## 2024-09-18 NOTE — Progress Notes (Addendum)
 PROGRESS NOTE    [name]  FMW:[id number] DOB: [birth year] DOA: 09/16/2024 PCP: Osa Geralds, NP    Assessment & Plan:   Principal Problem:   Acute exacerbation of CHF (congestive heart failure) (HCC) Active Problems:   Coronary artery disease involving coronary bypass graft of native heart without angina pectoris   Iron  deficiency anemia due to chronic blood loss   CAD (coronary artery disease)   Essential hypertension   Elevated troponin   History of stroke s/p left carotid endarterectomy   AKI (acute kidney injury)   GERD without esophagitis   Dyslipidemia   Depression   Abnormality of abdominal aorta   Dissection of infrarenal aorta (HCC)  Assessment and Plan: Acute exacerbation diastolic CHF: echo on 05/10/2024 w/ EF 60 to 65%, grade 1 diastolic dysfunction. Significant b/l LE pitting edema. Continue on IV lasix . Monitor I/Os  Chronic hypoxic respiratory failure: continue on home supplemental oxygen   Hypokalemia: WNL today  Hx of CAD: w/ ventricular ectopy. Continue on home dose of amio    IDA: s/p 1 unit of pRBCs transfused so far. Will transfuse if Hb < 7.0   Infrarenal abd aorta dissection: ABI shows mild-mod PAD. Will need to outpatient w/ vasc surg for likely intervention as per vasc surg. Vasc surg following and recs apprec    Elevated troponin: likely secondary to demand ischemia. Trending down   HTN: continue on home dose of imdur , metoprolol     HLD: continue on statin    AKI: Cr is trending up again today, possibly secondary to lasix  use. Will hold lasix  tomorrow if Cr continues to trend up   Peripheral neuropathy: d/c home gabapentin  on 10/1 and started pregabalin 09/18/24  Obesity: BMI 31.3. Would benefit from weight loss        DVT prophylaxis: SCDs Code Status: full Family Communication:  Disposition Plan: depends on PT/OT recs  Level of care: Telemetry Cardiac Consultants:    Procedures:   Antimicrobials:     Subjective: Pt c/o dry nose & throat  Objective: Vitals:   09/17/24 2014 09/17/24 2311 09/18/24 0436 09/18/24 0753  BP: (!) 153/129 (!) 102/59 119/89 114/70  Pulse: 89 88 84 84  Resp: 19 20 20 18   Temp: 98.4 F (36.9 C) 98.1 F (36.7 C) 98.1 F (36.7 C) 97.8 F (36.6 C)  TempSrc: Oral Oral Oral   SpO2: 93% 97% 98% 94%  Weight:      Height:        Intake/Output Summary (Last 24 hours) at 09/18/2024 0808 Last data filed at 09/18/2024 0500 Gross per 24 hour  Intake 1320 ml  Output 1900 ml  Net -580 ml   Filed Weights   09/16/24 0926  Weight: 104.8 kg    Examination:  General exam: appears comfortable  Respiratory system: decreased breath sounds b/l  Cardiovascular system: S1/S2+. 2+ pitting edema of b/l LE Gastrointestinal system: abd is soft, NT, obese & hypoactive bowel sounds  Central nervous system: alert & awake. Moves all extremities  Psychiatry: judgement and insight appears at baseline. Flat mood and affect    Data Reviewed: I have personally reviewed following labs and imaging studies  CBC: Recent Labs  Lab 09/16/24 0929 09/17/24 0514 09/18/24 0357  WBC 9.0 12.1* 9.8  NEUTROABS 6.5  --   --   HGB 7.6* 8.1* 7.6*  HCT 26.1* 27.1* 26.1*  MCV 94.2 91.9 93.9  PLT 354 365 365   Basic Metabolic Panel: Recent Labs  Lab 09/16/24 0929 09/17/24 0514  09/18/24 0357  NA 136 137 136  K 3.5 3.3* 3.7  CL 89* 89* 91*  CO2 37* 35* 35*  GLUCOSE 103* 122* 153*  BUN 17 20 22   CREATININE 1.41* 1.43* 1.67*  CALCIUM  8.4* 8.0* 8.1*   GFR: Estimated Creatinine Clearance: 55.2 mL/min (A) (by C-G formula based on SCr of 1.67 mg/dL (H)). Liver Function Tests: Recent Labs  Lab 09/16/24 0929  AST 16  ALT 7  ALKPHOS 60  BILITOT 1.2  PROT 7.7  ALBUMIN  3.1*   No results for input(s): LIPASE, AMYLASE in the last 168 hours. No results for input(s): AMMONIA in the last 168 hours. Coagulation Profile: Recent Labs  Lab 09/16/24 0929  INR 1.2    Cardiac Enzymes: No results for input(s): CKTOTAL, CKMB, CKMBINDEX, TROPONINI in the last 168 hours. BNP (last 3 results) No results for input(s): PROBNP in the last 8760 hours. HbA1C: No results for input(s): HGBA1C in the last 72 hours. CBG: No results for input(s): GLUCAP in the last 168 hours. Lipid Profile: No results for input(s): CHOL, HDL, LDLCALC, TRIG, CHOLHDL, LDLDIRECT in the last 72 hours. Thyroid Function Tests: No results for input(s): TSH, T4TOTAL, FREET4, T3FREE, THYROIDAB in the last 72 hours. Anemia Panel: No results for input(s): VITAMINB12, FOLATE, FERRITIN, TIBC, IRON , RETICCTPCT in the last 72 hours. Sepsis Labs: No results for input(s): PROCALCITON, LATICACIDVEN in the last 168 hours.  No results found for this or any previous visit (from the past 240 hours).       Radiology Studies: US  ARTERIAL LOWER EXTREMITY DUPLEX BILATERAL Result Date: 09/17/2024 CLINICAL DATA:  Lower extremity claudication, weak Doppler pulses EXAM: BILATERAL LOWER EXTREMITY ARTERIAL DUPLEX SCAN TECHNIQUE: Gray-scale sonography as well as color Doppler and duplex ultrasound was performed to evaluate the arteries of both lower extremities including the common, superficial and profunda femoral arteries, popliteal artery and calf arteries. COMPARISON:  None Available. FINDINGS: Right Lower Extremity ABI: 0.85 Inflow: Normal common femoral arterial waveforms and velocities. No evidence of inflow (aortoiliac) disease. Outflow: Extensive atherosclerotic plaque in the common and superficial femoral artery. There appears to be a short segment occlusion of the proximal SFA with collateral reconstitution. Flow is minimal in the mid thigh. Runoff: Normal posterior and anterior tibial arterial waveforms and velocities. Vessels are patent to the ankle. Left Lower Extremity ABI: 0.84 Inflow: Normal common femoral arterial waveforms and velocities. No  evidence of inflow (aortoiliac) disease. Outflow: Extensive calcified atherosclerotic plaque throughout the femoral system. Shadowing obscures the artery in several places. Suspect superficial femoral artery disease. Runoff: Normal posterior and anterior tibial arterial waveforms and velocities. Vessels are patent to the ankle. IMPRESSION: 1. Abnormal bilateral resting ankle-brachial indices consistent with at least mild peripheral arterial disease. 2. Extensive calcified atherosclerotic plaque throughout the superficial femoral arteries bilaterally. Suspect significant stenosis versus focal occlusion of the SFAs bilaterally. Electronically Signed   By: Wilkie Lent M.D.   On: 09/17/2024 15:58   US  ARTERIAL ABI (SCREENING LOWER EXTREMITY) Result Date: 09/17/2024 CLINICAL DATA:  ABI screening in a patient history of hyperlipidemia,, hypertension smoking, and previous vascular surgery EXAM: NONINVASIVE PHYSIOLOGIC VASCULAR STUDY OF BILATERAL LOWER EXTREMITIES TECHNIQUE: Evaluation of both lower extremities were performed at rest, including calculation of ankle-brachial indices with single level Doppler, pressure and pulse volume recording. COMPARISON:  None Available. FINDINGS: Right ABI:  0.85 Left ABI:  0.84 Right Lower Extremity:  Biphasic arterial waveforms at the ankle. Left Lower Extremity:  Biphasic arterial waveforms at the ankle. IMPRESSION: Mild PVD bilateral  lower extremities Electronically Signed   By: Cordella Banner   On: 09/17/2024 15:13   CT ANGIO GI BLEED Result Date: 09/16/2024 CLINICAL DATA:  Lower abdominal pain, hemoccult positive stool EXAM: CTA ABDOMEN AND PELVIS WITHOUT AND WITH CONTRAST TECHNIQUE: Multidetector CT imaging of the abdomen and pelvis was performed using the standard protocol during bolus administration of intravenous contrast. Multiplanar reconstructed images and MIPs were obtained and reviewed to evaluate the vascular anatomy. Noncontrast CT images of the abdomen and  pelvis were also obtained. RADIATION DOSE REDUCTION: This exam was performed according to the departmental dose-optimization program which includes automated exposure control, adjustment of the mA and/or kV according to patient size and/or use of iterative reconstruction technique. CONTRAST:  OMNIPAQUE  IOHEXOL  350 MG/ML SOLN COMPARISON:  August 21, 2024 FINDINGS: VASCULAR Aorta: Tortuous abdominal aorta with ectasia of the infrarenal aorta, measuring up to 2.8 cm with associated focal dissection of the aorta. Celiac: Likely moderate stenosis of the proximal celiac artery. SMA: At least moderate stenosis of the proximal superior mesenteric artery. Renals: Moderate to severe stenosis of the bilateral renal arteries. IMA: Patent with no significant stenosis. Inflow: Atherosclerotic plaque in the bilateral iliac arteries with likely moderate multifocal stenoses in the bilateral common iliac arteries, penetrating atheromatous plaque in the right common iliac artery, moderate to severe stenoses of the bilateral internal iliac arteries, and likely mild multifocal stenoses in the external iliac artery. Proximal Outflow: Likely mild stenoses in the bilateral common femoral arteries. Likely severe stenosis of the proximal right superficial femoral artery and at least moderate stenosis of the proximal left superficial femoral artery. Bilateral profundus femoral arteries are patent. Veins: Portal vein, splenic vein, and SMV are patent. No obvious acute venous abnormality on this arterial phase study. No evidence of arterial contrast extravasation to suggest active GI bleeding. Review of the MIP images confirms the above findings. NON-VASCULAR Lower chest: Small bilateral pleural effusions. Hepatobiliary: No suspicious liver lesions. Gallbladder is surgically absent. No biliary ductal dilation. Pancreas: Unremarkable. Spleen: Unremarkable. Adrenals/Urinary Tract: Adrenal glands are unremarkable. No nephrolithiasis or  hydronephrosis. Stomach/Bowel: No evidence of bowel obstruction or inflammation. Appendix is not well visualized. Lymphatic: No lymphadenopathy by size criteria. Reproductive: Unremarkable. Other: Trace volume ascites. No free air. Small fat containing bilateral inguinal hernias. Musculoskeletal: Unchanged mild chronic T12 vertebral body compression deformity. IMPRESSION: VASCULAR 1. No evidence of active arterial GI bleeding. 2. Extensive atherosclerotic plaque as described above. 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 3. Likely moderate stenosis of the proximal celiac artery and superior mesenteric artery. 4. At least moderate stenoses in the bilateral renal arteries. 5. Likely moderate multifocal stenoses in the bilateral common iliac arteries and moderate to severe stenoses of the bilateral internal iliac arteries. 6. Severe stenosis of the visualized proximal right superficial femoral artery. Moderate to severe stenosis of the visualized left proximal superficial femoral artery. NON-VASCULAR 1. Small bilateral pleural effusions and trace volume ascites. Electronically Signed   By: Michaeline Blanch M.D.   On: 09/16/2024 11:57   DG Chest 1 View Result Date: 09/16/2024 CLINICAL DATA:  Chronic shortness of breath with bilateral leg swelling and dizziness. EXAM: CHEST  1 VIEW COMPARISON:  Chest x-ray 08/16/2024, chest CT 08/21/2024 FINDINGS: Lungs are adequately inflated. There is mild prominence and indistinctness of the central pulmonary vessels suggesting a degree of vascular congestion. Minimal hazy density over the medial lung bases/retrocardiac regions which may be due to small amount of layering pleural fluid/atelectasis. Stable nodule opacity over  the lateral right mid to upper lung as seen on recent chest CT. Stable cardiomegaly. Remainder the exam is unchanged. IMPRESSION: 1. Stable cardiomegaly with suggestion of mild vascular congestion. 2. Minimal hazy density over the  medial lung bases/retrocardiac regions which may be due to small amount of layering pleural fluid/atelectasis. 3. Stable nodule opacity over the lateral right mid to upper lung as seen on recent chest CT. Electronically Signed   By: Toribio Agreste M.D.   On: 09/16/2024 10:28        Scheduled Meds:  sodium chloride   250 mL Intravenous Once   amiodarone   200 mg Oral Daily   ascorbic acid   500 mg Oral Daily   budesonide -glycopyrrolate -formoterol   2 puff Inhalation BID   dapagliflozin  propanediol  10 mg Oral Daily   Fe Fum-Vit C-Vit B12-FA  1 capsule Oral BID   feeding supplement  237 mL Oral BID BM   folic acid   1 mg Oral Daily   furosemide   40 mg Intravenous Daily   isosorbide  mononitrate  30 mg Oral Daily   metoprolol  succinate  25 mg Oral Daily   multivitamin with minerals  1 tablet Oral Daily   nicotine   14 mg Transdermal Daily   pantoprazole  (PROTONIX ) IV  40 mg Intravenous BID   polyethylene glycol  17 g Oral Q breakfast   pregabalin  50 mg Oral TID   rosuvastatin   20 mg Oral QHS   thiamine   100 mg Oral Daily   Continuous Infusions:   LOS: 2 days      Anthony CHRISTELLA Pouch, MD [facility] Pager [phone]  If 7PM-7AM, please contact night-coverage [email or web address] 09/18/2024, 8:08 AM

## 2024-09-19 ENCOUNTER — Other Ambulatory Visit: Payer: Self-pay

## 2024-09-19 DIAGNOSIS — I5033 Acute on chronic diastolic (congestive) heart failure: Secondary | ICD-10-CM | POA: Diagnosis not present

## 2024-09-19 LAB — CBC
HCT: 24.7 % — ABNORMAL LOW (ref 39.0–52.0)
Hemoglobin: 7.3 g/dL — ABNORMAL LOW (ref 13.0–17.0)
MCH: 27.7 pg (ref 26.0–34.0)
MCHC: 29.6 g/dL — ABNORMAL LOW (ref 30.0–36.0)
MCV: 93.6 fL (ref 80.0–100.0)
Platelets: 360 K/uL (ref 150–400)
RBC: 2.64 MIL/uL — ABNORMAL LOW (ref 4.22–5.81)
RDW: 16.5 % — ABNORMAL HIGH (ref 11.5–15.5)
WBC: 9.9 K/uL (ref 4.0–10.5)
nRBC: 0 % (ref 0.0–0.2)

## 2024-09-19 LAB — BASIC METABOLIC PANEL WITH GFR
Anion gap: 9 (ref 5–15)
BUN: 21 mg/dL (ref 8–23)
CO2: 36 mmol/L — ABNORMAL HIGH (ref 22–32)
Calcium: 8.2 mg/dL — ABNORMAL LOW (ref 8.9–10.3)
Chloride: 91 mmol/L — ABNORMAL LOW (ref 98–111)
Creatinine, Ser: 1.56 mg/dL — ABNORMAL HIGH (ref 0.61–1.24)
GFR, Estimated: 49 mL/min — ABNORMAL LOW (ref >60)
Glucose, Bld: 136 mg/dL — ABNORMAL HIGH (ref 70–99)
Potassium: 3.8 mmol/L (ref 3.5–5.1)
Sodium: 136 mmol/L (ref 135–145)

## 2024-09-19 MED ORDER — STERILE WATER FOR INJECTION IJ SOLN
INTRAMUSCULAR | Status: AC
  Administered 2024-09-19: 10 mL
  Filled 2024-09-19: qty 10

## 2024-09-19 NOTE — Plan of Care (Signed)

## 2024-09-19 NOTE — Progress Notes (Addendum)
 PROGRESS NOTE    [name]  FMW:[id number] DOB: 1961/03/23 DOA: 09/16/2024 PCP: Osa Geralds, NP    Assessment & Plan:   Principal Problem:   Acute exacerbation of CHF (congestive heart failure) (HCC) Active Problems:   Coronary artery disease involving coronary bypass graft of native heart without angina pectoris   Iron  deficiency anemia due to chronic blood loss   CAD (coronary artery disease)   Essential hypertension   Elevated troponin   History of stroke s/p left carotid endarterectomy   AKI (acute kidney injury)   GERD without esophagitis   Dyslipidemia   Depression   Abnormality of abdominal aorta   Dissection of infrarenal aorta (HCC)  Assessment and Plan: Acute exacerbation diastolic CHF: echo on 05/10/2024 w/ EF 60 to 65%, grade 1 diastolic dysfunction. Significant b/l LE pitting edema. Continue on IV lasix . Monitor I/Os  Chronic hypoxic respiratory failure: continue on home supplemental oxygen, uses 2-3L [place] at home   Hypokalemia: WNL today   Hx of CAD: w/ ventricular ectopy. Continue on home dose of amio    IDA: s/p 1 unit of pRBCs transfused so far. Will transfuse if Hb < 7.0  Infrarenal abd aorta dissection: ABI shows mild-mod PAD. Will need to outpatient w/ vasc surg for likely intervention as per vasc surg. Vasc surg following and recs apprec    Elevated troponin: likely secondary to demand ischemia. Trending down   HTN: continue on metoprolol , imdur     HLD: continue on statin    AKI: Cr is trending down today. Will continue to monitor while on lasix   Peripheral neuropathy: d/c home gabapentin  on 10/1 b/c it did not help as per pt and started and will continue on pregabalin  Obesity: BMI 31.3. Would benefit from weight loss        DVT prophylaxis: SCDs Code Status: full Family Communication: discussed pt's care w/ pt's friend, Bari, and answered her questions  Disposition Plan: likely d/c home w/ HH  Level of care: Telemetry  Cardiac Consultants:    Procedures:   Antimicrobials:    Subjective: Pt c/o malaise  Objective: Vitals:   09/19/24 0402 09/19/24 0743 09/19/24 1201 09/19/24 1207  BP: 109/67 (!) 106/56  108/63  Pulse: 90 88  86  Resp: 18 18  19   Temp: 98.1 F (36.7 C) 97.8 F (36.6 C) (P) 98.1 F (36.7 C) 97.9 F (36.6 C)  TempSrc: Oral  (P) Axillary   SpO2: 96% 99%  98%  Weight:      Height:        Intake/Output Summary (Last 24 hours) at 09/19/2024 1605 Last data filed at 09/19/2024 1351 Gross per 24 hour  Intake 3597 ml  Output 3400 ml  Net 197 ml   Filed Weights   09/16/24 0926  Weight: 104.8 kg    Examination:  General exam: appears calm & comfortable Respiratory system: diminished breath sounds b/l  Cardiovascular system: S1 & S2+. 2+ pitting edema of b/l LE Gastrointestinal system: abd is soft, NT, obese & hypoactive bowel sounds Central nervous system: alert & awake. Moves all extremities  Psychiatry: judgement and insight appears at baseline.flat mood and affect    Data Reviewed: I have personally reviewed following labs and imaging studies  CBC: Recent Labs  Lab 09/16/24 0929 09/17/24 0514 09/18/24 0357 09/19/24 0436  WBC 9.0 12.1* 9.8 9.9  NEUTROABS 6.5  --   --   --   HGB 7.6* 8.1* 7.6* 7.3*  HCT 26.1* 27.1* 26.1* 24.7*  MCV 94.2 91.9 93.9 93.6  PLT 354 365 365 360   Basic Metabolic Panel: Recent Labs  Lab 09/16/24 0929 09/17/24 0514 09/18/24 0357 09/19/24 0436  NA 136 137 136 136  K 3.5 3.3* 3.7 3.8  CL 89* 89* 91* 91*  CO2 37* 35* 35* 36*  GLUCOSE 103* 122* 153* 136*  BUN 17 20 22 21   CREATININE 1.41* 1.43* 1.67* 1.56*  CALCIUM  8.4* 8.0* 8.1* 8.2*   GFR: Estimated Creatinine Clearance: 59.1 mL/min (A) (by C-G formula based on SCr of 1.56 mg/dL (H)). Liver Function Tests: Recent Labs  Lab 09/16/24 0929  AST 16  ALT 7  ALKPHOS 60  BILITOT 1.2  PROT 7.7  ALBUMIN  3.1*   No results for input(s): LIPASE, AMYLASE in the last 168  hours. No results for input(s): AMMONIA in the last 168 hours. Coagulation Profile: Recent Labs  Lab 09/16/24 0929  INR 1.2   Cardiac Enzymes: No results for input(s): CKTOTAL, CKMB, CKMBINDEX, TROPONINI in the last 168 hours. BNP (last 3 results) No results for input(s): PROBNP in the last 8760 hours. HbA1C: No results for input(s): HGBA1C in the last 72 hours. CBG: No results for input(s): GLUCAP in the last 168 hours. Lipid Profile: No results for input(s): CHOL, HDL, LDLCALC, TRIG, CHOLHDL, LDLDIRECT in the last 72 hours. Thyroid Function Tests: No results for input(s): TSH, T4TOTAL, FREET4, T3FREE, THYROIDAB in the last 72 hours. Anemia Panel: No results for input(s): VITAMINB12, FOLATE, FERRITIN, TIBC, IRON , RETICCTPCT in the last 72 hours. Sepsis Labs: No results for input(s): PROCALCITON, LATICACIDVEN in the last 168 hours.  No results found for this or any previous visit (from the past 240 hours).       Radiology Studies: No results found.       Scheduled Meds:  sodium chloride   250 mL Intravenous Once   amiodarone   200 mg Oral Daily   ascorbic acid   500 mg Oral Daily   budesonide -glycopyrrolate -formoterol   2 puff Inhalation BID   dapagliflozin  propanediol  10 mg Oral Daily   Fe Fum-Vit C-Vit B12-FA  1 capsule Oral BID   feeding supplement  237 mL Oral BID BM   folic acid   1 mg Oral Daily   furosemide   40 mg Intravenous Daily   isosorbide  mononitrate  30 mg Oral Daily   metoprolol  succinate  25 mg Oral Daily   multivitamin with minerals  1 tablet Oral Daily   nicotine   14 mg Transdermal Daily   pantoprazole  (PROTONIX ) IV  40 mg Intravenous BID   polyethylene glycol  17 g Oral Q breakfast   pregabalin  50 mg Oral TID   rosuvastatin   20 mg Oral QHS   thiamine   100 mg Oral Daily   Continuous Infusions:   LOS: 3 days      Anthony CHRISTELLA Pouch, MD [facility] Pager [phone]  If  7PM-7AM, please contact night-coverage [email or web address] 09/19/2024, 4:05 PM

## 2024-09-19 NOTE — Progress Notes (Signed)
 Initial Nutrition Assessment  DOCUMENTATION CODES:   Obesity unspecified  INTERVENTION:   -Continue 2 gram sodium diet -Continue Ensure Plus High Protein po BID, each supplement provides 350 kcal and 20 grams of protein  -MVI with minerals daily  NUTRITION DIAGNOSIS:   Increased nutrient needs related to chronic illness (CHF) as evidenced by estimated needs.  GOAL:   Patient will meet greater than or equal to 90% of their needs  MONITOR:   PO intake, Supplement acceptance  REASON FOR ASSESSMENT:   Malnutrition Screening Tool    ASSESSMENT:   Pt with history of heart failure preserved ejection fraction, morbid obesity, hypertension, neuropathy, insomnia, hyperlipidemia, coronary artery disease, GERD, chronic oxygen requirement on 2 L nasal cannula, who presents for chief concerns of bilateral lower extremity swelling, dizziness, abdominal pain.  Pt admitted with CHF.   Reviewed I/O's: +170 ml x 24 hours and -2.8 L since admission  UOP: 2.4 L x 24 hours  Pt unavailable at time of visit. Attempted to speak with pt via call to hospital room phone, however, unable to reach. RD unable to obtain further nutrition-related history or complete nutrition-focused physical exam at this time.    Pt familiar to this RD due to multiple prior admissions.   Pt on a 2 gram sodium diet. Noted meal completions 100%. Pt also drinking Ensure supplements.   No wt loss noted over the past 3 months. Per nursing assessment, pt with moderate edema, which may be masking true weight loss as well as fat and muscle depletions.   Per TOC notes, plan home with home health services.   Medications reviewed and include vitamin C , amiodarone , folic acid , lasix , MVI, protonix , miralax , and thiamine .   Labs reviewed.    Diet Order:   Diet Order             Diet 2 gram sodium Fluid consistency: Thin  Diet effective now                   EDUCATION NEEDS:   No education needs have been  identified at this time  Skin:  Skin Assessment: Reviewed RN Assessment  Last BM:  Unknown  Height:   Ht Readings from Last 1 Encounters:  09/16/24 6' (1.829 m)    Weight:   Wt Readings from Last 1 Encounters:  09/16/24 104.8 kg    Ideal Body Weight:  80.9 kg  BMI:  Body mass index is 31.33 kg/m.  Estimated Nutritional Needs:   Kcal:  2000-2200  Protein:  100-115 grams  Fluid:  2.0-2.2 L    Margery ORN, RD, LDN, CDCES Registered Dietitian III Certified Diabetes Care and Education Specialist If unable to reach this RD, please use RD Inpatient group chat on secure chat between hours of 8am-4 pm daily

## 2024-09-19 NOTE — Progress Notes (Signed)
 Physical Therapy Treatment Patient Details Name: [name] MRN: [id number] DOB: 05-11-1961 Today's Date: 09/19/2024   History of Present Illness Patient is a 63 year old male with bilateral LE swelling, dizziness, abdominal pain, acute exacerbation of diastolic CHF. PMH: heart failure preserved ejection fraction, morbid obesity, hypertension, neuropathy, insomnia, hyperlipidemia, coronary artery disease, GERD, chronic oxygen requirement on 2 L nasal cannula    PT Comments  Patient seen for PT session focused on improving aerobic exercise tolerance. Patient required CGA and supplemental 3L/min. O2 for hallway ambulation of 100 feet and used RW. Tolerated session well although limited by shortness of breath. Pt fatigue after bout of walking. Pt needed ~3 min. For RR to return to baseline. Main limiting factors today were fatigue and need for prolonged recovery after hallway ambulation. Interventions aimed at improving aerobic endurance and functional exercise tolerance. Continued skilled PT recommended to progress toward functional goals and support discharge readiness.     If plan is discharge home, recommend the following: Assist for transportation;Assistance with cooking/housework   Can travel by private vehicle        Equipment Recommendations  None recommended by PT    Recommendations for Other Services       Precautions / Restrictions Precautions Precautions: Fall Recall of Precautions/Restrictions: Intact Precaution/Restrictions Comments: limited ability for ambulation requires spO2; COPD Restrictions Weight Bearing Restrictions Per Provider Order: No     Mobility  Bed Mobility Overal bed mobility: Modified Independent Bed Mobility: Sit to Supine       Sit to supine: Supervision        Transfers Overall transfer level: Needs assistance Equipment used: 1 person hand held assist Transfers: Sit to/from Stand Sit to Stand: Contact guard assist                 Ambulation/Gait Ambulation/Gait assistance: Contact guard assist Gait Distance (Feet): 100 Feet Assistive device: Rolling walker (2 wheels) Gait Pattern/deviations: Step-through pattern, Decreased stride length Gait velocity: decreased     General Gait Details: Sp02 97% on 3 L02 at baseline; shortness of breath with wheezing after mobility spO2 99%   Stairs             Wheelchair Mobility     Tilt Bed    Modified Rankin (Stroke Patients Only)       Balance Overall balance assessment: Needs assistance Sitting-balance support: Feet supported Sitting balance-Leahy Scale: Fair     Standing balance support: Single extremity supported Standing balance-Leahy Scale: Fair                              Hotel manager: Impaired Factors Affecting Communication: Hearing impaired  Cognition Arousal: Alert Behavior During Therapy: WFL for tasks assessed/performed                             Following commands: Intact      Cueing Cueing Techniques: Verbal cues  Exercises      General Comments        Pertinent Vitals/Pain Pain Assessment Pain Assessment: No/denies pain Breathing: normal    Home Living Family/patient expects to be discharged to:: Private residence Living Arrangements: Alone Available Help at Discharge: Friend(s);Available PRN/intermittently Type of Home: House Home Access: Level entry       Home Layout: Two level;Able to live on main level with bedroom/bathroom Home Equipment: Rolling Walker (2 wheels);BSC/3in1;Shower seat Additional Comments: 2-3  L at home per patient report. He reports getting home health PT    Prior Function            PT Goals (current goals can now be found in the care plan section) Acute Rehab PT Goals Patient Stated Goal: to go home PT Goal Formulation: With patient Time For Goal Achievement: 10/02/24 Potential to Achieve Goals: Good    Frequency     Min 2X/week      PT Plan      Co-evaluation              AM-PAC PT 6 Clicks Mobility   Outcome Measure  Help needed turning from your back to your side while in a flat bed without using bedrails?: None Help needed moving from lying on your back to sitting on the side of a flat bed without using bedrails?: A Little Help needed moving to and from a bed to a chair (including a wheelchair)?: A Little Help needed standing up from a chair using your arms (e.g., wheelchair or bedside chair)?: A Little Help needed to walk in hospital room?: A Little Help needed climbing 3-5 steps with a railing? : A Little 6 Click Score: 19    End of Session Equipment Utilized During Treatment: Gait belt Activity Tolerance: Patient tolerated treatment well;Patient limited by fatigue Patient left: in bed (sittng at EOB eating) Nurse Communication: Mobility status PT Visit Diagnosis: Unsteadiness on feet (R26.81);Muscle weakness (generalized) (M62.81)     Time: 8695-8680 PT Time Calculation (min) (ACUTE ONLY): 15 min  Charges:    $Therapeutic Activity: 8-22 mins PT General Charges $$ ACUTE PT VISIT: 1 Visit                     Douglas Lesches DPT, PT     [name] 09/19/2024, 2:14 PM

## 2024-09-20 DIAGNOSIS — I5033 Acute on chronic diastolic (congestive) heart failure: Secondary | ICD-10-CM | POA: Diagnosis not present

## 2024-09-20 LAB — CBC
HCT: 24.4 % — ABNORMAL LOW (ref 39.0–52.0)
Hemoglobin: 7.3 g/dL — ABNORMAL LOW (ref 13.0–17.0)
MCH: 27.5 pg (ref 26.0–34.0)
MCHC: 29.9 g/dL — ABNORMAL LOW (ref 30.0–36.0)
MCV: 92.1 fL (ref 80.0–100.0)
Platelets: 363 K/uL (ref 150–400)
RBC: 2.65 MIL/uL — ABNORMAL LOW (ref 4.22–5.81)
RDW: 16.9 % — ABNORMAL HIGH (ref 11.5–15.5)
WBC: 10.9 K/uL — ABNORMAL HIGH (ref 4.0–10.5)
nRBC: 0 % (ref 0.0–0.2)

## 2024-09-20 LAB — BASIC METABOLIC PANEL WITH GFR
Anion gap: 11 (ref 5–15)
BUN: 23 mg/dL (ref 8–23)
CO2: 35 mmol/L — ABNORMAL HIGH (ref 22–32)
Calcium: 8.2 mg/dL — ABNORMAL LOW (ref 8.9–10.3)
Chloride: 90 mmol/L — ABNORMAL LOW (ref 98–111)
Creatinine, Ser: 1.33 mg/dL — ABNORMAL HIGH (ref 0.61–1.24)
GFR, Estimated: 59 mL/min — ABNORMAL LOW (ref >60)
Glucose, Bld: 108 mg/dL — ABNORMAL HIGH (ref 70–99)
Potassium: 3.9 mmol/L (ref 3.5–5.1)
Sodium: 136 mmol/L (ref 135–145)

## 2024-09-20 NOTE — Progress Notes (Signed)
 PROGRESS NOTE    [name]  FMW:[id number] DOB: 10-Dec-1961 DOA: 09/16/2024 PCP: Osa Geralds, NP    Assessment & Plan:   Principal Problem:   Acute exacerbation of CHF (congestive heart failure) (HCC) Active Problems:   Coronary artery disease involving coronary bypass graft of native heart without angina pectoris   Iron  deficiency anemia due to chronic blood loss   CAD (coronary artery disease)   Essential hypertension   Elevated troponin   History of stroke s/p left carotid endarterectomy   AKI (acute kidney injury)   GERD without esophagitis   Dyslipidemia   Depression   Abnormality of abdominal aorta   Dissection of infrarenal aorta (HCC)  Assessment and Plan: Acute exacerbation diastolic CHF: echo on 05/10/2024 w/ EF 60 to 65%, grade 1 diastolic dysfunction. Significant b/l LE pitting edema. Continue on IV lasix . Monitor I/Os  Chronic hypoxic respiratory failure: continue on supplemental oxygen, uses 2-3L [place] at home   Hypokalemia: WNL today   Hx of CAD: w/ ventricular ectopy. Continue on home dose of amio    IDA: s/p 1 unit of pRBCs transfused so far. Will monitor stools for blood. Will transfuse if Hb < 7.0   Infrarenal abd aorta dissection: ABI shows mild-mod PAD. Will need to outpatient w/ vasc surg for likely intervention as per vasc surg. Vasc surg following and recs apprec    Elevated troponin: likely secondary to demand ischemia. Trending down   HTN: continue on imdur , metoprolol     HLD: continue on statin    AKI: Cr is trending down again today.  Will continue to monitor while on lasix   Peripheral neuropathy: d/c home gabapentin  on 10/1 b/c it did not help as per pt and started and continue on pregablin   Obesity: BMI 31.3. Would benefit from weight loss        DVT prophylaxis: SCDs Code Status: full Family Communication:  Disposition Plan: likely d/c home w/ HH  Level of care: Telemetry Cardiac Consultants:    Procedures:    Antimicrobials:    Subjective: Pt c/o fatigue   Objective: Vitals:   09/19/24 2055 09/19/24 2353 09/20/24 0429 09/20/24 0824  BP: 109/63 112/67 (!) 144/88 98/61  Pulse: 84 81 92 93  Resp: 18 18 18 18   Temp: 98.2 F (36.8 C) 97.9 F (36.6 C) 97.7 F (36.5 C) 98.2 F (36.8 C)  TempSrc:    Oral  SpO2: 97% 96% 100% 96%  Weight:      Height:        Intake/Output Summary (Last 24 hours) at 09/20/2024 0927 Last data filed at 09/20/2024 0800 Gross per 24 hour  Intake 1437 ml  Output 2875 ml  Net -1438 ml   Filed Weights   09/16/24 0926  Weight: 104.8 kg    Examination:  General exam: appears comfortable Respiratory system: decreased breath sounds b/l Cardiovascular system: S1/S2+. 2+ pitting edema b/l LE Gastrointestinal system: abd is soft, NT, obese & hypoactive bowel sounds Central nervous system: alert & awake. Moves all extremities  Psychiatry: judgement and insight appears at baseline. Flat mood and affect   Data Reviewed: I have personally reviewed following labs and imaging studies  CBC: Recent Labs  Lab 09/16/24 0929 09/17/24 0514 09/18/24 0357 09/19/24 0436 09/20/24 0417  WBC 9.0 12.1* 9.8 9.9 10.9*  NEUTROABS 6.5  --   --   --   --   HGB 7.6* 8.1* 7.6* 7.3* 7.3*  HCT 26.1* 27.1* 26.1* 24.7* 24.4*  MCV 94.2 91.9  93.9 93.6 92.1  PLT 354 365 365 360 363   Basic Metabolic Panel: Recent Labs  Lab 09/16/24 0929 09/17/24 0514 09/18/24 0357 09/19/24 0436 09/20/24 0417  NA 136 137 136 136 136  K 3.5 3.3* 3.7 3.8 3.9  CL 89* 89* 91* 91* 90*  CO2 37* 35* 35* 36* 35*  GLUCOSE 103* 122* 153* 136* 108*  BUN 17 20 22 21 23   CREATININE 1.41* 1.43* 1.67* 1.56* 1.33*  CALCIUM  8.4* 8.0* 8.1* 8.2* 8.2*   GFR: Estimated Creatinine Clearance: 69.3 mL/min (A) (by C-G formula based on SCr of 1.33 mg/dL (H)). Liver Function Tests: Recent Labs  Lab 09/16/24 0929  AST 16  ALT 7  ALKPHOS 60  BILITOT 1.2  PROT 7.7  ALBUMIN  3.1*   No results for  input(s): LIPASE, AMYLASE in the last 168 hours. No results for input(s): AMMONIA in the last 168 hours. Coagulation Profile: Recent Labs  Lab 09/16/24 0929  INR 1.2   Cardiac Enzymes: No results for input(s): CKTOTAL, CKMB, CKMBINDEX, TROPONINI in the last 168 hours. BNP (last 3 results) No results for input(s): PROBNP in the last 8760 hours. HbA1C: No results for input(s): HGBA1C in the last 72 hours. CBG: No results for input(s): GLUCAP in the last 168 hours. Lipid Profile: No results for input(s): CHOL, HDL, LDLCALC, TRIG, CHOLHDL, LDLDIRECT in the last 72 hours. Thyroid Function Tests: No results for input(s): TSH, T4TOTAL, FREET4, T3FREE, THYROIDAB in the last 72 hours. Anemia Panel: No results for input(s): VITAMINB12, FOLATE, FERRITIN, TIBC, IRON , RETICCTPCT in the last 72 hours. Sepsis Labs: No results for input(s): PROCALCITON, LATICACIDVEN in the last 168 hours.  No results found for this or any previous visit (from the past 240 hours).       Radiology Studies: No results found.       Scheduled Meds:  sodium chloride   250 mL Intravenous Once   amiodarone   200 mg Oral Daily   ascorbic acid   500 mg Oral Daily   budesonide -glycopyrrolate -formoterol   2 puff Inhalation BID   dapagliflozin  propanediol  10 mg Oral Daily   Fe Fum-Vit C-Vit B12-FA  1 capsule Oral BID   feeding supplement  237 mL Oral BID BM   folic acid   1 mg Oral Daily   furosemide   40 mg Intravenous Daily   isosorbide  mononitrate  30 mg Oral Daily   metoprolol  succinate  25 mg Oral Daily   multivitamin with minerals  1 tablet Oral Daily   nicotine   14 mg Transdermal Daily   pantoprazole  (PROTONIX ) IV  40 mg Intravenous BID   polyethylene glycol  17 g Oral Q breakfast   pregabalin  50 mg Oral TID   rosuvastatin   20 mg Oral QHS   thiamine   100 mg Oral Daily   Continuous Infusions:   LOS: 4 days      Anthony CHRISTELLA Pouch,  MD [facility] Pager [phone]  If 7PM-7AM, please contact night-coverage [email or web address] 09/20/2024, 9:27 AM

## 2024-09-21 ENCOUNTER — Inpatient Hospital Stay

## 2024-09-21 DIAGNOSIS — I5033 Acute on chronic diastolic (congestive) heart failure: Secondary | ICD-10-CM | POA: Diagnosis not present

## 2024-09-21 LAB — BASIC METABOLIC PANEL WITH GFR
Anion gap: 12 (ref 5–15)
BUN: 23 mg/dL (ref 8–23)
CO2: 34 mmol/L — ABNORMAL HIGH (ref 22–32)
Calcium: 8.5 mg/dL — ABNORMAL LOW (ref 8.9–10.3)
Chloride: 91 mmol/L — ABNORMAL LOW (ref 98–111)
Creatinine, Ser: 1.34 mg/dL — ABNORMAL HIGH (ref 0.61–1.24)
GFR, Estimated: 59 mL/min — ABNORMAL LOW (ref >60)
Glucose, Bld: 120 mg/dL — ABNORMAL HIGH (ref 70–99)
Potassium: 4 mmol/L (ref 3.5–5.1)
Sodium: 137 mmol/L (ref 135–145)

## 2024-09-21 LAB — CBC
HCT: 26.4 % — ABNORMAL LOW (ref 39.0–52.0)
Hemoglobin: 7.9 g/dL — ABNORMAL LOW (ref 13.0–17.0)
MCH: 27.8 pg (ref 26.0–34.0)
MCHC: 29.9 g/dL — ABNORMAL LOW (ref 30.0–36.0)
MCV: 93 fL (ref 80.0–100.0)
Platelets: 446 K/uL — ABNORMAL HIGH (ref 150–400)
RBC: 2.84 MIL/uL — ABNORMAL LOW (ref 4.22–5.81)
RDW: 17.1 % — ABNORMAL HIGH (ref 11.5–15.5)
WBC: 12.5 K/uL — ABNORMAL HIGH (ref 4.0–10.5)
nRBC: 0 % (ref 0.0–0.2)

## 2024-09-21 MED ORDER — FUROSEMIDE 10 MG/ML IJ SOLN
40.0000 mg | Freq: Two times a day (BID) | INTRAMUSCULAR | Status: DC
  Administered 2024-09-21 – 2024-09-22 (×2): 40 mg via INTRAVENOUS
  Filled 2024-09-21 (×2): qty 4

## 2024-09-21 MED ORDER — ACETAMINOPHEN 650 MG RE SUPP: 650.0000 mg | Freq: Four times a day (QID) | RECTAL | Status: DC | PRN

## 2024-09-21 MED ORDER — ACETAMINOPHEN 325 MG PO TABS
650.0000 mg | ORAL_TABLET | Freq: Four times a day (QID) | ORAL | Status: DC | PRN
  Administered 2024-09-21 – 2024-09-24 (×4): 650 mg via ORAL
  Filled 2024-09-21 (×4): qty 2

## 2024-09-21 MED ORDER — PANTOPRAZOLE SODIUM 40 MG PO TBEC
40.0000 mg | DELAYED_RELEASE_TABLET | Freq: Two times a day (BID) | ORAL | Status: DC
  Administered 2024-09-21 – 2024-09-24 (×7): 40 mg via ORAL
  Filled 2024-09-21 (×7): qty 1

## 2024-09-21 NOTE — Progress Notes (Addendum)
 PROGRESS NOTE    [name]  FMW:[id number] DOB: 1961/05/22 DOA: 09/16/2024 PCP: Osa Geralds, NP    Assessment & Plan:   Principal Problem:   Acute exacerbation of CHF (congestive heart failure) (HCC) Active Problems:   Coronary artery disease involving coronary bypass graft of native heart without angina pectoris   Iron  deficiency anemia due to chronic blood loss   CAD (coronary artery disease)   Essential hypertension   Elevated troponin   History of stroke s/p left carotid endarterectomy   AKI (acute kidney injury)   GERD without esophagitis   Dyslipidemia   Depression   Abnormality of abdominal aorta   Dissection of infrarenal aorta (HCC)  Assessment and Plan: Acute exacerbation diastolic CHF: echo on 05/10/2024 w/ EF 60 to 65%, grade 1 diastolic dysfunction. Still w/ significant b/l LE pitting edema. Repeat CXR shows vascular congestion & edema, similar to prior CXR. Increased IV lasix  BID. Monitor I/Os. Neg approx 2.4L today. Compression stockings ordered   Chronic hypoxic respiratory failure: continue on supplemental oxygen, uses 2-3L [place] at home   Hypokalemia: WNL today   Hx of CAD: w/ ventricular ectopy. Continue on home dose of amio     IDA: s/p 1 unit of pRBCs transfused so far. Will monitor stools for blood. Will transfuse if Hb < 7.0   Infrarenal abd aorta dissection: ABI shows mild-mod PAD. Will need to outpatient w/ vasc surg for likely intervention as per vasc surg. Vasc surg following and recs apprec    Elevated troponin: likely secondary to demand ischemia. Trending down   HTN: continue on metoprolol , imdur      HLD: continue on statin     AKI: Cr is labile. Avoid nephrotoxic meds   Peripheral neuropathy: d/c home gabapentin  on 10/1 b/c it did not help as per pt and started and will continue on pregabalin   Obesity: BMI 31.3. Would benefit from weight loss        DVT prophylaxis: SCDs Code Status: full Family Communication:   Disposition Plan: likely d/c home w/ HH  Level of care: Telemetry Cardiac Consultants:    Procedures:   Antimicrobials:    Subjective: Pt c/o shortness of breath   Objective: Vitals:   09/20/24 2050 09/20/24 2332 09/21/24 0410 09/21/24 0831  BP: 107/69 (!) 102/58 116/68 111/70  Pulse: 92 85 83 92  Resp: 18 18 18 20   Temp: 97.8 F (36.6 C) 98.1 F (36.7 C) 98 F (36.7 C) 98.3 F (36.8 C)  TempSrc: Oral Oral Oral   SpO2: 97% 95% 99% 95%  Weight:      Height:        Intake/Output Summary (Last 24 hours) at 09/21/2024 0924 Last data filed at 09/21/2024 0700 Gross per 24 hour  Intake 680 ml  Output 3150 ml  Net -2470 ml   Filed Weights   09/16/24 0926  Weight: 104.8 kg    Examination:  General exam: appears calm but uncomfortable  Respiratory system: diminished breath sounds b/l  Cardiovascular system: S1 & S2+ Gastrointestinal system: abd is soft, NT, obese, normal bowel sounds Central nervous system: alert & awake. Moves all extremities   Psychiatry: judgement and insight appears at baseline. Flat mood and affect    Data Reviewed: I have personally reviewed following labs and imaging studies  CBC: Recent Labs  Lab 09/16/24 0929 09/17/24 0514 09/18/24 0357 09/19/24 0436 09/20/24 0417 09/21/24 0430  WBC 9.0 12.1* 9.8 9.9 10.9* 12.5*  NEUTROABS 6.5  --   --   --   --   --  HGB 7.6* 8.1* 7.6* 7.3* 7.3* 7.9*  HCT 26.1* 27.1* 26.1* 24.7* 24.4* 26.4*  MCV 94.2 91.9 93.9 93.6 92.1 93.0  PLT 354 365 365 360 363 446*   Basic Metabolic Panel: Recent Labs  Lab 09/17/24 0514 09/18/24 0357 09/19/24 0436 09/20/24 0417 09/21/24 0430  NA 137 136 136 136 137  K 3.3* 3.7 3.8 3.9 4.0  CL 89* 91* 91* 90* 91*  CO2 35* 35* 36* 35* 34*  GLUCOSE 122* 153* 136* 108* 120*  BUN 20 22 21 23 23   CREATININE 1.43* 1.67* 1.56* 1.33* 1.34*  CALCIUM  8.0* 8.1* 8.2* 8.2* 8.5*   GFR: Estimated Creatinine Clearance: 68.8 mL/min (A) (by C-G formula based on SCr of 1.34  mg/dL (H)). Liver Function Tests: Recent Labs  Lab 09/16/24 0929  AST 16  ALT 7  ALKPHOS 60  BILITOT 1.2  PROT 7.7  ALBUMIN  3.1*   No results for input(s): LIPASE, AMYLASE in the last 168 hours. No results for input(s): AMMONIA in the last 168 hours. Coagulation Profile: Recent Labs  Lab 09/16/24 0929  INR 1.2   Cardiac Enzymes: No results for input(s): CKTOTAL, CKMB, CKMBINDEX, TROPONINI in the last 168 hours. BNP (last 3 results) No results for input(s): PROBNP in the last 8760 hours. HbA1C: No results for input(s): HGBA1C in the last 72 hours. CBG: No results for input(s): GLUCAP in the last 168 hours. Lipid Profile: No results for input(s): CHOL, HDL, LDLCALC, TRIG, CHOLHDL, LDLDIRECT in the last 72 hours. Thyroid Function Tests: No results for input(s): TSH, T4TOTAL, FREET4, T3FREE, THYROIDAB in the last 72 hours. Anemia Panel: No results for input(s): VITAMINB12, FOLATE, FERRITIN, TIBC, IRON , RETICCTPCT in the last 72 hours. Sepsis Labs: No results for input(s): PROCALCITON, LATICACIDVEN in the last 168 hours.  No results found for this or any previous visit (from the past 240 hours).       Radiology Studies: No results found.       Scheduled Meds:  sodium chloride   250 mL Intravenous Once   amiodarone   200 mg Oral Daily   ascorbic acid   500 mg Oral Daily   budesonide -glycopyrrolate -formoterol   2 puff Inhalation BID   dapagliflozin  propanediol  10 mg Oral Daily   Fe Fum-Vit C-Vit B12-FA  1 capsule Oral BID   feeding supplement  237 mL Oral BID BM   folic acid   1 mg Oral Daily   furosemide   40 mg Intravenous Daily   isosorbide  mononitrate  30 mg Oral Daily   metoprolol  succinate  25 mg Oral Daily   multivitamin with minerals  1 tablet Oral Daily   nicotine   14 mg Transdermal Daily   pantoprazole   40 mg Oral BID   polyethylene glycol  17 g Oral Q breakfast   pregabalin  50 mg Oral TID    rosuvastatin   20 mg Oral QHS   thiamine   100 mg Oral Daily   Continuous Infusions:   LOS: 5 days      Anthony CHRISTELLA Pouch, MD [facility] Pager [phone]  If 7PM-7AM, please contact night-coverage [email or web address] 09/21/2024, 9:24 AM

## 2024-09-21 NOTE — Plan of Care (Signed)

## 2024-09-22 ENCOUNTER — Other Ambulatory Visit: Payer: Self-pay

## 2024-09-22 DIAGNOSIS — I5033 Acute on chronic diastolic (congestive) heart failure: Secondary | ICD-10-CM | POA: Diagnosis not present

## 2024-09-22 LAB — BASIC METABOLIC PANEL WITH GFR
Anion gap: 18 — ABNORMAL HIGH (ref 5–15)
BUN: 29 mg/dL — ABNORMAL HIGH (ref 8–23)
CO2: 32 mmol/L (ref 22–32)
Calcium: 8.8 mg/dL — ABNORMAL LOW (ref 8.9–10.3)
Chloride: 88 mmol/L — ABNORMAL LOW (ref 98–111)
Creatinine, Ser: 1.76 mg/dL — ABNORMAL HIGH (ref 0.61–1.24)
GFR, Estimated: 42 mL/min — ABNORMAL LOW (ref >60)
Glucose, Bld: 151 mg/dL — ABNORMAL HIGH (ref 70–99)
Potassium: 4 mmol/L (ref 3.5–5.1)
Sodium: 138 mmol/L (ref 135–145)

## 2024-09-22 LAB — CBC
HCT: 23.8 % — ABNORMAL LOW (ref 39.0–52.0)
Hemoglobin: 7.2 g/dL — ABNORMAL LOW (ref 13.0–17.0)
MCH: 28 pg (ref 26.0–34.0)
MCHC: 30.3 g/dL (ref 30.0–36.0)
MCV: 92.6 fL (ref 80.0–100.0)
Platelets: 399 K/uL (ref 150–400)
RBC: 2.57 MIL/uL — ABNORMAL LOW (ref 4.22–5.81)
RDW: 17.2 % — ABNORMAL HIGH (ref 11.5–15.5)
WBC: 10.9 K/uL — ABNORMAL HIGH (ref 4.0–10.5)
nRBC: 0 % (ref 0.0–0.2)

## 2024-09-22 MED ORDER — ROSUVASTATIN CALCIUM 20 MG PO TABS
20.0000 mg | ORAL_TABLET | Freq: Every day | ORAL | 0 refills | Status: AC
  Filled 2024-09-22: qty 30, 30d supply, fill #0

## 2024-09-22 MED ORDER — FUROSEMIDE 10 MG/ML IJ SOLN
40.0000 mg | Freq: Every day | INTRAMUSCULAR | Status: DC
  Administered 2024-09-23: 40 mg via INTRAVENOUS
  Filled 2024-09-22: qty 4

## 2024-09-22 MED ORDER — DAPAGLIFLOZIN PROPANEDIOL 10 MG PO TABS
10.0000 mg | ORAL_TABLET | Freq: Every day | ORAL | 0 refills | Status: AC
  Filled 2024-09-22: qty 30, 30d supply, fill #0

## 2024-09-22 NOTE — Progress Notes (Signed)
 Occupational Therapy Treatment Patient Details Name: [name] MRN: [id number] DOB: 12-Mar-1961 Today's Date: 09/22/2024   History of present illness Patient is a 63 year old male with bilateral LE swelling, dizziness, abdominal pain, acute exacerbation of diastolic CHF. PMH: heart failure preserved ejection fraction, morbid obesity, hypertension, neuropathy, insomnia, hyperlipidemia, coronary artery disease, GERD, chronic oxygen requirement on 2 L nasal cannula   OT comments  Pt is seated in recliner on arrival. Pleasant and agreeable to OT session. He denies pain. Pt declined ambulation bouts, but is agreeable to extensive education on ECS, pacing, AE/AD/DME use and task and home modification for ADL performance to maximize his safety and IND upon return home. He verbalized understanding and is aware of the importance of activity and exercise to prevent further decline. He performed seated grooming tasks in recliner with set up assist and reports mild SOB with this. Edu on seated BLE/BUE strengthening exercises to maintain his strength. He was left in recliner with all needs in place and will cont to require skilled acute OT services to maximize his safety and IND to return to PLOF.       If plan is discharge home, recommend the following:  A little help with walking and/or transfers;A little help with bathing/dressing/bathroom;Assistance with cooking/housework;Assist for transportation   Equipment Recommendations  BSC/3in1    Recommendations for Other Services      Precautions / Restrictions Precautions Precautions: Fall Recall of Precautions/Restrictions: Intact Precaution/Restrictions Comments: limited ability for ambulation requires spO2; COPD Restrictions Weight Bearing Restrictions Per Provider Order: No       Mobility Bed Mobility               General bed mobility comments: NT up in recliner pre/post session    Transfers                   General  transfer comment: declined after just getting up to recliner with nursing; agreeable to education and seated grooming tasks;edu on importance of activity     Balance Overall balance assessment: Needs assistance Sitting-balance support: Feet supported Sitting balance-Leahy Scale: Good                                     ADL either performed or assessed with clinical judgement   ADL Overall ADL's : Needs assistance/impaired     Grooming: Set up;Wash/dry face;Oral care;Sitting Grooming Details (indicate cue type and reason): recliner level                               General ADL Comments: provided edu on ECS, pacing, AE/AD/DME use and task and home modification for ADL performance to maximize his safety and IND upon return home.    Extremity/Trunk Assessment              Vision       Haematologist Communication Communication: Impaired Factors Affecting Communication: Hearing impaired   Cognition Arousal: Alert Behavior During Therapy: WFL for tasks assessed/performed                                 Following commands: Intact        Cueing   Cueing Techniques: Verbal cues  Exercises Other Exercises Other  Exercises: provided edu on ECS, pacing, AE/AD/DME use and task and home modification for ADL performance to maximize his safety and IND upon return home.    Shoulder Instructions       General Comments      Pertinent Vitals/ Pain       Pain Assessment Pain Assessment: No/denies pain  Home Living                                          Prior Functioning/Environment              Frequency  Min 2X/week        Progress Toward Goals  OT Goals(current goals can now be found in the care plan section)  Progress towards OT goals: Progressing toward goals  Acute Rehab OT Goals Patient Stated Goal: return home OT Goal Formulation: With patient Time For Goal  Achievement: 10/02/24 Potential to Achieve Goals: Fair  Plan      Co-evaluation                 AM-PAC OT 6 Clicks Daily Activity     Outcome Measure   Help from another person eating meals?: None Help from another person taking care of personal grooming?: None Help from another person toileting, which includes using toliet, bedpan, or urinal?: A Little Help from another person bathing (including washing, rinsing, drying)?: A Little Help from another person to put on and taking off regular upper body clothing?: None Help from another person to put on and taking off regular lower body clothing?: A Little 6 Click Score: 21    End of Session Equipment Utilized During Treatment: Oxygen  OT Visit Diagnosis: Unsteadiness on feet (R26.81);Repeated falls (R29.6);Muscle weakness (generalized) (M62.81)   Activity Tolerance Patient tolerated treatment well   Patient Left with call bell/phone within reach;in chair;with chair alarm set   Nurse Communication Mobility status        Time: 8976-8951 OT Time Calculation (min): 25 min  Charges: OT General Charges $OT Visit: 1 Visit OT Treatments $Self Care/Home Management : 23-37 mins  Chas Axel, OTR/L  09/22/24, 11:38 AM   Duwaine FORBES Saupe 09/22/2024, 11:34 AM

## 2024-09-22 NOTE — Progress Notes (Signed)
 PROGRESS NOTE    [name]  FMW:[id number] DOB: [birth year] DOA: 09/16/2024 PCP: Osa Geralds, NP    Assessment & Plan:   Principal Problem:   Acute exacerbation of CHF (congestive heart failure) (HCC) Active Problems:   Coronary artery disease involving coronary bypass graft of native heart without angina pectoris   Iron  deficiency anemia due to chronic blood loss   CAD (coronary artery disease)   Essential hypertension   Elevated troponin   History of stroke s/p left carotid endarterectomy   AKI (acute kidney injury)   GERD without esophagitis   Dyslipidemia   Depression   Abnormality of abdominal aorta   Dissection of infrarenal aorta (HCC)  Assessment and Plan: Acute exacerbation diastolic CHF: echo on 05/10/2024 w/ EF 60 to 65%, grade 1 diastolic dysfunction. Still w/ significant b/l LE pitting edema. Repeat CXR shows vascular congestion & edema, similar to prior CXR. Decreased IV lasix  back to daily as Cr is trending up today. Monitor I/Os. Neg approx 2.6L today so far. Fluid restriction of today and may need to fluid restrict further. Continue w/ compression stockings  Chronic hypoxic respiratory failure: continue on supplemental oxygen, uses 2-3L [place] at home   Hypokalemia: WNL today   Hx of CAD: w/ ventricular ectopy. Continue on home dose of amio     IDA: s/p 1 unit of pRBCs transfused so far. Will monitor stools for blood. Will transfuse if Hb < 7.0   Infrarenal abd aorta dissection: ABI shows mild-mod PAD. Will need to outpatient w/ vasc surg for likely intervention as per vasc surg. Vasc surg following and recs apprec    Elevated troponin: likely secondary to demand ischemia. Trending down   HTN: continue on imdur , metoprolol     HLD: continue on statin    AKI: Cr is trending up today. Will continue to monitor   Peripheral neuropathy: d/c home gabapentin  on 10/1 b/c it did not help as per pt and started and will continue on pregablin    Obesity: BMI 31.3. Would benefit from weight loss        DVT prophylaxis: SCDs Code Status: full Family Communication:  Disposition Plan: likely d/c home w/ HH  Level of care: Telemetry Cardiac Consultants:    Procedures:   Antimicrobials:    Subjective: Pt c/o b/l leg swelling   Objective: Vitals:   09/22/24 0448 09/22/24 0746 09/22/24 1112 09/22/24 1500  BP: 105/66 106/64 109/71 100/60  Pulse: 91 90 87 98  Resp: 18 18 18 19   Temp: (!) 97.5 F (36.4 C) 97.8 F (36.6 C) 97.8 F (36.6 C) 97.8 F (36.6 C)  TempSrc:   Oral Oral  SpO2: 97% 96% 96% 94%  Weight:      Height:        Intake/Output Summary (Last 24 hours) at 09/22/2024 1544 Last data filed at 09/22/2024 1241 Gross per 24 hour  Intake 680 ml  Output 3325 ml  Net -2645 ml   Filed Weights   09/16/24 0926  Weight: 104.8 kg    Examination:  General exam: appears comfortable  Respiratory system: decreased breath sounds b/l  Cardiovascular system: S1/S2+.  Gastrointestinal system: abd is soft, NT, obese & normal bowel sounds Central nervous system: alert & awake. Moves all extremities  Psychiatry: judgement and insight appears at baseline. Flat mood and affect   Data Reviewed: I have personally reviewed following labs and imaging studies  CBC: Recent Labs  Lab 09/16/24 0929 09/17/24 0514 09/18/24 0357 09/19/24 0436 09/20/24  9582 09/21/24 0430 09/22/24 0555  WBC 9.0   < > 9.8 9.9 10.9* 12.5* 10.9*  NEUTROABS 6.5  --   --   --   --   --   --   HGB 7.6*   < > 7.6* 7.3* 7.3* 7.9* 7.2*  HCT 26.1*   < > 26.1* 24.7* 24.4* 26.4* 23.8*  MCV 94.2   < > 93.9 93.6 92.1 93.0 92.6  PLT 354   < > 365 360 363 446* 399   < > = values in this interval not displayed.   Basic Metabolic Panel: Recent Labs  Lab 09/18/24 0357 09/19/24 0436 09/20/24 0417 09/21/24 0430 09/22/24 0555  NA 136 136 136 137 138  K 3.7 3.8 3.9 4.0 4.0  CL 91* 91* 90* 91* 88*  CO2 35* 36* 35* 34* 32  GLUCOSE 153* 136*  108* 120* 151*  BUN 22 21 23 23  29*  CREATININE 1.67* 1.56* 1.33* 1.34* 1.76*  CALCIUM  8.1* 8.2* 8.2* 8.5* 8.8*   GFR: Estimated Creatinine Clearance: 52.4 mL/min (A) (by C-G formula based on SCr of 1.76 mg/dL (H)). Liver Function Tests: Recent Labs  Lab 09/16/24 0929  AST 16  ALT 7  ALKPHOS 60  BILITOT 1.2  PROT 7.7  ALBUMIN  3.1*   No results for input(s): LIPASE, AMYLASE in the last 168 hours. No results for input(s): AMMONIA in the last 168 hours. Coagulation Profile: Recent Labs  Lab 09/16/24 0929  INR 1.2   Cardiac Enzymes: No results for input(s): CKTOTAL, CKMB, CKMBINDEX, TROPONINI in the last 168 hours. BNP (last 3 results) No results for input(s): PROBNP in the last 8760 hours. HbA1C: No results for input(s): HGBA1C in the last 72 hours. CBG: No results for input(s): GLUCAP in the last 168 hours. Lipid Profile: No results for input(s): CHOL, HDL, LDLCALC, TRIG, CHOLHDL, LDLDIRECT in the last 72 hours. Thyroid Function Tests: No results for input(s): TSH, T4TOTAL, FREET4, T3FREE, THYROIDAB in the last 72 hours. Anemia Panel: No results for input(s): VITAMINB12, FOLATE, FERRITIN, TIBC, IRON , RETICCTPCT in the last 72 hours. Sepsis Labs: No results for input(s): PROCALCITON, LATICACIDVEN in the last 168 hours.  No results found for this or any previous visit (from the past 240 hours).       Radiology Studies: DG Chest Port 1 View Result Date: 09/21/2024 EXAM: 1 VIEW(S) XRAY OF THE CHEST 09/21/2024 12:40:32 PM COMPARISON: 09/16/2024 CLINICAL HISTORY: Dyspnea. FINDINGS: LUNGS AND PLEURA: Mild cardiopulmonary vascular congestion and edema similar to prior radiograph. Pneumonia is not excluded. The previously seen right upper lobe nodule is poorly visualized on today's exam. Small bilateral pleural effusions. No pneumothorax. No focal pulmonary opacity. HEART AND MEDIASTINUM: Mild cardiopulmonary vascular  congestion and edema similar to prior radiograph. Median sternotomy wires. No acute abnormality of the cardiac and mediastinal silhouettes. BONES AND SOFT TISSUES: Median sternotomy wires. No acute osseous abnormality. IMPRESSION: 1. Mild cardiopulmonary vascular congestion and edema, similar to prior radiograph 2. Small bilateral pleural effusions Electronically signed by: Vanetta Chou MD 09/21/2024 02:27 PM EDT RP Workstation: [id number]         Scheduled Meds:  sodium chloride   250 mL Intravenous Once   amiodarone   200 mg Oral Daily   ascorbic acid   500 mg Oral Daily   budesonide -glycopyrrolate -formoterol   2 puff Inhalation BID   dapagliflozin  propanediol  10 mg Oral Daily   Fe Fum-Vit C-Vit B12-FA  1 capsule Oral BID   feeding supplement  237 mL Oral BID BM   folic  acid  1 mg Oral Daily   [START ON 09/23/2024] furosemide   40 mg Intravenous Daily   isosorbide  mononitrate  30 mg Oral Daily   metoprolol  succinate  25 mg Oral Daily   multivitamin with minerals  1 tablet Oral Daily   nicotine   14 mg Transdermal Daily   pantoprazole   40 mg Oral BID   polyethylene glycol  17 g Oral Q breakfast   pregabalin  50 mg Oral TID   rosuvastatin   20 mg Oral QHS   thiamine   100 mg Oral Daily   Continuous Infusions:   LOS: 6 days      Anthony CHRISTELLA Pouch, MD [facility] Pager [phone]  If 7PM-7AM, please contact night-coverage [email or web address] 09/22/2024, 3:44 PM

## 2024-09-22 NOTE — Progress Notes (Signed)
 PT Cancellation Note  Patient Details Name: [name] MRN: [id number] DOB: Jul 15, 1961   Cancelled Treatment:    Reason Eval/Treat Not Completed: Patient not medically ready PT hold at this time. PT entered the room and pt. Presented with labored breathing. SpO2 measured at 80% on 3L/min. RN in room to confirm PT should hold treatment at this time.   Saina Waage A Latessa Tillis 09/22/2024, 2:10 PM

## 2024-09-22 NOTE — Plan of Care (Signed)

## 2024-09-23 ENCOUNTER — Other Ambulatory Visit: Payer: Self-pay

## 2024-09-23 DIAGNOSIS — I5033 Acute on chronic diastolic (congestive) heart failure: Secondary | ICD-10-CM | POA: Diagnosis not present

## 2024-09-23 LAB — CBC
HCT: 23.2 % — ABNORMAL LOW (ref 39.0–52.0)
Hemoglobin: 6.9 g/dL — ABNORMAL LOW (ref 13.0–17.0)
MCH: 27.6 pg (ref 26.0–34.0)
MCHC: 29.7 g/dL — ABNORMAL LOW (ref 30.0–36.0)
MCV: 92.8 fL (ref 80.0–100.0)
Platelets: 373 K/uL (ref 150–400)
RBC: 2.5 MIL/uL — ABNORMAL LOW (ref 4.22–5.81)
RDW: 17.7 % — ABNORMAL HIGH (ref 11.5–15.5)
WBC: 11.7 K/uL — ABNORMAL HIGH (ref 4.0–10.5)
nRBC: 0 % (ref 0.0–0.2)

## 2024-09-23 LAB — BASIC METABOLIC PANEL WITH GFR
Anion gap: 13 (ref 5–15)
BUN: 32 mg/dL — ABNORMAL HIGH (ref 8–23)
CO2: 33 mmol/L — ABNORMAL HIGH (ref 22–32)
Calcium: 8.6 mg/dL — ABNORMAL LOW (ref 8.9–10.3)
Chloride: 90 mmol/L — ABNORMAL LOW (ref 98–111)
Creatinine, Ser: 1.54 mg/dL — ABNORMAL HIGH (ref 0.61–1.24)
GFR, Estimated: 50 mL/min — ABNORMAL LOW (ref >60)
Glucose, Bld: 216 mg/dL — ABNORMAL HIGH (ref 70–99)
Potassium: 4.2 mmol/L (ref 3.5–5.1)
Sodium: 136 mmol/L (ref 135–145)

## 2024-09-23 LAB — PREPARE RBC (CROSSMATCH)

## 2024-09-23 LAB — HEMOGLOBIN AND HEMATOCRIT, BLOOD
HCT: 25.3 % — ABNORMAL LOW (ref 39.0–52.0)
Hemoglobin: 7.8 g/dL — ABNORMAL LOW (ref 13.0–17.0)

## 2024-09-23 MED ORDER — SODIUM CHLORIDE 0.9% IV SOLUTION: Freq: Once | INTRAVENOUS | Status: AC

## 2024-09-23 MED ORDER — FUROSEMIDE 10 MG/ML IJ SOLN
40.0000 mg | Freq: Two times a day (BID) | INTRAMUSCULAR | Status: DC
  Administered 2024-09-23 – 2024-09-24 (×2): 40 mg via INTRAVENOUS
  Filled 2024-09-23 (×2): qty 4

## 2024-09-23 NOTE — TOC Progression Note (Signed)
 Transition of Care [facility]) - Progression Note    Patient Details  Name: Kolten Ryback MRN: [id number] Date of Birth: [birth year]  [facility] CM/SW Contact  [name], LCSW Phone Number: 09/23/2024, 1:01 PM  Clinical Narrative:  CSW continues to follow progress for return home with home health.   Expected Discharge Plan: Home w Home Health Services Barriers to Discharge: Continued Medical Work up               Expected Discharge Plan and Services     Post Acute Care Choice: Resumption of Svcs/PTA Provider Living arrangements for the past 2 months: Single Family Home                           HH Arranged: RN, PT, OT, Nurse's Aide HH Agency: [facility] Date [facility] Agency Contacted: 09/17/24   Representative spoke with at [facility] Agency: Daphne   Social Drivers of Health (SDOH) Interventions SDOH Screenings   Food Insecurity: No Food Insecurity (09/16/2024)  Housing: Low Risk  (09/17/2024)  Transportation Needs: No Transportation Needs (09/16/2024)  Utilities: Not At Risk (09/17/2024)  Social Connections: Moderately Isolated (09/17/2024)  Tobacco Use: High Risk (09/16/2024)    Readmission Risk Interventions    09/17/2024   12:28 PM 08/13/2024   12:01 PM 05/14/2024    8:44 AM  Readmission Risk Prevention Plan  Transportation Screening Complete Complete   PCP or Specialist Appt within 3-5 Days   Complete  HRI or Home Care Consult   Complete  Social Work Consult for Recovery Care Planning/Counseling   Complete  Palliative Care Screening   Not Applicable  Medication Review [employer]) Complete Complete Complete  PCP or Specialist appointment within 3-5 days of discharge Complete    HRI or Home Care Consult Complete    SW Recovery Care/Counseling Consult Complete    Palliative Care Screening Not Applicable Not Applicable   Skilled Nursing Facility Not Applicable Complete

## 2024-09-23 NOTE — Progress Notes (Signed)
 PROGRESS NOTE   HPI was taken from Dr. Sherre: [name] is a 63 year old male with history of heart failure preserved ejection fraction, morbid obesity, hypertension, neuropathy, insomnia, hyperlipidemia, coronary artery disease, GERD, chronic oxygen requirement on 2 L nasal cannula, who presents ED for chief concerns of bilateral lower extremity swelling, dizziness, abdominal pain.   Vitals in the ED showed T of 98.5, rr 22, hr 79, blood pressure 104/62, SpO2 99% on 2 L nasal cannula.   Serum sodium is 136, potassium 3.5, chloride 89, bicarb 37, BUN of 17, serum creatinine 1.41, eGFR 55, nonfasting glucose 103, WBC 9.0, hemoglobin 7.6, platelets of 354.   BNP elevated 1583.6.  HS troponin was 30 and on repeat is 29.   UA was negative for leukocytes and nitrates.   ED treatment: Patient is typed and screened, 1 unit of blood ordered for transfusion.  Furosemide  40 mg IV one-time dose, Protonix  80 mg IV one-time dose. ---------------------------------- At bedside, patient was able to tell me his first last name, age, location, current calendar year.   He reports that at midnight this morning, he endorsed abdominal pain that was sharp and on a scale of 0-10, he states it was a 10 out of 10 and the pain was persistent.  Currently he states the pain is 1 out of 10.  He denies trauma to his person.  He reports the pain finally resolved in the emergency room after given furosemide  and he started urinating.   He reports bilateral lower extremity swelling that has been ongoing since his last discharge in the hospital.  He also endorses dizziness that has been persistent and intermittent.   He denies fever, chills, nausea, vomiting, dysuria, hematuria, diarrhea, blood in his stool.  He reports that the his stool has been green in color since he is on iron  tablets.  He reports no black stool.   He denies chest pain.  Reports shortness of breath is worse with exertion   [name]   FMW:[id number] DOB: [birth year] DOA: 09/16/2024 PCP: Osa Geralds, NP    Assessment & Plan:   Principal Problem:   Acute exacerbation of CHF (congestive heart failure) (HCC) Active Problems:   Coronary artery disease involving coronary bypass graft of native heart without angina pectoris   Iron  deficiency anemia due to chronic blood loss   CAD (coronary artery disease)   Essential hypertension   Elevated troponin   History of stroke s/p left carotid endarterectomy   AKI (acute kidney injury)   GERD without esophagitis   Dyslipidemia   Depression   Abnormality of abdominal aorta   Dissection of infrarenal aorta (HCC)  Assessment and Plan: Acute exacerbation diastolic CHF: echo on 05/10/2024 w/ EF 60 to 65%, grade 1 diastolic dysfunction. Still w/ significant b/l LE pitting edema. Repeat CXR shows vascular congestion & edema, similar to prior CXR. Increased IV lasix  back to BID today as Cr has improved.Monitor I/Os. Neg approx 2.4L today so far. Fluid restriction even further today to . Continue w/ compression stockings  Chronic hypoxic respiratory failure: continue on supplemental oxygen, uses 2-3L [place] at home   Hypokalemia: WNL today   Hx of CAD: w/ ventricular ectopy. Continue on home dose of amio     IDA: s/p 1 unit of pRBCs transfused so far. Will monitor for blood in stools. None so far. Will transfuse 1 unit of pRBCs for Hb 6.9. Repeat H&H ordered.   Infrarenal abd aorta dissection: ABI shows mild-mod PAD. Will need  to outpatient w/ vasc surg for likely intervention as per vasc surg. Vasc surg following and recs apprec    Elevated troponin: likely secondary to demand ischemia. Trending down   HTN: continue on metoprolol , imdur     HLD: continue on statin    AKI: Cr is trending down from day prior. Avoid nephrotoxic meds   Peripheral neuropathy: d/c home gabapentin  on 10/1 b/c it did not help as per pt and started and will continue on pregabalin   Obesity: BMI  31.3. Would benefit from weight loss         DVT prophylaxis: SCDs Code Status: full Family Communication:  Disposition Plan: likely d/c home w/ HH  Level of care: Telemetry Cardiac  Status is: Inpatient Remains inpatient appropriate because: requiring pRBCs transfusion today, still diuresing as pt has a lot of fluid still     Consultants:    Procedures:   Antimicrobials:    Subjective: Pt c/o fatigue   Objective: Vitals:   09/23/24 0440 09/23/24 0829 09/23/24 1128 09/23/24 1502  BP: 108/65 105/66 104/76 97/68  Pulse: 87 97 93 83  Resp: 17 18 19 19   Temp: 97.9 F (36.6 C) 97.8 F (36.6 C) 98.1 F (36.7 C) 98 F (36.7 C)  TempSrc:    Oral  SpO2: 99% 94% 95% 96%  Weight:      Height:        Intake/Output Summary (Last 24 hours) at 09/23/2024 1559 Last data filed at 09/23/2024 1435 Gross per 24 hour  Intake 250 ml  Output 2700 ml  Net -2450 ml   Filed Weights   09/16/24 0926  Weight: 104.8 kg    Examination:  General exam: appears calm but uncomfortable  Respiratory system: diminished breath sounds b/l Cardiovascular system: S1 & S2+. No rubs or clicks. 2+ pitting edema of b/l LE Gastrointestinal system: abd is soft, NT, ND & normal bowel sounds Central nervous system: alert & awake. Moves all extremities  Psychiatry: judgement and insight appears at baseline. Flat mood and affect   Data Reviewed: I have personally reviewed following labs and imaging studies  CBC: Recent Labs  Lab 09/19/24 0436 09/20/24 0417 09/21/24 0430 09/22/24 0555 09/23/24 1100  WBC 9.9 10.9* 12.5* 10.9* 11.7*  HGB 7.3* 7.3* 7.9* 7.2* 6.9*  HCT 24.7* 24.4* 26.4* 23.8* 23.2*  MCV 93.6 92.1 93.0 92.6 92.8  PLT 360 363 446* 399 373   Basic Metabolic Panel: Recent Labs  Lab 09/19/24 0436 09/20/24 0417 09/21/24 0430 09/22/24 0555 09/23/24 1100  NA 136 136 137 138 136  K 3.8 3.9 4.0 4.0 4.2  CL 91* 90* 91* 88* 90*  CO2 36* 35* 34* 32 33*  GLUCOSE 136* 108*  120* 151* 216*  BUN 21 23 23  29* 32*  CREATININE 1.56* 1.33* 1.34* 1.76* 1.54*  CALCIUM  8.2* 8.2* 8.5* 8.8* 8.6*   GFR: Estimated Creatinine Clearance: 59.9 mL/min (A) (by C-G formula based on SCr of 1.54 mg/dL (H)). Liver Function Tests: No results for input(s): AST, ALT, ALKPHOS, BILITOT, PROT, ALBUMIN  in the last 168 hours.  No results for input(s): LIPASE, AMYLASE in the last 168 hours. No results for input(s): AMMONIA in the last 168 hours. Coagulation Profile: No results for input(s): INR, PROTIME in the last 168 hours.  Cardiac Enzymes: No results for input(s): CKTOTAL, CKMB, CKMBINDEX, TROPONINI in the last 168 hours. BNP (last 3 results) No results for input(s): PROBNP in the last 8760 hours. HbA1C: No results for input(s): HGBA1C in the last 72  hours. CBG: No results for input(s): GLUCAP in the last 168 hours. Lipid Profile: No results for input(s): CHOL, HDL, LDLCALC, TRIG, CHOLHDL, LDLDIRECT in the last 72 hours. Thyroid Function Tests: No results for input(s): TSH, T4TOTAL, FREET4, T3FREE, THYROIDAB in the last 72 hours. Anemia Panel: No results for input(s): VITAMINB12, FOLATE, FERRITIN, TIBC, IRON , RETICCTPCT in the last 72 hours. Sepsis Labs: No results for input(s): PROCALCITON, LATICACIDVEN in the last 168 hours.  No results found for this or any previous visit (from the past 240 hours).       Radiology Studies: No results found.        Scheduled Meds:  sodium chloride   250 mL Intravenous Once   sodium chloride    Intravenous Once   amiodarone   200 mg Oral Daily   ascorbic acid   500 mg Oral Daily   budesonide -glycopyrrolate -formoterol   2 puff Inhalation BID   dapagliflozin  propanediol  10 mg Oral Daily   Fe Fum-Vit C-Vit B12-FA  1 capsule Oral BID   feeding supplement  237 mL Oral BID BM   folic acid   1 mg Oral Daily   furosemide   40 mg Intravenous Daily   isosorbide   mononitrate  30 mg Oral Daily   metoprolol  succinate  25 mg Oral Daily   multivitamin with minerals  1 tablet Oral Daily   nicotine   14 mg Transdermal Daily   pantoprazole   40 mg Oral BID   polyethylene glycol  17 g Oral Q breakfast   pregabalin  50 mg Oral TID   rosuvastatin   20 mg Oral QHS   thiamine   100 mg Oral Daily   Continuous Infusions:   LOS: 7 days      Anthony CHRISTELLA Pouch, MD [facility] Pager [phone]  If 7PM-7AM, please contact night-coverage [email or web address] 09/23/2024, 3:59 PM

## 2024-09-23 NOTE — Progress Notes (Signed)
 Nutrition Follow-up  DOCUMENTATION CODES:   Obesity unspecified  INTERVENTION:   -Continue 2 gram sodium diet -Continue Ensure Plus High Protein po BID, each supplement provides 350 kcal and 20 grams of protein  -Continue MVI with minerals daily  NUTRITION DIAGNOSIS:   Increased nutrient needs related to chronic illness (CHF) as evidenced by estimated needs.  Ongoing  GOAL:   Patient will meet greater than or equal to 90% of their needs  Progressing   MONITOR:   PO intake, Supplement acceptance  REASON FOR ASSESSMENT:   Malnutrition Screening Tool    ASSESSMENT:   Pt with history of heart failure preserved ejection fraction, morbid obesity, hypertension, neuropathy, insomnia, hyperlipidemia, coronary artery disease, GERD, chronic oxygen requirement on 2 L nasal cannula, who presents for chief concerns of bilateral lower extremity swelling, dizziness, abdominal pain.  Reviewed I/O's: -2.6 L x 24 hours and -11.5 L since admission  UOP: 3 L x 24 hours   Pt lying in bed, sleeping soundly at time of visit. He did not arouse to voice or touch. No family present at time of visit.   Pt with good appetite. Noted meal completions 50-100%. Pt also drinking Ensure supplements.   No new wt since last visit.   Per TOC notes, plan to discharge home with home health services.   Medications reviewed and include vitamin C , folic acid , lasix , protonix , miralax , and thiamine .   Labs reviewed.    NUTRITION - FOCUSED PHYSICAL EXAM:  Flowsheet Row Most Recent Value  Orbital Region No depletion  Upper Arm Region No depletion  Thoracic and Lumbar Region No depletion  Buccal Region No depletion  Temple Region No depletion  Clavicle Bone Region No depletion  Clavicle and Acromion Bone Region No depletion  Scapular Bone Region No depletion  Dorsal Hand No depletion  Patellar Region No depletion  Anterior Thigh Region No depletion  Posterior Calf Region No depletion  Edema (RD  Assessment) Moderate  Hair Reviewed  Eyes Reviewed  Mouth Reviewed  Skin Reviewed  Nails Reviewed    Diet Order:   Diet Order             Diet 2 gram sodium Fluid consistency: Thin; Fluid restriction: 1800 mL Fluid  Diet effective now                   EDUCATION NEEDS:   No education needs have been identified at this time  Skin:  Skin Assessment: Reviewed RN Assessment  Last BM:  Unknown  Height:   Ht Readings from Last 1 Encounters:  09/16/24 6' (1.829 m)    Weight:   Wt Readings from Last 1 Encounters:  09/16/24 104.8 kg    Ideal Body Weight:  80.9 kg  BMI:  Body mass index is 31.33 kg/m.  Estimated Nutritional Needs:   Kcal:  2000-2200  Protein:  100-115 grams  Fluid:  2.0-2.2 L    Margery ORN, RD, LDN, CDCES Registered Dietitian III Certified Diabetes Care and Education Specialist If unable to reach this RD, please use RD Inpatient group chat on secure chat between hours of 8am-4 pm daily

## 2024-09-23 NOTE — Progress Notes (Signed)
 PT Cancellation Note  Patient Details Name: Codey Burling MRN: [id number] DOB: [birth year]   Cancelled Treatment:    Reason Eval/Treat Not Completed: Other (comment) (Just getting back to bed from the chair and waiting to get a unit of blood. PT will continue to follow.)  Randine Essex, PT, MPT  Randine LULLA Essex 09/23/2024, 3:23 PM

## 2024-09-24 DIAGNOSIS — I5041 Acute combined systolic (congestive) and diastolic (congestive) heart failure: Secondary | ICD-10-CM

## 2024-09-24 LAB — BASIC METABOLIC PANEL WITH GFR
Anion gap: 11 (ref 5–15)
BUN: 39 mg/dL — ABNORMAL HIGH (ref 8–23)
CO2: 37 mmol/L — ABNORMAL HIGH (ref 22–32)
Calcium: 8.8 mg/dL — ABNORMAL LOW (ref 8.9–10.3)
Chloride: 90 mmol/L — ABNORMAL LOW (ref 98–111)
Creatinine, Ser: 1.64 mg/dL — ABNORMAL HIGH (ref 0.61–1.24)
GFR, Estimated: 46 mL/min — ABNORMAL LOW (ref >60)
Glucose, Bld: 124 mg/dL — ABNORMAL HIGH (ref 70–99)
Potassium: 4.1 mmol/L (ref 3.5–5.1)
Sodium: 138 mmol/L (ref 135–145)

## 2024-09-24 LAB — CBC
HCT: 25 % — ABNORMAL LOW (ref 39.0–52.0)
Hemoglobin: 7.7 g/dL — ABNORMAL LOW (ref 13.0–17.0)
MCH: 28.4 pg (ref 26.0–34.0)
MCHC: 30.8 g/dL (ref 30.0–36.0)
MCV: 92.3 fL (ref 80.0–100.0)
Platelets: 374 K/uL (ref 150–400)
RBC: 2.71 MIL/uL — ABNORMAL LOW (ref 4.22–5.81)
RDW: 18.3 % — ABNORMAL HIGH (ref 11.5–15.5)
WBC: 12.3 K/uL — ABNORMAL HIGH (ref 4.0–10.5)
nRBC: 0 % (ref 0.0–0.2)

## 2024-09-24 LAB — TYPE AND SCREEN
ABO/RH(D): A POS
Antibody Screen: NEGATIVE
Unit division: 0

## 2024-09-24 LAB — BPAM RBC
Blood Product Expiration Date: 202510072359
ISSUE DATE / TIME: 202510071520
Unit Type and Rh: 600

## 2024-09-24 NOTE — Plan of Care (Signed)

## 2024-09-24 NOTE — Progress Notes (Signed)
 Physical Therapy Treatment Patient Details Name: [name] MRN: [id number] DOB: 03-14-1961 Today's Date: 09/24/2024   History of Present Illness Patient is a 63 year old male with bilateral LE swelling, dizziness, abdominal pain, acute exacerbation of diastolic CHF. PMH: heart failure preserved ejection fraction, morbid obesity, hypertension, neuropathy, insomnia, hyperlipidemia, coronary artery disease, GERD, chronic oxygen requirement on 2 L nasal cannula    PT Comments  Patient is agreeable to PT session. He was able to stand from the bed without physical assistance. Standing balance is fair with no loss of balance using the walker. Walking deferred due to fatigue. Educated patient on energy conservation strategies to use in home setting. Recommend to continue PT to maximize independence and facilitate return to prior level of function.    If plan is discharge home, recommend the following: Assist for transportation;Assistance with cooking/housework   Can travel by private vehicle        Equipment Recommendations  None recommended by PT    Recommendations for Other Services       Precautions / Restrictions Precautions Precautions: Fall Recall of Precautions/Restrictions: Intact Restrictions Weight Bearing Restrictions Per Provider Order: No     Mobility  Bed Mobility               General bed mobility comments: not addressed as patient sitting up on arrival and post session    Transfers Overall transfer level: Needs assistance Equipment used: Rolling walker (2 wheels) Transfers: Sit to/from Stand Sit to Stand: Supervision           General transfer comment: encouraged use of rolling walker for safety. no physical assistance required for standing    Ambulation/Gait               General Gait Details: deferred due to fatigue. standing tolerance of around 2 minutes with mild dyspnea   Stairs             Wheelchair Mobility     Tilt  Bed    Modified Rankin (Stroke Patients Only)       Balance Overall balance assessment: Needs assistance Sitting-balance support: Feet supported Sitting balance-Leahy Scale: Good     Standing balance support: Bilateral upper extremity supported Standing balance-Leahy Scale: Fair                              Hotel manager: No apparent difficulties  Cognition Arousal: Alert Behavior During Therapy: WFL for tasks assessed/performed   PT - Cognitive impairments: No apparent impairments                                Cueing Cueing Techniques: Verbal cues  Exercises      General Comments General comments (skin integrity, edema, etc.): education on energy conservation strateiges. encouraged patient to elevate BLE for edema management      Pertinent Vitals/Pain Pain Assessment Pain Assessment: No/denies pain    Home Living                          Prior Function            PT Goals (current goals can now be found in the care plan section) Acute Rehab PT Goals Patient Stated Goal: to go home PT Goal Formulation: With patient Time For Goal Achievement: 10/02/24 Potential to Achieve Goals: Good Progress  towards PT goals: Progressing toward goals    Frequency    Min 2X/week      PT Plan      Co-evaluation              AM-PAC PT 6 Clicks Mobility   Outcome Measure  Help needed turning from your back to your side while in a flat bed without using bedrails?: None Help needed moving from lying on your back to sitting on the side of a flat bed without using bedrails?: A Little Help needed moving to and from a bed to a chair (including a wheelchair)?: A Little Help needed standing up from a chair using your arms (e.g., wheelchair or bedside chair)?: A Little Help needed to walk in hospital room?: A Little Help needed climbing 3-5 steps with a railing? : A Little 6 Click Score: 19    End  of Session Equipment Utilized During Treatment: Oxygen Activity Tolerance: Patient limited by fatigue Patient left: in bed;Other (comment) (seated on side of bed eating)   PT Visit Diagnosis: Unsteadiness on feet (R26.81);Muscle weakness (generalized) (M62.81)     Time: 8656-8587 PT Time Calculation (min) (ACUTE ONLY): 29 min  Charges:    $Therapeutic Activity: 23-37 mins PT General Charges $$ ACUTE PT VISIT: 1 Visit                     Randine Essex, PT, MPT   Randine LULLA Essex 09/24/2024, 2:23 PM

## 2024-09-24 NOTE — TOC Transition Note (Signed)
 Transition of Care [facility]) - Discharge Note   Patient Details  Name: [name] MRN: [id number] Date of Birth: March 25, 1961  [facility] of [facility]) CM/SW Contact:  [name], LCSW Phone Number: 09/24/2024, 11:14 AM   Clinical Narrative:  Patient has orders to discharge home today. [facility] liaison is aware. CSW left message for [employer] liaison so they can reach out regarding outpatient POC evaluation. No further concerns. CSW signing off.   Final next level of care: Home w Home Health Services Barriers to Discharge: Barriers Resolved   Patient Goals and CMS Choice            Discharge Placement                Patient to be transferred to facility by: [name]   Patient and family notified of of transfer: 09/24/24  Discharge Plan and Services Additional resources added to the After Visit Summary for       Post Acute Care Choice: Resumption of Svcs/PTA Provider                    HH Arranged: RN, PT, OT, Nurse's Aide HH Agency: [facility] Date [name] Agency Contacted: 09/24/24   Representative spoke with at [facility] Agency: Leotis  Social Drivers of Health (SDOH) Interventions SDOH Screenings   Food Insecurity: No Food Insecurity (09/16/2024)  Housing: Low Risk  (09/17/2024)  Transportation Needs: No Transportation Needs (09/16/2024)  Utilities: Not At Risk (09/17/2024)  Social Connections: Moderately Isolated (09/17/2024)  Tobacco Use: High Risk (09/16/2024)     Readmission Risk Interventions    09/17/2024   12:28 PM 08/13/2024   12:01 PM 05/14/2024    8:44 AM  Readmission Risk Prevention Plan  Transportation Screening Complete Complete   PCP or Specialist Appt within 3-5 Days   Complete  HRI or Home Care Consult   Complete  Social Work Consult for Recovery Care Planning/Counseling   Complete  Palliative Care Screening   Not Applicable  Medication Review Oceanographer) Complete Complete Complete  PCP or Specialist appointment  within 3-5 days of discharge Complete    HRI or Home Care Consult Complete    SW Recovery Care/Counseling Consult Complete    Palliative Care Screening Not Applicable Not Applicable   Skilled Nursing Facility Not Applicable Complete

## 2024-09-24 NOTE — Discharge Summary (Signed)
 Physician Discharge Summary   Patient: [name] MRN: [id number] DOB: 29-Jul-1961  Admit date:     09/16/2024  Discharge date: 09/24/24  Discharge Physician: Drue ONEIDA Potter   PCP: Osa Geralds, NP   Recommendations at discharge:  Follow up with PCP  Discharge Diagnoses: Principal Problem:   Acute exacerbation of CHF (congestive heart failure) (HCC) Active Problems:   Coronary artery disease involving coronary bypass graft of native heart without angina pectoris   Iron  deficiency anemia due to chronic blood loss   CAD (coronary artery disease)   Essential hypertension   Elevated troponin   History of stroke s/p left carotid endarterectomy   AKI (acute kidney injury)   GERD without esophagitis   Dyslipidemia   Depression   Abnormality of abdominal aorta   Dissection of infrarenal aorta (HCC)  Resolved Problems:   * No resolved hospital problems. *   Hospital Course: [name] is a 63 year old male with history of heart failure preserved ejection fraction, morbid obesity, hypertension, neuropathy, insomnia, hyperlipidemia, coronary artery disease, GERD, chronic oxygen requirement on 2 L nasal cannula, who presents ED for chief concerns of bilateral lower extremity swelling, dizziness, abdominal pain.   Vitals in the ED showed T of 98.5, rr 22, hr 79, blood pressure 104/62, SpO2 99% on 2 L nasal cannula.   Serum sodium is 136, potassium 3.5, chloride 89, bicarb 37, BUN of 17, serum creatinine 1.41, eGFR 55, nonfasting glucose 103, WBC 9.0, hemoglobin 7.6, platelets of 354.   BNP elevated 1583.6.  HS troponin was 30 and on repeat is 29.   UA was negative for leukocytes and nitrates.   ED treatment: Patient is typed and screened, 1 unit of blood ordered for transfusion.  Furosemide  40 mg IV one-time dose, Protonix  80 mg IV one-time dose. ---------------------------------- At bedside, patient was able to tell me his first last name, age, location, current  calendar year.   He reports that at midnight this morning, he endorsed abdominal pain that was sharp and on a scale of 0-10, he states it was a 10 out of 10 and the pain was persistent.  Currently he states the pain is 1 out of 10.  He denies trauma to his person.  He reports the pain finally resolved in the emergency room after given furosemide  and he started urinating.   He reports bilateral lower extremity swelling that has been ongoing since his last discharge in the hospital.  He also endorses dizziness that has been persistent and intermittent.  Assessment and Plan: Acute exacerbation diastolic CHF: echo on 05/10/2024 w/ EF 60 to 65%, grade 1 diastolic dysfunction.  Back on baseline oxygen requirement Able to move about PT OT has recommended home health He will transition to oral diuretic Patient will follow-up with PCP and cardiology   Chronic hypoxic respiratory failure: continue on supplemental oxygen, uses 2-3L [place] at home    Hypokalemia: WNL today    Hx of CAD: w/ ventricular ectopy. Continue on home dose of amio     IDA: s/p 1 unit of pRBCs transfused so far. Will monitor for blood in stools. None so far.     Infrarenal abd aorta dissection: ABI shows mild-mod PAD. Will need to outpatient w/ vasc surg for likely intervention as per vasc surg. Vasc surg following and recs apprec    Elevated troponin: likely secondary to demand ischemia. Trending down   HTN: continue on metoprolol , imdur     HLD: continue on statin  AKI: Cr is trending down from day prior. Avoid nephrotoxic meds     Obesity: BMI 31.3. Would benefit from weight loss     Consultants: As mentioned above Procedures performed: None Disposition: Home health Diet recommendation:  Cardiac diet DISCHARGE MEDICATION: Allergies as of 09/24/2024       Reactions   Wellbutrin  [bupropion ]    Paranoia         Medication List     STOP taking these medications    losartan  25 MG tablet Commonly known  as: COZAAR        TAKE these medications    albuterol  108 (90 Base) MCG/ACT inhaler Commonly known as: VENTOLIN  HFA Inhale 2 puffs into the lungs every 6 (six) hours as needed for wheezing or shortness of breath.   amiodarone  200 MG tablet Commonly known as: PACERONE  Take 1 tablet (200 mg total) by mouth daily.   ascorbic acid  500 MG tablet Commonly known as: VITAMIN C  Take 1 tablet (500 mg total) by mouth daily.   aspirin  EC 81 MG tablet Take 1 tablet (81 mg total) by mouth daily. Swallow whole.   CertaVite/Antioxidants Tabs Take 1 tablet by mouth daily.   clopidogrel  75 MG tablet Commonly known as: PLAVIX  Take 1 tablet (75 mg total) by mouth daily with breakfast.   dapagliflozin  propanediol 10 MG Tabs tablet Commonly known as: Farxiga  Take 1 tablet (10 mg total) by mouth daily.   Fe Fum-Vit C-Vit B12-FA Caps capsule Commonly known as: TRIGELS-F FORTE Take 1 capsule by mouth 2 (two) times daily.   gabapentin  300 MG capsule Commonly known as: NEURONTIN  Take 1 capsule (300 mg total) by mouth 2 (two) times daily.   isosorbide  mononitrate 30 MG 24 hr tablet Commonly known as: IMDUR  Take 1 tablet (30 mg total) by mouth daily.   KP Folic Acid  1 MG tablet Generic drug: folic acid  Take 1 mg by mouth daily.   lactulose  10 GM/15ML solution Commonly known as: CHRONULAC  Take 30 mLs (20 g total) by mouth daily as needed for moderate constipation or mild constipation.   melatonin 5 MG Tabs Place 1 tablet (5 mg total) into feeding tube at bedtime as needed (sleep).   metoprolol  succinate 25 MG 24 hr tablet Commonly known as: TOPROL -XL Take 1 tablet (25 mg total) by mouth daily.   Mucinex  DM 30-600 MG Tb12 Take 1 tablet by mouth 2 (two) times daily as needed for cough.   nicotine  7 mg/24hr patch Commonly known as: NICODERM CQ  - dosed in mg/24 hr One 7mg  patch chest wall daily   pantoprazole  40 MG tablet Commonly known as: Protonix  Take 1 tablet (40 mg total) by  mouth daily.   polyethylene glycol powder 17 GM/SCOOP powder Commonly known as: GLYCOLAX /MIRALAX  Take 17 g by mouth at bedtime. Mix as directed.   rosuvastatin  20 MG tablet Commonly known as: CRESTOR  Take 1 tablet (20 mg total) by mouth daily.   thiamine  100 MG tablet Commonly known as: VITAMIN B1 Take 1 tablet (100 mg total) by mouth daily.   torsemide  20 MG tablet Commonly known as: DEMADEX  Take 1 tablet (20 mg total) by mouth daily.   Trelegy Ellipta  200-62.5-25 MCG/ACT Aepb Generic drug: Fluticasone -Umeclidin-Vilant Inhale 1 Inhalation into the lungs daily at 12 noon.   True Vitamin D3 10 MCG (400 UNIT) Caps Generic drug: Cholecalciferol  Take 2 capsules by mouth daily.   Vitamin D  (Ergocalciferol ) 1.25 MG (50000 UNIT) Caps capsule Commonly known as: DRISDOL  Take 1 capsule (50,000 Units total) by  mouth every 7 (seven) days.               Durable Medical Equipment  (From admission, onward)           Start     Ordered   09/18/24 1455  For home use only DME Other see comment  Once       Comments: POC eval 1-5 L pulse dose. If qualifies, dispense  Question:  Length of Need  Answer:  12 Months   09/18/24 1454            Follow-up Information     Dew, Selinda RAMAN, MD Follow up.   Specialties: Vascular Surgery, Radiology, Interventional Cardiology Contact information: [address] [place] [address] [phone]                Discharge Exam: [name]   09/16/24 0926  Weight: 104.8 kg   General exam: appears calm but uncomfortable  Respiratory system: diminished breath sounds b/l Cardiovascular system: S1 & S2+. No rubs or clicks. 2+ pitting edema of b/l LE Gastrointestinal system: abd is soft, NT, ND & normal bowel sounds Central nervous system: alert & awake. Moves all extremities  Psychiatry: judgement and insight appears at baseline. Flat mood and affect  Condition at discharge: good  The results of  significant diagnostics from this hospitalization (including imaging, microbiology, ancillary and laboratory) are listed below for reference.   Imaging Studies: DG Chest Port 1 View Result Date: 09/21/2024 EXAM: 1 VIEW(S) XRAY OF THE CHEST 09/21/2024 12:40:32 PM COMPARISON: 09/16/2024 CLINICAL HISTORY: Dyspnea. FINDINGS: LUNGS AND PLEURA: Mild cardiopulmonary vascular congestion and edema similar to prior radiograph. Pneumonia is not excluded. The previously seen right upper lobe nodule is poorly visualized on today's exam. Small bilateral pleural effusions. No pneumothorax. No focal pulmonary opacity. HEART AND MEDIASTINUM: Mild cardiopulmonary vascular congestion and edema similar to prior radiograph. Median sternotomy wires. No acute abnormality of the cardiac and mediastinal silhouettes. BONES AND SOFT TISSUES: Median sternotomy wires. No acute osseous abnormality. IMPRESSION: 1. Mild cardiopulmonary vascular congestion and edema, similar to prior radiograph 2. Small bilateral pleural effusions Electronically signed by: Vanetta Chou MD 09/21/2024 02:27 PM EDT RP Workstation: [id number]   US  ARTERIAL LOWER EXTREMITY DUPLEX BILATERAL Result Date: 09/17/2024 CLINICAL DATA:  Lower extremity claudication, weak Doppler pulses EXAM: BILATERAL LOWER EXTREMITY ARTERIAL DUPLEX SCAN TECHNIQUE: Gray-scale sonography as well as color Doppler and duplex ultrasound was performed to evaluate the arteries of both lower extremities including the common, superficial and profunda femoral arteries, popliteal artery and calf arteries. COMPARISON:  None Available. FINDINGS: Right Lower Extremity ABI: 0.85 Inflow: Normal common femoral arterial waveforms and velocities. No evidence of inflow (aortoiliac) disease. Outflow: Extensive atherosclerotic plaque in the common and superficial femoral artery. There appears to be a short segment occlusion of the proximal SFA with collateral reconstitution. Flow is minimal in the mid  thigh. Runoff: Normal posterior and anterior tibial arterial waveforms and velocities. Vessels are patent to the ankle. Left Lower Extremity ABI: 0.84 Inflow: Normal common femoral arterial waveforms and velocities. No evidence of inflow (aortoiliac) disease. Outflow: Extensive calcified atherosclerotic plaque throughout the femoral system. Shadowing obscures the artery in several places. Suspect superficial femoral artery disease. Runoff: Normal posterior and anterior tibial arterial waveforms and velocities. Vessels are patent to the ankle. IMPRESSION: 1. Abnormal bilateral resting ankle-brachial indices consistent with at least mild peripheral arterial disease. 2. Extensive calcified atherosclerotic plaque throughout the superficial femoral arteries bilaterally. Suspect significant stenosis  versus focal occlusion of the SFAs bilaterally. Electronically Signed   By: Wilkie Lent M.D.   On: 09/17/2024 15:58   US  ARTERIAL ABI (SCREENING LOWER EXTREMITY) Result Date: 09/17/2024 CLINICAL DATA:  ABI screening in a patient history of hyperlipidemia,, hypertension smoking, and previous vascular surgery EXAM: NONINVASIVE PHYSIOLOGIC VASCULAR STUDY OF BILATERAL LOWER EXTREMITIES TECHNIQUE: Evaluation of both lower extremities were performed at rest, including calculation of ankle-brachial indices with single level Doppler, pressure and pulse volume recording. COMPARISON:  None Available. FINDINGS: Right ABI:  0.85 Left ABI:  0.84 Right Lower Extremity:  Biphasic arterial waveforms at the ankle. Left Lower Extremity:  Biphasic arterial waveforms at the ankle. IMPRESSION: Mild PVD bilateral lower extremities Electronically Signed   By: Cordella Banner   On: 09/17/2024 15:13   CT ANGIO GI BLEED Result Date: 09/16/2024 CLINICAL DATA:  Lower abdominal pain, hemoccult positive stool EXAM: CTA ABDOMEN AND PELVIS WITHOUT AND WITH CONTRAST TECHNIQUE: Multidetector CT imaging of the abdomen and pelvis was performed using  the standard protocol during bolus administration of intravenous contrast. Multiplanar reconstructed images and MIPs were obtained and reviewed to evaluate the vascular anatomy. Noncontrast CT images of the abdomen and pelvis were also obtained. RADIATION DOSE REDUCTION: This exam was performed according to the departmental dose-optimization program which includes automated exposure control, adjustment of the mA and/or kV according to patient size and/or use of iterative reconstruction technique. CONTRAST:  OMNIPAQUE  IOHEXOL  350 MG/ML SOLN COMPARISON:  August 21, 2024 FINDINGS: VASCULAR Aorta: Tortuous abdominal aorta with ectasia of the infrarenal aorta, measuring up to 2.8 cm with associated focal dissection of the aorta. Celiac: Likely moderate stenosis of the proximal celiac artery. SMA: At least moderate stenosis of the proximal superior mesenteric artery. Renals: Moderate to severe stenosis of the bilateral renal arteries. IMA: Patent with no significant stenosis. Inflow: Atherosclerotic plaque in the bilateral iliac arteries with likely moderate multifocal stenoses in the bilateral common iliac arteries, penetrating atheromatous plaque in the right common iliac artery, moderate to severe stenoses of the bilateral internal iliac arteries, and likely mild multifocal stenoses in the external iliac artery. Proximal Outflow: Likely mild stenoses in the bilateral common femoral arteries. Likely severe stenosis of the proximal right superficial femoral artery and at least moderate stenosis of the proximal left superficial femoral artery. Bilateral profundus femoral arteries are patent. Veins: Portal vein, splenic vein, and SMV are patent. No obvious acute venous abnormality on this arterial phase study. No evidence of arterial contrast extravasation to suggest active GI bleeding. Review of the MIP images confirms the above findings. NON-VASCULAR Lower chest: Small bilateral pleural effusions. Hepatobiliary:  No suspicious liver lesions. Gallbladder is surgically absent. No biliary ductal dilation. Pancreas: Unremarkable. Spleen: Unremarkable. Adrenals/Urinary Tract: Adrenal glands are unremarkable. No nephrolithiasis or hydronephrosis. Stomach/Bowel: No evidence of bowel obstruction or inflammation. Appendix is not well visualized. Lymphatic: No lymphadenopathy by size criteria. Reproductive: Unremarkable. Other: Trace volume ascites. No free air. Small fat containing bilateral inguinal hernias. Musculoskeletal: Unchanged mild chronic T12 vertebral body compression deformity. IMPRESSION: VASCULAR 1. No evidence of active arterial GI bleeding. 2. Extensive atherosclerotic plaque as described above. 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 3. Likely moderate stenosis of the proximal celiac artery and superior mesenteric artery. 4. At least moderate stenoses in the bilateral renal arteries. 5. Likely moderate multifocal stenoses in the bilateral common iliac arteries and moderate to severe stenoses of the bilateral internal iliac arteries. 6. Severe stenosis of the visualized proximal right superficial  femoral artery. Moderate to severe stenosis of the visualized left proximal superficial femoral artery. NON-VASCULAR 1. Small bilateral pleural effusions and trace volume ascites. Electronically Signed   By: Michaeline Blanch M.D.   On: 09/16/2024 11:57   DG Chest 1 View Result Date: 09/16/2024 CLINICAL DATA:  Chronic shortness of breath with bilateral leg swelling and dizziness. EXAM: CHEST  1 VIEW COMPARISON:  Chest x-ray 08/16/2024, chest CT 08/21/2024 FINDINGS: Lungs are adequately inflated. There is mild prominence and indistinctness of the central pulmonary vessels suggesting a degree of vascular congestion. Minimal hazy density over the medial lung bases/retrocardiac regions which may be due to small amount of layering pleural fluid/atelectasis. Stable nodule opacity over the lateral right  mid to upper lung as seen on recent chest CT. Stable cardiomegaly. Remainder the exam is unchanged. IMPRESSION: 1. Stable cardiomegaly with suggestion of mild vascular congestion. 2. Minimal hazy density over the medial lung bases/retrocardiac regions which may be due to small amount of layering pleural fluid/atelectasis. 3. Stable nodule opacity over the lateral right mid to upper lung as seen on recent chest CT. Electronically Signed   By: Toribio Agreste M.D.   On: 09/16/2024 10:28    Microbiology: Results for orders placed or performed during the hospital encounter of 08/12/24  Blood culture (routine x 2)     Status: None   Collection Time: 08/12/24  3:07 PM   Specimen: BLOOD  Result Value Ref Range Status   Specimen Description BLOOD BLOOD RIGHT FOREARM  Final   Special Requests   Final    BOTTLES DRAWN AEROBIC AND ANAEROBIC Blood Culture results may not be optimal due to an inadequate volume of blood received in culture bottles   Culture   Final    NO GROWTH 5 DAYS Performed at [facility], [address] [place], [place] [address]    Report Status 08/17/2024 FINAL  Final  Blood culture (routine x 2)     Status: None   Collection Time: 08/12/24  3:07 PM   Specimen: BLOOD  Result Value Ref Range Status   Specimen Description BLOOD LEFT WRIST  Final   Special Requests   Final    BOTTLES DRAWN AEROBIC AND ANAEROBIC Blood Culture results may not be optimal due to an inadequate volume of blood received in culture bottles   Culture   Final    NO GROWTH 5 DAYS Performed at [facility], [address], [place], [place] [address]    Report Status 08/17/2024 FINAL  Final  MRSA Next Gen by PCR, Nasal     Status: Abnormal   Collection Time: 08/13/24  7:51 AM   Specimen: Nasal Mucosa; Nasal Swab  Result Value Ref Range Status   MRSA by PCR Next Gen DETECTED (A) NOT DETECTED Final    Comment: RESULT CALLED TO, READ BACK BY AND VERIFIED WITH: DEBBIE PANKERTON 08/13/24  0930 MW (NOTE) The GeneXpert MRSA Assay (FDA approved for NASAL specimens only), is one component of a comprehensive MRSA colonization surveillance program. It is not intended to diagnose MRSA infection nor to guide or monitor treatment for MRSA infections. Test performance is not FDA approved in patients less than 55 years old. Performed at [facility], [address] [place], [place] [address]   Resp panel by RT-PCR (RSV, Flu A&B, Covid) Anterior Nasal Swab     Status: Abnormal   Collection Time: 08/13/24  7:51 AM   Specimen: Anterior Nasal Swab  Result Value Ref Range Status  SARS Coronavirus 2 by RT PCR POSITIVE (A) NEGATIVE Final    Comment: (NOTE) SARS-CoV-2 target nucleic acids are DETECTED.  The SARS-CoV-2 RNA is generally detectable in upper respiratory specimens during the acute phase of infection. Positive results are indicative of the presence of the identified virus, but do not rule out bacterial infection or co-infection with other pathogens not detected by the test. Clinical correlation with patient history and other diagnostic information is necessary to determine patient infection status. The expected result is Negative.  Fact Sheet for Patients: BloggerCourse.com  Fact Sheet for Healthcare Providers: SeriousBroker.it  This test is not yet approved or cleared by the [place]  FDA and  has been authorized for detection and/or diagnosis of SARS-CoV-2 by FDA under an Emergency Use Authorization (EUA).  This EUA will remain in effect (meaning this test can be used) for the duration of  the COVID-19 declaration under Section 564(b)(1) of the A ct, 21 U.S.C. section 360bbb-3(b)(1), unless the authorization is terminated or revoked sooner.     Influenza A by PCR NEGATIVE NEGATIVE Final   Influenza B by PCR NEGATIVE NEGATIVE Final    Comment: (NOTE) The Xpert Xpress SARS-CoV-2/FLU/RSV plus assay is  intended as an aid in the diagnosis of influenza from Nasopharyngeal swab specimens and should not be used as a sole basis for treatment. Nasal washings and aspirates are unacceptable for Xpert Xpress SARS-CoV-2/FLU/RSV testing.  Fact Sheet for Patients: BloggerCourse.com  Fact Sheet for Healthcare Providers: SeriousBroker.it  This test is not yet approved or cleared by the [place]  FDA and has been authorized for detection and/or diagnosis of SARS-CoV-2 by FDA under an Emergency Use Authorization (EUA). This EUA will remain in effect (meaning this test can be used) for the duration of the COVID-19 declaration under Section 564(b)(1) of the Act, 21 U.S.C. section 360bbb-3(b)(1), unless the authorization is terminated or revoked.     Resp Syncytial Virus by PCR NEGATIVE NEGATIVE Final    Comment: (NOTE) Fact Sheet for Patients: BloggerCourse.com  Fact Sheet for Healthcare Providers: SeriousBroker.it  This test is not yet approved or cleared by the [place]  FDA and has been authorized for detection and/or diagnosis of SARS-CoV-2 by FDA under an Emergency Use Authorization (EUA). This EUA will remain in effect (meaning this test can be used) for the duration of the COVID-19 declaration under Section 564(b)(1) of the Act, 21 U.S.C. section 360bbb-3(b)(1), unless the authorization is terminated or revoked.  Performed at [facility], [address] [place], [place] [address]   Respiratory (~20 pathogens) panel by PCR     Status: None   Collection Time: 08/13/24  7:51 AM   Specimen: Nasopharyngeal Swab; Respiratory  Result Value Ref Range Status   Adenovirus NOT DETECTED NOT DETECTED Final   Coronavirus 229E NOT DETECTED NOT DETECTED Final    Comment: (NOTE) The Coronavirus on the Respiratory Panel, DOES NOT test for the novel  Coronavirus (2019 nCoV)     Coronavirus HKU1 NOT DETECTED NOT DETECTED Final   Coronavirus NL63 NOT DETECTED NOT DETECTED Final   Coronavirus OC43 NOT DETECTED NOT DETECTED Final   Metapneumovirus NOT DETECTED NOT DETECTED Final   Rhinovirus / Enterovirus NOT DETECTED NOT DETECTED Final   Influenza A NOT DETECTED NOT DETECTED Final   Influenza B NOT DETECTED NOT DETECTED Final   Parainfluenza Virus 1 NOT DETECTED NOT DETECTED Final   Parainfluenza Virus 2 NOT DETECTED NOT DETECTED Final   Parainfluenza Virus 3 NOT DETECTED NOT DETECTED Final  Parainfluenza Virus 4 NOT DETECTED NOT DETECTED Final   Respiratory Syncytial Virus NOT DETECTED NOT DETECTED Final   Bordetella pertussis NOT DETECTED NOT DETECTED Final   Bordetella Parapertussis NOT DETECTED NOT DETECTED Final   Chlamydophila pneumoniae NOT DETECTED NOT DETECTED Final   Mycoplasma pneumoniae NOT DETECTED NOT DETECTED Final    Comment: Performed at [facility], [address] [place], [place] [address]    Labs: CBC: Recent Labs  Lab 09/20/24 0417 09/21/24 0430 09/22/24 0555 09/23/24 1100 09/23/24 2025 09/24/24 0601  WBC 10.9* 12.5* 10.9* 11.7*  --  12.3*  HGB 7.3* 7.9* 7.2* 6.9* 7.8* 7.7*  HCT 24.4* 26.4* 23.8* 23.2* 25.3* 25.0*  MCV 92.1 93.0 92.6 92.8  --  92.3  PLT 363 446* 399 373  --  374   Basic Metabolic Panel: Recent Labs  Lab 09/20/24 0417 09/21/24 0430 09/22/24 0555 09/23/24 1100 09/24/24 0601  NA 136 137 138 136 138  K 3.9 4.0 4.0 4.2 4.1  CL 90* 91* 88* 90* 90*  CO2 35* 34* 32 33* 37*  GLUCOSE 108* 120* 151* 216* 124*  BUN 23 23 29* 32* 39*  CREATININE 1.33* 1.34* 1.76* 1.54* 1.64*  CALCIUM  8.2* 8.5* 8.8* 8.6* 8.8*   Liver Function Tests: No results for input(s): AST, ALT, ALKPHOS, BILITOT, PROT, ALBUMIN  in the last 168 hours. CBG: No results for input(s): GLUCAP in the last 168 hours.  Discharge time spent:  37 minutes.  Signed: Drue ONEIDA Potter, MD [facility] 09/24/2024

## 2024-09-29 ENCOUNTER — Emergency Department
Admission: EM | Admit: 2024-09-29 | Discharge: 2024-09-29 | Disposition: A | Discharge status: Home / Self Care | Attending: Emergency Medicine | Admitting: Emergency Medicine

## 2024-09-29 ENCOUNTER — Emergency Department

## 2024-09-29 ENCOUNTER — Other Ambulatory Visit: Payer: Self-pay

## 2024-09-29 ENCOUNTER — Encounter: Payer: Self-pay | Admitting: Intensive Care

## 2024-09-29 DIAGNOSIS — Z8673 Personal history of transient ischemic attack (TIA), and cerebral infarction without residual deficits: Secondary | ICD-10-CM | POA: Diagnosis not present

## 2024-09-29 DIAGNOSIS — R7989 Other specified abnormal findings of blood chemistry: Secondary | ICD-10-CM | POA: Insufficient documentation

## 2024-09-29 DIAGNOSIS — I110 Hypertensive heart disease with heart failure: Secondary | ICD-10-CM | POA: Diagnosis not present

## 2024-09-29 DIAGNOSIS — I509 Heart failure, unspecified: Secondary | ICD-10-CM | POA: Diagnosis not present

## 2024-09-29 DIAGNOSIS — J449 Chronic obstructive pulmonary disease, unspecified: Secondary | ICD-10-CM | POA: Insufficient documentation

## 2024-09-29 DIAGNOSIS — R0600 Dyspnea, unspecified: Secondary | ICD-10-CM

## 2024-09-29 DIAGNOSIS — R0602 Shortness of breath: Secondary | ICD-10-CM | POA: Diagnosis present

## 2024-09-29 LAB — COMPREHENSIVE METABOLIC PANEL WITH GFR
ALT: 11 U/L (ref 0–44)
AST: 35 U/L (ref 15–41)
Albumin: 2.8 g/dL — ABNORMAL LOW (ref 3.5–5.0)
Alkaline Phosphatase: 48 U/L (ref 38–126)
Anion gap: 12 (ref 5–15)
BUN: 21 mg/dL (ref 8–23)
CO2: 29 mmol/L (ref 22–32)
Calcium: 7.7 mg/dL — ABNORMAL LOW (ref 8.9–10.3)
Chloride: 98 mmol/L (ref 98–111)
Creatinine, Ser: 1.45 mg/dL — ABNORMAL HIGH (ref 0.61–1.24)
GFR, Estimated: 53 mL/min — ABNORMAL LOW (ref >60)
Glucose, Bld: 125 mg/dL — ABNORMAL HIGH (ref 70–99)
Potassium: 3.5 mmol/L (ref 3.5–5.1)
Sodium: 139 mmol/L (ref 135–145)
Total Bilirubin: 1.3 mg/dL — ABNORMAL HIGH (ref 0.0–1.2)
Total Protein: 7.2 g/dL (ref 6.5–8.1)

## 2024-09-29 LAB — CBC
HCT: 28.8 % — ABNORMAL LOW (ref 39.0–52.0)
Hemoglobin: 8.1 g/dL — ABNORMAL LOW (ref 13.0–17.0)
MCH: 27.6 pg (ref 26.0–34.0)
MCHC: 28.1 g/dL — ABNORMAL LOW (ref 30.0–36.0)
MCV: 98 fL (ref 80.0–100.0)
Platelets: 305 K/uL (ref 150–400)
RBC: 2.94 MIL/uL — ABNORMAL LOW (ref 4.22–5.81)
RDW: 18 % — ABNORMAL HIGH (ref 11.5–15.5)
WBC: 9.4 K/uL (ref 4.0–10.5)
nRBC: 0 % (ref 0.0–0.2)

## 2024-09-29 LAB — BRAIN NATRIURETIC PEPTIDE: B Natriuretic Peptide: 1168.9 pg/mL — ABNORMAL HIGH (ref 0.0–100.0)

## 2024-09-29 LAB — LIPASE, BLOOD: Lipase: 28 U/L (ref 11–51)

## 2024-09-29 LAB — TROPONIN I (HIGH SENSITIVITY)
Troponin I (High Sensitivity): 13 ng/L (ref <18)
Troponin I (High Sensitivity): 12 ng/L (ref <18)

## 2024-09-29 MED ORDER — FUROSEMIDE 10 MG/ML IJ SOLN
60.0000 mg | Freq: Once | INTRAMUSCULAR | Status: AC
  Administered 2024-09-29: 60 mg via INTRAVENOUS
  Filled 2024-09-29: qty 8

## 2024-09-29 MED ORDER — IPRATROPIUM-ALBUTEROL 0.5-2.5 (3) MG/3ML IN SOLN
3.0000 mL | Freq: Once | RESPIRATORY_TRACT | Status: AC
  Administered 2024-09-29: 3 mL via RESPIRATORY_TRACT
  Filled 2024-09-29: qty 3

## 2024-09-29 NOTE — Discharge Instructions (Signed)
 Please continue to take torsemide  at home as prescribed by your doctor.  Please follow-up with your doctor in the next several days for recheck/reevaluation.  Return to the emergency department for any worsening shortness of breath or any other symptom personally concerning to yourself.

## 2024-09-29 NOTE — ED Triage Notes (Signed)
 Arrived by [facility] From home. C/o abdominal pain and sob. Abdomen appears distended. Also c/o chest pain. A&O x4 upon arrival   History GI bleed, CHF and COPD  EMS reports 88% RA standing. 82HR 141/80 b/p 146CBG EMS reports wheezing

## 2024-09-29 NOTE — ED Notes (Signed)
 Called [facility] for transport home  will be here to pick up patient 4 ahead 1517

## 2024-09-29 NOTE — ED Provider Notes (Signed)
 [facility] Provider Note    Event Date/Time   First MD Initiated Contact with Patient 09/29/24 1111     (approximate)  History   Chief Complaint: Abdominal Pain and Shortness of Breath  HPI  Hilman Kissling is a 63 y.o. male with a past medical history of COPD, prior CVA, hypertension, CHF, presents to the emergency department for worsening shortness of breath.  According to the patient since being released from the hospital he feels like his shortness of breath has worsened.  Per record review patient was discharged 09/24/2024 after a CHF exacerbation admission.  Patient states his lower extremities have become more swollen.  States at times he is having some abdominal discomfort which he relates to the swelling of his abdomen.  Denies any nausea vomiting or diarrhea.  No fever.  No cough.  States shortness of breath is much worse with any exertion.  Patient is prescribed 2 L of oxygen chronically at home.  Currently satting 100% on 2 L.  Physical Exam   Triage Vital Signs: ED Triage Vitals  Encounter Vitals Group     BP 09/29/24 1115 118/73     Girls Systolic BP Percentile --      Girls Diastolic BP Percentile --      Boys Systolic BP Percentile --      Boys Diastolic BP Percentile --      Pulse Rate 09/29/24 1115 80     Resp 09/29/24 1115 16     Temp 09/29/24 1115 98 F (36.7 C)     Temp Source 09/29/24 1115 Oral     SpO2 09/29/24 1115 100 %     Weight 09/29/24 1113 225 lb 8.5 oz (102.3 kg)     Height 09/29/24 1113 6' (1.829 m)     Head Circumference --      Peak Flow --      Pain Score 09/29/24 1112 5     Pain Loc --      Pain Education --      Exclude from Growth Chart --     Most recent vital signs: Vitals:   09/29/24 1115  BP: 118/73  Pulse: 80  Resp: 16  Temp: 98 F (36.7 C)  SpO2: 100%    General: Awake, no distress.  CV:  Good peripheral perfusion.  Regular rate and rhythm 2/6 systolic murmur. Resp:  Normal effort.  Equal  breath sounds bilaterally.  Abd:  No distention.  Soft, nontender.  No rebound or guarding.  Benign abdomen. Other:  2+ lower extremity edema   ED Results / Procedures / Treatments   EKG  EKG viewed and interpreted by myself shows a normal sinus rhythm at 80 bpm with a slightly widened QRS, normal axis, slight QTc prolongation otherwise normal intervals with nonspecific ST changes.  RADIOLOGY  I have reviewed the chest x-ray images.  No obvious consolidation on my evaluation. Radiology has read the x-ray as cardiomegaly with vascular congestion.  No overt edema.   MEDICATIONS ORDERED IN ED: Medications - No data to display   IMPRESSION / MDM / ASSESSMENT AND PLAN / ED COURSE  I reviewed the triage vital signs and the nursing notes.  Patient's presentation is most consistent with acute presentation with potential threat to life or bodily function.  Patient presents emergency department for shortness of breath which he states has been worsening since being discharged from the hospital 09/24/2024.  Patient was admitted for CHF exacerbation at that time.  Patient does have 2+ lower extremity edema which he states has increased since going home from the hospital.  Patient has mild tachypnea during my evaluation around 20 breaths/min but no obvious wheeze rales or rhonchi.  Given the patient's symptoms we will check labs including BNP and troponin.  Will obtain a chest x-ray and continue to closely monitor.  Currently satting well on 2 L which is his baseline O2 requirement.  Triage note does state abdominal pain as well.  Patient clarified he states his abdomen feels like there is fluid and hurts him at times if he moves around.  He has a benign abdominal exam during my evaluation.  Patient's workup is overall reassuring baseline CBC with baseline anemia in fact somewhat improved from recent levels.  Patient's troponin is negative.  Patient's chemistry is reassuring.  Lipase is normal.  BNP is  elevated to 1100 however this is decreased from recent historical values.  Chest x-ray shows cardiomegaly with vascular congestion but no overt edema.  Patient has a normal respiratory rate speaking in full sentences normal pulse rate satting 100% on 2 L which is his baseline O2 requirement.  Given the patient's reassuring workup I believe the patient would be safe for discharge home with outpatient follow-up.  Given his lower extremity edema which he states is somewhat worse currently 1-2+ we will dose 60 mg of IV Lasix .  Patient will continue taking oral Lasix  at home.  Patient agreeable to plan of care and workup.  FINAL CLINICAL IMPRESSION(S) / ED DIAGNOSES   CHF exacerbation Dyspnea  Note:  This document was prepared using Dragon voice recognition software and may include unintentional dictation errors.   Dorothyann Drivers, MD 09/29/24 1429

## 2024-10-02 ENCOUNTER — Other Ambulatory Visit: Payer: Self-pay

## 2024-10-17 ENCOUNTER — Encounter (INDEPENDENT_AMBULATORY_CARE_PROVIDER_SITE_OTHER): Payer: Self-pay | Admitting: Vascular Surgery

## 2024-10-17 ENCOUNTER — Ambulatory Visit (INDEPENDENT_AMBULATORY_CARE_PROVIDER_SITE_OTHER): Admitting: Vascular Surgery

## 2024-10-17 VITALS — BP 90/70 | HR 96 | Resp 18 | Ht 72.0 in

## 2024-10-17 DIAGNOSIS — J449 Chronic obstructive pulmonary disease, unspecified: Secondary | ICD-10-CM

## 2024-10-17 DIAGNOSIS — N1831 Chronic kidney disease, stage 3a: Secondary | ICD-10-CM

## 2024-10-17 DIAGNOSIS — I70219 Atherosclerosis of native arteries of extremities with intermittent claudication, unspecified extremity: Secondary | ICD-10-CM

## 2024-10-17 DIAGNOSIS — E785 Hyperlipidemia, unspecified: Secondary | ICD-10-CM

## 2024-10-17 DIAGNOSIS — I10 Essential (primary) hypertension: Secondary | ICD-10-CM | POA: Diagnosis not present

## 2024-10-17 DIAGNOSIS — Q254 Congenital malformation of aorta unspecified: Secondary | ICD-10-CM

## 2024-10-17 NOTE — Progress Notes (Signed)
 [image]   MRN : [id number]  [name] is a 63 y.o. ([birth year]) male who presents with chief complaint of  Chief Complaint Patient presents with  Follow-up   Hospital follow up PAD CTA done 08/21/24 per FB .  History of Present Illness: Patient returns today in follow up of his peripheral arterial disease.  We have followed him for this previously and then saw him as a consult in the hospital about 2 months ago.  He had a CT angiogram of the chest abdomen pelvis.  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  He has fairly significant iliac artery calcific disease with some degree of stenosis.  He also appears to have fairly significant SFA disease.  He has had known PAD for some years and we have followed him and his ABIs have been surprisingly well-preserved.  He does not have any ulceration.  He has no rest pain.  It is difficult to discern how much claudication he has because his lungs are so poor he can only walk short distances before having significant shortness of breath.  He does not have any open wounds.  He had marked swelling in his legs largely due to cardiopulmonary issues when he was in the hospital but this has significantly improved and his swelling is fairly mild at this point.  Current Outpatient Medications Medication Sig Dispense Refill  albuterol  (VENTOLIN  HFA) 108 (90 Base) MCG/ACT inhaler Inhale 2 puffs into the lungs every 6 (six) hours as needed for wheezing or shortness of breath. 13.4 g 0  amiodarone  (PACERONE ) 200 MG tablet Take 1 tablet (200 mg total) by mouth daily.    ascorbic acid  (VITAMIN C ) 500 MG tablet Take 1 tablet (500 mg total) by mouth daily. 30 tablet 0  aspirin  EC 81 MG tablet Take 1 tablet (81 mg total) by mouth daily. Swallow whole.    clopidogrel  (PLAVIX ) 75 MG tablet Take 1 tablet (75 mg total) by mouth daily with breakfast. 30 tablet 0  dapagliflozin  propanediol (FARXIGA ) 10 MG TABS tablet Take  1 tablet (10 mg total) by mouth daily. 30 tablet 0  dextromethorphan -guaiFENesin  (MUCINEX  DM) 30-600 MG 12hr tablet Take 1 tablet by mouth 2 (two) times daily as needed for cough. 30 tablet 0  Fe Fum-Vit C-Vit B12-FA (TRIGELS-F FORTE) CAPS capsule Take 1 capsule by mouth 2 (two) times daily. 60 capsule 1  Fluticasone -Umeclidin-Vilant (TRELEGY ELLIPTA ) 200-62.5-25 MCG/ACT AEPB Inhale 1 Inhalation into the lungs daily at 12 noon. 60 each 0  folic acid  (KP FOLIC ACID ) 1 MG tablet Take 1 mg by mouth daily.    gabapentin  (NEURONTIN ) 300 MG capsule Take 1 capsule (300 mg total) by mouth 2 (two) times daily. 60 capsule 0  isosorbide  mononitrate (IMDUR ) 30 MG 24 hr tablet Take 1 tablet (30 mg total) by mouth daily. 30 tablet 1  lactulose  (CHRONULAC ) 10 GM/15ML solution Take 30 mLs (20 g total) by mouth daily as needed for moderate constipation or mild constipation. 946 mL 0  melatonin 5 MG TABS Place 1 tablet (5 mg total) into feeding tube at bedtime as needed (sleep). 30 tablet 0  metoprolol  succinate (TOPROL -XL) 25 MG 24 hr tablet Take 1 tablet (25 mg total) by mouth daily. 30 tablet 1  Multiple Vitamin (MULTIVITAMIN WITH MINERALS) TABS tablet Take 1 tablet by mouth daily. 30 tablet 0  nicotine  (NICODERM CQ  - DOSED IN MG/24 HR) 7 mg/24hr patch One 7mg  patch chest wall daily 28 patch 0  pantoprazole  (PROTONIX ) 40 MG tablet Take 1 tablet (40 mg total) by mouth daily. 30 tablet 1  polyethylene glycol powder (GLYCOLAX /MIRALAX ) 17 GM/SCOOP powder Take 17 g by mouth at bedtime. Mix as directed. 510 g 0  rosuvastatin  (CRESTOR ) 20 MG tablet Take 1 tablet (20 mg total) by mouth daily. 30 tablet 0  thiamine  (VITAMIN B-1) 100 MG tablet Take 1 tablet (100 mg total) by mouth daily. 30 tablet 0  torsemide  (DEMADEX ) 20 MG tablet Take 1 tablet (20 mg total) by mouth daily. 30 tablet 1  TRUE VITAMIN D3 10 MCG (400 UNIT) CAPS Take 2 capsules by mouth daily.    Vitamin D , Ergocalciferol , (DRISDOL ) 1.25 MG (50000 UNIT) CAPS  capsule Take 1 capsule (50,000 Units total) by mouth every 7 (seven) days. 4 capsule 0  No current facility-administered medications for this visit.   Past Medical History: Diagnosis Date  COPD (chronic obstructive pulmonary disease) (HCC)   CVA (cerebral infarction)   Headache   mild, since stroke 2012  Hypertension   MI (myocardial infarction) (HCC) 05/09/2012  Reading difficulty   pt reports he reads at about a second grade level  Stroke [facility] 2012  numbness to left hand   Wears dentures   full upper and lower   Past Surgical History: Procedure Laterality Date  CAROTID ENDARTERECTOMY Left   2012 or 2013  CHOLECYSTECTOMY N/A 07/26/2016  Procedure: LAPAROSCOPIC CHOLECYSTECTOMY;  Surgeon: Charlie FORBES Fell, MD;  Location: [facility] ORS;  Service: General;  Laterality: N/A;  COLONOSCOPY N/A 05/08/2024  Procedure: COLONOSCOPY;  Surgeon: Jinny Carmine, MD;  Location: [facility] ENDOSCOPY;  Service: Endoscopy;  Laterality: N/A;  COLONOSCOPY WITH PROPOFOL  N/A 02/09/2022  Procedure: COLONOSCOPY WITH PROPOFOL ;  Surgeon: Jinny Carmine, MD;  Location: [facility] ENDOSCOPY;  Service: Endoscopy;  Laterality: N/A;  CORONARY ARTERY BYPASS GRAFT  05/10/2012  3 vessel  CORONARY STENT INTERVENTION N/A 07/02/2024  Procedure: CORONARY STENT INTERVENTION;  Surgeon: Katina Albright, MD;  Location: [facility] INVASIVE CV LAB;  Service: Cardiovascular;  Laterality: N/A;  CORONARY ULTRASOUND/IVUS N/A 07/02/2024  Procedure: Coronary Ultrasound/IVUS;  Surgeon: Katina Albright, MD;  Location: [facility] INVASIVE CV LAB;  Service: Cardiovascular;  Laterality: N/A;  ENTEROSCOPY N/A 05/16/2023  Procedure: ENTEROSCOPY;  Surgeon: Therisa Bi, MD;  Location: [facility] ENDOSCOPY;  Service: Gastroenterology;  Laterality: N/A;  ENTEROSCOPY N/A 06/01/2023  Procedure: ENTEROSCOPY;  Surgeon: Unk Corinn Skiff, MD;  Location: [facility] ENDOSCOPY;  Service: Gastroenterology;  Laterality: N/A;  ESOPHAGOGASTRODUODENOSCOPY  05/13/2023  Procedure:  ESOPHAGOGASTRODUODENOSCOPY (EGD);  Surgeon: Jinny Carmine, MD;  Location: [facility] ENDOSCOPY;  Service: Endoscopy;;  ESOPHAGOGASTRODUODENOSCOPY (EGD) WITH PROPOFOL  N/A 10/28/2022  Procedure: ESOPHAGOGASTRODUODENOSCOPY (EGD) WITH PROPOFOL ;  Surgeon: Onita Elspeth Sharper, DO;  Location: [facility] ENDOSCOPY;  Service: Gastroenterology;  Laterality: N/A;  GIVENS CAPSULE STUDY  05/13/2023  Procedure: GIVENS CAPSULE STUDY;  Surgeon: Jinny Carmine, MD;  Location: [facility] ENDOSCOPY;  Service: Endoscopy;;  GIVENS CAPSULE STUDY  06/01/2023  Procedure: GIVENS CAPSULE STUDY;  Surgeon: Unk Corinn Skiff, MD;  Location: [facility] ENDOSCOPY;  Service: Gastroenterology;;  LEFT HEART CATH AND CORS/GRAFTS ANGIOGRAPHY N/A 07/02/2024  Procedure: LEFT HEART CATH AND CORS/GRAFTS ANGIOGRAPHY;  Surgeon: Katina Albright, MD;  Location: [facility] INVASIVE CV LAB;  Service: Cardiovascular;  Laterality: N/A;    Social History  Tobacco Use  Smoking status: Former   Current packs/day: 0.00   Types: Cigarettes   Quit date: 1993   Years since quitting: 32.8  Smokeless tobacco: Current   Types: Snuff  Tobacco comments:   changed to dip Vaping Use  Vaping status: Never Used  Substance Use Topics  Alcohol  use: Not Currently   Alcohol /week: 26.0 standard drinks of alcohol    Types: 26 Cans of beer per week  Drug use: No     Family History Problem Relation Age of Onset  Pneumonia Mother  No bleeding or clotting disorders  Allergies Allergen Reactions  Wellbutrin  [Bupropion ]    Paranoia       REVIEW OF SYSTEMS (Negative unless checked)   Constitutional: [] Weight loss  [] Fever  [] Chills Cardiac: [] Chest pain   [] Chest pressure   [] Palpitations   [] Shortness of breath when laying flat   [] Shortness of breath at rest   [] Shortness of breath with exertion. Vascular:  [x] Pain in legs with walking   [] Pain in legs at rest   [] Pain in legs when laying flat   [x] Claudication   [] Pain in feet when walking  [] Pain in feet at rest  [] Pain in  feet when laying flat   [] History of DVT   [] Phlebitis   [] Swelling in legs   [] Varicose veins   [] Non-healing ulcers Pulmonary:   [] Uses home oxygen   [] Productive cough   [] Hemoptysis   [] Wheeze  [x] COPD   [] Asthma Neurologic:  [] Dizziness  [] Blackouts   [] Seizures   [x] History of stroke   [] History of TIA  [] Aphasia   [] Temporary blindness   [] Dysphagia   [] Weakness or numbness in arms   [] Weakness or numbness in legs Musculoskeletal:  [] Arthritis   [] Joint swelling   [x] Joint pain   [] Low back pain Hematologic:  [] Easy bruising  [] Easy bleeding   [] Hypercoagulable state   [] Anemic  [] Hepatitis Gastrointestinal:  [] Blood in stool   [] Vomiting blood  [] Gastroesophageal reflux/heartburn   [] Abdominal pain Genitourinary:  [] Chronic kidney disease   [] Difficult urination  [] Frequent urination  [] Burning with urination   [] Hematuria Skin:  [] Rashes   [] Ulcers   [] Wounds Psychological:  [] History of anxiety   []  History of major depression.   Physical Examination  BP 90/70 (BP Location: Left Arm, Patient Position: Sitting, Cuff Size: Normal)   Pulse 96   Resp 18   Ht 6' (1.829 m)   BMI 30.59 kg/m  Gen:  WD/WN, NAD. Appears older than stated age. Head: [place]/AT, No temporalis wasting. Ear/Nose/Throat: Hearing grossly intact, nares w/o erythema or drainage Eyes: Conjunctiva clear. Sclera non-icteric Neck: Supple.  Trachea midline Pulmonary:  Good air movement, no use of accessory muscles on supplemental oxygen.  Cardiac: RRR, no JVD Vascular:  Vessel Right Left Radial Palpable Palpable                   PT 1+ Palpable 1+ Palpable DP 1+ Palpable Not Palpable  Gastrointestinal: soft, non-tender/non-distended. No guarding/reflex.  Musculoskeletal: M/S 5/5 throughout.  No deformity or atrophy. Mild LLE edema. Neurologic: Sensation grossly intact in extremities.  Symmetrical.  Speech is fluent.  Psychiatric: Judgment intact, Mood & affect appropriate for pt's clinical  situation. Dermatologic: No rashes or ulcers noted.  No cellulitis or open wounds.      Labs Recent Results (from the past 2160 hours) Glucose, capillary     Status: Abnormal  Collection Time: 07/19/24 11:52 AM Result Value Ref Range  Glucose-Capillary 122 (H) 70 - 99 mg/dL   Comment: Glucose reference range applies only to samples taken after fasting for at least 8 hours.  Comment 1 Notify RN  Glucose, capillary     Status: Abnormal  Collection Time: 07/19/24  4:25 PM Result Value Ref Range  Glucose-Capillary 154 (H) 70 - 99 mg/dL  Comment: Glucose reference range applies only to samples taken after fasting for at least 8 hours.  Comment 1 Notify RN  Glucose, capillary     Status: Abnormal  Collection Time: 07/19/24  7:43 PM Result Value Ref Range  Glucose-Capillary 141 (H) 70 - 99 mg/dL   Comment: Glucose reference range applies only to samples taken after fasting for at least 8 hours. Glucose, capillary     Status: Abnormal  Collection Time: 07/19/24 11:40 PM Result Value Ref Range  Glucose-Capillary 137 (H) 70 - 99 mg/dL   Comment: Glucose reference range applies only to samples taken after fasting for at least 8 hours. Glucose, capillary     Status: Abnormal  Collection Time: 07/20/24  3:05 AM Result Value Ref Range  Glucose-Capillary 159 (H) 70 - 99 mg/dL   Comment: Glucose reference range applies only to samples taken after fasting for at least 8 hours. CBC     Status: Abnormal  Collection Time: 07/20/24  5:31 AM Result Value Ref Range  WBC 12.8 (H) 4.0 - 10.5 K/uL  RBC 3.40 (L) 4.22 - 5.81 MIL/uL  Hemoglobin 9.2 (L) 13.0 - 17.0 g/dL  HCT 68.3 (L) 60.9 - 47.9 %  MCV 92.9 80.0 - 100.0 fL  MCH 27.1 26.0 - 34.0 pg  MCHC 29.1 (L) 30.0 - 36.0 g/dL  RDW 83.4 (H) 88.4 - 84.4 %  Platelets 229 150 - 400 K/uL  nRBC 0.0 0.0 - 0.2 %   Comment: Performed at [facility], [address], [place], [place] [address] Renal function panel     Status:  Abnormal  Collection Time: 07/20/24  5:31 AM Result Value Ref Range  Sodium 147 (H) 135 - 145 mmol/L  Potassium 4.4 3.5 - 5.1 mmol/L  Chloride 109 98 - 111 mmol/L  CO2 29 22 - 32 mmol/L  Glucose, Bld 178 (H) 70 - 99 mg/dL   Comment: Glucose reference range applies only to samples taken after fasting for at least 8 hours.  BUN 27 (H) 8 - 23 mg/dL  Creatinine, Ser 8.98 9.38 - 1.24 mg/dL  Calcium  8.6 (L) 8.9 - 10.3 mg/dL  Phosphorus 3.1 2.5 - 4.6 mg/dL  Albumin  3.0 (L) 3.5 - 5.0 g/dL  GFR, Estimated >39 >39 mL/min   Comment: (NOTE) Calculated using the CKD-EPI Creatinine Equation (2021)   Anion gap 9 5 - 15   Comment: Performed at [facility], [address] [place], [place] [address] Glucose, capillary     Status: Abnormal  Collection Time: 07/20/24  7:46 AM Result Value Ref Range  Glucose-Capillary 150 (H) 70 - 99 mg/dL   Comment: Glucose reference range applies only to samples taken after fasting for at least 8 hours. Glucose, capillary     Status: Abnormal  Collection Time: 07/20/24 11:14 AM Result Value Ref Range  Glucose-Capillary 158 (H) 70 - 99 mg/dL   Comment: Glucose reference range applies only to samples taken after fasting for at least 8 hours. Glucose, capillary     Status: Abnormal  Collection Time: 07/20/24  4:44 PM Result Value Ref Range  Glucose-Capillary 135 (H) 70 - 99 mg/dL   Comment: Glucose reference range applies only to samples taken after fasting for at least 8 hours. Glucose, capillary     Status: Abnormal  Collection Time: 07/20/24  7:37 PM Result Value Ref Range  Glucose-Capillary 141 (H) 70 - 99 mg/dL   Comment: Glucose reference range applies only to samples taken after fasting for at least 8 hours.  Glucose, capillary     Status: Abnormal  Collection Time: 07/20/24 11:51 PM Result Value Ref Range  Glucose-Capillary 188 (H) 70 - 99 mg/dL   Comment: Glucose reference range applies only to samples taken after fasting for at least 8  hours. Glucose, capillary     Status: Abnormal  Collection Time: 07/21/24  4:00 AM Result Value Ref Range  Glucose-Capillary 176 (H) 70 - 99 mg/dL   Comment: Glucose reference range applies only to samples taken after fasting for at least 8 hours. CBC     Status: Abnormal  Collection Time: 07/21/24  4:55 AM Result Value Ref Range  WBC 10.5 4.0 - 10.5 K/uL  RBC 2.91 (L) 4.22 - 5.81 MIL/uL  Hemoglobin 8.0 (L) 13.0 - 17.0 g/dL  HCT 73.0 (L) 60.9 - 47.9 %  MCV 92.4 80.0 - 100.0 fL  MCH 27.5 26.0 - 34.0 pg  MCHC 29.7 (L) 30.0 - 36.0 g/dL  RDW 83.1 (H) 88.4 - 84.4 %  Platelets 211 150 - 400 K/uL  nRBC 0.0 0.0 - 0.2 %   Comment: Performed at [facility], [address], [place], [place] [address] Renal function panel     Status: Abnormal  Collection Time: 07/21/24  4:55 AM Result Value Ref Range  Sodium 145 135 - 145 mmol/L  Potassium 4.0 3.5 - 5.1 mmol/L  Chloride 105 98 - 111 mmol/L  CO2 33 (H) 22 - 32 mmol/L  Glucose, Bld 193 (H) 70 - 99 mg/dL   Comment: Glucose reference range applies only to samples taken after fasting for at least 8 hours.  BUN 33 (H) 8 - 23 mg/dL  Creatinine, Ser 8.84 9.38 - 1.24 mg/dL  Calcium  8.4 (L) 8.9 - 10.3 mg/dL  Phosphorus 3.4 2.5 - 4.6 mg/dL  Albumin  2.7 (L) 3.5 - 5.0 g/dL  GFR, Estimated >39 >39 mL/min   Comment: (NOTE) Calculated using the CKD-EPI Creatinine Equation (2021)   Anion gap 7 5 - 15   Comment: Performed at [facility], [address] [place], [place] [address] Glucose, capillary     Status: Abnormal  Collection Time: 07/21/24  7:39 AM Result Value Ref Range  Glucose-Capillary 163 (H) 70 - 99 mg/dL   Comment: Glucose reference range applies only to samples taken after fasting for at least 8 hours. Glucose, capillary     Status: Abnormal  Collection Time: 07/21/24 12:02 PM Result Value Ref Range  Glucose-Capillary 169 (H) 70 - 99 mg/dL   Comment: Glucose reference range applies only to samples taken after  fasting for at least 8 hours. Prepare RBC (crossmatch)     Status: None  Collection Time: 07/21/24 12:04 PM Result Value Ref Range  Order Confirmation     ORDER PROCESSED BY BLOOD BANK Performed at [facility], [address] [place], [place] [address]  Type and screen [facility]     Status: None  Collection Time: 07/21/24 12:36 PM Result Value Ref Range  ABO/RH(D) A POS   Antibody Screen NEG   Sample Expiration 07/24/2024,2359   Unit Number [id number]   Blood Component Type RED CELLS,LR   Unit division 00   Status of Unit ISSUED,FINAL   Transfusion Status OK TO TRANSFUSE   Crossmatch Result     Compatible Performed at [facility], [address], [place], [place] [address]  BPAM RBC     Status: None  Collection Time: 07/21/24 12:36 PM Result Value Ref Range  ISSUE DATE / TIME [id number]  Blood Product Unit Number [id number]   PRODUCT CODE [id number]   Unit Type and Rh 6200   Blood Product Expiration Date [id number]  Glucose, capillary     Status: Abnormal  Collection Time: 07/21/24  3:26 PM Result Value Ref Range  Glucose-Capillary 156 (H) 70 - 99 mg/dL   Comment: Glucose reference range applies only to samples taken after fasting for at least 8 hours. Glucose, capillary     Status: Abnormal  Collection Time: 07/21/24  7:43 PM Result Value Ref Range  Glucose-Capillary 134 (H) 70 - 99 mg/dL   Comment: Glucose reference range applies only to samples taken after fasting for at least 8 hours. Glucose, capillary     Status: Abnormal  Collection Time: 07/21/24 10:45 PM Result Value Ref Range  Glucose-Capillary 158 (H) 70 - 99 mg/dL   Comment: Glucose reference range applies only to samples taken after fasting for at least 8 hours. Glucose, capillary     Status: Abnormal  Collection Time: 07/22/24  3:54 AM Result Value Ref Range  Glucose-Capillary 163 (H) 70 - 99 mg/dL   Comment: Glucose reference range applies only to  samples taken after fasting for at least 8 hours. Basic metabolic panel with GFR     Status: Abnormal  Collection Time: 07/22/24  4:16 AM Result Value Ref Range  Sodium 146 (H) 135 - 145 mmol/L  Potassium 3.9 3.5 - 5.1 mmol/L  Chloride 104 98 - 111 mmol/L  CO2 33 (H) 22 - 32 mmol/L  Glucose, Bld 186 (H) 70 - 99 mg/dL   Comment: Glucose reference range applies only to samples taken after fasting for at least 8 hours.  BUN 36 (H) 8 - 23 mg/dL  Creatinine, Ser 9.02 9.38 - 1.24 mg/dL  Calcium  8.6 (L) 8.9 - 10.3 mg/dL  GFR, Estimated >39 >39 mL/min   Comment: (NOTE) Calculated using the CKD-EPI Creatinine Equation (2021)   Anion gap 9 5 - 15   Comment: Performed at [facility] [address] [place], [place] [address] CBC     Status: Abnormal  Collection Time: 07/22/24  4:16 AM Result Value Ref Range  WBC 14.0 (H) 4.0 - 10.5 K/uL  RBC 3.09 (L) 4.22 - 5.81 MIL/uL  Hemoglobin 8.6 (L) 13.0 - 17.0 g/dL  HCT 71.6 (L) 60.9 - 47.9 %  MCV 91.6 80.0 - 100.0 fL  MCH 27.8 26.0 - 34.0 pg  MCHC 30.4 30.0 - 36.0 g/dL  RDW 83.1 (H) 88.4 - 84.4 %  Platelets 247 150 - 400 K/uL  nRBC 0.0 0.0 - 0.2 %   Comment: Performed at [facility], [address] [place], [place] [address] Glucose, capillary     Status: Abnormal  Collection Time: 07/22/24  7:20 AM Result Value Ref Range  Glucose-Capillary 162 (H) 70 - 99 mg/dL   Comment: Glucose reference range applies only to samples taken after fasting for at least 8 hours. Glucose, capillary     Status: Abnormal  Collection Time: 07/22/24 11:32 AM Result Value Ref Range  Glucose-Capillary 124 (H) 70 - 99 mg/dL   Comment: Glucose reference range applies only to samples taken after fasting for at least 8 hours. Glucose, capillary     Status: Abnormal  Collection Time: 07/22/24  3:13 PM Result Value Ref Range  Glucose-Capillary 123 (H) 70 - 99 mg/dL   Comment: Glucose reference range applies only to samples taken after fasting for  at least 8 hours. Glucose, capillary     Status: Abnormal  Collection Time: 07/22/24  7:37 PM Result Value Ref Range  Glucose-Capillary 134 (H) 70 - 99 mg/dL   Comment: Glucose reference range applies only to samples taken after fasting for at least 8 hours. Glucose, capillary     Status: Abnormal  Collection Time: 07/22/24 11:12 PM Result Value Ref Range  Glucose-Capillary 134 (H) 70 - 99 mg/dL   Comment: Glucose reference range applies only to samples taken after fasting for at least 8 hours. Glucose, capillary     Status: Abnormal  Collection Time: 07/23/24  3:18 AM Result Value Ref Range  Glucose-Capillary 118 (H) 70 - 99 mg/dL   Comment: Glucose reference range applies only to samples taken after fasting for at least 8 hours. Basic metabolic panel with GFR     Status: Abnormal  Collection Time: 07/23/24  4:57 AM Result Value Ref Range  Sodium 142 135 - 145 mmol/L  Potassium 4.1 3.5 - 5.1 mmol/L  Chloride 101 98 - 111 mmol/L  CO2 32 22 - 32 mmol/L  Glucose, Bld 138 (H) 70 - 99 mg/dL   Comment: Glucose reference range applies only to samples taken after fasting for at least 8 hours.  BUN 37 (H) 8 - 23 mg/dL  Creatinine, Ser 8.87 9.38 - 1.24 mg/dL  Calcium  8.6 (L) 8.9 - 10.3 mg/dL  GFR, Estimated >39 >39 mL/min   Comment: (NOTE) Calculated using the CKD-EPI Creatinine Equation (2021)   Anion gap 9 5 - 15   Comment: Performed at [facility], [address] [place], [place] [address] CBC     Status: Abnormal  Collection Time: 07/23/24  4:57 AM Result Value Ref Range  WBC 18.8 (H) 4.0 - 10.5 K/uL  RBC 3.35 (L) 4.22 - 5.81 MIL/uL  Hemoglobin 9.3 (L) 13.0 - 17.0 g/dL  HCT 68.9 (L) 60.9 - 47.9 %  MCV 92.5 80.0 - 100.0 fL  MCH 27.8 26.0 - 34.0 pg  MCHC 30.0 30.0 - 36.0 g/dL  RDW 82.7 (H) 88.4 - 84.4 %  Platelets 247 150 - 400 K/uL  nRBC 0.0 0.0 - 0.2 %   Comment: Performed at [facility], [address], [place], [place] [address] Magnesium       Status: Abnormal  Collection Time: 07/23/24  4:57 AM Result Value Ref Range  Magnesium  2.7 (H) 1.7 - 2.4 mg/dL   Comment: Performed at [facility], [address], [place], [place] [address] Phosphorus     Status: None  Collection Time: 07/23/24  4:57 AM Result Value Ref Range  Phosphorus 4.6 2.5 - 4.6 mg/dL   Comment: Performed at [facility], [address] [place], [place] [address] Procalcitonin     Status: None  Collection Time: 07/23/24  4:57 AM Result Value Ref Range  Procalcitonin 0.26 ng/mL   Comment:        Interpretation: PCT (Procalcitonin) <= 0.5 ng/mL: Systemic infection (sepsis) is not likely. Local bacterial infection is possible. (NOTE)       Sepsis PCT Algorithm           Lower Respiratory Tract                                      Infection PCT Algorithm    ----------------------------     ----------------------------         PCT < 0.25 ng/mL  PCT < 0.10 ng/mL          Strongly encourage             Strongly discourage   discontinuation of antibiotics    initiation of antibiotics    ----------------------------     -----------------------------       PCT 0.25 - 0.50 ng/mL            PCT 0.10 - 0.25 ng/mL               OR       >80% decrease in PCT            Discourage initiation of                                            antibiotics      Encourage discontinuation           of antibiotics    ----------------------------     -----------------------------         PCT >= 0.50 ng/mL              PCT 0.26 - 0.50 ng/mL               AND        <80% decrease in PCT             Encourage initiation of                                             antibiotics       Encourage continuation           of antibiotics    ----------------------------     -----------------------------        PCT >= 0.50 ng/mL                  PCT > 0.50 ng/mL               AND         increase in PCT                  Strongly encourage                                       initiation of antibiotics    Strongly encourage escalation           of antibiotics                                     -----------------------------                                           PCT <= 0.25 ng/mL                                                 OR                                        >  80% decrease in PCT                                      Discontinue / Do not initiate                                             antibiotics  Performed at [facility], [address] [place], [place] [address]  Glucose, capillary     Status: Abnormal  Collection Time: 07/23/24  7:44 AM Result Value Ref Range  Glucose-Capillary 121 (H) 70 - 99 mg/dL   Comment: Glucose reference range applies only to samples taken after fasting for at least 8 hours. Culture, Respiratory w Gram Stain     Status: None  Collection Time: 07/23/24 11:32 AM  Specimen: Sputum Result Value Ref Range  Specimen Description     SPU Performed at [facility], [address], [place], [place] [address]   Special Requests     NONE Performed at [facility], [address] [place], [place] [address]   Gram Stain     RARE WBC PRESENT, PREDOMINANTLY PMN RARE GRAM POSITIVE COCCI   Culture     Normal respiratory flora-no Staph aureus or Pseudomonas seen Performed at [facility], [address], [place], [place] [address]   Report Status 07/25/2024 FINAL  Glucose, capillary     Status: Abnormal  Collection Time: 07/23/24 11:46 AM Result Value Ref Range  Glucose-Capillary 118 (H) 70 - 99 mg/dL   Comment: Glucose reference range applies only to samples taken after fasting for at least 8 hours. Glucose, capillary     Status: Abnormal  Collection Time: 07/23/24  3:34 PM Result Value Ref Range  Glucose-Capillary 103 (H) 70 - 99 mg/dL   Comment: Glucose reference range applies only to samples taken after fasting for at least 8 hours. Glucose,  capillary     Status: Abnormal  Collection Time: 07/23/24  7:17 PM Result Value Ref Range  Glucose-Capillary 115 (H) 70 - 99 mg/dL   Comment: Glucose reference range applies only to samples taken after fasting for at least 8 hours. Glucose, capillary     Status: Abnormal  Collection Time: 07/24/24 12:18 AM Result Value Ref Range  Glucose-Capillary 147 (H) 70 - 99 mg/dL   Comment: Glucose reference range applies only to samples taken after fasting for at least 8 hours. CBC     Status: Abnormal  Collection Time: 07/24/24  4:06 AM Result Value Ref Range  WBC 15.7 (H) 4.0 - 10.5 K/uL  RBC 2.93 (L) 4.22 - 5.81 MIL/uL  Hemoglobin 8.1 (L) 13.0 - 17.0 g/dL  HCT 72.4 (L) 60.9 - 47.9 %  MCV 93.9 80.0 - 100.0 fL  MCH 27.6 26.0 - 34.0 pg  MCHC 29.5 (L) 30.0 - 36.0 g/dL  RDW 82.4 (H) 88.4 - 84.4 %  Platelets 246 150 - 400 K/uL  nRBC 0.0 0.0 - 0.2 %   Comment: Performed at [facility], [address] [place], [place] [address] Renal function panel     Status: Abnormal  Collection Time: 07/24/24  4:06 AM Result Value Ref Range  Sodium 141 135 - 145 mmol/L  Potassium 4.3 3.5 - 5.1 mmol/L  Chloride 99 98 - 111 mmol/L  CO2 33 (H)  22 - 32 mmol/L  Glucose, Bld 160 (H) 70 - 99 mg/dL   Comment: Glucose reference range applies only to samples taken after fasting for at least 8 hours.  BUN 48 (H) 8 - 23 mg/dL  Creatinine, Ser 8.94 9.38 - 1.24 mg/dL  Calcium  8.7 (L) 8.9 - 10.3 mg/dL  Phosphorus 4.9 (H) 2.5 - 4.6 mg/dL  Albumin  2.9 (L) 3.5 - 5.0 g/dL  GFR, Estimated >39 >39 mL/min   Comment: (NOTE) Calculated using the CKD-EPI Creatinine Equation (2021)   Anion gap 9 5 - 15   Comment: Performed at [facility], [address], [place], [place] [address] Troponin I (High Sensitivity)     Status: Abnormal  Collection Time: 07/24/24  4:06 AM Result Value Ref Range  Troponin I (High Sensitivity) 72 (H) <18 ng/L   Comment: (NOTE) Elevated high sensitivity troponin I (hsTnI)  values and significant  changes across serial measurements may suggest ACS but many other  chronic and acute conditions are known to elevate hsTnI results.  Refer to the Links section for chest pain algorithms and additional  guidance. Performed at [facility], [address] [place], [place] [address]  Glucose, capillary     Status: Abnormal  Collection Time: 07/24/24  4:30 AM Result Value Ref Range  Glucose-Capillary 146 (H) 70 - 99 mg/dL   Comment: Glucose reference range applies only to samples taken after fasting for at least 8 hours. Glucose, capillary     Status: Abnormal  Collection Time: 07/24/24  7:13 AM Result Value Ref Range  Glucose-Capillary 116 (H) 70 - 99 mg/dL   Comment: Glucose reference range applies only to samples taken after fasting for at least 8 hours. Troponin I (High Sensitivity)     Status: Abnormal  Collection Time: 07/24/24  7:18 AM Result Value Ref Range  Troponin I (High Sensitivity) 75 (H) <18 ng/L   Comment: (NOTE) Elevated high sensitivity troponin I (hsTnI) values and significant  changes across serial measurements may suggest ACS but many other  chronic and acute conditions are known to elevate hsTnI results.  Refer to the Links section for chest pain algorithms and additional  guidance. Performed at [facility], [address] [place], [place] [address]  Brain natriuretic peptide     Status: Abnormal  Collection Time: 07/24/24  7:18 AM Result Value Ref Range  B Natriuretic Peptide 3,252.3 (H) 0.0 - 100.0 pg/mL   Comment: Performed at [facility], [address] [place], [place] [address] Troponin I (High Sensitivity)     Status: Abnormal  Collection Time: 07/24/24  9:22 AM Result Value Ref Range  Troponin I (High Sensitivity) 68 (H) <18 ng/L   Comment: (NOTE) Elevated high sensitivity troponin I (hsTnI) values and significant  changes across serial measurements may suggest ACS but many other   chronic and acute conditions are known to elevate hsTnI results.  Refer to the Links section for chest pain algorithms and additional  guidance. Performed at [facility], [address] [place], [place] [address]  MRSA Next Gen by PCR, Nasal     Status: None  Collection Time: 07/24/24 11:08 AM  Specimen: Nasal Mucosa; Nasal Swab Result Value Ref Range  MRSA by PCR Next Gen NOT DETECTED NOT DETECTED   Comment: (NOTE) The GeneXpert MRSA Assay (FDA approved for NASAL specimens only), is one component of a comprehensive MRSA colonization surveillance program. It is not intended to diagnose MRSA infection nor to guide or monitor treatment  for MRSA infections. Test performance is not FDA approved in patients less than 52 years old. Performed at [facility], [address] [place], [place] [address]  Glucose, capillary     Status: Abnormal  Collection Time: 07/24/24 11:26 AM Result Value Ref Range  Glucose-Capillary 140 (H) 70 - 99 mg/dL   Comment: Glucose reference range applies only to samples taken after fasting for at least 8 hours. Glucose, capillary     Status: Abnormal  Collection Time: 07/24/24  4:25 PM Result Value Ref Range  Glucose-Capillary 130 (H) 70 - 99 mg/dL   Comment: Glucose reference range applies only to samples taken after fasting for at least 8 hours. Glucose, capillary     Status: None  Collection Time: 07/24/24 11:18 PM Result Value Ref Range  Glucose-Capillary 98 70 - 99 mg/dL   Comment: Glucose reference range applies only to samples taken after fasting for at least 8 hours. Glucose, capillary     Status: Abnormal  Collection Time: 07/25/24  3:19 AM Result Value Ref Range  Glucose-Capillary 121 (H) 70 - 99 mg/dL   Comment: Glucose reference range applies only to samples taken after fasting for at least 8 hours. Basic metabolic panel with GFR     Status: Abnormal  Collection Time: 07/25/24  4:39 AM Result Value Ref  Range  Sodium 144 135 - 145 mmol/L  Potassium 4.8 3.5 - 5.1 mmol/L  Chloride 99 98 - 111 mmol/L  CO2 34 (H) 22 - 32 mmol/L  Glucose, Bld 125 (H) 70 - 99 mg/dL   Comment: Glucose reference range applies only to samples taken after fasting for at least 8 hours.  BUN 54 (H) 8 - 23 mg/dL  Creatinine, Ser 8.84 9.38 - 1.24 mg/dL  Calcium  8.5 (L) 8.9 - 10.3 mg/dL  GFR, Estimated >39 >39 mL/min   Comment: (NOTE) Calculated using the CKD-EPI Creatinine Equation (2021)   Anion gap 11 5 - 15   Comment: Performed at [facility], [address] [place], [place] [address] Magnesium      Status: Abnormal  Collection Time: 07/25/24  4:39 AM Result Value Ref Range  Magnesium  2.9 (H) 1.7 - 2.4 mg/dL   Comment: Performed at [facility], [address] [place], [place] [address] Phosphorus     Status: Abnormal  Collection Time: 07/25/24  4:39 AM Result Value Ref Range  Phosphorus 5.0 (H) 2.5 - 4.6 mg/dL   Comment: Performed at [facility], [address] [place], [place] [address] CBC     Status: Abnormal  Collection Time: 07/25/24  4:39 AM Result Value Ref Range  WBC 10.5 4.0 - 10.5 K/uL  RBC 2.74 (L) 4.22 - 5.81 MIL/uL  Hemoglobin 7.7 (L) 13.0 - 17.0 g/dL  HCT 73.7 (L) 60.9 - 47.9 %  MCV 95.6 80.0 - 100.0 fL  MCH 28.1 26.0 - 34.0 pg  MCHC 29.4 (L) 30.0 - 36.0 g/dL  RDW 82.5 (H) 88.4 - 84.4 %  Platelets 231 150 - 400 K/uL  nRBC 0.0 0.0 - 0.2 %   Comment: Performed at [facility], [address] [place], [place] [address] Glucose, capillary     Status: Abnormal  Collection Time: 07/25/24  8:13 AM Result Value Ref Range  Glucose-Capillary 102 (H) 70 - 99 mg/dL   Comment: Glucose reference range applies only to samples taken after fasting for at least 8 hours. Glucose, capillary     Status: Abnormal  Collection Time: 07/25/24 11:49 AM Result Value  Ref Range  Glucose-Capillary 127 (H) 70 - 99 mg/dL   Comment: Glucose reference range applies  only to samples taken after fasting for at least 8 hours. Prepare RBC (crossmatch)     Status: None  Collection Time: 07/25/24 12:33 PM Result Value Ref Range  Order Confirmation     ORDER PROCESSED BY BLOOD BANK Performed at [facility], [address] [place], [place] [address]  Basic metabolic panel     Status: Abnormal  Collection Time: 07/25/24  1:34 PM Result Value Ref Range  Sodium 140 135 - 145 mmol/L  Potassium 4.2 3.5 - 5.1 mmol/L  Chloride 95 (L) 98 - 111 mmol/L  CO2 31 22 - 32 mmol/L  Glucose, Bld 117 (H) 70 - 99 mg/dL   Comment: Glucose reference range applies only to samples taken after fasting for at least 8 hours.  BUN 55 (H) 8 - 23 mg/dL  Creatinine, Ser 8.79 9.38 - 1.24 mg/dL  Calcium  8.5 (L) 8.9 - 10.3 mg/dL  GFR, Estimated >39 >39 mL/min   Comment: (NOTE) Calculated using the CKD-EPI Creatinine Equation (2021)   Anion gap 14 5 - 15   Comment: Performed at [facility], [address] [place], [place] [address] Type and screen [facility]     Status: None  Collection Time: 07/25/24  1:34 PM Result Value Ref Range  ABO/RH(D) A POS   Antibody Screen NEG   Sample Expiration 07/28/2024,2359   Unit Number [id number]   Blood Component Type RED CELLS,LR   Unit division 00   Status of Unit ISSUED,FINAL   Transfusion Status OK TO TRANSFUSE   Crossmatch Result     Compatible Performed at [facility], [address] [place], [place] [address]  BPAM RBC     Status: None  Collection Time: 07/25/24  1:34 PM Result Value Ref Range  ISSUE DATE / TIME [id number]   Blood Product Unit Number [id number]   PRODUCT CODE [id number]   Unit Type and Rh 6200   Blood Product Expiration Date [id number]  Glucose, capillary     Status: Abnormal  Collection Time: 07/25/24  4:15 PM Result Value Ref Range  Glucose-Capillary 148 (H) 70 - 99 mg/dL   Comment: Glucose reference range applies only to samples taken after  fasting for at least 8 hours. CBC     Status: Abnormal  Collection Time: 07/26/24  4:26 AM Result Value Ref Range  WBC 16.1 (H) 4.0 - 10.5 K/uL  RBC 3.03 (L) 4.22 - 5.81 MIL/uL  Hemoglobin 8.7 (L) 13.0 - 17.0 g/dL  HCT 71.6 (L) 60.9 - 47.9 %  MCV 93.4 80.0 - 100.0 fL  MCH 28.7 26.0 - 34.0 pg  MCHC 30.7 30.0 - 36.0 g/dL  RDW 81.4 (H) 88.4 - 84.4 %  Platelets 274 150 - 400 K/uL  nRBC 0.0 0.0 - 0.2 %   Comment: Performed at [facility], [address] [place], [place] [address] Phosphorus     Status: None  Collection Time: 07/26/24  4:26 AM Result Value Ref Range  Phosphorus 4.0 2.5 - 4.6 mg/dL   Comment: Performed at [facility], [address] [place], [place] [address] Magnesium      Status: None  Collection Time: 07/26/24  4:26 AM Result Value Ref Range  Magnesium  2.4 1.7 - 2.4 mg/dL   Comment: Performed at [facility], [address], [place], [place] [address] Basic metabolic panel with GFR     Status: Abnormal  Collection Time: 07/26/24  4:26 AM Result Value Ref Range  Sodium 137 135 - 145 mmol/L  Potassium 3.7 3.5 - 5.1 mmol/L  Chloride 94 (L) 98 - 111 mmol/L  CO2 32 22 - 32 mmol/L  Glucose, Bld 170 (H) 70 - 99 mg/dL   Comment: Glucose reference range applies only to samples taken after fasting for at least 8 hours.  BUN 53 (H) 8 - 23 mg/dL  Creatinine, Ser 8.61 (H) 0.61 - 1.24 mg/dL  Calcium  8.6 (L) 8.9 - 10.3 mg/dL  GFR, Estimated 57 (L) >60 mL/min   Comment: (NOTE) Calculated using the CKD-EPI Creatinine Equation (2021)   Anion gap 11 5 - 15   Comment: Performed at [facility], [address] [place], [place] [address] Glucose, capillary     Status: Abnormal  Collection Time: 07/26/24  7:38 AM Result Value Ref Range  Glucose-Capillary 128 (H) 70 - 99 mg/dL   Comment: Glucose reference range applies only to samples taken after fasting for at least 8 hours. Glucose, capillary     Status: Abnormal  Collection Time:  07/26/24 12:54 PM Result Value Ref Range  Glucose-Capillary 158 (H) 70 - 99 mg/dL   Comment: Glucose reference range applies only to samples taken after fasting for at least 8 hours. Basic metabolic panel     Status: Abnormal  Collection Time: 07/26/24  1:53 PM Result Value Ref Range  Sodium 135 135 - 145 mmol/L  Potassium 3.6 3.5 - 5.1 mmol/L  Chloride 95 (L) 98 - 111 mmol/L  CO2 30 22 - 32 mmol/L  Glucose, Bld 166 (H) 70 - 99 mg/dL   Comment: Glucose reference range applies only to samples taken after fasting for at least 8 hours.  BUN 49 (H) 8 - 23 mg/dL  Creatinine, Ser 8.72 (H) 0.61 - 1.24 mg/dL  Calcium  8.5 (L) 8.9 - 10.3 mg/dL  GFR, Estimated >39 >39 mL/min   Comment: (NOTE) Calculated using the CKD-EPI Creatinine Equation (2021)   Anion gap 10 5 - 15   Comment: Performed at [facility], [address] [place], [place] [address] CBC     Status: Abnormal  Collection Time: 07/27/24  5:35 AM Result Value Ref Range  WBC 13.4 (H) 4.0 - 10.5 K/uL  RBC 3.03 (L) 4.22 - 5.81 MIL/uL  Hemoglobin 8.7 (L) 13.0 - 17.0 g/dL  HCT 71.5 (L) 60.9 - 47.9 %  MCV 93.7 80.0 - 100.0 fL  MCH 28.7 26.0 - 34.0 pg  MCHC 30.6 30.0 - 36.0 g/dL  RDW 81.4 (H) 88.4 - 84.4 %  Platelets 251 150 - 400 K/uL  nRBC 0.0 0.0 - 0.2 %   Comment: Performed at [facility], [address] [place], [place] [address] Procalcitonin     Status: None  Collection Time: 07/27/24  5:35 AM Result Value Ref Range  Procalcitonin <0.10 ng/mL   Comment:        Interpretation: PCT (Procalcitonin) <= 0.5 ng/mL: Systemic infection (sepsis) is not likely. Local bacterial infection is possible. (NOTE)       Sepsis PCT Algorithm           Lower Respiratory Tract                                      Infection PCT Algorithm    ----------------------------     ----------------------------  PCT < 0.25 ng/mL                PCT < 0.10 ng/mL          Strongly encourage             Strongly  discourage   discontinuation of antibiotics    initiation of antibiotics    ----------------------------     -----------------------------       PCT 0.25 - 0.50 ng/mL            PCT 0.10 - 0.25 ng/mL               OR       >80% decrease in PCT            Discourage initiation of                                            antibiotics      Encourage discontinuation           of antibiotics    ----------------------------     -----------------------------         PCT >= 0.50 ng/mL              PCT 0.26 - 0.50 ng/mL               AND        <80% decrease in PCT             Encourage initiation of                                             antibiotics       Encourage continuation           of antibiotics    ----------------------------     -----------------------------        PCT >= 0.50 ng/mL                  PCT > 0.50 ng/mL               AND         increase in PCT                  Strongly encourage                                      initiation of antibiotics    Strongly encourage escalation           of antibiotics                                     -----------------------------                                           PCT <= 0.25 ng/mL  OR                                        > 80% decrease in PCT                                      Discontinue / Do not initiate                                             antibiotics  Performed at [facility], [address] [place], [place] [address]  Magnesium      Status: None  Collection Time: 07/27/24  5:36 AM Result Value Ref Range  Magnesium  2.0 1.7 - 2.4 mg/dL   Comment: Performed at [facility], [address] [place], [place] [address] Basic metabolic panel     Status: Abnormal  Collection Time: 07/27/24  5:36 AM Result Value Ref Range  Sodium 138 135 - 145 mmol/L  Potassium 3.2 (L) 3.5 - 5.1 mmol/L  Chloride 97 (L) 98 - 111 mmol/L  CO2 27 22 - 32  mmol/L  Glucose, Bld 115 (H) 70 - 99 mg/dL   Comment: Glucose reference range applies only to samples taken after fasting for at least 8 hours.  BUN 41 (H) 8 - 23 mg/dL  Creatinine, Ser 8.60 (H) 0.61 - 1.24 mg/dL  Calcium  8.8 (L) 8.9 - 10.3 mg/dL  GFR, Estimated 56 (L) >60 mL/min   Comment: (NOTE) Calculated using the CKD-EPI Creatinine Equation (2021)   Anion gap 14 5 - 15   Comment: Performed at [facility], [address] [place], [place] [address] Magnesium      Status: None  Collection Time: 07/28/24  6:15 AM Result Value Ref Range  Magnesium  1.9 1.7 - 2.4 mg/dL   Comment: Performed at [facility], [address] [place], [place] [address] Basic metabolic panel with GFR     Status: Abnormal  Collection Time: 07/28/24  6:15 AM Result Value Ref Range  Sodium 138 135 - 145 mmol/L  Potassium 3.6 3.5 - 5.1 mmol/L  Chloride 99 98 - 111 mmol/L  CO2 29 22 - 32 mmol/L  Glucose, Bld 118 (H) 70 - 99 mg/dL   Comment: Glucose reference range applies only to samples taken after fasting for at least 8 hours.  BUN 35 (H) 8 - 23 mg/dL  Creatinine, Ser 8.67 (H) 0.61 - 1.24 mg/dL  Calcium  9.5 8.9 - 10.3 mg/dL  GFR, Estimated 60 (L) >60 mL/min   Comment: (NOTE) Calculated using the CKD-EPI Creatinine Equation (2021)   Anion gap 10 5 - 15   Comment: Performed at [facility], [address] [place], [place] [address] Basic metabolic panel with GFR     Status: Abnormal  Collection Time: 07/29/24  3:24 AM Result Value Ref Range  Sodium 136 135 - 145 mmol/L  Potassium 3.6 3.5 - 5.1 mmol/L  Chloride 96 (L) 98 - 111 mmol/L  CO2 27 22 - 32 mmol/L  Glucose, Bld 102 (H) 70 - 99 mg/dL   Comment: Glucose reference range applies only to samples taken after fasting for at least 8 hours.  BUN 34 (H) 8 - 23 mg/dL  Creatinine,  Ser 1.46 (H) 0.61 - 1.24 mg/dL  Calcium  9.0 8.9 - 10.3 mg/dL  GFR, Estimated 53 (L) >60 mL/min   Comment: (NOTE) Calculated using the  CKD-EPI Creatinine Equation (2021)   Anion gap 13 5 - 15   Comment: Performed at [facility], [address] [place], [place] [address] Brain natriuretic peptide     Status: Abnormal  Collection Time: 07/29/24  7:52 AM Result Value Ref Range  B Natriuretic Peptide 977.9 (H) 0.0 - 100.0 pg/mL   Comment: Performed at [facility], [address] [place], [place] [address] Basic metabolic panel with GFR     Status: Abnormal  Collection Time: 07/30/24  5:52 AM Result Value Ref Range  Sodium 137 135 - 145 mmol/L  Potassium 3.6 3.5 - 5.1 mmol/L  Chloride 97 (L) 98 - 111 mmol/L  CO2 29 22 - 32 mmol/L  Glucose, Bld 138 (H) 70 - 99 mg/dL   Comment: Glucose reference range applies only to samples taken after fasting for at least 8 hours.  BUN 41 (H) 8 - 23 mg/dL  Creatinine, Ser 8.63 (H) 0.61 - 1.24 mg/dL  Calcium  8.8 (L) 8.9 - 10.3 mg/dL  GFR, Estimated 58 (L) >60 mL/min   Comment: (NOTE) Calculated using the CKD-EPI Creatinine Equation (2021)   Anion gap 11 5 - 15   Comment: Performed at [facility], [address] [place], [place] [address] CBC     Status: Abnormal  Collection Time: 07/30/24  5:52 AM Result Value Ref Range  WBC 8.8 4.0 - 10.5 K/uL  RBC 3.00 (L) 4.22 - 5.81 MIL/uL  Hemoglobin 8.5 (L) 13.0 - 17.0 g/dL  HCT 72.7 (L) 60.9 - 47.9 %  MCV 90.7 80.0 - 100.0 fL  MCH 28.3 26.0 - 34.0 pg  MCHC 31.3 30.0 - 36.0 g/dL  RDW 80.9 (H) 88.4 - 84.4 %  Platelets 310 150 - 400 K/uL  nRBC 0.0 0.0 - 0.2 %   Comment: Performed at [facility], [address], [place], [place] [address] Basic metabolic panel with GFR     Status: Abnormal  Collection Time: 07/31/24  7:05 AM Result Value Ref Range  Sodium 138 135 - 145 mmol/L  Potassium 3.9 3.5 - 5.1 mmol/L  Chloride 101 98 - 111 mmol/L  CO2 28 22 - 32 mmol/L  Glucose, Bld 143 (H) 70 - 99 mg/dL   Comment: Glucose reference range applies only to samples taken after fasting for at least 8  hours.  BUN 40 (H) 8 - 23 mg/dL  Creatinine, Ser 8.66 (H) 0.61 - 1.24 mg/dL  Calcium  8.7 (L) 8.9 - 10.3 mg/dL  GFR, Estimated 59 (L) >60 mL/min   Comment: (NOTE) Calculated using the CKD-EPI Creatinine Equation (2021)   Anion gap 9 5 - 15   Comment: Performed at [facility], [address] [place], [place] [address] CBC with Differential     Status: Abnormal  Collection Time: 08/12/24  2:57 PM Result Value Ref Range  WBC 12.2 (H) 4.0 - 10.5 K/uL  RBC 1.69 (L) 4.22 - 5.81 MIL/uL  Hemoglobin 4.7 (LL) 13.0 - 17.0 g/dL   Comment: REPEATED TO VERIFY This critical result has been called to [facility] by Vashti Diego on 08/12/2024 15:41:00, and has been read back.   HCT 15.8 (L) 39.0 - 52.0 %  MCV 93.5 80.0 - 100.0 fL  MCH 27.8 26.0 - 34.0 pg  MCHC 29.7 (L) 30.0 - 36.0 g/dL  RDW 82.7 (  H) 11.5 - 15.5 %  Platelets 330 150 - 400 K/uL  nRBC 0.0 0.0 - 0.2 %  Neutrophils Relative % 78 %  Neutro Abs 9.6 (H) 1.7 - 7.7 K/uL  Lymphocytes Relative 9 %  Lymphs Abs 1.1 0.7 - 4.0 K/uL  Monocytes Relative 11 %  Monocytes Absolute 1.3 (H) 0.1 - 1.0 K/uL  Eosinophils Relative 1 %  Eosinophils Absolute 0.1 0.0 - 0.5 K/uL  Basophils Relative 0 %  Basophils Absolute 0.0 0.0 - 0.1 K/uL  Immature Granulocytes 1 %  Abs Immature Granulocytes 0.06 0.00 - 0.07 K/uL  Abs Granulocyte 9.6 (H) 1.5 - 6.5 K/uL   Comment: Performed at [facility], [address] [place], [place] [address] Comprehensive metabolic panel     Status: Abnormal  Collection Time: 08/12/24  2:57 PM Result Value Ref Range  Sodium 133 (L) 135 - 145 mmol/L  Potassium 3.7 3.5 - 5.1 mmol/L  Chloride 94 (L) 98 - 111 mmol/L  CO2 26 22 - 32 mmol/L  Glucose, Bld 133 (H) 70 - 99 mg/dL   Comment: Glucose reference range applies only to samples taken after fasting for at least 8 hours.  BUN 33 (H) 8 - 23 mg/dL  Creatinine, Ser 8.31 (H) 0.61 - 1.24 mg/dL  Calcium  8.4 (L) 8.9 - 10.3 mg/dL  Total Protein 6.9 6.5  - 8.1 g/dL  Albumin  3.1 (L) 3.5 - 5.0 g/dL  AST 31 15 - 41 U/L  ALT 14 0 - 44 U/L  Alkaline Phosphatase 49 38 - 126 U/L  Total Bilirubin 1.0 0.0 - 1.2 mg/dL  GFR, Estimated 45 (L) >60 mL/min   Comment: (NOTE) Calculated using the CKD-EPI Creatinine Equation (2021)   Anion gap 13 5 - 15   Comment: Performed at [facility], [address] [place], [place] [address] Lipase, blood     Status: None  Collection Time: 08/12/24  2:57 PM Result Value Ref Range  Lipase 33 11 - 51 U/L   Comment: Performed at [facility], [address] [place], [place] [address] Lactic acid, plasma     Status: Abnormal  Collection Time: 08/12/24  2:57 PM Result Value Ref Range  Lactic Acid, Venous 3.2 (HH) 0.5 - 1.9 mmol/L   Comment: CRITICAL RESULT CALLED TO, READ BACK BY AND VERIFIED WITH MEGAN ASHBURN 08/12/24 1614 MU Performed at [facility], [address] [place], [place] [address]  Troponin I (High Sensitivity)     Status: Abnormal  Collection Time: 08/12/24  2:57 PM Result Value Ref Range  Troponin I (High Sensitivity) 888 (HH) <18 ng/L   Comment: CRITICAL RESULT CALLED TO, READ BACK BY AND VERIFIED WITH MEGAN ASHBURN 08/12/24 1557 MU (NOTE) Elevated high sensitivity troponin I (hsTnI) values and significant  changes across serial measurements may suggest ACS but many other  chronic and acute conditions are known to elevate hsTnI results.  Refer to the Links section for chest pain algorithms and additional  guidance. Performed at [facility], [address] [place], [place] [address]  Brain natriuretic peptide     Status: Abnormal  Collection Time: 08/12/24  2:57 PM Result Value Ref Range  B Natriuretic Peptide 1,514.9 (H) 0.0 - 100.0 pg/mL   Comment: Performed at [facility], [address] [place], [place] [address] Type and screen [facility]     Status: None  Collection Time: 08/12/24  2:57  PM Result Value Ref Range  ABO/RH(D) A POS  Antibody Screen NEG   Sample Expiration 08/15/2024,2359   Unit Number [id number]   Blood Component Type RED CELLS,LR   Unit division 00   Status of Unit ISSUED,FINAL   Transfusion Status OK TO TRANSFUSE   Crossmatch Result COMPATIBLE   Unit tag comment EMERGENCY RELEASE   Unit Number [id number]   Blood Component Type RED CELLS,LR   Unit division 00   Status of Unit ISSUED,FINAL   Transfusion Status OK TO TRANSFUSE   Crossmatch Result COMPATIBLE   Unit tag comment EMERGENCY RELEASE   Unit Number [id number]   Blood Component Type RED CELLS,LR   Unit division 00   Status of Unit ISSUED,FINAL   Transfusion Status OK TO TRANSFUSE   Crossmatch Result Compatible   Unit Number [id number]   Blood Component Type RED CELLS,LR   Unit division 00   Status of Unit ISSUED,FINAL   Transfusion Status OK TO TRANSFUSE   Crossmatch Result Compatible   Unit Number [id number]   Blood Component Type RED CELLS,LR   Unit division 00   Status of Unit ISSUED,FINAL   Transfusion Status OK TO TRANSFUSE   Crossmatch Result     Compatible Performed at [facility], [address] [place], [place] [address]  Protime-INR     Status: Abnormal  Collection Time: 08/12/24  2:57 PM Result Value Ref Range  Prothrombin Time 15.8 (H) 11.4 - 15.2 seconds  INR 1.2 0.8 - 1.2   Comment: (NOTE) INR goal varies based on device and disease states. Performed at [facility], [address] [place], [place] [address]  BPAM RBC     Status: None  Collection Time: 08/12/24  2:57 PM Result Value Ref Range  ISSUE DATE / TIME 797491738480   Blood Product Unit Number [id number]   PRODUCT CODE [id number]   Unit Type and Rh 5100   Blood Product Expiration Date 797490717640   ISSUE DATE / TIME 797491738480   Blood Product Unit Number [id number]   PRODUCT CODE [id number]   Unit Type and Rh 5100   Blood Product Expiration  Date 797490717640   ISSUE DATE / TIME 797491738358   Blood Product Unit Number [id number]   PRODUCT CODE [id number]   Unit Type and Rh 6200   Blood Product Expiration Date 797490787640   ISSUE DATE / TIME 797491738191   Blood Product Unit Number [id number]   PRODUCT CODE [id number]   Unit Type and Rh 6200   Blood Product Expiration Date 797490787640   ISSUE DATE / TIME 797491708771   Blood Product Unit Number [id number]   PRODUCT CODE [id number]   Unit Type and Rh 6200   Blood Product Expiration Date 797490857640  Blood culture (routine x 2)     Status: None  Collection Time: 08/12/24  3:07 PM  Specimen: BLOOD Result Value Ref Range  Specimen Description BLOOD BLOOD RIGHT FOREARM   Special Requests     BOTTLES DRAWN AEROBIC AND ANAEROBIC Blood Culture results may not be optimal due to an inadequate volume of blood received in culture bottles  Culture     NO GROWTH 5 DAYS Performed at [facility], [address] [place], [place] [address]   Report Status 08/17/2024 FINAL  Blood culture (routine x 2)     Status: None  Collection Time: 08/12/24  3:07 PM  Specimen: BLOOD Result Value Ref Range  Specimen Description BLOOD LEFT WRIST   Special Requests     BOTTLES DRAWN AEROBIC AND ANAEROBIC Blood Culture results may not be  optimal due to an inadequate volume of blood received in culture bottles  Culture     NO GROWTH 5 DAYS Performed at [facility], [address] [place] [place], [place] [address]   Report Status 08/17/2024 FINAL  Prepare RBC (crossmatch)     Status: None  Collection Time: 08/12/24  3:12 PM Result Value Ref Range  Order Confirmation     ORDER PROCESSED BY BLOOD BANK Performed at [facility], [address] [place], [place] [address]  Prepare RBC (crossmatch)     Status: None  Collection Time: 08/12/24  3:42 PM Result Value Ref Range  Order Confirmation     DUPLICATE REQUEST Performed at [facility], [address] [place], [place] [address]  Lactic acid, plasma     Status: Abnormal  Collection Time: 08/12/24  4:57 PM Result Value Ref Range  Lactic Acid, Venous 2.4 (HH) 0.5 - 1.9 mmol/L   Comment: CRITICAL VALUE NOTED. VALUE IS CONSISTENT WITH PREVIOUSLY REPORTED/CALLED VALUE SS Performed at [facility], [address] [place], [place] [address]  Troponin I (High Sensitivity)     Status: Abnormal  Collection Time: 08/12/24  4:57 PM Result Value Ref Range  Troponin I (High Sensitivity) 793 (HH) <18 ng/L   Comment: CRITICAL VALUE NOTED. VALUE IS CONSISTENT WITH PREVIOUSLY REPORTED/CALLED VALUE SS (NOTE) Elevated high sensitivity troponin I (hsTnI) values and significant  changes across serial measurements may suggest ACS but many other  chronic and acute conditions are known to elevate hsTnI results.  Refer to the Links section for chest pain algorithms and additional  guidance. Performed at [facility], [address] [place], [place] [address]  Prepare platelet pheresis     Status: None  Collection Time: 08/12/24  5:05 PM Result Value Ref Range  Unit Number [id number]   Blood Component Type PLTP3 PSORALEN TREATED   Unit division 00   Status of Unit REL FROM [facility]   Transfusion Status OK TO TRANSFUSE  BPAM Platelet Pheresis     Status: None  Collection Time: 08/12/24  5:05 PM Result Value Ref Range  ISSUE DATE / TIME 797491728855   Blood Product Unit Number [id number]   PRODUCT CODE [id number]   Unit Type and Rh 5100   Blood Product Expiration Date 797491727640  Glucose, capillary     Status: Abnormal  Collection Time: 08/12/24  6:42 PM Result Value Ref Range  Glucose-Capillary 114 (H) 70 - 99 mg/dL   Comment: Glucose reference range applies only to samples taken after fasting for at least 8 hours. CBC with Differential/Platelet     Status: Abnormal  Collection Time: 08/12/24  9:01 PM Result Value Ref Range  WBC 14.5 (H) 4.0 - 10.5 K/uL  RBC 3.25  (L) 4.22 - 5.81 MIL/uL  Hemoglobin 9.3 (L) 13.0 - 17.0 g/dL   Comment: REPEATED TO VERIFY  HCT 28.4 (L) 39.0 - 52.0 %  MCV 87.4 80.0 - 100.0 fL  MCH 28.6 26.0 - 34.0 pg  MCHC 32.7 30.0 - 36.0 g/dL  RDW 84.0 (H) 88.4 - 84.4 %  Platelets 307 150 - 400 K/uL  nRBC 0.0 0.0 - 0.2 %  Neutrophils Relative % 72 %  Neutro Abs 10.5 (H) 1.7 - 7.7 K/uL  Lymphocytes Relative 12 %  Lymphs Abs 1.7 0.7 - 4.0 K/uL  Monocytes Relative 14 %  Monocytes Absolute 2.0 (H) 0.1 - 1.0 K/uL  Eosinophils Relative 1 %  Eosinophils Absolute 0.1 0.0 - 0.5 K/uL  Basophils Relative  1 %  Basophils Absolute 0.1 0.0 - 0.1 K/uL  Immature Granulocytes 0 %  Abs Immature Granulocytes 0.05 0.00 - 0.07 K/uL  Abs Granulocyte 10.5 (H) 1.5 - 6.5 K/uL   Comment: Performed at [facility], [address] [place], [place] [address] CBC with Differential/Platelet     Status: Abnormal  Collection Time: 08/13/24  4:01 AM Result Value Ref Range  WBC 13.0 (H) 4.0 - 10.5 K/uL  RBC 2.91 (L) 4.22 - 5.81 MIL/uL  Hemoglobin 8.3 (L) 13.0 - 17.0 g/dL  HCT 74.6 (L) 60.9 - 47.9 %  MCV 86.9 80.0 - 100.0 fL  MCH 28.5 26.0 - 34.0 pg  MCHC 32.8 30.0 - 36.0 g/dL  RDW 83.8 (H) 88.4 - 84.4 %  Platelets 302 150 - 400 K/uL  nRBC 0.0 0.0 - 0.2 %  Neutrophils Relative % 71 %  Neutro Abs 9.3 (H) 1.7 - 7.7 K/uL  Lymphocytes Relative 13 %  Lymphs Abs 1.7 0.7 - 4.0 K/uL  Monocytes Relative 12 %  Monocytes Absolute 1.6 (H) 0.1 - 1.0 K/uL  Eosinophils Relative 2 %  Eosinophils Absolute 0.2 0.0 - 0.5 K/uL  Basophils Relative 1 %  Basophils Absolute 0.1 0.0 - 0.1 K/uL  Immature Granulocytes 1 %  Abs Immature Granulocytes 0.07 0.00 - 0.07 K/uL   Comment: Performed at [facility], [address] [place], [place] [address] Comprehensive metabolic panel     Status: Abnormal  Collection Time: 08/13/24  4:01 AM Result Value Ref Range  Sodium 134 (L) 135 - 145 mmol/L  Potassium 3.5 3.5 - 5.1 mmol/L  Chloride 101 98 - 111  mmol/L  CO2 26 22 - 32 mmol/L  Glucose, Bld 107 (H) 70 - 99 mg/dL   Comment: Glucose reference range applies only to samples taken after fasting for at least 8 hours.  BUN 28 (H) 8 - 23 mg/dL  Creatinine, Ser 8.43 (H) 0.61 - 1.24 mg/dL  Calcium  7.8 (L) 8.9 - 10.3 mg/dL  Total Protein 6.6 6.5 - 8.1 g/dL  Albumin  2.9 (L) 3.5 - 5.0 g/dL  AST 19 15 - 41 U/L  ALT 12 0 - 44 U/L  Alkaline Phosphatase 50 38 - 126 U/L  Total Bilirubin 1.3 (H) 0.0 - 1.2 mg/dL  GFR, Estimated 49 (L) >60 mL/min   Comment: (NOTE) Calculated using the CKD-EPI Creatinine Equation (2021)   Anion gap 7 5 - 15   Comment: Performed at [facility], [address] [place], [place] [address] Magnesium      Status: None  Collection Time: 08/13/24  4:01 AM Result Value Ref Range  Magnesium  2.1 1.7 - 2.4 mg/dL   Comment: Performed at [facility], [address], [place], [place] [address] Phosphorus     Status: None  Collection Time: 08/13/24  4:01 AM Result Value Ref Range  Phosphorus 4.3 2.5 - 4.6 mg/dL   Comment: Performed at [facility], [address] [place], [place] [address] MRSA Next Gen by PCR, Nasal     Status: Abnormal  Collection Time: 08/13/24  7:51 AM  Specimen: Nasal Mucosa; Nasal Swab Result Value Ref Range  MRSA by PCR Next Gen DETECTED (A) NOT DETECTED   Comment: RESULT CALLED TO, READ BACK BY AND VERIFIED WITH: DEBBIE PANKERTON 08/13/24 0930 MW (NOTE) The GeneXpert MRSA Assay (FDA approved for NASAL specimens only), is one component of a comprehensive MRSA colonization surveillance program. It is not intended to diagnose MRSA infection nor to guide or monitor  treatment for MRSA infections. Test performance is not FDA approved in patients less than 87 years old. Performed at [facility], [address] [place], [place] [address]  Resp panel by RT-PCR (RSV, Flu A&B, Covid) Anterior Nasal Swab     Status: Abnormal  Collection Time: 08/13/24  7:51  AM  Specimen: Anterior Nasal Swab Result Value Ref Range  SARS Coronavirus 2 by RT PCR POSITIVE (A) NEGATIVE   Comment: (NOTE) SARS-CoV-2 target nucleic acids are DETECTED.  The SARS-CoV-2 RNA is generally detectable in upper respiratory specimens during the acute phase of infection. Positive results are indicative of the presence of the identified virus, but do not rule out bacterial infection or co-infection with other pathogens not detected by the test. Clinical correlation with patient history and other diagnostic information is necessary to determine patient infection status. The expected result is Negative.  Fact Sheet for Patients: bloggercourse.com  Fact Sheet for Healthcare Providers: seriousbroker.it  This test is not yet approved or cleared by the [place]  FDA and  has been authorized for detection and/or diagnosis of SARS-CoV-2 by FDA under an Emergency Use Authorization (EUA).  This EUA will remain in effect (meaning this test can be used) for the duration of  the COVID-19 declaration under Section 564(b)(1) of the A ct, 21 U.S.C. section 360bbb-3(b)(1), unless the authorization is terminated or revoked sooner.    Influenza A by PCR NEGATIVE NEGATIVE  Influenza B by PCR NEGATIVE NEGATIVE   Comment: (NOTE) The Xpert Xpress SARS-CoV-2/FLU/RSV plus assay is intended as an aid in the diagnosis of influenza from Nasopharyngeal swab specimens and should not be used as a sole basis for treatment. Nasal washings and aspirates are unacceptable for Xpert Xpress SARS-CoV-2/FLU/RSV testing.  Fact Sheet for Patients: bloggercourse.com  Fact Sheet for Healthcare Providers: seriousbroker.it  This test is not yet approved or cleared by the [place]  FDA and has been authorized for detection and/or diagnosis of SARS-CoV-2 by FDA under an Emergency Use Authorization  (EUA). This EUA will remain in effect (meaning this test can be used) for the duration of the COVID-19 declaration under Section 564(b)(1) of the Act, 21 U.S.C. section 360bbb-3(b)(1), unless the authorization is terminated or revoked.    Resp Syncytial Virus by PCR NEGATIVE NEGATIVE   Comment: (NOTE) Fact Sheet for Patients: bloggercourse.com  Fact Sheet for Healthcare Providers: seriousbroker.it  This test is not yet approved or cleared by the [place]  FDA and has been authorized for detection and/or diagnosis of SARS-CoV-2 by FDA under an Emergency Use Authorization (EUA). This EUA will remain in effect (meaning this test can be used) for the duration of the COVID-19 declaration under Section 564(b)(1) of the Act, 21 U.S.C. section 360bbb-3(b)(1), unless the authorization is terminated or revoked.  Performed at [facility], [address] [place], [place] [address]  Respiratory (~20 pathogens) panel by PCR     Status: None  Collection Time: 08/13/24  7:51 AM  Specimen: Nasopharyngeal Swab; Respiratory Result Value Ref Range  Adenovirus NOT DETECTED NOT DETECTED  Coronavirus 229E NOT DETECTED NOT DETECTED   Comment: (NOTE) The Coronavirus on the Respiratory Panel, DOES NOT test for the novel  Coronavirus (2019 nCoV)   Coronavirus HKU1 NOT DETECTED NOT DETECTED  Coronavirus NL63 NOT DETECTED NOT DETECTED  Coronavirus OC43 NOT DETECTED NOT DETECTED  Metapneumovirus NOT DETECTED NOT DETECTED  Rhinovirus / Enterovirus NOT DETECTED NOT DETECTED  Influenza A NOT DETECTED NOT DETECTED  Influenza B NOT DETECTED NOT DETECTED  Parainfluenza Virus 1 NOT DETECTED NOT DETECTED  Parainfluenza Virus 2 NOT DETECTED NOT DETECTED  Parainfluenza Virus 3 NOT DETECTED NOT DETECTED  Parainfluenza Virus 4 NOT DETECTED NOT DETECTED  Respiratory Syncytial Virus NOT DETECTED NOT DETECTED  Bordetella pertussis NOT DETECTED NOT  DETECTED  Bordetella Parapertussis NOT DETECTED NOT DETECTED  Chlamydophila pneumoniae NOT DETECTED NOT DETECTED  Mycoplasma pneumoniae NOT DETECTED NOT DETECTED   Comment: Performed at [facility], [address] [place], [place] [address] Glucose, capillary     Status: Abnormal  Collection Time: 08/13/24  7:51 AM Result Value Ref Range  Glucose-Capillary 100 (H) 70 - 99 mg/dL   Comment: Glucose reference range applies only to samples taken after fasting for at least 8 hours. CBC with Differential/Platelet     Status: Abnormal  Collection Time: 08/13/24  8:56 AM Result Value Ref Range  WBC 15.0 (H) 4.0 - 10.5 K/uL  RBC 3.12 (L) 4.22 - 5.81 MIL/uL  Hemoglobin 8.8 (L) 13.0 - 17.0 g/dL  HCT 72.2 (L) 60.9 - 47.9 %  MCV 88.8 80.0 - 100.0 fL  MCH 28.2 26.0 - 34.0 pg  MCHC 31.8 30.0 - 36.0 g/dL  RDW 83.4 (H) 88.4 - 84.4 %  Platelets 328 150 - 400 K/uL  nRBC 0.0 0.0 - 0.2 %  Neutrophils Relative % 78 %  Neutro Abs 11.8 (H) 1.7 - 7.7 K/uL  Lymphocytes Relative 9 %  Lymphs Abs 1.3 0.7 - 4.0 K/uL  Monocytes Relative 10 %  Monocytes Absolute 1.6 (H) 0.1 - 1.0 K/uL  Eosinophils Relative 1 %  Eosinophils Absolute 0.2 0.0 - 0.5 K/uL  Basophils Relative 1 %  Basophils Absolute 0.1 0.0 - 0.1 K/uL  Immature Granulocytes 1 %  Abs Immature Granulocytes 0.07 0.00 - 0.07 K/uL   Comment: Performed at [facility], [address] [place], [place] [address] Urinalysis, Complete w Microscopic -Urine, Clean Catch     Status: Abnormal  Collection Time: 08/13/24  9:52 AM Result Value Ref Range  Color, Urine YELLOW (A) YELLOW  APPearance CLEAR (A) CLEAR  Specific Gravity, Urine 1.026 1.005 - 1.030  pH 5.0 5.0 - 8.0  Glucose, UA >=500 (A) NEGATIVE mg/dL  Hgb urine dipstick SMALL (A) NEGATIVE  Bilirubin Urine NEGATIVE NEGATIVE  Ketones, ur NEGATIVE NEGATIVE mg/dL  Protein, ur NEGATIVE NEGATIVE mg/dL  Nitrite NEGATIVE NEGATIVE  Leukocytes,Ua NEGATIVE NEGATIVE  RBC / HPF 0-5 0 - 5  RBC/hpf  WBC, UA 0-5 0 - 5 WBC/hpf  Bacteria, UA NONE SEEN NONE SEEN  Squamous Epithelial / HPF 0-5 0 - 5 /HPF   Comment: Performed at [facility], [address] [place], [place] [address] Glucose, capillary     Status: Abnormal  Collection Time: 08/13/24 11:34 AM Result Value Ref Range  Glucose-Capillary 104 (H) 70 - 99 mg/dL   Comment: Glucose reference range applies only to samples taken after fasting for at least 8 hours. CBC with Differential/Platelet     Status: Abnormal  Collection Time: 08/13/24  3:13 PM Result Value Ref Range  WBC 13.9 (H) 4.0 - 10.5 K/uL  RBC 3.22 (L) 4.22 - 5.81 MIL/uL  Hemoglobin 9.0 (L) 13.0 - 17.0 g/dL  HCT 71.4 (L) 60.9 - 47.9 %  MCV 88.5 80.0 - 100.0 fL  MCH 28.0 26.0 - 34.0 pg  MCHC 31.6 30.0 - 36.0 g/dL  RDW 83.6 (H) 88.4 - 84.4 %  Platelets 291 150 - 400 K/uL  nRBC 0.0 0.0 - 0.2 %  Neutrophils  Relative % 94 %  Neutro Abs 13.1 (H) 1.7 - 7.7 K/uL  Lymphocytes Relative 3 %  Lymphs Abs 0.4 (L) 0.7 - 4.0 K/uL  Monocytes Relative 2 %  Monocytes Absolute 0.2 0.1 - 1.0 K/uL  Eosinophils Relative 0 %  Eosinophils Absolute 0.0 0.0 - 0.5 K/uL  Basophils Relative 0 %  Basophils Absolute 0.1 0.0 - 0.1 K/uL  Immature Granulocytes 1 %  Abs Immature Granulocytes 0.08 (H) 0.00 - 0.07 K/uL   Comment: Performed at [facility], [address] [place], [place] [address] Procalcitonin     Status: None  Collection Time: 08/13/24  3:13 PM Result Value Ref Range  Procalcitonin <0.10 ng/mL   Comment:        Interpretation: PCT (Procalcitonin) <= 0.5 ng/mL: Systemic infection (sepsis) is not likely. Local bacterial infection is possible. (NOTE)       Sepsis PCT Algorithm           Lower Respiratory Tract                                      Infection PCT Algorithm    ----------------------------     ----------------------------         PCT < 0.25 ng/mL                PCT < 0.10 ng/mL          Strongly encourage             Strongly  discourage   discontinuation of antibiotics    initiation of antibiotics    ----------------------------     -----------------------------       PCT 0.25 - 0.50 ng/mL            PCT 0.10 - 0.25 ng/mL               OR       >80% decrease in PCT            Discourage initiation of                                            antibiotics      Encourage discontinuation           of antibiotics    ----------------------------     -----------------------------         PCT >= 0.50 ng/mL              PCT 0.26 - 0.50 ng/mL               AND        <80% decrease in PCT             Encourage initiation of                                             antibiotics       Encourage continuation           of antibiotics    ----------------------------     -----------------------------        PCT >= 0.50 ng/mL  PCT > 0.50 ng/mL               AND         increase in PCT                  Strongly encourage                                      initiation of antibiotics    Strongly encourage escalation           of antibiotics                                     -----------------------------                                           PCT <= 0.25 ng/mL                                                 OR                                        > 80% decrease in PCT                                      Discontinue / Do not initiate                                             antibiotics  Performed at [facility], [address] [place], [place] [address]  Glucose, capillary     Status: Abnormal  Collection Time: 08/13/24  4:40 PM Result Value Ref Range  Glucose-Capillary 195 (H) 70 - 99 mg/dL   Comment: Glucose reference range applies only to samples taken after fasting for at least 8 hours. Glucose, capillary     Status: Abnormal  Collection Time: 08/13/24  7:41 PM Result Value Ref Range  Glucose-Capillary 179 (H) 70 - 99 mg/dL   Comment: Glucose reference range applies only  to samples taken after fasting for at least 8 hours. CBC with Differential/Platelet     Status: Abnormal  Collection Time: 08/13/24  8:49 PM Result Value Ref Range  WBC 11.0 (H) 4.0 - 10.5 K/uL  RBC 3.37 (L) 4.22 - 5.81 MIL/uL  Hemoglobin 9.5 (L) 13.0 - 17.0 g/dL  HCT 69.8 (L) 60.9 - 47.9 %  MCV 89.3 80.0 - 100.0 fL  MCH 28.2 26.0 - 34.0 pg  MCHC 31.6 30.0 - 36.0 g/dL  RDW 83.5 (H) 88.4 - 84.4 %  Platelets 342 150 - 400 K/uL  nRBC 0.0 0.0 - 0.2 %  Neutrophils Relative % 95 %  Neutro Abs 10.4 (H) 1.7 - 7.7 K/uL  Lymphocytes Relative 4 %  Lymphs Abs 0.4 (L) 0.7 - 4.0 K/uL  Monocytes Relative 1 %  Monocytes Absolute 0.1 0.1 - 1.0 K/uL  Eosinophils Relative 0 %  Eosinophils Absolute 0.0 0.0 - 0.5 K/uL  Basophils Relative 0 %  Basophils Absolute 0.0 0.0 - 0.1 K/uL  Immature Granulocytes 0 %  Abs Immature Granulocytes 0.04 0.00 - 0.07 K/uL   Comment: Performed at [facility], [address] [place], [place] [address] Glucose, capillary     Status: Abnormal  Collection Time: 08/13/24 11:42 PM Result Value Ref Range  Glucose-Capillary 177 (H) 70 - 99 mg/dL   Comment: Glucose reference range applies only to samples taken after fasting for at least 8 hours. CBC with Differential/Platelet     Status: Abnormal  Collection Time: 08/14/24  3:11 AM Result Value Ref Range  WBC 7.1 4.0 - 10.5 K/uL  RBC 2.82 (L) 4.22 - 5.81 MIL/uL  Hemoglobin 8.0 (L) 13.0 - 17.0 g/dL  HCT 74.9 (L) 60.9 - 47.9 %  MCV 88.7 80.0 - 100.0 fL  MCH 28.4 26.0 - 34.0 pg  MCHC 32.0 30.0 - 36.0 g/dL  RDW 83.9 (H) 88.4 - 84.4 %  Platelets 264 150 - 400 K/uL  nRBC 0.0 0.0 - 0.2 %  Neutrophils Relative % 88 %  Neutro Abs 6.2 1.7 - 7.7 K/uL  Lymphocytes Relative 9 %  Lymphs Abs 0.6 (L) 0.7 - 4.0 K/uL  Monocytes Relative 3 %  Monocytes Absolute 0.2 0.1 - 1.0 K/uL  Eosinophils Relative 0 %  Eosinophils Absolute 0.0 0.0 - 0.5 K/uL  Basophils Relative 0 %  Basophils Absolute 0.0 0.0 - 0.1 K/uL  Immature  Granulocytes 0 %  Abs Immature Granulocytes 0.02 0.00 - 0.07 K/uL   Comment: Performed at [facility], [address] [place], [place] [address] Basic metabolic panel with GFR     Status: Abnormal  Collection Time: 08/14/24  3:11 AM Result Value Ref Range  Sodium 135 135 - 145 mmol/L  Potassium 4.8 3.5 - 5.1 mmol/L  Chloride 99 98 - 111 mmol/L  CO2 26 22 - 32 mmol/L  Glucose, Bld 164 (H) 70 - 99 mg/dL   Comment: Glucose reference range applies only to samples taken after fasting for at least 8 hours.  BUN 27 (H) 8 - 23 mg/dL  Creatinine, Ser 8.62 (H) 0.61 - 1.24 mg/dL  Calcium  8.3 (L) 8.9 - 10.3 mg/dL  GFR, Estimated 57 (L) >60 mL/min   Comment: (NOTE) Calculated using the CKD-EPI Creatinine Equation (2021)   Anion gap 10 5 - 15   Comment: Performed at [facility], [address] [place], [place] [address] Magnesium      Status: None  Collection Time: 08/14/24  3:11 AM Result Value Ref Range  Magnesium  2.1 1.7 - 2.4 mg/dL   Comment: Performed at [facility], [address] [place], [place] [address] Phosphorus     Status: None  Collection Time: 08/14/24  3:11 AM Result Value Ref Range  Phosphorus 3.2 2.5 - 4.6 mg/dL   Comment: Performed at [facility], [address] [place], [place] [address] Glucose, capillary     Status: Abnormal  Collection Time: 08/14/24  4:06 AM Result Value Ref Range  Glucose-Capillary 166 (H) 70 - 99 mg/dL   Comment: Glucose reference range applies only to samples taken after fasting for at least 8 hours. CBC with Differential/Platelet     Status: Abnormal  Collection Time: 08/14/24 10:26 AM Result Value Ref Range  WBC 11.5 (H) 4.0 - 10.5 K/uL  RBC 3.01 (  L) 4.22 - 5.81 MIL/uL  Hemoglobin 8.5 (L) 13.0 - 17.0 g/dL  HCT 72.8 (L) 60.9 - 47.9 %  MCV 90.0 80.0 - 100.0 fL  MCH 28.2 26.0 - 34.0 pg  MCHC 31.4 30.0 - 36.0 g/dL  RDW 83.7 (H) 88.4 - 84.4 %  Platelets 303 150 - 400 K/uL  nRBC 0.0 0.0 - 0.2  %  Neutrophils Relative % 92 %  Neutro Abs 10.5 (H) 1.7 - 7.7 K/uL  Lymphocytes Relative 4 %  Lymphs Abs 0.5 (L) 0.7 - 4.0 K/uL  Monocytes Relative 4 %  Monocytes Absolute 0.4 0.1 - 1.0 K/uL  Eosinophils Relative 0 %  Eosinophils Absolute 0.0 0.0 - 0.5 K/uL  Basophils Relative 0 %  Basophils Absolute 0.0 0.0 - 0.1 K/uL  Immature Granulocytes 0 %  Abs Immature Granulocytes 0.05 0.00 - 0.07 K/uL   Comment: Performed at [facility], [address] [place], [place] [address] Glucose, capillary     Status: Abnormal  Collection Time: 08/14/24 12:07 PM Result Value Ref Range  Glucose-Capillary 194 (H) 70 - 99 mg/dL   Comment: Glucose reference range applies only to samples taken after fasting for at least 8 hours. CBC with Differential/Platelet     Status: Abnormal  Collection Time: 08/14/24  3:51 PM Result Value Ref Range  WBC 15.6 (H) 4.0 - 10.5 K/uL  RBC 3.13 (L) 4.22 - 5.81 MIL/uL  Hemoglobin 8.8 (L) 13.0 - 17.0 g/dL  HCT 72.4 (L) 60.9 - 47.9 %  MCV 87.9 80.0 - 100.0 fL  MCH 28.1 26.0 - 34.0 pg  MCHC 32.0 30.0 - 36.0 g/dL  RDW 83.8 (H) 88.4 - 84.4 %  Platelets 263 150 - 400 K/uL  nRBC 0.0 0.0 - 0.2 %  Neutrophils Relative % 85 %  Neutro Abs 13.3 (H) 1.7 - 7.7 K/uL  Lymphocytes Relative 6 %  Lymphs Abs 0.9 0.7 - 4.0 K/uL  Monocytes Relative 8 %  Monocytes Absolute 1.2 (H) 0.1 - 1.0 K/uL  Eosinophils Relative 0 %  Eosinophils Absolute 0.0 0.0 - 0.5 K/uL  Basophils Relative 0 %  Basophils Absolute 0.0 0.0 - 0.1 K/uL  Immature Granulocytes 1 %  Abs Immature Granulocytes 0.17 (H) 0.00 - 0.07 K/uL   Comment: Performed at [facility], [address] [place], [place] [address] Glucose, capillary     Status: Abnormal  Collection Time: 08/14/24  4:38 PM Result Value Ref Range  Glucose-Capillary 150 (H) 70 - 99 mg/dL   Comment: Glucose reference range applies only to samples taken after fasting for at least 8 hours. Basic metabolic panel with GFR      Status: Abnormal  Collection Time: 08/15/24  7:03 AM Result Value Ref Range  Sodium 130 (L) 135 - 145 mmol/L  Potassium 4.5 3.5 - 5.1 mmol/L  Chloride 97 (L) 98 - 111 mmol/L  CO2 26 22 - 32 mmol/L  Glucose, Bld 155 (H) 70 - 99 mg/dL   Comment: Glucose reference range applies only to samples taken after fasting for at least 8 hours.  BUN 31 (H) 8 - 23 mg/dL  Creatinine, Ser 8.62 (H) 0.61 - 1.24 mg/dL  Calcium  8.4 (L) 8.9 - 10.3 mg/dL  GFR, Estimated 57 (L) >60 mL/min   Comment: (NOTE) Calculated using the CKD-EPI Creatinine Equation (2021)   Anion gap 7 5 - 15   Comment: Performed at [facility], [address] [place], [place] [address] Magnesium      Status: None  Collection Time:  08/15/24  7:03 AM Result Value Ref Range  Magnesium  2.3 1.7 - 2.4 mg/dL   Comment: Performed at [facility], [address] [place], [place] [address] Phosphorus     Status: None  Collection Time: 08/15/24  7:03 AM Result Value Ref Range  Phosphorus 3.3 2.5 - 4.6 mg/dL   Comment: Performed at [facility], [address] [place], [place] [address] CBC     Status: Abnormal  Collection Time: 08/15/24  7:03 AM Result Value Ref Range  WBC 13.6 (H) 4.0 - 10.5 K/uL  RBC 2.83 (L) 4.22 - 5.81 MIL/uL  Hemoglobin 8.0 (L) 13.0 - 17.0 g/dL  HCT 73.8 (L) 60.9 - 47.9 %  MCV 92.2 80.0 - 100.0 fL  MCH 28.3 26.0 - 34.0 pg  MCHC 30.7 30.0 - 36.0 g/dL  RDW 83.7 (H) 88.4 - 84.4 %  Platelets 284 150 - 400 K/uL  nRBC 0.0 0.0 - 0.2 %   Comment: Performed at [facility], [address] [place], [place] [address] Procalcitonin     Status: None  Collection Time: 08/15/24  7:03 AM Result Value Ref Range  Procalcitonin <0.10 ng/mL   Comment:        Interpretation: PCT (Procalcitonin) <= 0.5 ng/mL: Systemic infection (sepsis) is not likely. Local bacterial infection is possible. (NOTE)       Sepsis PCT Algorithm           Lower Respiratory Tract                                       Infection PCT Algorithm    ----------------------------     ----------------------------         PCT < 0.25 ng/mL                PCT < 0.10 ng/mL          Strongly encourage             Strongly discourage   discontinuation of antibiotics    initiation of antibiotics    ----------------------------     -----------------------------       PCT 0.25 - 0.50 ng/mL            PCT 0.10 - 0.25 ng/mL               OR       >80% decrease in PCT            Discourage initiation of                                            antibiotics      Encourage discontinuation           of antibiotics    ----------------------------     -----------------------------         PCT >= 0.50 ng/mL              PCT 0.26 - 0.50 ng/mL               AND        <80% decrease in PCT             Encourage initiation of  antibiotics       Encourage continuation           of antibiotics    ----------------------------     -----------------------------        PCT >= 0.50 ng/mL                  PCT > 0.50 ng/mL               AND         increase in PCT                  Strongly encourage                                      initiation of antibiotics    Strongly encourage escalation           of antibiotics                                     -----------------------------                                           PCT <= 0.25 ng/mL                                                 OR                                        > 80% decrease in PCT                                      Discontinue / Do not initiate                                             antibiotics  Performed at [facility], [address] [address], [place], [place] [address]  Prepare RBC (crossmatch)     Status: None  Collection Time: 08/15/24  8:07 AM Result Value Ref Range  Order Confirmation     ORDER PROCESSED BY BLOOD BANK Performed at [facility], [address]  [place], [place] [address]  Glucose, capillary     Status: Abnormal  Collection Time: 08/15/24  8:37 AM Result Value Ref Range  Glucose-Capillary 166 (H) 70 - 99 mg/dL   Comment: Glucose reference range applies only to samples taken after fasting for at least 8 hours. Glucose, capillary     Status: Abnormal  Collection Time: 08/15/24 11:20 AM Result Value Ref Range  Glucose-Capillary 190 (H) 70 - 99 mg/dL   Comment: Glucose reference range applies only to samples taken after fasting for at least 8 hours. Glucose, capillary     Status: Abnormal  Collection Time: 08/15/24  4:41 PM Result Value Ref Range  Glucose-Capillary 147 (H)  70 - 99 mg/dL   Comment: Glucose reference range applies only to samples taken after fasting for at least 8 hours. Glucose, capillary     Status: Abnormal  Collection Time: 08/15/24  8:21 PM Result Value Ref Range  Glucose-Capillary 212 (H) 70 - 99 mg/dL   Comment: Glucose reference range applies only to samples taken after fasting for at least 8 hours. Glucose, capillary     Status: Abnormal  Collection Time: 08/16/24  1:47 AM Result Value Ref Range  Glucose-Capillary 125 (H) 70 - 99 mg/dL   Comment: Glucose reference range applies only to samples taken after fasting for at least 8 hours. Glucose, capillary     Status: Abnormal  Collection Time: 08/16/24  3:54 AM Result Value Ref Range  Glucose-Capillary 118 (H) 70 - 99 mg/dL   Comment: Glucose reference range applies only to samples taken after fasting for at least 8 hours. Phosphorus     Status: None  Collection Time: 08/16/24  6:00 AM Result Value Ref Range  Phosphorus 3.2 2.5 - 4.6 mg/dL   Comment: Performed at [facility], [address] [place], [place] [address] CBC     Status: Abnormal  Collection Time: 08/16/24  6:00 AM Result Value Ref Range  WBC 15.4 (H) 4.0 - 10.5 K/uL  RBC 3.32 (L) 4.22 - 5.81 MIL/uL  Hemoglobin 9.4 (L) 13.0 - 17.0 g/dL  HCT 69.4 (L) 60.9 - 47.9 %  MCV 91.9 80.0  - 100.0 fL  MCH 28.3 26.0 - 34.0 pg  MCHC 30.8 30.0 - 36.0 g/dL  RDW 83.9 (H) 88.4 - 84.4 %  Platelets 278 150 - 400 K/uL  nRBC 0.0 0.0 - 0.2 %   Comment: Performed at [facility], [address], [place], [place] [address] Basic metabolic panel with GFR     Status: Abnormal  Collection Time: 08/16/24  6:00 AM Result Value Ref Range  Sodium 135 135 - 145 mmol/L  Potassium 4.1 3.5 - 5.1 mmol/L  Chloride 97 (L) 98 - 111 mmol/L  CO2 29 22 - 32 mmol/L  Glucose, Bld 109 (H) 70 - 99 mg/dL   Comment: Glucose reference range applies only to samples taken after fasting for at least 8 hours.  BUN 30 (H) 8 - 23 mg/dL  Creatinine, Ser 8.63 (H) 0.61 - 1.24 mg/dL  Calcium  8.5 (L) 8.9 - 10.3 mg/dL  GFR, Estimated 58 (L) >60 mL/min   Comment: (NOTE) Calculated using the CKD-EPI Creatinine Equation (2021)   Anion gap 9 5 - 15   Comment: Performed at [facility], [address] [place], [place] [address] Glucose, capillary     Status: Abnormal  Collection Time: 08/16/24  8:22 AM Result Value Ref Range  Glucose-Capillary 102 (H) 70 - 99 mg/dL   Comment: Glucose reference range applies only to samples taken after fasting for at least 8 hours. Glucose, capillary     Status: Abnormal  Collection Time: 08/16/24 12:19 PM Result Value Ref Range  Glucose-Capillary 131 (H) 70 - 99 mg/dL   Comment: Glucose reference range applies only to samples taken after fasting for at least 8 hours. Glucose, capillary     Status: Abnormal  Collection Time: 08/16/24  4:08 PM Result Value Ref Range  Glucose-Capillary 182 (H) 70 - 99 mg/dL   Comment: Glucose reference range applies only to samples taken after fasting for at least 8 hours. Glucose, capillary     Status: Abnormal  Collection Time: 08/16/24  7:48 PM Result  Value Ref Range  Glucose-Capillary 202 (H) 70 - 99 mg/dL   Comment: Glucose reference range applies only to samples taken after fasting for at least 8 hours. Glucose,  capillary     Status: Abnormal  Collection Time: 08/16/24 11:42 PM Result Value Ref Range  Glucose-Capillary 174 (H) 70 - 99 mg/dL   Comment: Glucose reference range applies only to samples taken after fasting for at least 8 hours. Glucose, capillary     Status: Abnormal  Collection Time: 08/17/24  5:02 AM Result Value Ref Range  Glucose-Capillary 182 (H) 70 - 99 mg/dL   Comment: Glucose reference range applies only to samples taken after fasting for at least 8 hours. Phosphorus     Status: None  Collection Time: 08/17/24  6:58 AM Result Value Ref Range  Phosphorus 2.9 2.5 - 4.6 mg/dL   Comment: Performed at [facility], [address] [place], [place] [address] CBC     Status: Abnormal  Collection Time: 08/17/24  6:58 AM Result Value Ref Range  WBC 9.6 4.0 - 10.5 K/uL  RBC 3.45 (L) 4.22 - 5.81 MIL/uL  Hemoglobin 9.8 (L) 13.0 - 17.0 g/dL  HCT 68.0 (L) 60.9 - 47.9 %  MCV 92.5 80.0 - 100.0 fL  MCH 28.4 26.0 - 34.0 pg  MCHC 30.7 30.0 - 36.0 g/dL  RDW 84.2 (H) 88.4 - 84.4 %  Platelets 219 150 - 400 K/uL  nRBC 0.0 0.0 - 0.2 %   Comment: Performed at [facility], [address] [place], [place] [address] Basic metabolic panel     Status: Abnormal  Collection Time: 08/17/24  6:58 AM Result Value Ref Range  Sodium 135 135 - 145 mmol/L  Potassium 4.6 3.5 - 5.1 mmol/L  Chloride 97 (L) 98 - 111 mmol/L  CO2 28 22 - 32 mmol/L  Glucose, Bld 173 (H) 70 - 99 mg/dL   Comment: Glucose reference range applies only to samples taken after fasting for at least 8 hours.  BUN 30 (H) 8 - 23 mg/dL  Creatinine, Ser 8.62 (H) 0.61 - 1.24 mg/dL  Calcium  8.4 (L) 8.9 - 10.3 mg/dL  GFR, Estimated 57 (L) >60 mL/min   Comment: (NOTE) Calculated using the CKD-EPI Creatinine Equation (2021)   Anion gap 10 5 - 15   Comment: Performed at [facility], [address] [place], [place] [address] Glucose, capillary     Status: Abnormal  Collection Time: 08/17/24  8:43  AM Result Value Ref Range  Glucose-Capillary 160 (H) 70 - 99 mg/dL   Comment: Glucose reference range applies only to samples taken after fasting for at least 8 hours. Glucose, capillary     Status: Abnormal  Collection Time: 08/17/24 11:44 AM Result Value Ref Range  Glucose-Capillary 216 (H) 70 - 99 mg/dL   Comment: Glucose reference range applies only to samples taken after fasting for at least 8 hours. Glucose, capillary     Status: Abnormal  Collection Time: 08/17/24  4:45 PM Result Value Ref Range  Glucose-Capillary 186 (H) 70 - 99 mg/dL   Comment: Glucose reference range applies only to samples taken after fasting for at least 8 hours. Glucose, capillary     Status: Abnormal  Collection Time: 08/17/24  7:52 PM Result Value Ref Range  Glucose-Capillary 233 (H) 70 - 99 mg/dL   Comment: Glucose reference range applies only to samples taken after fasting for at least 8 hours. Glucose, capillary     Status: Abnormal  Collection Time: 08/17/24  10:54 PM Result Value Ref Range  Glucose-Capillary 192 (H) 70 - 99 mg/dL   Comment: Glucose reference range applies only to samples taken after fasting for at least 8 hours. Glucose, capillary     Status: Abnormal  Collection Time: 08/18/24  4:04 AM Result Value Ref Range  Glucose-Capillary 169 (H) 70 - 99 mg/dL   Comment: Glucose reference range applies only to samples taken after fasting for at least 8 hours. Hemoglobin     Status: Abnormal  Collection Time: 08/18/24  5:11 AM Result Value Ref Range  Hemoglobin 9.5 (L) 13.0 - 17.0 g/dL   Comment: Performed at [facility], [address] [place] [address] [place], [place] [address] Glucose, capillary     Status: Abnormal  Collection Time: 08/18/24  8:42 AM Result Value Ref Range  Glucose-Capillary 134 (H) 70 - 99 mg/dL   Comment: Glucose reference range applies only to samples taken after fasting for at least 8 hours. Glucose, capillary     Status: Abnormal  Collection Time: 08/18/24 11:43  AM Result Value Ref Range  Glucose-Capillary 197 (H) 70 - 99 mg/dL   Comment: Glucose reference range applies only to samples taken after fasting for at least 8 hours. Glucose, capillary     Status: Abnormal  Collection Time: 08/18/24  5:59 PM Result Value Ref Range  Glucose-Capillary 302 (H) 70 - 99 mg/dL   Comment: Glucose reference range applies only to samples taken after fasting for at least 8 hours. Glucose, capillary     Status: Abnormal  Collection Time: 08/18/24  8:51 PM Result Value Ref Range  Glucose-Capillary 226 (H) 70 - 99 mg/dL   Comment: Glucose reference range applies only to samples taken after fasting for at least 8 hours. Glucose, capillary     Status: Abnormal  Collection Time: 08/18/24 11:52 PM Result Value Ref Range  Glucose-Capillary 199 (H) 70 - 99 mg/dL   Comment: Glucose reference range applies only to samples taken after fasting for at least 8 hours. Hemoglobin     Status: Abnormal  Collection Time: 08/19/24  5:55 AM Result Value Ref Range  Hemoglobin 9.7 (L) 13.0 - 17.0 g/dL   Comment: Performed at [facility], [address] [place], [place] [address] Basic metabolic panel     Status: Abnormal  Collection Time: 08/19/24  5:55 AM Result Value Ref Range  Sodium 137 135 - 145 mmol/L  Potassium 4.4 3.5 - 5.1 mmol/L  Chloride 98 98 - 111 mmol/L  CO2 30 22 - 32 mmol/L  Glucose, Bld 154 (H) 70 - 99 mg/dL   Comment: Glucose reference range applies only to samples taken after fasting for at least 8 hours.  BUN 29 (H) 8 - 23 mg/dL  Creatinine, Ser 8.57 (H) 0.61 - 1.24 mg/dL  Calcium  8.5 (L) 8.9 - 10.3 mg/dL  GFR, Estimated 55 (L) >60 mL/min   Comment: (NOTE) Calculated using the CKD-EPI Creatinine Equation (2021)   Anion gap 9 5 - 15   Comment: Performed at [facility], [address] [place], [place] [address] Hemoglobin A1c     Status: None  Collection Time: 08/19/24  5:55 AM Result Value Ref Range  Hgb A1c MFr Bld 5.3 4.8 -  5.6 %   Comment: (NOTE)         Prediabetes: 5.7 - 6.4         Diabetes: >6.4         Glycemic control for adults with diabetes: <7.0   Mean Plasma Glucose  105 mg/dL   Comment: (NOTE) Performed At: [facility] [address] [place], [place] [id number] Jennette Shorter MD Ey:[phone]  Glucose, capillary     Status: Abnormal  Collection Time: 08/19/24  7:25 AM Result Value Ref Range  Glucose-Capillary 269 (H) 70 - 99 mg/dL   Comment: Glucose reference range applies only to samples taken after fasting for at least 8 hours. Glucose, capillary     Status: Abnormal  Collection Time: 08/19/24 12:37 PM Result Value Ref Range  Glucose-Capillary 226 (H) 70 - 99 mg/dL   Comment: Glucose reference range applies only to samples taken after fasting for at least 8 hours. Glucose, capillary     Status: Abnormal  Collection Time: 08/19/24  4:53 PM Result Value Ref Range  Glucose-Capillary 188 (H) 70 - 99 mg/dL   Comment: Glucose reference range applies only to samples taken after fasting for at least 8 hours. Glucose, capillary     Status: Abnormal  Collection Time: 08/19/24  8:52 PM Result Value Ref Range  Glucose-Capillary 173 (H) 70 - 99 mg/dL   Comment: Glucose reference range applies only to samples taken after fasting for at least 8 hours. Glucose, capillary     Status: Abnormal  Collection Time: 08/19/24 11:47 PM Result Value Ref Range  Glucose-Capillary 145 (H) 70 - 99 mg/dL   Comment: Glucose reference range applies only to samples taken after fasting for at least 8 hours. Glucose, capillary     Status: Abnormal  Collection Time: 08/20/24  4:24 AM Result Value Ref Range  Glucose-Capillary 150 (H) 70 - 99 mg/dL   Comment: Glucose reference range applies only to samples taken after fasting for at least 8 hours. Basic metabolic panel with GFR     Status: Abnormal  Collection Time: 08/20/24  6:20 AM Result Value Ref Range  Sodium 138 135 - 145 mmol/L  Potassium 4.3 3.5 -  5.1 mmol/L  Chloride 101 98 - 111 mmol/L  CO2 29 22 - 32 mmol/L  Glucose, Bld 173 (H) 70 - 99 mg/dL   Comment: Glucose reference range applies only to samples taken after fasting for at least 8 hours.  BUN 29 (H) 8 - 23 mg/dL  Creatinine, Ser 8.84 9.38 - 1.24 mg/dL  Calcium  8.4 (L) 8.9 - 10.3 mg/dL  GFR, Estimated >39 >39 mL/min   Comment: (NOTE) Calculated using the CKD-EPI Creatinine Equation (2021)   Anion gap 8 5 - 15   Comment: Performed at [facility], [address] [place], [place] [address] Hemoglobin and hematocrit, blood     Status: Abnormal  Collection Time: 08/20/24  6:20 AM Result Value Ref Range  Hemoglobin 9.4 (L) 13.0 - 17.0 g/dL  HCT 68.9 (L) 60.9 - 47.9 %   Comment: Performed at [facility], [address] [place], [place] [address] Glucose, capillary     Status: Abnormal  Collection Time: 08/20/24  8:03 AM Result Value Ref Range  Glucose-Capillary 200 (H) 70 - 99 mg/dL   Comment: Glucose reference range applies only to samples taken after fasting for at least 8 hours. Glucose, capillary     Status: Abnormal  Collection Time: 08/20/24 12:49 PM Result Value Ref Range  Glucose-Capillary 169 (H) 70 - 99 mg/dL   Comment: Glucose reference range applies only to samples taken after fasting for at least 8 hours. Glucose, capillary     Status: Abnormal  Collection Time: 08/20/24  5:23 PM Result Value Ref Range  Glucose-Capillary 303 (H) 70 - 99 mg/dL  Comment: Glucose reference range applies only to samples taken after fasting for at least 8 hours. Glucose, capillary     Status: Abnormal  Collection Time: 08/20/24  8:14 PM Result Value Ref Range  Glucose-Capillary 192 (H) 70 - 99 mg/dL   Comment: Glucose reference range applies only to samples taken after fasting for at least 8 hours. Glucose, capillary     Status: Abnormal  Collection Time: 08/20/24 11:52 PM Result Value Ref Range  Glucose-Capillary 152 (H) 70 - 99  mg/dL   Comment: Glucose reference range applies only to samples taken after fasting for at least 8 hours. Glucose, capillary     Status: Abnormal  Collection Time: 08/21/24  3:53 AM Result Value Ref Range  Glucose-Capillary 137 (H) 70 - 99 mg/dL   Comment: Glucose reference range applies only to samples taken after fasting for at least 8 hours. Hemoglobin     Status: Abnormal  Collection Time: 08/21/24  5:41 AM Result Value Ref Range  Hemoglobin 9.8 (L) 13.0 - 17.0 g/dL   Comment: Performed at [facility], [address] [place], [place] [address] Basic metabolic panel with GFR     Status: Abnormal  Collection Time: 08/21/24  5:41 AM Result Value Ref Range  Sodium 138 135 - 145 mmol/L  Potassium 4.2 3.5 - 5.1 mmol/L  Chloride 98 98 - 111 mmol/L  CO2 31 22 - 32 mmol/L  Glucose, Bld 120 (H) 70 - 99 mg/dL   Comment: Glucose reference range applies only to samples taken after fasting for at least 8 hours.  BUN 30 (H) 8 - 23 mg/dL  Creatinine, Ser 8.87 9.38 - 1.24 mg/dL  Calcium  8.2 (L) 8.9 - 10.3 mg/dL  GFR, Estimated >39 >39 mL/min   Comment: (NOTE) Calculated using the CKD-EPI Creatinine Equation (2021)   Anion gap 9 5 - 15   Comment: Performed at [facility], [address] [place], [place] [address] Glucose, capillary     Status: Abnormal  Collection Time: 08/21/24  8:43 AM Result Value Ref Range  Glucose-Capillary 112 (H) 70 - 99 mg/dL   Comment: Glucose reference range applies only to samples taken after fasting for at least 8 hours. Glucose, capillary     Status: None  Collection Time: 08/21/24 11:05 AM Result Value Ref Range  Glucose-Capillary 96 70 - 99 mg/dL   Comment: Glucose reference range applies only to samples taken after fasting for at least 8 hours. Glucose, capillary     Status: Abnormal  Collection Time: 08/21/24  4:17 PM Result Value Ref Range  Glucose-Capillary 219 (H) 70 - 99 mg/dL   Comment: Glucose reference range applies only  to samples taken after fasting for at least 8 hours. Glucose, capillary     Status: Abnormal  Collection Time: 08/21/24  8:12 PM Result Value Ref Range  Glucose-Capillary 212 (H) 70 - 99 mg/dL   Comment: Glucose reference range applies only to samples taken after fasting for at least 8 hours. Basic metabolic panel with GFR     Status: Abnormal  Collection Time: 08/22/24  7:37 AM Result Value Ref Range  Sodium 137 135 - 145 mmol/L  Potassium 4.3 3.5 - 5.1 mmol/L  Chloride 96 (L) 98 - 111 mmol/L  CO2 31 22 - 32 mmol/L  Glucose, Bld 135 (H) 70 - 99 mg/dL   Comment: Glucose reference range applies only to samples taken after fasting for at least 8 hours.  BUN 37 (H) 8 - 23 mg/dL  Creatinine,  Ser 1.22 0.61 - 1.24 mg/dL  Calcium  8.3 (L) 8.9 - 10.3 mg/dL  GFR, Estimated >39 >39 mL/min   Comment: (NOTE) Calculated using the CKD-EPI Creatinine Equation (2021)   Anion gap 10 5 - 15   Comment: Performed at [facility], [address] [place], [place] [address] CBC     Status: Abnormal  Collection Time: 08/22/24  7:37 AM Result Value Ref Range  WBC 19.6 (H) 4.0 - 10.5 K/uL  RBC 3.34 (L) 4.22 - 5.81 MIL/uL  Hemoglobin 9.6 (L) 13.0 - 17.0 g/dL  HCT 68.0 (L) 60.9 - 47.9 %  MCV 95.5 80.0 - 100.0 fL  MCH 28.7 26.0 - 34.0 pg  MCHC 30.1 30.0 - 36.0 g/dL  RDW 82.6 (H) 88.4 - 84.4 %  Platelets 257 150 - 400 K/uL  nRBC 0.0 0.0 - 0.2 %   Comment: Performed at [facility], [address] [place], [place] [address] Glucose, capillary     Status: Abnormal  Collection Time: 08/22/24  8:29 AM Result Value Ref Range  Glucose-Capillary 140 (H) 70 - 99 mg/dL   Comment: Glucose reference range applies only to samples taken after fasting for at least 8 hours. Glucose, capillary     Status: Abnormal  Collection Time: 08/22/24 11:09 AM Result Value Ref Range  Glucose-Capillary 175 (H) 70 - 99 mg/dL   Comment: Glucose reference range applies only to samples taken after fasting for  at least 8 hours. Comprehensive metabolic panel     Status: Abnormal  Collection Time: 09/16/24  9:29 AM Result Value Ref Range  Sodium 136 135 - 145 mmol/L  Potassium 3.5 3.5 - 5.1 mmol/L  Chloride 89 (L) 98 - 111 mmol/L  CO2 37 (H) 22 - 32 mmol/L  Glucose, Bld 103 (H) 70 - 99 mg/dL   Comment: Glucose reference range applies only to samples taken after fasting for at least 8 hours.  BUN 17 8 - 23 mg/dL  Creatinine, Ser 8.58 (H) 0.61 - 1.24 mg/dL  Calcium  8.4 (L) 8.9 - 10.3 mg/dL  Total Protein 7.7 6.5 - 8.1 g/dL  Albumin  3.1 (L) 3.5 - 5.0 g/dL  AST 16 15 - 41 U/L  ALT 7 0 - 44 U/L  Alkaline Phosphatase 60 38 - 126 U/L  Total Bilirubin 1.2 0.0 - 1.2 mg/dL  GFR, Estimated 55 (L) >60 mL/min   Comment: (NOTE) Calculated using the CKD-EPI Creatinine Equation (2021)   Anion gap 10 5 - 15   Comment: Performed at [facility], [address], [place], [place] [address] Troponin I (High Sensitivity)     Status: Abnormal  Collection Time: 09/16/24  9:29 AM Result Value Ref Range  Troponin I (High Sensitivity) 30 (H) <18 ng/L   Comment: (NOTE) Elevated high sensitivity troponin I (hsTnI) values and significant  changes across serial measurements may suggest ACS but many other  chronic and acute conditions are known to elevate hsTnI results.  Refer to the Links section for chest pain algorithms and additional  guidance. Performed at [facility], [address] [place], [place] [address]  CBC with Differential     Status: Abnormal  Collection Time: 09/16/24  9:29 AM Result Value Ref Range  WBC 9.0 4.0 - 10.5 K/uL  RBC 2.77 (L) 4.22 - 5.81 MIL/uL  Hemoglobin 7.6 (L) 13.0 - 17.0 g/dL  HCT 73.8 (L) 60.9 - 47.9 %  MCV 94.2 80.0 - 100.0 fL  MCH 27.4 26.0 - 34.0 pg  MCHC 29.1 (  L) 30.0 - 36.0 g/dL  RDW 83.5 (H) 88.4 - 84.4 %  Platelets 354 150 - 400 K/uL  nRBC 0.0 0.0 - 0.2 %  Neutrophils Relative % 72 %  Neutro Abs 6.5 1.7 - 7.7 K/uL  Lymphocytes  Relative 10 %  Lymphs Abs 0.9 0.7 - 4.0 K/uL  Monocytes Relative 14 %  Monocytes Absolute 1.3 (H) 0.1 - 1.0 K/uL  Eosinophils Relative 3 %  Eosinophils Absolute 0.2 0.0 - 0.5 K/uL  Basophils Relative 1 %  Basophils Absolute 0.1 0.0 - 0.1 K/uL  Immature Granulocytes 0 %  Abs Immature Granulocytes 0.03 0.00 - 0.07 K/uL   Comment: Performed at [facility], [address] [place], [place] [address] Type and screen [facility]     Status: None  Collection Time: 09/16/24  9:29 AM Result Value Ref Range  ABO/RH(D) A POS   Antibody Screen NEG   Sample Expiration 09/19/2024,2359   Unit Number [id number]   Blood Component Type RED CELLS,LR   Unit division 00   Status of Unit ISSUED,FINAL   Transfusion Status OK TO TRANSFUSE   Crossmatch Result     Compatible Performed at [facility], [address] [place], [place] [address]  Protime-INR     Status: Abnormal  Collection Time: 09/16/24  9:29 AM Result Value Ref Range  Prothrombin Time 16.1 (H) 11.4 - 15.2 seconds  INR 1.2 0.8 - 1.2   Comment: (NOTE) INR goal varies based on device and disease states. Performed at [facility], [address] [place], [place] [address]  Brain natriuretic peptide     Status: Abnormal  Collection Time: 09/16/24  9:29 AM Result Value Ref Range  B Natriuretic Peptide 1,583.6 (H) 0.0 - 100.0 pg/mL   Comment: Performed at [facility], [address] [place], [place] [address] BPAM RBC     Status: None  Collection Time: 09/16/24  9:29 AM Result Value Ref Range  ISSUE DATE / TIME [id number]   Blood Product Unit Number [id number]   PRODUCT CODE [id number]   Unit Type and Rh 0600   Blood Product Expiration Date [id number]  Prepare RBC (crossmatch)     Status: None  Collection Time: 09/16/24 11:30 AM Result Value Ref Range  Order Confirmation     ORDER PROCESSED BY BLOOD BANK Performed at [facility], [address], [place], [place] [address]  Troponin I (High Sensitivity)     Status: Abnormal  Collection Time: 09/16/24 11:46 AM Result Value Ref Range  Troponin I (High Sensitivity) 29 (H) <18 ng/L   Comment: (NOTE) Elevated high sensitivity troponin I (hsTnI) values and significant  changes across serial measurements may suggest ACS but many other  chronic and acute conditions are known to elevate hsTnI results.  Refer to the Links section for chest pain algorithms and additional  guidance. Performed at [facility], [address] [place], [place] [address]  Urinalysis, w/ Reflex to Culture (Infection Suspected) -Urine, Clean Catch     Status: Abnormal  Collection Time: 09/16/24 12:02 PM Result Value Ref Range  Specimen Source URINE, CLEAN CATCH   Color, Urine YELLOW (A) YELLOW  APPearance CLEAR (A) CLEAR  Specific Gravity, Urine 1.014 1.005 - 1.030  pH 7.0 5.0 - 8.0  Glucose, UA 150 (A) NEGATIVE mg/dL  Hgb urine dipstick NEGATIVE NEGATIVE  Bilirubin Urine NEGATIVE NEGATIVE  Ketones, ur NEGATIVE NEGATIVE mg/dL  Protein, ur NEGATIVE NEGATIVE mg/dL  Nitrite NEGATIVE NEGATIVE  Leukocytes,Ua  NEGATIVE NEGATIVE  RBC / HPF 6-10 0 - 5 RBC/hpf  WBC, UA 0-5 0 - 5 WBC/hpf   Comment:        Reflex urine culture not performed if WBC <=10, OR if Squamous epithelial cells >5. If Squamous epithelial cells >5 suggest recollection.   Bacteria, UA RARE (A) NONE SEEN  Squamous Epithelial / HPF 0 0 - 5 /HPF   Comment: Performed at [facility], [address] [place], [place] [address] Basic metabolic panel     Status: Abnormal  Collection Time: 09/17/24  5:14 AM Result Value Ref Range  Sodium 137 135 - 145 mmol/L  Potassium 3.3 (L) 3.5 - 5.1 mmol/L  Chloride 89 (L) 98 - 111 mmol/L  CO2 35 (H) 22 - 32 mmol/L  Glucose, Bld 122 (H) 70 - 99 mg/dL   Comment: Glucose reference range applies only to samples taken after fasting for at least 8 hours.  BUN 20 8 - 23  mg/dL  Creatinine, Ser 8.56 (H) 0.61 - 1.24 mg/dL  Calcium  8.0 (L) 8.9 - 10.3 mg/dL  GFR, Estimated 54 (L) >60 mL/min   Comment: (NOTE) Calculated using the CKD-EPI Creatinine Equation (2021)   Anion gap 13 5 - 15   Comment: Performed at [facility], [address] [place], [place] [address] CBC     Status: Abnormal  Collection Time: 09/17/24  5:14 AM Result Value Ref Range  WBC 12.1 (H) 4.0 - 10.5 K/uL  RBC 2.95 (L) 4.22 - 5.81 MIL/uL  Hemoglobin 8.1 (L) 13.0 - 17.0 g/dL  HCT 72.8 (L) 60.9 - 47.9 %  MCV 91.9 80.0 - 100.0 fL  MCH 27.5 26.0 - 34.0 pg  MCHC 29.9 (L) 30.0 - 36.0 g/dL  RDW 83.0 (H) 88.4 - 84.4 %  Platelets 365 150 - 400 K/uL  nRBC 0.0 0.0 - 0.2 %   Comment: Performed at [facility], [address] [place], [place] [address] CBC     Status: Abnormal  Collection Time: 09/18/24  3:57 AM Result Value Ref Range  WBC 9.8 4.0 - 10.5 K/uL  RBC 2.78 (L) 4.22 - 5.81 MIL/uL  Hemoglobin 7.6 (L) 13.0 - 17.0 g/dL  HCT 73.8 (L) 60.9 - 47.9 %  MCV 93.9 80.0 - 100.0 fL  MCH 27.3 26.0 - 34.0 pg  MCHC 29.1 (L) 30.0 - 36.0 g/dL  RDW 83.4 (H) 88.4 - 84.4 %  Platelets 365 150 - 400 K/uL  nRBC 0.0 0.0 - 0.2 %   Comment: Performed at [facility], [address] [place], [place] [address] Basic metabolic panel with GFR     Status: Abnormal  Collection Time: 09/18/24  3:57 AM Result Value Ref Range  Sodium 136 135 - 145 mmol/L  Potassium 3.7 3.5 - 5.1 mmol/L  Chloride 91 (L) 98 - 111 mmol/L  CO2 35 (H) 22 - 32 mmol/L  Glucose, Bld 153 (H) 70 - 99 mg/dL   Comment: Glucose reference range applies only to samples taken after fasting for at least 8 hours.  BUN 22 8 - 23 mg/dL  Creatinine, Ser 8.32 (H) 0.61 - 1.24 mg/dL  Calcium  8.1 (L) 8.9 - 10.3 mg/dL  GFR, Estimated 45 (L) >60 mL/min   Comment: (NOTE) Calculated using the CKD-EPI Creatinine Equation (2021)   Anion gap 10 5 - 15   Comment: Performed at [facility], [address], [place], [place] [address] CBC     Status: Abnormal  Collection Time: 09/19/24  4:36 AM Result Value Ref Range  WBC 9.9 4.0 - 10.5 K/uL  RBC 2.64 (L) 4.22 - 5.81 MIL/uL  Hemoglobin 7.3 (L) 13.0 - 17.0 g/dL  HCT 75.2 (L) 60.9 - 47.9 %  MCV 93.6 80.0 - 100.0 fL  MCH 27.7 26.0 - 34.0 pg  MCHC 29.6 (L) 30.0 - 36.0 g/dL  RDW 83.4 (H) 88.4 - 84.4 %  Platelets 360 150 - 400 K/uL  nRBC 0.0 0.0 - 0.2 %   Comment: Performed at [facility], [address], [place], [place] [address] Basic metabolic panel with GFR     Status: Abnormal  Collection Time: 09/19/24  4:36 AM Result Value Ref Range  Sodium 136 135 - 145 mmol/L  Potassium 3.8 3.5 - 5.1 mmol/L  Chloride 91 (L) 98 - 111 mmol/L  CO2 36 (H) 22 - 32 mmol/L  Glucose, Bld 136 (H) 70 - 99 mg/dL   Comment: Glucose reference range applies only to samples taken after fasting for at least 8 hours.  BUN 21 8 - 23 mg/dL  Creatinine, Ser 8.43 (H) 0.61 - 1.24 mg/dL  Calcium  8.2 (L) 8.9 - 10.3 mg/dL  GFR, Estimated 49 (L) >60 mL/min   Comment: (NOTE) Calculated using the CKD-EPI Creatinine Equation (2021)   Anion gap 9 5 - 15   Comment: Performed at [facility], [address] [place], [place] [address]  *Note: Due to a large number of results and/or encounters for the requested time period, some results have not been displayed. A complete set of results can be found in Results Review.   Radiology DG Chest 2 View Result Date: 09/29/2024 CLINICAL DATA:  Chest pain. EXAM: CHEST - 2 VIEW COMPARISON:  September 21, 2024 FINDINGS: Multiple sternal wires are present. The cardiac silhouette is mildly enlarged and unchanged in size. There is prominence of the pulmonary vasculature. Increased interstitial lung markings are again seen. A stable nodular opacity is seen overlying the lateral aspect of the mid to upper right lung. Small bilateral pleural effusions are noted. No pneumothorax is identified. The visualized skeletal structures  are unremarkable. IMPRESSION: 1. Cardiomegaly with mild pulmonary vascular congestion and small bilateral pleural effusions. 2. Stable nodular opacity overlying the lateral aspect of the mid to upper right lung which corresponds to findings seen within this region on prior chest CTA (dated July 11, 2024). Electronically Signed   By: Suzen Dials M.D.   On: 09/29/2024 11:58   DG Chest Port 1 View Result Date: 09/21/2024 EXAM: 1 VIEW(S) XRAY OF THE CHEST 09/21/2024 12:40:32 PM COMPARISON: 09/16/2024 CLINICAL HISTORY: Dyspnea. FINDINGS: LUNGS AND PLEURA: Mild cardiopulmonary vascular congestion and edema similar to prior radiograph. Pneumonia is not excluded. The previously seen right upper lobe nodule is poorly visualized on today's exam. Small bilateral pleural effusions. No pneumothorax. No focal pulmonary opacity. HEART AND MEDIASTINUM: Mild cardiopulmonary vascular congestion and edema similar to prior radiograph. Median sternotomy wires. No acute abnormality of the cardiac and mediastinal silhouettes. BONES AND SOFT TISSUES: Median sternotomy wires. No acute osseous abnormality. IMPRESSION: 1. Mild cardiopulmonary vascular congestion and edema, similar to prior radiograph 2. Small bilateral pleural effusions Electronically signed by: Vanetta Chou MD 09/21/2024 02:27 PM EDT RP Workstation: [id number]   US  ARTERIAL LOWER EXTREMITY DUPLEX BILATERAL Result Date: 09/17/2024 CLINICAL DATA:  Lower extremity claudication, weak Doppler pulses EXAM: BILATERAL LOWER EXTREMITY ARTERIAL DUPLEX SCAN TECHNIQUE: Gray-scale sonography as well as color Doppler and duplex ultrasound was performed to evaluate the arteries of both lower extremities  including the common, superficial and profunda femoral arteries, popliteal artery and calf arteries. COMPARISON:  None Available. FINDINGS: Right Lower Extremity ABI: 0.85 Inflow: Normal common femoral arterial waveforms and velocities. No evidence of inflow (aortoiliac)  disease. Outflow: Extensive atherosclerotic plaque in the common and superficial femoral artery. There appears to be a short segment occlusion of the proximal SFA with collateral reconstitution. Flow is minimal in the mid thigh. Runoff: Normal posterior and anterior tibial arterial waveforms and velocities. Vessels are patent to the ankle. Left Lower Extremity ABI: 0.84 Inflow: Normal common femoral arterial waveforms and velocities. No evidence of inflow (aortoiliac) disease. Outflow: Extensive calcified atherosclerotic plaque throughout the femoral system. Shadowing obscures the artery in several places. Suspect superficial femoral artery disease. Runoff: Normal posterior and anterior tibial arterial waveforms and velocities. Vessels are patent to the ankle. IMPRESSION: 1. Abnormal bilateral resting ankle-brachial indices consistent with at least mild peripheral arterial disease. 2. Extensive calcified atherosclerotic plaque throughout the superficial femoral arteries bilaterally. Suspect significant stenosis versus focal occlusion of the SFAs bilaterally. Electronically Signed   By: Wilkie Lent M.D.   On: 09/17/2024 15:58   US  ARTERIAL ABI (SCREENING LOWER EXTREMITY) Result Date: 09/17/2024 CLINICAL DATA:  ABI screening in a patient history of hyperlipidemia,, hypertension smoking, and previous vascular surgery EXAM: NONINVASIVE PHYSIOLOGIC VASCULAR STUDY OF BILATERAL LOWER EXTREMITIES TECHNIQUE: Evaluation of both lower extremities were performed at rest, including calculation of ankle-brachial indices with single level Doppler, pressure and pulse volume recording. COMPARISON:  None Available. FINDINGS: Right ABI:  0.85 Left ABI:  0.84 Right Lower Extremity:  Biphasic arterial waveforms at the ankle. Left Lower Extremity:  Biphasic arterial waveforms at the ankle. IMPRESSION: Mild PVD bilateral lower extremities Electronically Signed   By: Cordella Banner   On: 09/17/2024 15:13      Radiology DG  Chest 2 View Result Date: 09/29/2024 CLINICAL DATA:  Chest pain. EXAM: CHEST - 2 VIEW COMPARISON:  September 21, 2024 FINDINGS: Multiple sternal wires are present. The cardiac silhouette is mildly enlarged and unchanged in size. There is prominence of the pulmonary vasculature. Increased interstitial lung markings are again seen. A stable nodular opacity is seen overlying the lateral aspect of the mid to upper right lung. Small bilateral pleural effusions are noted. No pneumothorax is identified. The visualized skeletal structures are unremarkable. IMPRESSION: 1. Cardiomegaly with mild pulmonary vascular congestion and small bilateral pleural effusions. 2. Stable nodular opacity overlying the lateral aspect of the mid to upper right lung which corresponds to findings seen within this region on prior chest CTA (dated July 11, 2024). Electronically Signed   By: Suzen Dials M.D.   On: 09/29/2024 11:58   DG Chest Port 1 View Result Date: 09/21/2024 EXAM: 1 VIEW(S) XRAY OF THE CHEST 09/21/2024 12:40:32 PM COMPARISON: 09/16/2024 CLINICAL HISTORY: Dyspnea. FINDINGS: LUNGS AND PLEURA: Mild cardiopulmonary vascular congestion and edema similar to prior radiograph. Pneumonia is not excluded. The previously seen right upper lobe nodule is poorly visualized on today's exam. Small bilateral pleural effusions. No pneumothorax. No focal pulmonary opacity. HEART AND MEDIASTINUM: Mild cardiopulmonary vascular congestion and edema similar to prior radiograph. Median sternotomy wires. No acute abnormality of the cardiac and mediastinal silhouettes. BONES AND SOFT TISSUES: Median sternotomy wires. No acute osseous abnormality. IMPRESSION: 1. Mild cardiopulmonary vascular congestion and edema, similar to prior radiograph 2. Small bilateral pleural effusions Electronically signed by: Vanetta Chou MD 09/21/2024 02:27 PM EDT RP Workstation: [id number]   US  ARTERIAL LOWER EXTREMITY DUPLEX BILATERAL Result Date:  09/17/2024  CLINICAL DATA:  Lower extremity claudication, weak Doppler pulses EXAM: BILATERAL LOWER EXTREMITY ARTERIAL DUPLEX SCAN TECHNIQUE: Gray-scale sonography as well as color Doppler and duplex ultrasound was performed to evaluate the arteries of both lower extremities including the common, superficial and profunda femoral arteries, popliteal artery and calf arteries. COMPARISON:  None Available. FINDINGS: Right Lower Extremity ABI: 0.85 Inflow: Normal common femoral arterial waveforms and velocities. No evidence of inflow (aortoiliac) disease. Outflow: Extensive atherosclerotic plaque in the common and superficial femoral artery. There appears to be a short segment occlusion of the proximal SFA with collateral reconstitution. Flow is minimal in the mid thigh. Runoff: Normal posterior and anterior tibial arterial waveforms and velocities. Vessels are patent to the ankle. Left Lower Extremity ABI: 0.84 Inflow: Normal common femoral arterial waveforms and velocities. No evidence of inflow (aortoiliac) disease. Outflow: Extensive calcified atherosclerotic plaque throughout the femoral system. Shadowing obscures the artery in several places. Suspect superficial femoral artery disease. Runoff: Normal posterior and anterior tibial arterial waveforms and velocities. Vessels are patent to the ankle. IMPRESSION: 1. Abnormal bilateral resting ankle-brachial indices consistent with at least mild peripheral arterial disease. 2. Extensive calcified atherosclerotic plaque throughout the superficial femoral arteries bilaterally. Suspect significant stenosis versus focal occlusion of the SFAs bilaterally. Electronically Signed   By: Wilkie Lent M.D.   On: 09/17/2024 15:58   US  ARTERIAL ABI (SCREENING LOWER EXTREMITY) Result Date: 09/17/2024 CLINICAL DATA:  ABI screening in a patient history of hyperlipidemia,, hypertension smoking, and previous vascular surgery EXAM: NONINVASIVE PHYSIOLOGIC VASCULAR STUDY OF BILATERAL  LOWER EXTREMITIES TECHNIQUE: Evaluation of both lower extremities were performed at rest, including calculation of ankle-brachial indices with single level Doppler, pressure and pulse volume recording. COMPARISON:  None Available. FINDINGS: Right ABI:  0.85 Left ABI:  0.84 Right Lower Extremity:  Biphasic arterial waveforms at the ankle. Left Lower Extremity:  Biphasic arterial waveforms at the ankle. IMPRESSION: Mild PVD bilateral lower extremities Electronically Signed   By: Cordella Banner   On: 09/17/2024 15:13    Labs Recent Results (from the past 2160 hours) Glucose, capillary     Status: Abnormal  Collection Time: 07/19/24 11:52 AM Result Value Ref Range  Glucose-Capillary 122 (H) 70 - 99 mg/dL   Comment: Glucose reference range applies only to samples taken after fasting for at least 8 hours.  Comment 1 Notify RN  Glucose, capillary     Status: Abnormal  Collection Time: 07/19/24  4:25 PM Result Value Ref Range  Glucose-Capillary 154 (H) 70 - 99 mg/dL   Comment: Glucose reference range applies only to samples taken after fasting for at least 8 hours.  Comment 1 Notify RN  Glucose, capillary     Status: Abnormal  Collection Time: 07/19/24  7:43 PM Result Value Ref Range  Glucose-Capillary 141 (H) 70 - 99 mg/dL   Comment: Glucose reference range applies only to samples taken after fasting for at least 8 hours. Glucose, capillary     Status: Abnormal  Collection Time: 07/19/24 11:40 PM Result Value Ref Range  Glucose-Capillary 137 (H) 70 - 99 mg/dL   Comment: Glucose reference range applies only to samples taken after fasting for at least 8 hours. Glucose, capillary     Status: Abnormal  Collection Time: 07/20/24  3:05 AM Result Value Ref Range  Glucose-Capillary 159 (H) 70 - 99 mg/dL   Comment: Glucose reference range applies only to samples taken after fasting for at least 8 hours. CBC     Status: Abnormal  Collection Time: 07/20/24  5:31 AM Result Value Ref Range  WBC 12.8  (H) 4.0 - 10.5 K/uL  RBC 3.40 (L) 4.22 - 5.81 MIL/uL  Hemoglobin 9.2 (L) 13.0 - 17.0 g/dL  HCT 68.3 (L) 60.9 - 47.9 %  MCV 92.9 80.0 - 100.0 fL  MCH 27.1 26.0 - 34.0 pg  MCHC 29.1 (L) 30.0 - 36.0 g/dL  RDW 83.4 (H) 88.4 - 84.4 %  Platelets 229 150 - 400 K/uL  nRBC 0.0 0.0 - 0.2 %   Comment: Performed at [facility], [address] [place], [place] [address] Renal function panel     Status: Abnormal  Collection Time: 07/20/24  5:31 AM Result Value Ref Range  Sodium 147 (H) 135 - 145 mmol/L  Potassium 4.4 3.5 - 5.1 mmol/L  Chloride 109 98 - 111 mmol/L  CO2 29 22 - 32 mmol/L  Glucose, Bld 178 (H) 70 - 99 mg/dL   Comment: Glucose reference range applies only to samples taken after fasting for at least 8 hours.  BUN 27 (H) 8 - 23 mg/dL  Creatinine, Ser 8.98 9.38 - 1.24 mg/dL  Calcium  8.6 (L) 8.9 - 10.3 mg/dL  Phosphorus 3.1 2.5 - 4.6 mg/dL  Albumin  3.0 (L) 3.5 - 5.0 g/dL  GFR, Estimated >39 >39 mL/min   Comment: (NOTE) Calculated using the CKD-EPI Creatinine Equation (2021)   Anion gap 9 5 - 15   Comment: Performed at [facility], [address] [place], [place] [address] Glucose, capillary     Status: Abnormal  Collection Time: 07/20/24  7:46 AM Result Value Ref Range  Glucose-Capillary 150 (H) 70 - 99 mg/dL   Comment: Glucose reference range applies only to samples taken after fasting for at least 8 hours. Glucose, capillary     Status: Abnormal  Collection Time: 07/20/24 11:14 AM Result Value Ref Range  Glucose-Capillary 158 (H) 70 - 99 mg/dL   Comment: Glucose reference range applies only to samples taken after fasting for at least 8 hours. Glucose, capillary     Status: Abnormal  Collection Time: 07/20/24  4:44 PM Result Value Ref Range  Glucose-Capillary 135 (H) 70 - 99 mg/dL   Comment: Glucose reference range applies only to samples taken after fasting for at least 8 hours. Glucose, capillary     Status: Abnormal  Collection Time: 07/20/24   7:37 PM Result Value Ref Range  Glucose-Capillary 141 (H) 70 - 99 mg/dL   Comment: Glucose reference range applies only to samples taken after fasting for at least 8 hours. Glucose, capillary     Status: Abnormal  Collection Time: 07/20/24 11:51 PM Result Value Ref Range  Glucose-Capillary 188 (H) 70 - 99 mg/dL   Comment: Glucose reference range applies only to samples taken after fasting for at least 8 hours. Glucose, capillary     Status: Abnormal  Collection Time: 07/21/24  4:00 AM Result Value Ref Range  Glucose-Capillary 176 (H) 70 - 99 mg/dL   Comment: Glucose reference range applies only to samples taken after fasting for at least 8 hours. CBC     Status: Abnormal  Collection Time: 07/21/24  4:55 AM Result Value Ref Range  WBC 10.5 4.0 - 10.5 K/uL  RBC 2.91 (L) 4.22 - 5.81 MIL/uL  Hemoglobin 8.0 (L) 13.0 - 17.0 g/dL  HCT 73.0 (L) 60.9 - 47.9 %  MCV 92.4 80.0 - 100.0 fL  MCH 27.5 26.0 - 34.0 pg  MCHC 29.7 (L) 30.0 - 36.0 g/dL  RDW 83.1 (H) 88.4 - 84.4 %  Platelets 211 150 - 400 K/uL  nRBC 0.0 0.0 - 0.2 %   Comment: Performed at [facility], [address] [place], [place] [address] Renal function panel     Status: Abnormal  Collection Time: 07/21/24  4:55 AM Result Value Ref Range  Sodium 145 135 - 145 mmol/L  Potassium 4.0 3.5 - 5.1 mmol/L  Chloride 105 98 - 111 mmol/L  CO2 33 (H) 22 - 32 mmol/L  Glucose, Bld 193 (H) 70 - 99 mg/dL   Comment: Glucose reference range applies only to samples taken after fasting for at least 8 hours.  BUN 33 (H) 8 - 23 mg/dL  Creatinine, Ser 8.84 9.38 - 1.24 mg/dL  Calcium  8.4 (L) 8.9 - 10.3 mg/dL  Phosphorus 3.4 2.5 - 4.6 mg/dL  Albumin  2.7 (L) 3.5 - 5.0 g/dL  GFR, Estimated >39 >39 mL/min   Comment: (NOTE) Calculated using the CKD-EPI Creatinine Equation (2021)   Anion gap 7 5 - 15   Comment: Performed at [facility], [address] [place], [place] [address] Glucose, capillary     Status:  Abnormal  Collection Time: 07/21/24  7:39 AM Result Value Ref Range  Glucose-Capillary 163 (H) 70 - 99 mg/dL   Comment: Glucose reference range applies only to samples taken after fasting for at least 8 hours. Glucose, capillary     Status: Abnormal  Collection Time: 07/21/24 12:02 PM Result Value Ref Range  Glucose-Capillary 169 (H) 70 - 99 mg/dL   Comment: Glucose reference range applies only to samples taken after fasting for at least 8 hours. Prepare RBC (crossmatch)     Status: None  Collection Time: 07/21/24 12:04 PM Result Value Ref Range  Order Confirmation     ORDER PROCESSED BY BLOOD BANK Performed at [facility], [address] [place], [place] [address]  Type and screen [facility]     Status: None  Collection Time: 07/21/24 12:36 PM Result Value Ref Range  ABO/RH(D) A POS   Antibody Screen NEG   Sample Expiration 07/24/2024,2359   Unit Number [id number]   Blood Component Type RED CELLS,LR   Unit division 00   Status of Unit ISSUED,FINAL   Transfusion Status OK TO TRANSFUSE   Crossmatch Result     Compatible Performed at [facility], [address] [place], [place] [address]  BPAM RBC     Status: None  Collection Time: 07/21/24 12:36 PM Result Value Ref Range  ISSUE DATE / TIME [id number]   Blood Product Unit Number [id number]   PRODUCT CODE [id number]   Unit Type and Rh 6200   Blood Product Expiration Date [id number]  Glucose, capillary     Status: Abnormal  Collection Time: 07/21/24  3:26 PM Result Value Ref Range  Glucose-Capillary 156 (H) 70 - 99 mg/dL   Comment: Glucose reference range applies only to samples taken after fasting for at least 8 hours. Glucose, capillary     Status: Abnormal  Collection Time: 07/21/24  7:43 PM Result Value Ref Range  Glucose-Capillary 134 (H) 70 - 99 mg/dL   Comment: Glucose reference range applies only to samples taken after fasting for at least 8 hours. Glucose,  capillary     Status: Abnormal  Collection Time: 07/21/24 10:45 PM Result Value Ref Range  Glucose-Capillary 158 (H) 70 - 99 mg/dL   Comment: Glucose reference range applies only to samples taken after fasting for at least 8 hours. Glucose, capillary     Status: Abnormal  Collection Time: 07/22/24  3:54 AM Result Value Ref Range  Glucose-Capillary 163 (H) 70 - 99 mg/dL   Comment: Glucose reference range applies only to samples taken after fasting for at least 8 hours. Basic metabolic panel with GFR     Status: Abnormal  Collection Time: 07/22/24  4:16 AM Result Value Ref Range  Sodium 146 (H) 135 - 145 mmol/L  Potassium 3.9 3.5 - 5.1 mmol/L  Chloride 104 98 - 111 mmol/L  CO2 33 (H) 22 - 32 mmol/L  Glucose, Bld 186 (H) 70 - 99 mg/dL   Comment: Glucose reference range applies only to samples taken after fasting for at least 8 hours.  BUN 36 (H) 8 - 23 mg/dL  Creatinine, Ser 9.02 9.38 - 1.24 mg/dL  Calcium  8.6 (L) 8.9 - 10.3 mg/dL  GFR, Estimated >39 >39 mL/min   Comment: (NOTE) Calculated using the CKD-EPI Creatinine Equation (2021)   Anion gap 9 5 - 15   Comment: Performed at [facility], [address] [place], [place] [address] CBC     Status: Abnormal  Collection Time: 07/22/24  4:16 AM Result Value Ref Range  WBC 14.0 (H) 4.0 - 10.5 K/uL  RBC 3.09 (L) 4.22 - 5.81 MIL/uL  Hemoglobin 8.6 (L) 13.0 - 17.0 g/dL  HCT 71.6 (L) 60.9 - 47.9 %  MCV 91.6 80.0 - 100.0 fL  MCH 27.8 26.0 - 34.0 pg  MCHC 30.4 30.0 - 36.0 g/dL  RDW 83.1 (H) 88.4 - 84.4 %  Platelets 247 150 - 400 K/uL  nRBC 0.0 0.0 - 0.2 %   Comment: Performed at [facility], [address] [place], [place] [address] Glucose, capillary     Status: Abnormal  Collection Time: 07/22/24  7:20 AM Result Value Ref Range  Glucose-Capillary 162 (H) 70 - 99 mg/dL   Comment: Glucose reference range applies only to samples taken after fasting for at least 8 hours. Glucose, capillary     Status:  Abnormal  Collection Time: 07/22/24 11:32 AM Result Value Ref Range  Glucose-Capillary 124 (H) 70 - 99 mg/dL   Comment: Glucose reference range applies only to samples taken after fasting for at least 8 hours. Glucose, capillary     Status: Abnormal  Collection Time: 07/22/24  3:13 PM Result Value Ref Range  Glucose-Capillary 123 (H) 70 - 99 mg/dL   Comment: Glucose reference range applies only to samples taken after fasting for at least 8 hours. Glucose, capillary     Status: Abnormal  Collection Time: 07/22/24  7:37 PM Result Value Ref Range  Glucose-Capillary 134 (H) 70 - 99 mg/dL   Comment: Glucose reference range applies only to samples taken after fasting for at least 8 hours. Glucose, capillary     Status: Abnormal  Collection Time: 07/22/24 11:12 PM Result Value Ref Range  Glucose-Capillary 134 (H) 70 - 99 mg/dL   Comment: Glucose reference range applies only to samples taken after fasting for at least 8 hours. Glucose, capillary     Status: Abnormal  Collection Time: 07/23/24  3:18 AM Result Value Ref Range  Glucose-Capillary 118 (H) 70 - 99 mg/dL   Comment: Glucose reference range applies only to samples taken after fasting for at least 8 hours. Basic metabolic panel with GFR     Status: Abnormal  Collection Time: 07/23/24  4:57 AM Result Value Ref Range  Sodium 142 135 - 145 mmol/L  Potassium 4.1 3.5 - 5.1 mmol/L  Chloride 101 98 - 111 mmol/L  CO2 32 22 - 32 mmol/L  Glucose, Bld 138 (H) 70 - 99 mg/dL   Comment: Glucose reference range applies only to samples taken after fasting for at least 8 hours.  BUN 37 (H) 8 - 23 mg/dL  Creatinine, Ser 8.87 9.38 - 1.24 mg/dL  Calcium  8.6 (L) 8.9 - 10.3 mg/dL  GFR, Estimated >39 >39 mL/min   Comment: (NOTE) Calculated using the CKD-EPI Creatinine Equation (2021)   Anion gap 9 5 - 15   Comment: Performed at [facility], [address] [place], [place] [address] CBC     Status: Abnormal  Collection Time: 07/23/24   4:57 AM Result Value Ref Range  WBC 18.8 (H) 4.0 - 10.5 K/uL  RBC 3.35 (L) 4.22 - 5.81 MIL/uL  Hemoglobin 9.3 (L) 13.0 - 17.0 g/dL  HCT 68.9 (L) 60.9 - 47.9 %  MCV 92.5 80.0 - 100.0 fL  MCH 27.8 26.0 - 34.0 pg  MCHC 30.0 30.0 - 36.0 g/dL  RDW 82.7 (H) 88.4 - 84.4 %  Platelets 247 150 - 400 K/uL  nRBC 0.0 0.0 - 0.2 %   Comment: Performed at [facility], [address] [place], [place] [address] Magnesium      Status: Abnormal  Collection Time: 07/23/24  4:57 AM Result Value Ref Range  Magnesium  2.7 (H) 1.7 - 2.4 mg/dL   Comment: Performed at [facility], [address], [place], [place] [address] Phosphorus     Status: None  Collection Time: 07/23/24  4:57 AM Result Value Ref Range  Phosphorus 4.6 2.5 - 4.6 mg/dL   Comment: Performed at [facility], [address] [place], [place] [address] Procalcitonin     Status: None  Collection Time: 07/23/24  4:57 AM Result Value Ref Range  Procalcitonin 0.26 ng/mL   Comment:        Interpretation: PCT (Procalcitonin) <= 0.5 ng/mL: Systemic infection (sepsis) is not likely. Local bacterial infection is possible. (NOTE)       Sepsis PCT Algorithm           Lower Respiratory Tract                                      Infection PCT Algorithm    ----------------------------     ----------------------------         PCT < 0.25 ng/mL                PCT < 0.10 ng/mL          Strongly encourage             Strongly discourage   discontinuation of antibiotics    initiation of antibiotics    ----------------------------     -----------------------------       PCT 0.25 - 0.50 ng/mL            PCT 0.10 - 0.25 ng/mL               OR       >80% decrease in PCT            Discourage initiation of                                            antibiotics      Encourage  discontinuation           of antibiotics    ----------------------------     -----------------------------         PCT >= 0.50 ng/mL              PCT  0.26 - 0.50 ng/mL               AND        <80% decrease in PCT             Encourage initiation of                                             antibiotics       Encourage continuation           of antibiotics    ----------------------------     -----------------------------        PCT >= 0.50 ng/mL                  PCT > 0.50 ng/mL               AND         increase in PCT                  Strongly encourage                                      initiation of antibiotics    Strongly encourage escalation           of antibiotics                                     -----------------------------                                           PCT <= 0.25 ng/mL                                                 OR                                        > 80% decrease in PCT                                      Discontinue / Do not initiate                                             antibiotics  Performed at [facility], [address] [place], [place] [address]  Glucose, capillary     Status: Abnormal  Collection Time: 07/23/24  7:44 AM Result Value Ref Range  Glucose-Capillary 121 (H) 70 - 99 mg/dL   Comment: Glucose reference range applies only to samples taken after fasting for at least 8 hours. Culture, Respiratory w Gram Stain     Status: None  Collection Time: 07/23/24 11:32 AM  Specimen: Sputum Result Value Ref Range  Specimen Description     SPU Performed at [facility], [address], [place], [place] [address]   Special Requests     NONE Performed at [facility], [address] [place], [place] [address]   Gram Stain     RARE WBC PRESENT, PREDOMINANTLY PMN RARE GRAM POSITIVE COCCI   Culture     Normal respiratory flora-no Staph aureus or Pseudomonas seen Performed at [facility], [address], [place], [place] [address]   Report Status 07/25/2024 FINAL  Glucose, capillary     Status: Abnormal  Collection Time: 07/23/24  11:46 AM Result Value Ref Range  Glucose-Capillary 118 (H) 70 - 99 mg/dL   Comment: Glucose reference range applies only to samples taken after fasting for at least 8 hours. Glucose, capillary     Status: Abnormal  Collection Time: 07/23/24  3:34 PM Result Value Ref Range  Glucose-Capillary 103 (H) 70 - 99 mg/dL   Comment: Glucose reference range applies only to samples taken after fasting for at least 8 hours. Glucose, capillary     Status: Abnormal  Collection Time: 07/23/24  7:17 PM Result Value Ref Range  Glucose-Capillary 115 (H) 70 - 99 mg/dL   Comment: Glucose reference range applies only to samples taken after fasting for at least 8 hours. Glucose, capillary     Status: Abnormal  Collection Time: 07/24/24 12:18 AM Result Value Ref Range  Glucose-Capillary 147 (H) 70 - 99 mg/dL   Comment: Glucose reference range applies only to samples taken after fasting for at least 8 hours. CBC     Status: Abnormal  Collection Time: 07/24/24  4:06 AM Result Value Ref Range  WBC 15.7 (H) 4.0 - 10.5 K/uL  RBC 2.93 (L) 4.22 - 5.81 MIL/uL  Hemoglobin 8.1 (L) 13.0 - 17.0 g/dL  HCT 72.4 (L) 60.9 - 47.9 %  MCV 93.9 80.0 - 100.0 fL  MCH 27.6 26.0 - 34.0 pg  MCHC 29.5 (L) 30.0 - 36.0 g/dL  RDW 82.4 (H) 88.4 - 84.4 %  Platelets 246 150 - 400 K/uL  nRBC 0.0 0.0 - 0.2 %   Comment: Performed at [facility], [address] [place], [place] [address] Renal function panel     Status: Abnormal  Collection Time: 07/24/24  4:06 AM Result Value Ref Range  Sodium 141 135 - 145 mmol/L  Potassium 4.3 3.5 - 5.1 mmol/L  Chloride 99 98 - 111 mmol/L  CO2 33 (H) 22 - 32 mmol/L  Glucose, Bld 160 (H) 70 - 99 mg/dL   Comment: Glucose reference range applies only to samples taken after fasting for at least 8 hours.  BUN 48 (H) 8 - 23 mg/dL  Creatinine, Ser 8.94 9.38 - 1.24 mg/dL  Calcium  8.7 (L) 8.9 - 10.3 mg/dL  Phosphorus 4.9 (H) 2.5 - 4.6 mg/dL  Albumin  2.9 (L) 3.5 - 5.0 g/dL  GFR,  Estimated >39 >39 mL/min   Comment: (NOTE) Calculated using the CKD-EPI Creatinine Equation (2021)   Anion gap 9 5 - 15   Comment: Performed at [facility], [address] [place], [place] [address] Troponin I (High Sensitivity)     Status: Abnormal  Collection Time:  07/24/24  4:06 AM Result Value Ref Range  Troponin I (High Sensitivity) 72 (H) <18 ng/L   Comment: (NOTE) Elevated high sensitivity troponin I (hsTnI) values and significant  changes across serial measurements may suggest ACS but many other  chronic and acute conditions are known to elevate hsTnI results.  Refer to the Links section for chest pain algorithms and additional  guidance. Performed at [facility], [address] [place], [place] [address]  Glucose, capillary     Status: Abnormal  Collection Time: 07/24/24  4:30 AM Result Value Ref Range  Glucose-Capillary 146 (H) 70 - 99 mg/dL   Comment: Glucose reference range applies only to samples taken after fasting for at least 8 hours. Glucose, capillary     Status: Abnormal  Collection Time: 07/24/24  7:13 AM Result Value Ref Range  Glucose-Capillary 116 (H) 70 - 99 mg/dL   Comment: Glucose reference range applies only to samples taken after fasting for at least 8 hours. Troponin I (High Sensitivity)     Status: Abnormal  Collection Time: 07/24/24  7:18 AM Result Value Ref Range  Troponin I (High Sensitivity) 75 (H) <18 ng/L   Comment: (NOTE) Elevated high sensitivity troponin I (hsTnI) values and significant  changes across serial measurements may suggest ACS but many other  chronic and acute conditions are known to elevate hsTnI results.  Refer to the Links section for chest pain algorithms and additional  guidance. Performed at [facility], [address] [place], [place] [address]  Brain natriuretic peptide     Status: Abnormal  Collection Time: 07/24/24  7:18 AM Result Value Ref Range  B Natriuretic  Peptide 3,252.3 (H) 0.0 - 100.0 pg/mL   Comment: Performed at [facility], [address] [place], [place] [address] Troponin I (High Sensitivity)     Status: Abnormal  Collection Time: 07/24/24  9:22 AM Result Value Ref Range  Troponin I (High Sensitivity) 68 (H) <18 ng/L   Comment: (NOTE) Elevated high sensitivity troponin I (hsTnI) values and significant  changes across serial measurements may suggest ACS but many other  chronic and acute conditions are known to elevate hsTnI results.  Refer to the Links section for chest pain algorithms and additional  guidance. Performed at [facility], [address] [place], [place] [address]  MRSA Next Gen by PCR, Nasal     Status: None  Collection Time: 07/24/24 11:08 AM  Specimen: Nasal Mucosa; Nasal Swab Result Value Ref Range  MRSA by PCR Next Gen NOT DETECTED NOT DETECTED   Comment: (NOTE) The GeneXpert MRSA Assay (FDA approved for NASAL specimens only), is one component of a comprehensive MRSA colonization surveillance program. It is not intended to diagnose MRSA infection nor to guide or monitor treatment for MRSA infections. Test performance is not FDA approved in patients less than 25 years old. Performed at [facility], [address] [place], [place] [address]  Glucose, capillary     Status: Abnormal  Collection Time: 07/24/24 11:26 AM Result Value Ref Range  Glucose-Capillary 140 (H) 70 - 99 mg/dL   Comment: Glucose reference range applies only to samples taken after fasting for at least 8 hours. Glucose, capillary     Status: Abnormal  Collection Time: 07/24/24  4:25 PM Result Value Ref Range  Glucose-Capillary 130 (H) 70 - 99 mg/dL   Comment: Glucose reference range applies only to samples taken after fasting for at least 8 hours. Glucose, capillary     Status:  None  Collection Time: 07/24/24 11:18 PM Result Value Ref Range  Glucose-Capillary 98 70 - 99 mg/dL   Comment: Glucose  reference range applies only to samples taken after fasting for at least 8 hours. Glucose, capillary     Status: Abnormal  Collection Time: 07/25/24  3:19 AM Result Value Ref Range  Glucose-Capillary 121 (H) 70 - 99 mg/dL   Comment: Glucose reference range applies only to samples taken after fasting for at least 8 hours. Basic metabolic panel with GFR     Status: Abnormal  Collection Time: 07/25/24  4:39 AM Result Value Ref Range  Sodium 144 135 - 145 mmol/L  Potassium 4.8 3.5 - 5.1 mmol/L  Chloride 99 98 - 111 mmol/L  CO2 34 (H) 22 - 32 mmol/L  Glucose, Bld 125 (H) 70 - 99 mg/dL   Comment: Glucose reference range applies only to samples taken after fasting for at least 8 hours.  BUN 54 (H) 8 - 23 mg/dL  Creatinine, Ser 8.84 9.38 - 1.24 mg/dL  Calcium  8.5 (L) 8.9 - 10.3 mg/dL  GFR, Estimated >39 >39 mL/min   Comment: (NOTE) Calculated using the CKD-EPI Creatinine Equation (2021)   Anion gap 11 5 - 15   Comment: Performed at [facility], [address] [place], [place] [address] Magnesium      Status: Abnormal  Collection Time: 07/25/24  4:39 AM Result Value Ref Range  Magnesium  2.9 (H) 1.7 - 2.4 mg/dL   Comment: Performed at [facility], [address] [place], [place] [address] Phosphorus     Status: Abnormal  Collection Time: 07/25/24  4:39 AM Result Value Ref Range  Phosphorus 5.0 (H) 2.5 - 4.6 mg/dL   Comment: Performed at [facility], [address] [place], [place] [address] CBC     Status: Abnormal  Collection Time: 07/25/24  4:39 AM Result Value Ref Range  WBC 10.5 4.0 - 10.5 K/uL  RBC 2.74 (L) 4.22 - 5.81 MIL/uL  Hemoglobin 7.7 (L) 13.0 - 17.0 g/dL  HCT 73.7 (L) 60.9 - 47.9 %  MCV 95.6 80.0 - 100.0 fL  MCH 28.1 26.0 - 34.0 pg  MCHC 29.4 (L) 30.0 - 36.0 g/dL  RDW 82.5 (H) 88.4 - 84.4 %  Platelets 231 150 - 400 K/uL  nRBC 0.0 0.0 - 0.2 %   Comment: Performed at [facility], [address] [place], [place]  [address] Glucose, capillary     Status: Abnormal  Collection Time: 07/25/24  8:13 AM Result Value Ref Range  Glucose-Capillary 102 (H) 70 - 99 mg/dL   Comment: Glucose reference range applies only to samples taken after fasting for at least 8 hours. Glucose, capillary     Status: Abnormal  Collection Time: 07/25/24 11:49 AM Result Value Ref Range  Glucose-Capillary 127 (H) 70 - 99 mg/dL   Comment: Glucose reference range applies only to samples taken after fasting for at least 8 hours. Prepare RBC (crossmatch)     Status: None  Collection Time: 07/25/24 12:33 PM Result Value Ref Range  Order Confirmation     ORDER PROCESSED BY BLOOD BANK Performed at [facility], [address] [place], [place] [address]  Basic metabolic panel     Status: Abnormal  Collection Time: 07/25/24  1:34 PM Result Value Ref Range  Sodium 140 135 - 145 mmol/L  Potassium 4.2 3.5 - 5.1 mmol/L  Chloride 95 (L) 98 - 111 mmol/L  CO2 31 22 - 32 mmol/L  Glucose, Bld  117 (H) 70 - 99 mg/dL   Comment: Glucose reference range applies only to samples taken after fasting for at least 8 hours.  BUN 55 (H) 8 - 23 mg/dL  Creatinine, Ser 8.79 9.38 - 1.24 mg/dL  Calcium  8.5 (L) 8.9 - 10.3 mg/dL  GFR, Estimated >39 >39 mL/min   Comment: (NOTE) Calculated using the CKD-EPI Creatinine Equation (2021)   Anion gap 14 5 - 15   Comment: Performed at [facility], [address] [place], [place] [address] Type and screen [facility]     Status: None  Collection Time: 07/25/24  1:34 PM Result Value Ref Range  ABO/RH(D) A POS   Antibody Screen NEG   Sample Expiration 07/28/2024,2359   Unit Number [id number]   Blood Component Type RED CELLS,LR   Unit division 00   Status of Unit ISSUED,FINAL   Transfusion Status OK TO TRANSFUSE   Crossmatch Result     Compatible Performed at [facility], [address] [place], [place] [address]  BPAM RBC     Status:  None  Collection Time: 07/25/24  1:34 PM Result Value Ref Range  ISSUE DATE / TIME [id number]   Blood Product Unit Number [id number]   PRODUCT CODE [id number]   Unit Type and Rh 6200   Blood Product Expiration Date [id number]  Glucose, capillary     Status: Abnormal  Collection Time: 07/25/24  4:15 PM Result Value Ref Range  Glucose-Capillary 148 (H) 70 - 99 mg/dL   Comment: Glucose reference range applies only to samples taken after fasting for at least 8 hours. CBC     Status: Abnormal  Collection Time: 07/26/24  4:26 AM Result Value Ref Range  WBC 16.1 (H) 4.0 - 10.5 K/uL  RBC 3.03 (L) 4.22 - 5.81 MIL/uL  Hemoglobin 8.7 (L) 13.0 - 17.0 g/dL  HCT 71.6 (L) 60.9 - 47.9 %  MCV 93.4 80.0 - 100.0 fL  MCH 28.7 26.0 - 34.0 pg  MCHC 30.7 30.0 - 36.0 g/dL  RDW 81.4 (H) 88.4 - 84.4 %  Platelets 274 150 - 400 K/uL  nRBC 0.0 0.0 - 0.2 %   Comment: Performed at [facility], [address], [place], [place] [address] Phosphorus     Status: None  Collection Time: 07/26/24  4:26 AM Result Value Ref Range  Phosphorus 4.0 2.5 - 4.6 mg/dL   Comment: Performed at [facility], [address] [place], [place] [address] Magnesium      Status: None  Collection Time: 07/26/24  4:26 AM Result Value Ref Range  Magnesium  2.4 1.7 - 2.4 mg/dL   Comment: Performed at [facility], [address] [place], [place] [address] Basic metabolic panel with GFR     Status: Abnormal  Collection Time: 07/26/24  4:26 AM Result Value Ref Range  Sodium 137 135 - 145 mmol/L  Potassium 3.7 3.5 - 5.1 mmol/L  Chloride 94 (L) 98 - 111 mmol/L  CO2 32 22 - 32 mmol/L  Glucose, Bld 170 (H) 70 - 99 mg/dL   Comment: Glucose reference range applies only to samples taken after fasting for at least 8 hours.  BUN 53 (H) 8 - 23 mg/dL  Creatinine, Ser 8.61 (H) 0.61 - 1.24 mg/dL  Calcium  8.6 (L) 8.9 - 10.3 mg/dL  GFR, Estimated 57 (L) >60 mL/min   Comment: (NOTE) Calculated using the  CKD-EPI Creatinine Equation (2021)   Anion gap 11 5 - 15   Comment: Performed at [facility], [address] [place], [place] [address] Glucose, capillary     Status: Abnormal  Collection Time: 07/26/24  7:38 AM Result Value Ref Range  Glucose-Capillary 128 (H) 70 - 99 mg/dL   Comment: Glucose reference range applies only to samples taken after fasting for at least 8 hours. Glucose, capillary     Status: Abnormal  Collection Time: 07/26/24 12:54 PM Result Value Ref Range  Glucose-Capillary 158 (H) 70 - 99 mg/dL   Comment: Glucose reference range applies only to samples taken after fasting for at least 8 hours. Basic metabolic panel     Status: Abnormal  Collection Time: 07/26/24  1:53 PM Result Value Ref Range  Sodium 135 135 - 145 mmol/L  Potassium 3.6 3.5 - 5.1 mmol/L  Chloride 95 (L) 98 - 111 mmol/L  CO2 30 22 - 32 mmol/L  Glucose, Bld 166 (H) 70 - 99 mg/dL   Comment: Glucose reference range applies only to samples taken after fasting for at least 8 hours.  BUN 49 (H) 8 - 23 mg/dL  Creatinine, Ser 8.72 (H) 0.61 - 1.24 mg/dL  Calcium  8.5 (L) 8.9 - 10.3 mg/dL  GFR, Estimated >39 >39 mL/min   Comment: (NOTE) Calculated using the CKD-EPI Creatinine Equation (2021)   Anion gap 10 5 - 15   Comment: Performed at [facility], [address] [place], [place] [address] CBC     Status: Abnormal  Collection Time: 07/27/24  5:35 AM Result Value Ref Range  WBC 13.4 (H) 4.0 - 10.5 K/uL  RBC 3.03 (L) 4.22 - 5.81 MIL/uL  Hemoglobin 8.7 (L) 13.0 - 17.0 g/dL  HCT 71.5 (L) 60.9 - 47.9 %  MCV 93.7 80.0 - 100.0 fL  MCH 28.7 26.0 - 34.0 pg  MCHC 30.6 30.0 - 36.0 g/dL  RDW 81.4 (H) 88.4 - 84.4 %  Platelets 251 150 - 400 K/uL  nRBC 0.0 0.0 - 0.2 %   Comment: Performed at [facility], [address] [place], [place] [address] Procalcitonin     Status: None  Collection Time: 07/27/24  5:35 AM Result Value Ref Range  Procalcitonin <0.10 ng/mL   Comment:         Interpretation: PCT (Procalcitonin) <= 0.5 ng/mL: Systemic infection (sepsis) is not likely. Local bacterial infection is possible. (NOTE)       Sepsis PCT Algorithm           Lower Respiratory Tract                                      Infection PCT Algorithm    ----------------------------     ----------------------------         PCT < 0.25 ng/mL                PCT < 0.10 ng/mL          Strongly encourage             Strongly discourage   discontinuation of antibiotics    initiation of antibiotics    ----------------------------     -----------------------------       PCT 0.25 - 0.50 ng/mL            PCT 0.10 - 0.25 ng/mL               OR       >80% decrease in PCT  Discourage initiation of                                            antibiotics      Encourage discontinuation           of antibiotics    ----------------------------     -----------------------------         PCT >= 0.50 ng/mL              PCT 0.26 - 0.50 ng/mL               AND        <80% decrease in PCT             Encourage initiation of                                             antibiotics       Encourage continuation           of antibiotics    ----------------------------     -----------------------------        PCT >= 0.50 ng/mL                  PCT > 0.50 ng/mL               AND         increase in PCT                  Strongly encourage                                      initiation of antibiotics    Strongly encourage escalation           of antibiotics                                     -----------------------------                                           PCT <= 0.25 ng/mL                                                 OR                                        > 80% decrease in PCT                                      Discontinue / Do not initiate  antibiotics  Performed at [facility], [address] [place], [place]  [address]  Magnesium      Status: None  Collection Time: 07/27/24  5:36 AM Result Value Ref Range  Magnesium  2.0 1.7 - 2.4 mg/dL   Comment: Performed at [facility], [address] [place], [place] [address] Basic metabolic panel     Status: Abnormal  Collection Time: 07/27/24  5:36 AM Result Value Ref Range  Sodium 138 135 - 145 mmol/L  Potassium 3.2 (L) 3.5 - 5.1 mmol/L  Chloride 97 (L) 98 - 111 mmol/L  CO2 27 22 - 32 mmol/L  Glucose, Bld 115 (H) 70 - 99 mg/dL   Comment: Glucose reference range applies only to samples taken after fasting for at least 8 hours.  BUN 41 (H) 8 - 23 mg/dL  Creatinine, Ser 8.60 (H) 0.61 - 1.24 mg/dL  Calcium  8.8 (L) 8.9 - 10.3 mg/dL  GFR, Estimated 56 (L) >60 mL/min   Comment: (NOTE) Calculated using the CKD-EPI Creatinine Equation (2021)   Anion gap 14 5 - 15   Comment: Performed at [facility], [address] [place], [place] [address] Magnesium      Status: None  Collection Time: 07/28/24  6:15 AM Result Value Ref Range  Magnesium  1.9 1.7 - 2.4 mg/dL   Comment: Performed at [facility], [address] [place], [place] [address] Basic metabolic panel with GFR     Status: Abnormal  Collection Time: 07/28/24  6:15 AM Result Value Ref Range  Sodium 138 135 - 145 mmol/L  Potassium 3.6 3.5 - 5.1 mmol/L  Chloride 99 98 - 111 mmol/L  CO2 29 22 - 32 mmol/L  Glucose, Bld 118 (H) 70 - 99 mg/dL   Comment: Glucose reference range applies only to samples taken after fasting for at least 8 hours.  BUN 35 (H) 8 - 23 mg/dL  Creatinine, Ser 8.67 (H) 0.61 - 1.24 mg/dL  Calcium  9.5 8.9 - 10.3 mg/dL  GFR, Estimated 60 (L) >60 mL/min   Comment: (NOTE) Calculated using the CKD-EPI Creatinine Equation (2021)   Anion gap 10 5 - 15   Comment: Performed at [facility], [address] [place], [place] [address] Basic metabolic panel with GFR     Status: Abnormal  Collection Time: 07/29/24  3:24 AM Result Value Ref  Range  Sodium 136 135 - 145 mmol/L  Potassium 3.6 3.5 - 5.1 mmol/L  Chloride 96 (L) 98 - 111 mmol/L  CO2 27 22 - 32 mmol/L  Glucose, Bld 102 (H) 70 - 99 mg/dL   Comment: Glucose reference range applies only to samples taken after fasting for at least 8 hours.  BUN 34 (H) 8 - 23 mg/dL  Creatinine, Ser 8.53 (H) 0.61 - 1.24 mg/dL  Calcium  9.0 8.9 - 10.3 mg/dL  GFR, Estimated 53 (L) >60 mL/min   Comment: (NOTE) Calculated using the CKD-EPI Creatinine Equation (2021)   Anion gap 13 5 - 15   Comment: Performed at [facility], [address] [place], [place] [address] Brain natriuretic peptide     Status: Abnormal  Collection Time: 07/29/24  7:52 AM Result Value Ref Range  B Natriuretic Peptide 977.9 (H) 0.0 - 100.0 pg/mL   Comment: Performed at [facility], [address] [place], [place] [address] Basic metabolic panel with GFR     Status: Abnormal  Collection Time: 07/30/24  5:52 AM Result Value Ref Range  Sodium 137 135 - 145 mmol/L  Potassium 3.6 3.5 - 5.1 mmol/L  Chloride 97 (L) 98 - 111 mmol/L  CO2 29 22 - 32 mmol/L  Glucose, Bld 138 (H) 70 - 99 mg/dL   Comment: Glucose reference range applies only to samples taken after fasting for at least 8 hours.  BUN 41 (H) 8 - 23 mg/dL  Creatinine, Ser 8.63 (H) 0.61 - 1.24 mg/dL  Calcium  8.8 (L) 8.9 - 10.3 mg/dL  GFR, Estimated 58 (L) >60 mL/min   Comment: (NOTE) Calculated using the CKD-EPI Creatinine Equation (2021)   Anion gap 11 5 - 15   Comment: Performed at [facility], [address] [place], [place] [address] CBC     Status: Abnormal  Collection Time: 07/30/24  5:52 AM Result Value Ref Range  WBC 8.8 4.0 - 10.5 K/uL  RBC 3.00 (L) 4.22 - 5.81 MIL/uL  Hemoglobin 8.5 (L) 13.0 - 17.0 g/dL  HCT 72.7 (L) 60.9 - 47.9 %  MCV 90.7 80.0 - 100.0 fL  MCH 28.3 26.0 - 34.0 pg  MCHC 31.3 30.0 - 36.0 g/dL  RDW 80.9 (H) 88.4 - 84.4 %  Platelets 310 150 - 400 K/uL  nRBC 0.0 0.0 - 0.2  %   Comment: Performed at [facility], [address] [place], [place] [address] Basic metabolic panel with GFR     Status: Abnormal  Collection Time: 07/31/24  7:05 AM Result Value Ref Range  Sodium 138 135 - 145 mmol/L  Potassium 3.9 3.5 - 5.1 mmol/L  Chloride 101 98 - 111 mmol/L  CO2 28 22 - 32 mmol/L  Glucose, Bld 143 (H) 70 - 99 mg/dL   Comment: Glucose reference range applies only to samples taken after fasting for at least 8 hours.  BUN 40 (H) 8 - 23 mg/dL  Creatinine, Ser 8.66 (H) 0.61 - 1.24 mg/dL  Calcium  8.7 (L) 8.9 - 10.3 mg/dL  GFR, Estimated 59 (L) >60 mL/min   Comment: (NOTE) Calculated using the CKD-EPI Creatinine Equation (2021)   Anion gap 9 5 - 15   Comment: Performed at [facility], [address] [place], [place] [address] CBC with Differential     Status: Abnormal  Collection Time: 08/12/24  2:57 PM Result Value Ref Range  WBC 12.2 (H) 4.0 - 10.5 K/uL  RBC 1.69 (L) 4.22 - 5.81 MIL/uL  Hemoglobin 4.7 (LL) 13.0 - 17.0 g/dL   Comment: REPEATED TO VERIFY This critical result has been called to [facility] by Vashti Diego on 08/12/2024 15:41:00, and has been read back.   HCT 15.8 (L) 39.0 - 52.0 %  MCV 93.5 80.0 - 100.0 fL  MCH 27.8 26.0 - 34.0 pg  MCHC 29.7 (L) 30.0 - 36.0 g/dL  RDW 82.7 (H) 88.4 - 84.4 %  Platelets 330 150 - 400 K/uL  nRBC 0.0 0.0 - 0.2 %  Neutrophils Relative % 78 %  Neutro Abs 9.6 (H) 1.7 - 7.7 K/uL  Lymphocytes Relative 9 %  Lymphs Abs 1.1 0.7 - 4.0 K/uL  Monocytes Relative 11 %  Monocytes Absolute 1.3 (H) 0.1 - 1.0 K/uL  Eosinophils Relative 1 %  Eosinophils Absolute 0.1 0.0 - 0.5 K/uL  Basophils Relative 0 %  Basophils Absolute 0.0 0.0 - 0.1 K/uL  Immature Granulocytes 1 %  Abs Immature Granulocytes 0.06 0.00 - 0.07 K/uL  Abs Granulocyte 9.6 (H) 1.5 - 6.5 K/uL   Comment: Performed at [facility], [address] [place], [place] [address] Comprehensive metabolic panel  Status:  Abnormal  Collection Time: 08/12/24  2:57 PM Result Value Ref Range  Sodium 133 (L) 135 - 145 mmol/L  Potassium 3.7 3.5 - 5.1 mmol/L  Chloride 94 (L) 98 - 111 mmol/L  CO2 26 22 - 32 mmol/L  Glucose, Bld 133 (H) 70 - 99 mg/dL   Comment: Glucose reference range applies only to samples taken after fasting for at least 8 hours.  BUN 33 (H) 8 - 23 mg/dL  Creatinine, Ser 8.31 (H) 0.61 - 1.24 mg/dL  Calcium  8.4 (L) 8.9 - 10.3 mg/dL  Total Protein 6.9 6.5 - 8.1 g/dL  Albumin  3.1 (L) 3.5 - 5.0 g/dL  AST 31 15 - 41 U/L  ALT 14 0 - 44 U/L  Alkaline Phosphatase 49 38 - 126 U/L  Total Bilirubin 1.0 0.0 - 1.2 mg/dL  GFR, Estimated 45 (L) >60 mL/min   Comment: (NOTE) Calculated using the CKD-EPI Creatinine Equation (2021)   Anion gap 13 5 - 15   Comment: Performed at [facility], [address] [place], [place] [address] Lipase, blood     Status: None  Collection Time: 08/12/24  2:57 PM Result Value Ref Range  Lipase 33 11 - 51 U/L   Comment: Performed at [facility], [address] [place], [place] [address] Lactic acid, plasma     Status: Abnormal  Collection Time: 08/12/24  2:57 PM Result Value Ref Range  Lactic Acid, Venous 3.2 (HH) 0.5 - 1.9 mmol/L   Comment: CRITICAL RESULT CALLED TO, READ BACK BY AND VERIFIED WITH MEGAN ASHBURN 08/12/24 1614 MU Performed at [facility], [address] [place], [place] [address]  Troponin I (High Sensitivity)     Status: Abnormal  Collection Time: 08/12/24  2:57 PM Result Value Ref Range  Troponin I (High Sensitivity) 888 (HH) <18 ng/L   Comment: CRITICAL RESULT CALLED TO, READ BACK BY AND VERIFIED WITH MEGAN ASHBURN 08/12/24 1557 MU (NOTE) Elevated high sensitivity troponin I (hsTnI) values and significant  changes across serial measurements may suggest ACS but many other  chronic and acute conditions are known to elevate hsTnI results.  Refer to the Links section for chest pain algorithms and additional   guidance. Performed at [facility], [address] [place], [place] [address]  Brain natriuretic peptide     Status: Abnormal  Collection Time: 08/12/24  2:57 PM Result Value Ref Range  B Natriuretic Peptide 1,514.9 (H) 0.0 - 100.0 pg/mL   Comment: Performed at [facility], [address] [place], [place] [address] Type and screen [facility]     Status: None  Collection Time: 08/12/24  2:57 PM Result Value Ref Range  ABO/RH(D) A POS   Antibody Screen NEG   Sample Expiration 08/15/2024,2359   Unit Number [id number]   Blood Component Type RED CELLS,LR   Unit division 00   Status of Unit ISSUED,FINAL   Transfusion Status OK TO TRANSFUSE   Crossmatch Result COMPATIBLE   Unit tag comment EMERGENCY RELEASE   Unit Number [id number]   Blood Component Type RED CELLS,LR   Unit division 00   Status of Unit ISSUED,FINAL   Transfusion Status OK TO TRANSFUSE   Crossmatch Result COMPATIBLE   Unit tag comment EMERGENCY RELEASE   Unit Number [id number]   Blood Component Type RED CELLS,LR   Unit division 00   Status of Unit ISSUED,FINAL   Transfusion Status OK TO TRANSFUSE   Crossmatch Result Compatible   Unit Number [id number]  Blood Component Type RED CELLS,LR   Unit division 00   Status of Unit ISSUED,FINAL   Transfusion Status OK TO TRANSFUSE   Crossmatch Result Compatible   Unit Number [id number]   Blood Component Type RED CELLS,LR   Unit division 00   Status of Unit ISSUED,FINAL   Transfusion Status OK TO TRANSFUSE   Crossmatch Result     Compatible Performed at [facility], [address] [place], [place] [address]  Protime-INR     Status: Abnormal  Collection Time: 08/12/24  2:57 PM Result Value Ref Range  Prothrombin Time 15.8 (H) 11.4 - 15.2 seconds  INR 1.2 0.8 - 1.2   Comment: (NOTE) INR goal varies based on device and disease states. Performed at [facility], [address] [place], [place] [address]  BPAM RBC     Status: None  Collection Time: 08/12/24  2:57 PM Result Value Ref Range  ISSUE DATE / TIME 797491738480   Blood Product Unit Number [id number]   PRODUCT CODE [id number]   Unit Type and Rh 5100   Blood Product Expiration Date 797490717640   ISSUE DATE / TIME 797491738480   Blood Product Unit Number [id number]   PRODUCT CODE [id number]   Unit Type and Rh 5100   Blood Product Expiration Date 797490717640   ISSUE DATE / TIME 797491738358   Blood Product Unit Number [id number]   PRODUCT CODE [id number]   Unit Type and Rh 6200   Blood Product Expiration Date 797490787640   ISSUE DATE / TIME 797491738191   Blood Product Unit Number [id number]   PRODUCT CODE [id number]   Unit Type and Rh 6200   Blood Product Expiration Date 797490787640   ISSUE DATE / TIME 797491708771   Blood Product Unit Number [id number]   PRODUCT CODE [id number]   Unit Type and Rh 6200   Blood Product Expiration Date 797490857640  Blood culture (routine x 2)     Status: None  Collection Time: 08/12/24  3:07 PM  Specimen: BLOOD Result Value Ref Range  Specimen Description BLOOD BLOOD RIGHT FOREARM   Special Requests     BOTTLES DRAWN AEROBIC AND ANAEROBIC Blood Culture results may not be optimal due to an inadequate volume of blood received in culture bottles  Culture     NO GROWTH 5 DAYS Performed at [facility], [address] [place], [place] [address]   Report Status 08/17/2024 FINAL  Blood culture (routine x 2)     Status: None  Collection Time: 08/12/24  3:07 PM  Specimen: BLOOD Result Value Ref Range  Specimen Description BLOOD LEFT WRIST   Special Requests     BOTTLES DRAWN AEROBIC AND ANAEROBIC Blood Culture results may not be optimal due to an inadequate volume of blood received in culture bottles  Culture     NO GROWTH 5 DAYS Performed at [facility], [address] [place], [place] [address]   Report Status 08/17/2024  FINAL  Prepare RBC (crossmatch)     Status: None  Collection Time: 08/12/24  3:12 PM Result Value Ref Range  Order Confirmation     ORDER PROCESSED BY BLOOD BANK Performed at [facility], [address] [place], [place] [address]  Prepare RBC (crossmatch)     Status: None  Collection Time: 08/12/24  3:42 PM Result Value Ref Range  Order Confirmation     DUPLICATE REQUEST Performed at [facility], [address] [place], [place] [address]  Lactic acid, plasma  Status: Abnormal  Collection Time: 08/12/24  4:57 PM Result Value Ref Range  Lactic Acid, Venous 2.4 (HH) 0.5 - 1.9 mmol/L   Comment: CRITICAL VALUE NOTED. VALUE IS CONSISTENT WITH PREVIOUSLY REPORTED/CALLED VALUE SS Performed at [facility], [address] [place], [place] [address]  Troponin I (High Sensitivity)     Status: Abnormal  Collection Time: 08/12/24  4:57 PM Result Value Ref Range  Troponin I (High Sensitivity) 793 (HH) <18 ng/L   Comment: CRITICAL VALUE NOTED. VALUE IS CONSISTENT WITH PREVIOUSLY REPORTED/CALLED VALUE SS (NOTE) Elevated high sensitivity troponin I (hsTnI) values and significant  changes across serial measurements may suggest ACS but many other  chronic and acute conditions are known to elevate hsTnI results.  Refer to the Links section for chest pain algorithms and additional  guidance. Performed at [facility], [address] [place], [place] [address]  Prepare platelet pheresis     Status: None  Collection Time: 08/12/24  5:05 PM Result Value Ref Range  Unit Number [id number]   Blood Component Type PLTP3 PSORALEN TREATED   Unit division 00   Status of Unit REL FROM [facility]   Transfusion Status OK TO TRANSFUSE  BPAM Platelet Pheresis     Status: None  Collection Time: 08/12/24  5:05 PM Result Value Ref Range  ISSUE DATE / TIME 797491728855   Blood Product Unit Number [id number]   PRODUCT CODE [id number]   Unit Type and  Rh 5100   Blood Product Expiration Date 797491727640  Glucose, capillary     Status: Abnormal  Collection Time: 08/12/24  6:42 PM Result Value Ref Range  Glucose-Capillary 114 (H) 70 - 99 mg/dL   Comment: Glucose reference range applies only to samples taken after fasting for at least 8 hours. CBC with Differential/Platelet     Status: Abnormal  Collection Time: 08/12/24  9:01 PM Result Value Ref Range  WBC 14.5 (H) 4.0 - 10.5 K/uL  RBC 3.25 (L) 4.22 - 5.81 MIL/uL  Hemoglobin 9.3 (L) 13.0 - 17.0 g/dL   Comment: REPEATED TO VERIFY  HCT 28.4 (L) 39.0 - 52.0 %  MCV 87.4 80.0 - 100.0 fL  MCH 28.6 26.0 - 34.0 pg  MCHC 32.7 30.0 - 36.0 g/dL  RDW 84.0 (H) 88.4 - 84.4 %  Platelets 307 150 - 400 K/uL  nRBC 0.0 0.0 - 0.2 %  Neutrophils Relative % 72 %  Neutro Abs 10.5 (H) 1.7 - 7.7 K/uL  Lymphocytes Relative 12 %  Lymphs Abs 1.7 0.7 - 4.0 K/uL  Monocytes Relative 14 %  Monocytes Absolute 2.0 (H) 0.1 - 1.0 K/uL  Eosinophils Relative 1 %  Eosinophils Absolute 0.1 0.0 - 0.5 K/uL  Basophils Relative 1 %  Basophils Absolute 0.1 0.0 - 0.1 K/uL  Immature Granulocytes 0 %  Abs Immature Granulocytes 0.05 0.00 - 0.07 K/uL  Abs Granulocyte 10.5 (H) 1.5 - 6.5 K/uL   Comment: Performed at [facility], [address] [place], [place] [address] CBC with Differential/Platelet     Status: Abnormal  Collection Time: 08/13/24  4:01 AM Result Value Ref Range  WBC 13.0 (H) 4.0 - 10.5 K/uL  RBC 2.91 (L) 4.22 - 5.81 MIL/uL  Hemoglobin 8.3 (L) 13.0 - 17.0 g/dL  HCT 74.6 (L) 60.9 - 47.9 %  MCV 86.9 80.0 - 100.0 fL  MCH 28.5 26.0 - 34.0 pg  MCHC 32.8 30.0 - 36.0 g/dL  RDW 83.8 (H) 88.4 - 84.4 %  Platelets 302 150 - 400 K/uL  nRBC 0.0 0.0 - 0.2 %  Neutrophils Relative % 71 %  Neutro Abs 9.3 (H) 1.7 - 7.7 K/uL  Lymphocytes Relative 13 %  Lymphs Abs 1.7 0.7 - 4.0 K/uL  Monocytes Relative 12 %  Monocytes Absolute 1.6 (H) 0.1 - 1.0 K/uL  Eosinophils Relative 2 %  Eosinophils  Absolute 0.2 0.0 - 0.5 K/uL  Basophils Relative 1 %  Basophils Absolute 0.1 0.0 - 0.1 K/uL  Immature Granulocytes 1 %  Abs Immature Granulocytes 0.07 0.00 - 0.07 K/uL   Comment: Performed at [facility], [address] [place], [place] [address] Comprehensive metabolic panel     Status: Abnormal  Collection Time: 08/13/24  4:01 AM Result Value Ref Range  Sodium 134 (L) 135 - 145 mmol/L  Potassium 3.5 3.5 - 5.1 mmol/L  Chloride 101 98 - 111 mmol/L  CO2 26 22 - 32 mmol/L  Glucose, Bld 107 (H) 70 - 99 mg/dL   Comment: Glucose reference range applies only to samples taken after fasting for at least 8 hours.  BUN 28 (H) 8 - 23 mg/dL  Creatinine, Ser 8.43 (H) 0.61 - 1.24 mg/dL  Calcium  7.8 (L) 8.9 - 10.3 mg/dL  Total Protein 6.6 6.5 - 8.1 g/dL  Albumin  2.9 (L) 3.5 - 5.0 g/dL  AST 19 15 - 41 U/L  ALT 12 0 - 44 U/L  Alkaline Phosphatase 50 38 - 126 U/L  Total Bilirubin 1.3 (H) 0.0 - 1.2 mg/dL  GFR, Estimated 49 (L) >60 mL/min   Comment: (NOTE) Calculated using the CKD-EPI Creatinine Equation (2021)   Anion gap 7 5 - 15   Comment: Performed at [facility], [address] [place], [place] [address] Magnesium      Status: None  Collection Time: 08/13/24  4:01 AM Result Value Ref Range  Magnesium  2.1 1.7 - 2.4 mg/dL   Comment: Performed at [facility], [address] [place], [place] [address] Phosphorus     Status: None  Collection Time: 08/13/24  4:01 AM Result Value Ref Range  Phosphorus 4.3 2.5 - 4.6 mg/dL   Comment: Performed at [facility], [address] [place], [place] [address] MRSA Next Gen by PCR, Nasal     Status: Abnormal  Collection Time: 08/13/24  7:51 AM  Specimen: Nasal Mucosa; Nasal Swab Result Value Ref Range  MRSA by PCR Next Gen DETECTED (A) NOT DETECTED   Comment: RESULT CALLED TO, READ BACK BY AND VERIFIED WITH: DEBBIE PANKERTON 08/13/24 0930 MW (NOTE) The GeneXpert MRSA Assay (FDA approved for NASAL specimens  only), is one component of a comprehensive MRSA colonization surveillance program. It is not intended to diagnose MRSA infection nor to guide or monitor treatment for MRSA infections. Test performance is not FDA approved in patients less than 67 years old. Performed at [facility], [address] [place], [place] [address]  Resp panel by RT-PCR (RSV, Flu A&B, Covid) Anterior Nasal Swab     Status: Abnormal  Collection Time: 08/13/24  7:51 AM  Specimen: Anterior Nasal Swab Result Value Ref Range  SARS Coronavirus 2 by RT PCR POSITIVE (A) NEGATIVE   Comment: (NOTE) SARS-CoV-2 target nucleic acids are DETECTED.  The SARS-CoV-2 RNA is generally detectable in upper respiratory specimens during the acute phase of infection. Positive results are indicative of the presence of the identified virus, but do not rule out bacterial infection or co-infection with other pathogens not detected by the test. Clinical correlation with patient history and other diagnostic  information is necessary to determine patient infection status. The expected result is Negative.  Fact Sheet for Patients: bloggercourse.com  Fact Sheet for Healthcare Providers: seriousbroker.it  This test is not yet approved or cleared by the [place]  FDA and  has been authorized for detection and/or diagnosis of SARS-CoV-2 by FDA under an Emergency Use Authorization (EUA).  This EUA will remain in effect (meaning this test can be used) for the duration of  the COVID-19 declaration under Section 564(b)(1) of the A ct, 21 U.S.C. section 360bbb-3(b)(1), unless the authorization is terminated or revoked sooner.    Influenza A by PCR NEGATIVE NEGATIVE  Influenza B by PCR NEGATIVE NEGATIVE   Comment: (NOTE) The Xpert Xpress SARS-CoV-2/FLU/RSV plus assay is intended as an aid in the diagnosis of influenza from Nasopharyngeal swab specimens and should not be used  as a sole basis for treatment. Nasal washings and aspirates are unacceptable for Xpert Xpress SARS-CoV-2/FLU/RSV testing.  Fact Sheet for Patients: bloggercourse.com  Fact Sheet for Healthcare Providers: seriousbroker.it  This test is not yet approved or cleared by the [place]  FDA and has been authorized for detection and/or diagnosis of SARS-CoV-2 by FDA under an Emergency Use Authorization (EUA). This EUA will remain in effect (meaning this test can be used) for the duration of the COVID-19 declaration under Section 564(b)(1) of the Act, 21 U.S.C. section 360bbb-3(b)(1), unless the authorization is terminated or revoked.    Resp Syncytial Virus by PCR NEGATIVE NEGATIVE   Comment: (NOTE) Fact Sheet for Patients: bloggercourse.com  Fact Sheet for Healthcare Providers: seriousbroker.it  This test is not yet approved or cleared by the [place]  FDA and has been authorized for detection and/or diagnosis of SARS-CoV-2 by FDA under an Emergency Use Authorization (EUA). This EUA will remain in effect (meaning this test can be used) for the duration of the COVID-19 declaration under Section 564(b)(1) of the Act, 21 U.S.C. section 360bbb-3(b)(1), unless the authorization is terminated or revoked.  Performed at [facility], [address] [place], [place] [address]  Respiratory (~20 pathogens) panel by PCR     Status: None  Collection Time: 08/13/24  7:51 AM  Specimen: Nasopharyngeal Swab; Respiratory Result Value Ref Range  Adenovirus NOT DETECTED NOT DETECTED  Coronavirus 229E NOT DETECTED NOT DETECTED   Comment: (NOTE) The Coronavirus on the Respiratory Panel, DOES NOT test for the novel  Coronavirus (2019 nCoV)   Coronavirus HKU1 NOT DETECTED NOT DETECTED  Coronavirus NL63 NOT DETECTED NOT DETECTED  Coronavirus OC43 NOT DETECTED NOT  DETECTED  Metapneumovirus NOT DETECTED NOT DETECTED  Rhinovirus / Enterovirus NOT DETECTED NOT DETECTED  Influenza A NOT DETECTED NOT DETECTED  Influenza B NOT DETECTED NOT DETECTED  Parainfluenza Virus 1 NOT DETECTED NOT DETECTED  Parainfluenza Virus 2 NOT DETECTED NOT DETECTED  Parainfluenza Virus 3 NOT DETECTED NOT DETECTED  Parainfluenza Virus 4 NOT DETECTED NOT DETECTED  Respiratory Syncytial Virus NOT DETECTED NOT DETECTED  Bordetella pertussis NOT DETECTED NOT DETECTED  Bordetella Parapertussis NOT DETECTED NOT DETECTED  Chlamydophila pneumoniae NOT DETECTED NOT DETECTED  Mycoplasma pneumoniae NOT DETECTED NOT DETECTED   Comment: Performed at [facility], [address] [place], [place] [address] Glucose, capillary     Status: Abnormal  Collection Time: 08/13/24  7:51 AM Result Value Ref Range  Glucose-Capillary 100 (H) 70 - 99 mg/dL   Comment: Glucose reference range applies only to samples taken after fasting for at least 8 hours. CBC with Differential/Platelet     Status:  Abnormal  Collection Time: 08/13/24  8:56 AM Result Value Ref Range  WBC 15.0 (H) 4.0 - 10.5 K/uL  RBC 3.12 (L) 4.22 - 5.81 MIL/uL  Hemoglobin 8.8 (L) 13.0 - 17.0 g/dL  HCT 72.2 (L) 60.9 - 47.9 %  MCV 88.8 80.0 - 100.0 fL  MCH 28.2 26.0 - 34.0 pg  MCHC 31.8 30.0 - 36.0 g/dL  RDW 83.4 (H) 88.4 - 84.4 %  Platelets 328 150 - 400 K/uL  nRBC 0.0 0.0 - 0.2 %  Neutrophils Relative % 78 %  Neutro Abs 11.8 (H) 1.7 - 7.7 K/uL  Lymphocytes Relative 9 %  Lymphs Abs 1.3 0.7 - 4.0 K/uL  Monocytes Relative 10 %  Monocytes Absolute 1.6 (H) 0.1 - 1.0 K/uL  Eosinophils Relative 1 %  Eosinophils Absolute 0.2 0.0 - 0.5 K/uL  Basophils Relative 1 %  Basophils Absolute 0.1 0.0 - 0.1 K/uL  Immature Granulocytes 1 %  Abs Immature Granulocytes 0.07 0.00 - 0.07 K/uL   Comment: Performed at [facility], [address] [place], [place] [address] Urinalysis, Complete w Microscopic -Urine, Clean Catch      Status: Abnormal  Collection Time: 08/13/24  9:52 AM Result Value Ref Range  Color, Urine YELLOW (A) YELLOW  APPearance CLEAR (A) CLEAR  Specific Gravity, Urine 1.026 1.005 - 1.030  pH 5.0 5.0 - 8.0  Glucose, UA >=500 (A) NEGATIVE mg/dL  Hgb urine dipstick SMALL (A) NEGATIVE  Bilirubin Urine NEGATIVE NEGATIVE  Ketones, ur NEGATIVE NEGATIVE mg/dL  Protein, ur NEGATIVE NEGATIVE mg/dL  Nitrite NEGATIVE NEGATIVE  Leukocytes,Ua NEGATIVE NEGATIVE  RBC / HPF 0-5 0 - 5 RBC/hpf  WBC, UA 0-5 0 - 5 WBC/hpf  Bacteria, UA NONE SEEN NONE SEEN  Squamous Epithelial / HPF 0-5 0 - 5 /HPF   Comment: Performed at [facility], [address] [place], [place] [address] Glucose, capillary     Status: Abnormal  Collection Time: 08/13/24 11:34 AM Result Value Ref Range  Glucose-Capillary 104 (H) 70 - 99 mg/dL   Comment: Glucose reference range applies only to samples taken after fasting for at least 8 hours. CBC with Differential/Platelet     Status: Abnormal  Collection Time: 08/13/24  3:13 PM Result Value Ref Range  WBC 13.9 (H) 4.0 - 10.5 K/uL  RBC 3.22 (L) 4.22 - 5.81 MIL/uL  Hemoglobin 9.0 (L) 13.0 - 17.0 g/dL  HCT 71.4 (L) 60.9 - 47.9 %  MCV 88.5 80.0 - 100.0 fL  MCH 28.0 26.0 - 34.0 pg  MCHC 31.6 30.0 - 36.0 g/dL  RDW 83.6 (H) 88.4 - 84.4 %  Platelets 291 150 - 400 K/uL  nRBC 0.0 0.0 - 0.2 %  Neutrophils Relative % 94 %  Neutro Abs 13.1 (H) 1.7 - 7.7 K/uL  Lymphocytes Relative 3 %  Lymphs Abs 0.4 (L) 0.7 - 4.0 K/uL  Monocytes Relative 2 %  Monocytes Absolute 0.2 0.1 - 1.0 K/uL  Eosinophils Relative 0 %  Eosinophils Absolute 0.0 0.0 - 0.5 K/uL  Basophils Relative 0 %  Basophils Absolute 0.1 0.0 - 0.1 K/uL  Immature Granulocytes 1 %  Abs Immature Granulocytes 0.08 (H) 0.00 - 0.07 K/uL   Comment: Performed at [facility], [address] [place], [place] [address] Procalcitonin     Status: None  Collection Time: 08/13/24  3:13 PM Result Value Ref  Range  Procalcitonin <0.10 ng/mL   Comment:        Interpretation: PCT (Procalcitonin) <= 0.5 ng/mL: Systemic infection (  sepsis) is not likely. Local bacterial infection is possible. (NOTE)       Sepsis PCT Algorithm           Lower Respiratory Tract                                      Infection PCT Algorithm    ----------------------------     ----------------------------         PCT < 0.25 ng/mL                PCT < 0.10 ng/mL          Strongly encourage             Strongly discourage   discontinuation of antibiotics    initiation of antibiotics    ----------------------------     -----------------------------       PCT 0.25 - 0.50 ng/mL            PCT 0.10 - 0.25 ng/mL               OR       >80% decrease in PCT            Discourage initiation of                                            antibiotics      Encourage discontinuation           of antibiotics    ----------------------------     -----------------------------         PCT >= 0.50 ng/mL              PCT 0.26 - 0.50 ng/mL               AND        <80% decrease in PCT             Encourage initiation of                                             antibiotics       Encourage continuation           of antibiotics    ----------------------------     -----------------------------        PCT >= 0.50 ng/mL                  PCT > 0.50 ng/mL               AND         increase in PCT                  Strongly encourage                                      initiation of antibiotics    Strongly encourage escalation           of antibiotics                                     -----------------------------  PCT <= 0.25 ng/mL                                                 OR                                        > 80% decrease in PCT                                      Discontinue / Do not initiate                                             antibiotics  Performed at [facility], [address] [place], [place] [address]  Glucose, capillary     Status: Abnormal  Collection Time: 08/13/24  4:40 PM Result Value Ref Range  Glucose-Capillary 195 (H) 70 - 99 mg/dL   Comment: Glucose reference range applies only to samples taken after fasting for at least 8 hours. Glucose, capillary     Status: Abnormal  Collection Time: 08/13/24  7:41 PM Result Value Ref Range  Glucose-Capillary 179 (H) 70 - 99 mg/dL   Comment: Glucose reference range applies only to samples taken after fasting for at least 8 hours. CBC with Differential/Platelet     Status: Abnormal  Collection Time: 08/13/24  8:49 PM Result Value Ref Range  WBC 11.0 (H) 4.0 - 10.5 K/uL  RBC 3.37 (L) 4.22 - 5.81 MIL/uL  Hemoglobin 9.5 (L) 13.0 - 17.0 g/dL  HCT 69.8 (L) 60.9 - 47.9 %  MCV 89.3 80.0 - 100.0 fL  MCH 28.2 26.0 - 34.0 pg  MCHC 31.6 30.0 - 36.0 g/dL  RDW 83.5 (H) 88.4 - 84.4 %  Platelets 342 150 - 400 K/uL  nRBC 0.0 0.0 - 0.2 %  Neutrophils Relative % 95 %  Neutro Abs 10.4 (H) 1.7 - 7.7 K/uL  Lymphocytes Relative 4 %  Lymphs Abs 0.4 (L) 0.7 - 4.0 K/uL  Monocytes Relative 1 %  Monocytes Absolute 0.1 0.1 - 1.0 K/uL  Eosinophils Relative 0 %  Eosinophils Absolute 0.0 0.0 - 0.5 K/uL  Basophils Relative 0 %  Basophils Absolute 0.0 0.0 - 0.1 K/uL  Immature Granulocytes 0 %  Abs Immature Granulocytes 0.04 0.00 - 0.07 K/uL   Comment: Performed at [facility], [address] [name], [place] [address] Glucose, capillary     Status: Abnormal  Collection Time: 08/13/24 11:42 PM Result Value Ref Range  Glucose-Capillary 177 (H) 70 - 99 mg/dL   Comment: Glucose reference range applies only to samples taken after fasting for at least 8 hours. CBC with Differential/Platelet     Status: Abnormal  Collection Time: 08/14/24  3:11 AM Result Value Ref Range  WBC 7.1 4.0 - 10.5 K/uL  RBC 2.82 (L) 4.22 - 5.81 MIL/uL  Hemoglobin 8.0 (L) 13.0 - 17.0 g/dL  HCT 74.9 (L) 60.9 - 47.9  %  MCV 88.7 80.0 - 100.0 fL  MCH 28.4 26.0 - 34.0 pg  MCHC 32.0 30.0 - 36.0 g/dL  RDW  16.0 (H) 11.5 - 15.5 %  Platelets 264 150 - 400 K/uL  nRBC 0.0 0.0 - 0.2 %  Neutrophils Relative % 88 %  Neutro Abs 6.2 1.7 - 7.7 K/uL  Lymphocytes Relative 9 %  Lymphs Abs 0.6 (L) 0.7 - 4.0 K/uL  Monocytes Relative 3 %  Monocytes Absolute 0.2 0.1 - 1.0 K/uL  Eosinophils Relative 0 %  Eosinophils Absolute 0.0 0.0 - 0.5 K/uL  Basophils Relative 0 %  Basophils Absolute 0.0 0.0 - 0.1 K/uL  Immature Granulocytes 0 %  Abs Immature Granulocytes 0.02 0.00 - 0.07 K/uL   Comment: Performed at [facility], [address] [place], [place] [address] Basic metabolic panel with GFR     Status: Abnormal  Collection Time: 08/14/24  3:11 AM Result Value Ref Range  Sodium 135 135 - 145 mmol/L  Potassium 4.8 3.5 - 5.1 mmol/L  Chloride 99 98 - 111 mmol/L  CO2 26 22 - 32 mmol/L  Glucose, Bld 164 (H) 70 - 99 mg/dL   Comment: Glucose reference range applies only to samples taken after fasting for at least 8 hours.  BUN 27 (H) 8 - 23 mg/dL  Creatinine, Ser 8.62 (H) 0.61 - 1.24 mg/dL  Calcium  8.3 (L) 8.9 - 10.3 mg/dL  GFR, Estimated 57 (L) >60 mL/min   Comment: (NOTE) Calculated using the CKD-EPI Creatinine Equation (2021)   Anion gap 10 5 - 15   Comment: Performed at [facility], [address] [place], [place] [address] Magnesium      Status: None  Collection Time: 08/14/24  3:11 AM Result Value Ref Range  Magnesium  2.1 1.7 - 2.4 mg/dL   Comment: Performed at [facility], [address], [place], [place] [address] Phosphorus     Status: None  Collection Time: 08/14/24  3:11 AM Result Value Ref Range  Phosphorus 3.2 2.5 - 4.6 mg/dL   Comment: Performed at [facility], [address] [place], [place] [address] Glucose, capillary     Status: Abnormal  Collection Time: 08/14/24  4:06 AM Result Value Ref Range  Glucose-Capillary 166 (H) 70 - 99  mg/dL   Comment: Glucose reference range applies only to samples taken after fasting for at least 8 hours. CBC with Differential/Platelet     Status: Abnormal  Collection Time: 08/14/24 10:26 AM Result Value Ref Range  WBC 11.5 (H) 4.0 - 10.5 K/uL  RBC 3.01 (L) 4.22 - 5.81 MIL/uL  Hemoglobin 8.5 (L) 13.0 - 17.0 g/dL  HCT 72.8 (L) 60.9 - 47.9 %  MCV 90.0 80.0 - 100.0 fL  MCH 28.2 26.0 - 34.0 pg  MCHC 31.4 30.0 - 36.0 g/dL  RDW 83.7 (H) 88.4 - 84.4 %  Platelets 303 150 - 400 K/uL  nRBC 0.0 0.0 - 0.2 %  Neutrophils Relative % 92 %  Neutro Abs 10.5 (H) 1.7 - 7.7 K/uL  Lymphocytes Relative 4 %  Lymphs Abs 0.5 (L) 0.7 - 4.0 K/uL  Monocytes Relative 4 %  Monocytes Absolute 0.4 0.1 - 1.0 K/uL  Eosinophils Relative 0 %  Eosinophils Absolute 0.0 0.0 - 0.5 K/uL  Basophils Relative 0 %  Basophils Absolute 0.0 0.0 - 0.1 K/uL  Immature Granulocytes 0 %  Abs Immature Granulocytes 0.05 0.00 - 0.07 K/uL   Comment: Performed at [facility], [address] [place], [place] [address] Glucose, capillary     Status: Abnormal  Collection Time: 08/14/24 12:07 PM Result Value Ref Range  Glucose-Capillary 194 (H) 70 -  99 mg/dL   Comment: Glucose reference range applies only to samples taken after fasting for at least 8 hours. CBC with Differential/Platelet     Status: Abnormal  Collection Time: 08/14/24  3:51 PM Result Value Ref Range  WBC 15.6 (H) 4.0 - 10.5 K/uL  RBC 3.13 (L) 4.22 - 5.81 MIL/uL  Hemoglobin 8.8 (L) 13.0 - 17.0 g/dL  HCT 72.4 (L) 60.9 - 47.9 %  MCV 87.9 80.0 - 100.0 fL  MCH 28.1 26.0 - 34.0 pg  MCHC 32.0 30.0 - 36.0 g/dL  RDW 83.8 (H) 88.4 - 84.4 %  Platelets 263 150 - 400 K/uL  nRBC 0.0 0.0 - 0.2 %  Neutrophils Relative % 85 %  Neutro Abs 13.3 (H) 1.7 - 7.7 K/uL  Lymphocytes Relative 6 %  Lymphs Abs 0.9 0.7 - 4.0 K/uL  Monocytes Relative 8 %  Monocytes Absolute 1.2 (H) 0.1 - 1.0 K/uL  Eosinophils Relative 0 %  Eosinophils Absolute 0.0 0.0 - 0.5  K/uL  Basophils Relative 0 %  Basophils Absolute 0.0 0.0 - 0.1 K/uL  Immature Granulocytes 1 %  Abs Immature Granulocytes 0.17 (H) 0.00 - 0.07 K/uL   Comment: Performed at [facility], [address] [place], [place] [address] Glucose, capillary     Status: Abnormal  Collection Time: 08/14/24  4:38 PM Result Value Ref Range  Glucose-Capillary 150 (H) 70 - 99 mg/dL   Comment: Glucose reference range applies only to samples taken after fasting for at least 8 hours. Basic metabolic panel with GFR     Status: Abnormal  Collection Time: 08/15/24  7:03 AM Result Value Ref Range  Sodium 130 (L) 135 - 145 mmol/L  Potassium 4.5 3.5 - 5.1 mmol/L  Chloride 97 (L) 98 - 111 mmol/L  CO2 26 22 - 32 mmol/L  Glucose, Bld 155 (H) 70 - 99 mg/dL   Comment: Glucose reference range applies only to samples taken after fasting for at least 8 hours.  BUN 31 (H) 8 - 23 mg/dL  Creatinine, Ser 8.62 (H) 0.61 - 1.24 mg/dL  Calcium  8.4 (L) 8.9 - 10.3 mg/dL  GFR, Estimated 57 (L) >60 mL/min   Comment: (NOTE) Calculated using the CKD-EPI Creatinine Equation (2021)   Anion gap 7 5 - 15   Comment: Performed at [facility], [address] [place], [place] [address] Magnesium      Status: None  Collection Time: 08/15/24  7:03 AM Result Value Ref Range  Magnesium  2.3 1.7 - 2.4 mg/dL   Comment: Performed at [facility], [address] [place], [place] [address] Phosphorus     Status: None  Collection Time: 08/15/24  7:03 AM Result Value Ref Range  Phosphorus 3.3 2.5 - 4.6 mg/dL   Comment: Performed at [facility], [address] [place], [place] [address] CBC     Status: Abnormal  Collection Time: 08/15/24  7:03 AM Result Value Ref Range  WBC 13.6 (H) 4.0 - 10.5 K/uL  RBC 2.83 (L) 4.22 - 5.81 MIL/uL  Hemoglobin 8.0 (L) 13.0 - 17.0 g/dL  HCT 73.8 (L) 60.9 - 47.9 %  MCV 92.2 80.0 - 100.0 fL  MCH 28.3 26.0 - 34.0 pg  MCHC 30.7 30.0 - 36.0 g/dL  RDW 83.7 (H) 88.4  - 84.4 %  Platelets 284 150 - 400 K/uL  nRBC 0.0 0.0 - 0.2 %   Comment: Performed at [facility], [address] [place], [place] [address] Procalcitonin     Status: None  Collection Time: 08/15/24  7:03 AM Result Value Ref Range  Procalcitonin <0.10 ng/mL   Comment:        Interpretation: PCT (Procalcitonin) <= 0.5 ng/mL: Systemic infection (sepsis) is not likely. Local bacterial infection is possible. (NOTE)       Sepsis PCT Algorithm           Lower Respiratory Tract                                      Infection PCT Algorithm    ----------------------------     ----------------------------         PCT < 0.25 ng/mL                PCT < 0.10 ng/mL          Strongly encourage             Strongly discourage   discontinuation of antibiotics    initiation of antibiotics    ----------------------------     -----------------------------       PCT 0.25 - 0.50 ng/mL            PCT 0.10 - 0.25 ng/mL               OR       >80% decrease in PCT            Discourage initiation of                                            antibiotics      Encourage discontinuation           of antibiotics    ----------------------------     -----------------------------         PCT >= 0.50 ng/mL              PCT 0.26 - 0.50 ng/mL               AND        <80% decrease in PCT             Encourage initiation of                                             antibiotics       Encourage continuation           of antibiotics    ----------------------------     -----------------------------        PCT >= 0.50 ng/mL                  PCT > 0.50 ng/mL               AND         increase in PCT                  Strongly encourage                                      initiation of antibiotics    Strongly encourage escalation  of antibiotics                                     -----------------------------                                           PCT <= 0.25 ng/mL                                                  OR                                        > 80% decrease in PCT                                      Discontinue / Do not initiate                                             antibiotics  Performed at [facility], [address] [place], [place] [address]  Prepare RBC (crossmatch)     Status: None  Collection Time: 08/15/24  8:07 AM Result Value Ref Range  Order Confirmation     ORDER PROCESSED BY BLOOD BANK Performed at [facility], [address] [place], [place] [address]  Glucose, capillary     Status: Abnormal  Collection Time: 08/15/24  8:37 AM Result Value Ref Range  Glucose-Capillary 166 (H) 70 - 99 mg/dL   Comment: Glucose reference range applies only to samples taken after fasting for at least 8 hours. Glucose, capillary     Status: Abnormal  Collection Time: 08/15/24 11:20 AM Result Value Ref Range  Glucose-Capillary 190 (H) 70 - 99 mg/dL   Comment: Glucose reference range applies only to samples taken after fasting for at least 8 hours. Glucose, capillary     Status: Abnormal  Collection Time: 08/15/24  4:41 PM Result Value Ref Range  Glucose-Capillary 147 (H) 70 - 99 mg/dL   Comment: Glucose reference range applies only to samples taken after fasting for at least 8 hours. Glucose, capillary     Status: Abnormal  Collection Time: 08/15/24  8:21 PM Result Value Ref Range  Glucose-Capillary 212 (H) 70 - 99 mg/dL   Comment: Glucose reference range applies only to samples taken after fasting for at least 8 hours. Glucose, capillary     Status: Abnormal  Collection Time: 08/16/24  1:47 AM Result Value Ref Range  Glucose-Capillary 125 (H) 70 - 99 mg/dL   Comment: Glucose reference range applies only to samples taken after fasting for at least 8 hours. Glucose, capillary     Status: Abnormal  Collection Time: 08/16/24  3:54 AM Result Value Ref Range  Glucose-Capillary 118 (H) 70 - 99 mg/dL   Comment: Glucose reference  range applies only to samples taken after fasting for at least 8 hours. Phosphorus  Status: None  Collection Time: 08/16/24  6:00 AM Result Value Ref Range  Phosphorus 3.2 2.5 - 4.6 mg/dL   Comment: Performed at [facility], [address] [place], [place] [address] CBC     Status: Abnormal  Collection Time: 08/16/24  6:00 AM Result Value Ref Range  WBC 15.4 (H) 4.0 - 10.5 K/uL  RBC 3.32 (L) 4.22 - 5.81 MIL/uL  Hemoglobin 9.4 (L) 13.0 - 17.0 g/dL  HCT 69.4 (L) 60.9 - 47.9 %  MCV 91.9 80.0 - 100.0 fL  MCH 28.3 26.0 - 34.0 pg  MCHC 30.8 30.0 - 36.0 g/dL  RDW 83.9 (H) 88.4 - 84.4 %  Platelets 278 150 - 400 K/uL  nRBC 0.0 0.0 - 0.2 %   Comment: Performed at [facility], [address] [place] [address], [place], [place] [address] Basic metabolic panel with GFR     Status: Abnormal  Collection Time: 08/16/24  6:00 AM Result Value Ref Range  Sodium 135 135 - 145 mmol/L  Potassium 4.1 3.5 - 5.1 mmol/L  Chloride 97 (L) 98 - 111 mmol/L  CO2 29 22 - 32 mmol/L  Glucose, Bld 109 (H) 70 - 99 mg/dL   Comment: Glucose reference range applies only to samples taken after fasting for at least 8 hours.  BUN 30 (H) 8 - 23 mg/dL  Creatinine, Ser 8.63 (H) 0.61 - 1.24 mg/dL  Calcium  8.5 (L) 8.9 - 10.3 mg/dL  GFR, Estimated 58 (L) >60 mL/min   Comment: (NOTE) Calculated using the CKD-EPI Creatinine Equation (2021)   Anion gap 9 5 - 15   Comment: Performed at [facility], [address] [place], [place] [address] Glucose, capillary     Status: Abnormal  Collection Time: 08/16/24  8:22 AM Result Value Ref Range  Glucose-Capillary 102 (H) 70 - 99 mg/dL   Comment: Glucose reference range applies only to samples taken after fasting for at least 8 hours. Glucose, capillary     Status: Abnormal  Collection Time: 08/16/24 12:19 PM Result Value Ref Range  Glucose-Capillary 131 (H) 70 - 99 mg/dL   Comment: Glucose reference range applies only to samples taken after fasting for at  least 8 hours. Glucose, capillary     Status: Abnormal  Collection Time: 08/16/24  4:08 PM Result Value Ref Range  Glucose-Capillary 182 (H) 70 - 99 mg/dL   Comment: Glucose reference range applies only to samples taken after fasting for at least 8 hours. Glucose, capillary     Status: Abnormal  Collection Time: 08/16/24  7:48 PM Result Value Ref Range  Glucose-Capillary 202 (H) 70 - 99 mg/dL   Comment: Glucose reference range applies only to samples taken after fasting for at least 8 hours. Glucose, capillary     Status: Abnormal  Collection Time: 08/16/24 11:42 PM Result Value Ref Range  Glucose-Capillary 174 (H) 70 - 99 mg/dL   Comment: Glucose reference range applies only to samples taken after fasting for at least 8 hours. Glucose, capillary     Status: Abnormal  Collection Time: 08/17/24  5:02 AM Result Value Ref Range  Glucose-Capillary 182 (H) 70 - 99 mg/dL   Comment: Glucose reference range applies only to samples taken after fasting for at least 8 hours. Phosphorus     Status: None  Collection Time: 08/17/24  6:58 AM Result Value Ref Range  Phosphorus 2.9 2.5 - 4.6 mg/dL   Comment: Performed at [facility], [address], [place], [place] [address] CBC  Status: Abnormal  Collection Time: 08/17/24  6:58 AM Result Value Ref Range  WBC 9.6 4.0 - 10.5 K/uL  RBC 3.45 (L) 4.22 - 5.81 MIL/uL  Hemoglobin 9.8 (L) 13.0 - 17.0 g/dL  HCT 68.0 (L) 60.9 - 47.9 %  MCV 92.5 80.0 - 100.0 fL  MCH 28.4 26.0 - 34.0 pg  MCHC 30.7 30.0 - 36.0 g/dL  RDW 84.2 (H) 88.4 - 84.4 %  Platelets 219 150 - 400 K/uL  nRBC 0.0 0.0 - 0.2 %   Comment: Performed at [facility], [address], [place], [place] [address] Basic metabolic panel     Status: Abnormal  Collection Time: 08/17/24  6:58 AM Result Value Ref Range  Sodium 135 135 - 145 mmol/L  Potassium 4.6 3.5 - 5.1 mmol/L  Chloride 97 (L) 98 - 111 mmol/L  CO2 28 22 - 32 mmol/L  Glucose, Bld 173 (H) 70 - 99  mg/dL   Comment: Glucose reference range applies only to samples taken after fasting for at least 8 hours.  BUN 30 (H) 8 - 23 mg/dL  Creatinine, Ser 8.62 (H) 0.61 - 1.24 mg/dL  Calcium  8.4 (L) 8.9 - 10.3 mg/dL  GFR, Estimated 57 (L) >60 mL/min   Comment: (NOTE) Calculated using the CKD-EPI Creatinine Equation (2021)   Anion gap 10 5 - 15   Comment: Performed at [facility], [address] [place], [place] [address] Glucose, capillary     Status: Abnormal  Collection Time: 08/17/24  8:43 AM Result Value Ref Range  Glucose-Capillary 160 (H) 70 - 99 mg/dL   Comment: Glucose reference range applies only to samples taken after fasting for at least 8 hours. Glucose, capillary     Status: Abnormal  Collection Time: 08/17/24 11:44 AM Result Value Ref Range  Glucose-Capillary 216 (H) 70 - 99 mg/dL   Comment: Glucose reference range applies only to samples taken after fasting for at least 8 hours. Glucose, capillary     Status: Abnormal  Collection Time: 08/17/24  4:45 PM Result Value Ref Range  Glucose-Capillary 186 (H) 70 - 99 mg/dL   Comment: Glucose reference range applies only to samples taken after fasting for at least 8 hours. Glucose, capillary     Status: Abnormal  Collection Time: 08/17/24  7:52 PM Result Value Ref Range  Glucose-Capillary 233 (H) 70 - 99 mg/dL   Comment: Glucose reference range applies only to samples taken after fasting for at least 8 hours. Glucose, capillary     Status: Abnormal  Collection Time: 08/17/24 10:54 PM Result Value Ref Range  Glucose-Capillary 192 (H) 70 - 99 mg/dL   Comment: Glucose reference range applies only to samples taken after fasting for at least 8 hours. Glucose, capillary     Status: Abnormal  Collection Time: 08/18/24  4:04 AM Result Value Ref Range  Glucose-Capillary 169 (H) 70 - 99 mg/dL   Comment: Glucose reference range applies only to samples taken after fasting for at least 8 hours. Hemoglobin     Status:  Abnormal  Collection Time: 08/18/24  5:11 AM Result Value Ref Range  Hemoglobin 9.5 (L) 13.0 - 17.0 g/dL   Comment: Performed at [facility], [address] [place], [place] [address] Glucose, capillary     Status: Abnormal  Collection Time: 08/18/24  8:42 AM Result Value Ref Range  Glucose-Capillary 134 (H) 70 - 99 mg/dL   Comment: Glucose reference range applies only to samples taken after fasting for at least 8 hours.  Glucose, capillary     Status: Abnormal  Collection Time: 08/18/24 11:43 AM Result Value Ref Range  Glucose-Capillary 197 (H) 70 - 99 mg/dL   Comment: Glucose reference range applies only to samples taken after fasting for at least 8 hours. Glucose, capillary     Status: Abnormal  Collection Time: 08/18/24  5:59 PM Result Value Ref Range  Glucose-Capillary 302 (H) 70 - 99 mg/dL   Comment: Glucose reference range applies only to samples taken after fasting for at least 8 hours. Glucose, capillary     Status: Abnormal  Collection Time: 08/18/24  8:51 PM Result Value Ref Range  Glucose-Capillary 226 (H) 70 - 99 mg/dL   Comment: Glucose reference range applies only to samples taken after fasting for at least 8 hours. Glucose, capillary     Status: Abnormal  Collection Time: 08/18/24 11:52 PM Result Value Ref Range  Glucose-Capillary 199 (H) 70 - 99 mg/dL   Comment: Glucose reference range applies only to samples taken after fasting for at least 8 hours. Hemoglobin     Status: Abnormal  Collection Time: 08/19/24  5:55 AM Result Value Ref Range  Hemoglobin 9.7 (L) 13.0 - 17.0 g/dL   Comment: Performed at [facility], [address] [place], [place] [address] Basic metabolic panel     Status: Abnormal  Collection Time: 08/19/24  5:55 AM Result Value Ref Range  Sodium 137 135 - 145 mmol/L  Potassium 4.4 3.5 - 5.1 mmol/L  Chloride 98 98 - 111 mmol/L  CO2 30 22 - 32 mmol/L  Glucose, Bld 154 (H) 70 - 99 mg/dL   Comment: Glucose reference range  applies only to samples taken after fasting for at least 8 hours.  BUN 29 (H) 8 - 23 mg/dL  Creatinine, Ser 8.57 (H) 0.61 - 1.24 mg/dL  Calcium  8.5 (L) 8.9 - 10.3 mg/dL  GFR, Estimated 55 (L) >60 mL/min   Comment: (NOTE) Calculated using the CKD-EPI Creatinine Equation (2021)   Anion gap 9 5 - 15   Comment: Performed at [facility], [address] [place], [place] [address] Hemoglobin A1c     Status: None  Collection Time: 08/19/24  5:55 AM Result Value Ref Range  Hgb A1c MFr Bld 5.3 4.8 - 5.6 %   Comment: (NOTE)         Prediabetes: 5.7 - 6.4         Diabetes: >6.4         Glycemic control for adults with diabetes: <7.0   Mean Plasma Glucose 105 mg/dL   Comment: (NOTE) Performed At: [facility] [address] [place], [place] [id number] Jennette Shorter MD Ey:[phone]  Glucose, capillary     Status: Abnormal  Collection Time: 08/19/24  7:25 AM Result Value Ref Range  Glucose-Capillary 269 (H) 70 - 99 mg/dL   Comment: Glucose reference range applies only to samples taken after fasting for at least 8 hours. Glucose, capillary     Status: Abnormal  Collection Time: 08/19/24 12:37 PM Result Value Ref Range  Glucose-Capillary 226 (H) 70 - 99 mg/dL   Comment: Glucose reference range applies only to samples taken after fasting for at least 8 hours. Glucose, capillary     Status: Abnormal  Collection Time: 08/19/24  4:53 PM Result Value Ref Range  Glucose-Capillary 188 (H) 70 - 99 mg/dL   Comment: Glucose reference range applies only to samples taken after fasting for at least 8 hours. Glucose, capillary     Status: Abnormal  Collection Time: 08/19/24  8:52 PM Result Value Ref Range  Glucose-Capillary 173 (H) 70 - 99 mg/dL   Comment: Glucose reference range applies only to samples taken after fasting for at least 8 hours. Glucose, capillary     Status: Abnormal  Collection Time: 08/19/24 11:47 PM Result Value Ref Range  Glucose-Capillary 145 (H) 70 - 99  mg/dL   Comment: Glucose reference range applies only to samples taken after fasting for at least 8 hours. Glucose, capillary     Status: Abnormal  Collection Time: 08/20/24  4:24 AM Result Value Ref Range  Glucose-Capillary 150 (H) 70 - 99 mg/dL   Comment: Glucose reference range applies only to samples taken after fasting for at least 8 hours. Basic metabolic panel with GFR     Status: Abnormal  Collection Time: 08/20/24  6:20 AM Result Value Ref Range  Sodium 138 135 - 145 mmol/L  Potassium 4.3 3.5 - 5.1 mmol/L  Chloride 101 98 - 111 mmol/L  CO2 29 22 - 32 mmol/L  Glucose, Bld 173 (H) 70 - 99 mg/dL   Comment: Glucose reference range applies only to samples taken after fasting for at least 8 hours.  BUN 29 (H) 8 - 23 mg/dL  Creatinine, Ser 8.84 9.38 - 1.24 mg/dL  Calcium  8.4 (L) 8.9 - 10.3 mg/dL  GFR, Estimated >39 >39 mL/min   Comment: (NOTE) Calculated using the CKD-EPI Creatinine Equation (2021)   Anion gap 8 5 - 15   Comment: Performed at [facility], [address] [place], [place] [address] Hemoglobin and hematocrit, blood     Status: Abnormal  Collection Time: 08/20/24  6:20 AM Result Value Ref Range  Hemoglobin 9.4 (L) 13.0 - 17.0 g/dL  HCT 68.9 (L) 60.9 - 47.9 %   Comment: Performed at [facility], [address] [place], [place] [address] Glucose, capillary     Status: Abnormal  Collection Time: 08/20/24  8:03 AM Result Value Ref Range  Glucose-Capillary 200 (H) 70 - 99 mg/dL   Comment: Glucose reference range applies only to samples taken after fasting for at least 8 hours. Glucose, capillary     Status: Abnormal  Collection Time: 08/20/24 12:49 PM Result Value Ref Range  Glucose-Capillary 169 (H) 70 - 99 mg/dL   Comment: Glucose reference range applies only to samples taken after fasting for at least 8 hours. Glucose, capillary     Status: Abnormal  Collection Time: 08/20/24  5:23 PM Result Value Ref Range  Glucose-Capillary 303 (H) 70  - 99 mg/dL   Comment: Glucose reference range applies only to samples taken after fasting for at least 8 hours. Glucose, capillary     Status: Abnormal  Collection Time: 08/20/24  8:14 PM Result Value Ref Range  Glucose-Capillary 192 (H) 70 - 99 mg/dL   Comment: Glucose reference range applies only to samples taken after fasting for at least 8 hours. Glucose, capillary     Status: Abnormal  Collection Time: 08/20/24 11:52 PM Result Value Ref Range  Glucose-Capillary 152 (H) 70 - 99 mg/dL   Comment: Glucose reference range applies only to samples taken after fasting for at least 8 hours. Glucose, capillary     Status: Abnormal  Collection Time: 08/21/24  3:53 AM Result Value Ref Range  Glucose-Capillary 137 (H) 70 - 99 mg/dL   Comment: Glucose reference range applies only to samples taken after fasting for at least 8 hours. Hemoglobin     Status: Abnormal  Collection Time: 08/21/24  5:41 AM Result Value Ref Range  Hemoglobin 9.8 (L) 13.0 - 17.0 g/dL   Comment: Performed at [facility], [address] [place], [place] [address] Basic metabolic panel with GFR     Status: Abnormal  Collection Time: 08/21/24  5:41 AM Result Value Ref Range  Sodium 138 135 - 145 mmol/L  Potassium 4.2 3.5 - 5.1 mmol/L  Chloride 98 98 - 111 mmol/L  CO2 31 22 - 32 mmol/L  Glucose, Bld 120 (H) 70 - 99 mg/dL   Comment: Glucose reference range applies only to samples taken after fasting for at least 8 hours.  BUN 30 (H) 8 - 23 mg/dL  Creatinine, Ser 8.87 9.38 - 1.24 mg/dL  Calcium  8.2 (L) 8.9 - 10.3 mg/dL  GFR, Estimated >39 >39 mL/min   Comment: (NOTE) Calculated using the CKD-EPI Creatinine Equation (2021)   Anion gap 9 5 - 15   Comment: Performed at [facility], [address] [place], [place] [address] Glucose, capillary     Status: Abnormal  Collection Time: 08/21/24  8:43 AM Result Value Ref Range  Glucose-Capillary 112 (H) 70 - 99 mg/dL   Comment: Glucose reference range  applies only to samples taken after fasting for at least 8 hours. Glucose, capillary     Status: None  Collection Time: 08/21/24 11:05 AM Result Value Ref Range  Glucose-Capillary 96 70 - 99 mg/dL   Comment: Glucose reference range applies only to samples taken after fasting for at least 8 hours. Glucose, capillary     Status: Abnormal  Collection Time: 08/21/24  4:17 PM Result Value Ref Range  Glucose-Capillary 219 (H) 70 - 99 mg/dL   Comment: Glucose reference range applies only to samples taken after fasting for at least 8 hours. Glucose, capillary     Status: Abnormal  Collection Time: 08/21/24  8:12 PM Result Value Ref Range  Glucose-Capillary 212 (H) 70 - 99 mg/dL   Comment: Glucose reference range applies only to samples taken after fasting for at least 8 hours. Basic metabolic panel with GFR     Status: Abnormal  Collection Time: 08/22/24  7:37 AM Result Value Ref Range  Sodium 137 135 - 145 mmol/L  Potassium 4.3 3.5 - 5.1 mmol/L  Chloride 96 (L) 98 - 111 mmol/L  CO2 31 22 - 32 mmol/L  Glucose, Bld 135 (H) 70 - 99 mg/dL   Comment: Glucose reference range applies only to samples taken after fasting for at least 8 hours.  BUN 37 (H) 8 - 23 mg/dL  Creatinine, Ser 8.77 9.38 - 1.24 mg/dL  Calcium  8.3 (L) 8.9 - 10.3 mg/dL  GFR, Estimated >39 >39 mL/min   Comment: (NOTE) Calculated using the CKD-EPI Creatinine Equation (2021)   Anion gap 10 5 - 15   Comment: Performed at [facility], [address] [place], [place] [address] CBC     Status: Abnormal  Collection Time: 08/22/24  7:37 AM Result Value Ref Range  WBC 19.6 (H) 4.0 - 10.5 K/uL  RBC 3.34 (L) 4.22 - 5.81 MIL/uL  Hemoglobin 9.6 (L) 13.0 - 17.0 g/dL  HCT 68.0 (L) 60.9 - 47.9 %  MCV 95.5 80.0 - 100.0 fL  MCH 28.7 26.0 - 34.0 pg  MCHC 30.1 30.0 - 36.0 g/dL  RDW 82.6 (H) 88.4 - 84.4 %  Platelets 257 150 - 400 K/uL  nRBC 0.0 0.0 - 0.2 %   Comment: Performed at [facility], [address]  [place], [place] [address]  Glucose, capillary     Status: Abnormal  Collection Time: 08/22/24  8:29 AM Result Value Ref Range  Glucose-Capillary 140 (H) 70 - 99 mg/dL   Comment: Glucose reference range applies only to samples taken after fasting for at least 8 hours. Glucose, capillary     Status: Abnormal  Collection Time: 08/22/24 11:09 AM Result Value Ref Range  Glucose-Capillary 175 (H) 70 - 99 mg/dL   Comment: Glucose reference range applies only to samples taken after fasting for at least 8 hours. Comprehensive metabolic panel     Status: Abnormal  Collection Time: 09/16/24  9:29 AM Result Value Ref Range  Sodium 136 135 - 145 mmol/L  Potassium 3.5 3.5 - 5.1 mmol/L  Chloride 89 (L) 98 - 111 mmol/L  CO2 37 (H) 22 - 32 mmol/L  Glucose, Bld 103 (H) 70 - 99 mg/dL   Comment: Glucose reference range applies only to samples taken after fasting for at least 8 hours.  BUN 17 8 - 23 mg/dL  Creatinine, Ser 8.58 (H) 0.61 - 1.24 mg/dL  Calcium  8.4 (L) 8.9 - 10.3 mg/dL  Total Protein 7.7 6.5 - 8.1 g/dL  Albumin  3.1 (L) 3.5 - 5.0 g/dL  AST 16 15 - 41 U/L  ALT 7 0 - 44 U/L  Alkaline Phosphatase 60 38 - 126 U/L  Total Bilirubin 1.2 0.0 - 1.2 mg/dL  GFR, Estimated 55 (L) >60 mL/min   Comment: (NOTE) Calculated using the CKD-EPI Creatinine Equation (2021)   Anion gap 10 5 - 15   Comment: Performed at [facility], [address], [place], [place] [address] Troponin I (High Sensitivity)     Status: Abnormal  Collection Time: 09/16/24  9:29 AM Result Value Ref Range  Troponin I (High Sensitivity) 30 (H) <18 ng/L   Comment: (NOTE) Elevated high sensitivity troponin I (hsTnI) values and significant  changes across serial measurements may suggest ACS but many other  chronic and acute conditions are known to elevate hsTnI results.  Refer to the Links section for chest pain algorithms and additional  guidance. Performed at [facility], [address] [place], [place]  [address]  CBC with Differential     Status: Abnormal  Collection Time: 09/16/24  9:29 AM Result Value Ref Range  WBC 9.0 4.0 - 10.5 K/uL  RBC 2.77 (L) 4.22 - 5.81 MIL/uL  Hemoglobin 7.6 (L) 13.0 - 17.0 g/dL  HCT 73.8 (L) 60.9 - 47.9 %  MCV 94.2 80.0 - 100.0 fL  MCH 27.4 26.0 - 34.0 pg  MCHC 29.1 (L) 30.0 - 36.0 g/dL  RDW 83.5 (H) 88.4 - 84.4 %  Platelets 354 150 - 400 K/uL  nRBC 0.0 0.0 - 0.2 %  Neutrophils Relative % 72 %  Neutro Abs 6.5 1.7 - 7.7 K/uL  Lymphocytes Relative 10 %  Lymphs Abs 0.9 0.7 - 4.0 K/uL  Monocytes Relative 14 %  Monocytes Absolute 1.3 (H) 0.1 - 1.0 K/uL  Eosinophils Relative 3 %  Eosinophils Absolute 0.2 0.0 - 0.5 K/uL  Basophils Relative 1 %  Basophils Absolute 0.1 0.0 - 0.1 K/uL  Immature Granulocytes 0 %  Abs Immature Granulocytes 0.03 0.00 - 0.07 K/uL   Comment: Performed at [facility], [address] [place], [place] [address] Type and screen [facility]     Status: None  Collection Time: 09/16/24  9:29 AM Result Value Ref Range  ABO/RH(D) A POS   Antibody Screen NEG   Sample Expiration 09/19/2024,2359  Unit Number [id number]   Blood Component Type RED CELLS,LR   Unit division 00   Status of Unit ISSUED,FINAL   Transfusion Status OK TO TRANSFUSE   Crossmatch Result     Compatible Performed at [facility], [address] [place], [place] [address]  Protime-INR     Status: Abnormal  Collection Time: 09/16/24  9:29 AM Result Value Ref Range  Prothrombin Time 16.1 (H) 11.4 - 15.2 seconds  INR 1.2 0.8 - 1.2   Comment: (NOTE) INR goal varies based on device and disease states. Performed at [facility], [address] [place], [place] [address]  Brain natriuretic peptide     Status: Abnormal  Collection Time: 09/16/24  9:29 AM Result Value Ref Range  B Natriuretic Peptide 1,583.6 (H) 0.0 - 100.0 pg/mL   Comment: Performed at [facility], [address]  [place], [place] [address] BPAM RBC     Status: None  Collection Time: 09/16/24  9:29 AM Result Value Ref Range  ISSUE DATE / TIME [id number]   Blood Product Unit Number [id number]   PRODUCT CODE [id number]   Unit Type and Rh 0600   Blood Product Expiration Date [id number]  Prepare RBC (crossmatch)     Status: None  Collection Time: 09/16/24 11:30 AM Result Value Ref Range  Order Confirmation     ORDER PROCESSED BY BLOOD BANK Performed at [facility], [address] [place], [place] [address]  Troponin I (High Sensitivity)     Status: Abnormal  Collection Time: 09/16/24 11:46 AM Result Value Ref Range  Troponin I (High Sensitivity) 29 (H) <18 ng/L   Comment: (NOTE) Elevated high sensitivity troponin I (hsTnI) values and significant  changes across serial measurements may suggest ACS but many other  chronic and acute conditions are known to elevate hsTnI results.  Refer to the Links section for chest pain algorithms and additional  guidance. Performed at [facility], [address] [place], [place] [address]  Urinalysis, w/ Reflex to Culture (Infection Suspected) -Urine, Clean Catch     Status: Abnormal  Collection Time: 09/16/24 12:02 PM Result Value Ref Range  Specimen Source URINE, CLEAN CATCH   Color, Urine YELLOW (A) YELLOW  APPearance CLEAR (A) CLEAR  Specific Gravity, Urine 1.014 1.005 - 1.030  pH 7.0 5.0 - 8.0  Glucose, UA 150 (A) NEGATIVE mg/dL  Hgb urine dipstick NEGATIVE NEGATIVE  Bilirubin Urine NEGATIVE NEGATIVE  Ketones, ur NEGATIVE NEGATIVE mg/dL  Protein, ur NEGATIVE NEGATIVE mg/dL  Nitrite NEGATIVE NEGATIVE  Leukocytes,Ua NEGATIVE NEGATIVE  RBC / HPF 6-10 0 - 5 RBC/hpf  WBC, UA 0-5 0 - 5 WBC/hpf   Comment:        Reflex urine culture not performed if WBC <=10, OR if Squamous epithelial cells >5. If Squamous epithelial cells >5 suggest recollection.   Bacteria, UA RARE (A) NONE SEEN  Squamous Epithelial / HPF 0 0 - 5  /HPF   Comment: Performed at [facility], [address] [place], [place] [address] Basic metabolic panel     Status: Abnormal  Collection Time: 09/17/24  5:14 AM Result Value Ref Range  Sodium 137 135 - 145 mmol/L  Potassium 3.3 (L) 3.5 - 5.1 mmol/L  Chloride 89 (L) 98 - 111 mmol/L  CO2 35 (H) 22 - 32 mmol/L  Glucose, Bld 122 (H) 70 - 99 mg/dL   Comment: Glucose reference range applies only to samples taken after fasting for at least 8 hours.  BUN  20 8 - 23 mg/dL  Creatinine, Ser 8.56 (H) 0.61 - 1.24 mg/dL  Calcium  8.0 (L) 8.9 - 10.3 mg/dL  GFR, Estimated 54 (L) >60 mL/min   Comment: (NOTE) Calculated using the CKD-EPI Creatinine Equation (2021)   Anion gap 13 5 - 15   Comment: Performed at [facility], [address] [place], [place] [address] CBC     Status: Abnormal  Collection Time: 09/17/24  5:14 AM Result Value Ref Range  WBC 12.1 (H) 4.0 - 10.5 K/uL  RBC 2.95 (L) 4.22 - 5.81 MIL/uL  Hemoglobin 8.1 (L) 13.0 - 17.0 g/dL  HCT 72.8 (L) 60.9 - 47.9 %  MCV 91.9 80.0 - 100.0 fL  MCH 27.5 26.0 - 34.0 pg  MCHC 29.9 (L) 30.0 - 36.0 g/dL  RDW 83.0 (H) 88.4 - 84.4 %  Platelets 365 150 - 400 K/uL  nRBC 0.0 0.0 - 0.2 %   Comment: Performed at [facility], [address] [place], [place] [address] CBC     Status: Abnormal  Collection Time: 09/18/24  3:57 AM Result Value Ref Range  WBC 9.8 4.0 - 10.5 K/uL  RBC 2.78 (L) 4.22 - 5.81 MIL/uL  Hemoglobin 7.6 (L) 13.0 - 17.0 g/dL  HCT 73.8 (L) 60.9 - 47.9 %  MCV 93.9 80.0 - 100.0 fL  MCH 27.3 26.0 - 34.0 pg  MCHC 29.1 (L) 30.0 - 36.0 g/dL  RDW 83.4 (H) 88.4 - 84.4 %  Platelets 365 150 - 400 K/uL  nRBC 0.0 0.0 - 0.2 %   Comment: Performed at [facility], [address] [place], [place] [address] Basic metabolic panel with GFR     Status: Abnormal  Collection Time: 09/18/24  3:57 AM Result Value Ref Range  Sodium 136 135 - 145 mmol/L  Potassium 3.7 3.5 - 5.1 mmol/L  Chloride 91  (L) 98 - 111 mmol/L  CO2 35 (H) 22 - 32 mmol/L  Glucose, Bld 153 (H) 70 - 99 mg/dL   Comment: Glucose reference range applies only to samples taken after fasting for at least 8 hours.  BUN 22 8 - 23 mg/dL  Creatinine, Ser 8.32 (H) 0.61 - 1.24 mg/dL  Calcium  8.1 (L) 8.9 - 10.3 mg/dL  GFR, Estimated 45 (L) >60 mL/min   Comment: (NOTE) Calculated using the CKD-EPI Creatinine Equation (2021)   Anion gap 10 5 - 15   Comment: Performed at [facility], [address] [place], [place] [address] CBC     Status: Abnormal  Collection Time: 09/19/24  4:36 AM Result Value Ref Range  WBC 9.9 4.0 - 10.5 K/uL  RBC 2.64 (L) 4.22 - 5.81 MIL/uL  Hemoglobin 7.3 (L) 13.0 - 17.0 g/dL  HCT 75.2 (L) 60.9 - 47.9 %  MCV 93.6 80.0 - 100.0 fL  MCH 27.7 26.0 - 34.0 pg  MCHC 29.6 (L) 30.0 - 36.0 g/dL  RDW 83.4 (H) 88.4 - 84.4 %  Platelets 360 150 - 400 K/uL  nRBC 0.0 0.0 - 0.2 %   Comment: Performed at [facility], [address] [place], [place] [address] Basic metabolic panel with GFR     Status: Abnormal  Collection Time: 09/19/24  4:36 AM Result Value Ref Range  Sodium 136 135 - 145 mmol/L  Potassium 3.8 3.5 - 5.1 mmol/L  Chloride 91 (L) 98 - 111 mmol/L  CO2 36 (H) 22 - 32 mmol/L  Glucose, Bld 136 (H) 70 - 99 mg/dL   Comment: Glucose reference range  applies only to samples taken after fasting for at least 8 hours.  BUN 21 8 - 23 mg/dL  Creatinine, Ser 8.43 (H) 0.61 - 1.24 mg/dL  Calcium  8.2 (L) 8.9 - 10.3 mg/dL  GFR, Estimated 49 (L) >60 mL/min   Comment: (NOTE) Calculated using the CKD-EPI Creatinine Equation (2021)   Anion gap 9 5 - 15   Comment: Performed at [facility], [address] [place], [place] [address]  *Note: Due to a large number of results and/or encounters for the requested time period, some results have not been displayed. A complete set of results can be found in Results Review.   Assessment/Plan: PAD.   The patient has known significant  PAD both from her previous noninvasive studies as well as a CT angiogram from 2 months ago.  This is associated with a penetrating aortic ulcer/dissection.  He does not have any current limb threatening symptoms.  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  I will plan to see him back in a few months with aortoiliac duplex and ABIs.  Benign essential HTN blood pressure control important in reducing the progression of atherosclerotic disease. On appropriate oral medications.     Stage 3a chronic kidney disease (HCC) Hydrate and limit contrast if ever has angiogram.  Follow with non-invasive studies.   Hyperlipidemia lipid control important in reducing the progression of atherosclerotic disease. Continue statin therapy  Aortic Dissection/penetrating ulcer Small dissection seen on the previous CT scan.  He has extensive atherosclerotic peripheral vascular disease but without limb threatening symptoms in a debilitated man with a litany of medical issues, nothing would be recommended from an intervention standpoint and we will continue to monitor this with noninvasive studies.    Selinda Gu 10/17/2024, 11:06 AM   This note was created with Dragon medical transcription system.  Any errors from dictation are unintentional.

## 2024-10-17 NOTE — Assessment & Plan Note (Signed)
 Small dissection seen on the previous CT scan.  He has extensive atherosclerotic peripheral vascular disease but without limb threatening symptoms in a debilitated man with a litany of medical issues, nothing would be recommended from an intervention standpoint and we will continue to monitor this with noninvasive studies.

## 2024-10-17 NOTE — Assessment & Plan Note (Signed)
 On home oxygen.  Poor surgical candidate overall.

## 2024-10-21 NOTE — Progress Notes (Signed)
 Established Patient Visit   Chief Complaint: No chief complaint on file.  Date of Service: 10/21/2024 Date of Birth: 04-24-1961 PCP: Osa Geralds, NP  History of Present Illness: [name] is a 63 y.o.male patient who returns for   1.  CABG x 3 by Dr. Melia at [facility], 05/10/2012  2.  Chronic HFpEF   NYHA class II-III   Etiology:  CAD, HTN   EF  60-65% 05/15/2023  3.  Moderate to severe aortic stenosis  4.  Essential hypertension  5.  Hyperlipidemia  6.  COPD /quit smoking 2018, followed by Dr. Theotis  7.  Status post CVA  8.  Status post left CEA  9.  Chronic kidney disease  10.  EtOH use  11.  Mild dementia  12.  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 Hester.  Status post CABG times 3 performed by Dr. Melia at [facility] 05/10/2012.  The patient has chronic HFpEF.  He has history of COPD, followed by Dr. Theotis.  He quit smoking 2018.  He has known cerebrovascular disease, status post CVA, and left carotid endarterectomy.  Patient admitted for acute HFpEF combination with COPD, 09/16/2024 - 09/24/2024, CT scan inadvertently revealed infrarenal abdominal aortic focal dissection 2.8 cm.    The patient returns today for a visit in the HF clinic.  He denies exertional chest pain.  He has chronic exertional dyspnea due to underlying COPD.  He denies palpitations or heart racing.  He has intermittent peripheral edema.  2D echocardiogram 05/15/2023 revealed LVEF 60-65% with moderate to severe aortic stenosis, calculated aortic valve area 0.8 cm, peak velocity 3.06 m/s, peak gradient 37.5 mmHg, mean gradient 22.0 mmHg.  Patient has been evaluated by Dr. Katina for TAVR with possible shockwave to left iliac to facilitate sheath insertion.  Comes to clinic today and he continues to report that he is short of breath with minimal exertion.  He has no lower extremity edema currently and no orthopnea but is unable to take more than a few steps.  It is hard for him  to describe whether this is related to his cardiac limitations or his lungs.  He has been take his medications without issue and does not have to take additional diuretics.    Past Medical and Surgical History  Past Medical History Past Medical History:  Diagnosis Date  . Coronary artery disease   . CVA (cerebral vascular accident) (CMS/HHS-HCC)   . GERD (gastroesophageal reflux disease)   . HTN (hypertension)   . STEMI (ST elevation myocardial infarction) (CMS/HHS-HCC) 05/09/2012    Past Surgical History He has a past surgical history that includes Carotid endarterectomy (Left); Coronary artery bypass graft (05/10/2012); and EGD @ [facility] (10/28/2022).   Medications and Allergies  Current Medications  Current Outpatient Medications  Medication Sig Dispense Refill  . albuterol  MDI, PROVENTIL , VENTOLIN , PROAIR , HFA 90 mcg/actuation inhaler Inhale 2 inhalations into the lungs every 4 (four) hours as needed for Wheezing or Shortness of Breath 1 each 3  . dapagliflozin  propanediol (FARXIGA ) 10 mg tablet Take 10 mg by mouth once daily    . fluticasone -umeclidinium-vilanterol (TRELEGY ELLIPTA ) 100-62.5-25 mcg inhaler Inhale 1 Puff into the lungs once daily 60 each 1  . isosorbide  mononitrate (IMDUR ) 60 MG ER tablet Take 60 mg by mouth once daily    . melatonin 10 mg Cap Take by mouth    . metoprolol  SUCCinate (TOPROL -XL) 25 MG XL tablet Take 12.5 mg by mouth once daily    .  naltrexone  (REVIA ) 50 mg tablet Take 50 mg by mouth once daily    . polyethylene glycol (MIRALAX ) packet Take 17 g by mouth once daily Mix in 4-8ounces of fluid prior to taking.    . potassium chloride  (KLOR-CON  M20) 20 MEQ ER tablet Take 1 tablet (20 mEq total) by mouth 2 (two) times daily 60 tablet 6  . pravastatin  (PRAVACHOL ) 20 MG tablet Take 1 tablet (20 mg total) by mouth at bedtime 90 tablet 3  . spironolactone  (ALDACTONE ) 25 MG tablet Take 12.5 mg by mouth once daily    . TORsemide  (DEMADEX ) 10 MG tablet Take 10 mg  by mouth once daily    . TORsemide  (DEMADEX ) 20 MG tablet Take 1 tablet (20 mg total) by mouth 2 (two) times daily 60 tablet 6   No current facility-administered medications for this visit.    Allergies: Bupropion   Social and Family History  Social History  reports that he quit smoking about 4 years ago. His smoking use included cigarettes. He started smoking about 44 years ago. He has a 10 pack-year smoking history. His smokeless tobacco use includes chew. He reports current alcohol  use of about 1.0 standard drink of alcohol  per week. He reports that he does not currently use drugs.  Family History Family History  Problem Relation Name Age of Onset  . No Known Problems Mother    . No Known Problems Father      Review of Systems   Review of Systems: The patient denies chest pain, shortness of breath, orthopnea, paroxysmal nocturnal dyspnea, pedal edema, palpitations, heart racing, presyncope, syncope. Review of 10 Systems is negative except as described above.  Physical Examination   Vitals:BP 120/78   Pulse 102   Ht 182.9 cm (6')   Wt 93 kg (205 lb)   SpO2 99%   BMI 27.80 kg/m  Ht:182.9 cm (6') Wt:93 kg (205 lb) ADJ:Anib surface area is 2.17 meters squared. Body mass index is 27.8 kg/m.  HEENT: Sclera anicteric Lungs: poor air excursion Heart: Regular rate and rhythm.  No gallops, murmurs or rub Abdomen: soft  Extremities: no edema   Assessment   63 y.o. male with AS and HFpEF  63 year old gentleman status post CABG times 3,05/10/2012, currently without chest pain.  2D echocardiogram 05/15/2023 revealed LVEF 60-65% with moderate to severe aortic stenosis (0.8 cm / 3.06 m/s / 37.5 mmHg / 22.0 mmHg).    His heart failure symptoms seem clearly related to his aortic stenosis.  He does not have any decrease in his heart function.  I think his best option would be for proceeding with TAVR as he would not be a surgical candidate from his lung disease.  Medical standpoint  he is on a good regimen that controls his blood pressure.  I think further titration runs the risk of making him hypotensive with a fixed obstruction in the aortic position.  Volume status was good today.    Return in about 6 months (around 04/20/2025).  CHETAN B PATEL, MD

## 2024-10-22 ENCOUNTER — Observation Stay
Admit: 2024-10-22 | Discharge: 2024-10-22 | Disposition: A | Discharge status: Home / Self Care | Attending: Internal Medicine | Admitting: Internal Medicine

## 2024-10-22 ENCOUNTER — Other Ambulatory Visit: Payer: Self-pay

## 2024-10-22 ENCOUNTER — Inpatient Hospital Stay
Admission: EM | Admit: 2024-10-22 | Discharge: 2024-10-30 | DRG: 811 | Disposition: A | Discharge status: Home Health | Attending: Family Medicine | Admitting: Family Medicine

## 2024-10-22 DIAGNOSIS — Z8673 Personal history of transient ischemic attack (TIA), and cerebral infarction without residual deficits: Secondary | ICD-10-CM

## 2024-10-22 DIAGNOSIS — Z9981 Dependence on supplemental oxygen: Secondary | ICD-10-CM

## 2024-10-22 DIAGNOSIS — Z955 Presence of coronary angioplasty implant and graft: Secondary | ICD-10-CM

## 2024-10-22 DIAGNOSIS — Z6829 Body mass index (BMI) 29.0-29.9, adult: Secondary | ICD-10-CM

## 2024-10-22 DIAGNOSIS — Z7951 Long term (current) use of inhaled steroids: Secondary | ICD-10-CM

## 2024-10-22 DIAGNOSIS — K5521 Angiodysplasia of colon with hemorrhage: Secondary | ICD-10-CM | POA: Diagnosis present

## 2024-10-22 DIAGNOSIS — R079 Chest pain, unspecified: Secondary | ICD-10-CM

## 2024-10-22 DIAGNOSIS — I2510 Atherosclerotic heart disease of native coronary artery without angina pectoris: Secondary | ICD-10-CM | POA: Diagnosis present

## 2024-10-22 DIAGNOSIS — I350 Nonrheumatic aortic (valve) stenosis: Secondary | ICD-10-CM | POA: Diagnosis present

## 2024-10-22 DIAGNOSIS — I10 Essential (primary) hypertension: Secondary | ICD-10-CM | POA: Diagnosis present

## 2024-10-22 DIAGNOSIS — I25119 Atherosclerotic heart disease of native coronary artery with unspecified angina pectoris: Secondary | ICD-10-CM | POA: Diagnosis present

## 2024-10-22 DIAGNOSIS — Z888 Allergy status to other drugs, medicaments and biological substances status: Secondary | ICD-10-CM

## 2024-10-22 DIAGNOSIS — D62 Acute posthemorrhagic anemia: Principal | ICD-10-CM | POA: Diagnosis present

## 2024-10-22 DIAGNOSIS — Z8674 Personal history of sudden cardiac arrest: Secondary | ICD-10-CM

## 2024-10-22 DIAGNOSIS — D500 Iron deficiency anemia secondary to blood loss (chronic): Secondary | ICD-10-CM | POA: Diagnosis present

## 2024-10-22 DIAGNOSIS — N1831 Chronic kidney disease, stage 3a: Secondary | ICD-10-CM | POA: Diagnosis present

## 2024-10-22 DIAGNOSIS — D649 Anemia, unspecified: Secondary | ICD-10-CM | POA: Diagnosis not present

## 2024-10-22 DIAGNOSIS — Z8616 Personal history of COVID-19: Secondary | ICD-10-CM

## 2024-10-22 DIAGNOSIS — K922 Gastrointestinal hemorrhage, unspecified: Secondary | ICD-10-CM | POA: Diagnosis present

## 2024-10-22 DIAGNOSIS — Z720 Tobacco use: Secondary | ICD-10-CM

## 2024-10-22 DIAGNOSIS — Z7982 Long term (current) use of aspirin: Secondary | ICD-10-CM

## 2024-10-22 DIAGNOSIS — K921 Melena: Secondary | ICD-10-CM

## 2024-10-22 DIAGNOSIS — D72829 Elevated white blood cell count, unspecified: Secondary | ICD-10-CM | POA: Diagnosis present

## 2024-10-22 DIAGNOSIS — E663 Overweight: Secondary | ICD-10-CM | POA: Diagnosis present

## 2024-10-22 DIAGNOSIS — D509 Iron deficiency anemia, unspecified: Secondary | ICD-10-CM | POA: Diagnosis present

## 2024-10-22 DIAGNOSIS — I5032 Chronic diastolic (congestive) heart failure: Secondary | ICD-10-CM | POA: Diagnosis present

## 2024-10-22 DIAGNOSIS — Z79899 Other long term (current) drug therapy: Secondary | ICD-10-CM

## 2024-10-22 DIAGNOSIS — J449 Chronic obstructive pulmonary disease, unspecified: Secondary | ICD-10-CM | POA: Diagnosis present

## 2024-10-22 DIAGNOSIS — F1020 Alcohol dependence, uncomplicated: Secondary | ICD-10-CM | POA: Diagnosis present

## 2024-10-22 DIAGNOSIS — Z951 Presence of aortocoronary bypass graft: Secondary | ICD-10-CM

## 2024-10-22 DIAGNOSIS — I130 Hypertensive heart and chronic kidney disease with heart failure and stage 1 through stage 4 chronic kidney disease, or unspecified chronic kidney disease: Secondary | ICD-10-CM | POA: Diagnosis present

## 2024-10-22 DIAGNOSIS — I252 Old myocardial infarction: Secondary | ICD-10-CM

## 2024-10-22 DIAGNOSIS — Z7902 Long term (current) use of antithrombotics/antiplatelets: Secondary | ICD-10-CM

## 2024-10-22 DIAGNOSIS — I739 Peripheral vascular disease, unspecified: Secondary | ICD-10-CM | POA: Diagnosis present

## 2024-10-22 DIAGNOSIS — J9611 Chronic respiratory failure with hypoxia: Secondary | ICD-10-CM | POA: Diagnosis present

## 2024-10-22 DIAGNOSIS — K219 Gastro-esophageal reflux disease without esophagitis: Secondary | ICD-10-CM | POA: Diagnosis present

## 2024-10-22 LAB — ECHOCARDIOGRAM COMPLETE
AR max vel: 0.76 cm2
AV Area VTI: 0.66 cm2
AV Area mean vel: 0.68 cm2
AV Mean grad: 25.5 mmHg
AV Peak grad: 43.7 mmHg
Ao pk vel: 3.31 m/s
Area-P 1/2: 6.17 cm2
Height: 72 in
MV VTI: 1.54 cm2
S' Lateral: 4.9 cm
Weight: 3305.14 [oz_av]

## 2024-10-22 LAB — CBC WITH DIFFERENTIAL/PLATELET
Abs Immature Granulocytes: 0.04 K/uL (ref 0.00–0.07)
Basophils Absolute: 0.1 K/uL (ref 0.0–0.1)
Basophils Relative: 1 %
Eosinophils Absolute: 0.2 K/uL (ref 0.0–0.5)
Eosinophils Relative: 2 %
HCT: 17.1 % — ABNORMAL LOW (ref 39.0–52.0)
Hemoglobin: 5 g/dL — ABNORMAL LOW (ref 13.0–17.0)
Immature Granulocytes: 0 %
Lymphocytes Relative: 17 %
Lymphs Abs: 1.8 K/uL (ref 0.7–4.0)
MCH: 24.9 pg — ABNORMAL LOW (ref 26.0–34.0)
MCHC: 29.2 g/dL — ABNORMAL LOW (ref 30.0–36.0)
MCV: 85.1 fL (ref 80.0–100.0)
Monocytes Absolute: 1.1 K/uL — ABNORMAL HIGH (ref 0.1–1.0)
Monocytes Relative: 10 %
Neutro Abs: 7.4 K/uL (ref 1.7–7.7)
Neutrophils Relative %: 70 %
Platelets: 345 K/uL (ref 150–400)
RBC: 2.01 MIL/uL — ABNORMAL LOW (ref 4.22–5.81)
RDW: 15.8 % — ABNORMAL HIGH (ref 11.5–15.5)
WBC: 10.7 K/uL — ABNORMAL HIGH (ref 4.0–10.5)
nRBC: 0 % (ref 0.0–0.2)

## 2024-10-22 LAB — COMPREHENSIVE METABOLIC PANEL WITH GFR
ALT: 9 U/L (ref 0–44)
AST: 17 U/L (ref 15–41)
Albumin: 3.7 g/dL (ref 3.5–5.0)
Alkaline Phosphatase: 43 U/L (ref 38–126)
Anion gap: 13 (ref 5–15)
BUN: 33 mg/dL — ABNORMAL HIGH (ref 8–23)
CO2: 26 mmol/L (ref 22–32)
Calcium: 9 mg/dL (ref 8.9–10.3)
Chloride: 99 mmol/L (ref 98–111)
Creatinine, Ser: 1.59 mg/dL — ABNORMAL HIGH (ref 0.61–1.24)
GFR, Estimated: 48 mL/min — ABNORMAL LOW (ref >60)
Glucose, Bld: 107 mg/dL — ABNORMAL HIGH (ref 70–99)
Potassium: 4 mmol/L (ref 3.5–5.1)
Sodium: 138 mmol/L (ref 135–145)
Total Bilirubin: 1.1 mg/dL (ref 0.0–1.2)
Total Protein: 8.5 g/dL — ABNORMAL HIGH (ref 6.5–8.1)

## 2024-10-22 LAB — HEMOGLOBIN A1C
Hgb A1c MFr Bld: 4.2 % — ABNORMAL LOW (ref 4.8–5.6)
Mean Plasma Glucose: 73.84 mg/dL

## 2024-10-22 LAB — TROPONIN I (HIGH SENSITIVITY)
Troponin I (High Sensitivity): 16 ng/L (ref <18)
Troponin I (High Sensitivity): 18 ng/L — ABNORMAL HIGH (ref <18)

## 2024-10-22 LAB — PREPARE RBC (CROSSMATCH)

## 2024-10-22 MED ORDER — SODIUM CHLORIDE 0.9 % IV SOLN: 250.0000 mL | INTRAVENOUS | Status: AC | PRN

## 2024-10-22 MED ORDER — ACETAMINOPHEN 650 MG RE SUPP: 650.0000 mg | Freq: Four times a day (QID) | RECTAL | Status: DC | PRN

## 2024-10-22 MED ORDER — GABAPENTIN 300 MG PO CAPS
300.0000 mg | ORAL_CAPSULE | Freq: Two times a day (BID) | ORAL | Status: DC
  Administered 2024-10-22 – 2024-10-27 (×10): 300 mg via ORAL
  Filled 2024-10-22 (×10): qty 1

## 2024-10-22 MED ORDER — HYDRALAZINE HCL 20 MG/ML IJ SOLN: 5.0000 mg | Freq: Four times a day (QID) | INTRAMUSCULAR | Status: DC | PRN

## 2024-10-22 MED ORDER — SODIUM CHLORIDE 0.9% FLUSH
3.0000 mL | Freq: Two times a day (BID) | INTRAVENOUS | Status: DC
  Administered 2024-10-22 – 2024-10-29 (×15): 3 mL via INTRAVENOUS

## 2024-10-22 MED ORDER — ONDANSETRON HCL 4 MG PO TABS: 4.0000 mg | ORAL_TABLET | Freq: Four times a day (QID) | ORAL | Status: DC | PRN

## 2024-10-22 MED ORDER — ACETAMINOPHEN 325 MG PO TABS
650.0000 mg | ORAL_TABLET | Freq: Four times a day (QID) | ORAL | Status: DC | PRN
  Administered 2024-10-26 – 2024-10-27 (×3): 650 mg via ORAL
  Filled 2024-10-22 (×3): qty 2

## 2024-10-22 MED ORDER — PANTOPRAZOLE SODIUM 40 MG IV SOLR
40.0000 mg | Freq: Two times a day (BID) | INTRAVENOUS | Status: DC
  Administered 2024-10-22 – 2024-10-29 (×14): 40 mg via INTRAVENOUS
  Filled 2024-10-22 (×16): qty 10

## 2024-10-22 MED ORDER — ISOSORBIDE MONONITRATE ER 60 MG PO TB24
30.0000 mg | ORAL_TABLET | Freq: Every day | ORAL | Status: DC
  Administered 2024-10-23 – 2024-10-24 (×2): 30 mg via ORAL
  Filled 2024-10-22 (×2): qty 1

## 2024-10-22 MED ORDER — GABAPENTIN 300 MG PO CAPS: 300.0000 mg | ORAL_CAPSULE | Freq: Two times a day (BID) | ORAL | Status: DC

## 2024-10-22 MED ORDER — THIAMINE MONONITRATE 100 MG PO TABS
100.0000 mg | ORAL_TABLET | Freq: Every day | ORAL | Status: DC
  Administered 2024-10-23 – 2024-10-30 (×8): 100 mg via ORAL
  Filled 2024-10-22 (×9): qty 1

## 2024-10-22 MED ORDER — ALBUTEROL SULFATE (2.5 MG/3ML) 0.083% IN NEBU: 3.0000 mL | INHALATION_SOLUTION | Freq: Four times a day (QID) | RESPIRATORY_TRACT | Status: DC | PRN

## 2024-10-22 MED ORDER — METOPROLOL SUCCINATE ER 25 MG PO TB24
25.0000 mg | ORAL_TABLET | Freq: Every day | ORAL | Status: DC
Start: 2024-10-23 — End: 2024-10-25
  Administered 2024-10-23 – 2024-10-24 (×2): 25 mg via ORAL
  Filled 2024-10-22 (×3): qty 1

## 2024-10-22 MED ORDER — PANTOPRAZOLE SODIUM 40 MG IV SOLR
80.0000 mg | Freq: Once | INTRAVENOUS | Status: AC
  Administered 2024-10-22: 80 mg via INTRAVENOUS
  Filled 2024-10-22: qty 20

## 2024-10-22 MED ORDER — SODIUM CHLORIDE 0.9% FLUSH: 3.0000 mL | INTRAVENOUS | Status: DC | PRN

## 2024-10-22 MED ORDER — FOLIC ACID 1 MG PO TABS
1.0000 mg | ORAL_TABLET | Freq: Every day | ORAL | Status: DC
  Administered 2024-10-22 – 2024-10-30 (×9): 1 mg via ORAL
  Filled 2024-10-22 (×10): qty 1

## 2024-10-22 MED ORDER — SODIUM CHLORIDE 0.9% IV SOLUTION: Freq: Once | INTRAVENOUS | Status: AC

## 2024-10-22 MED ORDER — BUDESON-GLYCOPYRROL-FORMOTEROL 160-9-4.8 MCG/ACT IN AERO
2.0000 | INHALATION_SPRAY | Freq: Two times a day (BID) | RESPIRATORY_TRACT | Status: DC
  Administered 2024-10-22 – 2024-10-30 (×16): 2 via RESPIRATORY_TRACT
  Filled 2024-10-22: qty 5.9

## 2024-10-22 MED ORDER — ONDANSETRON HCL 4 MG/2ML IJ SOLN: 4.0000 mg | Freq: Four times a day (QID) | INTRAMUSCULAR | Status: DC | PRN

## 2024-10-22 NOTE — ED Triage Notes (Signed)
 Patient to ED via [facility] for intermittent chest pain since last night; also reports increased shortness of breath and dark stools for several months.

## 2024-10-22 NOTE — Consult Note (Signed)
 Rogelia Copping, MD [facility]  [address], [address] [place], [place] [address] Phone: [phone] Fax : [phone]  Consultation  Referring Provider:     Dr. Sonjia Primary Care Physician:  Osa Geralds, NP Primary Gastroenterologist: [facility]         Reason for Consultation:     GI bleeding  Date of Admission:  10/22/2024 Date of Consultation:  10/22/2024         HPI:   [name] is a 63 y.o. male who has had an extensive workup for anemia in the past.  The patient has a history of COPD, CVA, CAD, severe aortic stenosis and a NSTEMI back in July of this year.  The patient has had multiple endoscopies with a colonoscopy in 2023 with a upper endoscopy also in 2023.  The patient then underwent another upper endoscopy in May 2024 with a subsequent small bowel enteroscopy in May 2024 by Dr. Therisa with AVMs cauterized.  The patient underwent a subsequent small bowel enteroscopy in June 2024 and a repeat colonoscopy in May 2025. The patient underwent a capsule endoscopy in June 2024 that showed distal small bowel AVMs with a subsequent colonoscopy not showing the site of bleeding. The patient reports that he has black stools regularly but reports them to be hard stools. At the last admission Dr. Therisa had reported that due to the patient's history of aortic stenosis heart failure and small bowel AVMs with a NSTEMI complicated by respiratory failure and cardiac arrest that conservative management should be performed with a patient undergoing a balloon enteroscopy at a tertiary care center since we do not have the equipment to evaluate the full length of the small intestines.  Past Medical History:  Diagnosis Date   COPD (chronic obstructive pulmonary disease) (HCC)    CVA (cerebral infarction)    Headache    mild, since stroke 2012   Hypertension    MI (myocardial infarction) (HCC) 05/09/2012   Reading difficulty    pt reports he reads at about a second grade level    Stroke [facility]) 2012   numbness to left hand    Wears dentures    full upper and lower    Past Surgical History:  Procedure Laterality Date   CAROTID ENDARTERECTOMY Left    2012 or 2013   CHOLECYSTECTOMY N/A 07/26/2016   Procedure: LAPAROSCOPIC CHOLECYSTECTOMY;  Surgeon: Charlie FORBES Fell, MD;  Location: [facility] ORS;  Service: General;  Laterality: N/A;   COLONOSCOPY N/A 05/08/2024   Procedure: COLONOSCOPY;  Surgeon: Copping Rogelia, MD;  Location: [facility] ENDOSCOPY;  Service: Endoscopy;  Laterality: N/A;   COLONOSCOPY WITH PROPOFOL  N/A 02/09/2022   Procedure: COLONOSCOPY WITH PROPOFOL ;  Surgeon: Copping Rogelia, MD;  Location: [facility] ENDOSCOPY;  Service: Endoscopy;  Laterality: N/A;   CORONARY ARTERY BYPASS GRAFT  05/10/2012   3 vessel   CORONARY STENT INTERVENTION N/A 07/02/2024   Procedure: CORONARY STENT INTERVENTION;  Surgeon: Katina Albright, MD;  Location: [facility] INVASIVE CV LAB;  Service: Cardiovascular;  Laterality: N/A;   CORONARY ULTRASOUND/IVUS N/A 07/02/2024   Procedure: Coronary Ultrasound/IVUS;  Surgeon: Katina Albright, MD;  Location: [facility] INVASIVE CV LAB;  Service: Cardiovascular;  Laterality: N/A;   ENTEROSCOPY N/A 05/16/2023   Procedure: ENTEROSCOPY;  Surgeon: Therisa Bi, MD;  Location: [facility] ENDOSCOPY;  Service: Gastroenterology;  Laterality: N/A;   ENTEROSCOPY N/A 06/01/2023   Procedure: ENTEROSCOPY;  Surgeon: Unk Corinn Skiff, MD;  Location: [facility] ENDOSCOPY;  Service: Gastroenterology;  Laterality: N/A;  ESOPHAGOGASTRODUODENOSCOPY  05/13/2023   Procedure: ESOPHAGOGASTRODUODENOSCOPY (EGD);  Surgeon: Jinny Carmine, MD;  Location: [facility] ENDOSCOPY;  Service: Endoscopy;;   ESOPHAGOGASTRODUODENOSCOPY (EGD) WITH PROPOFOL  N/A 10/28/2022   Procedure: ESOPHAGOGASTRODUODENOSCOPY (EGD) WITH PROPOFOL ;  Surgeon: Onita Elspeth Sharper, DO;  Location: [facility] ENDOSCOPY;  Service: Gastroenterology;  Laterality: N/A;   GIVENS CAPSULE STUDY  05/13/2023   Procedure: GIVENS CAPSULE STUDY;  Surgeon: Jinny Carmine, MD;   Location: [facility] ENDOSCOPY;  Service: Endoscopy;;   GIVENS CAPSULE STUDY  06/01/2023   Procedure: GIVENS CAPSULE STUDY;  Surgeon: Unk Corinn Skiff, MD;  Location: [facility] ENDOSCOPY;  Service: Gastroenterology;;   LEFT HEART CATH AND CORS/GRAFTS ANGIOGRAPHY N/A 07/02/2024   Procedure: LEFT HEART CATH AND CORS/GRAFTS ANGIOGRAPHY;  Surgeon: Katina Albright, MD;  Location: [facility] INVASIVE CV LAB;  Service: Cardiovascular;  Laterality: N/A;    Prior to Admission medications   Medication Sig Start Date End Date Taking? Authorizing Provider  albuterol  (VENTOLIN  HFA) 108 (90 Base) MCG/ACT inhaler Inhale 2 puffs into the lungs every 6 (six) hours as needed for wheezing or shortness of breath. 08/19/24   Josette Ade, MD  amiodarone  (PACERONE ) 200 MG tablet Take 1 tablet (200 mg total) by mouth daily. 08/22/24   Jerelene Critchley, MD  ascorbic acid  (VITAMIN C ) 500 MG tablet Take 1 tablet (500 mg total) by mouth daily. 08/19/24 11/17/24  Josette Ade, MD  aspirin  EC 81 MG tablet Take 1 tablet (81 mg total) by mouth daily. Swallow whole. 08/22/24 02/18/25  Jerelene Critchley, MD  clopidogrel  (PLAVIX ) 75 MG tablet Take 1 tablet (75 mg total) by mouth daily with breakfast. 08/19/24   Josette Ade, MD  dapagliflozin  propanediol (FARXIGA ) 10 MG TABS tablet Take 1 tablet (10 mg total) by mouth daily. 09/22/24     dextromethorphan -guaiFENesin  (MUCINEX  DM) 30-600 MG 12hr tablet Take 1 tablet by mouth 2 (two) times daily as needed for cough. 08/19/24   Josette Ade, MD  Fe Fum-Vit C-Vit B12-FA (TRIGELS-F FORTE) CAPS capsule Take 1 capsule by mouth 2 (two) times daily. 08/22/24   Jerelene Critchley, MD  Fluticasone -Umeclidin-Vilant (TRELEGY ELLIPTA ) 200-62.5-25 MCG/ACT AEPB Inhale 1 Inhalation into the lungs daily at 12 noon. 08/22/24   Jerelene Critchley, MD  folic acid  (KP FOLIC ACID ) 1 MG tablet Take 1 mg by mouth daily. 08/01/24   [provider]  gabapentin  (NEURONTIN ) 300 MG capsule Take 1 capsule (300 mg total) by mouth 2  (two) times daily. 08/22/24   Jerelene Critchley, MD  isosorbide  mononitrate (IMDUR ) 30 MG 24 hr tablet Take 1 tablet (30 mg total) by mouth daily. 08/22/24   Jerelene Critchley, MD  lactulose  (CHRONULAC ) 10 GM/15ML solution Take 30 mLs (20 g total) by mouth daily as needed for moderate constipation or mild constipation. 08/19/24   Josette Ade, MD  melatonin 5 MG TABS Place 1 tablet (5 mg total) into feeding tube at bedtime as needed (sleep). 08/19/24   Josette Ade, MD  metoprolol  succinate (TOPROL -XL) 25 MG 24 hr tablet Take 1 tablet (25 mg total) by mouth daily. 08/22/24   Jerelene Critchley, MD  Multiple Vitamin (MULTIVITAMIN WITH MINERALS) TABS tablet Take 1 tablet by mouth daily. 08/19/24   Josette Ade, MD  nicotine  (NICODERM CQ  - DOSED IN MG/24 HR) 7 mg/24hr patch One 7mg  patch chest wall daily 08/19/24   Josette Ade, MD  pantoprazole  (PROTONIX ) 40 MG tablet Take 1 tablet (40 mg total) by mouth daily. 08/22/24 10/21/24  Jerelene Critchley, MD  polyethylene glycol powder (GLYCOLAX /MIRALAX ) 17 GM/SCOOP powder Take 17 g by  mouth at bedtime. Mix as directed. 08/22/24   Jerelene Critchley, MD  rosuvastatin  (CRESTOR ) 20 MG tablet Take 1 tablet (20 mg total) by mouth daily. 09/22/24     thiamine  (VITAMIN B-1) 100 MG tablet Take 1 tablet (100 mg total) by mouth daily. 08/19/24   Josette Ade, MD  torsemide  (DEMADEX ) 20 MG tablet Take 1 tablet (20 mg total) by mouth daily. 08/22/24   Jerelene Critchley, MD  TRUE VITAMIN D3 10 MCG (400 UNIT) CAPS Take 2 capsules by mouth daily. 06/06/24   [provider]  Vitamin D , Ergocalciferol , (DRISDOL ) 1.25 MG (50000 UNIT) CAPS capsule Take 1 capsule (50,000 Units total) by mouth every 7 (seven) days. 08/19/24   Josette Ade, MD    Family History  Problem Relation Age of Onset   Pneumonia Mother      Social History   Tobacco Use   Smoking status: Former    Current packs/day: 0.00    Types: Cigarettes    Quit date: 1993    Years since quitting: 32.8    Smokeless tobacco: Current    Types: Snuff   Tobacco comments:    changed to dip  Vaping Use   Vaping status: Never Used  Substance Use Topics   Alcohol  use: Not Currently    Alcohol /week: 26.0 standard drinks of alcohol     Types: 26 Cans of beer per week   Drug use: No    Allergies as of 10/22/2024 - Review Complete 10/22/2024  Allergen Reaction Noted   Wellbutrin  [bupropion ]  06/13/2024    Review of Systems:    All systems reviewed and negative except where noted in HPI.   Physical Exam:  Vital signs in last 24 hours: Temp:  [97.8 F (36.6 C)-98 F (36.7 C)] 97.8 F (36.6 C) (11/05 1249) Pulse Rate:  [94-115] 95 (11/05 1249) Resp:  [15-20] 20 (11/05 1249) BP: (97-110)/(59-72) 104/69 (11/05 1249) SpO2:  [99 %-100 %] 99 % (11/05 1249) Weight:  [93 kg] 93 kg (11/05 1022)   General:   Pleasant, cooperative in NAD Head:  Normocephalic and atraumatic. Eyes:   No icterus.   Conjunctiva pale. PERRLA. Ears:  Normal auditory acuity. Neck:  Supple; no masses or thyroidomegaly Lungs: Respirations even and unlabored. Lungs clear to auscultation bilaterally.   No wheezes, crackles, or rhonchi.  Heart:  Regular rate and rhythm;  Without murmur, clicks, rubs or gallops Abdomen:  Soft, nondistended, nontender. Normal bowel sounds. No appreciable masses or hepatomegaly.  No rebound or guarding.  Rectal:  Not performed. Msk:  Symmetrical without gross deformities.    Extremities:  Without edema, cyanosis or clubbing. Neurologic:  Alert and oriented x3;  grossly normal neurologically. Skin:  Intact without significant lesions or rashes. Cervical Nodes:  No significant cervical adenopathy. Psych:  Alert and cooperative. Normal affect.  LAB RESULTS: Recent Labs    10/22/24 1031  WBC 10.7*  HGB 5.0*  HCT 17.1*  PLT 345   BMET Recent Labs    10/22/24 1031  NA 138  K 4.0  CL 99  CO2 26  GLUCOSE 107*  BUN 33*  CREATININE 1.59*  CALCIUM  9.0   LFT Recent Labs     10/22/24 1031  PROT 8.5*  ALBUMIN  3.7  AST 17  ALT 9  ALKPHOS 43  BILITOT 1.1   PT/INR No results for input(s): LABPROT, INR in the last 72 hours.  STUDIES: No results found.    Impression / Plan:   Assessment: Principal Problem:   Symptomatic  anemia Active Problems:   Essential hypertension   Iron  deficiency anemia due to chronic blood loss   GI bleeding   CAD (coronary artery disease)   Severe aortic stenosis   An Wade Klingerman is a 63 y.o. y/o male with known AVMs in the small bowel that have not been reachable with a push enteroscopy and colonoscopy with intubation of the terminal ileum.  It was recommended that the patient go to a tertiary care center for double-balloon enteroscopy such as [facility] or [facility].  The recommendations by Dr. 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  The definitive treatment is to fix the aortic stenosis.  Short of that we can go and try and fix the AVMs in the small bowel with the balloon enteroscopy which needs to be performed at [facility] or a [facility] since we do not have the equipment here.  With recent NSTEMI, acute respiratory failure on this admission, recent cardiac arrest , acute COVID, elevated troponin, severe aortic stenosis he would be extremely high risk for anesthesia and any endoscopy intervention.  My recommendation would be to perform any endoscopic intervention only if his hemorrhaging and it would be the only option to save his life.  It is a balance between risk versus benefit ratio that would determine any endoscopy .   And I do not disagree with that plan.  Plan:  The patient should undergo supportive care due to the patient's high risk as reported in the note above.  The patient will need follow-up at [facility] or [facility] for double-balloon enteroscopy and the patient states that he is  supposed to follow-up with [facility] for some cardiac issues.  The patient has been through multiple endoscopic procedures without being able to reach the site of bleeding therefore if there should be any further GI bleeding a CT angiography with possible interventional radiology embolization of the lesions if found would be recommended.  Repeating the procedure as the patient has already had multiple times does not appear to be beneficial at this time  Thank you for involving me in the care of this patient.      LOS: 0 days   Rogelia Copping, MD, MD. NOLIA 10/22/2024, 1:55 PM,  Pager [phone] 7am-5pm  Check AMION for 5pm -7am coverage and on weekends   Note: This dictation was prepared with Dragon dictation along with smaller phrase technology. Any transcriptional errors that result from this process are unintentional.

## 2024-10-22 NOTE — H&P (Addendum)
 [facility] History and Physical  [name] FMW:[id number] DOB: 1961/04/19 DOA: 10/22/2024  Referring physician: ED  PCP: Osa Geralds, NP   Patient is coming from: Home  Chief Complaint: Chest pain and shortness of breath  HPI:  Patient is a 63 years old male with past medical history of congestive heart failure, hypertension, coronary artery disease, previous history of CVA and dissection of infrarenal aorta presented to the hospital with chest discomfort shortness of breath for last few days.  Patient also had some abdominal discomfort and was weak dizzy and lightheaded so caretaker was concerned.  Patient also reported some dark bowel movements for some time now..  Of note patient was recently admitted to the hospital and discharged on 10/ 8 after being treated for congestive heart failure exacerbation.  Patient has a history of admission to the ICU for hemorrhagic shock secondary to GI bleed and received 5.0 units of packed RBC in September 2025 patient is known to have small bowel AVMs causing GI bleed.  Patient currently lives by himself at home and has a caretaker.  Uses walker for ambulation to the bathroom.  He does not quite remember what medications he is on.  He however states that he has been having difficulty walking due to shortness of breath.  His caretaker noted that he was very pale and decided to come to the hospital.  Patient has history of CAD and stent placement in the past.  Complains of leg swelling as well.  Patient denies any urinary urgency, frequency or dysuria.  Denies fever, chills or rigor.  Denies syncope or recent falls.  In the ED, patient was noted to have low blood pressure of 109/59 and was tachycardic.  Labs were notable for mild leukocytosis with WBC at 10.7 and hemoglobin was 5 from recent baseline around 8.  CMP within normal range.  Patient was started on IV Protonix , PRBCs was ordered and was considered for admission to hospital for further  evaluation and treatment  Assessment and Plan Principal Problem:   Symptomatic anemia Active Problems:   Iron  deficiency anemia due to chronic blood loss   GI bleeding   CAD (coronary artery disease)   Essential hypertension   Severe aortic stenosis  Chest discomfort dyspnea could be secondary to angina from blood loss.  Transfuse packed RBC.  Will trend EKG and troponins.  First troponin is negative.  Monitor on telemetry, continue Imdur , metoprolol .  Check 2D echocardiogram  History of COPD with chronic hypoxic respiratory failure.  Patient uses 2 L of oxygen at baseline.  Currently at baseline.  Continue inhalers from home.  GI bleed.  History of severe aortic stenosis with AVMs.  Patient has been having black stools for some time now.  Hemoglobin on presentation at 5.0.  Will receive 2 units of packed RBC today.  Add IV PPI,  ED provider has spoken with GI Dr. Edith for consultation.    History of peripheral arterial disease, history of infrarenal aortic dissection on dual antiplatelets.  Will hold off for now.  Patient had recently seen a vascular surgeon and has plans for  vascular imaging for follow-up of aorta in January.  History of congestive heart failure, history of severe aortic stenosis.   Hold Farxiga  for now, patient is on torsemide  at home.  Will hold for now.  Check 2D echocardiogram.  Patient follows up with cardiology as outpatient with Dr. Ammon.  Does have lower extremity edema.  History of coronary artery disease Hold dual  antiplatelets for now.  Hold off with Crestor  today.  Check 2D echocardiogram  History of CVA Hold dual antiplatelet, patient is on aspirin  and Plavix  at home.  CKD stage IIIa.  Likely at baseline.  Creatinine today at 1.5.  Will continue to monitor BMP.  Deconditioning debility.  Will get PT OT eval, will get PT OT evaluation.  Has home health nurse and Occupational Therapy and aide at home.  DVT Prophylaxis: SCD for now  Review of  Systems:  All systems were reviewed and were negative unless otherwise mentioned in the HPI   Past Medical History:  Diagnosis Date   COPD (chronic obstructive pulmonary disease) (HCC)    CVA (cerebral infarction)    Headache    mild, since stroke 2012   Hypertension    MI (myocardial infarction) (HCC) 05/09/2012   Reading difficulty    pt reports he reads at about a second grade level   Stroke [facility]) 2012   numbness to left hand    Wears dentures    full upper and lower   Past Surgical History:  Procedure Laterality Date   CAROTID ENDARTERECTOMY Left    2012 or 2013   CHOLECYSTECTOMY N/A 07/26/2016   Procedure: LAPAROSCOPIC CHOLECYSTECTOMY;  Surgeon: Charlie FORBES Fell, MD;  Location: [facility] ORS;  Service: General;  Laterality: N/A;   COLONOSCOPY N/A 05/08/2024   Procedure: COLONOSCOPY;  Surgeon: Jinny Carmine, MD;  Location: [facility] ENDOSCOPY;  Service: Endoscopy;  Laterality: N/A;   COLONOSCOPY WITH PROPOFOL  N/A 02/09/2022   Procedure: COLONOSCOPY WITH PROPOFOL ;  Surgeon: Jinny Carmine, MD;  Location: [facility] ENDOSCOPY;  Service: Endoscopy;  Laterality: N/A;   CORONARY ARTERY BYPASS GRAFT  05/10/2012   3 vessel   CORONARY STENT INTERVENTION N/A 07/02/2024   Procedure: CORONARY STENT INTERVENTION;  Surgeon: Katina Albright, MD;  Location: [facility] INVASIVE CV LAB;  Service: Cardiovascular;  Laterality: N/A;   CORONARY ULTRASOUND/IVUS N/A 07/02/2024   Procedure: Coronary Ultrasound/IVUS;  Surgeon: Katina Albright, MD;  Location: [facility] INVASIVE CV LAB;  Service: Cardiovascular;  Laterality: N/A;   ENTEROSCOPY N/A 05/16/2023   Procedure: ENTEROSCOPY;  Surgeon: Therisa Bi, MD;  Location: [facility] ENDOSCOPY;  Service: Gastroenterology;  Laterality: N/A;   ENTEROSCOPY N/A 06/01/2023   Procedure: ENTEROSCOPY;  Surgeon: Unk Corinn Skiff, MD;  Location: [facility] ENDOSCOPY;  Service: Gastroenterology;  Laterality: N/A;   ESOPHAGOGASTRODUODENOSCOPY  05/13/2023   Procedure: ESOPHAGOGASTRODUODENOSCOPY (EGD);  Surgeon: Jinny Carmine, MD;  Location: [facility] ENDOSCOPY;  Service: Endoscopy;;   ESOPHAGOGASTRODUODENOSCOPY (EGD) WITH PROPOFOL  N/A 10/28/2022   Procedure: ESOPHAGOGASTRODUODENOSCOPY (EGD) WITH PROPOFOL ;  Surgeon: Onita Elspeth Sharper, DO;  Location: [facility] ENDOSCOPY;  Service: Gastroenterology;  Laterality: N/A;   GIVENS CAPSULE STUDY  05/13/2023   Procedure: GIVENS CAPSULE STUDY;  Surgeon: Jinny Carmine, MD;  Location: [facility] ENDOSCOPY;  Service: Endoscopy;;   GIVENS CAPSULE STUDY  06/01/2023   Procedure: GIVENS CAPSULE STUDY;  Surgeon: Unk Corinn Skiff, MD;  Location: [facility] ENDOSCOPY;  Service: Gastroenterology;;   LEFT HEART CATH AND CORS/GRAFTS ANGIOGRAPHY N/A 07/02/2024   Procedure: LEFT HEART CATH AND CORS/GRAFTS ANGIOGRAPHY;  Surgeon: Katina Albright, MD;  Location: [facility] INVASIVE CV LAB;  Service: Cardiovascular;  Laterality: N/A;    Social History:  reports that he quit smoking about 32 years ago. His smoking use included cigarettes. His smokeless tobacco use includes snuff. He reports that he does not currently use alcohol  after a past usage of about 26.0 standard drinks of alcohol  per week. He reports that he does not use drugs.  Allergies  Allergen  Reactions   Wellbutrin  [Bupropion ]     Paranoia     Family History  Problem Relation Age of Onset   Pneumonia Mother      Prior to Admission medications   Medication Sig Start Date End Date Taking? Authorizing Provider  albuterol  (VENTOLIN  HFA) 108 (90 Base) MCG/ACT inhaler Inhale 2 puffs into the lungs every 6 (six) hours as needed for wheezing or shortness of breath. 08/19/24   Josette Ade, MD  amiodarone  (PACERONE ) 200 MG tablet Take 1 tablet (200 mg total) by mouth daily. 08/22/24   Jerelene Critchley, MD  ascorbic acid  (VITAMIN C ) 500 MG tablet Take 1 tablet (500 mg total) by mouth daily. 08/19/24 11/17/24  Josette Ade, MD  aspirin  EC 81 MG tablet Take 1 tablet (81 mg total) by mouth daily. Swallow whole. 08/22/24 02/18/25  Jerelene Critchley, MD   clopidogrel  (PLAVIX ) 75 MG tablet Take 1 tablet (75 mg total) by mouth daily with breakfast. 08/19/24   Josette Ade, MD  dapagliflozin  propanediol (FARXIGA ) 10 MG TABS tablet Take 1 tablet (10 mg total) by mouth daily. 09/22/24     dextromethorphan -guaiFENesin  (MUCINEX  DM) 30-600 MG 12hr tablet Take 1 tablet by mouth 2 (two) times daily as needed for cough. 08/19/24   Josette Ade, MD  Fe Fum-Vit C-Vit B12-FA (TRIGELS-F FORTE) CAPS capsule Take 1 capsule by mouth 2 (two) times daily. 08/22/24   Jerelene Critchley, MD  Fluticasone -Umeclidin-Vilant (TRELEGY ELLIPTA ) 200-62.5-25 MCG/ACT AEPB Inhale 1 Inhalation into the lungs daily at 12 noon. 08/22/24   Jerelene Critchley, MD  folic acid  (KP FOLIC ACID ) 1 MG tablet Take 1 mg by mouth daily. 08/01/24   [provider]  gabapentin  (NEURONTIN ) 300 MG capsule Take 1 capsule (300 mg total) by mouth 2 (two) times daily. 08/22/24   Jerelene Critchley, MD  isosorbide  mononitrate (IMDUR ) 30 MG 24 hr tablet Take 1 tablet (30 mg total) by mouth daily. 08/22/24   Jerelene Critchley, MD  lactulose  (CHRONULAC ) 10 GM/15ML solution Take 30 mLs (20 g total) by mouth daily as needed for moderate constipation or mild constipation. 08/19/24   Josette Ade, MD  melatonin 5 MG TABS Place 1 tablet (5 mg total) into feeding tube at bedtime as needed (sleep). 08/19/24   Josette Ade, MD  metoprolol  succinate (TOPROL -XL) 25 MG 24 hr tablet Take 1 tablet (25 mg total) by mouth daily. 08/22/24   Jerelene Critchley, MD  Multiple Vitamin (MULTIVITAMIN WITH MINERALS) TABS tablet Take 1 tablet by mouth daily. 08/19/24   Josette Ade, MD  nicotine  (NICODERM CQ  - DOSED IN MG/24 HR) 7 mg/24hr patch One 7mg  patch chest wall daily 08/19/24   Josette Ade, MD  pantoprazole  (PROTONIX ) 40 MG tablet Take 1 tablet (40 mg total) by mouth daily. 08/22/24 10/21/24  Jerelene Critchley, MD  polyethylene glycol powder (GLYCOLAX /MIRALAX ) 17 GM/SCOOP powder Take 17 g by mouth at bedtime. Mix as directed.  08/22/24   Jerelene Critchley, MD  rosuvastatin  (CRESTOR ) 20 MG tablet Take 1 tablet (20 mg total) by mouth daily. 09/22/24     thiamine  (VITAMIN B-1) 100 MG tablet Take 1 tablet (100 mg total) by mouth daily. 08/19/24   Josette Ade, MD  torsemide  (DEMADEX ) 20 MG tablet Take 1 tablet (20 mg total) by mouth daily. 08/22/24   Jerelene Critchley, MD  TRUE VITAMIN D3 10 MCG (400 UNIT) CAPS Take 2 capsules by mouth daily. 06/06/24   [provider]  Vitamin D , Ergocalciferol , (DRISDOL ) 1.25 MG (50000 UNIT) CAPS capsule Take 1 capsule (50,000  Units total) by mouth every 7 (seven) days. 08/19/24   Josette Ade, MD    Physical Exam:  Vitals:   10/22/24 1025 10/22/24 1030 10/22/24 1100 10/22/24 1200  BP: (!) 109/59 110/70 97/63 107/72  Pulse:  (!) 102 96 94  Resp:  16 18 15   Temp:      TempSrc:      SpO2:  100% 100% 100%  Weight:      Height:       Wt Readings from Last 3 Encounters:  10/22/24 93 kg  09/29/24 102.3 kg  09/16/24 104.8 kg   Body mass index is 25.62 kg/m.  General:  Average built, not in obvious distress HENT: Normocephalic, pallor+ Oral mucosa is moist.  Chest:  Clear breath sounds.  . No crackles or wheezes.  CVS: S1 &S2 heard. No murmur.  Regular rate and rhythm. Abdomen: Soft, nontender, nondistended.  Bowel sounds are heard. No abdominal mass palpated Extremities: No cyanosis, clubbing bilateral lower extremity leg edema.  Peripheral pulses are palpable. Psych: Alert, awake and oriented, normal mood CNS:  No cranial nerve deficits.  Generalized weakness noted. Skin: Warm and dry.  No rashes noted.  Labs on Admission:   CBC: Recent Labs  Lab 10/22/24 1031  WBC 10.7*  NEUTROABS 7.4  HGB 5.0*  HCT 17.1*  MCV 85.1  PLT 345    Basic Metabolic Panel: Recent Labs  Lab 10/22/24 1031  NA 138  K 4.0  CL 99  CO2 26  GLUCOSE 107*  BUN 33*  CREATININE 1.59*  CALCIUM  9.0    Liver Function Tests: Recent Labs  Lab 10/22/24 1031  AST 17  ALT 9   ALKPHOS 43  BILITOT 1.1  PROT 8.5*  ALBUMIN  3.7   No results for input(s): LIPASE, AMYLASE in the last 168 hours. No results for input(s): AMMONIA in the last 168 hours.  Cardiac Enzymes: No results for input(s): CKTOTAL, CKMB, CKMBINDEX, TROPONINI in the last 168 hours.  BNP (last 3 results) Recent Labs    08/12/24 1457 09/16/24 0929 09/29/24 1117  BNP 1,514.9* 1,583.6* 1,168.9*    ProBNP (last 3 results) No results for input(s): PROBNP in the last 8760 hours.  CBG: No results for input(s): GLUCAP in the last 168 hours.  Lipase     Component Value Date/Time   LIPASE 28 09/29/2024 1117     Urinalysis    Component Value Date/Time   COLORURINE YELLOW (A) 09/16/2024 1202   APPEARANCEUR CLEAR (A) 09/16/2024 1202   APPEARANCEUR Clear 09/09/2014 1708   LABSPEC 1.014 09/16/2024 1202   LABSPEC 1.011 09/09/2014 1708   PHURINE 7.0 09/16/2024 1202   GLUCOSEU 150 (A) 09/16/2024 1202   GLUCOSEU Negative 09/09/2014 1708   HGBUR NEGATIVE 09/16/2024 1202   BILIRUBINUR NEGATIVE 09/16/2024 1202   BILIRUBINUR Negative 09/09/2014 1708   KETONESUR NEGATIVE 09/16/2024 1202   PROTEINUR NEGATIVE 09/16/2024 1202   NITRITE NEGATIVE 09/16/2024 1202   LEUKOCYTESUR NEGATIVE 09/16/2024 1202   LEUKOCYTESUR Negative 09/09/2014 1708     Drugs of Abuse     Component Value Date/Time   LABOPIA POSITIVE (A) 07/12/2024 0000   LABOPIA NONE DETECTED 09/03/2011 1218   COCAINSCRNUR NONE DETECTED 07/12/2024 0000   LABBENZ NONE DETECTED 07/12/2024 0000   LABBENZ NONE DETECTED 09/03/2011 1218   AMPHETMU NONE DETECTED 07/12/2024 0000   AMPHETMU NONE DETECTED 09/03/2011 1218   THCU NONE DETECTED 07/12/2024 0000   THCU NONE DETECTED 09/03/2011 1218   LABBARB NONE DETECTED 07/12/2024 0000   LABBARB  NONE DETECTED 09/03/2011 1218      Radiological Exams on Admission: No results found.  EKG: Personally reviewed by me which shows had sinus tachycardia   Consultant:  GI  Code Status: Full code  Microbiology none  Antibiotics: None  Family Communication:  Patients' condition and plan of care including tests being ordered have been discussed with the patient and the caretaker and health power of attorney who indicate understanding and agree with the plan.   Status is: Observation  Severity of Illness: The appropriate patient status for this patient is OBSERVATION. Observation status is judged to be reasonable and necessary in order to provide the required intensity of service to ensure the patient's safety. The patient's presenting symptoms, physical exam findings, and initial radiographic and laboratory data in the context of their medical condition is felt to place them at decreased risk for further clinical deterioration. Furthermore, it is anticipated that the patient will be medically stable for discharge from the hospital within 2 midnights of admission.   Signed, Vernal Alstrom, MD [facility] 10/22/2024

## 2024-10-22 NOTE — ED Notes (Signed)
 Written consent obtained for blood transfusion.

## 2024-10-22 NOTE — ED Provider Notes (Signed)
 [facility] Provider Note    Event Date/Time   First MD Initiated Contact with Patient 10/22/24 1024     (approximate)   History   Chest Pain   HPI  Naoki Migliaccio is a 63 year old male with history of CHF, hypertension, CAD, prior CVA, prior dissection of infrarenal aorta presenting to the emergency department for evaluation of chest pain.  Patient reports he has chronic shortness of breath, but feels like it has been worse in the past few days.  Had some intermittent chest pain last night, currently reports this as a 2 out of 10.  Does report that he has been having bloody bowel movements for a while, often in color.  Reviewed discharge summary from 10/8, admitted for lower extremity swelling in setting of CHF exacerbation.  Reviewed discharge summary from 9/5.  At that time patient was seen following syncope, noted to have a bloody bowel movement.  Admitted to ICU for hemorrhagic shock secondary to a GI bleed, received 5 units of PRBCs, IV iron .  DAPT restarted prior to discharge.  GI consult notes history of multiple prior GI bleed thought to be related to small bowel AVMs.  Given chronic comorbidities, endoscopic intervention was deferred unless patient with life-threatening bleeding.     Physical Exam   Triage Vital Signs: ED Triage Vitals  Encounter Vitals Group     BP 10/22/24 1025 (!) 109/59     Girls Systolic BP Percentile --      Girls Diastolic BP Percentile --      Boys Systolic BP Percentile --      Boys Diastolic BP Percentile --      Pulse Rate 10/22/24 1024 (!) 115     Resp 10/22/24 1024 18     Temp 10/22/24 1024 98 F (36.7 C)     Temp Source 10/22/24 1024 Oral     SpO2 10/22/24 1024 100 %     Weight 10/22/24 1022 205 lb (93 kg)     Height 10/22/24 1022 6' 3 (1.905 m)     Head Circumference --      Peak Flow --      Pain Score 10/22/24 1022 8     Pain Loc --      Pain Education --      Exclude from Growth Chart --     Most  recent vital signs: Vitals:   10/22/24 1455 10/22/24 1528  BP: 114/76 108/61  Pulse: 94 98  Resp:  20  Temp: 98 F (36.7 C) 98 F (36.7 C)  SpO2: 100% 100%     General: Awake, interactive, pale CV:  Good peripheral perfusion Resp:  Unlabored respirations, lung sounds mildly coarse with rare expiratory wheeze Abd:  Nondistended, soft, nontender Neuro:  Symmetric facial movement, fluid speech  ED Results / Procedures / Treatments   Labs (all labs ordered are listed, but only abnormal results are displayed) Labs Reviewed  CBC WITH DIFFERENTIAL/PLATELET - Abnormal; Notable for the following components:      Result Value   WBC 10.7 (*)    RBC 2.01 (*)    Hemoglobin 5.0 (*)    HCT 17.1 (*)    MCH 24.9 (*)    MCHC 29.2 (*)    RDW 15.8 (*)    Monocytes Absolute 1.1 (*)    All other components within normal limits  COMPREHENSIVE METABOLIC PANEL WITH GFR - Abnormal; Notable for the following components:   Glucose, Bld 107 (*)  BUN 33 (*)    Creatinine, Ser 1.59 (*)    Total Protein 8.5 (*)    GFR, Estimated 48 (*)    All other components within normal limits  TROPONIN I (HIGH SENSITIVITY) - Abnormal; Notable for the following components:   Troponin I (High Sensitivity) 18 (*)    All other components within normal limits  HEMOGLOBIN A1C  TYPE AND SCREEN  PREPARE RBC (CROSSMATCH)  TROPONIN I (HIGH SENSITIVITY)     EKG EKG independently reviewed and interpreted by myself demonstrates:    RADIOLOGY Imaging independently reviewed and interpreted by myself demonstrates:   Formal Radiology Read:  No results found.  PROCEDURES:  Critical Care performed: Yes, see critical care procedure note(s)  Procedures   MEDICATIONS ORDERED IN ED: Medications  0.9 %  sodium chloride  infusion (Manually program via Guardrails IV Fluids) (has no administration in time range)  isosorbide  mononitrate (IMDUR ) 24 hr tablet 30 mg (has no administration in time range)  metoprolol   succinate (TOPROL -XL) 24 hr tablet 25 mg (has no administration in time range)  pantoprazole  (PROTONIX ) injection 40 mg (has no administration in time range)  folic acid  (FOLVITE ) tablet 1 mg (has no administration in time range)  thiamine  (VITAMIN B1) tablet 100 mg (has no administration in time range)  albuterol  (PROVENTIL ) (2.5 MG/3ML) 0.083% nebulizer solution 3 mL (has no administration in time range)  budesonide -glycopyrrolate -formoterol  (BREZTRI) 160-9-4.8 MCG/ACT inhaler 2 puff (has no administration in time range)  sodium chloride  flush (NS) 0.9 % injection 3 mL (has no administration in time range)  sodium chloride  flush (NS) 0.9 % injection 3 mL (has no administration in time range)  0.9 %  sodium chloride  infusion (has no administration in time range)  acetaminophen  (TYLENOL ) tablet 650 mg (has no administration in time range)    Or  acetaminophen  (TYLENOL ) suppository 650 mg (has no administration in time range)  ondansetron  (ZOFRAN ) tablet 4 mg (has no administration in time range)    Or  ondansetron  (ZOFRAN ) injection 4 mg (has no administration in time range)  hydrALAZINE  (APRESOLINE ) injection 5 mg (has no administration in time range)  gabapentin  (NEURONTIN ) capsule 300 mg (has no administration in time range)  pantoprazole  (PROTONIX ) injection 80 mg (80 mg Intravenous Given 10/22/24 1200)     IMPRESSION / MDM / ASSESSMENT AND PLAN / ED COURSE  I reviewed the triage vital signs and the nursing notes.  Differential diagnosis includes, but is not limited to, anemia, electrolyte abnormality, ACS, pneumonia, pneumothorax, consideration for dissection but patient with ongoing pain for over a day and only minimal active pain  Patient's presentation is most consistent with acute presentation with potential threat to life or bodily function.  63 year old male presenting with weakness and chest pain.  Tachycardic on presentation.  Labs with mild leukocytosis with WBC of 10.7,  significant anemia with hemoglobin of 5, downtrended from recent baseline around 8. CMP with stable renal impairment.  Type and screen sent.  Patient updated on results of workup.  He is agreeable with blood transfusion and admission.  Reports 1 bloody bowel movement today.  Ordered for Protonix  as well.  Will reach out to hospitalist team.  Clinical Course as of 10/22/24 1544  Wed Oct 22, 2024  1203 Case discussed with hospitalist team.  They will evaluate for anticipated admission but did request GI consult.  GI paged. [NR]    Clinical Course User Index [NR] Levander Slate, MD   Case discussed with Dr. Jinny, will consult.  Hospitalist  team updated.  FINAL CLINICAL IMPRESSION(S) / ED DIAGNOSES   Final diagnoses:  Symptomatic anemia  Chest pain, unspecified type  Gastrointestinal hemorrhage, unspecified gastrointestinal hemorrhage type     Rx / DC Orders   ED Discharge Orders     None        Note:  This document was prepared using Dragon voice recognition software and may include unintentional dictation errors.   Levander Slate, MD 10/22/24 [phone]

## 2024-10-22 NOTE — ED Notes (Signed)
 Called CCMD to place patient on monitor

## 2024-10-23 DIAGNOSIS — I25119 Atherosclerotic heart disease of native coronary artery with unspecified angina pectoris: Secondary | ICD-10-CM | POA: Diagnosis present

## 2024-10-23 DIAGNOSIS — Z8673 Personal history of transient ischemic attack (TIA), and cerebral infarction without residual deficits: Secondary | ICD-10-CM | POA: Diagnosis not present

## 2024-10-23 DIAGNOSIS — Z951 Presence of aortocoronary bypass graft: Secondary | ICD-10-CM | POA: Diagnosis not present

## 2024-10-23 DIAGNOSIS — K5521 Angiodysplasia of colon with hemorrhage: Secondary | ICD-10-CM | POA: Diagnosis present

## 2024-10-23 DIAGNOSIS — D72829 Elevated white blood cell count, unspecified: Secondary | ICD-10-CM | POA: Diagnosis present

## 2024-10-23 DIAGNOSIS — Z8616 Personal history of COVID-19: Secondary | ICD-10-CM | POA: Diagnosis not present

## 2024-10-23 DIAGNOSIS — D62 Acute posthemorrhagic anemia: Secondary | ICD-10-CM | POA: Diagnosis present

## 2024-10-23 DIAGNOSIS — I130 Hypertensive heart and chronic kidney disease with heart failure and stage 1 through stage 4 chronic kidney disease, or unspecified chronic kidney disease: Secondary | ICD-10-CM | POA: Diagnosis present

## 2024-10-23 DIAGNOSIS — K219 Gastro-esophageal reflux disease without esophagitis: Secondary | ICD-10-CM | POA: Diagnosis present

## 2024-10-23 DIAGNOSIS — F1020 Alcohol dependence, uncomplicated: Secondary | ICD-10-CM | POA: Diagnosis present

## 2024-10-23 DIAGNOSIS — I739 Peripheral vascular disease, unspecified: Secondary | ICD-10-CM | POA: Diagnosis present

## 2024-10-23 DIAGNOSIS — J9611 Chronic respiratory failure with hypoxia: Secondary | ICD-10-CM | POA: Diagnosis present

## 2024-10-23 DIAGNOSIS — Z7982 Long term (current) use of aspirin: Secondary | ICD-10-CM | POA: Diagnosis not present

## 2024-10-23 DIAGNOSIS — N1831 Chronic kidney disease, stage 3a: Secondary | ICD-10-CM | POA: Diagnosis present

## 2024-10-23 DIAGNOSIS — E663 Overweight: Secondary | ICD-10-CM | POA: Diagnosis present

## 2024-10-23 DIAGNOSIS — J449 Chronic obstructive pulmonary disease, unspecified: Secondary | ICD-10-CM | POA: Diagnosis present

## 2024-10-23 DIAGNOSIS — Z6829 Body mass index (BMI) 29.0-29.9, adult: Secondary | ICD-10-CM | POA: Diagnosis not present

## 2024-10-23 DIAGNOSIS — D500 Iron deficiency anemia secondary to blood loss (chronic): Secondary | ICD-10-CM | POA: Diagnosis not present

## 2024-10-23 DIAGNOSIS — I350 Nonrheumatic aortic (valve) stenosis: Secondary | ICD-10-CM | POA: Diagnosis present

## 2024-10-23 DIAGNOSIS — D649 Anemia, unspecified: Secondary | ICD-10-CM | POA: Diagnosis not present

## 2024-10-23 DIAGNOSIS — Z8674 Personal history of sudden cardiac arrest: Secondary | ICD-10-CM | POA: Diagnosis not present

## 2024-10-23 DIAGNOSIS — Z9981 Dependence on supplemental oxygen: Secondary | ICD-10-CM | POA: Diagnosis not present

## 2024-10-23 DIAGNOSIS — Z955 Presence of coronary angioplasty implant and graft: Secondary | ICD-10-CM | POA: Diagnosis not present

## 2024-10-23 DIAGNOSIS — I10 Essential (primary) hypertension: Secondary | ICD-10-CM | POA: Diagnosis not present

## 2024-10-23 DIAGNOSIS — Z7902 Long term (current) use of antithrombotics/antiplatelets: Secondary | ICD-10-CM | POA: Diagnosis not present

## 2024-10-23 DIAGNOSIS — I5032 Chronic diastolic (congestive) heart failure: Secondary | ICD-10-CM | POA: Diagnosis present

## 2024-10-23 DIAGNOSIS — D509 Iron deficiency anemia, unspecified: Secondary | ICD-10-CM | POA: Diagnosis present

## 2024-10-23 LAB — CBC
HCT: 20.4 % — ABNORMAL LOW (ref 39.0–52.0)
Hemoglobin: 6.3 g/dL — ABNORMAL LOW (ref 13.0–17.0)
MCH: 26.8 pg (ref 26.0–34.0)
MCHC: 30.9 g/dL (ref 30.0–36.0)
MCV: 86.8 fL (ref 80.0–100.0)
Platelets: 272 K/uL (ref 150–400)
RBC: 2.35 MIL/uL — ABNORMAL LOW (ref 4.22–5.81)
RDW: 15.6 % — ABNORMAL HIGH (ref 11.5–15.5)
WBC: 12.4 K/uL — ABNORMAL HIGH (ref 4.0–10.5)
nRBC: 0 % (ref 0.0–0.2)

## 2024-10-23 LAB — COMPREHENSIVE METABOLIC PANEL WITH GFR
ALT: 7 U/L (ref 0–44)
AST: 13 U/L — ABNORMAL LOW (ref 15–41)
Albumin: 3.4 g/dL — ABNORMAL LOW (ref 3.5–5.0)
Alkaline Phosphatase: 45 U/L (ref 38–126)
Anion gap: 9 (ref 5–15)
BUN: 30 mg/dL — ABNORMAL HIGH (ref 8–23)
CO2: 26 mmol/L (ref 22–32)
Calcium: 8.3 mg/dL — ABNORMAL LOW (ref 8.9–10.3)
Chloride: 101 mmol/L (ref 98–111)
Creatinine, Ser: 1.51 mg/dL — ABNORMAL HIGH (ref 0.61–1.24)
GFR, Estimated: 51 mL/min — ABNORMAL LOW (ref >60)
Glucose, Bld: 99 mg/dL (ref 70–99)
Potassium: 3.7 mmol/L (ref 3.5–5.1)
Sodium: 136 mmol/L (ref 135–145)
Total Bilirubin: 1.7 mg/dL — ABNORMAL HIGH (ref 0.0–1.2)
Total Protein: 7.4 g/dL (ref 6.5–8.1)

## 2024-10-23 LAB — MAGNESIUM: Magnesium: 2.1 mg/dL (ref 1.7–2.4)

## 2024-10-23 LAB — PROTIME-INR
INR: 1.2 (ref 0.8–1.2)
Prothrombin Time: 15.5 s — ABNORMAL HIGH (ref 11.4–15.2)

## 2024-10-23 LAB — PREPARE RBC (CROSSMATCH)

## 2024-10-23 MED ORDER — SODIUM CHLORIDE 0.9% IV SOLUTION: Freq: Once | INTRAVENOUS | Status: AC

## 2024-10-23 NOTE — Progress Notes (Signed)
 PT Cancellation Note  Patient Details Name: Quintyn Dombek MRN: [id number] DOB: 1961-11-02   Cancelled Treatment:    Reason Eval/Treat Not Completed: Medical issues which prohibited therapy Orders received, chart reviewed. Patient with hgb 6.3 at this time. RN to find out if patient will be receiving another blood transfusion. Will hold until medically appropriate.   Maryanne Finder, PT, DPT Physical Therapist - [place]  [facility]    Fraida Veldman A Katherine Syme 10/23/2024, 10:13 AM

## 2024-10-23 NOTE — Plan of Care (Signed)

## 2024-10-23 NOTE — Evaluation (Signed)
 Occupational Therapy Evaluation Patient Details Name: [name] MRN: [id number] DOB: April 06, 1961 Today's Date: 10/23/2024   History of Present Illness   Patient is a 63 years old male with past medical history of congestive heart failure, hypertension, coronary artery disease, previous history of CVA and dissection of infrarenal aorta presented to the hospital with chest discomfort shortness of breath for last few days.  Patient also had some abdominal discomfort and was weak dizzy and lightheaded so caretaker was concerned.  Patient also reported some dark bowel movements for some time now..  Of note patient was recently admitted to the hospital and discharged on 10/ 8 after being treated for congestive heart failure exacerbation.  Patient has a history of admission to the ICU for hemorrhagic shock secondary to GI bleed and received 5.0 units of packed RBC in September 2025 patient is known to have small bowel AVMs causing GI bleed.  Patient currently lives by himself at home and has a caretaker.  Uses walker for ambulation to the bathroom.  He does not quite remember what medications he is on.  He however states that he has been having difficulty walking due to shortness of breath.  His caretaker noted that he was very pale and decided to come to the hospital.  Patient has history of CAD and stent placement in the past.  Complains of leg swelling as well.  Patient denies any urinary urgency, frequency or dysuria.  Denies fever, chills or rigor.  Denies syncope or recent falls.     Clinical Impressions [name] presents with generalized weakness, limited endurance, and SOB. PTA, pt lives alone and receives help from multiple individuals each week. His landlord/neighbor brings food each day, he receives HHPT (and possibly OT -- pt is unclear about this), someone comes each week to assist him with bathing, and pt reports that 2 other people come by once or twice for month to help him -- he is unsure  who they are or what their roles are. Pt is able to manage toileting, grooming, dressing, light housekeeping w/ Mod I; he reports he stopped driving earlier this year. Pt uses 2L supplemental O2 at baseline. Today's session limited to sitting EOB and standing next to bed only: given pt's low hemoglobin, any other OOB activity deferred. Pt completes bed mobility, transfers w/o assistance. He's sitting balance and static standing balance are good. Pt denies pain this date; however reports he feels a tightness in his chest and endorses feeling SOB. O2 sats remain in upper 90s throughout session. Provided educ re: ECS, taking frequent rest breaks, w/ pt verbalizing understanding. Recommend ongoing OT while hospitalized, home-based rehab therapy post-DC.     If plan is discharge home, recommend the following:   A little help with bathing/dressing/bathroom;Assistance with cooking/housework     Functional Status Assessment   Patient has had a recent decline in their functional status and demonstrates the ability to make significant improvements in function in a reasonable and predictable amount of time.     Equipment Recommendations   None recommended by OT     Recommendations for Other Services         Precautions/Restrictions   Precautions Precautions: None Restrictions Weight Bearing Restrictions Per Provider Order: No     Mobility Bed Mobility Overal bed mobility: Modified Independent                  Transfers Overall transfer level: Needs assistance Equipment used: Rolling walker (2 wheels) Transfers: Sit to/from  Stand Sit to Stand: Supervision           General transfer comment: SUPV for safety; however, pt able to complete easily and quickly, no dizziness      Balance Overall balance assessment: Needs assistance Sitting-balance support: Bilateral upper extremity supported Sitting balance-Leahy Scale: Good     Standing balance support: Bilateral  upper extremity supported Standing balance-Leahy Scale: Good                             ADL either performed or assessed with clinical judgement   ADL Overall ADL's : Needs assistance/impaired Eating/Feeding: Modified independent   Grooming: Wash/dry hands;Wash/dry face;Sitting                   Toilet Transfer: Modified Community Education Officer Details (indicate cue type and reason): Mod I for using urinal from bed level                 Vision         Perception         Praxis         Pertinent Vitals/Pain Pain Assessment Pain Assessment: No/denies pain     Extremity/Trunk Assessment Upper Extremity Assessment Upper Extremity Assessment: Overall WFL for tasks assessed   Lower Extremity Assessment Lower Extremity Assessment: Overall WFL for tasks assessed       Communication Communication Communication: No apparent difficulties Factors Affecting Communication: Hearing impaired   Cognition Arousal: Alert Behavior During Therapy: WFL for tasks assessed/performed Cognition: No apparent impairments             OT - Cognition Comments: A&O x 4, rambling conversation                 Following commands: Impaired Following commands impaired: Follows one step commands with increased time, Only follows one step commands consistently     Cueing  General Comments          Exercises Other Exercises Other Exercises: Educ re: ECS, w/ pt verbalizing understanding   Shoulder Instructions      Home Living Family/patient expects to be discharged to:: Private residence Living Arrangements: Alone Available Help at Discharge: Friend(s);Available PRN/intermittently Type of Home: House Home Access: Level entry     Home Layout: Two level;Able to live on main level with bedroom/bathroom Alternate Level Stairs-Number of Steps: 1+1 step to access bathroom   Bathroom Shower/Tub: Tub/shower unit   Bathroom Toilet: Standard      Home Equipment: Agricultural Consultant (2 wheels);BSC/3in1;Shower seat   Additional Comments: 2-3 L at home per patient report. He reports getting home health PT + several caregivers who assist intermittently      Prior Functioning/Environment Prior Level of Function : Needs assist             Mobility Comments: ambulates w/o AD generally, no falls history, becomes easily SOB w/ ambulation ADLs Comments: can perform BADLs w/ Mod I but fatigues easily. Someone comes to assist him with bathing 1x/wk, landlord assists w/ med mgmt and meals, Pt does not drive, does not shop    OT Problem List: Decreased strength;Decreased activity tolerance;Impaired balance (sitting and/or standing);Cardiopulmonary status limiting activity   OT Treatment/Interventions: Self-care/ADL training;Therapeutic exercise;Patient/family education;Energy conservation;DME and/or AE instruction;Balance training      OT Goals(Current goals can be found in the care plan section)   Acute Rehab OT Goals Patient Stated Goal: to breathe better OT Goal Formulation: With  patient Time For Goal Achievement: 11/06/24 Potential to Achieve Goals: Good ADL Goals Pt Will Perform Grooming: with modified independence;standing (in standing, w/ rest breaks PRN) Pt Will Perform Lower Body Dressing: with modified independence;sit to/from stand;sitting/lateral leans Pt Will Transfer to Toilet: with modified independence;ambulating (w/ RW) Additional ADL Goal #1: Pt will identify 3+ energy conservation strategies and describe how he is able to use these   OT Frequency:  Min 2X/week    Co-evaluation              AM-PAC OT 6 Clicks Daily Activity     Outcome Measure Help from another person eating meals?: None Help from another person taking care of personal grooming?: A Little Help from another person toileting, which includes using toliet, bedpan, or urinal?: A Little Help from another person bathing (including washing,  rinsing, drying)?: A Little Help from another person to put on and taking off regular upper body clothing?: None Help from another person to put on and taking off regular lower body clothing?: A Little 6 Click Score: 20   End of Session Equipment Utilized During Treatment: Rolling walker (2 wheels)  Activity Tolerance: Patient tolerated treatment well;Other (comment) (Pt limited by SOB.) Patient left: in bed;with call bell/phone within reach;with bed alarm set;with nursing/sitter in room  OT Visit Diagnosis: Muscle weakness (generalized) (M62.81)                Time: 9148-9069 OT Time Calculation (min): 39 min Charges:  OT General Charges $OT Visit: 1 Visit OT Evaluation $OT Eval Moderate Complexity: 1 Mod OT Treatments $Self Care/Home Management : 8-22 mins Suzen Hock, PhD, MS, OTR/L 10/23/24, 12:56 PM

## 2024-10-23 NOTE — TOC Initial Note (Signed)
 Transition of Care [facility]) - Initial/Assessment Note    Patient Details  Name: [name] MRN: [id number] Date of Birth: [birth year]  Transition of Care [facility]) CM/SW Contact:    [name], LCSW Phone Number: 10/23/2024, 1:14 PM  Clinical Narrative:   CSW is familiar with patient from previous admissions. He is still active with [facility] for PT, OT, RN. CSW will continue to follow patient for support and facilitate return home once stable.              Expected Discharge Plan: Home w Home Health Services Barriers to Discharge: Continued Medical Work up   Patient Goals and CMS Choice            Expected Discharge Plan and Services       Living arrangements for the past 2 months: Single Family Home                           HH Arranged: RN, PT, OT [facility] Agency: [facility] Date [facility] Agency Contacted: 10/23/24   Representative spoke with at [facility] Agency: [place]   Prior Living Arrangements/Services Living arrangements for the past 2 months: Single Family Home Lives with:: Self Patient language and need for interpreter reviewed:: Yes        Need for Family Participation in Patient Care: Yes (Comment)   Current home services: DME, Home OT, Home PT, Home RN Criminal Activity/Legal Involvement Pertinent to Current Situation/Hospitalization: No - Comment as needed  Activities of Daily Living      Permission Sought/Granted                  Emotional Assessment       Orientation: : Oriented to Self, Oriented to Place, Oriented to  Time, Oriented to Situation Alcohol  / Substance Use: Not Applicable Psych Involvement: No (comment)  Admission diagnosis:  Symptomatic anemia [D64.9] Patient Active Problem List   Diagnosis Date Noted   Dissection of infrarenal aorta (HCC) 09/17/2024   Acute exacerbation of CHF (congestive heart failure) (HCC) 09/16/2024   Abnormality of abdominal aorta 09/16/2024   NSTEMI (non-ST elevated myocardial  infarction) (HCC) 08/18/2024   Obesity (BMI 30-39.9) 08/17/2024   Hemorrhagic shock (HCC) 08/13/2024   History of CAD (coronary artery disease) 08/13/2024   COVID-19 virus infection 08/13/2024   Acute on chronic hypoxic respiratory failure (HCC) 08/13/2024   GI bleed 08/12/2024   Acute respiratory distress 07/12/2024   Lobar pneumonia 07/12/2024   Metabolic acidosis, increased anion gap 07/12/2024   Myocardial injury 07/12/2024   COPD exacerbation (HCC) 07/11/2024   Elevated lactic acid level 07/11/2024   Coronary artery disease 07/02/2024   Acute on chronic diastolic CHF (congestive heart failure) (HCC) 06/25/2024   Severe aortic stenosis 06/24/2024   Depression 05/07/2024   Chronic obstructive pulmonary disease (COPD) (HCC) 05/07/2024   AVM (arteriovenous malformation) of small bowel, 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   Rectal bleeding 06/01/2023   GI bleeding 05/31/2023   CAD (coronary artery disease) 05/31/2023   Stroke (HCC) 05/31/2023   Iron  deficiency anemia 05/31/2023   Hyponatremia 05/13/2023   Alcohol  use disorder 05/13/2023   Elevated troponin 05/13/2023   Acute blood loss anemia 05/13/2023   Iron  deficiency anemia due to chronic blood loss 10/30/2022   Obesity hypoventilation syndrome (HCC) 10/29/2022   COPD with acute exacerbation (HCC) 10/28/2022   Melena 10/26/2022   Symptomatic anemia 10/25/2022   Acute diastolic CHF (congestive heart failure) (HCC) 10/25/2022  Essential hypertension 10/25/2022   GERD without esophagitis 10/25/2022   Dyslipidemia 10/25/2022   Special screening for malignant neoplasms, colon    Polyp of sigmoid colon    Orthostatic hypotension 06/04/2021   CKD stage 3a, GFR 45-59 ml/min (HCC) 10/05/2020   Alcohol  abuse    Syncope and collapse 05/31/2020   AKI (acute kidney injury) 05/31/2020   Hypokalemia 05/31/2020   Memory loss or impairment 12/24/2018   Memory loss of unknown cause 10/14/2018   Sleeping difficulty 10/14/2018   Mild aortic valve  stenosis 10/08/2018   Hyperlipidemia 03/27/2018   Cognitive impairment 03/12/2018   History of stroke s/p left carotid endarterectomy 03/12/2018   Hypertension 03/01/2018   Atherosclerosis of native arteries of extremity with intermittent claudication 03/01/2018   Atherosclerotic peripheral vascular disease with intermittent claudication 01/03/2018   Benign essential HTN 12/07/2017   Bilateral carotid artery stenosis 12/07/2017   SOBOE (shortness of breath on exertion) 12/07/2017   Acute cholecystitis 07/24/2016   Coronary artery disease involving coronary bypass graft of native heart without angina pectoris    Tobacco abuse    PCP:  Osa Geralds, NP Pharmacy:   [facility] - [place], [place] - [address] [place] [address] Phone: [phone] Fax: [phone]  [facility] [address], [place] - [address] [place] [address] Phone: [phone] Fax: [phone]  [facility] [place] [facility] [address] [place] [address] Phone: [phone] Fax: [phone]     Social Drivers of Health (SDOH) Social History: SDOH Screenings   Food Insecurity: Patient Declined (10/23/2024)  Housing: Unknown (10/23/2024)  Transportation Needs: Patient Declined (10/23/2024)  Utilities: Patient Declined (10/23/2024)  Social Connections: Patient Declined (10/23/2024)  Recent Concern: Social Connections - Moderately Isolated (09/17/2024)  Tobacco Use: High Risk (10/22/2024)   SDOH Interventions:     Readmission Risk Interventions    09/17/2024   12:28 PM 08/13/2024   12:01 PM 05/14/2024    8:44 AM  Readmission Risk Prevention Plan  Transportation Screening Complete Complete   PCP or Specialist Appt within 3-5 Days   Complete  HRI or Home Care Consult   Complete  Social Work Consult for Recovery Care Planning/Counseling   Complete  Palliative Care Screening   Not Applicable   Medication Review Oceanographer) Complete Complete Complete  PCP or Specialist appointment within 3-5 days of discharge Complete    HRI or Home Care Consult Complete    SW Recovery Care/Counseling Consult Complete    Palliative Care Screening Not Applicable Not Applicable   Skilled Nursing Facility Not Applicable Complete

## 2024-10-23 NOTE — Progress Notes (Signed)
 Heart Failure Navigator Progress Note  Assessed for Heart & Vascular TOC clinic readiness.  Patient does not meet criteria due to current [facility] Card[facility] patient.   Navigator will sign off at this time.  Roxy Horseman, RN, BSN [facility] Heart Failure Navigator Secure Chat Only

## 2024-10-23 NOTE — Progress Notes (Signed)
 PROGRESS NOTE    [name]  FMW:[id number] DOB: 05/06/1961 DOA: 10/22/2024 PCP: Osa Geralds, NP  [id number]  LOS: 0 days   Brief hospital course:   Assessment & Plan: Patient is a 63 years old male with past medical history of congestive heart failure, hypertension, coronary artery disease, previous history of CVA and dissection of infrarenal aorta presented to the hospital with chest discomfort shortness of breath for last few days.  Patient also had some abdominal discomfort and was weak dizzy and lightheaded so caretaker was concerned.  Patient also reported some dark bowel movements for some time now..  Of note patient was recently admitted to the hospital and discharged on 10/ 8 after being treated for congestive heart failure exacerbation.  Patient has a history of admission to the ICU for hemorrhagic shock secondary to GI bleed and received 5.0 units of packed RBC in September 2025 patient is known to have small bowel AVMs causing GI bleed.  Patient currently lives by himself at home and has a caretaker.  Uses walker for ambulation to the bathroom.  He does not quite remember what medications he is on.  He however states that he has been having difficulty walking due to shortness of breath.  His caretaker noted that he was very pale and decided to come to the hospital.  Patient has history of CAD and stent placement in the past.     Chest discomfort dyspnea  could be secondary to angina from blood loss.  Trop in teens.     History of COPD  chronic hypoxic respiratory failure on 2L at [place] --chronic dyspnea at baseline.  No increased O2 requirement. --cont bronchodilators   Upper GI bleed.   --black stool with Hgb drop to 5 on presentation. 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  The definitive treatment is  to fix the aortic stenosis.  Short of that we can go and try and fix the AVMs in the small bowel with the balloon enteroscopy which needs to be performed at [facility] or a [facility] since we do not have the equipment here.  --GI consulted during current admission, no plan for endoscopy. --outpatient f/u with [facility] or [facility] for double-balloon enteroscopy.  Acute on chronic anemia due to GI blood loss --Hgb 5.0 on presentation.  Received 2u pRBC. --3rd unit pRBC today for Hgb 6.3 --transfuse to keep Hgb >=8  History of peripheral arterial disease, history of infrarenal aortic dissection on dual antiplatelets.   Patient had recently seen a vascular surgeon and has plans for vascular imaging for follow-up of aorta in January. --hold ASA and plavix  for now   History of congestive heart failure, history of severe aortic stenosis. Patient follows up with cardiology as outpatient with Dr. Ammon.  Does have lower extremity edema. --hold home torsemide    History of coronary artery disease --hold ASA and plavix  for now   History of CVA --hold ASA and plavix  for now   CKD stage IIIa.   Likely at baseline.  Creatinine today at 1.5.  Will continue to monitor BMP.   Deconditioning debility.  --PT/OT   DVT prophylaxis: SCD/Compression stockings Code Status: Full code  Family Communication:  Level of care: Telemetry Dispo:   The patient is from: home Anticipated d/c is to: home Anticipated d/c date is: 1-2 days  Subjective and Interval History:  Pt reported dyspnea that's chronic.   Objective: Vitals:   10/23/24 1214 10/23/24 1247 10/23/24 1459 10/23/24 1650  BP: 96/61 96/81 105/71 (!) 93/54  Pulse: 93 97 91 94  Resp: 16 20 20    Temp: 98.4 F (36.9 C) 98.8 F (37.1 C) 97.6 F (36.4 C) 98.2 F (36.8 C)  TempSrc:  Oral Oral   SpO2: 97% 98% 100% 100%  Weight:      Height:        Intake/Output Summary (Last 24 hours) at 10/23/2024 1933 Last data filed at 10/23/2024 1653 Gross per 24  hour  Intake 930.5 ml  Output 1400 ml  Net -469.5 ml   Filed Weights   10/22/24 1022 10/22/24 1455 10/23/24 0525  Weight: 93 kg 93.7 kg 91.6 kg    Examination:   Constitutional: NAD, AAOx3 HEENT: conjunctivae and lids normal, EOMI CV: No cyanosis.   RESP: normal respiratory effort, on 2L Neuro: II - XII grossly intact.   Psych: Normal mood and affect.     Data Reviewed: I have personally reviewed labs and imaging studies  Time spent: 50 minutes  Ellouise Haber, MD [facility] If 7PM-7AM, please contact night-coverage 10/23/2024, 7:33 PM

## 2024-10-24 ENCOUNTER — Inpatient Hospital Stay

## 2024-10-24 DIAGNOSIS — D649 Anemia, unspecified: Secondary | ICD-10-CM | POA: Diagnosis not present

## 2024-10-24 LAB — IRON AND TIBC
Iron: 23 ug/dL — ABNORMAL LOW (ref 45–182)
Saturation Ratios: 5 % — ABNORMAL LOW (ref 17.9–39.5)
TIBC: 435 ug/dL (ref 250–450)
UIBC: 412 ug/dL

## 2024-10-24 LAB — FOLATE: Folate: >20 ng/mL (ref >5.9)

## 2024-10-24 LAB — VITAMIN B12: Vitamin B-12: 402 pg/mL (ref 180–914)

## 2024-10-24 LAB — HEMOGLOBIN: Hemoglobin: 7 g/dL — ABNORMAL LOW (ref 13.0–17.0)

## 2024-10-24 LAB — PREPARE RBC (CROSSMATCH)

## 2024-10-24 LAB — FERRITIN: Ferritin: 25 ng/mL (ref 24–336)

## 2024-10-24 MED ORDER — FUROSEMIDE 10 MG/ML IJ SOLN
40.0000 mg | Freq: Once | INTRAMUSCULAR | Status: AC
  Administered 2024-10-24: 40 mg via INTRAVENOUS
  Filled 2024-10-24: qty 4

## 2024-10-24 MED ORDER — SODIUM CHLORIDE 0.9% IV SOLUTION: Freq: Once | INTRAVENOUS | Status: AC

## 2024-10-24 MED ORDER — TECHNETIUM TC 99M-LABELED RED BLOOD CELLS IV KIT
20.0000 | PACK | Freq: Once | INTRAVENOUS | Status: AC | PRN
  Administered 2024-10-24: 21.36 via INTRAVENOUS

## 2024-10-24 NOTE — Progress Notes (Signed)
 PROGRESS NOTE    [name]  FMW:[id number] DOB: Jul 05, 1961 DOA: 10/22/2024 PCP: Osa Geralds, NP  [id number]  LOS: 1 day   Brief hospital course:   Assessment & Plan: Patient is a 63 years old male with past medical history of congestive heart failure, hypertension, coronary artery disease, previous history of CVA and dissection of infrarenal aorta presented to the hospital with chest discomfort shortness of breath for last few days.  Patient also had some abdominal discomfort and was weak dizzy and lightheaded so caretaker was concerned.  Patient also reported some dark bowel movements for some time now..  Of note patient was recently admitted to the hospital and discharged on 10/ 8 after being treated for congestive heart failure exacerbation.  Patient has a history of admission to the ICU for hemorrhagic shock secondary to GI bleed and received 5.0 units of packed RBC in September 2025 patient is known to have small bowel AVMs causing GI bleed.  Patient currently lives by himself at home and has a caretaker.  Uses walker for ambulation to the bathroom.  He does not quite remember what medications he is on.  He however states that he has been having difficulty walking due to shortness of breath.  His caretaker noted that he was very pale and decided to come to the hospital.  Patient has history of CAD and stent placement in the past.     Chest discomfort dyspnea  could be secondary to angina from blood loss.  Trop in teens.     History of COPD  chronic hypoxic respiratory failure on 2L at [place] --chronic dyspnea at baseline.  No increased O2 requirement. --cont bronchodilators   Upper GI bleed.   --black stool with Hgb 5 on presentation. 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  The definitive treatment is to fix  the aortic stenosis.  Short of that we can go and try and fix the AVMs in the small bowel with the balloon enteroscopy which needs to be performed at [facility] or a [facility] since we do not have the equipment here.  --GI consulted during current admission, no plan for endoscopy. --tagged RBC scan today did not find bleeding source --outpatient f/u with [facility] or [facility] for double-balloon enteroscopy.  Acute on chronic anemia due to GI blood loss Hx of iron  def --Hgb 5.0 on presentation.  Received 3u pRBC. --give 4th unit pRBC today for Hgb 7 --transfuse to keep Hgb >=8 --anemia workup  History of peripheral arterial disease, history of infrarenal aortic dissection on dual antiplatelets.   Patient had recently seen a vascular surgeon and has plans for vascular imaging for follow-up of aorta in January. --hold ASA and plavix  for now   History of congestive heart failure, history of severe aortic stenosis. Patient follows up with cardiology as outpatient with Dr. Ammon.  Does have lower extremity edema. --hold home torsemide  --cont Toprol  --IV lasix  40 x1 today   History of coronary artery disease --hold ASA and plavix  for now   History of CVA --hold ASA and plavix  for now   CKD stage IIIa.   Likely at baseline.   --monitor   Deconditioning debility.  --PT/OT   DVT prophylaxis: SCD/Compression stockings Code Status: Full code  Family Communication:  Level of care: Telemetry Dispo:   The patient is from: home  Anticipated d/c is to: home Anticipated d/c date is: 1-2 days   Subjective and Interval History:  Pt continued to have black BM.  Tagged RBC scan attempted today, could not find bleeding source.  Pt reported dyspnea.   Objective: Vitals:   10/24/24 1153 10/24/24 1538 10/24/24 1558 10/24/24 1819  BP: 99/68 105/71 113/83 114/85  Pulse: 82 74 (!) 102 91  Resp:    20  Temp: 97.7 F (36.5 C) 98 F (36.7 C) 98.3 F (36.8 C) 98.3 F (36.8 C)  TempSrc:  Oral Oral    SpO2: 100% 99% 98% 100%  Weight:      Height:        Intake/Output Summary (Last 24 hours) at 10/24/2024 1915 Last data filed at 10/24/2024 1836 Gross per 24 hour  Intake 610 ml  Output 1400 ml  Net -790 ml   Filed Weights   10/22/24 1455 10/23/24 0525 10/24/24 0500  Weight: 93.7 kg 91.6 kg 95.7 kg    Examination:   Constitutional: NAD, AAOx3 HEENT: conjunctivae and lids normal, EOMI CV: No cyanosis.   RESP: normal respiratory effort Neuro: II - XII grossly intact.     Data Reviewed: I have personally reviewed labs and imaging studies  Time spent: 50 minutes  Ellouise Haber, MD [facility] If 7PM-7AM, please contact night-coverage 10/24/2024, 7:15 PM

## 2024-10-24 NOTE — Progress Notes (Signed)
 Douglas Copping, MD [facility]   [address] [place], [place] [address] Phone: [phone] Fax : [phone]   Subjective: The patient reports that he has had 1 bout of black stool since coming to the hospital.  The patient has a history of small bowel AVMs.  They have been multiple procedures attempting to reach these lesions without success.   Objective: Vital signs in last 24 hours: Vitals:   10/23/24 1958 10/24/24 0339 10/24/24 0500 10/24/24 0902  BP: 95/70 106/64  129/84  Pulse: 96 90  89  Resp: 20 16    Temp: 98.5 F (36.9 C) 98.2 F (36.8 C)  98.3 F (36.8 C)  TempSrc:      SpO2: 100% 100%  100%  Weight:   95.7 kg   Height:       Weight change: 2.722 kg  Intake/Output Summary (Last 24 hours) at 10/24/2024 1001 Last data filed at 10/24/2024 0500 Gross per 24 hour  Intake 450.5 ml  Output 1450 ml  Net -999.5 ml     Exam: General: Patient is sitting in the chair had just finished eating and tolerating p.o.'s.   Lab Results: @LABTEST2 @ Micro Results: No results found for this or any previous visit (from the past 240 hours). Studies/Results: ECHOCARDIOGRAM COMPLETE Result Date: 10/22/2024    ECHOCARDIOGRAM REPORT   Patient Name:   [name] Lack Date of Exam: 10/22/2024 Medical Rec #:  [id number]         Height:       75.0 in Accession #:    [id number]        Weight:       205.0 lb Date of Birth:  [birth year]          BSA:          2.218 m Patient Age:    63 years          BP:           104/69 mmHg Patient Gender: M                 HR:           95 bpm. Exam Location:  [facility] Procedure: 2D Echo, Cardiac Doppler and Color Doppler (Both Spectral and Color            Flow Doppler were utilized during procedure). Indications:     Chest Pain R07.9  History:         Patient has prior history of Echocardiogram examinations, most                  recent 05/10/2024. CAD; Prior CABG.  Sonographer:     Ashley McNeely-Sloane Referring Phys:  [id number] [facility] Diagnosing  Phys: Dwayne D Callwood MD IMPRESSIONS  1. Left ventricular ejection fraction, by estimation, is 40 to 45%. The left ventricle has mildly decreased function. The left ventricle demonstrates global hypokinesis. The left ventricular internal cavity size was moderately to severely dilated. Left ventricular diastolic parameters are consistent with Grade II diastolic dysfunction (pseudonormalization).  2. Right ventricular systolic function is low normal. The right ventricular size is mildly enlarged.  3. Left atrial size was mild to moderately dilated.  4. Right atrial size was mildly dilated.  5. The mitral valve is normal in structure. Trivial mitral valve regurgitation.  6. The aortic valve is calcified. Aortic valve regurgitation is trivial. Severe aortic valve stenosis. FINDINGS  Left Ventricle: Left ventricular ejection fraction, by estimation, is 40  to 45%. The left ventricle has mildly decreased function. The left ventricle demonstrates global hypokinesis. Strain was performed and the global longitudinal strain is indeterminate. The left ventricular internal cavity size was moderately to severely dilated. There is borderline left ventricular hypertrophy. Left ventricular diastolic parameters are consistent with Grade II diastolic dysfunction (pseudonormalization). Right Ventricle: The right ventricular size is mildly enlarged. No increase in right ventricular wall thickness. Right ventricular systolic function is low normal. Left Atrium: Left atrial size was mild to moderately dilated. Right Atrium: Right atrial size was mildly dilated. Pericardium: There is no evidence of pericardial effusion. Mitral Valve: The mitral valve is normal in structure. Trivial mitral valve regurgitation. MV peak gradient, 8.2 mmHg. The mean mitral valve gradient is 4.0 mmHg. Tricuspid Valve: The tricuspid valve is normal in structure. Tricuspid valve regurgitation is trivial. Aortic Valve: The aortic valve is calcified. Aortic valve  regurgitation is trivial. Severe aortic stenosis is present. Aortic valve mean gradient measures 25.5 mmHg. Aortic valve peak gradient measures 43.7 mmHg. Aortic valve area, by VTI measures 0.66 cm. Pulmonic Valve: The pulmonic valve was normal in structure. Pulmonic valve regurgitation is trivial. Aorta: The ascending aorta was not well visualized. IAS/Shunts: No atrial level shunt detected by color flow Doppler. Additional Comments: 3D was performed not requiring image post processing on an independent workstation and was indeterminate.  LEFT VENTRICLE PLAX 2D LVIDd:         6.10 cm   Diastology LVIDs:         4.90 cm   LV e' medial:    4.57 cm/s LV PW:         1.10 cm   LV E/e' medial:  32.8 LV IVS:        1.10 cm   LV e' lateral:   7.72 cm/s LVOT diam:     2.40 cm   LV E/e' lateral: 19.4 LV SV:         43 LV SV Index:   19 LVOT Area:     4.52 cm  RIGHT VENTRICLE RV S prime:     5.66 cm/s TAPSE (M-mode): 1.7 cm LEFT ATRIUM           Index        RIGHT ATRIUM           Index LA diam:      4.90 cm 2.21 cm/m   RA Area:     22.90 cm LA Vol (A4C): 73.4 ml 33.10 ml/m  RA Volume:   79.00 ml  35.63 ml/m  AORTIC VALVE                     PULMONIC VALVE AV Area (Vmax):    0.76 cm      PV Vmax:       0.86 m/s AV Area (Vmean):   0.68 cm      PV Vmean:      60.800 cm/s AV Area (VTI):     0.66 cm      PV VTI:        0.147 m AV Vmax:           330.50 cm/s   PV Peak grad:  2.9 mmHg AV Vmean:          235.000 cm/s  PV Mean grad:  2.0 mmHg AV VTI:            0.653 m AV Peak Grad:      43.7 mmHg AV Mean  Grad:      25.5 mmHg LVOT Vmax:         55.30 cm/s LVOT Vmean:        35.400 cm/s LVOT VTI:          0.095 m LVOT/AV VTI ratio: 0.15  AORTA Ao Root diam: 2.60 cm MITRAL VALVE MV Area (PHT): 6.17 cm     SHUNTS MV Area VTI:   1.54 cm     Systemic VTI:  0.10 m MV Peak grad:  8.2 mmHg     Systemic Diam: 2.40 cm MV Mean grad:  4.0 mmHg MV Vmax:       1.43 m/s MV Vmean:      98.6 cm/s MV Decel Time: 123 msec MV E velocity:  150.00 cm/s Douglas JONETTA Lovelace MD Electronically signed by Douglas JONETTA Lovelace MD Signature Date/Time: 10/22/2024/5:02:38 PM    Final    Medications: I have reviewed the patient's current medications. Scheduled Meds:  sodium chloride    Intravenous Once   budesonide -glycopyrrolate -formoterol   2 puff Inhalation BID   folic acid   1 mg Oral Daily   gabapentin   300 mg Oral BID   isosorbide  mononitrate  30 mg Oral Daily   metoprolol  succinate  25 mg Oral Daily   pantoprazole  (PROTONIX ) IV  40 mg Intravenous Q12H   sodium chloride  flush  3 mL Intravenous Q12H   thiamine   100 mg Oral Daily   Continuous Infusions: PRN Meds:.acetaminophen  **OR** acetaminophen , albuterol , hydrALAZINE , ondansetron  **OR** ondansetron  (ZOFRAN ) IV, sodium chloride  flush   Assessment: Principal Problem:   Symptomatic anemia Active Problems:   Essential hypertension   Iron  deficiency anemia due to chronic blood loss   GI bleeding   CAD (coronary artery disease)   Severe aortic stenosis    Plan: This patient has a small bowel side bleeding seen on capsule endoscopy in the past with multiple EGDs and colonoscopies not being able to reach the site.  If the patient continues to have GI bleeding then I would suggest having a red tagged cell scan with possible angiography and embolization of the lesions if they continue to bleed.  These lesions are not amenable to endoscopic therapy at this hospital and may need balloon enteroscopy at [facility] or [facility] as an outpatient.   LOS: 1 day   Douglas Copping, MD.FACG 10/24/2024, 10:01 AM Pager [phone] 7am-5pm  Check AMION for 5pm -7am coverage and on weekends

## 2024-10-24 NOTE — Evaluation (Signed)
 Physical Therapy Evaluation Patient Details Name: [name] MRN: [id number] DOB: [birth year] Today's Date: 10/24/2024  History of Present Illness  63 y/o male presented to ED on 10/22/24 for intermittent chest pain and SOB. Admitted for symptomatic anemia 2/2 GI bleed. PMH significant for CAD s/p CABG x 3 (04/2012), hx inferior STEMI s/p stent to RCA, chronic HFpEF, moderate to severe aortic stenosis, HTN, HLD, CVA, bilateral carotid artery stenosis, CKD stage 3a, recent GI bleed with melena Douglas/w AVM of small bowel (04/2024), COPD, & alcohol  use  Clinical Impression  Patient admitted with the above. PTA, patient lives alone and was ambulatory with no AD per patient. He requires 2 L O2 at baseline. Currently, patient presents with weakness, impaired balance, and decreased activity tolerance. Ambulatory with RW 62' with 2L O2, however spO2 dropping to 84% with activity and patient demonstrating 3/4 DOE. Able to recovery with seated rest break back to 98%. Patient does not feel safe to return home alone at this time. Patient will benefit from skilled PT services during acute stay to address listed deficits. Patient will benefit from ongoing therapy at discharge to maximize functional independence and safety.         If plan is discharge home, recommend the following: Douglas little help with walking and/or transfers;Douglas little help with bathing/dressing/bathroom;Assistance with cooking/housework;Assist for transportation;Help with stairs or ramp for entrance   Can travel by private vehicle   Yes    Equipment Recommendations None recommended by PT  Functional Status Assessment Patient has had Douglas recent decline in their functional status and demonstrates the ability to make significant improvements in function in Douglas reasonable and predictable amount of time.     Precautions / Restrictions Precautions Precautions: Fall Recall of Precautions/Restrictions: Intact Restrictions Weight Bearing Restrictions  Per Provider Order: No      Mobility  Bed Mobility Overal bed mobility: Modified Independent                  Transfers Overall transfer level: Needs assistance Equipment used: Rolling [name] (2 wheels) Transfers: Sit to/from Stand Sit to Stand: Contact guard assist                Ambulation/Gait Ambulation/Gait assistance: Contact guard assist Gait Distance (Feet): 70 Feet Assistive device: Rolling [name] (2 wheels) Gait Pattern/deviations: Step-through pattern, Decreased stride length Gait velocity: decreased     General Gait Details: CGA for safety. No LOB noted. 3/4 DOE with spO2 84% on 2L  Stairs            Wheelchair Mobility     Tilt Bed    Modified Rankin (Stroke Patients Only)       Balance Overall balance assessment: Needs assistance Sitting-balance support: Bilateral upper extremity supported Sitting balance-Leahy Scale: Good     Standing balance support: Bilateral upper extremity supported Standing balance-Leahy Scale: Good                               Pertinent Vitals/Pain Pain Assessment Pain Assessment: No/denies pain    Home Living Family/patient expects to be discharged to:: Private residence Living Arrangements: Alone Available Help at Discharge: Friend(s);Available PRN/intermittently Type of Home: House Home Access: Level entry     Alternate Level Stairs-Number of Steps: 1+1 step to access bathroom Home Layout: Two level;Able to live on main level with bedroom/bathroom Home Equipment: Rolling [name] (2 wheels);BSC/3in1;Shower seat Additional Comments: 2-3 L at home per patient report.  He reports getting home health PT + several caregivers who assist intermittently    Prior Function Prior Level of Function : Needs assist             Mobility Comments: ambulates w/o AD generally, no falls history, becomes easily SOB w/ ambulation ADLs Comments: can perform BADLs w/ Mod I but fatigues easily.  Someone comes to assist him with bathing 1x/wk, landlord assists w/ med mgmt and meals, Pt does not drive, does not shop     Extremity/Trunk Assessment   Upper Extremity Assessment Upper Extremity Assessment: Defer to OT evaluation    Lower Extremity Assessment Lower Extremity Assessment: Generalized weakness       Communication   Communication Communication: No apparent difficulties    Cognition Arousal: Alert Behavior During Therapy: WFL for tasks assessed/performed   PT - Cognitive impairments: No apparent impairments                         Following commands: Impaired Following commands impaired: Follows one step commands with increased time, Only follows one step commands consistently     Cueing       General Comments      Exercises     Assessment/Plan    PT Assessment Patient needs continued PT services  PT Problem List Decreased strength;Decreased activity tolerance;Decreased balance;Decreased mobility;Decreased knowledge of precautions;Decreased safety awareness;Cardiopulmonary status limiting activity       PT Treatment Interventions DME instruction;Gait training;Functional mobility training;Therapeutic activities;Therapeutic exercise;Balance training;Neuromuscular re-education;Patient/family education    PT Goals (Current goals can be found in the Care Plan section)  Acute Rehab PT Goals Patient Stated Goal: to get stronger before going home by myself PT Goal Formulation: With patient Time For Goal Achievement: 11/07/24 Potential to Achieve Goals: Fair    Frequency Min 2X/week     Co-evaluation               AM-PAC PT 6 Clicks Mobility  Outcome Measure Help needed turning from your back to your side while in Douglas flat bed without using bedrails?: Douglas Little Help needed moving from lying on your back to sitting on the side of Douglas flat bed without using bedrails?: Douglas Little Help needed moving to and from Douglas bed to Douglas chair (including Douglas  wheelchair)?: Douglas Little Help needed standing up from Douglas chair using your arms (e.g., wheelchair or bedside chair)?: Douglas Little Help needed to walk in hospital room?: Douglas Little Help needed climbing 3-5 steps with Douglas railing? : Douglas Little 6 Click Score: 18    End of Session Equipment Utilized During Treatment: Oxygen Activity Tolerance: Patient limited by fatigue Patient left: in chair;with call bell/phone within reach;with chair alarm set Nurse Communication: Mobility status PT Visit Diagnosis: Unsteadiness on feet (R26.81);Muscle weakness (generalized) (M62.81)    Time: 9158-9094 PT Time Calculation (min) (ACUTE ONLY): 24 min   Charges:   PT Evaluation $PT Eval Moderate Complexity: 1 Mod PT Treatments $Therapeutic Activity: 8-22 mins PT General Charges $$ ACUTE PT VISIT: 1 Visit         [name], PT, DPT Physical Therapist - [facility]  [facility]   [name] [name] 10/24/2024, 11:18 AM

## 2024-10-24 NOTE — Plan of Care (Signed)

## 2024-10-24 NOTE — Progress Notes (Signed)
 Permission granted by Dr. Awanda to hold off on blood transfusion d/t nuclear med test being a bigger priority and pt is stable.

## 2024-10-24 NOTE — Progress Notes (Signed)
 Mobility Specialist - Progress Note   10/24/24 1600  Mobility  Activity Pivoted/transferred from bed to chair;Stood at bedside;Respositioned in chair  Level of Assistance Contact guard assist, steadying assist  Assistive Device None (IV stand)  Range of Motion/Exercises Active  Activity Response Tolerated well  Mobility visit 1 Mobility  Mobility Specialist Start Time (ACUTE ONLY) 1619  Mobility Specialist Stop Time (ACUTE ONLY) 1625  Mobility Specialist Time Calculation (min) (ACUTE ONLY) 6 min   Pt was at the EOB upon entry with bed alarm on. Pt was transferred to the recliner with needs in reach and chair alarm on.  Clem Rodes Mobility Specialist 10/24/24, 5:01 PM

## 2024-10-25 DIAGNOSIS — D649 Anemia, unspecified: Secondary | ICD-10-CM | POA: Diagnosis not present

## 2024-10-25 LAB — TYPE AND SCREEN
ABO/RH(D): A POS
Antibody Screen: NEGATIVE
Unit division: 0
Unit division: 0
Unit division: 0
Unit division: 0

## 2024-10-25 LAB — BPAM RBC
Blood Product Expiration Date: 202512082359
Blood Product Expiration Date: 202512082359
Blood Product Expiration Date: 202512102359
Blood Product Expiration Date: 202512122359
ISSUE DATE / TIME: 202511051228
ISSUE DATE / TIME: 202511051559
ISSUE DATE / TIME: 202511061229
ISSUE DATE / TIME: 202511071530
Unit Type and Rh: 6200
Unit Type and Rh: 6200
Unit Type and Rh: 6200
Unit Type and Rh: 6200

## 2024-10-25 LAB — BASIC METABOLIC PANEL WITH GFR
Anion gap: 12 (ref 5–15)
BUN: 21 mg/dL (ref 8–23)
CO2: 23 mmol/L (ref 22–32)
Calcium: 8.1 mg/dL — ABNORMAL LOW (ref 8.9–10.3)
Chloride: 99 mmol/L (ref 98–111)
Creatinine, Ser: 1.45 mg/dL — ABNORMAL HIGH (ref 0.61–1.24)
GFR, Estimated: 53 mL/min — ABNORMAL LOW (ref >60)
Glucose, Bld: 130 mg/dL — ABNORMAL HIGH (ref 70–99)
Potassium: 3.4 mmol/L — ABNORMAL LOW (ref 3.5–5.1)
Sodium: 134 mmol/L — ABNORMAL LOW (ref 135–145)

## 2024-10-25 LAB — MRSA NEXT GEN BY PCR, NASAL: MRSA by PCR Next Gen: NOT DETECTED

## 2024-10-25 LAB — HEMOGLOBIN: Hemoglobin: 8.6 g/dL — ABNORMAL LOW (ref 13.0–17.0)

## 2024-10-25 LAB — MAGNESIUM: Magnesium: 2.1 mg/dL (ref 1.7–2.4)

## 2024-10-25 MED ORDER — POTASSIUM CHLORIDE CRYS ER 20 MEQ PO TBCR
40.0000 meq | EXTENDED_RELEASE_TABLET | Freq: Once | ORAL | Status: AC
  Administered 2024-10-25: 40 meq via ORAL
  Filled 2024-10-25: qty 2

## 2024-10-25 MED ORDER — ORAL CARE MOUTH RINSE
15.0000 mL | OROMUCOSAL | Status: DC | PRN
Start: 2024-10-25 — End: 2024-10-30

## 2024-10-25 MED ORDER — FUROSEMIDE 10 MG/ML IJ SOLN
40.0000 mg | Freq: Once | INTRAMUSCULAR | Status: AC
  Administered 2024-10-25: 40 mg via INTRAVENOUS
  Filled 2024-10-25: qty 4

## 2024-10-25 NOTE — Progress Notes (Signed)
 Inpatient Follow-up/Progress Note   Patient ID: [name] is a 63 y.o. male.  Overnight Events / Subjective Findings Tagged RBC scan negative yesterday No BM o/n Hgb 8.6 post transfusion Vital signs stable Tolerating po  Review of Systems  Constitutional:  Negative for activity change, appetite change, chills, diaphoresis, fatigue, fever and unexpected weight change.  HENT:  Negative for trouble swallowing and voice change.   Respiratory:  Negative for shortness of breath and wheezing.   Cardiovascular:  Negative for chest pain, palpitations and leg swelling.  Gastrointestinal:  Positive for blood in stool (melena). Negative for abdominal distention, abdominal pain, anal bleeding, constipation, diarrhea, nausea and vomiting.  Musculoskeletal:  Negative for arthralgias and myalgias.  Skin:  Negative for color change and pallor.  Neurological:  Negative for dizziness, syncope and weakness.  Psychiatric/Behavioral:  Negative for confusion. The patient is not nervous/anxious.   All other systems reviewed and are negative.    Medications  Current Facility-Administered Medications:    acetaminophen  (TYLENOL ) tablet 650 mg, 650 mg, Oral, Q6H PRN **OR** acetaminophen  (TYLENOL ) suppository 650 mg, 650 mg, Rectal, Q6H PRN, Pokhrel, Laxman, MD   albuterol  (PROVENTIL ) (2.5 MG/3ML) 0.083% nebulizer solution 3 mL, 3 mL, Nebulization, Q6H PRN, Pokhrel, Laxman, MD   budesonide -glycopyrrolate -formoterol  (BREZTRI) 160-9-4.8 MCG/ACT inhaler 2 puff, 2 puff, Inhalation, BID, Pokhrel, Laxman, MD, 2 puff at 10/24/24 2006   folic acid  (FOLVITE ) tablet 1 mg, 1 mg, Oral, Daily, Pokhrel, Laxman, MD, 1 mg at 10/24/24 0910   gabapentin  (NEURONTIN ) capsule 300 mg, 300 mg, Oral, BID, Pokhrel, Laxman, MD, 300 mg at 10/24/24 2126   hydrALAZINE  (APRESOLINE ) injection 5 mg, 5 mg, Intravenous, Q6H PRN, Pokhrel, Laxman, MD   metoprolol  succinate (TOPROL -XL) 24 hr tablet 25 mg, 25 mg, Oral, Daily, Pokhrel,  Laxman, MD, 25 mg at 10/24/24 0910   ondansetron  (ZOFRAN ) tablet 4 mg, 4 mg, Oral, Q6H PRN **OR** ondansetron  (ZOFRAN ) injection 4 mg, 4 mg, Intravenous, Q6H PRN, Pokhrel, Laxman, MD   pantoprazole  (PROTONIX ) injection 40 mg, 40 mg, Intravenous, Q12H, Pokhrel, Laxman, MD, 40 mg at 10/24/24 2126   sodium chloride  flush (NS) 0.9 % injection 3 mL, 3 mL, Intravenous, Q12H, Pokhrel, Laxman, MD, 3 mL at 10/24/24 2126   sodium chloride  flush (NS) 0.9 % injection 3 mL, 3 mL, Intravenous, PRN, Pokhrel, Laxman, MD   thiamine  (VITAMIN B1) tablet 100 mg, 100 mg, Oral, Daily, Pokhrel, Laxman, MD, 100 mg at 10/24/24 0910   acetaminophen  **OR** acetaminophen , albuterol , hydrALAZINE , ondansetron  **OR** ondansetron  (ZOFRAN ) IV, sodium chloride  flush   Objective    Vitals:   10/24/24 2037 10/25/24 0056 10/25/24 0458 10/25/24 0500  BP: 100/72 105/66 109/64   Pulse: 87 63 77   Resp: 20 18 20    Temp: 98.1 F (36.7 C) 98 F (36.7 C) (!) 97.4 F (36.3 C)   TempSrc:   Oral   SpO2: 99% 100% 100%   Weight:    97.1 kg  Height:         Physical Exam Vitals and nursing note reviewed.  Constitutional:      General: He is not in acute distress.    Appearance: Normal appearance. He is ill-appearing. He is not toxic-appearing or diaphoretic.  HENT:     Head: Normocephalic and atraumatic.     Nose: Nose normal.     Mouth/Throat:     Mouth: Mucous membranes are moist.     Pharynx: Oropharynx is clear.  Eyes:     General: No scleral icterus.  Extraocular Movements: Extraocular movements intact.  Cardiovascular:     Rate and Rhythm: Normal rate and regular rhythm.     Heart sounds: Murmur heard.     No friction rub. No gallop.  Pulmonary:     Effort: Pulmonary effort is normal. No respiratory distress.     Breath sounds: Normal breath sounds. No wheezing, rhonchi or rales.  Abdominal:     General: Abdomen is flat. Bowel sounds are normal. There is no distension.     Palpations: Abdomen is soft.      Tenderness: There is no abdominal tenderness. There is no guarding or rebound.  Musculoskeletal:     Cervical back: Neck supple.  Skin:    General: Skin is warm and dry.     Coloration: Skin is not jaundiced or pale.  Neurological:     General: No focal deficit present.     Mental Status: He is alert and oriented to person, place, and time. Mental status is at baseline.  Psychiatric:        Mood and Affect: Mood normal.        Behavior: Behavior normal.        Thought Content: Thought content normal.        Judgment: Judgment normal.      Laboratory Data Recent Labs  Lab 10/22/24 1031 10/23/24 0432 10/24/24 0510 10/25/24 0544  WBC 10.7* 12.4*  --   --   HGB 5.0* 6.3* 7.0* 8.6*  HCT 17.1* 20.4*  --   --   PLT 345 272  --   --   NEUTOPHILPCT 70  --   --   --   LYMPHOPCT 17  --   --   --   MONOPCT 10  --   --   --   EOSPCT 2  --   --   --    Recent Labs  Lab 10/22/24 1031 10/23/24 0432  NA 138 136  K 4.0 3.7  CL 99 101  CO2 26 26  BUN 33* 30*  CREATININE 1.59* 1.51*  CALCIUM  9.0 8.3*  PROT 8.5* 7.4  BILITOT 1.1 1.7*  ALKPHOS 43 45  ALT 9 7  AST 17 13*  GLUCOSE 107* 99   Recent Labs  Lab 10/23/24 0432  INR 1.2      Imaging Studies: NM GI Blood Loss Result Date: 10/24/2024 EXAM: NM GI Bleeding Scan. CLINICAL HISTORY: GI bleed. TECHNIQUE: Dynamic anterior images of the abdomen and pelvis were obtained during the time of radio-labeled autologous red blood cells. Images were acquired for 60 minutes. RADIOPHARMACEUTICAL: 21.36 millicurie technetium Tc 76m-labeled red blood cells (ULTRATAG) injection kit. COMPARISON: None provided. FINDINGS: STOMACH AND BOWEL: No accumulation of tagged red blood cells within the GI tract to suggest active gastrointestinal bleeding. OTHER VISCERA: Physiologic activity noted in the solid organs, blood pool, bladder, and genitalia. IMPRESSION: 1. No active gastrointestinal bleeding identified. Electronically signed by: Norleen Boxer MD  10/24/2024 04:28 PM EST RP Workstation: [id number]   DG Chest Port 1 View Result Date: 10/24/2024 EXAM: 1 VIEW(S) XRAY OF THE CHEST 10/24/2024 10:39:00 AM COMPARISON: 09/29/2024 CLINICAL HISTORY: Dyspnea FINDINGS: LUNGS AND PLEURA: Right upper lung nodule, stable. Chronic coarsened interstitial markings. Small pleural effusions, decreased compared to prior. No pulmonary edema. No pneumothorax. HEART AND MEDIASTINUM: Cardiomegaly, unchanged. Post median sternotomy and CABG. Atherosclerotic calcifications. BONES AND SOFT TISSUES: No acute osseous abnormality. IMPRESSION: 1. Small pleural effusions, decreased compared to prior. 2. Cardiomegaly, unchanged. Post median sternotomy and CABG. 3.  Right upper lung nodule, stable. 4. Chronic coarsened interstitial markings. 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  # melena  # Recurrent Symptomatic Anemia  # gerd  # obesity # copd with chronic hypoxic respiratory failure- on 2-3L at home # htn # CKD # Severe AV Stenosis- gradient 66 # HFpEF # Tobacco and etoh dependence # h/o cva on plavix   Plan:  Agree with previous notes from other providers regarding tertiary center for scopes if continued bleeding Can consider octreotide for persistent avm bleeding- this has been shown to reduce need for transfusion and rapidly developing anemia- however, may be of limited utility if plavix  is restarted  - dosing would be 100ug subcutaneous tid for 28 days - if helpful, transition to long acting release octreotide (given monthly)  - ultimate goal would be for AV replacement/repair to reduce avm risk  Monitor H&H.  Transfusion and resuscitation as per primary team Avoid frequent lab draws to prevent lab induced anemia Maintain two sites IV access Avoid nsaids Monitor for GIB.  Given recurrent bleeding-  consideration to get off antiplt as soon as possible- however, significant CAD and pci/des was in July recommending 6 months  Management of other medical comorbidities as per primary team  I personally performed the service.  GI to sign off. Available as needed. Please do not hesitate to call regarding questions or concerns.  Thank you for allowing us  to participate in this patient's care.   Elspeth Ozell Jungling, DO [facility]  Portions of the record may have been created with voice recognition software. Occasional wrong-word or 'sound-a-like' substitutions may have occurred due to the inherent limitations of voice recognition software.  Read the chart carefully and recognize, using context, where substitutions may have occurred.

## 2024-10-25 NOTE — Plan of Care (Signed)

## 2024-10-25 NOTE — TOC Progression Note (Signed)
 Transition of Care [facility]) - Progression Note    Patient Details  Name: Lavonta Tillis MRN: [id number] Date of Birth: 1961/09/26  [facility] CM/SW Contact  Victory Jackquline RAMAN, RN Phone Number: 10/25/2024, 4:41 PM  Clinical Narrative:   RNCM met with patient at the bedside. RNCM introduced role and explained that discharge planning would be discussed. PT is recommending STR. Patient declined STR. States he currently has HH/PT/OT/RN with [facility] and would like to continue with them. He also has great neighbors that help him by making sure he has food and getting him to his appointments. RNCM will continue to follow for discharge planning needs.    Expected Discharge Plan: Home w Home Health Services Barriers to Discharge: Continued Medical Work up               Expected Discharge Plan and Services       Living arrangements for the past 2 months: Single Family Home                           HH Arranged: RN, PT, OT [facility] Agency: [facility] Date [facility] Agency Contacted: 10/23/24   Representative spoke with at [facility] Agency: [place]    Social Drivers of Health (SDOH) Interventions SDOH Screenings   Food Insecurity: Patient Declined (10/23/2024)  Housing: Unknown (10/23/2024)  Transportation Needs: Patient Declined (10/23/2024)  Utilities: Patient Declined (10/23/2024)  Social Connections: Patient Declined (10/23/2024)  Recent Concern: Social Connections - Moderately Isolated (09/17/2024)  Tobacco Use: High Risk (10/22/2024)    Readmission Risk Interventions    09/17/2024   12:28 PM 08/13/2024   12:01 PM 05/14/2024    8:44 AM  Readmission Risk Prevention Plan  Transportation Screening Complete Complete   PCP or Specialist Appt within 3-5 Days   Complete  HRI or Home Care Consult   Complete  Social Work Consult for Recovery Care Planning/Counseling   Complete  Palliative Care Screening   Not Applicable  Medication Review [employer]) Complete  Complete Complete  PCP or Specialist appointment within 3-5 days of discharge Complete    HRI or Home Care Consult Complete    SW Recovery Care/Counseling Consult Complete    Palliative Care Screening Not Applicable Not Applicable   Skilled Nursing Facility Not Applicable Complete

## 2024-10-25 NOTE — Plan of Care (Signed)
  Problem: Education: Goal: Knowledge of General Education information will improve Description: Including pain rating scale, medication(s)/side effects and non-pharmacologic comfort measures Outcome: Progressing   Problem: Health Behavior/Discharge Planning: Goal: Ability to manage health-related needs will improve Outcome: Progressing   Problem: Clinical Measurements: Goal: Will remain free from infection Outcome: Progressing Goal: Respiratory complications will improve Outcome: Progressing   Problem: Nutrition: Goal: Adequate nutrition will be maintained Outcome: Progressing   Problem: Pain Managment: Goal: General experience of comfort will improve and/or be controlled Outcome: Progressing   Problem: Safety: Goal: Ability to remain free from injury will improve Outcome: Progressing   Problem: Skin Integrity: Goal: Risk for impaired skin integrity will decrease Outcome: Progressing

## 2024-10-25 NOTE — Progress Notes (Signed)
 PROGRESS NOTE    [name]  FMW:[id number] DOB: Mar 02, 1961 DOA: 10/22/2024 PCP: Osa Geralds, NP  [id number]  LOS: 2 days   Brief hospital course:   Assessment & Plan: Patient is a 63 years old male with past medical history of congestive heart failure, hypertension, coronary artery disease, previous history of CVA and dissection of infrarenal aorta presented to the hospital with chest discomfort shortness of breath for last few days.  Patient also had some abdominal discomfort and was weak dizzy and lightheaded so caretaker was concerned.  Patient also reported some dark bowel movements for some time now..  Of note patient was recently admitted to the hospital and discharged on 10/ 8 after being treated for congestive heart failure exacerbation.  Patient has a history of admission to the ICU for hemorrhagic shock secondary to GI bleed and received 5.0 units of packed RBC in September 2025 patient is known to have small bowel AVMs causing GI bleed.  Patient currently lives by himself at home and has a caretaker.  Uses walker for ambulation to the bathroom.  He does not quite remember what medications he is on.  He however states that he has been having difficulty walking due to shortness of breath.  His caretaker noted that he was very pale and decided to come to the hospital.  Patient has history of CAD and stent placement in the past.     Chest discomfort dyspnea  could be secondary to angina from blood loss.  Trop in teens.     History of COPD  chronic hypoxic respiratory failure on 2L at [place] --chronic dyspnea at baseline.  No increased O2 requirement. --cont bronchodilators   Upper GI bleed.   --black stool with Hgb 5 on presentation. 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  The definitive treatment is to fix  the aortic stenosis.  Short of that we can go and try and fix the AVMs in the small bowel with the balloon enteroscopy which needs to be performed at [facility] or a [facility] since we do not have the equipment here.  --GI consulted during current admission, no plan for endoscopy. --tagged RBC scan did not find bleeding source --will attempt to schedule balloon enteroscopy with [facility]  Acute on chronic anemia due to GI blood loss Hx of iron  def --Hgb 5.0 on presentation.  Received 4u pRBC. --anemia workup showed continued iron  def --transfuse to keep Hgb >=8 --need iron  suppl  History of peripheral arterial disease, history of infrarenal aortic dissection on dual antiplatelets.   Patient had recently seen a vascular surgeon and has plans for vascular imaging for follow-up of aorta in January. --hold ASA and plavix  for now   History of congestive heart failure Severe aortic stenosis Patient follows up with cardiology as outpatient with Dr. Ammon.  Does have lower extremity edema. --severe aortic stenosis is thought to be causing the AVM bleeds, however, cardio would not perform TAVR until GI bleed is fixed. --hold home torsemide  --cont Toprol  --IV lasix  40    History of coronary artery disease --hold ASA and plavix  for now   History of CVA --hold ASA and plavix  for now   CKD stage IIIa.   Likely at baseline.   --monitor   Deconditioning debility.  --PT/OT   DVT prophylaxis: SCD/Compression stockings Code Status: Full code  Family Communication: friend POA updated on the phone today Level of care: Med-Surg Dispo:   The patient is from: home Anticipated d/c is to: home Anticipated d/c date is: Monday   Subjective and Interval History:  Dyspnea on exertion.   Objective: Vitals:   10/25/24 0841 10/25/24 1128 10/25/24 1455 10/25/24 1705  BP: 93/60 98/65 108/67 110/76  Pulse: 84 86 85 86  Resp:  20 18 18   Temp: 97.8 F (36.6 C) 98.5 F (36.9 C) (!) 97.4 F (36.3 C) 98 F  (36.7 C)  TempSrc: Oral     SpO2: 99% 99% 100% 100%  Weight:      Height:        Intake/Output Summary (Last 24 hours) at 10/25/2024 1844 Last data filed at 10/25/2024 1315 Gross per 24 hour  Intake --  Output 2960 ml  Net -2960 ml   Filed Weights   10/23/24 0525 10/24/24 0500 10/25/24 0500  Weight: 91.6 kg 95.7 kg 97.1 kg    Examination:   Constitutional: NAD, AAOx3 HEENT: conjunctivae and lids normal, EOMI CV: No cyanosis.   RESP: normal respiratory effort at rest Neuro: II - XII grossly intact.     Data Reviewed: I have personally reviewed labs and imaging studies  Time spent: 50 minutes  Ellouise Haber, MD [facility] If 7PM-7AM, please contact night-coverage 10/25/2024, 6:44 PM

## 2024-10-26 DIAGNOSIS — D649 Anemia, unspecified: Secondary | ICD-10-CM | POA: Diagnosis not present

## 2024-10-26 LAB — BASIC METABOLIC PANEL WITH GFR
Anion gap: 8 (ref 5–15)
BUN: 20 mg/dL (ref 8–23)
CO2: 27 mmol/L (ref 22–32)
Calcium: 8.1 mg/dL — ABNORMAL LOW (ref 8.9–10.3)
Chloride: 102 mmol/L (ref 98–111)
Creatinine, Ser: 1.29 mg/dL — ABNORMAL HIGH (ref 0.61–1.24)
GFR, Estimated: >60 mL/min (ref >60)
Glucose, Bld: 130 mg/dL — ABNORMAL HIGH (ref 70–99)
Potassium: 3.8 mmol/L (ref 3.5–5.1)
Sodium: 137 mmol/L (ref 135–145)

## 2024-10-26 MED ORDER — FUROSEMIDE 10 MG/ML IJ SOLN
40.0000 mg | Freq: Once | INTRAMUSCULAR | Status: AC
  Administered 2024-10-26: 40 mg via INTRAVENOUS
  Filled 2024-10-26: qty 4

## 2024-10-26 MED ORDER — OCTREOTIDE ACETATE 100 MCG/ML IJ SOLN
100.0000 ug | Freq: Three times a day (TID) | INTRAMUSCULAR | Status: DC
  Administered 2024-10-26 – 2024-10-30 (×12): 100 ug via SUBCUTANEOUS
  Filled 2024-10-26 (×15): qty 1

## 2024-10-26 NOTE — Plan of Care (Signed)

## 2024-10-26 NOTE — Progress Notes (Signed)
 Physical Therapy Treatment Patient Details Name: [name] MRN: [id number] DOB: [birth year] Today's Date: 10/26/2024   History of Present Illness 63 y/o male presented to ED on 10/22/24 for intermittent chest pain and SOB. Admitted for symptomatic anemia 2/2 GI bleed. PMH significant for CAD s/p CABG x 3 (04/2012), hx inferior STEMI s/p stent to RCA, chronic HFpEF, moderate to severe aortic stenosis, HTN, HLD, CVA, bilateral carotid artery stenosis, CKD stage 3a, recent GI bleed with melena a/w AVM of small bowel (04/2024), COPD, & alcohol  use    PT Comments  Pt ready for session.  On 3 lpm.  Sats 99%.  Stated he was on 2 lpm at home.  Decreased to 2 lpm for session and sats remained 98-99%.  Secure chat to RN.  He is independent in bed mobility and transfers to RW.  He is able to walk 73' self selected gait distance with assist for O2 tank and cga x 1.  He is fatigued upon return to room.  Stated he only walks short distances at home and admits he did choose to walk further than was comfortable to him.  After short rest, she is able to stand x 3 at bedside for standing ex and static standing.  Extra time spent on education, energy conservation and general safety education.  When left to choose activity on his own, he does seem to work until fatigued and feeling poorly vs stopping when he starts to feel fatigued.  Education for shorter more frequent activity to tolerance vs longer fatiguing activity.  Voiced understanding.  Pt continues to feel comfortable with discharge home despite saying he will struggle some.  Does not want to consider SNF at this time.     If plan is discharge home, recommend the following: A little help with walking and/or transfers;Assistance with cooking/housework;Assist for transportation;Help with stairs or ramp for entrance;A little help with bathing/dressing/bathroom   Can travel by private vehicle        Equipment Recommendations  None recommended by PT     Recommendations for Other Services       Precautions / Restrictions Precautions Precautions: Fall Recall of Precautions/Restrictions: Intact Restrictions Weight Bearing Restrictions Per Provider Order: No     Mobility  Bed Mobility Overal bed mobility: Modified Independent               Patient Response: Cooperative  Transfers Overall transfer level: Modified independent Equipment used: Rolling walker (2 wheels) Transfers: Sit to/from Stand Sit to Stand: Supervision                Ambulation/Gait Ambulation/Gait assistance: Contact guard assist Gait Distance (Feet): 80 Feet Assistive device: Rolling walker (2 wheels) Gait Pattern/deviations: Step-through pattern, Decreased stride length Gait velocity: decreased     General Gait Details: generally steady but fatigue noted upon return to room.  poor awareness of limitations and overestimates safe gait distances   Stairs             Wheelchair Mobility     Tilt Bed Tilt Bed Patient Response: Cooperative  Modified Rankin (Stroke Patients Only)       Balance Overall balance assessment: Needs assistance Sitting-balance support: Feet supported Sitting balance-Leahy Scale: Good     Standing balance support: Bilateral upper extremity supported Standing balance-Leahy Scale: Good                              Communication Communication Communication: No  apparent difficulties  Cognition Arousal: Alert Behavior During Therapy: WFL for tasks assessed/performed   PT - Cognitive impairments: No apparent impairments                         Following commands: Intact      Cueing Cueing Techniques: Verbal cues  Exercises Other Exercises Other Exercises: static standing and standing ex with RW support during education    General Comments        Pertinent Vitals/Pain Pain Assessment Pain Assessment: No/denies pain    Home Living                           Prior Function            PT Goals (current goals can now be found in the care plan section) Progress towards PT goals: Progressing toward goals    Frequency    Min 2X/week      PT Plan      Co-evaluation              AM-PAC PT 6 Clicks Mobility   Outcome Measure  Help needed turning from your back to your side while in a flat bed without using bedrails?: None Help needed moving from lying on your back to sitting on the side of a flat bed without using bedrails?: None Help needed moving to and from a bed to a chair (including a wheelchair)?: None Help needed standing up from a chair using your arms (e.g., wheelchair or bedside chair)?: None Help needed to walk in hospital room?: A Little Help needed climbing 3-5 steps with a railing? : A Little 6 Click Score: 22    End of Session Equipment Utilized During Treatment: Oxygen;Gait belt Activity Tolerance: Patient limited by fatigue;Patient tolerated treatment well Patient left: in bed;with call bell/phone within reach;with bed alarm set Nurse Communication: Mobility status;Other (comment) PT Visit Diagnosis: Unsteadiness on feet (R26.81);Muscle weakness (generalized) (M62.81)     Time: 9067-8997 PT Time Calculation (min) (ACUTE ONLY): 30 min  Charges:    $Gait Training: 8-22 mins $Therapeutic Exercise: 8-22 mins PT General Charges $$ ACUTE PT VISIT: 1 Visit                   Lauraine Gills, PTA 10/26/24, 10:52 AM

## 2024-10-26 NOTE — Progress Notes (Signed)
 PROGRESS NOTE    [name]  FMW:[id number] DOB: 10-May-1961 DOA: 10/22/2024 PCP: Osa Geralds, NP  123A/123A-AA  LOS: 3 days   Brief hospital course:   Assessment & Plan: Patient is a 63 years old male with past medical history of congestive heart failure, hypertension, coronary artery disease, previous history of CVA and dissection of infrarenal aorta presented to the hospital with chest discomfort shortness of breath for last few days.  Patient also had some abdominal discomfort and was weak dizzy and lightheaded so caretaker was concerned.  Patient also reported some dark bowel movements for some time now..  Of note patient was recently admitted to the hospital and discharged on 10/ 8 after being treated for congestive heart failure exacerbation.  Patient has a history of admission to the ICU for hemorrhagic shock secondary to GI bleed and received 5.0 units of packed RBC in September 2025 patient is known to have small bowel AVMs causing GI bleed.  Patient currently lives by himself at home and has a caretaker.  Uses walker for ambulation to the bathroom.  He does not quite remember what medications he is on.  He however states that he has been having difficulty walking due to shortness of breath.  His caretaker noted that he was very pale and decided to come to the hospital.  Patient has history of CAD and stent placement in the past.     Chest discomfort dyspnea  could be secondary to angina from blood loss.  Trop in teens.     History of COPD  chronic hypoxic respiratory failure on 2L at [place] --chronic dyspnea at baseline.  No increased O2 requirement. --cont bronchodilators   Upper GI bleed.   --black stool with Hgb 5 on presentation. 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  The definitive treatment is to fix  the aortic stenosis.  Short of that we can go and try and fix the AVMs in the small bowel with the balloon enteroscopy which needs to be performed at [facility] or a [facility] since we do not have the equipment here.  --GI consulted during current admission, no plan for endoscopy. --tagged RBC scan did not find bleeding source --will attempt to schedule balloon enteroscopy with [facility]  Acute on chronic anemia due to GI blood loss Hx of iron  def --Hgb 5.0 on presentation.  Received 4u pRBC. --anemia workup showed continued iron  def --transfuse to keep Hgb >=8 --need iron  suppl  History of peripheral arterial disease, history of infrarenal aortic dissection on dual antiplatelets.   Patient had recently seen a vascular surgeon and has plans for vascular imaging for follow-up of aorta in January. --hold ASA and plavix  for now   History of congestive heart failure Severe aortic stenosis Patient follows up with cardiology as outpatient with Dr. Ammon.  Does have lower extremity edema. --severe aortic stenosis is thought to be causing the AVM bleeds, however, cardio would not perform TAVR until GI bleed is fixed. --hold home torsemide  --cont Toprol  --cont IV lasix  40 mg daily   History of coronary artery disease --hold ASA and plavix  for now   History of CVA --hold ASA and plavix  for now   CKD stage IIIa.   Likely at baseline.   --monitor Cr while diuresing   Deconditioning debility.  --PT/OT   DVT prophylaxis: SCD/Compression  stockings Code Status: Full code  Family Communication:  Level of care: Med-Surg Dispo:   The patient is from: home Anticipated d/c is to: home Anticipated d/c date is: Monday   Subjective and Interval History:  No new event today.   Objective: Vitals:   10/26/24 0438 10/26/24 0808 10/26/24 1045 10/26/24 1653  BP:  107/73  (!) 95/48  Pulse:  88  78  Resp:  20  18  Temp:  97.8 F (36.6 C)  98 F (36.7 C)  TempSrc:  Oral    SpO2:  100% 98% 100%   Weight: 100.1 kg     Height:        Intake/Output Summary (Last 24 hours) at 10/26/2024 1919 Last data filed at 10/26/2024 1500 Gross per 24 hour  Intake 480 ml  Output 3050 ml  Net -2570 ml   Filed Weights   10/24/24 0500 10/25/24 0500 10/26/24 0438  Weight: 95.7 kg 97.1 kg 100.1 kg    Examination:   Constitutional: NAD CV: No cyanosis.   RESP: normal respiratory effort   Data Reviewed: I have personally reviewed labs and imaging studies  Time spent: 35 minutes  Ellouise Haber, MD [facility] If 7PM-7AM, please contact night-coverage 10/26/2024, 7:19 PM

## 2024-10-26 NOTE — Plan of Care (Signed)
  Problem: Education: Goal: Knowledge of General Education information will improve Description: Including pain rating scale, medication(s)/side effects and non-pharmacologic comfort measures 10/26/2024 1606 by Merilee Izetta SAUNDERS, LPN Outcome: Progressing 10/26/2024 1606 by Merilee, Danicka Hourihan R, LPN Outcome: Progressing   Problem: Health Behavior/Discharge Planning: Goal: Ability to manage health-related needs will improve 10/26/2024 1606 by Merilee, Zanylah Hardie R, LPN Outcome: Progressing 10/26/2024 1606 by Merilee, Jazlen Ogarro R, LPN Outcome: Progressing   Problem: Clinical Measurements: Goal: Ability to maintain clinical measurements within normal limits will improve 10/26/2024 1606 by Merilee, Eriel Dunckel R, LPN Outcome: Progressing 10/26/2024 1606 by Merilee, Jaemarie Hochberg R, LPN Outcome: Progressing Goal: Will remain free from infection 10/26/2024 1606 by Merilee, Toni Hoffmeister R, LPN Outcome: Progressing 10/26/2024 1606 by Merilee, Amayra Kiedrowski R, LPN Outcome: Progressing Goal: Diagnostic test results will improve 10/26/2024 1606 by Merilee, Kimaya Whitlatch R, LPN Outcome: Progressing 10/26/2024 1606 by Merilee, Ryli Standlee R, LPN Outcome: Progressing Goal: Respiratory complications will improve 10/26/2024 1606 by Merilee, Janiqua Friscia R, LPN Outcome: Progressing 10/26/2024 1606 by Merilee, Daryl Beehler R, LPN Outcome: Progressing Goal: Cardiovascular complication will be avoided 10/26/2024 1606 by Merilee Izetta SAUNDERS, LPN Outcome: Progressing 10/26/2024 1606 by Merilee Izetta SAUNDERS, LPN Outcome: Progressing   Problem: Activity: Goal: Risk for activity intolerance will decrease 10/26/2024 1606 by Merilee Izetta SAUNDERS, LPN Outcome: Progressing 10/26/2024 1606 by Merilee, Louine Tenpenny R, LPN Outcome: Progressing   Problem: Nutrition: Goal: Adequate nutrition will be maintained Outcome: Progressing   Problem: Coping: Goal: Level of anxiety will decrease Outcome: Progressing   Problem: Elimination: Goal: Will not experience complications related to  bowel motility Outcome: Progressing Goal: Will not experience complications related to urinary retention Outcome: Progressing   Problem: Pain Managment: Goal: General experience of comfort will improve and/or be controlled Outcome: Progressing   Problem: Safety: Goal: Ability to remain free from injury will improve Outcome: Progressing   Problem: Skin Integrity: Goal: Risk for impaired skin integrity will decrease Outcome: Progressing

## 2024-10-27 ENCOUNTER — Telehealth (HOSPITAL_COMMUNITY): Payer: Self-pay

## 2024-10-27 ENCOUNTER — Other Ambulatory Visit (HOSPITAL_COMMUNITY): Payer: Self-pay

## 2024-10-27 DIAGNOSIS — D649 Anemia, unspecified: Secondary | ICD-10-CM | POA: Diagnosis not present

## 2024-10-27 LAB — BASIC METABOLIC PANEL WITH GFR
Anion gap: 15 (ref 5–15)
BUN: 16 mg/dL (ref 8–23)
CO2: 26 mmol/L (ref 22–32)
Calcium: 8.3 mg/dL — ABNORMAL LOW (ref 8.9–10.3)
Chloride: 97 mmol/L — ABNORMAL LOW (ref 98–111)
Creatinine, Ser: 1.36 mg/dL — ABNORMAL HIGH (ref 0.61–1.24)
GFR, Estimated: 58 mL/min — ABNORMAL LOW (ref >60)
Glucose, Bld: 122 mg/dL — ABNORMAL HIGH (ref 70–99)
Potassium: 4.2 mmol/L (ref 3.5–5.1)
Sodium: 138 mmol/L (ref 135–145)

## 2024-10-27 MED ORDER — GABAPENTIN 300 MG PO CAPS
300.0000 mg | ORAL_CAPSULE | Freq: Three times a day (TID) | ORAL | Status: DC
  Administered 2024-10-27 – 2024-10-30 (×9): 300 mg via ORAL
  Filled 2024-10-27 (×9): qty 1

## 2024-10-27 MED ORDER — SODIUM CHLORIDE 0.9 % IV SOLN
300.0000 mg | INTRAVENOUS | Status: AC
  Administered 2024-10-27 – 2024-10-29 (×3): 300 mg via INTRAVENOUS
  Filled 2024-10-27: qty 300
  Filled 2024-10-27: qty 15
  Filled 2024-10-27: qty 300

## 2024-10-27 MED ORDER — FUROSEMIDE 10 MG/ML IJ SOLN
40.0000 mg | Freq: Once | INTRAMUSCULAR | Status: AC
  Administered 2024-10-27: 40 mg via INTRAVENOUS
  Filled 2024-10-27: qty 4

## 2024-10-27 MED ORDER — ENSURE PLUS HIGH PROTEIN PO LIQD
237.0000 mL | Freq: Two times a day (BID) | ORAL | Status: DC
  Administered 2024-10-27 – 2024-10-30 (×7): 237 mL via ORAL

## 2024-10-27 NOTE — Telephone Encounter (Signed)
 Pharmacy Patient Advocate Encounter   Received notification from Inpatient Request that prior authorization for Octreotide Acetate 1000mcg/ml solution is required/requested.   Insurance verification completed.   The patient is insured through [facility].   Per test claim: PA required; PA submitted to above mentioned insurance via Latent Key/confirmation #/EOC [id number] Status is pending

## 2024-10-27 NOTE — TOC Initial Note (Signed)
 Transition of Care [facility]) - Initial/Assessment Note    Patient Details  Name: [name] MRN: [id number] Date of Birth: 04-07-1961  [facility] CM/SW Contact:    Dalia GORMAN Fuse, RN Phone Number: 10/27/2024, 11:34 AM  Clinical Narrative:                  Continues on bronchodilators for COPD. Last HGB 8.6 on 11/10. Plan for home with [facility] PT/OT/RN when medically stable  Expected Discharge Plan: Home w Home Health Services Barriers to Discharge: Continued Medical Work up   Patient Goals and CMS Choice            Expected Discharge Plan and Services       Living arrangements for the past 2 months: Single Family Home                           HH Arranged: RN, PT, OT HH Agency: [facility] Date [facility] Agency Contacted: 10/23/24   Representative spoke with at [facility] Agency: [place]   Prior Living Arrangements/Services Living arrangements for the past 2 months: Single Family Home Lives with:: Self Patient language and need for interpreter reviewed:: Yes        Need for Family Participation in Patient Care: Yes (Comment)   Current home services: DME, Home OT, Home PT, Home RN Criminal Activity/Legal Involvement Pertinent to Current Situation/Hospitalization: No - Comment as needed  Activities of Daily Living   ADL Screening (condition at time of admission) Independently performs ADLs?: No Does the patient have a NEW difficulty with bathing/dressing/toileting/self-feeding that is expected to last >3 days?: No Does the patient have a NEW difficulty with getting in/out of bed, walking, or climbing stairs that is expected to last >3 days?: No Does the patient have a NEW difficulty with communication that is expected to last >3 days?: No Is the patient deaf or have difficulty hearing?: No Does the patient have difficulty seeing, even when wearing glasses/contacts?: Yes Does the patient have difficulty concentrating, remembering, or  making decisions?: No  Permission Sought/Granted                  Emotional Assessment       Orientation: : Oriented to Self, Oriented to Place, Oriented to  Time, Oriented to Situation Alcohol  / Substance Use: Not Applicable Psych Involvement: No (comment)  Admission diagnosis:  Symptomatic anemia [D64.9] Patient Active Problem List   Diagnosis Date Noted   Dissection of infrarenal aorta (HCC) 09/17/2024   Acute exacerbation of CHF (congestive heart failure) (HCC) 09/16/2024   Abnormality of abdominal aorta 09/16/2024   NSTEMI (non-ST elevated myocardial infarction) (HCC) 08/18/2024   Obesity (BMI 30-39.9) 08/17/2024   Hemorrhagic shock (HCC) 08/13/2024   History of CAD (coronary artery disease) 08/13/2024   COVID-19 virus infection 08/13/2024   Acute on chronic hypoxic respiratory failure (HCC) 08/13/2024   GI bleed 08/12/2024   Acute respiratory distress 07/12/2024   Lobar pneumonia 07/12/2024   Metabolic acidosis, increased anion gap 07/12/2024   Myocardial injury 07/12/2024   COPD exacerbation (HCC) 07/11/2024   Elevated lactic acid level 07/11/2024   Coronary artery disease 07/02/2024   Acute on chronic diastolic CHF (congestive heart failure) (HCC) 06/25/2024   Severe aortic stenosis 06/24/2024   Depression 05/07/2024   Chronic obstructive pulmonary disease (COPD) (HCC) 05/07/2024   AVM (arteriovenous malformation) of small bowel, 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   Rectal bleeding 06/01/2023   GI bleeding 05/31/2023  CAD (coronary artery disease) 05/31/2023   Stroke (HCC) 05/31/2023   Iron  deficiency anemia 05/31/2023   Hyponatremia 05/13/2023   Alcohol  use disorder 05/13/2023   Elevated troponin 05/13/2023   Acute blood loss anemia 05/13/2023   Iron  deficiency anemia due to chronic blood loss 10/30/2022   Obesity hypoventilation syndrome (HCC) 10/29/2022   COPD with acute exacerbation (HCC) 10/28/2022   Melena 10/26/2022   Symptomatic anemia 10/25/2022   Acute  diastolic CHF (congestive heart failure) (HCC) 10/25/2022   Essential hypertension 10/25/2022   GERD without esophagitis 10/25/2022   Dyslipidemia 10/25/2022   Special screening for malignant neoplasms, colon    Polyp of sigmoid colon    Orthostatic hypotension 06/04/2021   CKD stage 3a, GFR 45-59 ml/min (HCC) 10/05/2020   Alcohol  abuse    Syncope and collapse 05/31/2020   AKI (acute kidney injury) 05/31/2020   Hypokalemia 05/31/2020   Memory loss or impairment 12/24/2018   Memory loss of unknown cause 10/14/2018   Sleeping difficulty 10/14/2018   Mild aortic valve stenosis 10/08/2018   Hyperlipidemia 03/27/2018   Cognitive impairment 03/12/2018   History of stroke s/p left carotid endarterectomy 03/12/2018   Hypertension 03/01/2018   Atherosclerosis of native arteries of extremity with intermittent claudication 03/01/2018   Atherosclerotic peripheral vascular disease with intermittent claudication 01/03/2018   Benign essential HTN 12/07/2017   Bilateral carotid artery stenosis 12/07/2017   SOBOE (shortness of breath on exertion) 12/07/2017   Acute cholecystitis 07/24/2016   Coronary artery disease involving coronary bypass graft of native heart without angina pectoris    Tobacco abuse    PCP:  Osa Geralds, NP Pharmacy:   [facility] - [place], [place] - [address] [place] [address] Phone: [phone] Fax: [phone]  [facility] [address], [place] - [address] [place] [address] Phone: [phone] Fax: [phone]  [facility] [address] [place] [address] Phone: [phone] Fax: [phone]     Social Drivers of Health (SDOH) Social History: SDOH Screenings   Food Insecurity: Patient Declined (10/23/2024)  Housing: Unknown (10/23/2024)  Transportation Needs: Patient Declined (10/23/2024)  Utilities: Patient Declined (10/23/2024)   Social Connections: Patient Declined (10/23/2024)  Recent Concern: Social Connections - Moderately Isolated (09/17/2024)  Tobacco Use: High Risk (10/22/2024)   SDOH Interventions:     Readmission Risk Interventions    09/17/2024   12:28 PM 08/13/2024   12:01 PM 05/14/2024    8:44 AM  Readmission Risk Prevention Plan  Transportation Screening Complete Complete   PCP or Specialist Appt within 3-5 Days   Complete  HRI or Home Care Consult   Complete  Social Work Consult for Recovery Care Planning/Counseling   Complete  Palliative Care Screening   Not Applicable  Medication Review Oceanographer) Complete Complete Complete  PCP or Specialist appointment within 3-5 days of discharge Complete    HRI or Home Care Consult Complete    SW Recovery Care/Counseling Consult Complete    Palliative Care Screening Not Applicable Not Applicable   Skilled Nursing Facility Not Applicable Complete

## 2024-10-27 NOTE — Plan of Care (Signed)

## 2024-10-27 NOTE — Telephone Encounter (Signed)
 Pharmacy Patient Advocate Encounter  Received notification from [facility] that Prior Authorization for Octreotide Acetate 1000mcg/ml solution has been APPROVED from 10/27/24 to 12/17/25. Ran test claim, Copay is $0. This test claim was processed through [facility]- copay amounts may vary at other pharmacies due to pharmacy/plan contracts, or as the patient moves through the different stages of their insurance plan.   PA #/Case ID/Reference #: [id number]

## 2024-10-27 NOTE — Care Management Important Message (Signed)
 Important Message  Patient Details  Name: [name] MRN: [id number] Date of Birth: 1961-10-22   Important Message Given:  Yes - Medicare IM     Danene Montijo W, CMA 10/27/2024, 12:16 PM

## 2024-10-27 NOTE — Progress Notes (Signed)
 PROGRESS NOTE    [name]  FMW:[id number] DOB: 1961-12-15 DOA: 10/22/2024 PCP: Osa Geralds, NP  123A/[id number]-AA  LOS: 4 days   Brief hospital course:   Assessment & Plan: Patient is a 63 years old male with past medical history of congestive heart failure, hypertension, coronary artery disease, previous history of CVA and dissection of infrarenal aorta presented to the hospital with chest discomfort shortness of breath for last few days.  Patient also had some abdominal discomfort and was weak dizzy and lightheaded so caretaker was concerned.  Patient also reported some dark bowel movements for some time now..  Of note patient was recently admitted to the hospital and discharged on 10/ 8 after being treated for congestive heart failure exacerbation.  Patient has a history of admission to the ICU for hemorrhagic shock secondary to GI bleed and received 5.0 units of packed RBC in September 2025 patient is known to have small bowel AVMs causing GI bleed.  Patient currently lives by himself at home and has a caretaker.  Uses walker for ambulation to the bathroom.  He does not quite remember what medications he is on.  He however states that he has been having difficulty walking due to shortness of breath.  His caretaker noted that he was very pale and decided to come to the hospital.  Patient has history of CAD and stent placement in the past.     Chest discomfort dyspnea  could be secondary to angina from blood loss.  Trop in teens.     History of COPD  chronic hypoxic respiratory failure on 2L at [place] --chronic dyspnea at baseline.  No increased O2 requirement. --cont bronchodilators   Upper GI bleed.   --black stool with Hgb 5 on presentation. 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  The definitive treatment is to fix  the aortic stenosis.  Short of that we can go and try and fix the AVMs in the small bowel with the balloon enteroscopy which needs to be performed at [facility] or a [facility] since we do not have the equipment here.  --GI consulted during current admission, no plan for endoscopy. --tagged RBC scan did not find bleeding source --will attempt to schedule balloon enteroscopy with [facility] --start subQ octreotide, per GI  Acute on chronic anemia due to GI blood loss Hx of iron  def --Hgb 5.0 on presentation.  Received 4u pRBC. --anemia workup showed continued iron  def --transfuse to keep Hgb >=8 --start IV iron   History of peripheral arterial disease, history of infrarenal aortic dissection on dual antiplatelets.   Patient had recently seen a vascular surgeon and has plans for vascular imaging for follow-up of aorta in January. --hold ASA and plavix  for now   History of congestive heart failure Severe aortic stenosis Patient follows up with cardiology as outpatient with Dr. Ammon.  Does have lower extremity edema. --severe aortic stenosis is thought to be causing the AVM bleeds, however, cardio would not perform TAVR until GI bleed is fixed. --hold home torsemide  --cont Toprol  --cont IV lasix  40 mg daily   History of coronary artery disease --hold ASA and plavix  for now   History of CVA --hold ASA and plavix  for now   CKD stage IIIa.   Likely at baseline.   --monitor Cr while diuresing   Deconditioning debility.  --PT/OT  Overweight, BMI 29   DVT prophylaxis: SCD/Compression stockings Code Status: Full code  Family Communication:  Level of care: Med-Surg Dispo:   The patient is from: home Anticipated d/c is to: home Anticipated d/c date is: 1-2 days   Subjective and Interval History:  Pt reported breathing improved.     Objective: Vitals:   10/26/24 2036 10/27/24 0438 10/27/24 1028 10/27/24 1622  BP: 114/72 108/66 113/71 120/70  Pulse: 78 81 87 86  Resp: 18 18 18 18    Temp: 97.9 F (36.6 C) 97.7 F (36.5 C) 97.7 F (36.5 C) 98.8 F (37.1 C)  TempSrc:   Axillary Oral  SpO2: 99% 100% 99% 100%  Weight:      Height:        Intake/Output Summary (Last 24 hours) at 10/27/2024 1849 Last data filed at 10/27/2024 1824 Gross per 24 hour  Intake 1605 ml  Output 3350 ml  Net -1745 ml   Filed Weights   10/24/24 0500 10/25/24 0500 10/26/24 0438  Weight: 95.7 kg 97.1 kg 100.1 kg    Examination:   Constitutional: NAD, AAOx3 HEENT: conjunctivae and lids normal, EOMI CV: No cyanosis.   RESP: normal respiratory effort, on 2L Neuro: II - XII grossly intact.     Data Reviewed: I have personally reviewed labs and imaging studies  Time spent: 35 minutes  Ellouise Haber, MD [facility] If 7PM-7AM, please contact night-coverage 10/27/2024, 6:49 PM

## 2024-10-27 NOTE — Progress Notes (Signed)
 Occupational Therapy Treatment Patient Details Name: [name] MRN: [id number] DOB: 1961/02/27 Today's Date: 10/27/2024   History of present illness 63 y/o male presented to ED on 10/22/24 for intermittent chest pain and SOB. Admitted for symptomatic anemia 2/2 GI bleed. PMH significant for CAD s/p CABG x 3 (04/2012), hx inferior STEMI s/p stent to RCA, chronic HFpEF, moderate to severe aortic stenosis, HTN, HLD, CVA, bilateral carotid artery stenosis, CKD stage 3a, recent GI bleed with melena a/w AVM of small bowel (04/2024), COPD, & alcohol  use   OT comments  Pt seen for OT treatment on this date. Upon arrival to room pt seated recliner, agreeable to tx. Pt requires supervision for STS from recliner with use of arm rests for lift off. Pt ambulating into BR with use of RW, CGA for safety as pt verbalizes feeling fatigued this date, no observed LOB. Pt completed toileting and sink level ADLs with CGA, pt reports needing standing rest break as deconditioned for standing tasks. VSS. Pt making good progress toward goals, will continue to follow POC. Discharge recommendation remains appropriate.        If plan is discharge home, recommend the following:  A little help with bathing/dressing/bathroom;Assistance with cooking/housework   Equipment Recommendations  None recommended by OT    Recommendations for Other Services      Precautions / Restrictions Precautions Precautions: Fall Recall of Precautions/Restrictions: Intact Restrictions Weight Bearing Restrictions Per Provider Order: No       Mobility Bed Mobility               General bed mobility comments: NT in recliner on arrival to room    Transfers Overall transfer level: Needs assistance Equipment used: Rolling walker (2 wheels) Transfers: Sit to/from Stand Sit to Stand: Supervision           General transfer comment: STS from recliner with bilateral UE support on arm rests     Balance Overall balance  assessment: Needs assistance Sitting-balance support: Feet supported Sitting balance-Leahy Scale: Good     Standing balance support: Bilateral upper extremity supported Standing balance-Leahy Scale: Fair Standing balance comment: reliant on RW in standing for tasks                           ADL either performed or assessed with clinical judgement   ADL Overall ADL's : Needs assistance/impaired     Grooming: Wash/dry hands;Wash/dry face;Standing;Cueing for safety;Contact guard assist               Lower Body Dressing: Contact guard assist;Sit to/from stand   Toilet Transfer: Contact guard assist;Ambulation;Regular Toilet;Rolling walker (2 wheels) Toilet Transfer Details (indicate cue type and reason): CGA amb into BR, standing pericare Toileting- Clothing Manipulation and Hygiene: Contact guard assist (Standing)       Functional mobility during ADLs: Contact guard assist;Rolling walker (2 wheels) General ADL Comments: Sink level ADL tasks with use of RW for UE support    Communication Communication Communication: No apparent difficulties Factors Affecting Communication: Hearing impaired   Cognition Arousal: Alert Behavior During Therapy: WFL for tasks assessed/performed Cognition: History of cognitive impairments, No family/caregiver present to determine baseline, Cognition impaired   Orientation impairments: Situation Awareness: Online awareness impaired, Intellectual awareness impaired Memory impairment (select all impairments): Short-term memory   Executive functioning impairment (select all impairments): Problem solving, Reasoning OT - Cognition Comments: A/Ox3, confused about situation  Following commands: Intact Following commands impaired: Follows one step commands with increased time, Only follows one step commands consistently      Cueing   Cueing Techniques: Verbal cues  Exercises Exercises: Other exercises Other  Exercises Other Exercises: Edu: Role of OT session, importance of continued ADL completion and activity to prevent deconditioning, incentive spiromotor           General Comments RN in room on arrival to assess IV/iron     Pertinent Vitals/ Pain       Pain Assessment Pain Assessment: No/denies pain                                                          Frequency  Min 2X/week        Progress Toward Goals  OT Goals(current goals can now be found in the care plan section)  Progress towards OT goals: Progressing toward goals  Acute Rehab OT Goals OT Goal Formulation: With patient Time For Goal Achievement: 11/06/24 Potential to Achieve Goals: Good ADL Goals Pt Will Perform Grooming: with modified independence;standing Pt Will Perform Lower Body Dressing: with modified independence;sit to/from stand;sitting/lateral leans Pt Will Transfer to Toilet: with modified independence;ambulating Additional ADL Goal #1: Pt will identify 3+ energy conservation strategies and describe how he is able to use these   AM-PAC OT 6 Clicks Daily Activity     Outcome Measure   Help from another person eating meals?: None Help from another person taking care of personal grooming?: A Little Help from another person toileting, which includes using toliet, bedpan, or urinal?: A Little Help from another person bathing (including washing, rinsing, drying)?: A Little Help from another person to put on and taking off regular upper body clothing?: None Help from another person to put on and taking off regular lower body clothing?: A Little 6 Click Score: 20    End of Session Equipment Utilized During Treatment: Gait belt;Rolling walker (2 wheels);Oxygen  OT Visit Diagnosis: Muscle weakness (generalized) (M62.81)   Activity Tolerance Patient tolerated treatment well   Patient Left in chair;with call bell/phone within reach;with chair alarm set   Nurse Communication  Mobility status        Time: 8395-8377 OT Time Calculation (min): 18 min  Charges: OT General Charges $OT Visit: 1 Visit OT Treatments $Self Care/Home Management : 8-22 mins  Larraine Colas M.S. OTR/L  10/27/24, 4:42 PM

## 2024-10-28 DIAGNOSIS — D649 Anemia, unspecified: Secondary | ICD-10-CM | POA: Diagnosis not present

## 2024-10-28 LAB — BASIC METABOLIC PANEL WITH GFR
Anion gap: 11 (ref 5–15)
BUN: 17 mg/dL (ref 8–23)
CO2: 29 mmol/L (ref 22–32)
Calcium: 8.7 mg/dL — ABNORMAL LOW (ref 8.9–10.3)
Chloride: 97 mmol/L — ABNORMAL LOW (ref 98–111)
Creatinine, Ser: 1.09 mg/dL (ref 0.61–1.24)
GFR, Estimated: >60 mL/min (ref >60)
Glucose, Bld: 131 mg/dL — ABNORMAL HIGH (ref 70–99)
Potassium: 3.9 mmol/L (ref 3.5–5.1)
Sodium: 137 mmol/L (ref 135–145)

## 2024-10-28 LAB — HEMOGLOBIN: Hemoglobin: 8.6 g/dL — ABNORMAL LOW (ref 13.0–17.0)

## 2024-10-28 MED ORDER — CLOPIDOGREL BISULFATE 75 MG PO TABS
75.0000 mg | ORAL_TABLET | Freq: Every day | ORAL | Status: DC
  Administered 2024-10-29 – 2024-10-30 (×2): 75 mg via ORAL
  Filled 2024-10-28 (×2): qty 1

## 2024-10-28 MED ORDER — ASPIRIN 81 MG PO TBEC
81.0000 mg | DELAYED_RELEASE_TABLET | Freq: Every day | ORAL | Status: DC
  Administered 2024-10-29 – 2024-10-30 (×2): 81 mg via ORAL
  Filled 2024-10-28 (×2): qty 1

## 2024-10-28 MED ORDER — SIMETHICONE 40 MG/0.6ML PO SUSP
80.0000 mg | Freq: Four times a day (QID) | ORAL | Status: DC | PRN
Start: 2024-10-28 — End: 2024-10-30
  Administered 2024-10-29 (×2): 80 mg via ORAL
  Filled 2024-10-28: qty 1.2
  Filled 2024-10-28: qty 30
  Filled 2024-10-28 (×2): qty 1.2

## 2024-10-28 MED ORDER — TORSEMIDE 20 MG PO TABS
20.0000 mg | ORAL_TABLET | Freq: Every day | ORAL | Status: DC
  Administered 2024-10-29 – 2024-10-30 (×2): 20 mg via ORAL
  Filled 2024-10-28 (×2): qty 1

## 2024-10-28 NOTE — Progress Notes (Signed)
 PROGRESS NOTE    [name]  FMW:[id number] DOB: [birth year] DOA: 10/22/2024 PCP: Osa Geralds, NP  [id number]  LOS: 5 days   Brief hospital course:   Assessment & Plan: Patient is a 63 years old male with past medical history of congestive heart failure, hypertension, coronary artery disease, previous history of CVA and dissection of infrarenal aorta presented to the hospital with chest discomfort shortness of breath for last few days.  Patient also had some abdominal discomfort and was weak dizzy and lightheaded so caretaker was concerned.  Patient also reported some dark bowel movements for some time now..  Of note patient was recently admitted to the hospital and discharged on 10/ 8 after being treated for congestive heart failure exacerbation.  Patient has a history of admission to the ICU for hemorrhagic shock secondary to GI bleed and received 5.0 units of packed RBC in September 2025 patient is known to have small bowel AVMs causing GI bleed.  Patient currently lives by himself at home and has a caretaker.  Uses walker for ambulation to the bathroom.  He does not quite remember what medications he is on.  He however states that he has been having difficulty walking due to shortness of breath.  His caretaker noted that he was very pale and decided to come to the hospital.  Patient has history of CAD and stent placement in the past.     Chest discomfort dyspnea  could be secondary to angina from blood loss.  Trop in teens.     Upper GI bleed 2/2 Small bowel AVM --black stool with Hgb 5 on presentation. 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  The definitive treatment is to fix the aortic stenosis.  Short of that we can go and try and fix the AVMs in the small bowel with the balloon enteroscopy which needs to be  performed at [facility] or a [facility] since we do not have the equipment here.  --GI consulted during current admission, no plan for endoscopy. --tagged RBC scan did not find bleeding source --started subQ octreotide on 11/9, per GI, for persistent avm bleeding- this has been shown to reduce need for transfusion and rapidly developing anemia- however, may be of limited utility if plavix  is restarted. --will discharge on subQ octreotide 100 micrograms TID for total of 28 days (checked price, copay $0). --need balloon enteroscopy at [facility] for AV repair (referral faxed to [facility])  Acute on chronic anemia due to GI blood loss Hx of iron  def --Hgb 5.0 on presentation.  Received 4u pRBC. --anemia workup showed continued iron  def --transfuse to keep Hgb >=8 --IV iron  x 3 days --need to monitor Hgb as outpatient  History of congestive heart failure Severe aortic stenosis Patient follows up with cardiology as outpatient with Dr. Ammon.  Does have lower extremity edema. --severe aortic stenosis is thought to be causing the AVM bleeds, however, cardio would not perform TAVR until GI bleed is fixed. --ordered IV lasix  40 mg daily --resume home torsemide  tomorrow --hold home Toprol  due to soft BP  Severe native 3-vessel CAD s/p stent on 07/02/24 --supposed to be on ASA and plavix  for at least 6 months, however, both held on presentation due to GI bleed. --resume ASA and plavix  tomorrow now that Hgb has been stable around 8's.  Although Hgb  is likely to drop again, so will need to have AVM repair as soon as possible.  History of peripheral arterial disease, history of infrarenal aortic dissection on dual antiplatelets.   Patient had recently seen a vascular surgeon and has plans for vascular imaging for follow-up of aorta in January.   History of CVA --resume statin after discharge.   History of COPD  chronic hypoxic respiratory failure on 2L at [place] --chronic dyspnea at baseline.  No increased O2  requirement. --cont bronchodilators  CKD stage IIIa.     Deconditioning debility.  --PT/OT  Overweight, BMI 29   DVT prophylaxis: SCD/Compression stockings Code Status: Full code  Family Communication: updated POA [name] on the phone today Level of care: Med-Surg Dispo:   The patient is from: home Anticipated d/c is to: home Anticipated d/c date is: tomorrow   Subjective and Interval History:  Pt had no new complaint today.     Objective: Vitals:   10/28/24 0416 10/28/24 0500 10/28/24 1631 10/28/24 2012  BP: 104/60  121/80 110/69  Pulse: 90  94 86  Resp: 18  18 20   Temp: (!) 97.4 F (36.3 C)  97.8 F (36.6 C) 98.3 F (36.8 C)  TempSrc:    Oral  SpO2: 99%  98% 99%  Weight:  100 kg    Height:        Intake/Output Summary (Last 24 hours) at 10/28/2024 2205 Last data filed at 10/28/2024 1900 Gross per 24 hour  Intake 783 ml  Output 2300 ml  Net -1517 ml   Filed Weights   10/25/24 0500 10/26/24 0438 10/28/24 0500  Weight: 97.1 kg 100.1 kg 100 kg    Examination:   Constitutional: NAD, AAOx3 HEENT: conjunctivae and lids normal, EOMI CV: No cyanosis.   RESP: normal respiratory effort Neuro: II - XII grossly intact.   Psych: Normal mood and affect.  Appropriate judgement and reason   Data Reviewed: I have personally reviewed labs and imaging studies  Time spent: 50 minutes  Ellouise Haber, MD [facility] If 7PM-7AM, please contact night-coverage 10/28/2024, 10:05 PM

## 2024-10-28 NOTE — Progress Notes (Signed)
 Physical Therapy Treatment Patient Details Name: [name] MRN: [id number] DOB: [birth year] Today's Date: 10/28/2024   History of Present Illness 63 y/o male presented to ED on 10/22/24 for intermittent chest pain and SOB. Admitted for symptomatic anemia 2/2 GI bleed. 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 Less fatigued this date, able to demonstrate Supervision for bed mobility and transfers. Improved tolerance for gait distance with RW and S/CGA. No dizziness in standing or LOB with support of RW. Pt remained on 2L O2 with SpO2 remaining in upper 90's throughout session. Pt continues to progress back to baseline LOF. Continue PT per POC.    If plan is discharge home, recommend the following: A little help with walking and/or transfers;Assistance with cooking/housework;Assist for transportation;Help with stairs or ramp for entrance;A little help with bathing/dressing/bathroom   Can travel by private vehicle     Yes  Equipment Recommendations  None recommended by PT    Recommendations for Other Services       Precautions / Restrictions Precautions Precautions: Fall Recall of Precautions/Restrictions: Intact Restrictions Weight Bearing Restrictions Per Provider Order: No     Mobility  Bed Mobility               General bed mobility comments: NT in recliner on arrival to room    Transfers Overall transfer level: Needs assistance Equipment used: Rolling walker (2 wheels) Transfers: Sit to/from Stand Sit to Stand: Supervision                Ambulation/Gait Ambulation/Gait assistance: Contact guard assist, Supervision Gait Distance (Feet): 100 Feet Assistive  device: Rolling walker (2 wheels) Gait Pattern/deviations: Step-through pattern, Decreased stride length Gait velocity: decreased     General Gait Details:  (Less fatigue this date, not dizziness in standing, no LOB)   Stairs             Wheelchair Mobility     Tilt Bed    Modified Rankin (Stroke Patients Only)       Balance Overall balance assessment: Needs assistance Sitting-balance support: Feet supported Sitting balance-Leahy Scale: Good     Standing balance support: Bilateral upper extremity supported, During functional activity, Reliant on assistive device for balance Standing balance-Leahy Scale: Fair Standing balance comment: reliant on RW in standing for tasks                            Communication Communication Communication: No apparent difficulties  Cognition Arousal: Alert Behavior During Therapy: WFL for tasks assessed/performed   PT - Cognitive impairments: No apparent impairments                       PT - Cognition Comments:  (Pleasant and cooperative) Following commands: Intact Following commands impaired: Follows one step commands with increased time    Cueing Cueing Techniques: Verbal cues  Exercises      General Comments General comments (skin integrity, edema, etc.):  (Pt educated on role of acute PT, PLB technique, and energy conservation techniques)      Pertinent Vitals/Pain Pain Assessment Pain Assessment: No/denies pain    Home Living  Prior Function            PT Goals (current goals can now be found in the care plan section) Acute Rehab PT Goals Patient Stated Goal: to get stronger before going home by myself Progress towards PT goals: Progressing toward goals    Frequency    Min 2X/week      PT Plan      Co-evaluation              AM-PAC PT 6 Clicks Mobility   Outcome Measure  Help needed turning from your back to your side while in a  flat bed without using bedrails?: None Help needed moving from lying on your back to sitting on the side of a flat bed without using bedrails?: None Help needed moving to and from a bed to a chair (including a wheelchair)?: None Help needed standing up from a chair using your arms (e.g., wheelchair or bedside chair)?: None Help needed to walk in hospital room?: A Little Help needed climbing 3-5 steps with a railing? : A Little 6 Click Score: 22    End of Session Equipment Utilized During Treatment: Oxygen;Gait belt Activity Tolerance: Patient tolerated treatment well Patient left: in chair;with call bell/phone within reach;with chair alarm set Nurse Communication: Mobility status PT Visit Diagnosis: Unsteadiness on feet (R26.81);Muscle weakness (generalized) (M62.81)     Time: 1310-1330 PT Time Calculation (min) (ACUTE ONLY): 20 min  Charges:    $Therapeutic Activity: 8-22 mins PT General Charges $$ ACUTE PT VISIT: 1 Visit                    Darice Bohr, PTA  [name] 10/28/2024, 1:36 PM

## 2024-10-28 NOTE — Plan of Care (Signed)
   Problem: Education: Goal: Knowledge of General Education information will improve Description: Including pain rating scale, medication(s)/side effects and non-pharmacologic comfort measures Outcome: Progressing   Problem: Activity: Goal: Risk for activity intolerance will decrease Outcome: Progressing   Problem: Nutrition: Goal: Adequate nutrition will be maintained Outcome: Progressing   Problem: Coping: Goal: Level of anxiety will decrease Outcome: Progressing

## 2024-10-29 ENCOUNTER — Other Ambulatory Visit: Payer: Self-pay

## 2024-10-29 DIAGNOSIS — D649 Anemia, unspecified: Secondary | ICD-10-CM | POA: Diagnosis not present

## 2024-10-29 MED ORDER — OCTREOTIDE ACETATE 100 MCG/ML IJ SOLN
100.0000 ug | Freq: Three times a day (TID) | INTRAMUSCULAR | 0 refills | Status: AC
  Filled 2024-10-29: qty 4, 2d supply, fill #0
  Filled 2024-10-30: qty 80, 26d supply, fill #0

## 2024-10-29 NOTE — Progress Notes (Signed)
 Occupational Therapy Treatment Patient Details Name: [name] MRN: [id number] DOB: 06/29/1961 Today's Date: 10/29/2024   History of present illness 63 y/o male presented to ED on 10/22/24 for intermittent chest pain and SOB. Admitted for symptomatic anemia 2/2 GI bleed. 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 He is agreeable to OT tx session targeting improving functional activity tolerance with encouragement. He amb in room with CGA-MIN A via HHA for tight space near sink per his request. Anticipate supervision-CGA with RW. He continues to make progress towards his goals, discharge recommendation remains appropriate.   Of note,  is SOB with limited mobility, spo2 >90% on 2 L vic [place], HR 100s bpm post mobility, improved with PLB; Educated pt on importance of continued mobility attempts       If plan is discharge home, recommend the following:  A little help with bathing/dressing/bathroom;Assistance with cooking/housework   Equipment Recommendations  None recommended by OT    Recommendations for Other Services      Precautions / Restrictions Precautions Precautions: Fall Recall of Precautions/Restrictions: Intact Restrictions Weight Bearing Restrictions Per Provider Order: No       Mobility Bed Mobility               General bed mobility comments: NT sitting on edge of bed pre/post session    Transfers Overall transfer level: Needs assistance Equipment used: 1 person hand held assist Transfers: Sit to/from Stand Sit to Stand: Supervision, Contact guard assist                 Balance Overall balance assessment: Needs assistance Sitting-balance  support: Feet supported Sitting balance-Leahy Scale: Good     Standing balance support: Single extremity supported, During functional activity, Reliant on assistive device for balance Standing balance-Leahy Scale: Fair                             ADL either performed or assessed with clinical judgement   ADL Overall ADL's : Needs assistance/impaired     Grooming: Standing;Oral care;Supervision/safety                   Toilet Transfer: Contact guard assist;Ambulation           Functional mobility during ADLs: Contact guard assist;Minimal assistance (via HHA)      Extremity/Trunk Assessment              Vision       Perception     Praxis     Communication Communication Communication: No apparent difficulties Factors Affecting Communication: Hearing impaired   Cognition Arousal: Alert Behavior During Therapy: WFL for tasks assessed/performed Cognition: History of cognitive impairments, No family/caregiver present to determine baseline, Cognition impaired   Orientation impairments: Situation Awareness: Online awareness impaired Memory impairment (select all impairments): Short-term memory Attention impairment (select first level of impairment): Selective attention Executive functioning impairment (select all impairments): Problem solving, Reasoning                   Following commands: Intact Following commands impaired: Follows one step commands with increased time  Cueing   Cueing Techniques: Verbal cues  Exercises Other Exercises Other Exercises: edu re role of OT, role of rehab, importance of continued mobility attempts    Shoulder Instructions       General Comments spo2 >90% on 2 L via [place]    Pertinent Vitals/ Pain       Pain Assessment Pain Assessment: No/denies pain  Home Living                                          Prior Functioning/Environment              Frequency  Min  2X/week        Progress Toward Goals  OT Goals(current goals can now be found in the care plan section)  Progress towards OT goals: Progressing toward goals  Acute Rehab OT Goals Time For Goal Achievement: 11/06/24  Plan      Co-evaluation                 AM-PAC OT 6 Clicks Daily Activity     Outcome Measure   Help from another person eating meals?: None Help from another person taking care of personal grooming?: None Help from another person toileting, which includes using toliet, bedpan, or urinal?: A Little Help from another person bathing (including washing, rinsing, drying)?: A Little Help from another person to put on and taking off regular upper body clothing?: None Help from another person to put on and taking off regular lower body clothing?: A Little 6 Click Score: 21    End of Session Equipment Utilized During Treatment: Oxygen  OT Visit Diagnosis: Muscle weakness (generalized) (M62.81)   Activity Tolerance Patient tolerated treatment well   Patient Left Other (comment) (sitting on edge of bed)   Nurse Communication Mobility status        Time: 8861-8849 OT Time Calculation (min): 12 min  Charges: OT General Charges $OT Visit: 1 Visit OT Treatments $Self Care/Home Management : 8-22 mins  Therisa Sheffield, OTD OTR/L  10/29/24, 12:41 PM

## 2024-10-29 NOTE — Progress Notes (Signed)
 PROGRESS NOTE    [name]  FMW:[id number] DOB: Dec 08, 1961 DOA: 10/22/2024 PCP: Osa Geralds, NP  Chief Complaint  Patient presents with   Chest Pain    Hospital Course:  [name] is a 63 year old man with CHF, hypertension, CAD, history of prior CVA and dissection of infrarenal aorta, who presented to the hospital with chest pain and shortness of breath.  Patient was recently admitted and discharged on 10/8 after being treated for CHF exacerbation.  Prior to that he has history of admission in September due to small bowel AVMs causing hemorrhagic shock requiring 5 units PRBCs. On this admission hemoglobin was found to be 5.  Gastroenterology was consulted.  Tagged RBC scan was performed but did not reveal bleeding source.  Patient was started on subcu octreotide on 11/9 with plan to continue this outpatient for an additional 28 days.  He will then follow-up with [facility] for balloon enteroscopy for AV repair.  Referral was faxed and arranged by gastroenterology. Discharge was delayed pending arrangement of octreotide therapy outpatient.  Subjective: No acute events overnight.  Patient has no acute complaints this morning.  He reports his POA lives in [place] and will be unable to come back and get him today   Objective: Vitals:   10/28/24 2012 10/29/24 0428 10/29/24 0742 10/29/24 1646  BP: 110/69 111/76 115/80 101/67  Pulse: 86 84 84 80  Resp: 20 20 18 17   Temp: 98.3 F (36.8 C) 98 F (36.7 C) (!) 97.3 F (36.3 C) 98.4 F (36.9 C)  TempSrc: Oral   Oral  SpO2: 99% 100% 99% 100%  Weight:      Height:        Intake/Output Summary (Last 24 hours) at 10/29/2024 1704 Last data filed at 10/29/2024 1458 Gross per 24 hour  Intake 723 ml  Output 2375 ml  Net -1652 ml   Filed Weights   10/25/24 0500 10/26/24 0438 10/28/24 0500  Weight: 97.1 kg 100.1 kg 100 kg    Examination: General exam: Appears calm and comfortable, NAD  Respiratory system: No work of  breathing, symmetric chest wall expansion Cardiovascular system: S1 & S2 heard, RRR.  Neuro: Alert and oriented. No focal neurological deficits. 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 The definitive treatment is to fix the aortic stenosis. Short of that we can go and try and fix the AVMs in the small bowel with the balloon enteroscopy which needs to be performed at [facility] or a [facility]  - GI consulted this admission, no plans for endoscopy at this time - Tagged RBC scan negative - Start subcu octreotide 11/9.  Plan to continue 100 mcg 3 times daily for 28 days.  Meds to bed working on arranging medication prior to DC - Needs to follow-up at [facility] for AV repair and balloon enteroscopy.  Referral has been faxed to [facility]  Dyspnea - Likely angina due to acute blood loss - Resolved  Acute on chronic anemia History  of iron  deficiency - Hemoglobin 5.0 on arrival - Status post 4 units PRBC - Anemia workup with persistent iron  deficiency - Transfuse to keep hemoglobin above 8 - IV iron  x 3 - Continue to monitor hemoglobin closely outpatient  History of CHF Severe aortic stenosis - Follows with Dr. Leatha outpatient. - Severe aortic stenosis likely playing a role in AVM bleeds however cardiology will not perform TAVR until GI bleed is fixed - With IV Lasix  during this admission - Continue with home dose furosemide  for now - Hold beta-blocker due to soft blood pressures.  Monitor closely,  resume as able  Severe native three-vessel CAD status post stenting July 2025 - Meant to be on aspirin  and Plavix  for 6 months but both of and held on presentation - Aspirin  and Plavix  resumed 11/12 as hemoglobin has been stable - Urgently needs AV and repair  History of PAD History infrarenal aortic dissection on dual antiplatelets - Follows outpatient with vascular surgery.  Has plans for repeat vascular imaging in January  History of CVA - Continue statin  History of COPD Chronic hypoxic respiratory failure on 2 L at baseline - Currently at baseline - Continue home meds  CKD stage IIIa - Avoid nephrotoxic medications - Renally dose when needed  Deconditioning - PT/OT while admitted.  Home health physical therapy Occupational Therapy at DC  Overweight BMI 29 -- Outpatient follow up for lifestyle modification and risk factor management    DVT prophylaxis: SCDs   Code Status: Full Code Disposition:  pending arrangement of home medications  Consultants:  Gastroenterology  Procedures:    Antimicrobials:  Anti-infectives (From admission, onward)    None       Data Reviewed: I have personally reviewed following labs and imaging studies CBC: Recent Labs  Lab 10/23/24 0432 10/24/24 0510 10/25/24 0544 10/28/24 1410  WBC 12.4*  --   --   --   HGB 6.3* 7.0* 8.6* 8.6*  HCT 20.4*  --   --   --   MCV 86.8  --   --   --   PLT 272  --   --   --    Basic Metabolic Panel: Recent Labs  Lab 10/23/24 0432 10/25/24 0543 10/26/24 0550 10/27/24 0527 10/28/24 0601  NA 136 134* 137 138 137  K 3.7 3.4* 3.8 4.2 3.9  CL 101 99 102 97* 97*  CO2 26 23 27 26 29   GLUCOSE 99 130* 130* 122* 131*  BUN 30* 21 20 16 17   CREATININE 1.51* 1.45* 1.29* 1.36* 1.09  CALCIUM  8.3* 8.1* 8.1* 8.3* 8.7*  MG 2.1 2.1  --   --   --    GFR: Estimated Creatinine Clearance: 82.8 mL/min (by C-G formula based on SCr of 1.09 mg/dL). Liver Function Tests: Recent Labs  Lab 10/23/24 0432   AST 13*  ALT 7  ALKPHOS 45  BILITOT 1.7*  PROT 7.4  ALBUMIN  3.4*   CBG: No results for input(s): GLUCAP in the last 168 hours.  Recent Results (from the past 240 hours)  MRSA Next Gen by PCR, Nasal     Status: None   Collection Time: 10/25/24  5:51 PM   Specimen: Nasal Mucosa; Nasal Swab  Result Value Ref Range Status   MRSA by PCR Next Gen NOT DETECTED NOT DETECTED Final    Comment: (NOTE) The GeneXpert MRSA Assay (FDA approved for NASAL specimens only), is one component of a comprehensive MRSA colonization surveillance program. It is not intended to diagnose  MRSA infection nor to guide or monitor treatment for MRSA infections. Test performance is not FDA approved in patients less than 20 years old. Performed at [facility], [address], [place], [place] [address]      Radiology Studies: No results found.  Scheduled Meds:  aspirin  EC  81 mg Oral Daily   budesonide -glycopyrrolate -formoterol   2 puff Inhalation BID   clopidogrel   75 mg Oral Q breakfast   feeding supplement  237 mL Oral BID BM   folic acid   1 mg Oral Daily   gabapentin   300 mg Oral TID   octreotide  100 mcg Subcutaneous TID   pantoprazole  (PROTONIX ) IV  40 mg Intravenous Q12H   sodium chloride  flush  3 mL Intravenous Q12H   thiamine   100 mg Oral Daily   torsemide   20 mg Oral Daily   Continuous Infusions:   LOS: 6 days  MDM: Patient is high risk for one or more organ failure.  They necessitate ongoing hospitalization for continued IV therapies and subsequent lab monitoring. Total time spent interpreting labs and vitals, reviewing the medical record, coordinating care amongst consultants and care team members, directly assessing and discussing care with the patient and/or family: 55 min   Sharae Zappulla, DO [facility]  To contact the attending physician between 7A-7P please use Epic Chat. To contact the covering physician during after hours 7P-7A, please review Amion.   10/29/2024, 5:04 PM   *This document has been created with the assistance of dictation software. Please excuse typographical errors. *

## 2024-10-29 NOTE — Plan of Care (Signed)

## 2024-10-30 ENCOUNTER — Other Ambulatory Visit: Payer: Self-pay

## 2024-10-30 ENCOUNTER — Other Ambulatory Visit (HOSPITAL_COMMUNITY): Payer: Self-pay

## 2024-10-30 DIAGNOSIS — I10 Essential (primary) hypertension: Secondary | ICD-10-CM | POA: Diagnosis not present

## 2024-10-30 DIAGNOSIS — D500 Iron deficiency anemia secondary to blood loss (chronic): Secondary | ICD-10-CM

## 2024-10-30 DIAGNOSIS — D649 Anemia, unspecified: Secondary | ICD-10-CM | POA: Diagnosis not present

## 2024-10-30 DIAGNOSIS — I350 Nonrheumatic aortic (valve) stenosis: Secondary | ICD-10-CM | POA: Diagnosis not present

## 2024-10-30 DIAGNOSIS — K921 Melena: Secondary | ICD-10-CM

## 2024-10-30 MED ORDER — PANTOPRAZOLE SODIUM 40 MG PO TBEC
40.0000 mg | DELAYED_RELEASE_TABLET | Freq: Every day | ORAL | Status: DC
  Administered 2024-10-30: 40 mg via ORAL
  Filled 2024-10-30: qty 1

## 2024-10-30 NOTE — TOC Progression Note (Signed)
 Transition of Care [facility]) - Progression Note    Patient Details  Name: [name] MRN: [id number] Date of Birth: 12-05-1961  Transition of Care [facility]) CM/SW Contact  Marinda Cooks, RN Phone Number: 10/30/2024, 8:24 AM  Clinical Narrative:    Per this CM chart review pt has HH secured with [facility]. This CM confirmed with Admission Liaison at [facility] [place]  that referrals is excepted and ready when pt  discharges . TOC will cont follow dc planning / care coordination and update as applicable.    Expected Discharge Plan: Home w Home Health Services Barriers to Discharge: Continued Medical Work up               Expected Discharge Plan and Services       Living arrangements for the past 2 months: Single Family Home Expected Discharge Date: 10/29/24                         HH Arranged: RN, PT, OT [facility] Agency: [facility] Date [facility] Agency Contacted: 10/23/24   Representative spoke with at [facility] Agency: [place]    Social Drivers of Health (SDOH) Interventions SDOH Screenings   Food Insecurity: Patient Declined (10/23/2024)  Housing: Unknown (10/23/2024)  Transportation Needs: Patient Declined (10/23/2024)  Utilities: Patient Declined (10/23/2024)  Social Connections: Patient Declined (10/23/2024)  Recent Concern: Social Connections - Moderately Isolated (09/17/2024)  Tobacco Use: High Risk (10/22/2024)    Readmission Risk Interventions    09/17/2024   12:28 PM 08/13/2024   12:01 PM 05/14/2024    8:44 AM  Readmission Risk Prevention Plan  Transportation Screening Complete Complete   PCP or Specialist Appt within 3-5 Days   Complete  HRI or Home Care Consult   Complete  Social Work Consult for Recovery Care Planning/Counseling   Complete  Palliative Care Screening   Not Applicable  Medication Review Oceanographer) Complete Complete Complete  PCP or Specialist appointment within 3-5 days of discharge Complete    HRI or Home Care Consult Complete     SW Recovery Care/Counseling Consult Complete    Palliative Care Screening Not Applicable Not Applicable   Skilled Nursing Facility Not Applicable Complete

## 2024-10-30 NOTE — Progress Notes (Signed)
 Mobility Specialist Progress Note:    10/30/24 0838  Mobility  Activity Ambulated with assistance  Level of Assistance Standby assist, set-up cues, supervision of patient - no hands on  Assistive Device Front wheel walker  Distance Ambulated (ft) 100 ft  Range of Motion/Exercises Active;All extremities  Activity Response Tolerated well  Mobility visit 1 Mobility  Mobility Specialist Start Time (ACUTE ONLY) [id number]  Mobility Specialist Stop Time (ACUTE ONLY) 0830  Mobility Specialist Time Calculation (min) (ACUTE ONLY) 23 min   Pt received sitting EOB, agreeable to mobility. Required supervision to stand and ambulate with RW. Tolerated well, audible SOB and c/o fatigue near EOS. SpO2 98% on 2L and HR 108 bpm. Returned pt sitting EOB, all belongings in reach. SOB began to subside with rest, all needs met.  Sherrilee Ditty Mobility Specialist Please contact via Special Educational Needs Teacher or  Rehab office at [phone]

## 2024-10-30 NOTE — Discharge Summary (Addendum)
 DISCHARGE SUMMARY    [name] FMW:[id number] DOB: 01/23/1961 DOA: 10/22/2024  PCP: Osa Geralds, NP  Admit date: 10/22/2024 Discharge date: 10/30/2024   Recommendations for Outpatient Follow-up:  Referral placed for [facility] for small balloon enteroscopy.  Also needs urgent follow-up with cardiology for AV repair   Hospital Course: [name] is a 62 year old man with CHF, hypertension, CAD, history of prior CVA and dissection of infrarenal aorta, who presented to the hospital with chest pain and shortness of breath.  Patient was recently admitted and discharged on 10/8 after being treated for CHF exacerbation.  Prior to that he has history of admission in September due to small bowel AVMs causing hemorrhagic shock requiring 5 units PRBCs. On this admission hemoglobin was found to be 5.  Gastroenterology was consulted.  Tagged RBC scan was performed but did not reveal bleeding source.  Patient was started on subcu octreotide on 11/9 with plan to continue this outpatient for an additional 28 days.  He will then follow-up with [facility] for balloon enteroscopy for and cardiology for AV repair.  Referral was faxed and arranged by gastroenterology. Discharge was delayed pending arrangement of octreotide therapy outpatient. On day of discharge care plan was discussed extensively with the patient as well as his POA [name].  She is understanding of the plan and intends to follow directly with [facility] gastroenterology and cardiology to pursue fixation of these problems.   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 The definitive treatment is to fix the aortic stenosis. Short of that we can go and try and fix the AVMs in the small bowel with the balloon enteroscopy which  needs to be performed at [facility] or a [facility]  - GI consulted this admission, no plans for endoscopy at this time - Tagged RBC scan negative - Start subcu octreotide 11/9.  Plan to continue 100 mcg 3 times daily for 28 days.  Meds to beds has arranged for a medication delivery to bedside prior to discharge.  Teaching of appropriate administration was performed.  Patient's POA, Etta, reports she is unlikely to give this medicine. Family will need to return tomorrow to receive the rest of medication. - Needs to follow-up at [facility] for AV repair and balloon enteroscopy.  Referral has been faxed to [facility]   Dyspnea, resolved - Likely angina due to acute blood loss  Acute on chronic anemia History of iron  deficiency - Hemoglobin 5.0 on arrival - Status post 4 units PRBC - Anemia workup with persistent iron  deficiency - Transfuse to keep hemoglobin above 8.  8.6 and stable for now - No bloody bowel movements in 48 hours - s/p IV iron  x 3 - Continue to monitor hemoglobin closely outpatient   History of CHF Severe aortic stenosis - Follows with Dr. Leatha outpatient. - Severe aortic stenosis likely playing a role in AVM bleeds however cardiology will not perform TAVR until GI bleed is fixed - s/p IV Lasix  during this admission - Continue with home dose furosemide  for now - Hold beta-blocker and bidil due to soft blood pressures   Severe native three-vessel CAD status post stenting July 2025 - Meant to be on aspirin  and Plavix  for 6 months - Aspirin  and Plavix  resumed 11/12 as hemoglobin has been stable - Urgently needs  AV repair   History of PAD History infrarenal aortic dissection on dual antiplatelets - Follows outpatient with vascular surgery.  Has plans for repeat vascular imaging in January   History of CVA - Continue statin   History of COPD Chronic hypoxic respiratory failure on 2 L at baseline - Currently at baseline - Continue home meds   CKD stage IIIa - Avoid  nephrotoxic medications - Renally dose when needed   Deconditioning - PT/OT while admitted.  Home health physical therapy [facility]ational [facility] at [facility]   Overweight BMI 29 -- Outpatient follow up for lifestyle modification and risk factor management    Discharge Instructions  Discharge Instructions     Ambulatory referral to Gastroenterology   Complete by: As directed    Needs [facility] STAT for small balloon enteroscopy is not performed in the [facility] system   What is the reason for referral?: Other   Call MD for:  difficulty breathing, headache or visual disturbances   Complete by: As directed    Call MD for:  persistant dizziness or light-headedness   Complete by: As directed    Call MD for:  persistant nausea and vomiting   Complete by: As directed    Call MD for:  severe uncontrolled pain   Complete by: As directed    Call MD for:  temperature >100.4   Complete by: As directed    Diet general   Complete by: As directed    Discharge instructions   Complete by: As directed    Follow up with [facility] as recommended.  Follow up with your primary care physician to discuss the medication changes during this admission   Increase activity slowly   Complete by: As directed       Allergies as of 10/30/2024       Reactions   Wellbutrin  [bupropion ]    Paranoia         Medication List     STOP taking these medications    amiodarone  200 MG tablet Commonly known as: PACERONE    ascorbic acid  500 MG tablet Commonly known as: VITAMIN C    isosorbide  mononitrate 30 MG 24 hr tablet Commonly known as: IMDUR    lactulose  10 GM/15ML solution Commonly known as: CHRONULAC    metoprolol  succinate 25 MG 24 hr tablet Commonly known as: TOPROL -XL   Mucinex  DM 30-600 MG Tb12   nicotine  7 mg/24hr patch Commonly known as: NICODERM CQ  - dosed in mg/24 hr       TAKE these medications    albuterol  108 (90 Base) MCG/ACT inhaler Commonly known as: VENTOLIN   HFA Inhale 2 puffs into the lungs every 6 (six) hours as needed for wheezing or shortness of breath.   aspirin  EC 81 MG tablet Take 1 tablet (81 mg total) by mouth daily. Swallow whole.   CertaVite/Antioxidants Tabs Take 1 tablet by mouth daily.   clopidogrel  75 MG tablet Commonly known as: PLAVIX  Take 1 tablet (75 mg total) by mouth daily with breakfast.   Farxiga  10 MG Tabs tablet Generic drug: dapagliflozin  propanediol Take 1 tablet (10 mg total) by mouth daily.   Fe Fum-Vit C-Vit B12-FA Caps capsule Commonly known as: TRIGELS-F FORTE Take 1 capsule by mouth 2 (two) times daily.   gabapentin  300 MG capsule Commonly known as: NEURONTIN  Take 1 capsule (300 mg total) by mouth 2 (two) times daily.   KP Folic Acid  1 MG tablet Generic drug: folic acid  Take 1 mg by mouth daily.   melatonin 5  MG Tabs Place 1 tablet (5 mg total) into feeding tube at bedtime as needed (sleep).   octreotide 100 MCG/ML Soln injection Commonly known as: SANDOSTATIN Inject 1 mL (100 mcg total) into the skin 3 (three) times daily for 28 days.   pantoprazole  40 MG tablet Commonly known as: Protonix  Take 1 tablet (40 mg total) by mouth daily.   polyethylene glycol powder 17 GM/SCOOP powder Commonly known as: GLYCOLAX /MIRALAX  Take 17 g by mouth at bedtime. Mix as directed.   potassium chloride  SA 20 MEQ tablet Commonly known as: KLOR-CON  M Take 20 mEq by mouth 2 (two) times daily.   rosuvastatin  20 MG tablet Commonly known as: CRESTOR  Take 1 tablet (20 mg total) by mouth daily.   thiamine  100 MG tablet Commonly known as: VITAMIN B1 Take 1 tablet (100 mg total) by mouth daily.   torsemide  20 MG tablet Commonly known as: DEMADEX  Take 1 tablet (20 mg total) by mouth daily.   Trelegy Ellipta  200-62.5-25 MCG/ACT Aepb Generic drug: Fluticasone -Umeclidin-Vilant Inhale 1 Inhalation into the lungs daily at 12 noon.   True Vitamin D3 10 MCG (400 UNIT) Caps Generic drug: Cholecalciferol  Take 2  capsules by mouth daily.   Vitamin D  (Ergocalciferol ) 1.25 MG (50000 UNIT) Caps capsule Commonly known as: DRISDOL  Take 1 capsule (50,000 Units total) by mouth every 7 (seven) days.        Follow-up Information     Osa Geralds, NP Follow up.   Specialty: Nurse Practitioner Why: hospital follow up Contact information: [address] [place] [address] [phone]                Allergies  Allergen Reactions   Wellbutrin  [Bupropion ]     Paranoia     Consultations:    Procedures/Studies: NM GI Blood Loss Result Date: 10/24/2024 EXAM: NM GI Bleeding Scan. CLINICAL HISTORY: GI bleed. TECHNIQUE: Dynamic anterior images of the abdomen and pelvis were obtained during the time of radio-labeled autologous red blood cells. Images were acquired for 60 minutes. RADIOPHARMACEUTICAL: 21.36 millicurie technetium Tc 1m-labeled red blood cells (ULTRATAG) injection kit. COMPARISON: None provided. FINDINGS: STOMACH AND BOWEL: No accumulation of tagged red blood cells within the GI tract to suggest active gastrointestinal bleeding. OTHER VISCERA: Physiologic activity noted in the solid organs, blood pool, bladder, and genitalia. IMPRESSION: 1. No active gastrointestinal bleeding identified. Electronically signed by: Norleen Boxer MD 10/24/2024 04:28 PM EST RP Workstation: [id number]   DG Chest Port 1 View Result Date: 10/24/2024 EXAM: 1 VIEW(S) XRAY OF THE CHEST 10/24/2024 10:39:00 AM COMPARISON: 09/29/2024 CLINICAL HISTORY: Dyspnea FINDINGS: LUNGS AND PLEURA: Right upper lung nodule, stable. Chronic coarsened interstitial markings. Small pleural effusions, decreased compared to prior. No pulmonary edema. No pneumothorax. HEART AND MEDIASTINUM: Cardiomegaly, unchanged. Post median sternotomy and CABG. Atherosclerotic calcifications. BONES AND SOFT TISSUES: No acute osseous abnormality. IMPRESSION: 1. Small pleural effusions, decreased compared to prior. 2. Cardiomegaly, unchanged. Post  median sternotomy and CABG. 3. Right upper lung nodule, stable. 4. Chronic coarsened interstitial markings. Electronically signed by: Katheleen Faes MD 10/24/2024 03:55 PM EST RP Workstation: [id number]   ECHOCARDIOGRAM COMPLETE Result Date: 10/22/2024    ECHOCARDIOGRAM REPORT   Patient Name:   [name] Date of Exam: 10/22/2024 Medical Rec #:  [id number]         Height:       75.0 in Accession #:    [id number]        Weight:       205.0 lb Date of Birth:  15-Jun-1961          BSA:          2.218 m Patient Age:    63 years          BP:           104/69 mmHg Patient Gender: M                 HR:           95 bpm. Exam Location:  [facility] Procedure: 2D Echo, Cardiac Doppler and Color Doppler (Both Spectral and Color            Flow Doppler were utilized during procedure). Indications:     Chest Pain R07.9  History:         Patient has prior history of Echocardiogram examinations, most                  recent 05/10/2024. CAD; Prior CABG.  Sonographer:     Ashley McNeely-Sloane Referring Phys:  [id number] [facility] Diagnosing Phys: Dwayne D Callwood MD IMPRESSIONS  1. Left ventricular ejection fraction, by estimation, is 40 to 45%. The left ventricle has mildly decreased function. The left ventricle demonstrates global hypokinesis. The left ventricular internal cavity size was moderately to severely dilated. Left ventricular diastolic parameters are consistent with Grade II diastolic dysfunction (pseudonormalization).  2. Right ventricular systolic function is low normal. The right ventricular size is mildly enlarged.  3. Left atrial size was mild to moderately dilated.  4. Right atrial size was mildly dilated.  5. The mitral valve is normal in structure. Trivial mitral valve regurgitation.  6. The aortic valve is calcified. Aortic valve regurgitation is trivial. Severe aortic valve stenosis. FINDINGS  Left Ventricle: Left ventricular ejection fraction, by estimation, is 40 to 45%. The left ventricle has mildly  decreased function. The left ventricle demonstrates global hypokinesis. Strain was performed and the global longitudinal strain is indeterminate. The left ventricular internal cavity size was moderately to severely dilated. There is borderline left ventricular hypertrophy. Left ventricular diastolic parameters are consistent with Grade II diastolic dysfunction (pseudonormalization). Right Ventricle: The right ventricular size is mildly enlarged. No increase in right ventricular wall thickness. Right ventricular systolic function is low normal. Left Atrium: Left atrial size was mild to moderately dilated. Right Atrium: Right atrial size was mildly dilated. Pericardium: There is no evidence of pericardial effusion. Mitral Valve: The mitral valve is normal in structure. Trivial mitral valve regurgitation. MV peak gradient, 8.2 mmHg. The mean mitral valve gradient is 4.0 mmHg. Tricuspid Valve: The tricuspid valve is normal in structure. Tricuspid valve regurgitation is trivial. Aortic Valve: The aortic valve is calcified. Aortic valve regurgitation is trivial. Severe aortic stenosis is present. Aortic valve mean gradient measures 25.5 mmHg. Aortic valve peak gradient measures 43.7 mmHg. Aortic valve area, by VTI measures 0.66 cm. Pulmonic Valve: The pulmonic valve was normal in structure. Pulmonic valve regurgitation is trivial. Aorta: The ascending aorta was not well visualized. IAS/Shunts: No atrial level shunt detected by color flow Doppler. Additional Comments: 3D was performed not requiring image post processing on an independent workstation and was indeterminate.  LEFT VENTRICLE PLAX 2D LVIDd:         6.10 cm   Diastology LVIDs:         4.90 cm   LV e' medial:    4.57 cm/s LV PW:         1.10 cm   LV E/e' medial:  32.8 LV  IVS:        1.10 cm   LV e' lateral:   7.72 cm/s LVOT diam:     2.40 cm   LV E/e' lateral: 19.4 LV SV:         43 LV SV Index:   19 LVOT Area:     4.52 cm  RIGHT VENTRICLE RV S prime:     5.66  cm/s TAPSE (M-mode): 1.7 cm LEFT ATRIUM           Index        RIGHT ATRIUM           Index LA diam:      4.90 cm 2.21 cm/m   RA Area:     22.90 cm LA Vol (A4C): 73.4 ml 33.10 ml/m  RA Volume:   79.00 ml  35.63 ml/m  AORTIC VALVE                     PULMONIC VALVE AV Area (Vmax):    0.76 cm      PV Vmax:       0.86 m/s AV Area (Vmean):   0.68 cm      PV Vmean:      60.800 cm/s AV Area (VTI):     0.66 cm      PV VTI:        0.147 m AV Vmax:           330.50 cm/s   PV Peak grad:  2.9 mmHg AV Vmean:          235.000 cm/s  PV Mean grad:  2.0 mmHg AV VTI:            0.653 m AV Peak Grad:      43.7 mmHg AV Mean Grad:      25.5 mmHg LVOT Vmax:         55.30 cm/s LVOT Vmean:        35.400 cm/s LVOT VTI:          0.095 m LVOT/AV VTI ratio: 0.15  AORTA Ao Root diam: 2.60 cm MITRAL VALVE MV Area (PHT): 6.17 cm     SHUNTS MV Area VTI:   1.54 cm     Systemic VTI:  0.10 m MV Peak grad:  8.2 mmHg     Systemic Diam: 2.40 cm MV Mean grad:  4.0 mmHg MV Vmax:       1.43 m/s MV Vmean:      98.6 cm/s MV Decel Time: 123 msec MV E velocity: 150.00 cm/s [name] MD Electronically signed by [name] MD Signature Date/Time: 10/22/2024/5:02:38 PM    Final       Discharge Exam: Vitals:   10/30/24 0405 10/30/24 0817  BP: (!) 107/93 109/75  Pulse: 87 86  Resp: 16 19  Temp: (!) 97.3 F (36.3 C) 97.6 F (36.4 C)  SpO2: 100% 100%   Vitals:   10/29/24 1941 10/30/24 0405 10/30/24 0500 10/30/24 0817  BP: 108/69 (!) 107/93  109/75  Pulse: 88 87  86  Resp: 16 16  19   Temp: 97.7 F (36.5 C) (!) 97.3 F (36.3 C)  97.6 F (36.4 C)  TempSrc:      SpO2: 100% 100%  100%  Weight:   104.6 kg   Height:        Constitutional:  Normal appearance. Non toxic-appearing.  HENT: Head Normocephalic and atraumatic.  Mucous membranes are moist.  Eyes:  Extraocular  intact. Conjunctivae normal.  Cardiovascular: Rate and Rhythm: Normal rate and regular rhythm.  Pulmonary: Non labored, symmetric rise of chest wall.   Skin: warm and dry. not jaundiced.  Neurological: No focal deficit present. alert. Oriented.  Psychiatric: Mood and Affect congruent.    The results of significant diagnostics from this hospitalization (including imaging, microbiology, ancillary and laboratory) are listed below for reference.     Microbiology: Recent Results (from the past 240 hours)  MRSA Next Gen by PCR, Nasal     Status: None   Collection Time: 10/25/24  5:51 PM   Specimen: Nasal Mucosa; Nasal Swab  Result Value Ref Range Status   MRSA by PCR Next Gen NOT DETECTED NOT DETECTED Final    Comment: (NOTE) The GeneXpert MRSA Assay (FDA approved for NASAL specimens only), is one component of a comprehensive MRSA colonization surveillance program. It is not intended to diagnose MRSA infection nor to guide or monitor treatment for MRSA infections. Test performance is not FDA approved in patients less than 71 years old. Performed at [facility], [address] [place], [place] [address]      Labs: BNP (last 3 results) Recent Labs    08/12/24 1457 09/16/24 0929 09/29/24 1117  BNP 1,514.9* 1,583.6* 1,168.9*   Basic Metabolic Panel: Recent Labs  Lab 10/25/24 0543 10/26/24 0550 10/27/24 0527 10/28/24 0601  NA 134* 137 138 137  K 3.4* 3.8 4.2 3.9  CL 99 102 97* 97*  CO2 23 27 26 29   GLUCOSE 130* 130* 122* 131*  BUN 21 20 16 17   CREATININE 1.45* 1.29* 1.36* 1.09  CALCIUM  8.1* 8.1* 8.3* 8.7*  MG 2.1  --   --   --    Liver Function Tests: No results for input(s): AST, ALT, ALKPHOS, BILITOT, PROT, ALBUMIN  in the last 168 hours. No results for input(s): LIPASE, AMYLASE in the last 168 hours. No results for input(s): AMMONIA in the last 168 hours. CBC: Recent Labs  Lab 10/24/24 0510 10/25/24 0544 10/28/24 1410  HGB 7.0* 8.6* 8.6*   Cardiac Enzymes: No results for input(s): CKTOTAL, CKMB, CKMBINDEX, TROPONINI in the last 168 hours. BNP: Invalid input(s):  POCBNP CBG: No results for input(s): GLUCAP in the last 168 hours. D-Dimer No results for input(s): DDIMER in the last 72 hours. Hgb A1c No results for input(s): HGBA1C in the last 72 hours. Lipid Profile No results for input(s): CHOL, HDL, LDLCALC, TRIG, CHOLHDL, LDLDIRECT in the last 72 hours. Thyroid function studies No results for input(s): TSH, T4TOTAL, T3FREE, THYROIDAB in the last 72 hours.  Invalid input(s): FREET3 Anemia work up No results for input(s): VITAMINB12, FOLATE, FERRITIN, TIBC, IRON , RETICCTPCT in the last 72 hours. Urinalysis    Component Value Date/Time   COLORURINE YELLOW (A) 09/16/2024 1202   APPEARANCEUR CLEAR (A) 09/16/2024 1202   APPEARANCEUR Clear 09/09/2014 1708   LABSPEC 1.014 09/16/2024 1202   LABSPEC 1.011 09/09/2014 1708   PHURINE 7.0 09/16/2024 1202   GLUCOSEU 150 (A) 09/16/2024 1202   GLUCOSEU Negative 09/09/2014 1708   HGBUR NEGATIVE 09/16/2024 1202   BILIRUBINUR NEGATIVE 09/16/2024 1202   BILIRUBINUR Negative 09/09/2014 1708   KETONESUR NEGATIVE 09/16/2024 1202   PROTEINUR NEGATIVE 09/16/2024 1202   NITRITE NEGATIVE 09/16/2024 1202   LEUKOCYTESUR NEGATIVE 09/16/2024 1202   LEUKOCYTESUR Negative 09/09/2014 1708   Sepsis Labs No results for input(s): WBC in the last 168 hours.  Invalid input(s): PROCALCITONIN, LACTICIDVEN Microbiology Recent Results (from the past 240 hours)  MRSA Next Gen by  PCR, Nasal     Status: None   Collection Time: 10/25/24  5:51 PM   Specimen: Nasal Mucosa; Nasal Swab  Result Value Ref Range Status   MRSA by PCR Next Gen NOT DETECTED NOT DETECTED Final    Comment: (NOTE) The GeneXpert MRSA Assay (FDA approved for NASAL specimens only), is one component of a comprehensive MRSA colonization surveillance program. It is not intended to diagnose MRSA infection nor to guide or monitor treatment for MRSA infections. Test performance is not FDA approved in patients  less than 24 years old. Performed at [facility], [address], [place], [place] [address]      Time coordinating discharge: 32 min   SIGNED: Semir Brill, DO [facility] 10/30/2024, 1:15 PM Pager   If 7PM-7AM, please contact night-coverage

## 2024-10-30 NOTE — Plan of Care (Signed)
  Problem: Health Behavior/Discharge Planning: Goal: Ability to manage health-related needs will improve Outcome: Progressing   Problem: Clinical Measurements: Goal: Diagnostic test results will improve Outcome: Progressing   Problem: Activity: Goal: Risk for activity intolerance will decrease Outcome: Progressing   Problem: Elimination: Goal: Will not experience complications related to bowel motility Outcome: Progressing   Problem: Safety: Goal: Ability to remain free from injury will improve Outcome: Progressing   Problem: Skin Integrity: Goal: Risk for impaired skin integrity will decrease Outcome: Progressing

## 2024-10-31 ENCOUNTER — Other Ambulatory Visit: Payer: Self-pay

## 2024-11-10 ENCOUNTER — Other Ambulatory Visit: Payer: Self-pay

## 2024-11-11 ENCOUNTER — Other Ambulatory Visit (HOSPITAL_COMMUNITY): Payer: Self-pay

## 2024-11-11 ENCOUNTER — Other Ambulatory Visit: Payer: Self-pay

## 2024-11-12 ENCOUNTER — Other Ambulatory Visit: Payer: Self-pay

## 2024-11-12 NOTE — Progress Notes (Signed)
 Upon arrival to floor spoke with primary nurse, Jacquline SQUIBB, RN and informed them that the GI team will be performing capsule endoscopy. A video capsule endoscopy was initiated at bedside. After verifying consent, a time out was performed with the patient. The procedure was explained to the patient and they were given the opportunity to voice concerns and ask questions. The video equipment and belt was secured to the patient prior to start of procedure.The patient tolerated swallowing of the capsule camera and video capture was confirmed on the processor and the green linked icon was present. See orders, notes, and intra-op for more details.

## 2024-11-27 ENCOUNTER — Other Ambulatory Visit (INDEPENDENT_AMBULATORY_CARE_PROVIDER_SITE_OTHER): Payer: Self-pay | Admitting: Vascular Surgery

## 2024-11-27 ENCOUNTER — Other Ambulatory Visit: Payer: Self-pay

## 2024-11-27 MED FILL — Clopidogrel Bisulfate Tab 75 MG (Base Equiv): ORAL | 30 days supply | Qty: 30 | Fill #0 | Status: CN

## 2024-12-08 ENCOUNTER — Other Ambulatory Visit: Payer: Self-pay

## 2024-12-22 ENCOUNTER — Emergency Department

## 2024-12-22 ENCOUNTER — Other Ambulatory Visit: Payer: Self-pay

## 2024-12-22 ENCOUNTER — Emergency Department
Admission: EM | Admit: 2024-12-22 | Discharge: 2024-12-22 | Disposition: A | Discharge status: Home / Self Care | Attending: Emergency Medicine | Admitting: Emergency Medicine

## 2024-12-22 DIAGNOSIS — M545 Low back pain, unspecified: Secondary | ICD-10-CM | POA: Diagnosis not present

## 2024-12-22 DIAGNOSIS — N189 Chronic kidney disease, unspecified: Secondary | ICD-10-CM | POA: Diagnosis not present

## 2024-12-22 DIAGNOSIS — R519 Headache, unspecified: Secondary | ICD-10-CM | POA: Insufficient documentation

## 2024-12-22 DIAGNOSIS — R059 Cough, unspecified: Secondary | ICD-10-CM | POA: Insufficient documentation

## 2024-12-22 DIAGNOSIS — R0789 Other chest pain: Secondary | ICD-10-CM | POA: Diagnosis not present

## 2024-12-22 DIAGNOSIS — Z951 Presence of aortocoronary bypass graft: Secondary | ICD-10-CM | POA: Insufficient documentation

## 2024-12-22 DIAGNOSIS — F0390 Unspecified dementia without behavioral disturbance: Secondary | ICD-10-CM | POA: Diagnosis not present

## 2024-12-22 DIAGNOSIS — R0602 Shortness of breath: Secondary | ICD-10-CM | POA: Diagnosis present

## 2024-12-22 DIAGNOSIS — I509 Heart failure, unspecified: Secondary | ICD-10-CM | POA: Insufficient documentation

## 2024-12-22 DIAGNOSIS — I2510 Atherosclerotic heart disease of native coronary artery without angina pectoris: Secondary | ICD-10-CM | POA: Diagnosis not present

## 2024-12-22 DIAGNOSIS — J449 Chronic obstructive pulmonary disease, unspecified: Secondary | ICD-10-CM | POA: Diagnosis not present

## 2024-12-22 DIAGNOSIS — R911 Solitary pulmonary nodule: Secondary | ICD-10-CM | POA: Insufficient documentation

## 2024-12-22 LAB — CBC WITH DIFFERENTIAL/PLATELET
Abs Immature Granulocytes: 0.06 K/uL (ref 0.00–0.07)
Basophils Absolute: 0.2 K/uL — ABNORMAL HIGH (ref 0.0–0.1)
Basophils Relative: 2 %
Eosinophils Absolute: 0.6 K/uL — ABNORMAL HIGH (ref 0.0–0.5)
Eosinophils Relative: 6 %
HCT: 35.8 % — ABNORMAL LOW (ref 39.0–52.0)
Hemoglobin: 10.5 g/dL — ABNORMAL LOW (ref 13.0–17.0)
Immature Granulocytes: 1 %
Lymphocytes Relative: 15 %
Lymphs Abs: 1.6 K/uL (ref 0.7–4.0)
MCH: 27 pg (ref 26.0–34.0)
MCHC: 29.3 g/dL — ABNORMAL LOW (ref 30.0–36.0)
MCV: 92 fL (ref 80.0–100.0)
Monocytes Absolute: 0.9 K/uL (ref 0.1–1.0)
Monocytes Relative: 8 %
Neutro Abs: 7.2 K/uL (ref 1.7–7.7)
Neutrophils Relative %: 68 %
Platelets: 243 K/uL (ref 150–400)
RBC: 3.89 MIL/uL — ABNORMAL LOW (ref 4.22–5.81)
RDW: 20.4 % — ABNORMAL HIGH (ref 11.5–15.5)
WBC: 10.5 K/uL (ref 4.0–10.5)
nRBC: 0 % (ref 0.0–0.2)

## 2024-12-22 LAB — COMPREHENSIVE METABOLIC PANEL WITH GFR
ALT: <5 U/L (ref 0–44)
AST: 22 U/L (ref 15–41)
Albumin: 4.2 g/dL (ref 3.5–5.0)
Alkaline Phosphatase: 54 U/L (ref 38–126)
Anion gap: 13 (ref 5–15)
BUN: 13 mg/dL (ref 8–23)
CO2: 23 mmol/L (ref 22–32)
Calcium: 9.6 mg/dL (ref 8.9–10.3)
Chloride: 103 mmol/L (ref 98–111)
Creatinine, Ser: 1.18 mg/dL (ref 0.61–1.24)
GFR, Estimated: >60 mL/min
Glucose, Bld: 103 mg/dL — ABNORMAL HIGH (ref 70–99)
Potassium: 4.1 mmol/L (ref 3.5–5.1)
Sodium: 139 mmol/L (ref 135–145)
Total Bilirubin: 0.7 mg/dL (ref 0.0–1.2)
Total Protein: 8.5 g/dL — ABNORMAL HIGH (ref 6.5–8.1)

## 2024-12-22 LAB — URINALYSIS, W/ REFLEX TO CULTURE (INFECTION SUSPECTED)
Bacteria, UA: NONE SEEN
Bilirubin Urine: NEGATIVE
Glucose, UA: 150 mg/dL — AB
Ketones, ur: NEGATIVE mg/dL
Leukocytes,Ua: NEGATIVE
Nitrite: NEGATIVE
Protein, ur: NEGATIVE mg/dL
Specific Gravity, Urine: 1.006 (ref 1.005–1.030)
Squamous Epithelial / HPF: 0 /HPF (ref 0–5)
pH: 6 (ref 5.0–8.0)

## 2024-12-22 LAB — PROTIME-INR
INR: 1.1 (ref 0.8–1.2)
Prothrombin Time: 14.7 s (ref 11.4–15.2)

## 2024-12-22 LAB — TROPONIN T, HIGH SENSITIVITY
Troponin T High Sensitivity: 22 ng/L — ABNORMAL HIGH (ref 0–19)
Troponin T High Sensitivity: 20 ng/L — ABNORMAL HIGH (ref 0–19)

## 2024-12-22 LAB — SAMPLE TO BLOOD BANK

## 2024-12-22 LAB — MAGNESIUM: Magnesium: 2.4 mg/dL (ref 1.7–2.4)

## 2024-12-22 LAB — PRO BRAIN NATRIURETIC PEPTIDE: Pro Brain Natriuretic Peptide: 1212 pg/mL — ABNORMAL HIGH

## 2024-12-22 MED ORDER — LIDOCAINE 5 % EX PTCH
2.0000 | MEDICATED_PATCH | CUTANEOUS | Status: DC
  Administered 2024-12-22: 2 via TRANSDERMAL
  Filled 2024-12-22: qty 2

## 2024-12-22 MED ORDER — METHOCARBAMOL 500 MG PO TABS
500.0000 mg | ORAL_TABLET | Freq: Once | ORAL | Status: AC
  Administered 2024-12-22: 500 mg via ORAL
  Filled 2024-12-22: qty 1

## 2024-12-22 MED ORDER — FUROSEMIDE 10 MG/ML IJ SOLN
20.0000 mg | Freq: Once | INTRAMUSCULAR | Status: AC
  Administered 2024-12-22: 20 mg via INTRAVENOUS
  Filled 2024-12-22: qty 4

## 2024-12-22 MED ORDER — ACETAMINOPHEN 500 MG PO TABS
1000.0000 mg | ORAL_TABLET | Freq: Once | ORAL | Status: AC
  Administered 2024-12-22: 1000 mg via ORAL
  Filled 2024-12-22: qty 2

## 2024-12-22 NOTE — ED Notes (Signed)
 Called CCMD to place pt on central monitoring

## 2024-12-22 NOTE — ED Triage Notes (Signed)
 Pt coming from home via EMS due to chest pain and SOB. Pt denies any chest at this time. Pt is on 2L of oxygen at home baseline. Pt has history of COPD and hypertension, compliant with medication. Pt reports black tarry stool episode yesterday. No acute distress noted at this time. Md Mumma at bedside.

## 2024-12-22 NOTE — Discharge Instructions (Signed)
 You are seen in the emergency department for multiple complaints of headache, shortness of breath, chest pain and burning with urination and low back pain.  You had a mildly elevated BNP and concerned that you could have some extra fluid on your lungs given your shortness of breath and was given 1 dose of IV Lasix .  You had 2 troponins that were stable.  Do not believe you are having a big heart attack today.  Call your cardiologist today to schedule close follow-up appointment.  Follow-up with your scheduled appointment for your pulmonary nodule.

## 2024-12-22 NOTE — ED Notes (Signed)
 Pt verbalizes understanding of discharge instructions. Opportunity for questioning and answers were provided. Pt discharged from ED a tt his time with [place], [place].

## 2024-12-22 NOTE — ED Provider Notes (Addendum)
 "  [facility] Provider Note    Event Date/Time   First MD Initiated Contact with Patient 12/22/24 [phone]     (approximate)   History   Shortness of Breath   HPI  [name] is a 64 y.o. male past medical history significant for CAD with prior CABG, HFpEF, COPD on chronic 2 L of oxygen, history of CVA and left carotid endarterectomy, severe aortic stenosis with TAVR on 11/18/2024 at [facility], history of alcohol  use, mild dementia, CKD, presents to the emergency department shortness of breath.  States that he started having some mild chest discomfort last night and some shortness of breath.  Today was having some pain to both of his hips and just not feeling well with a mild headache.  Endorses a mild cough.  States that he has been having ongoing black stool over the past 1 year, felt today it was more gray.  Does not believe he takes any anticoagulation.  Recent TAVR at [facility].  Denies any nausea, vomiting, fever, chills, dysuria, urinary urgency or frequency.  Denies any falls.     Physical Exam   Triage Vital Signs: ED Triage Vitals  Encounter Vitals Group     BP      Girls Systolic BP Percentile      Girls Diastolic BP Percentile      Boys Systolic BP Percentile      Boys Diastolic BP Percentile      Pulse      Resp      Temp      Temp src      SpO2      Weight      Height      Head Circumference      Peak Flow      Pain Score      Pain Loc      Pain Education      Exclude from Growth Chart     Most recent vital signs: Vitals:   12/22/24 0934 12/22/24 1357  BP: (!) 165/91 (!) 142/81  Pulse: 92 85  Resp: 19 18  Temp: 98.5 F (36.9 C) 98.7 F (37.1 C)  SpO2: 100% 97%    Physical Exam Constitutional:      Appearance: He is well-developed.  HENT:     Head: Atraumatic.  Eyes:     Conjunctiva/sclera: Conjunctivae normal.  Cardiovascular:     Rate and Rhythm: Regular rhythm.  Pulmonary:     Effort: Pulmonary effort is normal. No  respiratory distress.  Genitourinary:    Comments: No gross blood or melena Musculoskeletal:     Cervical back: Normal range of motion.     Right lower leg: No tenderness. No edema.     Left lower leg: No edema.     Comments: Full range of motion to bilateral lower extremities.  Skin:    General: Skin is warm.     Capillary Refill: Capillary refill takes less than 2 seconds.  Neurological:     General: No focal deficit present.     Mental Status: He is alert. Mental status is at baseline.     IMPRESSION / MDM / ASSESSMENT AND PLAN / ED COURSE  I reviewed the triage vital signs and the nursing notes.  Differential diagnosis including GI bleed, pneumonia, viral illness including COVID/influenza, electrolyte abnormality, anemia, heart failure exacerbation, pneumothorax, pneumonia, ACS  EKG  I, Clotilda Punter, the attending physician, personally viewed and interpreted this ECG.  EKG showed  normal sinus rhythm.  Prolonged PR interval at 207.  QRS 142, QTc 502.  No significant change from prior EKG on 22 October 2024.  No findings of acute ischemia or dysrhythmia  No tachycardic or bradycardic dysrhythmias while on cardiac telemetry.  RADIOLOGY Chest x-ray with cardiomegaly and possible trace pulmonary edema.  No focal findings consistent with pneumonia.  Persistent pulmonary nodule recommended CT scan. X-ray lumbar spine with no obvious fracture or dislocation.  Read is currently pending.  CT scan of the head not yet obtained.   LABS (all labs ordered are listed, but only abnormal results are displayed) Labs interpreted as -    Labs Reviewed  CBC WITH DIFFERENTIAL/PLATELET - Abnormal; Notable for the following components:      Result Value   RBC 3.89 (*)    Hemoglobin 10.5 (*)    HCT 35.8 (*)    MCHC 29.3 (*)    RDW 20.4 (*)    Eosinophils Absolute 0.6 (*)    Basophils Absolute 0.2 (*)    All other components within normal limits  COMPREHENSIVE METABOLIC PANEL WITH GFR  - Abnormal; Notable for the following components:   Glucose, Bld 103 (*)    Total Protein 8.5 (*)    All other components within normal limits  PRO BRAIN NATRIURETIC PEPTIDE - Abnormal; Notable for the following components:   Pro Brain Natriuretic Peptide 1,212.0 (*)    All other components within normal limits  URINALYSIS, W/ REFLEX TO CULTURE (INFECTION SUSPECTED) - Abnormal; Notable for the following components:   Color, Urine COLORLESS (*)    APPearance CLEAR (*)    Glucose, UA 150 (*)    Hgb urine dipstick MODERATE (*)    All other components within normal limits  TROPONIN T, HIGH SENSITIVITY - Abnormal; Notable for the following components:   Troponin T High Sensitivity 22 (*)    All other components within normal limits  TROPONIN T, HIGH SENSITIVITY - Abnormal; Notable for the following components:   Troponin T High Sensitivity 20 (*)    All other components within normal limits  PROTIME-INR  MAGNESIUM   SAMPLE TO BLOOD BANK     MDM  Patient's lab work with no significant leukocytosis.  Hemoglobin is 10.5 which is increased from his baseline.  It is normal at 243.  Creatinine at baseline at 1.1.  No significantly elevated BUN and have a low suspicion for an upper GI bleed.  No significant electrolyte abnormalities and normal LFTs.  Troponin mildly elevated at 22 and repeat stable.  Magnesium  within normal limits.  Mildly elevated proBNP.  Chest x-ray with stable COPD and mild cardiomegaly.  Persistent irregular nodularity to the right upper lobe.  Cannot exclude neoplasm.  Recommended CT scan of the chest for further evaluation.  On reevaluation patient states that he is otherwise feeling well.  Patient was given a dose of IV Lasix .  Remained stable on room air with no signs of respiratory distress.  No chest pain at this time.  Have low suspicion for ACS.  Patient does have a heart score of 5.  Discussed admission for further cardiac workup.  Patient expressed understanding but  states that he wants to follow-up as an outpatient with his cardiologist for repeat evaluation.  Feel that this is reasonable given that he is not having any active symptoms at this time and 2 troponins are reassuring.  Given referral for pulmonary clinic to follow-up for his nodule for outpatient CT scan.  Discussed return precautions for  any ongoing or worsening symptoms.  Discussed close follow-up with his cardiologist.  No questions or concerns at time of discharge.  3:46 PM Calling his healthcare power of attorney at time of discharge.  States that he had also been complaining of dysuria, they had discontinued his Lasix  recently given acute kidney injury and he had been complaining of headache and low back pain.  Patient already has follow-up scheduled for needle aspiration for his known pulmonary nodule.  Will add on an x-ray to his lumbar spine but does not have any focal tenderness to palpation and has no tenderness to bilateral pelvis or hips.  Patient has not had any falls or trauma.  Adding on a CT scan of the head and a urinalysis.  If these are reassuring he would like to go home and follow-up as an outpatient with his team at [facility].     PROCEDURES:  Critical Care performed: No  Procedures  Patient's presentation is most consistent with acute presentation with potential threat to life or bodily function.   MEDICATIONS ORDERED IN ED: Medications  acetaminophen  (TYLENOL ) tablet 1,000 mg (has no administration in time range)  lidocaine  (LIDODERM ) 5 % 2 patch (has no administration in time range)  methocarbamol  (ROBAXIN ) tablet 500 mg (has no administration in time range)  furosemide  (LASIX ) injection 20 mg (20 mg Intravenous Given 12/22/24 1159)    FINAL CLINICAL IMPRESSION(S) / ED DIAGNOSES   Final diagnoses:  SOB (shortness of breath)  Pulmonary nodule  Acute nonintractable headache, unspecified headache type  Low back pain without sciatica, unspecified back pain  laterality, unspecified chronicity     Rx / DC Orders   ED Discharge Orders          Ordered    AMB  Referral to Pulmonary Nodule Clinic  Status:  Canceled        12/22/24 1407             Note:  This document was prepared using Dragon voice recognition software and may include unintentional dictation errors.   Suzanne Kirsch, MD 12/22/24 1407    Suzanne Kirsch, MD 12/22/24 1423    Suzanne Kirsch, MD 12/22/24 1546  "

## 2025-01-06 ENCOUNTER — Other Ambulatory Visit (INDEPENDENT_AMBULATORY_CARE_PROVIDER_SITE_OTHER): Payer: Self-pay | Admitting: Vascular Surgery

## 2025-01-16 ENCOUNTER — Encounter (INDEPENDENT_AMBULATORY_CARE_PROVIDER_SITE_OTHER)

## 2025-01-16 ENCOUNTER — Ambulatory Visit (INDEPENDENT_AMBULATORY_CARE_PROVIDER_SITE_OTHER): Admitting: Vascular Surgery
